# Patient Record
Sex: Male | Born: 1960 | Race: White | Hispanic: No | Marital: Single | Smoking: Current some day smoker
Health system: Southern US, Community
[De-identification: ages and names within clinical notes are randomized; demographics above are authoritative.]

## PROBLEM LIST (undated history)

## (undated) ENCOUNTER — Emergency Department (HOSPITAL_COMMUNITY): Admission: EM | Payer: Self-pay | Source: Home / Self Care

## (undated) DIAGNOSIS — K219 Gastro-esophageal reflux disease without esophagitis: Secondary | ICD-10-CM

## (undated) DIAGNOSIS — E119 Type 2 diabetes mellitus without complications: Secondary | ICD-10-CM

## (undated) DIAGNOSIS — I10 Essential (primary) hypertension: Secondary | ICD-10-CM

## (undated) DIAGNOSIS — I671 Cerebral aneurysm, nonruptured: Secondary | ICD-10-CM

## (undated) DIAGNOSIS — F1910 Other psychoactive substance abuse, uncomplicated: Secondary | ICD-10-CM

## (undated) DIAGNOSIS — T148XXA Other injury of unspecified body region, initial encounter: Secondary | ICD-10-CM

## (undated) DIAGNOSIS — I499 Cardiac arrhythmia, unspecified: Secondary | ICD-10-CM

## (undated) HISTORY — PX: NECK SURGERY: SHX720

## (undated) HISTORY — PX: IMPLANTATION / PLACEMENT OF STRIP ELECTRODES VIA BURR HOLES SUBDURAL: SUR688

## (undated) HISTORY — PX: HERNIA REPAIR: SHX51

---

## 2005-10-16 ENCOUNTER — Emergency Department: Payer: Self-pay | Admitting: Unknown Physician Specialty

## 2005-12-17 ENCOUNTER — Emergency Department: Payer: Self-pay | Admitting: Emergency Medicine

## 2008-09-03 ENCOUNTER — Emergency Department: Payer: Self-pay | Admitting: Emergency Medicine

## 2009-07-08 ENCOUNTER — Inpatient Hospital Stay: Payer: Self-pay | Admitting: Student

## 2009-07-21 ENCOUNTER — Ambulatory Visit: Payer: Self-pay | Admitting: Pain Medicine

## 2009-11-25 ENCOUNTER — Emergency Department: Payer: Self-pay | Admitting: Emergency Medicine

## 2009-11-30 ENCOUNTER — Ambulatory Visit: Payer: Self-pay | Admitting: Cardiology

## 2009-11-30 ENCOUNTER — Ambulatory Visit: Payer: Self-pay | Admitting: Unknown Physician Specialty

## 2009-12-09 ENCOUNTER — Ambulatory Visit: Payer: Self-pay | Admitting: Unknown Physician Specialty

## 2010-03-25 ENCOUNTER — Inpatient Hospital Stay: Payer: Self-pay | Admitting: Internal Medicine

## 2010-04-25 ENCOUNTER — Ambulatory Visit: Payer: Self-pay | Admitting: Unknown Physician Specialty

## 2010-07-03 ENCOUNTER — Emergency Department: Payer: Self-pay | Admitting: Emergency Medicine

## 2012-03-27 DIAGNOSIS — G8929 Other chronic pain: Secondary | ICD-10-CM | POA: Insufficient documentation

## 2012-03-27 DIAGNOSIS — M25512 Pain in left shoulder: Secondary | ICD-10-CM

## 2012-03-27 DIAGNOSIS — M25511 Pain in right shoulder: Secondary | ICD-10-CM

## 2012-09-02 ENCOUNTER — Ambulatory Visit: Payer: Self-pay

## 2012-09-20 DIAGNOSIS — L720 Epidermal cyst: Secondary | ICD-10-CM | POA: Insufficient documentation

## 2012-09-20 DIAGNOSIS — K458 Other specified abdominal hernia without obstruction or gangrene: Secondary | ICD-10-CM | POA: Insufficient documentation

## 2012-12-08 ENCOUNTER — Ambulatory Visit: Payer: Self-pay

## 2012-12-12 ENCOUNTER — Ambulatory Visit: Payer: Self-pay

## 2012-12-21 ENCOUNTER — Ambulatory Visit: Payer: Self-pay | Admitting: Family Medicine

## 2013-09-12 DIAGNOSIS — M25569 Pain in unspecified knee: Secondary | ICD-10-CM | POA: Insufficient documentation

## 2013-10-16 ENCOUNTER — Ambulatory Visit: Payer: Self-pay

## 2014-07-19 DIAGNOSIS — E119 Type 2 diabetes mellitus without complications: Secondary | ICD-10-CM | POA: Insufficient documentation

## 2014-07-19 DIAGNOSIS — M79644 Pain in right finger(s): Secondary | ICD-10-CM | POA: Insufficient documentation

## 2014-07-19 DIAGNOSIS — M5417 Radiculopathy, lumbosacral region: Secondary | ICD-10-CM | POA: Insufficient documentation

## 2015-03-25 ENCOUNTER — Emergency Department
Admission: EM | Admit: 2015-03-25 | Discharge: 2015-03-25 | Disposition: A | Discharge status: Home / Self Care | Payer: Medicaid Other | Attending: Emergency Medicine | Admitting: Emergency Medicine

## 2015-03-25 ENCOUNTER — Encounter: Payer: Self-pay | Admitting: Emergency Medicine

## 2015-03-25 DIAGNOSIS — Z720 Tobacco use: Secondary | ICD-10-CM | POA: Insufficient documentation

## 2015-03-25 DIAGNOSIS — Z79899 Other long term (current) drug therapy: Secondary | ICD-10-CM | POA: Insufficient documentation

## 2015-03-25 DIAGNOSIS — Z794 Long term (current) use of insulin: Secondary | ICD-10-CM | POA: Insufficient documentation

## 2015-03-25 DIAGNOSIS — E1165 Type 2 diabetes mellitus with hyperglycemia: Secondary | ICD-10-CM | POA: Insufficient documentation

## 2015-03-25 DIAGNOSIS — R739 Hyperglycemia, unspecified: Secondary | ICD-10-CM

## 2015-03-25 DIAGNOSIS — I10 Essential (primary) hypertension: Secondary | ICD-10-CM | POA: Insufficient documentation

## 2015-03-25 DIAGNOSIS — R5381 Other malaise: Secondary | ICD-10-CM | POA: Insufficient documentation

## 2015-03-25 HISTORY — DX: Type 2 diabetes mellitus without complications: E11.9

## 2015-03-25 HISTORY — DX: Essential (primary) hypertension: I10

## 2015-03-25 HISTORY — DX: Cerebral aneurysm, nonruptured: I67.1

## 2015-03-25 LAB — CBC
HCT: 47.4 % (ref 40.0–52.0)
HEMOGLOBIN: 15.7 g/dL (ref 13.0–18.0)
MCH: 31.2 pg (ref 26.0–34.0)
MCHC: 33 g/dL (ref 32.0–36.0)
MCV: 94.5 fL (ref 80.0–100.0)
Platelets: 284 10*3/uL (ref 150–440)
RBC: 5.02 MIL/uL (ref 4.40–5.90)
RDW: 13.5 % (ref 11.5–14.5)
WBC: 7.1 10*3/uL (ref 3.8–10.6)

## 2015-03-25 LAB — URINALYSIS COMPLETE WITH MICROSCOPIC (ARMC ONLY)
BILIRUBIN URINE: NEGATIVE
Bacteria, UA: NONE SEEN
Glucose, UA: >500 mg/dL — AB
Hgb urine dipstick: NEGATIVE
Leukocytes, UA: NEGATIVE
Nitrite: NEGATIVE
PROTEIN: NEGATIVE mg/dL
SQUAMOUS EPITHELIAL / LPF: NONE SEEN
Specific Gravity, Urine: 1.025 (ref 1.005–1.030)
pH: 5 (ref 5.0–8.0)

## 2015-03-25 LAB — COMPREHENSIVE METABOLIC PANEL
ALBUMIN: 4.3 g/dL (ref 3.5–5.0)
ALK PHOS: 117 U/L (ref 38–126)
ALT: 18 U/L (ref 17–63)
AST: 20 U/L (ref 15–41)
Anion gap: 13 (ref 5–15)
BILIRUBIN TOTAL: 0.7 mg/dL (ref 0.3–1.2)
BUN: 30 mg/dL — ABNORMAL HIGH (ref 6–20)
CO2: 22 mmol/L (ref 22–32)
Calcium: 9.6 mg/dL (ref 8.9–10.3)
Chloride: 96 mmol/L — ABNORMAL LOW (ref 101–111)
Creatinine, Ser: 1.34 mg/dL — ABNORMAL HIGH (ref 0.61–1.24)
GFR calc Af Amer: >60 mL/min (ref >60)
GFR calc non Af Amer: 58 mL/min — ABNORMAL LOW (ref >60)
Glucose, Bld: 696 mg/dL (ref 65–99)
Potassium: 5 mmol/L (ref 3.5–5.1)
Sodium: 131 mmol/L — ABNORMAL LOW (ref 135–145)
Total Protein: 7.2 g/dL (ref 6.5–8.1)

## 2015-03-25 LAB — GLUCOSE, CAPILLARY
GLUCOSE-CAPILLARY: 169 mg/dL — AB (ref 65–99)
GLUCOSE-CAPILLARY: 566 mg/dL — AB (ref 65–99)
Glucose-Capillary: >600 mg/dL (ref 65–99)
Glucose-Capillary: 167 mg/dL — ABNORMAL HIGH (ref 65–99)
Glucose-Capillary: 222 mg/dL — ABNORMAL HIGH (ref 65–99)

## 2015-03-25 MED ORDER — INSULIN ASPART 100 UNIT/ML ~~LOC~~ SOLN
15.0000 [IU] | Freq: Once | SUBCUTANEOUS | Status: AC
  Administered 2015-03-25: 15 [IU] via INTRAVENOUS

## 2015-03-25 MED ORDER — IBUPROFEN 600 MG PO TABS: 600.0000 mg | ORAL_TABLET | Freq: Three times a day (TID) | ORAL | Status: DC | PRN

## 2015-03-25 MED ORDER — INSULIN ASPART 100 UNIT/ML ~~LOC~~ SOLN
SUBCUTANEOUS | Status: AC
  Administered 2015-03-25: 15 [IU] via INTRAVENOUS
  Filled 2015-03-25: qty 15

## 2015-03-25 MED ORDER — SODIUM CHLORIDE 0.9 % IV BOLUS (SEPSIS)
1000.0000 mL | Freq: Once | INTRAVENOUS | Status: AC
  Administered 2015-03-25: 1000 mL via INTRAVENOUS

## 2015-03-25 NOTE — ED Notes (Signed)
Pt reports bs over 600 for several weeks.  States then he went to jail and didn't follow up.  bs still high

## 2015-03-25 NOTE — Discharge Instructions (Signed)
Please seek medical attention for any high fevers, chest pain, shortness of breath, change in behavior, persistent vomiting, bloody stool or any other new or concerning symptoms. ° °Hyperglycemia °Hyperglycemia occurs when the glucose (sugar) in your blood is too high. Hyperglycemia can happen for many reasons, but it most often happens to people who do not know they have diabetes or are not managing their diabetes properly.  °CAUSES  °Whether you have diabetes or not, there are other causes of hyperglycemia. Hyperglycemia can occur when you have diabetes, but it can also occur in other situations that you might not be as aware of, such as: °Diabetes °· If you have diabetes and are having problems controlling your blood glucose, hyperglycemia could occur because of some of the following reasons: °¨ Not following your meal plan. °¨ Not taking your diabetes medications or not taking it properly. °¨ Exercising less or doing less activity than you normally do. °¨ Being sick. °Pre-diabetes °· This cannot be ignored. Before people develop Type 2 diabetes, they almost always have "pre-diabetes." This is when your blood glucose levels are higher than normal, but not yet high enough to be diagnosed as diabetes. Research has shown that some long-term damage to the body, especially the heart and circulatory system, may already be occurring during pre-diabetes. If you take action to manage your blood glucose when you have pre-diabetes, you may delay or prevent Type 2 diabetes from developing. °Stress °· If you have diabetes, you may be "diet" controlled or on oral medications or insulin to control your diabetes. However, you may find that your blood glucose is higher than usual in the hospital whether you have diabetes or not. This is often referred to as "stress hyperglycemia." Stress can elevate your blood glucose. This happens because of hormones put out by the body during times of stress. If stress has been the cause of  your high blood glucose, it can be followed regularly by your caregiver. That way he/she can make sure your hyperglycemia does not continue to get worse or progress to diabetes. °Steroids °· Steroids are medications that act on the infection fighting system (immune system) to block inflammation or infection. One side effect can be a rise in blood glucose. Most people can produce enough extra insulin to allow for this rise, but for those who cannot, steroids make blood glucose levels go even higher. It is not unusual for steroid treatments to "uncover" diabetes that is developing. It is not always possible to determine if the hyperglycemia will go away after the steroids are stopped. A special blood test called an A1c is sometimes done to determine if your blood glucose was elevated before the steroids were started. °SYMPTOMS °· Thirsty. °· Frequent urination. °· Dry mouth. °· Blurred vision. °· Tired or fatigue. °· Weakness. °· Sleepy. °· Tingling in feet or leg. °DIAGNOSIS  °Diagnosis is made by monitoring blood glucose in one or all of the following ways: °· A1c test. This is a chemical found in your blood. °· Fingerstick blood glucose monitoring. °· Laboratory results. °TREATMENT  °First, knowing the cause of the hyperglycemia is important before the hyperglycemia can be treated. Treatment may include, but is not be limited to: °· Education. °· Change or adjustment in medications. °· Change or adjustment in meal plan. °· Treatment for an illness, infection, etc. °· More frequent blood glucose monitoring. °· Change in exercise plan. °· Decreasing or stopping steroids. °· Lifestyle changes. °HOME CARE INSTRUCTIONS  °· Test your blood glucose   as directed. °· Exercise regularly. Your caregiver will give you instructions about exercise. Pre-diabetes or diabetes which comes on with stress is helped by exercising. °· Eat wholesome, balanced meals. Eat often and at regular, fixed times. Your caregiver or nutritionist  will give you a meal plan to guide your sugar intake. °· Being at an ideal weight is important. If needed, losing as little as 10 to 15 pounds may help improve blood glucose levels. °SEEK MEDICAL CARE IF:  °· You have questions about medicine, activity, or diet. °· You continue to have symptoms (problems such as increased thirst, urination, or weight gain). °SEEK IMMEDIATE MEDICAL CARE IF:  °· You are vomiting or have diarrhea. °· Your breath smells fruity. °· You are breathing faster or slower. °· You are very sleepy or incoherent. °· You have numbness, tingling, or pain in your feet or hands. °· You have chest pain. °· Your symptoms get worse even though you have been following your caregiver's orders. °· If you have any other questions or concerns. °Document Released: 04/18/2001 Document Revised: 01/15/2012 Document Reviewed: 02/19/2012 °[employer]® Patient Information ©2015 [employer]. This information is not intended to replace advice given to you by your health care provider. Make sure you discuss any questions you have with your health care provider. ° °

## 2015-03-25 NOTE — ED Provider Notes (Signed)
[facility] Emergency Department Provider Note    ____________________________________________  Time seen: 1015  I have reviewed the triage vital signs and the nursing notes.   HISTORY  Chief Complaint Hyperglycemia   History limited by: Not Limited   HPI Spencer [name] is a 54 y.o. male who presents to the emergency department with concerns for hyperglycemia. Patient states his blood sugars have been over 600 for the past 2-3 weeks. Patient states he has a history of heart control blood sugar and has been hospitalized in the past for elevated blood sugar. He states he is currently taking 52 units of Humalog both morning and night for his on top of this patient states he started to have some blurry vision and started feeling unwell. He had subjective fevers.     Past Medical History  Diagnosis Date  . Diabetes mellitus without complication   . Hypertension   . Brain aneurysm     There are no active problems to display for this patient.   Past Surgical History  Procedure Laterality Date  . Neck surgery    . Hernia repair      Current Outpatient Rx  Name  Route  Sig  Dispense  Refill  . hydrochlorothiazide (MICROZIDE) 12.5 MG capsule   Oral   Take 1 capsule by mouth daily.         . insulin aspart (NOVOLOG) 100 UNIT/ML FlexPen   Subcutaneous   Inject into the skin.         Marland Kitchen. insulin glargine (LANTUS) 100 UNIT/ML injection   Subcutaneous   Inject 52 Units into the skin 2 (two) times daily. 50 units subcutaneously twice a day         . lisinopril (PRINIVIL,ZESTRIL) 20 MG tablet   Oral   Take 1 tablet by mouth daily.         . traMADol (ULTRAM) 50 MG tablet      Take 1 or 2 tabs by mouth q6 hrs prn         . ibuprofen (ADVIL,MOTRIN) 600 MG tablet   Oral   Take 1 tablet (600 mg total) by mouth every 8 (eight) hours as needed.   20 tablet   0     Allergies Review of patient's allergies indicates no known  allergies.  History reviewed. No pertinent family history.  Social History History  Substance Use Topics  . Smoking status: Current Every Day Smoker  . Smokeless tobacco: Not on file  . Alcohol Use: Yes    Review of Systems  Constitutional: Subjective fever Cardiovascular: Negative for chest pain. Respiratory: Negative for shortness of breath. Gastrointestinal: Negative for abdominal pain, vomiting and diarrhea. Genitourinary: Negative for dysuria. Musculoskeletal: Negative for back pain. Skin: Negative for rash. Neurological: Negative for headaches, focal weakness or numbness.   10-point ROS otherwise negative.  ____________________________________________   PHYSICAL EXAM:  VITAL SIGNS: ED Triage Vitals  Enc Vitals Group     BP 03/25/15 0957 137/81 mmHg     Pulse Rate 03/25/15 0957 77     Resp 03/25/15 0957 20     Temp 03/25/15 0957 98.3 F (36.8 C)     Temp Source 03/25/15 0957 Oral     SpO2 03/25/15 0957 98 %     Weight 03/25/15 0957 175 lb (79.379 kg)     Height 03/25/15 0957 6' (1.829 m)   Constitutional: Alert and oriented. Well appearing and in no distress. Eyes: Conjunctivae are normal. PERRL. Normal extraocular  movements. ENT   Head: Normocephalic and atraumatic.   Nose: No congestion/rhinnorhea.   Mouth/Throat: Mucous membranes are moist.   Neck: No stridor. Hematological/Lymphatic/Immunilogical: No cervical lymphadenopathy. Cardiovascular: Normal rate, regular rhythm.  No murmurs, rubs, or gallops. Respiratory: Normal respiratory effort without tachypnea nor retractions. Breath sounds are clear and equal bilaterally. No wheezes/rales/rhonchi. Gastrointestinal: Soft and nontender. No distention. There is no CVA tenderness. Genitourinary: Deferred Musculoskeletal: Normal range of motion in all extremities. No joint effusions.  No lower extremity tenderness nor edema. Neurologic:  Normal speech and language. No gross focal neurologic  deficits are appreciated. Speech is normal.  Skin:  Skin is warm, dry and intact. No rash noted. Psychiatric: Mood and affect are normal. Speech and behavior are normal. Patient exhibits appropriate insight and judgment.  ____________________________________________    LABS (pertinent positives/negatives)  Labs Reviewed  COMPREHENSIVE METABOLIC PANEL - Abnormal; Notable for the following:    Sodium 131 (*)    Chloride 96 (*)    Glucose, Bld 696 (*)    BUN 30 (*)    Creatinine, Ser 1.34 (*)    GFR calc non Af Amer 58 (*)    All other components within normal limits  URINALYSIS COMPLETEWITH MICROSCOPIC ([facility])  - Abnormal; Notable for the following:    Color, Urine STRAW (*)    APPearance CLEAR (*)    Glucose, UA >500 (*)    Ketones, ur 1+ (*)    All other components within normal limits  GLUCOSE, CAPILLARY - Abnormal; Notable for the following:    Glucose-Capillary >600 (*)    All other components within normal limits  GLUCOSE, CAPILLARY - Abnormal; Notable for the following:    Glucose-Capillary >600 (*)    All other components within normal limits  GLUCOSE, CAPILLARY - Abnormal; Notable for the following:    Glucose-Capillary 167 (*)    All other components within normal limits  GLUCOSE, CAPILLARY - Abnormal; Notable for the following:    Glucose-Capillary 169 (*)    All other components within normal limits  GLUCOSE, CAPILLARY - Abnormal; Notable for the following:    Glucose-Capillary 222 (*)    All other components within normal limits  CBC  CBG MONITORING, ED     ____________________________________________   EKG  None  ____________________________________________    RADIOLOGY  None  ____________________________________________   PROCEDURES  Procedure(s) performed: None  Critical Care performed: No  ____________________________________________   INITIAL IMPRESSION / ASSESSMENT AND PLAN / ED COURSE  Pertinent labs & imaging results that  were available during my care of the patient were reviewed by me and considered in my medical decision making (see chart for details).  Patient here with concerns for hyperglycemia. Will check blood work, make sure patient not in DKA. Will give fluids and if potassium allows IV insulin.  ----------------------------------------- 3:29 PM on 03/25/2015 -----------------------------------------  Should sugars have now been in a much more acceptable range. No anion gap. Patient does state he feels better now that his sugars are low. This point will plan on discharge patient home. Encourage primary care follow-up. discussedreturn precautions.  ____________________________________________   FINAL CLINICAL IMPRESSION(S) / ED DIAGNOSES  Final diagnoses:  Hyperglycemia     Phineas SemenGraydon Jillien Yakel, MD 03/25/15 1530

## 2015-03-25 NOTE — ED Notes (Signed)
Pt discharged home after verbalizing understanding of discharge instructions; nad noted. 

## 2015-03-25 NOTE — ED Notes (Signed)
Pt presents to ED with elevated blood sugar. Pt states he was seen in this ED earlier today for the same and was treated with fluids and released. Pt reports his sugar has never been under control since he was diagnosed but has been worse the past 3 weeks. Has not been able to make an appt with his pcp. Pt alert and talkative. States him MD told him is body "seems to be rejecting all his insulin". No increased work of breathing or acute distress noted at this time.

## 2015-03-26 ENCOUNTER — Emergency Department
Admission: EM | Admit: 2015-03-26 | Discharge: 2015-03-26 | Disposition: A | Discharge status: Home / Self Care | Payer: Medicaid Other | Source: Home / Self Care | Attending: Emergency Medicine | Admitting: Emergency Medicine

## 2015-03-26 DIAGNOSIS — E1165 Type 2 diabetes mellitus with hyperglycemia: Secondary | ICD-10-CM

## 2015-03-26 DIAGNOSIS — R739 Hyperglycemia, unspecified: Secondary | ICD-10-CM

## 2015-03-26 LAB — URINALYSIS COMPLETE WITH MICROSCOPIC (ARMC ONLY)
Bacteria, UA: NONE SEEN
Bilirubin Urine: NEGATIVE
Glucose, UA: >500 mg/dL — AB
Hgb urine dipstick: NEGATIVE
Leukocytes, UA: NEGATIVE
NITRITE: NEGATIVE
PROTEIN: NEGATIVE mg/dL
SQUAMOUS EPITHELIAL / LPF: NONE SEEN
Specific Gravity, Urine: 1.025 (ref 1.005–1.030)
WBC, UA: NONE SEEN WBC/hpf (ref 0–5)
pH: 6 (ref 5.0–8.0)

## 2015-03-26 LAB — COMPREHENSIVE METABOLIC PANEL
ALT: 16 U/L — ABNORMAL LOW (ref 17–63)
ANION GAP: 10 (ref 5–15)
AST: 20 U/L (ref 15–41)
Albumin: 3.4 g/dL — ABNORMAL LOW (ref 3.5–5.0)
Alkaline Phosphatase: 90 U/L (ref 38–126)
BUN: 27 mg/dL — AB (ref 6–20)
CO2: 21 mmol/L — ABNORMAL LOW (ref 22–32)
Calcium: 8.5 mg/dL — ABNORMAL LOW (ref 8.9–10.3)
Chloride: 103 mmol/L (ref 101–111)
Creatinine, Ser: 1.24 mg/dL (ref 0.61–1.24)
GFR calc Af Amer: >60 mL/min (ref >60)
GFR calc non Af Amer: >60 mL/min (ref >60)
Glucose, Bld: 502 mg/dL (ref 65–99)
POTASSIUM: 4.4 mmol/L (ref 3.5–5.1)
Sodium: 134 mmol/L — ABNORMAL LOW (ref 135–145)
Total Bilirubin: 0.4 mg/dL (ref 0.3–1.2)
Total Protein: 5.8 g/dL — ABNORMAL LOW (ref 6.5–8.1)

## 2015-03-26 LAB — GLUCOSE, CAPILLARY: GLUCOSE-CAPILLARY: 347 mg/dL — AB (ref 65–99)

## 2015-03-26 LAB — CBC WITH DIFFERENTIAL/PLATELET
Basophils Absolute: 0 10*3/uL (ref 0–0.1)
Basophils Relative: 1 %
Eosinophils Absolute: 0.1 10*3/uL (ref 0–0.7)
Eosinophils Relative: 2 %
HEMATOCRIT: 41.4 % (ref 40.0–52.0)
Hemoglobin: 14.2 g/dL (ref 13.0–18.0)
Lymphocytes Relative: 23 %
Lymphs Abs: 1.6 10*3/uL (ref 1.0–3.6)
MCH: 32.1 pg (ref 26.0–34.0)
MCHC: 34.3 g/dL (ref 32.0–36.0)
MCV: 93.4 fL (ref 80.0–100.0)
MONO ABS: 0.6 10*3/uL (ref 0.2–1.0)
Monocytes Relative: 8 %
NEUTROS PCT: 66 %
Neutro Abs: 4.8 10*3/uL (ref 1.4–6.5)
Platelets: 242 10*3/uL (ref 150–440)
RBC: 4.44 MIL/uL (ref 4.40–5.90)
RDW: 13.4 % (ref 11.5–14.5)
WBC: 7.2 10*3/uL (ref 3.8–10.6)

## 2015-03-26 MED ORDER — SODIUM CHLORIDE 0.9 % IV BOLUS (SEPSIS)
1000.0000 mL | Freq: Once | INTRAVENOUS | Status: AC
  Administered 2015-03-26: 1000 mL via INTRAVENOUS

## 2015-03-26 MED ORDER — INSULIN ASPART 100 UNIT/ML ~~LOC~~ SOLN
10.0000 [IU] | Freq: Once | SUBCUTANEOUS | Status: AC
  Administered 2015-03-26: 10 [IU] via SUBCUTANEOUS

## 2015-03-26 MED ORDER — SITAGLIPTIN PHOSPHATE 100 MG PO TABS: 100.0000 mg | ORAL_TABLET | Freq: Every day | ORAL | Status: DC

## 2015-03-26 MED ORDER — INSULIN ASPART 100 UNIT/ML ~~LOC~~ SOLN
SUBCUTANEOUS | Status: AC
  Filled 2015-03-26: qty 10

## 2015-03-26 NOTE — ED Provider Notes (Signed)
[facility] Emergency Department Provider Note  ____________________________________________  Time seen: Approximately 1:49 AM  I have reviewed the triage vital signs and the nursing notes.   HISTORY  Chief Complaint Hyperglycemia    HPI Spencer [name] is a 54 y.o. male who returns to the ED after his visit yesterday morning for persistent hyperglycemia. Patient has poorly controlled blood sugars at baseline, worse over the past 2-3 weeks. States yesterday he took over 100 units of insulin which did not control his blood sugars. Admits he is supposed to be taking metformin; however, he discontinued this several months ago due to the side effect of erectile dysfunction. Patient complains of generalized malaise, blurry vision, polydipsia and polyuria when his sugars are high. Patient denies fever, chills, chest pain, shortness of breath, abdominal pain, nausea, vomiting, diarrhea, headache, numbness, tingling, numbness.    Past Medical History  Diagnosis Date  . Diabetes mellitus without complication   . Hypertension   . Brain aneurysm     There are no active problems to display for this patient.   Past Surgical History  Procedure Laterality Date  . Neck surgery    . Hernia repair      Current Outpatient Rx  Name  Route  Sig  Dispense  Refill  . hydrochlorothiazide (MICROZIDE) 12.5 MG capsule   Oral   Take 1 capsule by mouth daily.         Marland Kitchen. ibuprofen (ADVIL,MOTRIN) 600 MG tablet   Oral   Take 1 tablet (600 mg total) by mouth every 8 (eight) hours as needed.   20 tablet   0   . insulin aspart (NOVOLOG) 100 UNIT/ML FlexPen   Subcutaneous   Inject into the skin.         Marland Kitchen. insulin glargine (LANTUS) 100 UNIT/ML injection   Subcutaneous   Inject 52 Units into the skin 2 (two) times daily. 50 units subcutaneously twice a day         . lisinopril (PRINIVIL,ZESTRIL) 20 MG tablet   Oral   Take 1 tablet by mouth daily.         . sitaGLIPtin  (JANUVIA) 100 MG tablet   Oral   Take 1 tablet (100 mg total) by mouth daily.   30 tablet   0   . traMADol (ULTRAM) 50 MG tablet      Take 1 or 2 tabs by mouth q6 hrs prn           Allergies Review of patient's allergies indicates no known allergies.  No family history on file.  Social History History  Substance Use Topics  . Smoking status: Current Every Day Smoker -- 0.50 packs/day    Types: Cigarettes  . Smokeless tobacco: Not on file  . Alcohol Use: Yes    Review of Systems Constitutional: No fever/chills Eyes: Positive for blurry vision. ENT: No sore throat. Cardiovascular: Denies chest pain. Respiratory: Denies shortness of breath. Gastrointestinal: No abdominal pain.  No nausea, no vomiting.  No diarrhea.  No constipation. Genitourinary: Negative for dysuria. Positive for polyuria. Musculoskeletal: Negative for back pain. Skin: Negative for rash. Neurological: Negative for headaches, focal weakness or numbness.  10-point ROS otherwise negative.  ____________________________________________   PHYSICAL EXAM:  VITAL SIGNS: ED Triage Vitals  Enc Vitals Group     BP 03/25/15 2222 134/77 mmHg     Pulse Rate 03/25/15 2222 84     Resp 03/25/15 2222 20     Temp 03/25/15 2222 97.5 F (  36.4 C)     Temp Source 03/25/15 2222 Oral     SpO2 03/25/15 2222 99 %     Weight 03/25/15 2222 180 lb (81.647 kg)     Height 03/25/15 2222 6' (1.829 m)     Head Cir --      Peak Flow --      Pain Score 03/25/15 2229 10     Pain Loc --      Pain Edu? --      Excl. in GC? --     Constitutional: Alert and oriented. Well appearing and in no acute distress. Eyes: Conjunctivae are normal. PERRL. EOMI. Head: Atraumatic. Nose: No congestion/rhinnorhea. Mouth/Throat: Mucous membranes are moist.  Oropharynx non-erythematous. Neck: No stridor.   Cardiovascular: Normal rate, regular rhythm. Grossly normal heart sounds.  Good peripheral circulation. Respiratory: Normal  respiratory effort.  No retractions. Lungs CTAB. Gastrointestinal: Soft and nontender. No distention. No abdominal bruits. No CVA tenderness. Musculoskeletal: No lower extremity tenderness nor edema.  No joint effusions. Neurologic:  Normal speech and language. No gross focal neurologic deficits are appreciated. Speech is normal. No gait instability. Skin:  Skin is warm, dry and intact. No rash noted. Psychiatric: Mood and affect are normal. Speech and behavior are normal.  ____________________________________________   LABS (all labs ordered are listed, but only abnormal results are displayed)  Labs Reviewed  GLUCOSE, CAPILLARY - Abnormal; Notable for the following:    Glucose-Capillary 566 (*)    All other components within normal limits  COMPREHENSIVE METABOLIC PANEL - Abnormal; Notable for the following:    Sodium 134 (*)    CO2 21 (*)    Glucose, Bld 502 (*)    BUN 27 (*)    Calcium 8.5 (*)    Total Protein 5.8 (*)    Albumin 3.4 (*)    ALT 16 (*)    All other components within normal limits  URINALYSIS COMPLETEWITH MICROSCOPIC ([facility])  - Abnormal; Notable for the following:    Color, Urine COLORLESS (*)    APPearance CLEAR (*)    Glucose, UA >500 (*)    Ketones, ur 1+ (*)    All other components within normal limits  GLUCOSE, CAPILLARY - Abnormal; Notable for the following:    Glucose-Capillary 347 (*)    All other components within normal limits  CBC WITH DIFFERENTIAL/PLATELET  CBG MONITORING, ED   ____________________________________________  EKG  None ____________________________________________  RADIOLOGY  None ____________________________________________   PROCEDURES  Procedure(s) performed: None  Critical Care performed: No  ____________________________________________   INITIAL IMPRESSION / ASSESSMENT AND PLAN / ED COURSE  Pertinent labs & imaging results that were available during my care of the patient were reviewed by me and considered  in my medical decision making (see chart for details).  54 year old male who returns to the ED for persistent hyperglycemia. He was treated yesterday morning with IV fluids and regular insulin. Patient did not take additional insulin after discharge from the ED. Will continue IV fluids, subcutaneous regular insulin. Will speak with hospitalist regarding recommendations for continued diabetes care.  ----------------------------------------- 4:06 AM on 03/26/2015 -----------------------------------------  Blood sugar improved. Patient resting in no acute distress. Discussed with Dr. Sheryle Hailiamond; will initiate Januvia 100 mg daily. No evidence of DKA. Will refer patient to endocrinology. Strict return precautions given. Patient verbalizes understanding and agree with plan of care. ____________________________________________   FINAL CLINICAL IMPRESSION(S) / ED DIAGNOSES  Final diagnoses:  Hyperglycemia  Type 2 diabetes mellitus with hyperglycemia  Irean Hong, MD 03/26/15 [phone]

## 2015-03-26 NOTE — ED Notes (Signed)
Pt reports being d/c'd from this ED "a few hours ago." Pt reports his BS read "high" shortly after he got home. Pt states " Ya'll should not have sent me home.". Pt denies taking any insulin to cover his hyperglycemia.

## 2015-03-26 NOTE — ED Notes (Signed)
Pt in bed with eyes closed, no distress noted, no needs identified at this time

## 2015-03-26 NOTE — Discharge Instructions (Signed)
1. Start Januvia 100 mg by mouth daily. 2. Continue insulin as directed by your doctor. 3. Return to the ER for worsening symptoms, persistent vomiting, difficulty breathing or other concerns.  High Blood Sugar High blood sugar (hyperglycemia) means that the level of sugar in your blood is higher than it should be. Signs of high blood sugar include:  Feeling thirsty.  Frequent peeing (urinating).  Feeling tired or sleepy.  Dry mouth.  Vision changes.  Feeling weak.  Feeling hungry but losing weight.  Numbness and tingling in your hands or feet.  Headache. When you ignore these signs, your blood sugar may keep going up. These problems may get worse, and other problems may begin. HOME CARE  Check your blood sugars as told by your doctor. Write down the numbers with the date and time.  Take the right amount of insulin or diabetes pills at the right time. Write down the dose with date and time.  Refill your insulin or diabetes pills before running out.  Watch what you eat. Follow your meal plan.  Drink liquids without sugar, such as water. Check with your doctor if you have kidney or heart disease.  Follow your doctor's orders for exercise. Exercise at the same time of day.  Keep your doctor's appointments. GET HELP RIGHT AWAY IF:   You have trouble thinking or are confused.  You have fast breathing with fruity smelling breath.  You pass out (faint).  You have 2 to 3 days of high blood sugars and you do not know why.  You have chest pain.  You are feeling sick to your stomach (nauseous) or throwing up (vomiting).  You have sudden vision changes. MAKE SURE YOU:   Understand these instructions.  Will watch your condition.  Will get help right away if you are not doing well or get worse. Document Released: 08/20/2009 Document Revised: 01/15/2012 Document Reviewed: 08/20/2009 [facility] Patient Information 2015 [place], [place]. This information is not intended to  replace advice given to you by your health care provider. Make sure you discuss any questions you have with your health care provider.  Diabetes Mellitus and Food It is important for you to manage your blood sugar (glucose) level. Your blood glucose level can be greatly affected by what you eat. Eating healthier foods in the appropriate amounts throughout the day at about the same time each day will help you control your blood glucose level. It can also help slow or prevent worsening of your diabetes mellitus. Healthy eating may even help you improve the level of your blood pressure and reach or maintain a healthy weight.  HOW CAN FOOD AFFECT ME? Carbohydrates Carbohydrates affect your blood glucose level more than any other type of food. Your dietitian will help you determine how many carbohydrates to eat at each meal and teach you how to count carbohydrates. Counting carbohydrates is important to keep your blood glucose at a healthy level, especially if you are using insulin or taking certain medicines for diabetes mellitus. Alcohol Alcohol can cause sudden decreases in blood glucose (hypoglycemia), especially if you use insulin or take certain medicines for diabetes mellitus. Hypoglycemia can be a life-threatening condition. Symptoms of hypoglycemia (sleepiness, dizziness, and disorientation) are similar to symptoms of having too much alcohol.  If your health care provider has given you approval to drink alcohol, do so in moderation and use the following guidelines:  Women should not have more than one drink per day, and men should not have more than two  drinks per day. One drink is equal to:  12 oz of beer.  5 oz of wine.  1 oz of hard liquor.  Do not drink on an empty stomach.  Keep yourself hydrated. Have water, diet soda, or unsweetened iced tea.  Regular soda, juice, and other mixers might contain a lot of carbohydrates and should be counted. WHAT FOODS ARE NOT RECOMMENDED? As you  make food choices, it is important to remember that all foods are not the same. Some foods have fewer nutrients per serving than other foods, even though they might have the same number of calories or carbohydrates. It is difficult to get your body what it needs when you eat foods with fewer nutrients. Examples of foods that you should avoid that are high in calories and carbohydrates but low in nutrients include:  Trans fats (most processed foods list trans fats on the Nutrition Facts label).  Regular soda.  Juice.  Candy.  Sweets, such as cake, pie, doughnuts, and cookies.  Fried foods. WHAT FOODS CAN I EAT? Have nutrient-rich foods, which will nourish your body and keep you healthy. The food you should eat also will depend on several factors, including:  The calories you need.  The medicines you take.  Your weight.  Your blood glucose level.  Your blood pressure level.  Your cholesterol level. You also should eat a variety of foods, including:  Protein, such as meat, poultry, fish, tofu, nuts, and seeds (lean animal proteins are best).  Fruits.  Vegetables.  Dairy products, such as milk, cheese, and yogurt (low fat is best).  Breads, grains, pasta, cereal, rice, and beans.  Fats such as olive oil, trans fat-free margarine, canola oil, avocado, and olives. DOES EVERYONE WITH DIABETES MELLITUS HAVE THE SAME MEAL PLAN? Because every person with diabetes mellitus is different, there is not one meal plan that works for everyone. It is very important that you meet with a dietitian who will help you create a meal plan that is just right for you. Document Released: 07/20/2005 Document Revised: 10/28/2013 Document Reviewed: 09/19/2013 [facility] Patient Information 2015 [place], [place]. This information is not intended to replace advice given to you by your health care provider. Make sure you discuss any questions you have with your health care provider.  Type 2 Diabetes  Mellitus Type 2 diabetes mellitus is a long-term (chronic) disease. In type 2 diabetes:  The pancreas does not make enough of a hormone called insulin.  The cells in the body do not respond as well to the insulin that is made.  Both of the above can happen. Normally, insulin moves sugars from food into tissue cells. This gives you energy. If you have type 2 diabetes, sugars cannot be moved into tissue cells. This causes high blood sugar (hyperglycemia).  HOME CARE  Have your hemoglobin A1c level checked twice a year. The level shows if your diabetes is under control or out of control.  Test your blood sugar level every day as told by your doctor.  Check your ketone levels by testing your pee (urine) when you are sick and as told.  Take your diabetes or insulin medicine as told by your doctor.  Never run out of insulin.  Adjust how much insulin you give yourself based on how many carbs (carbohydrates) you eat. Carbs are in many foods, such as fruits, vegetables, whole grains, and dairy products.  Have a healthy snack between every healthy meal. Have 3 meals and 3 snacks a day.  Lose weight if you are overweight.  Carry a medical alert card or wear your medical alert jewelry.  Carry a 15-gram carb snack with you at all times. Examples include:  Glucose pills, 3 or 4.  Glucose gel, 15-gram tube.  Raisins, 2 tablespoons (24 grams).  Jelly beans, 6.  Animal crackers, 8.  Regular (not diet) pop, 4 ounces (120 milliliters).  Gummy treats, 9.  Notice low blood sugar (hypoglycemia) symptoms, such as:  Shaking (tremors).  Trouble thinking clearly.  Sweating.  Faster heart rate.  Headache.  Dry mouth.  Hunger.  Crabbiness (irritability).  Being worried or tense (anxious).  Restless sleep.  A change in speech or coordination.  Confusion.  Treat low blood sugar right away. If you are alert and can swallow, follow the 15:15 rule:  Take 15-20 grams of a  rapid-acting glucose or carb. This includes glucose gel, glucose pills, or 4 ounces (120 milliliters) of fruit juice, regular pop, or low-fat milk.  Check your blood sugar level 15 minutes after taking the glucose.  Take 15-20 grams more of glucose if the repeat blood sugar level is still 70 mg/dL (milligrams/deciliter) or below.  Eat a meal or snack within 1 hour of the blood sugar levels going back to normal.  Notice early symptoms of high blood sugar, such as:  Being really thirsty or drinking a lot (polydipsia).  Peeing a lot (polyuria).  Do at least 150 minutes of physical activity a week or as told.  Split the 150 minutes of activity up during the week. Do not do 150 minutes of activity in one day.  Perform exercises, such as weight lifting, at least 2 times a week or as told.  Spend no more than 90 minutes at one time inactive.  Adjust your insulin or food intake as needed if you start a new exercise or sport.  Follow your sick-day plan when you are not able to eat or drink as usual.  Do not smoke, chew tobacco, or use electronic cigarettes.  Women who are not pregnant should drink no more than 1 drink a day. Men should drink no more than 2 drinks a day.  Only drink alcohol with food.  Ask your doctor if alcohol is safe for you.  Tell your doctor if you drink alcohol several times during the week.  See your doctor regularly.  Schedule an eye exam soon after you are told you have diabetes. Schedule exams once every year.  Check your skin and feet every day. Check for cuts, bruises, redness, nail problems, bleeding, blisters, or sores. A doctor should do a foot exam once a year.  Brush your teeth and gums twice a day. Floss once a day. Visit your dentist regularly.  Share your diabetes plan with your workplace or school.  Stay up-to-date with shots that fight against diseases (immunizations).  Learn how to deal with stress.  Get diabetes education and support as  needed.  Ask your doctor for special help if:  You need help to maintain or improve how you do things on your own.  You need help to maintain or improve the quality of your life.  You have foot or hand problems.  You have trouble cleaning yourself, dressing, eating, or doing physical activity. GET HELP IF:  You are unable to eat or drink for more than 6 hours.  You feel sick to your stomach (nauseous) or throw up (vomit) for more than 6 hours.  Your blood sugar level is over  240 mg/dL.  There is a change in mental status.  You get another serious illness.  You have watery poop (diarrhea) for more than 6 hours.  You have been sick or have had a fever for 2 or more days and are not getting better.  You have pain when you are active. GET HELP RIGHT AWAY IF:  You have trouble breathing.  Your ketone levels are higher than your doctor says they should be. MAKE SURE YOU:  Understand these instructions.  Will watch your condition.  Will get help right away if you are not doing well or get worse. Document Released: 08/01/2008 Document Revised: 03/09/2014 Document Reviewed: 05/24/2012 [facility] Patient Information 2015 [place], [place]. This information is not intended to replace advice given to you by your health care provider. Make sure you discuss any questions you have with your health care provider.

## 2015-03-31 ENCOUNTER — Other Ambulatory Visit: Payer: Self-pay

## 2015-03-31 ENCOUNTER — Emergency Department
Admission: EM | Admit: 2015-03-31 | Discharge: 2015-04-01 | Disposition: A | Discharge status: Home / Self Care | Payer: Medicaid Other | Attending: Emergency Medicine | Admitting: Emergency Medicine

## 2015-03-31 DIAGNOSIS — I10 Essential (primary) hypertension: Secondary | ICD-10-CM | POA: Diagnosis not present

## 2015-03-31 DIAGNOSIS — E1165 Type 2 diabetes mellitus with hyperglycemia: Secondary | ICD-10-CM

## 2015-03-31 DIAGNOSIS — Z79899 Other long term (current) drug therapy: Secondary | ICD-10-CM | POA: Diagnosis not present

## 2015-03-31 DIAGNOSIS — R739 Hyperglycemia, unspecified: Secondary | ICD-10-CM | POA: Diagnosis present

## 2015-03-31 DIAGNOSIS — Z794 Long term (current) use of insulin: Secondary | ICD-10-CM | POA: Diagnosis not present

## 2015-03-31 DIAGNOSIS — Z720 Tobacco use: Secondary | ICD-10-CM | POA: Insufficient documentation

## 2015-03-31 DIAGNOSIS — IMO0002 Reserved for concepts with insufficient information to code with codable children: Secondary | ICD-10-CM

## 2015-03-31 DIAGNOSIS — E119 Type 2 diabetes mellitus without complications: Secondary | ICD-10-CM | POA: Insufficient documentation

## 2015-03-31 LAB — CBC
HEMATOCRIT: 41.2 % (ref 40.0–52.0)
Hemoglobin: 13.9 g/dL (ref 13.0–18.0)
MCH: 32.1 pg (ref 26.0–34.0)
MCHC: 33.9 g/dL (ref 32.0–36.0)
MCV: 94.7 fL (ref 80.0–100.0)
Platelets: 187 10*3/uL (ref 150–440)
RBC: 4.35 MIL/uL — AB (ref 4.40–5.90)
RDW: 13 % (ref 11.5–14.5)
WBC: 6.7 10*3/uL (ref 3.8–10.6)

## 2015-03-31 LAB — URINALYSIS COMPLETE WITH MICROSCOPIC (ARMC ONLY)
BACTERIA UA: NONE SEEN
Bilirubin Urine: NEGATIVE
HGB URINE DIPSTICK: NEGATIVE
LEUKOCYTES UA: NEGATIVE
Nitrite: NEGATIVE
Protein, ur: NEGATIVE mg/dL
RBC / HPF: NONE SEEN RBC/hpf (ref 0–5)
SQUAMOUS EPITHELIAL / LPF: NONE SEEN
Specific Gravity, Urine: 1.027 (ref 1.005–1.030)
WBC, UA: NONE SEEN WBC/hpf (ref 0–5)
pH: 6 (ref 5.0–8.0)

## 2015-03-31 LAB — GLUCOSE, CAPILLARY

## 2015-03-31 MED ORDER — SODIUM CHLORIDE 0.9 % IV SOLN: 1000.0000 mL | INTRAVENOUS | Status: DC

## 2015-03-31 MED ORDER — SODIUM CHLORIDE 0.9 % IV SOLN
1000.0000 mL | Freq: Once | INTRAVENOUS | Status: AC
  Administered 2015-03-31: 1000 mL via INTRAVENOUS

## 2015-03-31 NOTE — ED Notes (Signed)
Pt type 1 diabetic, takes pill and insulin

## 2015-03-31 NOTE — ED Notes (Signed)
Blood sugar over 600, CBG taken tonight. Pt seen in ER last week for similar; told to followup with specialist. Pt alert and oriented X4, active, cooperative, pt in NAD. RR even and unlabored, color WNL.

## 2015-03-31 NOTE — ED Provider Notes (Signed)
[facility] Emergency Department Provider Note  ____________________________________________  Time seen: 11:00 PM  I have reviewed the triage vital signs and the nursing notes.   HISTORY  Chief Complaint Hyperglycemia and Shortness of Breath      HPI Spencer [name] is a 54 y.o. male presents with hyperglycemic reading greater than 600 on the glucometer. Of note patient was seen in the emergency department last week for same. Patient admits to being compliant with medications including insulin and Lantus.     Past Medical History  Diagnosis Date  . Diabetes mellitus without complication   . Hypertension   . Brain aneurysm     There are no active problems to display for this patient.   Past Surgical History  Procedure Laterality Date  . Neck surgery    . Hernia repair      Current Outpatient Rx  Name  Route  Sig  Dispense  Refill  . hydrochlorothiazide (MICROZIDE) 12.5 MG capsule   Oral   Take 1 capsule by mouth daily.         Marland Kitchen ibuprofen (ADVIL,MOTRIN) 600 MG tablet   Oral   Take 1 tablet (600 mg total) by mouth every 8 (eight) hours as needed.   20 tablet   0   . insulin aspart (NOVOLOG) 100 UNIT/ML FlexPen   Subcutaneous   Inject into the skin.         Marland Kitchen insulin glargine (LANTUS) 100 UNIT/ML injection   Subcutaneous   Inject 52 Units into the skin 2 (two) times daily. 50 units subcutaneously twice a day         . lisinopril (PRINIVIL,ZESTRIL) 20 MG tablet   Oral   Take 1 tablet by mouth daily.         . sitaGLIPtin (JANUVIA) 100 MG tablet   Oral   Take 1 tablet (100 mg total) by mouth daily.   30 tablet   0   . traMADol (ULTRAM) 50 MG tablet      Take 1 or 2 tabs by mouth q6 hrs prn           Allergies Review of patient's allergies indicates no known allergies.  No family history on file.  Social History History  Substance Use Topics  . Smoking status: Current Every Day Smoker -- 0.50 packs/day   Types: Cigarettes  . Smokeless tobacco: Not on file  . Alcohol Use: Yes    Review of Systems  Constitutional: Negative for fever. Eyes: Negative for visual changes. ENT: Negative for sore throat. Cardiovascular: Negative for chest pain. Respiratory: Negative for shortness of breath. Gastrointestinal: Negative for abdominal pain, vomiting and diarrhea. Genitourinary: Negative for dysuria. Musculoskeletal: Negative for back pain. Skin: Negative for rash. Neurological: Negative for headaches, focal weakness or numbness.   10-point ROS otherwise negative.  ____________________________________________   PHYSICAL EXAM:  VITAL SIGNS: ED Triage Vitals  Enc Vitals Group     BP 03/31/15 2219 137/95 mmHg     Pulse Rate 03/31/15 2219 87     Resp 03/31/15 2219 18     Temp 03/31/15 2219 98 F (36.7 C)     Temp Source 03/31/15 2219 Oral     SpO2 03/31/15 2219 100 %     Weight 03/31/15 2219 170 lb (77.111 kg)     Height 03/31/15 2219  (1.854 m)     Head Cir --      Peak Flow --      Pain Score 03/31/15  2219 10     Pain Loc --      Pain Edu? --      Excl. in GC? --      Constitutional: Alert and oriented. Well appearing and in no distress. Eyes: Conjunctivae are normal. PERRL. Normal extraocular movements. ENT   Head: Normocephalic and atraumatic.   Nose: No congestion/rhinnorhea.   Mouth/Throat: Mucous membranes are moist.   Neck: No stridor. Hematological/Lymphatic/Immunilogical: No cervical lymphadenopathy. Cardiovascular: Normal rate, regular rhythm. Normal and symmetric distal pulses are present in all extremities. No murmurs, rubs, or gallops. Respiratory: Normal respiratory effort without tachypnea nor retractions. Breath sounds are clear and equal bilaterally. No wheezes/rales/rhonchi. Gastrointestinal: Soft and nontender. No distention. There is no CVA tenderness. Genitourinary: deferred Musculoskeletal: Nontender with normal range of motion in all  extremities. No joint effusions.  No lower extremity tenderness nor edema. Neurologic:  Normal speech and language. No gross focal neurologic deficits are appreciated. Speech is normal.  Skin:  Skin is warm, dry and intact. No rash noted. Psychiatric: Mood and affect are normal. Speech and behavior are normal. Patient exhibits appropriate insight and judgment.  ____________________________________________    LABS (pertinent positives/negatives)  Labs Reviewed  URINALYSIS COMPLETEWITH MICROSCOPIC ([facility] ONLY) - Abnormal; Notable for the following:    Color, Urine COLORLESS (*)    APPearance CLEAR (*)    Glucose, UA >500 (*)    Ketones, ur 1+ (*)    All other components within normal limits  CBC - Abnormal; Notable for the following:    RBC 4.35 (*)    All other components within normal limits  COMPREHENSIVE METABOLIC PANEL - Abnormal; Notable for the following:    Sodium 132 (*)    Chloride 98 (*)    Glucose, Bld 694 (*)    Calcium 8.8 (*)    Total Protein 6.4 (*)    All other components within normal limits  GLUCOSE, CAPILLARY - Abnormal; Notable for the following:    Glucose-Capillary 582 (*)    All other components within normal limits  GLUCOSE, CAPILLARY - Abnormal; Notable for the following:    Glucose-Capillary >600 (*)    All other components within normal limits  GLUCOSE, CAPILLARY - Abnormal; Notable for the following:    Glucose-Capillary >600 (*)    All other components within normal limits  GLUCOSE, CAPILLARY - Abnormal; Notable for the following:    Glucose-Capillary 552 (*)    All other components within normal limits  CBG MONITORING, ED     ____________________________________________   INITIAL IMPRESSION / ASSESSMENT AND PLAN / ED COURSE  Pertinent labs & imaging results that were available during my care of the patient were reviewed by me and considered in my medical decision making (see chart for details).  Patient with hyperglycemia glucose greater  than 600 on glucometer. Patient receives 2 L normal saline IV bolus in addition to insulin 10 units IV. With improvement of glucose to 187. We'll discharge patient home with outpatient follow up with Dr. Greggory StallionGeorge  ____________________________________________   FINAL CLINICAL IMPRESSION(S) / ED DIAGNOSES  Final diagnoses:  Diabetes mellitus type II, uncontrolled      Darci Currentandolph N Brown, MD 04/01/15 0201

## 2015-04-01 LAB — GLUCOSE, CAPILLARY
Glucose-Capillary: 187 mg/dL — ABNORMAL HIGH (ref 65–99)
Glucose-Capillary: 552 mg/dL (ref 65–99)
Glucose-Capillary: 582 mg/dL (ref 65–99)
Glucose-Capillary: >600 mg/dL (ref 65–99)

## 2015-04-01 LAB — COMPREHENSIVE METABOLIC PANEL
ALT: 17 U/L (ref 17–63)
ANION GAP: 12 (ref 5–15)
AST: 20 U/L (ref 15–41)
Albumin: 4 g/dL (ref 3.5–5.0)
Alkaline Phosphatase: 86 U/L (ref 38–126)
BILIRUBIN TOTAL: 0.6 mg/dL (ref 0.3–1.2)
BUN: 14 mg/dL (ref 6–20)
CO2: 22 mmol/L (ref 22–32)
Calcium: 8.8 mg/dL — ABNORMAL LOW (ref 8.9–10.3)
Chloride: 98 mmol/L — ABNORMAL LOW (ref 101–111)
Creatinine, Ser: 1.08 mg/dL (ref 0.61–1.24)
GFR calc Af Amer: >60 mL/min (ref >60)
GLUCOSE: 694 mg/dL — AB (ref 65–99)
Potassium: 4.2 mmol/L (ref 3.5–5.1)
SODIUM: 132 mmol/L — AB (ref 135–145)
Total Protein: 6.4 g/dL — ABNORMAL LOW (ref 6.5–8.1)

## 2015-04-01 MED ORDER — INSULIN ASPART 100 UNIT/ML ~~LOC~~ SOLN
SUBCUTANEOUS | Status: AC
  Filled 2015-04-01: qty 10

## 2015-04-01 MED ORDER — INSULIN ASPART 100 UNIT/ML ~~LOC~~ SOLN
10.0000 [IU] | Freq: Once | SUBCUTANEOUS | Status: AC
Start: 2015-04-01 — End: 2015-04-01
  Administered 2015-04-01: 10 [IU] via INTRAVENOUS

## 2015-04-01 MED ORDER — CEPHALEXIN 500 MG PO CAPS: 500.0000 mg | ORAL_CAPSULE | Freq: Two times a day (BID) | ORAL | Status: DC

## 2015-04-01 NOTE — Discharge Instructions (Signed)
Hyperglycemia °Hyperglycemia occurs when the glucose (sugar) in your blood is too high. Hyperglycemia can happen for many reasons, but it most often happens to people who do not know they have diabetes or are not managing their diabetes properly.  °CAUSES  °Whether you have diabetes or not, there are other causes of hyperglycemia. Hyperglycemia can occur when you have diabetes, but it can also occur in other situations that you might not be as aware of, such as: °Diabetes °· If you have diabetes and are having problems controlling your blood glucose, hyperglycemia could occur because of some of the following reasons: °· Not following your meal plan. °· Not taking your diabetes medications or not taking it properly. °· Exercising less or doing less activity than you normally do. °· Being sick. °Pre-diabetes °· This cannot be ignored. Before people develop Type 2 diabetes, they almost always have "pre-diabetes." This is when your blood glucose levels are higher than normal, but not yet high enough to be diagnosed as diabetes. Research has shown that some long-term damage to the body, especially the heart and circulatory system, may already be occurring during pre-diabetes. If you take action to manage your blood glucose when you have pre-diabetes, you may delay or prevent Type 2 diabetes from developing. °Stress °· If you have diabetes, you may be "diet" controlled or on oral medications or insulin to control your diabetes. However, you may find that your blood glucose is higher than usual in the hospital whether you have diabetes or not. This is often referred to as "stress hyperglycemia." Stress can elevate your blood glucose. This happens because of hormones put out by the body during times of stress. If stress has been the cause of your high blood glucose, it can be followed regularly by your caregiver. That way he/she can make sure your hyperglycemia does not continue to get worse or progress to  diabetes. °Steroids °· Steroids are medications that act on the infection fighting system (immune system) to block inflammation or infection. One side effect can be a rise in blood glucose. Most people can produce enough extra insulin to allow for this rise, but for those who cannot, steroids make blood glucose levels go even higher. It is not unusual for steroid treatments to "uncover" diabetes that is developing. It is not always possible to determine if the hyperglycemia will go away after the steroids are stopped. A special blood test called an A1c is sometimes done to determine if your blood glucose was elevated before the steroids were started. °SYMPTOMS °· Thirsty. °· Frequent urination. °· Dry mouth. °· Blurred vision. °· Tired or fatigue. °· Weakness. °· Sleepy. °· Tingling in feet or leg. °DIAGNOSIS  °Diagnosis is made by monitoring blood glucose in one or all of the following ways: °· A1c test. This is a chemical found in your blood. °· Fingerstick blood glucose monitoring. °· Laboratory results. °TREATMENT  °First, knowing the cause of the hyperglycemia is important before the hyperglycemia can be treated. Treatment may include, but is not be limited to: °· Education. °· Change or adjustment in medications. °· Change or adjustment in meal plan. °· Treatment for an illness, infection, etc. °· More frequent blood glucose monitoring. °· Change in exercise plan. °· Decreasing or stopping steroids. °· Lifestyle changes. °HOME CARE INSTRUCTIONS  °· Test your blood glucose as directed. °· Exercise regularly. Your caregiver will give you instructions about exercise. Pre-diabetes or diabetes which comes on with stress is helped by exercising. °· Eat wholesome,   balanced meals. Eat often and at regular, fixed times. Your caregiver or nutritionist will give you a meal plan to guide your sugar intake. °· Being at an ideal weight is important. If needed, losing as little as 10 to 15 pounds may help improve blood  glucose levels. °SEEK MEDICAL CARE IF:  °· You have questions about medicine, activity, or diet. °· You continue to have symptoms (problems such as increased thirst, urination, or weight gain). °SEEK IMMEDIATE MEDICAL CARE IF:  °· You are vomiting or have diarrhea. °· Your breath smells fruity. °· You are breathing faster or slower. °· You are very sleepy or incoherent. °· You have numbness, tingling, or pain in your feet or hands. °· You have chest pain. °· Your symptoms get worse even though you have been following your caregiver's orders. °· If you have any other questions or concerns. °Document Released: 04/18/2001 Document Revised: 01/15/2012 Document Reviewed: 02/19/2012 °Exit[employer]® Patient Information ©2015 [employer]. This information is not intended to replace advice given to you by your health care provider. Make sure you discuss any questions you have with your health care provider. ° °Type 2 Diabetes Mellitus °Type 2 diabetes mellitus, often simply referred to as type 2 diabetes, is a long-lasting (chronic) disease. In type 2 diabetes, the pancreas does not make enough insulin (a hormone), the cells are less responsive to the insulin that is made (insulin resistance), or both. Normally, insulin moves sugars from food into the tissue cells. The tissue cells use the sugars for energy. The lack of insulin or the lack of normal response to insulin causes excess sugars to build up in the blood instead of going into the tissue cells. As a result, high blood sugar (hyperglycemia) develops. The effect of high sugar (glucose) levels can cause many complications.  °Type 2 diabetes was also previously called adult-onset diabetes, but it can occur at any age.    °RISK FACTORS  °A person is predisposed to developing type 2 diabetes if someone in the family has the disease and also has one or more of the following primary risk factors: °· Overweight. °· An inactive lifestyle. °· A history of consistently eating  high-calorie foods. °Maintaining a normal weight and regular physical activity can reduce the chance of developing type 2 diabetes. °SYMPTOMS  °A person with type 2 diabetes may not show symptoms initially. The symptoms of type 2 diabetes appear slowly. The symptoms include: °· Increased thirst (polydipsia). °· Increased urination (polyuria). °· Increased urination during the night (nocturia). °· Weight loss. This weight loss may be rapid. °· Frequent, recurring infections. °· Tiredness (fatigue). °· Weakness. °· Vision changes, such as blurred vision. °· Fruity smell to your breath. °· Abdominal pain. °· Nausea or vomiting. °· Cuts or bruises which are slow to heal. °· Tingling or numbness in the hands or feet. °DIAGNOSIS °Type 2 diabetes is frequently not diagnosed until complications of diabetes are present. Type 2 diabetes is diagnosed when symptoms or complications are present and when blood glucose levels are increased. Your blood glucose level may be checked by one or more of the following blood tests: °· A fasting blood glucose test. You will not be allowed to eat for at least 8 hours before a blood sample is taken. °· A random blood glucose test. Your blood glucose is checked at any time of the day regardless of when you ate. °· A hemoglobin A1c blood glucose test. A hemoglobin A1c test provides information about blood glucose control over the previous 3   months. °· An oral glucose tolerance test (OGTT). Your blood glucose is measured after you have not eaten (fasted) for 2 hours and then after you drink a glucose-containing beverage. °TREATMENT  °· You may need to take insulin or diabetes medicine daily to keep blood glucose levels in the desired range. °· If you use insulin, you may need to adjust the dosage depending on the carbohydrates that you eat with each meal or snack. °The treatment goal is to maintain the before meal blood sugar (preprandial glucose) level at 70-130 mg/dL. °HOME CARE INSTRUCTIONS   °· Have your hemoglobin A1c level checked twice a year. °· Perform daily blood glucose monitoring as directed by your health care provider. °· Monitor urine ketones when you are ill and as directed by your health care provider. °· Take your diabetes medicine or insulin as directed by your health care provider to maintain your blood glucose levels in the desired range. °¨ Never run out of diabetes medicine or insulin. It is needed every day. °¨ If you are using insulin, you may need to adjust the amount of insulin given based on your intake of carbohydrates. Carbohydrates can raise blood glucose levels but need to be included in your diet. Carbohydrates provide vitamins, minerals, and fiber which are an essential part of a healthy diet. Carbohydrates are found in fruits, vegetables, whole grains, dairy products, legumes, and foods containing added sugars. °· Eat healthy foods. You should make an appointment to see a registered dietitian to help you create an eating plan that is right for you. °· Lose weight if you are overweight. °· Carry a medical alert card or wear your medical alert jewelry. °· Carry a 15-gram carbohydrate snack with you at all times to treat low blood glucose (hypoglycemia). Some examples of 15-gram carbohydrate snacks include: °¨ Glucose tablets, 3 or 4. °¨ Glucose gel, 15-gram tube. °¨ Raisins, 2 tablespoons (24 grams). °¨ Jelly beans, 6. °¨ Animal crackers, 8. °¨ Regular pop, 4 ounces (120 mL). °¨ Gummy treats, 9. °· Recognize hypoglycemia. Hypoglycemia occurs with blood glucose levels of 70 mg/dL and below. The risk for hypoglycemia increases when fasting or skipping meals, during or after intense exercise, and during sleep. Hypoglycemia symptoms can include: °¨ Tremors or shakes. °¨ Decreased ability to concentrate. °¨ Sweating. °¨ Increased heart rate. °¨ Headache. °¨ Dry mouth. °¨ Hunger. °¨ Irritability. °¨ Anxiety. °¨ Restless sleep. °¨ Altered speech or  coordination. °¨ Confusion. °· Treat hypoglycemia promptly. If you are alert and able to safely swallow, follow the 15:15 rule:  °¨ Take 15-20 grams of rapid-acting glucose or carbohydrate. Rapid-acting options include glucose gel, glucose tablets, or 4 ounces (120 mL) of fruit juice, regular soda, or low-fat milk. °¨ Check your blood glucose level 15 minutes after taking the glucose. °¨ Take 15-20 grams more of glucose if the repeat blood glucose level is still 70 mg/dL or below. °¨ Eat a meal or snack within 1 hour once blood glucose levels return to normal. °· Be alert to feeling very thirsty and urinating more frequently than usual, which are early signs of hyperglycemia. An early awareness of hyperglycemia allows for prompt treatment. Treat hyperglycemia as directed by your health care provider. °· Engage in at least 150 minutes of moderate-intensity physical activity a week, spread over at least 3 days of the week or as directed by your health care provider.  In addition, you should engage in resistance exercise at least 2 times a week or as directed by your health   care provider. Try to spend no more than 90 minutes at one time inactive. °· Adjust your medicine and food intake as needed if you start a new exercise or sport. °· Follow your sick-day plan anytime you are unable to eat or drink as usual. °· Do not use any tobacco products including cigarettes, chewing tobacco, or electronic cigarettes. If you need help quitting, ask your health care provider. °· Limit alcohol intake to no more than 1 drink per day for nonpregnant women and 2 drinks per day for men. You should drink alcohol only when you are also eating food. Talk with your health care provider whether alcohol is safe for you. Tell your health care provider if you drink alcohol several times a week. °· Keep all follow-up visits as directed by your health care provider. This is important. °· Schedule an eye exam soon after the diagnosis of type 2  diabetes and then annually. °· Perform daily skin and foot care. Examine your skin and feet daily for cuts, bruises, redness, nail problems, bleeding, blisters, or sores. A foot exam by a health care provider should be done annually. °· Brush your teeth and gums at least twice a day and floss at least once a day. Follow up with your dentist regularly. °· Share your diabetes management plan with your workplace or school. °· Stay up-to-date with immunizations. It is recommended that people with diabetes who are over 65 years old get the pneumonia vaccine. In some cases, two separate shots may be given. Ask your health care provider if your pneumonia vaccination is up-to-date. °· Learn to manage stress. °· Obtain ongoing diabetes education and support as needed. °· Participate in or seek rehabilitation as needed to maintain or improve independence and quality of life. Request a physical or occupational therapy referral if you are having foot or hand numbness, or difficulties with grooming, dressing, eating, or physical activity. °SEEK MEDICAL CARE IF:  °· You are unable to eat food or drink fluids for more than 6 hours. °· You have nausea and vomiting for more than 6 hours. °· Your blood glucose level is over 240 mg/dL. °· There is a change in mental status. °· You develop an additional serious illness. °· You have diarrhea for more than 6 hours. °· You have been sick or have had a fever for a couple of days and are not getting better. °· You have pain during any physical activity.   °SEEK IMMEDIATE MEDICAL CARE IF: °· You have difficulty breathing. °· You have moderate to large ketone levels. °MAKE SURE YOU: °· Understand these instructions. °· Will watch your condition. °· Will get help right away if you are not doing well or get worse. °Document Released: 10/23/2005 Document Revised: 03/09/2014 Document Reviewed: 05/21/2012 °[employer]® Patient Information ©2015 [employer]. This information is not intended to  replace advice given to you by your health care provider. Make sure you discuss any questions you have with your health care provider. ° °

## 2015-04-03 ENCOUNTER — Encounter: Payer: Self-pay | Admitting: Emergency Medicine

## 2015-04-03 ENCOUNTER — Inpatient Hospital Stay (HOSPITAL_COMMUNITY)
Admission: AD | Admit: 2015-04-03 | Discharge: 2015-04-06 | DRG: 638 | Disposition: A | Discharge status: Home / Self Care | Payer: Medicaid Other | Source: Other Acute Inpatient Hospital | Attending: Internal Medicine | Admitting: Internal Medicine

## 2015-04-03 ENCOUNTER — Other Ambulatory Visit: Payer: Self-pay

## 2015-04-03 ENCOUNTER — Inpatient Hospital Stay
Admission: EM | Admit: 2015-04-03 | Discharge: 2015-04-03 | DRG: 639 | Disposition: A | Discharge status: Other Acute Inpatient Hospital | Payer: Medicaid Other | Attending: Internal Medicine | Admitting: Internal Medicine

## 2015-04-03 ENCOUNTER — Inpatient Hospital Stay: Payer: Medicaid Other

## 2015-04-03 DIAGNOSIS — E1310 Other specified diabetes mellitus with ketoacidosis without coma: Principal | ICD-10-CM | POA: Diagnosis present

## 2015-04-03 DIAGNOSIS — T383X6A Underdosing of insulin and oral hypoglycemic [antidiabetic] drugs, initial encounter: Secondary | ICD-10-CM | POA: Diagnosis present

## 2015-04-03 DIAGNOSIS — F1120 Opioid dependence, uncomplicated: Secondary | ICD-10-CM | POA: Diagnosis present

## 2015-04-03 DIAGNOSIS — F32A Depression, unspecified: Secondary | ICD-10-CM

## 2015-04-03 DIAGNOSIS — F17210 Nicotine dependence, cigarettes, uncomplicated: Secondary | ICD-10-CM | POA: Diagnosis present

## 2015-04-03 DIAGNOSIS — F329 Major depressive disorder, single episode, unspecified: Secondary | ICD-10-CM

## 2015-04-03 DIAGNOSIS — I10 Essential (primary) hypertension: Secondary | ICD-10-CM | POA: Diagnosis present

## 2015-04-03 DIAGNOSIS — E876 Hypokalemia: Secondary | ICD-10-CM | POA: Diagnosis not present

## 2015-04-03 DIAGNOSIS — E0810 Diabetes mellitus due to underlying condition with ketoacidosis without coma: Secondary | ICD-10-CM | POA: Diagnosis not present

## 2015-04-03 DIAGNOSIS — Z794 Long term (current) use of insulin: Secondary | ICD-10-CM

## 2015-04-03 DIAGNOSIS — Z91128 Patient's intentional underdosing of medication regimen for other reason: Secondary | ICD-10-CM | POA: Diagnosis present

## 2015-04-03 DIAGNOSIS — Z8249 Family history of ischemic heart disease and other diseases of the circulatory system: Secondary | ICD-10-CM

## 2015-04-03 DIAGNOSIS — Y929 Unspecified place or not applicable: Secondary | ICD-10-CM

## 2015-04-03 DIAGNOSIS — E1110 Type 2 diabetes mellitus with ketoacidosis without coma: Secondary | ICD-10-CM | POA: Diagnosis present

## 2015-04-03 DIAGNOSIS — E1165 Type 2 diabetes mellitus with hyperglycemia: Secondary | ICD-10-CM | POA: Diagnosis not present

## 2015-04-03 DIAGNOSIS — Z9119 Patient's noncompliance with other medical treatment and regimen: Secondary | ICD-10-CM | POA: Diagnosis present

## 2015-04-03 DIAGNOSIS — Z720 Tobacco use: Secondary | ICD-10-CM | POA: Diagnosis present

## 2015-04-03 LAB — COMPREHENSIVE METABOLIC PANEL
ALT: 17 U/L (ref 17–63)
AST: 19 U/L (ref 15–41)
Albumin: 3.9 g/dL (ref 3.5–5.0)
Alkaline Phosphatase: 94 U/L (ref 38–126)
Anion gap: UNDETERMINED (ref 5–15)
BUN: 20 mg/dL (ref 6–20)
CALCIUM: 8.8 mg/dL — AB (ref 8.9–10.3)
CHLORIDE: 94 mmol/L — AB (ref 101–111)
CO2: 19 mmol/L — ABNORMAL LOW (ref 22–32)
CREATININE: 1.49 mg/dL — AB (ref 0.61–1.24)
GFR, EST AFRICAN AMERICAN: 59 mL/min — AB (ref >60)
GFR, EST NON AFRICAN AMERICAN: 51 mL/min — AB (ref >60)
GLUCOSE: 891 mg/dL — AB (ref 65–99)
Potassium: 5.1 mmol/L (ref 3.5–5.1)
SODIUM: UNDETERMINED mmol/L (ref 135–145)
TOTAL PROTEIN: UNDETERMINED g/dL (ref 6.5–8.1)
Total Bilirubin: 0.5 mg/dL (ref 0.3–1.2)

## 2015-04-03 LAB — GLUCOSE, CAPILLARY
GLUCOSE-CAPILLARY: 353 mg/dL — AB (ref 65–99)
Glucose-Capillary: >600 mg/dL (ref 65–99)
Glucose-Capillary: 465 mg/dL — ABNORMAL HIGH (ref 65–99)

## 2015-04-03 LAB — URINALYSIS COMPLETE WITH MICROSCOPIC (ARMC ONLY)
Bacteria, UA: NONE SEEN
Bilirubin Urine: NEGATIVE
Glucose, UA: >500 mg/dL — AB
HGB URINE DIPSTICK: NEGATIVE
Leukocytes, UA: NEGATIVE
Nitrite: NEGATIVE
PH: 5 (ref 5.0–8.0)
Protein, ur: NEGATIVE mg/dL
Specific Gravity, Urine: 1.024 (ref 1.005–1.030)
Squamous Epithelial / LPF: NONE SEEN

## 2015-04-03 LAB — CBC
HCT: 43.5 % (ref 40.0–52.0)
Hemoglobin: 14.6 g/dL (ref 13.0–18.0)
MCH: 32.5 pg (ref 26.0–34.0)
MCHC: 33.5 g/dL (ref 32.0–36.0)
MCV: 97 fL (ref 80.0–100.0)
PLATELETS: 195 10*3/uL (ref 150–440)
RBC: 4.49 MIL/uL (ref 4.40–5.90)
RDW: 13.3 % (ref 11.5–14.5)
WBC: 8 10*3/uL (ref 3.8–10.6)

## 2015-04-03 LAB — TROPONIN I: Troponin I: <0.03 ng/mL (ref <0.031)

## 2015-04-03 LAB — LIPASE, BLOOD: Lipase: 40 U/L (ref 22–51)

## 2015-04-03 MED ORDER — SODIUM CHLORIDE 0.9 % IV BOLUS (SEPSIS)
2000.0000 mL | Freq: Once | INTRAVENOUS | Status: AC
  Administered 2015-04-03: 2000 mL via INTRAVENOUS

## 2015-04-03 MED ORDER — SODIUM CHLORIDE 0.9 % IV BOLUS (SEPSIS)
1000.0000 mL | Freq: Once | INTRAVENOUS | Status: AC
  Administered 2015-04-03: 1000 mL via INTRAVENOUS

## 2015-04-03 MED ORDER — SODIUM CHLORIDE 0.9 % IV SOLN
INTRAVENOUS | Status: DC
  Administered 2015-04-03: 4.1 [IU]/h via INTRAVENOUS
  Filled 2015-04-03: qty 2.5

## 2015-04-03 NOTE — ED Notes (Signed)
Spoke with Marchelle FolksAmanda, RN [facility]([place]) for hand-off report.

## 2015-04-03 NOTE — ED Provider Notes (Addendum)
[facility] Emergency Department Provider Note  ____________________________________________  Time seen: 5:30 PM  I have reviewed the triage vital signs and the nursing notes.   HISTORY  Chief Complaint Hyperglycemia    HPI Spencer [name] is a 54 y.o. male who complains of very high blood sugar level. He was called by his primary care doctor's office to be told him that his sugar level was over 800 admitting the ER. He complains apply to see and polyuria. He has had multiple visits to the ED as well as his primary care doctor over the last few weeks for his uncontrolled hyperglycemia with diabetes. He does admit to some noncompliance with his diabetes therapy as he discontinued metformin due to it causing erectile dysfunction. As of breath but notes that he feels profoundly fatigued and has blurry vision and sometimes feels off balance.  No numbness tingling or weakness now. No falls or head injury.   Past Medical History  Diagnosis Date  . Diabetes mellitus without complication   . Hypertension   . Brain aneurysm     There are no active problems to display for this patient.   Past Surgical History  Procedure Laterality Date  . Neck surgery    . Hernia repair      Current Outpatient Rx  Name  Route  Sig  Dispense  Refill  . cephALEXin (KEFLEX) 500 MG capsule   Oral   Take 1 capsule (500 mg total) by mouth 2 (two) times daily.   20 capsule   0   . hydrochlorothiazide (MICROZIDE) 12.5 MG capsule   Oral   Take 1 capsule by mouth daily.         Marland Kitchen ibuprofen (ADVIL,MOTRIN) 600 MG tablet   Oral   Take 1 tablet (600 mg total) by mouth every 8 (eight) hours as needed. Patient not taking: Reported on 04/01/2015   20 tablet   0   . insulin aspart (NOVOLOG) 100 UNIT/ML FlexPen   Subcutaneous   Inject 10 Units into the skin 3 (three) times daily with meals.          . insulin glargine (LANTUS) 100 UNIT/ML injection   Subcutaneous   Inject 50  Units into the skin 2 (two) times daily.          Marland Kitchen lisinopril (PRINIVIL,ZESTRIL) 20 MG tablet   Oral   Take 1 tablet by mouth daily.         . sitaGLIPtin (JANUVIA) 100 MG tablet   Oral   Take 1 tablet (100 mg total) by mouth daily.   30 tablet   0     Allergies Review of patient's allergies indicates no known allergies.  History reviewed. No pertinent family history.  Social History History  Substance Use Topics  . Smoking status: Current Every Day Smoker -- 0.50 packs/day    Types: Cigarettes  . Smokeless tobacco: Not on file  . Alcohol Use: Yes    Review of Systems  Constitutional: No fever or chills. Rapid weight loss over the last few months. Eyes: Positive blurry vision.  ENT: No sore throat. Cardiovascular: No chest pain. Respiratory: No dyspnea or cough. Gastrointestinal: Negative for abdominal pain, vomiting and diarrhea.  No BRBPR or melena. Genitourinary: Polyuria Musculoskeletal: Negative for back pain. No joint swelling or pain. Skin: Negative for rash. Neurological: Negative for headaches, focal weakness or numbness. Psychiatric:No anxiety or depression.   Endocrine:Poor sleep. Polydipsia.Marland Kitchen  10-point ROS otherwise negative.  ____________________________________________   PHYSICAL  EXAM:  VITAL SIGNS: ED Triage Vitals  Enc Vitals Group     BP 04/03/15 1655 166/87 mmHg     Pulse Rate 04/03/15 1655 75     Resp 04/03/15 1655 20     Temp 04/03/15 1655 97.5 F (36.4 C)     Temp Source 04/03/15 1655 Oral     SpO2 04/03/15 1655 97 %     Weight 04/03/15 1655 170 lb (77.111 kg)     Height 04/03/15 1655  (1.854 m)     Head Cir --      Peak Flow --      Pain Score 04/03/15 1656 8     Pain Loc --      Pain Edu? --      Excl. in GC? --      Constitutional: Alert and oriented. Well appearing and in no distress. Eyes: No scleral icterus. No conjunctival pallor. PERRL. EOMI ENT   Head: Normocephalic and atraumatic.   Nose: No  congestion/rhinnorhea. No septal hematoma   Mouth/Throat: Dry mucous membranes, no pharyngeal erythema. No peritonsillar mass. No uvula shift.   Neck: No stridor. No SubQ emphysema. No meningismus. Hematological/Lymphatic/Immunilogical: No cervical lymphadenopathy. Cardiovascular: RRR. Normal and symmetric distal pulses are present in all extremities. No murmurs, rubs, or gallops. Respiratory: Normal respiratory effort without tachypnea nor retractions. Breath sounds are clear and equal bilaterally. No wheezes/rales/rhonchi. Gastrointestinal: Soft and nontender. No distention. There is no CVA tenderness.  No rebound, rigidity, or guarding. Genitourinary: deferred Musculoskeletal: Nontender with normal range of motion in all extremities. No joint effusions.  No lower extremity tenderness.  No edema. Neurologic:   Normal speech and language.  CN 2-10 normal. Motor grossly intact. No pronator drift.  Normal gait. No gross focal neurologic deficits are appreciated.  Skin:  Skin is warm, dry and intact. No rash noted.  No petechiae, purpura, or bullae. Psychiatric: Mood and affect are normal. Speech and behavior are normal. Patient exhibits appropriate insight and judgment.  ____________________________________________    LABS (pertinent positives/negatives) (all labs ordered are listed, but only abnormal results are displayed) Labs Reviewed  GLUCOSE, CAPILLARY - Abnormal; Notable for the following:    Glucose-Capillary >600 (*)    All other components within normal limits  COMPREHENSIVE METABOLIC PANEL - Abnormal; Notable for the following:    Chloride 94 (*)    CO2 19 (*)    Glucose, Bld 891 (*)    Creatinine, Ser 1.49 (*)    Calcium 8.8 (*)    GFR calc non Af Amer 51 (*)    GFR calc Af Amer 59 (*)    All other components within normal limits  URINALYSIS COMPLETEWITH MICROSCOPIC ([facility] ONLY) - Abnormal; Notable for the following:    Color, Urine COLORLESS (*)    APPearance  CLEAR (*)    Glucose, UA >500 (*)    Ketones, ur 1+ (*)    All other components within normal limits  GLUCOSE, CAPILLARY - Abnormal; Notable for the following:    Glucose-Capillary >600 (*)    All other components within normal limits  CBC  LIPASE, BLOOD  CBG MONITORING, ED   ____________________________________________   EKG  Interpreted by me  Date: 04/03/2015  Rate: 60  Rhythm: normal sinus rhythm  QRS Axis: normal  Intervals: normal  ST/T Wave abnormalities: normal  Conduction Disutrbances: none  Narrative Interpretation: unremarkable      ____________________________________________    RADIOLOGY  Chest x-ray unremarkable  ____________________________________________   PROCEDURES CRITICAL  CARE Performed by: Sharman CheekSTAFFORD, Styles Fambro   Total critical care time: 35 minutes  Critical care time was exclusive of separately billable procedures and treating other patients.  Critical care was necessary to treat or prevent imminent or life-threatening deterioration.  Critical care was time spent personally by me on the following activities: development of treatment plan with patient and/or surrogate as well as nursing, discussions with consultants, evaluation of patient's response to treatment, examination of patient, obtaining history from patient or surrogate, ordering and performing treatments and interventions, ordering and review of laboratory studies, ordering and review of radiographic studies, pulse oximetry and re-evaluation of patient's condition.  ____________________________________________   INITIAL IMPRESSION / ASSESSMENT AND PLAN / ED COURSE  Pertinent labs & imaging results that were available during my care of the patient were reviewed by me and considered in my medical decision making (see chart for details).  Patient presents with continued hyperglycemia due to combination of likely medication noncompliance and very difficult to control diabetes. He  notes that he has been told that he should see an endocrinologist but has not yet followed up with one. We'll check his labs and urine and give him IV fluids. Due to his severe hyperglycemia, we'll need to make sure he doesn't have DKA.  ----------------------------------------- 7:09 PM on 04/03/2015 -----------------------------------------  Patient has remained hemodynamic stable. However, his lab results reveal urinary ketones and acidosis on the blood work. This is consistent with diagnosis of diabetic ketoacidosis. Especially with his markedly elevated blood sugar of almost 900, and admitted the hospital on insulin drip with continued IV fluids.  ____________________________________________   FINAL CLINICAL IMPRESSION(S) / ED DIAGNOSES  Final diagnoses:  Diabetic ketoacidosis without coma associated with type 2 diabetes mellitus      Sharman CheekPhillip Suzanne Kho, MD 04/03/15 1909  ----------------------------------------- 8:08 PM on 04/03/2015 -----------------------------------------  Lear[facility] from staff [facility] hospital [facility]. We'll pursue transfer for further management. I did discuss the case with Dr. [facility]ale[facility]av[facility] of [facility], who requested troponin chest x-ray and EKG for further risk stratification and characterization of the patient's current pathology. We'll check his test and follow-up to proceed with transfer.  ----------------------------------------- 9:14 PM on 04/03/2015 -----------------------------------------  Troponin chest x-ray EKG all unremarkable. I followed up with Dr. Onalee [facility]avid at [facility] who [facility]. [facility] to coordinate transport after bed assignment.  Sharman CheekPhillip Faryal Marxen, MD 04/03/15 2114

## 2015-04-03 NOTE — ED Notes (Signed)
Pt presents with high sugar>600 MD called him Thursday night told him to come get seen pt didn't slept all day Fri. Came in now with SOB, Shaking, Fatigue, blurred vision ears ringing.

## 2015-04-03 NOTE — ED Notes (Signed)
Spoke with EMS with hand-off report.

## 2015-04-04 ENCOUNTER — Encounter (HOSPITAL_COMMUNITY): Payer: Self-pay | Admitting: *Deleted

## 2015-04-04 LAB — BASIC METABOLIC PANEL
ANION GAP: 7 (ref 5–15)
ANION GAP: 9 (ref 5–15)
Anion gap: 9 (ref 5–15)
BUN: 13 mg/dL (ref 6–20)
BUN: 11 mg/dL (ref 6–20)
BUN: 11 mg/dL (ref 6–20)
CALCIUM: 8.4 mg/dL — AB (ref 8.9–10.3)
CHLORIDE: 105 mmol/L (ref 101–111)
CHLORIDE: 108 mmol/L (ref 101–111)
CO2: 22 mmol/L (ref 22–32)
CO2: 23 mmol/L (ref 22–32)
CO2: 24 mmol/L (ref 22–32)
Calcium: 8.3 mg/dL — ABNORMAL LOW (ref 8.9–10.3)
Calcium: 8.2 mg/dL — ABNORMAL LOW (ref 8.9–10.3)
Chloride: 106 mmol/L (ref 101–111)
Creatinine, Ser: 1 mg/dL (ref 0.61–1.24)
Creatinine, Ser: 0.81 mg/dL (ref 0.61–1.24)
Creatinine, Ser: 0.75 mg/dL (ref 0.61–1.24)
GFR calc Af Amer: >60 mL/min (ref >60)
GFR calc non Af Amer: >60 mL/min (ref >60)
Glucose, Bld: 229 mg/dL — ABNORMAL HIGH (ref 65–99)
Glucose, Bld: 98 mg/dL (ref 65–99)
Glucose, Bld: 102 mg/dL — ABNORMAL HIGH (ref 65–99)
POTASSIUM: 3 mmol/L — AB (ref 3.5–5.1)
POTASSIUM: 3.5 mmol/L (ref 3.5–5.1)
Potassium: 3.1 mmol/L — ABNORMAL LOW (ref 3.5–5.1)
SODIUM: 139 mmol/L (ref 135–145)
Sodium: 136 mmol/L (ref 135–145)
Sodium: 138 mmol/L (ref 135–145)

## 2015-04-04 LAB — MAGNESIUM: Magnesium: 2 mg/dL (ref 1.7–2.4)

## 2015-04-04 LAB — GLUCOSE, CAPILLARY
GLUCOSE-CAPILLARY: 56 mg/dL — AB (ref 65–99)
GLUCOSE-CAPILLARY: 73 mg/dL (ref 65–99)
GLUCOSE-CAPILLARY: 121 mg/dL — AB (ref 65–99)
Glucose-Capillary: 219 mg/dL — ABNORMAL HIGH (ref 65–99)
Glucose-Capillary: 384 mg/dL — ABNORMAL HIGH (ref 65–99)
Glucose-Capillary: 106 mg/dL — ABNORMAL HIGH (ref 65–99)
Glucose-Capillary: 69 mg/dL (ref 65–99)
Glucose-Capillary: 101 mg/dL — ABNORMAL HIGH (ref 65–99)
Glucose-Capillary: 102 mg/dL — ABNORMAL HIGH (ref 65–99)
Glucose-Capillary: 258 mg/dL — ABNORMAL HIGH (ref 65–99)
Glucose-Capillary: 158 mg/dL — ABNORMAL HIGH (ref 65–99)

## 2015-04-04 LAB — CBC
HEMATOCRIT: 39.3 % (ref 39.0–52.0)
HEMOGLOBIN: 13.8 g/dL (ref 13.0–17.0)
MCH: 31.9 pg (ref 26.0–34.0)
MCHC: 35.1 g/dL (ref 30.0–36.0)
MCV: 91 fL (ref 78.0–100.0)
Platelets: 188 10*3/uL (ref 150–400)
RBC: 4.32 MIL/uL (ref 4.22–5.81)
RDW: 12.9 % (ref 11.5–15.5)
WBC: 6.4 10*3/uL (ref 4.0–10.5)

## 2015-04-04 LAB — MRSA PCR SCREENING: MRSA BY PCR: NEGATIVE

## 2015-04-04 LAB — RAPID URINE DRUG SCREEN, HOSP PERFORMED
AMPHETAMINES: NOT DETECTED
Barbiturates: NOT DETECTED
Benzodiazepines: NOT DETECTED
Cocaine: POSITIVE — AB
OPIATES: NOT DETECTED
Tetrahydrocannabinol: NOT DETECTED

## 2015-04-04 MED ORDER — POTASSIUM CHLORIDE 10 MEQ/100ML IV SOLN
10.0000 meq | INTRAVENOUS | Status: AC
  Administered 2015-04-04 (×2): 10 meq via INTRAVENOUS
  Filled 2015-04-04 (×2): qty 100

## 2015-04-04 MED ORDER — SODIUM CHLORIDE 0.9 % IV SOLN
INTRAVENOUS | Status: DC
  Administered 2015-04-04 – 2015-04-05 (×2): via INTRAVENOUS

## 2015-04-04 MED ORDER — INSULIN GLARGINE 100 UNIT/ML ~~LOC~~ SOLN
50.0000 [IU] | Freq: Two times a day (BID) | SUBCUTANEOUS | Status: DC
  Administered 2015-04-04 – 2015-04-05 (×3): 50 [IU] via SUBCUTANEOUS
  Filled 2015-04-04 (×5): qty 0.5

## 2015-04-04 MED ORDER — ENOXAPARIN SODIUM 40 MG/0.4ML ~~LOC~~ SOLN
40.0000 mg | SUBCUTANEOUS | Status: DC
  Administered 2015-04-04 – 2015-04-05 (×2): 40 mg via SUBCUTANEOUS
  Filled 2015-04-04 (×3): qty 0.4

## 2015-04-04 MED ORDER — INSULIN REGULAR HUMAN 100 UNIT/ML IJ SOLN
INTRAMUSCULAR | Status: DC
  Filled 2015-04-04: qty 2.5

## 2015-04-04 MED ORDER — ASPIRIN EC 81 MG PO TBEC
81.0000 mg | DELAYED_RELEASE_TABLET | Freq: Every day | ORAL | Status: DC
  Administered 2015-04-04 – 2015-04-06 (×3): 81 mg via ORAL
  Filled 2015-04-04 (×3): qty 1

## 2015-04-04 MED ORDER — INSULIN ASPART 100 UNIT/ML ~~LOC~~ SOLN: 0.0000 [IU] | Freq: Every day | SUBCUTANEOUS | Status: DC

## 2015-04-04 MED ORDER — SODIUM CHLORIDE 0.9 % IV SOLN: INTRAVENOUS | Status: DC

## 2015-04-04 MED ORDER — POTASSIUM CHLORIDE CRYS ER 20 MEQ PO TBCR
40.0000 meq | EXTENDED_RELEASE_TABLET | Freq: Once | ORAL | Status: AC
  Administered 2015-04-04: 40 meq via ORAL
  Filled 2015-04-04: qty 2

## 2015-04-04 MED ORDER — INSULIN ASPART 100 UNIT/ML FLEXPEN: 20.0000 [IU] | PEN_INJECTOR | Freq: Three times a day (TID) | SUBCUTANEOUS | Status: DC

## 2015-04-04 MED ORDER — INSULIN ASPART 100 UNIT/ML ~~LOC~~ SOLN
20.0000 [IU] | Freq: Three times a day (TID) | SUBCUTANEOUS | Status: DC
  Administered 2015-04-04 – 2015-04-05 (×3): 20 [IU] via SUBCUTANEOUS

## 2015-04-04 MED ORDER — DEXTROSE-NACL 5-0.45 % IV SOLN
INTRAVENOUS | Status: DC
  Administered 2015-04-04: 02:00:00 via INTRAVENOUS

## 2015-04-04 MED ORDER — DEXTROSE 50 % IV SOLN
INTRAVENOUS | Status: AC
  Administered 2015-04-04: 30 mL
  Filled 2015-04-04: qty 50

## 2015-04-04 MED ORDER — INSULIN ASPART 100 UNIT/ML ~~LOC~~ SOLN
0.0000 [IU] | Freq: Three times a day (TID) | SUBCUTANEOUS | Status: DC
  Administered 2015-04-04: 4 [IU] via SUBCUTANEOUS
  Administered 2015-04-05: 15 [IU] via SUBCUTANEOUS
  Administered 2015-04-05: 3 [IU] via SUBCUTANEOUS

## 2015-04-04 MED ORDER — INSULIN ASPART 100 UNIT/ML ~~LOC~~ SOLN
0.0000 [IU] | Freq: Three times a day (TID) | SUBCUTANEOUS | Status: DC
  Administered 2015-04-04: 8 [IU] via SUBCUTANEOUS

## 2015-04-04 MED ORDER — LISINOPRIL 20 MG PO TABS
20.0000 mg | ORAL_TABLET | Freq: Every day | ORAL | Status: DC
  Administered 2015-04-04 – 2015-04-06 (×3): 20 mg via ORAL
  Filled 2015-04-04 (×3): qty 1

## 2015-04-04 MED ORDER — POTASSIUM CHLORIDE CRYS ER 20 MEQ PO TBCR: 40.0000 meq | EXTENDED_RELEASE_TABLET | Freq: Two times a day (BID) | ORAL | Status: DC

## 2015-04-04 NOTE — Progress Notes (Signed)
[place] TEAM 1 - Stepdown/ICU TEAM Progress Note  Spencer HakimRoger A [name] ZOX:[id number] DOB: 07/13/1961 DOA: 04/03/2015 PCP: No PCP Per Patient  Admit HPI / Brief Narrative: 54 yo male h/o DM and HTN tansferred from [place] due to lack of beds there for DKA. ED there reported pt has compliance issues, patient denies this and says that his sugar is very labile for several months now. Pt initial gluc was over 800 now below 400 on insulin gtt. Pt aggravated because he has been having this problem for 6 years.  HPI/Subjective: Pt seen for f/u visit.  Assessment/Plan:  DKA - uncontrolled DM2 Now off insulin gtt but CBG remains quite labile - adjust insulin tx and follow  HTN  Hypokalemia Due to DKA - replace and follow   Cocaine abuse  Code Status: FULL Family Communication: no family present at time of exam Disposition Plan: transfer to med bed - d/c home when CBG more stable   Consultants: none  Procedures: none  Antibiotics: none  DVT prophylaxis: lovenox  Objective: Blood pressure 131/86, pulse 53, temperature 97.9 F (36.6 C), temperature source Oral, resp. rate 19, height 6' (1.829 m), weight 77.1 kg (169 lb 15.6 oz), SpO2 99 %.  Intake/Output Summary (Last 24 hours) at 04/04/15 1434 Last data filed at 04/04/15 1332  Gross per 24 hour  Intake 933.33 ml  Output   1090 ml  Net -156.67 ml   Exam: Pt seen for f/u visit  Data Reviewed: Basic Metabolic Panel:  Recent Labs Lab 03/31/15 2252 04/03/15 1720 04/04/15 0025 04/04/15 0350 04/04/15 0810  NA 132* UNABLE TO PERFORM DUE TO SEVERE LIPEMIA 136 139 138  K 4.2 5.1 3.5 3.1* 3.0*  CL 98* 94* 105 108 106  CO2 22 19* 22 24 23   GLUCOSE 694* 891* 229* 98 102*  BUN 14 20 13 11 11   CREATININE 1.08 1.49* 1.00 0.81 0.75  CALCIUM 8.8* 8.8* 8.3* 8.4* 8.2*  MG  --   --  2.0  --   --     CBC:  Recent Labs Lab 03/31/15 2252 04/03/15 1720 04/04/15 0025  WBC 6.7 8.0 6.4  HGB 13.9 14.6 13.8  HCT 41.2 43.5 39.3   MCV 94.7 97.0 91.0  PLT 187 195 188    Liver Function Tests:  Recent Labs Lab 03/31/15 2252 04/03/15 1720  AST 20 19  ALT 17 17  ALKPHOS 86 94  BILITOT 0.6 0.5  PROT 6.4* UNABLE TO PERFORM DUE TO SEVERE LIPEMIA  ALBUMIN 4.0 3.9    Recent Labs Lab 04/03/15 1728  LIPASE 40   Cardiac Enzymes:  Recent Labs Lab 04/03/15 1720  TROPONINI <0.03    CBG:  Recent Labs Lab 04/04/15 0344 04/04/15 0428 04/04/15 0546 04/04/15 0732 04/04/15 1320  GLUCAP 69 73 101* 102* 258*    Recent Results (from the past 240 hour(s))  MRSA PCR Screening     Status: None   Collection Time: 04/03/15 11:34 PM  Result Value Ref Range Status   MRSA by PCR NEGATIVE NEGATIVE Final    Comment:        The GeneXpert MRSA Assay (FDA approved for NASAL specimens only), is one component of a comprehensive MRSA colonization surveillance program. It is not intended to diagnose MRSA infection nor to guide or monitor treatment for MRSA infections.      Studies:   Recent x-ray studies have been reviewed in detail by the Attending Physician  Scheduled Meds:  Scheduled Meds: . enoxaparin (LOVENOX) injection  40 mg Subcutaneous Q24H  . insulin aspart  0-15 Units Subcutaneous TID WC  . insulin aspart  0-5 Units Subcutaneous QHS  . insulin glargine  50 Units Subcutaneous BID    Time spent on care of this patient: no charge   Lonia Blood , MD   [facility] Office  [phone] Pager - Text Page per Amion as per below:  On-Call/Text Page:      [email or web address]      password [email or web address]  If 7PM-7AM, please contact night-coverage [email or web address] Password [email or web address] 04/04/2015, 2:34 PM   LOS: 1 day

## 2015-04-04 NOTE — Progress Notes (Signed)
Hypoglycemic Event  CBG: 56  Treatment: Dextrose 50 18 ML per glucostabilizer  Symptoms:  none  Follow-up CBG: Time: 0344 CBG Result:69  Possible Reasons for Event:   Comments/MD notified: NP Claiborne BillingsCallahan An additional 12 ml of Dextrose 50 was given per glucostabilizer with a follow up result of 73    Spencer [name], Spencer [name]  Remember to initiate Hypoglycemia Order Set & complete

## 2015-04-04 NOTE — Progress Notes (Signed)
Pt A/Ox4. VSS. No complaints. Report given to 5W and the patient will be transferred via wheelchair.  M.Foster SimpsonPiel, RN

## 2015-04-04 NOTE — H&P (Signed)
PCP:   No PCP Per Patient   Chief Complaint:  High sugar  HPI: 54 yo male h/o iddm, htn transferred from [place] due to lack of beds there for DKA.  ED there reported pt has compliance issues, patient denies this and says that his sugar is very labile for several months now.  Denies any sob or cp.  No fevers.  No n/v/d.  No illnessess.  No swelling in legs.  Pt initial gluc was over 800 now below 400 on insulin gtt.  Pt aggravated because he has been having this problem for 6 years.  Review of Systems:  Positive and negative as per HPI otherwise all other systems are negative  Past Medical History: Past Medical History  Diagnosis Date  . Diabetes mellitus without complication   . Hypertension   . Brain aneurysm    Past Surgical History  Procedure Laterality Date  . Neck surgery    . Hernia repair      Medications: Prior to Admission medications   Medication Sig Start Date End Date Taking? Authorizing Provider  cephALEXin (KEFLEX) 500 MG capsule Take 1 capsule (500 mg total) by mouth 2 (two) times daily. Patient not taking: Reported on 04/03/2015 04/01/15 04/11/15  Darci Current, MD  hydrochlorothiazide (MICROZIDE) 12.5 MG capsule Take 1 capsule by mouth daily. 04/23/14   Historical Provider, MD  ibuprofen (ADVIL,MOTRIN) 600 MG tablet Take 1 tablet (600 mg total) by mouth every 8 (eight) hours as needed. 03/25/15   Phineas Semen, MD  insulin aspart (NOVOLOG) 100 UNIT/ML FlexPen Inject 20 Units into the skin 3 (three) times daily with meals.  07/31/14   Historical Provider, MD  insulin glargine (LANTUS) 100 UNIT/ML injection Inject 50 Units into the skin 2 (two) times daily.  07/31/14   Historical Provider, MD  lisinopril (PRINIVIL,ZESTRIL) 20 MG tablet Take 1 tablet by mouth daily. 03/14/14   Historical Provider, MD  sitaGLIPtin (JANUVIA) 100 MG tablet Take 1 tablet (100 mg total) by mouth daily. 03/26/15   Irean Hong, MD    Allergies:  No Known Allergies  Social History:  reports that he has been smoking Cigarettes.  He has been smoking about 0.50 packs per day. He does not have any smokeless tobacco history on file. He reports that he drinks about 0.6 oz of alcohol per week. He reports that he does not use illicit drugs.  Family History: Family History  Problem Relation Age of Onset  . CAD Sister   . Hypertension Sister     Physical Exam: Filed Vitals:   04/03/15 2327  BP: 150/92  Temp: 97.3 F (36.3 C)  TempSrc: Oral  Resp: 11  Height: 6' (1.829 m)  Weight: 77.1 kg (169 lb 15.6 oz)  SpO2: 100%   General appearance: alert, cooperative and no distress Head: Normocephalic, without obvious abnormality, atraumatic Eyes: negative Nose: Nares normal. Septum midline. Mucosa normal. No drainage or sinus tenderness. Neck: no JVD and supple, symmetrical, trachea midline Lungs: clear to auscultation bilaterally Heart: regular rate and rhythm, S1, S2 normal, no murmur, click, rub or gallop Abdomen: soft, non-tender; bowel sounds normal; no masses,  no organomegaly Extremities: extremities normal, atraumatic, no cyanosis or edema Pulses: 2+ and symmetric Skin: Skin color, texture, turgor normal. No rashes or lesions Neurologic: Grossly normal    Labs on Admission:   Recent Labs  04/03/15 1720  NA UNABLE TO PERFORM DUE TO SEVERE LIPEMIA  K 5.1  CL 94*  CO2 19*  GLUCOSE 891*  BUN 20  CREATININE 1.49*  CALCIUM 8.8*    Recent Labs  04/03/15 1720  AST 19  ALT 17  ALKPHOS 94  BILITOT 0.5  PROT UNABLE TO PERFORM DUE TO SEVERE LIPEMIA  ALBUMIN 3.9    Recent Labs  04/03/15 1728  LIPASE 40    Recent Labs  04/03/15 1720  WBC 8.0  HGB 14.6  HCT 43.5  MCV 97.0  PLT 195    Recent Labs  04/03/15 1720  TROPONINI <0.03    Radiological Exams on Admission: Dg Chest 1 View  04/03/2015   CLINICAL DATA:  Left-sided chest pain for 3 weeks  EXAM: CHEST  1 VIEW  COMPARISON:  07/03/2010  FINDINGS: The heart size and mediastinal contours  are within normal limits. Both lungs are clear. The visualized skeletal structures are unremarkable. Postsurgical changes are noted in the cervical spine.  IMPRESSION: No active disease.   Electronically Signed   By: Alcide CleverMark  Lukens M.D.   On: 04/03/2015 20:30   ekg cxr reviewed Old records reviewed from outside facilty   Assessment/Plan  54 yo male with recurrent DKA  Principal Problem:   Diabetic ketoacidosis-  Insulin drip.  Hourly glucose checks.  Ck stat bmp now (na could not be measured at [facility]) and then q 4 hours.  Monitor gap and electrolytes closely.  Admit to stepdown unit.  Check hga1c.     Active Problems:   Hypertension-  stable   Depression- noted   Opiate dependence- stable  Admit to stepdown.  Obtain records from [place], pt reports he has been referred to an endocrinologist there.  Pt seen before midnight  DAVID,RACHAL A 04/04/2015, 12:09 AM

## 2015-04-04 NOTE — Progress Notes (Signed)
Patient arrived from [facility] alert and oriented X 4, VSS, no complaints at this time, resting comfortably in bed, will continue to monitor.

## 2015-04-05 DIAGNOSIS — I10 Essential (primary) hypertension: Secondary | ICD-10-CM

## 2015-04-05 DIAGNOSIS — E0810 Diabetes mellitus due to underlying condition with ketoacidosis without coma: Secondary | ICD-10-CM

## 2015-04-05 LAB — LIPID PANEL
CHOL/HDL RATIO: 4.1 ratio
Cholesterol: 141 mg/dL (ref 0–200)
HDL: 34 mg/dL — ABNORMAL LOW (ref >40)
LDL Cholesterol: 90 mg/dL (ref 0–99)
Triglycerides: 85 mg/dL (ref <150)
VLDL: 17 mg/dL (ref 0–40)

## 2015-04-05 LAB — COMPREHENSIVE METABOLIC PANEL
ALBUMIN: 3 g/dL — AB (ref 3.5–5.0)
ALT: 17 U/L (ref 17–63)
ANION GAP: 6 (ref 5–15)
AST: 19 U/L (ref 15–41)
Alkaline Phosphatase: 57 U/L (ref 38–126)
BUN: 12 mg/dL (ref 6–20)
CALCIUM: 8.7 mg/dL — AB (ref 8.9–10.3)
CO2: 24 mmol/L (ref 22–32)
Chloride: 108 mmol/L (ref 101–111)
Creatinine, Ser: 0.84 mg/dL (ref 0.61–1.24)
GFR calc Af Amer: >60 mL/min (ref >60)
GFR calc non Af Amer: >60 mL/min (ref >60)
GLUCOSE: 169 mg/dL — AB (ref 65–99)
POTASSIUM: 3.5 mmol/L (ref 3.5–5.1)
SODIUM: 138 mmol/L (ref 135–145)
TOTAL PROTEIN: 5.3 g/dL — AB (ref 6.5–8.1)
Total Bilirubin: 0.4 mg/dL (ref 0.3–1.2)

## 2015-04-05 LAB — CBC
HCT: 40.9 % (ref 39.0–52.0)
Hemoglobin: 14.2 g/dL (ref 13.0–17.0)
MCH: 31.3 pg (ref 26.0–34.0)
MCHC: 34.7 g/dL (ref 30.0–36.0)
MCV: 90.1 fL (ref 78.0–100.0)
Platelets: 205 10*3/uL (ref 150–400)
RBC: 4.54 MIL/uL (ref 4.22–5.81)
RDW: 12.9 % (ref 11.5–15.5)
WBC: 7.2 10*3/uL (ref 4.0–10.5)

## 2015-04-05 LAB — GLUCOSE, CAPILLARY
GLUCOSE-CAPILLARY: 80 mg/dL (ref 65–99)
GLUCOSE-CAPILLARY: 387 mg/dL — AB (ref 65–99)
Glucose-Capillary: 138 mg/dL — ABNORMAL HIGH (ref 65–99)
Glucose-Capillary: 68 mg/dL (ref 65–99)

## 2015-04-05 MED ORDER — INSULIN GLARGINE 100 UNIT/ML ~~LOC~~ SOLN
55.0000 [IU] | Freq: Two times a day (BID) | SUBCUTANEOUS | Status: DC
  Administered 2015-04-05: 55 [IU] via SUBCUTANEOUS
  Filled 2015-04-05 (×3): qty 0.55

## 2015-04-05 MED ORDER — INSULIN GLARGINE 100 UNIT/ML ~~LOC~~ SOLN: 60.0000 [IU] | Freq: Two times a day (BID) | SUBCUTANEOUS | Status: DC

## 2015-04-05 NOTE — Progress Notes (Signed)
Inpatient Diabetes Program Recommendations  AACE/ADA: New Consensus Statement on Inpatient Glycemic Control (2013)  Target Ranges:  Prepandial:   less than 140 mg/dL      Peak postprandial:   less than 180 mg/dL (1-2 hours)      Critically ill patients:  140 - 180 mg/dL   I do not know what patient does at home for meal coverage or correction nor what foods he eats. He states he does everything he is suppose to do. Thus far the meal coverage of 20 units tidwc appears effective to control. However, the resistant correction may cause some hypoglycemia as ac lunch today cbg was 80 mg/dL. Would rather use the meal coverage and basal insulin and use sensitive correction tidwc if needed. Pt must be extremely resistant or he is not eating a carbohydrate modified 3 meal/snacks a day nutritional plan. Will follow glucose pattern while here to better assess insulin dose needs  and talk with patient tomorrow.  Thank you Lenor CoffinAnn Aarian Griffie, RN, MSN, CDE  Diabetes Inpatient Program Office: [phone] Pager: [phone] 8:00 am to 5:00 pm

## 2015-04-05 NOTE — Progress Notes (Signed)
MD paged about CBG of 80. Asked if he wanted to give 20 units of scheduled novolog and was told to go ahead and give it. Patient refused the insulin at this time saying he felt shaky and did not want insulin right now. Pt requested to have his CBG rechecked in an hour after he eats to see what it is before receiving insulin. Will continue to monitor.

## 2015-04-05 NOTE — Progress Notes (Signed)
Offered patient [employer] resource. Patient stated that he goes to [facility] Greggory StallionGeorge [facility] [place]. Patient declined needing any medication assistance.

## 2015-04-05 NOTE — Progress Notes (Signed)
[facility] PROGRESS NOTE Interim History: 54 yo male h/o DM and HTN tansferred from [place] due to lack of beds there for DKA. ED there reported pt has compliance issues, patient denies this and says that his sugar is very labile for several months now. Pt initial gluc was over 800 now below 400 on insulin gtt. Pt aggravated because he has been having this problem for 6 years.   Assessment/Plan: Uncontrolled diabetes and DKA due to noncompliance: Now off insulin drip, blood glucose trending high increase. i will increase Lantus, add NovoLog 20 units with meals plus a scale insulin.  Essential hypertension: Resume lisinopril.  Cocaine abuse: Counseling  Code Status: full Family Communication: none  Disposition Plan: inpatient   Consultants:  none  Procedures:  none  Antibiotics:  None  HPI/Subjective: No compalins  Objective: Filed Vitals:   04/04/15 2000 04/04/15 2115 04/05/15 0551 04/05/15 0941  BP: 101/62 119/79 119/73 106/65  Pulse: 56 57 50   Temp:  97.7 F (36.5 C) 97.7 F (36.5 C)   TempSrc:  Oral Oral   Resp: 21 20 16    Height:  6' (1.829 m)    Weight:  80.3 kg (177 lb 0.5 oz)    SpO2: 99% 98% 100%     Intake/Output Summary (Last 24 hours) at 04/05/15 1124 Last data filed at 04/05/15 0740  Gross per 24 hour  Intake 532.67 ml  Output   4625 ml  Net -4092.33 ml   Filed Weights   04/03/15 2327 04/04/15 2115  Weight: 77.1 kg (169 lb 15.6 oz) 80.3 kg (177 lb 0.5 oz)    Exam:  General: Alert, awake, oriented x3, in no acute distress.  HEENT: No bruits, no goiter.  Heart: Regular rate and rhythm. Lungs: Good air movement, clear Abdomen: Soft, nontender, nondistended, positive bowel sounds.  Neuro: Grossly intact, nonfocal.   Data Reviewed: Basic Metabolic Panel:  Recent Labs Lab 04/03/15 1720 04/04/15 0025 04/04/15 0350 04/04/15 0810 04/05/15 0509  NA UNABLE TO PERFORM DUE TO SEVERE LIPEMIA 136 139 138 138  K 5.1 3.5  3.1* 3.0* 3.5  CL 94* 105 108 106 108  CO2 19* 22 24 23 24   GLUCOSE 891* 229* 98 102* 169*  BUN 20 13 11 11 12   CREATININE 1.49* 1.00 0.81 0.75 0.84  CALCIUM 8.8* 8.3* 8.4* 8.2* 8.7*  MG  --  2.0  --   --   --    Liver Function Tests:  Recent Labs Lab 03/31/15 2252 04/03/15 1720 04/05/15 0509  AST 20 19 19   ALT 17 17 17   ALKPHOS 86 94 57  BILITOT 0.6 0.5 0.4  PROT 6.4* UNABLE TO PERFORM DUE TO SEVERE LIPEMIA 5.3*  ALBUMIN 4.0 3.9 3.0*    Recent Labs Lab 04/03/15 1728  LIPASE 40   No results for input(s): AMMONIA in the last 168 hours. CBC:  Recent Labs Lab 03/31/15 2252 04/03/15 1720 04/04/15 0025 04/05/15 0509  WBC 6.7 8.0 6.4 7.2  HGB 13.9 14.6 13.8 14.2  HCT 41.2 43.5 39.3 40.9  MCV 94.7 97.0 91.0 90.1  PLT 187 195 188 205   Cardiac Enzymes:  Recent Labs Lab 04/03/15 1720  TROPONINI <0.03   BNP (last 3 results) No results for input(s): BNP in the last 8760 hours.  ProBNP (last 3 results) No results for input(s): PROBNP in the last 8760 hours.  CBG:  Recent Labs Lab 04/04/15 0732 04/04/15 1320 04/04/15 1722 04/04/15 2138 04/05/15 0733  GLUCAP 102* 258* 158*  121* 138*    Recent Results (from the past 240 hour(s))  MRSA PCR Screening     Status: None   Collection Time: 04/03/15 11:34 PM  Result Value Ref Range Status   MRSA by PCR NEGATIVE NEGATIVE Final    Comment:        The GeneXpert MRSA Assay (FDA approved for NASAL specimens only), is one component of a comprehensive MRSA colonization surveillance program. It is not intended to diagnose MRSA infection nor to guide or monitor treatment for MRSA infections.      Studies: Dg Chest 1 View  04/03/2015   CLINICAL DATA:  Left-sided chest pain for 3 weeks  EXAM: CHEST  1 VIEW  COMPARISON:  07/03/2010  FINDINGS: The heart size and mediastinal contours are within normal limits. Both lungs are clear. The visualized skeletal structures are unremarkable. Postsurgical changes are noted  in the cervical spine.  IMPRESSION: No active disease.   Electronically Signed   By: Alcide Clever M.D.   On: 04/03/2015 20:30    Scheduled Meds: . aspirin EC  81 mg Oral Daily  . enoxaparin (LOVENOX) injection  40 mg Subcutaneous Q24H  . insulin aspart  0-20 Units Subcutaneous TID WC  . insulin aspart  0-5 Units Subcutaneous QHS  . insulin aspart  20 Units Subcutaneous TID WC  . insulin glargine  55 Units Subcutaneous BID  . lisinopril  20 mg Oral Daily   Continuous Infusions: . sodium chloride 50 mL/hr at 04/04/15 1541    Time Spent: 15 min   Marinda Elk  [facility] Pager [phone]. If 7PM-7AM, please contact night-coverage at [email or web address], password [facility] 04/05/2015, 11:24 AM  LOS: 2 days

## 2015-04-05 NOTE — Progress Notes (Signed)
Utilization Review completed. Onalee Steinbach RN BSN CM 

## 2015-04-06 DIAGNOSIS — F1120 Opioid dependence, uncomplicated: Secondary | ICD-10-CM

## 2015-04-06 LAB — GLUCOSE, CAPILLARY
Glucose-Capillary: 96 mg/dL (ref 65–99)
Glucose-Capillary: >600 mg/dL (ref 65–99)

## 2015-04-06 LAB — HEMOGLOBIN A1C
Hgb A1c MFr Bld: 12.1 % — ABNORMAL HIGH (ref 4.8–5.6)
MEAN PLASMA GLUCOSE: 301 mg/dL

## 2015-04-06 MED ORDER — INSULIN ASPART 100 UNIT/ML FLEXPEN: 10.0000 [IU] | PEN_INJECTOR | Freq: Three times a day (TID) | SUBCUTANEOUS | Status: DC

## 2015-04-06 MED ORDER — INSULIN GLARGINE 100 UNIT/ML ~~LOC~~ SOLN
65.0000 [IU] | Freq: Two times a day (BID) | SUBCUTANEOUS | Status: DC
  Administered 2015-04-06: 65 [IU] via SUBCUTANEOUS
  Filled 2015-04-06 (×2): qty 0.65

## 2015-04-06 MED ORDER — INSULIN GLARGINE 100 UNIT/ML ~~LOC~~ SOLN: 65.0000 [IU] | Freq: Two times a day (BID) | SUBCUTANEOUS | Status: DC

## 2015-04-06 MED ORDER — INSULIN ASPART 100 UNIT/ML ~~LOC~~ SOLN: 10.0000 [IU] | Freq: Three times a day (TID) | SUBCUTANEOUS | Status: DC

## 2015-04-06 MED ORDER — ASPIRIN 81 MG PO TBEC: 81.0000 mg | DELAYED_RELEASE_TABLET | Freq: Every day | ORAL | Status: DC

## 2015-04-06 NOTE — Progress Notes (Signed)
Inpatient Diabetes Program Recommendations  AACE/ADA: New Consensus Statement on Inpatient Glycemic Control (2013)  Target Ranges:  Prepandial:   less than 140 mg/dL      Peak postprandial:   less than 180 mg/dL (1-2 hours)      Critically ill patients:  140 - 180 mg/dL     Results for Spencer [name], Ustin [name] (MRN [id number]) as of 04/06/2015 08:19  Ref. Range 04/05/2015 07:33 04/05/2015 11:40 04/05/2015 16:35 04/05/2015 21:17  Glucose-Capillary Latest Ref Range: 65-99 mg/dL 409138 (H) 80 811387 (H) 68    Results for Spencer [name], Spencer [name] (MRN [id number]) as of 04/06/2015 08:19  Ref. Range 04/06/2015 07:43  Glucose-Capillary Latest Ref Range: 65-99 mg/dL 96     Home DM Meds: Lantus 50 units bid       Novolog 20 units tidwc       Januvia 100 mg daily  Current DM Orders: Lantus 55 units bid            Novolog Resistant SSI tid ac + HS            Novolog 20 units tidwc    MD- Patient has been refusing Novolog Meal Coverage (20 units tidwc) at times.  Refused 20 units Novolog yesterday at lunch when CBG was 80 mg/dl, patient then ate lunch and then CBG rose to 387 mg/dl by dinner time.  Note patient has refused Novolog 20 units this AM as well.  Expect CBG at 12PM today to be elevated since patient is eating 100% of meals.   MD- Please consider the following insulin adjustments:  1. Decrease Lantus back to 50 units bid (home dose) since AM CBG today was 96 mg/dl  2. Decrease Novolog SSI to Moderate scale tid ac + HS (currently ordered as Resistant scale)  3. Consider reducing Novolog Meal Coverage to 17 units tidwc    Will follow Ambrose FinlandJeannine [name] Chisum Habenicht RN, MSN, CDE Diabetes Coordinator Inpatient Diabetes Program Team Pager: [phone] (8a-5p)

## 2015-04-06 NOTE — Progress Notes (Signed)
Patient discharged per orders. All d/c orders/instructions/follow up care/medications/prescriptions discussed. Patient stated he will check his blood sugar when he gets home. Patient refused wheelchair to main entrance. Patient will be escorted to main [facility] Dev[facility]. Per patient his sister will pick him up. Asher Muir- Camerin Jimenez,RN

## 2015-04-06 NOTE — Discharge Instructions (Signed)
Spencer SpireRoger Alyea was admitted to the Hospital on 04/03/2015 and Discharged on Discharge Date 04/06/2015 and should be excused from work/school   for 4  days starting 04/03/2015 , may return to work/school without any restrictions.  Call Lambert KetoAbraham Feliz MD, [facility] [phone] with questions.  Marinda ElkFELIZ ORTIZ, Kevan Prouty M.D on 04/06/2015,at 8:30 AM  [facility] Office  [phone]

## 2015-04-06 NOTE — Discharge Summary (Signed)
Physician Discharge Summary  Spencer [name] NUU:[id number] DOB: 11/24/1960 DOA: 04/03/2015  PCP: No PCP Per Patient  Admit date: 04/03/2015 Discharge date: 04/06/2015  Time spent: 35 minutes  Recommendations for Outpatient Follow-up:  1. Follow up with PCP in 1 week.  Discharge Diagnoses:  Principal Problem:   Diabetic ketoacidosis Active Problems:   DKA (diabetic ketoacidoses)   Hypertension   Depression   Opiate dependence   Discharge Condition: stable  Diet recommendation: ADA diet  Filed Weights   04/03/15 2327 04/04/15 2115  Weight: 77.1 kg (169 lb 15.6 oz) 80.3 kg (177 lb 0.5 oz)    History of present illness:  54 yo male h/o iddm, htn transferred from [place] due to lack of beds there for DKA. ED there reported pt has compliance issues, patient denies this and says that his sugar is very labile for several months now. Denies any sob or cp. No fevers. No n/v/d. No illnessess. No swelling in legs. Pt initial gluc was over 800 now below 400 on insulin gtt. Pt aggravated because he has been having this problem for 6 years.  Hospital Course:  Uncontrolled diabetes and DKA due to noncompliance: - started on IV insulin until his bicarbonate is greater than 20 and anion gap was closed. - Patient required high-dose insulin so his Lantus was increased to 65 which he would take twice a day and his NovoLog was decreased to 10 units twice a day. - We'll continue his Januvia.  Essential hypertension: Resume lisinopril.  Cocaine abuse: Counseling  Procedures:  Chest x-ray  Consultations:  None  Discharge Exam: Filed Vitals:   04/06/15 0633  BP:   Pulse: 54  Temp:   Resp:     General: Awake alert and oriented 3 Cardiovascular: Rate and rhythm Respiratory: Good air movement and clear to auscultation  Discharge Instructions   Discharge Instructions    Diet - low sodium heart healthy    Complete by:  As directed      Increase activity slowly    Complete  by:  As directed           Current Discharge Medication List    START taking these medications   Details  aspirin EC 81 MG EC tablet Take 1 tablet (81 mg total) by mouth daily. Qty: 30 tablet, Refills: 0      CONTINUE these medications which have CHANGED   Details  insulin aspart (NOVOLOG) 100 UNIT/ML FlexPen Inject 10 Units into the skin 3 (three) times daily with meals. Qty: 15 mL, Refills: 11    insulin glargine (LANTUS) 100 UNIT/ML injection Inject 0.65 mLs (65 Units total) into the skin 2 (two) times daily. Qty: 10 mL, Refills: 11      CONTINUE these medications which have NOT CHANGED   Details  lisinopril (PRINIVIL,ZESTRIL) 20 MG tablet Take 1 tablet by mouth daily.    hydrochlorothiazide (MICROZIDE) 12.5 MG capsule Take 1 capsule by mouth daily.    ibuprofen (ADVIL,MOTRIN) 600 MG tablet Take 1 tablet (600 mg total) by mouth every 8 (eight) hours as needed. Qty: 20 tablet, Refills: 0    sitaGLIPtin (JANUVIA) 100 MG tablet Take 1 tablet (100 mg total) by mouth daily. Qty: 30 tablet, Refills: 0      STOP taking these medications     cephALEXin (KEFLEX) 500 MG capsule        No Known Allergies    The results of significant diagnostics from this hospitalization (including imaging, microbiology, ancillary and laboratory) are  listed below for reference.    Significant Diagnostic Studies: Dg Chest 1 View  04/03/2015   CLINICAL DATA:  Left-sided chest pain for 3 weeks  EXAM: CHEST  1 VIEW  COMPARISON:  07/03/2010  FINDINGS: The heart size and mediastinal contours are within normal limits. Both lungs are clear. The visualized skeletal structures are unremarkable. Postsurgical changes are noted in the cervical spine.  IMPRESSION: No active disease.   Electronically Signed   By: Alcide CleverMark  Lukens M.D.   On: 04/03/2015 20:30    Microbiology: Recent Results (from the past 240 hour(s))  MRSA PCR Screening     Status: None   Collection Time: 04/03/15 11:34 PM  Result Value  Ref Range Status   MRSA by PCR NEGATIVE NEGATIVE Final    Comment:        The GeneXpert MRSA Assay (FDA approved for NASAL specimens only), is one component of a comprehensive MRSA colonization surveillance program. It is not intended to diagnose MRSA infection nor to guide or monitor treatment for MRSA infections.      Labs: Basic Metabolic Panel:  Recent Labs Lab 04/03/15 1720 04/04/15 0025 04/04/15 0350 04/04/15 0810 04/05/15 0509  NA UNABLE TO PERFORM DUE TO SEVERE LIPEMIA 136 139 138 138  K 5.1 3.5 3.1* 3.0* 3.5  CL 94* 105 108 106 108  CO2 19* 22 24 23 24   GLUCOSE 891* 229* 98 102* 169*  BUN 20 13 11 11 12   CREATININE 1.49* 1.00 0.81 0.75 0.84  CALCIUM 8.8* 8.3* 8.4* 8.2* 8.7*  MG  --  2.0  --   --   --    Liver Function Tests:  Recent Labs Lab 03/31/15 2252 04/03/15 1720 04/05/15 0509  AST 20 19 19   ALT 17 17 17   ALKPHOS 86 94 57  BILITOT 0.6 0.5 0.4  PROT 6.4* UNABLE TO PERFORM DUE TO SEVERE LIPEMIA 5.3*  ALBUMIN 4.0 3.9 3.0*    Recent Labs Lab 04/03/15 1728  LIPASE 40   No results for input(s): AMMONIA in the last 168 hours. CBC:  Recent Labs Lab 03/31/15 2252 04/03/15 1720 04/04/15 0025 04/05/15 0509  WBC 6.7 8.0 6.4 7.2  HGB 13.9 14.6 13.8 14.2  HCT 41.2 43.5 39.3 40.9  MCV 94.7 97.0 91.0 90.1  PLT 187 195 188 205   Cardiac Enzymes:  Recent Labs Lab 04/03/15 1720  TROPONINI <0.03   BNP: BNP (last 3 results) No results for input(s): BNP in the last 8760 hours.  ProBNP (last 3 results) No results for input(s): PROBNP in the last 8760 hours.  CBG:  Recent Labs Lab 04/05/15 0733 04/05/15 1140 04/05/15 1635 04/05/15 2117 04/06/15 0743  GLUCAP 138* 80 387* 68 96       Signed:  FELIZ [name], Spencer [name]  [facility] 04/06/2015, 8:28 AM

## 2015-05-03 ENCOUNTER — Emergency Department
Admission: EM | Admit: 2015-05-03 | Discharge: 2015-05-03 | Disposition: A | Discharge status: Home / Self Care | Payer: Medicaid Other | Attending: Emergency Medicine | Admitting: Emergency Medicine

## 2015-05-03 ENCOUNTER — Encounter: Payer: Self-pay | Admitting: Emergency Medicine

## 2015-05-03 DIAGNOSIS — I10 Essential (primary) hypertension: Secondary | ICD-10-CM | POA: Diagnosis not present

## 2015-05-03 DIAGNOSIS — Z7982 Long term (current) use of aspirin: Secondary | ICD-10-CM | POA: Diagnosis not present

## 2015-05-03 DIAGNOSIS — Z79899 Other long term (current) drug therapy: Secondary | ICD-10-CM | POA: Diagnosis not present

## 2015-05-03 DIAGNOSIS — Z794 Long term (current) use of insulin: Secondary | ICD-10-CM | POA: Diagnosis not present

## 2015-05-03 DIAGNOSIS — E1165 Type 2 diabetes mellitus with hyperglycemia: Secondary | ICD-10-CM | POA: Diagnosis present

## 2015-05-03 DIAGNOSIS — Z720 Tobacco use: Secondary | ICD-10-CM | POA: Insufficient documentation

## 2015-05-03 DIAGNOSIS — R0602 Shortness of breath: Secondary | ICD-10-CM | POA: Insufficient documentation

## 2015-05-03 DIAGNOSIS — H538 Other visual disturbances: Secondary | ICD-10-CM | POA: Diagnosis not present

## 2015-05-03 DIAGNOSIS — R739 Hyperglycemia, unspecified: Secondary | ICD-10-CM

## 2015-05-03 LAB — COMPREHENSIVE METABOLIC PANEL
ALBUMIN: 4.4 g/dL (ref 3.5–5.0)
ALK PHOS: 80 U/L (ref 38–126)
ALT: 16 U/L — ABNORMAL LOW (ref 17–63)
ANION GAP: 12 (ref 5–15)
AST: 19 U/L (ref 15–41)
BILIRUBIN TOTAL: 0.5 mg/dL (ref 0.3–1.2)
BUN: 14 mg/dL (ref 6–20)
CHLORIDE: 101 mmol/L (ref 101–111)
CO2: 20 mmol/L — ABNORMAL LOW (ref 22–32)
Calcium: 9.1 mg/dL (ref 8.9–10.3)
Creatinine, Ser: 0.95 mg/dL (ref 0.61–1.24)
GFR calc Af Amer: >60 mL/min (ref >60)
GFR calc non Af Amer: >60 mL/min (ref >60)
Glucose, Bld: 683 mg/dL (ref 65–99)
POTASSIUM: 4.2 mmol/L (ref 3.5–5.1)
SODIUM: 133 mmol/L — AB (ref 135–145)
Total Protein: 7.1 g/dL (ref 6.5–8.1)

## 2015-05-03 LAB — URINALYSIS COMPLETE WITH MICROSCOPIC (ARMC ONLY)
BACTERIA UA: NONE SEEN
Bilirubin Urine: NEGATIVE
Glucose, UA: >500 mg/dL — AB
Hgb urine dipstick: NEGATIVE
Ketones, ur: NEGATIVE mg/dL
Leukocytes, UA: NEGATIVE
Nitrite: NEGATIVE
Protein, ur: NEGATIVE mg/dL
RBC / HPF: NONE SEEN RBC/hpf (ref 0–5)
Specific Gravity, Urine: 1.022 (ref 1.005–1.030)
Squamous Epithelial / LPF: NONE SEEN
WBC UA: NONE SEEN WBC/hpf (ref 0–5)
pH: 6 (ref 5.0–8.0)

## 2015-05-03 LAB — CBC
HEMATOCRIT: 44.4 % (ref 40.0–52.0)
HEMOGLOBIN: 14.9 g/dL (ref 13.0–18.0)
MCH: 32.5 pg (ref 26.0–34.0)
MCHC: 33.6 g/dL (ref 32.0–36.0)
MCV: 96.7 fL (ref 80.0–100.0)
Platelets: 194 10*3/uL (ref 150–440)
RBC: 4.59 MIL/uL (ref 4.40–5.90)
RDW: 14 % (ref 11.5–14.5)
WBC: 8 10*3/uL (ref 3.8–10.6)

## 2015-05-03 LAB — GLUCOSE, CAPILLARY
GLUCOSE-CAPILLARY: 260 mg/dL — AB (ref 65–99)
Glucose-Capillary: 597 mg/dL (ref 65–99)
Glucose-Capillary: >600 mg/dL (ref 65–99)
Glucose-Capillary: >600 mg/dL (ref 65–99)

## 2015-05-03 LAB — CBG MONITORING, ED: Glucose, fingerstick: 260

## 2015-05-03 MED ORDER — INSULIN ASPART 100 UNIT/ML ~~LOC~~ SOLN
10.0000 [IU] | Freq: Once | SUBCUTANEOUS | Status: AC
  Administered 2015-05-03: 10 [IU] via INTRAVENOUS

## 2015-05-03 MED ORDER — SODIUM CHLORIDE 0.9 % IV BOLUS (SEPSIS)
1000.0000 mL | Freq: Once | INTRAVENOUS | Status: AC
  Administered 2015-05-03: 1000 mL via INTRAVENOUS

## 2015-05-03 MED ORDER — INSULIN ASPART 100 UNIT/ML ~~LOC~~ SOLN
SUBCUTANEOUS | Status: AC
  Administered 2015-05-03: 10 [IU] via INTRAVENOUS
  Filled 2015-05-03: qty 10

## 2015-05-03 NOTE — ED Notes (Signed)
Patient states that his blood sugar has been elevated above 300 and tonight it was 576 at home.

## 2015-05-03 NOTE — ED Notes (Signed)
MD at bedside. 

## 2015-05-03 NOTE — Discharge Instructions (Signed)
As we discussed, although your blood sugar was in the upper 600s today, he had none of the potentially dangerous complications, such as DKA, for which she recently sent to [facility].  Your blood sugar is now down in the 200s after IV fluids and some additional insulin.  We recommend that you continue taking your regular medications including the Januvia that you were prescribed during your hospitalization.  It is very important that you follow up with your primary care doctor about your medications and to try to facilitate a referral to endocrinology.  We put a note in the computer to try to help with referral to endocrinology, but these situations are typically best handled by a primary care provider.  Please be careful about your diet and refer to the documentation URD have at home about diabetes nutrition.  Return to the emergency department with new or worsening symptoms that concern you.   Hyperglycemia Hyperglycemia occurs when the glucose (sugar) in your blood is too high. Hyperglycemia can happen for many reasons, but it most often happens to people who do not know they have diabetes or are not managing their diabetes properly.  CAUSES  Whether you have diabetes or not, there are other causes of hyperglycemia. Hyperglycemia can occur when you have diabetes, but it can also occur in other situations that you might not be as aware of, such as: Diabetes  If you have diabetes and are having problems controlling your blood glucose, hyperglycemia could occur because of some of the following reasons:  Not following your meal plan.  Not taking your diabetes medications or not taking it properly.  Exercising less or doing less activity than you normally do.  Being sick. Pre-diabetes  This cannot be ignored. Before people develop Type 2 diabetes, they almost always have "pre-diabetes." This is when your blood glucose levels are higher than normal, but not yet high enough to be diagnosed as  diabetes. Research has shown that some long-term damage to the body, especially the heart and circulatory system, may already be occurring during pre-diabetes. If you take action to manage your blood glucose when you have pre-diabetes, you may delay or prevent Type 2 diabetes from developing. Stress  If you have diabetes, you may be "diet" controlled or on oral medications or insulin to control your diabetes. However, you may find that your blood glucose is higher than usual in the hospital whether you have diabetes or not. This is often referred to as "stress hyperglycemia." Stress can elevate your blood glucose. This happens because of hormones put out by the body during times of stress. If stress has been the cause of your high blood glucose, it can be followed regularly by your caregiver. That way he/she can make sure your hyperglycemia does not continue to get worse or progress to diabetes. Steroids  Steroids are medications that act on the infection fighting system (immune system) to block inflammation or infection. One side effect can be a rise in blood glucose. Most people can produce enough extra insulin to allow for this rise, but for those who cannot, steroids make blood glucose levels go even higher. It is not unusual for steroid treatments to "uncover" diabetes that is developing. It is not always possible to determine if the hyperglycemia will go away after the steroids are stopped. A special blood test called an A1c is sometimes done to determine if your blood glucose was elevated before the steroids were started. SYMPTOMS  Thirsty.  Frequent urination.  Dry  mouth.  Blurred vision.  Tired or fatigue.  Weakness.  Sleepy.  Tingling in feet or leg. DIAGNOSIS  Diagnosis is made by monitoring blood glucose in one or all of the following ways:  A1c test. This is a chemical found in your blood.  Fingerstick blood glucose monitoring.  Laboratory results. TREATMENT  First,  knowing the cause of the hyperglycemia is important before the hyperglycemia can be treated. Treatment may include, but is not be limited to:  Education.  Change or adjustment in medications.  Change or adjustment in meal plan.  Treatment for an illness, infection, etc.  More frequent blood glucose monitoring.  Change in exercise plan.  Decreasing or stopping steroids.  Lifestyle changes. HOME CARE INSTRUCTIONS   Test your blood glucose as directed.  Exercise regularly. Your caregiver will give you instructions about exercise. Pre-diabetes or diabetes which comes on with stress is helped by exercising.  Eat wholesome, balanced meals. Eat often and at regular, fixed times. Your caregiver or nutritionist will give you a meal plan to guide your sugar intake.  Being at an ideal weight is important. If needed, losing as little as 10 to 15 pounds may help improve blood glucose levels. SEEK MEDICAL CARE IF:   You have questions about medicine, activity, or diet.  You continue to have symptoms (problems such as increased thirst, urination, or weight gain). SEEK IMMEDIATE MEDICAL CARE IF:   You are vomiting or have diarrhea.  Your breath smells fruity.  You are breathing faster or slower.  You are very sleepy or incoherent.  You have numbness, tingling, or pain in your feet or hands.  You have chest pain.  Your symptoms get worse even though you have been following your caregiver's orders.  If you have any other questions or concerns. Document Released: 04/18/2001 Document Revised: 01/15/2012 Document Reviewed: 02/19/2012 [facility] Patient Information 2015 [place], [place]. This information is not intended to replace advice given to you by your health care provider. Make sure you discuss any questions you have with your health care provider.  Correction Insulin Your health care provider has decided you need to take insulin regularly. You have been given a correction scale  (also called a sliding scale) in case you need extra insulin when your blood sugar is too high (hyperglycemia). The following instructions will assist you in how to use that correction scale.  WHAT IS A CORRECTION SCALE?  When you check your blood sugar, sometimes it will be higher than your health care provider has told you it should be. You may need an extra dose of insulin to bring your blood sugar to the recommended level (also known as your goal, target, or normal level). The correction scale is prescribed by your health care provider based on your specific needs.  Your correction scale has two parts:   The first shows you a blood sugar range.   The second part tells you how much extra insulin to give yourself if your blood sugar falls within this range. You will not need an extra dose of insulin if your blood glucose is in the desired range. You should simply give yourself the normal amount of insulin that your health care provider has ordered for you.  WHY IS IT IMPORTANT TO KEEP YOUR BLOOD SUGAR LEVELS AT YOUR DESIRED LEVEL?  Keeping your blood sugar at the desired level helps to prevent long-term complications of diabetes, such as eye disease, kidney failure, nerve damage, and other serious complications. WHAT TYPE OF INSULIN  WILL YOU USE?  To help bring down blood sugar levels that are too high, your health care provider will prescribe a short-acting or a rapid-acting insulin. An example of a short-acting insulin would be regular insulin. Remember, you may also have a longer-acting insulin prescribed for you.  WHAT DO YOU NEED TO DO?   Check your blood sugar with your home blood glucose meter as recommended by your health care provider.   Using your correction scale, find the range that your blood sugar lies in.   Look for the units of insulin that match that blood sugar range. Give yourself the dose of correction insulin your health care provider has prescribed. Always make  sure you are using the right type of insulin.   Prior to the injection, make sure you have food available that you can eat in the next 15-30 minutes.   If your correction insulin is rapid acting, start eating your meal within 15 minutes after you have given yourself the insulin injection. If you wait longer than 15 minutes to eat, your blood sugar might get too low.   If your correction insulin is short acting(regular), start eating your meal within 30 minutes after you have given yourself the insulin injection. If you wait longer than 30 minutes to eat, your blood sugar might get too low. Symptoms of low blood sugar (hypoglycemia) may include feeling shaky or weak, sweating, feeling confused, difficulty seeing, agitation, crankiness, or numbness of the lips or tongue. Check your blood sugar immediately and treat your results as directed by your health care provider.   Keep a log of your blood sugar results with the time you took the test and the amount of insulin that you injected. This information will help your health care provider manage your medicines.   Note on your log anything that may affect your blood sugar level, such as:   Changes in normal exercise or activity.   Changes in your normal schedule, such as staying up late, going on vacation, changing your diet, or holidays.   New medicines. This includes prescription and over-the-counter medicines. Some medicines may cause high blood sugar.   Sickness, stress, or anxiety.   Changes in the time you took your medicine.   Changes in your meals, such as skipping a meal, having a late meal, or dining out.   Eating things that may affect blood glucose, such as snacks, meal portions that are larger than normal, drinks with sugar, or eating less than usual.   Ask your health care provider any questions you have.  Be aware of "stacking" your insulin doses. This happens when you correct a high blood sugar level by giving  yourself extra insulin too soon after a previous correction dose or mealtime dose. You may then have too much insulin still active in your body and may be at risk for hypoglycemia. WHY DO YOU NEED A CORRECTION SCALE IF YOU HAVE NEVER BEEN DIAGNOSED WITH DIABETES?   Keeping your blood glucose in the target range is important for your overall health.   You may have been prescribed medicines that cause your blood glucose to be higher than normal. WHEN SHOULD YOU SEEK MEDICAL CARE? Contact your health care provider if:   You have experienced hypoglycemia that you are unable to treat with your usual routine.   You have a high blood sugar level that is not coming down with the correction dose.  Your blood sugar is often too low or does not come  up even if you eat a fast-acting carbohydrate. Someone who lives with you should seek immediate medical care if you become unresponsive. Document Released: 03/16/2011 Document Revised: 06/25/2013 Document Reviewed: 04/04/2013 [facility] Patient Information 2015 [place], [place]. This information is not intended to replace advice given to you by your health care provider. Make sure you discuss any questions you have with your health care provider.  Blood Glucose Monitoring Monitoring your blood glucose (also know as blood sugar) helps you to manage your diabetes. It also helps you and your health care provider monitor your diabetes and determine how well your treatment plan is working. WHY SHOULD YOU MONITOR YOUR BLOOD GLUCOSE?  It can help you understand how food, exercise, and medicine affect your blood glucose.  It allows you to know what your blood glucose is at any given moment. You can quickly tell if you are having low blood glucose (hypoglycemia) or high blood glucose (hyperglycemia).  It can help you and your health care provider know how to adjust your medicines.  It can help you understand how to manage an illness or adjust medicine for  exercise. WHEN SHOULD YOU TEST? Your health care provider will help you decide how often you should check your blood glucose. This may depend on the type of diabetes you have, your diabetes control, or the types of medicines you are taking. Be sure to write down all of your blood glucose readings so that this information can be reviewed with your health care provider. See below for examples of testing times that your health care provider may suggest. Type 1 Diabetes  Test 4 times a day if you are in good control, using an insulin pump, or perform multiple daily injections.  If your diabetes is not well controlled or if you are sick, you may need to monitor more often.  It is a good idea to also monitor:  Before and after exercise.  Between meals and 2 hours after a meal.  Occasionally between 2:00 a.m. and 3:00 a.m. Type 2 Diabetes  It can vary with each person, but generally, if you are on insulin, test 4 times a day.  If you take medicines by mouth (orally), test 2 times a day.  If you are on a controlled diet, test once a day.  If your diabetes is not well controlled or if you are sick, you may need to monitor more often. HOW TO MONITOR YOUR BLOOD GLUCOSE Supplies Needed  Blood glucose meter.  Test strips for your meter. Each meter has its own strips. You must use the strips that go with your own meter.  A pricking needle (lancet).  A device that holds the lancet (lancing device).  A journal or log book to write down your results. Procedure  Wash your hands with soap and water. Alcohol is not preferred.  Prick the side of your finger (not the tip) with the lancet.  Gently milk the finger until a small drop of blood appears.  Follow the instructions that come with your meter for inserting the test strip, applying blood to the strip, and using your blood glucose meter. Other Areas to Get Blood for Testing Some meters allow you to use other areas of your body (other  than your finger) to test your blood. These areas are called alternative sites. The most common alternative sites are:  The forearm.  The thigh.  The back area of the lower leg.  The palm of the hand. The blood flow in these  areas is slower. Therefore, the blood glucose values you get may be delayed, and the numbers are different from what you would get from your fingers. Do not use alternative sites if you think you are having hypoglycemia. Your reading will not be accurate. Always use a finger if you are having hypoglycemia. Also, if you cannot feel your lows (hypoglycemia unawareness), always use your fingers for your blood glucose checks. ADDITIONAL TIPS FOR GLUCOSE MONITORING  Do not reuse lancets.  Always carry your supplies with you.  All blood glucose meters have a 24-hour "hotline" number to call if you have questions or need help.  Adjust (calibrate) your blood glucose meter with a control solution after finishing a few boxes of strips. BLOOD GLUCOSE RECORD KEEPING It is a good idea to keep a daily record or log of your blood glucose readings. Most glucose meters, if not all, keep your glucose records stored in the meter. Some meters come with the ability to download your records to your home computer. Keeping a record of your blood glucose readings is especially helpful if you are wanting to look for patterns. Make notes to go along with the blood glucose readings because you might forget what happened at that exact time. Keeping good records helps you and your health care provider to work together to achieve good diabetes management.  Document Released: 10/26/2003 Document Revised: 03/09/2014 Document Reviewed: 03/17/2013 [facility] Patient Information 2015 [place], [place]. This information is not intended to replace advice given to you by your health care provider. Make sure you discuss any questions you have with your health care provider.

## 2015-05-03 NOTE — ED Notes (Signed)
Pt uprite on stretcher in exam room with no distress noted; pt  Reports FSBS >300 at home accomp by generalized HA; denies any other c/o; denies any recent illness; pt A&Ox3, MAEW, PERRL

## 2015-05-03 NOTE — ED Provider Notes (Signed)
[facility] Emergency Department Provider Note  ____________________________________________  Time seen: Approximately 3:37 AM  I have reviewed the triage vital signs and the nursing notes.   HISTORY  Chief Complaint Hyperglycemia    HPI Spencer [name] is a 54 y.o. male with a history of refractory hyperglycemia/diabetes who presents with gradual onset and persistent generalized fatigue and weaknessfor several days in the setting of persistent hypoglycemia.  He had a recent admission and [facility] to [facility] for DKA during which his blood sugar was in the 900s and his anion gap was greater than 20.  I reviewed his discharge summary and according to the summary is supposed to be taking both Lantus, NovoLog, and Januvia.  He has a PCP, Dr. Greggory StallionGeorge, but has not yet been able to see an endocrinologist for assistance managing his diabetes.  He denies abdominal pain, nausea/vomiting, chest pain, shortness of breath, fever/chills.  He does admit to multiple dietary indiscretions but does not believe that explains why his blood sugar continues to be high.  He also states that he has not been taking his Januvia as recommended on his discharge summary because he believed that somebody told him not to.  Past Medical History  Diagnosis Date  . Diabetes mellitus without complication   . Hypertension   . Brain aneurysm     Patient Active Problem List   Diagnosis Date Noted  . Diabetic ketoacidosis 04/03/2015  . DKA (diabetic ketoacidoses) 04/03/2015  . Hypertension 04/03/2015  . Depression 04/03/2015  . Opiate dependence 04/03/2015  . Tobacco abuse 04/03/2015    Past Surgical History  Procedure Laterality Date  . Neck surgery    . Hernia repair      Current Outpatient Rx  Name  Route  Sig  Dispense  Refill  . aspirin EC 81 MG EC tablet   Oral   Take 1 tablet (81 mg total) by mouth daily.   30 tablet   0   . hydrochlorothiazide (MICROZIDE) 12.5 MG  capsule   Oral   Take 1 capsule by mouth daily.         Marland Kitchen. ibuprofen (ADVIL,MOTRIN) 600 MG tablet   Oral   Take 1 tablet (600 mg total) by mouth every 8 (eight) hours as needed. Patient taking differently: Take 600 mg by mouth every 8 (eight) hours as needed for mild pain.    20 tablet   0   . insulin aspart (NOVOLOG) 100 UNIT/ML FlexPen   Subcutaneous   Inject 10 Units into the skin 3 (three) times daily with meals. Patient taking differently: Inject 20 Units into the skin 3 (three) times daily with meals.    15 mL   11   . insulin glargine (LANTUS) 100 UNIT/ML injection   Subcutaneous   Inject 0.65 mLs (65 Units total) into the skin 2 (two) times daily. Patient taking differently: Inject 60 Units into the skin 2 (two) times daily.    10 mL   11   . lisinopril (PRINIVIL,ZESTRIL) 20 MG tablet   Oral   Take 1 tablet by mouth daily.         . sitaGLIPtin (JANUVIA) 100 MG tablet   Oral   Take 1 tablet (100 mg total) by mouth daily.   30 tablet   0     Allergies Review of patient's allergies indicates no known allergies.  Family History  Problem Relation Age of Onset  . CAD Sister   . Hypertension Sister  Social History History  Substance Use Topics  . Smoking status: Current Every Day Smoker -- 0.50 packs/day for 34 years    Types: Cigarettes  . Smokeless tobacco: Not on file  . Alcohol Use: 0.6 oz/week    1 Cans of beer per week    Review of Systems Constitutional: No fever/chills.  Generalized weakness and fatigue Eyes: Occasional blurred vision ENT: No sore throat. Cardiovascular: Denies chest pain. Respiratory: Occasional shortness of breath Gastrointestinal: No abdominal pain.  No nausea, no vomiting.  No diarrhea.  No constipation. Genitourinary: Negative for dysuria. Musculoskeletal: Negative for back pain. Skin: Negative for rash. Neurological: Negative for headaches, focal weakness or numbness.  10-point ROS otherwise  negative.  ____________________________________________   PHYSICAL EXAM:  VITAL SIGNS: ED Triage Vitals  Enc Vitals Group     BP 05/03/15 0042 119/83 mmHg     Pulse Rate 05/03/15 0042 91     Resp 05/03/15 0042 18     Temp 05/03/15 0042 97.7 F (36.5 C)     Temp Source 05/03/15 0042 Oral     SpO2 05/03/15 0042 96 %     Weight 05/03/15 0042 170 lb (77.111 kg)     Height 05/03/15 0042  (1.854 m)     Head Cir --      Peak Flow --      Pain Score 05/03/15 0204 10     Pain Loc --      Pain Edu? --      Excl. in GC? --     Constitutional: Alert and oriented. Well appearing and in no acute distress. Eyes: Conjunctivae are normal. PERRL. EOMI. Head: Atraumatic. Nose: No congestion/rhinnorhea. Mouth/Throat: Mucous membranes are moist.  Oropharynx non-erythematous. Neck: No stridor.   Cardiovascular: Normal rate, regular rhythm. Grossly normal heart sounds.  Good peripheral circulation. Respiratory: Normal respiratory effort.  No retractions. Lungs CTAB. Gastrointestinal: Soft and nontender. No distention. No abdominal bruits. No CVA tenderness. Musculoskeletal: No lower extremity tenderness nor edema.  No joint effusions. Neurologic:  Normal speech and language. No gross focal neurologic deficits are appreciated. Speech is normal. Skin:  Skin is warm, dry and intact. No rash noted. Psychiatric: Mood and affect are normal. Speech and behavior are normal.  ____________________________________________   LABS (all labs ordered are listed, but only abnormal results are displayed)  Labs Reviewed  GLUCOSE, CAPILLARY - Abnormal; Notable for the following:    Glucose-Capillary >600 (*)    All other components within normal limits  GLUCOSE, CAPILLARY - Abnormal; Notable for the following:    Glucose-Capillary 597 (*)    All other components within normal limits  COMPREHENSIVE METABOLIC PANEL - Abnormal; Notable for the following:    Sodium 133 (*)    CO2 20 (*)    Glucose,  Bld 683 (*)    ALT 16 (*)    All other components within normal limits  URINALYSIS COMPLETEWITH MICROSCOPIC ([facility] ONLY) - Abnormal; Notable for the following:    Color, Urine COLORLESS (*)    APPearance CLEAR (*)    Glucose, UA >500 (*)    All other components within normal limits  GLUCOSE, CAPILLARY - Abnormal; Notable for the following:    Glucose-Capillary >600 (*)    All other components within normal limits  GLUCOSE, CAPILLARY - Abnormal; Notable for the following:    Glucose-Capillary 260 (*)    All other components within normal limits  CBC  CBG MONITORING, ED   anion gap normal ____________________________________________  EKG  Not indicated ____________________________________________  RADIOLOGY Marylou Mccoy, personally viewed and evaluated these images as part of my medical decision making.   Dg Chest 1 View  04/03/2015   CLINICAL DATA:  Left-sided chest pain for 3 weeks  EXAM: CHEST  1 VIEW  COMPARISON:  07/03/2010  FINDINGS: The heart size and mediastinal contours are within normal limits. Both lungs are clear. The visualized skeletal structures are unremarkable. Postsurgical changes are noted in the cervical spine.  IMPRESSION: No active disease.   Electronically Signed   By: Alcide Clever M.D.   On: 04/03/2015 20:30    ____________________________________________   PROCEDURES  Procedure(s) performed: None  Critical Care performed: No ____________________________________________   INITIAL IMPRESSION / ASSESSMENT AND PLAN / ED COURSE  Pertinent labs & imaging results that were available during my care of the patient were reviewed by me and considered in my medical decision making (see chart for details).  I provided 2 L of normal saline and 10 units of regular insulin IV which lowered the patient's blood sugar down to the upper 200s.  He is asymptomatic at this time but is aggravated that he continues to have problems.  We had an extensive discussion  about diabetes management and the need for outpatient follow-up as well as possible referral to endocrinology.  I put in an order for referral to endocrinology as well as a consult to case management in case this helps with the process, but I stressed to him that his primary care doctor is the best one to refer him to endocrinology.  I gave him my usual and customary recommendations and return precautions.  ____________________________________________  FINAL CLINICAL IMPRESSION(S) / ED DIAGNOSES  Final diagnoses:  Hyperglycemia      NEW MEDICATIONS STARTED DURING THIS VISIT:  Discharge Medication List as of 05/03/2015  3:56 AM       Loleta Rose, MD 05/03/15 [id number]

## 2016-08-12 ENCOUNTER — Emergency Department
Admission: EM | Admit: 2016-08-12 | Discharge: 2016-08-12 | Disposition: A | Discharge status: Home / Self Care | Payer: Medicaid Other | Attending: Emergency Medicine | Admitting: Emergency Medicine

## 2016-08-12 ENCOUNTER — Emergency Department: Payer: Medicaid Other

## 2016-08-12 DIAGNOSIS — S63502A Unspecified sprain of left wrist, initial encounter: Secondary | ICD-10-CM | POA: Diagnosis not present

## 2016-08-12 DIAGNOSIS — W11XXXA Fall on and from ladder, initial encounter: Secondary | ICD-10-CM | POA: Insufficient documentation

## 2016-08-12 DIAGNOSIS — E119 Type 2 diabetes mellitus without complications: Secondary | ICD-10-CM | POA: Insufficient documentation

## 2016-08-12 DIAGNOSIS — Y929 Unspecified place or not applicable: Secondary | ICD-10-CM | POA: Insufficient documentation

## 2016-08-12 DIAGNOSIS — Z7982 Long term (current) use of aspirin: Secondary | ICD-10-CM | POA: Insufficient documentation

## 2016-08-12 DIAGNOSIS — Y999 Unspecified external cause status: Secondary | ICD-10-CM | POA: Insufficient documentation

## 2016-08-12 DIAGNOSIS — Z794 Long term (current) use of insulin: Secondary | ICD-10-CM | POA: Insufficient documentation

## 2016-08-12 DIAGNOSIS — I10 Essential (primary) hypertension: Secondary | ICD-10-CM | POA: Insufficient documentation

## 2016-08-12 DIAGNOSIS — S92045A Nondisplaced other fracture of tuberosity of left calcaneus, initial encounter for closed fracture: Secondary | ICD-10-CM | POA: Insufficient documentation

## 2016-08-12 DIAGNOSIS — F17210 Nicotine dependence, cigarettes, uncomplicated: Secondary | ICD-10-CM | POA: Insufficient documentation

## 2016-08-12 DIAGNOSIS — Y939 Activity, unspecified: Secondary | ICD-10-CM | POA: Insufficient documentation

## 2016-08-12 DIAGNOSIS — S6992XA Unspecified injury of left wrist, hand and finger(s), initial encounter: Secondary | ICD-10-CM | POA: Diagnosis present

## 2016-08-12 MED ORDER — MORPHINE SULFATE (PF) 4 MG/ML IV SOLN
4.0000 mg | Freq: Once | INTRAVENOUS | Status: AC
  Administered 2016-08-12: 4 mg via INTRAVENOUS
  Filled 2016-08-12: qty 1

## 2016-08-12 MED ORDER — OXYCODONE-ACETAMINOPHEN 5-325 MG PO TABS
ORAL_TABLET | ORAL | Status: AC
  Administered 2016-08-12: 1 via ORAL
  Filled 2016-08-12: qty 1

## 2016-08-12 MED ORDER — OXYCODONE-ACETAMINOPHEN 5-325 MG PO TABS: 1.0000 | ORAL_TABLET | Freq: Four times a day (QID) | ORAL | 0 refills | Status: DC | PRN

## 2016-08-12 MED ORDER — OXYCODONE-ACETAMINOPHEN 5-325 MG PO TABS
1.0000 | ORAL_TABLET | Freq: Once | ORAL | Status: AC
  Administered 2016-08-12: 1 via ORAL

## 2016-08-12 MED ORDER — ONDANSETRON HCL 4 MG/2ML IJ SOLN
4.0000 mg | Freq: Once | INTRAMUSCULAR | Status: AC
  Administered 2016-08-12: 4 mg via INTRAVENOUS
  Filled 2016-08-12: qty 2

## 2016-08-12 NOTE — ED Provider Notes (Addendum)
[facility] Emergency Department Provider Note  ____________________________________________  Time seen: Approximately 10:18 PM  I have reviewed the triage vital signs and the nursing notes.   HISTORY  Chief Complaint Fall    HPI Spencer [name] is a 55 y.o. male reports losing his balance while on a ladder and fell 6 feet down to the ground. He states that he landed upright on his feet, but hit the ground very hard with his left heel causing the ankle to roll outward. He then fell onto the left wrist. He complains of pain in the left wrist and left ankle. No other injuries, no head injury or neck pain. Consciousness. No numbness tingling or weakness.     Past Medical History:  Diagnosis Date  . Brain aneurysm   . Diabetes mellitus without complication (HCC)   . Hypertension      Patient Active Problem List   Diagnosis Date Noted  . Diabetic ketoacidosis (HCC) 04/03/2015  . DKA (diabetic ketoacidoses) (HCC) 04/03/2015  . Hypertension 04/03/2015  . Depression 04/03/2015  . Opiate dependence (HCC) 04/03/2015  . Tobacco abuse 04/03/2015     Past Surgical History:  Procedure Laterality Date  . HERNIA REPAIR    . NECK SURGERY       Prior to Admission medications   Medication Sig Start Date End Date Taking? Authorizing Provider  aspirin EC 81 MG EC tablet Take 1 tablet (81 mg total) by mouth daily. 04/06/15   Marinda Elk, MD  hydrochlorothiazide (MICROZIDE) 12.5 MG capsule Take 1 capsule by mouth daily. 04/23/14   Historical Provider, MD  ibuprofen (ADVIL,MOTRIN) 600 MG tablet Take 1 tablet (600 mg total) by mouth every 8 (eight) hours as needed. Patient taking differently: Take 600 mg by mouth every 8 (eight) hours as needed for mild pain.  03/25/15   Phineas Semen, MD  insulin aspart (NOVOLOG) 100 UNIT/ML FlexPen Inject 10 Units into the skin 3 (three) times daily with meals. Patient taking differently: Inject 20 Units into the skin 3  (three) times daily with meals.  04/06/15   Marinda Elk, MD  insulin glargine (LANTUS) 100 UNIT/ML injection Inject 0.65 mLs (65 Units total) into the skin 2 (two) times daily. Patient taking differently: Inject 60 Units into the skin 2 (two) times daily.  04/06/15   Marinda Elk, MD  lisinopril (PRINIVIL,ZESTRIL) 20 MG tablet Take 1 tablet by mouth daily. 03/14/14   Historical Provider, MD  oxyCODONE-acetaminophen (ROXICET) 5-325 MG tablet Take 1 tablet by mouth every 6 (six) hours as needed for severe pain. 08/12/16   Sharman Cheek, MD  sitaGLIPtin (JANUVIA) 100 MG tablet Take 1 tablet (100 mg total) by mouth daily. 03/26/15   Irean Hong, MD     Allergies Review of patient's allergies indicates no known allergies.   Family History  Problem Relation Age of Onset  . CAD Sister   . Hypertension Sister     Social History Social History  Substance Use Topics  . Smoking status: Current Every Day Smoker    Packs/day: 0.50    Years: 34.00    Types: Cigarettes  . Smokeless tobacco: Never Used  . Alcohol use 0.6 oz/week    1 Cans of beer per week    Review of Systems  Constitutional:   No fever or chills.  Cardiovascular:   No chest pain. Respiratory:   No dyspnea or cough. Gastrointestinal:   Negative for abdominal pain, vomiting and diarrhea.   Musculoskeletal:  Left ankle and left wrist pain. Neurological:   Negative for headaches 10-point ROS otherwise negative.  ____________________________________________   PHYSICAL EXAM:  VITAL SIGNS: ED Triage Vitals  Enc Vitals Group     BP 08/12/16 1846 (!) 191/119     Pulse Rate 08/12/16 1842 74     Resp 08/12/16 1842 16     Temp 08/12/16 1842 97.9 F (36.6 C)     Temp Source 08/12/16 1842 Oral     SpO2 08/12/16 1842 100 %     Weight 08/12/16 1843 178 lb (80.7 kg)     Height 08/12/16 1843 6\' 1"  (1.854 m)     Head Circumference --      Peak Flow --      Pain Score 08/12/16 1844 10     Pain Loc --       Pain Edu? --      Excl. in GC? --     Vital signs reviewed, nursing assessments reviewed.   Constitutional:   Alert and oriented. Well appearing and in no distress. Eyes:   No scleral icterus. No conjunctival pallor. PERRL. EOMI.  No nystagmus. ENT   Head:   Normocephalic and atraumatic.   Nose:   No congestion/rhinnorhea. No septal hematoma   Mouth/Throat:   MMM, no pharyngeal erythema. No peritonsillar mass.    Neck:   No stridor. No SubQ emphysema. No meningismus. Hematological/Lymphatic/Immunilogical:   No cervical lymphadenopathy. Cardiovascular:   RRR. Symmetric bilateral radial and DP pulses.  No murmurs.  Respiratory:   Normal respiratory effort without tachypnea nor retractions. Breath sounds are clear and equal bilaterally. No wheezes/rales/rhonchi. Gastrointestinal:   Soft and nontender. Non distended. There is no CVA tenderness.  No rebound, rigidity, or guarding. Genitourinary:   deferred Musculoskeletal:   Tenderness at the left medial malleolus. Swelling over the left lateral heel. Tenderness over the distal radius. No snuffbox tenderness. No pain with axial loading of the thumb.Marland Kitchen Neurologic:   Normal speech and language.  CN 2-10 normal. Motor grossly intact. No gross focal neurologic deficits are appreciated.  Skin:    Abrasions on left forearm ____________________________________________    LABS (pertinent positives/negatives) (all labs ordered are listed, but only abnormal results are displayed) Labs Reviewed - No data to display ____________________________________________   EKG    ____________________________________________    RADIOLOGY  X-ray left wrist unremarkable X-ray left ankle shows comminuted fracture of left calcaneus.  ____________________________________________   PROCEDURES Procedures SPLINT APPLICATION Date/Time: 10:23 PM Authorized by: Sharman Cheek Consent: Verbal consent obtained. Risks and benefits: risks,  benefits and alternatives were discussed Consent given by: patient Splint applied by: orthopedic technician Location details: Left ankle  Splint type: Posterior plus stirrup splint.  Supplies used: Ortho-Glass  Post-procedure: The splinted body part was neurovascularly unchanged following the procedure. Patient tolerance: Patient tolerated the procedure well with no immediate complications.   ____________________________________________   INITIAL IMPRESSION / ASSESSMENT AND PLAN / ED COURSE  Pertinent labs & imaging results that were available during my care of the patient were reviewed by me and considered in my medical decision making (see chart for details).  Patient presents with left ankle pain and swelling, found to have comminuted calcaneus fracture. Splinted. Crutches and crutch teaching. Provided pain medicine prescription. Follow up with orthopedics in 2 days. Patient is agreeable. Counseled on extreme caution with her cassette use not driving and being careful with crutches.  Tetanus up-to-date per patient, about a few months ago.   Clinical Course  ____________________________________________   FINAL CLINICAL IMPRESSION(S) / ED DIAGNOSES  Final diagnoses:  Left wrist sprain, initial encounter  Closed nondisplaced fracture of tuberosity of left calcaneus, unspecified fracture morphology, initial encounter       Portions of this note were generated with dragon dictation software. Dictation errors may occur despite best attempts at proofreading.    Sharman CheekPhillip Lunell Robart, MD 08/12/16 2224    Sharman CheekPhillip Jerricka Carvey, MD 08/12/16 2224

## 2016-08-12 NOTE — ED Triage Notes (Signed)
Pt presents via POV s/p fall from ladder apx 6-18ft in air. Left ankle swelling and edema noted. Also c/o left wrist pain.

## 2016-08-14 ENCOUNTER — Ambulatory Visit
Admission: RE | Admit: 2016-08-14 | Discharge: 2016-08-14 | Disposition: A | Discharge status: Home / Self Care | Payer: Medicaid Other | Source: Ambulatory Visit | Attending: Podiatry | Admitting: Podiatry

## 2016-08-14 ENCOUNTER — Other Ambulatory Visit: Payer: Self-pay | Admitting: Podiatry

## 2016-08-14 DIAGNOSIS — S92012S Displaced fracture of body of left calcaneus, sequela: Secondary | ICD-10-CM | POA: Diagnosis not present

## 2016-08-14 DIAGNOSIS — X58XXXS Exposure to other specified factors, sequela: Secondary | ICD-10-CM | POA: Diagnosis not present

## 2016-11-05 ENCOUNTER — Emergency Department
Admission: EM | Admit: 2016-11-05 | Discharge: 2016-11-06 | Payer: Medicaid Other | Attending: Emergency Medicine | Admitting: Emergency Medicine

## 2016-11-05 ENCOUNTER — Encounter: Payer: Self-pay | Admitting: Emergency Medicine

## 2016-11-05 ENCOUNTER — Emergency Department: Payer: Medicaid Other

## 2016-11-05 DIAGNOSIS — I10 Essential (primary) hypertension: Secondary | ICD-10-CM | POA: Insufficient documentation

## 2016-11-05 DIAGNOSIS — E119 Type 2 diabetes mellitus without complications: Secondary | ICD-10-CM | POA: Diagnosis not present

## 2016-11-05 DIAGNOSIS — E8729 Other acidosis: Secondary | ICD-10-CM

## 2016-11-05 DIAGNOSIS — Z794 Long term (current) use of insulin: Secondary | ICD-10-CM | POA: Insufficient documentation

## 2016-11-05 DIAGNOSIS — E1110 Type 2 diabetes mellitus with ketoacidosis without coma: Secondary | ICD-10-CM

## 2016-11-05 DIAGNOSIS — F17210 Nicotine dependence, cigarettes, uncomplicated: Secondary | ICD-10-CM | POA: Diagnosis not present

## 2016-11-05 DIAGNOSIS — E875 Hyperkalemia: Secondary | ICD-10-CM | POA: Diagnosis not present

## 2016-11-05 DIAGNOSIS — R1110 Vomiting, unspecified: Secondary | ICD-10-CM

## 2016-11-05 DIAGNOSIS — E872 Acidosis: Secondary | ICD-10-CM | POA: Insufficient documentation

## 2016-11-05 DIAGNOSIS — R112 Nausea with vomiting, unspecified: Secondary | ICD-10-CM | POA: Diagnosis present

## 2016-11-05 LAB — BASIC METABOLIC PANEL
BUN: 42 mg/dL — ABNORMAL HIGH (ref 6–20)
CALCIUM: 8.7 mg/dL — AB (ref 8.9–10.3)
CO2: <7 mmol/L — ABNORMAL LOW (ref 22–32)
CREATININE: 2.03 mg/dL — AB (ref 0.61–1.24)
Chloride: 101 mmol/L (ref 101–111)
GFR calc non Af Amer: 35 mL/min — ABNORMAL LOW (ref >60)
GFR, EST AFRICAN AMERICAN: 40 mL/min — AB (ref >60)
Glucose, Bld: 770 mg/dL (ref 65–99)
Potassium: 6.1 mmol/L — ABNORMAL HIGH (ref 3.5–5.1)
SODIUM: 131 mmol/L — AB (ref 135–145)

## 2016-11-05 LAB — CBC
HCT: 55.3 % — ABNORMAL HIGH (ref 40.0–52.0)
Hemoglobin: 17.3 g/dL (ref 13.0–18.0)
MCH: 32.1 pg (ref 26.0–34.0)
MCHC: 31.3 g/dL — ABNORMAL LOW (ref 32.0–36.0)
MCV: 102.4 fL — AB (ref 80.0–100.0)
PLATELETS: 223 10*3/uL (ref 150–440)
RBC: 5.4 MIL/uL (ref 4.40–5.90)
RDW: 14.4 % (ref 11.5–14.5)
WBC: 27.5 10*3/uL — AB (ref 3.8–10.6)

## 2016-11-05 LAB — BLOOD GAS, VENOUS
ACID-BASE DEFICIT: 27.1 mmol/L — AB (ref 0.0–2.0)
Acid-base deficit: 26.4 mmol/L — ABNORMAL HIGH (ref 0.0–2.0)
Bicarbonate: 4.4 mmol/L — ABNORMAL LOW (ref 20.0–28.0)
Bicarbonate: 4.6 mmol/L — ABNORMAL LOW (ref 20.0–28.0)
FIO2: 0.21
FIO2: 0.21
O2 SAT: 31.3 %
O2 SAT: 35.7 %
PATIENT TEMPERATURE: 37
PATIENT TEMPERATURE: 37
PCO2 VEN: 23 mmHg — AB (ref 44.0–60.0)
pCO2, Ven: 20 mmHg — ABNORMAL LOW (ref 44.0–60.0)
pH, Ven: 6.91 — CL (ref 7.250–7.430)
pH, Ven: 6.95 — CL (ref 7.250–7.430)
pO2, Ven: 36 mmHg (ref 32.0–45.0)
pO2, Ven: 37 mmHg (ref 32.0–45.0)

## 2016-11-05 LAB — COMPREHENSIVE METABOLIC PANEL
ALT: 19 U/L (ref 17–63)
AST: 20 U/L (ref 15–41)
Albumin: 4.9 g/dL (ref 3.5–5.0)
Alkaline Phosphatase: 145 U/L — ABNORMAL HIGH (ref 38–126)
BUN: 43 mg/dL — ABNORMAL HIGH (ref 6–20)
CHLORIDE: 96 mmol/L — AB (ref 101–111)
CO2: <7 mmol/L — ABNORMAL LOW (ref 22–32)
Calcium: 9.6 mg/dL (ref 8.9–10.3)
Creatinine, Ser: 2.35 mg/dL — ABNORMAL HIGH (ref 0.61–1.24)
GFR, EST AFRICAN AMERICAN: 34 mL/min — AB (ref >60)
GFR, EST NON AFRICAN AMERICAN: 29 mL/min — AB (ref >60)
Glucose, Bld: 870 mg/dL (ref 65–99)
POTASSIUM: 5.4 mmol/L — AB (ref 3.5–5.1)
Sodium: 129 mmol/L — ABNORMAL LOW (ref 135–145)
Total Bilirubin: 2.4 mg/dL — ABNORMAL HIGH (ref 0.3–1.2)
Total Protein: 8.2 g/dL — ABNORMAL HIGH (ref 6.5–8.1)

## 2016-11-05 LAB — TROPONIN I

## 2016-11-05 LAB — GLUCOSE, CAPILLARY

## 2016-11-05 LAB — CK: Total CK: 53 U/L (ref 49–397)

## 2016-11-05 LAB — LACTIC ACID, PLASMA
LACTIC ACID, VENOUS: 1.4 mmol/L (ref 0.5–1.9)
LACTIC ACID, VENOUS: 2.1 mmol/L — AB (ref 0.5–1.9)

## 2016-11-05 LAB — SALICYLATE LEVEL

## 2016-11-05 MED ORDER — STERILE WATER FOR INJECTION IV SOLN
Freq: Once | INTRAVENOUS | Status: AC
  Administered 2016-11-05: 23:00:00 via INTRAVENOUS
  Filled 2016-11-05: qty 850

## 2016-11-05 MED ORDER — SODIUM CHLORIDE 0.9 % IV SOLN
INTRAVENOUS | Status: DC
  Filled 2016-11-05: qty 2.5

## 2016-11-05 MED ORDER — MORPHINE SULFATE (PF) 4 MG/ML IV SOLN
4.0000 mg | Freq: Once | INTRAVENOUS | Status: AC
  Administered 2016-11-05: 4 mg via INTRAVENOUS
  Filled 2016-11-05: qty 1

## 2016-11-05 MED ORDER — SODIUM CHLORIDE 0.9 % IV SOLN
1.0000 g | Freq: Once | INTRAVENOUS | Status: DC
  Filled 2016-11-05: qty 10

## 2016-11-05 MED ORDER — SODIUM CHLORIDE 0.9 % IV BOLUS (SEPSIS)
2000.0000 mL | Freq: Once | INTRAVENOUS | Status: AC
  Administered 2016-11-05: 2000 mL via INTRAVENOUS

## 2016-11-05 MED ORDER — SODIUM CHLORIDE 0.9 % IV BOLUS (SEPSIS): 1000.0000 mL | Freq: Once | INTRAVENOUS | Status: DC

## 2016-11-05 MED ORDER — ONDANSETRON HCL 4 MG/2ML IJ SOLN
4.0000 mg | Freq: Once | INTRAMUSCULAR | Status: AC
  Administered 2016-11-05: 4 mg via INTRAVENOUS
  Filled 2016-11-05: qty 2

## 2016-11-05 NOTE — ED Notes (Signed)
Dr Zenda AlpersWebster notified of glucose of 770

## 2016-11-05 NOTE — ED Notes (Signed)
Dr Fanny BienQuale notified of high glucose, PH 6.95 and bicarb 4.4

## 2016-11-05 NOTE — ED Provider Notes (Signed)
[facility] Emergency Department Provider Note   ____________________________________________   First MD Initiated Contact with Patient 11/05/16 2155     (approximate)  I have reviewed the triage vital signs and the nursing notes.   HISTORY  Chief Complaint Abdominal Pain and Hyperglycemia    HPI Spencer [name] is a 55 y.o. male reports that for the last 4 days he started feeling nauseated, thirsty, frequently urinating and then developed upper abdominal pain as well as nausea and vomiting for the last 3-4 days with inability to keep anything down. He reports his blood sugars are "high" and this feels like it did about a year ago when he had "ketoacidosis"  No fevers or chills. No chest pain or shortness of breath. Patient reports he feels very fatigued, lightheaded with any attempt to stand or walk. No confusion no headache.  Patient reports he is compliant with his insulin therapy.   Past Medical History:  Diagnosis Date  . Brain aneurysm   . Diabetes mellitus without complication (HCC)   . Hypertension     Patient Active Problem List   Diagnosis Date Noted  . Diabetic ketoacidosis (HCC) 04/03/2015  . DKA (diabetic ketoacidoses) (HCC) 04/03/2015  . Hypertension 04/03/2015  . Depression 04/03/2015  . Opiate dependence (HCC) 04/03/2015  . Tobacco abuse 04/03/2015    Past Surgical History:  Procedure Laterality Date  . HERNIA REPAIR    . NECK SURGERY      Prior to Admission medications   Medication Sig Start Date End Date Taking? Authorizing Provider  aspirin EC 81 MG EC tablet Take 1 tablet (81 mg total) by mouth daily. 04/06/15   Marinda Elk, MD  hydrochlorothiazide (MICROZIDE) 12.5 MG capsule Take 1 capsule by mouth daily. 04/23/14   Historical Provider, MD  ibuprofen (ADVIL,MOTRIN) 600 MG tablet Take 1 tablet (600 mg total) by mouth every 8 (eight) hours as needed. Patient taking differently: Take 600 mg by mouth every 8 (eight)  hours as needed for mild pain.  03/25/15   Phineas Semen, MD  insulin aspart (NOVOLOG) 100 UNIT/ML FlexPen Inject 10 Units into the skin 3 (three) times daily with meals. Patient taking differently: Inject 20 Units into the skin 3 (three) times daily with meals.  04/06/15   Marinda Elk, MD  insulin glargine (LANTUS) 100 UNIT/ML injection Inject 0.65 mLs (65 Units total) into the skin 2 (two) times daily. Patient taking differently: Inject 60 Units into the skin 2 (two) times daily.  04/06/15   Marinda Elk, MD  lisinopril (PRINIVIL,ZESTRIL) 20 MG tablet Take 1 tablet by mouth daily. 03/14/14   Historical Provider, MD  oxyCODONE-acetaminophen (ROXICET) 5-325 MG tablet Take 1 tablet by mouth every 6 (six) hours as needed for severe pain. 08/12/16   Sharman Cheek, MD  sitaGLIPtin (JANUVIA) 100 MG tablet Take 1 tablet (100 mg total) by mouth daily. 03/26/15   Irean Hong, MD    Allergies Patient has no known allergies.  Family History  Problem Relation Age of Onset  . CAD Sister   . Hypertension Sister     Social History Social History  Substance Use Topics  . Smoking status: Current Every Day Smoker    Packs/day: 0.50    Years: 34.00    Types: Cigarettes  . Smokeless tobacco: Never Used  . Alcohol use 0.6 oz/week    1 Cans of beer per week    Review of Systems Constitutional: No fever/chills Eyes: No visual changes. ENT:  No sore throat.Very dry mouth Cardiovascular: Denies chest pain. Respiratory: Denies shortness of breath. Gastrointestinal:  No diarrhea.  No constipation. Genitourinary: Negative for dysuria. Dark urination, frequent urination Musculoskeletal: Negative for back pain. Skin: Negative for rash. Neurological: Negative for headaches, focal weakness or numbness.  10-point ROS otherwise negative.  ____________________________________________   PHYSICAL EXAM:  VITAL SIGNS: ED Triage Vitals  Enc Vitals Group     BP 11/05/16 2134 (!) 176/103      Pulse Rate 11/05/16 2134 100     Resp 11/05/16 2134 (!) 22     Temp 11/05/16 2137 97.5 F (36.4 C)     Temp Source 11/05/16 2134 Oral     SpO2 11/05/16 2134 100 %     Weight 11/05/16 2135 170 lb (77.1 kg)     Height 11/05/16 2135 6\' 1"  (1.854 m)     Head Circumference --      Peak Flow --      Pain Score 11/05/16 2137 8     Pain Loc --      Pain Edu? --      Excl. in GC? --     Constitutional: Alert and oriented. Somewhat cachectic, generally ill in appearance but in no distress. Increased respiratory rate noted. Patient appears fatigued, no confusion somnolence or lethargy. Eyes: Conjunctivae are normal. PERRL. EOMI. Head: Atraumatic. Nose: No congestion/rhinnorhea. Mouth/Throat: Mucous membranes are extremely dry.  Oropharynx non-erythematous. Neck: No stridor.   Cardiovascular: Tachycardic rate, regular rhythm. Grossly normal heart sounds.  Good peripheral circulation. Respiratory: Moderate increased work of breathing with frequent deep respirations, clear lung sounds. Patient able to speak in full sentences, kausmal like breathing noted. Gastrointestinal: Soft and nontender except across the epigastrium and the patient reports it feels "sore". He has no rebound guarding or distention. No focal tenderness. No right lower quadrant tenderness. Negative Murphy Musculoskeletal: No lower extremity tenderness nor edema.   Neurologic:  Normal speech and language. No gross focal neurologic deficits are appreciated. Skin:  Skin is warm, dry and intact. No rash noted. Psychiatric: Mood and affect are normal. Speech and behavior are normal.  ____________________________________________   LABS (all labs ordered are listed, but only abnormal results are displayed)  Labs Reviewed  LACTIC ACID, PLASMA - Abnormal; Notable for the following:       Result Value   Lactic Acid, Venous 2.1 (*)    All other components within normal limits  BLOOD GAS, VENOUS - Abnormal; Notable for the following:     pH, Ven 6.91 (*)    pCO2, Ven 23 (*)    Bicarbonate 4.6 (*)    Acid-base deficit 27.1 (*)    All other components within normal limits  CBC - Abnormal; Notable for the following:    WBC 27.5 (*)    HCT 55.3 (*)    MCV 102.4 (*)    MCHC 31.3 (*)    All other components within normal limits  COMPREHENSIVE METABOLIC PANEL - Abnormal; Notable for the following:    Sodium 129 (*)    Potassium 5.4 (*)    Chloride 96 (*)    CO2 <7 (*)    Glucose, Bld 870 (*)    BUN 43 (*)    Creatinine, Ser 2.35 (*)    Total Protein 8.2 (*)    Alkaline Phosphatase 145 (*)    Total Bilirubin 2.4 (*)    GFR calc non Af Amer 29 (*)    GFR calc Af Amer 34 (*)  All other components within normal limits  GLUCOSE, CAPILLARY - Abnormal; Notable for the following:    Glucose-Capillary >600 (*)    All other components within normal limits  BLOOD GAS, VENOUS - Abnormal; Notable for the following:    pH, Ven 6.95 (*)    pCO2, Ven 20 (*)    Bicarbonate 4.4 (*)    Acid-base deficit 26.4 (*)    All other components within normal limits  BASIC METABOLIC PANEL - Abnormal; Notable for the following:    Sodium 131 (*)    Potassium 6.1 (*)    CO2 <7 (*)    Glucose, Bld 770 (*)    BUN 42 (*)    Creatinine, Ser 2.03 (*)    Calcium 8.7 (*)    GFR calc non Af Amer 35 (*)    GFR calc Af Amer 40 (*)    All other components within normal limits  GLUCOSE, CAPILLARY - Abnormal; Notable for the following:    Glucose-Capillary >600 (*)    All other components within normal limits  LACTIC ACID, PLASMA  TROPONIN I  CK  SALICYLATE LEVEL  POTASSIUM  BASIC METABOLIC PANEL  CBG MONITORING, ED   ____________________________________________  EKG  Reviewed and interpreted by me and 2140 Heart rate 100 QRS 90 QTc 420 Sinus tachycardia, no acute abnormalities of ischemia noted no hyperacute appearance of the T waves in the anteroseptal distribution is noted. Likely metabolic or potentially electrolyte based, labs  are pending. Patient denying any chest pain. ____________________________________________  RADIOLOGY  Dg Abd 1 View  Result Date: 11/05/2016 CLINICAL DATA:  Abdominal pain with vomiting EXAM: ABDOMEN - 1 VIEW COMPARISON:  07/08/2009 FINDINGS: Visualized lung bases are clear. Metallic BB right upper quadrant. Moderate gastric dilatation. Nonobstructed bowel gas pattern with moderate stool in the colon. Probable calcified pelvic phleboliths. Stable sclerotic lesion right femoral neck. IMPRESSION: Nonobstructed bowel-gas pattern. Moderate air distention of the stomach. Electronically Signed   By: Jasmine PangKim  Fujinaga M.D.   On: 11/05/2016 22:41    ____________________________________________   PROCEDURES  Procedure(s) performed: None  Procedures  Critical Care performed: Yes, see critical care note(s)  CRITICAL CARE Performed by: Sharyn CreamerQUALE, Curvin Hunger   Total critical care time: 65 minutes  Critical care time was exclusive of separately billable procedures and treating other patients.  Critical care was necessary to treat or prevent imminent or life-threatening deterioration.  Critical care was time spent personally by me on the following activities: development of treatment plan with patient and/or surrogate as well as nursing, discussions with consultants, evaluation of patient's response to treatment, examination of patient, obtaining history from patient or surrogate, ordering and performing treatments and interventions, ordering and review of laboratory studies, ordering and review of radiographic studies, pulse oximetry and re-evaluation of patient's condition.  ____________________________________________   INITIAL IMPRESSION / ASSESSMENT AND PLAN / ED COURSE  Pertinent labs & imaging results that were available during my care of the patient were reviewed by me and considered in my medical decision making (see chart for details).  Patient presents with 4 days of nausea, vomiting, and  "high" blood sugars. Severe thirst, severe urination with frequent urination. Also reports abdominal pain worsening over the last couple of days associated with nausea and vomiting. He reports being compliant with his insulin therapy, and previous history he reports being very brittle diabetic.  No acute cardiac or pulmonary symptoms as EKG shows suspiciously peaked T waves in the anteroseptal distribution but no history of renal failure, or end  stage renal disease per the patient. We'll await potassium, initiate IV fluid with aggressive hydration for severe acidosis. Lactic acid ordered and pending  Clinical Course as of Nov 06 26  Wynelle Link Nov 05, 2016  2252 Labs reviewed. No significant acute kidney injury, evidence of acute diabetic ketoacidosis with severe acidosis, also note calcium elevated this should improve with hydration as well as bicarbonate infusion. Continue to monitor closely, patient accepted the ICU at [facility]. Anticipate starting insulin infusion after second liter of fluid completes.  [MQ]    Clinical Course User Index [MQ] Sharyn Creamer, MD   ----------------------------------------- 10:03 PM on 11/05/2016 -----------------------------------------  Patient labs return with extreme critical pH with acid-base deficit of 27. Patient presentation and pH is 6.9 highly suggestive of acute diabetic ketoacidosis with glucose greater than 600. Currently there are no expected openings for ICU beds at [facility], based upon this have requested a bed for the patient at [facility] ICU in transfer for severe DKA.  Patient denies any fever or acute infectious symptoms, suspect patient likely suffering from severe DKA and his nausea and vomiting likely responses as well as abdominal discomfort. He does not have significant pain to palpation on examination or focality, no PE warrants close monitoring and do not believe CT abdomen and pelvis is warranted at this time.  Discussed with the patient,  he understands reason for transfer and that there are no ICU beds available. He is agreeable with transfer to outside facility including [place], [place] or other surrounding hospital where ICU service is available.  ----------------------------------------- 10:24 PM on 11/05/2016 -----------------------------------------  An due to the anticipated need for medical ICU admission, discussed case with Dr. Venita Sheffield of the medical ICU at [facility] Patient accepted in transfer to [facility] intensive care unit. Dr. Levada Schilling recommends initiating the patient on bicarbonate infusion at about 174ml/hr due to severity of acidosis at this time.  ----------------------------------------- 12:23 AM on 11/06/2016 -----------------------------------------  Patient potassium noted to have elevated. I discussed with ICU intensivist at [facility], and patient's insulin infusion has now arrived and will be starting, additional fluids ordered, calcium gluconate ordered, also Kayexalate at the request of ICU physician, as well as 10 units of subcutaneous insulin.  Patient has a ready bed at [facility] available at this time, the patient is able to sit up, has urinated about 1000 cc of clear yellow urine. He is awake and alert, with improvement in his hemodynamics, discussed with nurse Marcelino Duster will be treating him aggressively for his elevated potassium at this time as well as for DKA and closing his.  ----------------------------------------- 12:27 AM on 11/06/2016 -----------------------------------------  Patient alert, understanding plan for transfer. Hemodynamics improving. Insulin infusion initiated, subcutaneous insulin given as well. Bicarbonate infusion currently running in additional fluids going. Patient being transferred to critical care team to [facility] for ready and waiting medical ICU bed  The patient appears stable for transfer. Condition  improved.  ----------------------------------------- 12:28 AM on 11/06/2016 -----------------------------------------  Ongoing care assigned to Dr. Zenda Alpers. Patient awaiting transfer, critical care team about 15 minutes away. Continue to monitor patient carefully, continuing treatment for DKA as well as hyperkalemia. ____________________________________________   FINAL CLINICAL IMPRESSION(S) / ED DIAGNOSES  Final diagnoses:  Vomiting  Diabetic ketoacidosis without coma associated with type 2 diabetes mellitus (HCC)  Increased anion gap metabolic acidosis  Acute hyperkalemia      NEW MEDICATIONS STARTED DURING THIS VISIT:  New Prescriptions   No medications on file     Note:  This document was prepared using Dragon voice recognition software and may include unintentional dictation errors.     Sharyn Creamer, MD 11/06/16 Jacinta Shoe

## 2016-11-05 NOTE — ED Triage Notes (Signed)
EMS and patient reports blood sugar at home read high, took insulin and rechecked at 412.  Patient reports abdominal pain with belching and some vomiting over the past 4-5 days.  Reports decreased po intake because of same.

## 2016-11-06 ENCOUNTER — Inpatient Hospital Stay (HOSPITAL_COMMUNITY)
Admission: AD | Admit: 2016-11-06 | Discharge: 2016-11-09 | DRG: 637 | Disposition: A | Discharge status: Home / Self Care | Payer: Medicaid Other | Source: Other Acute Inpatient Hospital | Attending: Internal Medicine | Admitting: Internal Medicine

## 2016-11-06 ENCOUNTER — Encounter (HOSPITAL_COMMUNITY): Payer: Self-pay | Admitting: *Deleted

## 2016-11-06 ENCOUNTER — Inpatient Hospital Stay (HOSPITAL_COMMUNITY): Payer: Medicaid Other

## 2016-11-06 DIAGNOSIS — F14929 Cocaine use, unspecified with intoxication, unspecified: Secondary | ICD-10-CM | POA: Diagnosis present

## 2016-11-06 DIAGNOSIS — F17210 Nicotine dependence, cigarettes, uncomplicated: Secondary | ICD-10-CM | POA: Diagnosis present

## 2016-11-06 DIAGNOSIS — G9341 Metabolic encephalopathy: Secondary | ICD-10-CM | POA: Diagnosis present

## 2016-11-06 DIAGNOSIS — E874 Mixed disorder of acid-base balance: Secondary | ICD-10-CM | POA: Diagnosis present

## 2016-11-06 DIAGNOSIS — E875 Hyperkalemia: Secondary | ICD-10-CM | POA: Diagnosis present

## 2016-11-06 DIAGNOSIS — E43 Unspecified severe protein-calorie malnutrition: Secondary | ICD-10-CM | POA: Diagnosis not present

## 2016-11-06 DIAGNOSIS — I10 Essential (primary) hypertension: Secondary | ICD-10-CM | POA: Diagnosis present

## 2016-11-06 DIAGNOSIS — D72829 Elevated white blood cell count, unspecified: Secondary | ICD-10-CM | POA: Diagnosis not present

## 2016-11-06 DIAGNOSIS — E872 Acidosis: Secondary | ICD-10-CM | POA: Diagnosis not present

## 2016-11-06 DIAGNOSIS — Z794 Long term (current) use of insulin: Secondary | ICD-10-CM | POA: Diagnosis not present

## 2016-11-06 DIAGNOSIS — Z9111 Patient's noncompliance with dietary regimen: Secondary | ICD-10-CM

## 2016-11-06 DIAGNOSIS — Z23 Encounter for immunization: Secondary | ICD-10-CM

## 2016-11-06 DIAGNOSIS — I671 Cerebral aneurysm, nonruptured: Secondary | ICD-10-CM | POA: Diagnosis not present

## 2016-11-06 DIAGNOSIS — Z6821 Body mass index (BMI) 21.0-21.9, adult: Secondary | ICD-10-CM

## 2016-11-06 DIAGNOSIS — E1310 Other specified diabetes mellitus with ketoacidosis without coma: Secondary | ICD-10-CM | POA: Diagnosis not present

## 2016-11-06 DIAGNOSIS — E876 Hypokalemia: Secondary | ICD-10-CM | POA: Diagnosis present

## 2016-11-06 DIAGNOSIS — E0810 Diabetes mellitus due to underlying condition with ketoacidosis without coma: Secondary | ICD-10-CM

## 2016-11-06 DIAGNOSIS — E1110 Type 2 diabetes mellitus with ketoacidosis without coma: Secondary | ICD-10-CM | POA: Diagnosis not present

## 2016-11-06 DIAGNOSIS — Z7982 Long term (current) use of aspirin: Secondary | ICD-10-CM | POA: Diagnosis not present

## 2016-11-06 DIAGNOSIS — R0600 Dyspnea, unspecified: Secondary | ICD-10-CM

## 2016-11-06 LAB — BASIC METABOLIC PANEL
ANION GAP: 9 (ref 5–15)
ANION GAP: 7 (ref 5–15)
ANION GAP: 7 (ref 5–15)
Anion gap: 18 — ABNORMAL HIGH (ref 5–15)
BUN: 35 mg/dL — AB (ref 6–20)
BUN: 40 mg/dL — ABNORMAL HIGH (ref 6–20)
BUN: 26 mg/dL — AB (ref 6–20)
BUN: 28 mg/dL — AB (ref 6–20)
BUN: 28 mg/dL — ABNORMAL HIGH (ref 6–20)
CALCIUM: 9 mg/dL (ref 8.9–10.3)
CALCIUM: 9 mg/dL (ref 8.9–10.3)
CALCIUM: 8.8 mg/dL — AB (ref 8.9–10.3)
CHLORIDE: 108 mmol/L (ref 101–111)
CO2: 11 mmol/L — AB (ref 22–32)
CO2: 16 mmol/L — AB (ref 22–32)
CO2: 18 mmol/L — AB (ref 22–32)
CO2: 18 mmol/L — AB (ref 22–32)
CREATININE: 2.12 mg/dL — AB (ref 0.61–1.24)
CREATININE: 1.39 mg/dL — AB (ref 0.61–1.24)
Calcium: 8.2 mg/dL — ABNORMAL LOW (ref 8.9–10.3)
Calcium: 9.5 mg/dL (ref 8.9–10.3)
Chloride: 104 mmol/L (ref 101–111)
Chloride: 113 mmol/L — ABNORMAL HIGH (ref 101–111)
Chloride: 116 mmol/L — ABNORMAL HIGH (ref 101–111)
Chloride: 116 mmol/L — ABNORMAL HIGH (ref 101–111)
Creatinine, Ser: 1.92 mg/dL — ABNORMAL HIGH (ref 0.61–1.24)
Creatinine, Ser: 1.62 mg/dL — ABNORMAL HIGH (ref 0.61–1.24)
Creatinine, Ser: 1.25 mg/dL — ABNORMAL HIGH (ref 0.61–1.24)
GFR calc Af Amer: 38 mL/min — ABNORMAL LOW (ref >60)
GFR calc Af Amer: 53 mL/min — ABNORMAL LOW (ref >60)
GFR calc Af Amer: >60 mL/min (ref >60)
GFR calc Af Amer: >60 mL/min (ref >60)
GFR calc non Af Amer: 37 mL/min — ABNORMAL LOW (ref >60)
GFR calc non Af Amer: 33 mL/min — ABNORMAL LOW (ref >60)
GFR, EST AFRICAN AMERICAN: 43 mL/min — AB (ref >60)
GFR, EST NON AFRICAN AMERICAN: 46 mL/min — AB (ref >60)
GFR, EST NON AFRICAN AMERICAN: 55 mL/min — AB (ref >60)
GLUCOSE: 120 mg/dL — AB (ref 65–99)
GLUCOSE: 212 mg/dL — AB (ref 65–99)
GLUCOSE: 728 mg/dL — AB (ref 65–99)
GLUCOSE: 99 mg/dL (ref 65–99)
Glucose, Bld: 393 mg/dL — ABNORMAL HIGH (ref 65–99)
POTASSIUM: 5.8 mmol/L — AB (ref 3.5–5.1)
Potassium: 4.4 mmol/L (ref 3.5–5.1)
Potassium: 3.6 mmol/L (ref 3.5–5.1)
Potassium: 3.1 mmol/L — ABNORMAL LOW (ref 3.5–5.1)
Potassium: 3.6 mmol/L (ref 3.5–5.1)
SODIUM: 137 mmol/L (ref 135–145)
Sodium: 131 mmol/L — ABNORMAL LOW (ref 135–145)
Sodium: 138 mmol/L (ref 135–145)
Sodium: 141 mmol/L (ref 135–145)
Sodium: 141 mmol/L (ref 135–145)

## 2016-11-06 LAB — GLUCOSE, CAPILLARY
GLUCOSE-CAPILLARY: 138 mg/dL — AB (ref 65–99)
GLUCOSE-CAPILLARY: 183 mg/dL — AB (ref 65–99)
GLUCOSE-CAPILLARY: 186 mg/dL — AB (ref 65–99)
GLUCOSE-CAPILLARY: 345 mg/dL — AB (ref 65–99)
GLUCOSE-CAPILLARY: 453 mg/dL — AB (ref 65–99)
GLUCOSE-CAPILLARY: 81 mg/dL (ref 65–99)
Glucose-Capillary: >600 mg/dL (ref 65–99)
Glucose-Capillary: 249 mg/dL — ABNORMAL HIGH (ref 65–99)
Glucose-Capillary: 245 mg/dL — ABNORMAL HIGH (ref 65–99)
Glucose-Capillary: 197 mg/dL — ABNORMAL HIGH (ref 65–99)
Glucose-Capillary: 218 mg/dL — ABNORMAL HIGH (ref 65–99)
Glucose-Capillary: 192 mg/dL — ABNORMAL HIGH (ref 65–99)
Glucose-Capillary: 89 mg/dL (ref 65–99)
Glucose-Capillary: 74 mg/dL (ref 65–99)
Glucose-Capillary: 83 mg/dL (ref 65–99)
Glucose-Capillary: 115 mg/dL — ABNORMAL HIGH (ref 65–99)

## 2016-11-06 LAB — URINALYSIS, ROUTINE W REFLEX MICROSCOPIC
BILIRUBIN URINE: NEGATIVE
Bacteria, UA: NONE SEEN
HGB URINE DIPSTICK: NEGATIVE
Ketones, ur: 20 mg/dL — AB
LEUKOCYTES UA: NEGATIVE
Nitrite: NEGATIVE
Protein, ur: 30 mg/dL — AB
SPECIFIC GRAVITY, URINE: 1.021 (ref 1.005–1.030)
SQUAMOUS EPITHELIAL / LPF: NONE SEEN
pH: 5 (ref 5.0–8.0)

## 2016-11-06 LAB — RAPID URINE DRUG SCREEN, HOSP PERFORMED
AMPHETAMINES: NOT DETECTED
Barbiturates: NOT DETECTED
Benzodiazepines: NOT DETECTED
Cocaine: POSITIVE — AB
Opiates: POSITIVE — AB
Tetrahydrocannabinol: NOT DETECTED

## 2016-11-06 LAB — AMYLASE: Amylase: 66 U/L (ref 28–100)

## 2016-11-06 LAB — CBC
HCT: 45.7 % (ref 39.0–52.0)
HCT: 40.5 % (ref 39.0–52.0)
HEMOGLOBIN: 14.3 g/dL (ref 13.0–17.0)
Hemoglobin: 15.9 g/dL (ref 13.0–17.0)
MCH: 32.1 pg (ref 26.0–34.0)
MCH: 32.1 pg (ref 26.0–34.0)
MCHC: 34.8 g/dL (ref 30.0–36.0)
MCHC: 35.3 g/dL (ref 30.0–36.0)
MCV: 92.3 fL (ref 78.0–100.0)
MCV: 90.8 fL (ref 78.0–100.0)
PLATELETS: 159 10*3/uL (ref 150–400)
Platelets: 152 10*3/uL (ref 150–400)
RBC: 4.95 MIL/uL (ref 4.22–5.81)
RBC: 4.46 MIL/uL (ref 4.22–5.81)
RDW: 12.7 % (ref 11.5–15.5)
RDW: 12.7 % (ref 11.5–15.5)
WBC: 19 10*3/uL — AB (ref 4.0–10.5)
WBC: 15.8 10*3/uL — ABNORMAL HIGH (ref 4.0–10.5)

## 2016-11-06 LAB — ETHANOL: Alcohol, Ethyl (B): <5 mg/dL (ref <5)

## 2016-11-06 LAB — LIPASE, BLOOD: Lipase: 57 U/L — ABNORMAL HIGH (ref 11–51)

## 2016-11-06 LAB — POTASSIUM: Potassium: 5.8 mmol/L — ABNORMAL HIGH (ref 3.5–5.1)

## 2016-11-06 LAB — MAGNESIUM
MAGNESIUM: 2 mg/dL (ref 1.7–2.4)
Magnesium: 2.5 mg/dL — ABNORMAL HIGH (ref 1.7–2.4)

## 2016-11-06 LAB — PHOSPHORUS: Phosphorus: 2 mg/dL — ABNORMAL LOW (ref 2.5–4.6)

## 2016-11-06 LAB — MRSA PCR SCREENING: MRSA BY PCR: NEGATIVE

## 2016-11-06 MED ORDER — INSULIN ASPART 100 UNIT/ML ~~LOC~~ SOLN
0.0000 [IU] | Freq: Three times a day (TID) | SUBCUTANEOUS | Status: DC
  Administered 2016-11-07: 3 [IU] via SUBCUTANEOUS
  Administered 2016-11-07 (×2): 8 [IU] via SUBCUTANEOUS
  Administered 2016-11-08: 5 [IU] via SUBCUTANEOUS
  Administered 2016-11-08: 3 [IU] via SUBCUTANEOUS
  Administered 2016-11-08 – 2016-11-09 (×2): 5 [IU] via SUBCUTANEOUS

## 2016-11-06 MED ORDER — INSULIN GLARGINE 100 UNIT/ML ~~LOC~~ SOLN
10.0000 [IU] | Freq: Every day | SUBCUTANEOUS | Status: DC
  Filled 2016-11-06: qty 0.1

## 2016-11-06 MED ORDER — SODIUM CHLORIDE 0.9 % IV SOLN: INTRAVENOUS | Status: AC

## 2016-11-06 MED ORDER — POTASSIUM CHLORIDE 20 MEQ PO PACK
40.0000 meq | PACK | Freq: Once | ORAL | Status: AC
  Administered 2016-11-06: 40 meq via ORAL
  Filled 2016-11-06: qty 2

## 2016-11-06 MED ORDER — SODIUM CHLORIDE 0.9 % IV SOLN
INTRAVENOUS | Status: DC
  Administered 2016-11-06: 04:00:00 via INTRAVENOUS

## 2016-11-06 MED ORDER — DEXTROSE-NACL 5-0.45 % IV SOLN
INTRAVENOUS | Status: DC
Start: 2016-11-06 — End: 2016-11-06

## 2016-11-06 MED ORDER — HEPARIN SODIUM (PORCINE) 5000 UNIT/ML IJ SOLN: 5000.0000 [IU] | Freq: Three times a day (TID) | INTRAMUSCULAR | Status: DC

## 2016-11-06 MED ORDER — POTASSIUM CHLORIDE 20 MEQ/15ML (10%) PO SOLN: 40.0000 meq | Freq: Once | ORAL | Status: DC

## 2016-11-06 MED ORDER — LACTATED RINGERS IV SOLN
INTRAVENOUS | Status: AC
  Administered 2016-11-06: 04:00:00 via INTRAVENOUS

## 2016-11-06 MED ORDER — SODIUM CHLORIDE 0.9 % IV SOLN
INTRAVENOUS | Status: DC
  Filled 2016-11-06: qty 2.5

## 2016-11-06 MED ORDER — SODIUM CHLORIDE 0.9 % IV SOLN: INTRAVENOUS | Status: DC

## 2016-11-06 MED ORDER — INSULIN ASPART 100 UNIT/ML ~~LOC~~ SOLN
0.0000 [IU] | Freq: Every day | SUBCUTANEOUS | Status: DC
  Administered 2016-11-08: 4 [IU] via SUBCUTANEOUS

## 2016-11-06 MED ORDER — ENOXAPARIN SODIUM 40 MG/0.4ML ~~LOC~~ SOLN
40.0000 mg | SUBCUTANEOUS | Status: DC
  Administered 2016-11-06 – 2016-11-09 (×4): 40 mg via SUBCUTANEOUS
  Filled 2016-11-06 (×4): qty 0.4

## 2016-11-06 MED ORDER — DEXTROSE-NACL 5-0.45 % IV SOLN
INTRAVENOUS | Status: DC
  Administered 2016-11-06: 05:00:00 via INTRAVENOUS

## 2016-11-06 MED ORDER — INSULIN GLARGINE 100 UNIT/ML ~~LOC~~ SOLN
10.0000 [IU] | Freq: Every day | SUBCUTANEOUS | Status: DC
  Administered 2016-11-06: 10 [IU] via SUBCUTANEOUS
  Filled 2016-11-06: qty 0.1

## 2016-11-06 MED ORDER — SODIUM CHLORIDE 0.45 % IV SOLN
INTRAVENOUS | Status: DC
  Administered 2016-11-06 – 2016-11-08 (×3): via INTRAVENOUS

## 2016-11-06 MED ORDER — SODIUM POLYSTYRENE SULFONATE 15 GM/60ML PO SUSP
15.0000 g | Freq: Once | ORAL | Status: AC
  Administered 2016-11-06: 15 g via ORAL
  Filled 2016-11-06: qty 60

## 2016-11-06 MED ORDER — KCL IN DEXTROSE-NACL 20-5-0.45 MEQ/L-%-% IV SOLN
INTRAVENOUS | Status: DC
  Administered 2016-11-06: 14:00:00 via INTRAVENOUS
  Filled 2016-11-06 (×2): qty 1000

## 2016-11-06 MED ORDER — SODIUM BICARBONATE 8.4 % IV SOLN
INTRAVENOUS | Status: DC
  Filled 2016-11-06: qty 150

## 2016-11-06 MED ORDER — INSULIN ASPART 100 UNIT/ML ~~LOC~~ SOLN
10.0000 [IU] | Freq: Once | SUBCUTANEOUS | Status: AC
  Administered 2016-11-06: 10 [IU] via SUBCUTANEOUS
  Filled 2016-11-06: qty 10

## 2016-11-06 NOTE — Progress Notes (Signed)
Called to phlebotomy to have labs drawn on patient at 3pm. Labs were due at 230pm as patient is on DKA protocol. I was assured someone would be on their way. Now at 4:30 pm still no labs have been drawn. Called back to phlebotomy for STAT IV lab draw for BMP. Labs to be drawn now. Will continue to monitor and follow up as needed.

## 2016-11-06 NOTE — Progress Notes (Addendum)
eLink Physician-Brief Progress Note Patient Name: Spencer HakimRoger A Cristiano DOB: 06/26/1961 MRN: [id number]   Date of Service  11/06/2016  HPI/Events of Note  Patient seen as a transfer from [place].  Pt known to have diabetes, presents with four-day history of nausea vomiting abdominal pain. Fingerstick was 700 mg percent. VBG with pH of 6.9.  Treated as DKA. On insulin drip. On bicarbonate drip as well. Patient has received 3 L IV fluids per ER.  Patient seen, comfortable, not in distress.  Blood pressure 1:30/90, pulse 94, respiratory rate 17, sats 97% on room air.   He denies fevers, chills, cough, dyspnea. only with mild abdominal discomfort.   eICU Interventions  PCCM to assess/admit pt. Will place orders.      Intervention Category Evaluation Type: New Patient Evaluation  [name] 11/06/2016, 2:41 AM
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
## 2016-11-06 NOTE — Progress Notes (Signed)
DKA protocol was followed and pt is off the insulin drip  Last CBG=150.  Orders  states to check cbg 4x daily, before meals and bedtime with appropriate coverage. Dr. Dellie CatholicSommers was made aware of the order for 4x cbg and asked if he wanted pt to be q4 cbg check,  he stated to just check it 4x daily, before meals and bedtime and d/c the q1 cbg checks, Q1 cbg check has been d/c and will continue to monitor pt----Coy Rochford, rn

## 2016-11-06 NOTE — Progress Notes (Signed)
Inpatient Diabetes Program Recommendations  AACE/ADA: New Consensus Statement on Inpatient Glycemic Control (2015)  Target Ranges:  Prepandial:   less than 140 mg/dL      Peak postprandial:   less than 180 mg/dL (1-2 hours)      Critically ill patients:  140 - 180 mg/dL   Lab Results  Component Value Date   GLUCAP 183 (H) 11/06/2016   HGBA1C 12.1 (H) 04/04/2015    Review of Glycemic Control  Outpatient Diabetes medications: Lantus 50 units bid                             Novolog 20 units tidwc                             Januvia 100 mg daily Current orders for Inpatient glycemic control: IV insulin drip  Inpatient Diabetes Program Recommendations:  Noted patient last seen by DM Coordinator 04/05/16. Will plan to speak to patient while in the hospital regarding hx home regimen. When patient meets criteria for D/C IV insulin, will need basal insulin 2 hrs prior to IV drip D/C with correction of CBG when IV insulin D/C. Will follow.  Thank you, Spencer FischerJudy E. Calyssa Zobrist, RN, MSN, CDE Inpatient Glycemic Control Team Team Pager [phone] (8am-5pm) 11/06/2016 11:19 AM

## 2016-11-06 NOTE — Progress Notes (Signed)
eLink Physician-Brief Progress Note Patient Name: Clydell HakimRoger A Swingler DOB: 05/27/1961 MRN: [id number]   Date of Service  11/06/2016  HPI/Events of Note  Gap closed Off drip  Add lantus, ssi  Hs coverage Diet k supp for last k  bmet in am   eICU Interventions       Intervention Category Major Interventions: Acid-Base disturbance - evaluation and management  Nelda BucksFEINSTEIN,Jaysun Wessels J. 11/06/2016, 6:55 PM

## 2016-11-06 NOTE — H&P (Signed)
PULMONARY / CRITICAL CARE MEDICINE   Name: Spencer HakimRoger A Signorelli MRN: [id number] DOB: 01/15/1961    ADMISSION DATE:  11/06/2016  CHIEF COMPLAINT:  DKA  HISTORY OF PRESENT ILLNESS:   56 yo Male with DM (insulin dependent), HTN presented to [facility] with 3-4 days of nausea, vomiting, anorexia and elevated blood sugar.  He states felt similar to previous episodes of DKA.  He reports taking his insulin as prescribed.  He is rather encephalopathic and his ability to provide a history is rather limited.  He denies sick contacts.   PAST MEDICAL HISTORY :   has a past medical history of Brain aneurysm; Diabetes mellitus without complication (HCC); and Hypertension.  has a past surgical history that includes Neck surgery and Hernia repair. Prior to Admission medications   Medication Sig Start Date End Date Taking? Authorizing Provider  aspirin EC 81 MG EC tablet Take 1 tablet (81 mg total) by mouth daily. 04/06/15   Marinda ElkAbraham Feliz Ortiz, MD  hydrochlorothiazide (MICROZIDE) 12.5 MG capsule Take 1 capsule by mouth daily. 04/23/14   Historical Provider, MD  ibuprofen (ADVIL,MOTRIN) 600 MG tablet Take 1 tablet (600 mg total) by mouth every 8 (eight) hours as needed. Patient taking differently: Take 600 mg by mouth every 8 (eight) hours as needed for mild pain.  03/25/15   Phineas SemenGraydon Goodman, MD  insulin aspart (NOVOLOG) 100 UNIT/ML FlexPen Inject 10 Units into the skin 3 (three) times daily with meals. Patient taking differently: Inject 20 Units into the skin 3 (three) times daily with meals.  04/06/15   Marinda ElkAbraham Feliz Ortiz, MD  insulin glargine (LANTUS) 100 UNIT/ML injection Inject 0.65 mLs (65 Units total) into the skin 2 (two) times daily. Patient taking differently: Inject 60 Units into the skin 2 (two) times daily.  04/06/15   Marinda ElkAbraham Feliz Ortiz, MD  lisinopril (PRINIVIL,ZESTRIL) 20 MG tablet Take 1 tablet by mouth daily. 03/14/14   Historical Provider, MD  oxyCODONE-acetaminophen (ROXICET) 5-325 MG tablet Take 1  tablet by mouth every 6 (six) hours as needed for severe pain. 08/12/16   Sharman CheekPhillip Stafford, MD  sitaGLIPtin (JANUVIA) 100 MG tablet Take 1 tablet (100 mg total) by mouth daily. 03/26/15   Irean HongJade J Sung, MD   No Known Allergies  FAMILY HISTORY:  indicated that the status of his sister is unknown.   SOCIAL HISTORY:  reports that he has been smoking Cigarettes.  He has a 17.00 pack-year smoking history. He has never used smokeless tobacco. He reports that he drinks about 0.6 oz of alcohol per week . He reports that he does not use drugs.  REVIEW OF SYSTEMS:   + for: n/v, anorexia, diaphoresis, chest pain, decreased UOP Neg for: F/c, diarrhea, abdominal pain, SOB, sick contacts, weight changes, heat/cold intolerance     VITAL SIGNS: Temp:  [97.5 F (36.4 C)] 97.5 F (36.4 C) (01/01 0045) Pulse Rate:  [94-103] 94 (01/01 0210) Resp:  [17-34] 18 (01/01 0210) BP: (106-190)/(51-112) 131/91 (01/01 0210) SpO2:  [99 %-100 %] 99 % (01/01 0210) Weight:  [69.9 kg (154 lb 1.6 oz)-77.1 kg (170 lb)] 69.9 kg (154 lb 1.6 oz) (01/01 0210) HEMODYNAMICS:   VENTILATOR SETTINGS:   INTAKE / OUTPUT: No intake or output data in the 24 hours ending 11/06/16 0243  PHYSICAL EXAMINATION: General:  Somnolent but responsive.  AAOx2 Neuro:  CN II-XII intact HEENT:  Dry MM, NCAT Cardiovascular:  RRR, no m/r/g Lungs:  CTA b/l no w/r/r Abdomen:  Soft, nontender, non distended. No rebound or gauding  Musculoskeletal:  Thin, but normal bulk and tone Skin:  No c/c/e  LABS:  CBC  Recent Labs Lab 11/05/16 2148  WBC 27.5*  HGB 17.3  HCT 55.3*  PLT 223   Coag's No results for input(s): APTT, INR in the last 168 hours. BMET  Recent Labs Lab 11/05/16 2148 11/05/16 2330 11/06/16 0031  NA 129* 131* 131*  K 5.4* 6.1* 5.8*  5.8*  CL 96* 101 104  CO2 <7* <7* <7*  BUN 43* 42* 40*  CREATININE 2.35* 2.03* 1.92*  GLUCOSE 870* 770* 728*   Electrolytes  Recent Labs Lab 11/05/16 2148 11/05/16 2330  11/06/16 0031  CALCIUM 9.6 8.7* 8.2*   Sepsis Markers  Recent Labs Lab 11/05/16 2148 11/06/16 0050  LATICACIDVEN 2.1* 1.4   ABG No results for input(s): PHART, PCO2ART, PO2ART in the last 168 hours. Liver Enzymes  Recent Labs Lab 11/05/16 2148  AST 20  ALT 19  ALKPHOS 145*  BILITOT 2.4*  ALBUMIN 4.9   Cardiac Enzymes  Recent Labs Lab 11/05/16 2148  TROPONINI <0.03   Glucose  Recent Labs Lab 11/05/16 2147 11/05/16 2319 11/06/16 0034 11/06/16 0219  GLUCAP >600* >600* >600* 453*    Imaging Dg Abd 1 View  Result Date: 11/05/2016 CLINICAL DATA:  Abdominal pain with vomiting EXAM: ABDOMEN - 1 VIEW COMPARISON:  07/08/2009 FINDINGS: Visualized lung bases are clear. Metallic BB right upper quadrant. Moderate gastric dilatation. Nonobstructed bowel gas pattern with moderate stool in the colon. Probable calcified pelvic phleboliths. Stable sclerotic lesion right femoral neck. IMPRESSION: Nonobstructed bowel-gas pattern. Moderate air distention of the stomach. Electronically Signed   By: Jasmine Pang M.D.   On: 11/05/2016 22:41     ASSESSMENT / PLAN: 56 yo make with DKA and acute encephalopathy likely metabolic given DKA.  PULMONARY  A: Not active P:     CARDIOVASCULAR A:  HTN P:  - holding ACE-I given DKA  RENAL A:   AGMA NAGMA HyperKalemia P:   - treatment of underlying DKA - NS x1 L (got 3L at [facility]) - LR gtt @125 /hr x2 hours - EKG - Got Kayexcellate at [facility] - Stop Bicarb gtt (started at [facility]) - No further potassium lowering therapy given DKA and known total body hypoK - BMP, Mag, Phos Q4hours - insulin gtt - lactate now cleared - check UA -Holding ACE-i  GASTROINTESTINAL A:   Mild transamonitis P:   - APAP negative - ASA level negative - check lipase - recheck LFTs in AM   HEMATOLOGIC A:   Leukocytosis P:  - pan culture - no abx for now - no obvious source of infection - CXR  INFECTIOUS A:   See above  P:    BCx2 11/06/2016 UC 11/06/2016 Sputum NA Abx: Holding for now  ENDOCRINE A:   DKA    P:   - DKA protocol  NEUROLOGIC A:   Acute metabolic encephalopathy - 2/2 DKA P:   RASS goal: n/a - DKA treatment as above.   DVT PPx: Lovenox Code status: FC/FT  Total critical care time: 30 min  Critical care time was exclusive of separately billable procedures and treating other patients.  Critical care was necessary to treat or prevent imminent or life-threatening deterioration.  Critical care was time spent personally by me on the following activities: development of treatment plan with patient and/or surrogate as well as nursing, discussions with consultants, evaluation of patient's response to treatment, examination of patient, obtaining history from patient or surrogate, ordering and performing  treatments and interventions, ordering and review of laboratory studies, ordering and review of radiographic studies, pulse oximetry and re-evaluation of patient's condition.   Galvin Proffer, DO., MS [facility]       Pulmonary and Critical Care Medicine [name] Pager: [phone]  11/06/2016, 2:43 AM

## 2016-11-07 DIAGNOSIS — E0810 Diabetes mellitus due to underlying condition with ketoacidosis without coma: Secondary | ICD-10-CM

## 2016-11-07 LAB — GLUCOSE, CAPILLARY
GLUCOSE-CAPILLARY: 193 mg/dL — AB (ref 65–99)
GLUCOSE-CAPILLARY: 150 mg/dL — AB (ref 65–99)
GLUCOSE-CAPILLARY: 163 mg/dL — AB (ref 65–99)
Glucose-Capillary: 100 mg/dL — ABNORMAL HIGH (ref 65–99)
Glucose-Capillary: 112 mg/dL — ABNORMAL HIGH (ref 65–99)
Glucose-Capillary: 113 mg/dL — ABNORMAL HIGH (ref 65–99)
Glucose-Capillary: 119 mg/dL — ABNORMAL HIGH (ref 65–99)
Glucose-Capillary: 180 mg/dL — ABNORMAL HIGH (ref 65–99)
Glucose-Capillary: 160 mg/dL — ABNORMAL HIGH (ref 65–99)
Glucose-Capillary: 167 mg/dL — ABNORMAL HIGH (ref 65–99)
Glucose-Capillary: 269 mg/dL — ABNORMAL HIGH (ref 65–99)
Glucose-Capillary: 191 mg/dL — ABNORMAL HIGH (ref 65–99)
Glucose-Capillary: 266 mg/dL — ABNORMAL HIGH (ref 65–99)

## 2016-11-07 LAB — BASIC METABOLIC PANEL
ANION GAP: 10 (ref 5–15)
ANION GAP: 13 (ref 5–15)
Anion gap: 7 (ref 5–15)
BUN: 18 mg/dL (ref 6–20)
BUN: 18 mg/dL (ref 6–20)
BUN: 14 mg/dL (ref 6–20)
CALCIUM: 8.6 mg/dL — AB (ref 8.9–10.3)
CALCIUM: 8.4 mg/dL — AB (ref 8.9–10.3)
CALCIUM: 8.8 mg/dL — AB (ref 8.9–10.3)
CO2: 15 mmol/L — ABNORMAL LOW (ref 22–32)
CO2: 20 mmol/L — AB (ref 22–32)
CO2: 16 mmol/L — AB (ref 22–32)
CREATININE: 0.83 mg/dL (ref 0.61–1.24)
CREATININE: 0.84 mg/dL (ref 0.61–1.24)
Chloride: 110 mmol/L (ref 101–111)
Chloride: 105 mmol/L (ref 101–111)
Chloride: 106 mmol/L (ref 101–111)
Creatinine, Ser: 0.88 mg/dL (ref 0.61–1.24)
GFR calc Af Amer: >60 mL/min (ref >60)
GFR calc Af Amer: >60 mL/min (ref >60)
GFR calc non Af Amer: >60 mL/min (ref >60)
GFR calc non Af Amer: >60 mL/min (ref >60)
GLUCOSE: 230 mg/dL — AB (ref 65–99)
GLUCOSE: 261 mg/dL — AB (ref 65–99)
Glucose, Bld: 146 mg/dL — ABNORMAL HIGH (ref 65–99)
Potassium: 3 mmol/L — ABNORMAL LOW (ref 3.5–5.1)
Potassium: 3.5 mmol/L (ref 3.5–5.1)
Potassium: 3.4 mmol/L — ABNORMAL LOW (ref 3.5–5.1)
Sodium: 135 mmol/L (ref 135–145)
Sodium: 132 mmol/L — ABNORMAL LOW (ref 135–145)
Sodium: 135 mmol/L (ref 135–145)

## 2016-11-07 LAB — HEPATIC FUNCTION PANEL
ALT: 12 U/L — ABNORMAL LOW (ref 17–63)
AST: 18 U/L (ref 15–41)
Albumin: 2.7 g/dL — ABNORMAL LOW (ref 3.5–5.0)
Alkaline Phosphatase: 60 U/L (ref 38–126)
BILIRUBIN DIRECT: 0.1 mg/dL (ref 0.1–0.5)
BILIRUBIN INDIRECT: 0.6 mg/dL (ref 0.3–0.9)
Total Bilirubin: 0.7 mg/dL (ref 0.3–1.2)
Total Protein: 5.1 g/dL — ABNORMAL LOW (ref 6.5–8.1)

## 2016-11-07 LAB — PHOSPHORUS: Phosphorus: <1 mg/dL — CL (ref 2.5–4.6)

## 2016-11-07 MED ORDER — INSULIN ASPART 100 UNIT/ML ~~LOC~~ SOLN: 10.0000 [IU] | Freq: Three times a day (TID) | SUBCUTANEOUS | Status: DC

## 2016-11-07 MED ORDER — K PHOS MONO-SOD PHOS DI & MONO 155-852-130 MG PO TABS
500.0000 mg | ORAL_TABLET | Freq: Two times a day (BID) | ORAL | Status: AC
  Administered 2016-11-07 (×2): 500 mg via ORAL
  Filled 2016-11-07 (×2): qty 2

## 2016-11-07 MED ORDER — POTASSIUM CHLORIDE CRYS ER 20 MEQ PO TBCR
40.0000 meq | EXTENDED_RELEASE_TABLET | Freq: Two times a day (BID) | ORAL | Status: AC
Start: 2016-11-07 — End: 2016-11-07
  Administered 2016-11-07 (×2): 40 meq via ORAL
  Filled 2016-11-07 (×3): qty 2

## 2016-11-07 MED ORDER — PNEUMOCOCCAL VAC POLYVALENT 25 MCG/0.5ML IJ INJ
0.5000 mL | INJECTION | INTRAMUSCULAR | Status: AC
  Administered 2016-11-08: 0.5 mL via INTRAMUSCULAR
  Filled 2016-11-07: qty 0.5

## 2016-11-07 MED ORDER — ENSURE ENLIVE PO LIQD
237.0000 mL | Freq: Two times a day (BID) | ORAL | Status: DC
  Administered 2016-11-07 – 2016-11-08 (×2): 237 mL via ORAL

## 2016-11-07 MED ORDER — ACETAMINOPHEN 325 MG PO TABS
650.0000 mg | ORAL_TABLET | Freq: Four times a day (QID) | ORAL | Status: DC | PRN
  Administered 2016-11-07: 650 mg via ORAL
  Filled 2016-11-07: qty 2

## 2016-11-07 MED ORDER — IBUPROFEN 600 MG PO TABS
600.0000 mg | ORAL_TABLET | Freq: Once | ORAL | Status: AC
  Administered 2016-11-07: 600 mg via ORAL
  Filled 2016-11-07 (×2): qty 1

## 2016-11-07 MED ORDER — LISINOPRIL 20 MG PO TABS
20.0000 mg | ORAL_TABLET | Freq: Every day | ORAL | Status: DC
  Administered 2016-11-07 – 2016-11-09 (×3): 20 mg via ORAL
  Filled 2016-11-07 (×4): qty 1

## 2016-11-07 MED ORDER — INSULIN GLARGINE 100 UNIT/ML ~~LOC~~ SOLN
20.0000 [IU] | Freq: Every day | SUBCUTANEOUS | Status: DC
  Administered 2016-11-07: 20 [IU] via SUBCUTANEOUS
  Filled 2016-11-07: qty 0.2

## 2016-11-07 MED ORDER — INSULIN ASPART 100 UNIT/ML ~~LOC~~ SOLN: 5.0000 [IU] | Freq: Three times a day (TID) | SUBCUTANEOUS | Status: DC

## 2016-11-07 MED ORDER — POTASSIUM PHOSPHATES 15 MMOLE/5ML IV SOLN
40.0000 meq | Freq: Once | INTRAVENOUS | Status: AC
  Administered 2016-11-07: 40 meq via INTRAVENOUS
  Filled 2016-11-07: qty 9.09

## 2016-11-07 MED ORDER — INSULIN ASPART 100 UNIT/ML ~~LOC~~ SOLN
5.0000 [IU] | Freq: Three times a day (TID) | SUBCUTANEOUS | Status: DC
  Administered 2016-11-07 – 2016-11-09 (×6): 5 [IU] via SUBCUTANEOUS

## 2016-11-07 MED ORDER — INFLUENZA VAC SPLIT QUAD 0.5 ML IM SUSY
0.5000 mL | PREFILLED_SYRINGE | INTRAMUSCULAR | Status: AC
Start: 2016-11-08 — End: 2016-11-08
  Administered 2016-11-08: 0.5 mL via INTRAMUSCULAR
  Filled 2016-11-07: qty 0.5

## 2016-11-07 MED ORDER — POTASSIUM CHLORIDE 20 MEQ PO PACK: 40.0000 meq | PACK | Freq: Two times a day (BID) | ORAL | Status: DC

## 2016-11-07 NOTE — Progress Notes (Signed)
eLink Physician-Brief Progress Note Patient Name: Spencer HakimRoger A Stamour DOB: 03/01/1961 MRN: [id number]   Date of Service  11/07/2016  HPI/Events of Note  pain  eICU Interventions  crt wnl Home ibuprofen      Intervention Category Minor Interventions: Routine modifications to care plan (e.g. PRN medications for pain, fever)  Nelda BucksFEINSTEIN,Efton Thomley J. 11/07/2016, 8:35 PM

## 2016-11-07 NOTE — Progress Notes (Signed)
Received report on patient -

## 2016-11-07 NOTE — Progress Notes (Signed)
Report called to receiving floor. Patient received lunch tray, will allow him to finish his meal then transfer to floor.

## 2016-11-07 NOTE — Progress Notes (Signed)
Pt arrived on unit. Pt in stable condition. Placed on telemetry, CCMD notified. Pt oriented to unit. Callbell within reach. Will continue to monitor.

## 2016-11-07 NOTE — Progress Notes (Signed)
PULMONARY / CRITICAL CARE MEDICINE   Name: Spencer [name] MRN: [id number] DOB: 07-Jul-1961    ADMISSION DATE:  11/06/2016  CHIEF COMPLAINT:  DKA  HISTORY OF PRESENT ILLNESS:   56 yo Male with DM (insulin dependent), HTN presented to [facility] with 3-4 days of nausea, vomiting, anorexia and elevated blood sugar.  He states felt similar to previous episodes of DKA.  He reports taking his insulin as prescribed.  Encephalopathic on presentation with limited ability to provide a history. Mental status now improved.  He denies sick contacts. UDS + cocaine.   Subjective / Interval Events:  Off Insulin drip  Tolerating PO  +diarrhea   VITAL SIGNS: Temp:  [97.5 F (36.4 C)-99.2 F (37.3 C)] 97.8 F (36.6 C) (01/02 0840) Pulse Rate:  [71-95] 80 (01/02 1100) Resp:  [14-23] 20 (01/02 1100) BP: (105-138)/(72-90) 118/76 (01/02 1100) SpO2:  [97 %-100 %] 99 % (01/02 1100) HEMODYNAMICS:   VENTILATOR SETTINGS:   INTAKE / OUTPUT:  Intake/Output Summary (Last 24 hours) at 11/07/16 1134 Last data filed at 11/07/16 1100  Gross per 24 hour  Intake          1801.23 ml  Output             1825 ml  Net           -23.77 ml    PHYSICAL EXAMINATION: General:  Alert & Oriented x 3 Neuro:  CN II-XII intact HEENT:  Dry MM, NCAT Cardiovascular:  RRR, no m/r/g Lungs:  CTA b/l no w/r/r Abdomen:  Soft, nontender, non distended. No rebound Musculoskeletal:  Thin, but normal bulk and tone Skin:  No c/c/e  LABS:  CBC  Recent Labs Lab 11/05/16 2148 11/06/16 0256 11/06/16 0813  WBC 27.5* 19.0* 15.8*  HGB 17.3 15.9 14.3  HCT 55.3* 45.7 40.5  PLT 223 159 152   Coag's No results for input(s): APTT, INR in the last 168 hours. BMET  Recent Labs Lab 11/06/16 1038 11/06/16 1639 11/07/16 0701  NA 141 141 135  K 3.1* 3.6 3.0*  CL 116* 116* 110  CO2 18* 18* 15*  BUN 28* 26* 18  CREATININE 1.39* 1.25* 0.88  GLUCOSE 120* 99 146*   Electrolytes  Recent Labs Lab 11/06/16 0256 11/06/16 0813  11/06/16 1038 11/06/16 1639 11/07/16 0701  CALCIUM 9.5 9.0 9.0 8.8* 8.6*  MG 2.5* 2.0  --   --   --   PHOS 2.0*  --   --   --  <1.0*   Sepsis Markers  Recent Labs Lab 11/05/16 2148 11/06/16 0050  LATICACIDVEN 2.1* 1.4   ABG No results for input(s): PHART, PCO2ART, PO2ART in the last 168 hours. Liver Enzymes  Recent Labs Lab 11/05/16 2148  AST 20  ALT 19  ALKPHOS 145*  BILITOT 2.4*  ALBUMIN 4.9   Cardiac Enzymes  Recent Labs Lab 11/05/16 2148  TROPONINI <0.03   Glucose  Recent Labs Lab 11/06/16 2132 11/06/16 2230 11/06/16 2323 11/07/16 0000 11/07/16 0412 11/07/16 0838  GLUCAP 193* 180* 150* 138* 160* 167*    Imaging No results found.   ASSESSMENT / PLAN: 56 yo make with DKA and acute encephalopathy likely metabolic given DKA.  PULMONARY A: Not active P:     CARDIOVASCULAR A:  HTN P:  - restart ACE-I, consider HCTZ on 1/3 - Normotensive   RENAL A:   AGMA NAGMA HyperKalemia, resolved yesterday P:   - Morning BMP pending - Off insulin gtt, AG closed x 3. Likely transfer  to floor today.  - Replace K+ as needed - BMP, Mag, Phos Daily  - Holding ACE-i  GASTROINTESTINAL A:   Mild transamonitis Diarrhea P:   - APAP negative - ASA level negative - Repeat LFTs pending   HEMATOLOGIC A:   Leukocytosis, improving P:  - Pan culture - No abx for now  INFECTIOUS A:   See above  P:   BCx2 11/06/2016 UC 11/06/2016 Sputum NA Abx: Holding for now; afebrile   ENDOCRINE A:   DKA P:   - Volume resuscitation and insulin per DKA protocol - 1/2 NS 75 cc/hr currently  - Gap closed x 3, off insulin drip - Lantus 10 QHS - SSI mod TID AC & QHS - CBG QID AC & QHS - Likely transfer to floor today  NEUROLOGIC A:   Acute metabolic encephalopathy - 2/2 DKA, improved am 1/1 P:   RASS goal: n/a - Mental status at baseline  - DKA treatment as above.  DVT PPx: Lovenox Code status: FC/FT  Independent CC time 31 minutes  Attending  Note:  I have examined patient, reviewed labs, studies and notes. I have discussed the case with dr Antony ContrasGuilloud, and I agree with the data and plans as amended above. Admitted with Ag acidosis and NAG acidosis in setting DKA. On eval he is now off insulin gtt. Awake and interacting. Clear lung exam. We will continue to titrate his SQ insulin regimen, replace electrolytes, [facility]  Independent critical care time is 32 minutes.   Levy Pupaobert Ravon Mcilhenny, MD, PhD 11/07/2016, 11:38 AM [facility] [phone] or if no answer [phone]

## 2016-11-08 DIAGNOSIS — E43 Unspecified severe protein-calorie malnutrition: Secondary | ICD-10-CM | POA: Insufficient documentation

## 2016-11-08 DIAGNOSIS — Z794 Long term (current) use of insulin: Secondary | ICD-10-CM

## 2016-11-08 DIAGNOSIS — E1310 Other specified diabetes mellitus with ketoacidosis without coma: Secondary | ICD-10-CM

## 2016-11-08 LAB — BASIC METABOLIC PANEL
ANION GAP: 8 (ref 5–15)
ANION GAP: 6 (ref 5–15)
BUN: 13 mg/dL (ref 6–20)
BUN: 10 mg/dL (ref 6–20)
CALCIUM: 8.8 mg/dL — AB (ref 8.9–10.3)
CHLORIDE: 109 mmol/L (ref 101–111)
CO2: 22 mmol/L (ref 22–32)
CO2: 23 mmol/L (ref 22–32)
Calcium: 8.5 mg/dL — ABNORMAL LOW (ref 8.9–10.3)
Chloride: 106 mmol/L (ref 101–111)
Creatinine, Ser: 0.76 mg/dL (ref 0.61–1.24)
Creatinine, Ser: 0.7 mg/dL (ref 0.61–1.24)
GFR calc non Af Amer: >60 mL/min (ref >60)
GFR calc non Af Amer: >60 mL/min (ref >60)
GLUCOSE: 263 mg/dL — AB (ref 65–99)
GLUCOSE: 222 mg/dL — AB (ref 65–99)
POTASSIUM: 2.9 mmol/L — AB (ref 3.5–5.1)
Potassium: 2.8 mmol/L — ABNORMAL LOW (ref 3.5–5.1)
Sodium: 136 mmol/L (ref 135–145)
Sodium: 138 mmol/L (ref 135–145)

## 2016-11-08 LAB — GLUCOSE, CAPILLARY
GLUCOSE-CAPILLARY: 312 mg/dL — AB (ref 65–99)
GLUCOSE-CAPILLARY: 245 mg/dL — AB (ref 65–99)
Glucose-Capillary: 208 mg/dL — ABNORMAL HIGH (ref 65–99)
Glucose-Capillary: 182 mg/dL — ABNORMAL HIGH (ref 65–99)
Glucose-Capillary: 305 mg/dL — ABNORMAL HIGH (ref 65–99)

## 2016-11-08 LAB — PHOSPHORUS: Phosphorus: 1.5 mg/dL — ABNORMAL LOW (ref 2.5–4.6)

## 2016-11-08 MED ORDER — PANTOPRAZOLE SODIUM 40 MG PO TBEC
40.0000 mg | DELAYED_RELEASE_TABLET | Freq: Every day | ORAL | Status: DC
  Administered 2016-11-08 – 2016-11-09 (×2): 40 mg via ORAL
  Filled 2016-11-08 (×2): qty 1

## 2016-11-08 MED ORDER — INSULIN GLARGINE 100 UNIT/ML ~~LOC~~ SOLN
35.0000 [IU] | Freq: Every day | SUBCUTANEOUS | Status: DC
  Administered 2016-11-08: 35 [IU] via SUBCUTANEOUS
  Filled 2016-11-08: qty 0.35

## 2016-11-08 MED ORDER — ADULT MULTIVITAMIN W/MINERALS CH
1.0000 | ORAL_TABLET | Freq: Every day | ORAL | Status: DC
  Administered 2016-11-08 – 2016-11-09 (×2): 1 via ORAL
  Filled 2016-11-08 (×2): qty 1

## 2016-11-08 MED ORDER — POTASSIUM CHLORIDE CRYS ER 20 MEQ PO TBCR
40.0000 meq | EXTENDED_RELEASE_TABLET | ORAL | Status: AC
  Administered 2016-11-08 (×3): 40 meq via ORAL
  Filled 2016-11-08 (×3): qty 2

## 2016-11-08 MED ORDER — DEXTROSE 5 % IV SOLN
30.0000 mmol | Freq: Once | INTRAVENOUS | Status: AC
  Administered 2016-11-08: 30 mmol via INTRAVENOUS
  Filled 2016-11-08: qty 10

## 2016-11-08 MED ORDER — GLUCERNA SHAKE PO LIQD
237.0000 mL | Freq: Three times a day (TID) | ORAL | Status: DC
  Administered 2016-11-08 – 2016-11-09 (×3): 237 mL via ORAL

## 2016-11-08 MED ORDER — POTASSIUM CHLORIDE IN NACL 20-0.9 MEQ/L-% IV SOLN
INTRAVENOUS | Status: DC
  Administered 2016-11-08: 1000 mL via INTRAVENOUS
  Administered 2016-11-09: 09:00:00 via INTRAVENOUS
  Filled 2016-11-08 (×2): qty 1000

## 2016-11-08 MED ORDER — ALUM & MAG HYDROXIDE-SIMETH 200-200-20 MG/5ML PO SUSP: 30.0000 mL | Freq: Four times a day (QID) | ORAL | Status: DC | PRN

## 2016-11-08 NOTE — Progress Notes (Addendum)
Initial Nutrition Assessment  DOCUMENTATION CODES:   Severe malnutrition in context of chronic illness  INTERVENTION:   Glucerna Shake po TID, each supplement provides 220 kcal and 10 grams of protein  Multivitamin daily   NUTRITION DIAGNOSIS:   Malnutrition related to chronic illness as evidenced by severe depletion of muscle mass, severe depletion of body fat, percent weight loss.  GOAL:   Patient will meet greater than or equal to 90% of their needs  MONITOR:   PO intake, Supplement acceptance  REASON FOR ASSESSMENT:   Malnutrition Screening Tool    ASSESSMENT:   56 yo Male with DM (insulin dependent), HTN presented to [facility] with 3-4 days of nausea, vomiting, anorexia and elevated blood sugar.  He states felt similar to previous episodes of DKA.  He reports taking his insulin as prescribed.  Encephalopathic on presentation with limited ability to provide a history. Mental status now improved.  He denies sick contacts. UDS + cocaine.  Admitted for DKA and encephalopathy    Met with pt in room today. Pt reports poor appetite for 1 week pta. Pt reports vomiting and diarrhea for 1 week pta and states that breakfast today was the first meal that he has kept down and he ate 100%. Pt reports abdominal paid and severe reflux at times where he spits up acid. Pt has lost 19lbs(11%) in three months. This is severe. Pt with elevated cbgs. Spoke with pt at length about following a diabetic diet. Pt needs surgery on his foot and is unable to have surgery until his AIC is lower per pt report. Pt with low phosphorus today. Pt scheduled to have recheck labs tomorrow.    Medications reviewed and include: lovenox, insulin, KCl  Labs reviewed: K 2.8(L), Ca 8.8(L) adj. 9.84 wnl, P 1.6(L), Alb 2.7(L), Mg 2.0 wnl Wbc- 15.8(H) CBGs- 146, 230, 261, 263, 222  Nutrition-Focused physical exam completed. Findings are severe fat depletion, severe muscle depletion, and no edema.   Diet Order:   Diet Carb Modified Fluid consistency: Thin; Room service appropriate? Yes  Skin:  Reviewed, no issues  Last BM:  1/2- diarrhea   Height:   Ht Readings from Last 1 Encounters:  11/07/16 6' 1"  (1.854 m)    Weight:   Wt Readings from Last 1 Encounters:  11/07/16 159 lb 11.2 oz (72.4 kg)    Ideal Body Weight:  83.6 kg  BMI:  Body mass index is 21.07 kg/m.  Estimated Nutritional Needs:   Kcal:  2200-2500kcal/day   Protein:  100-115g/day   Fluid:  >2.2L/day   EDUCATION NEEDS:   No education needs identified at this time  Koleen Distance, RD, LDN Pager #[phone]
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
## 2016-11-09 LAB — BASIC METABOLIC PANEL
ANION GAP: 6 (ref 5–15)
BUN: 12 mg/dL (ref 6–20)
CHLORIDE: 106 mmol/L (ref 101–111)
CO2: 24 mmol/L (ref 22–32)
Calcium: 9.2 mg/dL (ref 8.9–10.3)
Creatinine, Ser: 0.76 mg/dL (ref 0.61–1.24)
Glucose, Bld: 248 mg/dL — ABNORMAL HIGH (ref 65–99)
POTASSIUM: 4 mmol/L (ref 3.5–5.1)
SODIUM: 136 mmol/L (ref 135–145)

## 2016-11-09 LAB — CBC
HCT: 37.2 % — ABNORMAL LOW (ref 39.0–52.0)
HEMOGLOBIN: 12.9 g/dL — AB (ref 13.0–17.0)
MCH: 31.5 pg (ref 26.0–34.0)
MCHC: 34.7 g/dL (ref 30.0–36.0)
MCV: 90.7 fL (ref 78.0–100.0)
PLATELETS: 121 10*3/uL — AB (ref 150–400)
RBC: 4.1 MIL/uL — AB (ref 4.22–5.81)
RDW: 12.8 % (ref 11.5–15.5)
WBC: 5 10*3/uL (ref 4.0–10.5)

## 2016-11-09 LAB — PHOSPHORUS: PHOSPHORUS: 2.8 mg/dL (ref 2.5–4.6)

## 2016-11-09 LAB — MAGNESIUM: MAGNESIUM: 1.9 mg/dL (ref 1.7–2.4)

## 2016-11-09 LAB — GLUCOSE, CAPILLARY: Glucose-Capillary: 209 mg/dL — ABNORMAL HIGH (ref 65–99)

## 2016-11-09 MED ORDER — GLUCERNA SHAKE PO LIQD: 237.0000 mL | Freq: Three times a day (TID) | ORAL | 0 refills | Status: DC

## 2016-11-09 MED ORDER — INSULIN GLARGINE 100 UNIT/ML ~~LOC~~ SOLN: 35.0000 [IU] | Freq: Two times a day (BID) | SUBCUTANEOUS | 11 refills | Status: DC
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
## 2016-11-09 NOTE — Progress Notes (Signed)
NURSING PROGRESS NOTE  Spencer [name] [id number] Discharge Data: 11/09/2016 11:38 AM Attending Provider: Starleen Armsawood S Elgergawy, MD PCP:No PCP Per Patient     Spencer [name] to be D/C'd Home per MD order.  Discussed with the patient the After Visit Summary and all questions fully answered. All IV's discontinued with no bleeding noted. All belongings returned to patient for patient to take home.   Last Vital Signs:  Blood pressure 106/69, pulse 64, temperature 97.6 F (36.4 C), temperature source Oral, resp. rate 18, height 6\' 1"  (1.854 m), weight 72.4 kg (159 lb 11.2 oz), SpO2 99 %.  Discharge Medication List Allergies as of 11/09/2016   No Known Allergies     Medication List    STOP taking these medications   ibuprofen 800 MG tablet Commonly known as:  ADVIL,MOTRIN     TAKE these medications   feeding supplement (GLUCERNA SHAKE) Liqd Take 237 mLs by mouth 3 (three) times daily between meals.   insulin aspart 100 UNIT/ML FlexPen Commonly known as:  NOVOLOG Inject 10 Units into the skin 3 (three) times daily with meals. What changed:  how much to take   insulin glargine 100 UNIT/ML injection Commonly known as:  LANTUS Inject 0.35 mLs (35 Units total) into the skin 2 (two) times daily. What changed:  how much to take

## 2016-11-09 NOTE — Evaluation (Signed)
Physical Therapy Evaluation Patient Details Name: Spencer HakimRoger A Monsanto MRN: [id number] DOB: 09/29/1961 Today's Date: 11/09/2016   History of Present Illness  Pt is a 56 y/o male with PMH of DM and HTN, admitted for episode of DKA. Pt (+) for cocaine on admission.   Clinical Impression  Pt tolerated session well.  No evidence of significant balance deficit or weakness.  PT educated pt on maintaining active lifestyle and managing L calcaneus fx pain with use of crutches PRN (already has crutches at home).  Pt verbalized understanding.  No further PT needs at this time.     Follow Up Recommendations No PT follow up    Equipment Recommendations  None recommended by PT    Recommendations for Other Services       Precautions / Restrictions Precautions Precautions: None Required Braces or Orthoses: Other Brace/Splint Other Brace/Splint: has a cam walker boot for LLE for old L calcaneus fracture Restrictions Weight Bearing Restrictions: No      Mobility  Bed Mobility Overal bed mobility: Independent                Transfers Overall transfer level: Independent                  Ambulation/Gait Ambulation/Gait assistance: Modified independent (Device/Increase time) Ambulation Distance (Feet): 100 Feet Assistive device: None Gait Pattern/deviations: Antalgic Gait velocity: decreased 2/2 pain Gait velocity interpretation: Below normal speed for age/gender    Stairs            Wheelchair Mobility    Modified Rankin (Stroke Patients Only)       Balance Overall balance assessment: Modified Independent (stands at sink and shaves w/o UE support, dons socks indep)                                           Pertinent Vitals/Pain Pain Assessment: No/denies pain    Home Living Family/patient expects to be discharged to:: Private residence Living Arrangements: Other (Comment) Available Help at Discharge: Friend(s);Available  PRN/intermittently;Neighbor (room mate) Type of Home: House Home Access: Stairs to enter Entrance Stairs-Rails: Can reach both Entrance Stairs-Number of Steps: 3 Home Layout: One level Home Equipment: Crutches      Prior Function Level of Independence: Independent with assistive device(s)         Comments: occasional use of single or B axillary crutches for ankle fx that occurred >3 months ago     Hand Dominance   Dominant Hand: Right    Extremity/Trunk Assessment        Lower Extremity Assessment Lower Extremity Assessment: Overall WFL for tasks assessed    Cervical / Trunk Assessment Cervical / Trunk Assessment: Normal  Communication   Communication: No difficulties  Cognition Arousal/Alertness: Awake/alert Behavior During Therapy: WFL for tasks assessed/performed Overall Cognitive Status: Within Functional Limits for tasks assessed                      General Comments      Exercises     Assessment/Plan    PT Assessment Patent does not need any further PT services  PT Problem List            PT Treatment Interventions      PT Goals (Current goals can be found in the Care Plan section)  Acute Rehab PT Goals Patient Stated Goal: to go home PT  Goal Formulation: With patient Time For Goal Achievement: 11/09/16 Potential to Achieve Goals: Good    Frequency     Barriers to discharge        Co-evaluation               End of Session   Activity Tolerance: Patient tolerated treatment well Patient left: in bed;with call bell/phone within reach Nurse Communication: Mobility status         Time: 1015-1040 PT Time Calculation (min) (ACUTE ONLY): 25 min   Charges:   PT Evaluation $PT Eval Low Complexity: 1 Procedure PT Treatments $Therapeutic Activity: 8-22 mins   PT G Codes:        Elsie Baynes E Penven-Crew 11/09/2016, 11:52 AM

## 2016-11-09 NOTE — Discharge Instructions (Signed)
Follow with Primary MD in 7 days   Get CBC, CMP,  checked  by Primary MD next visit.    Activity: As tolerated with Full fall precautions use walker/cane & assistance as needed   Disposition Home    Diet: Heart Healthy , carb modified, with feeding assistance and aspiration precautions.   On your next visit with your primary care physician please Get Medicines reviewed and adjusted.   Please request your Prim.MD to go over all Hospital Tests and Procedure/Radiological results at the follow up, please get all Hospital records sent to your Prim MD by signing hospital release before you go home.   If you experience worsening of your admission symptoms, develop shortness of breath, life threatening emergency, suicidal or homicidal thoughts you must seek medical attention immediately by calling 911 or calling your MD immediately  if symptoms less severe.  You Must read complete instructions/literature along with all the possible adverse reactions/side effects for all the Medicines you take and that have been prescribed to you. Take any new Medicines after you have completely understood and accpet all the possible adverse reactions/side effects.   Do not drive, operating heavy machinery, perform activities at heights, swimming or participation in water activities or provide baby sitting services if your were admitted for syncope or siezures until you have seen by Primary MD or a Neurologist and advised to do so again.  Do not drive when taking Pain medications.    Do not take more than prescribed Pain, Sleep and Anxiety Medications  Special Instructions: If you have smoked or chewed Tobacco  in the last 2 yrs please stop smoking, stop any regular Alcohol  and or any Recreational drug use.  Wear Seat belts while driving.   Please note  You were cared for by a hospitalist during your hospital stay. If you have any questions about your discharge medications or the care you received while  you were in the hospital after you are discharged, you can call the unit and asked to speak with the hospitalist on call if the hospitalist that took care of you is not available. Once you are discharged, your primary care physician will handle any further medical issues. Please note that NO REFILLS for any discharge medications will be authorized once you are discharged, as it is imperative that you return to your primary care physician (or establish a relationship with a primary care physician if you do not have one) for your aftercare needs so that they can reassess your need for medications and monitor your lab values.  Correction coverage: Moderate (average weight, post-op)   CBG < 70: implement hypoglycemia protocol   CBG 70 - 120: 0 units   CBG 121 - 150: 2 units   CBG 151 - 200: 3 units   CBG 201 - 250: 5 units   CBG 251 - 300: 8 units   CBG 301 - 350: 11 units   CBG 351 - 400: 15 units   CBG > 400 call MD

## 2016-11-11 LAB — CULTURE, BLOOD (ROUTINE X 2)
CULTURE: NO GROWTH
Culture: NO GROWTH

## 2016-11-13 ENCOUNTER — Other Ambulatory Visit: Payer: Self-pay | Admitting: Internal Medicine

## 2016-11-13 DIAGNOSIS — R109 Unspecified abdominal pain: Secondary | ICD-10-CM

## 2016-11-16 ENCOUNTER — Ambulatory Visit
Admission: RE | Admit: 2016-11-16 | Discharge: 2016-11-16 | Disposition: A | Discharge status: Home / Self Care | Payer: Medicaid Other | Source: Ambulatory Visit | Attending: Internal Medicine | Admitting: Internal Medicine

## 2016-11-16 DIAGNOSIS — R109 Unspecified abdominal pain: Secondary | ICD-10-CM

## 2016-11-23 ENCOUNTER — Ambulatory Visit: Admission: RE | Admit: 2016-11-23 | Payer: Medicaid Other | Source: Ambulatory Visit

## 2016-11-27 ENCOUNTER — Ambulatory Visit: Payer: Medicaid Other

## 2016-11-27 ENCOUNTER — Ambulatory Visit
Admission: RE | Admit: 2016-11-27 | Discharge: 2016-11-27 | Disposition: A | Discharge status: Home / Self Care | Payer: Medicaid Other | Source: Ambulatory Visit | Attending: Internal Medicine | Admitting: Internal Medicine

## 2016-11-27 DIAGNOSIS — N281 Cyst of kidney, acquired: Secondary | ICD-10-CM | POA: Diagnosis not present

## 2016-11-27 DIAGNOSIS — R109 Unspecified abdominal pain: Secondary | ICD-10-CM | POA: Diagnosis not present

## 2016-11-27 DIAGNOSIS — R160 Hepatomegaly, not elsewhere classified: Secondary | ICD-10-CM | POA: Insufficient documentation

## 2016-12-13 DIAGNOSIS — S92012G Displaced fracture of body of left calcaneus, subsequent encounter for fracture with delayed healing: Secondary | ICD-10-CM | POA: Insufficient documentation

## 2016-12-21 ENCOUNTER — Ambulatory Visit: Payer: Medicaid Other | Admitting: Gastroenterology

## 2016-12-27 ENCOUNTER — Ambulatory Visit: Payer: Self-pay | Admitting: Gastroenterology

## 2017-01-15 ENCOUNTER — Encounter: Payer: Self-pay | Admitting: Emergency Medicine

## 2017-01-15 ENCOUNTER — Inpatient Hospital Stay
Admission: EM | Admit: 2017-01-15 | Discharge: 2017-01-18 | DRG: 637 | Disposition: A | Discharge status: Home / Self Care | Payer: Medicaid Other | Attending: Internal Medicine | Admitting: Internal Medicine

## 2017-01-15 ENCOUNTER — Emergency Department: Payer: Medicaid Other

## 2017-01-15 DIAGNOSIS — T383X6A Underdosing of insulin and oral hypoglycemic [antidiabetic] drugs, initial encounter: Secondary | ICD-10-CM | POA: Diagnosis present

## 2017-01-15 DIAGNOSIS — X58XXXA Exposure to other specified factors, initial encounter: Secondary | ICD-10-CM | POA: Diagnosis present

## 2017-01-15 DIAGNOSIS — E1140 Type 2 diabetes mellitus with diabetic neuropathy, unspecified: Secondary | ICD-10-CM | POA: Diagnosis present

## 2017-01-15 DIAGNOSIS — R1013 Epigastric pain: Secondary | ICD-10-CM | POA: Diagnosis present

## 2017-01-15 DIAGNOSIS — E43 Unspecified severe protein-calorie malnutrition: Secondary | ICD-10-CM | POA: Diagnosis present

## 2017-01-15 DIAGNOSIS — E1110 Type 2 diabetes mellitus with ketoacidosis without coma: Secondary | ICD-10-CM | POA: Diagnosis not present

## 2017-01-15 DIAGNOSIS — Z8249 Family history of ischemic heart disease and other diseases of the circulatory system: Secondary | ICD-10-CM | POA: Diagnosis not present

## 2017-01-15 DIAGNOSIS — F17210 Nicotine dependence, cigarettes, uncomplicated: Secondary | ICD-10-CM | POA: Diagnosis present

## 2017-01-15 DIAGNOSIS — I10 Essential (primary) hypertension: Secondary | ICD-10-CM | POA: Diagnosis present

## 2017-01-15 DIAGNOSIS — K429 Umbilical hernia without obstruction or gangrene: Secondary | ICD-10-CM | POA: Diagnosis present

## 2017-01-15 DIAGNOSIS — Z6823 Body mass index (BMI) 23.0-23.9, adult: Secondary | ICD-10-CM

## 2017-01-15 DIAGNOSIS — R112 Nausea with vomiting, unspecified: Secondary | ICD-10-CM | POA: Diagnosis present

## 2017-01-15 DIAGNOSIS — E875 Hyperkalemia: Secondary | ICD-10-CM | POA: Diagnosis present

## 2017-01-15 DIAGNOSIS — S92002A Unspecified fracture of left calcaneus, initial encounter for closed fracture: Secondary | ICD-10-CM | POA: Diagnosis present

## 2017-01-15 DIAGNOSIS — Z794 Long term (current) use of insulin: Secondary | ICD-10-CM

## 2017-01-15 DIAGNOSIS — N179 Acute kidney failure, unspecified: Secondary | ICD-10-CM

## 2017-01-15 DIAGNOSIS — Z8679 Personal history of other diseases of the circulatory system: Secondary | ICD-10-CM | POA: Diagnosis not present

## 2017-01-15 DIAGNOSIS — N170 Acute kidney failure with tubular necrosis: Secondary | ICD-10-CM | POA: Diagnosis present

## 2017-01-15 LAB — BASIC METABOLIC PANEL
ANION GAP: 17 — AB (ref 5–15)
ANION GAP: 10 (ref 5–15)
BUN: 29 mg/dL — ABNORMAL HIGH (ref 6–20)
BUN: 27 mg/dL — ABNORMAL HIGH (ref 6–20)
CHLORIDE: 105 mmol/L (ref 101–111)
CHLORIDE: 108 mmol/L (ref 101–111)
CO2: 11 mmol/L — ABNORMAL LOW (ref 22–32)
CO2: 16 mmol/L — ABNORMAL LOW (ref 22–32)
Calcium: 8.4 mg/dL — ABNORMAL LOW (ref 8.9–10.3)
Calcium: 8.5 mg/dL — ABNORMAL LOW (ref 8.9–10.3)
Creatinine, Ser: 1.66 mg/dL — ABNORMAL HIGH (ref 0.61–1.24)
Creatinine, Ser: 1.35 mg/dL — ABNORMAL HIGH (ref 0.61–1.24)
GFR calc non Af Amer: 44 mL/min — ABNORMAL LOW (ref >60)
GFR calc non Af Amer: 57 mL/min — ABNORMAL LOW (ref >60)
GFR, EST AFRICAN AMERICAN: 51 mL/min — AB (ref >60)
Glucose, Bld: 231 mg/dL — ABNORMAL HIGH (ref 65–99)
Glucose, Bld: 125 mg/dL — ABNORMAL HIGH (ref 65–99)
POTASSIUM: 4 mmol/L (ref 3.5–5.1)
POTASSIUM: 3.8 mmol/L (ref 3.5–5.1)
SODIUM: 133 mmol/L — AB (ref 135–145)
SODIUM: 134 mmol/L — AB (ref 135–145)

## 2017-01-15 LAB — COMPREHENSIVE METABOLIC PANEL
ALT: 21 U/L (ref 17–63)
AST: 21 U/L (ref 15–41)
Albumin: 5.1 g/dL — ABNORMAL HIGH (ref 3.5–5.0)
Alkaline Phosphatase: 99 U/L (ref 38–126)
BUN: 31 mg/dL — ABNORMAL HIGH (ref 6–20)
CALCIUM: 9.2 mg/dL (ref 8.9–10.3)
CHLORIDE: 99 mmol/L — AB (ref 101–111)
Creatinine, Ser: 2.02 mg/dL — ABNORMAL HIGH (ref 0.61–1.24)
GFR calc non Af Amer: 35 mL/min — ABNORMAL LOW (ref >60)
GFR, EST AFRICAN AMERICAN: 40 mL/min — AB (ref >60)
Glucose, Bld: 589 mg/dL (ref 65–99)
Potassium: 5.4 mmol/L — ABNORMAL HIGH (ref 3.5–5.1)
SODIUM: 134 mmol/L — AB (ref 135–145)
Total Bilirubin: 1.9 mg/dL — ABNORMAL HIGH (ref 0.3–1.2)
Total Protein: 8.6 g/dL — ABNORMAL HIGH (ref 6.5–8.1)

## 2017-01-15 LAB — CBC
HCT: 56.7 % — ABNORMAL HIGH (ref 40.0–52.0)
HEMOGLOBIN: 18.3 g/dL — AB (ref 13.0–18.0)
MCH: 32 pg (ref 26.0–34.0)
MCHC: 32.3 g/dL (ref 32.0–36.0)
MCV: 99.3 fL (ref 80.0–100.0)
Platelets: 318 10*3/uL (ref 150–440)
RBC: 5.71 MIL/uL (ref 4.40–5.90)
RDW: 14 % (ref 11.5–14.5)
WBC: 15 10*3/uL — ABNORMAL HIGH (ref 3.8–10.6)

## 2017-01-15 LAB — GLUCOSE, CAPILLARY
GLUCOSE-CAPILLARY: 527 mg/dL — AB (ref 65–99)
GLUCOSE-CAPILLARY: 411 mg/dL — AB (ref 65–99)
GLUCOSE-CAPILLARY: 216 mg/dL — AB (ref 65–99)
GLUCOSE-CAPILLARY: 247 mg/dL — AB (ref 65–99)
GLUCOSE-CAPILLARY: 105 mg/dL — AB (ref 65–99)
Glucose-Capillary: 113 mg/dL — ABNORMAL HIGH (ref 65–99)
Glucose-Capillary: 118 mg/dL — ABNORMAL HIGH (ref 65–99)
Glucose-Capillary: 138 mg/dL — ABNORMAL HIGH (ref 65–99)
Glucose-Capillary: 166 mg/dL — ABNORMAL HIGH (ref 65–99)
Glucose-Capillary: 168 mg/dL — ABNORMAL HIGH (ref 65–99)
Glucose-Capillary: 207 mg/dL — ABNORMAL HIGH (ref 65–99)
Glucose-Capillary: 242 mg/dL — ABNORMAL HIGH (ref 65–99)
Glucose-Capillary: 371 mg/dL — ABNORMAL HIGH (ref 65–99)
Glucose-Capillary: 510 mg/dL (ref 65–99)

## 2017-01-15 LAB — BETA-HYDROXYBUTYRIC ACID: Beta-Hydroxybutyric Acid: >8 mmol/L — ABNORMAL HIGH (ref 0.05–0.27)

## 2017-01-15 LAB — LIPASE, BLOOD: LIPASE: 17 U/L (ref 11–51)

## 2017-01-15 MED ORDER — SODIUM CHLORIDE 0.9 % IV BOLUS (SEPSIS)
1000.0000 mL | Freq: Once | INTRAVENOUS | Status: AC
  Administered 2017-01-15: 1000 mL via INTRAVENOUS

## 2017-01-15 MED ORDER — HEPARIN SODIUM (PORCINE) 5000 UNIT/ML IJ SOLN
5000.0000 [IU] | Freq: Three times a day (TID) | INTRAMUSCULAR | Status: DC
Start: 2017-01-15 — End: 2017-01-16
  Administered 2017-01-15 – 2017-01-16 (×3): 5000 [IU] via SUBCUTANEOUS
  Filled 2017-01-15 (×3): qty 1

## 2017-01-15 MED ORDER — ONDANSETRON HCL 4 MG/2ML IJ SOLN: 4.0000 mg | Freq: Four times a day (QID) | INTRAMUSCULAR | Status: DC | PRN

## 2017-01-15 MED ORDER — CHLORHEXIDINE GLUCONATE 0.12 % MT SOLN
15.0000 mL | Freq: Two times a day (BID) | OROMUCOSAL | Status: DC
  Administered 2017-01-16 – 2017-01-18 (×5): 15 mL via OROMUCOSAL
  Filled 2017-01-15 (×5): qty 15

## 2017-01-15 MED ORDER — GABAPENTIN 300 MG PO CAPS
600.0000 mg | ORAL_CAPSULE | Freq: Three times a day (TID) | ORAL | Status: DC
  Administered 2017-01-15 – 2017-01-18 (×9): 600 mg via ORAL
  Filled 2017-01-15 (×10): qty 2

## 2017-01-15 MED ORDER — OXYCODONE HCL 5 MG PO TABS: 5.0000 mg | ORAL_TABLET | ORAL | Status: DC | PRN

## 2017-01-15 MED ORDER — POTASSIUM CHLORIDE CRYS ER 20 MEQ PO TBCR
40.0000 meq | EXTENDED_RELEASE_TABLET | Freq: Once | ORAL | Status: AC
  Administered 2017-01-15: 40 meq via ORAL
  Filled 2017-01-15: qty 2

## 2017-01-15 MED ORDER — MORPHINE SULFATE (PF) 4 MG/ML IV SOLN: 2.0000 mg | INTRAVENOUS | Status: DC | PRN

## 2017-01-15 MED ORDER — STERILE WATER FOR INJECTION IJ SOLN
INTRAMUSCULAR | Status: AC
  Administered 2017-01-15: 20 mL
  Filled 2017-01-15: qty 10

## 2017-01-15 MED ORDER — ORAL CARE MOUTH RINSE
15.0000 mL | Freq: Two times a day (BID) | OROMUCOSAL | Status: DC
  Administered 2017-01-16 – 2017-01-17 (×2): 15 mL via OROMUCOSAL

## 2017-01-15 MED ORDER — ONDANSETRON HCL 4 MG PO TABS: 4.0000 mg | ORAL_TABLET | Freq: Four times a day (QID) | ORAL | Status: DC | PRN

## 2017-01-15 MED ORDER — DEXTROSE-NACL 5-0.45 % IV SOLN
INTRAVENOUS | Status: DC
  Administered 2017-01-15: 18:00:00 via INTRAVENOUS

## 2017-01-15 MED ORDER — ATORVASTATIN CALCIUM 10 MG PO TABS
10.0000 mg | ORAL_TABLET | Freq: Every day | ORAL | Status: DC
  Administered 2017-01-15 – 2017-01-16 (×2): 10 mg via ORAL
  Filled 2017-01-15 (×2): qty 1

## 2017-01-15 MED ORDER — PANTOPRAZOLE SODIUM 40 MG IV SOLR
40.0000 mg | Freq: Two times a day (BID) | INTRAVENOUS | Status: DC
  Administered 2017-01-15 – 2017-01-16 (×3): 40 mg via INTRAVENOUS
  Filled 2017-01-15 (×3): qty 40

## 2017-01-15 MED ORDER — LISINOPRIL 20 MG PO TABS
20.0000 mg | ORAL_TABLET | Freq: Every day | ORAL | Status: DC
  Administered 2017-01-15 – 2017-01-18 (×4): 20 mg via ORAL
  Filled 2017-01-15 (×5): qty 1

## 2017-01-15 MED ORDER — HYDRALAZINE HCL 20 MG/ML IJ SOLN: 10.0000 mg | Freq: Four times a day (QID) | INTRAMUSCULAR | Status: DC | PRN

## 2017-01-15 MED ORDER — SODIUM CHLORIDE 0.9 % IV SOLN
INTRAVENOUS | Status: DC
  Administered 2017-01-15: 15:00:00 via INTRAVENOUS

## 2017-01-15 MED ORDER — ONDANSETRON HCL 4 MG/2ML IJ SOLN
4.0000 mg | Freq: Once | INTRAMUSCULAR | Status: AC
  Administered 2017-01-15: 4 mg via INTRAVENOUS
  Filled 2017-01-15: qty 2

## 2017-01-15 MED ORDER — ACETAMINOPHEN 325 MG PO TABS: 650.0000 mg | ORAL_TABLET | Freq: Four times a day (QID) | ORAL | Status: DC | PRN

## 2017-01-15 MED ORDER — SODIUM CHLORIDE 0.9 % IV SOLN
INTRAVENOUS | Status: DC
  Administered 2017-01-15: 7.6 [IU]/h via INTRAVENOUS
  Administered 2017-01-15: 4.7 [IU]/h via INTRAVENOUS
  Administered 2017-01-16: 7.1 [IU]/h via INTRAVENOUS
  Administered 2017-01-16: 7 [IU]/h via INTRAVENOUS
  Filled 2017-01-15: qty 2.5

## 2017-01-15 MED ORDER — ACETAMINOPHEN 650 MG RE SUPP: 650.0000 mg | Freq: Four times a day (QID) | RECTAL | Status: DC | PRN

## 2017-01-15 NOTE — Progress Notes (Signed)
MEDICATION RELATED CONSULT NOTE  Pharmacy Consult for electrolyte management  Indication: Insulin Drip secondary to DKA    Pharmacy consulted for electrolyte management for 56 yo male admitted to ICU for DKA and requiring insulin drip.    Plan:  No replacement warranted at this time. BMPs are scheduled q6hr. Will continue to monitor and replace to maintain potassium > 4 while patient is requiring insulin drip therapy.    No Known Allergies  Patient Measurements: Height: 6\' 1"  (185.4 cm) Weight: 180 lb (81.6 kg) IBW/kg (Calculated) : 79.9   Vital Signs: Temp: 98.3 F (36.8 C) (03/12 1315) Temp Source: Oral (03/12 1315) BP: 105/62 (03/12 1800) Pulse Rate: 91 (03/12 1800) Intake/Output from previous day: No intake/output data recorded. Intake/Output from this shift: Total I/O In: 2058.7 [I.V.:58.7; IV Piggyback:2000] Out: -   Labs:  Recent Labs  01/15/17 0803 01/15/17 1505  WBC 15.0*  --   HGB 18.3*  --   HCT 56.7*  --   PLT 318  --   CREATININE 2.02* 1.66*  ALBUMIN 5.1*  --   PROT 8.6*  --   AST 21  --   ALT 21  --   ALKPHOS 99  --   BILITOT 1.9*  --    Estimated Creatinine Clearance: 55.5 mL/min (by C-G formula based on SCr of 1.66 mg/dL (H)).   Microbiology: No results found for this or any previous visit (from the past 720 hour(s)).  Medical History: Past Medical History:  Diagnosis Date  . Brain aneurysm   . Diabetes mellitus without complication (HCC)   . Hypertension     Pharmacy will continue to monitor and adjust per consult.    Simpson,Michael L 01/15/2017,6:36 PM

## 2017-01-15 NOTE — ED Notes (Signed)
AAOx3.  Skin warm and dry.  NAD 

## 2017-01-15 NOTE — ED Provider Notes (Signed)
[facility] Emergency Department Provider Note  ____________________________________________   None    (approximate)  I have reviewed the triage vital signs and the nursing notes.   HISTORY  Chief Complaint Abdominal Pain; Shortness of Breath; and Emesis    HPI Spencer [name] is a 56 y.o. male comes to the emergency department with several days of shortness of breath, diffuse abdominal discomfort, and vomiting. He is also reported polyuria and polydipsia. He is a type II diabetic who is insulin-dependent and he reports compliance with his insulin. He has been in DKA in the past. He has no past medical history of abdominal surgery. He's had normal bowel movement and flatus. He's had no fevers or chills. He's had no cough. He has moderate severity epigastric pain that is searing constant and nonradiating. Nothing makes it better or worse.   Past Medical History:  Diagnosis Date  . Brain aneurysm   . Diabetes mellitus without complication (HCC)   . Hypertension     Patient Active Problem List   Diagnosis Date Noted  . Protein-calorie malnutrition, severe 11/08/2016  . DKA, type 2 (HCC) 11/06/2016  . Diabetic ketoacidosis (HCC) 04/03/2015  . DKA (diabetic ketoacidoses) (HCC) 04/03/2015  . Hypertension 04/03/2015  . Depression 04/03/2015  . Opiate dependence (HCC) 04/03/2015  . Tobacco abuse 04/03/2015    Past Surgical History:  Procedure Laterality Date  . HERNIA REPAIR    . NECK SURGERY      Prior to Admission medications   Medication Sig Start Date End Date Taking? Authorizing Provider  feeding supplement, GLUCERNA SHAKE, (GLUCERNA SHAKE) LIQD Take 237 mLs by mouth 3 (three) times daily between meals. 11/09/16   Leana Roe Elgergawy, MD  insulin aspart (NOVOLOG) 100 UNIT/ML FlexPen Inject 10 Units into the skin 3 (three) times daily with meals. Patient taking differently: Inject 35 Units into the skin 3 (three) times daily with meals.  04/06/15    Marinda Elk, MD  insulin glargine (LANTUS) 100 UNIT/ML injection Inject 0.35 mLs (35 Units total) into the skin 2 (two) times daily. 11/09/16   Starleen Arms, MD    Allergies Patient has no known allergies.  Family History  Problem Relation Age of Onset  . CAD Sister   . Hypertension Sister     Social History Social History  Substance Use Topics  . Smoking status: Current Every Day Smoker    Packs/day: 0.50    Years: 34.00    Types: Cigarettes  . Smokeless tobacco: Never Used  . Alcohol use 0.6 oz/week    1 Cans of beer per week    Review of Systems Constitutional: No fever/chills Eyes: No visual changes. ENT: No sore throat. Cardiovascular: Denies chest pain. Respiratory: Positive shortness of breath. Gastrointestinal: Positive abdominal pain.  Positive nausea, positive vomiting.  No diarrhea.  No constipation. Genitourinary: Negative for dysuria. Musculoskeletal: Negative for back pain. Skin: Negative for rash. Neurological: Negative for headaches, focal weakness or numbness.  10-point ROS otherwise negative.  ____________________________________________   PHYSICAL EXAM:  VITAL SIGNS: ED Triage Vitals  Enc Vitals Group     BP 01/15/17 0750 (!) 157/97     Pulse Rate 01/15/17 0750 (!) 103     Resp --      Temp --      Temp src --      SpO2 01/15/17 0750 100 %     Weight 01/15/17 0750 180 lb (81.6 kg)     Height 01/15/17 0750 6'  1" (1.854 m)     Head Circumference --      Peak Flow --      Pain Score 01/15/17 0751 10     Pain Loc --      Pain Edu? --      Excl. in GC? --     Constitutional: Alert and oriented x 4 Elevated respiratory rate appears uncomfortable heavy ketones on breath Eyes: PERRL EOMI. Head: Atraumatic. Nose: No congestion/rhinnorhea. Mouth/Throat: No trismus Neck: No stridor.   Cardiovascular: Tachycardic rate, regular rhythm. Grossly normal heart sounds.  Good peripheral circulation. Respiratory: Elevated respiratory  rate with mild accessory muscle use clear to auscultation bilaterally and moving good air Gastrointestinal: Soft nondistended nontender no rebound no guarding no peritonitis small umbilical hernia with no skin changes easily reduced Musculoskeletal: No lower extremity edema   Neurologic:  Normal speech and language. No gross focal neurologic deficits are appreciated. Skin:  Skin is warm, dry and intact. No rash noted. Psychiatric: Mood and affect are normal. Speech and behavior are normal.  ____________________________________________   LABS (all labs ordered are listed, but only abnormal results are displayed)  Labs Reviewed  COMPREHENSIVE METABOLIC PANEL - Abnormal; Notable for the following:       Result Value   Sodium 134 (*)    Potassium 5.4 (*)    Chloride 99 (*)    CO2 <7 (*)    Glucose, Bld 589 (*)    BUN 31 (*)    Creatinine, Ser 2.02 (*)    Total Protein 8.6 (*)    Albumin 5.1 (*)    Total Bilirubin 1.9 (*)    GFR calc non Af Amer 35 (*)    GFR calc Af Amer 40 (*)    All other components within normal limits  CBC - Abnormal; Notable for the following:    WBC 15.0 (*)    Hemoglobin 18.3 (*)    HCT 56.7 (*)    All other components within normal limits  BLOOD GAS, VENOUS - Abnormal; Notable for the following:    pH, Ven 6.90 (*)    pCO2, Ven 29 (*)    Bicarbonate 5.7 (*)    Acid-base deficit 26.5 (*)    All other components within normal limits  LIPASE, BLOOD  URINALYSIS, COMPLETE (UACMP) WITH MICROSCOPIC  BETA-HYDROXYBUTYRIC ACID   ______PH of 6.90 with an unmeasurable bicarbonate and an anion gap of 35 in the setting of uncontrolled diabetes with hyperglycemia is most consistent with diabetic ketoacidosis ______________________________________  EKG  ED ECG REPORT I, Merrily BrittleNeil Jaycey Gens, the attending physician, personally viewed and interpreted this ECG.  Date: 01/15/2017 Rate: 103 Rhythm: Sinus tachycardia QRS Axis: normal Intervals: normal ST/T Wave  abnormalities: normal Conduction Disturbances: none Narrative Interpretation: unremarkable  ____________________________________________  RADIOLOGY  Chest x-ray with no acute disease   PROCEDURES  Procedure(s) performed: no  Procedures  Critical Care performed: yes  CRITICAL CARE Performed by: Merrily BrittleNeil Hao Dion   Total critical care time: 32 minutes  Critical care time was exclusive of separately billable procedures and treating other patients.  Critical care was necessary to treat or prevent imminent or life-threatening deterioration.  Critical care was time spent personally by me on the following activities: development of treatment plan with patient and/or surrogate as well as nursing, discussions with consultants, evaluation of patient's response to treatment, examination of patient, obtaining history from patient or surrogate, ordering and performing treatments and interventions, ordering and review of laboratory studies, ordering and review of radiographic studies,  pulse oximetry and re-evaluation of patient's condition.   ____________________________________________   INITIAL IMPRESSION / ASSESSMENT AND PLAN / ED COURSE  Pertinent labs & imaging results that were available during my care of the patient were reviewed by me and considered in my medical decision making (see chart for details).  On arrival the patient is to, tachycardic, and uncomfortable appearing with heavy ketones on his breath with hyperglycemia to 589. Epigastric pain nausea vomiting and hyperglycemia is most concerning for diabetic ketoacidosis. His anion gap is 35 and his bicarbonate is unmeasurable low. His pH is 6.9. Fortunately his arrival EKG shows no signs of hyperkalemia and his first K is 5.3 so we can go ahead and start him on an insulin drip. He requires inpatient admission and I discussed the case with the hospitalist who is graciously agreed to admit the patient to his service.       ____________________________________________   FINAL CLINICAL IMPRESSION(S) / ED DIAGNOSES  Final diagnoses:  Diabetic ketoacidosis without coma associated with type 2 diabetes mellitus (HCC)      NEW MEDICATIONS STARTED DURING THIS VISIT:  New Prescriptions   No medications on file     Note:  This document was prepared using Dragon voice recognition software and may include unintentional dictation errors.     Merrily Brittle, MD 01/15/17 [phone]

## 2017-01-15 NOTE — ED Triage Notes (Signed)
Pt with N/V abdominal pain, SOB, since Thursday. States unable to eat x 2 days. Emesis x 3 today.

## 2017-01-15 NOTE — Progress Notes (Signed)
MEDICATION RELATED CONSULT NOTE  Pharmacy Consult for electrolyte management  Indication: Insulin Drip secondary to DKA    Pharmacy consulted for electrolyte management for 56 yo male admitted to ICU for DKA and requiring insulin drip.    Plan:  Patient remains on insulin drip. Will order KCl 40 meq po once for K < 4 and f/u next BMET.   No Known Allergies  Patient Measurements: Height: 6\' 1"  (185.4 cm) Weight: 180 lb (81.6 kg) IBW/kg (Calculated) : 79.9   Vital Signs: Temp: 97.6 F (36.4 C) (03/12 2000) Temp Source: Oral (03/12 2000) BP: 102/66 (03/12 2200) Pulse Rate: 75 (03/12 2200) Intake/Output from previous day: No intake/output data recorded. Intake/Output from this shift: Total I/O In: 605.9 [I.V.:605.9] Out: -   Labs:  Recent Labs  01/15/17 0803 01/15/17 1505 01/15/17 1956  WBC 15.0*  --   --   HGB 18.3*  --   --   HCT 56.7*  --   --   PLT 318  --   --   CREATININE 2.02* 1.66* 1.35*  ALBUMIN 5.1*  --   --   PROT 8.6*  --   --   AST 21  --   --   ALT 21  --   --   ALKPHOS 99  --   --   BILITOT 1.9*  --   --    Estimated Creatinine Clearance: 68.2 mL/min (by C-G formula based on SCr of 1.35 mg/dL (H)).  Potassium  Date Value Ref Range Status  01/15/2017 3.8 3.5 - 5.1 mmol/L Final     Microbiology: No results found for this or any previous visit (from the past 720 hour(s)).  Medical History: Past Medical History:  Diagnosis Date  . Brain aneurysm   . Diabetes mellitus without complication (HCC)   . Hypertension     Pharmacy will continue to monitor and adjust per consult.    Luisa Harthristy, Klark Vanderhoef D 01/15/2017,10:36 PM

## 2017-01-15 NOTE — ED Notes (Signed)
Pt to sub-wait. In recliner, call bell in reach.

## 2017-01-15 NOTE — H&P (Signed)
[facility] [place] [facility]   PATIENT NAME: Spencer [name]    MR#:  [id number]  DATE OF BIRTH:  January 25, 1961  DATE OF ADMISSION:  01/15/2017  PRIMARY CARE PHYSICIAN: Dr. Desma Maxim  REQUESTING/REFERRING PHYSICIAN: Dr. Merrily Brittle  CHIEF COMPLAINT:   Chief Complaint  Patient presents with  . Abdominal Pain  . Shortness of Breath  . Emesis    HISTORY OF PRESENT ILLNESS:  Spencer [name]  is a 56 y.o. male with a known history of Type 2 diabetes mellitus insulin-dependent, hypertension, neuropathy presents to hospital secondary to worsening abdominal pain, polyuria and polydipsia. Patient states he has had been admitted to the hospital for ketoacidosis in the past. He states that he has been compliant with his insulin. For 3 days now his complaints started in the form of epigastric pain with nausea vomiting. Feeling too full after eating. He had intense heartburn and started throwing up. Symptoms have gotten worse in the last 2 days. Presents to the emergency room today with anion gap greater than 30, sugars in the 500s. Noted to be in renal failure and also with hyperkalemia. His blood pressure is elevated. He is being admitted for DKA. Denies any fevers or chills. No other complaints.  PAST MEDICAL HISTORY:   Past Medical History:  Diagnosis Date  . Brain aneurysm   . Diabetes mellitus without complication (HCC)   . Hypertension     PAST SURGICAL HISTORY:   Past Surgical History:  Procedure Laterality Date  . HERNIA REPAIR    . NECK SURGERY      SOCIAL HISTORY:   Social History  Substance Use Topics  . Smoking status: Current Every Day Smoker    Packs/day: 0.50    Years: 34.00    Types: Cigarettes  . Smokeless tobacco: Never Used  . Alcohol use 0.6 oz/week    1 Cans of beer per week    FAMILY HISTORY:   Family History  Problem Relation Age of Onset  . CAD Sister   . Hypertension Sister     DRUG ALLERGIES:  No Known  Allergies  REVIEW OF SYSTEMS:   Review of Systems  Constitutional: Positive for malaise/fatigue. Negative for chills, fever and weight loss.  HENT: Negative for ear discharge, ear pain, hearing loss and nosebleeds.   Eyes: Negative for blurred vision, double vision and photophobia.  Respiratory: Positive for shortness of breath. Negative for cough, hemoptysis and wheezing.   Cardiovascular: Negative for chest pain, palpitations, orthopnea and leg swelling.  Gastrointestinal: Positive for abdominal pain, heartburn, nausea and vomiting. Negative for constipation, diarrhea and melena.  Genitourinary: Positive for frequency. Negative for dysuria and urgency.  Musculoskeletal: Positive for myalgias. Negative for neck pain.  Skin: Negative for rash.  Neurological: Negative for dizziness, sensory change, speech change, focal weakness and headaches.  Endo/Heme/Allergies: Does not bruise/bleed easily.  Psychiatric/Behavioral: Negative for depression.    MEDICATIONS AT HOME:   Prior to Admission medications   Medication Sig Start Date End Date Taking? Authorizing Provider  atorvastatin (LIPITOR) 10 MG tablet Take 10 mg by mouth daily.   Yes Historical Provider, MD  gabapentin (NEURONTIN) 300 MG capsule Take 600 mg by mouth 3 (three) times daily. 01/03/17  Yes Historical Provider, MD  ibuprofen (ADVIL,MOTRIN) 800 MG tablet Take 800 mg by mouth 3 (three) times daily as needed. 12/07/16  Yes Historical Provider, MD  insulin aspart (NOVOLOG) 100 UNIT/ML FlexPen Inject 10 Units into the skin 3 (three) times daily with  meals. Patient taking differently: Inject 15 Units into the skin 3 (three) times daily with meals. Per sliding scale 04/06/15  Yes Marinda ElkAbraham Feliz Ortiz, MD  insulin glargine (LANTUS) 100 UNIT/ML injection Inject 0.35 mLs (35 Units total) into the skin 2 (two) times daily. Patient taking differently: Inject 45 Units into the skin daily.  11/09/16  Yes Leana Roeawood S Elgergawy, MD  lisinopril  (PRINIVIL,ZESTRIL) 20 MG tablet Take 20 mg by mouth daily. 12/12/16 12/06/17 Yes Historical Provider, MD  metFORMIN (GLUCOPHAGE-XR) 500 MG 24 hr tablet Take 1,000 mg by mouth every evening. 12/12/16 12/12/17 Yes Historical Provider, MD  pantoprazole (PROTONIX) 40 MG tablet Take 40 mg by mouth daily.   Yes Historical Provider, MD  feeding supplement, GLUCERNA SHAKE, (GLUCERNA SHAKE) LIQD Take 237 mLs by mouth 3 (three) times daily between meals. 11/09/16   Leana Roeawood S Elgergawy, MD      VITAL SIGNS:  Blood pressure (!) 165/95, pulse 95, resp. rate (!) 26, height 6\' 1"  (1.854 m), weight 81.6 kg (180 lb), SpO2 100 %.  PHYSICAL EXAMINATION:   Physical Exam  GENERAL:  56 y.o.-year-old patient lying in the bed with no acute distress. Appears tachypneic EYES: Pupils equal, round, reactive to light and accommodation. No scleral icterus. Extraocular muscles intact.  HEENT: Head atraumatic, normocephalic. Oropharynx and nasopharynx clear.  NECK:  Supple, no jugular venous distention. No thyroid enlargement, no tenderness.  LUNGS: Normal breath sounds bilaterally, no wheezing, rales,rhonchi or crepitation. No use of accessory muscles of respiration.  CARDIOVASCULAR: S1, S2 normal. No murmurs, rubs, or gallops.  ABDOMEN: Soft, tender in epigastric region, nondistended. Bowel sounds present. No organomegaly or mass.  EXTREMITIES: No pedal edema, cyanosis, or clubbing. Left foot in a post op shoe NEUROLOGIC: Cranial nerves II through XII are intact. Muscle strength 5/5 in all extremities. Sensation intact. Gait not checked.  PSYCHIATRIC: The patient is alert and oriented x 3.  SKIN: No obvious rash, lesion, or ulcer.   LABORATORY PANEL:   CBC  Recent Labs Lab 01/15/17 0803  WBC 15.0*  HGB 18.3*  HCT 56.7*  PLT 318   ------------------------------------------------------------------------------------------------------------------  Chemistries   Recent Labs Lab 01/15/17 0803  NA 134*  K 5.4*  CL  99*  CO2 <7*  GLUCOSE 589*  BUN 31*  CREATININE 2.02*  CALCIUM 9.2  AST 21  ALT 21  ALKPHOS 99  BILITOT 1.9*   ------------------------------------------------------------------------------------------------------------------  Cardiac Enzymes No results for input(s): TROPONINI in the last 168 hours. ------------------------------------------------------------------------------------------------------------------  RADIOLOGY:  Dg Chest 2 View  Result Date: 01/15/2017 CLINICAL DATA:  Shortness of breath for 4 days EXAM: CHEST  2 VIEW COMPARISON:  November 06, 2016 FINDINGS: There is no edema or consolidation. Heart size and pulmonary vascularity are normal. No adenopathy. There arm metallic fragments on the right. There is postoperative change in the lower cervical spine. IMPRESSION: No edema or consolidation. Electronically Signed   By: Bretta BangWilliam  Woodruff III M.D.   On: 01/15/2017 08:43    EKG:   Orders placed or performed during the hospital encounter of 01/15/17  . ED EKG  . ED EKG  . EKG 12-Lead  . EKG 12-Lead    IMPRESSION AND PLAN:   Spencer [name]  is a 56 y.o. male with a known history of Type 2 diabetes mellitus insulin-dependent, hypertension, neuropathy presents to hospital secondary to worsening abdominal pain, polyuria and polydipsia.  #1 DKA- admit to step down - IV fluids, insulin drip, repeat BMP q4h until gap closes - diabetes coordinator consult -  check a1c  #2 HTN- lisinopril, IV hydralazine prn  #3 ARF- ATN, IV fluids and monitor Hold nephrotoxins  #4 Diabetic neuropathy- on gabapentin  #5 Left heel fracture- follow up at [facility] and continue post op shoe  #6 Hyperkalemia- secondary to severe metabolic acidosis, monitor with fluids- change fluids to bicarb  #7 DVT Prophylaxis- SQ heparin   All the records are reviewed and case discussed with ED provider. Management plans discussed with the patient, family and they are in agreement.  CODE STATUS: Full  Code  TOTAL CRITICAL CARE TIME SPENT IN TAKING CARE OF THIS PATIENT: 55 minutes.    Enid Baas M.D on 01/15/2017 at 12:25 PM  Between 7am to 6pm - Pager - [phone]  After 6pm go to [email or web address] - password [email or web address]  [facility]  Office  [phone]  CC: Primary care physician; PROVIDER NOT IN SYSTEM

## 2017-01-16 ENCOUNTER — Ambulatory Visit: Payer: Medicaid Other | Admitting: Gastroenterology

## 2017-01-16 ENCOUNTER — Other Ambulatory Visit: Payer: Self-pay

## 2017-01-16 DIAGNOSIS — M542 Cervicalgia: Secondary | ICD-10-CM

## 2017-01-16 DIAGNOSIS — G8929 Other chronic pain: Secondary | ICD-10-CM | POA: Insufficient documentation

## 2017-01-16 DIAGNOSIS — E1140 Type 2 diabetes mellitus with diabetic neuropathy, unspecified: Secondary | ICD-10-CM | POA: Insufficient documentation

## 2017-01-16 DIAGNOSIS — E119 Type 2 diabetes mellitus without complications: Secondary | ICD-10-CM | POA: Insufficient documentation

## 2017-01-16 DIAGNOSIS — Z8679 Personal history of other diseases of the circulatory system: Secondary | ICD-10-CM | POA: Insufficient documentation

## 2017-01-16 DIAGNOSIS — F1411 Cocaine abuse, in remission: Secondary | ICD-10-CM | POA: Insufficient documentation

## 2017-01-16 DIAGNOSIS — M545 Low back pain: Secondary | ICD-10-CM

## 2017-01-16 LAB — BASIC METABOLIC PANEL
ANION GAP: 4 — AB (ref 5–15)
ANION GAP: 5 (ref 5–15)
Anion gap: 8 (ref 5–15)
Anion gap: 4 — ABNORMAL LOW (ref 5–15)
Anion gap: 4 — ABNORMAL LOW (ref 5–15)
BUN: 28 mg/dL — AB (ref 6–20)
BUN: 26 mg/dL — AB (ref 6–20)
BUN: 25 mg/dL — ABNORMAL HIGH (ref 6–20)
BUN: 23 mg/dL — ABNORMAL HIGH (ref 6–20)
BUN: 22 mg/dL — ABNORMAL HIGH (ref 6–20)
CALCIUM: 8.1 mg/dL — AB (ref 8.9–10.3)
CALCIUM: 8.2 mg/dL — AB (ref 8.9–10.3)
CALCIUM: 8.3 mg/dL — AB (ref 8.9–10.3)
CALCIUM: 8.4 mg/dL — AB (ref 8.9–10.3)
CALCIUM: 8.7 mg/dL — AB (ref 8.9–10.3)
CO2: 15 mmol/L — AB (ref 22–32)
CO2: 17 mmol/L — AB (ref 22–32)
CO2: 16 mmol/L — AB (ref 22–32)
CO2: 18 mmol/L — AB (ref 22–32)
CO2: 19 mmol/L — AB (ref 22–32)
CREATININE: 1.25 mg/dL — AB (ref 0.61–1.24)
CREATININE: 1.04 mg/dL (ref 0.61–1.24)
CREATININE: 0.91 mg/dL (ref 0.61–1.24)
Chloride: 108 mmol/L (ref 101–111)
Chloride: 114 mmol/L — ABNORMAL HIGH (ref 101–111)
Chloride: 110 mmol/L (ref 101–111)
Chloride: 111 mmol/L (ref 101–111)
Chloride: 110 mmol/L (ref 101–111)
Creatinine, Ser: 1.09 mg/dL (ref 0.61–1.24)
Creatinine, Ser: 0.84 mg/dL (ref 0.61–1.24)
GFR calc Af Amer: >60 mL/min (ref >60)
GFR calc Af Amer: >60 mL/min (ref >60)
GFR calc Af Amer: >60 mL/min (ref >60)
GFR calc non Af Amer: >60 mL/min (ref >60)
GFR calc non Af Amer: >60 mL/min (ref >60)
GFR calc non Af Amer: >60 mL/min (ref >60)
GFR calc non Af Amer: >60 mL/min (ref >60)
GFR calc non Af Amer: >60 mL/min (ref >60)
GLUCOSE: 291 mg/dL — AB (ref 65–99)
GLUCOSE: 154 mg/dL — AB (ref 65–99)
GLUCOSE: 239 mg/dL — AB (ref 65–99)
GLUCOSE: 177 mg/dL — AB (ref 65–99)
Glucose, Bld: 145 mg/dL — ABNORMAL HIGH (ref 65–99)
Potassium: 3.9 mmol/L (ref 3.5–5.1)
Potassium: 3.7 mmol/L (ref 3.5–5.1)
Potassium: 4.4 mmol/L (ref 3.5–5.1)
Potassium: 3.5 mmol/L (ref 3.5–5.1)
Potassium: 4.3 mmol/L (ref 3.5–5.1)
SODIUM: 134 mmol/L — AB (ref 135–145)
Sodium: 131 mmol/L — ABNORMAL LOW (ref 135–145)
Sodium: 135 mmol/L (ref 135–145)
Sodium: 130 mmol/L — ABNORMAL LOW (ref 135–145)
Sodium: 133 mmol/L — ABNORMAL LOW (ref 135–145)

## 2017-01-16 LAB — GLUCOSE, CAPILLARY
GLUCOSE-CAPILLARY: 140 mg/dL — AB (ref 65–99)
GLUCOSE-CAPILLARY: 114 mg/dL — AB (ref 65–99)
GLUCOSE-CAPILLARY: 196 mg/dL — AB (ref 65–99)
GLUCOSE-CAPILLARY: 222 mg/dL — AB (ref 65–99)
GLUCOSE-CAPILLARY: 179 mg/dL — AB (ref 65–99)
GLUCOSE-CAPILLARY: 75 mg/dL (ref 65–99)
GLUCOSE-CAPILLARY: 162 mg/dL — AB (ref 65–99)
GLUCOSE-CAPILLARY: 189 mg/dL — AB (ref 65–99)
Glucose-Capillary: 126 mg/dL — ABNORMAL HIGH (ref 65–99)
Glucose-Capillary: 135 mg/dL — ABNORMAL HIGH (ref 65–99)
Glucose-Capillary: 155 mg/dL — ABNORMAL HIGH (ref 65–99)
Glucose-Capillary: 172 mg/dL — ABNORMAL HIGH (ref 65–99)
Glucose-Capillary: 180 mg/dL — ABNORMAL HIGH (ref 65–99)
Glucose-Capillary: 183 mg/dL — ABNORMAL HIGH (ref 65–99)
Glucose-Capillary: 216 mg/dL — ABNORMAL HIGH (ref 65–99)
Glucose-Capillary: 220 mg/dL — ABNORMAL HIGH (ref 65–99)
Glucose-Capillary: 258 mg/dL — ABNORMAL HIGH (ref 65–99)
Glucose-Capillary: 280 mg/dL — ABNORMAL HIGH (ref 65–99)

## 2017-01-16 LAB — HIV ANTIBODY (ROUTINE TESTING W REFLEX): HIV Screen 4th Generation wRfx: NONREACTIVE

## 2017-01-16 LAB — HEMOGLOBIN A1C
Hgb A1c MFr Bld: 12.6 % — ABNORMAL HIGH (ref 4.8–5.6)
MEAN PLASMA GLUCOSE: 315 mg/dL

## 2017-01-16 LAB — URINALYSIS, COMPLETE (UACMP) WITH MICROSCOPIC
BACTERIA UA: NONE SEEN
Bilirubin Urine: NEGATIVE
Hgb urine dipstick: NEGATIVE
KETONES UR: 80 mg/dL — AB
Leukocytes, UA: NEGATIVE
Nitrite: NEGATIVE
PROTEIN: NEGATIVE mg/dL
Specific Gravity, Urine: 1.022 (ref 1.005–1.030)
pH: 6 (ref 5.0–8.0)

## 2017-01-16 LAB — CBC
HEMATOCRIT: 38.4 % — AB (ref 40.0–52.0)
HEMOGLOBIN: 13.4 g/dL (ref 13.0–18.0)
MCH: 32.4 pg (ref 26.0–34.0)
MCHC: 35 g/dL (ref 32.0–36.0)
MCV: 92.6 fL (ref 80.0–100.0)
Platelets: 194 10*3/uL (ref 150–440)
RBC: 4.15 MIL/uL — AB (ref 4.40–5.90)
RDW: 13.3 % (ref 11.5–14.5)
WBC: 8 10*3/uL (ref 3.8–10.6)

## 2017-01-16 LAB — MRSA PCR SCREENING: MRSA BY PCR: NEGATIVE

## 2017-01-16 LAB — BLOOD GAS, VENOUS
ACID-BASE DEFICIT: 26.5 mmol/L — AB (ref 0.0–2.0)
Bicarbonate: 5.7 mmol/L — ABNORMAL LOW (ref 20.0–28.0)
Patient temperature: 37
pCO2, Ven: 29 mmHg — ABNORMAL LOW (ref 44.0–60.0)
pH, Ven: 6.9 — CL (ref 7.250–7.430)

## 2017-01-16 MED ORDER — STERILE WATER FOR INJECTION IV SOLN
INTRAVENOUS | Status: AC
  Administered 2017-01-16: 10:00:00 via INTRAVENOUS
  Filled 2017-01-16 (×2): qty 850

## 2017-01-16 MED ORDER — LIVING WELL WITH DIABETES BOOK
Freq: Once | Status: AC
  Administered 2017-01-16: 15:00:00
  Filled 2017-01-16: qty 1

## 2017-01-16 MED ORDER — SODIUM CHLORIDE 0.9 % IV SOLN
INTRAVENOUS | Status: DC
  Administered 2017-01-16: 03:00:00 via INTRAVENOUS

## 2017-01-16 MED ORDER — DEXTROSE-NACL 5-0.45 % IV SOLN
INTRAVENOUS | Status: DC
  Administered 2017-01-16: 04:00:00 via INTRAVENOUS

## 2017-01-16 MED ORDER — PANTOPRAZOLE SODIUM 40 MG PO TBEC
40.0000 mg | DELAYED_RELEASE_TABLET | Freq: Two times a day (BID) | ORAL | Status: DC
  Administered 2017-01-16 – 2017-01-18 (×4): 40 mg via ORAL
  Filled 2017-01-16 (×4): qty 1

## 2017-01-16 MED ORDER — INSULIN ASPART 100 UNIT/ML ~~LOC~~ SOLN
0.0000 [IU] | Freq: Three times a day (TID) | SUBCUTANEOUS | Status: DC
Start: 2017-01-16 — End: 2017-01-18
  Administered 2017-01-16 – 2017-01-17 (×2): 5 [IU] via SUBCUTANEOUS
  Administered 2017-01-17: 1 [IU] via SUBCUTANEOUS
  Administered 2017-01-17: 2 [IU] via SUBCUTANEOUS
  Administered 2017-01-18: 1 [IU] via SUBCUTANEOUS
  Filled 2017-01-16: qty 1
  Filled 2017-01-16: qty 5
  Filled 2017-01-16 (×2): qty 2
  Filled 2017-01-16: qty 1

## 2017-01-16 MED ORDER — INSULIN GLARGINE 100 UNIT/ML ~~LOC~~ SOLN
45.0000 [IU] | Freq: Every day | SUBCUTANEOUS | Status: DC
  Administered 2017-01-16 – 2017-01-17 (×2): 45 [IU] via SUBCUTANEOUS
  Filled 2017-01-16 (×2): qty 0.45

## 2017-01-16 MED ORDER — STERILE WATER FOR INJECTION IJ SOLN
INTRAMUSCULAR | Status: AC
  Administered 2017-01-16: 10:00:00
  Filled 2017-01-16: qty 10

## 2017-01-16 MED ORDER — POTASSIUM CHLORIDE CRYS ER 20 MEQ PO TBCR
40.0000 meq | EXTENDED_RELEASE_TABLET | Freq: Once | ORAL | Status: AC
  Administered 2017-01-16: 40 meq via ORAL
  Filled 2017-01-16: qty 2

## 2017-01-16 MED ORDER — SODIUM CHLORIDE 0.9 % IV BOLUS (SEPSIS)
1000.0000 mL | Freq: Once | INTRAVENOUS | Status: AC
  Administered 2017-01-16: 1000 mL via INTRAVENOUS

## 2017-01-16 MED ORDER — INSULIN ASPART 100 UNIT/ML ~~LOC~~ SOLN: 0.0000 [IU] | Freq: Every day | SUBCUTANEOUS | Status: DC

## 2017-01-16 MED ORDER — ENOXAPARIN SODIUM 40 MG/0.4ML ~~LOC~~ SOLN
40.0000 mg | SUBCUTANEOUS | Status: DC
  Administered 2017-01-16 – 2017-01-17 (×2): 40 mg via SUBCUTANEOUS
  Filled 2017-01-16 (×2): qty 0.4

## 2017-01-16 NOTE — Progress Notes (Signed)
MEDICATION RELATED CONSULT NOTE  Pharmacy Consult for electrolyte management  Indication: Insulin Drip secondary to DKA    Pharmacy consulted for electrolyte management for 56 yo male admitted to ICU for DKA and requiring insulin drip.    Plan:  No further replacement required. Patient being transitioned off of insulin drip and to home dose of Lantus. Will complete consult at this time.   No Known Allergies  Patient Measurements: Height: 6\' 1"  (185.4 cm) Weight: 180 lb (81.6 kg) IBW/kg (Calculated) : 79.9   Vital Signs: Temp: 97.6 F (36.4 C) (03/12 2000) Temp Source: Oral (03/12 2000) BP: 102/66 (03/12 2200) Pulse Rate: 75 (03/12 2200) Intake/Output from previous day: No intake/output data recorded. Intake/Output from this shift: Total I/O In: 605.9 [I.V.:605.9] Out: -   Labs:  Recent Labs (last 2 labs)    Recent Labs  01/15/17 0803 01/15/17 1505 01/15/17 1956  WBC 15.0*  --   --   HGB 18.3*  --   --   HCT 56.7*  --   --   PLT 318  --   --   CREATININE 2.02* 1.66* 1.35*  ALBUMIN 5.1*  --   --   PROT 8.6*  --   --   AST 21  --   --   ALT 21  --   --   ALKPHOS 99  --   --   BILITOT 1.9*  --   --      Estimated Creatinine Clearance: 68.2 mL/min (by C-G formula based on SCr of 1.35 mg/dL (H)).  Last Labs       Potassium  Date Value Ref Range Status  01/15/2017 3.8 3.5 - 5.1 mmol/L Final       Microbiology: No results found for this or any previous visit (from the past 720 hour(s)).  Medical History:     Past Medical History:  Diagnosis Date  . Brain aneurysm   . Diabetes mellitus without complication (HCC)   . Hypertension     Pharmacy will continue to monitor and adjust per consult.    Thank you for this consult.  Thomasene Rippleavid Besanti, PharmD, BCPS Clinical Pharmacist 01/16/2017

## 2017-01-16 NOTE — Progress Notes (Signed)
Inpatient Diabetes Program Recommendations  AACE/ADA: New Consensus Statement on Inpatient Glycemic Control (2015)  Target Ranges:  Prepandial:   less than 140 mg/dL      Peak postprandial:   less than 180 mg/dL (1-2 hours)      Critically ill patients:  140 - 180 mg/dL   Review of Glycemic Control  Inpatient Diabetes Program Recommendations:    Waiting on BMP results. Consider Novolog Moderate Correction (0-15 units) TID + Novoog HS scale (0-5 units), Novolog 10 units TID meal coverage if patient consumes at least 50% of meals. Agree with Lantus 45 units Daily.  Looking at insulin gtt rates (at least 4 units an hour, bolusing at least 7 units for meals with glucose still trending upward with second check) may have to increase insulin.  Spoke with patient about DKA admission. Patient reports taking his medication and following a DM diet. Spoke with patient about his A1c levels and glucose control. We reviewed A1c and glucose goals for outpatient. Patient reports loosing a lot of weight (100 pounds in the last year?). Patient had questions about what acidosis was. I explained what DKA was and what hyperglycemia was doing to his circulation. Patient states his average fasting glucose is around 250's and average evening glucose ranges between 400 and meter reading high.   Patient said he felt terrible with a normal glucose this am in the 120's. Patient's glucose will most likely need to be kept above 180 for now to prevent him from feeling "low" while inpatient. Patient reports that no one can get his glucose under control. He has been seeing Dr. Aliene AltesAbisogun for a few months and has improved the A1c slightly.  Spoke with patient about following [facility] post [facility] within 1-2 weeks with this admission for insulin adjustments. Patient is going to call to make an appointment.  Thanks,  Christena DeemShannon Myrel Rappleye RN, MSN, [facility] Inpatient Diabetes Coordinator Team Pager  [phone] (8a-5p)

## 2017-01-16 NOTE — Progress Notes (Signed)
CONCERNING: IV to Oral Route Change Policy  RECOMMENDATION: This patient is receiving pantoprazole by the intravenous route.  Based on criteria approved by the Pharmacy and Therapeutics Committee, the intravenous medication(s) is/are being converted to the equivalent oral dose form(s).   DESCRIPTION: These criteria include:  The patient is eating (either orally or via tube) and/or has been taking other orally administered medications for a least 24 hours  The patient has no evidence of active gastrointestinal bleeding or impaired GI absorption (gastrectomy, short bowel, patient on TNA or NPO).  If you have questions about this conversion, please contact the Pharmacy Department  []   [phone]( [phone] )  [facility] [x]   [phone]( [phone] )  [facility] []   [phone]( [phone] )  [facility] []   [phone]( [phone] )  [facility] []   [phone]( [phone] )  [facility]   Lillar Bianca L, [facility] 01/16/2017 10:43 AM

## 2017-01-16 NOTE — Plan of Care (Signed)
Problem: Education: Goal: Ability to describe self-care measures that may prevent or decrease complications (Diabetes Survival Skills Education) will improve Outcome: Completed/Met Date Met: 01/16/17 Discussed meals and snacks and beverage options Discussed when patient was taking his medications at home Patient had been loosing weight and wondered what was going on (DKA) Spoke about Hyperglycemia and fear of hypoglycemia what to do for each Patient was following sick day guidelines Long term complications discussed  Problem: Health Behavior: Goal: Ability to identify and alter actions that are detrimental to health will improve Outcome: Completed/Met Date Met: 01/16/17 Patient reports working on smoking cessation. Patient has not smoked in 4 days. Has surgery coming up and the surgeon wanted nicotine out of his system or he will not perform surgery. Goal: Ability to manage health-related needs will improve Outcome: Completed/Met Date Met: 01/16/17 Encouraged follow up from this DKA admission with his Endocrinologist with the [facility] Dr. Graceann Congress   Problem: Metabolic: Goal: Ability to maintain appropriate glucose levels will improve Outcome: Completed/Met Date Met: 01/16/17 Glucose levels maintained on IV insulin. Risk for unstable glucose. Patient is very resistant requiring at least 4 units of insulin per hour while on the insulin gtt.  Problem: Nutritional: Goal: Maintenance of adequate nutrition will improve Outcome: Completed/Met Date Met: 01/16/17 Patient reports following a DM diet.  Problem: Physical Regulation: Goal: Diagnostic test results will improve Outcome: Completed/Met Date Met: 01/16/17 Patient informed of A1c report in Care Everywhere obtained 12/14/16

## 2017-01-16 NOTE — Progress Notes (Signed)
MEDICATION RELATED CONSULT NOTE  Pharmacy Consult for electrolyte management  Indication: Insulin Drip secondary to DKA    Pharmacy consulted for electrolyte management for 56 yo male admitted to ICU for DKA and requiring insulin drip.    Plan:  Patient remains on insulin drip. Will order KCl 40 meq po once for K < 4 and f/u next BMET.   3/13 K+ 3.7 will give another KCI 40 mEq PO x 1 and f/u next BMP @ 0900  No Known Allergies  Patient Measurements: Height: 6\' 1"  (185.4 cm) Weight: 180 lb (81.6 kg) IBW/kg (Calculated) : 79.9   Vital Signs: Temp: 97.6 F (36.4 C) (03/12 2000) Temp Source: Oral (03/12 2000) BP: 102/66 (03/12 2200) Pulse Rate: 75 (03/12 2200) Intake/Output from previous day: No intake/output data recorded. Intake/Output from this shift: Total I/O In: 605.9 [I.V.:605.9] Out: -   Labs:  Recent Labs (last 2 labs)    Recent Labs  01/15/17 0803 01/15/17 1505 01/15/17 1956  WBC 15.0*  --   --   HGB 18.3*  --   --   HCT 56.7*  --   --   PLT 318  --   --   CREATININE 2.02* 1.66* 1.35*  ALBUMIN 5.1*  --   --   PROT 8.6*  --   --   AST 21  --   --   ALT 21  --   --   ALKPHOS 99  --   --   BILITOT 1.9*  --   --      Estimated Creatinine Clearance: 68.2 mL/min (by C-G formula based on SCr of 1.35 mg/dL (H)).  Last Labs       Potassium  Date Value Ref Range Status  01/15/2017 3.8 3.5 - 5.1 mmol/L Final       Microbiology: No results found for this or any previous visit (from the past 720 hour(s)).  Medical History:     Past Medical History:  Diagnosis Date  . Brain aneurysm   . Diabetes mellitus without complication (HCC)   . Hypertension     Pharmacy will continue to monitor and adjust per consult.    Thank you for this consult.  Thomasene Rippleavid Kassey Laforest, PharmD, BCPS Clinical Pharmacist 01/16/2017

## 2017-01-16 NOTE — Progress Notes (Signed)
[facility] [facility] [place] [facility]   PATIENT NAME: Spencer [name]    MR#:  [id number]  DATE OF BIRTH:  03/28/1961  SUBJECTIVE:  CHIEF COMPLAINT:   Chief Complaint  Patient presents with  . Abdominal Pain  . Shortness of Breath  . Emesis   - admitted yesterday with DKA, remains on insulin drip, anion gap closed, bicarb still low - feels dizzy as sugars well controlled  REVIEW OF SYSTEMS:  Review of Systems  Constitutional: Negative for fever, malaise/fatigue and weight loss.  HENT: Negative for ear discharge, hearing loss and nosebleeds.   Eyes: Negative for blurred vision, double vision and photophobia.  Respiratory: Negative for cough, hemoptysis, shortness of breath and wheezing.   Cardiovascular: Negative for chest pain, palpitations, orthopnea and leg swelling.  Gastrointestinal: Positive for nausea. Negative for abdominal pain, diarrhea, melena and vomiting.  Genitourinary: Negative for dysuria and urgency.  Musculoskeletal: Positive for joint pain and myalgias. Negative for back pain and neck pain.  Skin: Negative for rash.  Neurological: Positive for dizziness. Negative for tremors, sensory change, speech change and focal weakness.  Endo/Heme/Allergies: Does not bruise/bleed easily.  Psychiatric/Behavioral: Negative for depression.    DRUG ALLERGIES:  No Known Allergies  VITALS:  Blood pressure (!) 106/58, pulse 69, temperature 97.9 F (36.6 C), temperature source Oral, resp. rate 19, height 6\' 1"  (1.854 m), weight 81.6 kg (180 lb), SpO2 91 %.  PHYSICAL EXAMINATION:  Physical Exam  GENERAL:  56 y.o.-year-old patient lying in the bed with no acute distress.  EYES: Pupils equal, round, reactive to light and accommodation. No scleral icterus. Extraocular muscles intact.  HEENT: Head atraumatic, normocephalic. Oropharynx and nasopharynx clear.  NECK:  Supple, no jugular venous distention. No thyroid enlargement, no tenderness.  LUNGS: Normal breath  sounds bilaterally, no wheezing, rales,rhonchi or crepitation. No use of accessory muscles of respiration.  CARDIOVASCULAR: S1, S2 normal. No murmurs, rubs, or gallops.  ABDOMEN: Soft, non tender, nondistended. Bowel sounds present. No organomegaly or mass.  EXTREMITIES: No pedal edema, cyanosis, or clubbing. Left foot in a post op shoe NEUROLOGIC: Cranial nerves II through XII are intact. Muscle strength 5/5 in all extremities. Sensation intact. Gait not checked.  PSYCHIATRIC: The patient is alert and oriented x 3.  SKIN: No obvious rash, lesion, or ulcer.    LABORATORY PANEL:   CBC  Recent Labs Lab 01/16/17 0443  WBC 8.0  HGB 13.4  HCT 38.4*  PLT 194   ------------------------------------------------------------------------------------------------------------------  Chemistries   Recent Labs Lab 01/15/17 0803  01/16/17 0919  NA 134*  < > 130*  K 5.4*  < > 4.4  CL 99*  < > 110  CO2 <7*  < > 16*  GLUCOSE 589*  < > 239*  BUN 31*  < > 25*  CREATININE 2.02*  < > 1.04  CALCIUM 9.2  < > 8.3*  AST 21  --   --   ALT 21  --   --   ALKPHOS 99  --   --   BILITOT 1.9*  --   --   < > = values in this interval not displayed. ------------------------------------------------------------------------------------------------------------------  Cardiac Enzymes No results for input(s): TROPONINI in the last 168 hours. ------------------------------------------------------------------------------------------------------------------  RADIOLOGY:  Dg Chest 2 View  Result Date: 01/15/2017 CLINICAL DATA:  Shortness of breath for 4 days EXAM: CHEST  2 VIEW COMPARISON:  November 06, 2016 FINDINGS: There is no edema or consolidation. Heart size and pulmonary vascularity are normal. No  adenopathy. There arm metallic fragments on the right. There is postoperative change in the lower cervical spine. IMPRESSION: No edema or consolidation. Electronically Signed   By: Bretta Bang III M.D.   On:  01/15/2017 08:43    EKG:   Orders placed or performed during the hospital encounter of 01/15/17  . ED EKG  . ED EKG  . EKG 12-Lead  . EKG 12-Lead    ASSESSMENT AND PLAN:   Spencer [name]  is a 56 y.o. male with a known history of Type 2 diabetes mellitus insulin-dependent, hypertension, neuropathy presents to hospital secondary to worsening abdominal pain, polyuria and polydipsia.  #1 DKA- remains on insulin drip. Anion gap closed. Restarted his home dose of Lantus 45 units along with sliding scale insulin. -turn the drip off in one hour after giving Lantus. Continue to follow fingersticks and add NovoLog if needed. -Diabetes coordinator consult requested. Continue to follow up with endocrinology as outpatient. -Check A1c  #2 HTN- lisinopril, IV hydralazine prn  #3 ARF- ATN, improved with IV fluids. Hold nephrotoxins  #4 Diabetic neuropathy- on gabapentin  #5 Left heel fracture- follow up at Spencer [name] and continue post op shoe  #6 metabolic acidosis on admission-secondary to diabetic ketoacidosis. Much improved today. Potassium normalized. -On bicarbonate drip for 1 more day  #7 DVT Prophylaxis- subcutaneous Lovenox    All the records are reviewed and case discussed with Care Management/Social Workerr. Management plans discussed with the patient, family and they are in agreement.  CODE STATUS: Full code  TOTAL TIME TAKING CARE OF THIS PATIENT: 38 minutes.   POSSIBLE D/C tomorrow, DEPENDING ON CLINICAL CONDITION.   Enid Baas M.D on 01/16/2017 at 9:50 AM  Between 7am to 6pm - Pager - [phone]  After 6pm go to [email or web address] - password [email or web address]  [facility]  Office  [phone]  CC: Primary care physician; PROVIDER NOT IN SYSTEM

## 2017-01-16 NOTE — Progress Notes (Signed)
Inpatient Diabetes Program Recommendations  AACE/ADA: New Consensus Statement on Inpatient Glycemic Control (2015)  Target Ranges:  Prepandial:   less than 140 mg/dL      Peak postprandial:   less than 180 mg/dL (1-2 hours)      Critically ill patients:  140 - 180 mg/dL   Results for Spencer [name], Spencer [name] (MRN [id number]) as of 01/16/2017 11:48  Ref. Range 01/16/2017 09:19  Sodium Latest Ref Range: 135 - 145 mmol/L 130 (L)  Potassium Latest Ref Range: 3.5 - 5.1 mmol/L 4.4  Chloride Latest Ref Range: 101 - 111 mmol/L 110  CO2 Latest Ref Range: 22 - 32 mmol/L 16 (L)  Glucose Latest Ref Range: 65 - 99 mg/dL 409239 (H)  BUN Latest Ref Range: 6 - 20 mg/dL 25 (H)  Creatinine Latest Ref Range: 0.61 - 1.24 mg/dL 8.111.04  Calcium Latest Ref Range: 8.9 - 10.3 mg/dL 8.3 (L)  Anion gap Latest Ref Range: 5 - 15  4 (L)   Review of Glycemic Control  Diabetes history: DM 2 (Sees Dr. Aliene AltesAbisogun, last visit 12/12/16) Outpatient Diabetes medications: Per last Endo note:  Lantus 45 units Daily, Novolog 18 units TID plus correction scale (15 units listed in home med rec and is listed as not taking), Metformin 1000 mg BID Current orders for Inpatient glycemic control: IV Insulin  Inpatient Diabetes Program Recommendations:    45 units of Lantus given. CO2 still low at 16. Insulin gtt rates at 4 units/hr. Dr. Nemiah CommanderKalisetti called. Keep patient on IV insulin until BMP results are in and CO2 trends come up.  Thanks,  Spencer DeemShannon Victorino Fatzinger [name], Spencer [name], Spencer [name] Inpatient Diabetes Coordinator Team Pager [phone] (8a-5p)

## 2017-01-16 NOTE — Progress Notes (Signed)
Dr. Nemiah CommanderKalisetti stated that when pt's 1800 BMP comes back, if CO2 is >18 to send pt to the floor.

## 2017-01-17 LAB — BASIC METABOLIC PANEL
ANION GAP: 6 (ref 5–15)
BUN: 18 mg/dL (ref 6–20)
CALCIUM: 8.6 mg/dL — AB (ref 8.9–10.3)
CO2: 20 mmol/L — ABNORMAL LOW (ref 22–32)
Chloride: 108 mmol/L (ref 101–111)
Creatinine, Ser: 0.93 mg/dL (ref 0.61–1.24)
GFR calc non Af Amer: >60 mL/min (ref >60)
Glucose, Bld: 236 mg/dL — ABNORMAL HIGH (ref 65–99)
POTASSIUM: 3.7 mmol/L (ref 3.5–5.1)
SODIUM: 134 mmol/L — AB (ref 135–145)

## 2017-01-17 LAB — GLUCOSE, CAPILLARY
GLUCOSE-CAPILLARY: 257 mg/dL — AB (ref 65–99)
GLUCOSE-CAPILLARY: 124 mg/dL — AB (ref 65–99)
Glucose-Capillary: 167 mg/dL — ABNORMAL HIGH (ref 65–99)
Glucose-Capillary: 175 mg/dL — ABNORMAL HIGH (ref 65–99)

## 2017-01-17 MED ORDER — INSULIN ASPART 100 UNIT/ML ~~LOC~~ SOLN
6.0000 [IU] | Freq: Three times a day (TID) | SUBCUTANEOUS | Status: DC
  Administered 2017-01-17 – 2017-01-18 (×3): 6 [IU] via SUBCUTANEOUS
  Filled 2017-01-17 (×3): qty 6

## 2017-01-17 MED ORDER — SODIUM CHLORIDE 0.9 % IV SOLN
INTRAVENOUS | Status: DC
  Administered 2017-01-17 – 2017-01-18 (×2): via INTRAVENOUS

## 2017-01-17 MED ORDER — INSULIN GLARGINE 100 UNIT/ML ~~LOC~~ SOLN
2.0000 [IU] | Freq: Once | SUBCUTANEOUS | Status: AC
  Administered 2017-01-17: 2 [IU] via SUBCUTANEOUS
  Filled 2017-01-17: qty 0.02

## 2017-01-17 MED ORDER — INSULIN GLARGINE 100 UNIT/ML ~~LOC~~ SOLN
47.0000 [IU] | Freq: Every day | SUBCUTANEOUS | Status: DC
  Administered 2017-01-18: 47 [IU] via SUBCUTANEOUS
  Filled 2017-01-17: qty 0.47

## 2017-01-17 NOTE — Progress Notes (Signed)
Initial Nutrition Assessment  DOCUMENTATION CODES:   Not applicable  INTERVENTION:  -Snacks between meals as needed; can offer double portion of protein foods and non-starchy vegetables -Reviewed basic of carb counting with pt; encouraged pt to adhere to diet and explained that wt maintenance will be difficult as long as his diabetes is poorly managed. Encouraged adherence with medications, diet, etc  NUTRITION DIAGNOSIS:   Unintentional weight loss related to chronic illness as evidenced by percent weight loss.  GOAL:   Patient will meet greater than or equal to 90% of their needs  MONITOR:   PO intake, Labs, Weight trends  REASON FOR ASSESSMENT:   Malnutrition Screening Tool    ASSESSMENT:    56 yo male admitted with abdominal pain, N/V with polyuria and polydipsia with DKA. Pt with hx of DM, HTN, neuropathy   Pt off insulin drip. Eating 100% of meals of carb modified diet at present with good appetite and requesting more food. Pt reports significant wt loss, reports 100 pounds in past year. Noted pt with poorly managed DM, HgbA1c 12.6  Nutrition-Focused physical exam completed. Findings are WDL for fat depletion, muscle depletion, and edema.   Labs: CBGs 75-257, HgbA1c 12.6 Meds: ss novolog, lantus, novolog with meals  Diet Order:  Diet Carb Modified Fluid consistency: Thin; Room service appropriate? Yes  Skin:  Reviewed, no issues  Last BM:  01/14/17  Height:   Ht Readings from Last 1 Encounters:  01/15/17 6\' 1"  (1.854 m)    Weight:   Wt Readings from Last 1 Encounters:  01/15/17 180 lb (81.6 kg)    BMI:  Body mass index is 23.75 kg/m.  Estimated Nutritional Needs:   Kcal:  2050-2400 kcals  Protein:  100-120 g  Fluid:  >/= 2 L  EDUCATION NEEDS:   Education needs addressed  Romelle Starcherate Spencer Reisig MS, RD, LDN [phone] Pager  [phone] Weekend/On-Call Pager

## 2017-01-17 NOTE — Progress Notes (Signed)
Inpatient Diabetes Program Recommendations  AACE/ADA: New Consensus Statement on Inpatient Glycemic Control (2015)  Target Ranges:  Prepandial:   less than 140 mg/dL      Peak postprandial:   less than 180 mg/dL (1-2 hours)      Critically ill patients:  140 - 180 mg/dL  Results for Spencer [name], [name] (MRN [id number]) as of 01/17/2017 07:51  Ref. Range 01/16/2017 08:20 01/16/2017 09:22 01/16/2017 10:29 01/16/2017 11:40 01/16/2017 12:33 01/16/2017 13:39 01/16/2017 14:44 01/16/2017 15:51 01/16/2017 16:49 01/16/2017 17:57 01/16/2017 21:13 01/17/2017 07:39  Glucose-Capillary Latest Ref Range: 65 - 99 mg/dL 409126 (H) 811196 (H) 914222 (H) 183 (H) 180 (H) 220 (H) 179 (H) 135 (H) 75 162 (H) 189 (H) 167 (H)    Review of Glycemic Control  Diabetes history: DM 2 (Sees Dr. Aliene AltesAbisogun, last visit 12/12/16) Outpatient Diabetes medications: Per last Endo note:  Lantus 45 units Daily, Novolog 18 units TID plus correction scale (15 units listed in home med rec and is listed as not taking), Metformin 1000 mg BID Current orders for Inpatient glycemic control: Lantus 45 units daily, Novolog 0-9 units TID with meals, Novolog 0-5 units QHS  Inpatient Diabetes Program Recommendations: Insulin - Basal: Please consider increasing Lantus to 47 units daily. Insulin - Meal Coverage: Please consider ordering Novolog 6 units TID with meals for meal coverage if patient eats at least 50% of meals. HgbA1C: A1C 12.6% on 01/15/17 indicating an average glucose of 315 mg/dl over the past 2-3 months.   Thanks, Orlando PennerMarie Nicki Furlan, RN, MSN, CDE Diabetes Coordinator Inpatient Diabetes Program [phone] (Team Pager from 8am to 5pm)

## 2017-01-17 NOTE — Progress Notes (Signed)
[facility] - [place] at [facility]   PATIENT NAME: Spencer [name]    MR#:  [id number]  DATE OF BIRTH:  1961-03-19  SUBJECTIVE:  CHIEF COMPLAINT:   Chief Complaint  Patient presents with  . Abdominal Pain  . Shortness of Breath  . Emesis   - admitted  with DKA, off insulin drip, anion gap closed, bicarb still lowAt 20 - feelsWeak and dizzy  REVIEW OF SYSTEMS:  Review of Systems  Constitutional: Negative for fever, malaise/fatigue and weight loss.  HENT: Negative for ear discharge, hearing loss and nosebleeds.   Eyes: Negative for blurred vision, double vision and photophobia.  Respiratory: Negative for cough, hemoptysis, shortness of breath and wheezing.   Cardiovascular: Negative for chest pain, palpitations, orthopnea and leg swelling.  Gastrointestinal: Negative for abdominal pain, diarrhea, melena, nausea and vomiting.  Genitourinary: Negative for dysuria and urgency.  Musculoskeletal: Positive for joint pain. Negative for back pain, myalgias and neck pain.  Skin: Negative for rash.  Neurological: Positive for dizziness. Negative for tremors, sensory change, speech change and focal weakness.  Endo/Heme/Allergies: Does not bruise/bleed easily.  Psychiatric/Behavioral: Negative for depression.    DRUG ALLERGIES:  No Known Allergies  VITALS:  Blood pressure 131/72, pulse 68, temperature 97.9 F (36.6 C), temperature source Oral, resp. rate 20, height 6\' 1"  (1.854 m), weight 81.6 kg (180 lb), SpO2 99 %.  PHYSICAL EXAMINATION:  Physical Exam  GENERAL:  56 y.o.-year-old patient lying in the bed with no acute distress.  EYES: Pupils equal, round, reactive to light and accommodation. No scleral icterus. Extraocular muscles intact.  HEENT: Head atraumatic, normocephalic. Oropharynx and nasopharynx clear.  NECK:  Supple, no jugular venous distention. No thyroid enlargement, no tenderness.  LUNGS: Normal breath sounds bilaterally, no wheezing, rales,rhonchi or  crepitation. No use of accessory muscles of respiration.  CARDIOVASCULAR: S1, S2 normal. No murmurs, rubs, or gallops.  ABDOMEN: Soft, non tender, nondistended. Bowel sounds present. No organomegaly or mass.  EXTREMITIES: No pedal edema, cyanosis, or clubbing. Left foot in a post op shoe NEUROLOGIC: Cranial nerves II through XII are intact. Muscle strength 5/5 in all extremities. Sensation intact. Gait not checked.  PSYCHIATRIC: The patient is alert and oriented x 3.  SKIN: No obvious rash, lesion, or ulcer.    LABORATORY PANEL:   CBC  Recent Labs Lab 01/16/17 0443  WBC 8.0  HGB 13.4  HCT 38.4*  PLT 194   ------------------------------------------------------------------------------------------------------------------  Chemistries   Recent Labs Lab 01/15/17 0803  01/17/17 0425  NA 134*  < > 134*  K 5.4*  < > 3.7  CL 99*  < > 108  CO2 <7*  < > 20*  GLUCOSE 589*  < > 236*  BUN 31*  < > 18  CREATININE 2.02*  < > 0.93  CALCIUM 9.2  < > 8.6*  AST 21  --   --   ALT 21  --   --   ALKPHOS 99  --   --   BILITOT 1.9*  --   --   < > = values in this interval not displayed. ------------------------------------------------------------------------------------------------------------------  Cardiac Enzymes No results for input(s): TROPONINI in the last 168 hours. ------------------------------------------------------------------------------------------------------------------  RADIOLOGY:  No results found.  EKG:   Orders placed or performed during the hospital encounter of 01/15/17  . ED EKG  . ED EKG  . EKG 12-Lead  . EKG 12-Lead    ASSESSMENT AND PLAN:   Spencer [name]  is a 56 y.o. male  with a known history of Type 2 diabetes mellitus insulin-dependent, hypertension, neuropathy presents to hospital secondary to worsening abdominal pain, polyuria and polydipsia.  #1 DKA- off iinsulin drip. Anion gap closed. Bicarbonate is still low  Increase Lantus to 47 units daily  and NovoLog 6 units 3 times a day with meals is added to the regimen continue sliding scale insulin follow-up with diabetic coordinator Continue IV fluids --Diabetes coordinator is following the patient. Continue to follow up with endocrinology as outpatient. - A1c-12.6  #2 HTN- lisinopril, IV hydralazine prn  #3 ARF- ATN, improved with IV fluids. Hold nephrotoxins  #4 Diabetic neuropathy- on gabapentin  #5 Left heel fracture- [facility] and continue post op shoe  #6 metabolic acidosis on admission-secondary to diabetic ketoacidosis. Improved but bicarbonate is still low. Potassium normalized. -Status post bicarbonate gtt. Continue IV fluids for 1 more day  #7 DVT Prophylaxis- subcutaneous Lovenox  Anticipate discharge in a.m.  All the records are reviewed and case discussed with Care Management/Social Workerr. Management plans discussed with the patient, family and they are in agreement.  CODE STATUS: Full code  TOTAL TIME TAKING CARE OF THIS PATIENT: 36 minutes.   POSSIBLE D/C tomorrow, DEPENDING ON CLINICAL CONDITION.   Ramonita LabGouru, Spencer [name] M.D on 01/17/2017 at 2:07 PM  Between 7am to 6pm - Pager - [phone]  After 6pm go to [email or web address] - password [email or web address]  [facility] [place] [facility]  Office  [phone]  CC: Primary care physician; PROVIDER NOT IN SYSTEM

## 2017-01-18 LAB — GLUCOSE, CAPILLARY
GLUCOSE-CAPILLARY: 138 mg/dL — AB (ref 65–99)
Glucose-Capillary: 89 mg/dL (ref 65–99)

## 2017-01-18 LAB — BASIC METABOLIC PANEL
ANION GAP: 5 (ref 5–15)
BUN: 15 mg/dL (ref 6–20)
CO2: 24 mmol/L (ref 22–32)
Calcium: 8.4 mg/dL — ABNORMAL LOW (ref 8.9–10.3)
Chloride: 107 mmol/L (ref 101–111)
Creatinine, Ser: 0.81 mg/dL (ref 0.61–1.24)
GFR calc Af Amer: >60 mL/min (ref >60)
GLUCOSE: 277 mg/dL — AB (ref 65–99)
POTASSIUM: 3.4 mmol/L — AB (ref 3.5–5.1)
SODIUM: 136 mmol/L (ref 135–145)

## 2017-01-18 MED ORDER — INSULIN ASPART 100 UNIT/ML ~~LOC~~ SOLN: 0.0000 [IU] | Freq: Three times a day (TID) | SUBCUTANEOUS | 11 refills | Status: DC

## 2017-01-18 MED ORDER — INSULIN ASPART 100 UNIT/ML ~~LOC~~ SOLN: 0.0000 [IU] | Freq: Every day | SUBCUTANEOUS | 11 refills | Status: DC

## 2017-01-18 MED ORDER — INSULIN ASPART 100 UNIT/ML ~~LOC~~ SOLN: 7.0000 [IU] | Freq: Three times a day (TID) | SUBCUTANEOUS | 11 refills | Status: DC

## 2017-01-18 MED ORDER — INSULIN GLARGINE 100 UNIT/ML ~~LOC~~ SOLN: 47.0000 [IU] | Freq: Every day | SUBCUTANEOUS | 0 refills | Status: DC

## 2017-01-18 NOTE — Progress Notes (Signed)
Met with the patient and his sister regarding A1C which is elevated since January 2018.  Patient admits to skipping mealtime insulin if CBG is below 238m/dl.  Explained the need for insulin at EVERY meal- strongly encouraged to write every CBG down and the Novolog dose taken beside it.  He is very afraid of having a low blood sugar.  I have suggested he start with a low dose such as Novolog 6 units tid consistently at each meal then increase every couple days 1 more unit at the three meals. He will tolerate this and be able to see the improvement.    Discussed the need for good blood sugar control (A1C less than 7%) in order to have a successful ankle surgery.  Follow up with orthopedics on February 25, 2017.  Encouraged to discuss Chantix with MD to aid in quitting smoking.  JGentry Fitz RN, BA, MHA, CDE Diabetes Coordinator Inpatient Diabetes Program  [phone](Team Pager) [phone]([place] 01/18/2017 12:02 PM

## 2017-01-18 NOTE — Discharge Instructions (Signed)
Follow-up with primary care physician on March 16 at 9 AM [facility] Follow-up with outpatient endocrinologist in 1-2 weeks Take insulin as prescribed Counseled patient to Stop smoking

## 2017-01-18 NOTE — Progress Notes (Signed)
Patient is discharged home with sister in stable condition, discharge instructions reviewed with patient including discharge medications.  IVs were removed.

## 2017-01-18 NOTE — Discharge Summary (Addendum)
[facility] - [place] at [facility]   PATIENT NAME: Spencer [name]    MR#:  [id number]  DATE OF BIRTH:  [birth year]  DATE OF ADMISSION:  01/15/2017 ADMITTING PHYSICIAN: Enid Baas, MD  DATE OF DISCHARGE:01/18/17  PRIMARY CARE PHYSICIAN: PROVIDER NOT IN SYSTEM    ADMISSION DIAGNOSIS:  Acute kidney injury (HCC) [N17.9] Umbilical hernia without obstruction and without gangrene [K42.9] Diabetic ketoacidosis without coma associated with type 2 diabetes mellitus (HCC) [E13.10]  DISCHARGE DIAGNOSIS:  Active Problems:   DKA (diabetic ketoacidoses) (HCC)  Noncompliance with medications Tobacco abuse disorder  SECONDARY DIAGNOSIS:   Past Medical History:  Diagnosis Date  . Brain aneurysm   . Diabetes mellitus without complication (HCC)   . Hypertension     HOSPITAL COURSE:  Spencer [name]  is a 56 y.o. male with a known history of Type 2 diabetes mellitus insulin-dependent, hypertension, neuropathy presents to hospital secondary to worsening abdominal pain, polyuria and polydipsia. Patient states he has had been admitted to the hospital for ketoacidosis in the past. He states that he has been compliant with his insulin. For 3 days now his complaints started in the form of epigastric pain with nausea vomiting. Feeling too full after eating. He had intense heartburn and started throwing up. Symptoms have gotten worse in the last 2 days. Presents to the emergency room today with anion gap greater than 30, sugars in the 500s. Noted to be in renal failure and also with hyperkalemia. His blood pressure is elevated. He is being admitted for DKA. Denies any fevers or chills. No other complaints.   #1 DKA- off iinsulin drip. Anion gap closed. Bicarbonate is still low  Increase Lantus to 47 units daily and NovoLog 5 units 3 times a day with meals is added to the regimen continue sliding scale insulin, outpatient follow-up with endocrinology s/p IV fluids --Diabetes  coordinator is following the patient. Continue to follow up with endocrinology as outpatient. - A1c-12.6 -Patient is noncompliant with his insulin. Reinforced the importance of being compliant with  medications  #2 HTN- lisinopril, IV hydralazine prn  #3 ARF- ATN, improved with IV fluids. Hold nephrotoxins  #4 Diabetic neuropathy- on gabapentin  #5 Left heel fracture- follow up at [facility] and continue post op shoe  #6 metabolic acidosis on admission-secondary to diabetic ketoacidosis. Improved with IV fluids at a slower rate-Status post bicarbonate gtt., IV fluids   #7 DVT Prophylaxis- subcutaneous Lovenox  #Tobacco abuse disorder counseled patient to quit smoking for 5 minutes. Patient is to use nicotine patch if vascular surgery at the [facility]  is agreeable with that. Follow-up appointment with primary care physician tomorrow and discuss with the PCP regarding starting him on Chantix   DISCHARGE CONDITIONS:   Stable  CONSULTS OBTAINED:     PROCEDURES  none  DRUG ALLERGIES:  No Known Allergies  DISCHARGE MEDICATIONS:   Current Discharge Medication List    START taking these medications   Details  !! insulin aspart (NOVOLOG) 100 UNIT/ML injection Inject 7 Units into the skin 3 (three) times daily with meals. Qty: 10 mL, Refills: 11    !! insulin aspart (NOVOLOG) 100 UNIT/ML injection Inject 0-5 Units into the skin at bedtime. Qty: 10 mL, Refills: 11    !! insulin aspart (NOVOLOG) 100 UNIT/ML injection Inject 0-9 Units into the skin 3 (three) times daily with meals. Qty: 10 mL, Refills: 11     !! - Potential duplicate medications found. Please discuss with provider.    CONTINUE  these medications which have CHANGED   Details  insulin glargine (LANTUS) 100 UNIT/ML injection Inject 0.47 mLs (47 Units total) into the skin daily. Qty: 100 mL, Refills: 0      CONTINUE these medications which have NOT CHANGED   Details  atorvastatin (LIPITOR) 10 MG tablet Take 10 mg  by mouth daily.    gabapentin (NEURONTIN) 300 MG capsule Take 600 mg by mouth 3 (three) times daily. Refills: 0    ibuprofen (ADVIL,MOTRIN) 800 MG tablet Take 800 mg by mouth 3 (three) times daily as needed.    lisinopril (PRINIVIL,ZESTRIL) 20 MG tablet Take 20 mg by mouth daily.    metFORMIN (GLUCOPHAGE-XR) 500 MG 24 hr tablet Take 1,000 mg by mouth every evening.    pantoprazole (PROTONIX) 40 MG tablet Take 40 mg by mouth daily.    B-D INS SYR ULTRAFINE 1CC/31G 31G X 5/16" 1 ML MISC Refills: 0    feeding supplement, GLUCERNA SHAKE, (GLUCERNA SHAKE) LIQD Take 237 mLs by mouth 3 (three) times daily between meals. Refills: 0      STOP taking these medications     insulin aspart (NOVOLOG) 100 UNIT/ML FlexPen          DISCHARGE INSTRUCTIONS:   Follow-up with primary care physician on March 16 at 9 AM [facility] Follow-up with outpatient endocrinologist in 1-2 weeks Take insulin as prescribed Counseled patient to Stop smoking   DIET:  diabetic DISCHARGE CONDITION:  stable  ACTIVITY:  As tolerated  OXYGEN:  Home Oxygen:  Oxygen Delivery:none  DISCHARGE LOCATION:  home  If you experience worsening of your admission symptoms, develop shortness of breath, life threatening emergency, suicidal or homicidal thoughts you must seek medical attention immediately by calling 911 or calling your MD immediately  if symptoms less severe.  You Must read complete instructions/literature along with all the possible adverse reactions/side effects for all the Medicines you take and that have been prescribed to you. Take any new Medicines after you have completely understood and accpet all the possible adverse reactions/side effects.   Please note  You were cared for by a hospitalist during your hospital stay. If you have any questions about your discharge medications or the care you received while you were in the hospital after you are  discharged, you can call the unit and asked to speak with the hospitalist on call if the hospitalist that took care of you is not available. Once you are discharged, your primary care physician will handle any further medical issues. Please note that NO REFILLS for any discharge medications will be authorized once you are discharged, as it is imperative that you return to your primary care physician (or establish a relationship with a primary care physician if you do not have one) for your aftercare needs so that they can reassess your need for medications and monitor your lab values.     Today  Chief Complaint  Patient presents with  . Abdominal Pain  . Shortness of Breath  . Emesis   Patient is doing much better today denies any weakness or shortness of breath. Wants to go home planning to quit smoking  ROS:  CONSTITUTIONAL: Denies fevers, chills. Denies any fatigue, weakness.  EYES: Denies blurry vision, double vision, eye pain. EARS, NOSE, THROAT: Denies tinnitus, ear pain, hearing loss. RESPIRATORY: Denies cough, wheeze, shortness of breath.  CARDIOVASCULAR: Denies chest pain, palpitations, edema.  GASTROINTESTINAL: Denies nausea, vomiting, diarrhea, abdominal pain. Denies bright red blood  per rectum. GENITOURINARY: Denies dysuria, hematuria. ENDOCRINE: Denies nocturia or thyroid problems. HEMATOLOGIC AND LYMPHATIC: Denies easy bruising or bleeding. SKIN: Denies rash or lesion. MUSCULOSKELETAL: Denies pain in neck, back, shoulder, knees, hips or arthritic symptoms.  NEUROLOGIC: Denies paralysis, paresthesias.  PSYCHIATRIC: Denies anxiety or depressive symptoms.   VITAL SIGNS:  Blood pressure 136/78, pulse 68, temperature 98 F (36.7 C), temperature source Oral, resp. rate 18, height 6\' 1"  (1.854 m), weight 81.6 kg (180 lb), SpO2 99 %.  I/O:    Intake/Output Summary (Last 24 hours) at 01/18/17 1306 Last data filed at 01/18/17 1018  Gross per 24 hour  Intake              1843 ml  Output                0 ml  Net             1843 ml    PHYSICAL EXAMINATION:  GENERAL:  56 y.o.-year-old patient lying in the bed with no acute distress.  EYES: Pupils equal, round, reactive to light and accommodation. No scleral icterus. Extraocular muscles intact.  HEENT: Head atraumatic, normocephalic. Oropharynx and nasopharynx clear.  NECK:  Supple, no jugular venous distention. No thyroid enlargement, no tenderness.  LUNGS: Normal breath sounds bilaterally, no wheezing, rales,rhonchi or crepitation. No use of accessory muscles of respiration.  CARDIOVASCULAR: S1, S2 normal. No murmurs, rubs, or gallops.  ABDOMEN: Soft, non-tender, non-distended. Bowel sounds present. No organomegaly or mass.  EXTREMITIES: No pedal edema, cyanosis, or clubbing.  NEUROLOGIC: Cranial nerves II through XII are intact. Muscle strength 5/5 in all extremities. Sensation intact. Gait not checked.  PSYCHIATRIC: The patient is alert and oriented x 3.  SKIN: No obvious rash, lesion, or ulcer.   DATA REVIEW:   CBC  Recent Labs Lab 01/16/17 0443  WBC 8.0  HGB 13.4  HCT 38.4*  PLT 194    Chemistries   Recent Labs Lab 01/15/17 0803  01/18/17 0454  NA 134*  < > 136  K 5.4*  < > 3.4*  CL 99*  < > 107  CO2 <7*  < > 24  GLUCOSE 589*  < > 277*  BUN 31*  < > 15  CREATININE 2.02*  < > 0.81  CALCIUM 9.2  < > 8.4*  AST 21  --   --   ALT 21  --   --   ALKPHOS 99  --   --   BILITOT 1.9*  --   --   < > = values in this interval not displayed.  Cardiac Enzymes No results for input(s): TROPONINI in the last 168 hours.  Microbiology Results  Results for orders placed or performed during the hospital encounter of 01/15/17  MRSA PCR Screening     Status: None   Collection Time: 01/15/17  1:17 PM  Result Value Ref Range Status   MRSA by PCR NEGATIVE NEGATIVE Final    Comment:        The GeneXpert MRSA Assay (FDA approved for NASAL specimens only), is one component of a comprehensive MRSA  colonization surveillance program. It is not intended to diagnose MRSA infection nor to guide or monitor treatment for MRSA infections.     RADIOLOGY:  Dg Chest 2 View  Result Date: 01/15/2017 CLINICAL DATA:  Shortness of breath for 4 days EXAM: CHEST  2 VIEW COMPARISON:  November 06, 2016 FINDINGS: There is no edema or consolidation. Heart size and pulmonary vascularity are normal.  No adenopathy. There arm metallic fragments on the right. There is postoperative change in the lower cervical spine. IMPRESSION: No edema or consolidation. Electronically Signed   By: Bretta Bang III M.D.   On: 01/15/2017 08:43    EKG:   Orders placed or performed during the hospital encounter of 01/15/17  . ED EKG  . ED EKG  . EKG 12-Lead  . EKG 12-Lead      Management plans discussed with the patient, family and they are in agreement.  CODE STATUS:     Code Status Orders        Start     Ordered   01/15/17 1417  Full code  Continuous     01/15/17 1416    Code Status History    Date Active Date Inactive Code Status Order ID Comments User Context   11/06/2016  3:19 AM 11/09/2016  3:08 PM Full Code [id number]  Cleone Slim, MD Inpatient   11/06/2016  2:29 AM 11/06/2016  3:19 AM Full Code [id number]  Louann Sjogren, MD Inpatient   04/04/2015 12:08 AM 04/06/2015  2:18 PM Full Code [id number]  Haydee Monica, MD Inpatient      TOTAL TIME TAKING CARE OF THIS PATIENT: 45  minutes.   Note: This dictation was prepared with Dragon dictation along with smaller phrase technology. Any transcriptional errors that result from this process are unintentional.   @MEC @  on 01/18/2017 at 1:06 PM  Between 7am to 6pm - Pager - [phone]  After 6pm go to [email or web address] - password [email or web address] [facility]  [place] [facility]  Office  [phone]  CC: Primary care physician; PROVIDER NOT IN SYSTEM

## 2017-01-18 NOTE — Progress Notes (Addendum)
Inpatient Diabetes Program Recommendations  AACE/ADA: New Consensus Statement on Inpatient Glycemic Control (2015)  Target Ranges:  Prepandial:   less than 140 mg/dL      Peak postprandial:   less than 180 mg/dL (1-2 hours)      Critically ill patients:  140 - 180 mg/dL   Lab Results  Component Value Date   GLUCAP 138 (H) 01/18/2017   HGBA1C 12.6 (H) 01/15/2017    Review of Glycemic Control  Results for Clydell HakimKYLE, Brynn A (MRN [id number]) as of 01/18/2017 07:55  Ref. Range 01/17/2017 11:38 01/17/2017 16:44 01/17/2017 20:49 01/18/2017 04:54 01/18/2017 07:26  Glucose-Capillary Latest Ref Range: 65 - 99 mg/dL 409257 (H) 811124 (H) 914175 (H)  138 (H)    Diabetes history:DM 2 (Sees Dr. Aliene AltesAbisogun, last visit 12/12/16)  Outpatient Diabetes medications: Per last Endo note: Lantus 45 units Daily, Novolog 18 units TID plus correction scale (15 units listed in home med rec and is listed as not taking), Metformin 1000 mg BID  Current orders for Inpatient glycemic control: Lantus 47 units daily, Novolog 0-9 units TID with meals, Novolog 0-5 units QHS, Novolog 6 units tid  Inpatient Diabetes Program Recommendations:  Insulin - Meal Coverage: Please consider ordering Novolog 7 units TID with meals for meal coverage if patient eats at least 50% of meals.  Susette RacerJulie Fortunato Nordin, RN, BA, MHA, CDE Diabetes Coordinator Inpatient Diabetes Program  [phone] (Team Pager) [phone] [facility] Office) 01/18/2017 7:57 AM

## 2017-02-06 ENCOUNTER — Ambulatory Visit: Payer: Medicaid Other | Admitting: Gastroenterology

## 2017-03-06 ENCOUNTER — Ambulatory Visit: Payer: Medicaid Other | Admitting: Gastroenterology

## 2017-03-06 ENCOUNTER — Other Ambulatory Visit: Payer: Self-pay

## 2017-03-06 ENCOUNTER — Encounter: Payer: Self-pay | Admitting: Gastroenterology

## 2017-03-06 NOTE — Progress Notes (Deleted)
Gastroenterology Consultation  Referring Provider:     Dr. Rennie Plowman Primary Care Physician:  PROVIDER NOT IN SYSTEM Primary Gastroenterologist:  Dr. Servando Snare     Reason for Consultation:     Enlarged liver        HPI:   Spencer [name] is a 56 y.o. y/o male referred for consultation & management of Enlarged liver by Dr. Rennie Plowman.  This patient comes in today for the finding of a having a enlarged liver. The patient had an ultrasound that showed the patient to have a enlarged liver with echogenicity consistent with diffuse hepatic fatty infiltrate. The patient was recently in the hospital for diabetic ketoacidosis.  Past Medical History:  Diagnosis Date  . Brain aneurysm   . Diabetes mellitus without complication (HCC)   . Hypertension     Past Surgical History:  Procedure Laterality Date  . HERNIA REPAIR    . NECK SURGERY      Prior to Admission medications   Medication Sig Start Date End Date Taking? Authorizing Provider  atorvastatin (LIPITOR) 10 MG tablet Take 10 mg by mouth daily.    Historical Provider, MD  B-D INS SYR ULTRAFINE 1CC/31G 31G X 5/16" 1 ML MISC  01/03/17   Historical Provider, MD  feeding supplement, GLUCERNA SHAKE, (GLUCERNA SHAKE) LIQD Take 237 mLs by mouth 3 (three) times daily between meals. 11/09/16   Leana Roe Elgergawy, MD  gabapentin (NEURONTIN) 300 MG capsule Take 600 mg by mouth 3 (three) times daily. 01/03/17   Historical Provider, MD  ibuprofen (ADVIL,MOTRIN) 800 MG tablet Take 800 mg by mouth 3 (three) times daily as needed. 12/07/16   Historical Provider, MD  insulin aspart (NOVOLOG) 100 UNIT/ML injection Inject 7 Units into the skin 3 (three) times daily with meals. 01/18/17   Ramonita Lab, MD  insulin aspart (NOVOLOG) 100 UNIT/ML injection Inject 0-5 Units into the skin at bedtime. 01/18/17   Ramonita Lab, MD  insulin aspart (NOVOLOG) 100 UNIT/ML injection Inject 0-9 Units into the skin 3 (three) times daily with meals. 01/18/17   Ramonita Lab, MD    insulin glargine (LANTUS) 100 UNIT/ML injection Inject 0.47 mLs (47 Units total) into the skin daily. 01/19/17   Ramonita Lab, MD  lisinopril (PRINIVIL,ZESTRIL) 20 MG tablet Take 20 mg by mouth daily. 12/12/16 12/06/17  Historical Provider, MD  metFORMIN (GLUCOPHAGE-XR) 500 MG 24 hr tablet Take 1,000 mg by mouth every evening. 12/12/16 12/12/17  Historical Provider, MD  pantoprazole (PROTONIX) 40 MG tablet Take 40 mg by mouth daily.    Historical Provider, MD    Family History  Problem Relation Age of Onset  . CAD Sister   . Hypertension Sister      Social History  Substance Use Topics  . Smoking status: Current Every Day Smoker    Packs/day: 0.50    Years: 34.00    Types: Cigarettes  . Smokeless tobacco: Never Used  . Alcohol use 0.6 oz/week    1 Cans of beer per week    Allergies as of 03/06/2017  . (No Known Allergies)    Review of Systems:    All systems reviewed and negative except where noted in HPI.   Physical Exam:  There were no vitals taken for this visit. No LMP for male patient. Psych:  Alert and cooperative. Normal mood and affect. General:   Alert,  Well-developed, well-nourished, pleasant and cooperative in NAD Head:  Normocephalic and atraumatic. Eyes:  Sclera clear, no icterus.   Conjunctiva pink.  Ears:  Normal auditory acuity. Nose:  No deformity, discharge, or lesions. Mouth:  No deformity or lesions,oropharynx pink & moist. Neck:  Supple; no masses or thyromegaly. Lungs:  Respirations even and unlabored.  Clear throughout to auscultation.   No wheezes, crackles, or rhonchi. No acute distress. Heart:  Regular rate and rhythm; no murmurs, clicks, rubs, or gallops. Abdomen:  Normal bowel sounds.  No bruits.  Soft, non-tender and non-distended without masses, Positive for hepatomegaly or hernias noted.  No guarding or rebound tenderness.  Negative Carnett sign.   Rectal:  Deferred.  Msk:  Symmetrical without gross deformities.  Good, equal movement & strength  bilaterally. Pulses:  Normal pulses noted. Extremities:  No clubbing or edema.  No cyanosis. Neurologic:  Alert and oriented x3;  grossly normal neurologically. Skin:  Intact without significant lesions or rashes.  No jaundice. Lymph Nodes:  No significant cervical adenopathy. Psych:  Alert and cooperative. Normal mood and affect.  Imaging Studies: No results found.  Assessment and Plan:   Spencer [name] is a 56 y.o. y/o male who has a history of diabetes and was found to have an enlarged liver on ultrasound. The patient's enlarged liver is likely due to fatty infiltrate.    Midge Minium, MD. Clementeen Graham   Note: This dictation was prepared with Dragon dictation along with smaller phrase technology. Any transcriptional errors that result from this process are unintentional.

## 2018-02-13 ENCOUNTER — Encounter: Payer: Self-pay | Admitting: Emergency Medicine

## 2018-02-13 ENCOUNTER — Emergency Department
Admission: EM | Admit: 2018-02-13 | Discharge: 2018-02-13 | Disposition: A | Discharge status: Home / Self Care | Payer: Medicaid Other | Attending: Emergency Medicine | Admitting: Emergency Medicine

## 2018-02-13 ENCOUNTER — Emergency Department: Payer: Medicaid Other

## 2018-02-13 DIAGNOSIS — Y9389 Activity, other specified: Secondary | ICD-10-CM | POA: Insufficient documentation

## 2018-02-13 DIAGNOSIS — Y998 Other external cause status: Secondary | ICD-10-CM | POA: Diagnosis not present

## 2018-02-13 DIAGNOSIS — F17210 Nicotine dependence, cigarettes, uncomplicated: Secondary | ICD-10-CM | POA: Insufficient documentation

## 2018-02-13 DIAGNOSIS — I10 Essential (primary) hypertension: Secondary | ICD-10-CM | POA: Insufficient documentation

## 2018-02-13 DIAGNOSIS — Z794 Long term (current) use of insulin: Secondary | ICD-10-CM | POA: Insufficient documentation

## 2018-02-13 DIAGNOSIS — S299XXA Unspecified injury of thorax, initial encounter: Secondary | ICD-10-CM | POA: Diagnosis present

## 2018-02-13 DIAGNOSIS — Y92019 Unspecified place in single-family (private) house as the place of occurrence of the external cause: Secondary | ICD-10-CM | POA: Diagnosis not present

## 2018-02-13 DIAGNOSIS — Z79899 Other long term (current) drug therapy: Secondary | ICD-10-CM | POA: Insufficient documentation

## 2018-02-13 DIAGNOSIS — S2242XA Multiple fractures of ribs, left side, initial encounter for closed fracture: Secondary | ICD-10-CM | POA: Insufficient documentation

## 2018-02-13 DIAGNOSIS — F1410 Cocaine abuse, uncomplicated: Secondary | ICD-10-CM | POA: Insufficient documentation

## 2018-02-13 DIAGNOSIS — E119 Type 2 diabetes mellitus without complications: Secondary | ICD-10-CM | POA: Diagnosis not present

## 2018-02-13 MED ORDER — KETOROLAC TROMETHAMINE 60 MG/2ML IM SOLN
60.0000 mg | Freq: Once | INTRAMUSCULAR | Status: AC
  Administered 2018-02-13: 60 mg via INTRAMUSCULAR
  Filled 2018-02-13: qty 2

## 2018-02-13 MED ORDER — KETOROLAC TROMETHAMINE 10 MG PO TABS: 10.0000 mg | ORAL_TABLET | Freq: Four times a day (QID) | ORAL | 0 refills | Status: DC | PRN

## 2018-02-13 MED ORDER — CYCLOBENZAPRINE HCL 10 MG PO TABS: 10.0000 mg | ORAL_TABLET | Freq: Three times a day (TID) | ORAL | 0 refills | Status: DC | PRN

## 2018-02-13 MED ORDER — HYDROMORPHONE HCL 1 MG/ML IJ SOLN
1.0000 mg | Freq: Once | INTRAMUSCULAR | Status: AC
  Administered 2018-02-13: 1 mg via INTRAMUSCULAR
  Filled 2018-02-13: qty 1

## 2018-02-13 MED ORDER — OXYCODONE-ACETAMINOPHEN 7.5-325 MG PO TABS: 1.0000 | ORAL_TABLET | Freq: Four times a day (QID) | ORAL | 0 refills | Status: AC | PRN
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
## 2018-02-13 NOTE — ED Notes (Signed)
Pt to POV in wheelchair. VSS. NAD. Discharge instructions, RX and follow up discussed. All questions answered.
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
## 2018-02-13 NOTE — Discharge Instructions (Signed)
Follow-up with PCP for continued pain control during the healing process.

## 2018-03-10 IMAGING — US US ABDOMEN COMPLETE
1 series · 13 of 25 positions shown · non-contrast
Comparison: None.

CLINICAL DATA: Epigastric abdominal pain.  Hematuria.

EXAM:
ABDOMEN ULTRASOUND COMPLETE

[Series 1: us abdomen complete · 0.19mm/px · 13 of 101 slices shown]
[im 1/101]
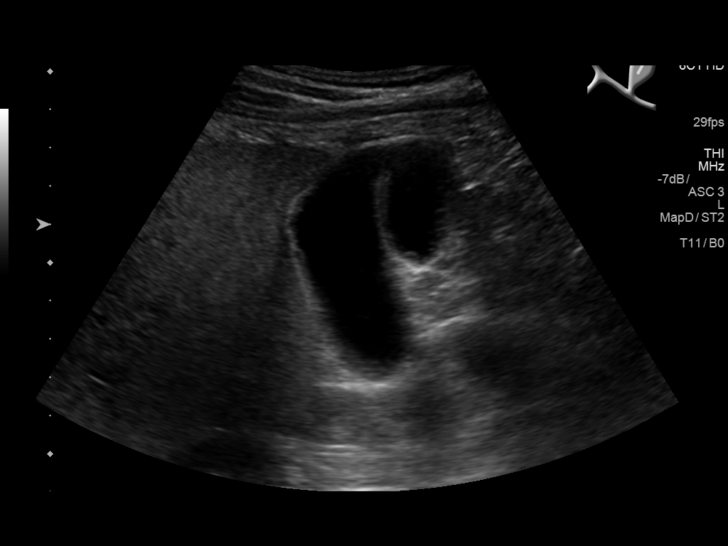
[im 9/101]
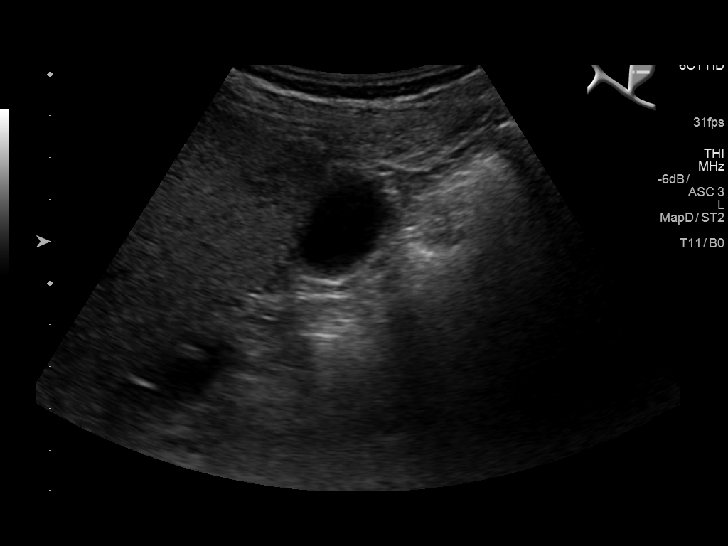
[im 17/101]
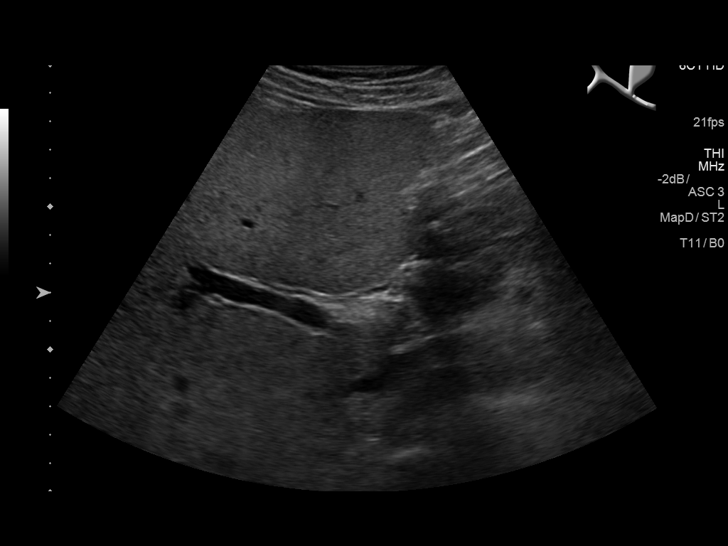
[im 26/101]
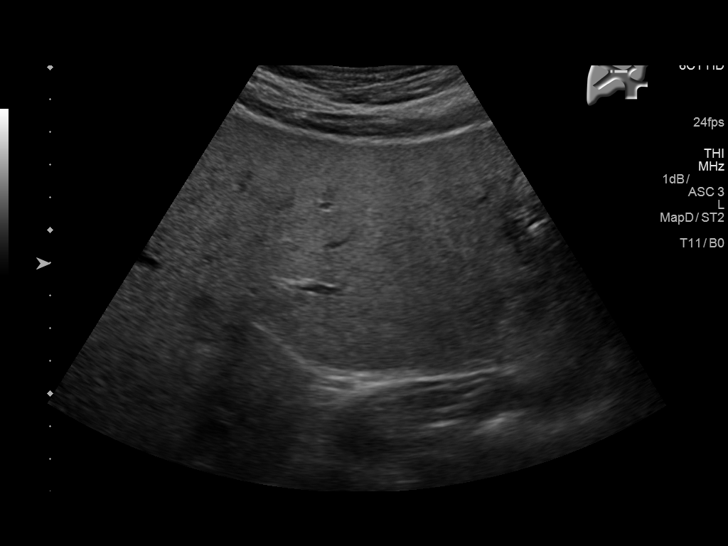
[im 34/101]
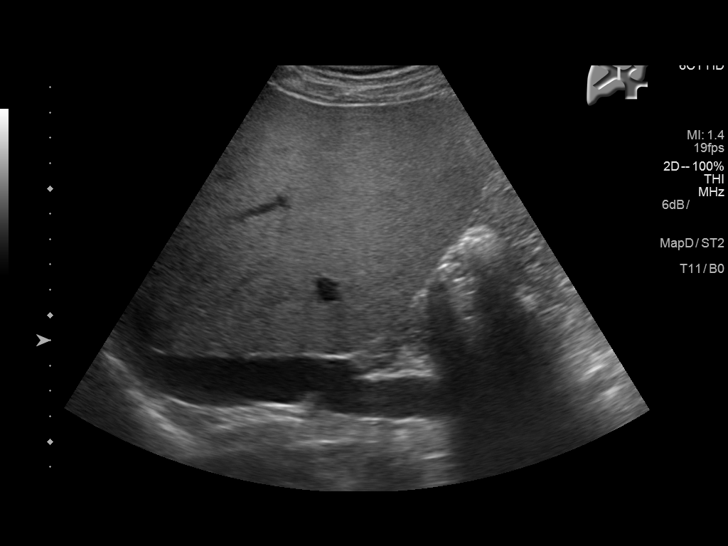
[im 42/101]
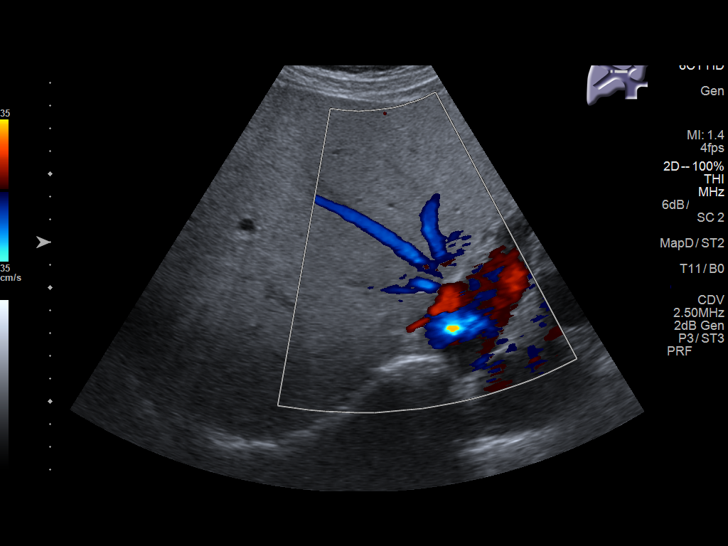
[im 51/101]
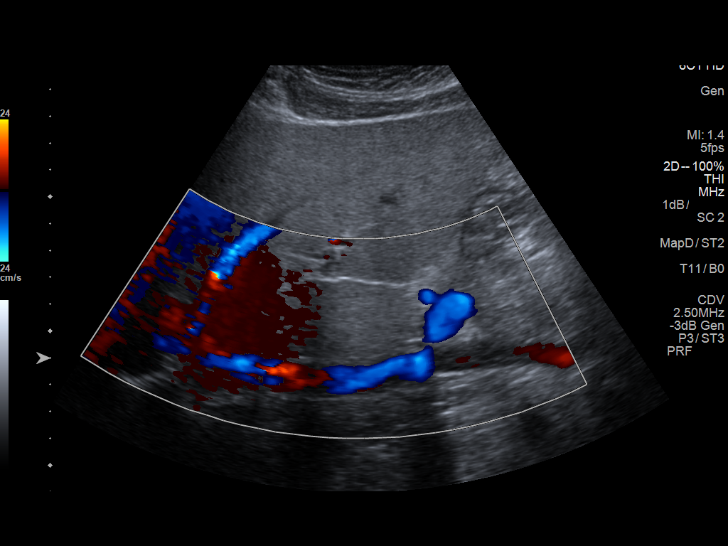
[im 59/101]
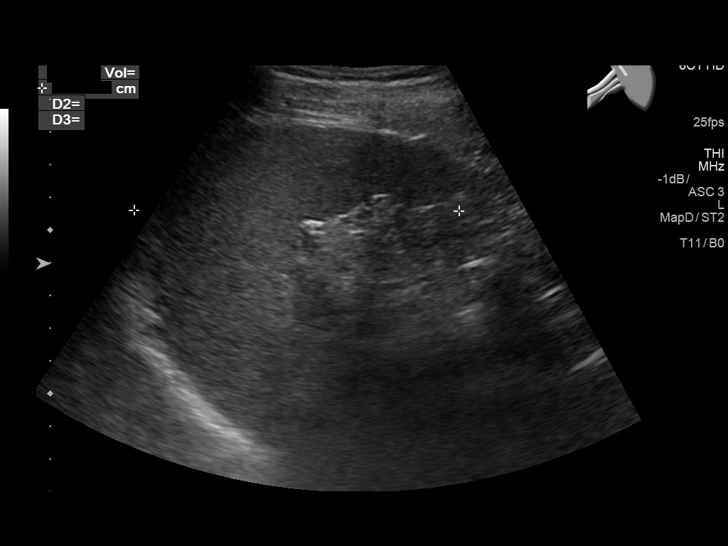
[im 67/101]
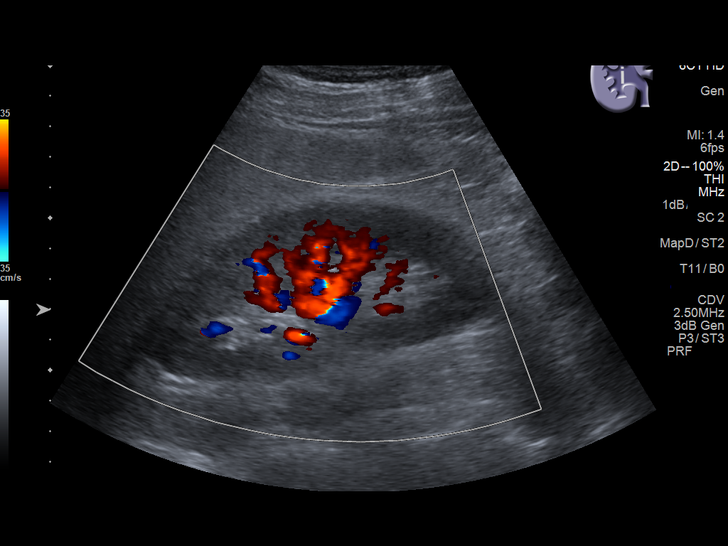
[im 76/101]
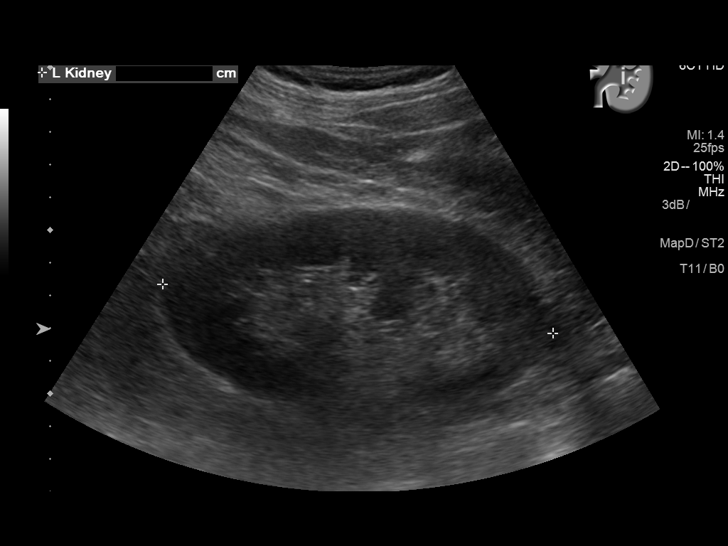
[im 84/101]
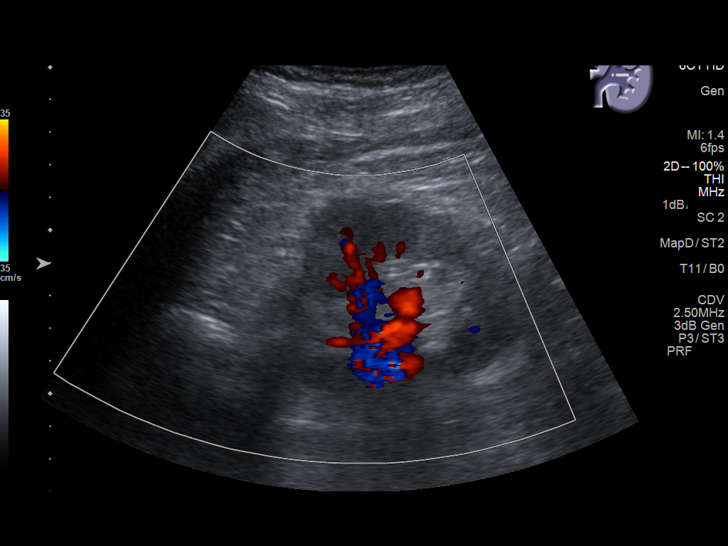
[im 92/101]
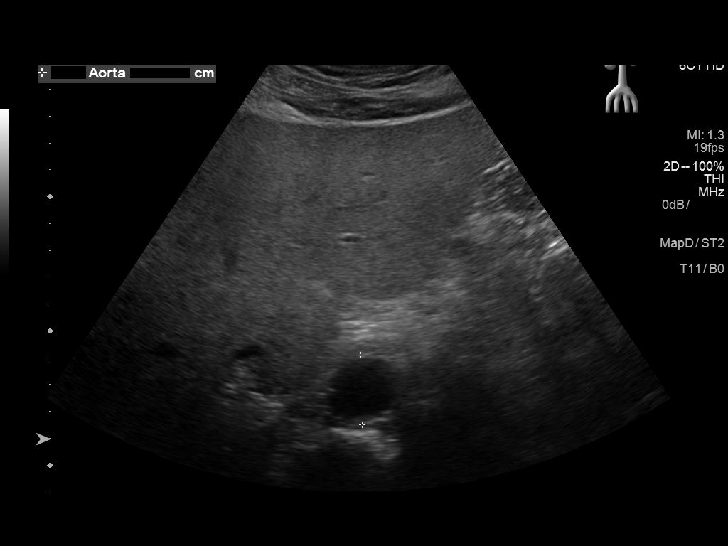
[im 101/101]
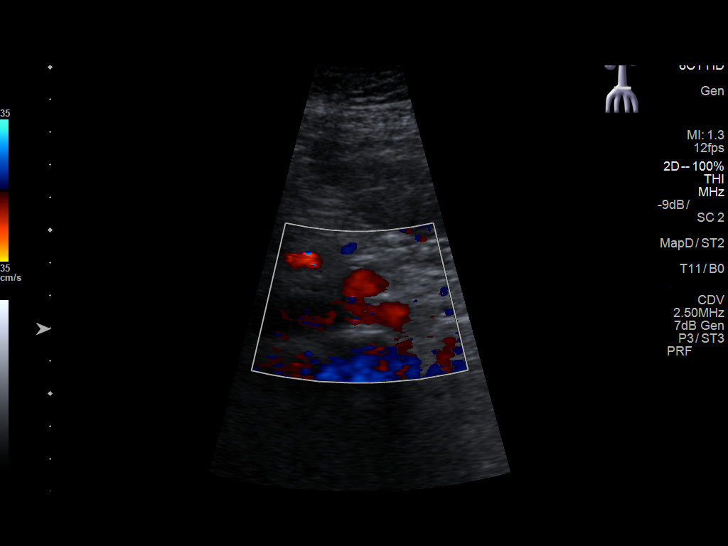

[13 of 25 positions shown; findings below may reference images not displayed]

FINDINGS: Gallbladder: No gallstones or wall thickening visualized. No
sonographic Murphy sign noted by sonographer.

Common bile duct: Diameter: Normal caliber of 3 mm.

Liver: The liver demonstrates coarse echotexture and increased
echogenicity, likely reflecting diffuse steatosis. The liver is
moderately enlarged with maximal length of greater than 20 cm. No
overt cirrhotic contour abnormalities or focal lesions are
identified. There is no evidence of intrahepatic biliary ductal
dilatation. The portal vein is open.

IVC: No abnormality visualized.

Pancreas: Visualized portion unremarkable.

Spleen: Size and appearance within normal limits.

Right Kidney: Length: 12.5 cm. Echogenicity within normal limits. No
mass or hydronephrosis visualized.

Left Kidney: Length: 12.0 cm. Echogenicity within normal limits. No
mass or hydronephrosis visualized. Simple cyst of the lower pole
measures 1.7 x 1.8 x 1.8 cm and has a benign appearance.

Abdominal aorta: No aneurysm visualized.

Other findings: No ascites visualized.
IMPRESSION: Hepatomegaly with increased echogenicity of the liver parenchyma
consistent with diffuse steatosis. No overt cirrhotic changes or
focal lesions.

## 2018-10-23 ENCOUNTER — Other Ambulatory Visit: Payer: Self-pay

## 2018-10-23 ENCOUNTER — Emergency Department: Payer: Medicaid Other

## 2018-10-23 ENCOUNTER — Inpatient Hospital Stay: Payer: Medicaid Other

## 2018-10-23 ENCOUNTER — Inpatient Hospital Stay
Admission: EM | Admit: 2018-10-23 | Discharge: 2018-10-24 | DRG: 639 | Disposition: A | Discharge status: Home / Self Care | Payer: Medicaid Other | Attending: Internal Medicine | Admitting: Internal Medicine

## 2018-10-23 DIAGNOSIS — Z8249 Family history of ischemic heart disease and other diseases of the circulatory system: Secondary | ICD-10-CM | POA: Diagnosis not present

## 2018-10-23 DIAGNOSIS — J209 Acute bronchitis, unspecified: Secondary | ICD-10-CM | POA: Diagnosis present

## 2018-10-23 DIAGNOSIS — E11649 Type 2 diabetes mellitus with hypoglycemia without coma: Principal | ICD-10-CM | POA: Diagnosis present

## 2018-10-23 DIAGNOSIS — R509 Fever, unspecified: Secondary | ICD-10-CM

## 2018-10-23 DIAGNOSIS — R55 Syncope and collapse: Secondary | ICD-10-CM | POA: Diagnosis not present

## 2018-10-23 DIAGNOSIS — I10 Essential (primary) hypertension: Secondary | ICD-10-CM | POA: Diagnosis present

## 2018-10-23 DIAGNOSIS — I671 Cerebral aneurysm, nonruptured: Secondary | ICD-10-CM | POA: Diagnosis present

## 2018-10-23 DIAGNOSIS — Z794 Long term (current) use of insulin: Secondary | ICD-10-CM | POA: Diagnosis not present

## 2018-10-23 DIAGNOSIS — R05 Cough: Secondary | ICD-10-CM

## 2018-10-23 DIAGNOSIS — E162 Hypoglycemia, unspecified: Secondary | ICD-10-CM | POA: Diagnosis present

## 2018-10-23 DIAGNOSIS — G92 Toxic encephalopathy: Secondary | ICD-10-CM | POA: Diagnosis present

## 2018-10-23 DIAGNOSIS — Z79899 Other long term (current) drug therapy: Secondary | ICD-10-CM

## 2018-10-23 DIAGNOSIS — F17210 Nicotine dependence, cigarettes, uncomplicated: Secondary | ICD-10-CM | POA: Diagnosis present

## 2018-10-23 DIAGNOSIS — F1410 Cocaine abuse, uncomplicated: Secondary | ICD-10-CM | POA: Diagnosis present

## 2018-10-23 DIAGNOSIS — Z8679 Personal history of other diseases of the circulatory system: Secondary | ICD-10-CM

## 2018-10-23 DIAGNOSIS — R059 Cough, unspecified: Secondary | ICD-10-CM

## 2018-10-23 DIAGNOSIS — Z791 Long term (current) use of non-steroidal anti-inflammatories (NSAID): Secondary | ICD-10-CM | POA: Diagnosis not present

## 2018-10-23 LAB — URINALYSIS, COMPLETE (UACMP) WITH MICROSCOPIC
Bacteria, UA: NONE SEEN
Bilirubin Urine: NEGATIVE
Glucose, UA: NEGATIVE mg/dL
Hgb urine dipstick: NEGATIVE
Ketones, ur: 20 mg/dL — AB
Leukocytes, UA: NEGATIVE
NITRITE: NEGATIVE
PH: 6 (ref 5.0–8.0)
Protein, ur: 100 mg/dL — AB
Specific Gravity, Urine: 1.024 (ref 1.005–1.030)

## 2018-10-23 LAB — INFLUENZA PANEL BY PCR (TYPE A & B)
INFLAPCR: NEGATIVE
Influenza B By PCR: NEGATIVE

## 2018-10-23 LAB — CBC WITH DIFFERENTIAL/PLATELET
Abs Immature Granulocytes: 0.1 10*3/uL — ABNORMAL HIGH (ref 0.00–0.07)
Basophils Absolute: 0 10*3/uL (ref 0.0–0.1)
Basophils Relative: 0 %
EOS ABS: 0 10*3/uL (ref 0.0–0.5)
EOS PCT: 0 %
HCT: 47.7 % (ref 39.0–52.0)
HEMOGLOBIN: 16.1 g/dL (ref 13.0–17.0)
IMMATURE GRANULOCYTES: 1 %
LYMPHS PCT: 9 %
Lymphs Abs: 1.2 10*3/uL (ref 0.7–4.0)
MCH: 32.5 pg (ref 26.0–34.0)
MCHC: 33.8 g/dL (ref 30.0–36.0)
MCV: 96.2 fL (ref 80.0–100.0)
Monocytes Absolute: 0.8 10*3/uL (ref 0.1–1.0)
Monocytes Relative: 6 %
NEUTROS PCT: 84 %
NRBC: 0 % (ref 0.0–0.2)
Neutro Abs: 10.8 10*3/uL — ABNORMAL HIGH (ref 1.7–7.7)
Platelets: 389 10*3/uL (ref 150–400)
RBC: 4.96 MIL/uL (ref 4.22–5.81)
RDW: 12.4 % (ref 11.5–15.5)
WBC: 12.9 10*3/uL — AB (ref 4.0–10.5)

## 2018-10-23 LAB — URINE DRUG SCREEN, QUALITATIVE (ARMC ONLY)
Amphetamines, Ur Screen: NOT DETECTED
Barbiturates, Ur Screen: NOT DETECTED
Benzodiazepine, Ur Scrn: NOT DETECTED
Cannabinoid 50 Ng, Ur ~~LOC~~: NOT DETECTED
Cocaine Metabolite,Ur ~~LOC~~: POSITIVE — AB
MDMA (Ecstasy)Ur Screen: NOT DETECTED
METHADONE SCREEN, URINE: NOT DETECTED
Opiate, Ur Screen: NOT DETECTED
Phencyclidine (PCP) Ur S: NOT DETECTED
Tricyclic, Ur Screen: NOT DETECTED

## 2018-10-23 LAB — COMPREHENSIVE METABOLIC PANEL
ALT: 15 U/L (ref 0–44)
ANION GAP: 11 (ref 5–15)
AST: 26 U/L (ref 15–41)
Albumin: 4.2 g/dL (ref 3.5–5.0)
Alkaline Phosphatase: 61 U/L (ref 38–126)
BUN: 34 mg/dL — ABNORMAL HIGH (ref 6–20)
CO2: 24 mmol/L (ref 22–32)
Calcium: 9.1 mg/dL (ref 8.9–10.3)
Chloride: 100 mmol/L (ref 98–111)
Creatinine, Ser: 0.9 mg/dL (ref 0.61–1.24)
GFR calc Af Amer: >60 mL/min (ref >60)
GFR calc non Af Amer: >60 mL/min (ref >60)
GLUCOSE: 79 mg/dL (ref 70–99)
POTASSIUM: 3.4 mmol/L — AB (ref 3.5–5.1)
SODIUM: 135 mmol/L (ref 135–145)
Total Bilirubin: 0.9 mg/dL (ref 0.3–1.2)
Total Protein: 8 g/dL (ref 6.5–8.1)

## 2018-10-23 LAB — GLUCOSE, CAPILLARY
GLUCOSE-CAPILLARY: 69 mg/dL — AB (ref 70–99)
Glucose-Capillary: 84 mg/dL (ref 70–99)
Glucose-Capillary: 137 mg/dL — ABNORMAL HIGH (ref 70–99)
Glucose-Capillary: 87 mg/dL (ref 70–99)
Glucose-Capillary: 50 mg/dL — ABNORMAL LOW (ref 70–99)
Glucose-Capillary: 188 mg/dL — ABNORMAL HIGH (ref 70–99)
Glucose-Capillary: 123 mg/dL — ABNORMAL HIGH (ref 70–99)

## 2018-10-23 LAB — PROCALCITONIN: Procalcitonin: 0.59 ng/mL

## 2018-10-23 LAB — CG4 I-STAT (LACTIC ACID): Lactic Acid, Venous: 1.33 mmol/L (ref 0.5–1.9)

## 2018-10-23 LAB — TROPONIN I: Troponin I: 0.04 ng/mL (ref <0.03)

## 2018-10-23 LAB — ETHANOL: Alcohol, Ethyl (B): <10 mg/dL (ref <10)

## 2018-10-23 LAB — CK: Total CK: 164 U/L (ref 49–397)

## 2018-10-23 MED ORDER — ACETAMINOPHEN 325 MG PO TABS: 650.0000 mg | ORAL_TABLET | Freq: Four times a day (QID) | ORAL | Status: DC | PRN

## 2018-10-23 MED ORDER — LEVOFLOXACIN IN D5W 750 MG/150ML IV SOLN
750.0000 mg | INTRAVENOUS | Status: DC
  Filled 2018-10-23: qty 150

## 2018-10-23 MED ORDER — DEXTROSE 50 % IV SOLN
25.0000 g | Freq: Once | INTRAVENOUS | Status: AC
  Administered 2018-10-23: 25 g via INTRAVENOUS
  Filled 2018-10-23: qty 50

## 2018-10-23 MED ORDER — ENOXAPARIN SODIUM 40 MG/0.4ML ~~LOC~~ SOLN
40.0000 mg | SUBCUTANEOUS | Status: DC
  Administered 2018-10-23: 40 mg via SUBCUTANEOUS
  Filled 2018-10-23: qty 0.4

## 2018-10-23 MED ORDER — KCL IN DEXTROSE-NACL 20-5-0.45 MEQ/L-%-% IV SOLN
Freq: Once | INTRAVENOUS | Status: AC
Start: 2018-10-23 — End: 2018-10-23
  Administered 2018-10-23: 12:00:00 via INTRAVENOUS
  Filled 2018-10-23: qty 1000

## 2018-10-23 MED ORDER — SODIUM CHLORIDE 0.9 % IV SOLN
INTRAVENOUS | Status: DC
  Administered 2018-10-23: 19:00:00 via INTRAVENOUS

## 2018-10-23 MED ORDER — INFLUENZA VAC SPLIT QUAD 0.5 ML IM SUSY: 0.5000 mL | PREFILLED_SYRINGE | INTRAMUSCULAR | Status: DC

## 2018-10-23 MED ORDER — DEXTROSE 50 % IV SOLN
INTRAVENOUS | Status: AC
  Administered 2018-10-23: 50 mL via INTRAVENOUS
  Filled 2018-10-23: qty 50

## 2018-10-23 MED ORDER — DEXTROSE-NACL 5-0.45 % IV SOLN
INTRAVENOUS | Status: DC
  Administered 2018-10-23 (×2): via INTRAVENOUS

## 2018-10-23 MED ORDER — ONDANSETRON HCL 4 MG/2ML IJ SOLN: 4.0000 mg | Freq: Four times a day (QID) | INTRAMUSCULAR | Status: DC | PRN

## 2018-10-23 MED ORDER — LORAZEPAM 2 MG/ML IJ SOLN
1.0000 mg | Freq: Once | INTRAMUSCULAR | Status: AC
  Administered 2018-10-23: 1 mg via INTRAVENOUS
  Filled 2018-10-23: qty 1

## 2018-10-23 MED ORDER — LEVOFLOXACIN IN D5W 750 MG/150ML IV SOLN
750.0000 mg | Freq: Once | INTRAVENOUS | Status: AC
  Administered 2018-10-23: 750 mg via INTRAVENOUS
  Filled 2018-10-23: qty 150

## 2018-10-23 MED ORDER — ACETAMINOPHEN 650 MG RE SUPP: 650.0000 mg | Freq: Four times a day (QID) | RECTAL | Status: DC | PRN

## 2018-10-23 MED ORDER — PNEUMOCOCCAL VAC POLYVALENT 25 MCG/0.5ML IJ INJ: 0.5000 mL | INJECTION | INTRAMUSCULAR | Status: DC

## 2018-10-23 MED ORDER — ONDANSETRON HCL 4 MG PO TABS: 4.0000 mg | ORAL_TABLET | Freq: Four times a day (QID) | ORAL | Status: DC | PRN

## 2018-10-23 MED ORDER — LORAZEPAM 2 MG/ML IJ SOLN
1.0000 mg | INTRAMUSCULAR | Status: DC | PRN
  Administered 2018-10-24: 1 mg via INTRAVENOUS
  Filled 2018-10-23: qty 1

## 2018-10-23 MED ORDER — DEXTROSE 50 % IV SOLN
1.0000 | Freq: Once | INTRAVENOUS | Status: AC
  Administered 2018-10-23: 50 mL via INTRAVENOUS

## 2018-10-23 NOTE — H&P (Signed)
[employer] - [place] at [facility]   PATIENT NAME: Spencer [name]    MR#:  [id number]  DATE OF BIRTH:  [birth year]  DATE OF ADMISSION:  10/23/2018  PRIMARY CARE PHYSICIAN: [name], MD   REQUESTING/REFERRING PHYSICIAN: [name], MD  CHIEF COMPLAINT:   Chief Complaint  Patient presents with  . Hypoglycemia    HISTORY OF PRESENT ILLNESS: Spencer [name]  is a 57 y.o. male with a known history of diabetes type 2, hypertension also substance abuse with cocaine who was found passed out in his car in the parking lot.  Police found him unresponsive.  Patient was hypoglycemic and he was given D50 with his mental status improving.  However now patient is confused.  He also was noted to have a fever of 102.  He has been having cough at home.  Chest x-ray was nonrevealing.  PAST MEDICAL HISTORY:   Past Medical History:  Diagnosis Date  . Brain aneurysm   . Diabetes mellitus without complication (HCC)   . Hypertension     PAST SURGICAL HISTORY:  Past Surgical History:  Procedure Laterality Date  . HERNIA REPAIR    . NECK SURGERY      SOCIAL HISTORY:  Social History   Tobacco Use  . Smoking status: Current Every Day Smoker    Packs/day: 0.50    Years: 34.00    Pack years: 17.00    Types: Cigarettes  . Smokeless tobacco: Never Used  Substance Use Topics  . Alcohol use: Yes    Alcohol/week: 1.0 standard drinks    Types: 1 Cans of beer per week    FAMILY HISTORY:  Family History  Problem Relation Age of Onset  . CAD Sister   . Hypertension Sister     DRUG ALLERGIES: No Known Allergies  REVIEW OF SYSTEMS:   CONSTITUTIONAL: Unable to provide due to his mental status  MEDICATIONS AT HOME:  Prior to Admission medications   Medication Sig Start Date End Date Taking? Authorizing Provider  atorvastatin (LIPITOR) 10 MG tablet Take 10 mg by mouth daily.   Yes [provider]  B-D INS SYR ULTRAFINE 1CC/31G 31G X 5/16" 1 ML MISC  01/03/17   Yes [provider]  cyclobenzaprine (FLEXERIL) 10 MG tablet Take 1 tablet (10 mg total) by mouth 3 (three) times daily as needed. 02/13/18  Yes Joni Reining, PA-C  feeding supplement, GLUCERNA SHAKE, (GLUCERNA SHAKE) LIQD Take 237 mLs by mouth 3 (three) times daily between meals. 11/09/16  Yes Elgergawy, Leana Roe, MD  gabapentin (NEURONTIN) 300 MG capsule Take 600 mg by mouth 3 (three) times daily. 01/03/17  Yes [provider]  ibuprofen (ADVIL,MOTRIN) 800 MG tablet Take 800 mg by mouth 3 (three) times daily as needed. 12/07/16  Yes [provider]  insulin aspart (NOVOLOG) 100 UNIT/ML injection Inject 0-5 Units into the skin at bedtime. Patient taking differently: Inject 0-20 Units into the skin at bedtime.  01/18/17  Yes Gouru, Aruna, MD  insulin glargine (LANTUS) 100 UNIT/ML injection Inject 0.47 mLs (47 Units total) into the skin daily. Patient taking differently: Inject 120 Units into the skin daily.  01/19/17  Yes Gouru, Deanna Artis, MD  ketorolac (TORADOL) 10 MG tablet Take 1 tablet (10 mg total) by mouth every 6 (six) hours as needed. 02/13/18  Yes Joni Reining, PA-C  metFORMIN (GLUCOPHAGE-XR) 500 MG 24 hr tablet Take 1,000 mg by mouth every evening. 12/12/16 10/23/18 Yes [provider]  pantoprazole (PROTONIX)  40 MG tablet Take 40 mg by mouth daily.   Yes [provider]  traZODone (DESYREL) 100 MG tablet take 1 tablet by mouth at bedtime 1 hour before BED 01/19/17  Yes [provider]      PHYSICAL EXAMINATION:   VITAL SIGNS: Blood pressure (!) 171/105, pulse 92, temperature (!) 102.2 F (39 C), temperature source Rectal, resp. rate 20, height 6\' 2"  (1.88 m), weight 72.6 kg, SpO2 97 %.  GENERAL:  57 y.o.-year-old patient lying in the bed with no acute distress.  EYES: Pupils equal, round, reactive to light and accommodation. No scleral icterus. Extraocular muscles intact.  HEENT: Head atraumatic, normocephalic. Oropharynx and nasopharynx  clear.  NECK:  Supple, no jugular venous distention. No thyroid enlargement, no tenderness.  LUNGS: Normal breath sounds bilaterally, no wheezing, rales,rhonchi or crepitation. No use of accessory muscles of respiration.  CARDIOVASCULAR: S1, S2 normal. No murmurs, rubs, or gallops.  ABDOMEN: Soft, nontender, nondistended. Bowel sounds present. No organomegaly or mass.  EXTREMITIES: No pedal edema, cyanosis, or clubbing.  NEUROLOGIC: Patient is currently confused and unable to follow commands PSYCHIATRIC: Confused SKIN: No obvious rash, lesion, or ulcer.   LABORATORY PANEL:   CBC Recent Labs  Lab 10/23/18 1134  WBC 12.9*  HGB 16.1  HCT 47.7  PLT 389  MCV 96.2  MCH 32.5  MCHC 33.8  RDW 12.4  LYMPHSABS 1.2  MONOABS 0.8  EOSABS 0.0  BASOSABS 0.0   ------------------------------------------------------------------------------------------------------------------  Chemistries  Recent Labs  Lab 10/23/18 1134  NA 135  K 3.4*  CL 100  CO2 24  GLUCOSE 79  BUN 34*  CREATININE 0.90  CALCIUM 9.1  AST 26  ALT 15  ALKPHOS 61  BILITOT 0.9   ------------------------------------------------------------------------------------------------------------------ estimated creatinine clearance is 90.8 mL/min (by C-G formula based on SCr of 0.9 mg/dL). ------------------------------------------------------------------------------------------------------------------ No results for input(s): TSH, T4TOTAL, T3FREE, THYROIDAB in the last 72 hours.  Invalid input(s): FREET3   Coagulation profile No results for input(s): INR, PROTIME in the last 168 hours. ------------------------------------------------------------------------------------------------------------------- No results for input(s): DDIMER in the last 72 hours. -------------------------------------------------------------------------------------------------------------------  Cardiac Enzymes Recent Labs  Lab 10/23/18 1134   TROPONINI 0.04*   ------------------------------------------------------------------------------------------------------------------ Invalid input(s): POCBNP  ---------------------------------------------------------------------------------------------------------------  Urinalysis    Component Value Date/Time   COLORURINE YELLOW (A) 10/23/2018 1402   APPEARANCEUR CLEAR (A) 10/23/2018 1402   LABSPEC 1.024 10/23/2018 1402   PHURINE 6.0 10/23/2018 1402   GLUCOSEU NEGATIVE 10/23/2018 1402   HGBUR NEGATIVE 10/23/2018 1402   BILIRUBINUR NEGATIVE 10/23/2018 1402   KETONESUR 20 (A) 10/23/2018 1402   PROTEINUR 100 (A) 10/23/2018 1402   NITRITE NEGATIVE 10/23/2018 1402   LEUKOCYTESUR NEGATIVE 10/23/2018 1402     RADIOLOGY: Dg Chest 1 View  Result Date: 10/23/2018 CLINICAL DATA:  Fever and cough.  Current smoker. EXAM: CHEST  1 VIEW COMPARISON:  02/13/2018 FINDINGS: Again noted are 2 small metallic densities in the right chest region. Lungs are clear without airspace disease or pulmonary edema. Negative for a pneumothorax. Surgical plate in the lower cervical spine. Heart size is normal. Bone structures are unremarkable. IMPRESSION: No active disease. Electronically Signed   By: Richarda Overlie M.D.   On: 10/23/2018 13:00    EKG: Orders placed or performed during the hospital encounter of 10/23/18  . EKG 12-Lead  . EKG 12-Lead    IMPRESSION AND PLAN: Patient is 57 year old with history of diabetes hypertension cocaine abuse presenting with decreasing responsiveness  1.  Acute encephalopathy suspect due to combination of cocaine  use and hypoglycemia I will obtain a CT scan of the head   2.  Hypoglycemia likely due to poor p.o. intake we will place the on D5 monitor blood sugars every 2 hours  3.  Accelerated hypertension we will place on IV hydralazine PRN  4.  Substance abuse counseling once awake     All the records are reviewed and case discussed with ED  provider. Management plans discussed with the patient, family and they are in agreement.  CODE STATUS: Code Status History    Date Active Date Inactive Code Status Order ID Comments User Context   01/15/2017 1417 01/18/2017 1716 Full Code [id number]  Enid BaasKalisetti, Radhika, MD Inpatient   11/06/2016 0319 11/09/2016 1508 Full Code [id number]  Cleone SlimLo Verde, Daniel, MD Inpatient   11/06/2016 0229 11/06/2016 0319 Full Code [id number]  de Casandra Doffingios, Jose Angelo A, MD Inpatient   04/04/2015 0008 04/06/2015 1418 Full Code [id number]  Haydee Monicaavid, Rachal A, MD Inpatient       TOTAL TIME TAKING CARE OF THIS PATIENT: 55 minutes.    Auburn BilberryShreyang Brin Ruggerio M.D on 10/23/2018 at 3:03 PM  Between 7am to 6pm - Pager - [phone]  After 6pm go to [email or web address] - password [email or web address]  [employer] Office  [phone]  CC: Primary care physician; Sherron Mondayejan-Sie, S Ahmed, MD

## 2018-10-23 NOTE — Consult Note (Signed)
Pharmacy Antibiotic Note  Spencer HakimRoger A [name] is a 57 y.o. male admitted on 10/23/2018 with pneumonia.  Pharmacy has been consulted for Levofloxacin dosing.  Plan: Levofloxacin 750 mg IV every 24 hours  Height: 6\' 2"  (188 cm) Weight: 160 lb (72.6 kg) IBW/kg (Calculated) : 82.2  Temp (24hrs), Avg:100.1 F (37.8 C), Min:97.9 F (36.6 C), Max:102.2 F (39 C)  Recent Labs  Lab 10/23/18 1134 10/23/18 1253  WBC 12.9*  --   CREATININE 0.90  --   LATICACIDVEN  --  1.33    Estimated Creatinine Clearance: 90.8 mL/min (by C-G formula based on SCr of 0.9 mg/dL).    No Known Allergies  Antimicrobials this admission: Levofloxacin 12/18 >>   Dose adjustments this admission:   Microbiology results: 12/18 BCx: pending  UCx:    Sputum:    MRSA PCR:   Thank you for allowing pharmacy to be a part of this patient's care.  Orinda Kennerhris A Whit Bruni, PharmD Clinical Pharmacist 10/23/2018 3:27 PM

## 2018-10-23 NOTE — ED Notes (Signed)
Pt has gotten out of bed and ripped IV out twice attempting to leave. Pt is confused and states I need to put my shoes on and go shingle the roof. Pt informed he is sick and needs to stay in bed to get fluids. Jane Consulting civil engineerCharge RN notifed at this time. French Anaracy, tech coming to be a Comptrollersitter in 15 min

## 2018-10-23 NOTE — ED Triage Notes (Addendum)
Pt to ED via EMS from store parking lot. Per EMS pt was found in car unresponsive by officer with window down pt had been there since 6pm last night, on EMS arrival pts blood sugar was reading low and pt was lethargic. Pt was given 1 tube oral glucose by ems. Pts CBG currently 84

## 2018-10-23 NOTE — ED Notes (Signed)
Pt out of bed and pulled out iv again at this time. French Anaracy tech notified and will sit with pt shortly.

## 2018-10-23 NOTE — ED Notes (Signed)
ISTAT lactic acid preformed. Result 1.33.

## 2018-10-23 NOTE — ED Notes (Signed)
Pts wallet placed in belongings bag at bedside

## 2018-10-23 NOTE — ED Provider Notes (Signed)
[facility] Emergency Department Provider Note       Time seen: ----------------------------------------- 11:22 AM on 10/23/2018 -----------------------------------------   I have reviewed the triage vital signs and the nursing notes.  HISTORY   Chief Complaint No chief complaint on file.   HPI Spencer [name] is a 57 y.o. male with a history of aneurysm, diabetes, hypertension who presents to the ED for brain aneurysm, diabetes, hypertension, cocaine abuse who was found passed out in his car in a parking lot.  Police arrived to find him unresponsive.  EMS discovered that he was hypoglycemic.  He was given D50 in route with return of his normal mental status.  Patient is denying any complaints at this time.  EMS reports he was sitting in the car with his car windows rolled down and it was below freezing outside.  Past Medical History:  Diagnosis Date  . Brain aneurysm   . Diabetes mellitus without complication (HCC)   . Hypertension     Patient Active Problem List   Diagnosis Date Noted  . Chronic low back pain 01/16/2017  . Chronic neck pain 01/16/2017  . Diabetic neuropathy (HCC) 01/16/2017  . DM2 (diabetes mellitus, type 2) (HCC) 01/16/2017  . History of cocaine abuse (HCC) 01/16/2017  . Personal history of subdural hematoma 01/16/2017  . Closed displaced fracture of body of left calcaneus with delayed healing 12/13/2016  . Protein-calorie malnutrition, severe 11/08/2016  . DKA, type 2 (HCC) 11/06/2016  . Diabetic ketoacidosis (HCC) 04/03/2015  . DKA (diabetic ketoacidoses) (HCC) 04/03/2015  . Hypertension 04/03/2015  . Depression 04/03/2015  . Opiate dependence (HCC) 04/03/2015  . Tobacco abuse 04/03/2015  . Lumbosacral neuritis 07/19/2014  . Pain of finger of right hand 07/19/2014  . Type II or unspecified type diabetes mellitus without mention of complication, not stated as uncontrolled 07/19/2014  . Knee pain 09/12/2013  . Hernia of flank  09/20/2012  . Epidermoid cyst of skin 09/20/2012  . Chronic pain of both shoulders 03/27/2012    Past Surgical History:  Procedure Laterality Date  . HERNIA REPAIR    . NECK SURGERY      Allergies Patient has no known allergies.  Social History Social History   Tobacco Use  . Smoking status: Current Every Day Smoker    Packs/day: 0.50    Years: 34.00    Pack years: 17.00    Types: Cigarettes  . Smokeless tobacco: Never Used  Substance Use Topics  . Alcohol use: Yes    Alcohol/week: 1.0 standard drinks    Types: 1 Cans of beer per week  . Drug use: No   Review of Systems Constitutional: Negative for fever. Cardiovascular: Negative for chest pain. Respiratory: Negative for shortness of breath. Gastrointestinal: Negative for abdominal pain, vomiting and diarrhea. Musculoskeletal: Negative for back pain. Skin: Negative for rash. Neurological: Negative for headaches, positive for weakness  All systems negative/normal/unremarkable except as stated in the HPI  ____________________________________________   PHYSICAL EXAM:  VITAL SIGNS: ED Triage Vitals  Enc Vitals Group     BP      Pulse      Resp      Temp      Temp src      SpO2      Weight      Height      Head Circumference      Peak Flow      Pain Score      Pain Loc  Pain Edu?      Excl. in GC?    Constitutional: Alert and oriented.  No distress, patient smells heavily of urine Eyes: Conjunctivae are normal. Normal extraocular movements. ENT   Head: Normocephalic and atraumatic.   Nose: No congestion/rhinnorhea.   Mouth/Throat: Mucous membranes are moist.   Neck: No stridor. Cardiovascular: Normal rate, regular rhythm. No murmurs, rubs, or gallops. Respiratory: Normal respiratory effort without tachypnea nor retractions. Breath sounds are clear and equal bilaterally. No wheezes/rales/rhonchi. Gastrointestinal: Soft and nontender. Normal bowel sounds Musculoskeletal: Nontender  with normal range of motion in extremities. No lower extremity tenderness nor edema. Neurologic:  Normal speech and language. No gross focal neurologic deficits are appreciated.  Skin:  Skin is warm, dry and intact. No rash noted. Psychiatric: Normal speech ____________________________________________  EKG: Interpreted by me.  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 Patient presented for hypoglycemia and altered mental status, we will assess with labs and imaging as indicated at this time. Clinical Course as of Oct 24 1447  Wed Oct 23, 2018  1235 he was found to be febrile which is likely the cause of his hypoglycemia and delirium   [JW]    Clinical Course User Index [JW] [name], MD   Procedures ____________________________________________   LABS (pertinent positives/negatives)  Labs Reviewed  CBC WITH DIFFERENTIAL/PLATELET - Abnormal; Notable for the following components:      Result Value   WBC 12.9 (*)    Neutro Abs 10.8 (*)    Abs Immature Granulocytes 0.10 (*)    All other components within normal limits  COMPREHENSIVE METABOLIC PANEL - Abnormal; Notable for the following components:   Potassium 3.4 (*)    BUN 34 (*)    All other components within normal limits  TROPONIN I - Abnormal; Notable for the following components:   Troponin I 0.04 (*)    All other components within normal limits  URINALYSIS, COMPLETE (UACMP) WITH MICROSCOPIC - Abnormal; Notable for the following components:   Color, Urine YELLOW (*)    APPearance CLEAR (*)    Ketones, ur 20 (*)    Protein, ur 100 (*)    All other components within normal limits   GLUCOSE, CAPILLARY - Abnormal; Notable for the following components:   Glucose-Capillary 69 (*)    All other components within normal limits  CULTURE, BLOOD (ROUTINE X 2)  CULTURE, BLOOD (ROUTINE X 2)  ETHANOL  CK  GLUCOSE, CAPILLARY  URINE DRUG SCREEN, QUALITATIVE ([facility] ONLY)  INFLUENZA PANEL BY PCR (TYPE A & B)  CBG MONITORING, ED  I-STAT CG4 LACTIC ACID, ED  CG4 I-STAT (LACTIC ACID)   CXR IMPRESSION: No active disease.  ____________________________________________  DIFFERENTIAL DIAGNOSIS   Medication side effect, noncompliance, occult infection, dehydration, electrolyte abnormality  FINAL ASSESSMENT AND PLAN  Hypoglycemia, fever, cough   Plan: The patient had presented for hypoglycemia. Patient's labs revealed mild leukocytosis and hypoglycemia but no other acute process. Patient's imaging was negative.  He does have a bad cough, I have ordered IV Levaquin for him.  He has received D50 and I placed him on D5 half-normal saline.  Overall he has been intermittently confused.  I will discuss with  the hospitalist for admission.   Ulice Dash, MD   Note: This note was generated in part or whole with voice recognition software. Voice recognition is usually quite accurate but there are transcription errors that can and very often do occur. I apologize for any typographical errors that were not detected and corrected.     [name], MD 10/23/18 [phone]

## 2018-10-24 LAB — GLUCOSE, CAPILLARY
GLUCOSE-CAPILLARY: 87 mg/dL (ref 70–99)
Glucose-Capillary: 193 mg/dL — ABNORMAL HIGH (ref 70–99)
Glucose-Capillary: 183 mg/dL — ABNORMAL HIGH (ref 70–99)
Glucose-Capillary: 155 mg/dL — ABNORMAL HIGH (ref 70–99)
Glucose-Capillary: 163 mg/dL — ABNORMAL HIGH (ref 70–99)

## 2018-10-24 LAB — HEMOGLOBIN A1C
Hgb A1c MFr Bld: 9.8 % — ABNORMAL HIGH (ref 4.8–5.6)
Mean Plasma Glucose: 234.56 mg/dL

## 2018-10-24 MED ORDER — INSULIN ASPART 100 UNIT/ML ~~LOC~~ SOLN
4.0000 [IU] | Freq: Three times a day (TID) | SUBCUTANEOUS | Status: DC
  Administered 2018-10-24: 4 [IU] via SUBCUTANEOUS
  Filled 2018-10-24: qty 1

## 2018-10-24 MED ORDER — INSULIN ASPART 100 UNIT/ML ~~LOC~~ SOLN: 0.0000 [IU] | Freq: Every day | SUBCUTANEOUS | Status: DC

## 2018-10-24 MED ORDER — INSULIN ASPART 100 UNIT/ML ~~LOC~~ SOLN
0.0000 [IU] | Freq: Three times a day (TID) | SUBCUTANEOUS | Status: DC
  Administered 2018-10-24: 20 [IU] via SUBCUTANEOUS
  Filled 2018-10-24: qty 1

## 2018-10-24 MED ORDER — INSULIN GLARGINE 100 UNIT/ML ~~LOC~~ SOLN
20.0000 [IU] | Freq: Two times a day (BID) | SUBCUTANEOUS | Status: DC
  Filled 2018-10-24 (×2): qty 0.2

## 2018-10-24 MED ORDER — LEVOFLOXACIN 500 MG PO TABS: 500.0000 mg | ORAL_TABLET | Freq: Every day | ORAL | 0 refills | Status: AC

## 2018-10-24 MED ORDER — DEXTROSE 10 % IV SOLN
INTRAVENOUS | Status: DC
  Administered 2018-10-24: via INTRAVENOUS

## 2018-10-24 NOTE — Progress Notes (Addendum)
Inpatient Diabetes Program Recommendations  AACE/ADA: New Consensus Statement on Inpatient Glycemic Control (2015)  Target Ranges:  Prepandial:   less than 140 mg/dL      Peak postprandial:   less than 180 mg/dL (1-2 hours)      Critically ill patients:  140 - 180 mg/dL   Lab Results  Component Value Date   GLUCAP 163 (H) 10/24/2018   HGBA1C 9.8 (H) 10/23/2018    Review of Glycemic Control Results for Spencer [name], Spencer [name] (MRN [id number]) as of 10/24/2018 11:57  Ref. Range 10/23/2018 16:59 10/23/2018 18:42 10/23/2018 20:05 10/23/2018 20:31 10/23/2018 22:08 10/23/2018 23:59 10/24/2018 02:00 10/24/2018 04:07 10/24/2018 06:14 10/24/2018 07:43  Glucose-Capillary Latest Ref Range: 70 - 99 mg/dL 784137 (H) 87 50 (L) 696188 (H) 123 (H) 87 193 (H) 183 (H) 155 (H) 163 (H)   Diabetes history: DM 2 Outpatient Diabetes medications:  Lantus 120 units daily (per medication reconciliation), Novolog 0-20 units tid Current orders for Inpatient glycemic control:  Lantus 20 units bid, Novolog 4 units tid with meals, Novolog resistant tid with meals and HS  Inpatient Diabetes Program Recommendations:    Note that initial blood sugars low.  It appears that patient has been taking large dose of Lantus prior to admit.  Will talk to patient today.    Thanks,  Beryl MeagerJenny Steffi Noviello, RN, BC-ADM Inpatient Diabetes Coordinator Pager [phone]    Addendum:  Patient d/c'd home prior to me speaking with him.  Note that Lantus dose reduced on AVS by MD.  No note.

## 2018-10-24 NOTE — Progress Notes (Signed)
Advanced care plan.  Purpose of the Encounter: CODE STATUS  Parties in Attendance: Patient  Patient's Decision Capacity: Good  Subjective/Patient's story: Presented to the emergency room for low blood sugar   Objective/Medical story Patient was found unresponsive in the parking lot outside his car.  Patient was hypoglycemic when he arrived to the emergency room.  He was given IV D50 and then he was put on IV D10 drip.  His blood sugar improved.   Goals of care determination:  Advance care directives goals of care and treatment plan discussed Patient wants everything done which includes CPR, intubation and ventilator if the need arises   CODE STATUS: Full code   Time spent discussing advanced care planning: 16 minutes
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
## 2018-10-24 NOTE — Progress Notes (Signed)
Pt discharged per MD order. IV removed. Discharge  Instructions reviewed with pt. Pt verbalized understanding of instructions. Pt wheeled to car by RN to ride home with his sister. Pt given disposable clothes to go home in.

## 2018-10-25 ENCOUNTER — Emergency Department: Payer: Medicaid Other

## 2018-10-25 ENCOUNTER — Other Ambulatory Visit: Payer: Self-pay

## 2018-10-25 ENCOUNTER — Encounter: Payer: Self-pay | Admitting: Emergency Medicine

## 2018-10-25 ENCOUNTER — Observation Stay
Admission: EM | Admit: 2018-10-25 | Discharge: 2018-10-27 | Disposition: A | Discharge status: Home / Self Care | Payer: Medicaid Other | Attending: Internal Medicine | Admitting: Internal Medicine

## 2018-10-25 DIAGNOSIS — Z23 Encounter for immunization: Secondary | ICD-10-CM | POA: Insufficient documentation

## 2018-10-25 DIAGNOSIS — E1165 Type 2 diabetes mellitus with hyperglycemia: Secondary | ICD-10-CM

## 2018-10-25 DIAGNOSIS — J189 Pneumonia, unspecified organism: Secondary | ICD-10-CM

## 2018-10-25 DIAGNOSIS — E162 Hypoglycemia, unspecified: Secondary | ICD-10-CM

## 2018-10-25 DIAGNOSIS — F17210 Nicotine dependence, cigarettes, uncomplicated: Secondary | ICD-10-CM | POA: Insufficient documentation

## 2018-10-25 DIAGNOSIS — Z794 Long term (current) use of insulin: Secondary | ICD-10-CM | POA: Diagnosis not present

## 2018-10-25 DIAGNOSIS — E785 Hyperlipidemia, unspecified: Secondary | ICD-10-CM | POA: Insufficient documentation

## 2018-10-25 DIAGNOSIS — Z79899 Other long term (current) drug therapy: Secondary | ICD-10-CM | POA: Insufficient documentation

## 2018-10-25 DIAGNOSIS — J209 Acute bronchitis, unspecified: Secondary | ICD-10-CM | POA: Insufficient documentation

## 2018-10-25 DIAGNOSIS — E11649 Type 2 diabetes mellitus with hypoglycemia without coma: Secondary | ICD-10-CM | POA: Diagnosis not present

## 2018-10-25 DIAGNOSIS — R4182 Altered mental status, unspecified: Secondary | ICD-10-CM | POA: Diagnosis present

## 2018-10-25 DIAGNOSIS — I10 Essential (primary) hypertension: Secondary | ICD-10-CM | POA: Insufficient documentation

## 2018-10-25 HISTORY — DX: Other psychoactive substance abuse, uncomplicated: F19.10

## 2018-10-25 LAB — CBC
HCT: 42.6 % (ref 39.0–52.0)
Hemoglobin: 14.2 g/dL (ref 13.0–17.0)
MCH: 32.3 pg (ref 26.0–34.0)
MCHC: 33.3 g/dL (ref 30.0–36.0)
MCV: 97 fL (ref 80.0–100.0)
Platelets: 338 10*3/uL (ref 150–400)
RBC: 4.39 MIL/uL (ref 4.22–5.81)
RDW: 12 % (ref 11.5–15.5)
WBC: 12.4 10*3/uL — ABNORMAL HIGH (ref 4.0–10.5)
nRBC: 0 % (ref 0.0–0.2)

## 2018-10-25 LAB — COMPREHENSIVE METABOLIC PANEL
ALBUMIN: 3.5 g/dL (ref 3.5–5.0)
ALT: 15 U/L (ref 0–44)
AST: 27 U/L (ref 15–41)
Alkaline Phosphatase: 55 U/L (ref 38–126)
Anion gap: 7 (ref 5–15)
BUN: 19 mg/dL (ref 6–20)
CHLORIDE: 105 mmol/L (ref 98–111)
CO2: 25 mmol/L (ref 22–32)
Calcium: 8.4 mg/dL — ABNORMAL LOW (ref 8.9–10.3)
Creatinine, Ser: 0.82 mg/dL (ref 0.61–1.24)
GFR calc Af Amer: >60 mL/min (ref >60)
GFR calc non Af Amer: >60 mL/min (ref >60)
Glucose, Bld: 176 mg/dL — ABNORMAL HIGH (ref 70–99)
Potassium: 3.2 mmol/L — ABNORMAL LOW (ref 3.5–5.1)
Sodium: 137 mmol/L (ref 135–145)
Total Bilirubin: 0.5 mg/dL (ref 0.3–1.2)
Total Protein: 6.4 g/dL — ABNORMAL LOW (ref 6.5–8.1)

## 2018-10-25 LAB — GLUCOSE, CAPILLARY
Glucose-Capillary: 416 mg/dL — ABNORMAL HIGH (ref 70–99)
Glucose-Capillary: 161 mg/dL — ABNORMAL HIGH (ref 70–99)
Glucose-Capillary: 115 mg/dL — ABNORMAL HIGH (ref 70–99)
Glucose-Capillary: 17 mg/dL — CL (ref 70–99)
Glucose-Capillary: 121 mg/dL — ABNORMAL HIGH (ref 70–99)
Glucose-Capillary: 261 mg/dL — ABNORMAL HIGH (ref 70–99)

## 2018-10-25 LAB — HEMOGLOBIN A1C
HEMOGLOBIN A1C: 9.9 % — AB (ref 4.8–5.6)
Mean Plasma Glucose: 237.43 mg/dL

## 2018-10-25 LAB — PROCALCITONIN: Procalcitonin: 0.17 ng/mL

## 2018-10-25 LAB — HIV ANTIBODY (ROUTINE TESTING W REFLEX): HIV Screen 4th Generation wRfx: NONREACTIVE

## 2018-10-25 MED ORDER — INSULIN ASPART 100 UNIT/ML ~~LOC~~ SOLN
0.0000 [IU] | Freq: Three times a day (TID) | SUBCUTANEOUS | Status: DC
  Administered 2018-10-26 (×2): 2 [IU] via SUBCUTANEOUS
  Administered 2018-10-26: 9 [IU] via SUBCUTANEOUS
  Administered 2018-10-27: 3 [IU] via SUBCUTANEOUS
  Filled 2018-10-25 (×3): qty 1

## 2018-10-25 MED ORDER — LEVOFLOXACIN IN D5W 750 MG/150ML IV SOLN
750.0000 mg | INTRAVENOUS | Status: DC
  Filled 2018-10-25: qty 150

## 2018-10-25 MED ORDER — ATORVASTATIN CALCIUM 10 MG PO TABS
10.0000 mg | ORAL_TABLET | Freq: Every day | ORAL | Status: DC
  Administered 2018-10-25 – 2018-10-26 (×2): 10 mg via ORAL
  Filled 2018-10-25 (×2): qty 1

## 2018-10-25 MED ORDER — SODIUM CHLORIDE 0.9 % IV SOLN
INTRAVENOUS | Status: DC
  Administered 2018-10-25: 13:00:00 via INTRAVENOUS

## 2018-10-25 MED ORDER — GLUCOSE 40 % PO GEL
ORAL | Status: AC
  Administered 2018-10-25: 37.5 g
  Filled 2018-10-25: qty 1

## 2018-10-25 MED ORDER — DEXTROSE 10 % IV SOLN
INTRAVENOUS | Status: DC
  Administered 2018-10-25: 18:00:00 via INTRAVENOUS

## 2018-10-25 MED ORDER — INFLUENZA VAC SPLIT HIGH-DOSE 0.5 ML IM SUSY
0.5000 mL | PREFILLED_SYRINGE | INTRAMUSCULAR | Status: AC
  Administered 2018-10-26: 0.5 mL via INTRAMUSCULAR
  Filled 2018-10-25: qty 0.5

## 2018-10-25 MED ORDER — GABAPENTIN 300 MG PO CAPS
600.0000 mg | ORAL_CAPSULE | Freq: Three times a day (TID) | ORAL | Status: DC
  Administered 2018-10-25 – 2018-10-27 (×6): 600 mg via ORAL
  Filled 2018-10-25 (×6): qty 2

## 2018-10-25 MED ORDER — LEVOFLOXACIN IN D5W 250 MG/50ML IV SOLN
250.0000 mg | INTRAVENOUS | Status: DC
  Filled 2018-10-25: qty 50

## 2018-10-25 MED ORDER — LEVOFLOXACIN IN D5W 750 MG/150ML IV SOLN
750.0000 mg | Freq: Once | INTRAVENOUS | Status: AC
  Administered 2018-10-25: 750 mg via INTRAVENOUS
  Filled 2018-10-25: qty 150

## 2018-10-25 MED ORDER — ENOXAPARIN SODIUM 40 MG/0.4ML ~~LOC~~ SOLN
40.0000 mg | SUBCUTANEOUS | Status: DC
  Administered 2018-10-25 – 2018-10-26 (×2): 40 mg via SUBCUTANEOUS
  Filled 2018-10-25 (×2): qty 0.4

## 2018-10-25 MED ORDER — ACETAMINOPHEN 325 MG PO TABS: 650.0000 mg | ORAL_TABLET | Freq: Four times a day (QID) | ORAL | Status: DC | PRN

## 2018-10-25 MED ORDER — TRAZODONE HCL 50 MG PO TABS
50.0000 mg | ORAL_TABLET | Freq: Every day | ORAL | Status: DC
  Administered 2018-10-25 – 2018-10-26 (×2): 50 mg via ORAL
  Filled 2018-10-25 (×2): qty 1

## 2018-10-25 MED ORDER — ACETAMINOPHEN 650 MG RE SUPP: 650.0000 mg | Freq: Four times a day (QID) | RECTAL | Status: DC | PRN

## 2018-10-25 MED ORDER — ONDANSETRON HCL 4 MG/2ML IJ SOLN: 4.0000 mg | Freq: Four times a day (QID) | INTRAMUSCULAR | Status: DC | PRN

## 2018-10-25 MED ORDER — ONDANSETRON HCL 4 MG PO TABS
4.0000 mg | ORAL_TABLET | Freq: Four times a day (QID) | ORAL | Status: DC | PRN
  Filled 2018-10-25: qty 1
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
## 2018-10-25 NOTE — ED Provider Notes (Signed)
[facility] Emergency Department Provider Note   ____________________________________________    I have reviewed the triage vital signs and the nursing notes.   HISTORY  Chief Complaint Hypoglycemia and Altered Mental Status     HPI Spencer [name] is a 57 y.o. male who presents with hypoglycemia, confusion.  Patient was apparently found altered, EMS found his glucose to be 25.  Patient does not remember what happened.  He does have a history of diabetes and reports he is compliant with his insulin.  Review of medical records demonstrates that he was recently admitted and discharged for hypoglycemia.  2 days ago he was admitted with a fever.   Past Medical History:  Diagnosis Date  . Brain aneurysm   . Diabetes mellitus without complication (HCC)   . Hypertension   . Substance abuse [facility])     Patient Active Problem List   Diagnosis Date Noted  . Hypoglycemia 10/23/2018  . Chronic low back pain 01/16/2017  . Chronic neck pain 01/16/2017  . Diabetic neuropathy (HCC) 01/16/2017  . DM2 (diabetes mellitus, type 2) (HCC) 01/16/2017  . History of cocaine abuse (HCC) 01/16/2017  . Personal history of subdural hematoma 01/16/2017  . Closed displaced fracture of body of left calcaneus with delayed healing 12/13/2016  . Protein-calorie malnutrition, severe 11/08/2016  . DKA, type 2 (HCC) 11/06/2016  . Diabetic ketoacidosis (HCC) 04/03/2015  . DKA (diabetic ketoacidoses) (HCC) 04/03/2015  . Hypertension 04/03/2015  . Depression 04/03/2015  . Opiate dependence (HCC) 04/03/2015  . Tobacco abuse 04/03/2015  . Lumbosacral neuritis 07/19/2014  . Pain of finger of right hand 07/19/2014  . Type II or unspecified type diabetes mellitus without mention of complication, not stated as uncontrolled 07/19/2014  . Knee pain 09/12/2013  . Hernia of flank 09/20/2012  . Epidermoid cyst of skin 09/20/2012  . Chronic pain of both shoulders 03/27/2012    Past  Surgical History:  Procedure Laterality Date  . HERNIA REPAIR    . NECK SURGERY      Prior to Admission medications   Medication Sig Start Date End Date Taking? Authorizing Provider  atorvastatin (LIPITOR) 10 MG tablet Take 10 mg by mouth daily.    [provider]  B-D INS SYR ULTRAFINE 1CC/31G 31G X 5/16" 1 ML MISC  01/03/17   [provider]  cyclobenzaprine (FLEXERIL) 10 MG tablet Take 1 tablet (10 mg total) by mouth 3 (three) times daily as needed. 02/13/18   Joni Reining, PA-C  feeding supplement, GLUCERNA SHAKE, (GLUCERNA SHAKE) LIQD Take 237 mLs by mouth 3 (three) times daily between meals. 11/09/16   Elgergawy, Leana Roe, MD  gabapentin (NEURONTIN) 300 MG capsule Take 600 mg by mouth 3 (three) times daily. 01/03/17   [provider]  insulin aspart (NOVOLOG) 100 UNIT/ML injection Inject 0-5 Units into the skin at bedtime. Patient taking differently: Inject 0-20 Units into the skin at bedtime.  01/18/17   Gouru, Deanna Artis, MD  insulin glargine (LANTUS) 100 UNIT/ML injection Inject 0.47 mLs (47 Units total) into the skin daily. Patient taking differently: Inject 120 Units into the skin daily.  01/19/17   Ramonita Lab, MD  levofloxacin (LEVAQUIN) 500 MG tablet Take 1 tablet (500 mg total) by mouth daily for 4 days. 10/24/18 10/28/18  Ihor Austin, MD  metFORMIN (GLUCOPHAGE-XR) 500 MG 24 hr tablet Take 1,000 mg by mouth every evening. 12/12/16 10/23/18  [provider]  pantoprazole (PROTONIX) 40 MG tablet Take 40 mg by mouth  daily.    [provider]  traZODone (DESYREL) 100 MG tablet take 1 tablet by mouth at bedtime 1 hour before BED 01/19/17   [provider]     Allergies Patient has no known allergies.  Family History  Problem Relation Age of Onset  . CAD Sister   . Hypertension Sister     Social History Social History   Tobacco Use  . Smoking status: Current Every Day Smoker    Packs/day: 0.50    Years: 34.00    Pack years:  17.00    Types: Cigarettes  . Smokeless tobacco: Never Used  Substance Use Topics  . Alcohol use: Yes    Alcohol/week: 1.0 standard drinks    Types: 1 Cans of beer per week  . Drug use: Yes    Types: Cocaine    Review of Systems  Constitutional: No fever/chills Eyes: No visual changes.  ENT: No sore throat. Cardiovascular: Denies chest pain. Respiratory: Denies shortness of breath.  Positive cough Gastrointestinal: No abdominal pain.  No nausea, no vomiting.   Genitourinary: Negative for dysuria. Musculoskeletal: Negative for back pain. Skin: Negative for rash. Neurological: Negative for headaches   ____________________________________________   PHYSICAL EXAM:  VITAL SIGNS: ED Triage Vitals  Enc Vitals Group     BP 10/25/18 1038 123/81     Pulse Rate 10/25/18 1038 67     Resp 10/25/18 1038 18     Temp 10/25/18 1038 97.7 F (36.5 C)     Temp Source 10/25/18 1038 Oral     SpO2 10/25/18 1038 97 %     Weight 10/25/18 1040 72.6 kg (160 lb)     Height 10/25/18 1040 1.88 m (6\' 2" )     Head Circumference --      Peak Flow --      Pain Score 10/25/18 1039 0     Pain Loc --      Pain Edu? --      Excl. in GC? --     Constitutional: Alert and oriented.  Irritable Eyes: Conjunctivae are normal.  Head: Atraumatic. Nose: No congestion/rhinnorhea. Mouth/Throat: Mucous membranes are moist.    Cardiovascular: Normal rate, regular rhythm. Grossly normal heart sounds.  Good peripheral circulation. Respiratory: Normal respiratory effort.  No retractions. Lungs CTAB.  Musculoskeletal: No lower extremity tenderness nor edema.  Warm and well perfused Neurologic:  Normal speech and language. No gross focal neurologic deficits are appreciated.  Skin:  Skin is warm, dry and intact. No rash noted. Psychiatric: Mood and affect are normal. Speech and behavior are normal.  ____________________________________________   LABS (all labs ordered are listed, but only abnormal results  are displayed)  Labs Reviewed  COMPREHENSIVE METABOLIC PANEL - Abnormal; Notable for the following components:      Result Value   Potassium 3.2 (*)    Glucose, Bld 176 (*)    Calcium 8.4 (*)    Total Protein 6.4 (*)    All other components within normal limits  CBC - Abnormal; Notable for the following components:   WBC 12.4 (*)    All other components within normal limits  GLUCOSE, CAPILLARY - Abnormal; Notable for the following components:   Glucose-Capillary 161 (*)    All other components within normal limits  CULTURE, BLOOD (ROUTINE X 2)  CULTURE, BLOOD (ROUTINE X 2)  PROCALCITONIN  CBG MONITORING, ED  CBG MONITORING, ED   ____________________________________________  EKG  None ____________________________________________  RADIOLOGY  Chest x-ray concerning for pneumonia ____________________________________________  PROCEDURES  Procedure(s) performed: No  Procedures   Critical Care performed: No ____________________________________________   INITIAL IMPRESSION / ASSESSMENT AND PLAN / ED COURSE  Pertinent labs & imaging results that were available during my care of the patient were reviewed by me and considered in my medical decision making (see chart for details).  Patient presents with altered mental status status post hypoglycemia, received glucose via EMS.  Glucose is 176 in the emergency department.  The patient does not remember what happened.  Recent admission, medical records reviewed.  There was some concern about pneumonia and he was febrile the other day, not today.  Chest x-ray here confirms pneumonia.  Will give additional dose of Levaquin, this is likely the cause of his hypoglycemia, given frequent drops in sugar will admit to the hospitalist    ____________________________________________   FINAL CLINICAL IMPRESSION(S) / ED DIAGNOSES  Final diagnoses:  Hypoglycemia  Community acquired pneumonia, unspecified laterality         Note:  This document was prepared using Dragon voice recognition software and may include unintentional dictation errors.    Jene EveryKinner, Anneka Studer, MD 10/25/18 [phone]

## 2018-10-25 NOTE — ED Notes (Signed)
Patient transported to X-ray 
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
## 2018-10-25 NOTE — Plan of Care (Signed)
  Problem: Education: Goal: Knowledge of General Education information will improve Description Including pain rating scale, medication(s)/side effects and non-pharmacologic comfort measures Outcome: Progressing   Problem: Clinical Measurements: Goal: Will remain free from infection Outcome: Progressing Goal: Respiratory complications will improve Outcome: Progressing Goal: Cardiovascular complication will be avoided Outcome: Progressing   Problem: Activity: Goal: Risk for activity intolerance will decrease Outcome: Progressing   Problem: Nutrition: Goal: Adequate nutrition will be maintained Outcome: Progressing   Problem: Coping: Goal: Level of anxiety will decrease Outcome: Progressing   Problem: Elimination: Goal: Will not experience complications related to bowel motility Outcome: Progressing   Problem: Pain Managment: Goal: General experience of comfort will improve Outcome: Progressing   Problem: Safety: Goal: Ability to remain free from injury will improve Outcome: Progressing   Problem: Skin Integrity: Goal: Risk for impaired skin integrity will decrease Outcome: Progressing   

## 2018-10-25 NOTE — ED Notes (Signed)
Lab notified to add on procalcitonin level to previous blood draw, spoke with Lamont.
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
## 2018-10-25 NOTE — Progress Notes (Addendum)
Patient arrived from ED at 1355 with a blood sugar of 115.  At 1647 blood sugar was 17. Notified Dr. Allena KatzPatel who ordered D5 at 13900mL/hr. At 1740 blood sugar was 121. Continue to monitor patient status.

## 2018-10-26 LAB — GLUCOSE, CAPILLARY
GLUCOSE-CAPILLARY: 194 mg/dL — AB (ref 70–99)
GLUCOSE-CAPILLARY: 150 mg/dL — AB (ref 70–99)
Glucose-Capillary: 283 mg/dL — ABNORMAL HIGH (ref 70–99)
Glucose-Capillary: 253 mg/dL — ABNORMAL HIGH (ref 70–99)
Glucose-Capillary: 402 mg/dL — ABNORMAL HIGH (ref 70–99)
Glucose-Capillary: 152 mg/dL — ABNORMAL HIGH (ref 70–99)
Glucose-Capillary: 34 mg/dL — CL (ref 70–99)

## 2018-10-26 MED ORDER — LEVOFLOXACIN 500 MG PO TABS: 750.0000 mg | ORAL_TABLET | Freq: Every day | ORAL | Status: DC

## 2018-10-26 MED ORDER — LEVOFLOXACIN 500 MG PO TABS
500.0000 mg | ORAL_TABLET | Freq: Every day | ORAL | Status: DC
  Administered 2018-10-26 – 2018-10-27 (×2): 500 mg via ORAL
  Filled 2018-10-26 (×2): qty 1

## 2018-10-26 MED ORDER — INSULIN ASPART 100 UNIT/ML ~~LOC~~ SOLN
5.0000 [IU] | Freq: Three times a day (TID) | SUBCUTANEOUS | Status: DC
  Administered 2018-10-26 (×2): 5 [IU] via SUBCUTANEOUS
  Filled 2018-10-26: qty 1

## 2018-10-26 MED ORDER — INSULIN GLARGINE 100 UNIT/ML ~~LOC~~ SOLN
30.0000 [IU] | Freq: Every day | SUBCUTANEOUS | Status: DC
  Administered 2018-10-26: 30 [IU] via SUBCUTANEOUS
  Filled 2018-10-26 (×2): qty 0.3
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
## 2018-10-26 NOTE — Plan of Care (Signed)
  Problem: Education: Goal: Knowledge of General Education information will improve Description Including pain rating scale, medication(s)/side effects and non-pharmacologic comfort measures Outcome: Progressing   Problem: Clinical Measurements: Goal: Will remain free from infection Outcome: Progressing Goal: Respiratory complications will improve Outcome: Progressing Goal: Cardiovascular complication will be avoided Outcome: Progressing   Problem: Activity: Goal: Risk for activity intolerance will decrease Outcome: Progressing   Problem: Nutrition: Goal: Adequate nutrition will be maintained Outcome: Progressing   Problem: Coping: Goal: Level of anxiety will decrease Outcome: Progressing   Problem: Elimination: Goal: Will not experience complications related to bowel motility Outcome: Progressing   Problem: Pain Managment: Goal: General experience of comfort will improve Outcome: Progressing   Problem: Safety: Goal: Ability to remain free from injury will improve Outcome: Progressing   Problem: Skin Integrity: Goal: Risk for impaired skin integrity will decrease Outcome: Progressing

## 2018-10-26 NOTE — Plan of Care (Signed)
  Problem: Education: Goal: Knowledge of General Education information will improve Description Including pain rating scale, medication(s)/side effects and non-pharmacologic comfort measures Outcome: Progressing   Problem: Clinical Measurements: Goal: Will remain free from infection Outcome: Progressing Goal: Respiratory complications will improve Outcome: Progressing Goal: Cardiovascular complication will be avoided Outcome: Progressing   Problem: Activity: Goal: Risk for activity intolerance will decrease Outcome: Progressing   Problem: Nutrition: Goal: Adequate nutrition will be maintained Outcome: Progressing   Problem: Coping: Goal: Level of anxiety will decrease Outcome: Progressing   Problem: Elimination: Goal: Will not experience complications related to bowel motility Outcome: Progressing   Problem: Pain Managment: Goal: General experience of comfort will improve Outcome: Progressing   Problem: Safety: Goal: Ability to remain free from injury will improve Outcome: Progressing   Problem: Skin Integrity: Goal: Risk for impaired skin integrity will decrease Outcome: Progressing   

## 2018-10-27 LAB — GLUCOSE, CAPILLARY
GLUCOSE-CAPILLARY: 188 mg/dL — AB (ref 70–99)
Glucose-Capillary: 61 mg/dL — ABNORMAL LOW (ref 70–99)
Glucose-Capillary: 240 mg/dL — ABNORMAL HIGH (ref 70–99)

## 2018-10-27 MED ORDER — INSULIN GLARGINE 100 UNIT/ML ~~LOC~~ SOLN: 16.0000 [IU] | Freq: Two times a day (BID) | SUBCUTANEOUS | 0 refills | Status: DC

## 2018-10-27 MED ORDER — INSULIN GLARGINE 100 UNIT/ML ~~LOC~~ SOLN
16.0000 [IU] | Freq: Two times a day (BID) | SUBCUTANEOUS | Status: DC
  Administered 2018-10-27: 16 [IU] via SUBCUTANEOUS
  Filled 2018-10-27 (×2): qty 0.16

## 2018-10-27 MED ORDER — INSULIN GLARGINE 100 UNIT/ML ~~LOC~~ SOLN: 15.0000 [IU] | Freq: Two times a day (BID) | SUBCUTANEOUS | Status: DC

## 2018-10-27 NOTE — Progress Notes (Signed)
Blood glucose checked and found to be 61, patient asymptomatic. Patient will be given orange juice and peanut butter crackers and will recheck blood glucose in an hour.

## 2018-10-27 NOTE — Progress Notes (Signed)
Patient's blood glucose rechecked and is at 150.

## 2018-10-27 NOTE — Progress Notes (Signed)
Dr Allena KatzPatel at bedside. Pt verbalizes understanding to change at home lantus to 16 units BID. Pt educated to call and set up a ride for discharge. Pt verbalizes understanding.

## 2018-10-27 NOTE — Progress Notes (Signed)
Notified by nurse tech patient's blood sugar was 34.  Patient given 2 cups of orange juice, 8 ounces, and peanut butter crackers.  Blood sugar will be repeated in one hour.

## 2018-10-27 NOTE — Progress Notes (Signed)
Blood glucose is 250.  Will have morning nurse report this to MD.
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
## 2018-10-27 NOTE — Progress Notes (Signed)
Nsg Discharge Note  Admit Date:  10/25/2018 Discharge date: 10/27/2018   Clydell Hakimoger A Pouncey to be D/C'd Home per MD order.  AVS completed.  Copy for chart, and copy for patient signed, and dated. Patient/caregiver able to verbalize understanding.  Discharge Medication: Allergies as of 10/27/2018   No Known Allergies     Medication List    STOP taking these medications   feeding supplement (GLUCERNA SHAKE) Liqd   metFORMIN 500 MG 24 hr tablet Commonly known as:  GLUCOPHAGE-XR     TAKE these medications   atorvastatin 10 MG tablet Commonly known as:  LIPITOR Take 10 mg by mouth daily.   B-D INS SYR ULTRAFINE 1CC/31G 31G X 5/16" 1 ML Misc Generic drug:  Insulin Syringe-Needle U-100   cyclobenzaprine 10 MG tablet Commonly known as:  FLEXERIL Take 1 tablet (10 mg total) by mouth 3 (three) times daily as needed.   gabapentin 300 MG capsule Commonly known as:  NEURONTIN Take 600 mg by mouth 3 (three) times daily.   insulin aspart 100 UNIT/ML injection Commonly known as:  novoLOG Inject 0-5 Units into the skin at bedtime. What changed:  how much to take   insulin glargine 100 UNIT/ML injection Commonly known as:  LANTUS Inject 0.16 mLs (16 Units total) into the skin 2 (two) times daily. What changed:    how much to take  when to take this   levofloxacin 500 MG tablet Commonly known as:  LEVAQUIN Take 1 tablet (500 mg total) by mouth daily for 4 days. Notes to patient:  Make sure to take all this medication.    pantoprazole 40 MG tablet Commonly known as:  PROTONIX Take 40 mg by mouth daily.   traZODone 100 MG tablet Commonly known as:  DESYREL take 1 tablet by mouth at bedtime 1 hour before BED       Discharge Assessment: Vitals:   10/26/18 1633 10/26/18 2309  BP: (!) 160/92 124/71  Pulse: 86 70  Resp: 18 18  Temp: 98.7 F (37.1 C) 98.4 F (36.9 C)  SpO2: 98% 98%   Skin clean, dry and intact without evidence of skin break down, no evidence of skin tears  noted. IV catheter discontinued intact. Site without signs and symptoms of complications - no redness or edema noted at insertion site, patient denies c/o pain - only slight tenderness at site.  Dressing with slight pressure applied.  D/c Instructions-Education: Discharge instructions given to patient/family with verbalized understanding. D/c education completed with patient/family including follow up instructions, medication list, d/c activities limitations if indicated, with other d/c instructions as indicated by MD - patient able to verbalize understanding, all questions fully answered. Patient instructed to return to ED, call 911, or call MD for any changes in condition.  Patient escorted via WC, and D/C home via private auto.  Adair LaundryElizabeth A Jahnasia Tatum, RN 10/27/2018 8:05 AM

## 2018-10-28 LAB — CULTURE, BLOOD (ROUTINE X 2)
Culture: NO GROWTH
Culture: NO GROWTH

## 2018-10-30 ENCOUNTER — Emergency Department
Admission: EM | Admit: 2018-10-30 | Discharge: 2018-10-30 | Disposition: A | Discharge status: Home / Self Care | Payer: Medicaid Other | Attending: Emergency Medicine | Admitting: Emergency Medicine

## 2018-10-30 ENCOUNTER — Other Ambulatory Visit: Payer: Self-pay

## 2018-10-30 DIAGNOSIS — Z794 Long term (current) use of insulin: Secondary | ICD-10-CM | POA: Insufficient documentation

## 2018-10-30 DIAGNOSIS — E11649 Type 2 diabetes mellitus with hypoglycemia without coma: Secondary | ICD-10-CM | POA: Insufficient documentation

## 2018-10-30 DIAGNOSIS — E162 Hypoglycemia, unspecified: Secondary | ICD-10-CM | POA: Diagnosis present

## 2018-10-30 DIAGNOSIS — E1140 Type 2 diabetes mellitus with diabetic neuropathy, unspecified: Secondary | ICD-10-CM | POA: Insufficient documentation

## 2018-10-30 DIAGNOSIS — F17210 Nicotine dependence, cigarettes, uncomplicated: Secondary | ICD-10-CM | POA: Diagnosis not present

## 2018-10-30 DIAGNOSIS — Z79899 Other long term (current) drug therapy: Secondary | ICD-10-CM | POA: Diagnosis not present

## 2018-10-30 DIAGNOSIS — R4781 Slurred speech: Secondary | ICD-10-CM | POA: Insufficient documentation

## 2018-10-30 DIAGNOSIS — I10 Essential (primary) hypertension: Secondary | ICD-10-CM | POA: Insufficient documentation

## 2018-10-30 DIAGNOSIS — F1410 Cocaine abuse, uncomplicated: Secondary | ICD-10-CM | POA: Insufficient documentation

## 2018-10-30 LAB — COMPREHENSIVE METABOLIC PANEL
ALK PHOS: 69 U/L (ref 38–126)
ALT: 18 U/L (ref 0–44)
ANION GAP: 9 (ref 5–15)
AST: 25 U/L (ref 15–41)
Albumin: 4.3 g/dL (ref 3.5–5.0)
BUN: 22 mg/dL — ABNORMAL HIGH (ref 6–20)
CALCIUM: 9.6 mg/dL (ref 8.9–10.3)
CO2: 27 mmol/L (ref 22–32)
Chloride: 105 mmol/L (ref 98–111)
Creatinine, Ser: 0.81 mg/dL (ref 0.61–1.24)
GFR calc Af Amer: >60 mL/min (ref >60)
GFR calc non Af Amer: >60 mL/min (ref >60)
Glucose, Bld: 42 mg/dL — CL (ref 70–99)
Potassium: 3.7 mmol/L (ref 3.5–5.1)
Sodium: 141 mmol/L (ref 135–145)
Total Bilirubin: 0.6 mg/dL (ref 0.3–1.2)
Total Protein: 7.9 g/dL (ref 6.5–8.1)

## 2018-10-30 LAB — TROPONIN I: Troponin I: <0.03 ng/mL (ref <0.03)

## 2018-10-30 LAB — CULTURE, BLOOD (ROUTINE X 2)
Culture: NO GROWTH
Culture: NO GROWTH
Special Requests: ADEQUATE

## 2018-10-30 LAB — CBC
HCT: 49.9 % (ref 39.0–52.0)
Hemoglobin: 16.2 g/dL (ref 13.0–17.0)
MCH: 32 pg (ref 26.0–34.0)
MCHC: 32.5 g/dL (ref 30.0–36.0)
MCV: 98.6 fL (ref 80.0–100.0)
PLATELETS: 453 10*3/uL — AB (ref 150–400)
RBC: 5.06 MIL/uL (ref 4.22–5.81)
RDW: 12.4 % (ref 11.5–15.5)
WBC: 16.2 10*3/uL — ABNORMAL HIGH (ref 4.0–10.5)
nRBC: 0 % (ref 0.0–0.2)

## 2018-10-30 LAB — GLUCOSE, CAPILLARY
Glucose-Capillary: 195 mg/dL — ABNORMAL HIGH (ref 70–99)
Glucose-Capillary: 168 mg/dL — ABNORMAL HIGH (ref 70–99)
Glucose-Capillary: 249 mg/dL — ABNORMAL HIGH (ref 70–99)
Glucose-Capillary: 249 mg/dL — ABNORMAL HIGH (ref 70–99)

## 2018-10-30 MED ORDER — DEXTROSE 50 % IV SOLN
INTRAVENOUS | Status: AC
  Filled 2018-10-30: qty 50

## 2018-10-30 MED ORDER — DEXTROSE 50 % IV SOLN
1.0000 | Freq: Once | INTRAVENOUS | Status: AC
  Administered 2018-10-30: 50 mL via INTRAVENOUS

## 2018-10-30 NOTE — ED Notes (Signed)
EKG performed.

## 2018-10-30 NOTE — ED Provider Notes (Signed)
[facility] Emergency Department Provider Note  ____________________________________________   First MD Initiated Contact with Patient 10/30/18 1422     (approximate)  I have reviewed the triage vital signs and the nursing notes.   HISTORY  Chief Complaint Altered Mental Status and Hypoglycemia   HPI Spencer [name] is a 57 y.o. male with a history of a brain aneurysm, diabetes and hypertension was presenting to the emergency department today with an episode of hypoglycemia.  Patient with multiple episodes of hypoglycemia lately.  However, most recently admitted for hypoglycemia thought to be secondary to pneumonia.  This morning, patient sister says that he took his insulin but then did not eat anything.  He had a small amount of coffee with a small man of sugar in it.  Then started slurring his words and making strange motions with his hands.  Patient was brought to the emergency department here where he was found to have a glucose of 29.  Given D50 and now is at the baseline mental status.  Denies any pain.  States that he has been taking his insulin as prescribed.   Past Medical History:  Diagnosis Date  . Brain aneurysm   . Diabetes mellitus without complication (HCC)   . Hypertension   . Substance abuse [facility])     Patient Active Problem List   Diagnosis Date Noted  . Hypoglycemia 10/23/2018  . Chronic low back pain 01/16/2017  . Chronic neck pain 01/16/2017  . Diabetic neuropathy (HCC) 01/16/2017  . DM2 (diabetes mellitus, type 2) (HCC) 01/16/2017  . History of cocaine abuse (HCC) 01/16/2017  . Personal history of subdural hematoma 01/16/2017  . Closed displaced fracture of body of left calcaneus with delayed healing 12/13/2016  . Protein-calorie malnutrition, severe 11/08/2016  . DKA, type 2 (HCC) 11/06/2016  . Diabetic ketoacidosis (HCC) 04/03/2015  . DKA (diabetic ketoacidoses) (HCC) 04/03/2015  . Hypertension 04/03/2015  . Depression  04/03/2015  . Opiate dependence (HCC) 04/03/2015  . Tobacco abuse 04/03/2015  . Lumbosacral neuritis 07/19/2014  . Pain of finger of right hand 07/19/2014  . Type II or unspecified type diabetes mellitus without mention of complication, not stated as uncontrolled 07/19/2014  . Knee pain 09/12/2013  . Hernia of flank 09/20/2012  . Epidermoid cyst of skin 09/20/2012  . Chronic pain of both shoulders 03/27/2012    Past Surgical History:  Procedure Laterality Date  . HERNIA REPAIR    . NECK SURGERY      Prior to Admission medications   Medication Sig Start Date End Date Taking? Authorizing Provider  atorvastatin (LIPITOR) 10 MG tablet Take 10 mg by mouth daily.    [provider]  B-D INS SYR ULTRAFINE 1CC/31G 31G X 5/16" 1 ML MISC  01/03/17   [provider]  cyclobenzaprine (FLEXERIL) 10 MG tablet Take 1 tablet (10 mg total) by mouth 3 (three) times daily as needed. 02/13/18   Joni Reining, PA-C  gabapentin (NEURONTIN) 300 MG capsule Take 600 mg by mouth 3 (three) times daily. 01/03/17   [provider]  insulin aspart (NOVOLOG) 100 UNIT/ML injection Inject 0-5 Units into the skin at bedtime. Patient taking differently: Inject 0-20 Units into the skin at bedtime.  01/18/17   Gouru, Deanna Artis, MD  insulin glargine (LANTUS) 100 UNIT/ML injection Inject 0.16 mLs (16 Units total) into the skin 2 (two) times daily. 10/27/18   Enedina Finner, MD  pantoprazole (PROTONIX) 40 MG tablet Take 40 mg by mouth daily.  [provider]  traZODone (DESYREL) 100 MG tablet take 1 tablet by mouth at bedtime 1 hour before BED 01/19/17   [provider]    Allergies Patient has no known allergies.  Family History  Problem Relation Age of Onset  . CAD Sister   . Hypertension Sister     Social History Social History   Tobacco Use  . Smoking status: Current Every Day Smoker    Packs/day: 0.50    Years: 34.00    Pack years: 17.00    Types: Cigarettes  .  Smokeless tobacco: Never Used  Substance Use Topics  . Alcohol use: Yes    Alcohol/week: 1.0 standard drinks    Types: 1 Cans of beer per week  . Drug use: Yes    Types: Cocaine    Review of Systems  Constitutional: No fever/chills Eyes: No visual changes. ENT: No sore throat. Cardiovascular: Denies chest pain. Respiratory: Denies shortness of breath. Gastrointestinal: No abdominal pain.  No nausea, no vomiting.  No diarrhea.  No constipation. Genitourinary: Negative for dysuria. Musculoskeletal: Negative for back pain. Skin: Negative for rash. Neurological: Negative for headaches, focal weakness or numbness.   ____________________________________________   PHYSICAL EXAM:  VITAL SIGNS: ED Triage Vitals  Enc Vitals Group     BP 10/30/18 1409 110/78     Pulse Rate 10/30/18 1409 80     Resp 10/30/18 1409 18     Temp 10/30/18 1409 98.3 F (36.8 C)     Temp Source 10/30/18 1409 Oral     SpO2 10/30/18 1409 98 %     Weight 10/30/18 1422 180 lb (81.6 kg)     Height 10/30/18 1422 6\' 2"  (1.88 m)     Head Circumference --      Peak Flow --      Pain Score 10/30/18 1422 0     Pain Loc --      Pain Edu? --      Excl. in GC? --     Constitutional: Alert and oriented. Well appearing and in no acute distress. Eyes: Conjunctivae are normal.  Head: Atraumatic. Nose: No congestion/rhinnorhea. Mouth/Throat: Mucous membranes are moist.  Neck: No stridor.   Cardiovascular: Normal rate, regular rhythm. Grossly normal heart sounds.   Respiratory: Normal respiratory effort.  No retractions. Lungs CTAB. Gastrointestinal: Soft and nontender. No distention Musculoskeletal: No lower extremity tenderness nor edema.  No joint effusions. Neurologic:  Normal speech and language. No gross focal neurologic deficits are appreciated. Skin:  Skin is warm, dry and intact. No rash noted. Psychiatric: Mood and affect are normal. Speech and behavior are  normal.  ____________________________________________   LABS (all labs ordered are listed, but only abnormal results are displayed)  Labs Reviewed  COMPREHENSIVE METABOLIC PANEL - Abnormal; Notable for the following components:      Result Value   Glucose, Bld 42 (*)    BUN 22 (*)    All other components within normal limits  CBC - Abnormal; Notable for the following components:   WBC 16.2 (*)    Platelets 453 (*)    All other components within normal limits  GLUCOSE, CAPILLARY - Abnormal; Notable for the following components:   Glucose-Capillary 195 (*)    All other components within normal limits  GLUCOSE, CAPILLARY - Abnormal; Notable for the following components:   Glucose-Capillary 29 (*)    All other components within normal limits  GLUCOSE, CAPILLARY - Abnormal; Notable for the following components:   Glucose-Capillary 168 (*)  All other components within normal limits  GLUCOSE, CAPILLARY - Abnormal; Notable for the following components:   Glucose-Capillary 249 (*)    All other components within normal limits  GLUCOSE, CAPILLARY - Abnormal; Notable for the following components:   Glucose-Capillary 249 (*)    All other components within normal limits  TROPONIN I  CBG MONITORING, ED  CBG MONITORING, ED  CBG MONITORING, ED  CBG MONITORING, ED  CBG MONITORING, ED   ____________________________________________  EKG  ED ECG REPORT I, Arelia Longestavid M Schaevitz, the attending physician, personally viewed and interpreted this ECG.   Date: 10/30/2018  EKG Time: 1421  Rate: 75  Rhythm: normal sinus rhythm  Axis: Normal  Intervals:none  ST&T Change: No ST segment elevation or depression.  Single T wave inversion in aVL. Single T wave inversion appears new.  However, no other changes. ____________________________________________  RADIOLOGY   ____________________________________________   PROCEDURES  Procedure(s) performed:   Procedures  Critical Care performed:    ____________________________________________   INITIAL IMPRESSION / ASSESSMENT AND PLAN / ED COURSE  Pertinent labs & imaging results that were available during my care of the patient were reviewed by me and considered in my medical decision making (see chart for details).  DDX: Hyporglycemia, electrolyte abnormality, kidney failure, medication change As part of my medical decision making, I reviewed the following data within the electronic MEDICAL RECORD NUMBER Notes from prior ED visits  ----------------------------------------- 6:31 PM on 10/30/2018 -----------------------------------------  Patient continues at his baseline mental status.  Has maintained his glucose after eating food.  I had consulted the diabetes education service but they are not here given that it is Christmas.  However, does appear the patient was hypoglycemic this morning secondary to not eating.  I counseled the patient as well as her sister extensively regarding making sure not to skip any meals.  They will be following up in the office.  Will be discharged at this time. ____________________________________________   FINAL CLINICAL IMPRESSION(S) / ED DIAGNOSES  Hypoglycemia.  NEW MEDICATIONS STARTED DURING THIS VISIT:  New Prescriptions   No medications on file     Note:  This document was prepared using Dragon voice recognition software and may include unintentional dictation errors.     Myrna BlazerSchaevitz, David Matthew, MD 10/30/18 [phone]

## 2018-10-30 NOTE — ED Notes (Signed)
Sister states pt took insulin around 9am, did NOT eat anything.   After amp of D50 pt is much more responsive, talking more. States no pain.

## 2018-10-30 NOTE — ED Notes (Signed)
This EDT just gave pt a meal tray per dr verbal order

## 2018-10-30 NOTE — ED Notes (Signed)
MD at bedside and aware of critical BS result from previous blood draw before D50 was given

## 2018-10-30 NOTE — ED Notes (Signed)
Only word this RN can get pt to say is "ok". Sometimes pt says it short and sometimes word is dragged out.

## 2018-10-30 NOTE — ED Notes (Signed)
Pt urinated 300cc

## 2018-10-30 NOTE — ED Notes (Signed)
Dietary called and informed of needing meal tray sent up asap

## 2018-10-30 NOTE — ED Notes (Signed)
CBG blood glucose 195. Lenise ArenaKate,RN and MD Schaevitz made aware.

## 2018-10-30 NOTE — ED Notes (Signed)
Verbal order from EDP Schaevitz to hold 2nd trop.

## 2018-10-30 NOTE — ED Notes (Signed)
Pt ate 2nd sandwich per family.

## 2018-10-30 NOTE — ED Notes (Signed)
Pt resting in bed. A&Ox4 but can be slow to answer Q's. Family at bedside.

## 2018-10-30 NOTE — ED Triage Notes (Signed)
Pt arrives to ED with sister. 10 or 10:30 went down for a nap. Woke up around 12:30 with AMS. Sister states that he has been on couch all day. Pt is humming upon arrival. Pt smile equal, no arm drift. Pt is not talking when asked. Follows command of holding arms out. Doesn't follow command to close eyes. Sister denies falls. Denies being sick other then being altered at present.

## 2018-10-30 NOTE — ED Notes (Signed)
Report given to Georgie RN 

## 2018-10-30 NOTE — ED Notes (Signed)
BG 168

## 2018-10-30 NOTE — ED Notes (Signed)
Pt given dinner tray.

## 2018-10-30 NOTE — ED Notes (Signed)
Pt ate entire meal tray and drinking juice at this time

## 2018-10-31 LAB — GLUCOSE, CAPILLARY: GLUCOSE-CAPILLARY: 29 mg/dL — AB (ref 70–99)

## 2019-01-06 ENCOUNTER — Other Ambulatory Visit: Payer: Self-pay

## 2019-01-06 ENCOUNTER — Encounter: Payer: Self-pay | Admitting: *Deleted

## 2019-01-06 ENCOUNTER — Inpatient Hospital Stay
Admission: EM | Admit: 2019-01-06 | Discharge: 2019-01-08 | DRG: 638 | Disposition: A | Discharge status: Home / Self Care | Payer: Medicaid Other | Source: Ambulatory Visit | Attending: Internal Medicine | Admitting: Internal Medicine

## 2019-01-06 DIAGNOSIS — Z91138 Patient's unintentional underdosing of medication regimen for other reason: Secondary | ICD-10-CM

## 2019-01-06 DIAGNOSIS — H6120 Impacted cerumen, unspecified ear: Secondary | ICD-10-CM | POA: Diagnosis present

## 2019-01-06 DIAGNOSIS — E1110 Type 2 diabetes mellitus with ketoacidosis without coma: Secondary | ICD-10-CM | POA: Diagnosis not present

## 2019-01-06 DIAGNOSIS — F1410 Cocaine abuse, uncomplicated: Secondary | ICD-10-CM | POA: Diagnosis present

## 2019-01-06 DIAGNOSIS — E860 Dehydration: Secondary | ICD-10-CM | POA: Diagnosis present

## 2019-01-06 DIAGNOSIS — F17210 Nicotine dependence, cigarettes, uncomplicated: Secondary | ICD-10-CM | POA: Diagnosis present

## 2019-01-06 DIAGNOSIS — K219 Gastro-esophageal reflux disease without esophagitis: Secondary | ICD-10-CM | POA: Diagnosis present

## 2019-01-06 DIAGNOSIS — I10 Essential (primary) hypertension: Secondary | ICD-10-CM | POA: Diagnosis present

## 2019-01-06 DIAGNOSIS — E1010 Type 1 diabetes mellitus with ketoacidosis without coma: Principal | ICD-10-CM | POA: Diagnosis present

## 2019-01-06 DIAGNOSIS — E785 Hyperlipidemia, unspecified: Secondary | ICD-10-CM | POA: Diagnosis present

## 2019-01-06 DIAGNOSIS — Z8249 Family history of ischemic heart disease and other diseases of the circulatory system: Secondary | ICD-10-CM | POA: Diagnosis not present

## 2019-01-06 DIAGNOSIS — T383X6A Underdosing of insulin and oral hypoglycemic [antidiabetic] drugs, initial encounter: Secondary | ICD-10-CM | POA: Diagnosis present

## 2019-01-06 DIAGNOSIS — E1040 Type 1 diabetes mellitus with diabetic neuropathy, unspecified: Secondary | ICD-10-CM | POA: Diagnosis present

## 2019-01-06 DIAGNOSIS — E871 Hypo-osmolality and hyponatremia: Secondary | ICD-10-CM | POA: Diagnosis present

## 2019-01-06 DIAGNOSIS — F1010 Alcohol abuse, uncomplicated: Secondary | ICD-10-CM | POA: Diagnosis present

## 2019-01-06 DIAGNOSIS — E875 Hyperkalemia: Secondary | ICD-10-CM | POA: Diagnosis present

## 2019-01-06 DIAGNOSIS — Z794 Long term (current) use of insulin: Secondary | ICD-10-CM

## 2019-01-06 LAB — BASIC METABOLIC PANEL
Anion gap: 17 — ABNORMAL HIGH (ref 5–15)
Anion gap: 11 (ref 5–15)
BUN: 23 mg/dL — ABNORMAL HIGH (ref 6–20)
BUN: 17 mg/dL (ref 6–20)
CHLORIDE: 88 mmol/L — AB (ref 98–111)
CO2: 20 mmol/L — ABNORMAL LOW (ref 22–32)
CO2: 23 mmol/L (ref 22–32)
Calcium: 9.7 mg/dL (ref 8.9–10.3)
Calcium: 9 mg/dL (ref 8.9–10.3)
Chloride: 105 mmol/L (ref 98–111)
Creatinine, Ser: 1.22 mg/dL (ref 0.61–1.24)
Creatinine, Ser: 0.84 mg/dL (ref 0.61–1.24)
GFR calc Af Amer: >60 mL/min (ref >60)
GFR calc Af Amer: >60 mL/min (ref >60)
GFR calc non Af Amer: >60 mL/min (ref >60)
GFR calc non Af Amer: >60 mL/min (ref >60)
Glucose, Bld: 854 mg/dL (ref 70–99)
Glucose, Bld: 155 mg/dL — ABNORMAL HIGH (ref 70–99)
Potassium: 5.2 mmol/L — ABNORMAL HIGH (ref 3.5–5.1)
Potassium: 3.6 mmol/L (ref 3.5–5.1)
Sodium: 125 mmol/L — ABNORMAL LOW (ref 135–145)
Sodium: 139 mmol/L (ref 135–145)

## 2019-01-06 LAB — URINE DRUG SCREEN, QUALITATIVE (ARMC ONLY)
Amphetamines, Ur Screen: NOT DETECTED
BARBITURATES, UR SCREEN: NOT DETECTED
Benzodiazepine, Ur Scrn: NOT DETECTED
Cannabinoid 50 Ng, Ur ~~LOC~~: NOT DETECTED
Cocaine Metabolite,Ur ~~LOC~~: POSITIVE — AB
MDMA (Ecstasy)Ur Screen: NOT DETECTED
Methadone Scn, Ur: NOT DETECTED
Opiate, Ur Screen: NOT DETECTED
Phencyclidine (PCP) Ur S: NOT DETECTED
TRICYCLIC, UR SCREEN: NOT DETECTED

## 2019-01-06 LAB — URINALYSIS, COMPLETE (UACMP) WITH MICROSCOPIC
Bacteria, UA: NONE SEEN
Bilirubin Urine: NEGATIVE
Glucose, UA: >=500 mg/dL — AB
Hgb urine dipstick: NEGATIVE
Ketones, ur: 20 mg/dL — AB
Leukocytes,Ua: NEGATIVE
Nitrite: NEGATIVE
PROTEIN: NEGATIVE mg/dL
Specific Gravity, Urine: 1.025 (ref 1.005–1.030)
Squamous Epithelial / HPF: NONE SEEN (ref 0–5)
WBC, UA: NONE SEEN WBC/hpf (ref 0–5)
pH: 5 (ref 5.0–8.0)

## 2019-01-06 LAB — BETA-HYDROXYBUTYRIC ACID: Beta-Hydroxybutyric Acid: 4.01 mmol/L — ABNORMAL HIGH (ref 0.05–0.27)

## 2019-01-06 LAB — CBC
HEMATOCRIT: 43 % (ref 39.0–52.0)
Hemoglobin: 14.6 g/dL (ref 13.0–17.0)
MCH: 31.4 pg (ref 26.0–34.0)
MCHC: 34 g/dL (ref 30.0–36.0)
MCV: 92.5 fL (ref 80.0–100.0)
Platelets: 439 10*3/uL — ABNORMAL HIGH (ref 150–400)
RBC: 4.65 MIL/uL (ref 4.22–5.81)
RDW: 11.9 % (ref 11.5–15.5)
WBC: 8.9 10*3/uL (ref 4.0–10.5)
nRBC: 0 % (ref 0.0–0.2)

## 2019-01-06 LAB — BLOOD GAS, VENOUS
Acid-Base Excess: 2.4 mmol/L — ABNORMAL HIGH (ref 0.0–2.0)
Bicarbonate: 27.2 mmol/L (ref 20.0–28.0)
O2 Saturation: 87.7 %
Patient temperature: 37
pCO2, Ven: 42 mmHg — ABNORMAL LOW (ref 44.0–60.0)
pH, Ven: 7.42 (ref 7.250–7.430)
pO2, Ven: 53 mmHg — ABNORMAL HIGH (ref 32.0–45.0)

## 2019-01-06 LAB — GLUCOSE, CAPILLARY
Glucose-Capillary: >600 mg/dL (ref 70–99)
Glucose-Capillary: >600 mg/dL (ref 70–99)
Glucose-Capillary: 458 mg/dL — ABNORMAL HIGH (ref 70–99)
Glucose-Capillary: 295 mg/dL — ABNORMAL HIGH (ref 70–99)
Glucose-Capillary: 169 mg/dL — ABNORMAL HIGH (ref 70–99)
Glucose-Capillary: 101 mg/dL — ABNORMAL HIGH (ref 70–99)
Glucose-Capillary: 132 mg/dL — ABNORMAL HIGH (ref 70–99)
Glucose-Capillary: 113 mg/dL — ABNORMAL HIGH (ref 70–99)

## 2019-01-06 LAB — ETHANOL: Alcohol, Ethyl (B): <10 mg/dL (ref <10)

## 2019-01-06 MED ORDER — INSULIN REGULAR BOLUS VIA INFUSION
0.0000 [IU] | Freq: Three times a day (TID) | INTRAVENOUS | Status: DC
  Filled 2019-01-06: qty 10

## 2019-01-06 MED ORDER — GABAPENTIN 300 MG PO CAPS
600.0000 mg | ORAL_CAPSULE | Freq: Three times a day (TID) | ORAL | Status: DC
  Administered 2019-01-06 – 2019-01-08 (×5): 600 mg via ORAL
  Filled 2019-01-06 (×5): qty 2

## 2019-01-06 MED ORDER — SODIUM CHLORIDE 0.9 % IV SOLN
INTRAVENOUS | Status: DC
  Administered 2019-01-06: 20:00:00 via INTRAVENOUS

## 2019-01-06 MED ORDER — ENOXAPARIN SODIUM 40 MG/0.4ML ~~LOC~~ SOLN
40.0000 mg | SUBCUTANEOUS | Status: DC
  Administered 2019-01-06 – 2019-01-07 (×2): 40 mg via SUBCUTANEOUS
  Filled 2019-01-06 (×2): qty 0.4

## 2019-01-06 MED ORDER — THIAMINE HCL 100 MG/ML IJ SOLN: 100.0000 mg | Freq: Every day | INTRAMUSCULAR | Status: DC

## 2019-01-06 MED ORDER — CARBAMIDE PEROXIDE 6.5 % OT SOLN
5.0000 [drp] | Freq: Two times a day (BID) | OTIC | Status: DC
  Administered 2019-01-06 – 2019-01-08 (×3): 5 [drp] via OTIC
  Filled 2019-01-06 (×2): qty 15

## 2019-01-06 MED ORDER — LORAZEPAM 2 MG/ML IJ SOLN: 1.0000 mg | Freq: Four times a day (QID) | INTRAMUSCULAR | Status: DC | PRN

## 2019-01-06 MED ORDER — ACETAMINOPHEN 650 MG RE SUPP: 650.0000 mg | Freq: Four times a day (QID) | RECTAL | Status: DC | PRN

## 2019-01-06 MED ORDER — DEXTROSE-NACL 5-0.45 % IV SOLN
INTRAVENOUS | Status: DC
  Administered 2019-01-06: 21:00:00 via INTRAVENOUS

## 2019-01-06 MED ORDER — VITAMIN B-1 100 MG PO TABS
100.0000 mg | ORAL_TABLET | Freq: Every day | ORAL | Status: DC
  Administered 2019-01-06 – 2019-01-08 (×3): 100 mg via ORAL
  Filled 2019-01-06 (×3): qty 1

## 2019-01-06 MED ORDER — SODIUM CHLORIDE 0.9 % IV BOLUS
1000.0000 mL | Freq: Once | INTRAVENOUS | Status: AC
  Administered 2019-01-06: 1000 mL via INTRAVENOUS

## 2019-01-06 MED ORDER — LORAZEPAM 1 MG PO TABS: 1.0000 mg | ORAL_TABLET | Freq: Four times a day (QID) | ORAL | Status: DC | PRN

## 2019-01-06 MED ORDER — INSULIN ASPART 100 UNIT/ML ~~LOC~~ SOLN
0.0000 [IU] | Freq: Three times a day (TID) | SUBCUTANEOUS | Status: DC
  Administered 2019-01-07: 8 [IU] via SUBCUTANEOUS
  Filled 2019-01-06: qty 1

## 2019-01-06 MED ORDER — ONDANSETRON HCL 4 MG PO TABS: 4.0000 mg | ORAL_TABLET | Freq: Four times a day (QID) | ORAL | Status: DC | PRN

## 2019-01-06 MED ORDER — AMLODIPINE BESYLATE 5 MG PO TABS
5.0000 mg | ORAL_TABLET | Freq: Every day | ORAL | Status: DC
  Administered 2019-01-06 – 2019-01-08 (×3): 5 mg via ORAL
  Filled 2019-01-06 (×3): qty 1

## 2019-01-06 MED ORDER — PANTOPRAZOLE SODIUM 40 MG PO TBEC
40.0000 mg | DELAYED_RELEASE_TABLET | Freq: Every day | ORAL | Status: DC
  Administered 2019-01-07 – 2019-01-08 (×2): 40 mg via ORAL
  Filled 2019-01-06 (×2): qty 1

## 2019-01-06 MED ORDER — FOLIC ACID 1 MG PO TABS
1.0000 mg | ORAL_TABLET | Freq: Every day | ORAL | Status: DC
  Administered 2019-01-06 – 2019-01-08 (×3): 1 mg via ORAL
  Filled 2019-01-06 (×3): qty 1

## 2019-01-06 MED ORDER — ONDANSETRON HCL 4 MG/2ML IJ SOLN: 4.0000 mg | Freq: Four times a day (QID) | INTRAMUSCULAR | Status: DC | PRN

## 2019-01-06 MED ORDER — CYCLOBENZAPRINE HCL 10 MG PO TABS
10.0000 mg | ORAL_TABLET | Freq: Three times a day (TID) | ORAL | Status: DC | PRN
  Filled 2019-01-06: qty 1

## 2019-01-06 MED ORDER — ADULT MULTIVITAMIN W/MINERALS CH
1.0000 | ORAL_TABLET | Freq: Every day | ORAL | Status: DC
  Administered 2019-01-06 – 2019-01-08 (×3): 1 via ORAL
  Filled 2019-01-06 (×4): qty 1

## 2019-01-06 MED ORDER — ATORVASTATIN CALCIUM 20 MG PO TABS
10.0000 mg | ORAL_TABLET | Freq: Every day | ORAL | Status: DC
  Administered 2019-01-06 – 2019-01-07 (×2): 10 mg via ORAL
  Filled 2019-01-06 (×2): qty 1

## 2019-01-06 MED ORDER — TRAZODONE HCL 50 MG PO TABS
100.0000 mg | ORAL_TABLET | Freq: Every day | ORAL | Status: DC
  Administered 2019-01-07: 100 mg via ORAL
  Filled 2019-01-06: qty 2

## 2019-01-06 MED ORDER — INSULIN GLARGINE 100 UNIT/ML ~~LOC~~ SOLN
16.0000 [IU] | Freq: Every day | SUBCUTANEOUS | Status: DC
  Administered 2019-01-07 – 2019-01-08 (×3): 16 [IU] via SUBCUTANEOUS
  Filled 2019-01-06 (×3): qty 0.16

## 2019-01-06 MED ORDER — ACETAMINOPHEN 325 MG PO TABS: 650.0000 mg | ORAL_TABLET | Freq: Four times a day (QID) | ORAL | Status: DC | PRN

## 2019-01-06 MED ORDER — SODIUM CHLORIDE 0.45 % IV SOLN: INTRAVENOUS | Status: DC

## 2019-01-06 MED ORDER — INSULIN REGULAR(HUMAN) IN NACL 100-0.9 UT/100ML-% IV SOLN
INTRAVENOUS | Status: DC
  Administered 2019-01-06: 4 [IU]/h via INTRAVENOUS
  Filled 2019-01-06: qty 100

## 2019-01-06 NOTE — H&P (Signed)
[facility] [place] [facility]   PATIENT NAME: Spencer [name]    MR#:  [id number]  DATE OF BIRTH:  [birth year]  DATE OF ADMISSION:  01/06/2019  PRIMARY CARE PHYSICIAN: [name], MD   REQUESTING/REFERRING PHYSICIAN: Dr Littie Deeds  CHIEF COMPLAINT:   Chief Complaint  Patient presents with  . Hyperglycemia    HISTORY OF PRESENT ILLNESS:  Vahan Shindler  is a 58 y.o. male with a known history of diabetes presents to the hospital not feeling well.  He states that he has had quite a bit of weight loss over the last year.  He has been feeling fatigued.  His vision is blurry.  His feet feel numb.  His ears are clogged up and he hears some ringing.  His back hurts.  In the ER he was found to have a sugar of 854.  His anion gap was elevated and hospitalist services were contacted for admission for DKA.  Patient states that he took his Lantus last night.  Did not take it today.  PAST MEDICAL HISTORY:   Past Medical History:  Diagnosis Date  . Brain aneurysm   . Diabetes mellitus without complication (HCC)   . Hypertension   . Substance abuse (HCC)     PAST SURGICAL HISTORY:   Past Surgical History:  Procedure Laterality Date  . HERNIA REPAIR    . NECK SURGERY      SOCIAL HISTORY:   Social History   Tobacco Use  . Smoking status: Current Every Day Smoker    Packs/day: 0.50    Years: 34.00    Pack years: 17.00    Types: Cigarettes  . Smokeless tobacco: Never Used  Substance Use Topics  . Alcohol use: Yes    Alcohol/week: 1.0 standard drinks    Types: 1 Cans of beer per week    FAMILY HISTORY:   Family History  Problem Relation Age of Onset  . CAD Sister   . Hypertension Sister   . Healthy Mother   . Healthy Father     DRUG ALLERGIES:  No Known Allergies  REVIEW OF SYSTEMS:  CONSTITUTIONAL: No fever, chills or sweats.  Positive for weight loss 80 to 90 pounds.  Positive for fatigue. EYES: Positive for blurred  vision. EARS, NOSE, AND THROAT: Positive for tinnitus and ear pain.  Positive for runny nose and sore throat. RESPIRATORY: No cough, shortness of breath, wheezing or hemoptysis.  CARDIOVASCULAR: No chest pain, orthopnea, edema.  GASTROINTESTINAL: No nausea, vomiting, diarrhea or abdominal pain. No blood in bowel movements.  Positive for constipation GENITOURINARY: No dysuria, hematuria.  ENDOCRINE: No polyuria, nocturia,  HEMATOLOGY: No anemia, easy bruising or bleeding SKIN: No rash or lesion. MUSCULOSKELETAL: Positive for back pain and shoulder pain NEUROLOGIC: Positive for numbness in his feet. PSYCHIATRY: No anxiety or depression.   MEDICATIONS AT HOME:   Prior to Admission medications   Medication Sig Start Date End Date Taking? Authorizing Provider  atorvastatin (LIPITOR) 10 MG tablet Take 10 mg by mouth daily.    [provider]  B-D INS SYR ULTRAFINE 1CC/31G 31G X 5/16" 1 ML MISC  01/03/17   [provider]  cyclobenzaprine (FLEXERIL) 10 MG tablet Take 1 tablet (10 mg total) by mouth 3 (three) times daily as needed. 02/13/18   Joni Reining, PA-C  FARXIGA 10 MG TABS tablet Take 10 mg by mouth every morning. 07/25/18   [provider]  gabapentin (NEURONTIN) 300 MG capsule Take 600 mg  by mouth 3 (three) times daily. 01/03/17   [provider]  insulin aspart (NOVOLOG) 100 UNIT/ML injection Inject 0-5 Units into the skin at bedtime. Patient taking differently: Inject 0-20 Units into the skin at bedtime.  01/18/17   Gouru, Deanna Artis, MD  insulin glargine (LANTUS) 100 UNIT/ML injection Inject 0.16 mLs (16 Units total) into the skin 2 (two) times daily. 10/27/18   Enedina Finner, MD  pantoprazole (PROTONIX) 40 MG tablet Take 40 mg by mouth daily.    [provider]  traZODone (DESYREL) 100 MG tablet take 1 tablet by mouth at bedtime 1 hour before BED 01/19/17   [provider]  TRESIBA FLEXTOUCH 200 UNIT/ML SOPN Inject 20 Units into the skin  daily. 07/26/18   [provider]      VITAL SIGNS:  Blood pressure (!) 153/103, pulse 93, temperature 98.6 F (37 C), temperature source Oral, resp. rate 18, height 6\' 1"  (1.854 m), weight 68.5 kg, SpO2 97 %.  PHYSICAL EXAMINATION:  GENERAL:  58 y.o.-year-old patient lying in the bed with no acute distress.  EYES: Pupils equal, round, reactive to light and accommodation. No scleral icterus. Extraocular muscles intact.  HEENT: Head atraumatic, normocephalic. Oropharynx and nasopharynx clear.  Unable to visualize tympanic membrane because it obscured by wax. NECK:  Supple, no jugular venous distention. No thyroid enlargement, no tenderness.  LUNGS: Normal breath sounds bilaterally, no wheezing, rales,rhonchi or crepitation. No use of accessory muscles of respiration.  CARDIOVASCULAR: S1, S2 normal. No murmurs, rubs, or gallops.  ABDOMEN: Soft, nontender, nondistended. Bowel sounds present. No organomegaly or mass.  EXTREMITIES: No pedal edema, cyanosis, or clubbing.  NEUROLOGIC: Cranial nerves II through XII are intact. Muscle strength 5/5 in all extremities. Sensation intact. Gait not checked.  PSYCHIATRIC: The patient is alert and oriented x 3.  SKIN: No rash, lesion, or ulcer.   LABORATORY PANEL:   CBC Recent Labs  Lab 01/06/19 1520  WBC 8.9  HGB 14.6  HCT 43.0  PLT 439*   ------------------------------------------------------------------------------------------------------------------  Chemistries  Recent Labs  Lab 01/06/19 1520  NA 125*  K 5.2*  CL 88*  CO2 20*  GLUCOSE 854*  BUN 23*  CREATININE 1.22  CALCIUM 9.7   ------------------------------------------------------------------------------------------------------------------    IMPRESSION AND PLAN:   1.  Diabetic ketoacidosis.  Start on insulin drip.  Check fingersticks every 1 hour.  IV fluid hydration.  N.p.o. until off insulin drip.  Not sure if he is taking [place] but this would now be  contraindicated.  Case discussed with critical care specialist. 2.  Accelerated hypertension.  Start Norvasc 5 mg daily.  Titrate meds as needed. 3.  Diabetic neuropathy on gabapentin 4.  Hyperlipidemia unspecified on atorvastatin 5.  Hyponatremia and hyperkalemia.  IV fluid hydration.  Recheck BMP in a few hours. 6.  Impacted cerumen start Debrox eardrops. 7.  Send off urine toxicology 8.  GERD on PPI   All the records are reviewed and case discussed with ED provider. Management plans discussed with the patient, and he is in agreement.  CODE STATUS: Full Code  TOTAL TIME TAKING CARE OF THIS PATIENT: 50 minutes.    Alford Highland M.D on 01/06/2019 at 5:33 PM  Between 7am to 6pm - Pager - [phone]  After 6pm call admission pager [phone]  [facility] Office  [phone]  CC: Primary care physician; [name], MD

## 2019-01-06 NOTE — ED Notes (Signed)
Date and time results received: 01/06/19 4:13 PM   Test: Glucose Critical Value: 854  Name of Provider Notified: williams   Orders Received? Or Actions Taken?: Hyperglycemia protocol with insulin drip will be ordered.  Will notify nurse first

## 2019-01-06 NOTE — Consult Note (Signed)
Name: Spencer [name] MRN: [id number] DOB: [birth year]    ADMISSION DATE:  01/06/2019 CONSULTATION DATE: 01/06/2019  REFERRING MD : Dr. Renae Gloss  CHIEF COMPLAINT: Hyperglycemia   BRIEF PATIENT DESCRIPTION:  58 yo male admitted with DKA secondary to medication noncompliance requiring insulin gtt   SIGNIFICANT EVENTS/STUDIES:  03/2-Pt admitted to stepdown unit   HISTORY OF PRESENT ILLNESS:   This is a 58 yo male with a PMH as listed below who presented to [facility] ER on 03/2 with c/o fatigue, blurred vision, weight loss, and bilateral feet numbness.  Due to symptoms pt went to his PCP, CBG read "high" and he was instructed to proceed to the ER.  Upon arrival to the ER lab results ruled pt in for DKA, therefore insulin gtt initiated. The pt states he has had trouble obtaining his medications, however he states he thinks his last dose of insulin was either 01/04/2019 or 01/05/2019.  The pt states he does drink alcohol daily, last alcoholic beverage 03/1.  Urine drug screen performed and pt positive for cocaine.  He was subsequently admitted to the stepdown unit for additional workup and treatment.  PAST MEDICAL HISTORY :   has a past medical history of Brain aneurysm, Diabetes mellitus without complication (HCC), Hypertension, and Substance abuse (HCC).  has a past surgical history that includes Neck surgery and Hernia repair. Prior to Admission medications   Medication Sig Start Date End Date Taking? Authorizing Provider  atorvastatin (LIPITOR) 10 MG tablet Take 10 mg by mouth daily.   Yes [provider]  FARXIGA 10 MG TABS tablet Take 10 mg by mouth every morning. 07/25/18  Yes [provider]  insulin aspart (NOVOLOG) 100 UNIT/ML injection Inject 0-5 Units into the skin at bedtime. Patient taking differently: Inject 0-20 Units into the skin at bedtime.  01/18/17  Yes Gouru, Aruna, MD  insulin glargine (LANTUS) 100 UNIT/ML injection Inject 0.16 mLs (16 Units total) into the skin 2  (two) times daily. 10/27/18  Yes Enedina Finner, MD  pantoprazole (PROTONIX) 40 MG tablet Take 40 mg by mouth daily.   Yes [provider]  traZODone (DESYREL) 100 MG tablet take 1 tablet by mouth at bedtime 1 hour before BED 01/19/17  Yes [provider]  TRESIBA FLEXTOUCH 200 UNIT/ML SOPN Inject 20 Units into the skin daily. 07/26/18  Yes [provider]  B-D INS SYR ULTRAFINE 1CC/31G 31G X 5/16" 1 ML MISC  01/03/17   [provider]  cyclobenzaprine (FLEXERIL) 10 MG tablet Take 1 tablet (10 mg total) by mouth 3 (three) times daily as needed. 02/13/18   Joni Reining, PA-C  gabapentin (NEURONTIN) 300 MG capsule Take 600 mg by mouth 3 (three) times daily. 01/03/17   [provider]   No Known Allergies  FAMILY HISTORY:  family history includes CAD in his sister; Healthy in his father and mother; Hypertension in his sister. SOCIAL HISTORY:  reports that he has been smoking cigarettes. He has a 17.00 pack-year smoking history. He has never used smokeless tobacco. He reports current alcohol use of about 1.0 standard drinks of alcohol per week. He reports current drug use. Drug: Cocaine.  REVIEW OF SYSTEMS: Positives in BOLD  Constitutional: fever, chills, weight loss, malaise/fatigue, weight loss, and diaphoresis.  HENT: Negative for hearing loss, ear pain, nosebleeds, congestion, sore throat, neck pain, tinnitus and ear discharge.   Eyes: blurred vision, double vision, photophobia, pain, discharge and redness.  Respiratory: Negative for cough, hemoptysis, sputum production, shortness  of breath, wheezing and stridor.   Cardiovascular: Negative for chest pain, palpitations, orthopnea, claudication, leg swelling and PND.  Gastrointestinal: Negative for heartburn, nausea, vomiting, abdominal pain, diarrhea, constipation, blood in stool and melena.  Genitourinary: Negative for dysuria, urgency, frequency, hematuria and flank pain.  Musculoskeletal: Negative for  myalgias, back pain, joint pain and falls.  Skin: Negative for itching and rash.  Neurological: Negative for dizziness, tingling, tremors, sensory change, speech change, focal weakness, seizures, loss of consciousness, weakness and headaches.  Endo/Heme/Allergies: Negative for environmental allergies and polydipsia. Does not bruise/bleed easily.  SUBJECTIVE:  No complaints at this time  VITAL SIGNS: Temp:  [98.6 F (37 C)] 98.6 F (37 C) (03/02 1502) Pulse Rate:  [71-93] 75 (03/02 2000) Resp:  [16-18] 16 (03/02 1924) BP: (129-159)/(84-103) 129/84 (03/02 2000) SpO2:  [94 %-98 %] 94 % (03/02 2000) Weight:  [68.5 kg] 68.5 kg (03/02 1503)  PHYSICAL EXAMINATION: General: well developed, well nourished male, NAD  Neuro: alert and oriented, follows commands  HEENT: supple, no JVD Cardiovascular: nsr, rrr, no R/G Lungs: clear throughout, even, non labored  Abdomen: +BS x4, soft, non tender, non distended  Musculoskeletal: normal bulk and tone, no edema  Skin: intact no rashes or lesions   Recent Labs  Lab 01/06/19 1520  NA 125*  K 5.2*  CL 88*  CO2 20*  BUN 23*  CREATININE 1.22  GLUCOSE 854*   Recent Labs  Lab 01/06/19 1520  HGB 14.6  HCT 43.0  WBC 8.9  PLT 439*   No results found.  ASSESSMENT / PLAN:  Diabetic Ketoacidosis  Continue insulin gtt until anion gap closed and CO2 >20 BMP q4hrs and CBG's q1hr while on insulin gtt  Hemoglobin A1c pending   Hypertension  Continuous telemetry monitoring  Continue amlodipine   Hyponatremia and pseudohyperkalemia in setting of DKA  Trend BMP  Replace electrolytes as indicated  Monitor UOP IV fluids per DKA protocol   Diabetic neuropathy  Continue gabapentin   Polysubstance and ETOH abuse  Polysubstance abuse cessation counseling provided  CIWA protocol  VTE px: subq lovenox   Sonda Rumble, AGNP  Pulmonary/Critical Care Pager [phone] (please enter 7 digits) PCCM Consult Pager [phone] (please enter  7 digits)

## 2019-01-06 NOTE — ED Triage Notes (Signed)
Pt was sent here by his PCP where he was found to have "high" blood sugar.  Pt states that he has diabetes and has been without meds as he could not find them due to move.  Pt took insulin and metformin last yesterday.  Pt is alert and oriented, he is reporting feeling thirsty.

## 2019-01-06 NOTE — ED Provider Notes (Signed)
[facility] Emergency Department Provider Note       Time seen: ----------------------------------------- 4:49 PM on 01/06/2019 -----------------------------------------   I have reviewed the triage vital signs and the nursing notes.  HISTORY   Chief Complaint Hyperglycemia    HPI Spencer [name] is a 58 y.o. male with a history of brain aneurysm, diabetes, hypertension, substance abuse, chronic pain who presents to the ED for high blood sugar.  Patient states he has diabetes and has been without his medications as he could not find them due to recently moving.  He took insulin and metformin last yesterday.  He arrives alert and oriented but feels very thirsty and weak.  Past Medical History:  Diagnosis Date  . Brain aneurysm   . Diabetes mellitus without complication (HCC)   . Hypertension   . Substance abuse [facility])     Patient Active Problem List   Diagnosis Date Noted  . Hypoglycemia 10/23/2018  . Chronic low back pain 01/16/2017  . Chronic neck pain 01/16/2017  . Diabetic neuropathy (HCC) 01/16/2017  . DM2 (diabetes mellitus, type 2) (HCC) 01/16/2017  . History of cocaine abuse (HCC) 01/16/2017  . Personal history of subdural hematoma 01/16/2017  . Closed displaced fracture of body of left calcaneus with delayed healing 12/13/2016  . Protein-calorie malnutrition, severe 11/08/2016  . DKA, type 2 (HCC) 11/06/2016  . Diabetic ketoacidosis (HCC) 04/03/2015  . DKA (diabetic ketoacidoses) (HCC) 04/03/2015  . Hypertension 04/03/2015  . Depression 04/03/2015  . Opiate dependence (HCC) 04/03/2015  . Tobacco abuse 04/03/2015  . Lumbosacral neuritis 07/19/2014  . Pain of finger of right hand 07/19/2014  . Type II or unspecified type diabetes mellitus without mention of complication, not stated as uncontrolled 07/19/2014  . Knee pain 09/12/2013  . Hernia of flank 09/20/2012  . Epidermoid cyst of skin 09/20/2012  . Chronic pain of both shoulders  03/27/2012    Past Surgical History:  Procedure Laterality Date  . HERNIA REPAIR    . NECK SURGERY      Allergies Patient has no known allergies.  Social History Social History   Tobacco Use  . Smoking status: Current Every Day Smoker    Packs/day: 0.50    Years: 34.00    Pack years: 17.00    Types: Cigarettes  . Smokeless tobacco: Never Used  Substance Use Topics  . Alcohol use: Yes    Alcohol/week: 1.0 standard drinks    Types: 1 Cans of beer per week  . Drug use: Yes    Types: Cocaine   Review of Systems Constitutional: Negative for fever. Cardiovascular: Negative for chest pain. Respiratory: Negative for shortness of breath. Gastrointestinal: Negative for abdominal pain, vomiting and diarrhea. Genitourinary: Positive for polyuria Musculoskeletal: Negative for back pain. Skin: Negative for rash. Neurological: Positive for weakness  All systems negative/normal/unremarkable except as stated in the HPI  ____________________________________________   PHYSICAL EXAM:  VITAL SIGNS: ED Triage Vitals  Enc Vitals Group     BP 01/06/19 1502 (!) 159/101     Pulse Rate 01/06/19 1502 93     Resp 01/06/19 1502 18     Temp 01/06/19 1502 98.6 F (37 C)     Temp Source 01/06/19 1502 Oral     SpO2 01/06/19 1502 97 %     Weight 01/06/19 1503 151 lb (68.5 kg)     Height 01/06/19 1503 6\' 1"  (1.854 m)     Head Circumference --      Peak Flow --  Pain Score 01/06/19 1503 8     Pain Loc --      Pain Edu? --      Excl. in GC? --    Constitutional: Alert and oriented. Well appearing and in no distress. Eyes: Conjunctivae are normal. Normal extraocular movements. ENT      Head: Normocephalic and atraumatic.      Nose: No congestion/rhinnorhea.      Mouth/Throat: Mucous membranes are dry      Neck: No stridor. Cardiovascular: Normal rate, regular rhythm. No murmurs, rubs, or gallops. Respiratory: Normal respiratory effort without tachypnea nor retractions. Breath  sounds are clear and equal bilaterally. No wheezes/rales/rhonchi. Gastrointestinal: Soft and nontender. Normal bowel sounds Musculoskeletal: Nontender with normal range of motion in extremities. No lower extremity tenderness nor edema. Neurologic:  Normal speech and language. No gross focal neurologic deficits are appreciated.  Skin:  Skin is warm, dry and intact. No rash noted. Psychiatric: Mood and affect are normal. Speech and behavior are normal.  ____________________________________________  ED COURSE:  As part of my medical decision making, I reviewed the following data within the electronic MEDICAL RECORD NUMBER History obtained from family if available, nursing notes, old chart and ekg, as well as notes from prior ED visits. Patient presented for hyperglycemia, we will assess with labs and imaging as indicated at this time.   Procedures ____________________________________________   CRITICAL CARE Performed by: Ulice Dash   Total critical care time: 30 minutes  Critical care time was exclusive of separately billable procedures and treating other patients.  Critical care was necessary to treat or prevent imminent or life-threatening deterioration.  Critical care was time spent personally by me on the following activities: development of treatment plan with patient and/or surrogate as well as nursing, discussions with consultants, evaluation of patient's response to treatment, examination of patient, obtaining history from patient or surrogate, ordering and performing treatments and interventions, ordering and review of laboratory studies, ordering and review of radiographic studies, pulse oximetry and re-evaluation of patient's condition.   LABS (pertinent positives/negatives)  Labs Reviewed  BASIC METABOLIC PANEL - Abnormal; Notable for the following components:      Result Value   Sodium 125 (*)    Potassium 5.2 (*)    Chloride 88 (*)    CO2 20 (*)    Glucose, Bld  854 (*)    BUN 23 (*)    Anion gap 17 (*)    All other components within normal limits  CBC - Abnormal; Notable for the following components:   Platelets 439 (*)    All other components within normal limits  URINALYSIS, COMPLETE (UACMP) WITH MICROSCOPIC - Abnormal; Notable for the following components:   Color, Urine COLORLESS (*)    APPearance CLEAR (*)    Glucose, UA >=500 (*)    Ketones, ur 20 (*)    All other components within normal limits  GLUCOSE, CAPILLARY - Abnormal; Notable for the following components:   Glucose-Capillary >600 (*)    All other components within normal limits  GLUCOSE, CAPILLARY - Abnormal; Notable for the following components:   Glucose-Capillary >600 (*)    All other components within normal limits  CBG MONITORING, ED  ____________________________________________   DIFFERENTIAL DIAGNOSIS   Dehydration, electrolyte abnormality, DKA, HH NK, occult infection  FINAL ASSESSMENT AND PLAN  Hyperglycemia, likely DKA   Plan: The patient had presented for hyperglycemia and weakness. Patient's labs do likely indicate mild DKA with a blood sugar of 854.  Anion  gap is slightly elevated at 17.  This appears to be noncompliance related.  We have started him on fluids and an insulin drip.  I will discuss with the hospitalist for admission. Ulice Dash, MD    Note: This note was generated in part or whole with voice recognition software. Voice recognition is usually quite accurate but there are transcription errors that can and very often do occur. I apologize for any typographical errors that were not detected and corrected.     [name], MD 01/06/19 [phone]

## 2019-01-06 NOTE — ED Notes (Signed)
Pt states that when his CBG read high at his PCP he was given insulin.  Pt is unsure of how much.  CBG still "high" here.  Labs sent.  Pt is alert and oriented, no distress at this time

## 2019-01-06 NOTE — ED Notes (Signed)
ED TO INPATIENT HANDOFF REPORT  ED Nurse Name and Phone #:  Gerarda Gunther RN  [phone]  S Name/Age/Gender Spencer [name] 58 y.o. male Room/Bed: ED03A/ED03A  Code Status   Code Status: Full Code  Home/SNF/Other Home Patient oriented to: self, place, time and situation Is this baseline? Yes   Triage Complete: Triage complete  Chief Complaint sent by dr/high blood sugar  Triage Note Pt was sent here by his PCP where he was found to have "high" blood sugar.  Pt states that he has diabetes and has been without meds as he could not find them due to move.  Pt took insulin and metformin last yesterday.  Pt is alert and oriented, he is reporting feeling thirsty.     Allergies No Known Allergies  Level of Care/Admitting Diagnosis ED Disposition    ED Disposition Condition Comment   Admit  Hospital Area: [facility] [[id number]]  Level of Care: Stepdown [14]  Diagnosis: DKA (diabetic ketoacidoses) [facility]) [[id number]]  Admitting Physician: Alford Highland [[id number]]  Attending Physician: Alford Highland [phone]  Estimated length of stay: past midnight tomorrow  Certification:: I certify this patient will need inpatient services for at least 2 midnights  PT Class (Do Not Modify): Inpatient [101]  PT Acc Code (Do Not Modify): Private [1]       B Medical/Surgery History Past Medical History:  Diagnosis Date  . Brain aneurysm   . Diabetes mellitus without complication (HCC)   . Hypertension   . Substance abuse [facility])    Past Surgical History:  Procedure Laterality Date  . HERNIA REPAIR    . NECK SURGERY       A IV Location/Drains/Wounds Patient Lines/Drains/Airways Status   Active Line/Drains/Airways    Name:   Placement date:   Placement time:   Site:   Days:   Peripheral IV 01/06/19 Right Forearm   01/06/19    1522    Forearm   less than 1          Intake/Output Last 24 hours  Intake/Output Summary (Last 24 hours) at 01/06/2019 1747 Last data filed at  01/06/2019 1737 Gross per 24 hour  Intake 2115.55 ml  Output -  Net 2115.55 ml    Labs/Imaging Results for orders placed or performed during the hospital encounter of 01/06/19 (from the past 48 hour(s))  Glucose, capillary     Status: Abnormal   Collection Time: 01/06/19  3:17 PM  Result Value Ref Range   Glucose-Capillary >600 (HH) 70 - 99 mg/dL  Basic metabolic panel     Status: Abnormal   Collection Time: 01/06/19  3:20 PM  Result Value Ref Range   Sodium 125 (L) 135 - 145 mmol/L   Potassium 5.2 (H) 3.5 - 5.1 mmol/L   Chloride 88 (L) 98 - 111 mmol/L   CO2 20 (L) 22 - 32 mmol/L   Glucose, Bld 854 (HH) 70 - 99 mg/dL    Comment: CRITICAL RESULT CALLED TO, READ BACK BY AND VERIFIED WITH KIM GAULT AT 1606 ON 01/06/2019 [facility].    BUN 23 (H) 6 - 20 mg/dL   Creatinine, Ser 0.03 0.61 - 1.24 mg/dL   Calcium 9.7 8.9 - 49.1 mg/dL   GFR calc non Af Amer >60 >60 mL/min   GFR calc Af Amer >60 >60 mL/min   Anion gap 17 (H) 5 - 15    Comment: Performed at [facility], [address] [place], [place] [address]  CBC  Status: Abnormal   Collection Time: 01/06/19  3:20 PM  Result Value Ref Range   WBC 8.9 4.0 - 10.5 K/uL   RBC 4.65 4.22 - 5.81 MIL/uL   Hemoglobin 14.6 13.0 - 17.0 g/dL   HCT 81.2 75.1 - 70.0 %   MCV 92.5 80.0 - 100.0 fL   MCH 31.4 26.0 - 34.0 pg   MCHC 34.0 30.0 - 36.0 g/dL   RDW 17.4 94.4 - 96.7 %   Platelets 439 (H) 150 - 400 K/uL   nRBC 0.0 0.0 - 0.2 %    Comment: Performed at [facility], [address] [place], [place] [address]  Urinalysis, Complete w Microscopic     Status: Abnormal   Collection Time: 01/06/19  3:20 PM  Result Value Ref Range   Color, Urine COLORLESS (A) YELLOW   APPearance CLEAR (A) CLEAR   Specific Gravity, Urine 1.025 1.005 - 1.030   pH 5.0 5.0 - 8.0   Glucose, UA >=500 (A) NEGATIVE mg/dL   Hgb urine dipstick NEGATIVE NEGATIVE   Bilirubin Urine NEGATIVE NEGATIVE   Ketones, ur 20 (A) NEGATIVE mg/dL   Protein, ur  NEGATIVE NEGATIVE mg/dL   Nitrite NEGATIVE NEGATIVE   Leukocytes,Ua NEGATIVE NEGATIVE   WBC, UA NONE SEEN 0 - 5 WBC/hpf   Bacteria, UA NONE SEEN NONE SEEN   Squamous Epithelial / LPF NONE SEEN 0 - 5    Comment: Performed at [facility], [address] [place], [place] [address]  Glucose, capillary     Status: Abnormal   Collection Time: 01/06/19  4:09 PM  Result Value Ref Range   Glucose-Capillary >600 (HH) 70 - 99 mg/dL  Glucose, capillary     Status: Abnormal   Collection Time: 01/06/19  5:33 PM  Result Value Ref Range   Glucose-Capillary 458 (H) 70 - 99 mg/dL   No results found.  Pending Labs Unresulted Labs (From admission, onward)    Start     Ordered   01/13/19 0500  Creatinine, serum  (enoxaparin (LOVENOX)    CrCl >/= 30 ml/min)  Weekly,   STAT    Comments:  while on enoxaparin therapy    01/06/19 1729   01/07/19 0500  Basic metabolic panel  Tomorrow morning,   STAT     01/06/19 1729   01/07/19 0500  CBC  Tomorrow morning,   STAT     01/06/19 1729   01/06/19 2100  Basic metabolic panel  Once,   STAT     01/06/19 1742   01/06/19 1729  Hemoglobin A1c  Add-on,   AD     01/06/19 1729   01/06/19 1708  Urine Drug Screen, Qualitative ([facility] only)  Add-on,   AD     01/06/19 1707   01/06/19 1701  Beta-hydroxybutyric acid  Add-on,   AD     01/06/19 1700   01/06/19 1701  Blood gas, venous  ONCE - STAT,   STAT     01/06/19 1700          Vitals/Pain Today's Vitals   01/06/19 1503 01/06/19 1512 01/06/19 1513 01/06/19 1736  BP:  (!) 153/103  (!) 151/102  Pulse:    71  Resp:    16  Temp:      TempSrc:      SpO2:    98%  Weight: 68.5 kg     Height: 6\' 1"  (1.854 m)     PainSc: 8   8  8  Isolation Precautions No active isolations  Medications Medications  dextrose 5 %-0.45 % sodium chloride infusion ( Intravenous Hold 01/06/19 1650)  insulin regular bolus via infusion 0-10 Units (0 Units Intravenous Not Given 01/06/19 1745)  insulin regular, human  (MYXREDLIN) 100 units/ 100 mL infusion (4 Units/hr Intravenous New Bag/Given 01/06/19 1744)  carbamide peroxide (DEBROX) 6.5 % OTIC (EAR) solution 5 drop (has no administration in time range)  0.9 %  sodium chloride infusion (has no administration in time range)  enoxaparin (LOVENOX) injection 40 mg (has no administration in time range)  acetaminophen (TYLENOL) tablet 650 mg (has no administration in time range)    Or  acetaminophen (TYLENOL) suppository 650 mg (has no administration in time range)  ondansetron (ZOFRAN) tablet 4 mg (has no administration in time range)    Or  ondansetron (ZOFRAN) injection 4 mg (has no administration in time range)  atorvastatin (LIPITOR) tablet 10 mg (has no administration in time range)  traZODone (DESYREL) tablet 100 mg (has no administration in time range)  pantoprazole (PROTONIX) EC tablet 40 mg (has no administration in time range)  cyclobenzaprine (FLEXERIL) tablet 10 mg (has no administration in time range)  gabapentin (NEURONTIN) capsule 600 mg (has no administration in time range)  amLODipine (NORVASC) tablet 5 mg (has no administration in time range)  sodium chloride 0.9 % bolus 1,000 mL (0 mLs Intravenous Stopped 01/06/19 1609)  sodium chloride 0.9 % bolus 1,000 mL (0 mLs Intravenous Stopped 01/06/19 1737)    Mobility walks Low fall risk   Focused Assessments Neuro Assessment Handoff:  Swallow screen pass? Not indicated         Neuro Assessment: Within Defined Limits Neuro Checks:      Last Documented NIHSS Modified Score:   Has TPA been given? No If patient is a Neuro Trauma and patient is going to OR before floor call report to 4N Charge nurse: [phone] or [phone]     R Recommendations: See Admitting Provider Note  Report given to:   Additional Notes:  Pt here for elevated BG over 800 PTA, Given 2 IV boluses of NS, BG prior to start of insulin drip 458. Drip initiated at 4 units/hr, IV to right FA. VSS. PT Alert and  oriented. HX of DM and frequent admissions for DKA. PT reporting polydipsia and polyphasia.

## 2019-01-06 NOTE — ED Notes (Signed)
Pt ambulates to the bathroom to void.  

## 2019-01-07 LAB — CBC
HCT: 42.5 % (ref 39.0–52.0)
Hemoglobin: 14.6 g/dL (ref 13.0–17.0)
MCH: 31.1 pg (ref 26.0–34.0)
MCHC: 34.4 g/dL (ref 30.0–36.0)
MCV: 90.4 fL (ref 80.0–100.0)
Platelets: 377 10*3/uL (ref 150–400)
RBC: 4.7 MIL/uL (ref 4.22–5.81)
RDW: 11.9 % (ref 11.5–15.5)
WBC: 9 10*3/uL (ref 4.0–10.5)
nRBC: 0 % (ref 0.0–0.2)

## 2019-01-07 LAB — GLUCOSE, CAPILLARY
GLUCOSE-CAPILLARY: 375 mg/dL — AB (ref 70–99)
Glucose-Capillary: 102 mg/dL — ABNORMAL HIGH (ref 70–99)
Glucose-Capillary: 111 mg/dL — ABNORMAL HIGH (ref 70–99)
Glucose-Capillary: 129 mg/dL — ABNORMAL HIGH (ref 70–99)
Glucose-Capillary: 144 mg/dL — ABNORMAL HIGH (ref 70–99)
Glucose-Capillary: 151 mg/dL — ABNORMAL HIGH (ref 70–99)
Glucose-Capillary: 152 mg/dL — ABNORMAL HIGH (ref 70–99)
Glucose-Capillary: 264 mg/dL — ABNORMAL HIGH (ref 70–99)
Glucose-Capillary: 375 mg/dL — ABNORMAL HIGH (ref 70–99)
Glucose-Capillary: 93 mg/dL (ref 70–99)

## 2019-01-07 LAB — MRSA PCR SCREENING: MRSA by PCR: NEGATIVE

## 2019-01-07 LAB — HEMOGLOBIN A1C
HEMOGLOBIN A1C: 11.3 % — AB (ref 4.8–5.6)
Mean Plasma Glucose: 277.61 mg/dL

## 2019-01-07 MED ORDER — INSULIN ASPART 100 UNIT/ML ~~LOC~~ SOLN: 0.0000 [IU] | Freq: Three times a day (TID) | SUBCUTANEOUS | Status: DC

## 2019-01-07 MED ORDER — INSULIN ASPART 100 UNIT/ML ~~LOC~~ SOLN: 0.0000 [IU] | Freq: Every day | SUBCUTANEOUS | Status: DC

## 2019-01-07 MED ORDER — INSULIN ASPART 100 UNIT/ML ~~LOC~~ SOLN
4.0000 [IU] | Freq: Three times a day (TID) | SUBCUTANEOUS | Status: DC
  Administered 2019-01-08: 4 [IU] via SUBCUTANEOUS
  Filled 2019-01-07: qty 1

## 2019-01-07 MED ORDER — INSULIN ASPART 100 UNIT/ML ~~LOC~~ SOLN
0.0000 [IU] | Freq: Three times a day (TID) | SUBCUTANEOUS | Status: DC
  Administered 2019-01-07: 15 [IU] via SUBCUTANEOUS
  Administered 2019-01-08: 08:00:00 2 [IU] via SUBCUTANEOUS
  Filled 2019-01-07 (×2): qty 1

## 2019-01-07 MED ORDER — OXYCODONE HCL 5 MG PO TABS
5.0000 mg | ORAL_TABLET | Freq: Four times a day (QID) | ORAL | Status: DC | PRN
  Administered 2019-01-07: 5 mg via ORAL
  Filled 2019-01-07: qty 1

## 2019-01-07 NOTE — Progress Notes (Signed)
Care of patient taken over from Tiffany, RN 

## 2019-01-07 NOTE — Progress Notes (Addendum)
Inpatient Diabetes Program Recommendations  AACE/ADA: New Consensus Statement on Inpatient Glycemic Control   Target Ranges:  Prepandial:   less than 140 mg/dL      Peak postprandial:   less than 180 mg/dL (1-2 hours)      Critically ill patients:  140 - 180 mg/dL   Results for Spencer [name], Spencer [name] (MRN [id number]) as of 01/07/2019 10:04  Ref. Range 01/07/2019 00:31 01/07/2019 01:34 01/07/2019 02:35 01/07/2019 03:28 01/07/2019 04:27 01/07/2019 07:43  Glucose-Capillary Latest Ref Range: 70 - 99 mg/dL 111 (H) 129 (H) 152 (H)  Lantus 16 units @ 2:38 151 (H) 144 (H) 102 (H)  Lantus 16 units @ 9:16  Results for Spencer [name], Spencer [name] (MRN [id number]) as of 01/07/2019 10:04  Ref. Range 01/06/2019 15:20  Glucose Latest Ref Range: 70 - 99 mg/dL [facility])   Results for Spencer [name], Spencer [name] (MRN [id number]) as of 01/07/2019 10:04  Ref. Range 01/06/2019 17:30  Hemoglobin A1C Latest Ref Range: 4.8 - 5.6 % 11.3 (H)   Review of Glycemic Control  Diabetes history: DM2 Outpatient Diabetes medications: Novolog 0-20 units 2-3 times per day for correction based on glucose, Tresiba 20 units daily, states he is taking Metformin and he is NOT TAKING [place] Current orders for Inpatient glycemic control: Lantus 16 units daily, Novolog 0-15 units TID with meals  Inpatient Diabetes Program Recommendations:  Correction (SSI): Please consider ordering Novolog 0-5 units QHS for bedtime correction. HgbA1C: A1C 11.3% on 01/06/19 indicating an average glucose of 278 mg/dl over the past 2-3 months.  NOTE: Noted patient admitted with DKA and was ordered IV insulin drip. Patient has been transitioned to SQ insulin (received Lantus 16 units at 2:38 am on 01/07/19). Noted patient received Lantus 16 units again this morning already at 9:16 am.   Addendum 01/07/19_0 :30-Spoke with patient about diabetes and home regimen for diabetes control. Patient reports that he is followed by PCP for diabetes management. However, he just seen PCP yesterday and prior to that it had been  about 8 months since he was recently staying with his brother in [place].  Patient reports that he is taking  [place] 20 units daily, Novolog 0-20 units 2-3 times per day for correction based on glucose, and Metformin. Patient is not sure about dose of Metformin he is taking. Inquired about Wilder Glade and patient states that he is NOT TAKING [place]. Inquired about Lantus noted on home med list and patient states that Lantus was changed out for Tyler Aas so he is no longer taking Lantus.  Patient reports that he is taking DM medications as prescribed and states that he does not have much insulin left and will need a refill for [place] and Novolog.  Patient states that he checks his glucose 2-3 times per day and that it is up and down (30 mg/dl to HI on glucometer over past 1-2 weeks).  Patient notes that he is completely out of test strips at this time and will need the get a prescription for them and a new glucometer at time of discharge.  Inquired about prior A1C and patient reports that he does not recall his last A1C value. Discussed A1C results (11.3% on 01/06/19) and explained that his current A1C indicates an average glucose of 278 mg/dl over the past 2-3 months. Patient states that he thinks current A1C is an improvement. Patient notes that he needs surgery on his foot but the surgeon wants patient to get A1C down to 7% before they will do the surgery.  Discussed  glucose and A1C goals. Discussed importance of checking CBGs and maintaining good CBG control to prevent long-term and short-term complications. Explained how hyperglycemia leads to damage within blood vessels which lead to the common complications seen with uncontrolled diabetes. Stressed to the patient the importance of improving glycemic control to prevent further complications from uncontrolled diabetes. Also discussed how hyperglycemia can lead to poor outcomes following surgery. Discussed impact of nutrition, exercise, stress, sickness, and  medications on diabetes control.  Patient states that he use to see an Endocrinologist in the past but they moved and he needs a new Endocrinologist but he needs a referral to see one. Will ask MD to make referral to Endocrinologist at [facility].  Encouraged patient to check his glucose 4 times per day (before meals and at bedtime) and to keep a log book of glucose readings and insulin taken which he will need to take to doctor appointments. Explained how the doctor he follows up with can use the log book to continue to make insulin adjustments if needed. Patient verbalized understanding of information discussed and he states that he has no further questions at this time related to diabetes.  At time of discharge, please provide Rx for: glucose monitoring kit (#[id number]), Tresiba pens, Novolog pens, pen needles [phone]). Please consider making referral to Endocrinologist at [facility].  Thanks, Barnie Alderman, RN, MSN, CDE Diabetes Coordinator Inpatient Diabetes Program [phone] (Team Pager from 8am to 5pm)

## 2019-01-07 NOTE — Progress Notes (Signed)
Received order for additional pain medication- when in to offer to patient- patient was asleep.

## 2019-01-07 NOTE — Progress Notes (Addendum)
[employer] [facility] - [place] at [facility]   PATIENT NAME: Spencer [name]    MR#:  [id number]  DATE OF BIRTH:  [birth year]  SUBJECTIVE:   Patient states he feels fine this morning.  He does not know how his blood sugars got so high.  He states he is taking his Lantus and NovoLog as prescribed.  He does note that he has no idea where his insulin currently is.  He thinks they may be somewhere in his car.  He denies any polyuria or polydipsia.  REVIEW OF SYSTEMS:  Review of Systems  Constitutional: Negative for chills and fever.  HENT: Negative for congestion and sore throat.   Eyes: Negative for blurred vision and double vision.  Respiratory: Negative for cough and shortness of breath.   Cardiovascular: Negative for chest pain and palpitations.  Gastrointestinal: Negative for nausea and vomiting.  Genitourinary: Negative for dysuria and urgency.  Musculoskeletal: Positive for back pain. Negative for neck pain.  Neurological: Negative for dizziness and headaches.  Psychiatric/Behavioral: Negative for depression. The patient is not nervous/anxious.     DRUG ALLERGIES:  No Known Allergies VITALS:  Blood pressure 130/85, pulse 72, temperature 98.3 F (36.8 C), temperature source Oral, resp. rate 18, height 6' (1.829 m), weight 66.1 kg, SpO2 99 %. PHYSICAL EXAMINATION:  Physical Exam  GENERAL:  58 y.o.-year-old patient lying in the bed with no acute distress.  Thin appearing. EYES: Pupils equal, round, reactive to light and accommodation. No scleral icterus. Extraocular muscles intact.  HEENT: Head atraumatic, normocephalic. Oropharynx and nasopharynx clear.  Unable to visualize tympanic membrane because it obscured by wax. NECK:  Supple, no jugular venous distention. No thyroid enlargement, no tenderness.  LUNGS: Normal breath sounds bilaterally, no wheezing, rales,rhonchi or crepitation. No use of accessory muscles of respiration.  CARDIOVASCULAR: RRR, S1, S2 normal. No  murmurs, rubs, or gallops.  ABDOMEN: Soft, nontender, nondistended. Bowel sounds present. No organomegaly or mass.  EXTREMITIES: No pedal edema, cyanosis, or clubbing.  NEUROLOGIC: Cranial nerves II through XII are intact. Muscle strength 5/5 in all extremities. Sensation intact. Gait not checked.  PSYCHIATRIC: The patient is alert and oriented x 3.  SKIN: No rash, lesion, or ulcer.  LABORATORY PANEL:  Male CBC Recent Labs  Lab 01/07/19 0409  WBC 9.0  HGB 14.6  HCT 42.5  PLT 377   ------------------------------------------------------------------------------------------------------------------ Chemistries  Recent Labs  Lab 01/06/19 2120  NA 139  K 3.6  CL 105  CO2 23  GLUCOSE 155*  BUN 17  CREATININE 0.84  CALCIUM 9.0   RADIOLOGY:  No results found. ASSESSMENT AND PLAN:   1.    Uncontrolled type 2 diabetes- DKA resolved.  Blood sugars improving. A1c 11.3%. Off insulin drip.  Continue Lantus 16 units daily and moderate SSI.   2.  Hypertension-BPs improved.    Continue Norvasc 5 mg daily.   3.  Diabetic neuropathy-continue gabapentin  4.  Hyperlipidemia-continue atorvastatin  5. Impacted cerumen-continue debrox eardrops.  6.  GERD on PPI  7.  Alcohol/cocaine abuse- UDS positive for cocaine.  Will discuss cocaine cessation with patient. CIWA protocol.  All the records are reviewed and case discussed with Care Management/Social Worker. Management plans discussed with the patient, family and they are in agreement.  CODE STATUS: Full Code  TOTAL TIME TAKING CARE OF THIS PATIENT: 35 minutes.   More than 50% of the time was spent in counseling/coordination of care: YES  POSSIBLE D/C tomorrow, DEPENDING ON CLINICAL CONDITION.   Spencer [name]  D Loletta Harper M.D on 01/07/2019 at 1:45 PM  Between 7am to 6pm - Pager - [phone]  After 6pm go to [email or web address] - Social research officer, government  [facility] [place] [facility]  Office  [phone]  CC: Primary care physician;  [name], MD  Note: This dictation was prepared with Dragon dictation along with smaller phrase technology. Any transcriptional errors that result from this process are unintentional.

## 2019-01-07 NOTE — Progress Notes (Signed)
Contacted hospitalist r/t elevated BG of 375. See new orders.

## 2019-01-07 NOTE — Progress Notes (Signed)
Patient is being transferred to room 107. Report was given to Tiffany, RN and she verbalized understanding of all information.

## 2019-01-08 LAB — BASIC METABOLIC PANEL
ANION GAP: 8 (ref 5–15)
BUN: 28 mg/dL — AB (ref 6–20)
CO2: 25 mmol/L (ref 22–32)
Calcium: 8.8 mg/dL — ABNORMAL LOW (ref 8.9–10.3)
Chloride: 105 mmol/L (ref 98–111)
Creatinine, Ser: 0.86 mg/dL (ref 0.61–1.24)
GFR calc Af Amer: >60 mL/min (ref >60)
GFR calc non Af Amer: >60 mL/min (ref >60)
Glucose, Bld: 88 mg/dL (ref 70–99)
Potassium: 3.2 mmol/L — ABNORMAL LOW (ref 3.5–5.1)
Sodium: 138 mmol/L (ref 135–145)

## 2019-01-08 LAB — GLUCOSE, CAPILLARY
Glucose-Capillary: 79 mg/dL (ref 70–99)
Glucose-Capillary: 135 mg/dL — ABNORMAL HIGH (ref 70–99)

## 2019-01-08 MED ORDER — POTASSIUM CHLORIDE CRYS ER 20 MEQ PO TBCR
40.0000 meq | EXTENDED_RELEASE_TABLET | Freq: Once | ORAL | Status: AC
  Administered 2019-01-08: 08:00:00 40 meq via ORAL
  Filled 2019-01-08: qty 2

## 2019-01-08 MED ORDER — TRESIBA FLEXTOUCH 200 UNIT/ML ~~LOC~~ SOPN: 20.0000 [IU] | PEN_INJECTOR | Freq: Every day | SUBCUTANEOUS | 0 refills | Status: AC

## 2019-01-08 MED ORDER — GABAPENTIN 300 MG PO CAPS: 600.0000 mg | ORAL_CAPSULE | Freq: Three times a day (TID) | ORAL | 0 refills | Status: DC

## 2019-01-08 MED ORDER — INSULIN ASPART 100 UNIT/ML FLEXPEN: 5.0000 [IU] | PEN_INJECTOR | Freq: Three times a day (TID) | SUBCUTANEOUS | 0 refills | Status: DC

## 2019-01-08 MED ORDER — BLOOD GLUCOSE METER KIT: PACK | 0 refills | Status: DC

## 2019-01-08 MED ORDER — INSULIN PEN NEEDLE 31G X 5 MM MISC: 0 refills | Status: DC

## 2019-01-08 MED ORDER — ATORVASTATIN CALCIUM 10 MG PO TABS: 10.0000 mg | ORAL_TABLET | Freq: Every day | ORAL | 0 refills | Status: DC

## 2019-01-08 MED ORDER — INSULIN ASPART 100 UNIT/ML ~~LOC~~ SOLN: 0.0000 [IU] | Freq: Every day | SUBCUTANEOUS | 0 refills | Status: DC

## 2019-01-08 NOTE — Discharge Instructions (Signed)
It was so nice to meet you during this hospitalization!  You came into the hospital with very high blood sugars. We gave you insulin to get your sugars down.  I have prescribed the following medications: 1. Tresiba 20 units daily 2. Novolog three times daily with meals (you should give this based on your sliding scale) 3. Gabapentin 2 tablets three times a day 4. Lipitor 10mg  daily  Take care, Dr. Nancy Marus

## 2019-01-08 NOTE — Discharge Summary (Signed)
[place] [facility] [place] NAME: Spencer [name]    MR#:  [id number]  DATE OF BIRTH:  [birth year]  DATE OF ADMISSION:  01/06/2019   ADMITTING PHYSICIAN: Loletha Grayer, MD  DATE OF DISCHARGE: 01/08/2019 11:22 AM  PRIMARY CARE PHYSICIAN: Jodi Marble, MD   ADMISSION DIAGNOSIS:  Dehydration [E86.0] Diabetic ketoacidosis without coma associated with type 1 diabetes mellitus ([place]) [E10.10] DISCHARGE DIAGNOSIS:  Active Problems:   DKA (diabetic ketoacidoses) ([place])  SECONDARY DIAGNOSIS:   Past Medical History:  Diagnosis Date  . Brain aneurysm   . Diabetes mellitus without complication ([place])   . Hypertension   . Substance abuse [facility])    HOSPITAL COURSE:   [name] is a 58 year old male with a known history of type 2 diabetes who presented to the ED with "not feeling well "and fatigue.  In the ED, his blood sugar was 854 when he had an elevated anion gap.  He was admitted for DKA.  1.   Uncontrolled type 2 diabetes -DKA resolved on insulin drip -Patient was transitioned to subcutaneous insulin without any issues. -A1c 11.3 this admission -Discharged home on Tresiba 20 units daily and NovoLog 5 units 3 times daily with meals -New blood sugar testing supplies were also sent in for patient -Patient needs close PCP follow-up.  Consider outpatient referral to endocrinology.  2. Hypertension-noted on admission. -Started on Norvasc 5 mg daily, but then blood pressures became a little bit soft. -Patient was not discharged on any blood pressure medicines -Needs close blood pressure monitoring as an outpatient  3. Diabetic neuropathy- continued gabapentin  4. Hyperlipidemia- continued atorvastatin  5. Impacted cerumen- continued debrox eardrops.  6.  Alcohol/cocaine abuse- UDS positive for cocaine -Needs continued substance cessation counseling as an outpatient  DISCHARGE CONDITIONS:  Uncontrolled type 1  diabetes Hypertension Diabetic neuropathy Hyperlipidemia Impacted cerumen Alcohol abuse Cocaine abuse CONSULTS OBTAINED:  Treatment Team:  Wendelin Bradt, Pete Pelt, MD DRUG ALLERGIES:  No Known Allergies DISCHARGE MEDICATIONS:   Allergies as of 01/08/2019   No Known Allergies     Medication List    STOP taking these medications   B-D INS SYR ULTRAFINE 1CC/31G 31G X 5/16" 1 ML Misc Generic drug:  Insulin Syringe-Needle U-100   FARXIGA 10 MG Tabs tablet Generic drug:  dapagliflozin propanediol   insulin aspart 100 UNIT/ML injection Commonly known as:  novoLOG Replaced by:  insulin aspart 100 UNIT/ML FlexPen   insulin glargine 100 UNIT/ML injection Commonly known as:  LANTUS   pantoprazole 40 MG tablet Commonly known as:  PROTONIX   traZODone 100 MG tablet Commonly known as:  DESYREL     TAKE these medications   atorvastatin 10 MG tablet Commonly known as:  LIPITOR Take 1 tablet (10 mg total) by mouth daily.   blood glucose meter kit and supplies Check blood sugar 4 times daily   cyclobenzaprine 10 MG tablet Commonly known as:  FLEXERIL Take 1 tablet (10 mg total) by mouth 3 (three) times daily as needed.   gabapentin 300 MG capsule Commonly known as:  NEURONTIN Take 2 capsules (600 mg total) by mouth 3 (three) times daily.   insulin aspart 100 UNIT/ML FlexPen Commonly known as:  NOVOLOG Inject 5 Units into the skin 3 (three) times daily with meals for 30 days. Replaces:  insulin aspart 100 UNIT/ML injection   Insulin Pen Needle 31G X 5 MM Misc Check blood sugars 4 times daily   TRESIBA FLEXTOUCH 200 UNIT/ML Sopn Generic  drug:  Insulin Degludec Inject 20 Units into the skin daily for 30 days.        DISCHARGE INSTRUCTIONS:  1.  Follow-up with PCP in 5 days 2.  Take Tresiba 20 units daily and NovoLog 5 units 3 times daily with meals DIET:  Cardiac diet and Diabetic diet DISCHARGE CONDITION:  Stable ACTIVITY:  Activity as tolerated OXYGEN:  Home  Oxygen: No.  Oxygen Delivery: room air DISCHARGE LOCATION:  home   If you experience worsening of your admission symptoms, develop shortness of breath, life threatening emergency, suicidal or homicidal thoughts you must seek medical attention immediately by calling 911 or calling your MD immediately  if symptoms less severe.  You Must read complete instructions/literature along with all the possible adverse reactions/side effects for all the Medicines you take and that have been prescribed to you. Take any new Medicines after you have completely understood and accpet all the possible adverse reactions/side effects.   Please note  You were cared for by a hospitalist during your hospital stay. If you have any questions about your discharge medications or the care you received while you were in the hospital after you are discharged, you can call the unit and asked to speak with the hospitalist on call if the hospitalist that took care of you is not available. Once you are discharged, your primary care physician will handle any further medical issues. Please note that NO REFILLS for any discharge medications will be authorized once you are discharged, as it is imperative that you return to your primary care physician (or establish a relationship with a primary care physician if you do not have one) for your aftercare needs so that they can reassess your need for medications and monitor your lab values.    On the day of Discharge:  VITAL SIGNS:  Blood pressure 130/81, pulse 65, temperature 97.7 F (36.5 C), temperature source Oral, resp. rate 16, height 6' (1.829 m), weight 66.1 kg, SpO2 99 %. PHYSICAL EXAMINATION:  GENERAL:  58 y.o.-year-old patient lying in the bed with no acute distress.  EYES: Pupils equal, round, reactive to light and accommodation. No scleral icterus. Extraocular muscles intact.  HEENT: Head atraumatic, normocephalic. Oropharynx and nasopharynx clear.  NECK:  Supple, no  jugular venous distention. No thyroid enlargement, no tenderness.  LUNGS: Normal breath sounds bilaterally, no wheezing, rales,rhonchi or crepitation. No use of accessory muscles of respiration.  CARDIOVASCULAR: RRR, S1, S2 normal. No murmurs, rubs, or gallops.  ABDOMEN: Soft, non-tender, non-distended. Bowel sounds present. No organomegaly or mass.  EXTREMITIES: No pedal edema, cyanosis, or clubbing.  NEUROLOGIC: Cranial nerves II through XII are intact. Muscle strength 5/5 in all extremities. Sensation intact. Gait not checked.  PSYCHIATRIC: The patient is alert and oriented x 3.  SKIN: No obvious rash, lesion, or ulcer.  DATA REVIEW:   CBC Recent Labs  Lab 01/07/19 0409  WBC 9.0  HGB 14.6  HCT 42.5  PLT 377    Chemistries  Recent Labs  Lab 01/08/19 0509  NA 138  K 3.2*  CL 105  CO2 25  GLUCOSE 88  BUN 28*  CREATININE 0.86  CALCIUM 8.8*     Microbiology Results  Results for orders placed or performed during the hospital encounter of 01/06/19  MRSA PCR Screening     Status: None   Collection Time: 01/06/19  8:46 PM  Result Value Ref Range Status   MRSA by PCR NEGATIVE NEGATIVE Final    Comment:  The GeneXpert MRSA Assay (FDA approved for NASAL specimens only), is one component of a comprehensive MRSA colonization surveillance program. It is not intended to diagnose MRSA infection nor to guide or monitor treatment for MRSA infections. Performed at [facility], [address] [place], [place] [address]     RADIOLOGY:  No results found.   Management plans discussed with the patient, family and they are in agreement.  CODE STATUS: Full Code   TOTAL TIME TAKING CARE OF THIS PATIENT: 40 minutes.    Berna Spare Hyder Deman M.D on 01/08/2019 at 11:49 AM  Between 7am to 6pm - Pager - [phone]  After 6pm go to [email or web address] - Proofreader  [facility] [place] [facility]  Office  [phone]  CC: Primary care physician;  Jodi Marble, MD   Note: This dictation was prepared with Dragon dictation along with smaller phrase technology. Any transcriptional errors that result from this process are unintentional.

## 2019-01-17 ENCOUNTER — Other Ambulatory Visit: Payer: Self-pay

## 2019-01-17 ENCOUNTER — Inpatient Hospital Stay
Admission: EM | Admit: 2019-01-17 | Discharge: 2019-01-18 | DRG: 638 | Disposition: A | Discharge status: Home / Self Care | Payer: Medicaid Other | Attending: Internal Medicine | Admitting: Internal Medicine

## 2019-01-17 ENCOUNTER — Emergency Department: Payer: Medicaid Other

## 2019-01-17 ENCOUNTER — Encounter: Payer: Self-pay | Admitting: Emergency Medicine

## 2019-01-17 DIAGNOSIS — M545 Low back pain: Secondary | ICD-10-CM | POA: Diagnosis present

## 2019-01-17 DIAGNOSIS — H9190 Unspecified hearing loss, unspecified ear: Secondary | ICD-10-CM | POA: Diagnosis present

## 2019-01-17 DIAGNOSIS — E860 Dehydration: Secondary | ICD-10-CM | POA: Diagnosis present

## 2019-01-17 DIAGNOSIS — I10 Essential (primary) hypertension: Secondary | ICD-10-CM | POA: Diagnosis present

## 2019-01-17 DIAGNOSIS — G8929 Other chronic pain: Secondary | ICD-10-CM | POA: Diagnosis present

## 2019-01-17 DIAGNOSIS — N179 Acute kidney failure, unspecified: Secondary | ICD-10-CM | POA: Diagnosis present

## 2019-01-17 DIAGNOSIS — M25511 Pain in right shoulder: Secondary | ICD-10-CM | POA: Diagnosis present

## 2019-01-17 DIAGNOSIS — Z8249 Family history of ischemic heart disease and other diseases of the circulatory system: Secondary | ICD-10-CM

## 2019-01-17 DIAGNOSIS — I160 Hypertensive urgency: Secondary | ICD-10-CM | POA: Diagnosis present

## 2019-01-17 DIAGNOSIS — Z9114 Patient's other noncompliance with medication regimen: Secondary | ICD-10-CM

## 2019-01-17 DIAGNOSIS — E1110 Type 2 diabetes mellitus with ketoacidosis without coma: Secondary | ICD-10-CM | POA: Diagnosis present

## 2019-01-17 DIAGNOSIS — R509 Fever, unspecified: Secondary | ICD-10-CM | POA: Diagnosis present

## 2019-01-17 DIAGNOSIS — E785 Hyperlipidemia, unspecified: Secondary | ICD-10-CM | POA: Diagnosis present

## 2019-01-17 DIAGNOSIS — Z794 Long term (current) use of insulin: Secondary | ICD-10-CM

## 2019-01-17 DIAGNOSIS — R739 Hyperglycemia, unspecified: Secondary | ICD-10-CM | POA: Diagnosis not present

## 2019-01-17 DIAGNOSIS — E1010 Type 1 diabetes mellitus with ketoacidosis without coma: Secondary | ICD-10-CM | POA: Diagnosis present

## 2019-01-17 DIAGNOSIS — F1410 Cocaine abuse, uncomplicated: Secondary | ICD-10-CM | POA: Diagnosis present

## 2019-01-17 DIAGNOSIS — F1010 Alcohol abuse, uncomplicated: Secondary | ICD-10-CM | POA: Diagnosis present

## 2019-01-17 DIAGNOSIS — M542 Cervicalgia: Secondary | ICD-10-CM | POA: Diagnosis present

## 2019-01-17 DIAGNOSIS — M25512 Pain in left shoulder: Secondary | ICD-10-CM | POA: Diagnosis present

## 2019-01-17 DIAGNOSIS — E0810 Diabetes mellitus due to underlying condition with ketoacidosis without coma: Secondary | ICD-10-CM

## 2019-01-17 DIAGNOSIS — Z716 Tobacco abuse counseling: Secondary | ICD-10-CM | POA: Diagnosis not present

## 2019-01-17 DIAGNOSIS — Z79899 Other long term (current) drug therapy: Secondary | ICD-10-CM

## 2019-01-17 DIAGNOSIS — F17210 Nicotine dependence, cigarettes, uncomplicated: Secondary | ICD-10-CM | POA: Diagnosis present

## 2019-01-17 LAB — GLUCOSE, CAPILLARY
GLUCOSE-CAPILLARY: 231 mg/dL — AB (ref 70–99)
GLUCOSE-CAPILLARY: 86 mg/dL (ref 70–99)
Glucose-Capillary: 109 mg/dL — ABNORMAL HIGH (ref 70–99)
Glucose-Capillary: 121 mg/dL — ABNORMAL HIGH (ref 70–99)
Glucose-Capillary: 160 mg/dL — ABNORMAL HIGH (ref 70–99)
Glucose-Capillary: 160 mg/dL — ABNORMAL HIGH (ref 70–99)
Glucose-Capillary: 162 mg/dL — ABNORMAL HIGH (ref 70–99)
Glucose-Capillary: 177 mg/dL — ABNORMAL HIGH (ref 70–99)
Glucose-Capillary: 204 mg/dL — ABNORMAL HIGH (ref 70–99)
Glucose-Capillary: 250 mg/dL — ABNORMAL HIGH (ref 70–99)
Glucose-Capillary: 284 mg/dL — ABNORMAL HIGH (ref 70–99)
Glucose-Capillary: 303 mg/dL — ABNORMAL HIGH (ref 70–99)
Glucose-Capillary: 312 mg/dL — ABNORMAL HIGH (ref 70–99)
Glucose-Capillary: 598 mg/dL (ref 70–99)

## 2019-01-17 LAB — INFLUENZA PANEL BY PCR (TYPE A & B)
INFLAPCR: NEGATIVE
Influenza B By PCR: NEGATIVE

## 2019-01-17 LAB — CBC WITH DIFFERENTIAL/PLATELET
Abs Immature Granulocytes: 0.06 10*3/uL (ref 0.00–0.07)
Abs Immature Granulocytes: 0.07 10*3/uL (ref 0.00–0.07)
BASOS PCT: 0 %
Basophils Absolute: 0 10*3/uL (ref 0.0–0.1)
Basophils Absolute: 0 10*3/uL (ref 0.0–0.1)
Basophils Relative: 0 %
EOS PCT: 0 %
EOS PCT: 0 %
Eosinophils Absolute: 0 10*3/uL (ref 0.0–0.5)
Eosinophils Absolute: 0 10*3/uL (ref 0.0–0.5)
HCT: 46.9 % (ref 39.0–52.0)
HCT: 42 % (ref 39.0–52.0)
Hemoglobin: 15.5 g/dL (ref 13.0–17.0)
Hemoglobin: 13.8 g/dL (ref 13.0–17.0)
Immature Granulocytes: 0 %
Immature Granulocytes: 1 %
LYMPHS PCT: 7 %
Lymphocytes Relative: 9 %
Lymphs Abs: 1 10*3/uL (ref 0.7–4.0)
Lymphs Abs: 1.3 10*3/uL (ref 0.7–4.0)
MCH: 31.3 pg (ref 26.0–34.0)
MCH: 31.1 pg (ref 26.0–34.0)
MCHC: 33 g/dL (ref 30.0–36.0)
MCHC: 32.9 g/dL (ref 30.0–36.0)
MCV: 94.7 fL (ref 80.0–100.0)
MCV: 94.6 fL (ref 80.0–100.0)
Monocytes Absolute: 0.6 10*3/uL (ref 0.1–1.0)
Monocytes Absolute: 0.8 10*3/uL (ref 0.1–1.0)
Monocytes Relative: 4 %
Monocytes Relative: 5 %
Neutro Abs: 13.9 10*3/uL — ABNORMAL HIGH (ref 1.7–7.7)
Neutro Abs: 12.5 10*3/uL — ABNORMAL HIGH (ref 1.7–7.7)
Neutrophils Relative %: 89 %
Neutrophils Relative %: 85 %
Platelets: 401 10*3/uL — ABNORMAL HIGH (ref 150–400)
Platelets: 322 10*3/uL (ref 150–400)
RBC: 4.95 MIL/uL (ref 4.22–5.81)
RBC: 4.44 MIL/uL (ref 4.22–5.81)
RDW: 12.4 % (ref 11.5–15.5)
RDW: 12.5 % (ref 11.5–15.5)
WBC: 15.6 10*3/uL — ABNORMAL HIGH (ref 4.0–10.5)
WBC: 14.6 10*3/uL — ABNORMAL HIGH (ref 4.0–10.5)
nRBC: 0 % (ref 0.0–0.2)
nRBC: 0 % (ref 0.0–0.2)

## 2019-01-17 LAB — URINALYSIS, COMPLETE (UACMP) WITH MICROSCOPIC
BACTERIA UA: NONE SEEN
Bilirubin Urine: NEGATIVE
Glucose, UA: >=500 mg/dL — AB
Ketones, ur: 80 mg/dL — AB
Leukocytes,Ua: NEGATIVE
Nitrite: NEGATIVE
Protein, ur: NEGATIVE mg/dL
Specific Gravity, Urine: 1.018 (ref 1.005–1.030)
Squamous Epithelial / HPF: NONE SEEN (ref 0–5)
WBC UA: NONE SEEN WBC/hpf (ref 0–5)
pH: 5 (ref 5.0–8.0)

## 2019-01-17 LAB — COMPREHENSIVE METABOLIC PANEL
ALK PHOS: 102 U/L (ref 38–126)
ALT: 18 U/L (ref 0–44)
AST: 15 U/L (ref 15–41)
Albumin: 4.9 g/dL (ref 3.5–5.0)
Anion gap: 32 — ABNORMAL HIGH (ref 5–15)
BUN: 38 mg/dL — ABNORMAL HIGH (ref 6–20)
CO2: 8 mmol/L — AB (ref 22–32)
Calcium: 9.4 mg/dL (ref 8.9–10.3)
Chloride: 93 mmol/L — ABNORMAL LOW (ref 98–111)
Creatinine, Ser: 1.97 mg/dL — ABNORMAL HIGH (ref 0.61–1.24)
GFR calc Af Amer: 42 mL/min — ABNORMAL LOW (ref >60)
GFR calc non Af Amer: 36 mL/min — ABNORMAL LOW (ref >60)
GLUCOSE: 590 mg/dL — AB (ref 70–99)
Potassium: 4.8 mmol/L (ref 3.5–5.1)
SODIUM: 133 mmol/L — AB (ref 135–145)
Total Bilirubin: 1.6 mg/dL — ABNORMAL HIGH (ref 0.3–1.2)
Total Protein: 8.2 g/dL — ABNORMAL HIGH (ref 6.5–8.1)

## 2019-01-17 LAB — URINE DRUG SCREEN, QUALITATIVE (ARMC ONLY)
Amphetamines, Ur Screen: NOT DETECTED
Barbiturates, Ur Screen: NOT DETECTED
Benzodiazepine, Ur Scrn: NOT DETECTED
Cannabinoid 50 Ng, Ur ~~LOC~~: NOT DETECTED
Cocaine Metabolite,Ur ~~LOC~~: POSITIVE — AB
MDMA (Ecstasy)Ur Screen: NOT DETECTED
Methadone Scn, Ur: NOT DETECTED
Opiate, Ur Screen: NOT DETECTED
Phencyclidine (PCP) Ur S: NOT DETECTED
TRICYCLIC, UR SCREEN: NOT DETECTED

## 2019-01-17 LAB — BASIC METABOLIC PANEL
Anion gap: 18 — ABNORMAL HIGH (ref 5–15)
Anion gap: 8 (ref 5–15)
Anion gap: 10 (ref 5–15)
BUN: 28 mg/dL — ABNORMAL HIGH (ref 6–20)
BUN: 24 mg/dL — ABNORMAL HIGH (ref 6–20)
BUN: 25 mg/dL — ABNORMAL HIGH (ref 6–20)
CO2: 10 mmol/L — ABNORMAL LOW (ref 22–32)
CO2: 16 mmol/L — ABNORMAL LOW (ref 22–32)
CO2: 17 mmol/L — ABNORMAL LOW (ref 22–32)
CREATININE: 1.2 mg/dL (ref 0.61–1.24)
CREATININE: 0.92 mg/dL (ref 0.61–1.24)
Calcium: 8.5 mg/dL — ABNORMAL LOW (ref 8.9–10.3)
Calcium: 8.5 mg/dL — ABNORMAL LOW (ref 8.9–10.3)
Calcium: 8.4 mg/dL — ABNORMAL LOW (ref 8.9–10.3)
Chloride: 111 mmol/L (ref 98–111)
Chloride: 115 mmol/L — ABNORMAL HIGH (ref 98–111)
Chloride: 111 mmol/L (ref 98–111)
Creatinine, Ser: 0.88 mg/dL (ref 0.61–1.24)
GFR calc Af Amer: >60 mL/min (ref >60)
GFR calc Af Amer: >60 mL/min (ref >60)
GFR calc Af Amer: >60 mL/min (ref >60)
GFR calc non Af Amer: >60 mL/min (ref >60)
GFR calc non Af Amer: >60 mL/min (ref >60)
Glucose, Bld: 249 mg/dL — ABNORMAL HIGH (ref 70–99)
Glucose, Bld: 147 mg/dL — ABNORMAL HIGH (ref 70–99)
Glucose, Bld: 216 mg/dL — ABNORMAL HIGH (ref 70–99)
Potassium: 3.8 mmol/L (ref 3.5–5.1)
Potassium: 3.7 mmol/L (ref 3.5–5.1)
Potassium: 4.1 mmol/L (ref 3.5–5.1)
SODIUM: 139 mmol/L (ref 135–145)
SODIUM: 138 mmol/L (ref 135–145)
Sodium: 139 mmol/L (ref 135–145)

## 2019-01-17 LAB — BLOOD GAS, VENOUS
Acid-base deficit: 21 mmol/L — ABNORMAL HIGH (ref 0.0–2.0)
Bicarbonate: 7.3 mmol/L — ABNORMAL LOW (ref 20.0–28.0)
O2 Saturation: 42.8 %
Patient temperature: 37
pCO2, Ven: 24 mmHg — ABNORMAL LOW (ref 44.0–60.0)
pH, Ven: 7.09 — CL (ref 7.250–7.430)
pO2, Ven: 35 mmHg (ref 32.0–45.0)

## 2019-01-17 LAB — TROPONIN I: Troponin I: <0.03 ng/mL (ref <0.03)

## 2019-01-17 MED ORDER — METOCLOPRAMIDE HCL 5 MG/ML IJ SOLN: 5.0000 mg | Freq: Four times a day (QID) | INTRAMUSCULAR | Status: DC | PRN

## 2019-01-17 MED ORDER — SODIUM CHLORIDE 0.9 % IV BOLUS
1000.0000 mL | Freq: Once | INTRAVENOUS | Status: AC
  Administered 2019-01-17: 1000 mL via INTRAVENOUS

## 2019-01-17 MED ORDER — INSULIN ASPART 100 UNIT/ML ~~LOC~~ SOLN
0.0000 [IU] | Freq: Every day | SUBCUTANEOUS | Status: DC
  Administered 2019-01-17: 23:00:00 3 [IU] via SUBCUTANEOUS
  Filled 2019-01-17: qty 1

## 2019-01-17 MED ORDER — INSULIN ASPART 100 UNIT/ML ~~LOC~~ SOLN
4.0000 [IU] | Freq: Three times a day (TID) | SUBCUTANEOUS | Status: DC
  Administered 2019-01-18 (×2): 4 [IU] via SUBCUTANEOUS
  Filled 2019-01-17: qty 1

## 2019-01-17 MED ORDER — INSULIN ASPART 100 UNIT/ML ~~LOC~~ SOLN
0.0000 [IU] | Freq: Three times a day (TID) | SUBCUTANEOUS | Status: DC
  Administered 2019-01-18: 12:00:00 15 [IU] via SUBCUTANEOUS
  Administered 2019-01-18: 11 [IU] via SUBCUTANEOUS
  Filled 2019-01-17 (×2): qty 1

## 2019-01-17 MED ORDER — NICOTINE 21 MG/24HR TD PT24
21.0000 mg | MEDICATED_PATCH | Freq: Every day | TRANSDERMAL | Status: DC
  Administered 2019-01-17 – 2019-01-18 (×2): 21 mg via TRANSDERMAL
  Filled 2019-01-17 (×2): qty 1

## 2019-01-17 MED ORDER — DEXTROSE 50 % IV SOLN
25.0000 mL | INTRAVENOUS | Status: DC | PRN
  Filled 2019-01-17: qty 50

## 2019-01-17 MED ORDER — ONDANSETRON HCL 4 MG/2ML IJ SOLN
4.0000 mg | Freq: Four times a day (QID) | INTRAMUSCULAR | Status: DC
  Administered 2019-01-17 (×2): 4 mg via INTRAVENOUS
  Filled 2019-01-17 (×2): qty 2

## 2019-01-17 MED ORDER — POTASSIUM CHLORIDE 10 MEQ/100ML IV SOLN
10.0000 meq | INTRAVENOUS | Status: DC
  Administered 2019-01-17: 10 meq via INTRAVENOUS
  Filled 2019-01-17 (×2): qty 100

## 2019-01-17 MED ORDER — INSULIN GLARGINE 100 UNIT/ML ~~LOC~~ SOLN
12.0000 [IU] | Freq: Every day | SUBCUTANEOUS | Status: DC
  Administered 2019-01-17 – 2019-01-18 (×2): 12 [IU] via SUBCUTANEOUS
  Filled 2019-01-17 (×5): qty 0.12

## 2019-01-17 MED ORDER — DEXTROSE-NACL 5-0.45 % IV SOLN: INTRAVENOUS | Status: DC

## 2019-01-17 MED ORDER — SODIUM CHLORIDE 0.9 % IV SOLN: INTRAVENOUS | Status: DC

## 2019-01-17 MED ORDER — AMLODIPINE BESYLATE 5 MG PO TABS
5.0000 mg | ORAL_TABLET | Freq: Every day | ORAL | Status: DC
  Administered 2019-01-17 – 2019-01-18 (×2): 5 mg via ORAL
  Filled 2019-01-17 (×2): qty 1

## 2019-01-17 MED ORDER — ENOXAPARIN SODIUM 40 MG/0.4ML ~~LOC~~ SOLN: 40.0000 mg | SUBCUTANEOUS | Status: DC

## 2019-01-17 MED ORDER — INSULIN ASPART 100 UNIT/ML ~~LOC~~ SOLN
10.0000 [IU] | Freq: Once | SUBCUTANEOUS | Status: AC
  Administered 2019-01-17: 10 [IU] via INTRAVENOUS
  Filled 2019-01-17: qty 1

## 2019-01-17 MED ORDER — INSULIN REGULAR BOLUS VIA INFUSION
0.0000 [IU] | Freq: Three times a day (TID) | INTRAVENOUS | Status: DC
  Filled 2019-01-17: qty 10

## 2019-01-17 MED ORDER — INSULIN REGULAR(HUMAN) IN NACL 100-0.9 UT/100ML-% IV SOLN
INTRAVENOUS | Status: DC
  Administered 2019-01-17: 2.4 [IU]/h via INTRAVENOUS
  Filled 2019-01-17 (×2): qty 100

## 2019-01-17 MED ORDER — CYCLOBENZAPRINE HCL 10 MG PO TABS
10.0000 mg | ORAL_TABLET | Freq: Three times a day (TID) | ORAL | Status: DC | PRN
  Filled 2019-01-17: qty 1

## 2019-01-17 MED ORDER — GABAPENTIN 300 MG PO CAPS
300.0000 mg | ORAL_CAPSULE | Freq: Three times a day (TID) | ORAL | Status: DC
  Administered 2019-01-17 – 2019-01-18 (×3): 300 mg via ORAL
  Filled 2019-01-17 (×4): qty 1

## 2019-01-17 MED ORDER — ATORVASTATIN CALCIUM 20 MG PO TABS
10.0000 mg | ORAL_TABLET | Freq: Every day | ORAL | Status: DC
  Administered 2019-01-17: 18:00:00 10 mg via ORAL
  Filled 2019-01-17: qty 1

## 2019-01-17 MED ORDER — INSULIN REGULAR(HUMAN) IN NACL 100-0.9 UT/100ML-% IV SOLN: INTRAVENOUS | Status: DC

## 2019-01-17 MED ORDER — DEXTROSE-NACL 5-0.45 % IV SOLN
INTRAVENOUS | Status: DC
  Administered 2019-01-17: 08:00:00 via INTRAVENOUS

## 2019-01-17 MED ORDER — LABETALOL HCL 5 MG/ML IV SOLN: 20.0000 mg | INTRAVENOUS | Status: DC | PRN

## 2019-01-17 MED ORDER — LORAZEPAM 2 MG/ML IJ SOLN: 1.0000 mg | INTRAMUSCULAR | Status: DC | PRN

## 2019-01-17 MED ORDER — SODIUM CHLORIDE 0.9 % IV SOLN
INTRAVENOUS | Status: DC
  Administered 2019-01-17: 05:00:00 via INTRAVENOUS

## 2019-01-17 NOTE — ED Notes (Addendum)
ED TO INPATIENT HANDOFF REPORT  ED Nurse Name and Phone #:  Terance Hart, RN  [phone]  S Name/Age/Gender Spencer [name] 58 y.o. male Room/Bed: ED10A/ED10A  Code Status   Code Status: Full Code  Home/SNF/Other Home Patient oriented to: self, place, time and situation Is this baseline? Yes   Triage Complete: Triage complete  Chief Complaint Difficulty Hearing/Breathing  Triage Note Pt to triage via w/c with no distress noted; pt reports fluctuating glucose x 3 days accomp by muffled hearing and ringing in ears, increased urination and thirst and weakness; seen recently for same and was hospitalized with DKA   Allergies No Known Allergies  Level of Care/Admitting Diagnosis ED Disposition    ED Disposition Condition Comment   Admit  Hospital Area: [facility] [[id number]]  Level of Care: Stepdown [14]  Diagnosis: DKA (diabetic ketoacidoses) [facility]) [[id number]]  Admitting Physician: Hannah Beat [[id number]]  Attending Physician: Hannah Beat [[id number]]  Estimated length of stay: past midnight tomorrow  Certification:: I certify this patient will need inpatient services for at least 2 midnights  PT Class (Do Not Modify): Inpatient [101]  PT Acc Code (Do Not Modify): Private [1]       B Medical/Surgery History Past Medical History:  Diagnosis Date  . Brain aneurysm   . Diabetes mellitus without complication (HCC)   . Hypertension   . Substance abuse [facility])    Past Surgical History:  Procedure Laterality Date  . HERNIA REPAIR    . NECK SURGERY       A IV Location/Drains/Wounds Patient Lines/Drains/Airways Status   Active Line/Drains/Airways    Name:   Placement date:   Placement time:   Site:   Days:   Peripheral IV 01/17/19 Left Forearm   01/17/19    0049    Forearm   less than 1          Intake/Output Last 24 hours  Intake/Output Summary (Last 24 hours) at 01/17/2019 0510 Last data filed at 01/17/2019 0343 Gross per 24 hour  Intake 3000  ml  Output -  Net 3000 ml    Labs/Imaging Results for orders placed or performed during the hospital encounter of 01/17/19 (from the past 48 hour(s))  Glucose, capillary     Status: Abnormal   Collection Time: 01/17/19 12:29 AM  Result Value Ref Range   Glucose-Capillary 598 (HH) 70 - 99 mg/dL   Comment 1 Notify RN   CBC with Differential     Status: Abnormal   Collection Time: 01/17/19 12:49 AM  Result Value Ref Range   WBC 15.6 (H) 4.0 - 10.5 K/uL   RBC 4.95 4.22 - 5.81 MIL/uL   Hemoglobin 15.5 13.0 - 17.0 g/dL   HCT 08.6 57.8 - 46.9 %   MCV 94.7 80.0 - 100.0 fL   MCH 31.3 26.0 - 34.0 pg   MCHC 33.0 30.0 - 36.0 g/dL   RDW 62.9 52.8 - 41.3 %   Platelets 401 (H) 150 - 400 K/uL   nRBC 0.0 0.0 - 0.2 %   Neutrophils Relative % 89 %   Neutro Abs 13.9 (H) 1.7 - 7.7 K/uL   Lymphocytes Relative 7 %   Lymphs Abs 1.0 0.7 - 4.0 K/uL   Monocytes Relative 4 %   Monocytes Absolute 0.6 0.1 - 1.0 K/uL   Eosinophils Relative 0 %   Eosinophils Absolute 0.0 0.0 - 0.5 K/uL   Basophils Relative 0 %   Basophils Absolute 0.0 0.0 - 0.1  K/uL   Immature Granulocytes 0 %   Abs Immature Granulocytes 0.06 0.00 - 0.07 K/uL    Comment: Performed at [facility], [address] [place], [place] [address]  Comprehensive metabolic panel     Status: Abnormal   Collection Time: 01/17/19 12:49 AM  Result Value Ref Range   Sodium 133 (L) 135 - 145 mmol/L    Comment: LYTES REPEATED TO CONFIRM SMA   Potassium 4.8 3.5 - 5.1 mmol/L   Chloride 93 (L) 98 - 111 mmol/L   CO2 8 (L) 22 - 32 mmol/L   Glucose, Bld 590 (HH) 70 - 99 mg/dL    Comment: CRITICAL RESULT CALLED TO, READ BACK BY AND VERIFIED WITH JOHN HOFFMASTER AT 0150 01/17/2019 SMA    BUN 38 (H) 6 - 20 mg/dL   Creatinine, Ser 3.42 (H) 0.61 - 1.24 mg/dL   Calcium 9.4 8.9 - 87.6 mg/dL   Total Protein 8.2 (H) 6.5 - 8.1 g/dL   Albumin 4.9 3.5 - 5.0 g/dL   AST 15 15 - 41 U/L   ALT 18 0 - 44 U/L   Alkaline Phosphatase 102 38 - 126 U/L    Total Bilirubin 1.6 (H) 0.3 - 1.2 mg/dL   GFR calc non Af Amer 36 (L) >60 mL/min   GFR calc Af Amer 42 (L) >60 mL/min   Anion gap 32 (H) 5 - 15    Comment: Performed at [facility], [address] [place], [place] [address]  Troponin I - ONCE - STAT     Status: None   Collection Time: 01/17/19 12:49 AM  Result Value Ref Range   Troponin I <0.03 <0.03 ng/mL    Comment: Performed at [facility], [address] [place], [place] [address]  Blood gas, venous     Status: Abnormal   Collection Time: 01/17/19 12:49 AM  Result Value Ref Range   pH, Ven 7.09 (LL) 7.250 - 7.430    Comment: CRITICAL RESULT CALLED TO, READ BACK BY AND VERIFIED WITH: DR Manson Passey [id number] 0110 DT    pCO2, Ven 24 (L) 44.0 - 60.0 mmHg   pO2, Ven 35.0 32.0 - 45.0 mmHg   Bicarbonate 7.3 (L) 20.0 - 28.0 mmol/L   Acid-base deficit 21.0 (H) 0.0 - 2.0 mmol/L   O2 Saturation 42.8 %   Patient temperature 37.0    Collection site LINE    Sample type VENOUS     Comment: Performed at [facility], [address] [place], [place] [address]  Urinalysis, Complete w Microscopic     Status: Abnormal   Collection Time: 01/17/19  2:02 AM  Result Value Ref Range   Color, Urine STRAW (A) YELLOW   APPearance CLEAR (A) CLEAR   Specific Gravity, Urine 1.018 1.005 - 1.030   pH 5.0 5.0 - 8.0   Glucose, UA >=500 (A) NEGATIVE mg/dL   Hgb urine dipstick SMALL (A) NEGATIVE   Bilirubin Urine NEGATIVE NEGATIVE   Ketones, ur 80 (A) NEGATIVE mg/dL   Protein, ur NEGATIVE NEGATIVE mg/dL   Nitrite NEGATIVE NEGATIVE   Leukocytes,Ua NEGATIVE NEGATIVE   RBC / HPF 0-5 0 - 5 RBC/hpf   WBC, UA NONE SEEN 0 - 5 WBC/hpf   Bacteria, UA NONE SEEN NONE SEEN   Squamous Epithelial / LPF NONE SEEN 0 - 5   Hyaline Casts, UA PRESENT     Comment: Performed at [facility], [address] [place], [place] [address]  Urine Drug Screen,  Qualitative ([facility] only)     Status: Abnormal   Collection Time: 01/17/19  2:02 AM   Result Value Ref Range   Tricyclic, Ur Screen NONE DETECTED NONE DETECTED   Amphetamines, Ur Screen NONE DETECTED NONE DETECTED   MDMA (Ecstasy)Ur Screen NONE DETECTED NONE DETECTED   Cocaine Metabolite,Ur [place] POSITIVE (A) NONE DETECTED   Opiate, Ur Screen NONE DETECTED NONE DETECTED   Phencyclidine (PCP) Ur S NONE DETECTED NONE DETECTED   Cannabinoid 50 Ng, Ur [place] NONE DETECTED NONE DETECTED   Barbiturates, Ur Screen NONE DETECTED NONE DETECTED   Benzodiazepine, Ur Scrn NONE DETECTED NONE DETECTED   Methadone Scn, Ur NONE DETECTED NONE DETECTED    Comment: (NOTE) Tricyclics + metabolites, urine    Cutoff 1000 ng/mL Amphetamines + metabolites, urine  Cutoff 1000 ng/mL MDMA (Ecstasy), urine              Cutoff 500 ng/mL Cocaine Metabolite, urine          Cutoff 300 ng/mL Opiate + metabolites, urine        Cutoff 300 ng/mL Phencyclidine (PCP), urine         Cutoff 25 ng/mL Cannabinoid, urine                 Cutoff 50 ng/mL Barbiturates + metabolites, urine  Cutoff 200 ng/mL Benzodiazepine, urine              Cutoff 200 ng/mL Methadone, urine                   Cutoff 300 ng/mL The urine drug screen provides only a preliminary, unconfirmed analytical test result and should not be used for non-medical purposes. Clinical consideration and professional judgment should be applied to any positive drug screen result due to possible interfering substances. A more specific alternate chemical method must be used in order to obtain a confirmed analytical result. Gas chromatography / mass spectrometry (GC/MS) is the preferred confirmat ory method. Performed at [facility], [address] [place], [place] [address]   Glucose, capillary     Status: Abnormal   Collection Time: 01/17/19  3:53 AM  Result Value Ref Range   Glucose-Capillary 312 (H) 70 - 99 mg/dL  Glucose, capillary     Status: Abnormal   Collection Time: 01/17/19  5:02 AM  Result Value Ref Range   Glucose-Capillary  303 (H) 70 - 99 mg/dL   Dg Chest 2 View  Result Date: 01/17/2019 CLINICAL DATA:  Fluctuating glucose for 3 days. Difficulty hearing and reading any years. Increased urination, increased thirst, and weakness. History of hypertension and smoking. EXAM: CHEST - 2 VIEW COMPARISON:  10/25/2018 FINDINGS: Normal heart size and pulmonary vascularity. No focal airspace disease or consolidation in the lungs. No blunting of costophrenic angles. No pneumothorax. Mediastinal contours appear intact. Medical foreign bodies demonstrated over the posterior right chest. Postoperative changes in the cervical spine. IMPRESSION: No active cardiopulmonary disease. Electronically Signed   By: Burman Nieves M.D.   On: 01/17/2019 01:53    Pending Labs Unresulted Labs (From admission, onward)    Start     Ordered   01/17/19 0500  CBC with Differential  Once,   STAT     01/17/19 0412   01/17/19 0413  Basic metabolic panel  STAT Now then every 4 hours ,   STAT     01/17/19 0412          Vitals/Pain Today's Vitals   01/17/19 0300 01/17/19 0430  01/17/19 0500 01/17/19 0506  BP: (!) 161/82  (!) 154/83   Pulse: 91     Resp: (!) 22 19 (!) 22   Temp:  97.8 F (36.6 C)    TempSrc:  Oral    SpO2: 100%     Weight:      Height:     (1.854 m)  PainSc:        Isolation Precautions No active isolations  Medications Medications  dextrose 5 %-0.45 % sodium chloride infusion ( Intravenous Not Given 01/17/19 0354)  insulin regular bolus via infusion 0-10 Units (has no administration in time range)  dextrose 50 % solution 25 mL (has no administration in time range)  0.9 %  sodium chloride infusion ( Intravenous Not Given 01/17/19 0432)  atorvastatin (LIPITOR) tablet 10 mg (has no administration in time range)  cyclobenzaprine (FLEXERIL) tablet 10 mg (has no administration in time range)  gabapentin (NEURONTIN) capsule 300 mg (has no administration in time range)  0.9 %  sodium chloride infusion ( Intravenous  New Bag/Given 01/17/19 0500)  dextrose 5 %-0.45 % sodium chloride infusion ( Intravenous Not Given 01/17/19 0431)  insulin regular, human (MYXREDLIN) 100 units/ 100 mL infusion (2.4 Units/hr Intravenous New Bag/Given 01/17/19 0509)  enoxaparin (LOVENOX) injection 40 mg (has no administration in time range)  potassium chloride 10 mEq in 100 mL IVPB (has no administration in time range)  ondansetron (ZOFRAN) injection 4 mg (has no administration in time range)  metoCLOPramide (REGLAN) injection 5 mg (has no administration in time range)  amLODipine (NORVASC) tablet 5 mg (has no administration in time range)  labetalol (NORMODYNE,TRANDATE) injection 20 mg (has no administration in time range)  LORazepam (ATIVAN) injection 1 mg (has no administration in time range)  sodium chloride 0.9 % bolus 1,000 mL (0 mLs Intravenous Stopped 01/17/19 0342)  sodium chloride 0.9 % bolus 1,000 mL (0 mLs Intravenous Stopped 01/17/19 0343)  sodium chloride 0.9 % bolus 1,000 mL (0 mLs Intravenous Stopped 01/17/19 0343)  insulin aspart (novoLOG) injection 10 Units (10 Units Intravenous Given 01/17/19 0241)    Mobility walks Low fall risk   Focused Assessments Neuro Assessment Handoff:  Swallow screen pass? Yes          Neuro Assessment:   Neuro Checks:      Last Documented NIHSS Modified Score:   Has TPA been given? No If patient is a Neuro Trauma and patient is going to OR before floor call report to 4N Charge nurse: [phone] or [phone]     R Recommendations: See Admitting Provider Note  Report given to:   Additional Notes: Last BG 303, pt on drip at 2.4 u/hr.

## 2019-01-17 NOTE — Progress Notes (Signed)
Initial Nutrition Assessment  DOCUMENTATION CODES:   Severe malnutrition in context of chronic illness  INTERVENTION:   Once diet advanced, recommend:  Nepro Shake po TID, each supplement provides 425 kcal and 19 grams protein  MVI, thiamine and folic acid in setting of etoh abuse  Pt likely at high refeeding risk; recommend monitor K, Mg and P labs daily until stable once diet advanced.   NUTRITION DIAGNOSIS:   Severe Malnutrition related to chronic illness(uncontrolled DM, substance abuse ) as evidenced by 15 percent weight loss in <3 months, severe fat depletions, severe muscle depletions.  GOAL:   Patient will meet greater than or equal to 90% of their needs  MONITOR:   Diet advancement, Labs, Weight trends, Skin, I & O's  REASON FOR ASSESSMENT:   Other (Comment)(low BMI)    ASSESSMENT:   58 y.o. male with a known history of diabetes mellitus, hypertension, ongoing EtOH, cocaine and tobacco abuse and brain aneurysm who presented to the emergency room with acute onset of intractable nausea and vomiting and elevated blood glucose levels.   Met with pt in room today. RD familiar with this pt from previous admits. Pt reports good appetite and oral intake at baseline but reports that despite his good appetite he is continuing to loose weight. Per chart, pt with 24lb(15%) weight loss in less than 3 months; this is significant. Pt is non-compliant with diet at home and continues to drink etoh. Pt reports that he enjoys supplements but does not drink these at home r/t costs. Pt is willing to drink supplements while in hospital; recommend Nepro in setting of DKA as this is carb steady. Pt likely at high refeeding risk; recommend monitor K, Mg and P labs daily until stable once diet advanced.   Medications reviewed and include: lovenox, insulin, zofran, NaCl w/ 5% dextrose _0 /hr  Labs reviewed: K 3.8 wnl, BUN 28(H) Wbc- 14.6(H) cbgs- 250, 231, 204, 177, 160 x 24 hrs AIC-  11.3- 3/2   NUTRITION - FOCUSED PHYSICAL EXAM:    Most Recent Value  Orbital Region  Moderate depletion  Upper Arm Region  Severe depletion  Thoracic and Lumbar Region  Severe depletion  Buccal Region  Moderate depletion  Temple Region  Moderate depletion  Clavicle Bone Region  Severe depletion  Clavicle and Acromion Bone Region  Severe depletion  Scapular Bone Region  Severe depletion  Dorsal Hand  Severe depletion  Patellar Region  Severe depletion  Anterior Thigh Region  Severe depletion  Posterior Calf Region  Severe depletion  Edema (RD Assessment)  None  Hair  Reviewed  Eyes  Reviewed  Mouth  Reviewed  Skin  Reviewed  Nails  Reviewed     Diet Order:   Diet Order            Diet NPO time specified  Diet effective now             EDUCATION NEEDS:   Education needs have been addressed  Skin:  Skin Assessment: Reviewed RN Assessment  Last BM:  3/2- Constipation   Height:   Ht Readings from Last 1 Encounters:  01/17/19 _1  (1.854 m)    Weight:   Wt Readings from Last 1 Encounters:  01/17/19 61.8 kg    Ideal Body Weight:  83.6 kg  BMI:  Body mass index is 17.98 kg/m.  Estimated Nutritional Needs:   Kcal:  1900-2200kcal/day   Protein:  93-105g/day   Fluid:  1.9L/day   Koleen Distance MS, RD,  LDN Pager #- [phone] Office#- [phone] After Hours Pager: [phone]

## 2019-01-17 NOTE — Progress Notes (Signed)
Patient admitted on DKA protocol.  This is recurrent (he had DKA earlier this month).  RN notes that he had a low-grade fever and mild respiratory symptoms.  Flu PCR ordered and is negative.   Follow DKA protocol and transfer to MedSurg floor once off insulin infusion  Billy Fischer, MD PCCM service Mobile [phone] Pager [phone] 01/17/2019 11:30 AM

## 2019-01-17 NOTE — Progress Notes (Signed)
Inpatient Diabetes Program Recommendations  AACE/ADA: New Consensus Statement on Inpatient Glycemic Control (2015)  Target Ranges:  Prepandial:   less than 140 mg/dL      Peak postprandial:   less than 180 mg/dL (1-2 hours)      Critically ill patients:  140 - 180 mg/dL   Results for Spencer [name], Spencer [name] (MRN [id number]) as of 01/17/2019 07:46  Ref. Range 01/17/2019 00:49  Sodium Latest Ref Range: 135 - 145 mmol/L 133 (L)  Potassium Latest Ref Range: 3.5 - 5.1 mmol/L 4.8  Chloride Latest Ref Range: 98 - 111 mmol/L 93 (L)  CO2 Latest Ref Range: 22 - 32 mmol/L 8 (L)  Glucose Latest Ref Range: 70 - 99 mg/dL 627 (HH)  BUN Latest Ref Range: 6 - 20 mg/dL 38 (H)  Creatinine Latest Ref Range: 0.61 - 1.24 mg/dL 0.35 (H)  Calcium Latest Ref Range: 8.9 - 10.3 mg/dL 9.4  Anion gap Latest Ref Range: 5 - 15  32 (H)   Results for Spencer [name], Spencer [name] (MRN [id number]) as of 01/17/2019 07:46  Ref. Range 10/25/2018 14:18 01/06/2019 17:30  Hemoglobin A1C Latest Ref Range: 4.8 - 5.6 % 9.9 (H)  (237 mg/dl) 93.7 (H)  (169 mg/dl)   Results for Spencer [name], Spencer [name] (MRN [id number]) as of 01/17/2019 07:46  Ref. Range 01/17/2019 02:02  Cocaine Metabolite,Ur [place] Latest Ref Range: NONE DETECTED  POSITIVE (A)    Admit with: DKA  History: DM, ETOH, Cocaine Abuse, Brain Aneurysm  Home DM Meds: Tresiba 20 units Daily        Novolog 5 units TID with meals  Current Orders: IV Insulin Drip      Just discharged home from [facility] on 03/04 after admission for DKA (admitted on 03/02).  Diabetes Coordinator spoke at length with patient on 01/07/2019 (see note for conversation details).  Of note, pt's TOX screen positive for Cocaine again this admission.  Current BMET in process.  IV Insulin drip initiated at 5am today.     --Will follow patient during hospitalization--  Ambrose Finland RN, MSN, CDE Diabetes Coordinator Inpatient Glycemic Control Team Team Pager: [phone] (8a-5p)

## 2019-01-17 NOTE — ED Triage Notes (Addendum)
Pt to triage via w/c with no distress noted; pt reports fluctuating glucose x 3 days accomp by muffled hearing and ringing in ears, increased urination and thirst and weakness; seen recently for same and was hospitalized with DKA

## 2019-01-17 NOTE — Plan of Care (Signed)
Patient progressing toward normalization of blood glucose. Patient is maintaining stable vital signs, on room air, voiding in urinal.

## 2019-01-17 NOTE — H&P (Addendum)
[place] [facility] [place] NAME: Spencer [name]    MR#:  [id number]  DATE OF BIRTH:  [birth year]  DATE OF ADMISSION:  01/17/2019  PRIMARY CARE PHYSICIAN: Jodi Marble, MD   REQUESTING/REFERRING PHYSICIAN: Merlyn Lot, MD  CHIEF COMPLAINT:   Chief Complaint  Patient presents with  . Hyperglycemia  Intractable nausea and vomiting  HISTORY OF PRESENT ILLNESS:  Spencer [name]  is a 58 y.o. male with a known history of diabetes mellitus, hypertension, ongoing EtOH, cocaine and tobacco abuse and brain aneurysm who presented to the emergency room with acute onset of intractable nausea and vomiting and elevated blood glucose levels.  He has not taken his insulin yesterday the last time was 2 days ago.  He admitted to polyuria and polydipsia.  He denied any bilious vomitus or hematemesis.  No diarrhea or melena or bright red bleeding per rectum.  He denied any abdominal pain.  No chest pain or dyspnea or palpitations.  No cough or wheezing.  Upon presentation to the emergency room, his blood pressure was 161/82 and respiratory rate of 22 with otherwise no vital signs.  Labs were remarkable for an ABG with pH 7.09 with bicarbonate of 7.3 and sodium 133 BUN of 38 and creatinine of 1.9 (up from 28 and 0.86 on 01/08/2019)  with a blood glucose of 590 and anion gap of 32.  Urinalysis had more than 500 glucose and 80 ketones with negative protein.  Urine drug screen was positive for cocaine.  Portable chest x-ray showed no acute cardiopulmonary disease.  EKG showed normal sinus rhythm with a rate of 97 with suspected left atrial enlargement and Q waves in V1 and V2.  The patient was given 2 and half liter bolus of IV normal saline as well as 10 units of IV insulin followed by IV insulin drip.  He will be admitted to stepdown unit for further management of his DKA  PAST MEDICAL HISTORY:   Past Medical History:  Diagnosis Date  . Brain aneurysm   . Diabetes  mellitus without complication ([place])   . Hypertension   . Substance abuse ([place])   Including cocaine, tobacco and alcohol PAST SURGICAL HISTORY:   Past Surgical History:  Procedure Laterality Date  . HERNIA REPAIR    . NECK SURGERY      SOCIAL HISTORY:   Social History   Tobacco Use  . Smoking status: Current Every Day Smoker    Packs/day: 0.50    Years: 34.00    Pack years: 17.00    Types: Cigarettes  . Smokeless tobacco: Never Used  Substance Use Topics  . Alcohol use: Yes    Alcohol/week: 1.0 standard drinks    Types: 1 Cans of beer per week    FAMILY HISTORY:   Family History  Problem Relation Age of Onset  . CAD Sister   . Hypertension Sister   . Healthy Mother   . Healthy Father     DRUG ALLERGIES:  No Known Allergies  REVIEW OF SYSTEMS:   ROS: As per history of present illness. All pertinent systems were reviewed above. Constitutional, HEENT, cardiovascular, respiratory, GI, GU, musculoskeletal, neuro, psychiatric, endocrine, integumentary and hematologic systems were reviewed and are otherwise negative/unremarkable except for positive findings mentioned above in the HPI.   MEDICATIONS AT HOME:   Prior to Admission medications   Medication Sig Start Date End Date Taking? Authorizing Provider  atorvastatin (LIPITOR) 10 MG tablet Take 1 tablet (10  mg total) by mouth daily. 01/08/19  Yes Mayo, Pete Pelt, MD  blood glucose meter kit and supplies Check blood sugar 4 times daily 01/08/19  Yes Mayo, Pete Pelt, MD  gabapentin (NEURONTIN) 300 MG capsule Take 2 capsules (600 mg total) by mouth 3 (three) times daily. 01/08/19  Yes Mayo, Pete Pelt, MD  insulin aspart (NOVOLOG) 100 UNIT/ML FlexPen Inject 5 Units into the skin 3 (three) times daily with meals for 30 days. 01/08/19 02/07/19 Yes Mayo, Pete Pelt, MD  Insulin Pen Needle 31G X 5 MM MISC Check blood sugars 4 times daily 01/08/19  Yes Mayo, Pete Pelt, MD  TRESIBA FLEXTOUCH 200 UNIT/ML SOPN Inject 20 Units into the skin  daily for 30 days. 01/08/19 02/07/19 Yes Mayo, Pete Pelt, MD  cyclobenzaprine (FLEXERIL) 10 MG tablet Take 1 tablet (10 mg total) by mouth 3 (three) times daily as needed. 02/13/18   Sable Feil, PA-C      VITAL SIGNS:  Blood pressure (!) 161/82, pulse 91, resp. rate (!) 22, height '6\' 1"'$  (1.854 m), weight 74.8 kg, SpO2 100 %.  PHYSICAL EXAMINATION:  Physical Exam  GENERAL:  58 y.o.-year-old patient lying in the bed, slightly somnolent but arousable with no acute distress.  EYES: Pupils equal, round, reactive to light and accommodation. No scleral icterus. Extraocular muscles intact.  HEENT: Head atraumatic, normocephalic. Oropharynx with slightly dry mucous membrane and tongue.  Nose clear NECK:  Supple, no jugular venous distention. No thyroid enlargement, no tenderness.  LUNGS: Normal breath sounds bilaterally, no wheezing, rales,rhonchi or crepitation. No use of accessory muscles of respiration.  CARDIOVASCULAR: Regular rate and rhythm, normal S1, S2 normal. No murmurs, rubs, or gallops.  ABDOMEN: Soft, nontender, nondistended. Bowel sounds present. No organomegaly or mass.  EXTREMITIES: No pedal edema, cyanosis, or clubbing.  NEUROLOGIC: Cranial nerves II through XII are intact. Muscle strength 5/5 in all extremities. Sensation intact. Gait not checked.  PSYCHIATRIC: The patient is alert and oriented x 3.  SKIN: No obvious rash, lesion, or ulcer.   LABORATORY PANEL:   CBC Recent Labs  Lab 01/17/19 0049  WBC 15.6*  HGB 15.5  HCT 46.9  PLT 401*   ------------------------------------------------------------------------------------------------------------------  Chemistries  Recent Labs  Lab 01/17/19 0049  NA 133*  K 4.8  CL 93*  CO2 8*  GLUCOSE 590*  BUN 38*  CREATININE 1.97*  CALCIUM 9.4  AST 15  ALT 18  ALKPHOS 102  BILITOT 1.6*   ------------------------------------------------------------------------------------------------------------------  Cardiac Enzymes  Recent Labs  Lab 01/17/19 0049  TROPONINI <0.03   ------------------------------------------------------------------------------------------------------------------  RADIOLOGY:  Dg Chest 2 View  Result Date: 01/17/2019 CLINICAL DATA:  Fluctuating glucose for 3 days. Difficulty hearing and reading any years. Increased urination, increased thirst, and weakness. History of hypertension and smoking. EXAM: CHEST - 2 VIEW COMPARISON:  10/25/2018 FINDINGS: Normal heart size and pulmonary vascularity. No focal airspace disease or consolidation in the lungs. No blunting of costophrenic angles. No pneumothorax. Mediastinal contours appear intact. Medical foreign bodies demonstrated over the posterior right chest. Postoperative changes in the cervical spine. IMPRESSION: No active cardiopulmonary disease. Electronically Signed   By: Lucienne Capers M.D.   On: 01/17/2019 01:53      IMPRESSION AND PLAN:   #1 DKA.  The patient will be admitted to a stepdown unit.  He will be placed on aggressive hydration with IV normal saline per DKA protocol as well as IV insulin drip.  We will follow serial BMPs.  One blood glucose is less than  250 he will be switched to D5 half-normal saline.  This is likely secondary to his intractable nausea and vomiting and subsequent dehydration and could be partly related to noncompliance.  2..  Intractable nausea and vomiting with subsequent dehydration and prerenal acute kidney injury.  The patient will be placed on hydration with IV normal saline as mentioned above.  BMP will be followed.  3.  Hypertensive urgency.  He will be placed on PRN IV labetalol and start him on p.o. Norvasc this is been apparently started during his last admission and he is likely not been compliant with it.  4.  Polysubstance abuse including tobacco, cocaine and alcohol.  He was counseled for cessation of all.  He received further counseling here.  5.  DVT prophylaxis.  Subcutaneous Lovenox  6.   GI prophylaxis.  We will place him on IV Protonix..                                                                                                                                                                                             All the records are reviewed and case discussed with ED provider.  I also discussed and signed out this case with the [facility] intensivist Dr. Jimmy Footman.   The plan of care was discussed in details with the patient. I answered all questions. The patient agreed to proceed with the above mentioned plan. Further management will depend upon hospital course.   CODE STATUS: Full code.  TOTAL TIME TAKING CARE OF THIS PATIENT: 55 minutes.    Christel Mormon M.D on 01/17/2019 at 4:22 AM  Between 7am to 6pm - Pager - [phone]  After 6pm go to [email or web address] - Proofreader  [facility]  Office  [phone]  CC: Primary care physician; Jodi Marble, MD   Note: This dictation was prepared with Dragon dictation along with smaller phrase technology. Any transcriptional errors that result from this process are unintentional.

## 2019-01-17 NOTE — Progress Notes (Signed)
Pt down from [facility] this late pm with dka. Was on insulin drip  In ccu. Pt  Is freq flyer.  Here not to long ago with same.  No distress.  R/a. Wants to sleep allthe time. Swallow pills whole well with water.

## 2019-01-17 NOTE — ED Provider Notes (Signed)
[facility] Emergency Department Provider Note    First MD Initiated Contact with Patient 01/17/19 [phone]     (approximate)  I have reviewed the triage vital signs and the nursing notes.   HISTORY  Chief Complaint Hyperglycemia    HPI Spencer [name] is a 58 y.o. male listed past medical history presents the ER for several days of nausea generalized malaise and elevated blood sugars.  Does have a history of DKA.  States has not been able to keep anything down and has been vomiting.  Is having chest pain and some shortness of breath.  No measured fevers.    Past Medical History:  Diagnosis Date   Brain aneurysm    Diabetes mellitus without complication ([place])    Hypertension    Substance abuse ([place])    Family History  Problem Relation Age of Onset   CAD Sister    Hypertension Sister    Healthy Mother    Healthy Father    Past Surgical History:  Procedure Laterality Date   HERNIA REPAIR     NECK SURGERY     Patient Active Problem List   Diagnosis Date Noted   Hypoglycemia 10/23/2018   Chronic low back pain 01/16/2017   Chronic neck pain 01/16/2017   Diabetic neuropathy ([place]) 01/16/2017   DM2 (diabetes mellitus, type 2) ([place]) 01/16/2017   History of cocaine abuse ([place]) 01/16/2017   Personal history of subdural hematoma 01/16/2017   Closed displaced fracture of body of left calcaneus with delayed healing 12/13/2016   Protein-calorie malnutrition, severe 11/08/2016   DKA, type 2 ([place]) 11/06/2016   Diabetic ketoacidosis ([place]) 04/03/2015   DKA (diabetic ketoacidoses) ([place]) 04/03/2015   Hypertension 04/03/2015   Depression 04/03/2015   Opiate dependence ([place]) 04/03/2015   Tobacco abuse 04/03/2015   Lumbosacral neuritis 07/19/2014   Pain of finger of right hand 07/19/2014   Type II or unspecified type diabetes mellitus without mention of complication, not stated as uncontrolled 07/19/2014   Knee pain 09/12/2013    Hernia of flank 09/20/2012   Epidermoid cyst of skin 09/20/2012   Chronic pain of both shoulders 03/27/2012      Prior to Admission medications   Medication Sig Start Date End Date Taking? Authorizing Provider  atorvastatin (LIPITOR) 10 MG tablet Take 1 tablet (10 mg total) by mouth daily. 01/08/19  Yes Mayo, Pete Pelt, MD  blood glucose meter kit and supplies Check blood sugar 4 times daily 01/08/19  Yes Mayo, Pete Pelt, MD  gabapentin (NEURONTIN) 300 MG capsule Take 2 capsules (600 mg total) by mouth 3 (three) times daily. 01/08/19  Yes Mayo, Pete Pelt, MD  insulin aspart (NOVOLOG) 100 UNIT/ML FlexPen Inject 5 Units into the skin 3 (three) times daily with meals for 30 days. 01/08/19 02/07/19 Yes Mayo, Pete Pelt, MD  Insulin Pen Needle 31G X 5 MM MISC Check blood sugars 4 times daily 01/08/19  Yes Mayo, Pete Pelt, MD  TRESIBA FLEXTOUCH 200 UNIT/ML SOPN Inject 20 Units into the skin daily for 30 days. 01/08/19 02/07/19 Yes Mayo, Pete Pelt, MD  cyclobenzaprine (FLEXERIL) 10 MG tablet Take 1 tablet (10 mg total) by mouth 3 (three) times daily as needed. 02/13/18   Sable Feil, PA-C    Allergies Patient has no known allergies.    Social History Social History   Tobacco Use   Smoking status: Current Every Day Smoker    Packs/day: 0.50    Years: 34.00    Pack  years: 17.00    Types: Cigarettes   Smokeless tobacco: Never Used  Substance Use Topics   Alcohol use: Yes    Alcohol/week: 1.0 standard drinks    Types: 1 Cans of beer per week   Drug use: Yes    Types: Cocaine    Review of Systems Patient denies headaches, rhinorrhea, blurry vision, numbness, shortness of breath, chest pain, edema, cough, abdominal pain, nausea, vomiting, diarrhea, dysuria, fevers, rashes or hallucinations unless otherwise stated above in HPI. ____________________________________________   PHYSICAL EXAM:  VITAL SIGNS: Vitals:   01/17/19 0300  BP: (!) 161/82  Pulse: 91  Resp: (!) 22  SpO2: 100%     Constitutional: Alert and oriented. Ill appearing Eyes: Conjunctivae are normal.  Head: Atraumatic. Nose: No congestion/rhinnorhea. Mouth/Throat: Mucous membranes are dry Neck: No stridor. Painless ROM.  Cardiovascular: Normal rate, regular rhythm. Grossly normal heart sounds.  Good peripheral circulation. Respiratory: Normal respiratory effort.  No retractions. Lungs CTAB. Gastrointestinal: Soft and nontender. No distention. No abdominal bruits. No CVA tenderness. Genitourinary:  Musculoskeletal: No lower extremity tenderness nor edema.  No joint effusions. Neurologic:  Normal speech and language. No gross focal neurologic deficits are appreciated. No facial droop Skin:  Skin is warm, dry and intact. No rash noted. Psychiatric: Mood and affect are normal. Speech and behavior are normal.  ____________________________________________   LABS (all labs ordered are listed, but only abnormal results are displayed)  Results for orders placed or performed during the hospital encounter of 01/17/19 (from the past 24 hour(s))  Glucose, capillary     Status: Abnormal   Collection Time: 01/17/19 12:29 AM  Result Value Ref Range   Glucose-Capillary 598 (HH) 70 - 99 mg/dL   Comment 1 Notify RN   CBC with Differential     Status: Abnormal   Collection Time: 01/17/19 12:49 AM  Result Value Ref Range   WBC 15.6 (H) 4.0 - 10.5 K/uL   RBC 4.95 4.22 - 5.81 MIL/uL   Hemoglobin 15.5 13.0 - 17.0 g/dL   HCT 46.9 39.0 - 52.0 %   MCV 94.7 80.0 - 100.0 fL   MCH 31.3 26.0 - 34.0 pg   MCHC 33.0 30.0 - 36.0 g/dL   RDW 12.4 11.5 - 15.5 %   Platelets 401 (H) 150 - 400 K/uL   nRBC 0.0 0.0 - 0.2 %   Neutrophils Relative % 89 %   Neutro Abs 13.9 (H) 1.7 - 7.7 K/uL   Lymphocytes Relative 7 %   Lymphs Abs 1.0 0.7 - 4.0 K/uL   Monocytes Relative 4 %   Monocytes Absolute 0.6 0.1 - 1.0 K/uL   Eosinophils Relative 0 %   Eosinophils Absolute 0.0 0.0 - 0.5 K/uL   Basophils Relative 0 %   Basophils  Absolute 0.0 0.0 - 0.1 K/uL   Immature Granulocytes 0 %   Abs Immature Granulocytes 0.06 0.00 - 0.07 K/uL  Comprehensive metabolic panel     Status: Abnormal   Collection Time: 01/17/19 12:49 AM  Result Value Ref Range   Sodium 133 (L) 135 - 145 mmol/L   Potassium 4.8 3.5 - 5.1 mmol/L   Chloride 93 (L) 98 - 111 mmol/L   CO2 8 (L) 22 - 32 mmol/L   Glucose, Bld 590 (HH) 70 - 99 mg/dL   BUN 38 (H) 6 - 20 mg/dL   Creatinine, Ser 1.97 (H) 0.61 - 1.24 mg/dL   Calcium 9.4 8.9 - 10.3 mg/dL   Total Protein 8.2 (H) 6.5 - 8.1  g/dL   Albumin 4.9 3.5 - 5.0 g/dL   AST 15 15 - 41 U/L   ALT 18 0 - 44 U/L   Alkaline Phosphatase 102 38 - 126 U/L   Total Bilirubin 1.6 (H) 0.3 - 1.2 mg/dL   GFR calc non Af Amer 36 (L) >60 mL/min   GFR calc Af Amer 42 (L) >60 mL/min   Anion gap 32 (H) 5 - 15  Troponin I - ONCE - STAT     Status: None   Collection Time: 01/17/19 12:49 AM  Result Value Ref Range   Troponin I <0.03 <0.03 ng/mL  Blood gas, venous     Status: Abnormal   Collection Time: 01/17/19 12:49 AM  Result Value Ref Range   pH, Ven 7.09 (LL) 7.250 - 7.430   pCO2, Ven 24 (L) 44.0 - 60.0 mmHg   pO2, Ven 35.0 32.0 - 45.0 mmHg   Bicarbonate 7.3 (L) 20.0 - 28.0 mmol/L   Acid-base deficit 21.0 (H) 0.0 - 2.0 mmol/L   O2 Saturation 42.8 %   Patient temperature 37.0    Collection site LINE    Sample type VENOUS   Urinalysis, Complete w Microscopic     Status: Abnormal   Collection Time: 01/17/19  2:02 AM  Result Value Ref Range   Color, Urine STRAW (A) YELLOW   APPearance CLEAR (A) CLEAR   Specific Gravity, Urine 1.018 1.005 - 1.030   pH 5.0 5.0 - 8.0   Glucose, UA >=500 (A) NEGATIVE mg/dL   Hgb urine dipstick SMALL (A) NEGATIVE   Bilirubin Urine NEGATIVE NEGATIVE   Ketones, ur 80 (A) NEGATIVE mg/dL   Protein, ur NEGATIVE NEGATIVE mg/dL   Nitrite NEGATIVE NEGATIVE   Leukocytes,Ua NEGATIVE NEGATIVE   RBC / HPF 0-5 0 - 5 RBC/hpf   WBC, UA NONE SEEN 0 - 5 WBC/hpf   Bacteria, UA NONE SEEN NONE  SEEN   Squamous Epithelial / LPF NONE SEEN 0 - 5   Hyaline Casts, UA PRESENT   Urine Drug Screen, Qualitative ([facility] only)     Status: Abnormal   Collection Time: 01/17/19  2:02 AM  Result Value Ref Range   Tricyclic, Ur Screen NONE DETECTED NONE DETECTED   Amphetamines, Ur Screen NONE DETECTED NONE DETECTED   MDMA (Ecstasy)Ur Screen NONE DETECTED NONE DETECTED   Cocaine Metabolite,Ur [place] POSITIVE (A) NONE DETECTED   Opiate, Ur Screen NONE DETECTED NONE DETECTED   Phencyclidine (PCP) Ur S NONE DETECTED NONE DETECTED   Cannabinoid 50 Ng, Ur  NONE DETECTED NONE DETECTED   Barbiturates, Ur Screen NONE DETECTED NONE DETECTED   Benzodiazepine, Ur Scrn NONE DETECTED NONE DETECTED   Methadone Scn, Ur NONE DETECTED NONE DETECTED  Glucose, capillary     Status: Abnormal   Collection Time: 01/17/19  3:53 AM  Result Value Ref Range   Glucose-Capillary 312 (H) 70 - 99 mg/dL   ____________________________________________  EKG My review and personal interpretation at Time: 00:27   Indication: dka  Rate: 95  Rhythm: sinus Axis: normal Other: normal intervals, no stemi ____________________________________________  RADIOLOGY  I personally reviewed all radiographic images ordered to evaluate for the above acute complaints and reviewed radiology reports and findings.  These findings were personally discussed with the patient.  Please see medical record for radiology report.  ____________________________________________   PROCEDURES  Procedure(s) performed:  .Critical Care Performed by: Merlyn Lot, MD Authorized by: Merlyn Lot, MD   Critical care provider statement:    Critical care time (  minutes):  35   Critical care time was exclusive of:  Separately billable procedures and treating other patients   Critical care was necessary to treat or prevent imminent or life-threatening deterioration of the following conditions:  Endocrine crisis and metabolic crisis   Critical care  was time spent personally by me on the following activities:  Development of treatment plan with patient or surrogate, discussions with consultants, evaluation of patient's response to treatment, examination of patient, obtaining history from patient or surrogate, ordering and performing treatments and interventions, ordering and review of laboratory studies, ordering and review of radiographic studies, pulse oximetry, re-evaluation of patient's condition and review of old charts      Critical Care performed: yes ____________________________________________   INITIAL IMPRESSION / ASSESSMENT AND PLAN / ED COURSE  Pertinent labs & imaging results that were available during my care of the patient were reviewed by me and considered in my medical decision making (see chart for details).   DDX: DKA, HHS, sepsis, dehydration, electrolyte abnormality  Spencer [name] is a 58 y.o. who presents to the ED with symptoms as described above.  Patient clinically appears ill will give IV fluids.  Very dehydrated appearing.  I have a high suspicion for DKA.     Does show evidence of DKA with high anion gap acute metabolic acidosis.  Started IV bolus of insulin as well as insulin drip.  Patient will require admission to the hospital for further medical management.  As part of my medical decision making, I reviewed the following data within the [place] notes reviewed and incorporated, Labs reviewed, notes from prior ED visits.   ____________________________________________   FINAL CLINICAL IMPRESSION(S) / ED DIAGNOSES  Final diagnoses:  Diabetic ketoacidosis without coma associated with type 1 diabetes mellitus (HCC)      NEW MEDICATIONS STARTED DURING THIS VISIT:  New Prescriptions   No medications on file     Note:  This document was prepared using Dragon voice recognition software and may include unintentional dictation errors.    Merlyn Lot, MD 01/17/19  [phone]

## 2019-01-17 NOTE — Progress Notes (Signed)
[employer] [facility] [facility] [place] [facility]   PATIENT NAME: Spencer [name]    MR#:  [id number]  DATE OF BIRTH:  August 30, 1961  SUBJECTIVE:  CHIEF COMPLAINT:   Chief Complaint  Patient presents with  . Hyperglycemia  Seen and evaluated today On IV insulin drip in the ICU Had low-grade fever Flu test sent  REVIEW OF SYSTEMS:    ROS  CONSTITUTIONAL: Had fever. Has fatigue, weakness. No weight gain, no weight loss.  EYES: No blurry or double vision.  ENT: No tinnitus. No postnasal drip. No redness of the oropharynx.  RESPIRATORY: No cough, no wheeze, no hemoptysis. No dyspnea.  CARDIOVASCULAR: No chest pain. No orthopnea. No palpitations. No syncope.  GASTROINTESTINAL: No nausea, no vomiting or diarrhea. No abdominal pain. No melena or hematochezia.  GENITOURINARY: No dysuria or hematuria.  ENDOCRINE: No polyuria or nocturia. No heat or cold intolerance.  HEMATOLOGY: No anemia. No bruising. No bleeding.  INTEGUMENTARY: No rashes. No lesions.  MUSCULOSKELETAL: No arthritis. No swelling. No gout.  NEUROLOGIC: No numbness, tingling, or ataxia. No seizure-type activity.  PSYCHIATRIC: No anxiety. No insomnia. No ADD.   DRUG ALLERGIES:  No Known Allergies  VITALS:  Blood pressure 136/81, pulse 71, temperature 99.5 F (37.5 C), temperature source Oral, resp. rate 17, height 6\' 1"  (1.854 m), weight 61.8 kg, SpO2 97 %.  PHYSICAL EXAMINATION:   Physical Exam  GENERAL:  58 y.o.-year-old patient lying in the bed with no acute distress.  EYES: Pupils equal, round, reactive to light and accommodation. No scleral icterus. Extraocular muscles intact.  HEENT: Head atraumatic, normocephalic. Oropharynx and nasopharynx clear.  NECK:  Supple, no jugular venous distention. No thyroid enlargement, no tenderness.  LUNGS: Normal breath sounds bilaterally, no wheezing, rales, rhonchi. No use of accessory muscles of respiration.  CARDIOVASCULAR: S1, S2 normal. No murmurs, rubs, or gallops.   ABDOMEN: Soft, nontender, nondistended. Bowel sounds present. No organomegaly or mass.  EXTREMITIES: No cyanosis, clubbing or edema b/l.    NEUROLOGIC: Cranial nerves II through XII are intact. No focal Motor or sensory deficits b/l.   PSYCHIATRIC: The patient is alert and oriented x 2.  SKIN: No obvious rash, lesion, or ulcer.   LABORATORY PANEL:   CBC Recent Labs  Lab 01/17/19 0725  WBC 14.6*  HGB 13.8  HCT 42.0  PLT 322   ------------------------------------------------------------------------------------------------------------------ Chemistries  Recent Labs  Lab 01/17/19 0049 01/17/19 0725  NA 133* 139  K 4.8 3.8  CL 93* 111  CO2 8* 10*  GLUCOSE 590* 249*  BUN 38* 28*  CREATININE 1.97* 1.20  CALCIUM 9.4 8.5*  AST 15  --   ALT 18  --   ALKPHOS 102  --   BILITOT 1.6*  --    ------------------------------------------------------------------------------------------------------------------  Cardiac Enzymes Recent Labs  Lab 01/17/19 0049  TROPONINI <0.03   ------------------------------------------------------------------------------------------------------------------  RADIOLOGY:  Dg Chest 2 View  Result Date: 01/17/2019 CLINICAL DATA:  Fluctuating glucose for 3 days. Difficulty hearing and reading any years. Increased urination, increased thirst, and weakness. History of hypertension and smoking. EXAM: CHEST - 2 VIEW COMPARISON:  10/25/2018 FINDINGS: Normal heart size and pulmonary vascularity. No focal airspace disease or consolidation in the lungs. No blunting of costophrenic angles. No pneumothorax. Mediastinal contours appear intact. Medical foreign bodies demonstrated over the posterior right chest. Postoperative changes in the cervical spine. IMPRESSION: No active cardiopulmonary disease. Electronically Signed   By: Burman Nieves M.D.   On: 01/17/2019 01:53     ASSESSMENT AND PLAN:  58 year old male patient with history of diabetes mellitus type 2,  hypertension, Etha abuse, cocaine and tobacco use, brain aneurysm currently in ICU for DKA on insulin drip.  -Diabetic ketoacidosis Patient blood sugars better controlled on insulin drip Wean insulin drip Once blood sugar stable transfer to medical floor  -Nausea and vomiting Antiemetic medications intravenously  -Dehydration IV fluids  -Tobacco abuse Tobacco cessation counseled to the patient for 6 minutes Nicotine patch offered Substance abuse counseling given  -Hypertension better controlled Continue Norvasc and PRN labetalol  -Transfer to medical floor once patient is stable  All the records are reviewed and case discussed with Care Management/Social Worker. Management plans discussed with the patient, family and they are in agreement.  CODE STATUS: Full code  DVT Prophylaxis: SCDs  TOTAL TIME TAKING CARE OF THIS PATIENT: 35 minutes.   POSSIBLE D/C IN 2 to 3 DAYS, DEPENDING ON CLINICAL CONDITION.  Ihor Austin M.D on 01/17/2019 at 11:17 AM  Between 7am to 6pm - Pager - [phone]  After 6pm go to [email or web address] - password [email or web address] [facility]  [facility] [place] [facility]  Office  [phone]  CC: Primary care physician; [name], MD  Note: This dictation was prepared with Dragon dictation along with smaller phrase technology. Any transcriptional errors that result from this process are unintentional.

## 2019-01-18 LAB — BASIC METABOLIC PANEL
Anion gap: 11 (ref 5–15)
BUN: 21 mg/dL — ABNORMAL HIGH (ref 6–20)
CO2: 16 mmol/L — AB (ref 22–32)
Calcium: 9 mg/dL (ref 8.9–10.3)
Chloride: 108 mmol/L (ref 98–111)
Creatinine, Ser: 0.97 mg/dL (ref 0.61–1.24)
GFR calc Af Amer: >60 mL/min (ref >60)
GFR calc non Af Amer: >60 mL/min (ref >60)
Glucose, Bld: 334 mg/dL — ABNORMAL HIGH (ref 70–99)
Potassium: 3.9 mmol/L (ref 3.5–5.1)
Sodium: 135 mmol/L (ref 135–145)

## 2019-01-18 LAB — GLUCOSE, CAPILLARY
Glucose-Capillary: 296 mg/dL — ABNORMAL HIGH (ref 70–99)
Glucose-Capillary: 319 mg/dL — ABNORMAL HIGH (ref 70–99)

## 2019-01-18 MED ORDER — AMLODIPINE BESYLATE 10 MG PO TABS: 10.0000 mg | ORAL_TABLET | Freq: Every day | ORAL | 0 refills | Status: DC

## 2019-01-18 MED ORDER — ENOXAPARIN SODIUM 40 MG/0.4ML ~~LOC~~ SOLN: 40.0000 mg | SUBCUTANEOUS | Status: DC

## 2019-01-18 MED ORDER — NICOTINE 21 MG/24HR TD PT24: 21.0000 mg | MEDICATED_PATCH | Freq: Every day | TRANSDERMAL | 0 refills | Status: DC

## 2019-01-18 NOTE — Discharge Summary (Signed)
[facility] [place] NAME: Spencer [name]    MR#:  [id number]  DATE OF BIRTH:  1961/09/08  DATE OF ADMISSION:  01/17/2019 ADMITTING PHYSICIAN: Arta Silence, MD  DATE OF DISCHARGE:   01/18/2019  PRIMARY CARE PHYSICIAN: Jodi Marble, MD    ADMISSION DIAGNOSIS:  Diabetic ketoacidosis without coma associated with type 1 diabetes mellitus ([place]) [E10.10]  DISCHARGE DIAGNOSIS:  Active Problems:   DKA (diabetic ketoacidoses) ([place])   SECONDARY DIAGNOSIS:   Past Medical History:  Diagnosis Date  . Brain aneurysm   . Diabetes mellitus without complication ([place])   . Hypertension   . Substance abuse [facility])     HOSPITAL COURSE:   58 year old male with history of diabetes and cocaine abuse who presented to the ER with intractable nausea and vomiting and found to have DKA.  1.  DKA is etiology of intractable nausea and vomiting: Nausea and vomiting have improved.  DKA has resolved.  Patient will continue with outpatient regimen.  He will need close follow-up with his primary care physician.  2.  Hypertensive urgency: Patient's blood pressure is improved Patient's urine toxicology was positive for cocaine and patient is not compliant with medications.  This was discussed with the patient.  3.Tobacco dependence: Patient is encouraged to quit smoking and willing to attempt to quit was assessed. Patient does not want to quit smoking at this time.Counseling was provided for 4 minutes.   4.  Hearing loss: Patient is complaining of hearing loss for past several months.  I have referred him to ENT and he will need to see an audiologist.  5.  Hyperlipidemia: Continue statin DISCHARGE CONDITIONS AND DIET:  Stable Diabetic diet  CONSULTS OBTAINED:    DRUG ALLERGIES:  No Known Allergies  DISCHARGE MEDICATIONS:   Allergies as of 01/18/2019   No Known Allergies     Medication List    TAKE these medications   amLODipine 10 MG  tablet Commonly known as:  NORVASC Take 1 tablet (10 mg total) by mouth daily. Start taking on:  January 19, 2019   atorvastatin 10 MG tablet Commonly known as:  LIPITOR Take 1 tablet (10 mg total) by mouth daily.   blood glucose meter kit and supplies Check blood sugar 4 times daily   cyclobenzaprine 10 MG tablet Commonly known as:  FLEXERIL Take 1 tablet (10 mg total) by mouth 3 (three) times daily as needed.   gabapentin 300 MG capsule Commonly known as:  NEURONTIN Take 2 capsules (600 mg total) by mouth 3 (three) times daily.   insulin aspart 100 UNIT/ML FlexPen Commonly known as:  NOVOLOG Inject 5 Units into the skin 3 (three) times daily with meals for 30 days.   Insulin Pen Needle 31G X 5 MM Misc Check blood sugars 4 times daily   nicotine 21 mg/24hr patch Commonly known as:  NICODERM CQ - dosed in mg/24 hours Place 1 patch (21 mg total) onto the skin daily.   Tyler Aas FlexTouch 200 UNIT/ML Sopn Generic drug:  Insulin Degludec Inject 20 Units into the skin daily for 30 days.         Today   CHIEF COMPLAINT:   Patient here with DKA Having hearing loss several months    VITAL SIGNS:  Blood pressure (!) 144/90, pulse 69, temperature 97.7 F (36.5 C), temperature source Oral, resp. rate 14, height 6' 1"  (1.854 m), weight 61.8 kg, SpO2 100 %.   REVIEW OF SYSTEMS:  Review of  Systems  Constitutional: Negative.  Negative for chills, fever and malaise/fatigue.  HENT: Negative.  Negative for ear discharge, ear pain, hearing loss, nosebleeds and sore throat.   Eyes: Negative.  Negative for blurred vision and pain.  Respiratory: Negative.  Negative for cough, hemoptysis, shortness of breath and wheezing.   Cardiovascular: Negative.  Negative for chest pain, palpitations and leg swelling.  Gastrointestinal: Negative.  Negative for abdominal pain, blood in stool, diarrhea, nausea and vomiting.  Genitourinary: Negative.  Negative for dysuria.  Musculoskeletal:  Negative.  Negative for back pain.  Skin: Negative.   Neurological: Negative for dizziness, tremors, speech change, focal weakness, seizures and headaches.  Endo/Heme/Allergies: Negative.  Does not bruise/bleed easily.  Psychiatric/Behavioral: Negative.  Negative for depression, hallucinations and suicidal ideas.     PHYSICAL EXAMINATION:  GENERAL:  58 y.o.-year-old patient lying in the bed with no acute distress.  NECK:  Supple, no jugular venous distention. No thyroid enlargement, no tenderness.  LUNGS: Normal breath sounds bilaterally, no wheezing, rales,rhonchi  No use of accessory muscles of respiration.  CARDIOVASCULAR: S1, S2 normal. No murmurs, rubs, or gallops.  ABDOMEN: Soft, non-tender, non-distended. Bowel sounds present. No organomegaly or mass.  EXTREMITIES: No pedal edema, cyanosis, or clubbing.  PSYCHIATRIC: The patient is alert and oriented x 3.  SKIN: No obvious rash, lesion, or ulcer.   DATA REVIEW:   CBC Recent Labs  Lab 01/17/19 0725  WBC 14.6*  HGB 13.8  HCT 42.0  PLT 322    Chemistries  Recent Labs  Lab 01/17/19 0049  01/18/19 0804  NA 133*   < > 135  K 4.8   < > 3.9  CL 93*   < > 108  CO2 8*   < > 16*  GLUCOSE 590*   < > 334*  BUN 38*   < > 21*  CREATININE 1.97*   < > 0.97  CALCIUM 9.4   < > 9.0  AST 15  --   --   ALT 18  --   --   ALKPHOS 102  --   --   BILITOT 1.6*  --   --    < > = values in this interval not displayed.    Cardiac Enzymes Recent Labs  Lab 01/17/19 0049  TROPONINI <0.03    Microbiology Results  @MICRORSLT48 @  RADIOLOGY:  Dg Chest 2 View  Result Date: 01/17/2019 CLINICAL DATA:  Fluctuating glucose for 3 days. Difficulty hearing and reading any years. Increased urination, increased thirst, and weakness. History of hypertension and smoking. EXAM: CHEST - 2 VIEW COMPARISON:  10/25/2018 FINDINGS: Normal heart size and pulmonary vascularity. No focal airspace disease or consolidation in the lungs. No blunting of  costophrenic angles. No pneumothorax. Mediastinal contours appear intact. Medical foreign bodies demonstrated over the posterior right chest. Postoperative changes in the cervical spine. IMPRESSION: No active cardiopulmonary disease. Electronically Signed   By: Lucienne Capers M.D.   On: 01/17/2019 01:53      Allergies as of 01/18/2019   No Known Allergies     Medication List    TAKE these medications   amLODipine 10 MG tablet Commonly known as:  NORVASC Take 1 tablet (10 mg total) by mouth daily. Start taking on:  January 19, 2019   atorvastatin 10 MG tablet Commonly known as:  LIPITOR Take 1 tablet (10 mg total) by mouth daily.   blood glucose meter kit and supplies Check blood sugar 4 times daily   cyclobenzaprine 10 MG tablet Commonly  known as:  FLEXERIL Take 1 tablet (10 mg total) by mouth 3 (three) times daily as needed.   gabapentin 300 MG capsule Commonly known as:  NEURONTIN Take 2 capsules (600 mg total) by mouth 3 (three) times daily.   insulin aspart 100 UNIT/ML FlexPen Commonly known as:  NOVOLOG Inject 5 Units into the skin 3 (three) times daily with meals for 30 days.   Insulin Pen Needle 31G X 5 MM Misc Check blood sugars 4 times daily   nicotine 21 mg/24hr patch Commonly known as:  NICODERM CQ - dosed in mg/24 hours Place 1 patch (21 mg total) onto the skin daily.   Tyler Aas FlexTouch 200 UNIT/ML Sopn Generic drug:  Insulin Degludec Inject 20 Units into the skin daily for 30 days.           Management plans discussed with the patient and he is in agreement. Stable for discharge   Patient should follow up with pcp  CODE STATUS:     Code Status Orders  (From admission, onward)         Start     Ordered   01/17/19 0413  Full code  Continuous     01/17/19 0412        Code Status History    Date Active Date Inactive Code Status Order ID Comments User Context   01/06/2019 1729 01/08/2019 1427 Full Code [id number]  Loletha Grayer, MD ED    10/25/2018 1356 10/27/2018 1345 Full Code [id number]  Dustin Flock, MD Inpatient   10/23/2018 1828 10/24/2018 1759 Full Code [id number]  Dustin Flock, MD Inpatient   01/15/2017 1417 01/18/2017 1716 Full Code [id number]  Gladstone Lighter, MD Inpatient   11/06/2016 0319 11/09/2016 1508 Full Code [id number]  Vickii Chafe Corine Shelter, MD Inpatient   11/06/2016 0229 11/06/2016 0319 Full Code [id number]  de Flo Shanks, MD Inpatient   04/04/2015 0008 04/06/2015 1418 Full Code [id number]  Phillips Grout, MD Inpatient      TOTAL TIME TAKING CARE OF THIS PATIENT: 38 minutes.    Note: This dictation was prepared with Dragon dictation along with smaller phrase technology. Any transcriptional errors that result from this process are unintentional.  Marris Frontera M.D on 01/18/2019 at 12:13 PM  Between 7am to 6pm - Pager - [phone] After 6pm go to [email or web address] - password [email or web address]  [facility]  Office  [phone]  CC: Primary care physician; Jodi Marble, MD

## 2019-01-18 NOTE — Progress Notes (Signed)
Pt d/c to home via personal vehicle. IVs removed intact. VSS. Education completed. Belongings sent with pt.

## 2019-01-18 NOTE — Plan of Care (Signed)
  Problem: Education: Goal: Ability to describe self-care measures that may prevent or decrease complications (Diabetes Survival Skills Education) will improve Outcome: Progressing Goal: Individualized Educational Video(s) Outcome: Progressing   Problem: Cardiac: Goal: Ability to maintain an adequate cardiac output will improve Outcome: Progressing   Problem: Health Behavior/Discharge Planning: Goal: Ability to identify and utilize available resources and services will improve Outcome: Progressing Goal: Ability to manage health-related needs will improve Outcome: Progressing   Problem: Fluid Volume: Goal: Ability to achieve a balanced intake and output will improve Outcome: Progressing   Problem: Respiratory: Goal: Will regain and/or maintain adequate ventilation Outcome: Progressing   Problem: Urinary Elimination: Goal: Ability to achieve and maintain adequate renal perfusion and functioning will improve Outcome: Progressing   Problem: Education: Goal: Knowledge of General Education information will improve Description Including pain rating scale, medication(s)/side effects and non-pharmacologic comfort measures Outcome: Progressing   Problem: Health Behavior/Discharge Planning: Goal: Ability to manage health-related needs will improve Outcome: Progressing   Problem: Clinical Measurements: Goal: Ability to maintain clinical measurements within normal limits will improve Outcome: Progressing Goal: Will remain free from infection Outcome: Progressing Goal: Diagnostic test results will improve Outcome: Progressing Goal: Respiratory complications will improve Outcome: Progressing Goal: Cardiovascular complication will be avoided Outcome: Progressing   Problem: Activity: Goal: Risk for activity intolerance will decrease Outcome: Progressing   Problem: Nutrition: Goal: Adequate nutrition will be maintained Outcome: Progressing   Problem: Coping: Goal: Level of  anxiety will decrease Outcome: Progressing   Problem: Elimination: Goal: Will not experience complications related to bowel motility Outcome: Progressing Goal: Will not experience complications related to urinary retention Outcome: Progressing   Problem: Pain Managment: Goal: General experience of comfort will improve Outcome: Progressing   Problem: Safety: Goal: Ability to remain free from injury will improve Outcome: Progressing

## 2019-02-07 ENCOUNTER — Inpatient Hospital Stay
Admission: EM | Admit: 2019-02-07 | Discharge: 2019-02-13 | DRG: 637 | Disposition: A | Discharge status: Home / Self Care | Payer: Medicaid Other | Attending: Internal Medicine | Admitting: Internal Medicine

## 2019-02-07 ENCOUNTER — Other Ambulatory Visit: Payer: Self-pay

## 2019-02-07 DIAGNOSIS — I10 Essential (primary) hypertension: Secondary | ICD-10-CM | POA: Diagnosis present

## 2019-02-07 DIAGNOSIS — D72829 Elevated white blood cell count, unspecified: Secondary | ICD-10-CM | POA: Diagnosis present

## 2019-02-07 DIAGNOSIS — N179 Acute kidney failure, unspecified: Secondary | ICD-10-CM | POA: Diagnosis present

## 2019-02-07 DIAGNOSIS — G9341 Metabolic encephalopathy: Secondary | ICD-10-CM | POA: Diagnosis present

## 2019-02-07 DIAGNOSIS — Z79899 Other long term (current) drug therapy: Secondary | ICD-10-CM

## 2019-02-07 DIAGNOSIS — Z681 Body mass index (BMI) 19 or less, adult: Secondary | ICD-10-CM | POA: Diagnosis not present

## 2019-02-07 DIAGNOSIS — E876 Hypokalemia: Secondary | ICD-10-CM | POA: Diagnosis not present

## 2019-02-07 DIAGNOSIS — R05 Cough: Secondary | ICD-10-CM

## 2019-02-07 DIAGNOSIS — E870 Hyperosmolality and hypernatremia: Secondary | ICD-10-CM | POA: Diagnosis present

## 2019-02-07 DIAGNOSIS — E1010 Type 1 diabetes mellitus with ketoacidosis without coma: Principal | ICD-10-CM | POA: Diagnosis present

## 2019-02-07 DIAGNOSIS — R739 Hyperglycemia, unspecified: Secondary | ICD-10-CM | POA: Diagnosis not present

## 2019-02-07 DIAGNOSIS — E875 Hyperkalemia: Secondary | ICD-10-CM

## 2019-02-07 DIAGNOSIS — Z794 Long term (current) use of insulin: Secondary | ICD-10-CM

## 2019-02-07 DIAGNOSIS — E860 Dehydration: Secondary | ICD-10-CM | POA: Diagnosis present

## 2019-02-07 DIAGNOSIS — E10649 Type 1 diabetes mellitus with hypoglycemia without coma: Secondary | ICD-10-CM | POA: Diagnosis not present

## 2019-02-07 DIAGNOSIS — E1110 Type 2 diabetes mellitus with ketoacidosis without coma: Secondary | ICD-10-CM | POA: Diagnosis present

## 2019-02-07 DIAGNOSIS — F17210 Nicotine dependence, cigarettes, uncomplicated: Secondary | ICD-10-CM | POA: Diagnosis present

## 2019-02-07 DIAGNOSIS — R410 Disorientation, unspecified: Secondary | ICD-10-CM

## 2019-02-07 DIAGNOSIS — E43 Unspecified severe protein-calorie malnutrition: Secondary | ICD-10-CM | POA: Diagnosis present

## 2019-02-07 DIAGNOSIS — R059 Cough, unspecified: Secondary | ICD-10-CM

## 2019-02-07 LAB — CBC WITH DIFFERENTIAL/PLATELET
Abs Immature Granulocytes: 0.11 10*3/uL — ABNORMAL HIGH (ref 0.00–0.07)
Basophils Absolute: 0 10*3/uL (ref 0.0–0.1)
Basophils Relative: 0 %
Eosinophils Absolute: 0 10*3/uL (ref 0.0–0.5)
Eosinophils Relative: 0 %
HCT: 50.1 % (ref 39.0–52.0)
Hemoglobin: 14.7 g/dL (ref 13.0–17.0)
Immature Granulocytes: 1 %
Lymphocytes Relative: 4 %
Lymphs Abs: 0.7 10*3/uL (ref 0.7–4.0)
MCH: 31.3 pg (ref 26.0–34.0)
MCHC: 29.3 g/dL — ABNORMAL LOW (ref 30.0–36.0)
MCV: 106.6 fL — ABNORMAL HIGH (ref 80.0–100.0)
Monocytes Absolute: 0.7 10*3/uL (ref 0.1–1.0)
Monocytes Relative: 4 %
Neutro Abs: 15.9 10*3/uL — ABNORMAL HIGH (ref 1.7–7.7)
Neutrophils Relative %: 91 %
Platelets: 307 10*3/uL (ref 150–400)
RBC: 4.7 MIL/uL (ref 4.22–5.81)
RDW: 13.8 % (ref 11.5–15.5)
WBC: 17.5 10*3/uL — ABNORMAL HIGH (ref 4.0–10.5)
nRBC: 0 % (ref 0.0–0.2)

## 2019-02-07 LAB — ETHANOL: Alcohol, Ethyl (B): <10 mg/dL (ref <10)

## 2019-02-07 LAB — SALICYLATE LEVEL: Salicylate Lvl: <7 mg/dL (ref 2.8–30.0)

## 2019-02-07 LAB — BLOOD GAS, VENOUS
Acid-base deficit: 21.5 mmol/L — ABNORMAL HIGH (ref 0.0–2.0)
Bicarbonate: 7.7 mmol/L — ABNORMAL LOW (ref 20.0–28.0)
O2 Saturation: 88.5 %
Patient temperature: 37
pCO2, Ven: 28 mmHg — ABNORMAL LOW (ref 44.0–60.0)
pH, Ven: 7.05 — CL (ref 7.250–7.430)
pO2, Ven: 80 mmHg — ABNORMAL HIGH (ref 32.0–45.0)

## 2019-02-07 LAB — COMPREHENSIVE METABOLIC PANEL
ALT: 22 U/L (ref 0–44)
AST: 16 U/L (ref 15–41)
Albumin: 4.5 g/dL (ref 3.5–5.0)
Alkaline Phosphatase: 115 U/L (ref 38–126)
BUN: 69 mg/dL — ABNORMAL HIGH (ref 6–20)
CO2: <7 mmol/L — ABNORMAL LOW (ref 22–32)
Calcium: 9 mg/dL (ref 8.9–10.3)
Chloride: 79 mmol/L — ABNORMAL LOW (ref 98–111)
Creatinine, Ser: 3.84 mg/dL — ABNORMAL HIGH (ref 0.61–1.24)
GFR calc Af Amer: 19 mL/min — ABNORMAL LOW (ref >60)
GFR calc non Af Amer: 16 mL/min — ABNORMAL LOW (ref >60)
Glucose, Bld: 1533 mg/dL (ref 70–99)
Potassium: 6.6 mmol/L (ref 3.5–5.1)
Sodium: 128 mmol/L — ABNORMAL LOW (ref 135–145)
Total Bilirubin: 2.2 mg/dL — ABNORMAL HIGH (ref 0.3–1.2)
Total Protein: 7.3 g/dL (ref 6.5–8.1)

## 2019-02-07 LAB — GLUCOSE, CAPILLARY: Glucose-Capillary: >600 mg/dL (ref 70–99)

## 2019-02-07 LAB — ACETAMINOPHEN LEVEL: Acetaminophen (Tylenol), Serum: <10 ug/mL — ABNORMAL LOW (ref 10–30)

## 2019-02-07 LAB — LIPASE, BLOOD: Lipase: 48 U/L (ref 11–51)

## 2019-02-07 MED ORDER — INSULIN REGULAR(HUMAN) IN NACL 100-0.9 UT/100ML-% IV SOLN
INTRAVENOUS | Status: DC
  Administered 2019-02-07: 5.4 [IU]/h via INTRAVENOUS
  Filled 2019-02-07: qty 100

## 2019-02-07 MED ORDER — SODIUM CHLORIDE 0.9 % IV BOLUS
1000.0000 mL | Freq: Once | INTRAVENOUS | Status: AC
  Administered 2019-02-07: 1000 mL via INTRAVENOUS

## 2019-02-07 MED ORDER — SODIUM BICARBONATE 8.4 % IV SOLN
50.0000 meq | Freq: Once | INTRAVENOUS | Status: AC
  Administered 2019-02-08: 02:00:00 50 meq via INTRAVENOUS
  Filled 2019-02-07: qty 50

## 2019-02-07 MED ORDER — SODIUM CHLORIDE 0.9 % IV BOLUS: 1000.0000 mL | Freq: Once | INTRAVENOUS | Status: AC

## 2019-02-07 MED ORDER — POTASSIUM CHLORIDE 10 MEQ/100ML IV SOLN
10.0000 meq | INTRAVENOUS | Status: DC
  Filled 2019-02-07 (×2): qty 100

## 2019-02-07 MED ORDER — SODIUM CHLORIDE 0.9 % IV SOLN: INTRAVENOUS | Status: DC

## 2019-02-07 MED ORDER — CALCIUM GLUCONATE 10 % IV SOLN
2.0000 g | Freq: Once | INTRAVENOUS | Status: DC
  Filled 2019-02-07: qty 20

## 2019-02-07 NOTE — ED Triage Notes (Signed)
Pt from home with ams and hyperglycemia. Pt is alert to self, place, date, but unsure of situation. Per ems family called for hyperglycemia. Pt with "high" blood sugar on our CBG. Possible ETOH ingestion today.

## 2019-02-07 NOTE — ED Notes (Signed)
Critical blood gas results pH 7.05, bicarb 7.7 called from RT. Dr. Scotty Court notified, no new verbal orders received.

## 2019-02-07 NOTE — ED Provider Notes (Addendum)
[facility] Emergency Department Provider Note  ____________________________________________  Time seen: Approximately 10:54 PM  I have reviewed the triage vital signs and the nursing notes.   HISTORY  Chief Complaint Altered Mental Status and Hyperglycemia    Level 5 Caveat: Portions of the History and Physical including HPI and review of systems are unable to be completely obtained due to patient being a poor historian   HPI KIING DEAKIN is a 58 y.o. male with a history of diabetes hypertension and substance abuse who comes to the ED today due to altered mental status/confusion and hyperglycemia.  At home glucometer was reading high.  Family was also concerned that the patient may have been drinking today.  When asked about symptoms, patient says yes to everything, but denies that any of the symptoms are acute.  He is unable to describe in what way he is acutely ill today.     Past Medical History:  Diagnosis Date  . Brain aneurysm   . Diabetes mellitus without complication ([place])   . Hypertension   . Substance abuse [facility])      Patient Active Problem List   Diagnosis Date Noted  . Hypoglycemia 10/23/2018  . Chronic low back pain 01/16/2017  . Chronic neck pain 01/16/2017  . Diabetic neuropathy ([place]) 01/16/2017  . DM2 (diabetes mellitus, type 2) ([place]) 01/16/2017  . History of cocaine abuse ([place]) 01/16/2017  . Personal history of subdural hematoma 01/16/2017  . Closed displaced fracture of body of left calcaneus with delayed healing 12/13/2016  . Protein-calorie malnutrition, severe 11/08/2016  . DKA, type 2 ([place]) 11/06/2016  . Diabetic ketoacidosis ([place]) 04/03/2015  . DKA (diabetic ketoacidoses) ([place]) 04/03/2015  . Hypertension 04/03/2015  . Depression 04/03/2015  . Opiate dependence ([place]) 04/03/2015  . Tobacco abuse 04/03/2015  . Lumbosacral neuritis 07/19/2014  . Pain of finger of right hand 07/19/2014  . Type II or unspecified type  diabetes mellitus without mention of complication, not stated as uncontrolled 07/19/2014  . Knee pain 09/12/2013  . Hernia of flank 09/20/2012  . Epidermoid cyst of skin 09/20/2012  . Chronic pain of both shoulders 03/27/2012     Past Surgical History:  Procedure Laterality Date  . HERNIA REPAIR    . NECK SURGERY       Prior to Admission medications   Medication Sig Start Date End Date Taking? Authorizing Provider  amLODipine (NORVASC) 10 MG tablet Take 1 tablet (10 mg total) by mouth daily. 01/19/19   Bettey Costa, MD  atorvastatin (LIPITOR) 10 MG tablet Take 1 tablet (10 mg total) by mouth daily. 01/08/19   Mayo, Pete Pelt, MD  blood glucose meter kit and supplies Check blood sugar 4 times daily 01/08/19   Mayo, Pete Pelt, MD  cyclobenzaprine (FLEXERIL) 10 MG tablet Take 1 tablet (10 mg total) by mouth 3 (three) times daily as needed. 02/13/18   Sable Feil, PA-C  gabapentin (NEURONTIN) 300 MG capsule Take 2 capsules (600 mg total) by mouth 3 (three) times daily. 01/08/19   Mayo, Pete Pelt, MD  insulin aspart (NOVOLOG) 100 UNIT/ML FlexPen Inject 5 Units into the skin 3 (three) times daily with meals for 30 days. 01/08/19 02/07/19  Mayo, Pete Pelt, MD  Insulin Pen Needle 31G X 5 MM MISC Check blood sugars 4 times daily 01/08/19   Mayo, Pete Pelt, MD  nicotine (NICODERM CQ - DOSED IN MG/24 HOURS) 21 mg/24hr patch Place 1 patch (21 mg total) onto the skin daily. 01/18/19  Bettey Costa, MD  TRESIBA FLEXTOUCH 200 UNIT/ML SOPN Inject 20 Units into the skin daily for 30 days. 01/08/19 02/07/19  Mayo, Pete Pelt, MD     Allergies Patient has no known allergies.   Family History  Problem Relation Age of Onset  . CAD Sister   . Hypertension Sister   . Healthy Mother   . Healthy Father     Social History Social History   Tobacco Use  . Smoking status: Current Every Day Smoker    Packs/day: 0.50    Years: 34.00    Pack years: 17.00    Types: Cigarettes  . Smokeless tobacco: Never Used   Substance Use Topics  . Alcohol use: Yes    Alcohol/week: 1.0 standard drinks    Types: 1 Cans of beer per week  . Drug use: Yes    Types: Cocaine    Review of Systems Level 5 Caveat: Portions of the History and Physical including HPI and review of systems are unable to be completely obtained due to patient being a poor historian   Constitutional:   No known fever.  ENT:   No rhinorrhea. Cardiovascular:   No chest pain or syncope. Respiratory:   No dyspnea or cough. Gastrointestinal:   Chronic abdominal pain without vomiting and diarrhea.  Musculoskeletal:   Negative for focal pain or swelling ____________________________________________   PHYSICAL EXAM:  VITAL SIGNS: ED Triage Vitals  Enc Vitals Group     BP 02/07/19 2205 (!) 149/93     Pulse Rate 02/07/19 2205 98     Resp 02/07/19 2205 20     Temp 02/07/19 2209 (!) 96.2 F (35.7 C)     Temp Source 02/07/19 2205 Axillary     SpO2 02/07/19 2205 96 %     Weight 02/07/19 2208 132 lb 4.4 oz (60 kg)     Height 02/07/19 2208 6' (1.829 m)     Head Circumference --      Peak Flow --      Pain Score --      Pain Loc --      Pain Edu? --      Excl. in [place]? --     Vital signs reviewed, nursing assessments reviewed.   Constitutional:   Alert and oriented to person and place.  Ill-appearing. Eyes:   Conjunctivae are normal. EOMI. PERRL. ENT      Head:   Normocephalic and atraumatic.      Nose:   No congestion/rhinnorhea.       Mouth/Throat:   Dry mucous membranes, no pharyngeal erythema. No peritonsillar mass.       Neck:   No meningismus. Full ROM. Hematological/Lymphatic/Immunilogical:   No cervical lymphadenopathy. Cardiovascular:   RRR. Symmetric bilateral radial and DP pulses.  No murmurs. Cap refill less than 2 seconds. Respiratory:   Normal respiratory effort without tachypnea/retractions. Breath sounds are clear and equal bilaterally. No wheezes/rales/rhonchi. Gastrointestinal:   Soft and nontender. Non distended.  There is no CVA tenderness.  No rebound, rigidity, or guarding.  Musculoskeletal:   Normal range of motion in all extremities. No joint effusions.  No lower extremity tenderness.  No edema. Neurologic:   Normal speech and language.  Motor grossly intact. No acute focal neurologic deficits are appreciated.  Skin:    Skin is warm, dry and intact. No rash noted.  No petechiae, purpura, or bullae.  ____________________________________________    LABS (pertinent positives/negatives) (all labs ordered are listed, but only abnormal results are displayed)  Labs Reviewed  CBC WITH DIFFERENTIAL/PLATELET - Abnormal; Notable for the following components:      Result Value   WBC 17.5 (*)    MCV 106.6 (*)    MCHC 29.3 (*)    Neutro Abs 15.9 (*)    Abs Immature Granulocytes 0.11 (*)    All other components within normal limits  BLOOD GAS, VENOUS - Abnormal; Notable for the following components:   pH, Ven 7.05 (*)    pCO2, Ven 28 (*)    pO2, Ven 80.0 (*)    Bicarbonate 7.7 (*)    Acid-base deficit 21.5 (*)    All other components within normal limits  GLUCOSE, CAPILLARY - Abnormal; Notable for the following components:   Glucose-Capillary >600 (*)    All other components within normal limits  ETHANOL  COMPREHENSIVE METABOLIC PANEL  LIPASE, BLOOD  ACETAMINOPHEN LEVEL  SALICYLATE LEVEL  URINALYSIS, COMPLETE (UACMP) WITH MICROSCOPIC  URINE DRUG SCREEN, QUALITATIVE ([facility] ONLY)   ____________________________________________   EKG    ____________________________________________    RADIOLOGY  No results found.  ____________________________________________   PROCEDURES .Critical Care Performed by: Carrie Mew, MD Authorized by: Carrie Mew, MD   Critical care provider statement:    Critical care time (minutes):  35   Critical care time was exclusive of:  Separately billable procedures and treating other patients   Critical care was necessary to treat or prevent  imminent or life-threatening deterioration of the following conditions:  Dehydration and metabolic crisis   Critical care was time spent personally by me on the following activities:  Development of treatment plan with patient or surrogate, discussions with consultants, evaluation of patient's response to treatment, examination of patient, obtaining history from patient or surrogate, ordering and performing treatments and interventions, ordering and review of laboratory studies, ordering and review of radiographic studies, pulse oximetry, re-evaluation of patient's condition and review of old charts    ____________________________________________    CLINICAL IMPRESSION / [place] / ED COURSE  Pertinent labs & imaging results that were available during my care of the patient were reviewed by me and considered in my medical decision making (see chart for details).   NEGAN GRUDZIEN was evaluated in Emergency Department on 02/07/2019 for the symptoms described in the history of present illness. He was evaluated in the context of the global COVID-19 pandemic, which necessitated consideration that the patient might be at risk for infection with the SARS-CoV-2 virus that causes COVID-19. Institutional protocols and algorithms that pertain to the evaluation of patients at risk for COVID-19 are in a state of rapid change based on information released by regulatory bodies including the CDC and federal and state organizations. These policies and algorithms were followed during the patient's care in the ED.   Patient presents with pronounced hyperglycemia.  VBG shows a pH of 7.05.  White blood cell count elevated to 19 which I think is demargination.  He has no acute symptoms to suggest acute infectious process or intra-abdominal pathology.  Ordered 2 L of saline for hydration, insulin drip.  Plan to admit.   ----------------------------------------- 11:24 PM on  02/07/2019 -----------------------------------------  Chemistry resulted, glucose of 1500.  Creatinine greater than 3.  Potassium of 6.2.  No further potassium infusion.  We will give calcium gluconate and bicarb.  Insulin drip will also help lower potassium as well hydration.     ____________________________________________   FINAL CLINICAL IMPRESSION(S) / ED DIAGNOSES    Final diagnoses:  Confusion  Diabetic ketoacidosis  without coma associated with type 2 diabetes mellitus [facility])     ED Discharge Orders    None      Portions of this note were generated with dragon dictation software. Dictation errors may occur despite best attempts at proofreading.   Carrie Mew, MD 02/07/19 2258    Carrie Mew, MD 02/07/19 [id number]

## 2019-02-07 NOTE — ED Notes (Signed)
Provider made aware of lab result K+ = 6.2

## 2019-02-08 ENCOUNTER — Inpatient Hospital Stay: Payer: Medicaid Other

## 2019-02-08 LAB — CBC WITH DIFFERENTIAL/PLATELET
Abs Immature Granulocytes: 0.04 10*3/uL (ref 0.00–0.07)
Basophils Absolute: 0 10*3/uL (ref 0.0–0.1)
Basophils Relative: 0 %
Eosinophils Absolute: 0 10*3/uL (ref 0.0–0.5)
Eosinophils Relative: 0 %
HCT: 42.9 % (ref 39.0–52.0)
Hemoglobin: 14.9 g/dL (ref 13.0–17.0)
Immature Granulocytes: 0 %
Lymphocytes Relative: 9 %
Lymphs Abs: 1.1 10*3/uL (ref 0.7–4.0)
MCH: 31 pg (ref 26.0–34.0)
MCHC: 34.7 g/dL (ref 30.0–36.0)
MCV: 89.4 fL (ref 80.0–100.0)
Monocytes Absolute: 0.8 10*3/uL (ref 0.1–1.0)
Monocytes Relative: 6 %
Neutro Abs: 11.2 10*3/uL — ABNORMAL HIGH (ref 1.7–7.7)
Neutrophils Relative %: 85 %
Platelets: 243 10*3/uL (ref 150–400)
RBC: 4.8 MIL/uL (ref 4.22–5.81)
RDW: 13.2 % (ref 11.5–15.5)
WBC: 13.2 10*3/uL — ABNORMAL HIGH (ref 4.0–10.5)
nRBC: 0 % (ref 0.0–0.2)

## 2019-02-08 LAB — BASIC METABOLIC PANEL
Anion gap: 25 — ABNORMAL HIGH (ref 5–15)
Anion gap: 17 — ABNORMAL HIGH (ref 5–15)
Anion gap: 8 (ref 5–15)
Anion gap: 9 (ref 5–15)
BUN: 63 mg/dL — ABNORMAL HIGH (ref 6–20)
BUN: 58 mg/dL — ABNORMAL HIGH (ref 6–20)
BUN: 53 mg/dL — ABNORMAL HIGH (ref 6–20)
BUN: 56 mg/dL — ABNORMAL HIGH (ref 6–20)
CO2: 17 mmol/L — ABNORMAL LOW (ref 22–32)
CO2: 21 mmol/L — ABNORMAL LOW (ref 22–32)
CO2: 29 mmol/L (ref 22–32)
CO2: 26 mmol/L (ref 22–32)
Calcium: 9 mg/dL (ref 8.9–10.3)
Calcium: 9.2 mg/dL (ref 8.9–10.3)
Calcium: 9.5 mg/dL (ref 8.9–10.3)
Calcium: 8.9 mg/dL (ref 8.9–10.3)
Chloride: 100 mmol/L (ref 98–111)
Chloride: 107 mmol/L (ref 98–111)
Chloride: 115 mmol/L — ABNORMAL HIGH (ref 98–111)
Chloride: 115 mmol/L — ABNORMAL HIGH (ref 98–111)
Creatinine, Ser: 3.5 mg/dL — ABNORMAL HIGH (ref 0.61–1.24)
Creatinine, Ser: 3 mg/dL — ABNORMAL HIGH (ref 0.61–1.24)
Creatinine, Ser: 1.97 mg/dL — ABNORMAL HIGH (ref 0.61–1.24)
Creatinine, Ser: 1.78 mg/dL — ABNORMAL HIGH (ref 0.61–1.24)
GFR calc Af Amer: 21 mL/min — ABNORMAL LOW (ref >60)
GFR calc Af Amer: 25 mL/min — ABNORMAL LOW (ref >60)
GFR calc Af Amer: 42 mL/min — ABNORMAL LOW (ref >60)
GFR calc Af Amer: 47 mL/min — ABNORMAL LOW (ref >60)
GFR calc non Af Amer: 18 mL/min — ABNORMAL LOW (ref >60)
GFR calc non Af Amer: 22 mL/min — ABNORMAL LOW (ref >60)
GFR calc non Af Amer: 36 mL/min — ABNORMAL LOW (ref >60)
GFR calc non Af Amer: 41 mL/min — ABNORMAL LOW (ref >60)
Glucose, Bld: 991 mg/dL (ref 70–99)
Glucose, Bld: 704 mg/dL (ref 70–99)
Glucose, Bld: 148 mg/dL — ABNORMAL HIGH (ref 70–99)
Glucose, Bld: 133 mg/dL — ABNORMAL HIGH (ref 70–99)
Potassium: 4 mmol/L (ref 3.5–5.1)
Potassium: 3.3 mmol/L — ABNORMAL LOW (ref 3.5–5.1)
Potassium: 3.3 mmol/L — ABNORMAL LOW (ref 3.5–5.1)
Potassium: 3.9 mmol/L (ref 3.5–5.1)
Sodium: 142 mmol/L (ref 135–145)
Sodium: 145 mmol/L (ref 135–145)
Sodium: 152 mmol/L — ABNORMAL HIGH (ref 135–145)
Sodium: 150 mmol/L — ABNORMAL HIGH (ref 135–145)

## 2019-02-08 LAB — URINALYSIS, COMPLETE (UACMP) WITH MICROSCOPIC
Bacteria, UA: NONE SEEN
Bilirubin Urine: NEGATIVE
Glucose, UA: >=500 mg/dL — AB
Ketones, ur: 80 mg/dL — AB
Leukocytes,Ua: NEGATIVE
Nitrite: NEGATIVE
Protein, ur: NEGATIVE mg/dL
Specific Gravity, Urine: 1.023 (ref 1.005–1.030)
pH: 5 (ref 5.0–8.0)

## 2019-02-08 LAB — GLUCOSE, CAPILLARY
Glucose-Capillary: 115 mg/dL — ABNORMAL HIGH (ref 70–99)
Glucose-Capillary: 116 mg/dL — ABNORMAL HIGH (ref 70–99)
Glucose-Capillary: 117 mg/dL — ABNORMAL HIGH (ref 70–99)
Glucose-Capillary: 118 mg/dL — ABNORMAL HIGH (ref 70–99)
Glucose-Capillary: 126 mg/dL — ABNORMAL HIGH (ref 70–99)
Glucose-Capillary: 151 mg/dL — ABNORMAL HIGH (ref 70–99)
Glucose-Capillary: 152 mg/dL — ABNORMAL HIGH (ref 70–99)
Glucose-Capillary: 169 mg/dL — ABNORMAL HIGH (ref 70–99)
Glucose-Capillary: 190 mg/dL — ABNORMAL HIGH (ref 70–99)
Glucose-Capillary: 201 mg/dL — ABNORMAL HIGH (ref 70–99)
Glucose-Capillary: 266 mg/dL — ABNORMAL HIGH (ref 70–99)
Glucose-Capillary: 320 mg/dL — ABNORMAL HIGH (ref 70–99)
Glucose-Capillary: 325 mg/dL — ABNORMAL HIGH (ref 70–99)
Glucose-Capillary: 327 mg/dL — ABNORMAL HIGH (ref 70–99)
Glucose-Capillary: 477 mg/dL — ABNORMAL HIGH (ref 70–99)
Glucose-Capillary: 486 mg/dL — ABNORMAL HIGH (ref 70–99)
Glucose-Capillary: 534 mg/dL (ref 70–99)
Glucose-Capillary: 97 mg/dL (ref 70–99)
Glucose-Capillary: >600 mg/dL (ref 70–99)
Glucose-Capillary: >600 mg/dL (ref 70–99)
Glucose-Capillary: >600 mg/dL (ref 70–99)
Glucose-Capillary: >600 mg/dL (ref 70–99)

## 2019-02-08 LAB — URINE DRUG SCREEN, QUALITATIVE (ARMC ONLY)
Amphetamines, Ur Screen: NOT DETECTED
Barbiturates, Ur Screen: NOT DETECTED
Benzodiazepine, Ur Scrn: NOT DETECTED
Cannabinoid 50 Ng, Ur ~~LOC~~: NOT DETECTED
Cocaine Metabolite,Ur ~~LOC~~: POSITIVE — AB
MDMA (Ecstasy)Ur Screen: NOT DETECTED
Methadone Scn, Ur: NOT DETECTED
Opiate, Ur Screen: NOT DETECTED
Phencyclidine (PCP) Ur S: NOT DETECTED
Tricyclic, Ur Screen: NOT DETECTED

## 2019-02-08 LAB — MRSA PCR SCREENING: MRSA by PCR: NEGATIVE

## 2019-02-08 LAB — POTASSIUM
Potassium: 4 mmol/L (ref 3.5–5.1)
Potassium: 3.9 mmol/L (ref 3.5–5.1)

## 2019-02-08 LAB — MAGNESIUM: Magnesium: 3.2 mg/dL — ABNORMAL HIGH (ref 1.7–2.4)

## 2019-02-08 MED ORDER — POTASSIUM CHLORIDE 10 MEQ/100ML IV SOLN
10.0000 meq | INTRAVENOUS | Status: AC
  Administered 2019-02-08 (×4): 10 meq via INTRAVENOUS
  Filled 2019-02-08 (×4): qty 100

## 2019-02-08 MED ORDER — INSULIN DETEMIR 100 UNIT/ML ~~LOC~~ SOLN
16.0000 [IU] | SUBCUTANEOUS | Status: DC
  Administered 2019-02-08 – 2019-02-09 (×2): 16 [IU] via SUBCUTANEOUS
  Filled 2019-02-08 (×3): qty 0.16

## 2019-02-08 MED ORDER — CALCIUM GLUCONATE-NACL 1-0.675 GM/50ML-% IV SOLN
1.0000 g | Freq: Once | INTRAVENOUS | Status: DC
  Filled 2019-02-08: qty 50

## 2019-02-08 MED ORDER — HEPARIN SODIUM (PORCINE) 5000 UNIT/ML IJ SOLN
5000.0000 [IU] | Freq: Three times a day (TID) | INTRAMUSCULAR | Status: DC
  Administered 2019-02-08 – 2019-02-13 (×16): 5000 [IU] via SUBCUTANEOUS
  Filled 2019-02-08 (×16): qty 1

## 2019-02-08 MED ORDER — INSULIN REGULAR(HUMAN) IN NACL 100-0.9 UT/100ML-% IV SOLN
INTRAVENOUS | Status: DC
  Administered 2019-02-08: 20.9 [IU]/h via INTRAVENOUS
  Filled 2019-02-08: qty 100

## 2019-02-08 MED ORDER — POTASSIUM CHLORIDE 10 MEQ/100ML IV SOLN
10.0000 meq | INTRAVENOUS | Status: AC
  Administered 2019-02-08 (×2): 10 meq via INTRAVENOUS
  Filled 2019-02-08 (×2): qty 100

## 2019-02-08 MED ORDER — INSULIN ASPART 100 UNIT/ML ~~LOC~~ SOLN
0.0000 [IU] | Freq: Every day | SUBCUTANEOUS | Status: DC
  Administered 2019-02-08: 3 [IU] via SUBCUTANEOUS
  Filled 2019-02-08: qty 1

## 2019-02-08 MED ORDER — DEXTROSE-NACL 5-0.45 % IV SOLN
INTRAVENOUS | Status: DC
  Administered 2019-02-08: 09:00:00 via INTRAVENOUS

## 2019-02-08 MED ORDER — SODIUM CHLORIDE 0.9 % IV SOLN
INTRAVENOUS | Status: DC
  Administered 2019-02-08: 02:00:00 via INTRAVENOUS

## 2019-02-08 MED ORDER — SODIUM CHLORIDE 0.9 % IV SOLN
INTRAVENOUS | Status: DC
  Administered 2019-02-08: 03:00:00 via INTRAVENOUS

## 2019-02-08 MED ORDER — INSULIN ASPART 100 UNIT/ML ~~LOC~~ SOLN
0.0000 [IU] | Freq: Three times a day (TID) | SUBCUTANEOUS | Status: DC
  Administered 2019-02-08: 2 [IU] via SUBCUTANEOUS
  Administered 2019-02-09: 15 [IU] via SUBCUTANEOUS
  Administered 2019-02-09: 2 [IU] via SUBCUTANEOUS
  Filled 2019-02-08 (×3): qty 1

## 2019-02-08 MED ORDER — INSULIN ASPART 100 UNIT/ML ~~LOC~~ SOLN
3.0000 [IU] | Freq: Three times a day (TID) | SUBCUTANEOUS | Status: DC
  Administered 2019-02-08: 3 [IU] via SUBCUTANEOUS
  Administered 2019-02-09: 5 [IU] via SUBCUTANEOUS
  Administered 2019-02-09: 3 [IU] via SUBCUTANEOUS
  Filled 2019-02-08 (×2): qty 1

## 2019-02-08 NOTE — H&P (Signed)
Spencer [name] is an 58 y.o. male.   Chief Complaint: Hyperglycemia HPI: The patient with past medical history of diabetes type 1, hypertension and polysubstance abuse presents to the emergency department via EMS after his family called due to hyperglycemia.  Glucometer read "high at home.  In the emergency department the patient was found to have a blood sugar greater than 1500 as well as an enlarged anion gap assistant with diabetic ketoacidosis.  The patient endorses nonbloody emesis but cannot provide much history at this time.  He was started on insulin drip prior to the emergency department staff calling the hospitalist service for admission.  Past Medical History:  Diagnosis Date  . Brain aneurysm   . Diabetes mellitus without complication ([place])   . Hypertension   . Substance abuse [facility])     Past Surgical History:  Procedure Laterality Date  . HERNIA REPAIR    . NECK SURGERY      Family History  Problem Relation Age of Onset  . CAD Sister   . Hypertension Sister   . Healthy Mother   . Healthy Father    Social History:  reports that he has been smoking cigarettes. He has a 17.00 pack-year smoking history. He has never used smokeless tobacco. He reports current alcohol use of about 1.0 standard drinks of alcohol per week. He reports current drug use. Drug: Cocaine.  Allergies: No Known Allergies  Prior to Admission medications   Medication Sig Start Date End Date Taking? Authorizing Provider  amLODipine (NORVASC) 10 MG tablet Take 1 tablet (10 mg total) by mouth daily. 01/19/19   Bettey Costa, MD  atorvastatin (LIPITOR) 10 MG tablet Take 1 tablet (10 mg total) by mouth daily. 01/08/19   Mayo, Pete Pelt, MD  blood glucose meter kit and supplies Check blood sugar 4 times daily 01/08/19   Mayo, Pete Pelt, MD  cyclobenzaprine (FLEXERIL) 10 MG tablet Take 1 tablet (10 mg total) by mouth 3 (three) times daily as needed. 02/13/18   Sable Feil, PA-C  gabapentin (NEURONTIN) 300 MG capsule  Take 2 capsules (600 mg total) by mouth 3 (three) times daily. 01/08/19   Mayo, Pete Pelt, MD  insulin aspart (NOVOLOG) 100 UNIT/ML FlexPen Inject 5 Units into the skin 3 (three) times daily with meals for 30 days. 01/08/19 02/07/19  Mayo, Pete Pelt, MD  Insulin Pen Needle 31G X 5 MM MISC Check blood sugars 4 times daily 01/08/19   Mayo, Pete Pelt, MD  nicotine (NICODERM CQ - DOSED IN MG/24 HOURS) 21 mg/24hr patch Place 1 patch (21 mg total) onto the skin daily. 01/18/19   Bettey Costa, MD     Results for orders placed or performed during the hospital encounter of 02/07/19 (from the past 48 hour(s))  Blood gas, venous     Status: Abnormal   Collection Time: 02/07/19 10:09 PM  Result Value Ref Range   pH, Ven 7.05 (LL) 7.250 - 7.430    Comment: CRITICAL RESULT CALLED TO, READ BACK BY AND VERIFIED WITH: APRIL,BUMGARD RN AT 2230 ON [id number] BY SMATHEW RRT    pCO2, Ven 28 (L) 44.0 - 60.0 mmHg   pO2, Ven 80.0 (H) 32.0 - 45.0 mmHg   Bicarbonate 7.7 (L) 20.0 - 28.0 mmol/L   Acid-base deficit 21.5 (H) 0.0 - 2.0 mmol/L   O2 Saturation 88.5 %   Patient temperature 37.0    Collection site VENOUS    Sample type VENOUS     Comment: Performed at [facility], [address] [place], [place] [address]  Comprehensive metabolic panel     Status: Abnormal   Collection Time: 02/07/19 10:17 PM  Result Value Ref Range   Sodium 128 (L) 135 - 145 mmol/L   Potassium 6.6 (HH) 3.5 - 5.1 mmol/L    Comment: CRITICAL RESULT CALLED TO, READ BACK BY AND VERIFIED WITH JOHN HOFFMASTER _0  ON 02/07/2019 BY FMW    Chloride 79 (L) 98 - 111 mmol/L   CO2 <7 (L) 22 - 32 mmol/L   Glucose, Bld 1,533 (HH) 70 - 99 mg/dL    Comment: RESULT CONFIRMED BY MANUAL DILUTION CRITICAL RESULT CALLED TO, READ BACK BY AND VERIFIED WITH JOHN HOFFMASTER _1  ON 02/07/2019 BY FMW    BUN 69 (H) 6 - 20 mg/dL   Creatinine, Ser 3.84 (H) 0.61 - 1.24 mg/dL   Calcium 9.0 8.9 - 10.3 mg/dL   Total Protein 7.3 6.5 - 8.1 g/dL   Albumin  4.5 3.5 - 5.0 g/dL   AST 16 15 - 41 U/L   ALT 22 0 - 44 U/L   Alkaline Phosphatase 115 38 - 126 U/L   Total Bilirubin 2.2 (H) 0.3 - 1.2 mg/dL   GFR calc non Af Amer 16 (L) >60 mL/min   GFR calc Af Amer 19 (L) >60 mL/min   Anion gap NOT CALCULATED 5 - 15    Comment: Performed at [facility], [address] [place], [place] [address]  Ethanol     Status: None   Collection Time: 02/07/19 10:17 PM  Result Value Ref Range   Alcohol, Ethyl (B) <10 <10 mg/dL    Comment: (NOTE) Lowest detectable limit for serum alcohol is 10 mg/dL. For medical purposes only. Performed at [facility], [address] [place], [place] [address]   Lipase, blood     Status: None   Collection Time: 02/07/19 10:17 PM  Result Value Ref Range   Lipase 48 11 - 51 U/L    Comment: Performed at [facility], [address] [place], [place] [address]  CBC with Differential     Status: Abnormal   Collection Time: 02/07/19 10:17 PM  Result Value Ref Range   WBC 17.5 (H) 4.0 - 10.5 K/uL   RBC 4.70 4.22 - 5.81 MIL/uL   Hemoglobin 14.7 13.0 - 17.0 g/dL   HCT 50.1 39.0 - 52.0 %   MCV 106.6 (H) 80.0 - 100.0 fL   MCH 31.3 26.0 - 34.0 pg   MCHC 29.3 (L) 30.0 - 36.0 g/dL   RDW 13.8 11.5 - 15.5 %   Platelets 307 150 - 400 K/uL   nRBC 0.0 0.0 - 0.2 %   Neutrophils Relative % 91 %   Neutro Abs 15.9 (H) 1.7 - 7.7 K/uL   Lymphocytes Relative 4 %   Lymphs Abs 0.7 0.7 - 4.0 K/uL   Monocytes Relative 4 %   Monocytes Absolute 0.7 0.1 - 1.0 K/uL   Eosinophils Relative 0 %   Eosinophils Absolute 0.0 0.0 - 0.5 K/uL   Basophils Relative 0 %   Basophils Absolute 0.0 0.0 - 0.1 K/uL   Immature Granulocytes 1 %   Abs Immature Granulocytes 0.11 (H) 0.00 - 0.07 K/uL    Comment: Performed at [facility], [address] [place], [place] [address]  Acetaminophen level     Status: Abnormal   Collection Time: 02/07/19 10:17 PM  Result Value Ref Range   Acetaminophen (Tylenol), Serum <10  (L)  10 - 30 ug/mL    Comment: (NOTE) Therapeutic concentrations vary significantly. A range of 10-30 ug/mL  may be an effective concentration for many patients. However, some  are best treated at concentrations outside of this range. Acetaminophen concentrations >150 ug/mL at 4 hours after ingestion  and >50 ug/mL at 12 hours after ingestion are often associated with  toxic reactions. Performed at [facility], [address] [place], [place] [address]   Salicylate level     Status: None   Collection Time: 02/07/19 10:17 PM  Result Value Ref Range   Salicylate Lvl <0.9 2.8 - 30.0 mg/dL    Comment: Performed at [facility], [address] [place], [place] [address]  Glucose, capillary     Status: Abnormal   Collection Time: 02/07/19 10:44 PM  Result Value Ref Range   Glucose-Capillary >600 (HH) 70 - 99 mg/dL   No results found.  Review of Systems  Constitutional: Negative for chills and fever.  HENT: Negative for sore throat and tinnitus.   Eyes: Negative for blurred vision and redness.  Respiratory: Negative for cough and shortness of breath.   Cardiovascular: Negative for chest pain, palpitations, orthopnea and PND.  Gastrointestinal: Positive for vomiting. Negative for abdominal pain, diarrhea and nausea.  Genitourinary: Negative for dysuria, frequency and urgency.  Musculoskeletal: Negative for joint pain and myalgias.  Skin: Negative for rash.       No lesions  Neurological: Negative for speech change, focal weakness and weakness.  Endo/Heme/Allergies: Does not bruise/bleed easily.       No temperature intolerance  Psychiatric/Behavioral: Negative for depression and suicidal ideas.    Blood pressure (!) 149/93, pulse 98, temperature (!) 96.2 F (35.7 C), temperature source Axillary, resp. rate 20, height 6' (1.829 m), weight 60 kg, SpO2 96 %. Physical Exam  Constitutional: He appears well-developed and well-nourished. No distress.  HENT:  Head:  Normocephalic and atraumatic.  Mouth/Throat: Oropharynx is clear and moist.  Eyes: Pupils are equal, round, and reactive to light. Conjunctivae and EOM are normal. No scleral icterus.  Neck: Normal range of motion. Neck supple. No JVD present. No tracheal deviation present. No thyromegaly present.  Cardiovascular: Normal rate, regular rhythm and normal heart sounds. Exam reveals no gallop and no friction rub.  No murmur heard. Respiratory: Effort normal and breath sounds normal. No respiratory distress.  GI: Soft. Bowel sounds are normal. He exhibits no distension. There is no abdominal tenderness.  Genitourinary:    Genitourinary Comments: Deferred   Musculoskeletal: Normal range of motion.        General: No edema.  Lymphadenopathy:    He has no cervical adenopathy.  Neurological: No cranial nerve deficit.  Conscious but not very alert  Skin: Skin is warm and dry. No rash noted. No erythema.  Psychiatric: He has a normal mood and affect. His behavior is normal. Judgment and thought content normal.     Assessment/Plan This is a 58 year old male admitted for DKA. 1.  DKA: Continue insulin drip until anion gap closes which time we will start long-acting insulin and allow the patient to eat.  Hydrate with normal saline until blood sugar is below 200 at which time start dextrose solution.  DKA may have been induced by alcohol (presumably yesterday as ethanol level is less than 10).  Monitor for signs or symptoms of withdrawal 2.  Acute kidney injury: Secondary to dehydration; intravenous fluid as above.  Nephrotoxic agents. 3.  Hypertension: Less than optimal; continue amlodipine.  Labetalol as needed. 4.  Tobacco abuse: Apply NicoDerm patch 5.  Hyperkalemia: Recheck potassium.  EKG normal. 6.  Underweight: BMI is 17.9; the patient is malnourished.  Etiology likely combination of poorly controlled type 1 diabetes as well as socioeconomic.  Case management consult. 7.  DVT prophylaxis:  Heparin 8.  GI prophylaxis: None The patient is a full code.  I personally spent 45 minutes in critical care time with this patient.  Harrie Foreman, MD 02/08/2019, 12:07 AM

## 2019-02-08 NOTE — Progress Notes (Signed)
PHARMACY CONSULT NOTE   Pharmacy Consult for Electrolyte Monitoring and Replacement   Recent Labs: Potassium (mmol/L)  Date Value  02/08/2019 4.0   Magnesium (mg/dL)  Date Value  63/33/5456 3.2 (H)   Calcium (mg/dL)  Date Value  25/63/8937 8.9   Albumin (g/dL)  Date Value  34/28/7681 4.5   Phosphorus (mg/dL)  Date Value  15/72/6203 2.8   Sodium (mmol/L)  Date Value  02/08/2019 150 (H)     Assessment: 58 yo male admitted with acute encephalopathy and DKA requiring insulin drip.   Goal of Therapy:  K = 4.0 Mag = 2.0  Plan:  Potassium WNL. Will order another potassium level in 4 hours (~2200). Pt still on insulin gtt.   Recheck electrolytes with am labs.   Ronnald Ramp, PharmD, BCPS Clinical Pharmacist 02/08/2019 6:54 PM

## 2019-02-08 NOTE — Progress Notes (Addendum)
Inpatient Diabetes Program Recommendations  AACE/ADA: New Consensus Statement on Inpatient Glycemic Control (2015)  Target Ranges:  Prepandial:   less than 140 mg/dL      Peak postprandial:   less than 180 mg/dL (1-2 hours)      Critically ill patients:  140 - 180 mg/dL   Lab Results  Component Value Date   GLUCAP 115 (H) 02/08/2019   HGBA1C 11.3 (H) 01/06/2019    Review of Glycemic Control Results for Spencer [name], Spencer [name] (MRN [id number]) as of 02/08/2019 12:37  Ref. Range 02/08/2019 09:18 02/08/2019 10:21 02/08/2019 11:38  Glucose-Capillary Latest Ref Range: 70 - 99 mg/dL 206 (H) 015 (H) 615 (H)   Diabetes history: type 1 DM Outpatient Diabetes medications: Tresiba 20 units QD, Novolog 5 units TID Current orders for Inpatient glycemic control: IV insulin  Inpatient Diabetes Program Recommendations:    When ready to transition, consider adding Levemir 16 units QD 2 hours prior to discontinuation of IV insulin.  Additionally, consider Novolog 0-9 units TID, Novolog 0-5 units QHS and Novolog 3 units TID (assuming that patient is consuming >50% of meal). Noted elevated CBG of 486 mg/dL, even after conversation with RN assuming this is an outlier. Requested glucose serum.  Noted A1C, will plan to reach out to patient.  @1300 : Reached out to patient to discuss outpatient DM management. Patient ran out of medication and has not been taking >1 month. Verified insulin dosages prior.  Reviewed patient's current A1c of 11.3%. Explained what a A1c is and what it measures. Also reviewed goal A1c with patient, importance of good glucose control @ home, and blood sugar goals. Discussed difficulty with patient obtaining medications for DM. Currently, patient has no income. Will place CM consult to help aid with this. Also discussed Relion products with patient. He was unaware of this, however, cost maybe an issue. Will continue to follow. Patient has no further questions at this time. Discussed plan of care with  CCM MD. Verified current glucose trends, previous 2 BMETs, IV fluids showing in [facility], consult per RN and to transition to SubQ with current recommendations above and with carb modified diet. Orders received.   Thanks, Spencer Rave, MSN, RNC-OB Diabetes Coordinator [phone] (8a-5p)

## 2019-02-08 NOTE — Progress Notes (Addendum)
PHARMACY CONSULT NOTE   Pharmacy Consult for Electrolyte Monitoring and Replacement   Recent Labs: Potassium (mmol/L)  Date Value  02/08/2019 3.9   Magnesium (mg/dL)  Date Value  02/58/5277 3.2 (H)   Calcium (mg/dL)  Date Value  82/42/3536 8.9   Albumin (g/dL)  Date Value  14/43/1540 4.5   Phosphorus (mg/dL)  Date Value  08/67/6195 2.8   Sodium (mmol/L)  Date Value  02/08/2019 150 (H)     Assessment: 58 yo male admitted with acute encephalopathy and DKA requiring insulin drip.   Goal of Therapy:  K = 4.0 Mag = 2.0  Plan:  04/05 AM: No replacement needed at this time. Patient no longer on insulin infusion for DKA.   Will recheck electrolytes w/AM labs if no replacement warranted pharmacy will plan to sign off.     Gardner Candle, PharmD, BCPS Clinical Pharmacist 02/08/2019 10:19 PM

## 2019-02-08 NOTE — Progress Notes (Signed)
[facility] - [place] [facility]   PATIENT NAME: Spencer [name]    MR#:  [id number]  DATE OF BIRTH:  October 14, 1961  SUBJECTIVE:  CHIEF COMPLAINT:   Chief Complaint  Patient presents with  . Altered Mental Status  . Hyperglycemia   The patient is lethargic. He did not answered questions. REVIEW OF SYSTEMS:  Review of Systems  Unable to perform ROS: Critical illness    DRUG ALLERGIES:  No Known Allergies VITALS:  Blood pressure (!) 146/90, pulse 84, temperature 97.9 F (36.6 C), temperature source Axillary, resp. rate 17, height 6' (1.829 m), weight 60.5 kg, SpO2 93 %. PHYSICAL EXAMINATION:  Physical Exam Constitutional:      Comments: Lethargic and drowsy.  HENT:     Head: Normocephalic.  Eyes:     General: No scleral icterus.    Conjunctiva/sclera: Conjunctivae normal.     Pupils: Pupils are equal, round, and reactive to light.  Neck:     Musculoskeletal: Normal range of motion and neck supple.     Vascular: No JVD.     Trachea: No tracheal deviation.  Cardiovascular:     Rate and Rhythm: Normal rate and regular rhythm.     Heart sounds: Normal heart sounds. No murmur. No gallop.   Pulmonary:     Effort: Pulmonary effort is normal. No respiratory distress.     Breath sounds: Normal breath sounds. No wheezing or rales.  Abdominal:     General: Bowel sounds are normal. There is no distension.     Palpations: Abdomen is soft.     Tenderness: There is no abdominal tenderness. There is no rebound.  Musculoskeletal:        General: No tenderness.     Right lower leg: No edema.     Left lower leg: No edema.  Skin:    Findings: No erythema or rash.  Neurological:     Comments: Unable to exam.    LABORATORY PANEL:  Male CBC Recent Labs  Lab 02/07/19 2217  WBC 17.5*  HGB 14.7  HCT 50.1  PLT 307   ------------------------------------------------------------------------------------------------------------------ Chemistries  Recent Labs  Lab  02/07/19 2217  02/08/19 0949  NA 128*   < > 152*  K 6.6*   < > 3.3*  CL 79*   < > 115*  CO2 <7*   < > 29  GLUCOSE 1,533*   < > 148*  BUN 69*   < > 53*  CREATININE 3.84*   < > 1.97*  CALCIUM 9.0   < > 9.5  MG  --   --  3.2*  AST 16  --   --   ALT 22  --   --   ALKPHOS 115  --   --   BILITOT 2.2*  --   --    < > = values in this interval not displayed.   RADIOLOGY:  Dg Chest Port 1 View  Result Date: 02/08/2019 CLINICAL DATA:  Cough. EXAM: PORTABLE CHEST 1 VIEW COMPARISON:  January 17, 2019 FINDINGS: No pneumothorax. The cardiomediastinal silhouette is normal. No pulmonary nodules or masses. No focal infiltrates. No overt edema. IMPRESSION: No active disease. Electronically Signed   By: Gerome Sam III M.D   On: 02/08/2019 00:26   ASSESSMENT AND PLAN:   This is a 58 year old male admitted for DKA. 1.  DKA:  Try to wean down insulin drip and will start long-acting insulin and allow the patient to eat. Continue IVF. Follow up  BMP.  Acute metabolic encephalopathy, due to above.  2.  Acute kidney injury: Secondary to dehydration; intravenous fluid as above.  Nephrotoxic agents. Follow up BMP.  3.  Hypertension: Less than optimal; continue amlodipine.  Labetalol as needed. 4.  Tobacco abuse: NicoDerm patch 5.  Hyperkalemia: improved. Hypokalemia. Supplement. Hypernatremia. May change to D51/2NS iv. Follow up Na. 6.  Moderate malnutrition. Follow dietitian's recommendation.  All the records are reviewed and case discussed with Care Management/Social Worker. Management plans discussed with the patient, family and they are in agreement.  CODE STATUS: Full Code  TOTAL TIME TAKING CARE OF THIS PATIENT: 32 minutes.   More than 50% of the time was spent in counseling/coordination of care: YES  POSSIBLE D/C IN 2 DAYS, DEPENDING ON CLINICAL CONDITION.   Shaune Pollack M.D on 02/08/2019 at 12:20 PM  Between 7am to 6pm - Pager - [phone]  After 6pm go to [email or web address] - [facility]

## 2019-02-08 NOTE — Progress Notes (Signed)
PHARMACY CONSULT NOTE   Pharmacy Consult for Electrolyte Monitoring and Replacement   Recent Labs: Potassium (mmol/L)  Date Value  02/08/2019 3.3 (L)   Magnesium (mg/dL)  Date Value  50/38/8828 1.9   Calcium (mg/dL)  Date Value  00/34/9179 9.2   Albumin (g/dL)  Date Value  15/03/6978 4.5   Phosphorus (mg/dL)  Date Value  48/11/6551 2.8   Sodium (mmol/L)  Date Value  02/08/2019 145     Assessment: 58 yo male admitted with acute encephalopathy and DKA requiring insulin drip.  K = 3.3  Goal of Therapy:  K = 4.0 Mag = 2.0  Plan:  Patient received KCL IV this morning. Ordered another IV.  Recheck K level this evening at 18:00.   Recheck electrolytes with am labs.   Stormy Card [facility] Clinical Pharmacist 02/08/2019 9:16 AM

## 2019-02-08 NOTE — Consult Note (Signed)
Name: Spencer [name] MRN: [id number] DOB: 07/12/1961    ADMISSION DATE:  02/07/2019 CONSULTATION DATE: 02/08/2019  REFERRING MD : Dr. Marcille Blanco  CHIEF COMPLAINT: AMS and Hyperglycemia   BRIEF PATIENT DESCRIPTION:  58 yo male admitted with acute encephalopathy and DKA requiring insulin gtt   SIGNIFICANT EVENTS/STUDIES:  04/4-Pt admitted to the stepdown unit   HISTORY OF PRESENT ILLNESS:   This is a 58 yo male with a PMH of Brain Aneurysm, Substance Abuse, HTN, and Uncontrolled Type II Diabetes Mellitus.  He presented to [facility] ER on 04/3 per EMS with altered mental status concerning for possible ETOH ingestion per family and hyperglycemia.  In the ER lab results revealed alcohol level <76 and salicylate level <7.3.  Lab results ruled pt in for DKA, therefore insulin gtt initiated. CXR negative.  He was subsequently admitted to the stepdown unit by hospitalist team for additional workup and treatment.    PAST MEDICAL HISTORY :   has a past medical history of Brain aneurysm, Diabetes mellitus without complication ([place]), Hypertension, and Substance abuse ([place]).  has a past surgical history that includes Neck surgery and Hernia repair. Prior to Admission medications   Medication Sig Start Date End Date Taking? Authorizing Provider  amLODipine (NORVASC) 10 MG tablet Take 1 tablet (10 mg total) by mouth daily. 01/19/19   Bettey Costa, MD  atorvastatin (LIPITOR) 10 MG tablet Take 1 tablet (10 mg total) by mouth daily. 01/08/19   Mayo, Pete Pelt, MD  blood glucose meter kit and supplies Check blood sugar 4 times daily 01/08/19   Mayo, Pete Pelt, MD  cyclobenzaprine (FLEXERIL) 10 MG tablet Take 1 tablet (10 mg total) by mouth 3 (three) times daily as needed. 02/13/18   Sable Feil, PA-C  gabapentin (NEURONTIN) 300 MG capsule Take 2 capsules (600 mg total) by mouth 3 (three) times daily. 01/08/19   Mayo, Pete Pelt, MD  insulin aspart (NOVOLOG) 100 UNIT/ML FlexPen Inject 5 Units into the skin 3 (three) times  daily with meals for 30 days. 01/08/19 02/07/19  Mayo, Pete Pelt, MD  Insulin Pen Needle 31G X 5 MM MISC Check blood sugars 4 times daily 01/08/19   Mayo, Pete Pelt, MD  nicotine (NICODERM CQ - DOSED IN MG/24 HOURS) 21 mg/24hr patch Place 1 patch (21 mg total) onto the skin daily. 01/18/19   Bettey Costa, MD   No Known Allergies  FAMILY HISTORY:  family history includes CAD in his sister; Healthy in his father and mother; Hypertension in his sister. SOCIAL HISTORY:  reports that he has been smoking cigarettes. He has a 17.00 pack-year smoking history. He has never used smokeless tobacco. He reports current alcohol use of about 1.0 standard drinks of alcohol per week. He reports current drug use. Drug: Cocaine.  REVIEW OF SYSTEMS: Unable to assess pt confused   SUBJECTIVE:  Pt currently confused   VITAL SIGNS: Temp:  [96.2 F (35.7 C)] 96.2 F (35.7 C) (04/03 2209) Pulse Rate:  [98] 98 (04/03 2205) Resp:  [20] 20 (04/03 2205) BP: (149)/(93) 149/93 (04/03 2205) SpO2:  [96 %] 96 % (04/03 2205) Weight:  [60 kg] 60 kg (04/03 2208)  PHYSICAL EXAMINATION: General: acutely ill appearing male, NAD Neuro: confused, responds to verbal stimulation, PERRL HEENT: supple, no JVD Cardiovascular: nsr. Rrr, no R/G Lungs: clear throughout, even, non labored  Abdomen: +BS x4, soft, non tender, non distended  Musculoskeletal: normal bulk and tone, no edema  Skin: intact no rashes or lesions  Recent Labs  Lab 02/07/19 2217  NA 128*  K 6.6*  CL 79*  CO2 <7*  BUN 69*  CREATININE 3.84*  GLUCOSE 1,533*   Recent Labs  Lab 02/07/19 2217  HGB 14.7  HCT 50.1  WBC 17.5*  PLT 307   No results found.  ASSESSMENT / PLAN:  Diabetic ketoacidosis  Continue insulin gtt until anion gap closed and serum CO2 >20 BMP q4hrs and CBG q1hr while on insulin gtt  Acute renal failure secondary to dehydration  Hyperkalemia  Pseudohyponatremia in setting of DKA Trend BMP  Replace electrolytes as indicated   Monitor UOP  Avoid nephrotoxic medications  IV fluids per DKA protocol   Leukocytosis no obvious signs of infection  Trend WBC and monitor fever curve  Will check PCT if elevated will start empiric abx UA pending   Acute encephalopathy likely secondary to metabolic derangement in setting of DKA Hx: Polysubstance abuse   Avoid sedating medications  Urine drug screen pending    Marda Stalker, Faith Pager [phone] (please enter 7 digits) PCCM Consult Pager [phone] (please enter 7 digits)

## 2019-02-08 NOTE — ED Notes (Signed)
ED TO INPATIENT HANDOFF REPORT  ED Nurse Name and Phone #: Raphael Gibney Name/Age/Gender Spencer [name] 58 y.o. male Room/Bed: ED26A/ED26A  Code Status   Code Status: Prior  Home/SNF/Other Home Patient oriented to: self Is this baseline? No   Triage Complete: Triage complete  Chief Complaint aMS  Triage Note Pt from home with ams and hyperglycemia. Pt is alert to self, place, date, but unsure of situation. Per ems family called for hyperglycemia. Pt with "high" blood sugar on our CBG. Possible ETOH ingestion today.    Allergies No Known Allergies  Level of Care/Admitting Diagnosis ED Disposition    ED Disposition Condition Comment   Admit  Hospital Area: [facility] [[id number]]  Level of Care: Stepdown [14]  Diagnosis: DKA (diabetic ketoacidoses) [facility]) [[id number]]  Admitting Physician: Arnaldo Natal [[id number]]  Attending Physician: Arnaldo Natal [[id number]]  Estimated length of stay: past midnight tomorrow  Certification:: I certify this patient will need inpatient services for at least 2 midnights  PT Class (Do Not Modify): Inpatient [101]  PT Acc Code (Do Not Modify): Private [1]       B Medical/Surgery History Past Medical History:  Diagnosis Date  . Brain aneurysm   . Diabetes mellitus without complication (HCC)   . Hypertension   . Substance abuse [facility])    Past Surgical History:  Procedure Laterality Date  . HERNIA REPAIR    . NECK SURGERY       A IV Location/Drains/Wounds Patient Lines/Drains/Airways Status   Active Line/Drains/Airways    Name:   Placement date:   Placement time:   Site:   Days:   Peripheral IV 02/07/19 Left Antecubital   02/07/19    2216    Antecubital   1   Peripheral IV 02/07/19 Right Forearm   02/07/19    -    Forearm   1          Intake/Output Last 24 hours No intake or output data in the 24 hours ending 02/08/19 0049  Labs/Imaging Results for orders placed or performed during the hospital encounter  of 02/07/19 (from the past 48 hour(s))  Blood gas, venous     Status: Abnormal   Collection Time: 02/07/19 10:09 PM  Result Value Ref Range   pH, Ven 7.05 (LL) 7.250 - 7.430    Comment: CRITICAL RESULT CALLED TO, READ BACK BY AND VERIFIED WITH: APRIL,BUMGARD RN AT 2230 ON [id number] BY SMATHEW RRT    pCO2, Ven 28 (L) 44.0 - 60.0 mmHg   pO2, Ven 80.0 (H) 32.0 - 45.0 mmHg   Bicarbonate 7.7 (L) 20.0 - 28.0 mmol/L   Acid-base deficit 21.5 (H) 0.0 - 2.0 mmol/L   O2 Saturation 88.5 %   Patient temperature 37.0    Collection site VENOUS    Sample type VENOUS     Comment: Performed at [facility], [address] [place], [place] [address]  Comprehensive metabolic panel     Status: Abnormal   Collection Time: 02/07/19 10:17 PM  Result Value Ref Range   Sodium 128 (L) 135 - 145 mmol/L   Potassium 6.6 (HH) 3.5 - 5.1 mmol/L    Comment: CRITICAL RESULT CALLED TO, READ BACK BY AND VERIFIED WITH Maximillian Habibi @2310  ON 02/07/2019 BY FMW    Chloride 79 (L) 98 - 111 mmol/L   CO2 <7 (L) 22 - 32 mmol/L   Glucose, Bld 1,533 (HH) 70 - 99 mg/dL    Comment: RESULT CONFIRMED BY  MANUAL DILUTION CRITICAL RESULT CALLED TO, READ BACK BY AND VERIFIED WITH Quaneshia Wareing @2310  ON 02/07/2019 BY FMW    BUN 69 (H) 6 - 20 mg/dL   Creatinine, Ser 6.38 (H) 0.61 - 1.24 mg/dL   Calcium 9.0 8.9 - 45.3 mg/dL   Total Protein 7.3 6.5 - 8.1 g/dL   Albumin 4.5 3.5 - 5.0 g/dL   AST 16 15 - 41 U/L   ALT 22 0 - 44 U/L   Alkaline Phosphatase 115 38 - 126 U/L   Total Bilirubin 2.2 (H) 0.3 - 1.2 mg/dL   GFR calc non Af Amer 16 (L) >60 mL/min   GFR calc Af Amer 19 (L) >60 mL/min   Anion gap NOT CALCULATED 5 - 15    Comment: Performed at [facility], [address] [place], [place] [address]  Ethanol     Status: None   Collection Time: 02/07/19 10:17 PM  Result Value Ref Range   Alcohol, Ethyl (B) <10 <10 mg/dL    Comment: (NOTE) Lowest detectable limit for serum alcohol is 10 mg/dL. For  medical purposes only. Performed at [facility], [address] [place], [place] [address]   Lipase, blood     Status: None   Collection Time: 02/07/19 10:17 PM  Result Value Ref Range   Lipase 48 11 - 51 U/L    Comment: Performed at [facility], [address] [place], [place] [address]  CBC with Differential     Status: Abnormal   Collection Time: 02/07/19 10:17 PM  Result Value Ref Range   WBC 17.5 (H) 4.0 - 10.5 K/uL   RBC 4.70 4.22 - 5.81 MIL/uL   Hemoglobin 14.7 13.0 - 17.0 g/dL   HCT 03.7 04.8 - 88.9 %   MCV 106.6 (H) 80.0 - 100.0 fL   MCH 31.3 26.0 - 34.0 pg   MCHC 29.3 (L) 30.0 - 36.0 g/dL   RDW 16.9 45.0 - 38.8 %   Platelets 307 150 - 400 K/uL   nRBC 0.0 0.0 - 0.2 %   Neutrophils Relative % 91 %   Neutro Abs 15.9 (H) 1.7 - 7.7 K/uL   Lymphocytes Relative 4 %   Lymphs Abs 0.7 0.7 - 4.0 K/uL   Monocytes Relative 4 %   Monocytes Absolute 0.7 0.1 - 1.0 K/uL   Eosinophils Relative 0 %   Eosinophils Absolute 0.0 0.0 - 0.5 K/uL   Basophils Relative 0 %   Basophils Absolute 0.0 0.0 - 0.1 K/uL   Immature Granulocytes 1 %   Abs Immature Granulocytes 0.11 (H) 0.00 - 0.07 K/uL    Comment: Performed at [facility], [address] [place], [place] [address]  Acetaminophen level     Status: Abnormal   Collection Time: 02/07/19 10:17 PM  Result Value Ref Range   Acetaminophen (Tylenol), Serum <10 (L) 10 - 30 ug/mL    Comment: (NOTE) Therapeutic concentrations vary significantly. A range of 10-30 ug/mL  may be an effective concentration for many patients. However, some  are best treated at concentrations outside of this range. Acetaminophen concentrations >150 ug/mL at 4 hours after ingestion  and >50 ug/mL at 12 hours after ingestion are often associated with  toxic reactions. Performed at [facility], [address] [place], [place] [address]   Salicylate level     Status: None   Collection Time: 02/07/19 10:17 PM  Result  Value Ref Range   Salicylate Lvl <7.0 2.8 - 30.0  mg/dL    Comment: Performed at [facility], [address] [place], [place] [address]  Glucose, capillary     Status: Abnormal   Collection Time: 02/07/19 10:44 PM  Result Value Ref Range   Glucose-Capillary >600 (HH) 70 - 99 mg/dL  Glucose, capillary     Status: Abnormal   Collection Time: 02/08/19 12:08 AM  Result Value Ref Range   Glucose-Capillary >600 (HH) 70 - 99 mg/dL   Dg Chest Port 1 View  Result Date: 02/08/2019 CLINICAL DATA:  Cough. EXAM: PORTABLE CHEST 1 VIEW COMPARISON:  January 17, 2019 FINDINGS: No pneumothorax. The cardiomediastinal silhouette is normal. No pulmonary nodules or masses. No focal infiltrates. No overt edema. IMPRESSION: No active disease. Electronically Signed   By: Gerome Sam III M.D   On: 02/08/2019 00:26    Pending Labs Unresulted Labs (From admission, onward)    Start     Ordered   02/08/19 0000  Potassium  ONCE - STAT,   STAT    Comments:  Repeat lab drawCall MD if K>4.9    02/08/19 0000   02/07/19 2209  Urinalysis, Complete w Microscopic  ONCE - STAT,   STAT     02/07/19 2208   02/07/19 2209  Urine Drug Screen, Qualitative  Once,   STAT     02/07/19 2208   Signed and Held  Basic metabolic panel  STAT Now then every 4 hours ,   STAT     Signed and Held          Vitals/Pain Today's Vitals   02/07/19 2205 02/07/19 2208 02/07/19 2209  BP: (!) 149/93    Pulse: 98    Resp: 20    Temp:   (!) 96.2 F (35.7 C)  TempSrc: Axillary  Axillary  SpO2: 96%    Weight:  60 kg   Height:  6' (1.829 m)     Isolation Precautions No active isolations  Medications Medications  insulin regular, human (MYXREDLIN) 100 units/ 100 mL infusion (5.4 Units/hr Intravenous New Bag/Given 02/07/19 2252)  sodium chloride 0.9 % bolus 1,000 mL (has no administration in time range)    And  0.9 %  sodium chloride infusion (has no administration in time range)  calcium gluconate inj 10% (1 g) URGENT USE  ONLY! (has no administration in time range)  sodium bicarbonate injection 50 mEq (has no administration in time range)  sodium chloride 0.9 % bolus 1,000 mL (1,000 mLs Intravenous New Bag/Given 02/07/19 2220)    Mobility walks High fall risk   Focused Assessments Cardiac Assessment Handoff:  Cardiac Rhythm: Normal sinus rhythm Lab Results  Component Value Date   CKTOTAL 164 10/23/2018   TROPONINI <0.03 01/17/2019   No results found for: DDIMER Does the Patient currently have chest pain? No     R Recommendations: See Admitting Provider Note  Report given to:   Additional Notes: none

## 2019-02-08 NOTE — Progress Notes (Signed)
Report called to Trish RN on 2C. Going to room 218. Informed sister [name] that patient was being moved and she was call that floor in the morning.

## 2019-02-08 NOTE — ED Notes (Signed)
CCU called for pt arriving

## 2019-02-08 NOTE — Progress Notes (Signed)
CRITICAL CARE NOTE       SUBJECTIVE FINDINGS & SIGNIFICANT EVENTS    Resting in bed comfortably, reports mild pain, asking to eat food   PAST MEDICAL HISTORY   Past Medical History:  Diagnosis Date  . Brain aneurysm   . Diabetes mellitus without complication (HCC)   . Hypertension   . Substance abuse (HCC)      SURGICAL HISTORY   Past Surgical History:  Procedure Laterality Date  . HERNIA REPAIR    . NECK SURGERY       FAMILY HISTORY   Family History  Problem Relation Age of Onset  . CAD Sister   . Hypertension Sister   . Healthy Mother   . Healthy Father      SOCIAL HISTORY   Social History   Tobacco Use  . Smoking status: Current Every Day Smoker    Packs/day: 0.50    Years: 34.00    Pack years: 17.00    Types: Cigarettes  . Smokeless tobacco: Never Used  Substance Use Topics  . Alcohol use: Yes    Alcohol/week: 1.0 standard drinks    Types: 1 Cans of beer per week  . Drug use: Yes    Types: Cocaine     MEDICATIONS   Current Medication:  Current Facility-Administered Medications:  .  0.9 %  sodium chloride infusion, , Intravenous, Continuous, Arnaldo Natal, MD, Stopped at 02/08/19 0920 .  dextrose 5 %-0.45 % sodium chloride infusion, , Intravenous, Continuous, Arnaldo Natal, MD, Last Rate: 75 mL/hr at 02/08/19 0918 .  heparin injection 5,000 Units, 5,000 Units, Subcutaneous, Q8H, Arnaldo Natal, MD, 5,000 Units at 02/08/19 0504 .  insulin aspart (novoLOG) injection 0-5 Units, 0-5 Units, Subcutaneous, QHS, Jenika Chiem, MD .  insulin aspart (novoLOG) injection 0-9 Units, 0-9 Units, Subcutaneous, TID WC, Hoang Pettingill, MD .  insulin aspart (novoLOG) injection 3 Units, 3 Units, Subcutaneous, TID WC, Nyomi Howser, MD .  insulin detemir (LEVEMIR)  injection 16 Units, 16 Units, Subcutaneous, Q24H, Nabeel Gladson, MD .  insulin regular, human (MYXREDLIN) 100 units/ 100 mL infusion, , Intravenous, Continuous, Arnaldo Natal, MD, Last Rate: 2.1 mL/hr at 02/08/19 1138, 2.1 Units/hr at 02/08/19 1138    ALLERGIES   Patient has no known allergies.    REVIEW OF SYSTEMS   10 system review of systems is negative except as per subjective findings  PHYSICAL EXAMINATION   Vitals:   02/08/19 1200 02/08/19 1400  BP: (!) 146/90 (!) 144/90  Pulse: 84 75  Resp: 17 18  Temp:  98.1 F (36.7 C)  SpO2: 93%     GENERAL: No distress due to abdominal pain and hunger HEAD: Normocephalic, atraumatic.  EYES: Pupils equal, round, reactive to light.  No scleral icterus.  MOUTH: Moist mucosal membrane. NECK: Supple. No thyromegaly. No nodules. No JVD.  PULMONARY: Auscultation bilaterally CARDIOVASCULAR: S1 and S2. Regular rate and rhythm. No murmurs, rubs, or gallops.  GASTROINTESTINAL: Soft, nontender, non-distended. No masses. Positive bowel sounds. No hepatosplenomegaly.  MUSCULOSKELETAL: No swelling, clubbing, or edema.  NEUROLOGIC: Mild distress due to acute illness SKIN:intact,warm,dry   LABS AND IMAGING     LAB RESULTS: Recent Labs  Lab 02/08/19 0224 02/08/19 0417 02/08/19 0949  NA 142 145 152*  K 4.0 3.3* 3.3*  CL 100 107 115*  CO2 17* 21* 29  BUN 63* 58* 53*  CREATININE 3.50* 3.00* 1.97*  GLUCOSE 991* 704* 148*   Recent Labs  Lab 02/07/19 2217  HGB  14.7  HCT 50.1  WBC 17.5*  PLT 307     IMAGING RESULTS: Dg Chest Port 1 View  Result Date: 02/08/2019 CLINICAL DATA:  Cough. EXAM: PORTABLE CHEST 1 VIEW COMPARISON:  January 17, 2019 FINDINGS: No pneumothorax. The cardiomediastinal silhouette is normal. No pulmonary nodules or masses. No focal infiltrates. No overt edema. IMPRESSION: No active disease. Electronically Signed   By: Gerome Sam III M.D   On: 02/08/2019 00:26      ASSESSMENT AND PLAN    Diabetic ketoacidosis -resolved Status post DKA protocol gap closed x2 on subcu weight-based regimen now   Acute kidney injury stage II    -Creatinine improved status post DKA reversal Trend BMP  Replace electrolytes as indicated    Leukocytosis no obvious signs of infection  Trend WBC and monitor fever curve  Will check PCT if elevated will start empiric abx UA pending   Acute encephalopathy likely secondary to metabolic derangement in setting of DKA Hx: Polysubstance abuse   Avoid sedating medications  Urine drug screen pending   GI/Nutrition GI PROPHYLAXIS as indicated DIET-->TF's as tolerated Constipation protocol as indicated  ENDO - ICU hypoglycemic\Hyperglycemia protocol -check FSBS per protocol   ELECTROLYTES -follow labs as needed -replace as needed -pharmacy consultation   DVT/GI PRX ordered -SCDs  TRANSFUSIONS AS NEEDED MONITOR FSBS ASSESS the need for LABS as needed   Critical care provider statement:    Critical care time (minutes):  0   Critical care time was exclusive of:  Separately billable procedures and treating other patients   Critical care was necessary to treat or prevent imminent or life-threatening deterioration of the following conditions:   Diabetic ketoacidosis improved, altered mental status improved   Critical care was time spent personally by me on the following activities:  Development of treatment plan with patient or surrogate, discussions with consultants, evaluation of patient's response to treatment, examination of patient, obtaining history from patient or surrogate, ordering and performing treatments and interventions, ordering and review of laboratory studies and re-evaluation of patient's condition.  I assumed direction of critical care for this patient from another provider in my specialty: no    This document was prepared using Dragon voice recognition software and may include unintentional dictation errors.    Vida Rigger, M.D.  Division of Pulmonary & Critical Care Medicine  [facility]

## 2019-02-08 NOTE — Progress Notes (Addendum)
Patient has remained alert and oriented.  He continues to be NSR.  His lungs sounds are clear and diminished.  He is transitioning off the IV insulin.  Long acting insulin given at 1623.  Patient's CBG has varied between locations.  The sides of his fingers are the closest to his blood glucose levels drawn by lab.  The tips of his fingers are higher.  Serum glucose obtained from lab for confirmation.  Christean Grief, RN

## 2019-02-09 ENCOUNTER — Other Ambulatory Visit: Payer: Self-pay

## 2019-02-09 LAB — BASIC METABOLIC PANEL
Anion gap: 8 (ref 5–15)
BUN: 54 mg/dL — ABNORMAL HIGH (ref 6–20)
CO2: 30 mmol/L (ref 22–32)
Calcium: 10 mg/dL (ref 8.9–10.3)
Chloride: 116 mmol/L — ABNORMAL HIGH (ref 98–111)
Creatinine, Ser: 1.72 mg/dL — ABNORMAL HIGH (ref 0.61–1.24)
GFR calc Af Amer: 49 mL/min — ABNORMAL LOW (ref >60)
GFR calc non Af Amer: 43 mL/min — ABNORMAL LOW (ref >60)
Glucose, Bld: 68 mg/dL — ABNORMAL LOW (ref 70–99)
Potassium: 4.8 mmol/L (ref 3.5–5.1)
Sodium: 154 mmol/L — ABNORMAL HIGH (ref 135–145)

## 2019-02-09 LAB — CBC WITH DIFFERENTIAL/PLATELET
Abs Immature Granulocytes: 0.06 10*3/uL (ref 0.00–0.07)
Basophils Absolute: 0 10*3/uL (ref 0.0–0.1)
Basophils Relative: 0 %
Eosinophils Absolute: 0.2 10*3/uL (ref 0.0–0.5)
Eosinophils Relative: 1 %
HCT: 44.1 % (ref 39.0–52.0)
Hemoglobin: 15.1 g/dL (ref 13.0–17.0)
Immature Granulocytes: 0 %
Lymphocytes Relative: 13 %
Lymphs Abs: 2 10*3/uL (ref 0.7–4.0)
MCH: 31.3 pg (ref 26.0–34.0)
MCHC: 34.2 g/dL (ref 30.0–36.0)
MCV: 91.3 fL (ref 80.0–100.0)
Monocytes Absolute: 0.7 10*3/uL (ref 0.1–1.0)
Monocytes Relative: 5 %
Neutro Abs: 11.8 10*3/uL — ABNORMAL HIGH (ref 1.7–7.7)
Neutrophils Relative %: 81 %
Platelets: 236 10*3/uL (ref 150–400)
RBC: 4.83 MIL/uL (ref 4.22–5.81)
RDW: 13.6 % (ref 11.5–15.5)
WBC: 14.7 10*3/uL — ABNORMAL HIGH (ref 4.0–10.5)
nRBC: 0 % (ref 0.0–0.2)

## 2019-02-09 LAB — GLUCOSE, CAPILLARY
Glucose-Capillary: 518 mg/dL (ref 70–99)
Glucose-Capillary: 308 mg/dL — ABNORMAL HIGH (ref 70–99)
Glucose-Capillary: 270 mg/dL — ABNORMAL HIGH (ref 70–99)

## 2019-02-09 LAB — MAGNESIUM: Magnesium: 2.9 mg/dL — ABNORMAL HIGH (ref 1.7–2.4)

## 2019-02-09 MED ORDER — ATORVASTATIN CALCIUM 10 MG PO TABS: 10.0000 mg | ORAL_TABLET | Freq: Every day | ORAL | Status: DC

## 2019-02-09 MED ORDER — INSULIN ASPART 100 UNIT/ML ~~LOC~~ SOLN
0.0000 [IU] | Freq: Three times a day (TID) | SUBCUTANEOUS | Status: DC
  Administered 2019-02-09 – 2019-02-10 (×2): 8 [IU] via SUBCUTANEOUS
  Administered 2019-02-10 – 2019-02-11 (×3): 11 [IU] via SUBCUTANEOUS
  Administered 2019-02-12: 8 [IU] via SUBCUTANEOUS
  Administered 2019-02-12: 5 [IU] via SUBCUTANEOUS
  Filled 2019-02-09 (×7): qty 1

## 2019-02-09 MED ORDER — ATORVASTATIN CALCIUM 10 MG PO TABS
10.0000 mg | ORAL_TABLET | Freq: Every day | ORAL | Status: DC
  Administered 2019-02-09 – 2019-02-12 (×4): 10 mg via ORAL
  Filled 2019-02-09 (×4): qty 1

## 2019-02-09 MED ORDER — AMLODIPINE BESYLATE 10 MG PO TABS: 10.0000 mg | ORAL_TABLET | Freq: Every day | ORAL | Status: DC

## 2019-02-09 MED ORDER — AMLODIPINE BESYLATE 10 MG PO TABS
10.0000 mg | ORAL_TABLET | Freq: Every day | ORAL | Status: DC
  Administered 2019-02-09 – 2019-02-13 (×5): 10 mg via ORAL
  Filled 2019-02-09 (×5): qty 1

## 2019-02-09 MED ORDER — CYCLOBENZAPRINE HCL 10 MG PO TABS: 10.0000 mg | ORAL_TABLET | Freq: Three times a day (TID) | ORAL | Status: DC | PRN

## 2019-02-09 MED ORDER — NICOTINE 14 MG/24HR TD PT24
14.0000 mg | MEDICATED_PATCH | Freq: Every day | TRANSDERMAL | Status: DC
  Filled 2019-02-09 (×4): qty 1

## 2019-02-09 MED ORDER — INSULIN DETEMIR 100 UNIT/ML ~~LOC~~ SOLN
20.0000 [IU] | SUBCUTANEOUS | Status: DC
  Administered 2019-02-10: 10:00:00 20 [IU] via SUBCUTANEOUS
  Filled 2019-02-09 (×2): qty 0.2

## 2019-02-09 MED ORDER — SODIUM CHLORIDE 0.45 % IV SOLN
INTRAVENOUS | Status: DC
  Administered 2019-02-09 (×2): 100 mL/h via INTRAVENOUS
  Administered 2019-02-10 (×2): via INTRAVENOUS

## 2019-02-09 MED ORDER — GABAPENTIN 300 MG PO CAPS
300.0000 mg | ORAL_CAPSULE | Freq: Three times a day (TID) | ORAL | Status: DC
  Administered 2019-02-09 – 2019-02-13 (×11): 300 mg via ORAL
  Filled 2019-02-09 (×11): qty 1

## 2019-02-09 MED ORDER — INSULIN ASPART 100 UNIT/ML ~~LOC~~ SOLN
5.0000 [IU] | Freq: Three times a day (TID) | SUBCUTANEOUS | Status: DC
  Administered 2019-02-09 – 2019-02-11 (×5): 5 [IU] via SUBCUTANEOUS
  Filled 2019-02-09 (×4): qty 1

## 2019-02-09 MED ORDER — INSULIN ASPART 100 UNIT/ML ~~LOC~~ SOLN
0.0000 [IU] | Freq: Every day | SUBCUTANEOUS | Status: DC
  Administered 2019-02-09: 3 [IU] via SUBCUTANEOUS
  Administered 2019-02-10: 2 [IU] via SUBCUTANEOUS
  Administered 2019-02-12: 5 [IU] via SUBCUTANEOUS
  Filled 2019-02-09 (×3): qty 1

## 2019-02-09 NOTE — Progress Notes (Signed)
[employer] - [place] at [facility]   PATIENT NAME: Spencer [name]    MR#:  [id number]  DATE OF BIRTH:  09-12-1961  SUBJECTIVE:  CHIEF COMPLAINT:   Chief Complaint  Patient presents with  . Altered Mental Status  . Hyperglycemia   The patient feels better, he has no complaints.  Blood glucose increased to 518. REVIEW OF SYSTEMS:  Review of Systems  Constitutional: Negative for chills, fever and malaise/fatigue.  HENT: Negative for sore throat.   Eyes: Negative for blurred vision and double vision.  Respiratory: Negative for cough, hemoptysis, shortness of breath, wheezing and stridor.   Cardiovascular: Negative for chest pain, palpitations, orthopnea and leg swelling.  Gastrointestinal: Negative for abdominal pain, blood in stool, diarrhea, melena, nausea and vomiting.  Genitourinary: Negative for dysuria, flank pain and hematuria.  Musculoskeletal: Negative for back pain and joint pain.  Skin: Negative for rash.  Neurological: Negative for dizziness, sensory change, focal weakness, seizures, loss of consciousness, weakness and headaches.  Endo/Heme/Allergies: Negative for polydipsia.  Psychiatric/Behavioral: Negative for depression. The patient is not nervous/anxious.    DRUG ALLERGIES:  No Known Allergies VITALS:  Blood pressure (!) 157/92, pulse (!) 59, temperature (!) 97.5 F (36.4 C), temperature source Oral, resp. rate 17, height 6' (1.829 m), weight 60.5 kg, SpO2 100 %. PHYSICAL EXAMINATION:  Physical Exam Constitutional:      Appearance: Normal appearance.     Comments: Severe malnutrition  HENT:     Head: Normocephalic.     Mouth/Throat:     Mouth: Mucous membranes are moist.  Eyes:     General: No scleral icterus.    Conjunctiva/sclera: Conjunctivae normal.     Pupils: Pupils are equal, round, and reactive to light.  Neck:     Musculoskeletal: Normal range of motion and neck supple.     Vascular: No JVD.     Trachea: No tracheal deviation.   Cardiovascular:     Rate and Rhythm: Normal rate and regular rhythm.     Heart sounds: Normal heart sounds. No murmur. No gallop.   Pulmonary:     Effort: Pulmonary effort is normal. No respiratory distress.     Breath sounds: Normal breath sounds. No stridor. No wheezing, rhonchi or rales.  Abdominal:     General: Bowel sounds are normal. There is no distension.     Palpations: Abdomen is soft.     Tenderness: There is no abdominal tenderness. There is no rebound.  Musculoskeletal: Normal range of motion.        General: No tenderness.     Right lower leg: No edema.     Left lower leg: No edema.  Skin:    Findings: No erythema or rash.  Neurological:     Mental Status: He is oriented to person, place, and time.     Cranial Nerves: No cranial nerve deficit.  Psychiatric:        Mood and Affect: Mood normal.    LABORATORY PANEL:  Male CBC Recent Labs  Lab 02/09/19 0345  WBC 14.7*  HGB 15.1  HCT 44.1  PLT 236   ------------------------------------------------------------------------------------------------------------------ Chemistries  Recent Labs  Lab 02/07/19 2217  02/09/19 0345  NA 128*   < > 154*  K 6.6*   < > 4.8  CL 79*   < > 116*  CO2 <7*   < > 30  GLUCOSE 1,533*   < > 68*  BUN 69*   < > 54*  CREATININE 3.84*   < >  1.72*  CALCIUM 9.0   < > 10.0  MG  --    < > 2.9*  AST 16  --   --   ALT 22  --   --   ALKPHOS 115  --   --   BILITOT 2.2*  --   --    < > = values in this interval not displayed.   RADIOLOGY:  No results found. ASSESSMENT AND PLAN:   This is a 58 year old male admitted for DKA. 1.  DKA:  Improved with insulin drip and IV fluid support. Diabetes 1 is hyperglycemia.  Increase Levemir to 20 units daily, increase to moderate sliding scale and continue NovoLog 5 units AC.  Acute metabolic encephalopathy due to above, improved.  2.  Acute kidney injury: Secondary to dehydration; improving with intravenous fluid.  Nephrotoxic agents.  Follow up BMP.   3.  Hypertension: Less than optimal; continue amlodipine.  Labetalol as needed. 4.  Tobacco abuse: Smoking cessation was counseled for 3 to 4 minutes, NicoDerm patch. 5.  Hyperkalemia: improved. Hypokalemia.  Improved with potassium supplement. Hypernatremia.  Change to 1/2NS iv. Follow up Na. 6.    Severe malnutrition. Follow dietitian's recommendation.  All the records are reviewed and case discussed with Care Management/Social Worker. Management plans discussed with the patient, family and they are in agreement.  CODE STATUS: Full Code  TOTAL TIME TAKING CARE OF THIS PATIENT: 35 minutes.   More than 50% of the time was spent in counseling/coordination of care: YES  POSSIBLE D/C IN 2 DAYS, DEPENDING ON CLINICAL CONDITION.   Shaune Pollack M.D on 02/09/2019 at 1:36 PM  Between 7am to 6pm - Pager - [phone]  After 6pm go to [email or web address] - Therapist, nutritional Hospitalists

## 2019-02-10 LAB — CBC
HCT: 38.8 % — ABNORMAL LOW (ref 39.0–52.0)
Hemoglobin: 13 g/dL (ref 13.0–17.0)
MCH: 31.1 pg (ref 26.0–34.0)
MCHC: 33.5 g/dL (ref 30.0–36.0)
MCV: 92.8 fL (ref 80.0–100.0)
Platelets: 169 10*3/uL (ref 150–400)
RBC: 4.18 MIL/uL — ABNORMAL LOW (ref 4.22–5.81)
RDW: 13.3 % (ref 11.5–15.5)
WBC: 7.4 10*3/uL (ref 4.0–10.5)
nRBC: 0 % (ref 0.0–0.2)

## 2019-02-10 LAB — GLUCOSE, CAPILLARY
Glucose-Capillary: >600 mg/dL (ref 70–99)
Glucose-Capillary: 99 mg/dL (ref 70–99)
Glucose-Capillary: 210 mg/dL — ABNORMAL HIGH (ref 70–99)
Glucose-Capillary: 415 mg/dL — ABNORMAL HIGH (ref 70–99)
Glucose-Capillary: 277 mg/dL — ABNORMAL HIGH (ref 70–99)
Glucose-Capillary: 253 mg/dL — ABNORMAL HIGH (ref 70–99)
Glucose-Capillary: 193 mg/dL — ABNORMAL HIGH (ref 70–99)
Glucose-Capillary: 227 mg/dL — ABNORMAL HIGH (ref 70–99)
Glucose-Capillary: 290 mg/dL — ABNORMAL HIGH (ref 70–99)
Glucose-Capillary: 335 mg/dL — ABNORMAL HIGH (ref 70–99)
Glucose-Capillary: 233 mg/dL — ABNORMAL HIGH (ref 70–99)

## 2019-02-10 LAB — BASIC METABOLIC PANEL
Anion gap: 7 (ref 5–15)
BUN: 32 mg/dL — ABNORMAL HIGH (ref 6–20)
CO2: 27 mmol/L (ref 22–32)
Calcium: 8.7 mg/dL — ABNORMAL LOW (ref 8.9–10.3)
Chloride: 111 mmol/L (ref 98–111)
Creatinine, Ser: 1.07 mg/dL (ref 0.61–1.24)
GFR calc Af Amer: >60 mL/min (ref >60)
GFR calc non Af Amer: >60 mL/min (ref >60)
Glucose, Bld: 67 mg/dL — ABNORMAL LOW (ref 70–99)
Potassium: 3.2 mmol/L — ABNORMAL LOW (ref 3.5–5.1)
Sodium: 145 mmol/L (ref 135–145)

## 2019-02-10 LAB — MAGNESIUM: Magnesium: 2.6 mg/dL — ABNORMAL HIGH (ref 1.7–2.4)

## 2019-02-10 MED ORDER — ENSURE MAX PROTEIN PO LIQD
11.0000 [oz_av] | Freq: Two times a day (BID) | ORAL | Status: DC
  Administered 2019-02-10 – 2019-02-13 (×7): 11 [oz_av] via ORAL
  Filled 2019-02-10: qty 330

## 2019-02-10 MED ORDER — POTASSIUM CHLORIDE CRYS ER 20 MEQ PO TBCR
40.0000 meq | EXTENDED_RELEASE_TABLET | Freq: Once | ORAL | Status: AC
  Administered 2019-02-10: 06:00:00 40 meq via ORAL
  Filled 2019-02-10: qty 2

## 2019-02-10 MED ORDER — ADULT MULTIVITAMIN W/MINERALS CH
1.0000 | ORAL_TABLET | Freq: Every day | ORAL | Status: DC
  Administered 2019-02-10 – 2019-02-13 (×4): 1 via ORAL
  Filled 2019-02-10 (×4): qty 1

## 2019-02-10 NOTE — TOC Initial Note (Signed)
Transition of Care [facility]) - Initial/Assessment Note    Patient Details  Name: Spencer [name] MRN: [id number] Date of Birth: 31-Jul-1961  Transition of Care [facility]) CM/SW Contact:    Chapman Fitch, RN Phone Number: 02/10/2019, 11:54 AM  Clinical Narrative:                 Patient admitted for DKA.  Patient does not provide information on where he was residing prior to admission.  Patient states he will be staying at a boarding house when he discharges.  "patient states, [employer] already paid them for the month".   Patient confirms he has a glucometer and testing supplies.  Patient states he does not have any issues with transportation, but may have an issues in August "when the registration is due on the care"  RNCM reached out to [facility] and confirmed that patient has full Medicaid benefits that never expire.  Patient is assigned to [facility] for his PCP  RNCM follow up with patient related to "issues with medicaid" and obtaining medications. Patient states "there was no issue with medicaid,  I just didn't have the $20 to get my medicine".  Patient confirms that since then he has obtained all of his medication and has them for discharge.   Diabetes coordinator has recommended for Lantus to be proscribed at discharge.    Expected Discharge Plan: Home/Self Care Barriers to Discharge: Continued Medical Work up   Patient Goals and CMS Choice        Expected Discharge Plan and Services Expected Discharge Plan: Home/Self Care   Discharge Planning Services: CM Consult   Living arrangements for the past 2 months: Boarding House                          Prior Living Arrangements/Services Living arrangements for the past 2 months: [employer] Lives with:: Self Patient language and need for interpreter reviewed:: No Do you feel safe going back to the place where you live?: Yes      Need for Family Participation in Patient Care: No (Comment) Care giver support system in  place?: No (comment)      Activities of Daily Living Home Assistive Devices/Equipment: None ADL Screening (condition at time of admission) Patient's cognitive ability adequate to safely complete daily activities?: Yes Is the patient deaf or have difficulty hearing?: Yes Does the patient have difficulty seeing, even when wearing glasses/contacts?: No Does the patient have difficulty concentrating, remembering, or making decisions?: No Patient able to express need for assistance with ADLs?: Yes Does the patient have difficulty dressing or bathing?: No Independently performs ADLs?: Yes (appropriate for developmental age) Does the patient have difficulty walking or climbing stairs?: No Weakness of Legs: None Weakness of Arms/Hands: None  Permission Sought/Granted                  Emotional Assessment Appearance:: Appears stated age     Orientation: : Oriented to Self, Oriented to Place, Oriented to  Time, Oriented to Situation Alcohol / Substance Use: Illicit Drugs    Admission diagnosis:  Cough [R05] Hyperkalemia [E87.5] Confusion [R41.0] Acute renal failure, unspecified acute renal failure type (HCC) [N17.9] Diabetic ketoacidosis without coma associated with type 2 diabetes mellitus (HCC) [E11.10] Patient Active Problem List   Diagnosis Date Noted  . Hypoglycemia 10/23/2018  . Chronic low back pain 01/16/2017  . Chronic neck pain 01/16/2017  . Diabetic neuropathy (HCC) 01/16/2017  . DM2 (diabetes mellitus,  type 2) (HCC) 01/16/2017  . History of cocaine abuse (HCC) 01/16/2017  . Personal history of subdural hematoma 01/16/2017  . Closed displaced fracture of body of left calcaneus with delayed healing 12/13/2016  . Protein-calorie malnutrition, severe 11/08/2016  . DKA, type 2 (HCC) 11/06/2016  . Diabetic ketoacidosis (HCC) 04/03/2015  . DKA (diabetic ketoacidoses) (HCC) 04/03/2015  . Hypertension 04/03/2015  . Depression 04/03/2015  . Opiate dependence (HCC)  04/03/2015  . Tobacco abuse 04/03/2015  . Lumbosacral neuritis 07/19/2014  . Pain of finger of right hand 07/19/2014  . Type II or unspecified type diabetes mellitus without mention of complication, not stated as uncontrolled 07/19/2014  . Knee pain 09/12/2013  . Hernia of flank 09/20/2012  . Epidermoid cyst of skin 09/20/2012  . Chronic pain of both shoulders 03/27/2012   PCP:  [name], MD Pharmacy:   [facility] [phone] [place], [place] - [address] AT [facility] [address] [place] [address] Phone: [phone] Fax: [phone]     Social Determinants of Health (SDOH) Interventions    Readmission Risk Interventions Readmission Risk Prevention Plan 02/10/2019  Transportation Screening Complete  HRI or Home Care Consult Not Complete  SW Recovery Care/Counseling Consult Not Complete  SW Consult Not Complete Comments not indicated  Palliative Care Screening Not Applicable  Skilled Nursing Facility Not Applicable  Some recent data might be hidden

## 2019-02-10 NOTE — Progress Notes (Addendum)
Inpatient Diabetes Program Recommendations  AACE/ADA: New Consensus Statement on Inpatient Glycemic Control  Target Ranges:  Prepandial:   less than 140 mg/dL      Peak postprandial:   less than 180 mg/dL (1-2 hours)      Critically ill patients:  140 - 180 mg/dL   Results for Spencer [name], Spencer [name] (MRN [id number]) as of 02/10/2019 07:41  Ref. Range 02/08/2019 18:25 02/08/2019 22:27 02/09/2019 12:05 02/09/2019 15:55 02/09/2019 21:15 02/10/2019 07:14  Glucose-Capillary Latest Ref Range: 70 - 99 mg/dL 740 (H) 814 (H) 481 (HH) 308 (H) 270 (H) 290 (H)   Results for Spencer [name], Spencer [name] (MRN [id number]) as of 02/10/2019 07:41  Ref. Range 10/25/2018 14:18 01/06/2019 17:30  Hemoglobin A1C Latest Ref Range: 4.8 - 5.6 % 9.9 (H) 11.3 (H)   Review of Glycemic Control  Diabetes history: DM2 Outpatient Diabetes medications: Tresiba 20 units daily, Novolog 5 units TID with meals Current orders for Inpatient glycemic control: Levemir 20 units Q24H, Novolog 0-15 units TID with meals, Novolog 0-5 units QHS, Novolog 5 units TID with meals  Inpatient Diabetes Program Recommendations:   Insulin - Basal: Noted Lantus increased from 16 to 20 units daily today.  Insulin - Meal Coverage: Please consider increasing meal coverage to Novolog 7 units TID with meals if patient eats at least 50% of meals.  HgbA1C: A1C 11/3% on 01/06/19. Diabetes Coordinator spoke with patient at lenght during last hospital admission on 01/07/19 and Diabetes Coordinator spoke with patient over the phone on 02/08/19 and patient reported being out of insulin for 1 month and noted an issue with Medicaid.  CM consulted to determine if Mediciad active. Patient was given prescriptions for Evaristo Bury and Novolog at time of discharge on 01/08/19. In reviewing list for [place] Medicaid preferred insulins, noted Evaristo Bury is NOT preferred basal insulin. Therefore, please provide Rx for Lantus at time of discharge.  NOTE: Per chart, patient has [employer], however, patient reported to  Diabetes Coordinator on 02/08/19 that there was an issue with Medicaid.  If patient does indeed have Medicaid, copays for prescriptions should be $4. Noted patient was taking Tresiba insulin per home medication list and Evaristo Bury is not a preferred insulin for Medicaid. Therefore, patient will need to be prescribed a different basal insulin that is preferred under Medicaid if it is still active. Therefore, please provide Rx for Lantus at time of discharge. Patient would still need to pay his copay for prescription.  Of note, patient is cocaine positive this admission and per chart, cocaine positive during past 3 hospital admissions over the past 1 month (on 01/06/19, 01/17/19, and 02/08/19).    Thanks, Orlando Penner, RN, MSN, CDE Diabetes Coordinator Inpatient Diabetes Program [phone] (Team Pager from 8am to 5pm)

## 2019-02-10 NOTE — Progress Notes (Signed)
Initial Nutrition Assessment  RD working remotely.  DOCUMENTATION CODES:   Underweight  INTERVENTION:  Will update HealthTouch to reflect patient can have double protein portions.  Provide Ensure Max Protein po BID, each supplement provides 150 kcal and 30 grams of protein.  Provide MVI daily.  Discussed homemade ONS patient can make to drink at home since he cannot afford Ensure/Boost/Glucerna.  Encouraged adequate intake of protein at meals.  NUTRITION DIAGNOSIS:   Inadequate oral intake related to acute illness(DKA, N/V) as evidenced by per patient/family report.  GOAL:   Patient will meet greater than or equal to 90% of their needs  MONITOR:   PO intake, Supplement acceptance, Labs, Weight trends, I & O's  REASON FOR ASSESSMENT:   Consult Assessment of nutrition requirement/status  ASSESSMENT:   58 year old male with DM type 1, HTN, substance abuse, hx hernia s/p repair, hx brain aneurysm admitted with DKA, acute metabolic encephalopathy, AKI.   Spoke with patient over the phone to obtain nutrition/weight history. Patient reports he has had a decreased appetite for about a week now related to his DKA and N/V. His appetite is much better now and has almost returned to normal. He was able to finish 100% of his dinner last night and he feels he is not being sent enough food as he is still hungry after his trays. Discussed we can provide double protein portions. He reports at baseline he typically has a good appetite but endorses he has been losing weight still. He is amenable to drinking ONS but cannot afford supplements at home.  UBW was 241 lbs (109.5 kg). Did not see a weight that high for the past 3 years. Per chart patient was 72.6 kg on 10/25/2018. He is currently 60.5 kg (133.38 lbs). He has lost 12.1 kg (16.7% body weight) over 3.5 months, which is significant for time frame. Patient was unaware how little he weighed. He did know he had been losing weight since he  first started experiencing DKA.  Of note patient was just recently seen by an RD on 01/17/2019. At that time he had severe fat and muscle depletions on NFPE and met criteria for severe chronic malnutrition. RD suspects patient still meets criteria for severe chronic malnutrition, but unable to determine without NFPE.  Medications reviewed and include: gabapentin, Novolog 0-15 units TID, Novolog 0-5 units QHS, Novolog 5 units TID with meals, Levemir 20 units daily, nicotine patch, 1/2NS @ 100 mL/hr.  Labs reviewed: CBG 266-518, Potassium 3.2, BUN 32.  NUTRITION - FOCUSED PHYSICAL EXAM:  Unable to complete at this time.  Diet Order:   Diet Order            Diet Carb Modified Fluid consistency: Thin; Room service appropriate? Yes  Diet effective now             EDUCATION NEEDS:   Education needs have been addressed  Skin:  Skin Assessment: Reviewed RN Assessment(ecchymosis)  Last BM:  02/09/2019 per chart  Height:   Ht Readings from Last 1 Encounters:  02/08/19 6' (1.829 m)   Weight:   Wt Readings from Last 1 Encounters:  02/08/19 60.5 kg   Ideal Body Weight:  80.9 kg  BMI:  Body mass index is 18.09 kg/m.  Estimated Nutritional Needs:   Kcal:  1900-2200  Protein:  95-105 grams  Fluid:  1.9 L/day  Willey Blade, MS, RD, LDN Office: [phone] Pager: [phone] After Hours/Weekend Pager: [phone]

## 2019-02-10 NOTE — Progress Notes (Signed)
[facility] - [place] at [facility]   PATIENT NAME: Spencer [name]    MR#:  [id number]  DATE OF BIRTH:  [birth year]  SUBJECTIVE:  CHIEF COMPLAINT:   Chief Complaint  Patient presents with  . Altered Mental Status  . Hyperglycemia   The patient feels generalized weakness.  Blood glucose up to 375. REVIEW OF SYSTEMS:  Review of Systems  Constitutional: Positive for malaise/fatigue. Negative for chills and fever.  HENT: Negative for sore throat.   Eyes: Negative for blurred vision and double vision.  Respiratory: Negative for cough, hemoptysis, shortness of breath, wheezing and stridor.   Cardiovascular: Negative for chest pain, palpitations, orthopnea and leg swelling.  Gastrointestinal: Negative for abdominal pain, blood in stool, diarrhea, melena, nausea and vomiting.  Genitourinary: Negative for dysuria, flank pain and hematuria.  Musculoskeletal: Negative for back pain and joint pain.  Skin: Negative for rash.  Neurological: Negative for dizziness, sensory change, focal weakness, seizures, loss of consciousness, weakness and headaches.  Endo/Heme/Allergies: Negative for polydipsia.  Psychiatric/Behavioral: Negative for depression. The patient is not nervous/anxious.    DRUG ALLERGIES:  No Known Allergies VITALS:  Blood pressure 113/70, pulse 68, temperature 98.3 F (36.8 C), temperature source Oral, resp. rate 14, height 6' (1.829 m), weight 60.5 kg, SpO2 99 %. PHYSICAL EXAMINATION:  Physical Exam Constitutional:      Appearance: Normal appearance.     Comments: Underweight.  HENT:     Head: Normocephalic.     Mouth/Throat:     Mouth: Mucous membranes are moist.  Eyes:     General: No scleral icterus.    Conjunctiva/sclera: Conjunctivae normal.     Pupils: Pupils are equal, round, and reactive to light.  Neck:     Musculoskeletal: Normal range of motion and neck supple.     Vascular: No JVD.     Trachea: No tracheal deviation.  Cardiovascular:   Rate and Rhythm: Normal rate and regular rhythm.     Heart sounds: Normal heart sounds. No murmur. No gallop.   Pulmonary:     Effort: Pulmonary effort is normal. No respiratory distress.     Breath sounds: Normal breath sounds. No stridor. No wheezing, rhonchi or rales.  Abdominal:     General: Bowel sounds are normal. There is no distension.     Palpations: Abdomen is soft.     Tenderness: There is no abdominal tenderness. There is no rebound.  Musculoskeletal: Normal range of motion.        General: No tenderness.     Right lower leg: No edema.     Left lower leg: No edema.  Skin:    Findings: No erythema or rash.  Neurological:     Mental Status: He is oriented to person, place, and time.     Cranial Nerves: No cranial nerve deficit.  Psychiatric:        Mood and Affect: Mood normal.    LABORATORY PANEL:  Male CBC Recent Labs  Lab 02/10/19 0351  WBC 7.4  HGB 13.0  HCT 38.8*  PLT 169   ------------------------------------------------------------------------------------------------------------------ Chemistries  Recent Labs  Lab 02/07/19 2217  02/10/19 0351  NA 128*   < > 145  K 6.6*   < > 3.2*  CL 79*   < > 111  CO2 <7*   < > 27  GLUCOSE 1,533*   < > 67*  BUN 69*   < > 32*  CREATININE 3.84*   < > 1.07  CALCIUM 9.0   < >  8.7*  MG  --    < > 2.6*  AST 16  --   --   ALT 22  --   --   ALKPHOS 115  --   --   BILITOT 2.2*  --   --    < > = values in this interval not displayed.   RADIOLOGY:  No results found. ASSESSMENT AND PLAN:   This is a 58 year old male admitted for DKA. 1.  DKA:  Improved with insulin drip and IV fluid support. Diabetes 1 is hyperglycemia.  Increased Levemir to 20 units daily, increased to moderate sliding scale and continue NovoLog 5 units AC.  Acute metabolic encephalopathy due to above, improved.  2.  Acute kidney injury: Secondary to dehydration; improved with intravenous fluid.  Nephrotoxic agents.  3.  Hypertension: Less  than optimal; continue amlodipine.  Labetalol as needed. 4.  Tobacco abuse: Smoking cessation was counseled for 3 to 4 minutes, NicoDerm patch. 5.  Hyperkalemia: improved. Hypokalemia.  potassium supplement. Hypernatremia.  Improving with 1/2NS iv. Follow up Na. 6.    Underweight. Follow dietitian's recommendation.  All the records are reviewed and case discussed with Care Management/Social Worker. Management plans discussed with the patient, family and they are in agreement.  CODE STATUS: Full Code  TOTAL TIME TAKING CARE OF THIS PATIENT: 28 minutes.   More than 50% of the time was spent in counseling/coordination of care: YES  POSSIBLE D/C IN 2 DAYS, DEPENDING ON CLINICAL CONDITION.   Shaune Pollack M.D on 02/10/2019 at 3:44 PM  Between 7am to 6pm - Pager - [phone]  After 6pm go to [email or web address] - Therapist, nutritional Hospitalists

## 2019-02-10 NOTE — Progress Notes (Signed)
PHARMACY CONSULT NOTE   Pharmacy Consult for Electrolyte Monitoring and Replacement   Recent Labs: Potassium (mmol/L)  Date Value  02/10/2019 3.2 (L)   Magnesium (mg/dL)  Date Value  30/03/1101 2.6 (H)   Calcium (mg/dL)  Date Value  09/22/3566 8.7 (L)   Albumin (g/dL)  Date Value  01/41/0301 4.5   Phosphorus (mg/dL)  Date Value  31/43/8887 2.8   Sodium (mmol/L)  Date Value  02/10/2019 145     Assessment: 58 yo male admitted with acute encephalopathy and DKA requiring insulin drip in CCU. Patient transitioned to levemir 4/4. Pharmacy has been consulted for electrolyte monitoring and replacement.   Goal of Therapy:  K = 4.0 Mag = 2.0  Plan:  04/06 AM: K 3.2. Will replace with KCL x 1 dose.   No additional replacement needed at this time. Pharmacy will continue to monitor and replace as needed.    Gardner Candle, PharmD, BCPS Clinical Pharmacist 02/10/2019 5:52 AM

## 2019-02-11 LAB — BASIC METABOLIC PANEL
Anion gap: 8 (ref 5–15)
BUN: 32 mg/dL — ABNORMAL HIGH (ref 6–20)
CO2: 24 mmol/L (ref 22–32)
Calcium: 8.8 mg/dL — ABNORMAL LOW (ref 8.9–10.3)
Chloride: 107 mmol/L (ref 98–111)
Creatinine, Ser: 0.89 mg/dL (ref 0.61–1.24)
GFR calc Af Amer: >60 mL/min (ref >60)
GFR calc non Af Amer: >60 mL/min (ref >60)
Glucose, Bld: 383 mg/dL — ABNORMAL HIGH (ref 70–99)
Potassium: 4.5 mmol/L (ref 3.5–5.1)
Sodium: 139 mmol/L (ref 135–145)

## 2019-02-11 LAB — GLUCOSE, CAPILLARY
Glucose-Capillary: 533 mg/dL (ref 70–99)
Glucose-Capillary: 330 mg/dL — ABNORMAL HIGH (ref 70–99)
Glucose-Capillary: 403 mg/dL — ABNORMAL HIGH (ref 70–99)
Glucose-Capillary: 337 mg/dL — ABNORMAL HIGH (ref 70–99)
Glucose-Capillary: 49 mg/dL — ABNORMAL LOW (ref 70–99)
Glucose-Capillary: 124 mg/dL — ABNORMAL HIGH (ref 70–99)
Glucose-Capillary: 65 mg/dL — ABNORMAL LOW (ref 70–99)

## 2019-02-11 MED ORDER — INSULIN DETEMIR 100 UNIT/ML ~~LOC~~ SOLN
25.0000 [IU] | SUBCUTANEOUS | Status: DC
  Filled 2019-02-11: qty 0.25

## 2019-02-11 MED ORDER — INSULIN REGULAR HUMAN 100 UNIT/ML IJ SOLN
5.0000 [IU] | Freq: Once | INTRAMUSCULAR | Status: AC
  Administered 2019-02-11: 5 [IU] via INTRAVENOUS
  Filled 2019-02-11: qty 10

## 2019-02-11 MED ORDER — INSULIN ASPART 100 UNIT/ML ~~LOC~~ SOLN
8.0000 [IU] | Freq: Three times a day (TID) | SUBCUTANEOUS | Status: DC
  Administered 2019-02-12 – 2019-02-13 (×4): 8 [IU] via SUBCUTANEOUS
  Filled 2019-02-11 (×4): qty 1

## 2019-02-11 MED ORDER — INSULIN ASPART 100 UNIT/ML ~~LOC~~ SOLN
18.0000 [IU] | Freq: Once | SUBCUTANEOUS | Status: AC
  Administered 2019-02-11: 18 [IU] via SUBCUTANEOUS
  Filled 2019-02-11: qty 1

## 2019-02-11 MED ORDER — INSULIN DETEMIR 100 UNIT/ML ~~LOC~~ SOLN
24.0000 [IU] | SUBCUTANEOUS | Status: DC
  Administered 2019-02-11: 24 [IU] via SUBCUTANEOUS
  Filled 2019-02-11 (×2): qty 0.24

## 2019-02-11 MED ORDER — INSULIN DETEMIR 100 UNIT/ML ~~LOC~~ SOLN
22.0000 [IU] | SUBCUTANEOUS | Status: DC
  Filled 2019-02-11: qty 0.22

## 2019-02-11 MED ORDER — INSULIN ASPART 100 UNIT/ML ~~LOC~~ SOLN
10.0000 [IU] | Freq: Three times a day (TID) | SUBCUTANEOUS | Status: DC
  Administered 2019-02-11: 10 [IU] via SUBCUTANEOUS
  Filled 2019-02-11: qty 1

## 2019-02-11 MED ORDER — DOCUSATE SODIUM 100 MG PO CAPS
200.0000 mg | ORAL_CAPSULE | Freq: Two times a day (BID) | ORAL | Status: DC | PRN
  Administered 2019-02-11: 200 mg via ORAL
  Filled 2019-02-11: qty 2

## 2019-02-11 NOTE — Progress Notes (Signed)
MD notified: Dr. Royston Bake just going to hold the correctional insulin of 10 units and the meal coverage insulin for now. I will recheck his blood sugar in fifteen minutes. Another orange juice was provided and his food tray has arrived.

## 2019-02-11 NOTE — Progress Notes (Signed)
Hypoglycemic Event  CBG: 49  Treatment: 4 oz juice/soda  Symptoms: None  Follow-up CBG: Time:1648 CBG Result:65  Possible Reasons for Event: Medication  Comments/MD notified:Dr. Yehuda Savannah

## 2019-02-11 NOTE — Progress Notes (Signed)
PHARMACY CONSULT NOTE   Pharmacy Consult for Electrolyte Monitoring and Replacement   Recent Labs: Potassium (mmol/L)  Date Value  02/11/2019 4.5   Magnesium (mg/dL)  Date Value  16/08/9603 2.6 (H)   Calcium (mg/dL)  Date Value  54/07/8118 8.8 (L)   Albumin (g/dL)  Date Value  14/78/2956 4.5   Phosphorus (mg/dL)  Date Value  21/30/8657 2.8   Sodium (mmol/L)  Date Value  02/11/2019 139     Assessment: 58 yo male admitted with acute encephalopathy and DKA requiring insulin drip in CCU. Patient transitioned to levemir 4/4. Pharmacy has been consulted for electrolyte monitoring and replacement.   Goal of Therapy:  K = 4.0 Mag = 2.0  Plan:  04/07 AM: K 4.5  No replacement needed at this time. Pharmacy will continue to monitor and replace as needed.    Angelique Blonder, PharmD, BCPS Clinical Pharmacist 02/11/2019 7:49 AM

## 2019-02-11 NOTE — Progress Notes (Signed)
PT blood glucose 403 this am. MD Imogene Burn paged and received verbal order for 18 units novolog one time dose plus meal coverage 5 units.

## 2019-02-11 NOTE — Progress Notes (Signed)
Inpatient Diabetes Program Recommendations  AACE/ADA: New Consensus Statement on Inpatient Glycemic Control  Target Ranges:  Prepandial:   less than 140 mg/dL      Peak postprandial:   less than 180 mg/dL (1-2 hours)      Critically ill patients:  140 - 180 mg/dL   Results for ROHUN, HENSCH (MRN [id number]) as of 02/11/2019 07:50  Ref. Range 02/10/2019 11:38 02/10/2019 21:16 02/11/2019 07:22  Glucose-Capillary Latest Ref Range: 70 - 99 mg/dL 938 (H) 101 (H) 751 (H)   Review of Glycemic Control  Diabetes history: DM2 Outpatient Diabetes medications: Tresiba 20 units daily, Novolog 5 units TID with meals Current orders for Inpatient glycemic control: Levemir 20 units Q24H, Novolog 0-15 units TID with meals, Novolog 0-5 units QHS, Novolog 5 units TID with meals  Inpatient Diabetes Program Recommendations:   Insulin - Basal: Fasting glucose 403 mg/dl this morning. Please discontinue Levemir and use Lantus for basal insulin as Levemir may not be lasting for a full 24 hours. Please order Lantus 24 units Q24H starting this morning.  Insulin - Meal Coverage: Please consider increasing meal coverage to Novolog 10 units TID with meals if patient eats at least 50% of meals.  Thanks, Orlando Penner, RN, MSN, CDE Diabetes Coordinator Inpatient Diabetes Program [phone] (Team Pager from 8am to 5pm)

## 2019-02-11 NOTE — Progress Notes (Signed)
Hypoglycemic Event  CBG: 124   Treatment: 4 oz juice/soda  Symptoms: None  Follow-up CBG: Time:1719 CBG Result:124   Possible Reasons for Event: Medication regimen: insulin  Comments/MD notified:Dr. Althea Charon

## 2019-02-11 NOTE — Discharge Instructions (Signed)
Smoking cessation  

## 2019-02-11 NOTE — Progress Notes (Signed)
[facility] - [place] at [facility]   PATIENT NAME: Spencer SpireRoger Fontanilla    MR#:  [id number]  DATE OF BIRTH:  12/26/1960  SUBJECTIVE:  CHIEF COMPLAINT:   Chief Complaint  Patient presents with  . Altered Mental Status  . Hyperglycemia   The patient feels better.  Blood glucose up to 403 this am, but down to 49 just now. He is given orange juice, BS up to 65. REVIEW OF SYSTEMS:  Review of Systems  Constitutional: Positive for malaise/fatigue. Negative for chills and fever.  HENT: Negative for sore throat.   Eyes: Negative for blurred vision and double vision.  Respiratory: Negative for cough, hemoptysis, shortness of breath, wheezing and stridor.   Cardiovascular: Negative for chest pain, palpitations, orthopnea and leg swelling.  Gastrointestinal: Negative for abdominal pain, blood in stool, diarrhea, melena, nausea and vomiting.  Genitourinary: Negative for dysuria, flank pain and hematuria.  Musculoskeletal: Negative for back pain and joint pain.  Skin: Negative for rash.  Neurological: Negative for dizziness, sensory change, focal weakness, seizures, loss of consciousness, weakness and headaches.  Endo/Heme/Allergies: Negative for polydipsia.  Psychiatric/Behavioral: Negative for depression. The patient is not nervous/anxious.    DRUG ALLERGIES:  No Known Allergies VITALS:  Blood pressure 127/82, pulse 63, temperature 97.8 F (36.6 C), temperature source Oral, resp. rate 18, height 6' (1.829 m), weight 60.5 kg, SpO2 100 %. PHYSICAL EXAMINATION:  Physical Exam Constitutional:      Appearance: Normal appearance.     Comments: Underweight.  HENT:     Head: Normocephalic.     Mouth/Throat:     Mouth: Mucous membranes are moist.  Eyes:     General: No scleral icterus.    Conjunctiva/sclera: Conjunctivae normal.     Pupils: Pupils are equal, round, and reactive to light.  Neck:     Musculoskeletal: Normal range of motion and neck supple.     Vascular: No JVD.      Trachea: No tracheal deviation.  Cardiovascular:     Rate and Rhythm: Normal rate and regular rhythm.     Heart sounds: Normal heart sounds. No murmur. No gallop.   Pulmonary:     Effort: Pulmonary effort is normal. No respiratory distress.     Breath sounds: Normal breath sounds. No stridor. No wheezing, rhonchi or rales.  Abdominal:     General: Bowel sounds are normal. There is no distension.     Palpations: Abdomen is soft.     Tenderness: There is no abdominal tenderness. There is no rebound.  Musculoskeletal: Normal range of motion.        General: No tenderness.     Right lower leg: No edema.     Left lower leg: No edema.  Skin:    Findings: No erythema or rash.  Neurological:     Mental Status: He is oriented to person, place, and time.     Cranial Nerves: No cranial nerve deficit.  Psychiatric:        Mood and Affect: Mood normal.    LABORATORY PANEL:  Male CBC Recent Labs  Lab 02/10/19 0351  WBC 7.4  HGB 13.0  HCT 38.8*  PLT 169   ------------------------------------------------------------------------------------------------------------------ Chemistries  Recent Labs  Lab 02/07/19 2217  02/10/19 0351 02/11/19 0328  NA 128*   < > 145 139  K 6.6*   < > 3.2* 4.5  CL 79*   < > 111 107  CO2 <7*   < > 27 24  GLUCOSE 1,533*   < >  67* 383*  BUN 69*   < > 32* 32*  CREATININE 3.84*   < > 1.07 0.89  CALCIUM 9.0   < > 8.7* 8.8*  MG  --    < > 2.6*  --   AST 16  --   --   --   ALT 22  --   --   --   ALKPHOS 115  --   --   --   BILITOT 2.2*  --   --   --    < > = values in this interval not displayed.   RADIOLOGY:  No results found. ASSESSMENT AND PLAN:   This is a 58 year old male admitted for DKA. 1.  DKA:  Improved with insulin drip and IV fluid support. Diabetes 1 is hyperglycemia and hypoglycemia.  Due to high BS, Levemir is increased to 24 units daily and NovoLog is increased to 10 units AC. But BS down to 49 just now. Decrease Levemir to 22 and  Novolog to 8 units AC.  Acute metabolic encephalopathy due to above, improved.  2.  Acute kidney injury: Secondary to dehydration; improved with intravenous fluid.  Nephrotoxic agents.  3.  Hypertension: continue amlodipine.  Labetalol as needed. 4.  Tobacco abuse: Smoking cessation was counseled for 3 to 4 minutes, NicoDerm patch. 5.  Hyperkalemia: improved. Hypokalemia.  Improved with potassium supplement. Hypernatremia.  Improved with 1/2NS iv. Follow up Na. 6.    Underweight. Follow dietitian's recommendation.  All the records are reviewed and case discussed with Care Management/Social Worker. Management plans discussed with the patient, family and they are in agreement.  CODE STATUS: Full Code  TOTAL TIME TAKING CARE OF THIS PATIENT: 29 minutes.   More than 50% of the time was spent in counseling/coordination of care: YES  POSSIBLE D/C IN 2 DAYS, DEPENDING ON CLINICAL CONDITION.   Shaune Pollack M.D on 02/11/2019 at 5:08 PM  Between 7am to 6pm - Pager - [phone]  After 6pm go to [email or web address] - Therapist, nutritional Hospitalists

## 2019-02-12 LAB — BASIC METABOLIC PANEL
Anion gap: 9 (ref 5–15)
BUN: 35 mg/dL — ABNORMAL HIGH (ref 6–20)
CO2: 23 mmol/L (ref 22–32)
Calcium: 9.1 mg/dL (ref 8.9–10.3)
Chloride: 105 mmol/L (ref 98–111)
Creatinine, Ser: 1.03 mg/dL (ref 0.61–1.24)
GFR calc Af Amer: >60 mL/min (ref >60)
GFR calc non Af Amer: >60 mL/min (ref >60)
Glucose, Bld: 253 mg/dL — ABNORMAL HIGH (ref 70–99)
Potassium: 4.1 mmol/L (ref 3.5–5.1)
Sodium: 137 mmol/L (ref 135–145)

## 2019-02-12 LAB — GLUCOSE, CAPILLARY
Glucose-Capillary: 178 mg/dL — ABNORMAL HIGH (ref 70–99)
Glucose-Capillary: 215 mg/dL — ABNORMAL HIGH (ref 70–99)
Glucose-Capillary: 226 mg/dL — ABNORMAL HIGH (ref 70–99)
Glucose-Capillary: 262 mg/dL — ABNORMAL HIGH (ref 70–99)
Glucose-Capillary: 388 mg/dL — ABNORMAL HIGH (ref 70–99)
Glucose-Capillary: 440 mg/dL — ABNORMAL HIGH (ref 70–99)
Glucose-Capillary: 52 mg/dL — ABNORMAL LOW (ref 70–99)

## 2019-02-12 MED ORDER — INSULIN ASPART 100 UNIT/ML ~~LOC~~ SOLN
0.0000 [IU] | Freq: Three times a day (TID) | SUBCUTANEOUS | Status: DC
  Administered 2019-02-12: 3 [IU] via SUBCUTANEOUS
  Administered 2019-02-13: 7 [IU] via SUBCUTANEOUS
  Filled 2019-02-12 (×2): qty 1

## 2019-02-12 MED ORDER — SODIUM CHLORIDE 0.9 % IV SOLN
INTRAVENOUS | Status: DC
  Administered 2019-02-12 – 2019-02-13 (×3): via INTRAVENOUS

## 2019-02-12 MED ORDER — INSULIN ASPART 100 UNIT/ML ~~LOC~~ SOLN: 0.0000 [IU] | Freq: Every day | SUBCUTANEOUS | Status: DC

## 2019-02-12 MED ORDER — INSULIN GLARGINE 100 UNIT/ML ~~LOC~~ SOLN
24.0000 [IU] | Freq: Every day | SUBCUTANEOUS | Status: DC
  Administered 2019-02-12 – 2019-02-13 (×2): 24 [IU] via SUBCUTANEOUS
  Filled 2019-02-12 (×2): qty 0.24

## 2019-02-12 NOTE — Progress Notes (Signed)
Inpatient Diabetes Program Recommendations  AACE/ADA: New Consensus Statement on Inpatient Glycemic Control   Target Ranges:  Prepandial:   less than 140 mg/dL      Peak postprandial:   less than 180 mg/dL (1-2 hours)      Critically ill patients:  140 - 180 mg/dL   Results for Spencer [name], Spencer [name] (MRN [id number]) as of 02/12/2019 07:03  Ref. Range 02/12/2019 02:57  Glucose Latest Ref Range: 70 - 99 mg/dL 681 (H)   Results for Spencer [name], Spencer [name] (MRN [id number]) as of 02/12/2019 07:03  Ref. Range 02/11/2019 07:22 02/11/2019 10:00 02/11/2019 11:30 02/11/2019 16:25 02/11/2019 16:48 02/11/2019 17:19 02/12/2019 00:18  Glucose-Capillary Latest Ref Range: 70 - 99 mg/dL 597 (H)  Novolog 23 units @8 :24    Levemir 24 units 337 (H)  Novolog 21 units@12 :30 49 (L) 65 (L) 124 (H) 388 (H)   Novolog 5 units@23 :34    Review of Glycemic Control Diabetes history:DM2 Outpatient Diabetes medications:Tresiba 20 units daily, Novolog 5 units TID with meals Current orders for Inpatient glycemic control:Levemir 22 units Q24H, Novolog 0-15 units TID with meals, Novolog 0-5 units QHS, Novolog 8 units TID with meals  Inpatient Diabetes Program Recommendations:   Insulin - Basal: Please discontinue Levemir as it likely is not lasting for 24 hours for patient. Please consider ordering Lantus 24 units daily (to start this morning).  Insulin-Correction: Please consider decreasing Novolog correction scale to 0-9 units TID with meals.  NOTE: Noted hyperglycemia followed by hypoglycemia yesterday. Anticipate hypoglycemia due to Novolog insulin. Recommend changing basal insulin from Levemir to Lantus and decreasing Novolog correction to sensitive scale.  Thanks, Orlando Penner, RN, MSN, CDE Diabetes Coordinator Inpatient Diabetes Program [phone] (Team Pager from 8am to 5pm)

## 2019-02-12 NOTE — Progress Notes (Signed)
CBG 440 at 2155.  Value did not transfer into computer. Showed charge nurse to verify. Dr Anne Hahn put in new order. Will continue to monitor

## 2019-02-12 NOTE — Progress Notes (Signed)
[facility] - [place] at [facility]   PATIENT NAME: Spencer [name]    MR#:  [id number]  DATE OF BIRTH:  13-Nov-1960  SUBJECTIVE:  CHIEF COMPLAINT:   Chief Complaint  Patient presents with  . Altered Mental Status  . Hyperglycemia   The patient feels better.  Blood glucose is better controlled. REVIEW OF SYSTEMS:  Review of Systems  Constitutional: Negative for chills, fever and malaise/fatigue.  HENT: Negative for sore throat.   Eyes: Negative for blurred vision and double vision.  Respiratory: Negative for cough, hemoptysis, shortness of breath, wheezing and stridor.   Cardiovascular: Negative for chest pain, palpitations, orthopnea and leg swelling.  Gastrointestinal: Negative for abdominal pain, blood in stool, diarrhea, melena, nausea and vomiting.  Genitourinary: Negative for dysuria, flank pain and hematuria.  Musculoskeletal: Negative for back pain and joint pain.  Skin: Negative for rash.  Neurological: Negative for dizziness, sensory change, focal weakness, seizures, loss of consciousness, weakness and headaches.  Endo/Heme/Allergies: Negative for polydipsia.  Psychiatric/Behavioral: Negative for depression. The patient is not nervous/anxious.    DRUG ALLERGIES:  No Known Allergies VITALS:  Blood pressure 119/78, pulse 71, temperature 97.9 F (36.6 C), temperature source Oral, resp. rate 18, height 6' (1.829 m), weight 60.5 kg, SpO2 100 %. PHYSICAL EXAMINATION:  Physical Exam Constitutional:      Appearance: Normal appearance.     Comments: Underweight.  HENT:     Head: Normocephalic.     Mouth/Throat:     Mouth: Mucous membranes are moist.  Eyes:     General: No scleral icterus.    Conjunctiva/sclera: Conjunctivae normal.     Pupils: Pupils are equal, round, and reactive to light.  Neck:     Musculoskeletal: Normal range of motion and neck supple.     Vascular: No JVD.     Trachea: No tracheal deviation.  Cardiovascular:     Rate and  Rhythm: Normal rate and regular rhythm.     Heart sounds: Normal heart sounds. No murmur. No gallop.   Pulmonary:     Effort: Pulmonary effort is normal. No respiratory distress.     Breath sounds: Normal breath sounds. No stridor. No wheezing, rhonchi or rales.  Abdominal:     General: Bowel sounds are normal. There is no distension.     Palpations: Abdomen is soft.     Tenderness: There is no abdominal tenderness. There is no rebound.  Musculoskeletal: Normal range of motion.        General: No tenderness.     Right lower leg: No edema.     Left lower leg: No edema.  Skin:    Findings: No erythema or rash.  Neurological:     Mental Status: He is oriented to person, place, and time.     Cranial Nerves: No cranial nerve deficit.  Psychiatric:        Mood and Affect: Mood normal.    LABORATORY PANEL:  Male CBC Recent Labs  Lab 02/10/19 0351  WBC 7.4  HGB 13.0  HCT 38.8*  PLT 169   ------------------------------------------------------------------------------------------------------------------ Chemistries  Recent Labs  Lab 02/07/19 2217  02/10/19 0351  02/12/19 0257  NA 128*   < > 145   < > 137  K 6.6*   < > 3.2*   < > 4.1  CL 79*   < > 111   < > 105  CO2 <7*   < > 27   < > 23  GLUCOSE 1,533*   < >  67*   < > 253*  BUN 69*   < > 32*   < > 35*  CREATININE 3.84*   < > 1.07   < > 1.03  CALCIUM 9.0   < > 8.7*   < > 9.1  MG  --    < > 2.6*  --   --   AST 16  --   --   --   --   ALT 22  --   --   --   --   ALKPHOS 115  --   --   --   --   BILITOT 2.2*  --   --   --   --    < > = values in this interval not displayed.   RADIOLOGY:  No results found. ASSESSMENT AND PLAN:   This is a 58 year old male admitted for DKA. 1.  DKA:  Improved with insulin drip and IV fluid support. Diabetes 1 is hyperglycemia and hypoglycemia.  Due to high BS, Levemir is increased to 24 units daily and NovoLog is increased to 10 units AC. But BS down to 49 just now. Decrease Levemir to  22 and Novolog to 8 units AC.  Acute metabolic encephalopathy due to above, improved.  2.  Acute kidney injury: Secondary to dehydration; continue ntravenous fluid.  Follow-up BMP. Nephrotoxic agents.  3.  Hypertension: continue amlodipine.  Labetalol as needed. 4.  Tobacco abuse: Smoking cessation was counseled for 3 to 4 minutes, NicoDerm patch. 5.  Hyperkalemia: improved. Hypokalemia.  Improved with potassium supplement. Hypernatremia.  Improved with 1/2NS iv. Follow up Na. 6.    Underweight. Follow dietitian's recommendation.  All the records are reviewed and case discussed with Care Management/Social Worker. Management plans discussed with the patient, family and they are in agreement.  CODE STATUS: Full Code  TOTAL TIME TAKING CARE OF THIS PATIENT: 26 minutes.   More than 50% of the time was spent in counseling/coordination of care: YES  POSSIBLE D/C IN 1-2 DAYS, DEPENDING ON CLINICAL CONDITION.   Shaune PollackQing Zerek Litsey M.D on 02/12/2019 at 5:11 PM  Between 7am to 6pm - Pager - [phone]  After 6pm go to [email or web address] - Therapist, nutritionalpassword [facility]AS [facility]  [facility] [place] [facility]

## 2019-02-12 NOTE — Progress Notes (Signed)
PHARMACY CONSULT NOTE   Pharmacy Consult for Electrolyte Monitoring and Replacement   Recent Labs: Potassium (mmol/L)  Date Value  02/12/2019 4.1   Magnesium (mg/dL)  Date Value  75/08/2584 2.6 (H)   Calcium (mg/dL)  Date Value  27/78/2423 9.1   Albumin (g/dL)  Date Value  53/61/4431 4.5   Phosphorus (mg/dL)  Date Value  54/00/8676 2.8   Sodium (mmol/L)  Date Value  02/12/2019 137     Assessment: 58 yo male admitted with acute encephalopathy and DKA requiring insulin drip in CCU. Patient transitioned to levemir 4/4. Pharmacy has been consulted for electrolyte monitoring and replacement.   Goal of Therapy:  K = 4.0 Mag = 2.0  Plan:  04/07 AM: K 4.1  No replacement needed at this time. Pharmacy will continue to monitor and replace as needed.    Angelique Blonder, PharmD, BCPS Clinical Pharmacist 02/12/2019 7:28 AM

## 2019-02-13 LAB — BASIC METABOLIC PANEL
Anion gap: 8 (ref 5–15)
BUN: 25 mg/dL — ABNORMAL HIGH (ref 6–20)
CO2: 23 mmol/L (ref 22–32)
Calcium: 8.9 mg/dL (ref 8.9–10.3)
Chloride: 108 mmol/L (ref 98–111)
Creatinine, Ser: 0.9 mg/dL (ref 0.61–1.24)
GFR calc Af Amer: >60 mL/min (ref >60)
GFR calc non Af Amer: >60 mL/min (ref >60)
Glucose, Bld: 325 mg/dL — ABNORMAL HIGH (ref 70–99)
Potassium: 4.3 mmol/L (ref 3.5–5.1)
Sodium: 139 mmol/L (ref 135–145)

## 2019-02-13 LAB — GLUCOSE, CAPILLARY: Glucose-Capillary: 315 mg/dL — ABNORMAL HIGH (ref 70–99)

## 2019-02-13 LAB — MAGNESIUM: Magnesium: 2.1 mg/dL (ref 1.7–2.4)

## 2019-02-13 MED ORDER — INSULIN GLARGINE 100 UNIT/ML ~~LOC~~ SOLN: 24.0000 [IU] | Freq: Every day | SUBCUTANEOUS | 2 refills | Status: DC

## 2019-02-13 MED ORDER — NICOTINE 14 MG/24HR TD PT24: 14.0000 mg | MEDICATED_PATCH | Freq: Every day | TRANSDERMAL | 0 refills | Status: DC

## 2019-02-13 MED ORDER — INSULIN GLARGINE 100 UNITS/ML SOLOSTAR PEN: 24.0000 [IU] | PEN_INJECTOR | Freq: Every day | SUBCUTANEOUS | Status: DC

## 2019-02-13 MED ORDER — INSULIN GLARGINE 100 UNITS/ML SOLOSTAR PEN: 24.0000 [IU] | PEN_INJECTOR | Freq: Every day | SUBCUTANEOUS | 2 refills | Status: DC

## 2019-02-13 NOTE — TOC Transition Note (Signed)
Transition of Care [facility]) - CM/SW Discharge Note   Patient Details  Name: JKOBE CONNIFF MRN: [id number] Date of Birth: 07-23-1961  Transition of Care [facility]) CM/SW Contact:  Chapman Fitch, RN Phone Number: 02/13/2019, 9:42 AM   Clinical Narrative:     Per assessment with patient there shouldn't be any needs at discharge.  Benefit check for Lantus pending.   Final next level of care: Home/Self Care Barriers to Discharge: Barriers Resolved   Patient Goals and CMS Choice        Discharge Placement                       Discharge Plan and Services   Discharge Planning Services: CM Consult                      Social Determinants of Health (SDOH) Interventions     Readmission Risk Interventions Readmission Risk Prevention Plan 02/13/2019 02/10/2019  Transportation Screening Complete Complete  Medication Review [facility]) Complete -  PCP or Specialist appointment within 3-5 days of discharge (No Data) -  HRI or Home Care Consult - Not Complete  SW Recovery Care/Counseling Consult - Not Complete  SW Consult Not Complete Comments - not indicated  Palliative Care Screening - Not Applicable  Skilled Nursing Facility - Not Applicable  Some recent data might be hidden

## 2019-02-13 NOTE — Discharge Summary (Signed)
[place] at [place] NAME: Spencer [name]    MR#:  [id number]  DATE OF BIRTH:  06-26-1961  DATE OF ADMISSION:  02/07/2019   ADMITTING PHYSICIAN: Harrie Foreman, MD  DATE OF DISCHARGE: 02/13/2019 12:41 PM  PRIMARY CARE PHYSICIAN: Jodi Marble, MD   ADMISSION DIAGNOSIS:  Cough [R05] Hyperkalemia [E87.5] Confusion [R41.0] Acute renal failure, unspecified acute renal failure type ([place]) [N17.9] Diabetic ketoacidosis without coma associated with type 2 diabetes mellitus ([place]) [E11.10] DISCHARGE DIAGNOSIS:  Active Problems:   DKA (diabetic ketoacidoses) ([place])  SECONDARY DIAGNOSIS:   Past Medical History:  Diagnosis Date  . Brain aneurysm   . Diabetes mellitus without complication ([place])   . Hypertension   . Substance abuse [facility])    HOSPITAL COURSE:  This is a 58 year old male admitted for DKA. 1. DKA:  Improved with insulin drip and IV fluid support. Diabetes 1 is hyperglycemia and hypoglycemia.  Change to Lantus 24 units with sliding scale and NovoLog AC.  Blood glucose is better controlled.  Follow-up PCP with adjustment of the dose.  Acute metabolic encephalopathy due to above, improved.  2. Acute kidney injury: Secondary to dehydration;  improved with ntravenous fluid.  3. Hypertension: continue amlodipine. Labetalol as needed. 4. Tobacco abuse: Smoking cessation was counseled for 3 to 4 minutes, NicoDerm patch. 5. Hyperkalemia: improved. Hypokalemia.  Improved with potassium supplement. Hypernatremia.  Improved with 1/2NS iv. Follow up Na. 6.   Underweight. Follow dietitian's recommendation. DISCHARGE CONDITIONS:  Stable, discharged to home today. CONSULTS OBTAINED:   DRUG ALLERGIES:  No Known Allergies DISCHARGE MEDICATIONS:   Allergies as of 02/13/2019   No Known Allergies     Medication List    STOP taking these medications   Tresiba FlexTouch 200 UNIT/ML Sopn Generic drug:  Insulin Degludec      TAKE these medications   amLODipine 10 MG tablet Commonly known as:  NORVASC Take 1 tablet (10 mg total) by mouth daily.   atorvastatin 10 MG tablet Commonly known as:  LIPITOR Take 1 tablet (10 mg total) by mouth daily.   blood glucose meter kit and supplies Check blood sugar 4 times daily   cyclobenzaprine 10 MG tablet Commonly known as:  FLEXERIL Take 1 tablet (10 mg total) by mouth 3 (three) times daily as needed.   gabapentin 300 MG capsule Commonly known as:  NEURONTIN Take 2 capsules (600 mg total) by mouth 3 (three) times daily.   insulin aspart 100 UNIT/ML FlexPen Commonly known as:  NOVOLOG Inject 5 Units into the skin 3 (three) times daily with meals for 30 days.   insulin glargine 100 unit/mL Sopn Commonly known as:  LANTUS Inject 0.24 mLs (24 Units total) into the skin daily.   Insulin Pen Needle 31G X 5 MM Misc Check blood sugars 4 times daily   nicotine 14 mg/24hr patch Commonly known as:  NICODERM CQ - dosed in mg/24 hours Place 1 patch (14 mg total) onto the skin daily.        DISCHARGE INSTRUCTIONS:  See AVS.  If you experience worsening of your admission symptoms, develop shortness of breath, life threatening emergency, suicidal or homicidal thoughts you must seek medical attention immediately by calling 911 or calling your MD immediately  if symptoms less severe.  You Must read complete instructions/literature along with all the possible adverse reactions/side effects for all the Medicines you take and that have been prescribed to you. Take any new Medicines after you  have completely understood and accpet all the possible adverse reactions/side effects.   Please note  You were cared for by a hospitalist during your hospital stay. If you have any questions about your discharge medications or the care you received while you were in the hospital after you are discharged, you can call the unit and asked to speak with the hospitalist on call if the  hospitalist that took care of you is not available. Once you are discharged, your primary care physician will handle any further medical issues. Please note that NO REFILLS for any discharge medications will be authorized once you are discharged, as it is imperative that you return to your primary care physician (or establish a relationship with a primary care physician if you do not have one) for your aftercare needs so that they can reassess your need for medications and monitor your lab values.    On the day of Discharge:  VITAL SIGNS:  Blood pressure 119/80, pulse 67, temperature 97.7 F (36.5 C), temperature source Oral, resp. rate 18, height 6' (1.829 m), weight 60.5 kg, SpO2 97 %. PHYSICAL EXAMINATION:  GENERAL:  58 y.o.-year-old patient lying in the bed with no acute distress.  EYES: Pupils equal, round, reactive to light and accommodation. No scleral icterus. Extraocular muscles intact.  HEENT: Head atraumatic, normocephalic. Oropharynx and nasopharynx clear.  NECK:  Supple, no jugular venous distention. No thyroid enlargement, no tenderness.  LUNGS: Normal breath sounds bilaterally, no wheezing, rales,rhonchi or crepitation. No use of accessory muscles of respiration.  CARDIOVASCULAR: S1, S2 normal. No murmurs, rubs, or gallops.  ABDOMEN: Soft, non-tender, non-distended. Bowel sounds present. No organomegaly or mass.  EXTREMITIES: No pedal edema, cyanosis, or clubbing.  NEUROLOGIC: Cranial nerves II through XII are intact. Muscle strength 5/5 in all extremities. Sensation intact. Gait not checked.  PSYCHIATRIC: The patient is alert and oriented x 3.  SKIN: No obvious rash, lesion, or ulcer.  DATA REVIEW:   CBC Recent Labs  Lab 02/10/19 0351  WBC 7.4  HGB 13.0  HCT 38.8*  PLT 169    Chemistries  Recent Labs  Lab 02/07/19 2217  02/13/19 0416  NA 128*   < > 139  K 6.6*   < > 4.3  CL 79*   < > 108  CO2 <7*   < > 23  GLUCOSE 1,533*   < > 325*  BUN 69*   < > 25*   CREATININE 3.84*   < > 0.90  CALCIUM 9.0   < > 8.9  MG  --    < > 2.1  AST 16  --   --   ALT 22  --   --   ALKPHOS 115  --   --   BILITOT 2.2*  --   --    < > = values in this interval not displayed.     Microbiology Results  Results for orders placed or performed during the hospital encounter of 02/07/19  MRSA PCR Screening     Status: None   Collection Time: 02/08/19  1:42 AM  Result Value Ref Range Status   MRSA by PCR NEGATIVE NEGATIVE Final    Comment:        The GeneXpert MRSA Assay (FDA approved for NASAL specimens only), is one component of a comprehensive MRSA colonization surveillance program. It is not intended to diagnose MRSA infection nor to guide or monitor treatment for MRSA infections. Performed at [facility], [place], [place]  [address]     RADIOLOGY:  No results found.   Management plans discussed with the patient, family and they are in agreement.  CODE STATUS: Full Code   TOTAL TIME TAKING CARE OF THIS PATIENT: 32 minutes.    Demetrios Loll M.D on 02/13/2019 at 1:33 PM  Between 7am to 6pm - Pager - [phone]  After 6pm go to [email or web address] - Proofreader  [facility] [place] [facility]  Office  [phone]  CC: Primary care physician; Jodi Marble, MD   Note: This dictation was prepared with Dragon dictation along with smaller phrase technology. Any transcriptional errors that result from this process are unintentional.

## 2019-02-13 NOTE — Progress Notes (Signed)
PHARMACY CONSULT NOTE   Pharmacy Consult for Electrolyte Monitoring and Replacement   Recent Labs: Potassium (mmol/L)  Date Value  02/13/2019 4.3   Magnesium (mg/dL)  Date Value  95/63/8756 2.1   Calcium (mg/dL)  Date Value  43/32/9518 8.9   Albumin (g/dL)  Date Value  84/16/6063 4.5   Phosphorus (mg/dL)  Date Value  01/60/1093 2.8   Sodium (mmol/L)  Date Value  02/13/2019 139     Assessment: 58 yo male admitted with acute encephalopathy and DKA requiring insulin drip in CCU. Patient transitioned to levemir 4/4. Pharmacy has been consulted for electrolyte monitoring and replacement.   Goal of Therapy:  K = 4.0 Mag = 2.0  Plan:  04/07 AM: K 4.3  Mag 2.1  No replacement needed at this time. Since electrolytes have been stable will plan to check again in 2 days on 4/11  Pharmacy will continue to monitor and replace as needed.    Angelique Blonder, PharmD, BCPS Clinical Pharmacist 02/13/2019 8:53 AM

## 2019-02-18 ENCOUNTER — Other Ambulatory Visit: Payer: Self-pay

## 2019-02-18 ENCOUNTER — Emergency Department: Payer: Medicaid Other

## 2019-02-18 ENCOUNTER — Inpatient Hospital Stay
Admission: EM | Admit: 2019-02-18 | Discharge: 2019-02-19 | DRG: 638 | Disposition: A | Discharge status: Home / Self Care | Payer: Medicaid Other | Attending: Internal Medicine | Admitting: Internal Medicine

## 2019-02-18 DIAGNOSIS — Z9119 Patient's noncompliance with other medical treatment and regimen: Secondary | ICD-10-CM | POA: Diagnosis not present

## 2019-02-18 DIAGNOSIS — F1410 Cocaine abuse, uncomplicated: Secondary | ICD-10-CM | POA: Diagnosis present

## 2019-02-18 DIAGNOSIS — E875 Hyperkalemia: Secondary | ICD-10-CM | POA: Diagnosis present

## 2019-02-18 DIAGNOSIS — R945 Abnormal results of liver function studies: Secondary | ICD-10-CM | POA: Diagnosis present

## 2019-02-18 DIAGNOSIS — I10 Essential (primary) hypertension: Secondary | ICD-10-CM | POA: Diagnosis present

## 2019-02-18 DIAGNOSIS — Z79899 Other long term (current) drug therapy: Secondary | ICD-10-CM | POA: Diagnosis not present

## 2019-02-18 DIAGNOSIS — Z8249 Family history of ischemic heart disease and other diseases of the circulatory system: Secondary | ICD-10-CM

## 2019-02-18 DIAGNOSIS — D72829 Elevated white blood cell count, unspecified: Secondary | ICD-10-CM | POA: Diagnosis present

## 2019-02-18 DIAGNOSIS — F17210 Nicotine dependence, cigarettes, uncomplicated: Secondary | ICD-10-CM | POA: Diagnosis present

## 2019-02-18 DIAGNOSIS — Z794 Long term (current) use of insulin: Secondary | ICD-10-CM | POA: Diagnosis not present

## 2019-02-18 DIAGNOSIS — E785 Hyperlipidemia, unspecified: Secondary | ICD-10-CM | POA: Diagnosis present

## 2019-02-18 DIAGNOSIS — E1110 Type 2 diabetes mellitus with ketoacidosis without coma: Secondary | ICD-10-CM | POA: Diagnosis not present

## 2019-02-18 DIAGNOSIS — N179 Acute kidney failure, unspecified: Secondary | ICD-10-CM | POA: Diagnosis present

## 2019-02-18 LAB — LACTIC ACID, PLASMA: Lactic Acid, Venous: 2.7 mmol/L (ref 0.5–1.9)

## 2019-02-18 LAB — COMPREHENSIVE METABOLIC PANEL
ALT: 47 U/L — ABNORMAL HIGH (ref 0–44)
AST: 26 U/L (ref 15–41)
Albumin: 4.3 g/dL (ref 3.5–5.0)
Alkaline Phosphatase: 132 U/L — ABNORMAL HIGH (ref 38–126)
Anion gap: >30 — ABNORMAL HIGH (ref 5–15)
BUN: 53 mg/dL — ABNORMAL HIGH (ref 6–20)
CO2: 8 mmol/L — ABNORMAL LOW (ref 22–32)
Calcium: 9.3 mg/dL (ref 8.9–10.3)
Chloride: 90 mmol/L — ABNORMAL LOW (ref 98–111)
Creatinine, Ser: 2.33 mg/dL — ABNORMAL HIGH (ref 0.61–1.24)
GFR calc Af Amer: 34 mL/min — ABNORMAL LOW (ref >60)
GFR calc non Af Amer: 29 mL/min — ABNORMAL LOW (ref >60)
Glucose, Bld: 1048 mg/dL (ref 70–99)
Potassium: 5.4 mmol/L — ABNORMAL HIGH (ref 3.5–5.1)
Sodium: 136 mmol/L (ref 135–145)
Total Bilirubin: 2 mg/dL — ABNORMAL HIGH (ref 0.3–1.2)
Total Protein: 7.2 g/dL (ref 6.5–8.1)

## 2019-02-18 LAB — CBC WITH DIFFERENTIAL/PLATELET
Abs Immature Granulocytes: 0.14 10*3/uL — ABNORMAL HIGH (ref 0.00–0.07)
Basophils Absolute: 0.1 10*3/uL (ref 0.0–0.1)
Basophils Relative: 0 %
Eosinophils Absolute: 0 10*3/uL (ref 0.0–0.5)
Eosinophils Relative: 0 %
HCT: 46.8 % (ref 39.0–52.0)
Hemoglobin: 14.5 g/dL (ref 13.0–17.0)
Immature Granulocytes: 1 %
Lymphocytes Relative: 7 %
Lymphs Abs: 1.1 10*3/uL (ref 0.7–4.0)
MCH: 30.9 pg (ref 26.0–34.0)
MCHC: 31 g/dL (ref 30.0–36.0)
MCV: 99.6 fL (ref 80.0–100.0)
Monocytes Absolute: 0.7 10*3/uL (ref 0.1–1.0)
Monocytes Relative: 4 %
Neutro Abs: 13.8 10*3/uL — ABNORMAL HIGH (ref 1.7–7.7)
Neutrophils Relative %: 88 %
Platelets: 618 10*3/uL — ABNORMAL HIGH (ref 150–400)
RBC: 4.7 MIL/uL (ref 4.22–5.81)
RDW: 13.2 % (ref 11.5–15.5)
WBC: 15.7 10*3/uL — ABNORMAL HIGH (ref 4.0–10.5)
nRBC: 0 % (ref 0.0–0.2)

## 2019-02-18 LAB — URINALYSIS, COMPLETE (UACMP) WITH MICROSCOPIC
Bacteria, UA: NONE SEEN
Bilirubin Urine: NEGATIVE
Glucose, UA: >=500 mg/dL — AB
Ketones, ur: 80 mg/dL — AB
Leukocytes,Ua: NEGATIVE
Nitrite: NEGATIVE
Protein, ur: NEGATIVE mg/dL
Specific Gravity, Urine: 1.021 (ref 1.005–1.030)
Squamous Epithelial / HPF: NONE SEEN (ref 0–5)
pH: 5 (ref 5.0–8.0)

## 2019-02-18 LAB — BLOOD GAS, VENOUS
Acid-base deficit: 20.3 mmol/L — ABNORMAL HIGH (ref 0.0–2.0)
Bicarbonate: 8.2 mmol/L — ABNORMAL LOW (ref 20.0–28.0)
O2 Saturation: 42.8 %
Patient temperature: 37
pCO2, Ven: 27 mmHg — ABNORMAL LOW (ref 44.0–60.0)
pH, Ven: 7.09 — CL (ref 7.250–7.430)
pO2, Ven: 35 mmHg (ref 32.0–45.0)

## 2019-02-18 LAB — GLUCOSE, CAPILLARY
Glucose-Capillary: >600 mg/dL (ref 70–99)
Glucose-Capillary: >600 mg/dL (ref 70–99)

## 2019-02-18 MED ORDER — CALCIUM GLUCONATE-NACL 1-0.675 GM/50ML-% IV SOLN
1.0000 g | Freq: Once | INTRAVENOUS | Status: AC
  Administered 2019-02-18: 1000 mg via INTRAVENOUS
  Filled 2019-02-18: qty 50

## 2019-02-18 MED ORDER — SODIUM CHLORIDE 0.9 % IV BOLUS
1000.0000 mL | Freq: Once | INTRAVENOUS | Status: AC
  Administered 2019-02-18: 22:00:00 1000 mL via INTRAVENOUS

## 2019-02-18 MED ORDER — INSULIN REGULAR(HUMAN) IN NACL 100-0.9 UT/100ML-% IV SOLN
INTRAVENOUS | Status: DC
  Administered 2019-02-18: 5.4 [IU]/h via INTRAVENOUS
  Filled 2019-02-18: qty 100

## 2019-02-18 MED ORDER — SODIUM CHLORIDE 0.9 % IV SOLN: 1.0000 g | Freq: Once | INTRAVENOUS | Status: DC

## 2019-02-18 NOTE — ED Triage Notes (Signed)
Pt arrives to ED from home via [facility] with c/c of hyperglycemia. EMS called by pts family due to suspected non-compliance with prescribed insulin.EMS reports transport vitals of 138/76, p74, r 18, 99% O2 on room air, CBG >600. 18G placed in L AC. Upon arrival, pt A&Ox4. NAD. Dr Marisa Severin at bedside.

## 2019-02-18 NOTE — ED Notes (Signed)
Date and time results received: 02/18/19 10:27 PM   Test: lactic acid Critical Value: 2.7  Name of Provider Notified: Dr Marisa Severin

## 2019-02-18 NOTE — ED Notes (Signed)
Date and time results received: 02/18/19 2224 (use smartphrase ".now" to insert current time)  Test: lactic Critical Value: 2.7  Name of Provider Notified: stafford  Orders Received? Or Actions Taken?: Orders Received - See Orders for details

## 2019-02-18 NOTE — ED Provider Notes (Signed)
[facility] Emergency Department Provider Note ____________________________________________   First MD Initiated Contact with Patient 02/18/19 2148     (approximate)  I have reviewed the triage vital signs and the nursing notes.   HISTORY  Chief Complaint Hyperglycemia  Level 5 caveat: History of present illness limited due to altered mental status  HPI Spencer [name] is a 58 y.o. male with PMH as noted below who presents with elevated blood sugar.  The patient states that he feels weak but denies any other acute complaints.  He cannot give me much meaningful history.  Past Medical History:  Diagnosis Date  . Brain aneurysm   . Diabetes mellitus without complication ([place])   . Hypertension   . Substance abuse [facility])     Patient Active Problem List   Diagnosis Date Noted  . Hypoglycemia 10/23/2018  . Chronic low back pain 01/16/2017  . Chronic neck pain 01/16/2017  . Diabetic neuropathy ([place]) 01/16/2017  . DM2 (diabetes mellitus, type 2) ([place]) 01/16/2017  . History of cocaine abuse ([place]) 01/16/2017  . Personal history of subdural hematoma 01/16/2017  . Closed displaced fracture of body of left calcaneus with delayed healing 12/13/2016  . Protein-calorie malnutrition, severe 11/08/2016  . DKA, type 2 ([place]) 11/06/2016  . Diabetic ketoacidosis ([place]) 04/03/2015  . DKA (diabetic ketoacidoses) ([place]) 04/03/2015  . Hypertension 04/03/2015  . Depression 04/03/2015  . Opiate dependence ([place]) 04/03/2015  . Tobacco abuse 04/03/2015  . Lumbosacral neuritis 07/19/2014  . Pain of finger of right hand 07/19/2014  . Type II or unspecified type diabetes mellitus without mention of complication, not stated as uncontrolled 07/19/2014  . Knee pain 09/12/2013  . Hernia of flank 09/20/2012  . Epidermoid cyst of skin 09/20/2012  . Chronic pain of both shoulders 03/27/2012    Past Surgical History:  Procedure Laterality Date  . HERNIA REPAIR    . NECK SURGERY      Prior to Admission medications   Medication Sig Start Date End Date Taking? Authorizing Provider  amLODipine (NORVASC) 10 MG tablet Take 1 tablet (10 mg total) by mouth daily. 01/19/19  Yes Mody, Ulice Bold, MD  atorvastatin (LIPITOR) 10 MG tablet Take 1 tablet (10 mg total) by mouth daily. 01/08/19  Yes Mayo, Pete Pelt, MD  blood glucose meter kit and supplies Check blood sugar 4 times daily 01/08/19  Yes Mayo, Pete Pelt, MD  cyclobenzaprine (FLEXERIL) 10 MG tablet Take 1 tablet (10 mg total) by mouth 3 (three) times daily as needed. 02/13/18  Yes Sable Feil, PA-C  gabapentin (NEURONTIN) 300 MG capsule Take 2 capsules (600 mg total) by mouth 3 (three) times daily. 01/08/19  Yes Mayo, Pete Pelt, MD  insulin aspart (NOVOLOG) 100 UNIT/ML FlexPen Inject 5 Units into the skin 3 (three) times daily with meals for 30 days. 01/08/19 02/18/19 Yes Mayo, Pete Pelt, MD  insulin glargine (LANTUS) 100 unit/mL SOPN Inject 0.24 mLs (24 Units total) into the skin daily. 02/13/19  Yes Demetrios Loll, MD  Insulin Pen Needle 31G X 5 MM MISC Check blood sugars 4 times daily 01/08/19  Yes Mayo, Pete Pelt, MD  nicotine (NICODERM CQ - DOSED IN MG/24 HOURS) 14 mg/24hr patch Place 1 patch (14 mg total) onto the skin daily. 02/13/19  Yes Demetrios Loll, MD    Allergies Patient has no known allergies.  Family History  Problem Relation Age of Onset  . CAD Sister   . Hypertension Sister   . Healthy Mother   .  Healthy Father     Social History Social History   Tobacco Use  . Smoking status: Current Every Day Smoker    Packs/day: 0.50    Years: 34.00    Pack years: 17.00    Types: Cigarettes  . Smokeless tobacco: Never Used  Substance Use Topics  . Alcohol use: Yes    Alcohol/week: 1.0 standard drinks    Types: 1 Cans of beer per week  . Drug use: Yes    Types: Cocaine    Review of Systems Level 5 caveat: Unable to obtain review of systems due to altered mental status/poor historian    ____________________________________________   PHYSICAL EXAM:  VITAL SIGNS: ED Triage Vitals  Enc Vitals Group     BP 02/18/19 2124 116/72     Pulse Rate 02/18/19 2124 95     Resp 02/18/19 2124 20     Temp 02/18/19 2124 97.8 F (36.6 C)     Temp Source 02/18/19 2124 Oral     SpO2 02/18/19 2124 100 %     Weight 02/18/19 2118 160 lb (72.6 kg)     Height 02/18/19 2118 6' (1.829 m)     Head Circumference --      Peak Flow --      Pain Score 02/18/19 2118 0     Pain Loc --      Pain Edu? --      Excl. in [place]? --     Constitutional: Alert but tired appearing.  No acute distress. Eyes: Conjunctivae are normal.  EOMI. Head: Atraumatic. Nose: No congestion/rhinnorhea. Mouth/Throat: Mucous membranes are dry.   Neck: Normal range of motion.  Cardiovascular: Normal rate, regular rhythm. Good peripheral circulation. Respiratory: Normal respiratory effort.  No retractions.  Gastrointestinal: Soft and nontender. No distention.  Genitourinary: No flank tenderness. Musculoskeletal: Extremities warm and well perfused.  Neurologic:  Normal speech and language. No gross focal neurologic deficits are appreciated.  Skin:  Skin is warm and dry. No rash noted. Psychiatric: Calm and cooperative.  ____________________________________________   LABS (all labs ordered are listed, but only abnormal results are displayed)  Labs Reviewed  GLUCOSE, CAPILLARY - Abnormal; Notable for the following components:      Result Value   Glucose-Capillary >600 (*)    All other components within normal limits  CBC WITH DIFFERENTIAL/PLATELET - Abnormal; Notable for the following components:   WBC 15.7 (*)    Platelets 618 (*)    Neutro Abs 13.8 (*)    Abs Immature Granulocytes 0.14 (*)    All other components within normal limits  LACTIC ACID, PLASMA - Abnormal; Notable for the following components:   Lactic Acid, Venous 2.7 (*)    All other components within normal limits  BLOOD GAS, VENOUS -  Abnormal; Notable for the following components:   pH, Ven 7.09 (*)    pCO2, Ven 27 (*)    Bicarbonate 8.2 (*)    Acid-base deficit 20.3 (*)    All other components within normal limits  URINALYSIS, COMPLETE (UACMP) WITH MICROSCOPIC - Abnormal; Notable for the following components:   Color, Urine STRAW (*)    APPearance CLEAR (*)    Glucose, UA >=500 (*)    Hgb urine dipstick SMALL (*)    Ketones, ur 80 (*)    All other components within normal limits  LACTIC ACID, PLASMA  COMPREHENSIVE METABOLIC PANEL   ____________________________________________  EKG  ED ECG REPORT I, Arta Silence, the attending physician, personally viewed and interpreted  this ECG.  Date: 02/18/2019 EKG Time: 2134  Rate: 94 Rhythm: normal sinus rhythm QRS Axis: normal Intervals: normal ST/T Wave abnormalities: Somewhat peaked T waves when compared to prior EKG Narrative Interpretation: no evidence of acute ischemia  ____________________________________________  RADIOLOGY  CXR: Pending  ____________________________________________   PROCEDURES  Procedure(s) performed: No  Procedures  Critical Care performed: Yes  CRITICAL CARE Performed by: Arta Silence   Total critical care time: 30 minutes  Critical care time was exclusive of separately billable procedures and treating other patients.  Critical care was necessary to treat or prevent imminent or life-threatening deterioration.  Critical care was time spent personally by me on the following activities: development of treatment plan with patient and/or surrogate as well as nursing, discussions with consultants, evaluation of patient's response to treatment, examination of patient, obtaining history from patient or surrogate, ordering and performing treatments and interventions, ordering and review of laboratory studies, ordering and review of radiographic studies, pulse oximetry and re-evaluation of patient's condition.  ____________________________________________   INITIAL IMPRESSION / ASSESSMENT AND PLAN / ED COURSE  Pertinent labs & imaging results that were available during my care of the patient were reviewed by me and considered in my medical decision making (see chart for details).  58 year old male with a history of insulin dependent diabetes and other PMH as noted above presents with hyperglycemia.  The patient appears weak and tired and is unable to give much meaningful history although he does answer my questions appropriately.  However he answers "I don't know" to a lot of questions.  I reviewed the past medical records in [place].  The patient was just admitted earlier this month with DKA and discharged on 02/13/2019.  He states that he is compliant with his medications.  On exam he is weak appearing.  His vital signs are normal.  Neuro exam is nonfocal.  He has dry mucous membranes.  The abdomen is soft and nontender.  Initial FSBG is over 600.    Presentation is consistent with DKA.  We will give IV fluids, start the patient on a glucose stabilizer, and obtain labs to evaluate for DKA.  EKG demonstrates peaked T waves when compared to EKG from a few weeks ago which concerns me for hyperkalemia.  Ultimately this will improve with an insulin infusion but I will order a dose of calcium gluconate to stabilize the cardiac membrane in the meantime.  ----------------------------------------- 11:18 PM on 02/18/2019 -----------------------------------------  Lab work-up is consistent with DKA, with low pH and ketones on the UA.  Vital signs remained stable.  The patient is receiving normal saline and an insulin infusion.  He will require admission.  I signed him out to the hospitalist Dr. Jannifer Franklin. ____________________________________________   FINAL CLINICAL IMPRESSION(S) / ED DIAGNOSES  Final diagnoses:  Diabetic ketoacidosis without coma associated with type 2 diabetes mellitus ([place])      NEW  MEDICATIONS STARTED DURING THIS VISIT:  New Prescriptions   No medications on file     Note:  This document was prepared using Dragon voice recognition software and may include unintentional dictation errors.    Arta Silence, MD 02/18/19 [phone]

## 2019-02-18 NOTE — ED Notes (Signed)
Received call from lab stating green top was hemolyzed. Will redraw now.

## 2019-02-18 NOTE — H&P (Signed)
[place] [facility] [place] NAME: Spencer [name]    MR#:  [id number]  DATE OF BIRTH:  1961/10/17  DATE OF ADMISSION:  02/18/2019  PRIMARY CARE PHYSICIAN: Jodi Marble, MD   REQUESTING/REFERRING PHYSICIAN: Cherylann Banas, MD  CHIEF COMPLAINT:   Chief Complaint  Patient presents with  . Hyperglycemia    HISTORY OF PRESENT ILLNESS:  Spencer [name]  is a 58 y.o. male who presents with chief complaint as above.  Patient presents the ED with significant nausea and vomiting, as well as severely elevated glucose.  He was recently admitted to our hospital for DKA.  He states that since he arrived home he has had persistent nausea with vomiting on a daily basis.  He denies any other symptoms, including fever/cough/diarrhea/abdominal pain/urinary issues.  He denies any sick contacts.  He is clearly in DKA again here in the ED, with an anion gap of 30, glucose greater than 1000, pH of 7.09.  Patient states he has been taking his insulin.  Hospitalist were called for admission  PAST MEDICAL HISTORY:   Past Medical History:  Diagnosis Date  . Brain aneurysm   . Diabetes mellitus without complication ([place])   . Hypertension   . Substance abuse ([place])      PAST SURGICAL HISTORY:   Past Surgical History:  Procedure Laterality Date  . HERNIA REPAIR    . NECK SURGERY       SOCIAL HISTORY:   Social History   Tobacco Use  . Smoking status: Current Every Day Smoker    Packs/day: 0.50    Years: 34.00    Pack years: 17.00    Types: Cigarettes  . Smokeless tobacco: Never Used  Substance Use Topics  . Alcohol use: Yes    Alcohol/week: 1.0 standard drinks    Types: 1 Cans of beer per week     FAMILY HISTORY:   Family History  Problem Relation Age of Onset  . CAD Sister   . Hypertension Sister   . Healthy Mother   . Healthy Father      DRUG ALLERGIES:  No Known Allergies  MEDICATIONS AT HOME:   Prior to Admission medications    Medication Sig Start Date End Date Taking? Authorizing Provider  amLODipine (NORVASC) 10 MG tablet Take 1 tablet (10 mg total) by mouth daily. 01/19/19  Yes Mody, Ulice Bold, MD  atorvastatin (LIPITOR) 10 MG tablet Take 1 tablet (10 mg total) by mouth daily. 01/08/19  Yes Mayo, Pete Pelt, MD  blood glucose meter kit and supplies Check blood sugar 4 times daily 01/08/19  Yes Mayo, Pete Pelt, MD  cyclobenzaprine (FLEXERIL) 10 MG tablet Take 1 tablet (10 mg total) by mouth 3 (three) times daily as needed. 02/13/18  Yes Sable Feil, PA-C  gabapentin (NEURONTIN) 300 MG capsule Take 2 capsules (600 mg total) by mouth 3 (three) times daily. 01/08/19  Yes Mayo, Pete Pelt, MD  insulin aspart (NOVOLOG) 100 UNIT/ML FlexPen Inject 5 Units into the skin 3 (three) times daily with meals for 30 days. 01/08/19 02/18/19 Yes Mayo, Pete Pelt, MD  insulin glargine (LANTUS) 100 unit/mL SOPN Inject 0.24 mLs (24 Units total) into the skin daily. 02/13/19  Yes Demetrios Loll, MD  Insulin Pen Needle 31G X 5 MM MISC Check blood sugars 4 times daily 01/08/19  Yes Mayo, Pete Pelt, MD  nicotine (NICODERM CQ - DOSED IN MG/24 HOURS) 14 mg/24hr patch Place 1 patch (14 mg total) onto the skin  daily. 02/13/19  Yes Demetrios Loll, MD    REVIEW OF SYSTEMS:  Review of Systems  Constitutional: Positive for malaise/fatigue. Negative for chills, fever and weight loss.  HENT: Negative for ear pain, hearing loss and tinnitus.   Eyes: Negative for blurred vision, double vision, pain and redness.  Respiratory: Negative for cough, hemoptysis and shortness of breath.   Cardiovascular: Negative for chest pain, palpitations, orthopnea and leg swelling.  Gastrointestinal: Positive for nausea and vomiting. Negative for abdominal pain, constipation and diarrhea.  Genitourinary: Negative for dysuria, frequency and hematuria.  Musculoskeletal: Negative for back pain, joint pain and neck pain.  Skin:       No acne, rash, or lesions  Neurological: Negative for  dizziness, tremors, focal weakness and weakness.  Endo/Heme/Allergies: Negative for polydipsia. Does not bruise/bleed easily.  Psychiatric/Behavioral: Negative for depression. The patient is not nervous/anxious and does not have insomnia.      VITAL SIGNS:   Vitals:   02/18/19 2118 02/18/19 2124 02/18/19 2243  BP:  116/72 (!) 147/77  Pulse:  95 92  Resp:  20 14  Temp:  97.8 F (36.6 C)   TempSrc:  Oral   SpO2:  100% 100%  Weight: 72.6 kg    Height: 6' (1.829 m)     Wt Readings from Last 3 Encounters:  02/18/19 72.6 kg  02/08/19 60.5 kg  01/17/19 61.8 kg    PHYSICAL EXAMINATION:  Physical Exam  Vitals reviewed. Constitutional: He is oriented to person, place, and time. He appears well-developed and well-nourished. No distress.  HENT:  Head: Normocephalic and atraumatic.  Dry mucous membranes  Eyes: Pupils are equal, round, and reactive to light. Conjunctivae and EOM are normal. No scleral icterus.  Neck: Normal range of motion. Neck supple. No JVD present. No thyromegaly present.  Cardiovascular: Regular rhythm and intact distal pulses. Exam reveals no gallop and no friction rub.  No murmur heard. Borderline tachycardic  Respiratory: Effort normal and breath sounds normal. No respiratory distress. He has no wheezes. He has no rales.  GI: Soft. Bowel sounds are normal. He exhibits no distension. There is no abdominal tenderness.  Musculoskeletal: Normal range of motion.        General: No edema.     Comments: No arthritis, no gout  Lymphadenopathy:    He has no cervical adenopathy.  Neurological: He is alert and oriented to person, place, and time. No cranial nerve deficit.  No dysarthria, no aphasia  Skin: Skin is warm and dry. No rash noted. No erythema.  Psychiatric: He has a normal mood and affect. His behavior is normal. Judgment and thought content normal.    LABORATORY PANEL:   CBC Recent Labs  Lab 02/18/19 2118  WBC 15.7*  HGB 14.5  HCT 46.8  PLT 618*    ------------------------------------------------------------------------------------------------------------------  Chemistries  Recent Labs  Lab 02/13/19 0416 02/18/19 2242  NA 139 PENDING  K 4.3 5.4*  CL 108 90*  CO2 23 8*  GLUCOSE 325* 1,048*  BUN 25* 53*  CREATININE 0.90 2.33*  CALCIUM 8.9 9.3  MG 2.1  --   AST  --  26  ALT  --  47*  ALKPHOS  --  132*  BILITOT  --  2.0*   ------------------------------------------------------------------------------------------------------------------  Cardiac Enzymes No results for input(s): TROPONINI in the last 168 hours. ------------------------------------------------------------------------------------------------------------------  RADIOLOGY:  No results found.  EKG:   Orders placed or performed during the hospital encounter of 01/17/19  . EKG 12-Lead  . EKG 12-Lead  .  EKG 12-Lead  . EKG 12-Lead  . EKG    IMPRESSION AND PLAN:  Principal Problem:   Diabetic ketoacidosis ([place]) -admit to stepdown per DKA admission order set with insulin drip, IV fluids, and intensivist consult Active Problems:   AKI -aggressive IV fluids, avoid nephrotoxins and monitor   Hypertension -continue home meds   Chart review performed and case discussed with ED provider. Labs, imaging and/or ECG reviewed by provider and discussed with patient/family. Management plans discussed with the patient and/or family.  DVT PROPHYLAXIS: SubQ lovenox   GI PROPHYLAXIS:  None  ADMISSION STATUS: Inpatient     CODE STATUS: Full Code Status History    Date Active Date Inactive Code Status Order ID Comments User Context   02/08/2019 0130 02/13/2019 1546 Full Code [id number]  Harrie Foreman, MD ED   01/17/2019 0412 01/18/2019 1903 Full Code [id number]  Mansy, Arvella Merles, MD ED   01/06/2019 1729 01/08/2019 1427 Full Code [id number]  Loletha Grayer, MD ED   10/25/2018 1356 10/27/2018 1345 Full Code [id number]  Dustin Flock, MD Inpatient   10/23/2018 1828  10/24/2018 1759 Full Code [id number]  Dustin Flock, MD Inpatient   01/15/2017 1417 01/18/2017 1716 Full Code [id number]  Gladstone Lighter, MD Inpatient   11/06/2016 0319 11/09/2016 1508 Full Code [id number]  Vickii Chafe Corine Shelter, MD Inpatient   11/06/2016 0229 11/06/2016 0319 Full Code [id number]  de Flo Shanks, MD Inpatient   04/04/2015 0008 04/06/2015 1418 Full Code [id number]  Phillips Grout, MD Inpatient      TOTAL CRITICAL CARE TIME TAKING CARE OF THIS PATIENT: 50 minutes.   Ethlyn Daniels 02/18/2019, 11:43 PM  [facility]  Office  [phone]  CC: Primary care physician; Jodi Marble, MD  Note:  This document was prepared using Dragon voice recognition software and may include unintentional dictation errors.

## 2019-02-19 ENCOUNTER — Other Ambulatory Visit: Payer: Self-pay | Admitting: Pulmonary Disease

## 2019-02-19 DIAGNOSIS — E1110 Type 2 diabetes mellitus with ketoacidosis without coma: Secondary | ICD-10-CM

## 2019-02-19 DIAGNOSIS — N179 Acute kidney failure, unspecified: Secondary | ICD-10-CM | POA: Diagnosis present

## 2019-02-19 LAB — GLUCOSE, CAPILLARY
Glucose-Capillary: 122 mg/dL — ABNORMAL HIGH (ref 70–99)
Glucose-Capillary: 145 mg/dL — ABNORMAL HIGH (ref 70–99)
Glucose-Capillary: 172 mg/dL — ABNORMAL HIGH (ref 70–99)
Glucose-Capillary: 180 mg/dL — ABNORMAL HIGH (ref 70–99)
Glucose-Capillary: 263 mg/dL — ABNORMAL HIGH (ref 70–99)
Glucose-Capillary: 282 mg/dL — ABNORMAL HIGH (ref 70–99)
Glucose-Capillary: 321 mg/dL — ABNORMAL HIGH (ref 70–99)
Glucose-Capillary: 372 mg/dL — ABNORMAL HIGH (ref 70–99)
Glucose-Capillary: 469 mg/dL — ABNORMAL HIGH (ref 70–99)
Glucose-Capillary: >600 mg/dL (ref 70–99)
Glucose-Capillary: >600 mg/dL (ref 70–99)

## 2019-02-19 LAB — CBC
HCT: 41.3 % (ref 39.0–52.0)
Hemoglobin: 13.8 g/dL (ref 13.0–17.0)
MCH: 30.7 pg (ref 26.0–34.0)
MCHC: 33.4 g/dL (ref 30.0–36.0)
MCV: 91.8 fL (ref 80.0–100.0)
Platelets: 572 10*3/uL — ABNORMAL HIGH (ref 150–400)
RBC: 4.5 MIL/uL (ref 4.22–5.81)
RDW: 12.9 % (ref 11.5–15.5)
WBC: 17.7 10*3/uL — ABNORMAL HIGH (ref 4.0–10.5)
nRBC: 0 % (ref 0.0–0.2)

## 2019-02-19 LAB — URINE DRUG SCREEN, QUALITATIVE (ARMC ONLY)
Amphetamines, Ur Screen: NOT DETECTED
Barbiturates, Ur Screen: NOT DETECTED
Benzodiazepine, Ur Scrn: NOT DETECTED
Cannabinoid 50 Ng, Ur ~~LOC~~: NOT DETECTED
Cocaine Metabolite,Ur ~~LOC~~: POSITIVE — AB
MDMA (Ecstasy)Ur Screen: NOT DETECTED
Methadone Scn, Ur: NOT DETECTED
Opiate, Ur Screen: NOT DETECTED
Phencyclidine (PCP) Ur S: NOT DETECTED
Tricyclic, Ur Screen: NOT DETECTED

## 2019-02-19 LAB — BASIC METABOLIC PANEL
Anion gap: 27 — ABNORMAL HIGH (ref 5–15)
Anion gap: 12 (ref 5–15)
Anion gap: 9 (ref 5–15)
Anion gap: 8 (ref 5–15)
BUN: 52 mg/dL — ABNORMAL HIGH (ref 6–20)
BUN: 44 mg/dL — ABNORMAL HIGH (ref 6–20)
BUN: 44 mg/dL — ABNORMAL HIGH (ref 6–20)
BUN: 42 mg/dL — ABNORMAL HIGH (ref 6–20)
CO2: 13 mmol/L — ABNORMAL LOW (ref 22–32)
CO2: 23 mmol/L (ref 22–32)
CO2: 24 mmol/L (ref 22–32)
CO2: 26 mmol/L (ref 22–32)
Calcium: 10.1 mg/dL (ref 8.9–10.3)
Calcium: 9.8 mg/dL (ref 8.9–10.3)
Calcium: 9.2 mg/dL (ref 8.9–10.3)
Calcium: 9.4 mg/dL (ref 8.9–10.3)
Chloride: 101 mmol/L (ref 98–111)
Chloride: 111 mmol/L (ref 98–111)
Chloride: 111 mmol/L (ref 98–111)
Chloride: 111 mmol/L (ref 98–111)
Creatinine, Ser: 2.23 mg/dL — ABNORMAL HIGH (ref 0.61–1.24)
Creatinine, Ser: 1.45 mg/dL — ABNORMAL HIGH (ref 0.61–1.24)
Creatinine, Ser: 1.37 mg/dL — ABNORMAL HIGH (ref 0.61–1.24)
Creatinine, Ser: 1.33 mg/dL — ABNORMAL HIGH (ref 0.61–1.24)
GFR calc Af Amer: 36 mL/min — ABNORMAL LOW (ref >60)
GFR calc Af Amer: >60 mL/min (ref >60)
GFR calc Af Amer: >60 mL/min (ref >60)
GFR calc Af Amer: >60 mL/min (ref >60)
GFR calc non Af Amer: 31 mL/min — ABNORMAL LOW (ref >60)
GFR calc non Af Amer: 52 mL/min — ABNORMAL LOW (ref >60)
GFR calc non Af Amer: 56 mL/min — ABNORMAL LOW (ref >60)
GFR calc non Af Amer: 58 mL/min — ABNORMAL LOW (ref >60)
Glucose, Bld: 593 mg/dL (ref 70–99)
Glucose, Bld: 196 mg/dL — ABNORMAL HIGH (ref 70–99)
Glucose, Bld: 281 mg/dL — ABNORMAL HIGH (ref 70–99)
Glucose, Bld: 143 mg/dL — ABNORMAL HIGH (ref 70–99)
Potassium: 4.2 mmol/L (ref 3.5–5.1)
Potassium: 3.8 mmol/L (ref 3.5–5.1)
Potassium: 4 mmol/L (ref 3.5–5.1)
Potassium: 4 mmol/L (ref 3.5–5.1)
Sodium: 141 mmol/L (ref 135–145)
Sodium: 146 mmol/L — ABNORMAL HIGH (ref 135–145)
Sodium: 144 mmol/L (ref 135–145)
Sodium: 145 mmol/L (ref 135–145)

## 2019-02-19 LAB — PROCALCITONIN: Procalcitonin: 0.25 ng/mL

## 2019-02-19 LAB — LACTIC ACID, PLASMA: Lactic Acid, Venous: 2.1 mmol/L (ref 0.5–1.9)

## 2019-02-19 MED ORDER — SODIUM CHLORIDE 0.9 % IV SOLN
INTRAVENOUS | Status: DC
  Administered 2019-02-19: 02:00:00 via INTRAVENOUS

## 2019-02-19 MED ORDER — INSULIN REGULAR(HUMAN) IN NACL 100-0.9 UT/100ML-% IV SOLN
INTRAVENOUS | Status: DC
  Administered 2019-02-19: 12.5 [IU]/h via INTRAVENOUS
  Filled 2019-02-19: qty 100

## 2019-02-19 MED ORDER — INSULIN ASPART 100 UNIT/ML FLEXPEN: 6.0000 [IU] | PEN_INJECTOR | Freq: Three times a day (TID) | SUBCUTANEOUS | 0 refills | Status: DC

## 2019-02-19 MED ORDER — DEXTROSE-NACL 5-0.45 % IV SOLN
INTRAVENOUS | Status: DC
  Administered 2019-02-19: 06:00:00 via INTRAVENOUS

## 2019-02-19 MED ORDER — ENOXAPARIN SODIUM 40 MG/0.4ML ~~LOC~~ SOLN: 40.0000 mg | SUBCUTANEOUS | Status: DC

## 2019-02-19 MED ORDER — INSULIN GLARGINE 100 UNIT/ML ~~LOC~~ SOLN
24.0000 [IU] | Freq: Every day | SUBCUTANEOUS | Status: DC
  Administered 2019-02-19: 08:00:00 24 [IU] via SUBCUTANEOUS
  Filled 2019-02-19 (×2): qty 0.24

## 2019-02-19 MED ORDER — INSULIN ASPART 100 UNIT/ML ~~LOC~~ SOLN
0.0000 [IU] | Freq: Three times a day (TID) | SUBCUTANEOUS | Status: DC
  Administered 2019-02-19: 3 [IU] via SUBCUTANEOUS
  Administered 2019-02-19: 11 [IU] via SUBCUTANEOUS
  Filled 2019-02-19 (×2): qty 1

## 2019-02-19 MED ORDER — INSULIN ASPART 100 UNIT/ML ~~LOC~~ SOLN: 0.0000 [IU] | Freq: Every day | SUBCUTANEOUS | Status: DC

## 2019-02-19 MED ORDER — SODIUM CHLORIDE 0.9 % IV SOLN
INTRAVENOUS | Status: AC
  Administered 2019-02-19: 02:00:00 via INTRAVENOUS

## 2019-02-19 MED ORDER — INSULIN ASPART 100 UNIT/ML ~~LOC~~ SOLN: 6.0000 [IU] | Freq: Three times a day (TID) | SUBCUTANEOUS | Status: DC

## 2019-02-19 NOTE — Progress Notes (Signed)
eLink Physician-Brief Progress Note Patient Name: KEYVION ESCHETE DOB: Sep 12, 1961 MRN: [id number]   Date of Service  02/19/2019  HPI/Events of Note  58 year old male with past medical history notable for insulin-dependent type 2 diabetes and substance abuse admitted to stepdown unit for DKA requiring insulin drip.  Patient also with AKI and hyperkalemia with noted peaked T waves on EKG.  Urine drug screen is positive for Cocaine. PCCM asked to assume care. VSS.  eICU Interventions  No new orders.      Intervention Category Evaluation Type: New Patient Evaluation  Lenell Antu 02/19/2019, 1:42 AM

## 2019-02-19 NOTE — ED Notes (Signed)
Rate change verified at bedside by Tula Nakayama
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
## 2019-02-19 NOTE — TOC Initial Note (Signed)
Transition of Care [facility]) - Initial/Assessment Note    Patient Details  Name: Spencer [name] MRN: [id number] Date of Birth: 23-Jan-1961  Transition of Care [facility]) CM/SW Contact:    Chapman Fitch, RN Phone Number: 02/19/2019, 11:40 AM  Clinical Narrative:                 Patient admitted for DKA.  Discharge orders are in for today.  Patient is familiar to this RNCM.  Recently discharged from [facility].   Patient states "I was moving in to my boarding house when I got sick this time".  Patient intends to stay with his dad for a few days and then proceed to move into the boarding house.  Patient again positive for cocaine Patient again states that he has all of his medication and testing supplies needed to manage his sugars.  Patient states "I was taking my medicine like I was supposed to I just started not feeling good"   Patient has active Medicaid.  RNCM confirmed previous admission that patient has full coverage that never expires.   Patient states that his sister will be picking him up from discharge, and taking him to follow up appointments.  Follow up appointment with PCP scheduled for Friday.   Expected Discharge Plan: Home/Self Care Barriers to Discharge: Continued Medical Work up   Patient Goals and CMS Choice Patient states their goals for this hospitalization and ongoing recovery are:: " i still want to try to get moved in to my boarding house"      Expected Discharge Plan and Services Expected Discharge Plan: Home/Self Care   Discharge Planning Services: CM Consult     Expected Discharge Date: 02/19/19                        Prior Living Arrangements/Services   Lives with:: Parents Patient language and need for interpreter reviewed:: No Do you feel safe going back to the place where you live?: Yes      Need for Family Participation in Patient Care: No (Comment)        Activities of Daily Living Home Assistive Devices/Equipment: Cane (specify quad or  straight) ADL Screening (condition at time of admission) Patient's cognitive ability adequate to safely complete daily activities?: Yes Is the patient deaf or have difficulty hearing?: No Does the patient have difficulty seeing, even when wearing glasses/contacts?: No Does the patient have difficulty concentrating, remembering, or making decisions?: No Patient able to express need for assistance with ADLs?: Yes Does the patient have difficulty dressing or bathing?: No Independently performs ADLs?: Yes (appropriate for developmental age) Does the patient have difficulty walking or climbing stairs?: No Weakness of Legs: None Weakness of Arms/Hands: None  Permission Sought/Granted                  Emotional Assessment Appearance:: Appears stated age     Orientation: : Oriented to Self, Oriented to Place, Oriented to  Time, Oriented to Situation Alcohol / Substance Use: Illicit Drugs    Admission diagnosis:  Diabetic ketoacidosis without coma associated with type 2 diabetes mellitus (HCC) [E11.10] Patient Active Problem List   Diagnosis Date Noted  . AKI (acute kidney injury) (HCC) 02/19/2019  . Hypoglycemia 10/23/2018  . Chronic low back pain 01/16/2017  . Chronic neck pain 01/16/2017  . Diabetic neuropathy (HCC) 01/16/2017  . DM2 (diabetes mellitus, type 2) (HCC) 01/16/2017  . History of cocaine abuse (HCC) 01/16/2017  . Personal history  of subdural hematoma 01/16/2017  . Closed displaced fracture of body of left calcaneus with delayed healing 12/13/2016  . Protein-calorie malnutrition, severe 11/08/2016  . DKA, type 2 (HCC) 11/06/2016  . Diabetic ketoacidosis (HCC) 04/03/2015  . DKA (diabetic ketoacidoses) (HCC) 04/03/2015  . Hypertension 04/03/2015  . Depression 04/03/2015  . Opiate dependence (HCC) 04/03/2015  . Tobacco abuse 04/03/2015  . Lumbosacral neuritis 07/19/2014  . Pain of finger of right hand 07/19/2014  . Type II or unspecified type diabetes mellitus  without mention of complication, not stated as uncontrolled 07/19/2014  . Knee pain 09/12/2013  . Hernia of flank 09/20/2012  . Epidermoid cyst of skin 09/20/2012  . Chronic pain of both shoulders 03/27/2012   PCP:  Sherron Mondayejan-Sie, S Ahmed, MD Pharmacy:   [facility] [phone]#[id number] [address]- [place], [place] - [id number] [facility] [address] [place] [id number] Phone: [phone] Fax: [phone]     Social Determinants of Health (SDOH) Interventions    Readmission Risk Interventions Readmission Risk Prevention Plan 02/19/2019 02/13/2019 02/10/2019  Transportation Screening Complete Complete Complete  PCP or Specialist Appt within 3-5 Days Complete - -  HRI or Home Care Consult Not Complete - -  HRI or Home Care Consult comments Patient does not meet homebound status - -  Palliative Care Screening Not Applicable - -  Medication Review (RN Care Manager) Complete Complete -  PCP or Specialist appointment within 3-5 days of discharge - (No Data) -  HRI or Home Care Consult - - Not Complete  SW Recovery Care/Counseling Consult - - Not Complete  SW Consult Not Complete Comments - - not indicated  Palliative Care Screening - - Not Applicable  Skilled Nursing Facility - - Not Applicable  Some recent data might be hidden

## 2019-02-19 NOTE — Progress Notes (Signed)
Hospitalist MD gave discharge order for the patient to go home. Sister of the patient called RN to express concern about the patient getting discharge. Hospitalist MD notified. Hospitalist MD instructed RN to still discharge patient home. There were no new orders.
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
## 2019-02-19 NOTE — Progress Notes (Addendum)
Inpatient Diabetes Program Recommendations  AACE/ADA: New Consensus Statement on Inpatient Glycemic Control (2015)  Target Ranges:  Prepandial:   less than 140 mg/dL      Peak postprandial:   less than 180 mg/dL (1-2 hours)      Critically ill patients:  140 - 180 mg/dL   Results for DEAVEN, SANTER (MRN [id number]) as of 02/19/2019 09:04  Ref. Range 02/18/2019 21:26 02/18/2019 23:17 02/19/2019 00:16 02/19/2019 01:27 02/19/2019 02:23 02/19/2019 03:26 02/19/2019 04:27 02/19/2019 05:27 02/19/2019 06:39 02/19/2019 06:42 02/19/2019 07:32 02/19/2019 08:53  Glucose-Capillary Latest Ref Range: 70 - 99 mg/dL >440 (HH)  IV Insulin Drip >600 (HH)   IV Insulin Drip >600 (HH)   IV Insulin Drip >600 (HH)   IV Insulin Drip 469 (H)   IV Insulin Drip 372 (H)   IV Insulin Drip 282 (H)   IV Insulin Drip 172 (H)   IV Insulin Drip 41 (LL)   Incorrect CBG reading as CBG taken 3 minutes after was 145 145 (H)   IV Insulin Drip 122 (H)   IV Insulin Drip   24 units LANTUS given at 8am 263 (H)   IV Insulin Drip   Results for HERRY, DASH (MRN [id number]) as of 02/19/2019 09:04  Ref. Range 02/18/2019 21:45  Cocaine Metabolite,Ur [place] Latest Ref Range: NONE DETECTED  POSITIVE (A)   Admit with: DKA (Glucose 1048 mg/dl, CO2 8, Anion Gap >44 on admission)  History: DM, Polysubstance Abuse  Home DM Meds: Lantus 24 units Daily       Novolog 5 units TID with meals  Current Orders: Lantus 24 units Daily      Novolog Moderate Correction Scale/ SSI (0-15 units) TID AC + HS    Note patient transitioning off the IV Insulin Drip this AM.  Lantus 24 units (home dose) given at 8:03am.  During last admission, patient was requiring Novolog for Meal Coverage as well.   MD- Please consider the following:   1. Reduce Novolog SSI to Sensitive scale (0-9 units) TID AC + HS   2. Start Novolog 6 units TID with meals as well  (Please add the following Hold Parameters: Hold if pt eats <50% of meal, Hold if pt  NPO)     Patient just discharged from hospital 04/09 after admission on 04/04 for DKA.  Well known to the Inpatient DM Team.  This is pt's 6th admission since December!!  Patient was counseled at length by the DM Coordinator on 01/07/2019 and again on 02/08/2019.  Pt told DM Coordinator he was having an issue with getting meds through Medicaid.  Medicaid is currently active and pt can get his meds for $4.  Note that patient was supposed to be taking [place] insulin prior to admit on 04/04, however, the DM Coordinator discovered that [place] insulin was not on the [facility] Formulary.  Therefore, Dr. Imogene Burn switched pt to Lantus insulin for home as Lantus is $4 on the [facility] formulary.     --Will follow patient during hospitalization--  Ambrose Finland RN, MSN, CDE Diabetes Coordinator Inpatient Glycemic Control Team Team Pager: [phone] (8a-5p)
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
## 2019-02-20 LAB — GLUCOSE, CAPILLARY: Glucose-Capillary: 41 mg/dL — CL (ref 70–99)

## 2019-05-28 ENCOUNTER — Emergency Department: Payer: Medicaid Other

## 2019-05-28 ENCOUNTER — Other Ambulatory Visit: Payer: Self-pay

## 2019-05-28 ENCOUNTER — Inpatient Hospital Stay
Admission: EM | Admit: 2019-05-28 | Discharge: 2019-06-04 | DRG: 871 | Disposition: A | Discharge status: Home / Self Care | Payer: Medicaid Other | Attending: Internal Medicine | Admitting: Internal Medicine

## 2019-05-28 DIAGNOSIS — D649 Anemia, unspecified: Secondary | ICD-10-CM | POA: Diagnosis not present

## 2019-05-28 DIAGNOSIS — E875 Hyperkalemia: Secondary | ICD-10-CM | POA: Diagnosis present

## 2019-05-28 DIAGNOSIS — G9341 Metabolic encephalopathy: Secondary | ICD-10-CM | POA: Diagnosis present

## 2019-05-28 DIAGNOSIS — R680 Hypothermia, not associated with low environmental temperature: Secondary | ICD-10-CM | POA: Diagnosis present

## 2019-05-28 DIAGNOSIS — K921 Melena: Secondary | ICD-10-CM | POA: Diagnosis not present

## 2019-05-28 DIAGNOSIS — E1022 Type 1 diabetes mellitus with diabetic chronic kidney disease: Secondary | ICD-10-CM | POA: Diagnosis present

## 2019-05-28 DIAGNOSIS — F1410 Cocaine abuse, uncomplicated: Secondary | ICD-10-CM | POA: Diagnosis present

## 2019-05-28 DIAGNOSIS — E1011 Type 1 diabetes mellitus with ketoacidosis with coma: Secondary | ICD-10-CM | POA: Diagnosis present

## 2019-05-28 DIAGNOSIS — I129 Hypertensive chronic kidney disease with stage 1 through stage 4 chronic kidney disease, or unspecified chronic kidney disease: Secondary | ICD-10-CM | POA: Diagnosis present

## 2019-05-28 DIAGNOSIS — R402222 Coma scale, best verbal response, incomprehensible words, at arrival to emergency department: Secondary | ICD-10-CM | POA: Diagnosis present

## 2019-05-28 DIAGNOSIS — Z794 Long term (current) use of insulin: Secondary | ICD-10-CM

## 2019-05-28 DIAGNOSIS — E876 Hypokalemia: Secondary | ICD-10-CM | POA: Diagnosis not present

## 2019-05-28 DIAGNOSIS — D696 Thrombocytopenia, unspecified: Secondary | ICD-10-CM | POA: Diagnosis not present

## 2019-05-28 DIAGNOSIS — R4182 Altered mental status, unspecified: Secondary | ICD-10-CM | POA: Diagnosis not present

## 2019-05-28 DIAGNOSIS — E872 Acidosis: Secondary | ICD-10-CM

## 2019-05-28 DIAGNOSIS — R6520 Severe sepsis without septic shock: Secondary | ICD-10-CM | POA: Diagnosis present

## 2019-05-28 DIAGNOSIS — E8729 Other acidosis: Secondary | ICD-10-CM

## 2019-05-28 DIAGNOSIS — D62 Acute posthemorrhagic anemia: Secondary | ICD-10-CM | POA: Diagnosis present

## 2019-05-28 DIAGNOSIS — B377 Candidal sepsis: Secondary | ICD-10-CM | POA: Diagnosis present

## 2019-05-28 DIAGNOSIS — Z981 Arthrodesis status: Secondary | ICD-10-CM

## 2019-05-28 DIAGNOSIS — Z20828 Contact with and (suspected) exposure to other viral communicable diseases: Secondary | ICD-10-CM | POA: Diagnosis present

## 2019-05-28 DIAGNOSIS — Z8249 Family history of ischemic heart disease and other diseases of the circulatory system: Secondary | ICD-10-CM

## 2019-05-28 DIAGNOSIS — F17200 Nicotine dependence, unspecified, uncomplicated: Secondary | ICD-10-CM | POA: Diagnosis present

## 2019-05-28 DIAGNOSIS — K210 Gastro-esophageal reflux disease with esophagitis: Secondary | ICD-10-CM | POA: Diagnosis present

## 2019-05-28 DIAGNOSIS — Z8639 Personal history of other endocrine, nutritional and metabolic disease: Secondary | ICD-10-CM | POA: Diagnosis not present

## 2019-05-28 DIAGNOSIS — R402112 Coma scale, eyes open, never, at arrival to emergency department: Secondary | ICD-10-CM | POA: Diagnosis present

## 2019-05-28 DIAGNOSIS — E870 Hyperosmolality and hypernatremia: Secondary | ICD-10-CM | POA: Diagnosis present

## 2019-05-28 DIAGNOSIS — K449 Diaphragmatic hernia without obstruction or gangrene: Secondary | ICD-10-CM | POA: Diagnosis present

## 2019-05-28 DIAGNOSIS — R402342 Coma scale, best motor response, flexion withdrawal, at arrival to emergency department: Secondary | ICD-10-CM | POA: Diagnosis present

## 2019-05-28 DIAGNOSIS — E860 Dehydration: Secondary | ICD-10-CM | POA: Diagnosis present

## 2019-05-28 DIAGNOSIS — K228 Other specified diseases of esophagus: Secondary | ICD-10-CM | POA: Diagnosis present

## 2019-05-28 DIAGNOSIS — E1110 Type 2 diabetes mellitus with ketoacidosis without coma: Secondary | ICD-10-CM | POA: Diagnosis not present

## 2019-05-28 DIAGNOSIS — N189 Chronic kidney disease, unspecified: Secondary | ICD-10-CM | POA: Diagnosis present

## 2019-05-28 DIAGNOSIS — K209 Esophagitis, unspecified: Secondary | ICD-10-CM | POA: Diagnosis not present

## 2019-05-28 DIAGNOSIS — N179 Acute kidney failure, unspecified: Secondary | ICD-10-CM

## 2019-05-28 DIAGNOSIS — G9340 Encephalopathy, unspecified: Secondary | ICD-10-CM

## 2019-05-28 DIAGNOSIS — A419 Sepsis, unspecified organism: Secondary | ICD-10-CM

## 2019-05-28 DIAGNOSIS — Z9114 Patient's other noncompliance with medication regimen: Secondary | ICD-10-CM | POA: Diagnosis not present

## 2019-05-28 DIAGNOSIS — F1910 Other psychoactive substance abuse, uncomplicated: Secondary | ICD-10-CM | POA: Diagnosis not present

## 2019-05-28 HISTORY — DX: Other injury of unspecified body region, initial encounter: T14.8XXA

## 2019-05-28 HISTORY — DX: Cardiac arrhythmia, unspecified: I49.9

## 2019-05-28 HISTORY — DX: Gastro-esophageal reflux disease without esophagitis: K21.9

## 2019-05-28 LAB — CBC WITH DIFFERENTIAL/PLATELET
Abs Immature Granulocytes: 0.57 10*3/uL — ABNORMAL HIGH (ref 0.00–0.07)
Basophils Absolute: 0.1 10*3/uL (ref 0.0–0.1)
Basophils Relative: 0 %
Eosinophils Absolute: 0 10*3/uL (ref 0.0–0.5)
Eosinophils Relative: 0 %
HCT: 45.1 % (ref 39.0–52.0)
Hemoglobin: 13.5 g/dL (ref 13.0–17.0)
Immature Granulocytes: 2 %
Lymphocytes Relative: 8 %
Lymphs Abs: 2 10*3/uL (ref 0.7–4.0)
MCH: 32.5 pg (ref 26.0–34.0)
MCHC: 29.9 g/dL — ABNORMAL LOW (ref 30.0–36.0)
MCV: 108.4 fL — ABNORMAL HIGH (ref 80.0–100.0)
Monocytes Absolute: 1.3 10*3/uL — ABNORMAL HIGH (ref 0.1–1.0)
Monocytes Relative: 5 %
Neutro Abs: 20.7 10*3/uL — ABNORMAL HIGH (ref 1.7–7.7)
Neutrophils Relative %: 85 %
Platelets: 208 10*3/uL (ref 150–400)
RBC: 4.16 MIL/uL — ABNORMAL LOW (ref 4.22–5.81)
RDW: 13 % (ref 11.5–15.5)
WBC: 24.6 10*3/uL — ABNORMAL HIGH (ref 4.0–10.5)
nRBC: 0 % (ref 0.0–0.2)

## 2019-05-28 LAB — LACTIC ACID, PLASMA
Lactic Acid, Venous: 3.7 mmol/L (ref 0.5–1.9)
Lactic Acid, Venous: 2.1 mmol/L (ref 0.5–1.9)

## 2019-05-28 LAB — URINE DRUG SCREEN, QUALITATIVE (ARMC ONLY)
Amphetamines, Ur Screen: NOT DETECTED
Barbiturates, Ur Screen: NOT DETECTED
Benzodiazepine, Ur Scrn: NOT DETECTED
Cannabinoid 50 Ng, Ur ~~LOC~~: NOT DETECTED
Cocaine Metabolite,Ur ~~LOC~~: POSITIVE — AB
MDMA (Ecstasy)Ur Screen: NOT DETECTED
Methadone Scn, Ur: NOT DETECTED
Opiate, Ur Screen: NOT DETECTED
Phencyclidine (PCP) Ur S: NOT DETECTED
Tricyclic, Ur Screen: NOT DETECTED

## 2019-05-28 LAB — URINALYSIS, COMPLETE (UACMP) WITH MICROSCOPIC
Bacteria, UA: NONE SEEN
Bilirubin Urine: NEGATIVE
Glucose, UA: >=500 mg/dL — AB
Ketones, ur: 80 mg/dL — AB
Leukocytes,Ua: NEGATIVE
Nitrite: NEGATIVE
Protein, ur: 30 mg/dL — AB
Specific Gravity, Urine: 1.018 (ref 1.005–1.030)
Squamous Epithelial / LPF: NONE SEEN (ref 0–5)
pH: 5 (ref 5.0–8.0)

## 2019-05-28 LAB — GLUCOSE, CAPILLARY
Glucose-Capillary: >600 mg/dL (ref 70–99)
Glucose-Capillary: >600 mg/dL (ref 70–99)
Glucose-Capillary: >600 mg/dL (ref 70–99)
Glucose-Capillary: >600 mg/dL (ref 70–99)
Glucose-Capillary: >600 mg/dL (ref 70–99)

## 2019-05-28 LAB — OSMOLALITY: Osmolality: 402 mOsm/kg (ref 275–295)

## 2019-05-28 LAB — MRSA PCR SCREENING: MRSA by PCR: NEGATIVE

## 2019-05-28 LAB — PROCALCITONIN: Procalcitonin: 8.84 ng/mL

## 2019-05-28 LAB — ETHANOL: Alcohol, Ethyl (B): <10 mg/dL (ref <10)

## 2019-05-28 LAB — SARS CORONAVIRUS 2 BY RT PCR (HOSPITAL ORDER, PERFORMED IN ~~LOC~~ HOSPITAL LAB): SARS Coronavirus 2: NEGATIVE

## 2019-05-28 LAB — SALICYLATE LEVEL: Salicylate Lvl: <7 mg/dL (ref 2.8–30.0)

## 2019-05-28 MED ORDER — METRONIDAZOLE IN NACL 5-0.79 MG/ML-% IV SOLN
500.0000 mg | Freq: Once | INTRAVENOUS | Status: AC
  Administered 2019-05-28: 500 mg via INTRAVENOUS
  Filled 2019-05-28: qty 100

## 2019-05-28 MED ORDER — SODIUM CHLORIDE 0.9 % IV SOLN
INTRAVENOUS | Status: DC
  Administered 2019-05-28: 18:00:00 via INTRAVENOUS

## 2019-05-28 MED ORDER — SODIUM CHLORIDE 0.9 % IV BOLUS
1000.0000 mL | Freq: Once | INTRAVENOUS | Status: AC
  Administered 2019-05-28: 1000 mL via INTRAVENOUS

## 2019-05-28 MED ORDER — DEXTROSE 50 % IV SOLN
25.0000 mL | INTRAVENOUS | Status: DC | PRN
  Administered 2019-05-29: 25 mL via INTRAVENOUS
  Filled 2019-05-28: qty 50

## 2019-05-28 MED ORDER — ENOXAPARIN SODIUM 40 MG/0.4ML ~~LOC~~ SOLN: 40.0000 mg | SUBCUTANEOUS | Status: DC

## 2019-05-28 MED ORDER — DEXTROSE-NACL 5-0.45 % IV SOLN: INTRAVENOUS | Status: DC

## 2019-05-28 MED ORDER — VANCOMYCIN VARIABLE DOSE PER UNSTABLE RENAL FUNCTION (PHARMACIST DOSING): Status: DC

## 2019-05-28 MED ORDER — VANCOMYCIN HCL IN DEXTROSE 1-5 GM/200ML-% IV SOLN: 1000.0000 mg | Freq: Once | INTRAVENOUS | Status: DC

## 2019-05-28 MED ORDER — INSULIN REGULAR(HUMAN) IN NACL 100-0.9 UT/100ML-% IV SOLN
INTRAVENOUS | Status: DC
  Administered 2019-05-28: 5.4 [IU]/h via INTRAVENOUS
  Administered 2019-05-29: 17.1 [IU]/h via INTRAVENOUS
  Administered 2019-05-29: 13.2 [IU]/h via INTRAVENOUS
  Filled 2019-05-28 (×4): qty 100

## 2019-05-28 MED ORDER — LORAZEPAM 2 MG/ML IJ SOLN
1.0000 mg | Freq: Four times a day (QID) | INTRAMUSCULAR | Status: AC | PRN
  Administered 2019-05-29 – 2019-05-30 (×4): 1 mg via INTRAVENOUS
  Filled 2019-05-28 (×4): qty 1

## 2019-05-28 MED ORDER — ONDANSETRON HCL 4 MG PO TABS: 4.0000 mg | ORAL_TABLET | Freq: Four times a day (QID) | ORAL | Status: DC | PRN

## 2019-05-28 MED ORDER — SODIUM CHLORIDE 0.9 % IV SOLN
2.0000 g | Freq: Once | INTRAVENOUS | Status: AC
  Administered 2019-05-28: 18:00:00 2 g via INTRAVENOUS
  Filled 2019-05-28: qty 2

## 2019-05-28 MED ORDER — SODIUM CHLORIDE 0.9 % IV SOLN: INTRAVENOUS | Status: DC

## 2019-05-28 MED ORDER — CHLORHEXIDINE GLUCONATE CLOTH 2 % EX PADS
6.0000 | MEDICATED_PAD | Freq: Every day | CUTANEOUS | Status: DC
  Administered 2019-05-28 – 2019-06-04 (×5): 6 via TOPICAL

## 2019-05-28 MED ORDER — STERILE WATER FOR INJECTION IV SOLN
INTRAVENOUS | Status: DC
  Filled 2019-05-28 (×4): qty 850

## 2019-05-28 MED ORDER — INSULIN REGULAR BOLUS VIA INFUSION
0.0000 [IU] | Freq: Three times a day (TID) | INTRAVENOUS | Status: DC
  Filled 2019-05-28: qty 10

## 2019-05-28 MED ORDER — VANCOMYCIN HCL 10 G IV SOLR
2000.0000 mg | Freq: Once | INTRAVENOUS | Status: AC
  Administered 2019-05-28: 2000 mg via INTRAVENOUS
  Filled 2019-05-28: qty 2000

## 2019-05-28 MED ORDER — INSULIN ASPART 100 UNIT/ML ~~LOC~~ SOLN
10.0000 [IU] | Freq: Once | SUBCUTANEOUS | Status: AC
  Administered 2019-05-28: 10 [IU] via INTRAVENOUS
  Filled 2019-05-28: qty 1

## 2019-05-28 MED ORDER — ONDANSETRON HCL 4 MG/2ML IJ SOLN
4.0000 mg | Freq: Four times a day (QID) | INTRAMUSCULAR | Status: DC | PRN
  Administered 2019-05-30: 4 mg via INTRAVENOUS
  Filled 2019-05-28: qty 2

## 2019-05-28 MED ORDER — LORAZEPAM 1 MG PO TABS: 1.0000 mg | ORAL_TABLET | Freq: Four times a day (QID) | ORAL | Status: AC | PRN

## 2019-05-28 MED ORDER — HEPARIN SODIUM (PORCINE) 5000 UNIT/ML IJ SOLN
5000.0000 [IU] | Freq: Two times a day (BID) | INTRAMUSCULAR | Status: DC
  Administered 2019-05-29 (×2): 5000 [IU] via SUBCUTANEOUS
  Filled 2019-05-28 (×2): qty 1

## 2019-05-28 MED ORDER — THIAMINE HCL 100 MG/ML IJ SOLN
100.0000 mg | Freq: Every day | INTRAMUSCULAR | Status: DC
  Administered 2019-05-29 – 2019-05-31 (×3): 100 mg via INTRAVENOUS
  Filled 2019-05-28 (×3): qty 2

## 2019-05-28 MED ORDER — VITAMIN B-1 100 MG PO TABS
100.0000 mg | ORAL_TABLET | Freq: Every day | ORAL | Status: DC
  Administered 2019-06-01 – 2019-06-04 (×4): 100 mg via ORAL
  Filled 2019-05-28 (×4): qty 1

## 2019-05-28 MED ORDER — FOLIC ACID 1 MG PO TABS
1.0000 mg | ORAL_TABLET | Freq: Every day | ORAL | Status: DC
  Administered 2019-06-01 – 2019-06-04 (×4): 1 mg via ORAL
  Filled 2019-05-28 (×4): qty 1

## 2019-05-28 MED ORDER — SODIUM CHLORIDE 0.9 % IV SOLN
1.0000 g | INTRAVENOUS | Status: DC
  Filled 2019-05-28: qty 1

## 2019-05-28 MED ORDER — ACETAMINOPHEN 325 MG PO TABS: 650.0000 mg | ORAL_TABLET | Freq: Four times a day (QID) | ORAL | Status: DC | PRN

## 2019-05-28 MED ORDER — ACETAMINOPHEN 650 MG RE SUPP
650.0000 mg | Freq: Four times a day (QID) | RECTAL | Status: DC | PRN
  Administered 2019-05-29: 650 mg via RECTAL
  Filled 2019-05-28: qty 1

## 2019-05-28 MED ORDER — ADULT MULTIVITAMIN W/MINERALS CH
1.0000 | ORAL_TABLET | Freq: Every day | ORAL | Status: DC
  Administered 2019-06-01 – 2019-06-04 (×4): 1 via ORAL
  Filled 2019-05-28 (×4): qty 1

## 2019-05-28 MED ORDER — STERILE WATER FOR INJECTION IV SOLN: INTRAVENOUS | Status: DC

## 2019-05-28 NOTE — ED Triage Notes (Signed)
Pt in via [facility] from unknown residence.  Per EMS, pt became "sick last night," worsening throughout the day.  Pt unresponsive upon arrival but able to maintain airway at this time.  Multiple PIV's in place, approximately 1,089mL's NaCl given via EMS PTA.  EDP at bedside.
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
## 2019-05-28 NOTE — ED Notes (Signed)
Pt transported to CT; remains on monitor, this RN at bedside.
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
## 2019-05-28 NOTE — Consult Note (Signed)
PHARMACY -  BRIEF ANTIBIOTIC NOTE   Pharmacy has received consult(s) for Dr. Quentin Cornwall  from an ED provider.  The patient's profile has been reviewed for ht/wt/allergies/indication/available labs.    One time order(s) placed for Cefepime 2g x1 dose and Vancomycin 2000 mg x1 dose for unknown source.   Further antibiotics/pharmacy consults should be ordered by admitting physician if indicated.    Thank you for allowing pharmacy to be a part of this patient's care.   Kristeen Miss, PharmD Clinical Pharmacist

## 2019-05-28 NOTE — Progress Notes (Signed)
Spoke with Dr. Anthonette Legato from nephrology, will start bicarb drip with 150 M EQ in sterile water, continue until bicarb is 20.  Trend BMP every every 4 hours.

## 2019-05-28 NOTE — Consult Note (Addendum)
Pharmacy Antibiotic Note  Spencer [name] is a 58 y.o. male admitted on 05/28/2019 with sepsis.  Pharmacy has been consulted for Vancomycin and Cefepime dosing. I called Lillia Mountain, RN to obtain estimated age but was unable to obtain that information since the patient in unresponsive.  Given renal function is poor, will dose vancomycin based on levels for now. As for cefepime, will assume CrCl < 36mL/min.   Patient received vancomycin, metronidazole, and cefepime in the ED.   Plan: Cefepime 1 g Q24H - will start 7/23 @ 1700. May consider adjusting dose pending Scr with AM labs tomorrow.  Will obtain 24-hr Vancomycin random level and follow Scr with AM labs.   Height: 6\' 3"  (190.5 cm) Weight: 180 lb (81.6 kg) IBW/kg (Calculated) : 84.5  Temp (24hrs), Avg:89.1 F (31.7 C), Min:88.6 F (31.4 C), Max:89.9 F (32.2 C)  Recent Labs  Lab 05/28/19 1638  WBC 24.6*  CREATININE 4.05*  LATICACIDVEN 3.7*    Estimated Creatinine Clearance: 5.9 mL/min (A) (by C-G formula based on SCr of 4.05 mg/dL (H)).    No Known Allergies  Antimicrobials this admission: 07/22 Cefepime >>  07/22 Vancomycin >>   7/22 Metronidazole x1 dose  Dose adjustments this admission: N/A  Microbiology results: 7/22 BCx: pending  7/22 UCx: pending   Thank you for allowing pharmacy to be a part of this patient's care.  Rowland Lathe 05/28/2019 7:11 PM

## 2019-05-28 NOTE — ED Provider Notes (Signed)
[facility] Emergency Department Provider Note    First MD Initiated Contact with Patient 05/28/19 1631     (approximate)  I have reviewed the triage vital signs and the nursing notes.   HISTORY  Chief Complaint Altered Mental Status  Level V Caveat: AMS   HPI Spencer [name] is a [age] y.o. male with no known past medical history arriving as a Spencer [name] presents for evaluation of confusion elevated blood sugar.  Reportedly was from a family member but unable to contact any family members at this time.  EMS arrived and patient was protecting his airway but very drowsy and almost intoxicated appearing.  Found to have blood sugars reading "high".  On IV fluids.  Was initially hypotensive.  No further history or information available.    No past medical history on file. No family history on file.  Patient Active Problem List   Diagnosis Date Noted  . DKA (diabetic ketoacidoses) (HCC) 05/28/2019      Prior to Admission medications   Not on File    Allergies Patient has no known allergies.    Social History Social History   Tobacco Use  . Smoking status: Not on file  Substance Use Topics  . Alcohol use: Not on file  . Drug use: Not on file    Review of Systems Patient denies headaches, rhinorrhea, blurry vision, numbness, shortness of breath, chest pain, edema, cough, abdominal pain, nausea, vomiting, diarrhea, dysuria, fevers, rashes or hallucinations unless otherwise stated above in HPI. ____________________________________________   PHYSICAL EXAM:  VITAL SIGNS: Vitals:   05/28/19 1800 05/28/19 1830  BP: 101/65 105/67  Pulse: 72 76  Resp: (!) 23 (!) 23  Temp: (!) 89.2 F (31.8 C) (!) 89.9 F (32.2 C)  SpO2: 100% 100%    Constitutional: drowsy, encephalopathic, kussmaul respirations Eyes: Conjunctivae are normal.  [place]la Head: Atraumatic. Nose: No congestion/rhinnorhea. Mouth/Throat: Mucous membranes are dry   Neck: No stridor.  Painless ROM.  Cardiovascular: norma, regular rhythm. Grossly normal heart sounds.  Good peripheral circulation. Respiratory: kussmaul respirations.  No retractions. Lungs CTAB. Gastrointestinal: Soft and nontender. No distention. No abdominal bruits. No CVA tenderness. Genitourinary: normal external genitalia Musculoskeletal: No lower extremity tenderness nor edema.  No joint effusions. Neurologic: encephalopathic and not following commands, respons to painful stimuli Skin:  Skin is warm, dry and intact. No rash noted. Psychiatric unable to assess ____________________________________________   LABS (all labs ordered are listed, but only abnormal results are displayed)  Results for orders placed or performed during the hospital encounter of 05/28/19 (from the past 24 hour(s))  Glucose, capillary     Status: Abnormal   Collection Time: 05/28/19  4:34 PM  Result Value Ref Range   Glucose-Capillary >600 (HH) 70 - 99 mg/dL  Lactic acid, plasma     Status: Abnormal   Collection Time: 05/28/19  4:38 PM  Result Value Ref Range   Lactic Acid, Venous 3.7 (HH) 0.5 - 1.9 mmol/L  Comprehensive metabolic panel     Status: Abnormal   Collection Time: 05/28/19  4:38 PM  Result Value Ref Range   Sodium 132 (L) 135 - 145 mmol/L   Potassium 5.4 (H) 3.5 - 5.1 mmol/L   Chloride 92 (L) 98 - 111 mmol/L   CO2 <7 (L) 22 - 32 mmol/L   Glucose, Bld 1,180 (HH) 70 - 99 mg/dL   BUN 65 (H) 8 - 23 mg/dL   Creatinine, Ser 0.634.05 (H) 0.61 - 1.24 mg/dL  Calcium 7.9 (L) 8.9 - 10.3 mg/dL   Total Protein 5.7 (L) 6.5 - 8.1 g/dL   Albumin 3.6 3.5 - 5.0 g/dL   AST 32 15 - 41 U/L   ALT 21 0 - 44 U/L   Alkaline Phosphatase 143 (H) 38 - 126 U/L   Total Bilirubin 1.9 (H) 0.3 - 1.2 mg/dL   GFR calc non Af Amer 10 (L) >60 mL/min   GFR calc Af Amer 11 (L) >60 mL/min   Anion gap NOT CALCULATED 5 - 15  CBC WITH DIFFERENTIAL     Status: Abnormal   Collection Time: 05/28/19  4:38 PM  Result Value Ref Range   WBC 24.6 (H)  4.0 - 10.5 K/uL   RBC 4.16 (L) 4.22 - 5.81 MIL/uL   Hemoglobin 13.5 13.0 - 17.0 g/dL   HCT 09.845.1 11.939.0 - 14.752.0 %   MCV 108.4 (H) 80.0 - 100.0 fL   MCH 32.5 26.0 - 34.0 pg   MCHC 29.9 (L) 30.0 - 36.0 g/dL   RDW 82.913.0 56.211.5 - 13.015.5 %   Platelets 208 150 - 400 K/uL   nRBC 0.0 0.0 - 0.2 %   Neutrophils Relative % 85 %   Neutro Abs 20.7 (H) 1.7 - 7.7 K/uL   Lymphocytes Relative 8 %   Lymphs Abs 2.0 0.7 - 4.0 K/uL   Monocytes Relative 5 %   Monocytes Absolute 1.3 (H) 0.1 - 1.0 K/uL   Eosinophils Relative 0 %   Eosinophils Absolute 0.0 0.0 - 0.5 K/uL   Basophils Relative 0 %   Basophils Absolute 0.1 0.0 - 0.1 K/uL   Immature Granulocytes 2 %   Abs Immature Granulocytes 0.57 (H) 0.00 - 0.07 K/uL  Procalcitonin     Status: None   Collection Time: 05/28/19  4:38 PM  Result Value Ref Range   Procalcitonin 8.84 ng/mL  Blood gas, venous ([facility], [facility], [facility])     Status: Abnormal (Preliminary result)   Collection Time: 05/28/19  4:38 PM  Result Value Ref Range   pH, Ven <6.900 (LL) 7.250 - 7.430   pCO2, Ven 24 (L) 44.0 - 60.0 mmHg   pO2, Ven 42.0 32.0 - 45.0 mmHg   Bicarbonate PENDING 20.0 - 28.0 mmol/L   O2 Saturation PENDING %   Patient temperature 37.0    Collection site VENOUS    Sample type VENOUS   Urinalysis, Complete w Microscopic     Status: Abnormal   Collection Time: 05/28/19  4:38 PM  Result Value Ref Range   Color, Urine YELLOW (A) YELLOW   APPearance CLOUDY (A) CLEAR   Specific Gravity, Urine 1.018 1.005 - 1.030   pH 5.0 5.0 - 8.0   Glucose, UA >=500 (A) NEGATIVE mg/dL   Hgb urine dipstick MODERATE (A) NEGATIVE   Bilirubin Urine NEGATIVE NEGATIVE   Ketones, ur 80 (A) NEGATIVE mg/dL   Protein, ur 30 (A) NEGATIVE mg/dL   Nitrite NEGATIVE NEGATIVE   Leukocytes,Ua NEGATIVE NEGATIVE   RBC / HPF 0-5 0 - 5 RBC/hpf   WBC, UA 0-5 0 - 5 WBC/hpf   Bacteria, UA NONE SEEN NONE SEEN   Squamous Epithelial / LPF NONE SEEN 0 - 5  Glucose, capillary     Status: Abnormal   Collection Time:  05/28/19  5:59 PM  Result Value Ref Range   Glucose-Capillary >600 (HH) 70 - 99 mg/dL   ____________________________________________  EKG My review and personal interpretation at Time: 16:29   Indication: ams  Rate: 80  Rhythm: sinus Axis: normal Other: normal intervals, no baseline wander limiting interpretation ____________________________________________  RADIOLOGY  I personally reviewed all radiographic images ordered to evaluate for the above acute complaints and reviewed radiology reports and findings.  These findings were personally discussed with the patient.  Please see medical record for radiology report.  ____________________________________________   PROCEDURES  Procedure(s) performed:  .Critical Care Performed by: Willy Eddyobinson, Eswin Worrell, MD Authorized by: Willy Eddyobinson, Nazirah Tri, MD   Critical care provider statement:    Critical care time (minutes):  40   Critical care was necessary to treat or prevent imminent or life-threatening deterioration of the following conditions:  Metabolic crisis and sepsis   Critical care was time spent personally by me on the following activities:  Discussions with consultants, evaluation of patient's response to treatment, examination of patient, ordering and performing treatments and interventions, ordering and review of laboratory studies, ordering and review of radiographic studies, pulse oximetry, re-evaluation of patient's condition, obtaining history from patient or surrogate and review of old charts      Critical Care performed: yes ____________________________________________   INITIAL IMPRESSION / ASSESSMENT AND PLAN / ED COURSE  Pertinent labs & imaging results that were available during my care of the patient were reviewed by me and considered in my medical decision making (see chart for details).   DDX: Dehydration, sepsis, pna, uti, hypoglycemia, cva, drug effect, withdrawal, encephalitis   Spencer [name] is a [age] y.o. who  presents to the ED with presentation as described above.  Patient hypothermic tachycardic critically ill-appearing encephalopathic for protecting his airway.  Given his hyperglycemia certainly concerning for metabolic hypoglycemic illness.  Will start aggressive IV fluid resuscitation will order imaging and CT.  The patient will be placed on continuous pulse oximetry and telemetry for monitoring.  Laboratory evaluation will be sent to evaluate for the above complaints.     Clinical Course as of May 27 1849  Wed May 28, 2019  1731 Patient with profound metabolic acidosis with severe hyperglycemia likely DKA  versus hhnc with starvation ketosis..  Patient is protecting his airway but encephalopathic concerning for ketoacidosis with coma.  Patient given IV insulin as well as IV antibiotics.  We will continue with IV fluid resuscitation.  Patient critically ill will require hospitalization in ICU.   [PR]  1847 Patient following commands.  Mumbling.  Still protecting his airway.  Given improvement I do not feel that intubation clinically indicated at this time due to concern for worsening of his acidosis   [PR]    Clinical Course User Index [PR] Willy Eddyobinson, Jameila Keeny, MD    The patient was evaluated in Emergency Department today for the symptoms described in the history of present illness. He/she was evaluated in the context of the global COVID-19 pandemic, which necessitated consideration that the patient might be at risk for infection with the SARS-CoV-2 virus that causes COVID-19. Institutional protocols and algorithms that pertain to the evaluation of patients at risk for COVID-19 are in a state of rapid change based on information released by regulatory bodies including the CDC and federal and state organizations. These policies and algorithms were followed during the patient's care in the ED.  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    Merlyn Lot, MD 05/28/19 1850
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
## 2019-05-28 NOTE — ED Notes (Signed)
Bair Hugger applied at this time.   Neuro status remains the same as previously documented at this time.  EDP at bedside for patient reassessment.

## 2019-05-28 NOTE — Progress Notes (Signed)
PHARMACIST - PHYSICIAN COMMUNICATION  CONCERNING:  Enoxaparin (Lovenox) for DVT Prophylaxis    RECOMMENDATION: Patient was prescribed enoxaprin 40mg  q24 hours for VTE prophylaxis.   Filed Weights   05/28/19 1646  Weight: 180 lb (81.6 kg)    Body mass index is 22.5 kg/m.  Estimated Creatinine Clearance: 5.9 mL/min (A) (by C-G formula based on SCr of 4.05 mg/dL (H)).   Based on [place] patient is candidate for [facility] Q12H  DESCRIPTION: Pharmacy has switched enoxaparin dose per [facility] policy to be changed to [facility].  Patient is now receiving SQH 12H.    Rowland Lathe, PharmD Clinical Pharmacist  05/28/2019 8:12 PM

## 2019-05-28 NOTE — ED Notes (Signed)
Radiology to bedside. 

## 2019-05-28 NOTE — ED Notes (Signed)
EDP to bedside for reassessment.  Neuro status remains that same at this time.

## 2019-05-29 LAB — BASIC METABOLIC PANEL
Anion gap: 17 — ABNORMAL HIGH (ref 5–15)
Anion gap: 12 (ref 5–15)
Anion gap: 9 (ref 5–15)
BUN: 74 mg/dL — ABNORMAL HIGH (ref 6–20)
BUN: 74 mg/dL — ABNORMAL HIGH (ref 6–20)
BUN: 81 mg/dL — ABNORMAL HIGH (ref 6–20)
BUN: 88 mg/dL — ABNORMAL HIGH (ref 6–20)
BUN: 88 mg/dL — ABNORMAL HIGH (ref 6–20)
CO2: <7 mmol/L — ABNORMAL LOW (ref 22–32)
CO2: <7 mmol/L — ABNORMAL LOW (ref 22–32)
CO2: 11 mmol/L — ABNORMAL LOW (ref 22–32)
CO2: 14 mmol/L — ABNORMAL LOW (ref 22–32)
CO2: 16 mmol/L — ABNORMAL LOW (ref 22–32)
Calcium: 7.3 mg/dL — ABNORMAL LOW (ref 8.9–10.3)
Calcium: 7.1 mg/dL — ABNORMAL LOW (ref 8.9–10.3)
Calcium: 7.5 mg/dL — ABNORMAL LOW (ref 8.9–10.3)
Calcium: 7.7 mg/dL — ABNORMAL LOW (ref 8.9–10.3)
Calcium: 7.8 mg/dL — ABNORMAL LOW (ref 8.9–10.3)
Chloride: 107 mmol/L (ref 98–111)
Chloride: 116 mmol/L — ABNORMAL HIGH (ref 98–111)
Chloride: 121 mmol/L — ABNORMAL HIGH (ref 98–111)
Chloride: 123 mmol/L — ABNORMAL HIGH (ref 98–111)
Chloride: 125 mmol/L — ABNORMAL HIGH (ref 98–111)
Creatinine, Ser: 3.99 mg/dL — ABNORMAL HIGH (ref 0.61–1.24)
Creatinine, Ser: 3.79 mg/dL — ABNORMAL HIGH (ref 0.61–1.24)
Creatinine, Ser: 3.68 mg/dL — ABNORMAL HIGH (ref 0.61–1.24)
Creatinine, Ser: 3.6 mg/dL — ABNORMAL HIGH (ref 0.61–1.24)
Creatinine, Ser: 3.3 mg/dL — ABNORMAL HIGH (ref 0.61–1.24)
GFR calc Af Amer: 12 mL/min — ABNORMAL LOW (ref >60)
GFR calc Af Amer: 19 mL/min — ABNORMAL LOW (ref >60)
GFR calc Af Amer: 20 mL/min — ABNORMAL LOW (ref >60)
GFR calc Af Amer: 20 mL/min — ABNORMAL LOW (ref >60)
GFR calc Af Amer: 22 mL/min — ABNORMAL LOW (ref >60)
GFR calc non Af Amer: 10 mL/min — ABNORMAL LOW (ref >60)
GFR calc non Af Amer: 16 mL/min — ABNORMAL LOW (ref >60)
GFR calc non Af Amer: 17 mL/min — ABNORMAL LOW (ref >60)
GFR calc non Af Amer: 17 mL/min — ABNORMAL LOW (ref >60)
GFR calc non Af Amer: 19 mL/min — ABNORMAL LOW (ref >60)
Glucose, Bld: 933 mg/dL (ref 70–99)
Glucose, Bld: 618 mg/dL (ref 70–99)
Glucose, Bld: 472 mg/dL — ABNORMAL HIGH (ref 70–99)
Glucose, Bld: 349 mg/dL — ABNORMAL HIGH (ref 70–99)
Glucose, Bld: 253 mg/dL — ABNORMAL HIGH (ref 70–99)
Potassium: 4.1 mmol/L (ref 3.5–5.1)
Potassium: 3.2 mmol/L — ABNORMAL LOW (ref 3.5–5.1)
Potassium: 3.4 mmol/L — ABNORMAL LOW (ref 3.5–5.1)
Potassium: 3.3 mmol/L — ABNORMAL LOW (ref 3.5–5.1)
Potassium: 3.2 mmol/L — ABNORMAL LOW (ref 3.5–5.1)
Sodium: 138 mmol/L (ref 135–145)
Sodium: 143 mmol/L (ref 135–145)
Sodium: 149 mmol/L — ABNORMAL HIGH (ref 135–145)
Sodium: 149 mmol/L — ABNORMAL HIGH (ref 135–145)
Sodium: 150 mmol/L — ABNORMAL HIGH (ref 135–145)

## 2019-05-29 LAB — BLOOD CULTURE ID PANEL (REFLEXED)
Acinetobacter baumannii: NOT DETECTED
Candida albicans: DETECTED — AB
Candida glabrata: NOT DETECTED
Candida krusei: NOT DETECTED
Candida parapsilosis: NOT DETECTED
Candida tropicalis: NOT DETECTED
Enterobacter cloacae complex: NOT DETECTED
Enterobacteriaceae species: NOT DETECTED
Enterococcus species: NOT DETECTED
Escherichia coli: NOT DETECTED
Haemophilus influenzae: NOT DETECTED
Klebsiella oxytoca: NOT DETECTED
Klebsiella pneumoniae: NOT DETECTED
Listeria monocytogenes: NOT DETECTED
Neisseria meningitidis: NOT DETECTED
Proteus species: NOT DETECTED
Pseudomonas aeruginosa: NOT DETECTED
Serratia marcescens: NOT DETECTED
Staphylococcus aureus (BCID): NOT DETECTED
Staphylococcus species: NOT DETECTED
Streptococcus agalactiae: NOT DETECTED
Streptococcus pneumoniae: NOT DETECTED
Streptococcus pyogenes: NOT DETECTED
Streptococcus species: NOT DETECTED

## 2019-05-29 LAB — CBC WITH DIFFERENTIAL/PLATELET
Abs Immature Granulocytes: 0.08 10*3/uL — ABNORMAL HIGH (ref 0.00–0.07)
Basophils Absolute: 0 10*3/uL (ref 0.0–0.1)
Basophils Relative: 0 %
Eosinophils Absolute: 0 10*3/uL (ref 0.0–0.5)
Eosinophils Relative: 0 %
HCT: 31.2 % — ABNORMAL LOW (ref 39.0–52.0)
Hemoglobin: 11.4 g/dL — ABNORMAL LOW (ref 13.0–17.0)
Immature Granulocytes: 1 %
Lymphocytes Relative: 3 %
Lymphs Abs: 0.3 10*3/uL — ABNORMAL LOW (ref 0.7–4.0)
MCH: 32.1 pg (ref 26.0–34.0)
MCHC: 36.5 g/dL — ABNORMAL HIGH (ref 30.0–36.0)
MCV: 87.9 fL (ref 80.0–100.0)
Monocytes Absolute: 0.5 10*3/uL (ref 0.1–1.0)
Monocytes Relative: 6 %
Neutro Abs: 7.3 10*3/uL (ref 1.7–7.7)
Neutrophils Relative %: 90 %
Platelets: 74 10*3/uL — ABNORMAL LOW (ref 150–400)
RBC: 3.55 MIL/uL — ABNORMAL LOW (ref 4.22–5.81)
RDW: 12.3 % (ref 11.5–15.5)
WBC: 8.1 10*3/uL (ref 4.0–10.5)
nRBC: 0 % (ref 0.0–0.2)

## 2019-05-29 LAB — GLUCOSE, CAPILLARY
Glucose-Capillary: 112 mg/dL — ABNORMAL HIGH (ref 70–99)
Glucose-Capillary: 126 mg/dL — ABNORMAL HIGH (ref 70–99)
Glucose-Capillary: 135 mg/dL — ABNORMAL HIGH (ref 70–99)
Glucose-Capillary: 169 mg/dL — ABNORMAL HIGH (ref 70–99)
Glucose-Capillary: 186 mg/dL — ABNORMAL HIGH (ref 70–99)
Glucose-Capillary: 232 mg/dL — ABNORMAL HIGH (ref 70–99)
Glucose-Capillary: 250 mg/dL — ABNORMAL HIGH (ref 70–99)
Glucose-Capillary: 279 mg/dL — ABNORMAL HIGH (ref 70–99)
Glucose-Capillary: 299 mg/dL — ABNORMAL HIGH (ref 70–99)
Glucose-Capillary: 324 mg/dL — ABNORMAL HIGH (ref 70–99)
Glucose-Capillary: 365 mg/dL — ABNORMAL HIGH (ref 70–99)
Glucose-Capillary: 389 mg/dL — ABNORMAL HIGH (ref 70–99)
Glucose-Capillary: 417 mg/dL — ABNORMAL HIGH (ref 70–99)
Glucose-Capillary: 438 mg/dL — ABNORMAL HIGH (ref 70–99)
Glucose-Capillary: 451 mg/dL — ABNORMAL HIGH (ref 70–99)
Glucose-Capillary: 55 mg/dL — ABNORMAL LOW (ref 70–99)
Glucose-Capillary: 568 mg/dL (ref 70–99)
Glucose-Capillary: 589 mg/dL (ref 70–99)
Glucose-Capillary: 61 mg/dL — ABNORMAL LOW (ref 70–99)
Glucose-Capillary: 96 mg/dL (ref 70–99)
Glucose-Capillary: >600 mg/dL (ref 70–99)
Glucose-Capillary: >600 mg/dL (ref 70–99)
Glucose-Capillary: >600 mg/dL (ref 70–99)

## 2019-05-29 LAB — COMPREHENSIVE METABOLIC PANEL
ALT: 21 U/L (ref 0–44)
AST: 32 U/L (ref 15–41)
Albumin: 3.6 g/dL (ref 3.5–5.0)
Alkaline Phosphatase: 143 U/L — ABNORMAL HIGH (ref 38–126)
BUN: 65 mg/dL — ABNORMAL HIGH (ref 6–20)
CO2: <7 mmol/L — ABNORMAL LOW (ref 22–32)
Calcium: 7.9 mg/dL — ABNORMAL LOW (ref 8.9–10.3)
Chloride: 92 mmol/L — ABNORMAL LOW (ref 98–111)
Creatinine, Ser: 4.05 mg/dL — ABNORMAL HIGH (ref 0.61–1.24)
GFR calc Af Amer: 11 mL/min — ABNORMAL LOW (ref >60)
GFR calc non Af Amer: 10 mL/min — ABNORMAL LOW (ref >60)
Glucose, Bld: 1180 mg/dL (ref 70–99)
Potassium: 5.4 mmol/L — ABNORMAL HIGH (ref 3.5–5.1)
Sodium: 132 mmol/L — ABNORMAL LOW (ref 135–145)
Total Bilirubin: 1.9 mg/dL — ABNORMAL HIGH (ref 0.3–1.2)
Total Protein: 5.7 g/dL — ABNORMAL LOW (ref 6.5–8.1)

## 2019-05-29 LAB — CBC
HCT: 29.9 % — ABNORMAL LOW (ref 39.0–52.0)
Hemoglobin: 10.6 g/dL — ABNORMAL LOW (ref 13.0–17.0)
MCH: 32.2 pg (ref 26.0–34.0)
MCHC: 35.5 g/dL (ref 30.0–36.0)
MCV: 90.9 fL (ref 80.0–100.0)
Platelets: 80 10*3/uL — ABNORMAL LOW (ref 150–400)
RBC: 3.29 MIL/uL — ABNORMAL LOW (ref 4.22–5.81)
RDW: 12.4 % (ref 11.5–15.5)
WBC: 7.5 10*3/uL (ref 4.0–10.5)
nRBC: 0 % (ref 0.0–0.2)

## 2019-05-29 LAB — BLOOD GAS, ARTERIAL
Acid-base deficit: 20.4 mmol/L — ABNORMAL HIGH (ref 0.0–2.0)
Bicarbonate: 4.8 mmol/L — ABNORMAL LOW (ref 20.0–28.0)
FIO2: 0.21
O2 Saturation: 95.2 %
Patient temperature: 37
pH, Arterial: 7.21 — ABNORMAL LOW (ref 7.350–7.450)
pO2, Arterial: 93 mmHg (ref 83.0–108.0)

## 2019-05-29 LAB — BLOOD GAS, VENOUS
Patient temperature: 37
pCO2, Ven: 24 mmHg — ABNORMAL LOW (ref 44.0–60.0)
pH, Ven: <6.9 — CL (ref 7.250–7.430)
pO2, Ven: 42 mmHg (ref 32.0–45.0)

## 2019-05-29 LAB — URINE CULTURE: Culture: NO GROWTH

## 2019-05-29 LAB — LACTIC ACID, PLASMA: Lactic Acid, Venous: 2.1 mmol/L (ref 0.5–1.9)

## 2019-05-29 LAB — PHOSPHORUS: Phosphorus: <1 mg/dL — CL (ref 2.5–4.6)

## 2019-05-29 LAB — MAGNESIUM: Magnesium: 2.1 mg/dL (ref 1.7–2.4)

## 2019-05-29 MED ORDER — SODIUM CHLORIDE 0.9 % IV SOLN
0.0000 ug/min | INTRAVENOUS | Status: DC
  Filled 2019-05-29: qty 1

## 2019-05-29 MED ORDER — POTASSIUM CHLORIDE CRYS ER 20 MEQ PO TBCR: 40.0000 meq | EXTENDED_RELEASE_TABLET | ORAL | Status: DC

## 2019-05-29 MED ORDER — SODIUM BICARBONATE 8.4 % IV SOLN
INTRAVENOUS | Status: AC
  Administered 2019-05-29: 150 meq via INTRAVENOUS
  Filled 2019-05-29: qty 50

## 2019-05-29 MED ORDER — POTASSIUM CHLORIDE 10 MEQ/100ML IV SOLN
10.0000 meq | INTRAVENOUS | Status: AC
  Administered 2019-05-29 (×6): 10 meq via INTRAVENOUS
  Filled 2019-05-29 (×6): qty 100

## 2019-05-29 MED ORDER — SODIUM BICARBONATE 8.4 % IV SOLN
INTRAVENOUS | Status: AC
  Filled 2019-05-29: qty 50

## 2019-05-29 MED ORDER — POTASSIUM PHOSPHATES 15 MMOLE/5ML IV SOLN
30.0000 mmol | Freq: Once | INTRAVENOUS | Status: AC
  Administered 2019-05-29: 30 mmol via INTRAVENOUS
  Filled 2019-05-29: qty 10

## 2019-05-29 MED ORDER — DEXTROSE 50 % IV SOLN: 25.0000 mL | Freq: Once | INTRAVENOUS | Status: DC

## 2019-05-29 MED ORDER — INSULIN GLARGINE 100 UNIT/ML ~~LOC~~ SOLN
24.0000 [IU] | Freq: Once | SUBCUTANEOUS | Status: AC
  Administered 2019-05-29: 24 [IU] via SUBCUTANEOUS
  Filled 2019-05-29: qty 0.24

## 2019-05-29 MED ORDER — SODIUM CHLORIDE 0.9 % IV SOLN
1.0000 g | Freq: Two times a day (BID) | INTRAVENOUS | Status: DC
  Administered 2019-05-29 – 2019-05-30 (×3): 1 g via INTRAVENOUS
  Filled 2019-05-29 (×4): qty 1

## 2019-05-29 MED ORDER — SODIUM CHLORIDE 0.9 % IV SOLN
INTRAVENOUS | Status: DC
  Administered 2019-05-29: 01:00:00 via INTRAVENOUS

## 2019-05-29 MED ORDER — FLUCONAZOLE IN SODIUM CHLORIDE 200-0.9 MG/100ML-% IV SOLN
200.0000 mg | INTRAVENOUS | Status: DC
  Filled 2019-05-29: qty 100

## 2019-05-29 MED ORDER — FLUCONAZOLE IN SODIUM CHLORIDE 400-0.9 MG/200ML-% IV SOLN
800.0000 mg | Freq: Once | INTRAVENOUS | Status: DC
  Filled 2019-05-29: qty 400

## 2019-05-29 MED ORDER — INSULIN GLARGINE 100 UNIT/ML ~~LOC~~ SOLN
24.0000 [IU] | Freq: Every day | SUBCUTANEOUS | Status: DC
  Administered 2019-05-30 – 2019-06-03 (×5): 24 [IU] via SUBCUTANEOUS
  Filled 2019-05-29 (×7): qty 0.24

## 2019-05-29 MED ORDER — POTASSIUM CHLORIDE 2 MEQ/ML IV SOLN
INTRAVENOUS | Status: DC
  Administered 2019-05-29: 13:00:00 via INTRAVENOUS
  Filled 2019-05-29 (×2): qty 1000

## 2019-05-29 MED ORDER — SODIUM BICARBONATE 8.4 % IV SOLN
150.0000 meq | Freq: Once | INTRAVENOUS | Status: AC
  Administered 2019-05-29 (×2): 150 meq via INTRAVENOUS

## 2019-05-29 MED ORDER — FLUCONAZOLE IN SODIUM CHLORIDE 400-0.9 MG/200ML-% IV SOLN
800.0000 mg | Freq: Once | INTRAVENOUS | Status: AC
  Administered 2019-05-29: 800 mg via INTRAVENOUS
  Filled 2019-05-29 (×3): qty 400

## 2019-05-29 MED ORDER — PANTOPRAZOLE SODIUM 40 MG IV SOLR
40.0000 mg | Freq: Two times a day (BID) | INTRAVENOUS | Status: DC
  Administered 2019-05-29 – 2019-06-01 (×8): 40 mg via INTRAVENOUS
  Filled 2019-05-29 (×9): qty 40

## 2019-05-29 MED ORDER — INSULIN ASPART 100 UNIT/ML ~~LOC~~ SOLN
0.0000 [IU] | SUBCUTANEOUS | Status: DC
  Administered 2019-05-29 – 2019-05-30 (×2): 1 [IU] via SUBCUTANEOUS
  Administered 2019-05-30: 2 [IU] via SUBCUTANEOUS
  Administered 2019-05-30: 3 [IU] via SUBCUTANEOUS
  Administered 2019-05-30: 2 [IU] via SUBCUTANEOUS
  Administered 2019-05-30: 1 [IU] via SUBCUTANEOUS
  Administered 2019-05-30: 3 [IU] via SUBCUTANEOUS
  Administered 2019-05-31: 1 [IU] via SUBCUTANEOUS
  Administered 2019-05-31: 2 [IU] via SUBCUTANEOUS
  Administered 2019-05-31: 3 [IU] via SUBCUTANEOUS
  Administered 2019-05-31: 7 [IU] via SUBCUTANEOUS
  Administered 2019-06-01: 08:00:00 2 [IU] via SUBCUTANEOUS
  Administered 2019-06-01: 3 [IU] via SUBCUTANEOUS
  Filled 2019-05-29 (×12): qty 1

## 2019-05-29 MED ORDER — SODIUM CHLORIDE 0.9 % IV BOLUS
500.0000 mL | Freq: Once | INTRAVENOUS | Status: AC
  Administered 2019-05-29: 500 mL via INTRAVENOUS

## 2019-05-29 MED ORDER — POTASSIUM CHLORIDE 10 MEQ/100ML IV SOLN
10.0000 meq | INTRAVENOUS | Status: AC
  Administered 2019-05-29 (×4): 10 meq via INTRAVENOUS
  Filled 2019-05-29 (×4): qty 100

## 2019-05-29 NOTE — Progress Notes (Signed)
Pt admitted to ICU-15 for DKA. Patient is on room air, only responsive to voice, not alert or oriented. Pt has NS and IV Insulin gtt running. Report received from [place], [place]. Pt arrived via EMS as [facility], Sister [name] came to hospital and verified patient as Spencer [name]. Fluid bolus given for slight hypotension. Will continue to monitor.

## 2019-05-29 NOTE — Progress Notes (Signed)
[place] Progress Note Patient Name: Spencer [name] DOB: 27-Sep-1961 MRN: [id number]   Date of Service  05/29/2019  HPI/Events of Note  Pharmacy called with positive blood cultures for fungus in 2 out of 4 bottles.  eICU Interventions  Diflucan ordered per protocol        [name] 05/29/2019, 9:17 PM

## 2019-05-29 NOTE — Progress Notes (Signed)
Initial Nutrition Assessment  DOCUMENTATION CODES:   Underweight  INTERVENTION:   RD will add supplements once diet advanced  Pt at high refeed risk; recommend monitor K, Mg and P labs daily   NUTRITION DIAGNOSIS:   Inadequate oral intake related to acute illness as evidenced by NPO status.  GOAL:   Patient will meet greater than or equal to 90% of their needs  MONITOR:   Diet advancement, Labs, Weight trends, Skin, I & O's  REASON FOR ASSESSMENT:   Other (Comment)(low BMI)    ASSESSMENT:   58 yo male with a history of cocaine abuse admitted with acute encephalopathy secondary to Diabetic Ketoacidosis  Unable to speak with pt secondary to lethargy and AMS. Suspect pt with poor appetite and oral intake at baseline r/t substance abuse. Pt with nausea and vomiting today. There is no weight history in chart to determine if any significant weight changes. RD unable to do NFPE today as pt on contact precautions but patient appears severely malnourished through visualization from the doorway. RD will attempt to obtain nutrition related history and exam at a later date once appropriate. RD will order supplements once diet advanced; pt is currently NPO. Pt is at high refeed risk; recommend monitor K, Mg and P labs daily as pt on IV dextrose.   Medications reviewed and include: folic acid, heparin, insulin, MVI, Na Bicarbonate, thiamine, cefepime, 5% dextrose with KCl @100ml /hr, insulin, K Phos   Labs reviewed: Na 149(H), K 3.3(L), BUN 88(H), creat 3.60(H), P <1.0(L), Mg 2.1 wnl Hgb 10.6(L), Hct 29.9(L) cbgs- 417, 324, 299, 279, 250 x 24 hrs  Unable to complete Nutrition-Focused physical exam at this time.   Diet Order:   Diet Order            Diet NPO time specified  Diet effective now             EDUCATION NEEDS:   Not appropriate for education at this time  Skin:  Skin Assessment: Reviewed RN Assessment  Last BM:  7/23- TYPE 6  Height:   Ht Readings from Last 1  Encounters:  05/28/19 6\' 3"  (1.905 m)    Weight:   Wt Readings from Last 1 Encounters:  05/28/19 64.9 kg    Ideal Body Weight:  89 kg  BMI:  Body mass index is 17.88 kg/m.  Estimated Nutritional Needs:   Kcal:  2000-2300kcal/day  Protein:  100-115g/day  Fluid:  >2L/day  Koleen Distance MS, RD, LDN Pager #- [phone] Office#- [phone] After Hours Pager: [phone]

## 2019-05-29 NOTE — Progress Notes (Signed)
PHARMACY - PHYSICIAN COMMUNICATION CRITICAL VALUE ALERT - BLOOD CULTURE IDENTIFICATION (BCID)  Results for orders placed or performed during the hospital encounter of 05/28/19  Blood Culture ID Panel (Reflexed) (Collected: 05/28/2019  4:38 PM)  Result Value Ref Range   Enterococcus species NOT DETECTED NOT DETECTED   Listeria monocytogenes NOT DETECTED NOT DETECTED   Staphylococcus species NOT DETECTED NOT DETECTED   Staphylococcus aureus (BCID) NOT DETECTED NOT DETECTED   Streptococcus species NOT DETECTED NOT DETECTED   Streptococcus agalactiae NOT DETECTED NOT DETECTED   Streptococcus pneumoniae NOT DETECTED NOT DETECTED   Streptococcus pyogenes NOT DETECTED NOT DETECTED   Acinetobacter baumannii NOT DETECTED NOT DETECTED   Enterobacteriaceae species NOT DETECTED NOT DETECTED   Enterobacter cloacae complex NOT DETECTED NOT DETECTED   Escherichia coli NOT DETECTED NOT DETECTED   Klebsiella oxytoca NOT DETECTED NOT DETECTED   Klebsiella pneumoniae NOT DETECTED NOT DETECTED   Proteus species NOT DETECTED NOT DETECTED   Serratia marcescens NOT DETECTED NOT DETECTED   Haemophilus influenzae NOT DETECTED NOT DETECTED   Neisseria meningitidis NOT DETECTED NOT DETECTED   Pseudomonas aeruginosa NOT DETECTED NOT DETECTED   Candida albicans DETECTED (A) NOT DETECTED   Candida glabrata NOT DETECTED NOT DETECTED   Candida krusei NOT DETECTED NOT DETECTED   Candida parapsilosis NOT DETECTED NOT DETECTED   Candida tropicalis NOT DETECTED NOT DETECTED    Name of physician (or Provider) Contacted:  Ogan (E-link)   Changes to prescribed antibiotics required: Yes,  Will start Fluconazole 800 mg IV X 1 followed by fluconazole 200 mg IV Q24H.   Yordy Matton D 05/29/2019  9:13 PM

## 2019-05-29 NOTE — Progress Notes (Signed)
Inpatient Diabetes Program Recommendations  AACE/ADA: New Consensus Statement on Inpatient Glycemic Control (2015)  Target Ranges:  Prepandial:   less than 140 mg/dL      Peak postprandial:   less than 180 mg/dL (1-2 hours)      Critically ill patients:  140 - 180 mg/dL   Lab Results  Component Value Date   GLUCAP 365 (H) 05/29/2019   Patient admitted with blood glucose> 1000 mg/dL Diabetes history: None noted Outpatient Diabetes medications: None noted Current orders for Inpatient glycemic control:  IV insulin/DKA order set  Inpatient Diabetes Program Recommendations:    Continue IV insulin, fluids, DKA order set until AG<12 and/orCO2>20. No medications or DM history listed.  Will need to evaluate further once patient is alert and oriented.  Agree with current orders. Will follow.  Thanks,  Adah Perl, RN, BC-ADM Inpatient Diabetes Coordinator Pager [phone] (8a-5p)

## 2019-05-29 NOTE — Progress Notes (Signed)
Dr. Leslye Peer notified pt throwing up dark colored vomit at this time. MD is at bedside assessing patient. MD says he will let Dr. Alva Garnet know.
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
## 2019-05-29 NOTE — Consult Note (Signed)
PHARMACY CONSULT NOTE - FOLLOW UP  Pharmacy Consult for Electrolyte Monitoring and Replacement   Recent Labs: Potassium (mmol/L)  Date Value  05/29/2019 3.4 (L)   Magnesium (mg/dL)  Date Value  05/29/2019 2.1   Calcium (mg/dL)  Date Value  05/29/2019 7.5 (L)   Albumin (g/dL)  Date Value  05/28/2019 3.6   Phosphorus (mg/dL)  Date Value  05/29/2019 <1.0 (LL)   Sodium (mmol/L)  Date Value  05/29/2019 149 (H)   Assessment: Pharmacy consulted for electrolyte monitoring and replacement in 58 yo male admitted with DKA.  Goal of Therapy:  Electrolytes WNL K: ~4 Mg: ~2  Plan:  7/23 AM: KCL 80mEq IV x 4 doses and KPhos 30 mmol IV x 1 dose already ordered.   BMPs ordered every 4 hours.    Pharmacy will continue to follow and replace electrolytes as needed.   Pernell Dupre, PharmD, BCPS Clinical Pharmacist 05/29/2019 6:37 AM

## 2019-05-29 NOTE — Consult Note (Signed)
[place] for Electrolyte Monitoring and Replacement   Recent Labs: Potassium (mmol/L)  Date Value  05/29/2019 3.2 (L)   Magnesium (mg/dL)  Date Value  05/29/2019 2.1   Calcium (mg/dL)  Date Value  05/29/2019 7.8 (L)   Albumin (g/dL)  Date Value  05/28/2019 3.6   Phosphorus (mg/dL)  Date Value  05/29/2019 <1.0 (LL)   Sodium (mmol/L)  Date Value  05/29/2019 150 (H)   Assessment: Pharmacy consulted for electrolyte monitoring and replacement in 58 yo male admitted with DKA.  Goal of Therapy:  Electrolytes WNL K: ~4 Mg: ~2  Plan:  7/23 1500 Pt to transition to SQ insulin now that AG closed. Patient with two charts since he came in as Spencer [name]. Per review of other chart, pt here for 7th time since December for DKA. He was discharged on Lantus 24 units daily on last admission in April. Unclear if he has been compliant. Spoke with diabetes coordinator. Will give Lantus 24 units and turn insulin drip off 1-2 hours after administration of Lantus. RN to discontinue order for insulin drip once she turns it off.  K 3.2 Will supplement with potassium 10 mEq IV x 6 runs as patient is unable to take oral medications.   Electrolytes ordered with morning labs.  Pharmacy will continue to follow and replace electrolytes as needed.   Tawnya Crook, PharmD Clinical Pharmacist 05/29/2019 3:28 PM

## 2019-05-29 NOTE — Progress Notes (Signed)
Pharmacy Antibiotic Note  Spencer [name] is a 58 y.o. male admitted on 05/28/2019 with candidemia.  Pharmacy has been consulted for fluconazole dosing.  Plan: Fluconazole 800 mg IV X 1 ordered for 7/23 followed by fluconazole 200 mg IV Q24H to start on 7/24.   Height: 6\' 3"  (190.5 cm) Weight: 143 lb 1.3 oz (64.9 kg) IBW/kg (Calculated) : 84.5  Temp (24hrs), Avg:97.8 F (36.6 C), Min:93.9 F (34.4 C), Max:99.1 F (37.3 C)  Recent Labs  Lab 05/28/19 1638 05/28/19 2124 05/29/19 0140 05/29/19 0526 05/29/19 0926 05/29/19 1343 05/29/19 1344  WBC 24.6*  --   --  7.5  --   --  8.1  CREATININE 4.05* 3.99* 3.79* 3.68* 3.60* 3.30*  --   LATICACIDVEN 3.7* 2.1* 2.1*  --   --   --   --     Estimated Creatinine Clearance: 21.9 mL/min (A) (by C-G formula based on SCr of 3.3 mg/dL (H)).    No Known Allergies  Antimicrobials this admission:   >>    >>   Dose adjustments this admission:   Microbiology results:   BCx:  Candida albicans in 2 of 4 bottles (7/23)  UCx:    Sputum:    MRSA PCR:   Thank you for allowing pharmacy to be a part of this patient's care.  Izaiha Lo D 05/29/2019 9:19 PM

## 2019-05-29 NOTE — Plan of Care (Signed)
  Problem: Cardiac: Goal: Ability to maintain an adequate cardiac output will improve Outcome: Progressing   Problem: Fluid Volume: Goal: Ability to achieve a balanced intake and output will improve Outcome: Progressing   Problem: Metabolic: Goal: Ability to maintain appropriate glucose levels will improve Outcome: Progressing Note: Pts blood sugars have decreased from 900s to 400s with an insulin gtt   Problem: Respiratory: Goal: Will regain and/or maintain adequate ventilation Outcome: Progressing   Problem: Urinary Elimination: Goal: Ability to achieve and maintain adequate renal perfusion and functioning will improve Outcome: Progressing   Problem: Education: Goal: Ability to describe self-care measures that may prevent or decrease complications (Diabetes Survival Skills Education) will improve Outcome: Not Met (add Reason)   Problem: Health Behavior/Discharge Planning: Goal: Ability to identify and utilize available resources and services will improve Outcome: Not Met (add Reason) Goal: Ability to manage health-related needs will improve Outcome: Not Met (add Reason)   Problem: Nutritional: Goal: Maintenance of adequate nutrition will improve Outcome: Not Met (add Reason) Note: Pt remains NPO

## 2019-05-30 ENCOUNTER — Inpatient Hospital Stay: Payer: Medicaid Other

## 2019-05-30 DIAGNOSIS — D696 Thrombocytopenia, unspecified: Secondary | ICD-10-CM

## 2019-05-30 DIAGNOSIS — Z8639 Personal history of other endocrine, nutritional and metabolic disease: Secondary | ICD-10-CM

## 2019-05-30 DIAGNOSIS — N179 Acute kidney failure, unspecified: Secondary | ICD-10-CM

## 2019-05-30 DIAGNOSIS — B377 Candidal sepsis: Principal | ICD-10-CM

## 2019-05-30 DIAGNOSIS — Z9114 Patient's other noncompliance with medication regimen: Secondary | ICD-10-CM

## 2019-05-30 DIAGNOSIS — F1910 Other psychoactive substance abuse, uncomplicated: Secondary | ICD-10-CM

## 2019-05-30 DIAGNOSIS — E1110 Type 2 diabetes mellitus with ketoacidosis without coma: Secondary | ICD-10-CM

## 2019-05-30 LAB — BASIC METABOLIC PANEL
Anion gap: 6 (ref 5–15)
BUN: 82 mg/dL — ABNORMAL HIGH (ref 6–20)
CO2: 17 mmol/L — ABNORMAL LOW (ref 22–32)
Calcium: 8.5 mg/dL — ABNORMAL LOW (ref 8.9–10.3)
Chloride: 128 mmol/L — ABNORMAL HIGH (ref 98–111)
Creatinine, Ser: 3.12 mg/dL — ABNORMAL HIGH (ref 0.61–1.24)
GFR calc Af Amer: 24 mL/min — ABNORMAL LOW (ref >60)
GFR calc non Af Amer: 21 mL/min — ABNORMAL LOW (ref >60)
Glucose, Bld: 151 mg/dL — ABNORMAL HIGH (ref 70–99)
Potassium: 3.8 mmol/L (ref 3.5–5.1)
Sodium: 151 mmol/L — ABNORMAL HIGH (ref 135–145)

## 2019-05-30 LAB — CBC
HCT: 32.5 % — ABNORMAL LOW (ref 39.0–52.0)
Hemoglobin: 11.7 g/dL — ABNORMAL LOW (ref 13.0–17.0)
MCH: 31.6 pg (ref 26.0–34.0)
MCHC: 36 g/dL (ref 30.0–36.0)
MCV: 87.8 fL (ref 80.0–100.0)
Platelets: 71 10*3/uL — ABNORMAL LOW (ref 150–400)
RBC: 3.7 MIL/uL — ABNORMAL LOW (ref 4.22–5.81)
RDW: 13 % (ref 11.5–15.5)
WBC: 10 10*3/uL (ref 4.0–10.5)
nRBC: 0 % (ref 0.0–0.2)

## 2019-05-30 LAB — HEMOGLOBIN A1C
Hgb A1c MFr Bld: 13.5 % — ABNORMAL HIGH (ref 4.8–5.6)
Hgb A1c MFr Bld: 13.8 % — ABNORMAL HIGH (ref 4.8–5.6)
Mean Plasma Glucose: 341 mg/dL
Mean Plasma Glucose: 349.36 mg/dL

## 2019-05-30 LAB — GLUCOSE, CAPILLARY
Glucose-Capillary: 128 mg/dL — ABNORMAL HIGH (ref 70–99)
Glucose-Capillary: 129 mg/dL — ABNORMAL HIGH (ref 70–99)
Glucose-Capillary: 142 mg/dL — ABNORMAL HIGH (ref 70–99)
Glucose-Capillary: 158 mg/dL — ABNORMAL HIGH (ref 70–99)
Glucose-Capillary: 177 mg/dL — ABNORMAL HIGH (ref 70–99)
Glucose-Capillary: 201 mg/dL — ABNORMAL HIGH (ref 70–99)
Glucose-Capillary: 228 mg/dL — ABNORMAL HIGH (ref 70–99)

## 2019-05-30 LAB — PROTIME-INR
INR: 1 (ref 0.8–1.2)
Prothrombin Time: 13.3 seconds (ref 11.4–15.2)

## 2019-05-30 LAB — MAGNESIUM: Magnesium: 1.8 mg/dL (ref 1.7–2.4)

## 2019-05-30 LAB — PHOSPHORUS: Phosphorus: 1.6 mg/dL — ABNORMAL LOW (ref 2.5–4.6)

## 2019-05-30 LAB — PATHOLOGIST SMEAR REVIEW

## 2019-05-30 MED ORDER — SODIUM CHLORIDE 0.9 % IV SOLN: 100.0000 mg | INTRAVENOUS | Status: DC

## 2019-05-30 MED ORDER — FLUCONAZOLE IN SODIUM CHLORIDE 200-0.9 MG/100ML-% IV SOLN
200.0000 mg | INTRAVENOUS | Status: DC
  Administered 2019-05-30 – 2019-06-03 (×5): 200 mg via INTRAVENOUS
  Filled 2019-05-30 (×7): qty 100

## 2019-05-30 MED ORDER — SODIUM CHLORIDE 0.9 % IV SOLN
200.0000 mg | Freq: Once | INTRAVENOUS | Status: DC
  Filled 2019-05-30: qty 200

## 2019-05-30 MED ORDER — SODIUM BICARBONATE 8.4 % IV SOLN
INTRAVENOUS | Status: DC
  Administered 2019-05-30 (×2): via INTRAVENOUS
  Filled 2019-05-30 (×4): qty 50

## 2019-05-30 MED ORDER — MAGNESIUM SULFATE IN D5W 1-5 GM/100ML-% IV SOLN
1.0000 g | Freq: Once | INTRAVENOUS | Status: AC
  Administered 2019-05-30: 1 g via INTRAVENOUS
  Filled 2019-05-30: qty 100

## 2019-05-30 MED ORDER — POTASSIUM PHOSPHATES 15 MMOLE/5ML IV SOLN
10.0000 mmol | Freq: Once | INTRAVENOUS | Status: AC
  Administered 2019-05-30: 10 mmol via INTRAVENOUS
  Filled 2019-05-30: qty 3.33
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
## 2019-05-30 NOTE — Progress Notes (Signed)
CRITICAL CARE PROGRESS NOTE       SUBJECTIVE FINDINGS & SIGNIFICANT EVENTS   Patient remains critically ill, DKA is improved Requiring sedation due to drug withdrawal sydnrome Cadndida bacteremia being treated with IV fluconazole , ID evaluation today.    PAST MEDICAL HISTORY   No past medical history on file.   SURGICAL HISTORY   Unable to obtain surgical history   FAMILY HISTORY   No family history on file.   SOCIAL HISTORY   Social History   Tobacco Use  . Smoking status: Not on file  Substance Use Topics  . Alcohol use: Not on file  . Drug use: Not on file     MEDICATIONS   Current Medication:  Current Facility-Administered Medications:  .  acetaminophen (TYLENOL) tablet 650 mg, 650 mg, Oral, Q6H PRN **OR** acetaminophen (TYLENOL) suppository 650 mg, 650 mg, Rectal, Q6H PRN, Epifanio Lesches, MD, 650 mg at 05/29/19 2014 .  Chlorhexidine Gluconate Cloth 2 % PADS 6 each, 6 each, Topical, Daily, Awilda Bill, NP, 6 each at 05/30/19 1611 .  dextrose 50 % solution 25 mL, 25 mL, Intravenous, PRN, Merlyn Lot, MD, 25 mL at 05/29/19 2354 .  dextrose 50 % solution 25 mL, 25 mL, Intravenous, Once, Blakeney, Dreama Saa, NP .  fluconazole (DIFLUCAN) IVPB 200 mg, 200 mg, Intravenous, Q24H, Avionna Bower, MD .  folic acid (FOLVITE) tablet 1 mg, 1 mg, Oral, Daily, Blakeney, Dana G, NP .  insulin aspart (novoLOG) injection 0-9 Units, 0-9 Units, Subcutaneous, Q4H, Flora Lipps, MD, 2 Units at 05/30/19 1630 .  insulin glargine (LANTUS) injection 24 Units, 24 Units, Subcutaneous, QHS, Kasa, Kurian, MD .  LORazepam (ATIVAN) tablet 1 mg, 1 mg, Oral, Q6H PRN **OR** LORazepam (ATIVAN) injection 1 mg, 1 mg, Intravenous, Q6H PRN, Awilda Bill, NP, 1 mg at 05/30/19 1400 .  multivitamin with  minerals tablet 1 tablet, 1 tablet, Oral, Daily, Blakeney, Dreama Saa, NP .  ondansetron (ZOFRAN) tablet 4 mg, 4 mg, Oral, Q6H PRN **OR** ondansetron (ZOFRAN) injection 4 mg, 4 mg, Intravenous, Q6H PRN, Epifanio Lesches, MD, 4 mg at 05/30/19 1700 .  pantoprazole (PROTONIX) injection 40 mg, 40 mg, Intravenous, Q12H, Wieting, Richard, MD, 40 mg at 05/30/19 1026 .  sodium bicarbonate 50 mEq in dextrose 5 % 1,000 mL infusion, , Intravenous, Continuous, Lateef, Munsoor, MD, Last Rate: 75 mL/hr at 05/30/19 0930 .  thiamine (VITAMIN B-1) tablet 100 mg, 100 mg, Oral, Daily **OR** thiamine (B-1) injection 100 mg, 100 mg, Intravenous, Daily, Blakeney, Dreama Saa, NP, 100 mg at 05/30/19 1026    ALLERGIES   Patient has no known allergies.    REVIEW OF SYSTEMS     10 point ROS unable to obtain due to sedation  PHYSICAL EXAMINATION   Vitals:   05/30/19 1500 05/30/19 1600  BP: 124/79 130/90  Pulse: 86 81  Resp: 19 15  Temp: (!) 97.5 F (36.4 C)   SpO2:  99%    GENERAL: Sedated HEAD: Normocephalic, atraumatic.  EYES: Pupils equal, round, reactive to light.  No scleral icterus.  MOUTH: Moist mucosal membrane. NECK: Supple. No thyromegaly. No nodules. No JVD.  PULMONARY: Clear to auscultation bilaterally CARDIOVASCULAR: S1 and S2. Regular rate and rhythm. No murmurs, rubs, or gallops.  GASTROINTESTINAL: Soft, nontender, non-distended. No masses. Positive bowel sounds. No hepatosplenomegaly.  MUSCULOSKELETAL: No swelling, clubbing, or edema.  NEUROLOGIC: Mild distress due to acute illness SKIN:intact,warm,dry   LABS AND IMAGING       LAB RESULTS:  Recent Labs  Lab 05/29/19 0926 05/29/19 1343 05/30/19 0446  NA 149* 150* 151*  K 3.3* 3.2* 3.8  CL 123* 125* 128*  CO2 14* 16* 17*  BUN 88* 88* 82*  CREATININE 3.60* 3.30* 3.12*  GLUCOSE 349* 253* 151*   Recent Labs  Lab 05/29/19 0526 05/29/19 1344 05/30/19 0446  HGB 10.6* 11.4* 11.7*  HCT 29.9* 31.2* 32.5*  WBC 7.5 8.1 10.0   PLT 80* 74* 71*     IMAGING RESULTS: [place] Renal  Result Date: 05/30/2019 CLINICAL DATA:  Acute renal failure. EXAM: RENAL / URINARY TRACT ULTRASOUND COMPLETE COMPARISON:  None. FINDINGS: Right Kidney: Renal measurements: 12.5 x 4.8 x 5.9 cm = volume: 186 mL . Echogenicity within normal limits. No mass or hydronephrosis visualized. Left Kidney: Renal measurements: 11.7 x 6.2 x 5.6 cm = volume: 211 mL. Normal parenchymal echogenicity. Cyst arises from the lower pole measuring 1.6 cm. No other masses, no stones and no hydronephrosis. Bladder: Collapsed by a Foley catheter. IMPRESSION: 1. No acute findings. No hydronephrosis. Normal renal parenchymal echogenicity. 2. 16 mm left renal cyst.  No other abnormalities. Electronically Signed   By: Amie Portlandavid  Ormond M.D.   On: 05/30/2019 11:37      ASSESSMENT AND PLAN    -Multidisciplinary rounds held today  Diabetic ketoacidosis -Improved status post phase 1 and 2 -Currently sedated not on oral diet   Candida bacteremia -Continue with IV Diflucan -ID on case appreciate input ICU monitoring  Renal Failure-most likely due to ATN -follow chem 7 -follow UO -continue Foley Catheter-assess need daily   NEUROLOGY - sedated due to drug withdrawal - minimal sedation to achieve a RASS goal: -1 Wake up assessment pendings   ID -continue IV abx as prescibed -follow up cultures  GI/Nutrition GI PROPHYLAXIS as indicated DIET-->TF's as tolerated Constipation protocol as indicated  ENDO - ICU hypoglycemic\Hyperglycemia protocol -check FSBS per protocol   ELECTROLYTES -follow labs as needed -replace as needed -pharmacy consultation   DVT/GI PRX ordered -SCDs  TRANSFUSIONS AS NEEDED MONITOR FSBS ASSESS the need for LABS as needed   Critical care provider statement:    Critical care time (minutes):  33   Critical care time was exclusive of:  Separately billable procedures and treating other patients   Critical care was necessary to  treat or prevent imminent or life-threatening deterioration of the following conditions:   Diabetic ketoacidosis, unresponsive state due to drug overdose, AKI stage II, Candida bacteremia, multiple comorbid conditions   Critical care was time spent personally by me on the following activities:  Development of treatment plan with patient or surrogate, discussions with consultants, evaluation of patient's response to treatment, examination of patient, obtaining history from patient or surrogate, ordering and performing treatments and interventions, ordering and review of laboratory studies and re-evaluation of patient's condition.  I assumed direction of critical care for this patient from another provider in my specialty: no    This document was prepared using Dragon voice recognition software and may include unintentional dictation errors.    Vida RiggerFuad Kamil Mchaffie, M.D.  Division of Pulmonary & Critical Care Medicine  [facility]

## 2019-05-30 NOTE — Progress Notes (Signed)
Inpatient Diabetes Program Recommendations  AACE/ADA: New Consensus Statement on Inpatient Glycemic Control   Target Ranges:  Prepandial:   less than 140 mg/dL      Peak postprandial:   less than 180 mg/dL (1-2 hours)      Critically ill patients:  140 - 180 mg/dL  Results for Spencer [name], Spencer [name] (MRN [id number]) as of 05/30/2019 08:04  Ref. Range 05/29/2019 17:18 05/29/2019 18:29 05/29/2019 21:01 05/29/2019 23:43 05/29/2019 23:45 05/30/2019 00:41 05/30/2019 04:05  Glucose-Capillary Latest Ref Range: 70 - 99 mg/dL 112 (H)  Lantus 24 units @ 17:14 96 126 (H)  Novolog 1 unit 61 (L) 55 (L) 128 (H) 129 (H)  Novolog 1 unit   Results for Spencer [name], Spencer [name] (MRN [id number]) as of 05/30/2019 08:04  Ref. Range 05/28/2019 16:38  Glucose Latest Ref Range: 70 - 99 mg/dL 1,180 (HH)  Hemoglobin A1C Latest Ref Range: 4.8 - 5.6 % 13.5 (H)   Review of Glycemic Control  Diabetes history: DM2 Outpatient Diabetes medications: Per merge chart (MRN [id number]) last hospital discharge on 02/19/19 was prescribed Lantus 24 units daily, Novolog 6 units TID with meals Current orders for Inpatient glycemic control: Lantus 24 units QHS, Novolog 0-9 units Q4H  Inpatient Diabetes Program Recommendations:  Insulin-Basal: Please consider decreasing Lantus slightly to 22 units QHS.  Insulin-Correction: Anticipate noted hypoglycemia at 23:43 on 05/29/19 was due to Novolog correction. If patient experiences any other issues with hypoglycemia, may need to decrease Novolog correction to a custom scale (such as Novolog 0-5 units Q4H).   HbgA1C:  A1C 13.5% on 05/28/19 indicating an average glucose of 341 mg/dl over the past 2-3 months.   NOTE: Noted consult for Inpatient Diabetes Coordinator. Noted merged chart (MRN [id number]). Patient is known to inpatient diabetes team and was last seen by our team on 02/08/19 during prior hospitalization. Patient was last in the hospital 02/18/19 to 02/19/19 and was discharged on Lantus 24 units daily and Novolog 6  units TID with meals for DM control. Per assessment in current chart, patient is still confused. Noted patient has Medicaid and patient should be able to get insulins for $4 copay. Will continue to follow along.  Thanks, Barnie Alderman, RN, MSN, CDE Diabetes Coordinator Inpatient Diabetes Program [phone] (Team Pager from 8am to 5pm)

## 2019-05-30 NOTE — Consult Note (Signed)
[place] for Electrolyte Monitoring and Replacement   Recent Labs: Potassium (mmol/L)  Date Value  05/30/2019 3.8   Magnesium (mg/dL)  Date Value  05/30/2019 1.8   Calcium (mg/dL)  Date Value  05/30/2019 8.5 (L)   Albumin (g/dL)  Date Value  05/28/2019 3.6   Phosphorus (mg/dL)  Date Value  05/30/2019 1.6 (L)   Sodium (mmol/L)  Date Value  05/30/2019 151 (H)   Assessment: Pharmacy consulted for electrolyte monitoring and replacement in 58 yo male admitted with DKA.  Goal of Therapy:  Electrolytes WNL K: ~4 Mg: ~2 Phos: ~2.5  Plan:  Will replace phosphorus with potassium phosphate 10 mmol IV x 1. Will also give magnesium 1 g IV x 1.  Electrolytes ordered with morning labs.  Pharmacy will continue to follow and replace electrolytes as needed.   Tawnya Crook, PharmD Clinical Pharmacist 05/30/2019 7:43 AM
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
## 2019-05-31 ENCOUNTER — Inpatient Hospital Stay
Admit: 2019-05-31 | Discharge: 2019-05-31 | Disposition: A | Discharge status: Home / Self Care | Payer: Medicaid Other | Attending: Infectious Diseases | Admitting: Infectious Diseases

## 2019-05-31 LAB — CBC
HCT: 28.1 % — ABNORMAL LOW (ref 39.0–52.0)
Hemoglobin: 10 g/dL — ABNORMAL LOW (ref 13.0–17.0)
MCH: 31.5 pg (ref 26.0–34.0)
MCHC: 35.6 g/dL (ref 30.0–36.0)
MCV: 88.6 fL (ref 80.0–100.0)
Platelets: 62 10*3/uL — ABNORMAL LOW (ref 150–400)
RBC: 3.17 MIL/uL — ABNORMAL LOW (ref 4.22–5.81)
RDW: 13.6 % (ref 11.5–15.5)
WBC: 11.5 10*3/uL — ABNORMAL HIGH (ref 4.0–10.5)
nRBC: 0 % (ref 0.0–0.2)

## 2019-05-31 LAB — PHOSPHORUS: Phosphorus: 1.8 mg/dL — ABNORMAL LOW (ref 2.5–4.6)

## 2019-05-31 LAB — GLUCOSE, CAPILLARY
Glucose-Capillary: 130 mg/dL — ABNORMAL HIGH (ref 70–99)
Glucose-Capillary: 137 mg/dL — ABNORMAL HIGH (ref 70–99)
Glucose-Capillary: 167 mg/dL — ABNORMAL HIGH (ref 70–99)
Glucose-Capillary: 184 mg/dL — ABNORMAL HIGH (ref 70–99)
Glucose-Capillary: 202 mg/dL — ABNORMAL HIGH (ref 70–99)
Glucose-Capillary: 342 mg/dL — ABNORMAL HIGH (ref 70–99)
Glucose-Capillary: 81 mg/dL (ref 70–99)

## 2019-05-31 LAB — BASIC METABOLIC PANEL
Anion gap: 7 (ref 5–15)
BUN: 63 mg/dL — ABNORMAL HIGH (ref 6–20)
CO2: 19 mmol/L — ABNORMAL LOW (ref 22–32)
Calcium: 8.9 mg/dL (ref 8.9–10.3)
Chloride: 126 mmol/L — ABNORMAL HIGH (ref 98–111)
Creatinine, Ser: 2.89 mg/dL — ABNORMAL HIGH (ref 0.61–1.24)
GFR calc Af Amer: 26 mL/min — ABNORMAL LOW (ref >60)
GFR calc non Af Amer: 23 mL/min — ABNORMAL LOW (ref >60)
Glucose, Bld: 190 mg/dL — ABNORMAL HIGH (ref 70–99)
Potassium: 3.4 mmol/L — ABNORMAL LOW (ref 3.5–5.1)
Sodium: 152 mmol/L — ABNORMAL HIGH (ref 135–145)

## 2019-05-31 LAB — C4 COMPLEMENT: Complement C4, Body Fluid: 11 mg/dL — ABNORMAL LOW (ref 14–44)

## 2019-05-31 LAB — ANA W/REFLEX IF POSITIVE: Anti Nuclear Antibody (ANA): NEGATIVE

## 2019-05-31 LAB — MAGNESIUM: Magnesium: 2.2 mg/dL (ref 1.7–2.4)

## 2019-05-31 LAB — C3 COMPLEMENT: C3 Complement: 65 mg/dL — ABNORMAL LOW (ref 82–167)

## 2019-05-31 MED ORDER — POTASSIUM PHOSPHATES 15 MMOLE/5ML IV SOLN
30.0000 mmol | Freq: Once | INTRAVENOUS | Status: AC
  Administered 2019-05-31: 30 mmol via INTRAVENOUS
  Filled 2019-05-31: qty 10

## 2019-05-31 MED ORDER — DEXTROSE 5 % IV SOLN
INTRAVENOUS | Status: DC
  Administered 2019-05-31: 12:00:00 via INTRAVENOUS

## 2019-05-31 NOTE — Progress Notes (Signed)
CRITICAL CARE PROGRESS NOTE       SUBJECTIVE FINDINGS & SIGNIFICANT EVENTS   Patient clinically improved. DKA is improved Drug/alcohol withdrawal syndrome improved, sedation stopped  Cadndida bacteremia being treated with IV fluconazole , ID evaluation today.  Optimizing for downgrade to medical floor   PAST MEDICAL HISTORY   No past medical history on file.   SURGICAL HISTORY   Unable to obtain surgical history   FAMILY HISTORY   No family history on file.   SOCIAL HISTORY   Social History   Tobacco Use  . Smoking status: Not on file  Substance Use Topics  . Alcohol use: Not on file  . Drug use: Not on file     MEDICATIONS   Current Medication:  Current Facility-Administered Medications:  .  acetaminophen (TYLENOL) tablet 650 mg, 650 mg, Oral, Q6H PRN **OR** acetaminophen (TYLENOL) suppository 650 mg, 650 mg, Rectal, Q6H PRN, Epifanio Lesches, MD, 650 mg at 05/29/19 2014 .  Chlorhexidine Gluconate Cloth 2 % PADS 6 each, 6 each, Topical, Daily, Awilda Bill, NP, 6 each at 05/30/19 1611 .  dextrose 50 % solution 25 mL, 25 mL, Intravenous, PRN, Merlyn Lot, MD, 25 mL at 05/29/19 2354 .  dextrose 50 % solution 25 mL, 25 mL, Intravenous, Once, Awilda Bill, NP .  fluconazole (DIFLUCAN) IVPB 200 mg, 200 mg, Intravenous, Q24H, Hanah Moultry, MD, Last Rate: 100 mL/hr at 05/30/19 2216, 200 mg at 05/30/19 2216 .  folic acid (FOLVITE) tablet 1 mg, 1 mg, Oral, Daily, Blakeney, Dana G, NP .  insulin aspart (novoLOG) injection 0-9 Units, 0-9 Units, Subcutaneous, Q4H, Flora Lipps, MD, 1 Units at 05/31/19 0358 .  insulin glargine (LANTUS) injection 24 Units, 24 Units, Subcutaneous, QHS, Flora Lipps, MD, 24 Units at 05/30/19 2121 .  LORazepam (ATIVAN) tablet 1 mg, 1 mg, Oral, Q6H PRN  **OR** LORazepam (ATIVAN) injection 1 mg, 1 mg, Intravenous, Q6H PRN, Awilda Bill, NP, 1 mg at 05/30/19 1400 .  multivitamin with minerals tablet 1 tablet, 1 tablet, Oral, Daily, Blakeney, Dreama Saa, NP .  ondansetron (ZOFRAN) tablet 4 mg, 4 mg, Oral, Q6H PRN **OR** ondansetron (ZOFRAN) injection 4 mg, 4 mg, Intravenous, Q6H PRN, Epifanio Lesches, MD, 4 mg at 05/30/19 1700 .  pantoprazole (PROTONIX) injection 40 mg, 40 mg, Intravenous, Q12H, Loletha Grayer, MD, 40 mg at 05/30/19 2121 .  potassium PHOSPHATE 30 mmol in dextrose 5 % 500 mL infusion, 30 mmol, Intravenous, Once, Tukov-Yual, Magdalene S, NP, Last Rate: 85 mL/hr at 05/31/19 0323, 30 mmol at 05/31/19 0323 .  sodium bicarbonate 50 mEq in dextrose 5 % 1,000 mL infusion, , Intravenous, Continuous, Lateef, Munsoor, MD, Last Rate: 75 mL/hr at 05/31/19 0200 .  thiamine (VITAMIN B-1) tablet 100 mg, 100 mg, Oral, Daily **OR** thiamine (B-1) injection 100 mg, 100 mg, Intravenous, Daily, Blakeney, Dreama Saa, NP, 100 mg at 05/30/19 1026    ALLERGIES   Patient has no known allergies.    REVIEW OF SYSTEMS     10 point ROS unable to obtain due to sedation  PHYSICAL EXAMINATION   Vitals:   05/31/19 0500 05/31/19 0600  BP:  116/80  Pulse: 71 77  Resp: 15 15  Temp:    SpO2: 97% 97%    GENERAL: Sedated HEAD: Normocephalic, atraumatic.  EYES: Pupils equal, round, reactive to light.  No scleral icterus.  MOUTH: Moist mucosal membrane. NECK: Supple. No thyromegaly. No nodules. No JVD.  PULMONARY: Clear to auscultation bilaterally CARDIOVASCULAR: S1 and  S2. Regular rate and rhythm. No murmurs, rubs, or gallops.  GASTROINTESTINAL: Soft, nontender, non-distended. No masses. Positive bowel sounds. No hepatosplenomegaly.  MUSCULOSKELETAL: No swelling, clubbing, or edema.  NEUROLOGIC: Mild distress due to acute illness SKIN:intact,warm,dry   LABS AND IMAGING       LAB RESULTS: Recent Labs  Lab 05/29/19 1343 05/30/19 0446  05/31/19 0037  NA 150* 151* 152*  K 3.2* 3.8 3.4*  CL 125* 128* 126*  CO2 16* 17* 19*  BUN 88* 82* 63*  CREATININE 3.30* 3.12* 2.89*  GLUCOSE 253* 151* 190*   Recent Labs  Lab 05/29/19 1344 05/30/19 0446 05/31/19 0554  HGB 11.4* 11.7* 10.0*  HCT 31.2* 32.5* 28.1*  WBC 8.1 10.0 11.5*  PLT 74* 71* 62*     IMAGING RESULTS: [place] Renal  Result Date: 05/30/2019 CLINICAL DATA:  Acute renal failure. EXAM: RENAL / URINARY TRACT ULTRASOUND COMPLETE COMPARISON:  None. FINDINGS: Right Kidney: Renal measurements: 12.5 x 4.8 x 5.9 cm = volume: 186 mL . Echogenicity within normal limits. No mass or hydronephrosis visualized. Left Kidney: Renal measurements: 11.7 x 6.2 x 5.6 cm = volume: 211 mL. Normal parenchymal echogenicity. Cyst arises from the lower pole measuring 1.6 cm. No other masses, no stones and no hydronephrosis. Bladder: Collapsed by a Foley catheter. IMPRESSION: 1. No acute findings. No hydronephrosis. Normal renal parenchymal echogenicity. 2. 16 mm left renal cyst.  No other abnormalities. Electronically Signed   By: Amie Portlandavid  Ormond M.D.   On: 05/30/2019 11:37      ASSESSMENT AND PLAN    -Multidisciplinary rounds held today  Diabetic ketoacidosis -Improved status post phase 1 and 2 -Currently sedated not on oral diet   Candida bacteremia -Continue with IV Diflucan -ID on case appreciate input ICU monitoring  Renal Failure-most likely due to ATN -follow chem 7 -follow UO -continue Foley Catheter-assess need daily   NEUROLOGY - mentation improved no longer in acute drug/alcohol  withdrawal   ID -continue IV abx as prescibed -follow up cultures  GI/Nutrition GI PROPHYLAXIS as indicated DIET-->TF's as tolerated Constipation protocol as indicated  ENDO - ICU hypoglycemic\Hyperglycemia protocol -check FSBS per protocol   ELECTROLYTES -follow labs as needed -replace as needed -pharmacy consultation   DVT/GI PRX ordered -SCDs  TRANSFUSIONS AS NEEDED  MONITOR FSBS ASSESS the need for LABS as needed   Critical care provider statement:    Critical care time (minutes):  32   Critical care time was exclusive of:  Separately billable procedures and treating other patients   Critical care was necessary to treat or prevent imminent or life-threatening deterioration of the following conditions:   Diabetic ketoacidosis, unresponsive state due to drug overdose, AKI stage II, Candida bacteremia, multiple comorbid conditions   Critical care was time spent personally by me on the following activities:  Development of treatment plan with patient or surrogate, discussions with consultants, evaluation of patient's response to treatment, examination of patient, obtaining history from patient or surrogate, ordering and performing treatments and interventions, ordering and review of laboratory studies and re-evaluation of patient's condition.  I assumed direction of critical care for this patient from another provider in my specialty: no    This document was prepared using Dragon voice recognition software and may include unintentional dictation errors.    Vida RiggerFuad Donnajean Chesnut, M.D.  Division of Pulmonary & Critical Care Medicine  [facility]

## 2019-05-31 NOTE — Progress Notes (Addendum)
Patient needs to drink 2L of water per day to help lower his sodium level (Dr. Holley Raring)  Patient likes unsweet tea w/splenda and ice water.  He was drinking apple juice but when his sugar went up RN switched him to water.  MD ordered to stop his D5 because his blood sugar went up.  Patient is still confused somewhat.  He was happy to be getting full liquids.  He likes cream of wheat with splenda and butter and grits with butter.  RN held the salt for evening meal because his sodium was elevated.  Phillis Knack, RN

## 2019-05-31 NOTE — Progress Notes (Signed)
[facility]  ROUNDING NOTE   Subjective:  Good urine output noted at 2.2 L. Serum bicarbonate up to 19. Creatinine currently 2.89.  Objective:  Vital signs in last 24 hours:  Temp:  [97.5 F (36.4 C)-98.6 F (37 C)] 98.5 F (36.9 C) (07/25 0800) Pulse Rate:  [70-119] 78 (07/25 0800) Resp:  [15-31] 17 (07/25 0800) BP: (94-147)/(63-90) 112/84 (07/25 0800) SpO2:  [96 %-100 %] 99 % (07/25 0800) Weight:  [67.2 kg] 67.2 kg (07/25 0400)  Weight change: 0.7 kg Filed Weights   05/28/19 2110 05/30/19 0500 05/31/19 0400  Weight: 64.9 kg 66.5 kg 67.2 kg    Intake/Output: I/O last 3 completed shifts: In: 4184.6 [I.V.:1681.8; Other:1050; IV Piggyback:1452.8] Out: 2200 [Urine:2200]   Intake/Output this shift:  No intake/output data recorded.  Physical Exam: General: No acute distress  Head: Normocephalic, atraumatic.  Dry oral mucosal membranes  Eyes: Anicteric  Neck: Supple, trachea midline  Lungs:  Clear to auscultation, normal effort  Heart: S1S2 no rubs  Abdomen:  Soft, nontender, bowel sounds present  Extremities: No peripheral edema.  Neurologic: Arousable, confused  Skin: No lesions       Basic Metabolic Panel: Recent Labs  Lab 05/29/19 0526 05/29/19 0926 05/29/19 1343 05/30/19 0446 05/31/19 0037  NA 149* 149* 150* 151* 152*  K 3.4* 3.3* 3.2* 3.8 3.4*  CL 121* 123* 125* 128* 126*  CO2 11* 14* 16* 17* 19*  GLUCOSE 472* 349* 253* 151* 190*  BUN 81* 88* 88* 82* 63*  CREATININE 3.68* 3.60* 3.30* 3.12* 2.89*  CALCIUM 7.5* 7.7* 7.8* 8.5* 8.9  MG 2.1  --   --  1.8 2.2  PHOS <1.0*  --   --  1.6* 1.8*    Liver Function Tests: Recent Labs  Lab 05/28/19 1638  AST 32  ALT 21  ALKPHOS 143*  BILITOT 1.9*  PROT 5.7*  ALBUMIN 3.6   No results for input(s): LIPASE, AMYLASE in the last 168 hours. No results for input(s): AMMONIA in the last 168 hours.  CBC: Recent Labs  Lab 05/28/19 1638 05/29/19 0526 05/29/19 1344 05/30/19 0446  05/31/19 0554  WBC 24.6* 7.5 8.1 10.0 11.5*  NEUTROABS 20.7*  --  7.3  --   --   HGB 13.5 10.6* 11.4* 11.7* 10.0*  HCT 45.1 29.9* 31.2* 32.5* 28.1*  MCV 108.4* 90.9 87.9 87.8 88.6  PLT 208 80* 74* 71* 62*    Cardiac Enzymes: No results for input(s): CKTOTAL, CKMB, CKMBINDEX, TROPONINI in the last 168 hours.  BNP: Invalid input(s): POCBNP  CBG: Recent Labs  Lab 05/30/19 2042 05/30/19 2344 05/31/19 0026 05/31/19 0328 05/31/19 0728  GLUCAP 228* 201* 167* 137* 130*    Microbiology: Results for orders placed or performed during the hospital encounter of 05/28/19  Blood Culture (routine x 2)     Status: None (Preliminary result)   Collection Time: 05/28/19  4:38 PM   Specimen: BLOOD  Result Value Ref Range Status   Specimen Description BLOOD BLOOD LEFT ARM  Final   Special Requests   Final    BOTTLES DRAWN AEROBIC AND ANAEROBIC Blood Culture adequate volume   Culture  Setup Time   Final    YEAST AEROBIC BOTTLE ONLY CRITICAL RESULT CALLED TO, READ BACK BY AND VERIFIED WITH: JASON ROBBINS ON 05/29/2019 AT 2057 QSD Performed at [facility], [address], [place], [place] [address]    Culture YEAST  Final   Report Status PENDING  Incomplete  Blood Culture (routine x  2)     Status: Abnormal (Preliminary result)   Collection Time: 05/28/19  4:38 PM   Specimen: BLOOD  Result Value Ref Range Status   Specimen Description   Final    BLOOD BLOOD RIGHT ARM Performed at [facility], [address], [place], [place] [address]    Special Requests   Final    BOTTLES DRAWN AEROBIC AND ANAEROBIC Blood Culture adequate volume Performed at [facility], [address] [place], [place] [address]    Culture  Setup Time   Final    YEAST IN BOTH AEROBIC AND ANAEROBIC BOTTLES CRITICAL RESULT CALLED TO, READ BACK BY AND VERIFIED WITH: JASON ROBBINS ON 05/29/2019 AT 2057 QSD Performed at [facility], [address], [place], [place] [address]     Culture CANDIDA ALBICANS (A)  Final   Report Status PENDING  Incomplete  Urine culture     Status: None   Collection Time: 05/28/19  4:38 PM   Specimen: In/Out Cath Urine  Result Value Ref Range Status   Specimen Description   Final    IN/OUT CATH URINE Performed at [facility], [address] [place], [place] [address]    Special Requests   Final    NONE Performed at [facility], [address], [place], [place] [address]    Culture   Final    NO GROWTH Performed at [facility], [address], [place], [place] [address]    Report Status 05/29/2019 FINAL  Final  SARS Coronavirus 2 (CEPHEID- Performed in [facility] hospital lab), Hosp Order     Status: None   Collection Time: 05/28/19  4:38 PM   Specimen: Nasopharyngeal Swab  Result Value Ref Range Status   SARS Coronavirus 2 NEGATIVE NEGATIVE Final    Comment: (NOTE) If result is NEGATIVE SARS-CoV-2 target nucleic acids are NOT DETECTED. The SARS-CoV-2 RNA is generally detectable in upper and lower  respiratory specimens during the acute phase of infection. The lowest  concentration of SARS-CoV-2 viral copies this assay can detect is 250  copies / mL. A negative result does not preclude SARS-CoV-2 infection  and should not be used as the sole basis for treatment or other  patient management decisions.  A negative result may occur with  improper specimen collection / handling, submission of specimen other  than nasopharyngeal swab, presence of viral mutation(s) within the  areas targeted by this assay, and inadequate number of viral copies  (<250 copies / mL). A negative result must be combined with clinical  observations, patient history, and epidemiological information. If result is POSITIVE SARS-CoV-2 target nucleic acids are DETECTED. The SARS-CoV-2 RNA is generally detectable in upper and lower  respiratory specimens dur ing the acute phase of infection.  Positive  results are indicative  of active infection with SARS-CoV-2.  Clinical  correlation with patient history and other diagnostic information is  necessary to determine patient infection status.  Positive results do  not rule out bacterial infection or co-infection with other viruses. If result is PRESUMPTIVE POSTIVE SARS-CoV-2 nucleic acids MAY BE PRESENT.   A presumptive positive result was obtained on the submitted specimen  and confirmed on repeat testing.  While 2019 novel coronavirus  (SARS-CoV-2) nucleic acids may be present in the submitted sample  additional confirmatory testing may be necessary for epidemiological  and / or clinical management purposes  to differentiate between  SARS-CoV-2 and other Sarbecovirus currently known to infect humans.  If clinically indicated additional testing with an alternate test  methodology [phone]([id number]) is advised. The SARS-CoV-2 RNA is generally  detectable in upper and lower respiratory sp ecimens during the acute  phase of infection. The expected result is Negative. Fact Sheet for Patients:  BoilerBrush.com.cyhttps://www.fda.gov/media/136312/download Fact Sheet for Healthcare Providers: https://pope.com/https://www.fda.gov/media/136313/download This test is not yet approved or cleared by the [place] FDA and has been authorized for detection and/or diagnosis of SARS-CoV-2 by FDA under an Emergency Use Authorization (EUA).  This EUA will remain in effect (meaning this test can be used) for the duration of the COVID-19 declaration under Section 564(b)(1) of the Act, 21 U.S.C. section 360bbb-3(b)(1), unless the authorization is terminated or revoked sooner. Performed at [facility], [address] [place], [place] [address]   Blood Culture ID Panel (Reflexed)     Status: Abnormal   Collection Time: 05/28/19  4:38 PM  Result Value Ref Range Status   Enterococcus species NOT DETECTED NOT DETECTED Final   Listeria monocytogenes NOT DETECTED NOT DETECTED Final   Staphylococcus species NOT  DETECTED NOT DETECTED Final   Staphylococcus aureus (BCID) NOT DETECTED NOT DETECTED Final   Streptococcus species NOT DETECTED NOT DETECTED Final   Streptococcus agalactiae NOT DETECTED NOT DETECTED Final   Streptococcus pneumoniae NOT DETECTED NOT DETECTED Final   Streptococcus pyogenes NOT DETECTED NOT DETECTED Final   Acinetobacter baumannii NOT DETECTED NOT DETECTED Final   Enterobacteriaceae species NOT DETECTED NOT DETECTED Final   Enterobacter cloacae complex NOT DETECTED NOT DETECTED Final   Escherichia coli NOT DETECTED NOT DETECTED Final   Klebsiella oxytoca NOT DETECTED NOT DETECTED Final   Klebsiella pneumoniae NOT DETECTED NOT DETECTED Final   Proteus species NOT DETECTED NOT DETECTED Final   Serratia marcescens NOT DETECTED NOT DETECTED Final   Haemophilus influenzae NOT DETECTED NOT DETECTED Final   Neisseria meningitidis NOT DETECTED NOT DETECTED Final   Pseudomonas aeruginosa NOT DETECTED NOT DETECTED Final   Candida albicans DETECTED (A) NOT DETECTED Final    Comment: CRITICAL RESULT CALLED TO, READ BACK BY AND VERIFIED WITH: JASON ROBBINS ON 05/29/2019 AT 2057 QSD    Candida glabrata NOT DETECTED NOT DETECTED Final   Candida krusei NOT DETECTED NOT DETECTED Final   Candida parapsilosis NOT DETECTED NOT DETECTED Final   Candida tropicalis NOT DETECTED NOT DETECTED Final    Comment: Performed at [facility], [address] [place], [place] [address]  MRSA PCR Screening     Status: None   Collection Time: 05/28/19  9:11 PM   Specimen: Nasopharyngeal  Result Value Ref Range Status   MRSA by PCR NEGATIVE NEGATIVE Final    Comment:        The GeneXpert MRSA Assay (FDA approved for NASAL specimens only), is one component of a comprehensive MRSA colonization surveillance program. It is not intended to diagnose MRSA infection nor to guide or monitor treatment for MRSA infections. Performed at [facility], [address] [place], [place]  [id number]   CULTURE, BLOOD (ROUTINE X 2) w Reflex to ID Panel     Status: None (Preliminary result)   Collection Time: 05/31/19 12:24 AM   Specimen: BLOOD  Result Value Ref Range Status   Specimen Description BLOOD BLOOD RIGHT FOREARM  Final   Special Requests   Final    BOTTLES DRAWN AEROBIC AND ANAEROBIC Blood Culture results may not be optimal due to an excessive volume of blood received in culture bottles   Culture   Final  NO GROWTH < 12 HOURS Performed at [facility], [address] [place], [place] [address]    Report Status PENDING  Incomplete  CULTURE, BLOOD (ROUTINE X 2) w Reflex to ID Panel     Status: None (Preliminary result)   Collection Time: 05/31/19 12:35 AM   Specimen: BLOOD  Result Value Ref Range Status   Specimen Description BLOOD BLOOD RIGHT HAND  Final   Special Requests   Final    BOTTLES DRAWN AEROBIC AND ANAEROBIC Blood Culture adequate volume   Culture   Final    NO GROWTH < 12 HOURS Performed at [facility], [address] [place], [place] [address]    Report Status PENDING  Incomplete    Coagulation Studies: Recent Labs    05/30/19 0446  LABPROT 13.3  INR 1.0    Urinalysis: Recent Labs    05/28/19 1638  COLORURINE YELLOW*  LABSPEC 1.018  PHURINE 5.0  GLUCOSEU >=500*  HGBUR MODERATE*  BILIRUBINUR NEGATIVE  KETONESUR 80*  PROTEINUR 30*  NITRITE NEGATIVE  LEUKOCYTESUR NEGATIVE      Imaging: [place] Renal  Result Date: 05/30/2019 CLINICAL DATA:  Acute renal failure. EXAM: RENAL / URINARY TRACT ULTRASOUND COMPLETE COMPARISON:  None. FINDINGS: Right Kidney: Renal measurements: 12.5 x 4.8 x 5.9 cm = volume: 186 mL . Echogenicity within normal limits. No mass or hydronephrosis visualized. Left Kidney: Renal measurements: 11.7 x 6.2 x 5.6 cm = volume: 211 mL. Normal parenchymal echogenicity. Cyst arises from the lower pole measuring 1.6 cm. No other masses, no stones and no hydronephrosis. Bladder: Collapsed by a Foley  catheter. IMPRESSION: 1. No acute findings. No hydronephrosis. Normal renal parenchymal echogenicity. 2. 16 mm left renal cyst.  No other abnormalities. Electronically Signed   By: Amie Portlandavid  Ormond M.D.   On: 05/30/2019 11:37     Medications:   . fluconazole (DIFLUCAN) IV 200 mg (05/30/19 2216)  .  sodium bicarbonate  infusion 1000 mL 75 mL/hr at 05/31/19 0200   . Chlorhexidine Gluconate Cloth  6 each Topical Daily  . dextrose  25 mL Intravenous Once  . folic acid  1 mg Oral Daily  . insulin aspart  0-9 Units Subcutaneous Q4H  . insulin glargine  24 Units Subcutaneous QHS  . multivitamin with minerals  1 tablet Oral Daily  . pantoprazole (PROTONIX) IV  40 mg Intravenous Q12H  . thiamine  100 mg Oral Daily   Or  . thiamine  100 mg Intravenous Daily   acetaminophen **OR** acetaminophen, dextrose, LORazepam **OR** LORazepam, ondansetron **OR** ondansetron (ZOFRAN) IV  Assessment/ Plan:  58 y.o. male with a PMHx of diabetes mellitus type 2, who was [facility] to [facility]28[facility]2019 for evaluation of altered mental status.  1.  Acute renal failure secondary to severe volume depletion from DKA.  The patient's presenting glucose was 1180.   -Bicarbonate drip being continued at the moment.  Renal parameters improved as creatinine down to 2.89.  Continue to monitor renal parameters daily.  2.  Hypokalemia.    Potassium slightly low at 3.4.  Continue to monitor closely.  3.  Hypophosphatemia.    Serum phosphorus 1.8.  Administered the patient potassium phosphate again today.  4.  Diabetic ketoacidosis.    Anion gap is closed.  Patient now off of insulin drip.  5.  Hypernatremia.  Despite hypotonic bicarbonate infusion his serum sodiums still went up slightly to 152.  Discontinue bicarbonate drip and transition the patient to D5W.   LOS: 3 Aamya Orellana  Aedon Deason 7/25/202011:44 AM

## 2019-05-31 NOTE — Consult Note (Signed)
[place] for Electrolyte Monitoring and Replacement   Recent Labs: Potassium (mmol/L)  Date Value  05/31/2019 3.4 (L)   Magnesium (mg/dL)  Date Value  05/31/2019 2.2   Calcium (mg/dL)  Date Value  05/31/2019 8.9   Albumin (g/dL)  Date Value  05/28/2019 3.6   Phosphorus (mg/dL)  Date Value  05/31/2019 1.8 (L)   Sodium (mmol/L)  Date Value  05/31/2019 152 (H)   Assessment: Pharmacy consulted for electrolyte monitoring and replacement in 58 yo male admitted with DKA.  Goal of Therapy:  Electrolytes WNL K: ~4 Mg: ~2 Phos: ~2.5  Plan:  KPhos 30 mmol IV x 1 already ordered by provider.  No additional replacement needed at this time.   Electrolytes ordered with morning labs.  Pharmacy will continue to follow and replace electrolytes as needed.   Pernell Dupre, PharmD, BCPS Clinical Pharmacist 05/31/2019 2:34 AM

## 2019-05-31 NOTE — Progress Notes (Signed)
Patient ID: Spencer [name], male   DOB: [birth year], 58 y.o.   MRN: [id number]  [employer] PROGRESS NOTE  Spencer [name] WUJ:[id number] DOB: [birth year] DOA: 05/28/2019 PCP: [name], MD  HPI/Subjective: Patient states that he is hungry but still confused Objective: Vitals:   05/31/19 0800 05/31/19 1400  BP: 112/84 112/78  Pulse: 78 88  Resp: 17 20  Temp: 98.5 F (36.9 C) 98.5 F (36.9 C)  SpO2: 99% 100%    Intake/Output Summary (Last 24 hours) at 05/31/2019 1541 Last data filed at 05/31/2019 1520 Gross per 24 hour  Intake 2826.05 ml  Output 3100 ml  Net -273.95 ml   Filed Weights   05/28/19 2110 05/30/19 0500 05/31/19 0400  Weight: 64.9 kg 66.5 kg 67.2 kg    ROS: Review of Systems  Unable to perform ROS: Critical illness   Exam: Physical Exam  HENT:  Nose: No mucosal edema.  Brownish material around mouth.  Unable to look into mouth.  Eyes: Pupils are equal, round, and reactive to light. Lids are normal.  Neck: Carotid bruit is not present. No thyromegaly present.  Cardiovascular: Regular rhythm, S1 normal, S2 normal and normal heart sounds.  Pulses:      Dorsalis pedis pulses are 1+ on the right side and 1+ on the left side.  Respiratory: He has no decreased breath sounds. He has no wheezes. He has no rhonchi. He has no rales.  GI: Soft. Bowel sounds are normal. There is no abdominal tenderness.  Musculoskeletal:     Right ankle: He exhibits no swelling.     Left ankle: He exhibits no swelling.  Skin: Skin is warm. No rash noted. Nails show no clubbing.      Data Reviewed: Basic Metabolic Panel: Recent Labs  Lab 05/29/19 0526 05/29/19 0926 05/29/19 1343 05/30/19 0446 05/31/19 0037  NA 149* 149* 150* 151* 152*  K 3.4* 3.3* 3.2* 3.8 3.4*  CL 121* 123* 125* 128* 126*  CO2 11* 14* 16* 17* 19*  GLUCOSE 472* 349* 253* 151* 190*  BUN 81* 88* 88* 82* 63*  CREATININE 3.68* 3.60* 3.30* 3.12* 2.89*  CALCIUM 7.5* 7.7* 7.8* 8.5* 8.9  MG 2.1  --   --  1.8  2.2  PHOS <1.0*  --   --  1.6* 1.8*   Liver Function Tests: Recent Labs  Lab 05/28/19 1638  AST 32  ALT 21  ALKPHOS 143*  BILITOT 1.9*  PROT 5.7*  ALBUMIN 3.6   CBC: Recent Labs  Lab 05/28/19 1638 05/29/19 0526 05/29/19 1344 05/30/19 0446 05/31/19 0554  WBC 24.6* 7.5 8.1 10.0 11.5*  NEUTROABS 20.7*  --  7.3  --   --   HGB 13.5 10.6* 11.4* 11.7* 10.0*  HCT 45.1 29.9* 31.2* 32.5* 28.1*  MCV 108.4* 90.9 87.9 87.8 88.6  PLT 208 80* 74* 71* 62*    CBG: Recent Labs  Lab 05/31/19 0026 05/31/19 0328 05/31/19 0728 05/31/19 1236 05/31/19 1532  GLUCAP 167* 137* 130* 202* 342*    Recent Results (from the past 240 hour(s))  Blood Culture (routine x 2)     Status: None (Preliminary result)   Collection Time: 05/28/19  4:38 PM   Specimen: BLOOD  Result Value Ref Range Status   Specimen Description BLOOD BLOOD LEFT ARM  Final   Special Requests   Final    BOTTLES DRAWN AEROBIC AND ANAEROBIC Blood Culture adequate volume   Culture  Setup Time   Final    YEAST AEROBIC BOTTLE ONLY  CRITICAL RESULT CALLED TO, READ BACK BY AND VERIFIED WITH: JASON ROBBINS ON 05/29/2019 AT 2057 QSD Performed at [facility], [address] [place], [place] [address]    Culture YEAST  Final   Report Status PENDING  Incomplete  Blood Culture (routine x 2)     Status: Abnormal (Preliminary result)   Collection Time: 05/28/19  4:38 PM   Specimen: BLOOD  Result Value Ref Range Status   Specimen Description   Final    BLOOD BLOOD RIGHT ARM Performed at [facility], [address] [place], [place] [address]    Special Requests   Final    BOTTLES DRAWN AEROBIC AND ANAEROBIC Blood Culture adequate volume Performed at [facility], [address] [place], [place] [address]    Culture  Setup Time   Final    YEAST IN BOTH AEROBIC AND ANAEROBIC BOTTLES CRITICAL RESULT CALLED TO, READ BACK BY AND VERIFIED WITH: JASON ROBBINS ON 05/29/2019 AT 2057 QSD Performed at  [facility], [place] [address] [place], [place] [address]    Culture CANDIDA ALBICANS (A)  Final   Report Status PENDING  Incomplete  Urine culture     Status: None   Collection Time: 05/28/19  4:38 PM   Specimen: In/Out Cath Urine  Result Value Ref Range Status   Specimen Description   Final    IN/OUT CATH URINE Performed at [facility], [address], [place], [place] [address]    Special Requests   Final    NONE Performed at [facility], [address], [place], [place] [address]    Culture   Final    NO GROWTH Performed at [facility], [place] [address], [place], [place] [address]    Report Status 05/29/2019 FINAL  Final  SARS Coronavirus 2 (CEPHEID- Performed in [place] hospital lab), Hosp Order     Status: None   Collection Time: 05/28/19  4:38 PM   Specimen: Nasopharyngeal Swab  Result Value Ref Range Status   SARS Coronavirus 2 NEGATIVE NEGATIVE Final    Comment: (NOTE) If result is NEGATIVE SARS-CoV-2 target nucleic acids are NOT DETECTED. The SARS-CoV-2 RNA is generally detectable in upper and lower  respiratory specimens during the acute phase of infection. The lowest  concentration of SARS-CoV-2 viral copies this assay can detect is 250  copies / mL. A negative result does not preclude SARS-CoV-2 infection  and should not be used as the sole basis for treatment or other  patient management decisions.  A negative result may occur with  improper specimen collection / handling, submission of specimen other  than nasopharyngeal swab, presence of viral mutation(s) within the  areas targeted by this assay, and inadequate number of viral copies  (<250 copies / mL). A negative result must be combined with clinical  observations, patient history, and epidemiological information. If result is POSITIVE SARS-CoV-2 target nucleic acids are DETECTED. The SARS-CoV-2 RNA is generally detectable in upper and lower  respiratory specimens  dur ing the acute phase of infection.  Positive  results are indicative of active infection with SARS-CoV-2.  Clinical  correlation with patient history and other diagnostic information is  necessary to determine patient infection status.  Positive results do  not rule out bacterial infection or co-infection with other viruses. If result is PRESUMPTIVE POSTIVE SARS-CoV-2 nucleic acids MAY BE PRESENT.   A presumptive positive result was obtained on the submitted specimen  and confirmed on repeat  testing.  While 2019 novel coronavirus  (SARS-CoV-2) nucleic acids may be present in the submitted sample  additional confirmatory testing may be necessary for epidemiological  and / or clinical management purposes  to differentiate between  SARS-CoV-2 and other Sarbecovirus currently known to infect humans.  If clinically indicated additional testing with an alternate test  methodology [phone] is advised. The SARS-CoV-2 RNA is generally  detectable in upper and lower respiratory sp ecimens during the acute  phase of infection. The expected result is Negative. Fact Sheet for Patients:  BoilerBrush.com.cyhttps://www.fda.gov/media/136312/download Fact Sheet for Healthcare Providers: https://pope.com/https://www.fda.gov/media/136313/download This test is not yet approved or cleared by the [place] FDA and has been authorized for detection and/or diagnosis of SARS-CoV-2 by FDA under an Emergency Use Authorization (EUA).  This EUA will remain in effect (meaning this test can be used) for the duration of the COVID-19 declaration under Section 564(b)(1) of the Act, 21 U.S.C. section 360bbb-3(b)(1), unless the authorization is terminated or revoked sooner. Performed at [facility], [address] [place], [place] [address]   Blood Culture ID Panel (Reflexed)     Status: Abnormal   Collection Time: 05/28/19  4:38 PM  Result Value Ref Range Status   Enterococcus species NOT DETECTED NOT DETECTED Final   Listeria  monocytogenes NOT DETECTED NOT DETECTED Final   Staphylococcus species NOT DETECTED NOT DETECTED Final   Staphylococcus aureus (BCID) NOT DETECTED NOT DETECTED Final   Streptococcus species NOT DETECTED NOT DETECTED Final   Streptococcus agalactiae NOT DETECTED NOT DETECTED Final   Streptococcus pneumoniae NOT DETECTED NOT DETECTED Final   Streptococcus pyogenes NOT DETECTED NOT DETECTED Final   Acinetobacter baumannii NOT DETECTED NOT DETECTED Final   Enterobacteriaceae species NOT DETECTED NOT DETECTED Final   Enterobacter cloacae complex NOT DETECTED NOT DETECTED Final   Escherichia coli NOT DETECTED NOT DETECTED Final   Klebsiella oxytoca NOT DETECTED NOT DETECTED Final   Klebsiella pneumoniae NOT DETECTED NOT DETECTED Final   Proteus species NOT DETECTED NOT DETECTED Final   Serratia marcescens NOT DETECTED NOT DETECTED Final   Haemophilus influenzae NOT DETECTED NOT DETECTED Final   Neisseria meningitidis NOT DETECTED NOT DETECTED Final   Pseudomonas aeruginosa NOT DETECTED NOT DETECTED Final   Candida albicans DETECTED (A) NOT DETECTED Final    Comment: CRITICAL RESULT CALLED TO, READ BACK BY AND VERIFIED WITH: JASON ROBBINS ON 05/29/2019 AT 2057 QSD    Candida glabrata NOT DETECTED NOT DETECTED Final   Candida krusei NOT DETECTED NOT DETECTED Final   Candida parapsilosis NOT DETECTED NOT DETECTED Final   Candida tropicalis NOT DETECTED NOT DETECTED Final    Comment: Performed at [facility], [address] [place], [place] [address]  MRSA PCR Screening     Status: None   Collection Time: 05/28/19  9:11 PM   Specimen: Nasopharyngeal  Result Value Ref Range Status   MRSA by PCR NEGATIVE NEGATIVE Final    Comment:        The GeneXpert MRSA Assay (FDA approved for NASAL specimens only), is one component of a comprehensive MRSA colonization surveillance program. It is not intended to diagnose MRSA infection nor to guide or monitor treatment for MRSA  infections. Performed at [facility], [address] [place], [place] [address]   CULTURE, BLOOD (ROUTINE X 2) w Reflex to ID Panel     Status: None (Preliminary result)   Collection Time: 05/31/19 12:24 AM   Specimen: BLOOD  Result Value Ref Range Status  Specimen Description BLOOD BLOOD RIGHT FOREARM  Final   Special Requests   Final    BOTTLES DRAWN AEROBIC AND ANAEROBIC Blood Culture results may not be optimal due to an excessive volume of blood received in culture bottles   Culture   Final    NO GROWTH < 12 HOURS Performed at [facility], [address] [place], [place] [address]    Report Status PENDING  Incomplete  CULTURE, BLOOD (ROUTINE X 2) w Reflex to ID Panel     Status: None (Preliminary result)   Collection Time: 05/31/19 12:35 AM   Specimen: BLOOD  Result Value Ref Range Status   Specimen Description BLOOD BLOOD RIGHT HAND  Final   Special Requests   Final    BOTTLES DRAWN AEROBIC AND ANAEROBIC Blood Culture adequate volume   Culture   Final    NO GROWTH < 12 HOURS Performed at [facility], [address] [place], [place] [address]    Report Status PENDING  Incomplete     Studies: [place] Renal  Result Date: 05/30/2019 CLINICAL DATA:  Acute renal failure. EXAM: RENAL / URINARY TRACT ULTRASOUND COMPLETE COMPARISON:  None. FINDINGS: Right Kidney: Renal measurements: 12.5 x 4.8 x 5.9 cm = volume: 186 mL . Echogenicity within normal limits. No mass or hydronephrosis visualized. Left Kidney: Renal measurements: 11.7 x 6.2 x 5.6 cm = volume: 211 mL. Normal parenchymal echogenicity. Cyst arises from the lower pole measuring 1.6 cm. No other masses, no stones and no hydronephrosis. Bladder: Collapsed by a Foley catheter. IMPRESSION: 1. No acute findings. No hydronephrosis. Normal renal parenchymal echogenicity. 2. 16 mm left renal cyst.  No other abnormalities. Electronically Signed   By: Amie Portlandavid  Ormond M.D.   On: 05/30/2019 11:37    Scheduled  Meds: . Chlorhexidine Gluconate Cloth  6 each Topical Daily  . dextrose  25 mL Intravenous Once  . folic acid  1 mg Oral Daily  . insulin aspart  0-9 Units Subcutaneous Q4H  . insulin glargine  24 Units Subcutaneous QHS  . multivitamin with minerals  1 tablet Oral Daily  . pantoprazole (PROTONIX) IV  40 mg Intravenous Q12H  . thiamine  100 mg Oral Daily   Or  . thiamine  100 mg Intravenous Daily   Continuous Infusions: . dextrose 75 mL/hr at 05/31/19 1158  . fluconazole (DIFLUCAN) IV 200 mg (05/30/19 2216)  . potassium PHOSPHATE IVPB (in mmol) 30 mmol (05/31/19 1315)    Assessment/Plan:  1. Sepsis with Candida.  Continue Diflucan.  Echocardiogram pending appreciate ID input 2. acute metabolic encephalopathy.  Likely secondary to sepsis with Candida and diabetic ketoacidosis.  Patient is also received medications for agitation.  Now improved 3. Diabetic ketoacidosis.  Patient switched off insulin drip onto Lantus insulin and sliding scale.  Hemoglobin A1c elevated at 13.8. 4. Vomited dark material.  Hemoglobin remained stable.  Resume diet clear liquids 5. Drop in platelets.  Likely secondary to sepsis.  Pathology reviewed the peripheral smear and morphology is normal.  Continue monitor 6. Acute kidney injury likely from volume depletion and diabetic ketoacidosis.  Patient on bicarb drip.  Renal function stable appreciate nephrology input 7. Hypokalemia and hypophosphatemia.  Pharmacist electrolyte protocol 8. Hypernatremia.  Continue IV fluid we will start patient on a full liquid diet that should help  Code Status:     Code Status Orders  (From admission, onward)         Start     Ordered   05/28/19  1838  Full code  Continuous     05/28/19 1845        Code Status History    This patient has a current code status but no historical code status.   Advance Care Planning Activity     Family Communication: As per critical care team Disposition Plan: To be  determined  Consultants:  Critical care specialist  Nephrology  Antibiotics:  Diflucan  Time spent: 27 minutes, case discussed with critical care team  [facility]ijon Cosens  [employer]

## 2019-06-01 LAB — BASIC METABOLIC PANEL
Anion gap: 7 (ref 5–15)
BUN: 39 mg/dL — ABNORMAL HIGH (ref 6–20)
CO2: 22 mmol/L (ref 22–32)
Calcium: 8.1 mg/dL — ABNORMAL LOW (ref 8.9–10.3)
Chloride: 116 mmol/L — ABNORMAL HIGH (ref 98–111)
Creatinine, Ser: 2.18 mg/dL — ABNORMAL HIGH (ref 0.61–1.24)
GFR calc Af Amer: 37 mL/min — ABNORMAL LOW (ref >60)
GFR calc non Af Amer: 32 mL/min — ABNORMAL LOW (ref >60)
Glucose, Bld: 231 mg/dL — ABNORMAL HIGH (ref 70–99)
Potassium: 3.4 mmol/L — ABNORMAL LOW (ref 3.5–5.1)
Sodium: 145 mmol/L (ref 135–145)

## 2019-06-01 LAB — HEMOGLOBIN AND HEMATOCRIT, BLOOD
HCT: 30.5 % — ABNORMAL LOW (ref 39.0–52.0)
Hemoglobin: 10.4 g/dL — ABNORMAL LOW (ref 13.0–17.0)

## 2019-06-01 LAB — CULTURE, BLOOD (ROUTINE X 2): Special Requests: ADEQUATE

## 2019-06-01 LAB — MAGNESIUM: Magnesium: 1.8 mg/dL (ref 1.7–2.4)

## 2019-06-01 LAB — ECHOCARDIOGRAM COMPLETE
Height: 75 in
Weight: 2370.39 oz

## 2019-06-01 LAB — GLUCOSE, CAPILLARY
Glucose-Capillary: 57 mg/dL — ABNORMAL LOW (ref 70–99)
Glucose-Capillary: 168 mg/dL — ABNORMAL HIGH (ref 70–99)
Glucose-Capillary: 188 mg/dL — ABNORMAL HIGH (ref 70–99)
Glucose-Capillary: 236 mg/dL — ABNORMAL HIGH (ref 70–99)
Glucose-Capillary: 474 mg/dL — ABNORMAL HIGH (ref 70–99)
Glucose-Capillary: 387 mg/dL — ABNORMAL HIGH (ref 70–99)
Glucose-Capillary: 290 mg/dL — ABNORMAL HIGH (ref 70–99)

## 2019-06-01 LAB — PHOSPHORUS: Phosphorus: 3.4 mg/dL (ref 2.5–4.6)

## 2019-06-01 LAB — OCCULT BLOOD X 1 CARD TO LAB, STOOL: Fecal Occult Bld: POSITIVE — AB

## 2019-06-01 MED ORDER — INSULIN ASPART 100 UNIT/ML ~~LOC~~ SOLN
10.0000 [IU] | Freq: Once | SUBCUTANEOUS | Status: AC
  Administered 2019-06-01: 10 [IU] via SUBCUTANEOUS

## 2019-06-01 MED ORDER — INSULIN ASPART 100 UNIT/ML ~~LOC~~ SOLN
0.0000 [IU] | Freq: Three times a day (TID) | SUBCUTANEOUS | Status: DC
  Administered 2019-06-02: 07:00:00 1 [IU] via SUBCUTANEOUS
  Administered 2019-06-02: 3 [IU] via SUBCUTANEOUS
  Administered 2019-06-02 – 2019-06-03 (×2): 2 [IU] via SUBCUTANEOUS
  Administered 2019-06-03 – 2019-06-04 (×2): 3 [IU] via SUBCUTANEOUS
  Filled 2019-06-01 (×6): qty 1

## 2019-06-01 MED ORDER — POTASSIUM CHLORIDE 20 MEQ PO PACK
60.0000 meq | PACK | Freq: Once | ORAL | Status: AC
  Administered 2019-06-01: 60 meq via ORAL
  Filled 2019-06-01: qty 3

## 2019-06-01 MED ORDER — INSULIN ASPART 100 UNIT/ML ~~LOC~~ SOLN
0.0000 [IU] | Freq: Every day | SUBCUTANEOUS | Status: DC
  Administered 2019-06-01 – 2019-06-02 (×2): 4 [IU] via SUBCUTANEOUS
  Filled 2019-06-01 (×2): qty 1

## 2019-06-01 MED ORDER — INSULIN ASPART 100 UNIT/ML ~~LOC~~ SOLN
3.0000 [IU] | Freq: Three times a day (TID) | SUBCUTANEOUS | Status: DC
  Administered 2019-06-01 – 2019-06-02 (×3): 3 [IU] via SUBCUTANEOUS
  Filled 2019-06-01: qty 1

## 2019-06-01 MED ORDER — INSULIN ASPART 100 UNIT/ML ~~LOC~~ SOLN
SUBCUTANEOUS | Status: AC
  Filled 2019-06-01: qty 1

## 2019-06-01 NOTE — Progress Notes (Signed)
CRITICAL CARE PROGRESS NOTE       SUBJECTIVE FINDINGS & SIGNIFICANT EVENTS   Patient clinically improved. DKA is improved Drug/alcohol withdrawal syndrome improved, sedation stopped  Cadndida bacteremia being treated with IV fluconazole , ID evaluation today.  Optimizing for downgrade to medical floor   PAST MEDICAL HISTORY   No past medical history on file.   SURGICAL HISTORY   Unable to obtain surgical history   FAMILY HISTORY   No family history on file.   SOCIAL HISTORY   Social History   Tobacco Use  . Smoking status: Not on file  Substance Use Topics  . Alcohol use: Not on file  . Drug use: Not on file     MEDICATIONS   Current Medication:  Current Facility-Administered Medications:  .  acetaminophen (TYLENOL) tablet 650 mg, 650 mg, Oral, Q6H PRN **OR** acetaminophen (TYLENOL) suppository 650 mg, 650 mg, Rectal, Q6H PRN, Konidena, Snehalatha, MD, 650 mg at 05/29/19 2014 .  Chlorhexidine Gluconate Cloth 2 % PADS 6 each, 6 Katha Hammingeach, Topical, Daily, Eugenie NorrieBlakeney, Dana G, NP, 6 each at 05/30/19 1611 .  dextrose 50 % solution 25 mL, 25 mL, Intravenous, PRN, Willy Eddyobinson, Patrick, MD, 25 mL at 05/29/19 2354 .  dextrose 50 % solution 25 mL, 25 mL, Intravenous, Once, Eugenie NorrieBlakeney, Dana G, NP .  fluconazole (DIFLUCAN) IVPB 200 mg, 200 mg, Intravenous, Q24H, Shavonte Zhao, MD, Last Rate: 100 mL/hr at 05/31/19 2219, 200 mg at 05/31/19 2219 .  folic acid (FOLVITE) tablet 1 mg, 1 mg, Oral, Daily, Blakeney, Dana G, NP .  insulin aspart (novoLOG) injection 0-9 Units, 0-9 Units, Subcutaneous, Q4H, Erin FullingKasa, Kurian, MD, 2 Units at 05/31/19 2022 .  insulin glargine (LANTUS) injection 24 Units, 24 Units, Subcutaneous, QHS, Erin FullingKasa, Kurian, MD, 24 Units at 05/31/19 2128 .  multivitamin with minerals tablet 1 tablet, 1  tablet, Oral, Daily, Blakeney, Dana G, NP .  ondansetron (ZOFRAN) tablet 4 mg, 4 mg, Oral, Q6H PRN **OR** ondansetron (ZOFRAN) injection 4 mg, 4 mg, Intravenous, Q6H PRN, Katha HammingKonidena, Snehalatha, MD, 4 mg at 05/30/19 1700 .  pantoprazole (PROTONIX) injection 40 mg, 40 mg, Intravenous, Q12H, Alford HighlandWieting, Richard, MD, 40 mg at 05/31/19 2128 .  potassium chloride (KLOR-CON) packet 60 mEq, 60 mEq, Oral, Once, Tukov-Yual, Magdalene S, NP .  thiamine (VITAMIN B-1) tablet 100 mg, 100 mg, Oral, Daily **OR** thiamine (B-1) injection 100 mg, 100 mg, Intravenous, Daily, Blakeney, Neldon Newportana G, NP, 100 mg at 05/31/19 1014    ALLERGIES   Patient has no known allergies.    REVIEW OF SYSTEMS     10 point ROS unable to obtain due to sedation  PHYSICAL EXAMINATION   Vitals:   06/01/19 0505 06/01/19 0600  BP: 91/60 104/70  Pulse: 81 78  Resp: 20 (!) 21  Temp:    SpO2: 97% 98%    GENERAL: Sedated HEAD: Normocephalic, atraumatic.  EYES: Pupils equal, round, reactive to light.  No scleral icterus.  MOUTH: Moist mucosal membrane. NECK: Supple. No thyromegaly. No nodules. No JVD.  PULMONARY: Clear to auscultation bilaterally CARDIOVASCULAR: S1 and S2. Regular rate and rhythm. No murmurs, rubs, or gallops.  GASTROINTESTINAL: Soft, nontender, non-distended. No masses. Positive bowel sounds. No hepatosplenomegaly.  MUSCULOSKELETAL: No swelling, clubbing, or edema.  NEUROLOGIC: Mild distress due to acute illness SKIN:intact,warm,dry   LABS AND IMAGING       LAB RESULTS: Recent Labs  Lab 05/30/19 0446 05/31/19 0037 06/01/19 0514  NA 151* 152* 145  K 3.8 3.4* 3.4*  CL 128*  126* 116*  CO2 17* 19* 22  BUN 82* 63* 39*  CREATININE 3.12* 2.89* 2.18*  GLUCOSE 151* 190* 231*   Recent Labs  Lab 05/29/19 1344 05/30/19 0446 05/31/19 0554  HGB 11.4* 11.7* 10.0*  HCT 31.2* 32.5* 28.1*  WBC 8.1 10.0 11.5*  PLT 74* 71* 62*     IMAGING RESULTS: No results found.    ASSESSMENT AND PLAN     -Multidisciplinary rounds held today  Diabetic ketoacidosis -Improved status post phase 1 and 2 -Currently sedated not on oral diet   Candida bacteremia -Continue with IV Diflucan -ID on case appreciate input ICU monitoring  Renal Failure-most likely due to ATN -follow chem 7 -follow UO -continue Foley Catheter-assess need daily   NEUROLOGY - mentation improved no longer in acute drug/alcohol  withdrawal   ID -continue IV abx as prescibed -follow up cultures  GI/Nutrition GI PROPHYLAXIS as indicated DIET-->TF's as tolerated Constipation protocol as indicated  ENDO - ICU hypoglycemic\Hyperglycemia protocol -check FSBS per protocol   ELECTROLYTES -follow labs as needed -replace as needed -pharmacy consultation   DVT/GI PRX ordered -SCDs  TRANSFUSIONS AS NEEDED MONITOR FSBS ASSESS the need for LABS as needed   Critical care provider statement:    Critical care time (minutes):  32   Critical care time was exclusive of:  Separately billable procedures and treating other patients   Critical care was necessary to treat or prevent imminent or life-threatening deterioration of the following conditions:   Diabetic ketoacidosis, unresponsive state due to drug overdose, AKI stage II, Candida bacteremia, multiple comorbid conditions   Critical care was time spent personally by me on the following activities:  Development of treatment plan with patient or surrogate, discussions with consultants, evaluation of patient's response to treatment, examination of patient, obtaining history from patient or surrogate, ordering and performing treatments and interventions, ordering and review of laboratory studies and re-evaluation of patient's condition.  I assumed direction of critical care for this patient from another provider in my specialty: no    This document was prepared using Dragon voice recognition software and may include unintentional dictation errors.    Ottie Glazier, M.D.  Division of [facility]

## 2019-06-01 NOTE — Consult Note (Addendum)
[facility] GI Inpatient Consult Note   Kathline Magic, M.D.  Reason for Consult: Melena  Attending Requesting Consult: Dustin Flock, M.D.    History of Present Illness: Spencer [name] is a 58 y.o. male admitted for DKA.  Patient also has a been noted to have Candida septicemia/sepsis.  Also with acute kidney injury secondary to dehydration which is improving.  Patient was also encephalopathic for the last few days until today when he became awake, alert and oriented.  GI service was called due to observation of approximately 3 melenic stools this afternoon.  Patient denied any abdominal pain, history of peptic ulcer disease, diarrhea, nausea or vomiting currently.  He denies any previous history of peptic ulcer disease or previous upper GI bleeds.  He underwent colonoscopy "a couple years back" at [facility] which he recalls as being "normal".  He may have also undergone an upper endoscopy but is unsure if EGD was actually performed.  Past Medical History:  History reviewed. No pertinent past medical history.  Problem List: Patient Active Problem List   Diagnosis Date Noted  . DKA (diabetic ketoacidoses) ([place]) 05/28/2019    Past Surgical History: Reviewed  Allergies: No Known Allergies  Home Medications: No medications prior to admission.   Home medication reconciliation was completed with the patient.   Scheduled Inpatient Medications:   . Chlorhexidine Gluconate Cloth  6 each Topical Daily  . dextrose  25 mL Intravenous Once  . folic acid  1 mg Oral Daily  . insulin aspart  0-9 Units Subcutaneous Q4H  . insulin glargine  24 Units Subcutaneous QHS  . multivitamin with minerals  1 tablet Oral Daily  . pantoprazole (PROTONIX) IV  40 mg Intravenous Q12H  . thiamine  100 mg Oral Daily   Or  . thiamine  100 mg Intravenous Daily    Continuous Inpatient Infusions:   . fluconazole (DIFLUCAN) IV 200 mg (05/31/19 2219)    PRN Inpatient Medications:   acetaminophen **OR** acetaminophen, dextrose, ondansetron **OR** ondansetron (ZOFRAN) IV  Family History: family history is not on file.   GI Family History: Negative  Social History:    The patient denies ETOH, tobacco, or drug use.    Review of Systems: Review of Systems - Negative except History of present illness  Physical Examination: BP (!) 80/45   Pulse 82   Temp 97.6 F (36.4 C) (Oral)   Resp (!) 23   Ht 6\' 3"  (1.905 m)   Wt 65.3 kg   SpO2 98%   BMI 17.99 kg/m  Physical Exam Constitutional:      Appearance: Normal appearance.  HENT:     Head: Normocephalic.     Mouth/Throat:     Mouth: Mucous membranes are moist.  Eyes:     Conjunctiva/sclera: Conjunctivae normal.     Pupils: Pupils are equal, round, and reactive to light.  Neck:     Musculoskeletal: Normal range of motion.  Cardiovascular:     Rate and Rhythm: Normal rate.     Pulses: Normal pulses.     Heart sounds: No murmur. No gallop.   Pulmonary:     Effort: Pulmonary effort is normal.  Abdominal:     General: Abdomen is flat. There is no distension.     Tenderness: There is no abdominal tenderness. There is no rebound.  Skin:    Capillary Refill: Capillary refill takes less than 2 seconds.  Neurological:     General: No focal deficit present.  Mental Status: He is alert.  Psychiatric:        Mood and Affect: Mood normal.     Data: Lab Results  Component Value Date   WBC 11.5 (H) 05/31/2019   HGB 10.4 (L) 06/01/2019   HCT 30.5 (L) 06/01/2019   MCV 88.6 05/31/2019   PLT 62 (L) 05/31/2019   Recent Labs  Lab 05/30/19 0446 05/31/19 0554 06/01/19 1027  HGB 11.7* 10.0* 10.4*   Lab Results  Component Value Date   NA 145 06/01/2019   K 3.4 (L) 06/01/2019   CL 116 (H) 06/01/2019   CO2 22 06/01/2019   BUN 39 (H) 06/01/2019   CREATININE 2.18 (H) 06/01/2019   Lab Results  Component Value Date   ALT 21 05/28/2019   AST 32 05/28/2019   ALKPHOS 143 (H) 05/28/2019   BILITOT 1.9  (H) 05/28/2019   Recent Labs  Lab 05/30/19 0446  INR 1.0   CBC Latest Ref Rng & Units 06/01/2019 05/31/2019 05/30/2019  WBC 4.0 - 10.5 K/uL - 11.5(H) 10.0  Hemoglobin 13.0 - 17.0 g/dL 10.4(L) 10.0(L) 11.7(L)  Hematocrit 39.0 - 52.0 % 30.5(L) 28.1(L) 32.5(L)  Platelets 150 - 400 K/uL - 62(L) 71(L)    STUDIES: No results found. @IMAGES @  Assessment: 1. DKA - resolving.  2. Metabolic derangement - secondary to #1 above.  Still being managed. Hypophosphatemia, Hypernatremia.  3. Candida sepsis - Still some hypotension.  4. Possible melena,  hemodynamically insignificant  - UGI source likely such as PUD, gastritis, etc. But has not recurred today. H/H stable though Hgb  down by a little over 1 gram.  COVID-19 status: Tested negative   Recommendations:  1. Continue management of DKA, sepsis. Continue PPI.   2. Will follow along. When feasible or necessary, would entertain EGD.The patient understands the nature of the planned procedure, indications, risks, alternatives and potential complications including but not limited to bleeding, infection, perforation, damage to internal organs and possible oversedation/side effects from anesthesia. The patient agrees and gives consent to proceed if necessary.    3. Will see daily and monitor clinical status.  Thank you for the consult. Please call with questions or concerns.  Rosina Lowensteinoledo, Marija Calamari, "Mellody DanceKeith" MD [facility] [address] [place], [place] [address] [phone]  06/01/2019 4:37 PM

## 2019-06-01 NOTE — Progress Notes (Signed)
[facility]  ROUNDING NOTE   Subjective:  Renal function continues to improve. Urine output was 3.8 L over the preceding 24 hours. Creatinine down to 2.2. In good spirits today.  Objective:  Vital signs in last 24 hours:  Temp:  [97.2 F (36.2 C)-98.1 F (36.7 C)] 97.2 F (36.2 C) (07/26 0800) Pulse Rate:  [70-88] 81 (07/26 1100) Resp:  [17-22] 21 (07/26 1100) BP: (90-108)/(57-74) 90/57 (07/26 1100) SpO2:  [97 %-100 %] 99 % (07/26 1100) Weight:  [65.3 kg] 65.3 kg (07/26 0500)  Weight change: -1.9 kg Filed Weights   05/30/19 0500 05/31/19 0400 06/01/19 0500  Weight: 66.5 kg 67.2 kg 65.3 kg    Intake/Output: I/O last 3 completed shifts: In: 3902.1 [P.O.:1680; I.V.:1789.7; IV Piggyback:432.3] Out: 4975 [Urine:4975]   Intake/Output this shift:  Total I/O In: 240 [P.O.:240] Out: 425 [Urine:425]  Physical Exam: General: No acute distress  Head: Normocephalic, atraumatic.  Moist oral mucosal membranes  Eyes: Anicteric  Neck: Supple, trachea midline  Lungs:  Clear to auscultation, normal effort  Heart: S1S2 no rubs  Abdomen:  Soft, nontender, bowel sounds present  Extremities: No peripheral edema.  Neurologic: Awake, alert, conversant  Skin: No lesions       Basic Metabolic Panel: Recent Labs  Lab 05/29/19 0526 05/29/19 0926 05/29/19 1343 05/30/19 0446 05/31/19 0037 06/01/19 0514  NA 149* 149* 150* 151* 152* 145  K 3.4* 3.3* 3.2* 3.8 3.4* 3.4*  CL 121* 123* 125* 128* 126* 116*  CO2 11* 14* 16* 17* 19* 22  GLUCOSE 472* 349* 253* 151* 190* 231*  BUN 81* 88* 88* 82* 63* 39*  CREATININE 3.68* 3.60* 3.30* 3.12* 2.89* 2.18*  CALCIUM 7.5* 7.7* 7.8* 8.5* 8.9 8.1*  MG 2.1  --   --  1.8 2.2 1.8  PHOS <1.0*  --   --  1.6* 1.8* 3.4    Liver Function Tests: Recent Labs  Lab 05/28/19 1638  AST 32  ALT 21  ALKPHOS 143*  BILITOT 1.9*  PROT 5.7*  ALBUMIN 3.6   No results for input(s): LIPASE, AMYLASE in the last 168 hours. No results for  input(s): AMMONIA in the last 168 hours.  CBC: Recent Labs  Lab 05/28/19 1638 05/29/19 0526 05/29/19 1344 05/30/19 0446 05/31/19 0554 06/01/19 1027  WBC 24.6* 7.5 8.1 10.0 11.5*  --   NEUTROABS 20.7*  --  7.3  --   --   --   HGB 13.5 10.6* 11.4* 11.7* 10.0* 10.4*  HCT 45.1 29.9* 31.2* 32.5* 28.1* 30.5*  MCV 108.4* 90.9 87.9 87.8 88.6  --   PLT 208 80* 74* 71* 62*  --     Cardiac Enzymes: No results for input(s): CKTOTAL, CKMB, CKMBINDEX, TROPONINI in the last 168 hours.  BNP: Invalid input(s): POCBNP  CBG: Recent Labs  Lab 05/31/19 2345 06/01/19 0340 06/01/19 0446 06/01/19 0739 06/01/19 1210  GLUCAP 81 57* 168* 188* 236*    Microbiology: Results for orders placed or performed during the hospital encounter of 05/28/19  Blood Culture (routine x 2)     Status: None (Preliminary result)   Collection Time: 05/28/19  4:38 PM   Specimen: BLOOD  Result Value Ref Range Status   Specimen Description BLOOD BLOOD LEFT ARM  Final   Special Requests   Final    BOTTLES DRAWN AEROBIC AND ANAEROBIC Blood Culture adequate volume   Culture  Setup Time   Final    YEAST AEROBIC BOTTLE ONLY CRITICAL RESULT CALLED TO, READ BACK BY  AND VERIFIED WITH: JASON ROBBINS ON 05/29/2019 AT 2057 QSD Performed at [facility], [address] [place], [place] [address]    Culture YEAST  Final   Report Status PENDING  Incomplete  Blood Culture (routine x 2)     Status: Abnormal (Preliminary result)   Collection Time: 05/28/19  4:38 PM   Specimen: BLOOD  Result Value Ref Range Status   Specimen Description   Final    BLOOD BLOOD RIGHT ARM Performed at [facility], [address] [place], [place] [address]    Special Requests   Final    BOTTLES DRAWN AEROBIC AND ANAEROBIC Blood Culture adequate volume Performed at [facility], [address] [place], [place] [address]    Culture  Setup Time   Final    YEAST IN BOTH AEROBIC AND ANAEROBIC BOTTLES CRITICAL  RESULT CALLED TO, READ BACK BY AND VERIFIED WITH: JASON ROBBINS ON 05/29/2019 AT 2057 QSD Performed at [facility], [address] [place], [place] [address]    Culture CANDIDA ALBICANS (A)  Final   Report Status PENDING  Incomplete  Urine culture     Status: None   Collection Time: 05/28/19  4:38 PM   Specimen: In/Out Cath Urine  Result Value Ref Range Status   Specimen Description   Final    IN/OUT CATH URINE Performed at [facility], [address] [place], [place] [address]    Special Requests   Final    NONE Performed at [facility], [address] [place], [place] [address]    Culture   Final    NO GROWTH Performed at [facility], [address] [place], [place] [address]    Report Status 05/29/2019 FINAL  Final  SARS Coronavirus 2 (CEPHEID- Performed in [facility] lab), Hosp Order     Status: None   Collection Time: 05/28/19  4:38 PM   Specimen: Nasopharyngeal Swab  Result Value Ref Range Status   SARS Coronavirus 2 NEGATIVE NEGATIVE Final    Comment: (NOTE) If result is NEGATIVE SARS-CoV-2 target nucleic acids are NOT DETECTED. The SARS-CoV-2 RNA is generally detectable in upper and lower  respiratory specimens during the acute phase of infection. The lowest  concentration of SARS-CoV-2 viral copies this assay can detect is 250  copies / mL. A negative result does not preclude SARS-CoV-2 infection  and should not be used as the sole basis for treatment or other  patient management decisions.  A negative result may occur with  improper specimen collection / handling, submission of specimen other  than nasopharyngeal swab, presence of viral mutation(s) within the  areas targeted by this assay, and inadequate number of viral copies  (<250 copies / mL). A negative result must be combined with clinical  observations, patient history, and epidemiological information. If result is POSITIVE SARS-CoV-2 target nucleic acids are  DETECTED. The SARS-CoV-2 RNA is generally detectable in upper and lower  respiratory specimens dur ing the acute phase of infection.  Positive  results are indicative of active infection with SARS-CoV-2.  Clinical  correlation with patient history and other diagnostic information is  necessary to determine patient infection status.  Positive results do  not rule out bacterial infection or co-infection with other viruses. If result is PRESUMPTIVE POSTIVE SARS-CoV-2 nucleic acids MAY BE PRESENT.   A presumptive positive result was obtained on the submitted specimen  and confirmed on repeat testing.  While 2019 novel coronavirus  (  SARS-CoV-2) nucleic acids may be present in the submitted sample  additional confirmatory testing may be necessary for epidemiological  and / or clinical management purposes  to differentiate between  SARS-CoV-2 and other Sarbecovirus currently known to infect humans.  If clinically indicated additional testing with an alternate test  methodology [phone]([id number]) is advised. The SARS-CoV-2 RNA is generally  detectable in upper and lower respiratory sp ecimens during the acute  phase of infection. The expected result is Negative. Fact Sheet for Patients:  BoilerBrush.com.cyhttps://www.fda.gov/media/136312/download Fact Sheet for Healthcare Providers: https://pope.com/https://www.fda.gov/media/136313/download This test is not yet approved or cleared by the [place] FDA and has been authorized for detection and/or diagnosis of SARS-CoV-2 by FDA under an Emergency Use Authorization (EUA).  This EUA will remain in effect (meaning this test can be used) for the duration of the COVID-19 declaration under Section 564(b)(1) of the Act, 21 U.S.C. section 360bbb-3(b)(1), unless the authorization is terminated or revoked sooner. Performed at [facility], [address] [place], [place] [address]   Blood Culture ID Panel (Reflexed)     Status: Abnormal   Collection Time: 05/28/19  4:38 PM   Result Value Ref Range Status   Enterococcus species NOT DETECTED NOT DETECTED Final   Listeria monocytogenes NOT DETECTED NOT DETECTED Final   Staphylococcus species NOT DETECTED NOT DETECTED Final   Staphylococcus aureus (BCID) NOT DETECTED NOT DETECTED Final   Streptococcus species NOT DETECTED NOT DETECTED Final   Streptococcus agalactiae NOT DETECTED NOT DETECTED Final   Streptococcus pneumoniae NOT DETECTED NOT DETECTED Final   Streptococcus pyogenes NOT DETECTED NOT DETECTED Final   Acinetobacter baumannii NOT DETECTED NOT DETECTED Final   Enterobacteriaceae species NOT DETECTED NOT DETECTED Final   Enterobacter cloacae complex NOT DETECTED NOT DETECTED Final   Escherichia coli NOT DETECTED NOT DETECTED Final   Klebsiella oxytoca NOT DETECTED NOT DETECTED Final   Klebsiella pneumoniae NOT DETECTED NOT DETECTED Final   Proteus species NOT DETECTED NOT DETECTED Final   Serratia marcescens NOT DETECTED NOT DETECTED Final   Haemophilus influenzae NOT DETECTED NOT DETECTED Final   Neisseria meningitidis NOT DETECTED NOT DETECTED Final   Pseudomonas aeruginosa NOT DETECTED NOT DETECTED Final   Candida albicans DETECTED (A) NOT DETECTED Final    Comment: CRITICAL RESULT CALLED TO, READ BACK BY AND VERIFIED WITH: JASON ROBBINS ON 05/29/2019 AT 2057 QSD    Candida glabrata NOT DETECTED NOT DETECTED Final   Candida krusei NOT DETECTED NOT DETECTED Final   Candida parapsilosis NOT DETECTED NOT DETECTED Final   Candida tropicalis NOT DETECTED NOT DETECTED Final    Comment: Performed at [facility], [address] [place], [place] [address]  MRSA PCR Screening     Status: None   Collection Time: 05/28/19  9:11 PM   Specimen: Nasopharyngeal  Result Value Ref Range Status   MRSA by PCR NEGATIVE NEGATIVE Final    Comment:        The GeneXpert MRSA Assay (FDA approved for NASAL specimens only), is one component of a comprehensive MRSA colonization surveillance program. It is  not intended to diagnose MRSA infection nor to guide or monitor treatment for MRSA infections. Performed at [facility], [address] [place], [place] [address]   CULTURE, BLOOD (ROUTINE X 2) w Reflex to ID Panel     Status: None (Preliminary result)   Collection Time: 05/31/19 12:24 AM   Specimen: BLOOD  Result Value Ref Range Status   Specimen Description BLOOD BLOOD RIGHT FOREARM  Final   Special Requests   Final    BOTTLES DRAWN AEROBIC AND ANAEROBIC Blood Culture results may not be optimal due to an excessive volume of blood received in culture bottles   Culture   Final    NO GROWTH 1 DAY Performed at [facility], [address] [place], [place] [address]    Report Status PENDING  Incomplete  CULTURE, BLOOD (ROUTINE X 2) w Reflex to ID Panel     Status: None (Preliminary result)   Collection Time: 05/31/19 12:35 AM   Specimen: BLOOD  Result Value Ref Range Status   Specimen Description BLOOD BLOOD RIGHT HAND  Final   Special Requests   Final    BOTTLES DRAWN AEROBIC AND ANAEROBIC Blood Culture adequate volume   Culture   Final    NO GROWTH 1 DAY Performed at [facility], [address] [place], [place] [address]    Report Status PENDING  Incomplete    Coagulation Studies: Recent Labs    05/30/19 0446  LABPROT 13.3  INR 1.0    Urinalysis: No results for input(s): COLORURINE, LABSPEC, PHURINE, GLUCOSEU, HGBUR, BILIRUBINUR, KETONESUR, PROTEINUR, UROBILINOGEN, NITRITE, LEUKOCYTESUR in the last 72 hours.  Invalid input(s): APPERANCEUR    Imaging: No results found.   Medications:   . fluconazole (DIFLUCAN) IV 200 mg (05/31/19 2219)   . Chlorhexidine Gluconate Cloth  6 each Topical Daily  . dextrose  25 mL Intravenous Once  . folic acid  1 mg Oral Daily  . insulin aspart  0-9 Units Subcutaneous Q4H  . insulin glargine  24 Units Subcutaneous QHS  . multivitamin with minerals  1 tablet Oral Daily  . pantoprazole (PROTONIX)  IV  40 mg Intravenous Q12H  . thiamine  100 mg Oral Daily   Or  . thiamine  100 mg Intravenous Daily   acetaminophen **OR** acetaminophen, dextrose, ondansetron **OR** ondansetron (ZOFRAN) IV  Assessment/ Plan:  58 y.o. male with a PMHx of diabetes mellitus type 2, who was admitted to [facility] on 05/28/2019 for evaluation of altered mental status.  1.  Acute renal failure secondary to severe volume depletion from DKA.  The patient's presenting glucose was 1180.   -Patient doing much better.  Creatinine down to 2.2.  Urine output 3.8 L over the preceding 24 hours.  No indication for dialysis.  Continue to monitor renal parameters while here.  2.  Hypokalemia.    Potassium close to target at 3.4.  Continue to periodically monitor.  3.  Hypophosphatemia.    Patient now eating and phosphorus has normalized to 3.4.  4.  Diabetic ketoacidosis.    Patient was initially on insulin drip.  Anion gap closed.  Continue insulin as prescribed.  5.  Hypernatremia.  Serum sodium now normalized at 145.   LOS: 4 Kian Ottaviano 7/26/20202:09 PM

## 2019-06-01 NOTE — Progress Notes (Addendum)
Inpatient Diabetes Program Recommendations  AACE/ADA: New Consensus Statement on Inpatient Glycemic Control (2015)  Target Ranges:  Prepandial:   less than 140 mg/dL      Peak postprandial:   less than 180 mg/dL (1-2 hours)      Critically ill patients:  140 - 180 mg/dL   Lab Results  Component Value Date   GLUCAP 188 (H) 06/01/2019   HGBA1C 13.8 (H) 05/30/2019    Review of Glycemic Control Results for Spencer [name], Spencer [name] (MRN [id number]) as of 06/01/2019 08:22  Ref. Range 05/31/2019 23:45 06/01/2019 03:40 06/01/2019 04:46 06/01/2019 07:39  Glucose-Capillary Latest Ref Range: 70 - 99 mg/dL 81 57 (L) 168 (H) 188 (H)   Diabetes history: DM2 Outpatient Diabetes medications: Per merge chart (MRN [id number]) last hospital discharge on 02/19/19 was prescribed Lantus 24 units daily, Novolog 6 units TID with meals Current orders for Inpatient glycemic control: Lantus 24 units QHS, Novolog 0-9 units Q4H  Inpatient Diabetes Program Recommendations:  Noted hypoglycemic event of 57 mg/dL. Diet order placed this AM. Consider switching correction to Novolog 0-9 units TID. Addendum: Received page regarding 474 mg/dL. Consider above and adding Novolog 3 units TID (assuming patient is consuming >50% of meal). Additionally, consider repeating BMET.  @1657 - Spoke with CCMD. Order changes made.  Thanks, Bronson Curb, MSN, RNC-OB Diabetes Coordinator [phone] (8a-5p)

## 2019-06-01 NOTE — Progress Notes (Signed)
Patient orientation noted with improvements. Patient knows who hi is and where he is but confused as to why he is here. Patient's sister called and they talked on the phone for several minutes. Patient is increasing water intake. Patient was bathe.

## 2019-06-01 NOTE — Progress Notes (Signed)
Patient ID: Spencer [name], male   DOB: 07/04/1961, 58 y.o.   MRN: [id number]  [facility] PROGRESS NOTE  Spencer [name] BJY:[id number] DOB: 10/25/1961 DOA: 05/28/2019 PCP: Sherron Mondayejan-Sie, S Ahmed, MD  HPI/Subjective: Patient doing better more awake and alert Noted to have dark tarry stools    Objective: Vitals:   06/01/19 1100 06/01/19 1400  BP: (!) 90/57 (!) 83/48  Pulse: 81   Resp: (!) 21   Temp:    SpO2: 99%     Intake/Output Summary (Last 24 hours) at 06/01/2019 1422 Last data filed at 06/01/2019 1259 Gross per 24 hour  Intake 2617.47 ml  Output 4250 ml  Net -1632.53 ml   Filed Weights   05/30/19 0500 05/31/19 0400 06/01/19 0500  Weight: 66.5 kg 67.2 kg 65.3 kg    ROS: Review of Systems  Unable to perform ROS: Critical illness   Exam: Physical Exam  HENT:  Nose: No mucosal edema.  Eyes: Pupils are equal, round, and reactive to light. Lids are normal.  Neck: Carotid bruit is not present. No thyromegaly present.  Cardiovascular: Regular rhythm, S1 normal, S2 normal and normal heart sounds.  Pulses:      Dorsalis pedis pulses are 1+ on the right side and 1+ on the left side.  Respiratory: He has no decreased breath sounds. He has no wheezes. He has no rhonchi. He has no rales.  GI: Soft. Bowel sounds are normal. There is no abdominal tenderness.  Musculoskeletal:     Right ankle: He exhibits no swelling.     Left ankle: He exhibits no swelling.  Skin: Skin is warm. No rash noted. Nails show no clubbing.      Data Reviewed: Basic Metabolic Panel: Recent Labs  Lab 05/29/19 0526 05/29/19 0926 05/29/19 1343 05/30/19 0446 05/31/19 0037 06/01/19 0514  NA 149* 149* 150* 151* 152* 145  K 3.4* 3.3* 3.2* 3.8 3.4* 3.4*  CL 121* 123* 125* 128* 126* 116*  CO2 11* 14* 16* 17* 19* 22  GLUCOSE 472* 349* 253* 151* 190* 231*  BUN 81* 88* 88* 82* 63* 39*  CREATININE 3.68* 3.60* 3.30* 3.12* 2.89* 2.18*  CALCIUM 7.5* 7.7* 7.8* 8.5* 8.9 8.1*  MG 2.1  --   --  1.8 2.2 1.8   PHOS <1.0*  --   --  1.6* 1.8* 3.4   Liver Function Tests: Recent Labs  Lab 05/28/19 1638  AST 32  ALT 21  ALKPHOS 143*  BILITOT 1.9*  PROT 5.7*  ALBUMIN 3.6   CBC: Recent Labs  Lab 05/28/19 1638 05/29/19 0526 05/29/19 1344 05/30/19 0446 05/31/19 0554 06/01/19 1027  WBC 24.6* 7.5 8.1 10.0 11.5*  --   NEUTROABS 20.7*  --  7.3  --   --   --   HGB 13.5 10.6* 11.4* 11.7* 10.0* 10.4*  HCT 45.1 29.9* 31.2* 32.5* 28.1* 30.5*  MCV 108.4* 90.9 87.9 87.8 88.6  --   PLT 208 80* 74* 71* 62*  --     CBG: Recent Labs  Lab 05/31/19 2345 06/01/19 0340 06/01/19 0446 06/01/19 0739 06/01/19 1210  GLUCAP 81 57* 168* 188* 236*    Recent Results (from the past 240 hour(s))  Blood Culture (routine x 2)     Status: Abnormal   Collection Time: 05/28/19  4:38 PM   Specimen: BLOOD  Result Value Ref Range Status   Specimen Description   Final    BLOOD BLOOD LEFT ARM Performed at [facility], [address] [place], [place] [address]  Special Requests   Final    BOTTLES DRAWN AEROBIC AND ANAEROBIC Blood Culture adequate volume Performed at [facility], [address] [place], [place] [address]    Culture  Setup Time   Final    YEAST AEROBIC BOTTLE ONLY CRITICAL RESULT CALLED TO, READ BACK BY AND VERIFIED WITH: JASON ROBBINS ON 05/29/2019 AT 2057 QSD Performed at [facility], [address] [place], [place] [address]    Culture CANDIDA ALBICANS (A)  Final   Report Status 06/01/2019 FINAL  Final  Blood Culture (routine x 2)     Status: Abnormal   Collection Time: 05/28/19  4:38 PM   Specimen: BLOOD  Result Value Ref Range Status   Specimen Description   Final    BLOOD BLOOD RIGHT ARM Performed at [facility], [address] [place], [place] [address]    Special Requests   Final    BOTTLES DRAWN AEROBIC AND ANAEROBIC Blood Culture adequate volume Performed at [facility], [address] [place], [place] [address]     Culture  Setup Time   Final    YEAST IN BOTH AEROBIC AND ANAEROBIC BOTTLES CRITICAL RESULT CALLED TO, READ BACK BY AND VERIFIED WITH: JASON ROBBINS ON 05/29/2019 AT 2057 QSD Performed at [facility], [address] [place], [place] [address]    Culture CANDIDA ALBICANS (A)  Final   Report Status 06/01/2019 FINAL  Final  Urine culture     Status: None   Collection Time: 05/28/19  4:38 PM   Specimen: In/Out Cath Urine  Result Value Ref Range Status   Specimen Description   Final    IN/OUT CATH URINE Performed at [facility], [address] [place], [place] [address]    Special Requests   Final    NONE Performed at [facility], [address] [place], [place] [address]    Culture   Final    NO GROWTH Performed at [facility], [address] [place], [place] [address]    Report Status 05/29/2019 FINAL  Final  SARS Coronavirus 2 (CEPHEID- Performed in [facility] hospital lab), Hosp Order     Status: None   Collection Time: 05/28/19  4:38 PM   Specimen: Nasopharyngeal Swab  Result Value Ref Range Status   SARS Coronavirus 2 NEGATIVE NEGATIVE Final    Comment: (NOTE) If result is NEGATIVE SARS-CoV-2 target nucleic acids are NOT DETECTED. The SARS-CoV-2 RNA is generally detectable in upper and lower  respiratory specimens during the acute phase of infection. The lowest  concentration of SARS-CoV-2 viral copies this assay can detect is 250  copies / mL. A negative result does not preclude SARS-CoV-2 infection  and should not be used as the sole basis for treatment or other  patient management decisions.  A negative result may occur with  improper specimen collection / handling, submission of specimen other  than nasopharyngeal swab, presence of viral mutation(s) within the  areas targeted by this assay, and inadequate number of viral copies  (<250 copies / mL). A negative result must be combined with clinical  observations, patient history,  and epidemiological information. If result is POSITIVE SARS-CoV-2 target nucleic acids are DETECTED. The SARS-CoV-2 RNA is generally detectable in upper and lower  respiratory specimens dur ing the acute phase of infection.  Positive  results are indicative of active infection with SARS-CoV-2.  Clinical  correlation with patient history and other diagnostic information is  necessary  to determine patient infection status.  Positive results do  not rule out bacterial infection or co-infection with other viruses. If result is PRESUMPTIVE POSTIVE SARS-CoV-2 nucleic acids MAY BE PRESENT.   A presumptive positive result was obtained on the submitted specimen  and confirmed on repeat testing.  While 2019 novel coronavirus  (SARS-CoV-2) nucleic acids may be present in the submitted sample  additional confirmatory testing may be necessary for epidemiological  and / or clinical management purposes  to differentiate between  SARS-CoV-2 and other Sarbecovirus currently known to infect humans.  If clinically indicated additional testing with an alternate test  methodology [phone] is advised. The SARS-CoV-2 RNA is generally  detectable in upper and lower respiratory sp ecimens during the acute  phase of infection. The expected result is Negative. Fact Sheet for Patients:  BoilerBrush.com.cyhttps://www.fda.gov/media/136312/download Fact Sheet for Healthcare Providers: https://pope.com/https://www.fda.gov/media/136313/download This test is not yet approved or cleared by the [place] FDA and has been authorized for detection and/or diagnosis of SARS-CoV-2 by FDA under an Emergency Use Authorization (EUA).  This EUA will remain in effect (meaning this test can be used) for the duration of the COVID-19 declaration under Section 564(b)(1) of the Act, 21 U.S.C. section 360bbb-3(b)(1), unless the authorization is terminated or revoked sooner. Performed at [facility], [address] [place], [place] [address]   Blood  Culture ID Panel (Reflexed)     Status: Abnormal   Collection Time: 05/28/19  4:38 PM  Result Value Ref Range Status   Enterococcus species NOT DETECTED NOT DETECTED Final   Listeria monocytogenes NOT DETECTED NOT DETECTED Final   Staphylococcus species NOT DETECTED NOT DETECTED Final   Staphylococcus aureus (BCID) NOT DETECTED NOT DETECTED Final   Streptococcus species NOT DETECTED NOT DETECTED Final   Streptococcus agalactiae NOT DETECTED NOT DETECTED Final   Streptococcus pneumoniae NOT DETECTED NOT DETECTED Final   Streptococcus pyogenes NOT DETECTED NOT DETECTED Final   Acinetobacter baumannii NOT DETECTED NOT DETECTED Final   Enterobacteriaceae species NOT DETECTED NOT DETECTED Final   Enterobacter cloacae complex NOT DETECTED NOT DETECTED Final   Escherichia coli NOT DETECTED NOT DETECTED Final   Klebsiella oxytoca NOT DETECTED NOT DETECTED Final   Klebsiella pneumoniae NOT DETECTED NOT DETECTED Final   Proteus species NOT DETECTED NOT DETECTED Final   Serratia marcescens NOT DETECTED NOT DETECTED Final   Haemophilus influenzae NOT DETECTED NOT DETECTED Final   Neisseria meningitidis NOT DETECTED NOT DETECTED Final   Pseudomonas aeruginosa NOT DETECTED NOT DETECTED Final   Candida albicans DETECTED (A) NOT DETECTED Final    Comment: CRITICAL RESULT CALLED TO, READ BACK BY AND VERIFIED WITH: JASON ROBBINS ON 05/29/2019 AT 2057 QSD    Candida glabrata NOT DETECTED NOT DETECTED Final   Candida krusei NOT DETECTED NOT DETECTED Final   Candida parapsilosis NOT DETECTED NOT DETECTED Final   Candida tropicalis NOT DETECTED NOT DETECTED Final    Comment: Performed at [facility], [address] [place], [place] [address]  MRSA PCR Screening     Status: None   Collection Time: 05/28/19  9:11 PM   Specimen: Nasopharyngeal  Result Value Ref Range Status   MRSA by PCR NEGATIVE NEGATIVE Final    Comment:        The GeneXpert MRSA Assay (FDA approved for NASAL  specimens only), is one component of a comprehensive MRSA colonization surveillance program. It is not intended to diagnose MRSA infection nor to guide or monitor treatment for MRSA infections. Performed at  [facility], [address] [place], [place] [address]   CULTURE, BLOOD (ROUTINE X 2) w Reflex to ID Panel     Status: None (Preliminary result)   Collection Time: 05/31/19 12:24 AM   Specimen: BLOOD  Result Value Ref Range Status   Specimen Description BLOOD BLOOD RIGHT FOREARM  Final   Special Requests   Final    BOTTLES DRAWN AEROBIC AND ANAEROBIC Blood Culture results may not be optimal due to an excessive volume of blood received in culture bottles   Culture   Final    NO GROWTH 1 DAY Performed at [facility], [address], [place], [place] [address]    Report Status PENDING  Incomplete  CULTURE, BLOOD (ROUTINE X 2) w Reflex to ID Panel     Status: None (Preliminary result)   Collection Time: 05/31/19 12:35 AM   Specimen: BLOOD  Result Value Ref Range Status   Specimen Description BLOOD BLOOD RIGHT HAND  Final   Special Requests   Final    BOTTLES DRAWN AEROBIC AND ANAEROBIC Blood Culture adequate volume   Culture   Final    NO GROWTH 1 DAY Performed at [facility], [address], [place], [place] [address]    Report Status PENDING  Incomplete     Studies: No results found.  Scheduled Meds: . Chlorhexidine Gluconate Cloth  6 each Topical Daily  . dextrose  25 mL Intravenous Once  . folic acid  1 mg Oral Daily  . insulin aspart  0-9 Units Subcutaneous Q4H  . insulin glargine  24 Units Subcutaneous QHS  . multivitamin with minerals  1 tablet Oral Daily  . pantoprazole (PROTONIX) IV  40 mg Intravenous Q12H  . thiamine  100 mg Oral Daily   Or  . thiamine  100 mg Intravenous Daily   Continuous Infusions: . fluconazole (DIFLUCAN) IV 200 mg (05/31/19 2219)    Assessment/Plan:  1. Sepsis with Candida.  Continue Diflucan.   Echocardiogram negative appreciate ID input 2. acute metabolic encephalopathy.  Resolved  3. diabetic ketoacidosis.  Patient switched off insulin drip onto Lantus insulin and sliding scale.  Hemoglobin A1c elevated at 13.8. 4. Dark tarry stools asked GI to see Protonix 5. Drop in platelets.  Likely secondary to sepsis.  Pathology reviewed the peripheral smear and morphology is normal.  Continue monitor 6. Acute kidney injury likely from volume depletion and diabetic ketoacidosis.  Patient on bicarb drip.  Renal function stable appreciate nephrology input 7. Hypokalemia and hypophosphatemia.  Pharmacist electrolyte protocol 8. Hypernatremia.  Continue IV fluid we will start patient on a full liquid diet that should help  Code Status:     Code Status Orders  (From admission, onward)         Start     Ordered   05/28/19 1838  Full code  Continuous     05/28/19 1845        Code Status History    This patient has a current code status but no historical code status.   Advance Care Planning Activity     Family Communication: As per critical care team Disposition Plan: To be determined  Consultants:  Critical care specialist  Nephrology  Antibiotics:  Diflucan  Time spent: 27 minutes, case discussed with critical care team  [place]

## 2019-06-01 NOTE — Progress Notes (Signed)
Pt has remained alert and oriented with no c/o pain. Pt has remained on RA, SpO2 > 95%, lung sounds clear to auscultation. Pt has remained in NSR on cardiac monitor. BP has remained soft, HR WNL.  Foley removed today- pt has voided to urinal.  Pt experienced 4 loose, dark, medium sized BM this am->       H & H has been ordered q 8. GI consult. Pt cbg have slowly trended up. Diabetic coordinator was contacted for recommended changes to insulin regimen.

## 2019-06-01 NOTE — Progress Notes (Signed)
Pharmacy Antibiotic Note  Spencer [name] is a 58 y.o. male admitted on 05/28/2019 with candidemia.  Pharmacy has been consulted for fluconazole dosing.  Patient received Fluconazole 800mg  IV x 1 dose on 7/23   Plan: Continue  fluconazole 200 mg IV Q24H   Height: 6\' 3"  (190.5 cm) Weight: 143 lb 15.4 oz (65.3 kg) IBW/kg (Calculated) : 84.5  Temp (24hrs), Avg:98.3 F (36.8 C), Min:98.1 F (36.7 C), Max:98.5 F (36.9 C)  Recent Labs  Lab 05/28/19 1638 05/28/19 2124 05/29/19 0140 05/29/19 0526 05/29/19 0926 05/29/19 1343 05/29/19 1344 05/30/19 0446 05/31/19 0037 05/31/19 0554 06/01/19 0514  WBC 24.6*  --   --  7.5  --   --  8.1 10.0  --  11.5*  --   CREATININE 4.05* 3.99* 3.79* 3.68* 3.60* 3.30*  --  3.12* 2.89*  --  2.18*  LATICACIDVEN 3.7* 2.1* 2.1*  --   --   --   --   --   --   --   --     Estimated Creatinine Clearance: 33.3 mL/min (A) (by C-G formula based on SCr of 2.18 mg/dL (H)).    No Known Allergies  Antimicrobials this admission: 7/22 cefepime  >> 7/24 7/22 Vancomycin x 1 7/23 Fluconazole>>   Microbiology results:   BCx:  Candida albicans in 2 of 4 bottles (7/23)  UCx:  NG Final   MRSA PCR: Negative   Thank you for allowing pharmacy to be a part of this patient's care.  Pernell Dupre, PharmD, BCPS Clinical Pharmacist 06/01/2019 6:28 AM

## 2019-06-01 NOTE — Consult Note (Signed)
[place] for Electrolyte Monitoring and Replacement   Recent Labs: Potassium (mmol/L)  Date Value  06/01/2019 3.4 (L)   Magnesium (mg/dL)  Date Value  06/01/2019 1.8   Calcium (mg/dL)  Date Value  06/01/2019 8.1 (L)   Albumin (g/dL)  Date Value  05/28/2019 3.6   Phosphorus (mg/dL)  Date Value  06/01/2019 3.4   Sodium (mmol/L)  Date Value  06/01/2019 145   Assessment: Pharmacy consulted for electrolyte monitoring and replacement in 58 yo male admitted with DKA.  Goal of Therapy:  Electrolytes WNL K: ~4 Mg: ~2 Phos: ~2.5  Plan:  KCL 90mEq x 1 dose already ordered by provider.  No additional replacement needed at this time.   Electrolytes ordered with morning labs.  Pharmacy will continue to follow and replace electrolytes as needed.   Pernell Dupre, PharmD, BCPS Clinical Pharmacist 06/01/2019 6:23 AM

## 2019-06-01 NOTE — Progress Notes (Signed)
Hypoglycemic Event  CBG: 57  Treatment: 4 oz juice/soda  Symptoms: None  Follow-up CBG: Time: 0405 CBG  Result:167  Possible Reasons for Event: Inadequate meal intake  Comments/MD notified: Maggie NP updated    Gerhard Perches

## 2019-06-02 DIAGNOSIS — K921 Melena: Secondary | ICD-10-CM

## 2019-06-02 LAB — CBC
HCT: 24.6 % — ABNORMAL LOW (ref 39.0–52.0)
Hemoglobin: 8.3 g/dL — ABNORMAL LOW (ref 13.0–17.0)
MCH: 31.6 pg (ref 26.0–34.0)
MCHC: 33.7 g/dL (ref 30.0–36.0)
MCV: 93.5 fL (ref 80.0–100.0)
Platelets: 77 10*3/uL — ABNORMAL LOW (ref 150–400)
RBC: 2.63 MIL/uL — ABNORMAL LOW (ref 4.22–5.81)
RDW: 14.1 % (ref 11.5–15.5)
WBC: 7.8 10*3/uL (ref 4.0–10.5)
nRBC: 0 % (ref 0.0–0.2)

## 2019-06-02 LAB — GLOMERULAR BASEMENT MEMBRANE ANTIBODIES: GBM Ab: 2 units (ref 0–20)

## 2019-06-02 LAB — GLUCOSE, CAPILLARY
Glucose-Capillary: 140 mg/dL — ABNORMAL HIGH (ref 70–99)
Glucose-Capillary: 200 mg/dL — ABNORMAL HIGH (ref 70–99)
Glucose-Capillary: 220 mg/dL — ABNORMAL HIGH (ref 70–99)
Glucose-Capillary: 243 mg/dL — ABNORMAL HIGH (ref 70–99)
Glucose-Capillary: 272 mg/dL — ABNORMAL HIGH (ref 70–99)
Glucose-Capillary: 346 mg/dL — ABNORMAL HIGH (ref 70–99)

## 2019-06-02 LAB — PROTEIN ELECTROPHORESIS, SERUM
A/G Ratio: 1.9 — ABNORMAL HIGH (ref 0.7–1.7)
Albumin ELP: 3.1 g/dL (ref 2.9–4.4)
Alpha-1-Globulin: 0.3 g/dL (ref 0.0–0.4)
Alpha-2-Globulin: 0.6 g/dL (ref 0.4–1.0)
Beta Globulin: 0.5 g/dL — ABNORMAL LOW (ref 0.7–1.3)
Gamma Globulin: 0.3 g/dL — ABNORMAL LOW (ref 0.4–1.8)
Globulin, Total: 1.6 g/dL — ABNORMAL LOW (ref 2.2–3.9)
Total Protein ELP: 4.7 g/dL — ABNORMAL LOW (ref 6.0–8.5)

## 2019-06-02 LAB — MAGNESIUM: Magnesium: 1.8 mg/dL (ref 1.7–2.4)

## 2019-06-02 LAB — HEMOGLOBIN AND HEMATOCRIT, BLOOD
HCT: 25.7 % — ABNORMAL LOW (ref 39.0–52.0)
HCT: 24.9 % — ABNORMAL LOW (ref 39.0–52.0)
Hemoglobin: 8.7 g/dL — ABNORMAL LOW (ref 13.0–17.0)
Hemoglobin: 8.6 g/dL — ABNORMAL LOW (ref 13.0–17.0)

## 2019-06-02 LAB — BASIC METABOLIC PANEL
Anion gap: 4 — ABNORMAL LOW (ref 5–15)
BUN: 35 mg/dL — ABNORMAL HIGH (ref 6–20)
CO2: 22 mmol/L (ref 22–32)
Calcium: 8.1 mg/dL — ABNORMAL LOW (ref 8.9–10.3)
Chloride: 119 mmol/L — ABNORMAL HIGH (ref 98–111)
Creatinine, Ser: 2.14 mg/dL — ABNORMAL HIGH (ref 0.61–1.24)
GFR calc Af Amer: 38 mL/min — ABNORMAL LOW (ref >60)
GFR calc non Af Amer: 32 mL/min — ABNORMAL LOW (ref >60)
Glucose, Bld: 201 mg/dL — ABNORMAL HIGH (ref 70–99)
Potassium: 3.6 mmol/L (ref 3.5–5.1)
Sodium: 145 mmol/L (ref 135–145)

## 2019-06-02 LAB — PHOSPHORUS: Phosphorus: 3.3 mg/dL (ref 2.5–4.6)

## 2019-06-02 MED ORDER — MAGNESIUM SULFATE 2 GM/50ML IV SOLN
2.0000 g | Freq: Once | INTRAVENOUS | Status: AC
  Administered 2019-06-02: 2 g via INTRAVENOUS
  Filled 2019-06-02: qty 50

## 2019-06-02 MED ORDER — POTASSIUM CHLORIDE CRYS ER 20 MEQ PO TBCR
40.0000 meq | EXTENDED_RELEASE_TABLET | Freq: Once | ORAL | Status: AC
  Administered 2019-06-02: 40 meq via ORAL
  Filled 2019-06-02: qty 2

## 2019-06-02 MED ORDER — SODIUM CHLORIDE 0.9 % IV SOLN
INTRAVENOUS | Status: DC
  Administered 2019-06-02: 20 mL/h via INTRAVENOUS
  Administered 2019-06-03: 13:00:00 via INTRAVENOUS

## 2019-06-02 MED ORDER — LACTATED RINGERS IV BOLUS
1000.0000 mL | Freq: Once | INTRAVENOUS | Status: AC
  Administered 2019-06-02: 1000 mL via INTRAVENOUS

## 2019-06-02 MED ORDER — SODIUM CHLORIDE 0.9 % IV SOLN: INTRAVENOUS | Status: DC

## 2019-06-02 MED ORDER — PANTOPRAZOLE SODIUM 40 MG PO TBEC
40.0000 mg | DELAYED_RELEASE_TABLET | Freq: Every day | ORAL | Status: DC
  Administered 2019-06-02 – 2019-06-04 (×3): 40 mg via ORAL
  Filled 2019-06-02 (×2): qty 1

## 2019-06-02 MED ORDER — INSULIN ASPART 100 UNIT/ML ~~LOC~~ SOLN
6.0000 [IU] | Freq: Three times a day (TID) | SUBCUTANEOUS | Status: DC
  Administered 2019-06-02 – 2019-06-03 (×2): 6 [IU] via SUBCUTANEOUS
  Filled 2019-06-02 (×2): qty 1

## 2019-06-02 NOTE — Progress Notes (Signed)
More alert and comfortable with unit. Denied using alcohol and cocaine and smoking. States he takes care of himself and takes his insulin regularly. Signed permits for TEE and EGD.

## 2019-06-02 NOTE — Progress Notes (Signed)
ID Awake and alert , talkative Melena stools   Patient Vitals for the past 24 hrs:  BP Temp Temp src Pulse Resp SpO2 Weight  06/02/19 0800 (!) 122/57 98.2 F (36.8 C) - 71 (!) 22 99 % -  06/02/19 0400 - - - - - - 68.3 kg  06/02/19 0100 - 97.7 F (36.5 C) - - - - -  06/01/19 2000 - 97.7 F (36.5 C) Oral - - - -  06/01/19 1700 104/68 - - 85 (!) 21 96 % -  06/01/19 1600 - - - 85 19 98 % -  06/01/19 1500 (!) 80/45 - - 82 (!) 23 98 % -   O/E No distress Awake, alert, oriented  Chest b/l air entry    CBC Latest Ref Rng & Units 06/02/2019 06/02/2019 06/01/2019  WBC 4.0 - 10.5 K/uL - 7.8 -  Hemoglobin 13.0 - 17.0 g/dL 8.7(L) 8.3(L) 10.4(L)  Hematocrit 39.0 - 52.0 % 25.7(L) 24.6(L) 30.5(L)  Platelets 150 - 400 K/uL - 77(L) -    CMP Latest Ref Rng & Units 06/02/2019 06/01/2019 05/31/2019  Glucose 70 - 99 mg/dL 201(H) 231(H) 190(H)  BUN 6 - 20 mg/dL 35(H) 39(H) 63(H)  Creatinine 0.61 - 1.24 mg/dL 2.14(H) 2.18(H) 2.89(H)  Sodium 135 - 145 mmol/L 145 145 152(H)  Potassium 3.5 - 5.1 mmol/L 3.6 3.4(L) 3.4(L)  Chloride 98 - 111 mmol/L 119(H) 116(H) 126(H)  CO2 22 - 32 mmol/L 22 22 19(L)  Calcium 8.9 - 10.3 mg/dL 8.1(L) 8.1(L) 8.9  Total Protein 6.5 - 8.1 g/dL - - -  Total Bilirubin 0.3 - 1.2 mg/dL - - -  Alkaline Phos 38 - 126 U/L - - -  AST 15 - 41 U/L - - -  ALT 0 - 44 U/L - - -     58 year old male presenting in DKA.  Diabetic ketoacidosis.  Metabolicacidosis is significantly improved.  He is on insulin and getting IV fluids as well.  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  Not sure of the etiology.  Could be from the gastrointestinal tract or wonder whether he main lines.  on Fluconazole    2D echo no vegetation Will need TEE  He will need ophthalmology examination repeat blood cultures has been sent  AKI:  This likely is a combination of prerenal and ATN.improving  Leukocytosis has resolved.  Thrombocytopenia: Heparin has been stopped.   Check for HIT  Discussed with care team

## 2019-06-02 NOTE — Progress Notes (Signed)
Pharmacy Electrolyte Monitoring Consult:  Pharmacy consulted to assist in monitoring and replacing electrolytes in this 59 y.o. male admitted on 05/28/2019. Patient with candida albicans fungemia and is to undergo TEE on 7/28. Patient with history of crack cocaine abuse.   Labs:  Sodium (mmol/[name])  Date Value  06/02/2019 145   Potassium (mmol/[name])  Date Value  06/02/2019 3.6   Magnesium (mg/dL)  Date Value  06/02/2019 1.8   Phosphorus (mg/dL)  Date Value  06/02/2019 3.3   Calcium (mg/dL)  Date Value  06/02/2019 8.1 ([name])   Albumin (g/dL)  Date Value  05/28/2019 3.6    Assessment/Plan: Will order potassium 4mEq PO x 1 and magnesium 2g IV x 2.   While in ICU, will continue to replace for goal potassium ~ 4 and goal magnesium ~ 2.   Will obtain electrolytes with am labs. Patient will require IV replacement on 7/28 secondary to scheduled TEE.   Pharmacy will continue to monitor and adjust per consult.   Spencer [name],Spencer [name] 06/02/2019 4:12 PM

## 2019-06-02 NOTE — Progress Notes (Signed)
59 Spoke with Dr. Alice Reichert about patients GI status. Now NPO past midnight for EGD tomorrow.

## 2019-06-02 NOTE — Progress Notes (Signed)
CRITICAL CARE PROGRESS NOTE       SUBJECTIVE FINDINGS & SIGNIFICANT EVENTS   Patient clinically improved. DKA is resolved  Drug/alcohol withdrawal syndrome resolved   Cadndida bacteremia being treated with IV fluconazole , ID evaluation today.   Optimizing for downgrade to medical floor  GI eval - loose stools with +FOBT- recommendation for possible EGD/C-scope  Will transfer to floor.   No more melena today.   PAST MEDICAL HISTORY   History reviewed. No pertinent past medical history.   SURGICAL HISTORY   Unable to obtain surgical history   FAMILY HISTORY   No family history on file.   SOCIAL HISTORY   Social History   Tobacco Use  . Smoking status: Not on file  Substance Use Topics  . Alcohol use: Not on file  . Drug use: Not on file     MEDICATIONS   Current Medication:  Current Facility-Administered Medications:  .  acetaminophen (TYLENOL) tablet 650 mg, 650 mg, Oral, Q6H PRN **OR** acetaminophen (TYLENOL) suppository 650 mg, 650 mg, Rectal, Q6H PRN, Katha HammingKonidena, Snehalatha, MD, 650 mg at 05/29/19 2014 .  Chlorhexidine Gluconate Cloth 2 % PADS 6 each, 6 each, Topical, Daily, Eugenie NorrieBlakeney, Dana G, NP, 6 each at 06/01/19 0900 .  dextrose 50 % solution 25 mL, 25 mL, Intravenous, PRN, Willy Eddyobinson, Patrick, MD, 25 mL at 05/29/19 2354 .  dextrose 50 % solution 25 mL, 25 mL, Intravenous, Once, Eugenie NorrieBlakeney, Dana G, NP .  fluconazole (DIFLUCAN) IVPB 200 mg, 200 mg, Intravenous, Q24H, Myliah Medel, MD, Last Rate: 100 mL/hr at 06/01/19 2059, 200 mg at 06/01/19 2059 .  folic acid (FOLVITE) tablet 1 mg, 1 mg, Oral, Daily, Blakeney, Neldon Newportana G, NP, 1 mg at 06/01/19 0820 .  insulin aspart (novoLOG) injection 0-5 Units, 0-5 Units, Subcutaneous, QHS, Vida RiggerAleskerov, Keela Rubert, MD, 4 Units at 06/01/19 2009 .  insulin  aspart (novoLOG) injection 0-9 Units, 0-9 Units, Subcutaneous, TID WC, Vida RiggerAleskerov, Chandani Rogowski, MD, 1 Units at 06/02/19 0728 .  insulin aspart (novoLOG) injection 3 Units, 3 Units, Subcutaneous, TID WC, Vida RiggerAleskerov, Deyja Sochacki, MD, 3 Units at 06/02/19 0728 .  insulin glargine (LANTUS) injection 24 Units, 24 Units, Subcutaneous, QHS, Erin FullingKasa, Kurian, MD, 24 Units at 06/01/19 2010 .  multivitamin with minerals tablet 1 tablet, 1 tablet, Oral, Daily, Eugenie NorrieBlakeney, Dana G, NP, 1 tablet at 06/01/19 0820 .  ondansetron (ZOFRAN) tablet 4 mg, 4 mg, Oral, Q6H PRN **OR** ondansetron (ZOFRAN) injection 4 mg, 4 mg, Intravenous, Q6H PRN, Katha HammingKonidena, Snehalatha, MD, 4 mg at 05/30/19 1700 .  pantoprazole (PROTONIX) injection 40 mg, 40 mg, Intravenous, Q12H, Alford HighlandWieting, Richard, MD, 40 mg at 06/01/19 2009 .  thiamine (VITAMIN B-1) tablet 100 mg, 100 mg, Oral, Daily, 100 mg at 06/01/19 [id number] **OR** thiamine (B-1) injection 100 mg, 100 mg, Intravenous, Daily, Eugenie NorrieBlakeney, Dana G, NP, 100 mg at 05/31/19 1014    ALLERGIES   Patient has no known allergies.    REVIEW OF SYSTEMS     10 point ROS unable to obtain due to sedation  PHYSICAL EXAMINATION   Vitals:   06/01/19 2000 06/02/19 0100  BP:    Pulse:    Resp:    Temp: 97.7 F (36.5 C) 97.7 F (36.5 C)  SpO2:      GENERAL: Sedated HEAD: Normocephalic, atraumatic.  EYES: Pupils equal, round, reactive to light.  No scleral icterus.  MOUTH: Moist mucosal membrane. NECK: Supple. No thyromegaly. No nodules. No JVD.  PULMONARY: Clear to auscultation bilaterally CARDIOVASCULAR: S1 and S2. Regular rate  and rhythm. No murmurs, rubs, or gallops.  GASTROINTESTINAL: Soft, nontender, non-distended. No masses. Positive bowel sounds. No hepatosplenomegaly.  MUSCULOSKELETAL: No swelling, clubbing, or edema.  NEUROLOGIC: Mild distress due to acute illness SKIN:intact,warm,dry   LABS AND IMAGING       LAB RESULTS: Recent Labs  Lab 05/31/19 0037 06/01/19 0514 06/02/19 0228  NA  152* 145 145  K 3.4* 3.4* 3.6  CL 126* 116* 119*  CO2 19* 22 22  BUN 63* 39* 35*  CREATININE 2.89* 2.18* 2.14*  GLUCOSE 190* 231* 201*   Recent Labs  Lab 05/30/19 0446 05/31/19 0554 06/01/19 1027 06/02/19 0228  HGB 11.7* 10.0* 10.4* 8.3*  HCT 32.5* 28.1* 30.5* 24.6*  WBC 10.0 11.5*  --  7.8  PLT 71* 62*  --  77*     IMAGING RESULTS: No results found.    ASSESSMENT AND PLAN    -Multidisciplinary rounds held today  Diabetic ketoacidosis -Improved status post phase 1 and 2 -Currently sedated not on oral diet   Candida bacteremia -Continue with IV Diflucan -ID on case appreciate input ICU monitoring   Lower GI bleed     - unclear etiology     - h/h Stable     - melena resolved     - s/p GI evaluation appreciate recommendations    Acute on chronic renal failure    - likely due dehydration from GI losses     -starting IVF - LR 120cc/hr  -follow chem 7 -follow UO    NEUROLOGY - mentation improved no longer in acute drug/alcohol  withdrawal   ID -continue IV abx as prescibed -follow up cultures  GI/Nutrition GI PROPHYLAXIS as indicated DIET-->TF's as tolerated Constipation protocol as indicated  ENDO - ICU hypoglycemic\Hyperglycemia protocol -check FSBS per protocol   ELECTROLYTES -follow labs as needed -replace as needed -pharmacy consultation   DVT/GI PRX ordered -SCDs  TRANSFUSIONS AS NEEDED MONITOR FSBS ASSESS the need for LABS as needed   Critical care provider statement:    Critical care time (minutes):  33   Critical care time was exclusive of:  Separately billable procedures and treating other patients   Critical care was necessary to treat or prevent imminent or life-threatening deterioration of the following conditions:   Diabetic ketoacidosis, unresponsive state due to drug overdose, AKI stage II, Candida bacteremia, multiple comorbid conditions   Critical care was time spent personally by me on the following activities:   Development of treatment plan with patient or surrogate, discussions with consultants, evaluation of patient's response to treatment, examination of patient, obtaining history from patient or surrogate, ordering and performing treatments and interventions, ordering and review of laboratory studies and re-evaluation of patient's condition.  I assumed direction of critical care for this patient from another provider in my specialty: no    This document was prepared using Dragon voice recognition software and may include unintentional dictation errors.    Ottie Glazier, M.D.  Division of [facility] [facility]

## 2019-06-02 NOTE — Progress Notes (Signed)
Patient ID: Spencer [name], male   DOB: Jul 23, 1961, 58 y.o.   MRN: [id number]  [employer] PROGRESS NOTE  Spencer [name] CVE:[id number] DOB: [birth year] DOA: 05/28/2019 PCP: Jodi Marble, MD  HPI/Subjective: Patient denies any complaints    Objective: Vitals:   06/02/19 0100 06/02/19 0800  BP:  (!) 122/57  Pulse:  71  Resp:  (!) 22  Temp: 97.7 F (36.5 C) 98.2 F (36.8 C)  SpO2:  99%    Intake/Output Summary (Last 24 hours) at 06/02/2019 1500 Last data filed at 06/02/2019 0258 Gross per 24 hour  Intake 240 ml  Output 2725 ml  Net -2485 ml   Filed Weights   05/31/19 0400 06/01/19 0500 06/02/19 0400  Weight: 67.2 kg 65.3 kg 68.3 kg    ROS: Review of Systems  Constitutional: Negative for chills, fever, malaise/fatigue and weight loss.  HENT: Negative for ear discharge, ear pain, hearing loss, nosebleeds and tinnitus.   Eyes: Negative for blurred vision, double vision, photophobia, pain and discharge.  Respiratory: Negative for cough, hemoptysis, sputum production and shortness of breath.   Cardiovascular: Negative for chest pain, palpitations, orthopnea and leg swelling.  Gastrointestinal: Positive for melena. Negative for abdominal pain, blood in stool, constipation, diarrhea, heartburn, nausea and vomiting.  Genitourinary: Negative for dysuria, frequency, hematuria and urgency.  Musculoskeletal: Negative for back pain, falls, joint pain, myalgias and neck pain.  Skin: Negative for itching.  Neurological: Negative for dizziness, tingling, tremors, sensory change and headaches.  Endo/Heme/Allergies: Negative for environmental allergies and polydipsia. Does not bruise/bleed easily.  Psychiatric/Behavioral: Negative for depression and suicidal ideas.   Exam: Physical Exam  HENT:  Nose: No mucosal edema.  Eyes: Pupils are equal, round, and reactive to light. Lids are normal.  Neck: Carotid bruit is not present. No thyromegaly present.  Cardiovascular: Regular rhythm, S1  normal, S2 normal and normal heart sounds.  Pulses:      Dorsalis pedis pulses are 1+ on the right side and 1+ on the left side.  Respiratory: He has no decreased breath sounds. He has no wheezes. He has no rhonchi. He has no rales.  GI: Soft. Bowel sounds are normal. There is no abdominal tenderness.  Musculoskeletal:     Right ankle: He exhibits no swelling.     Left ankle: He exhibits no swelling.  Skin: Skin is warm. No rash noted. Nails show no clubbing.      Data Reviewed: Basic Metabolic Panel: Recent Labs  Lab 05/29/19 0526  05/29/19 1343 05/30/19 0446 05/31/19 0037 06/01/19 0514 06/02/19 0228  NA 149*   < > 150* 151* 152* 145 145  K 3.4*   < > 3.2* 3.8 3.4* 3.4* 3.6  CL 121*   < > 125* 128* 126* 116* 119*  CO2 11*   < > 16* 17* 19* 22 22  GLUCOSE 472*   < > 253* 151* 190* 231* 201*  BUN 81*   < > 88* 82* 63* 39* 35*  CREATININE 3.68*   < > 3.30* 3.12* 2.89* 2.18* 2.14*  CALCIUM 7.5*   < > 7.8* 8.5* 8.9 8.1* 8.1*  MG 2.1  --   --  1.8 2.2 1.8 1.8  PHOS <1.0*  --   --  1.6* 1.8* 3.4 3.3   < > = values in this interval not displayed.   Liver Function Tests: Recent Labs  Lab 05/28/19 1638  AST 32  ALT 21  ALKPHOS 143*  BILITOT 1.9*  PROT 5.7*  ALBUMIN 3.6  CBC: Recent Labs  Lab 05/28/19 1638 05/29/19 0526 05/29/19 1344 05/30/19 0446 05/31/19 0554 06/01/19 1027 06/02/19 0228 06/02/19 1028  WBC 24.6* 7.5 8.1 10.0 11.5*  --  7.8  --   NEUTROABS 20.7*  --  7.3  --   --   --   --   --   HGB 13.5 10.6* 11.4* 11.7* 10.0* 10.4* 8.3* 8.7*  HCT 45.1 29.9* 31.2* 32.5* 28.1* 30.5* 24.6* 25.7*  MCV 108.4* 90.9 87.9 87.8 88.6  --  93.5  --   PLT 208 80* 74* 71* 62*  --  77*  --     CBG: Recent Labs  Lab 06/01/19 1944 06/01/19 2327 06/02/19 0421 06/02/19 0713 06/02/19 1118  GLUCAP 387* 290* 200* 140* 243*    Recent Results (from the past 240 hour(s))  Blood Culture (routine x 2)     Status: Abnormal (Preliminary result)   Collection Time: 05/28/19   4:38 PM   Specimen: BLOOD  Result Value Ref Range Status   Specimen Description   Final    BLOOD BLOOD LEFT ARM Performed at [facility], [address] [place], [place] [address]    Special Requests   Final    BOTTLES DRAWN AEROBIC AND ANAEROBIC Blood Culture adequate volume Performed at [facility], [address] [place], [place] [address]    Culture  Setup Time   Final    YEAST AEROBIC BOTTLE ONLY CRITICAL RESULT CALLED TO, READ BACK BY AND VERIFIED WITH: JASON ROBBINS ON 05/29/2019 AT 2057 QSD Performed at [facility], [address] [place], [place] [address]    Culture CANDIDA ALBICANS (A)  Final   Report Status PENDING  Incomplete  Blood Culture (routine x 2)     Status: Abnormal   Collection Time: 05/28/19  4:38 PM   Specimen: BLOOD  Result Value Ref Range Status   Specimen Description   Final    BLOOD BLOOD RIGHT ARM Performed at [facility], [address] [place], [place] [address]    Special Requests   Final    BOTTLES DRAWN AEROBIC AND ANAEROBIC Blood Culture adequate volume Performed at [facility], [address] [place], [place] [address]    Culture  Setup Time   Final    YEAST IN BOTH AEROBIC AND ANAEROBIC BOTTLES CRITICAL RESULT CALLED TO, READ BACK BY AND VERIFIED WITH: JASON ROBBINS ON 05/29/2019 AT 2057 QSD Performed at [facility], [address] [place], [place] [address]    Culture CANDIDA ALBICANS (A)  Final   Report Status 06/01/2019 FINAL  Final  Urine culture     Status: None   Collection Time: 05/28/19  4:38 PM   Specimen: In/Out Cath Urine  Result Value Ref Range Status   Specimen Description   Final    IN/OUT CATH URINE Performed at [facility], [address] [place], [place] [address]    Special Requests   Final    NONE Performed at [facility], [address] [place], [place] [address]    Culture   Final    NO GROWTH Performed at [facility], [address] [place], [place] [address]    Report Status 05/29/2019 FINAL  Final  SARS Coronavirus 2 (CEPHEID- Performed in [facility]), Hosp Order     Status: None   Collection Time: 05/28/19  4:38 PM   Specimen: Nasopharyngeal Swab  Result Value Ref Range Status  SARS Coronavirus 2 NEGATIVE NEGATIVE Final    Comment: (NOTE) If result is NEGATIVE SARS-CoV-2 target nucleic acids are NOT DETECTED. The SARS-CoV-2 RNA is generally detectable in upper and lower  respiratory specimens during the acute phase of infection. The lowest  concentration of SARS-CoV-2 viral copies this assay can detect is 250  copies / mL. A negative result does not preclude SARS-CoV-2 infection  and should not be used as the sole basis for treatment or other  patient management decisions.  A negative result may occur with  improper specimen collection / handling, submission of specimen other  than nasopharyngeal swab, presence of viral mutation(s) within the  areas targeted by this assay, and inadequate number of viral copies  (<250 copies / mL). A negative result must be combined with clinical  observations, patient history, and epidemiological information. If result is POSITIVE SARS-CoV-2 target nucleic acids are DETECTED. The SARS-CoV-2 RNA is generally detectable in upper and lower  respiratory specimens dur ing the acute phase of infection.  Positive  results are indicative of active infection with SARS-CoV-2.  Clinical  correlation with patient history and other diagnostic information is  necessary to determine patient infection status.  Positive results do  not rule out bacterial infection or co-infection with other viruses. If result is PRESUMPTIVE POSTIVE SARS-CoV-2 nucleic acids MAY BE PRESENT.   A presumptive positive result was obtained on the submitted specimen  and confirmed on repeat testing.  While 2019 novel coronavirus  (SARS-CoV-2) nucleic acids may be present in  the submitted sample  additional confirmatory testing may be necessary for epidemiological  and / or clinical management purposes  to differentiate between  SARS-CoV-2 and other Sarbecovirus currently known to infect humans.  If clinically indicated additional testing with an alternate test  methodology [phone]([id number]) is advised. The SARS-CoV-2 RNA is generally  detectable in upper and lower respiratory sp ecimens during the acute  phase of infection. The expected result is Negative. Fact Sheet for Patients:  BoilerBrush.com.cyhttps://www.fda.gov/media/136312/download Fact Sheet for Healthcare Providers: https://pope.com/https://www.fda.gov/media/136313/download This test is not yet approved or cleared by the [place] FDA and has been authorized for detection and/or diagnosis of SARS-CoV-2 by FDA under an Emergency Use Authorization (EUA).  This EUA will remain in effect (meaning this test can be used) for the duration of the COVID-19 declaration under Section 564(b)(1) of the Act, 21 U.S.C. section 360bbb-3(b)(1), unless the authorization is terminated or revoked sooner. Performed at [facility], [address] [place], [place] [address]   Blood Culture ID Panel (Reflexed)     Status: Abnormal   Collection Time: 05/28/19  4:38 PM  Result Value Ref Range Status   Enterococcus species NOT DETECTED NOT DETECTED Final   Listeria monocytogenes NOT DETECTED NOT DETECTED Final   Staphylococcus species NOT DETECTED NOT DETECTED Final   Staphylococcus aureus (BCID) NOT DETECTED NOT DETECTED Final   Streptococcus species NOT DETECTED NOT DETECTED Final   Streptococcus agalactiae NOT DETECTED NOT DETECTED Final   Streptococcus pneumoniae NOT DETECTED NOT DETECTED Final   Streptococcus pyogenes NOT DETECTED NOT DETECTED Final   Acinetobacter baumannii NOT DETECTED NOT DETECTED Final   Enterobacteriaceae species NOT DETECTED NOT DETECTED Final   Enterobacter cloacae complex NOT DETECTED NOT DETECTED Final    Escherichia coli NOT DETECTED NOT DETECTED Final   Klebsiella oxytoca NOT DETECTED NOT DETECTED Final   Klebsiella pneumoniae NOT DETECTED NOT DETECTED Final   Proteus species NOT DETECTED NOT DETECTED Final   Serratia marcescens NOT DETECTED  NOT DETECTED Final   Haemophilus influenzae NOT DETECTED NOT DETECTED Final   Neisseria meningitidis NOT DETECTED NOT DETECTED Final   Pseudomonas aeruginosa NOT DETECTED NOT DETECTED Final   Candida albicans DETECTED (A) NOT DETECTED Final    Comment: CRITICAL RESULT CALLED TO, READ BACK BY AND VERIFIED WITH: JASON ROBBINS ON 05/29/2019 AT 2057 QSD    Candida glabrata NOT DETECTED NOT DETECTED Final   Candida krusei NOT DETECTED NOT DETECTED Final   Candida parapsilosis NOT DETECTED NOT DETECTED Final   Candida tropicalis NOT DETECTED NOT DETECTED Final    Comment: Performed at [facility], [address] [place], [place] [address]  MRSA PCR Screening     Status: None   Collection Time: 05/28/19  9:11 PM   Specimen: Nasopharyngeal  Result Value Ref Range Status   MRSA by PCR NEGATIVE NEGATIVE Final    Comment:        The GeneXpert MRSA Assay (FDA approved for NASAL specimens only), is one component of a comprehensive MRSA colonization surveillance program. It is not intended to diagnose MRSA infection nor to guide or monitor treatment for MRSA infections. Performed at [facility], [address] [place], [place] [address]   CULTURE, BLOOD (ROUTINE X 2) w Reflex to ID Panel     Status: None (Preliminary result)   Collection Time: 05/31/19 12:24 AM   Specimen: BLOOD  Result Value Ref Range Status   Specimen Description BLOOD BLOOD RIGHT FOREARM  Final   Special Requests   Final    BOTTLES DRAWN AEROBIC AND ANAEROBIC Blood Culture results may not be optimal due to an excessive volume of blood received in culture bottles   Culture   Final    NO GROWTH 2 DAYS Performed at [facility], [address],  [place], [place] [address]    Report Status PENDING  Incomplete  CULTURE, BLOOD (ROUTINE X 2) w Reflex to ID Panel     Status: None (Preliminary result)   Collection Time: 05/31/19 12:35 AM   Specimen: BLOOD  Result Value Ref Range Status   Specimen Description BLOOD BLOOD RIGHT HAND  Final   Special Requests   Final    BOTTLES DRAWN AEROBIC AND ANAEROBIC Blood Culture adequate volume   Culture   Final    NO GROWTH 2 DAYS Performed at [facility], [address] [place], [place] [address]    Report Status PENDING  Incomplete     Studies: No results found.  Scheduled Meds: . Chlorhexidine Gluconate Cloth  6 each Topical Daily  . folic acid  1 mg Oral Daily  . insulin aspart  0-5 Units Subcutaneous QHS  . insulin aspart  0-9 Units Subcutaneous TID WC  . insulin aspart  3 Units Subcutaneous TID WC  . insulin glargine  24 Units Subcutaneous QHS  . multivitamin with minerals  1 tablet Oral Daily  . pantoprazole  40 mg Oral Daily  . thiamine  100 mg Oral Daily   Continuous Infusions: . fluconazole (DIFLUCAN) IV 200 mg (06/01/19 2059)  . lactated ringers      Assessment/Plan:  1. Sepsis with Candida.  Continue Diflucan.  Echocardiogram negative appreciate ID input, per ID recommend TEE cardiology has been consulted 2. acute metabolic encephalopathy.  Resolved  3. diabetic ketoacidosis.  Patient switched off insulin drip onto Lantus insulin and sliding scale.  Hemoglobin A1c elevated at 13.8. 4. Dark tarry stools persistent seen by GI may need endoscopy 5. Drop in  platelets.  Platelets improving 6. Acute kidney injury likely from volume depletion and diabetic ketoacidosis.  Patient on bicarb drip.  Renal function stable appreciate nephrology input 7. Hypokalemia and hypophosphatemia.  Pharmacist electrolyte protocol 8.  Hypernatremia.  Sodium improved Code Status:     Code Status Orders  (From admission, onward)         Start     Ordered   05/28/19 1838  Full code   Continuous     05/28/19 1845        Code Status History    This patient has a current code status but no historical code status.   Advance Care Planning Activity     Family Communication: As per critical care team Disposition Plan: To be determined  Consultants:  Critical care specialist  Nephrology  Antibiotics:  Diflucan  Time spent: 27 minutes, case discussed with critical care team  [facility] Ashyia Schraeder  [employer]

## 2019-06-02 NOTE — Progress Notes (Signed)
[facility]  ROUNDING NOTE   Subjective:   Reports dark stools yesterday. No GI complaints today.   Feels weak and tired.   Objective:  Vital signs in last 24 hours:  Temp:  [97.6 F (36.4 C)-98.2 F (36.8 C)] 98.2 F (36.8 C) (07/27 0800) Pulse Rate:  [81-88] 85 (07/26 1700) Resp:  [19-23] 21 (07/26 1700) BP: (80-122)/(45-68) 122/57 (07/27 0800) SpO2:  [96 %-100 %] 96 % (07/26 1700) Weight:  [68.3 kg] 68.3 kg (07/27 0400)  Weight change: 3 kg Filed Weights   05/31/19 0400 06/01/19 0500 06/02/19 0400  Weight: 67.2 kg 65.3 kg 68.3 kg    Intake/Output: I/O last 3 completed shifts: In: 1900 [P.O.:1800; IV Piggyback:100] Out: 5150 [Urine:5150]   Intake/Output this shift:  Total I/O In: 120 [P.O.:120] Out: 200 [Urine:200]  Physical Exam: General: NAD, laying in bed  Head: Normocephalic, atraumatic. Moist oral mucosal membranes, poor dentition  Eyes: Anicteric, PERRL  Neck: Supple, trachea midline  Lungs:  Clear to auscultation  Heart: Regular rate and rhythm  Abdomen:  Soft, nontender  Extremities:  no peripheral edema.  Neurologic: Nonfocal, moving all four extremities  Skin: No lesions  Access: none    Basic Metabolic Panel: Recent Labs  Lab 05/29/19 0526  05/29/19 1343 05/30/19 0446 05/31/19 0037 06/01/19 0514 06/02/19 0228  NA 149*   < > 150* 151* 152* 145 145  K 3.4*   < > 3.2* 3.8 3.4* 3.4* 3.6  CL 121*   < > 125* 128* 126* 116* 119*  CO2 11*   < > 16* 17* 19* 22 22  GLUCOSE 472*   < > 253* 151* 190* 231* 201*  BUN 81*   < > 88* 82* 63* 39* 35*  CREATININE 3.68*   < > 3.30* 3.12* 2.89* 2.18* 2.14*  CALCIUM 7.5*   < > 7.8* 8.5* 8.9 8.1* 8.1*  MG 2.1  --   --  1.8 2.2 1.8 1.8  PHOS <1.0*  --   --  1.6* 1.8* 3.4 3.3   < > = values in this interval not displayed.    Liver Function Tests: Recent Labs  Lab 05/28/19 1638  AST 32  ALT 21  ALKPHOS 143*  BILITOT 1.9*  PROT 5.7*  ALBUMIN 3.6   No results for input(s): LIPASE,  AMYLASE in the last 168 hours. No results for input(s): AMMONIA in the last 168 hours.  CBC: Recent Labs  Lab 05/28/19 1638 05/29/19 0526 05/29/19 1344 05/30/19 0446 05/31/19 0554 06/01/19 1027 06/02/19 0228  WBC 24.6* 7.5 8.1 10.0 11.5*  --  7.8  NEUTROABS 20.7*  --  7.3  --   --   --   --   HGB 13.5 10.6* 11.4* 11.7* 10.0* 10.4* 8.3*  HCT 45.1 29.9* 31.2* 32.5* 28.1* 30.5* 24.6*  MCV 108.4* 90.9 87.9 87.8 88.6  --  93.5  PLT 208 80* 74* 71* 62*  --  77*    Cardiac Enzymes: No results for input(s): CKTOTAL, CKMB, CKMBINDEX, TROPONINI in the last 168 hours.  BNP: Invalid input(s): POCBNP  CBG: Recent Labs  Lab 06/01/19 1614 06/01/19 1944 06/01/19 2327 06/02/19 0421 06/02/19 0713  GLUCAP 474* 387* 290* 200* 140*    Microbiology: Results for orders placed or performed during the hospital encounter of 05/28/19  Blood Culture (routine x 2)     Status: Abnormal (Preliminary result)   Collection Time: 05/28/19  4:38 PM   Specimen: BLOOD  Result Value Ref Range Status  Specimen Description   Final    BLOOD BLOOD LEFT ARM Performed at [facility], [address] [place], [place] [address]    Special Requests   Final    BOTTLES DRAWN AEROBIC AND ANAEROBIC Blood Culture adequate volume Performed at [facility], [address] [place], [place] [address]    Culture  Setup Time   Final    YEAST AEROBIC BOTTLE ONLY CRITICAL RESULT CALLED TO, READ BACK BY AND VERIFIED WITH: JASON ROBBINS ON 05/29/2019 AT 2057 QSD Performed at [facility], [address] [place], [place] [address]    Culture CANDIDA ALBICANS (A)  Final   Report Status PENDING  Incomplete  Blood Culture (routine x 2)     Status: Abnormal   Collection Time: 05/28/19  4:38 PM   Specimen: BLOOD  Result Value Ref Range Status   Specimen Description   Final    BLOOD BLOOD RIGHT ARM Performed at [facility], [address] [place], [place] [address]     Special Requests   Final    BOTTLES DRAWN AEROBIC AND ANAEROBIC Blood Culture adequate volume Performed at [facility], [address] [place], [place] [address]    Culture  Setup Time   Final    YEAST IN BOTH AEROBIC AND ANAEROBIC BOTTLES CRITICAL RESULT CALLED TO, READ BACK BY AND VERIFIED WITH: JASON ROBBINS ON 05/29/2019 AT 2057 QSD Performed at [facility], [address] [place], [place] [address]    Culture CANDIDA ALBICANS (A)  Final   Report Status 06/01/2019 FINAL  Final  Urine culture     Status: None   Collection Time: 05/28/19  4:38 PM   Specimen: In/Out Cath Urine  Result Value Ref Range Status   Specimen Description   Final    IN/OUT CATH URINE Performed at [facility], [address] [place], [place] [address]    Special Requests   Final    NONE Performed at [facility], [address] [place], [place] [address]    Culture   Final    NO GROWTH Performed at [facility], [address] [place], [place] [address]    Report Status 05/29/2019 FINAL  Final  SARS Coronavirus 2 (CEPHEID- Performed in [facility] lab), Hosp Order     Status: None   Collection Time: 05/28/19  4:38 PM   Specimen: Nasopharyngeal Swab  Result Value Ref Range Status   SARS Coronavirus 2 NEGATIVE NEGATIVE Final    Comment: (NOTE) If result is NEGATIVE SARS-CoV-2 target nucleic acids are NOT DETECTED. The SARS-CoV-2 RNA is generally detectable in upper and lower  respiratory specimens during the acute phase of infection. The lowest  concentration of SARS-CoV-2 viral copies this assay can detect is 250  copies / mL. A negative result does not preclude SARS-CoV-2 infection  and should not be used as the sole basis for treatment or other  patient management decisions.  A negative result may occur with  improper specimen collection / handling, submission of specimen other  than nasopharyngeal swab, presence of viral mutation(s) within  the  areas targeted by this assay, and inadequate number of viral copies  (<250 copies / mL). A negative result must be combined with clinical  observations, patient history, and epidemiological information. If result is POSITIVE SARS-CoV-2 target nucleic acids are DETECTED. The SARS-CoV-2 RNA is generally detectable in upper and lower  respiratory specimens dur ing the acute phase of  infection.  Positive  results are indicative of active infection with SARS-CoV-2.  Clinical  correlation with patient history and other diagnostic information is  necessary to determine patient infection status.  Positive results do  not rule out bacterial infection or co-infection with other viruses. If result is PRESUMPTIVE POSTIVE SARS-CoV-2 nucleic acids MAY BE PRESENT.   A presumptive positive result was obtained on the submitted specimen  and confirmed on repeat testing.  While 2019 novel coronavirus  (SARS-CoV-2) nucleic acids may be present in the submitted sample  additional confirmatory testing may be necessary for epidemiological  and / or clinical management purposes  to differentiate between  SARS-CoV-2 and other Sarbecovirus currently known to infect humans.  If clinically indicated additional testing with an alternate test  methodology [phone]) is advised. The SARS-CoV-2 RNA is generally  detectable in upper and lower respiratory sp ecimens during the acute  phase of infection. The expected result is Negative. Fact Sheet for Patients:  StrictlyIdeas.no Fact Sheet for Healthcare Providers: BankingDealers.co.za This test is not yet approved or cleared by the [place] FDA and has been authorized for detection and/or diagnosis of SARS-CoV-2 by FDA under an Emergency Use Authorization (EUA).  This EUA will remain in effect (meaning this test can be used) for the duration of the COVID-19 declaration under Section 564(b)(1) of the Act, 21  U.S.C. section 360bbb-3(b)(1), unless the authorization is terminated or revoked sooner. Performed at [facility], [address] [place], [place] [address]   Blood Culture ID Panel (Reflexed)     Status: Abnormal   Collection Time: 05/28/19  4:38 PM  Result Value Ref Range Status   Enterococcus species NOT DETECTED NOT DETECTED Final   Listeria monocytogenes NOT DETECTED NOT DETECTED Final   Staphylococcus species NOT DETECTED NOT DETECTED Final   Staphylococcus aureus (BCID) NOT DETECTED NOT DETECTED Final   Streptococcus species NOT DETECTED NOT DETECTED Final   Streptococcus agalactiae NOT DETECTED NOT DETECTED Final   Streptococcus pneumoniae NOT DETECTED NOT DETECTED Final   Streptococcus pyogenes NOT DETECTED NOT DETECTED Final   Acinetobacter baumannii NOT DETECTED NOT DETECTED Final   Enterobacteriaceae species NOT DETECTED NOT DETECTED Final   Enterobacter cloacae complex NOT DETECTED NOT DETECTED Final   Escherichia coli NOT DETECTED NOT DETECTED Final   Klebsiella oxytoca NOT DETECTED NOT DETECTED Final   Klebsiella pneumoniae NOT DETECTED NOT DETECTED Final   Proteus species NOT DETECTED NOT DETECTED Final   Serratia marcescens NOT DETECTED NOT DETECTED Final   Haemophilus influenzae NOT DETECTED NOT DETECTED Final   Neisseria meningitidis NOT DETECTED NOT DETECTED Final   Pseudomonas aeruginosa NOT DETECTED NOT DETECTED Final   Candida albicans DETECTED (A) NOT DETECTED Final    Comment: CRITICAL RESULT CALLED TO, READ BACK BY AND VERIFIED WITH: JASON ROBBINS ON 05/29/2019 AT 2057 QSD    Candida glabrata NOT DETECTED NOT DETECTED Final   Candida krusei NOT DETECTED NOT DETECTED Final   Candida parapsilosis NOT DETECTED NOT DETECTED Final   Candida tropicalis NOT DETECTED NOT DETECTED Final    Comment: Performed at [facility], [address] [place], [place] [address]  MRSA PCR Screening     Status: None   Collection Time: 05/28/19  9:11 PM    Specimen: Nasopharyngeal  Result Value Ref Range Status   MRSA by PCR NEGATIVE NEGATIVE Final    Comment:        The GeneXpert MRSA Assay (FDA approved for NASAL specimens only), is one component  of a comprehensive MRSA colonization surveillance program. It is not intended to diagnose MRSA infection nor to guide or monitor treatment for MRSA infections. Performed at [facility], [address] [place], [place] [address]   CULTURE, BLOOD (ROUTINE X 2) w Reflex to ID Panel     Status: None (Preliminary result)   Collection Time: 05/31/19 12:24 AM   Specimen: BLOOD  Result Value Ref Range Status   Specimen Description BLOOD BLOOD RIGHT FOREARM  Final   Special Requests   Final    BOTTLES DRAWN AEROBIC AND ANAEROBIC Blood Culture results may not be optimal due to an excessive volume of blood received in culture bottles   Culture   Final    NO GROWTH 2 DAYS Performed at [facility], [address] [place], [place] [address]    Report Status PENDING  Incomplete  CULTURE, BLOOD (ROUTINE X 2) w Reflex to ID Panel     Status: None (Preliminary result)   Collection Time: 05/31/19 12:35 AM   Specimen: BLOOD  Result Value Ref Range Status   Specimen Description BLOOD BLOOD RIGHT HAND  Final   Special Requests   Final    BOTTLES DRAWN AEROBIC AND ANAEROBIC Blood Culture adequate volume   Culture   Final    NO GROWTH 2 DAYS Performed at [facility], [address] [place], [place] [address]    Report Status PENDING  Incomplete    Coagulation Studies: No results for input(s): LABPROT, INR in the last 72 hours.  Urinalysis: No results for input(s): COLORURINE, LABSPEC, PHURINE, GLUCOSEU, HGBUR, BILIRUBINUR, KETONESUR, PROTEINUR, UROBILINOGEN, NITRITE, LEUKOCYTESUR in the last 72 hours.  Invalid input(s): APPERANCEUR    Imaging: No results found.   Medications:   . fluconazole (DIFLUCAN) IV 200 mg (06/01/19 2059)   . Chlorhexidine  Gluconate Cloth  6 each Topical Daily  . dextrose  25 mL Intravenous Once  . folic acid  1 mg Oral Daily  . insulin aspart  0-5 Units Subcutaneous QHS  . insulin aspart  0-9 Units Subcutaneous TID WC  . insulin aspart  3 Units Subcutaneous TID WC  . insulin glargine  24 Units Subcutaneous QHS  . multivitamin with minerals  1 tablet Oral Daily  . pantoprazole  40 mg Oral Daily  . thiamine  100 mg Oral Daily   acetaminophen **OR** acetaminophen, dextrose, ondansetron **OR** ondansetron (ZOFRAN) IV  Assessment/ Plan:  Mr. Spencer [name] is a 58 y.o. white male withdiabetes mellitus type 2, who [facility]2020for evaluation of sepsis with fungemia,altered mental status. Found to have DKA, GI bleed requiring PRBC transfusion and acute renal failure.   1. Acute renal failure secondary to severe volume depletion and diabetic ketoacidosis.  Unknown baseline creatinine. Get records from PCP.  - Off IV fluids. Nonoliguric urine output. No indication for dialysis.   2. Metabolic acidosis: secondary to diabetic ketoacidosis.   3. 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 Hemoglobin A1c of 13.8% on 7/24.   4. Anemia with renal failure: concern for GI bleed. Hemoglobin dropped to 8.3 from 10.4  5. Sepsis: fungemia: candida in blood cultures on 7/22. Hemodynamically stable. Off vasopressors.  - fluconazole.    LOS: 5 Spencer [name] 7/27/20209:59 AM
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
## 2019-06-02 NOTE — Progress Notes (Signed)
Pharmacy Antibiotic Note  Spencer [name] is a 58 y.o. male admitted on 05/28/2019 with candidemia.  Pharmacy has been consulted for fluconazole dosing.  Patient received Fluconazole 800mg  IV x 1 dose on 7/23   Plan: Continue  fluconazole 200 mg IV Q24H   Height: 6\' 3"  (190.5 cm) Weight: 150 lb 9.2 oz (68.3 kg) IBW/kg (Calculated) : 84.5  Temp (24hrs), Avg:97.9 F (36.6 C), Min:97.7 F (36.5 C), Max:98.2 F (36.8 C)  Recent Labs  Lab 05/28/19 1638 05/28/19 2124 05/29/19 0140 05/29/19 0526  05/29/19 1343 05/29/19 1344 05/30/19 0446 05/31/19 0037 05/31/19 0554 06/01/19 0514 06/02/19 0228  WBC 24.6*  --   --  7.5  --   --  8.1 10.0  --  11.5*  --  7.8  CREATININE 4.05* 3.99* 3.79* 3.68*   < > 3.30*  --  3.12* 2.89*  --  2.18* 2.14*  LATICACIDVEN 3.7* 2.1* 2.1*  --   --   --   --   --   --   --   --   --    < > = values in this interval not displayed.    Estimated Creatinine Clearance: 35.5 mL/min (A) (by C-G formula based on SCr of 2.14 mg/dL (H)).    No Known Allergies  Antimicrobials this admission: 7/22 cefepime  >> 7/24 7/22 Vancomycin x 1 7/23 Fluconazole>>   Microbiology results:   BCx:  Candida albicans in 2 of 4 bottles (7/23)  UCx:  No growth   MRSA PCR: Negative   Thank you for allowing pharmacy to be a part of this patient's care.  Aleaya Latona L, RPh 06/02/2019 4:31 PM

## 2019-06-02 NOTE — H&P (View-Only) (Signed)
[facility]  Subjective: Patient seen for follow-up of GI bleed.  Patient has not had any further melenic stools since yesterday.  Patient denies any abdominal pain, nausea, hematemesis.  He is tolerating a diet.  Orders are being made to transfer the patient to a regular medical bed.  Objective: Vital signs in last 24 hours: Temp:  [97.7 F (36.5 C)-98.2 F (36.8 C)] 98.2 F (36.8 C) (07/27 0800) Pulse Rate:  [71] 71 (07/27 0800) Resp:  [22] 22 (07/27 0800) BP: (122)/(57) 122/57 (07/27 0800) SpO2:  [99 %] 99 % (07/27 0800) Weight:  [68.3 kg] 68.3 kg (07/27 0400) Blood pressure (!) 122/57, pulse 71, temperature 98.2 F (36.8 C), resp. rate (!) 22, height 6\' 3"  (1.905 m), weight 68.3 kg, SpO2 99 %.    Intake/Output from previous day: 07/26 0701 - 07/27 0700 In: 600 [P.O.:600] Out: 3450 [Urine:3450]  Intake/Output this shift: Total I/O In: 120 [P.O.:120] Out: 200 [Urine:200]   General appearance: Alert, no acute distress.  Oriented x3. Resp: Clear to auscultation. Cardio: Regular rate, no gallop noted. GI: Soft, benign, no masses, nontender.  Bowel sounds positive Extremities: No edema.   Lab Results: Results for orders placed or performed during the hospital encounter of 05/28/19 (from the past 24 hour(s))  Glucose, capillary     Status: Abnormal   Collection Time: 06/01/19  7:44 PM  Result Value Ref Range   Glucose-Capillary 387 (H) 70 - 99 mg/dL  Glucose, capillary     Status: Abnormal   Collection Time: 06/01/19 11:27 PM  Result Value Ref Range   Glucose-Capillary 290 (H) 70 - 99 mg/dL  Basic metabolic panel     Status: Abnormal   Collection Time: 06/02/19  2:28 AM  Result Value Ref Range   Sodium 145 135 - 145 mmol/L   Potassium 3.6 3.5 - 5.1 mmol/L   Chloride 119 (H) 98 - 111 mmol/L   CO2 22 22 - 32 mmol/L   Glucose, Bld 201 (H) 70 - 99 mg/dL   BUN 35 (H) 6 - 20 mg/dL   Creatinine, Ser 1.612.14 (H) 0.61 - 1.24 mg/dL    Calcium 8.1 (L) 8.9 - 10.3 mg/dL   GFR calc non Af Amer 32 (L) >60 mL/min   GFR calc Af Amer 38 (L) >60 mL/min   Anion gap 4 (L) 5 - 15  Phosphorus     Status: None   Collection Time: 06/02/19  2:28 AM  Result Value Ref Range   Phosphorus 3.3 2.5 - 4.6 mg/dL  Magnesium     Status: None   Collection Time: 06/02/19  2:28 AM  Result Value Ref Range   Magnesium 1.8 1.7 - 2.4 mg/dL  CBC     Status: Abnormal   Collection Time: 06/02/19  2:28 AM  Result Value Ref Range   WBC 7.8 4.0 - 10.5 K/uL   RBC 2.63 (L) 4.22 - 5.81 MIL/uL   Hemoglobin 8.3 (L) 13.0 - 17.0 g/dL   HCT 09.624.6 (L) 04.539.0 - 40.952.0 %   MCV 93.5 80.0 - 100.0 fL   MCH 31.6 26.0 - 34.0 pg   MCHC 33.7 30.0 - 36.0 g/dL   RDW 81.114.1 91.411.5 - 78.215.5 %   Platelets 77 (L) 150 - 400 K/uL   nRBC 0.0 0.0 - 0.2 %  Glucose, capillary     Status: Abnormal   Collection Time: 06/02/19  4:21 AM  Result Value Ref Range   Glucose-Capillary 200 (H) 70 -  99 mg/dL  Glucose, capillary     Status: Abnormal   Collection Time: 06/02/19  7:13 AM  Result Value Ref Range   Glucose-Capillary 140 (H) 70 - 99 mg/dL  Hemoglobin and hematocrit, blood     Status: Abnormal   Collection Time: 06/02/19 10:28 AM  Result Value Ref Range   Hemoglobin 8.7 (L) 13.0 - 17.0 g/dL   HCT 25.7 (L) 39.0 - 52.0 %  Glucose, capillary     Status: Abnormal   Collection Time: 06/02/19 11:18 AM  Result Value Ref Range   Glucose-Capillary 243 (H) 70 - 99 mg/dL  Glucose, capillary     Status: Abnormal   Collection Time: 06/02/19  4:32 PM  Result Value Ref Range   Glucose-Capillary 220 (H) 70 - 99 mg/dL     Recent Labs    05/31/19 0554 06/01/19 1027 06/02/19 0228 06/02/19 1028  WBC 11.5*  --  7.8  --   HGB 10.0* 10.4* 8.3* 8.7*  HCT 28.1* 30.5* 24.6* 25.7*  PLT 62*  --  77*  --    BMET Recent Labs    05/31/19 0037 06/01/19 0514 06/02/19 0228  NA 152* 145 145  K 3.4* 3.4* 3.6  CL 126* 116* 119*  CO2 19* 22 22  GLUCOSE 190* 231* 201*  BUN 63* 39* 35*   CREATININE 2.89* 2.18* 2.14*  CALCIUM 8.9 8.1* 8.1*   LFT No results for input(s): PROT, ALBUMIN, AST, ALT, ALKPHOS, BILITOT, BILIDIR, IBILI in the last 72 hours. PT/INR No results for input(s): LABPROT, INR in the last 72 hours. Hepatitis Panel No results for input(s): HEPBSAG, HCVAB, HEPAIGM, HEPBIGM in the last 72 hours. C-Diff No results for input(s): CDIFFTOX in the last 72 hours. No results for input(s): CDIFFPCR in the last 72 hours.   Studies/Results: No results found.  Scheduled Inpatient Medications:   . Chlorhexidine Gluconate Cloth  6 each Topical Daily  . folic acid  1 mg Oral Daily  . insulin aspart  0-5 Units Subcutaneous QHS  . insulin aspart  0-9 Units Subcutaneous TID WC  . insulin aspart  6 Units Subcutaneous TID WC  . insulin glargine  24 Units Subcutaneous QHS  . multivitamin with minerals  1 tablet Oral Daily  . pantoprazole  40 mg Oral Daily  . thiamine  100 mg Oral Daily    Continuous Inpatient Infusions:   . sodium chloride    . sodium chloride    . fluconazole (DIFLUCAN) IV 200 mg (06/01/19 2059)  . magnesium sulfate bolus IVPB 2 g (06/02/19 1745)    PRN Inpatient Medications:  acetaminophen **OR** acetaminophen, dextrose, ondansetron **OR** ondansetron (ZOFRAN) IV   Assessment:  1.  Melena-consider upper GI source such as peptic ulcer disease, gastritis.  Symptoms resolved at present.  2.  Anemia secondary to gastrointestinal blood loss.  Markedly decreased hemoglobin from baseline..  Hemoglobin stable, however at 8.7.  3.  DKA-resolving.  4.  Acute kidney injury with underlying chronic renal insufficiency- stable, per nephrology.  Good urine output.  5.  Sepsis- fungal.  Cardiology consulted and planned transesophageal echocardiogram tomorrow morning.  Plan:  1.  Proceed with TEE as scheduled. 2.  Continue serial  H&H. 3.  Continue acid suppression. 4.  We will tentatively plan EGD for Wednesday 7/29. The patient understands the  nature of the planned procedure, indications, risks, alternatives and potential complications including but not limited to bleeding, infection, perforation, damage to internal organs and possible oversedation/side effects from anesthesia. The patient   agrees and gives consent to proceed.  Please refer to procedure notes for findings, recommendations and patient disposition/instructions.  5.  Following along.  Chrystle Murillo K. Alice Reichert, M.D. 06/02/2019, 5:54 PM

## 2019-06-02 NOTE — Progress Notes (Signed)
[facility]  Subjective: Patient seen for follow-up of GI bleed.  Patient has not had any further melenic stools since yesterday.  Patient denies any abdominal pain, nausea, hematemesis.  He is tolerating a diet.  Orders are being made to transfer the patient to a regular medical bed.  Objective: Vital signs in last 24 hours: Temp:  [97.7 F (36.5 C)-98.2 F (36.8 C)] 98.2 F (36.8 C) (07/27 0800) Pulse Rate:  [71] 71 (07/27 0800) Resp:  [22] 22 (07/27 0800) BP: (122)/(57) 122/57 (07/27 0800) SpO2:  [99 %] 99 % (07/27 0800) Weight:  [68.3 kg] 68.3 kg (07/27 0400) Blood pressure (!) 122/57, pulse 71, temperature 98.2 F (36.8 C), resp. rate (!) 22, height 6\' 3"  (1.905 m), weight 68.3 kg, SpO2 99 %.    Intake/Output from previous day: 07/26 0701 - 07/27 0700 In: 600 [P.O.:600] Out: 3450 [Urine:3450]  Intake/Output this shift: Total I/O In: 120 [P.O.:120] Out: 200 [Urine:200]   General appearance: Alert, no acute distress.  Oriented x3. Resp: Clear to auscultation. Cardio: Regular rate, no gallop noted. GI: Soft, benign, no masses, nontender.  Bowel sounds positive Extremities: No edema.   Lab Results: Results for orders placed or performed during the hospital encounter of 05/28/19 (from the past 24 hour(s))  Glucose, capillary     Status: Abnormal   Collection Time: 06/01/19  7:44 PM  Result Value Ref Range   Glucose-Capillary 387 (H) 70 - 99 mg/dL  Glucose, capillary     Status: Abnormal   Collection Time: 06/01/19 11:27 PM  Result Value Ref Range   Glucose-Capillary 290 (H) 70 - 99 mg/dL  Basic metabolic panel     Status: Abnormal   Collection Time: 06/02/19  2:28 AM  Result Value Ref Range   Sodium 145 135 - 145 mmol/L   Potassium 3.6 3.5 - 5.1 mmol/L   Chloride 119 (H) 98 - 111 mmol/L   CO2 22 22 - 32 mmol/L   Glucose, Bld 201 (H) 70 - 99 mg/dL   BUN 35 (H) 6 - 20 mg/dL   Creatinine, Ser 1.612.14 (H) 0.61 - 1.24 mg/dL    Calcium 8.1 (L) 8.9 - 10.3 mg/dL   GFR calc non Af Amer 32 (L) >60 mL/min   GFR calc Af Amer 38 (L) >60 mL/min   Anion gap 4 (L) 5 - 15  Phosphorus     Status: None   Collection Time: 06/02/19  2:28 AM  Result Value Ref Range   Phosphorus 3.3 2.5 - 4.6 mg/dL  Magnesium     Status: None   Collection Time: 06/02/19  2:28 AM  Result Value Ref Range   Magnesium 1.8 1.7 - 2.4 mg/dL  CBC     Status: Abnormal   Collection Time: 06/02/19  2:28 AM  Result Value Ref Range   WBC 7.8 4.0 - 10.5 K/uL   RBC 2.63 (L) 4.22 - 5.81 MIL/uL   Hemoglobin 8.3 (L) 13.0 - 17.0 g/dL   HCT 09.624.6 (L) 04.539.0 - 40.952.0 %   MCV 93.5 80.0 - 100.0 fL   MCH 31.6 26.0 - 34.0 pg   MCHC 33.7 30.0 - 36.0 g/dL   RDW 81.114.1 91.411.5 - 78.215.5 %   Platelets 77 (L) 150 - 400 K/uL   nRBC 0.0 0.0 - 0.2 %  Glucose, capillary     Status: Abnormal   Collection Time: 06/02/19  4:21 AM  Result Value Ref Range   Glucose-Capillary 200 (H) 70 -  99 mg/dL  Glucose, capillary     Status: Abnormal   Collection Time: 06/02/19  7:13 AM  Result Value Ref Range   Glucose-Capillary 140 (H) 70 - 99 mg/dL  Hemoglobin and hematocrit, blood     Status: Abnormal   Collection Time: 06/02/19 10:28 AM  Result Value Ref Range   Hemoglobin 8.7 (L) 13.0 - 17.0 g/dL   HCT 29.525.7 (L) 62.139.0 - 30.852.0 %  Glucose, capillary     Status: Abnormal   Collection Time: 06/02/19 11:18 AM  Result Value Ref Range   Glucose-Capillary 243 (H) 70 - 99 mg/dL  Glucose, capillary     Status: Abnormal   Collection Time: 06/02/19  4:32 PM  Result Value Ref Range   Glucose-Capillary 220 (H) 70 - 99 mg/dL     Recent Labs    65/78/4607/25/20 0554 06/01/19 1027 06/02/19 0228 06/02/19 1028  WBC 11.5*  --  7.8  --   HGB 10.0* 10.4* 8.3* 8.7*  HCT 28.1* 30.5* 24.6* 25.7*  PLT 62*  --  77*  --    BMET Recent Labs    05/31/19 0037 06/01/19 0514 06/02/19 0228  NA 152* 145 145  K 3.4* 3.4* 3.6  CL 126* 116* 119*  CO2 19* 22 22  GLUCOSE 190* 231* 201*  BUN 63* 39* 35*   CREATININE 2.89* 2.18* 2.14*  CALCIUM 8.9 8.1* 8.1*   LFT No results for input(s): PROT, ALBUMIN, AST, ALT, ALKPHOS, BILITOT, BILIDIR, IBILI in the last 72 hours. PT/INR No results for input(s): LABPROT, INR in the last 72 hours. Hepatitis Panel No results for input(s): HEPBSAG, HCVAB, HEPAIGM, HEPBIGM in the last 72 hours. C-Diff No results for input(s): CDIFFTOX in the last 72 hours. No results for input(s): CDIFFPCR in the last 72 hours.   Studies/Results: No results found.  Scheduled Inpatient Medications:   . Chlorhexidine Gluconate Cloth  6 each Topical Daily  . folic acid  1 mg Oral Daily  . insulin aspart  0-5 Units Subcutaneous QHS  . insulin aspart  0-9 Units Subcutaneous TID WC  . insulin aspart  6 Units Subcutaneous TID WC  . insulin glargine  24 Units Subcutaneous QHS  . multivitamin with minerals  1 tablet Oral Daily  . pantoprazole  40 mg Oral Daily  . thiamine  100 mg Oral Daily    Continuous Inpatient Infusions:   . sodium chloride    . sodium chloride    . fluconazole (DIFLUCAN) IV 200 mg (06/01/19 2059)  . magnesium sulfate bolus IVPB 2 g (06/02/19 1745)    PRN Inpatient Medications:  acetaminophen **OR** acetaminophen, dextrose, ondansetron **OR** ondansetron (ZOFRAN) IV   Assessment:  1.  Melena-consider upper GI source such as peptic ulcer disease, gastritis.  Symptoms resolved at present.  2.  Anemia secondary to gastrointestinal blood loss.  Markedly decreased hemoglobin from baseline..  Hemoglobin stable, however at 8.7.  3.  DKA-resolving.  4.  Acute kidney injury with underlying chronic renal insufficiency- stable, per nephrology.  Good urine output.  5.  Sepsis- fungal.  Cardiology consulted and planned transesophageal echocardiogram tomorrow morning.  Plan:  1.  Proceed with TEE as scheduled. 2.  Continue serial  H&H. 3.  Continue acid suppression. 4.  We will tentatively plan EGD for Wednesday 7/29. The patient understands the  nature of the planned procedure, indications, risks, alternatives and potential complications including but not limited to bleeding, infection, perforation, damage to internal organs and possible oversedation/side effects from anesthesia. The patient  agrees and gives consent to proceed.  Please refer to procedure notes for findings, recommendations and patient disposition/instructions.  5.  Following along.  Kalil Woessner K. Alice Reichert, M.D. 06/02/2019, 5:54 PM

## 2019-06-02 NOTE — Progress Notes (Addendum)
Inpatient Diabetes Program Recommendations  AACE/ADA: New Consensus Statement on Inpatient Glycemic Control   Target Ranges:  Prepandial:   less than 140 mg/dL      Peak postprandial:   less than 180 mg/dL (1-2 hours)      Critically ill patients:  140 - 180 mg/dL  Results for Spencer [name], Spencer [name] (MRN [id number]) as of 06/02/2019 08:04  Ref. Range 06/01/2019 07:39 06/01/2019 12:10 06/01/2019 16:14 06/01/2019 19:44 06/01/2019 23:27 06/02/2019 04:21 06/02/2019 07:13  Glucose-Capillary Latest Ref Range: 70 - 99 mg/dL 409188 (H) 811236 (H) 914474 (H) 387 (H) 290 (H) 200 (H) 140 (H)    Review of Glycemic Control  Diabetes history: DM2 Outpatient Diabetes medications: Lantus 24-40 units BID, Novolog 6-25 units TID with meals Current orders for Inpatient glycemic control: Lantus 24 units QHS, Novolog 0-9 TID with meals, Novolog 0-5 units QHS, Novolog 3 units TID with meals for meal coverage  Inpatient Diabetes Program Recommendations:  Insulin-Meal Coverage: Please consider increasing meal coverage to Novolog 6 units TID with meals if patient eats at least 50% of meals.  HbgA1C:  A1C 13.5% on 05/28/19 indicating an average glucose of 341 mg/dl over the past 2-3 months. Please make a referral to Dr. Gershon Crane'Connell (Endocrinologist) for outpatient follow up and to establish care.  NOTE: Noted consult for Inpatient Diabetes Coordinator. Noted merged chart (MRN [id number]). Patient is known to inpatient diabetes team and was last seen by our team on 02/08/19 during prior hospitalization. Patient was last in the hospital 02/18/19 to 02/19/19 and was discharged on Lantus 24 units daily and Novolog 6 units TID with meals for DM control.  Noted patient has Medicaid and patient should be able to get insulins for $4 copay.  Addendum 06/02/19@13 :00-Spoke with patient about diabetes and home regimen for diabetes control. Patient reports that he use to see an Endocrinologist but she left the area. Per chart review, noted patient use to see Dr.  Aliene AltesAbisogun (last seen in 2018).  Patient reports that he has everything at home that he needs for DM management. Patient reports that he takes Lantus 24-40 units BID and Novolog 6-25 units TID with meals for DM control. Patient states he is using insulin pens and he gives injections in the abdomen, rotating site. Examined abdomen and no hardened areas (scar tissue) noted. Patient reports he is storing insulin correctly to keep it cool.  Patient reports checking glucose 2-3 times per day and notes that his glucose is "all over the place; up and down, but mostly high."   Discussed A1C results (13.5% on 05/28/19) and explained that current A1C indicates an average glucose of 341 mg/dl over the past 2-3 months. Discussed glucose and A1C goals. Discussed importance of checking CBGs and maintaining good CBG control to prevent long-term and short-term complications. Explained how hyperglycemia leads to damage within blood vessels which lead to the common complications seen with uncontrolled diabetes. Stressed to the patient the importance of improving glycemic control to prevent further complications from uncontrolled diabetes. Discussed impact of nutrition, exercise, stress, sickness, and medications on diabetes control.  Patient notes that he eats whatever he wants and covers it with the insulin. Patient reports drinking mostly water. Encouraged patient to modify carbohydrates and follow carb modified diet.  Patient states he is willing to see another local Endocrinologist. Will ask attending MD to make a referral to Dr. Gershon Crane'Connell (as he took over Dr. Earley AbideAbisogun's patients when she left the area). Encouraged patient to get reestablished with Endocrinologist so they could help  get DM controlled.  Encouraged patient to check glucose 3-4 times per day, keep a record of exact insulin taken each time, and follow up with a provider.  Patient verbalized understanding of information discussed and reports no further questions at this  time related to diabetes.  Thanks, Spencer Alderman, Spencer [name], Spencer [name], CDE Diabetes Coordinator Inpatient Diabetes Program [phone] (Team Pager from 8am to 5pm)

## 2019-06-03 ENCOUNTER — Encounter
Admission: EM | Disposition: A | Discharge status: Home / Self Care | Payer: Self-pay | Source: Home / Self Care | Attending: Internal Medicine

## 2019-06-03 ENCOUNTER — Inpatient Hospital Stay: Payer: Medicaid Other | Admitting: Certified Registered"

## 2019-06-03 ENCOUNTER — Other Ambulatory Visit: Payer: Self-pay

## 2019-06-03 ENCOUNTER — Inpatient Hospital Stay
Admit: 2019-06-03 | Discharge: 2019-06-03 | Disposition: A | Discharge status: Home / Self Care | Payer: Medicaid Other | Attending: Cardiology | Admitting: Cardiology

## 2019-06-03 HISTORY — PX: TEE WITHOUT CARDIOVERSION: SHX5443

## 2019-06-03 LAB — URINE DRUG SCREEN, QUALITATIVE (ARMC ONLY)
Amphetamines, Ur Screen: NOT DETECTED
Barbiturates, Ur Screen: NOT DETECTED
Benzodiazepine, Ur Scrn: NOT DETECTED
Cannabinoid 50 Ng, Ur ~~LOC~~: NOT DETECTED
Cocaine Metabolite,Ur ~~LOC~~: NOT DETECTED
MDMA (Ecstasy)Ur Screen: NOT DETECTED
Methadone Scn, Ur: NOT DETECTED
Opiate, Ur Screen: NOT DETECTED
Phencyclidine (PCP) Ur S: NOT DETECTED
Tricyclic, Ur Screen: NOT DETECTED

## 2019-06-03 LAB — GLUCOSE, CAPILLARY
Glucose-Capillary: 241 mg/dL — ABNORMAL HIGH (ref 70–99)
Glucose-Capillary: 179 mg/dL — ABNORMAL HIGH (ref 70–99)
Glucose-Capillary: 99 mg/dL (ref 70–99)
Glucose-Capillary: 223 mg/dL — ABNORMAL HIGH (ref 70–99)
Glucose-Capillary: 407 mg/dL — ABNORMAL HIGH (ref 70–99)
Glucose-Capillary: 478 mg/dL — ABNORMAL HIGH (ref 70–99)

## 2019-06-03 LAB — MPO/PR-3 (ANCA) ANTIBODIES
ANCA Proteinase 3: <3.5 U/mL (ref 0.0–3.5)
Myeloperoxidase Abs: <9 U/mL (ref 0.0–9.0)

## 2019-06-03 LAB — BASIC METABOLIC PANEL
Anion gap: 7 (ref 5–15)
BUN: 25 mg/dL — ABNORMAL HIGH (ref 6–20)
CO2: 21 mmol/L — ABNORMAL LOW (ref 22–32)
Calcium: 8.1 mg/dL — ABNORMAL LOW (ref 8.9–10.3)
Chloride: 112 mmol/L — ABNORMAL HIGH (ref 98–111)
Creatinine, Ser: 1.81 mg/dL — ABNORMAL HIGH (ref 0.61–1.24)
GFR calc Af Amer: 46 mL/min — ABNORMAL LOW (ref >60)
GFR calc non Af Amer: 40 mL/min — ABNORMAL LOW (ref >60)
Glucose, Bld: 292 mg/dL — ABNORMAL HIGH (ref 70–99)
Potassium: 4.1 mmol/L (ref 3.5–5.1)
Sodium: 140 mmol/L (ref 135–145)

## 2019-06-03 LAB — HEMOGLOBIN AND HEMATOCRIT, BLOOD
HCT: 24.9 % — ABNORMAL LOW (ref 39.0–52.0)
HCT: 24.5 % — ABNORMAL LOW (ref 39.0–52.0)
HCT: 24.3 % — ABNORMAL LOW (ref 39.0–52.0)
Hemoglobin: 8.1 g/dL — ABNORMAL LOW (ref 13.0–17.0)
Hemoglobin: 8.1 g/dL — ABNORMAL LOW (ref 13.0–17.0)
Hemoglobin: 8.1 g/dL — ABNORMAL LOW (ref 13.0–17.0)

## 2019-06-03 LAB — MAGNESIUM: Magnesium: 2.3 mg/dL (ref 1.7–2.4)

## 2019-06-03 SURGERY — ECHOCARDIOGRAM, TRANSESOPHAGEAL
Anesthesia: General

## 2019-06-03 MED ORDER — MIDAZOLAM HCL 2 MG/2ML IJ SOLN
INTRAMUSCULAR | Status: AC
  Filled 2019-06-03: qty 2

## 2019-06-03 MED ORDER — PROPOFOL 10 MG/ML IV BOLUS
INTRAVENOUS | Status: AC
  Filled 2019-06-03: qty 20

## 2019-06-03 MED ORDER — BUTAMBEN-TETRACAINE-BENZOCAINE 2-2-14 % EX AERO
INHALATION_SPRAY | CUTANEOUS | Status: AC
  Filled 2019-06-03: qty 5

## 2019-06-03 MED ORDER — LIDOCAINE VISCOUS HCL 2 % MT SOLN
OROMUCOSAL | Status: AC
  Filled 2019-06-03: qty 15

## 2019-06-03 MED ORDER — NEPRO/CARBSTEADY PO LIQD: 237.0000 mL | Freq: Two times a day (BID) | ORAL | Status: DC

## 2019-06-03 MED ORDER — SODIUM CHLORIDE FLUSH 0.9 % IV SOLN
INTRAVENOUS | Status: AC
  Filled 2019-06-03: qty 10

## 2019-06-03 MED ORDER — PROPOFOL 10 MG/ML IV BOLUS
INTRAVENOUS | Status: DC | PRN
  Administered 2019-06-03 (×3): 50 mg via INTRAVENOUS

## 2019-06-03 MED ORDER — VITAMIN C 500 MG PO TABS
250.0000 mg | ORAL_TABLET | Freq: Two times a day (BID) | ORAL | Status: DC
  Administered 2019-06-03 – 2019-06-04 (×2): 250 mg via ORAL
  Filled 2019-06-03 (×2): qty 1

## 2019-06-03 MED ORDER — INSULIN ASPART 100 UNIT/ML ~~LOC~~ SOLN
12.0000 [IU] | Freq: Once | SUBCUTANEOUS | Status: AC
  Administered 2019-06-03: 12 [IU] via SUBCUTANEOUS
  Filled 2019-06-03: qty 1

## 2019-06-03 MED ORDER — MIDAZOLAM HCL 2 MG/2ML IJ SOLN
INTRAMUSCULAR | Status: DC | PRN
  Administered 2019-06-03: 2 mg via INTRAVENOUS

## 2019-06-03 NOTE — Progress Notes (Signed)
GI note  Chart reviewed. TEE negative. Plan for EGD tomorrow. Per RN no significant rebleeding.   VSS  Further recommendations after EGD.    T. Keith Shaquisha Wynn, MD [facility]  [facility]

## 2019-06-03 NOTE — Anesthesia Postprocedure Evaluation (Signed)
Anesthesia Post Note  Patient: Erwin Nishiyama  Procedure(s) Performed: TRANSESOPHAGEAL ECHOCARDIOGRAM (TEE) (N/A )  Patient location during evaluation: Other (specials recovery) Anesthesia Type: General Level of consciousness: awake and alert and oriented Pain management: pain level controlled Vital Signs Assessment: post-procedure vital signs reviewed and stable Respiratory status: spontaneous breathing, nonlabored ventilation and respiratory function stable Cardiovascular status: blood pressure returned to baseline and stable Postop Assessment: no signs of nausea or vomiting Anesthetic complications: no     Last Vitals:  Vitals:   06/03/19 1345 06/03/19 1355  BP: 109/70 114/73  Pulse: 67 69  Resp: (!) 23 (!) 22  Temp:    SpO2: 99% 99%    Last Pain:  Vitals:   06/03/19 1355  TempSrc:   PainSc: 0-No pain                 Elienai Gailey

## 2019-06-03 NOTE — Anesthesia Preprocedure Evaluation (Signed)
Anesthesia Evaluation  Patient identified by MRN, date of birth, ID band Patient awake    Reviewed: Allergy & Precautions, NPO status , Patient's Chart, lab work & pertinent test results  History of Anesthesia Complications Negative for: history of anesthetic complications  Airway Mallampati: II  TM Distance: >3 FB Neck ROM: Full    Dental  (+) Poor Dentition, Missing   Pulmonary neg pulmonary ROS, neg sleep apnea, neg COPD,    breath sounds clear to auscultation- rhonchi (-) wheezing      Cardiovascular (-) hypertension(-) CAD, (-) Past MI, (-) Cardiac Stents and (-) CABG  Rhythm:Regular Rate:Normal - Systolic murmurs and - Diastolic murmurs    Neuro/Psych neg Seizures negative neurological ROS  negative psych ROS   GI/Hepatic negative GI ROS, Neg liver ROS,   Endo/Other  diabetes, Insulin Dependent  Renal/GU negative Renal ROS     Musculoskeletal negative musculoskeletal ROS (+)   Abdominal (+) - obese,   Peds  Hematology negative hematology ROS (+)   Anesthesia Other Findings    Reproductive/Obstetrics                             Anesthesia Physical Anesthesia Plan  ASA: II  Anesthesia Plan: General   Post-op Pain Management:    Induction: Intravenous  PONV Risk Score and Plan: 1 and Propofol infusion  Airway Management Planned: Natural Airway  Additional Equipment:   Intra-op Plan:   Post-operative Plan:   Informed Consent: I have reviewed the patients History and Physical, chart, labs and discussed the procedure including the risks, benefits and alternatives for the proposed anesthesia with the patient or authorized representative who has indicated his/her understanding and acceptance.     Dental advisory given  Plan Discussed with: CRNA and Anesthesiologist  Anesthesia Plan Comments:         Anesthesia Quick Evaluation

## 2019-06-03 NOTE — Progress Notes (Signed)
Patient ID: Spencer [name] Mcdonnell, male   DOB: 11/13/1960, 58 y.o.   MRN: [id number]  [facility] PROGRESS NOTE  Spencer [name] Eslinger JXB:[id number] DOB: 02/19/1961 DOA: 05/28/2019 PCP: Sherron Mondayejan-Sie, S Ahmed, MD  HPI/Subjective: Patient had his TEE which was negative.  He is very hungry    Objective: Vitals:   06/03/19 1345 06/03/19 1355  BP: 109/70 114/73  Pulse: 67 69  Resp: (!) 23 (!) 22  Temp:    SpO2: 99% 99%    Intake/Output Summary (Last 24 hours) at 06/03/2019 1508 Last data filed at 06/03/2019 1318 Gross per 24 hour  Intake 1993.59 ml  Output 2725 ml  Net -731.41 ml   Filed Weights   06/03/19 0500 06/03/19 0702 06/03/19 1238  Weight: 70.3 kg 69 kg 69 kg    ROS: Review of Systems  Constitutional: Negative for chills, fever, malaise/fatigue and weight loss.  HENT: Negative for ear discharge, ear pain, hearing loss, nosebleeds and tinnitus.   Eyes: Negative for blurred vision, double vision, photophobia, pain and discharge.  Respiratory: Negative for cough, hemoptysis, sputum production and shortness of breath.   Cardiovascular: Negative for chest pain, palpitations, orthopnea and leg swelling.  Gastrointestinal: Negative for abdominal pain, blood in stool, constipation, diarrhea, heartburn, melena, nausea and vomiting.  Genitourinary: Negative for dysuria, frequency, hematuria and urgency.  Musculoskeletal: Negative for back pain, falls, joint pain, myalgias and neck pain.  Skin: Negative for itching.  Neurological: Negative for dizziness, tingling, tremors, sensory change and headaches.  Endo/Heme/Allergies: Negative for environmental allergies and polydipsia. Does not bruise/bleed easily.  Psychiatric/Behavioral: Negative for depression and suicidal ideas.   Exam: Physical Exam  HENT:  Nose: No mucosal edema.  Eyes: Pupils are equal, round, and reactive to light. Lids are normal.  Neck: Carotid bruit is not present. No thyromegaly present.  Cardiovascular: Regular rhythm, S1  normal, S2 normal and normal heart sounds.  Pulses:      Dorsalis pedis pulses are 1+ on the right side and 1+ on the left side.  Respiratory: He has no decreased breath sounds. He has no wheezes. He has no rhonchi. He has no rales.  GI: Soft. Bowel sounds are normal. There is no abdominal tenderness.  Musculoskeletal:     Right ankle: He exhibits no swelling.     Left ankle: He exhibits no swelling.  Skin: Skin is warm. No rash noted. Nails show no clubbing.      Data Reviewed: Basic Metabolic Panel: Recent Labs  Lab 05/29/19 0526  05/30/19 0446 05/31/19 0037 06/01/19 0514 06/02/19 0228 06/03/19 0219  NA 149*   < > 151* 152* 145 145 140  K 3.4*   < > 3.8 3.4* 3.4* 3.6 4.1  CL 121*   < > 128* 126* 116* 119* 112*  CO2 11*   < > 17* 19* 22 22 21*  GLUCOSE 472*   < > 151* 190* 231* 201* 292*  BUN 81*   < > 82* 63* 39* 35* 25*  CREATININE 3.68*   < > 3.12* 2.89* 2.18* 2.14* 1.81*  CALCIUM 7.5*   < > 8.5* 8.9 8.1* 8.1* 8.1*  MG 2.1  --  1.8 2.2 1.8 1.8 2.3  PHOS <1.0*  --  1.6* 1.8* 3.4 3.3  --    < > = values in this interval not displayed.   Liver Function Tests: Recent Labs  Lab 05/28/19 1638  AST 32  ALT 21  ALKPHOS 143*  BILITOT 1.9*  PROT 5.7*  ALBUMIN 3.6   CBC:  Recent Labs  Lab 05/28/19 1638 05/29/19 0526 05/29/19 1344 05/30/19 0446 05/31/19 0554  06/02/19 0228 06/02/19 1028 06/02/19 1930 06/03/19 0219 06/03/19 1033  WBC 24.6* 7.5 8.1 10.0 11.5*  --  7.8  --   --   --   --   NEUTROABS 20.7*  --  7.3  --   --   --   --   --   --   --   --   HGB 13.5 10.6* 11.4* 11.7* 10.0*   < > 8.3* 8.7* 8.6* 8.1* 8.1*  HCT 45.1 29.9* 31.2* 32.5* 28.1*   < > 24.6* 25.7* 24.9* 24.9* 24.5*  MCV 108.4* 90.9 87.9 87.8 88.6  --  93.5  --   --   --   --   PLT 208 80* 74* 71* 62*  --  77*  --   --   --   --    < > = values in this interval not displayed.    CBG: Recent Labs  Lab 06/02/19 2022 06/02/19 2339 06/03/19 0413 06/03/19 0752 06/03/19 1144  GLUCAP 346*  272* 241* 179* 99    Recent Results (from the past 240 hour(s))  Blood Culture (routine x 2)     Status: Abnormal (Preliminary result)   Collection Time: 05/28/19  4:38 PM   Specimen: BLOOD  Result Value Ref Range Status   Specimen Description   Final    BLOOD BLOOD LEFT ARM Performed at [facility], [address], [place], [place] [address]    Special Requests   Final    BOTTLES DRAWN AEROBIC AND ANAEROBIC Blood Culture adequate volume Performed at [facility], [address] [place], [place] [address]    Culture  Setup Time   Final    YEAST AEROBIC BOTTLE ONLY CRITICAL RESULT CALLED TO, READ BACK BY AND VERIFIED WITH: JASON ROBBINS ON 05/29/2019 AT 2057 QSD Performed at [facility], [address] [place], [place] [address]    Culture (A)  Final    CANDIDA ALBICANS Sent to [facility] for further susceptibility testing. Performed at [facility], [address] [place], [place] [address]    Report Status PENDING  Incomplete  Blood Culture (routine x 2)     Status: Abnormal   Collection Time: 05/28/19  4:38 PM   Specimen: BLOOD  Result Value Ref Range Status   Specimen Description   Final    BLOOD BLOOD RIGHT ARM Performed at [facility], [address], [place], [place] [address]    Special Requests   Final    BOTTLES DRAWN AEROBIC AND ANAEROBIC Blood Culture adequate volume Performed at [facility], [address] [place], [place] [address]    Culture  Setup Time   Final    YEAST IN BOTH AEROBIC AND ANAEROBIC BOTTLES CRITICAL RESULT CALLED TO, READ BACK BY AND VERIFIED WITH: JASON ROBBINS ON 05/29/2019 AT 2057 QSD Performed at [facility], [address], [place], [place] [address]    Culture CANDIDA ALBICANS (A)  Final   Report Status 06/01/2019 FINAL  Final  Urine culture     Status: None   Collection Time: 05/28/19  4:38 PM   Specimen: In/Out Cath Urine  Result Value Ref Range Status    Specimen Description   Final    IN/OUT CATH URINE Performed at [facility], [address] [place], [place] [address]    Special Requests   Final  NONE Performed at [facility], [address] [place], [place] [address]    Culture   Final    NO GROWTH Performed at [facility], [address] [place], [place] [address]    Report Status 05/29/2019 FINAL  Final  SARS Coronavirus 2 (CEPHEID- Performed in [facility] hospital lab), Hosp Order     Status: None   Collection Time: 05/28/19  4:38 PM   Specimen: Nasopharyngeal Swab  Result Value Ref Range Status   SARS Coronavirus 2 NEGATIVE NEGATIVE Final    Comment: (NOTE) If result is NEGATIVE SARS-CoV-2 target nucleic acids are NOT DETECTED. The SARS-CoV-2 RNA is generally detectable in upper and lower  respiratory specimens during the acute phase of infection. The lowest  concentration of SARS-CoV-2 viral copies this assay can detect is 250  copies / mL. A negative result does not preclude SARS-CoV-2 infection  and should not be used as the sole basis for treatment or other  patient management decisions.  A negative result may occur with  improper specimen collection / handling, submission of specimen other  than nasopharyngeal swab, presence of viral mutation(s) within the  areas targeted by this assay, and inadequate number of viral copies  (<250 copies / mL). A negative result must be combined with clinical  observations, patient history, and epidemiological information. If result is POSITIVE SARS-CoV-2 target nucleic acids are DETECTED. The SARS-CoV-2 RNA is generally detectable in upper and lower  respiratory specimens dur ing the acute phase of infection.  Positive  results are indicative of active infection with SARS-CoV-2.  Clinical  correlation with patient history and other diagnostic information is  necessary to determine patient infection status.  Positive results do  not rule out  bacterial infection or co-infection with other viruses. If result is PRESUMPTIVE POSTIVE SARS-CoV-2 nucleic acids MAY BE PRESENT.   A presumptive positive result was obtained on the submitted specimen  and confirmed on repeat testing.  While 2019 novel coronavirus  (SARS-CoV-2) nucleic acids may be present in the submitted sample  additional confirmatory testing may be necessary for epidemiological  and / or clinical management purposes  to differentiate between  SARS-CoV-2 and other Sarbecovirus currently known to infect humans.  If clinically indicated additional testing with an alternate test  methodology [phone]([id number]) is advised. The SARS-CoV-2 RNA is generally  detectable in upper and lower respiratory sp ecimens during the acute  phase of infection. The expected result is Negative. Fact Sheet for Patients:  BoilerBrush.com.cyhttps://www.fda.gov/media/136312/download Fact Sheet for Healthcare Providers: https://pope.com/https://www.fda.gov/media/136313/download This test is not yet approved or cleared by the [place] FDA and has been authorized for detection and/or diagnosis of SARS-CoV-2 by FDA under an Emergency Use Authorization (EUA).  This EUA will remain in effect (meaning this test can be used) for the duration of the COVID-19 declaration under Section 564(b)(1) of the Act, 21 U.S.C. section 360bbb-3(b)(1), unless the authorization is terminated or revoked sooner. Performed at [facility], [address] [place], [place] [address]   Blood Culture ID Panel (Reflexed)     Status: Abnormal   Collection Time: 05/28/19  4:38 PM  Result Value Ref Range Status   Enterococcus species NOT DETECTED NOT DETECTED Final   Listeria monocytogenes NOT DETECTED NOT DETECTED Final   Staphylococcus species NOT DETECTED NOT DETECTED Final   Staphylococcus aureus (BCID) NOT DETECTED NOT DETECTED Final   Streptococcus species NOT DETECTED NOT DETECTED Final   Streptococcus agalactiae NOT DETECTED NOT DETECTED  Final  Streptococcus pneumoniae NOT DETECTED NOT DETECTED Final   Streptococcus pyogenes NOT DETECTED NOT DETECTED Final   Acinetobacter baumannii NOT DETECTED NOT DETECTED Final   Enterobacteriaceae species NOT DETECTED NOT DETECTED Final   Enterobacter cloacae complex NOT DETECTED NOT DETECTED Final   Escherichia coli NOT DETECTED NOT DETECTED Final   Klebsiella oxytoca NOT DETECTED NOT DETECTED Final   Klebsiella pneumoniae NOT DETECTED NOT DETECTED Final   Proteus species NOT DETECTED NOT DETECTED Final   Serratia marcescens NOT DETECTED NOT DETECTED Final   Haemophilus influenzae NOT DETECTED NOT DETECTED Final   Neisseria meningitidis NOT DETECTED NOT DETECTED Final   Pseudomonas aeruginosa NOT DETECTED NOT DETECTED Final   Candida albicans DETECTED (A) NOT DETECTED Final    Comment: CRITICAL RESULT CALLED TO, READ BACK BY AND VERIFIED WITH: JASON ROBBINS ON 05/29/2019 AT 2057 QSD    Candida glabrata NOT DETECTED NOT DETECTED Final   Candida krusei NOT DETECTED NOT DETECTED Final   Candida parapsilosis NOT DETECTED NOT DETECTED Final   Candida tropicalis NOT DETECTED NOT DETECTED Final    Comment: Performed at [facility], [address] [place], [place] [address]  MRSA PCR Screening     Status: None   Collection Time: 05/28/19  9:11 PM   Specimen: Nasopharyngeal  Result Value Ref Range Status   MRSA by PCR NEGATIVE NEGATIVE Final    Comment:        The GeneXpert MRSA Assay (FDA approved for NASAL specimens only), is one component of a comprehensive MRSA colonization surveillance program. It is not intended to diagnose MRSA infection nor to guide or monitor treatment for MRSA infections. Performed at [facility], [address] [place], [place] [address]   CULTURE, BLOOD (ROUTINE X 2) w Reflex to ID Panel     Status: None (Preliminary result)   Collection Time: 05/31/19 12:24 AM   Specimen: BLOOD  Result Value Ref Range Status   Specimen  Description BLOOD BLOOD RIGHT FOREARM  Final   Special Requests   Final    BOTTLES DRAWN AEROBIC AND ANAEROBIC Blood Culture results may not be optimal due to an excessive volume of blood received in culture bottles   Culture   Final    NO GROWTH 3 DAYS Performed at [facility], [address] [place], [place] [address]    Report Status PENDING  Incomplete  CULTURE, BLOOD (ROUTINE X 2) w Reflex to ID Panel     Status: None (Preliminary result)   Collection Time: 05/31/19 12:35 AM   Specimen: BLOOD  Result Value Ref Range Status   Specimen Description BLOOD BLOOD RIGHT HAND  Final   Special Requests   Final    BOTTLES DRAWN AEROBIC AND ANAEROBIC Blood Culture adequate volume   Culture   Final    NO GROWTH 3 DAYS Performed at [facility], [address] [place], [place] [address]    Report Status PENDING  Incomplete     Studies: No results found.  Scheduled Meds: . Chlorhexidine Gluconate Cloth  6 each Topical Daily  . feeding supplement (NEPRO CARB STEADY)  237 mL Oral BID BM  . folic acid  1 mg Oral Daily  . insulin aspart  0-5 Units Subcutaneous QHS  . insulin aspart  0-9 Units Subcutaneous TID WC  . insulin aspart  6 Units Subcutaneous TID WC  . insulin glargine  24 Units Subcutaneous QHS  . multivitamin with minerals  1 tablet Oral Daily  . pantoprazole  40 mg Oral Daily  . thiamine  100 mg Oral Daily  . vitamin C  250 mg Oral BID   Continuous Infusions: . sodium chloride 20 mL/hr at 06/03/19 1248  . sodium chloride    . fluconazole (DIFLUCAN) IV Stopped (06/02/19 2000)    Assessment/Plan:  1. Sepsis with Candida.  Continue Diflucan.  Echocardiogram negative appreciate ID input, TEE is done and is negative  2. acute metabolic encephalopathy.  Resolved  3. diabetic ketoacidosis.  Patient switched off insulin drip onto Lantus insulin and sliding scale.  Hemoglobin A1c elevated at 13.8. 4. Dark tarry stools continue PPIs plan for endoscopy  tomorrow 5. Drop in platelets.  Platelets improving 6. Acute kidney injury likely from volume depletion and diabetic ketoacidosis.   7. Hypokalemia and hypophosphatemia.  Pharmacist electrolyte protocol 8.  Hypernatremia.  Sodium is now normal     code Status:         Code Status Orders  (From admission, onward)         Start     Ordered   05/28/19 1838  Full code  Continuous     05/28/19 1845        Code Status History    This patient has a current code status but no historical code status.   Advance Care Planning Activity      Disposition Plan: To be determined  Consultants:  Critical care specialist  Nephrology  Antibiotics:  Diflucan  Time spent: 27 minutes, case discussed with critical care team  [place]

## 2019-06-03 NOTE — CV Procedure (Signed)
   TRANSESOPHAGEAL ECHOCARDIOGRAM   NAME:  Spencer [name]   MRN: [id number] DOB:  Dec 20, 1960   ADMIT DATE: 05/28/2019  INDICATIONS:   PROCEDURE:   Informed consent was obtained prior to the procedure. The risks, benefits and alternatives for the procedure were discussed and the patient comprehended these risks.  Risks include, but are not limited to, cough, sore throat, vomiting, nausea, somnolence, esophageal and stomach trauma or perforation, bleeding, low blood pressure, aspiration, pneumonia, infection, trauma to the teeth and death.    After a procedural time-out, the patient was given propofol per dept of anesthesia. The oropharynx was anesthetized 3 cc of topical 1% viscous lidocaine.  The transesophageal probe was inserted in the esophagus and stomach without difficulty and multiple views were obtained. I was present for the entire procedure.    COMPLICATIONS:    There were no immediate complications.  FINDINGS:  LEFT VENTRICLE: EF = 55%. No regional wall motion abnormalities.  RIGHT VENTRICLE: Normal size and function.   LEFT ATRIUM: Normal size.   LEFT ATRIAL APPENDAGE: No thrombus.   RIGHT ATRIUM: Normal.   AORTIC VALVE:  Trileaflet. Trivial ai. No as. No vegetations  MITRAL VALVE:    Normal. Mild mr. No vegetations  TRICUSPID VALVE: Normal. Trivial tr. No vegetations  PULMONIC VALVE: Grossly normal.  INTERATRIAL SEPTUM: No PFO or ASD. Agitated saline contrast was used.   PERICARDIUM: No effusion  DESCENDING AORTA: Normal   CONCLUSION: Normal tee.  No vegetations

## 2019-06-03 NOTE — Anesthesia Post-op Follow-up Note (Signed)
Anesthesia QCDR form completed.        

## 2019-06-03 NOTE — Progress Notes (Signed)
*  PRELIMINARY RESULTS* Echocardiogram Echocardiogram Transesophageal has been performed.  Sherrie Sport 06/03/2019, 1:24 PM

## 2019-06-03 NOTE — Progress Notes (Signed)
Nutrition Follow Up Note   DOCUMENTATION CODES:   Underweight  INTERVENTION:   Nepro Shake po BID, each supplement provides 425 kcal and 19 grams protein  MVI daily   Vitamin C 250mg  po BID  Pt at high refeed risk; recommend monitor K, Mg and P labs daily   NUTRITION DIAGNOSIS:   Inadequate oral intake related to acute illness as evidenced by NPO status.  GOAL:   Patient will meet greater than or equal to 90% of their needs  -Progressing   MONITOR:   PO intake, Supplement acceptance, Labs, Weight trends, Skin, I & O's  ASSESSMENT:   58 yo male with a history of cocaine abuse admitted with acute encephalopathy secondary to Diabetic Ketoacidosis  Pt with improved appetite and oral intake; pt eating 100% of meals. RD will add supplements to help pt meet his estimated needs. Pt NPO today for TEE. Recommend continue MVI. RD will add vitamin C in setting of GIB. Per chart, pt up ~9lbs since admit; RD will continue to monitor. Refeed labs stable.   Medications reviewed and include: folic acid, insulin, MVI, protonix, thiamine, NaCl @20ml /hr, diflucan   Labs reviewed: BUN 25(H), creat 1.81(H), P 3.3 wnl, Mg 2.3 wnl Hgb 8.1(L), Hct 24.5(L) cbgs- 241, 179, 99 x 24 hrs  Diet Order:   Diet Order            Diet NPO time specified Except for: Sips with Meds  Diet effective midnight        Diet NPO time specified  Diet effective midnight             EDUCATION NEEDS:   Not appropriate for education at this time  Skin:  Skin Assessment: Reviewed RN Assessment  Last BM:  7/26  Height:   Ht Readings from Last 1 Encounters:  06/03/19 6\' 3"  (1.905 m)    Weight:   Wt Readings from Last 1 Encounters:  06/03/19 69 kg    Ideal Body Weight:  89 kg  BMI:  Body mass index is 19.01 kg/m.  Estimated Nutritional Needs:   Kcal:  2000-2300kcal/day  Protein:  100-115g/day  Fluid:  >2L/day  Koleen Distance MS, RD, LDN Pager #- [phone] Office#-  [phone] After Hours Pager: [phone]

## 2019-06-03 NOTE — Progress Notes (Signed)
[facility]  ROUNDING NOTE   Subjective:   TEE later today.   UOP 2675mL.   Creatinine 1.81 (2.14)  Objective:  Vital signs in last 24 hours:  Temp:  [98.3 F (36.8 C)-98.5 F (36.9 C)] 98.5 F (36.9 C) (07/28 0824) Pulse Rate:  [64-71] 64 (07/28 0824) Resp:  [4-23] 16 (07/28 0824) BP: (113-122)/(77-88) 122/81 (07/28 0824) SpO2:  [96 %-100 %] 100 % (07/28 0824) Weight:  [69 kg-70.3 kg] 69 kg (07/28 0702)  Weight change: 2 kg Filed Weights   06/02/19 0400 06/03/19 0500 06/03/19 0702  Weight: 68.3 kg 70.3 kg 69 kg    Intake/Output: I/O last 3 completed shifts: In: 3013.6 [P.O.:1940; I.V.:763.1; IV Piggyback:310.5] Out: 4400 [Urine:4400]   Intake/Output this shift:  Total I/O In: -  Out: 250 [Urine:250]  Physical Exam: General: NAD, laying in bed  Head: Normocephalic, atraumatic. Moist oral mucosal membranes, poor dentition  Eyes: Anicteric, PERRL  Neck: Supple, trachea midline  Lungs:  Clear to auscultation  Heart: Regular rate and rhythm  Abdomen:  Soft, nontender  Extremities:  no peripheral edema.  Neurologic: Nonfocal, moving all four extremities  Skin: No lesions        Basic Metabolic Panel: Recent Labs  Lab 05/29/19 0526  05/30/19 0446 05/31/19 0037 06/01/19 0514 06/02/19 0228 06/03/19 0219  NA 149*   < > 151* 152* 145 145 140  K 3.4*   < > 3.8 3.4* 3.4* 3.6 4.1  CL 121*   < > 128* 126* 116* 119* 112*  CO2 11*   < > 17* 19* 22 22 21*  GLUCOSE 472*   < > 151* 190* 231* 201* 292*  BUN 81*   < > 82* 63* 39* 35* 25*  CREATININE 3.68*   < > 3.12* 2.89* 2.18* 2.14* 1.81*  CALCIUM 7.5*   < > 8.5* 8.9 8.1* 8.1* 8.1*  MG 2.1  --  1.8 2.2 1.8 1.8 2.3  PHOS <1.0*  --  1.6* 1.8* 3.4 3.3  --    < > = values in this interval not displayed.    Liver Function Tests: Recent Labs  Lab 05/28/19 1638  AST 32  ALT 21  ALKPHOS 143*  BILITOT 1.9*  PROT 5.7*  ALBUMIN 3.6   No results for input(s): LIPASE, AMYLASE in the last 168  hours. No results for input(s): AMMONIA in the last 168 hours.  CBC: Recent Labs  Lab 05/28/19 1638 05/29/19 0526 05/29/19 1344 05/30/19 0446 05/31/19 0554 06/01/19 1027 06/02/19 0228 06/02/19 1028 06/02/19 1930 06/03/19 0219  WBC 24.6* 7.5 8.1 10.0 11.5*  --  7.8  --   --   --   NEUTROABS 20.7*  --  7.3  --   --   --   --   --   --   --   HGB 13.5 10.6* 11.4* 11.7* 10.0* 10.4* 8.3* 8.7* 8.6* 8.1*  HCT 45.1 29.9* 31.2* 32.5* 28.1* 30.5* 24.6* 25.7* 24.9* 24.9*  MCV 108.4* 90.9 87.9 87.8 88.6  --  93.5  --   --   --   PLT 208 80* 74* 71* 62*  --  77*  --   --   --     Cardiac Enzymes: No results for input(s): CKTOTAL, CKMB, CKMBINDEX, TROPONINI in the last 168 hours.  BNP: Invalid input(s): POCBNP  CBG: Recent Labs  Lab 06/02/19 1632 06/02/19 2022 06/02/19 2339 06/03/19 0413 06/03/19 0752  GLUCAP 220* 346* 272* 241* 179*    Microbiology:  Results for orders placed or performed during the hospital encounter of 05/28/19  Blood Culture (routine x 2)     Status: Abnormal (Preliminary result)   Collection Time: 05/28/19  4:38 PM   Specimen: BLOOD  Result Value Ref Range Status   Specimen Description   Final    BLOOD BLOOD LEFT ARM Performed at [facility], [address] [place], [place] [address]    Special Requests   Final    BOTTLES DRAWN AEROBIC AND ANAEROBIC Blood Culture adequate volume Performed at [facility], [address] [place], [place] [address]    Culture  Setup Time   Final    YEAST AEROBIC BOTTLE ONLY CRITICAL RESULT CALLED TO, READ BACK BY AND VERIFIED WITH: JASON ROBBINS ON 05/29/2019 AT 2057 QSD Performed at [facility], [address] [place], [place] [address]    Culture CANDIDA ALBICANS (A)  Final   Report Status PENDING  Incomplete  Blood Culture (routine x 2)     Status: Abnormal   Collection Time: 05/28/19  4:38 PM   Specimen: BLOOD  Result Value Ref Range Status   Specimen Description   Final     BLOOD BLOOD RIGHT ARM Performed at [facility], [address] [place], [place] [address]    Special Requests   Final    BOTTLES DRAWN AEROBIC AND ANAEROBIC Blood Culture adequate volume Performed at [facility], [address] [place], [place] [address]    Culture  Setup Time   Final    YEAST IN BOTH AEROBIC AND ANAEROBIC BOTTLES CRITICAL RESULT CALLED TO, READ BACK BY AND VERIFIED WITH: JASON ROBBINS ON 05/29/2019 AT 2057 QSD Performed at [facility], [address] [place], [place] [address]    Culture CANDIDA ALBICANS (A)  Final   Report Status 06/01/2019 FINAL  Final  Urine culture     Status: None   Collection Time: 05/28/19  4:38 PM   Specimen: In/Out Cath Urine  Result Value Ref Range Status   Specimen Description   Final    IN/OUT CATH URINE Performed at [facility], [address] [place], [place] [address]    Special Requests   Final    NONE Performed at [facility], [address] [place], [place] [address]    Culture   Final    NO GROWTH Performed at [facility], [address] [place], [place] [address]    Report Status 05/29/2019 FINAL  Final  SARS Coronavirus 2 (CEPHEID- Performed in [facility] lab), Hosp Order     Status: None   Collection Time: 05/28/19  4:38 PM   Specimen: Nasopharyngeal Swab  Result Value Ref Range Status   SARS Coronavirus 2 NEGATIVE NEGATIVE Final    Comment: (NOTE) If result is NEGATIVE SARS-CoV-2 target nucleic acids are NOT DETECTED. The SARS-CoV-2 RNA is generally detectable in upper and lower  respiratory specimens during the acute phase of infection. The lowest  concentration of SARS-CoV-2 viral copies this assay can detect is 250  copies / mL. A negative result does not preclude SARS-CoV-2 infection  and should not be used as the sole basis for treatment or other  patient management decisions.  A negative result may occur with  improper specimen  collection / handling, submission of specimen other  than nasopharyngeal swab, presence of viral mutation(s) within the  areas targeted by this assay, and inadequate number of viral copies  (<250 copies /  mL). A negative result must be combined with clinical  observations, patient history, and epidemiological information. If result is POSITIVE SARS-CoV-2 target nucleic acids are DETECTED. The SARS-CoV-2 RNA is generally detectable in upper and lower  respiratory specimens dur ing the acute phase of infection.  Positive  results are indicative of active infection with SARS-CoV-2.  Clinical  correlation with patient history and other diagnostic information is  necessary to determine patient infection status.  Positive results do  not rule out bacterial infection or co-infection with other viruses. If result is PRESUMPTIVE POSTIVE SARS-CoV-2 nucleic acids MAY BE PRESENT.   A presumptive positive result was obtained on the submitted specimen  and confirmed on repeat testing.  While 2019 novel coronavirus  (SARS-CoV-2) nucleic acids may be present in the submitted sample  additional confirmatory testing may be necessary for epidemiological  and / or clinical management purposes  to differentiate between  SARS-CoV-2 and other Sarbecovirus currently known to infect humans.  If clinically indicated additional testing with an alternate test  methodology [phone]) is advised. The SARS-CoV-2 RNA is generally  detectable in upper and lower respiratory sp ecimens during the acute  phase of infection. The expected result is Negative. Fact Sheet for Patients:  StrictlyIdeas.no Fact Sheet for Healthcare Providers: BankingDealers.co.za This test is not yet approved or cleared by the [place] FDA and has been authorized for detection and/or diagnosis of SARS-CoV-2 by FDA under an Emergency Use Authorization (EUA).  This EUA will remain in effect  (meaning this test can be used) for the duration of the COVID-19 declaration under Section 564(b)(1) of the Act, 21 U.S.C. section 360bbb-3(b)(1), unless the authorization is terminated or revoked sooner. Performed at [facility], [address] [place], [place] [address]   Blood Culture ID Panel (Reflexed)     Status: Abnormal   Collection Time: 05/28/19  4:38 PM  Result Value Ref Range Status   Enterococcus species NOT DETECTED NOT DETECTED Final   Listeria monocytogenes NOT DETECTED NOT DETECTED Final   Staphylococcus species NOT DETECTED NOT DETECTED Final   Staphylococcus aureus (BCID) NOT DETECTED NOT DETECTED Final   Streptococcus species NOT DETECTED NOT DETECTED Final   Streptococcus agalactiae NOT DETECTED NOT DETECTED Final   Streptococcus pneumoniae NOT DETECTED NOT DETECTED Final   Streptococcus pyogenes NOT DETECTED NOT DETECTED Final   Acinetobacter baumannii NOT DETECTED NOT DETECTED Final   Enterobacteriaceae species NOT DETECTED NOT DETECTED Final   Enterobacter cloacae complex NOT DETECTED NOT DETECTED Final   Escherichia coli NOT DETECTED NOT DETECTED Final   Klebsiella oxytoca NOT DETECTED NOT DETECTED Final   Klebsiella pneumoniae NOT DETECTED NOT DETECTED Final   Proteus species NOT DETECTED NOT DETECTED Final   Serratia marcescens NOT DETECTED NOT DETECTED Final   Haemophilus influenzae NOT DETECTED NOT DETECTED Final   Neisseria meningitidis NOT DETECTED NOT DETECTED Final   Pseudomonas aeruginosa NOT DETECTED NOT DETECTED Final   Candida albicans DETECTED (A) NOT DETECTED Final    Comment: CRITICAL RESULT CALLED TO, READ BACK BY AND VERIFIED WITH: JASON ROBBINS ON 05/29/2019 AT 2057 QSD    Candida glabrata NOT DETECTED NOT DETECTED Final   Candida krusei NOT DETECTED NOT DETECTED Final   Candida parapsilosis NOT DETECTED NOT DETECTED Final   Candida tropicalis NOT DETECTED NOT DETECTED Final    Comment: Performed at [facility], [address], [place], [place] [address]  MRSA PCR Screening     Status: None   Collection Time: 05/28/19  9:11 PM   Specimen: Nasopharyngeal  Result Value Ref Range Status   MRSA by PCR NEGATIVE NEGATIVE Final    Comment:        The GeneXpert MRSA Assay (FDA approved for NASAL specimens only), is one component of a comprehensive MRSA colonization surveillance program. It is not intended to diagnose MRSA infection nor to guide or monitor treatment for MRSA infections. Performed at [facility], [address] [place], [place] [address]   CULTURE, BLOOD (ROUTINE X 2) w Reflex to ID Panel     Status: None (Preliminary result)   Collection Time: 05/31/19 12:24 AM   Specimen: BLOOD  Result Value Ref Range Status   Specimen Description BLOOD BLOOD RIGHT FOREARM  Final   Special Requests   Final    BOTTLES DRAWN AEROBIC AND ANAEROBIC Blood Culture results may not be optimal due to an excessive volume of blood received in culture bottles   Culture   Final    NO GROWTH 3 DAYS Performed at [facility], [address], [place], [place] [address]    Report Status PENDING  Incomplete  CULTURE, BLOOD (ROUTINE X 2) w Reflex to ID Panel     Status: None (Preliminary result)   Collection Time: 05/31/19 12:35 AM   Specimen: BLOOD  Result Value Ref Range Status   Specimen Description BLOOD BLOOD RIGHT HAND  Final   Special Requests   Final    BOTTLES DRAWN AEROBIC AND ANAEROBIC Blood Culture adequate volume   Culture   Final    NO GROWTH 3 DAYS Performed at [facility], [address] [place], [place] [address]    Report Status PENDING  Incomplete    Coagulation Studies: No results for input(s): LABPROT, INR in the last 72 hours.  Urinalysis: No results for input(s): COLORURINE, LABSPEC, PHURINE, GLUCOSEU, HGBUR, BILIRUBINUR, KETONESUR, PROTEINUR, UROBILINOGEN, NITRITE, LEUKOCYTESUR in the last 72 hours.  Invalid input(s): APPERANCEUR    Imaging: No  results found.   Medications:   . sodium chloride 120 mL/hr at 06/03/19 0300  . sodium chloride    . fluconazole (DIFLUCAN) IV Stopped (06/02/19 2000)   . Chlorhexidine Gluconate Cloth  6 each Topical Daily  . folic acid  1 mg Oral Daily  . insulin aspart  0-5 Units Subcutaneous QHS  . insulin aspart  0-9 Units Subcutaneous TID WC  . insulin aspart  6 Units Subcutaneous TID WC  . insulin glargine  24 Units Subcutaneous QHS  . multivitamin with minerals  1 tablet Oral Daily  . pantoprazole  40 mg Oral Daily  . thiamine  100 mg Oral Daily   acetaminophen **OR** acetaminophen, dextrose, ondansetron **OR** ondansetron (ZOFRAN) IV  Assessment/ Plan:  Mr. Nimesh Riolo is a 58 y.o. white male withdiabetes mellitus type 2, who was admitted to [facility] on7/22/2020for evaluation of sepsis with fungemia,altered mental status. Found to have DKA, GI bleed requiring PRBC transfusion and acute renal failure.   1. Acute renal failure secondary to severe volume depletion and diabetic ketoacidosis.  Unknown baseline creatinine. Get records from PCP.  - Off IV fluids. Nonoliguric urine output. No indication for dialysis.   2. Metabolic acidosis: secondary to diabetic ketoacidosis.   3. 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 Hemoglobin A1c of 13.8% on 7/24.   4. Anemia with renal failure: concern for GI bleed. Hemoglobin dropped to 8.1 - Appreciate GI input. Endoscopy for later today.  5. Sepsis: fungemia: candida in  blood cultures on 7/22. Hemodynamically stable. Off vasopressors.  - fluconazole.    LOS: 6 Samaad Hashem 7/28/202010:01 AM

## 2019-06-03 NOTE — Transfer of Care (Signed)
Immediate Anesthesia Transfer of Care Note  Patient: Spencer [name]  Procedure(s) Performed: TRANSESOPHAGEAL ECHOCARDIOGRAM (TEE) (N/A )  Patient Location: Short Stay  Anesthesia Type:General  Level of Consciousness: awake  Airway & Oxygen Therapy: Patient Spontanous Breathing and Patient connected to nasal cannula oxygen  Post-op Assessment: Report given to RN and Post -op Vital signs reviewed and stable  Post vital signs: Reviewed  Last Vitals:  Vitals Value Taken Time  BP 100/59 06/03/19 1323  Temp    Pulse 65 06/03/19 1323  Resp 20 06/03/19 1323  SpO2 97 % 06/03/19 1323    Last Pain:  Vitals:   06/03/19 1238  TempSrc: Oral  PainSc: 0-No pain         Complications: No apparent anesthesia complications

## 2019-06-03 NOTE — H&P (View-Only) (Signed)
GI note  Chart reviewed. TEE negative. Plan for EGD tomorrow. Per RN no significant rebleeding.   VSS  Further recommendations after EGD.    Robet Leu, MD [place]

## 2019-06-03 NOTE — Progress Notes (Signed)
Pharmacy Electrolyte Monitoring Consult:  Pharmacy consulted to assist in monitoring and replacing electrolytes in this 58 y.o. male admitted on 05/28/2019. Patient with candida albicans fungemia and is to undergo TEE on 7/28. Patient with history of crack cocaine abuse.   Labs:  Sodium (mmol/L)  Date Value  06/03/2019 140   Potassium (mmol/L)  Date Value  06/03/2019 4.1   Magnesium (mg/dL)  Date Value  06/03/2019 2.3   Phosphorus (mg/dL)  Date Value  06/02/2019 3.3   Calcium (mg/dL)  Date Value  06/03/2019 8.1 (L)   Albumin (g/dL)  Date Value  05/28/2019 3.6    Assessment/Plan: No replacement needed at this time.   Goal potassium ~ 4 and goal magnesium ~ 2.   Will obtain electrolytes with am labs. Patient will require IV replacement on 7/28 secondary to scheduled TEE.   Pharmacy will continue to monitor and adjust per consult.   Oswald Hillock 06/03/2019 8:40 AM

## 2019-06-04 ENCOUNTER — Inpatient Hospital Stay: Payer: Medicaid Other | Admitting: Anesthesiology

## 2019-06-04 ENCOUNTER — Encounter
Admission: EM | Disposition: A | Discharge status: Home / Self Care | Payer: Self-pay | Source: Home / Self Care | Attending: Internal Medicine

## 2019-06-04 ENCOUNTER — Other Ambulatory Visit: Payer: Self-pay

## 2019-06-04 ENCOUNTER — Encounter: Payer: Self-pay | Admitting: *Deleted

## 2019-06-04 DIAGNOSIS — K209 Esophagitis, unspecified: Secondary | ICD-10-CM

## 2019-06-04 DIAGNOSIS — D649 Anemia, unspecified: Secondary | ICD-10-CM

## 2019-06-04 HISTORY — PX: ESOPHAGOGASTRODUODENOSCOPY (EGD) WITH PROPOFOL: SHX5813

## 2019-06-04 LAB — BASIC METABOLIC PANEL
Anion gap: 6 (ref 5–15)
BUN: 27 mg/dL — ABNORMAL HIGH (ref 6–20)
CO2: 21 mmol/L — ABNORMAL LOW (ref 22–32)
Calcium: 7.8 mg/dL — ABNORMAL LOW (ref 8.9–10.3)
Chloride: 110 mmol/L (ref 98–111)
Creatinine, Ser: 1.93 mg/dL — ABNORMAL HIGH (ref 0.61–1.24)
GFR calc Af Amer: 43 mL/min — ABNORMAL LOW (ref >60)
GFR calc non Af Amer: 37 mL/min — ABNORMAL LOW (ref >60)
Glucose, Bld: 383 mg/dL — ABNORMAL HIGH (ref 70–99)
Potassium: 4.1 mmol/L (ref 3.5–5.1)
Sodium: 137 mmol/L (ref 135–145)

## 2019-06-04 LAB — GLUCOSE, CAPILLARY
Glucose-Capillary: 83 mg/dL (ref 70–99)
Glucose-Capillary: 263 mg/dL — ABNORMAL HIGH (ref 70–99)
Glucose-Capillary: 237 mg/dL — ABNORMAL HIGH (ref 70–99)
Glucose-Capillary: 150 mg/dL — ABNORMAL HIGH (ref 70–99)
Glucose-Capillary: 95 mg/dL (ref 70–99)
Glucose-Capillary: 100 mg/dL — ABNORMAL HIGH (ref 70–99)

## 2019-06-04 LAB — CBC
HCT: 23.8 % — ABNORMAL LOW (ref 39.0–52.0)
Hemoglobin: 7.9 g/dL — ABNORMAL LOW (ref 13.0–17.0)
MCH: 31.6 pg (ref 26.0–34.0)
MCHC: 33.2 g/dL (ref 30.0–36.0)
MCV: 95.2 fL (ref 80.0–100.0)
Platelets: 224 10*3/uL (ref 150–400)
RBC: 2.5 MIL/uL — ABNORMAL LOW (ref 4.22–5.81)
RDW: 13.5 % (ref 11.5–15.5)
WBC: 6.1 10*3/uL (ref 4.0–10.5)
nRBC: 0 % (ref 0.0–0.2)

## 2019-06-04 LAB — HEMOGLOBIN AND HEMATOCRIT, BLOOD
HCT: 23.9 % — ABNORMAL LOW (ref 39.0–52.0)
Hemoglobin: 8 g/dL — ABNORMAL LOW (ref 13.0–17.0)

## 2019-06-04 SURGERY — ESOPHAGOGASTRODUODENOSCOPY (EGD) WITH PROPOFOL
Anesthesia: General

## 2019-06-04 MED ORDER — PROPOFOL 10 MG/ML IV BOLUS
INTRAVENOUS | Status: DC | PRN
  Administered 2019-06-04: 50 mg via INTRAVENOUS
  Administered 2019-06-04: 20 mg via INTRAVENOUS

## 2019-06-04 MED ORDER — FLUCONAZOLE 200 MG PO TABS: 200.0000 mg | ORAL_TABLET | Freq: Every day | ORAL | 0 refills | Status: AC

## 2019-06-04 MED ORDER — THIAMINE HCL 100 MG PO TABS: 100.0000 mg | ORAL_TABLET | Freq: Every day | ORAL | 0 refills | Status: AC

## 2019-06-04 MED ORDER — INSULIN GLARGINE 100 UNIT/ML ~~LOC~~ SOLN
26.0000 [IU] | Freq: Every day | SUBCUTANEOUS | Status: DC
  Filled 2019-06-04: qty 0.26

## 2019-06-04 MED ORDER — PANTOPRAZOLE SODIUM 40 MG PO TBEC: 40.0000 mg | DELAYED_RELEASE_TABLET | Freq: Every day | ORAL | 0 refills | Status: DC

## 2019-06-04 MED ORDER — FLUCONAZOLE 100 MG PO TABS
200.0000 mg | ORAL_TABLET | Freq: Every day | ORAL | Status: DC
  Administered 2019-06-04: 200 mg via ORAL
  Filled 2019-06-04: qty 2

## 2019-06-04 MED ORDER — FOLIC ACID 1 MG PO TABS: 1.0000 mg | ORAL_TABLET | Freq: Every day | ORAL | 0 refills | Status: AC

## 2019-06-04 NOTE — Progress Notes (Signed)
Went over discharge instructions with the patient including medications and follow-up appointment. Provided patient education through hand out and ask for teach back. Discontinue PIV and Serenity, RN removed telemetry monitor. Wheeled patient down for his ride.

## 2019-06-04 NOTE — Progress Notes (Signed)
ID  Doing well no complaints waiting to go home Had TEE yesterday with no vegetation. Had endoscopy today.  BP 135/77 (BP Location: Right Arm)   Pulse 70   Temp 99.2 F (37.3 C) (Oral)   Resp 18   Ht 6\' 3"  (1.905 m)   Wt 69.2 kg   SpO2 97%   BMI 19.06 kg/m    CBC Latest Ref Rng & Units 06/04/2019 06/03/2019 06/03/2019  WBC 4.0 - 10.5 K/uL 6.1 - -  Hemoglobin 13.0 - 17.0 g/dL 7.9(L) 8.1(L) 8.1(L)  Hematocrit 39.0 - 52.0 % 23.8(L) 24.3(L) 24.5(L)  Platelets 150 - 400 K/uL 224 - -    CMP Latest Ref Rng & Units 06/04/2019 06/03/2019 06/02/2019  Glucose 70 - 99 mg/dL 383(H) 292(H) 201(H)  BUN 6 - 20 mg/dL 27(H) 25(H) 35(H)  Creatinine 0.61 - 1.24 mg/dL 1.93(H) 1.81(H) 2.14(H)  Sodium 135 - 145 mmol/L 137 140 145  Potassium 3.5 - 5.1 mmol/L 4.1 4.1 3.6  Chloride 98 - 111 mmol/L 110 112(H) 119(H)  CO2 22 - 32 mmol/L 21(L) 21(L) 22  Calcium 8.9 - 10.3 mg/dL 7.8(L) 8.1(L) 8.1(L)  Total Protein 6.5 - 8.1 g/dL - - -  Total Bilirubin 0.3 - 1.2 mg/dL - - -  Alkaline Phos 38 - 126 U/L - - -  AST 15 - 41 U/L - - -  ALT 0 - 44 U/L - - -   7/22 BC- candida albicans 05/31/19 BC- NG   Impression/Rcommendation  DKA- resolved   Candidemia on IV fluconazole- change to PO once he finishes endoscopy and start eating TEE neg, repeat blood culture from 7/25 NG- last date for fluconazole is 06/13/19 Currently dose adjusted for crcl of 40%. If the crcl increases to >50 then he can get the full dose of 400mg , otherwise it is the current dose of 200mg    AKI on CKD- improved Anemia Melena- EGD done showed some esophagitis  Discussed the management with the patient and told him to take fluconazole at home. He can follow-up with me as outpatient next week. Discussed the management with Dr. Estanislado Pandy.

## 2019-06-04 NOTE — Anesthesia Postprocedure Evaluation (Signed)
Anesthesia Post Note  Patient: Spencer [name]  Procedure(s) Performed: ESOPHAGOGASTRODUODENOSCOPY (EGD) WITH PROPOFOL (N/A )  Patient location during evaluation: Endoscopy Anesthesia Type: General Level of consciousness: awake and alert Pain management: pain level controlled Vital Signs Assessment: post-procedure vital signs reviewed and stable Respiratory status: spontaneous breathing, nonlabored ventilation, respiratory function stable and patient connected to nasal cannula oxygen Cardiovascular status: blood pressure returned to baseline and stable Postop Assessment: no apparent nausea or vomiting Anesthetic complications: no     Last Vitals:  Vitals:   06/04/19 1600 06/04/19 1652  BP: 135/80 129/75  Pulse: 62 64  Resp: 19   Temp:  36.8 C  SpO2: 100% 98%    Last Pain:  Vitals:   06/04/19 1652  TempSrc: Oral  PainSc:                  [name]

## 2019-06-04 NOTE — Anesthesia Procedure Notes (Signed)
Date/Time: 06/04/2019 3:18 PM Performed by: Johnna Acosta, CRNA Pre-anesthesia Checklist: Patient identified, Emergency Drugs available, Suction available, Patient being monitored and Timeout performed Patient Re-evaluated:Patient Re-evaluated prior to induction Oxygen Delivery Method: Nasal cannula Preoxygenation: Pre-oxygenation with 100% oxygen Induction Type: IV induction

## 2019-06-04 NOTE — Anesthesia Post-op Follow-up Note (Signed)
Anesthesia QCDR form completed.        

## 2019-06-04 NOTE — Progress Notes (Signed)
Inpatient Diabetes Program Recommendations  AACE/ADA: New Consensus Statement on Inpatient Glycemic Control   Target Ranges:  Prepandial:   less than 140 mg/dL      Peak postprandial:   less than 180 mg/dL (1-2 hours)      Critically ill patients:  140 - 180 mg/dL   Results for Spencer [name], Spencer [name] (MRN [id number]) as of 06/04/2019 07:52  Ref. Range 06/03/2019 07:52 06/03/2019 11:44 06/03/2019 16:57 06/03/2019 20:46 06/03/2019 22:48 06/04/2019 07:42  Glucose-Capillary Latest Ref Range: 70 - 99 mg/dL 179 (H)  Novolog 2 units 99 223 (H)  Novolog 9 units 407 (H) 478 (H)  Novolog 12 units  Lantus 24 units 237 (H)   Review of Glycemic Control  Diabetes history:DM2 Outpatient Diabetes medications:Lantus 24-40 units BID, Novolog 6-25 units TID with meals Current orders for Inpatient glycemic control:Lantus 24 units QHS, Novolog 0-9 TID with meals, Novolog 0-5 units QHS, Novolog 6 units TID with meals for meal coverage  Inpatient Diabetes Program Recommendations:  Insulin-Basal: Please consider increasing Lantus to 26 units QHS.  Insulin-Meal Coverage: When diet resumed, please consider increasing meal coverage to Novolog 8 units TID with meals if patient eats at least 50% of meals.  HbgA1C: A1C13.5% on 05/28/19 indicating an average glucose of 341 mg/dlover the past 2-3 months. Please make a referral to Dr. Honor Junes (Endocrinologist) for outpatient follow up and to establish care.  Thanks, Barnie Alderman, RN, MSN, CDE Diabetes Coordinator Inpatient Diabetes Program [phone] (Team Pager from 8am to 5pm)

## 2019-06-04 NOTE — Transfer of Care (Signed)
Immediate Anesthesia Transfer of Care Note  Patient: Spencer [name]  Procedure(s) Performed: ESOPHAGOGASTRODUODENOSCOPY (EGD) WITH PROPOFOL (N/A )  Patient Location: PACU  Anesthesia Type:General  Level of Consciousness: awake, alert  and oriented  Airway & Oxygen Therapy: Patient Spontanous Breathing and Patient connected to nasal cannula oxygen  Post-op Assessment: Report given to RN and Post -op Vital signs reviewed and stable  Post vital signs: Reviewed and stable  Last Vitals:  Vitals Value Taken Time  BP 118/78 06/04/19 1532  Temp 36.1 C 06/04/19 1530  Pulse 71 06/04/19 1532  Resp 28 06/04/19 1532  SpO2 99 % 06/04/19 1532  Vitals shown include unvalidated device data.  Last Pain:  Vitals:   06/04/19 1530  TempSrc: Tympanic  PainSc:          Complications: No apparent anesthesia complications

## 2019-06-04 NOTE — Anesthesia Preprocedure Evaluation (Addendum)
Anesthesia Evaluation  Patient identified by MRN, date of birth, ID band Patient awake    Reviewed: Allergy & Precautions, NPO status , Patient's Chart, lab work & pertinent test results, reviewed documented beta blocker date and time   Airway Mallampati: II  TM Distance: >3 FB     Dental  (+) Chipped, Dental Advisory Given, Poor Dentition   Pulmonary Current Smoker,           Cardiovascular + dysrhythmias      Neuro/Psych    GI/Hepatic   Endo/Other  diabetes  Renal/GU      Musculoskeletal   Abdominal   Peds  Hematology   Anesthesia Other Findings DKA. PVCs.  Reproductive/Obstetrics                            Anesthesia Physical Anesthesia Plan  ASA: III  Anesthesia Plan: General   Post-op Pain Management:    Induction: Intravenous  PONV Risk Score and Plan:   Airway Management Planned:   Additional Equipment:   Intra-op Plan:   Post-operative Plan:   Informed Consent: I have reviewed the patients History and Physical, chart, labs and discussed the procedure including the risks, benefits and alternatives for the proposed anesthesia with the patient or authorized representative who has indicated his/her understanding and acceptance.       Plan Discussed with: CRNA  Anesthesia Plan Comments:         Anesthesia Quick Evaluation

## 2019-06-04 NOTE — Progress Notes (Signed)
Patient ID: Spencer [name], male   DOB: 08/13/1961, 58 y.o.   MRN: [id number]  [facility] PROGRESS NOTE  [facility] WUJ:[id number]:[id number] DOB: 05/19/1961 DOA: 05/28/2019 PCP: Sherron Mondayejan-Sie, S Ahmed, MD  HPI/Subjective: Patient had his TEE which was negative.   Due for EGD by gastroenterology today No fever No shortness of breath  Objective: Vitals:   06/04/19 0438 06/04/19 0741  BP: 134/74 135/77  Pulse: 71 70  Resp: 20 18  Temp: 98.2 F (36.8 C) 99.2 F (37.3 C)  SpO2: 98% 97%    Intake/Output Summary (Last 24 hours) at 06/04/2019 1350 Last data filed at 06/04/2019 1051 Gross per 24 hour  Intake 240 ml  Output 2225 ml  Net -1985 ml   Filed Weights   06/03/19 0702 06/03/19 1238 06/04/19 0438  Weight: 69 kg 69 kg 69.2 kg    ROS: Review of Systems  Constitutional: Negative for chills, fever, malaise/fatigue and weight loss.  HENT: Negative for ear discharge, ear pain, hearing loss, nosebleeds and tinnitus.   Eyes: Negative for blurred vision, double vision, photophobia, pain and discharge.  Respiratory: Negative for cough, hemoptysis, sputum production and shortness of breath.   Cardiovascular: Negative for chest pain, palpitations, orthopnea and leg swelling.  Gastrointestinal: Negative for abdominal pain, blood in stool, constipation, diarrhea, heartburn, melena, nausea and vomiting.  Genitourinary: Negative for dysuria, frequency, hematuria and urgency.  Musculoskeletal: Negative for back pain, falls, joint pain, myalgias and neck pain.  Skin: Negative for itching.  Neurological: Negative for dizziness, tingling, tremors, sensory change and headaches.  Endo/Heme/Allergies: Negative for environmental allergies and polydipsia. Does not bruise/bleed easily.  Psychiatric/Behavioral: Negative for depression and suicidal ideas.   Exam: Physical Exam  HENT:  Nose: No mucosal edema.  Eyes: Pupils are equal, round, and reactive to light. Lids are normal.  Neck: Carotid bruit is not  present. No thyromegaly present.  Cardiovascular: Regular rhythm, S1 normal, S2 normal and normal heart sounds.  Pulses:      Dorsalis pedis pulses are 1+ on the right side and 1+ on the left side.  Respiratory: He has no decreased breath sounds. He has no wheezes. He has no rhonchi. He has no rales.  GI: Soft. Bowel sounds are normal. There is no abdominal tenderness.  Musculoskeletal:     Right ankle: He exhibits no swelling.     Left ankle: He exhibits no swelling.  Skin: Skin is warm. No rash noted. Nails show no clubbing.      Data Reviewed: Basic Metabolic Panel: Recent Labs  Lab 05/29/19 0526  05/30/19 0446 05/31/19 0037 06/01/19 [id number] 06/02/19 0228 06/03/19 0219 06/04/19 0238  NA 149*   < > 151* 152* 145 145 140 137  K 3.4*   < > 3.8 3.4* 3.4* 3.6 4.1 4.1  CL 121*   < > 128* 126* 116* 119* 112* 110  CO2 11*   < > 17* 19* 22 22 21* 21*  GLUCOSE 472*   < > 151* 190* 231* 201* 292* 383*  BUN 81*   < > 82* 63* 39* 35* 25* 27*  CREATININE 3.68*   < > 3.12* 2.89* 2.18* 2.14* 1.81* 1.93*  CALCIUM 7.5*   < > 8.5* 8.9 8.1* 8.1* 8.1* 7.8*  MG 2.1  --  1.8 2.2 1.8 1.8 2.3  --   PHOS <1.0*  --  1.6* 1.8* 3.4 3.3  --   --    < > = values in this interval not displayed.   Liver Function Tests:  Recent Labs  Lab 05/28/19 1638  AST 32  ALT 21  ALKPHOS 143*  BILITOT 1.9*  PROT 5.7*  ALBUMIN 3.6   CBC: Recent Labs  Lab 05/28/19 1638  05/29/19 1344 05/30/19 0446 05/31/19 0554  06/02/19 0228  06/03/19 0219 06/03/19 1033 06/03/19 1818 06/04/19 0238 06/04/19 1050  WBC 24.6*   < > 8.1 10.0 11.5*  --  7.8  --   --   --   --  6.1  --   NEUTROABS 20.7*  --  7.3  --   --   --   --   --   --   --   --   --   --   HGB 13.5   < > 11.4* 11.7* 10.0*   < > 8.3*   < > 8.1* 8.1* 8.1* 7.9* 8.0*  HCT 45.1   < > 31.2* 32.5* 28.1*   < > 24.6*   < > 24.9* 24.5* 24.3* 23.8* 23.9*  MCV 108.4*   < > 87.9 87.8 88.6  --  93.5  --   --   --   --  95.2  --   PLT 208   < > 74* 71* 62*  --   77*  --   --   --   --  224  --    < > = values in this interval not displayed.    CBG: Recent Labs  Lab 06/03/19 2046 06/03/19 2248 06/04/19 0631 06/04/19 0742 06/04/19 1149  GLUCAP 407* 478* 263* 237* 150*    Recent Results (from the past 240 hour(s))  Blood Culture (routine x 2)     Status: Abnormal (Preliminary result)   Collection Time: 05/28/19  4:38 PM   Specimen: BLOOD  Result Value Ref Range Status   Specimen Description   Final    BLOOD BLOOD LEFT ARM Performed at [facility], [address], [place], [place] [address]    Special Requests   Final    BOTTLES DRAWN AEROBIC AND ANAEROBIC Blood Culture adequate volume Performed at [facility], [address] [place], [place] [address]    Culture  Setup Time   Final    YEAST AEROBIC BOTTLE ONLY CRITICAL RESULT CALLED TO, READ BACK BY AND VERIFIED WITH: JASON ROBBINS ON 05/29/2019 AT 2057 QSD Performed at [facility], [address] [place], [place] [address]    Culture (A)  Final    CANDIDA ALBICANS Sent to [facility] for further susceptibility testing. Performed at [facility], [address] [place], [place] [address]    Report Status PENDING  Incomplete  Blood Culture (routine x 2)     Status: Abnormal   Collection Time: 05/28/19  4:38 PM   Specimen: BLOOD  Result Value Ref Range Status   Specimen Description   Final    BLOOD BLOOD RIGHT ARM Performed at [facility], [address] [place], [place] [address]    Special Requests   Final    BOTTLES DRAWN AEROBIC AND ANAEROBIC Blood Culture adequate volume Performed at [facility], [address] [place], [place] [address]    Culture  Setup Time   Final    YEAST IN BOTH AEROBIC AND ANAEROBIC BOTTLES CRITICAL RESULT CALLED TO, READ BACK BY AND VERIFIED WITH: JASON ROBBINS ON 05/29/2019 AT 2057 QSD Performed at [facility], [address] [place], [place] [address]    Culture CANDIDA  ALBICANS (A)  Final  Report Status 06/01/2019 FINAL  Final  Urine culture     Status: None   Collection Time: 05/28/19  4:38 PM   Specimen: In/Out Cath Urine  Result Value Ref Range Status   Specimen Description   Final    IN/OUT CATH URINE Performed at [facility], [address] [place], [place] [address]    Special Requests   Final    NONE Performed at [facility], [address] [place], [place] [address]    Culture   Final    NO GROWTH Performed at [facility], [address] [place], [place] [address]    Report Status 05/29/2019 FINAL  Final  SARS Coronavirus 2 (CEPHEID- Performed in [facility] lab), Hosp Order     Status: None   Collection Time: 05/28/19  4:38 PM   Specimen: Nasopharyngeal Swab  Result Value Ref Range Status   SARS Coronavirus 2 NEGATIVE NEGATIVE Final    Comment: (NOTE) If result is NEGATIVE SARS-CoV-2 target nucleic acids are NOT DETECTED. The SARS-CoV-2 RNA is generally detectable in upper and lower  respiratory specimens during the acute phase of infection. The lowest  concentration of SARS-CoV-2 viral copies this assay can detect is 250  copies / mL. A negative result does not preclude SARS-CoV-2 infection  and should not be used as the sole basis for treatment or other  patient management decisions.  A negative result may occur with  improper specimen collection / handling, submission of specimen other  than nasopharyngeal swab, presence of viral mutation(s) within the  areas targeted by this assay, and inadequate number of viral copies  (<250 copies / mL). A negative result must be combined with clinical  observations, patient history, and epidemiological information. If result is POSITIVE SARS-CoV-2 target nucleic acids are DETECTED. The SARS-CoV-2 RNA is generally detectable in upper and lower  respiratory specimens dur ing the acute phase of infection.  Positive  results are indicative of active  infection with SARS-CoV-2.  Clinical  correlation with patient history and other diagnostic information is  necessary to determine patient infection status.  Positive results do  not rule out bacterial infection or co-infection with other viruses. If result is PRESUMPTIVE POSTIVE SARS-CoV-2 nucleic acids MAY BE PRESENT.   A presumptive positive result was obtained on the submitted specimen  and confirmed on repeat testing.  While 2019 novel coronavirus  (SARS-CoV-2) nucleic acids may be present in the submitted sample  additional confirmatory testing may be necessary for epidemiological  and / or clinical management purposes  to differentiate between  SARS-CoV-2 and other Sarbecovirus currently known to infect humans.  If clinically indicated additional testing with an alternate test  methodology [phone] is advised. The SARS-CoV-2 RNA is generally  detectable in upper and lower respiratory sp ecimens during the acute  phase of infection. The expected result is Negative. Fact Sheet for Patients:  BoilerBrush.com.cyhttps://www.fda.gov/media/136312/download Fact Sheet for Healthcare Providers: https://pope.com/https://www.fda.gov/media/136313/download This test is not yet approved or cleared by the [place] FDA and has been authorized for detection and/or diagnosis of SARS-CoV-2 by FDA under an Emergency Use Authorization (EUA).  This EUA will remain in effect (meaning this test can be used) for the duration of the COVID-19 declaration under Section 564(b)(1) of the Act, 21 U.S.C. section 360bbb-3(b)(1), unless the authorization is terminated or revoked sooner. Performed at [facility], [address] [place], [place] [address]   Blood Culture ID Panel (Reflexed)     Status: Abnormal  Collection Time: 05/28/19  4:38 PM  Result Value Ref Range Status   Enterococcus species NOT DETECTED NOT DETECTED Final   Listeria monocytogenes NOT DETECTED NOT DETECTED Final   Staphylococcus species NOT DETECTED  NOT DETECTED Final   Staphylococcus aureus (BCID) NOT DETECTED NOT DETECTED Final   Streptococcus species NOT DETECTED NOT DETECTED Final   Streptococcus agalactiae NOT DETECTED NOT DETECTED Final   Streptococcus pneumoniae NOT DETECTED NOT DETECTED Final   Streptococcus pyogenes NOT DETECTED NOT DETECTED Final   Acinetobacter baumannii NOT DETECTED NOT DETECTED Final   Enterobacteriaceae species NOT DETECTED NOT DETECTED Final   Enterobacter cloacae complex NOT DETECTED NOT DETECTED Final   Escherichia coli NOT DETECTED NOT DETECTED Final   Klebsiella oxytoca NOT DETECTED NOT DETECTED Final   Klebsiella pneumoniae NOT DETECTED NOT DETECTED Final   Proteus species NOT DETECTED NOT DETECTED Final   Serratia marcescens NOT DETECTED NOT DETECTED Final   Haemophilus influenzae NOT DETECTED NOT DETECTED Final   Neisseria meningitidis NOT DETECTED NOT DETECTED Final   Pseudomonas aeruginosa NOT DETECTED NOT DETECTED Final   Candida albicans DETECTED (A) NOT DETECTED Final    Comment: CRITICAL RESULT CALLED TO, READ BACK BY AND VERIFIED WITH: JASON ROBBINS ON 05/29/2019 AT 2057 QSD    Candida glabrata NOT DETECTED NOT DETECTED Final   Candida krusei NOT DETECTED NOT DETECTED Final   Candida parapsilosis NOT DETECTED NOT DETECTED Final   Candida tropicalis NOT DETECTED NOT DETECTED Final    Comment: Performed at [facility], [address] [place], [place] [address]  MRSA PCR Screening     Status: None   Collection Time: 05/28/19  9:11 PM   Specimen: Nasopharyngeal  Result Value Ref Range Status   MRSA by PCR NEGATIVE NEGATIVE Final    Comment:        The GeneXpert MRSA Assay (FDA approved for NASAL specimens only), is one component of a comprehensive MRSA colonization surveillance program. It is not intended to diagnose MRSA infection nor to guide or monitor treatment for MRSA infections. Performed at [facility], [address] [place], [place] [address]    CULTURE, BLOOD (ROUTINE X 2) w Reflex to ID Panel     Status: None (Preliminary result)   Collection Time: 05/31/19 12:24 AM   Specimen: BLOOD  Result Value Ref Range Status   Specimen Description BLOOD BLOOD RIGHT FOREARM  Final   Special Requests   Final    BOTTLES DRAWN AEROBIC AND ANAEROBIC Blood Culture results may not be optimal due to an excessive volume of blood received in culture bottles   Culture   Final    NO GROWTH 4 DAYS Performed at [facility], [address], [place], [place] [address]    Report Status PENDING  Incomplete  CULTURE, BLOOD (ROUTINE X 2) w Reflex to ID Panel     Status: None (Preliminary result)   Collection Time: 05/31/19 12:35 AM   Specimen: BLOOD  Result Value Ref Range Status   Specimen Description BLOOD BLOOD RIGHT HAND  Final   Special Requests   Final    BOTTLES DRAWN AEROBIC AND ANAEROBIC Blood Culture adequate volume   Culture   Final    NO GROWTH 4 DAYS Performed at [facility], [address] [place], [place] [address]    Report Status PENDING  Incomplete     Studies: No results found.  Scheduled Meds: . Chlorhexidine Gluconate Cloth  6 each Topical Daily  . feeding  supplement (NEPRO CARB STEADY)  237 mL Oral BID BM  . folic acid  1 mg Oral Daily  . insulin aspart  0-5 Units Subcutaneous QHS  . insulin aspart  0-9 Units Subcutaneous TID WC  . insulin aspart  6 Units Subcutaneous TID WC  . insulin glargine  26 Units Subcutaneous QHS  . multivitamin with minerals  1 tablet Oral Daily  . pantoprazole  40 mg Oral Daily  . thiamine  100 mg Oral Daily  . vitamin C  250 mg Oral BID   Continuous Infusions: . sodium chloride 20 mL/hr at 06/03/19 1248  . sodium chloride    . fluconazole (DIFLUCAN) IV 200 mg (06/03/19 1803)    Assessment/Plan:  1. Sepsis with Candida.  Continue Diflucan.  We will switch to oral form. echocardiogram negative appreciate ID input, TEE is done and is negative  2. acute metabolic  encephalopathy.  Resolved  3. diabetic ketoacidosis.  Patient switched off insulin drip onto Lantus insulin and sliding scale.  Hemoglobin A1c elevated at 13.8. 4. Dark tarry stools continue PPIs plan for endoscopy today by gastroenterology 5. Drop in platelets.  Platelets improving 6. Acute kidney injury likely from volume depletion and diabetic ketoacidosis.   7. Hypokalemia and hypophosphatemia.  Pharmacist electrolyte protocol 8.  Hypernatremia.  Sodium is now normal     code Status: Full code      Code Status Orders  (From admission, onward)         Start     Ordered   05/28/19 1838  Full code  Continuous     05/28/19 1845        Code Status History    This patient has a current code status but no historical code status.   Advance Care Planning Activity      Disposition Plan: To be determined  Consultants:  Critical care specialist  Nephrology  Antibiotics:  Diflucan  Time spent: 34 minutes     [employer]

## 2019-06-04 NOTE — Interval H&P Note (Signed)
History and Physical Interval Note:  06/04/2019 3:10 PM  Spencer [name]  has presented today for surgery, with the diagnosis of Melena, anemia secondary to Gastrointestinal blood loss.  The various methods of treatment have been discussed with the patient and family. After consideration of risks, benefits and other options for treatment, the patient has consented to  Procedure(s): ESOPHAGOGASTRODUODENOSCOPY (EGD) WITH PROPOFOL (N/A) as a surgical intervention.  The patient's history has been reviewed, patient examined, no change in status, stable for surgery.  I have reviewed the patient's chart and labs.  Questions were answered to the patient's satisfaction.     [name], [place]

## 2019-06-04 NOTE — Progress Notes (Signed)
[facility]  ROUNDING NOTE   Subjective:   TEE negative.   Creatinine 1.93 (1.81)  Endoscopy for later today.   Objective:  Vital signs in last 24 hours:  Temp:  [98 F (36.7 C)-99.2 F (37.3 C)] 99.2 F (37.3 C) (07/29 0741) Pulse Rate:  [64-85] 70 (07/29 0741) Resp:  [14-35] 18 (07/29 0741) BP: (100-135)/(59-79) 135/77 (07/29 0741) SpO2:  [88 %-100 %] 97 % (07/29 0741) Weight:  [69 kg-69.2 kg] 69.2 kg (07/29 0438)  Weight change: -1.262 kg Filed Weights   06/03/19 0702 06/03/19 1238 06/04/19 0438  Weight: 69 kg 69 kg 69.2 kg    Intake/Output: I/O last 3 completed shifts: In: 1183.1 [P.O.:240; I.V.:943.1] Out: 3325 [NGEXB:2841]   Intake/Output this shift:  Total I/O In: -  Out: 1025 [Urine:1025]  Physical Exam: General: NAD, laying in bed  Head: Normocephalic, atraumatic. Moist oral mucosal membranes, poor dentition  Eyes: Anicteric, PERRL  Neck: Supple, trachea midline  Lungs:  Clear to auscultation  Heart: Regular rate and rhythm  Abdomen:  Soft, nontender  Extremities:  no peripheral edema.  Neurologic: Nonfocal, moving all four extremities  Skin: No lesions        Basic Metabolic Panel: Recent Labs  Lab 05/29/19 0526  05/30/19 0446 05/31/19 0037 06/01/19 0514 06/02/19 0228 06/03/19 0219 06/04/19 0238  NA 149*   < > 151* 152* 145 145 140 137  K 3.4*   < > 3.8 3.4* 3.4* 3.6 4.1 4.1  CL 121*   < > 128* 126* 116* 119* 112* 110  CO2 11*   < > 17* 19* 22 22 21* 21*  GLUCOSE 472*   < > 151* 190* 231* 201* 292* 383*  BUN 81*   < > 82* 63* 39* 35* 25* 27*  CREATININE 3.68*   < > 3.12* 2.89* 2.18* 2.14* 1.81* 1.93*  CALCIUM 7.5*   < > 8.5* 8.9 8.1* 8.1* 8.1* 7.8*  MG 2.1  --  1.8 2.2 1.8 1.8 2.3  --   PHOS <1.0*  --  1.6* 1.8* 3.4 3.3  --   --    < > = values in this interval not displayed.    Liver Function Tests: Recent Labs  Lab 05/28/19 1638  AST 32  ALT 21  ALKPHOS 143*  BILITOT 1.9*  PROT 5.7*  ALBUMIN 3.6   No  results for input(s): LIPASE, AMYLASE in the last 168 hours. No results for input(s): AMMONIA in the last 168 hours.  CBC: Recent Labs  Lab 05/28/19 1638  05/29/19 1344 05/30/19 0446 05/31/19 0554  06/02/19 0228  06/03/19 0219 06/03/19 1033 06/03/19 1818 06/04/19 0238 06/04/19 1050  WBC 24.6*   < > 8.1 10.0 11.5*  --  7.8  --   --   --   --  6.1  --   NEUTROABS 20.7*  --  7.3  --   --   --   --   --   --   --   --   --   --   HGB 13.5   < > 11.4* 11.7* 10.0*   < > 8.3*   < > 8.1* 8.1* 8.1* 7.9* 8.0*  HCT 45.1   < > 31.2* 32.5* 28.1*   < > 24.6*   < > 24.9* 24.5* 24.3* 23.8* 23.9*  MCV 108.4*   < > 87.9 87.8 88.6  --  93.5  --   --   --   --  95.2  --  PLT 208   < > 74* 71* 62*  --  77*  --   --   --   --  224  --    < > = values in this interval not displayed.    Cardiac Enzymes: No results for input(s): CKTOTAL, CKMB, CKMBINDEX, TROPONINI in the last 168 hours.  BNP: Invalid input(s): POCBNP  CBG: Recent Labs  Lab 06/03/19 1657 06/03/19 2046 06/03/19 2248 06/04/19 0742 06/04/19 1149  GLUCAP 223* 407* 478* 237* 150*    Microbiology: Results for orders placed or performed during the hospital encounter of 05/28/19  Blood Culture (routine x 2)     Status: Abnormal (Preliminary result)   Collection Time: 05/28/19  4:38 PM   Specimen: BLOOD  Result Value Ref Range Status   Specimen Description   Final    BLOOD BLOOD LEFT ARM Performed at [facility], [address] [place], [place] [address]    Special Requests   Final    BOTTLES DRAWN AEROBIC AND ANAEROBIC Blood Culture adequate volume Performed at [facility], [address] [place], [place] [address]    Culture  Setup Time   Final    YEAST AEROBIC BOTTLE ONLY CRITICAL RESULT CALLED TO, READ BACK BY AND VERIFIED WITH: JASON ROBBINS ON 05/29/2019 AT 2057 QSD Performed at [facility], [address] [place], [place] [address]    Culture (A)  Final    CANDIDA ALBICANS Sent  to [facility] for further susceptibility testing. Performed at [facility], [address] [place], [place] [address]    Report Status PENDING  Incomplete  Blood Culture (routine x 2)     Status: Abnormal   Collection Time: 05/28/19  4:38 PM   Specimen: BLOOD  Result Value Ref Range Status   Specimen Description   Final    BLOOD BLOOD RIGHT ARM Performed at [facility], [address] [place], [place] [address]    Special Requests   Final    BOTTLES DRAWN AEROBIC AND ANAEROBIC Blood Culture adequate volume Performed at [facility], [address] [place], [place] [address]    Culture  Setup Time   Final    YEAST IN BOTH AEROBIC AND ANAEROBIC BOTTLES CRITICAL RESULT CALLED TO, READ BACK BY AND VERIFIED WITH: JASON ROBBINS ON 05/29/2019 AT 2057 QSD Performed at [facility], [address] [place], [place] [address]    Culture CANDIDA ALBICANS (A)  Final   Report Status 06/01/2019 FINAL  Final  Urine culture     Status: None   Collection Time: 05/28/19  4:38 PM   Specimen: In/Out Cath Urine  Result Value Ref Range Status   Specimen Description   Final    IN/OUT CATH URINE Performed at [facility], [address] [place], [place] [address]    Special Requests   Final    NONE Performed at [facility], [address] [place], [place] [address]    Culture   Final    NO GROWTH Performed at [facility], [address] [place], [place] [address]    Report Status 05/29/2019 FINAL  Final  SARS Coronavirus 2 (CEPHEID- Performed in [facility] lab), Hosp Order     Status: None   Collection Time: 05/28/19  4:38 PM   Specimen: Nasopharyngeal Swab  Result Value Ref Range Status   SARS Coronavirus 2 NEGATIVE NEGATIVE Final    Comment: (NOTE) If result is  NEGATIVE SARS-CoV-2 target nucleic acids are NOT DETECTED. The SARS-CoV-2 RNA is generally detectable in upper and lower  respiratory specimens during the  acute phase of infection. The lowest  concentration of SARS-CoV-2 viral copies this assay can detect is 250  copies / mL. A negative result does not preclude SARS-CoV-2 infection  and should not be used as the sole basis for treatment or other  patient management decisions.  A negative result may occur with  improper specimen collection / handling, submission of specimen other  than nasopharyngeal swab, presence of viral mutation(s) within the  areas targeted by this assay, and inadequate number of viral copies  (<250 copies / mL). A negative result must be combined with clinical  observations, patient history, and epidemiological information. If result is POSITIVE SARS-CoV-2 target nucleic acids are DETECTED. The SARS-CoV-2 RNA is generally detectable in upper and lower  respiratory specimens dur ing the acute phase of infection.  Positive  results are indicative of active infection with SARS-CoV-2.  Clinical  correlation with patient history and other diagnostic information is  necessary to determine patient infection status.  Positive results do  not rule out bacterial infection or co-infection with other viruses. If result is PRESUMPTIVE POSTIVE SARS-CoV-2 nucleic acids MAY BE PRESENT.   A presumptive positive result was obtained on the submitted specimen  and confirmed on repeat testing.  While 2019 novel coronavirus  (SARS-CoV-2) nucleic acids may be present in the submitted sample  additional confirmatory testing may be necessary for epidemiological  and / or clinical management purposes  to differentiate between  SARS-CoV-2 and other Sarbecovirus currently known to infect humans.  If clinically indicated additional testing with an alternate test  methodology [phone]([id number]) is advised. The SARS-CoV-2 RNA is generally  detectable in upper and lower respiratory sp ecimens during the acute  phase of infection. The expected result is Negative. Fact Sheet for Patients:   BoilerBrush.com.cyhttps://www.fda.gov/media/136312/download Fact Sheet for Healthcare Providers: https://pope.com/https://www.fda.gov/media/136313/download This test is not yet approved or cleared by the [place] FDA and has been authorized for detection and/or diagnosis of SARS-CoV-2 by FDA under an Emergency Use Authorization (EUA).  This EUA will remain in effect (meaning this test can be used) for the duration of the COVID-19 declaration under Section 564(b)(1) of the Act, 21 U.S.C. section 360bbb-3(b)(1), unless the authorization is terminated or revoked sooner. Performed at [facility], [address] [place], [place] [address]   Blood Culture ID Panel (Reflexed)     Status: Abnormal   Collection Time: 05/28/19  4:38 PM  Result Value Ref Range Status   Enterococcus species NOT DETECTED NOT DETECTED Final   Listeria monocytogenes NOT DETECTED NOT DETECTED Final   Staphylococcus species NOT DETECTED NOT DETECTED Final   Staphylococcus aureus (BCID) NOT DETECTED NOT DETECTED Final   Streptococcus species NOT DETECTED NOT DETECTED Final   Streptococcus agalactiae NOT DETECTED NOT DETECTED Final   Streptococcus pneumoniae NOT DETECTED NOT DETECTED Final   Streptococcus pyogenes NOT DETECTED NOT DETECTED Final   Acinetobacter baumannii NOT DETECTED NOT DETECTED Final   Enterobacteriaceae species NOT DETECTED NOT DETECTED Final   Enterobacter cloacae complex NOT DETECTED NOT DETECTED Final   Escherichia coli NOT DETECTED NOT DETECTED Final   Klebsiella oxytoca NOT DETECTED NOT DETECTED Final   Klebsiella pneumoniae NOT DETECTED NOT DETECTED Final   Proteus species NOT DETECTED NOT DETECTED Final   Serratia marcescens NOT DETECTED NOT DETECTED Final   Haemophilus influenzae NOT DETECTED NOT DETECTED Final  Neisseria meningitidis NOT DETECTED NOT DETECTED Final   Pseudomonas aeruginosa NOT DETECTED NOT DETECTED Final   Candida albicans DETECTED (A) NOT DETECTED Final    Comment: CRITICAL RESULT  CALLED TO, READ BACK BY AND VERIFIED WITH: JASON ROBBINS ON 05/29/2019 AT 2057 QSD    Candida glabrata NOT DETECTED NOT DETECTED Final   Candida krusei NOT DETECTED NOT DETECTED Final   Candida parapsilosis NOT DETECTED NOT DETECTED Final   Candida tropicalis NOT DETECTED NOT DETECTED Final    Comment: Performed at [facility], [address] [place], [place] [address]  MRSA PCR Screening     Status: None   Collection Time: 05/28/19  9:11 PM   Specimen: Nasopharyngeal  Result Value Ref Range Status   MRSA by PCR NEGATIVE NEGATIVE Final    Comment:        The GeneXpert MRSA Assay (FDA approved for NASAL specimens only), is one component of a comprehensive MRSA colonization surveillance program. It is not intended to diagnose MRSA infection nor to guide or monitor treatment for MRSA infections. Performed at [facility], [address] [place], [place] [address]   CULTURE, BLOOD (ROUTINE X 2) w Reflex to ID Panel     Status: None (Preliminary result)   Collection Time: 05/31/19 12:24 AM   Specimen: BLOOD  Result Value Ref Range Status   Specimen Description BLOOD BLOOD RIGHT FOREARM  Final   Special Requests   Final    BOTTLES DRAWN AEROBIC AND ANAEROBIC Blood Culture results may not be optimal due to an excessive volume of blood received in culture bottles   Culture   Final    NO GROWTH 4 DAYS Performed at [facility], [address], [place], [place] [address]    Report Status PENDING  Incomplete  CULTURE, BLOOD (ROUTINE X 2) w Reflex to ID Panel     Status: None (Preliminary result)   Collection Time: 05/31/19 12:35 AM   Specimen: BLOOD  Result Value Ref Range Status   Specimen Description BLOOD BLOOD RIGHT HAND  Final   Special Requests   Final    BOTTLES DRAWN AEROBIC AND ANAEROBIC Blood Culture adequate volume   Culture   Final    NO GROWTH 4 DAYS Performed at [facility], [address] [place], [place] [address]     Report Status PENDING  Incomplete    Coagulation Studies: No results for input(s): LABPROT, INR in the last 72 hours.  Urinalysis: No results for input(s): COLORURINE, LABSPEC, PHURINE, GLUCOSEU, HGBUR, BILIRUBINUR, KETONESUR, PROTEINUR, UROBILINOGEN, NITRITE, LEUKOCYTESUR in the last 72 hours.  Invalid input(s): APPERANCEUR    Imaging: No results found.   Medications:   . sodium chloride 20 mL/hr at 06/03/19 1248  . sodium chloride    . fluconazole (DIFLUCAN) IV 200 mg (06/03/19 1803)   . Chlorhexidine Gluconate Cloth  6 each Topical Daily  . feeding supplement (NEPRO CARB STEADY)  237 mL Oral BID BM  . folic acid  1 mg Oral Daily  . insulin aspart  0-5 Units Subcutaneous QHS  . insulin aspart  0-9 Units Subcutaneous TID WC  . insulin aspart  6 Units Subcutaneous TID WC  . insulin glargine  24 Units Subcutaneous QHS  . multivitamin with minerals  1 tablet Oral Daily  . pantoprazole  40 mg Oral Daily  . thiamine  100 mg Oral Daily  . vitamin C  250 mg Oral BID   acetaminophen **OR** acetaminophen, dextrose, ondansetron **OR** ondansetron (  ZOFRAN) IV  Assessment/ Plan:  Mr. Spencer [name] is a 58 y.o. white male withdiabetes mellitus type 2, who [facility] evaluation of sepsis with fungemia,altered mental status. Found to have DKA, GI bleed requiring PRBC transfusion and acute renal failure.   1. Acute renal failure secondary to severe volume depletion and diabetic ketoacidosis.  Unknown baseline creatinine. Pending records from PCP.  - Off IV fluids. Nonoliguric urine output. No indication for dialysis. Encourage PO intake.   2. Metabolic acidosis: secondary to diabetic ketoacidosis. Not on buffering agent.   3. 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 Hemoglobin A1c of 13.8% on 7/24.   4. Anemia with renal failure: concern for GI bleed. Hemoglobin dropped to 8 - Appreciate GI input.  Endoscopy for later today.  5. Sepsis: fungemia: candida in blood cultures on 7/22. Hemodynamically stable. Off vasopressors.  - fluconazole IV - Appreciate ID input.     LOS: 7 Ave[address] [place] 7/29/202012:04 PM

## 2019-06-04 NOTE — Progress Notes (Signed)
Pharmacy Electrolyte Monitoring Consult:  Pharmacy consulted to assist in monitoring and replacing electrolytes in this 58 y.o. male admitted on 05/28/2019. Patient with candida albicans fungemia and is to undergo TEE on 7/28. Patient with history of crack cocaine abuse.   Labs:  Sodium (mmol/L)  Date Value  06/04/2019 137   Potassium (mmol/L)  Date Value  06/04/2019 4.1   Magnesium (mg/dL)  Date Value  06/03/2019 2.3   Phosphorus (mg/dL)  Date Value  06/02/2019 3.3   Calcium (mg/dL)  Date Value  06/04/2019 7.8 (L)   Albumin (g/dL)  Date Value  05/28/2019 3.6    Assessment/Plan: No replacement needed at this time.   Goal potassium ~ 4 and goal magnesium ~ 2.   Will obtain electrolytes with am labs.   Pharmacy will continue to monitor and adjust per consult.   Rocky Morel 06/04/2019 8:33 AM

## 2019-06-04 NOTE — Op Note (Signed)
[facility] Gastroenterology Patient Name: Spencer [name] Procedure Date: 06/04/2019 3:07 PM MRN: [id number] Account #: [id number] Date of Birth: [birth year] Admit Type: Outpatient Age: 58 Room: [facility] ENDO ROOM 3 Gender: Male Note Status: Finalized Procedure:            Upper GI endoscopy Indications:          Acute post hemorrhagic anemia, Melena Providers:            Benay Pike. Malaia Buchta MD, MD Medicines:            Propofol per Anesthesia Complications:        No immediate complications. Procedure:            Pre-Anesthesia Assessment:                       - The risks and benefits of the procedure and the                        sedation options and risks were discussed with the                        patient. All questions were answered and informed                        consent was obtained.                       - Patient identification and proposed procedure were                        verified prior to the procedure. The procedure was                        verified in the procedure room.                       - ASA Grade Assessment: III - A patient with severe                        systemic disease.                       - After reviewing the risks and benefits, the patient                        was deemed in satisfactory condition to undergo the                        procedure.                       After obtaining informed consent, the endoscope was                        passed under direct vision. Throughout the procedure,                        the patient's blood pressure, pulse, and oxygen                        saturations were monitored continuously. The Endoscope  was introduced through the mouth, and advanced to the                        third part of duodenum. The upper GI endoscopy was                        accomplished without difficulty. The patient tolerated                        the procedure well. Findings:      LA  Grade A (one or more mucosal breaks less than 5 mm, not extending       between tops of 2 mucosal folds) esophagitis with no bleeding was found       at the gastroesophageal junction.      A 2 cm hiatal hernia was present.      The examined duodenum was normal.      The exam was otherwise without abnormality. Impression:           - LA Grade A esophagitis.                       - 2 cm hiatal hernia.                       - Normal examined duodenum.                       - The examination was otherwise normal.                       - No specimens collected. Recommendation:       - Return patient to hospital ward for ongoing care.                       - Perform a colonoscopy outpatient. Procedure Code(s):    --- Professional ---                       [phone], Esophagogastroduodenoscopy, flexible, transoral;                        diagnostic, including collection of specimen(s) by                        brushing or washing, when performed (separate procedure) Diagnosis Code(s):    --- Professional ---                       K92.1, Melena (includes Hematochezia)                       K44.9, Diaphragmatic hernia without obstruction or                        gangrene                       K20.9, Esophagitis, unspecified                       D62, Acute posthemorrhagic anemia CPT copyright 2019 American Medical Association. All rights reserved. The codes documented in this report are preliminary and upon coder review may  be revised to  meet current compliance requirements. [name] MD, MD 06/04/2019 3:27:56 PM This report has been signed electronically. Number of Addenda: 0 Note Initiated On: 06/04/2019 3:07 PM Estimated Blood Loss: Estimated blood loss: none.      [facility]

## 2019-06-04 NOTE — Interval H&P Note (Signed)
History and Physical Interval Note:  06/04/2019 2:31 PM  Spencer [name]  has presented today for surgery, with the diagnosis of Melena, anemia secondary to Gastrointestinal blood loss.  The various methods of treatment have been discussed with the patient and family. After consideration of risks, benefits and other options for treatment, the patient has consented to  Procedure(s): ESOPHAGOGASTRODUODENOSCOPY (EGD) WITH PROPOFOL (N/A) as a surgical intervention.  The patient's history has been reviewed, patient examined, no change in status, stable for surgery.  I have reviewed the patient's chart and labs.  Questions were answered to the patient's satisfaction.     [place], [place]

## 2019-06-05 ENCOUNTER — Encounter: Payer: Self-pay | Admitting: Internal Medicine

## 2019-06-05 LAB — CULTURE, BLOOD (ROUTINE X 2)
Culture: NO GROWTH
Culture: NO GROWTH
Special Requests: ADEQUATE

## 2019-06-05 NOTE — Discharge Summary (Signed)
[facility] NAME: Spencer [name]    MR#:  [id number]  DATE OF BIRTH:  Sep 20, 1961  DATE OF ADMISSION:  05/28/2019 ADMITTING PHYSICIAN: Epifanio Lesches, MD  DATE OF DISCHARGE: 06/04/2019  6:39 PM  PRIMARY CARE PHYSICIAN: Jodi Marble, MD   ADMISSION DIAGNOSIS:  High anion gap metabolic acidosis [A25.0] Acute encephalopathy [G93.40] Type 1 diabetes mellitus with ketoacidotic coma (HCC) [E10.11] Sepsis with acute renal failure, due to unspecified organism, unspecified acute renal failure type, unspecified whether septic shock present ([place]) [A41.9, R65.20, N17.9]  DISCHARGE DIAGNOSIS:  Active Problems:   DKA (diabetic ketoacidoses) (HCC) High anion gap metabolic acidosis Sepsis Hypothermia Acute kidney injury Sepsis with Candida infection Acute metabolic encephalopathy Anemia Hypokalemia Hypernatremia Hypophosphatemia SECONDARY DIAGNOSIS:   Past Medical History:  Diagnosis Date  . Brain aneurysm   . Broken bones    clavical, ankle, arms toes wriast   . Diabetes mellitus without complication ([place])   . Dysrhythmia   . GERD (gastroesophageal reflux disease)   . Hypertension   . Substance abuse ([place])      ADMITTING HISTORY Red Christians  is a 58 y.o. male with no medical history brought in for altered mental status.  Patient presented for confusion, elevated blood sugar and also found to have core body temperature of 88 Fahrenheit.  Unable to reach any family member.  No other information available, patient is completely altered.  HOSPITAL COURSE:  Patient was admitted to ICU for diabetic ketoacidosis.  Patient COVID-19 test was negative.  Received IV fluids for sepsis and broad-spectrum antibiotics vancomycin and cefepime.  Insulin drip was started for diabetic ketoacidosis along with bicarbonate.  Intensivist consult was done.  Blood cultures grew Candida albicans and patient received IV fluconazole during the  hospitalization.  Infectious disease consultation was done.  Transesophageal echocardiogram was negative for any growth.  Urine culture did not reveal any growth.  Patient's diabetic ketoacidosis resolved.  Metabolic acidosis also resolved.  He was switched to oral fluconazole.  Patient was evaluated by gastroenterology for dark tarry stools.  He had endoscopy done by gastroenterology.  Endoscopy revealed esophagitis and hiatal hernia.  Outpatient colonoscopy has been recommended.  Hemoglobin post endoscopy has been stable.  Patient will continue oral proton pump inhibitor and oral fluconazole and follow-up with infectious disease in the clinic.  CONSULTS OBTAINED:  Treatment Team:  Efrain Sella, MD  DRUG ALLERGIES:  No Known Allergies  DISCHARGE MEDICATIONS:   Allergies as of 06/04/2019   No Known Allergies     Medication List    TAKE these medications   fluconazole 200 MG tablet Commonly known as: DIFLUCAN Take 1 tablet (200 mg total) by mouth daily for 9 days.   folic acid 1 MG tablet Commonly known as: FOLVITE Take 1 tablet (1 mg total) by mouth daily.   insulin aspart 100 UNIT/ML injection Commonly known as: novoLOG Inject 1 Units into the skin 3 (three) times daily before meals. Sliding scale   insulin glargine 100 UNIT/ML injection Commonly known as: LANTUS Inject 40 Units into the skin daily.   pantoprazole 40 MG tablet Commonly known as: PROTONIX Take 1 tablet (40 mg total) by mouth daily.   thiamine 100 MG tablet Take 1 tablet (100 mg total) by mouth daily for 15 days.       Today  Patient seen and evaluated on the day of discharge Tolerating diet well No fever Tolerated endoscopy well Hemodynamically stable VITAL SIGNS:  Blood pressure 129/75, pulse 64, temperature 98.3 F (36.8 C), temperature source Oral, resp. rate 19, height 6\' 1"  (1.854 m), weight 70.8 kg, SpO2 98 %.  I/O:    Intake/Output Summary (Last 24 hours) at 06/05/2019 1342 Last  data filed at 06/04/2019 1524 Gross per 24 hour  Intake 50 ml  Output -  Net 50 ml    PHYSICAL EXAMINATION:  Physical Exam  GENERAL:  58 y.o.-year-old patient lying in the bed with no acute distress.  LUNGS: Normal breath sounds bilaterally, no wheezing, rales,rhonchi or crepitation. No use of accessory muscles of respiration.  CARDIOVASCULAR: S1, S2 normal. No murmurs, rubs, or gallops.  ABDOMEN: Soft, non-tender, non-distended. Bowel sounds present. No organomegaly or mass.  NEUROLOGIC: Moves all 4 extremities. PSYCHIATRIC: The patient is alert and oriented x 3.  SKIN: No obvious rash, lesion, or ulcer.   DATA REVIEW:   CBC Recent Labs  Lab 06/04/19 0238 06/04/19 1050  WBC 6.1  --   HGB 7.9* 8.0*  HCT 23.8* 23.9*  PLT 224  --     Chemistries  Recent Labs  Lab 06/03/19 0219 06/04/19 0238  NA 140 137  K 4.1 4.1  CL 112* 110  CO2 21* 21*  GLUCOSE 292* 383*  BUN 25* 27*  CREATININE 1.81* 1.93*  CALCIUM 8.1* 7.8*  MG 2.3  --     Cardiac Enzymes No results for input(s): TROPONINI in the last 168 hours.  Microbiology Results  Results for orders placed or performed during the hospital encounter of 05/28/19  Blood Culture (routine x 2)     Status: Abnormal (Preliminary result)   Collection Time: 05/28/19  4:38 PM   Specimen: BLOOD  Result Value Ref Range Status   Specimen Description   Final    BLOOD BLOOD LEFT ARM Performed at [facility], [address] [place], [place] [address]    Special Requests   Final    BOTTLES DRAWN AEROBIC AND ANAEROBIC Blood Culture adequate volume Performed at [facility], [address] [place], [place] [address]    Culture  Setup Time   Final    YEAST AEROBIC BOTTLE ONLY CRITICAL RESULT CALLED TO, READ BACK BY AND VERIFIED WITH: JASON ROBBINS ON 05/29/2019 AT 2057 QSD Performed at [facility], [address] [place], [place] [address]    Culture (A)  Final    CANDIDA ALBICANS Sent to  [facility] for further susceptibility testing. Performed at [facility], [address] [place], [place] [address]    Report Status PENDING  Incomplete  Blood Culture (routine x 2)     Status: Abnormal   Collection Time: 05/28/19  4:38 PM   Specimen: BLOOD  Result Value Ref Range Status   Specimen Description   Final    BLOOD BLOOD RIGHT ARM Performed at [facility], [address] [place], [place] [address]    Special Requests   Final    BOTTLES DRAWN AEROBIC AND ANAEROBIC Blood Culture adequate volume Performed at [facility], [address] [place], [place] [address]    Culture  Setup Time   Final    YEAST IN BOTH AEROBIC AND ANAEROBIC BOTTLES CRITICAL RESULT CALLED TO, READ BACK BY AND VERIFIED WITH: JASON ROBBINS ON 05/29/2019 AT 2057 QSD Performed at [facility], [address] [place], [place] [address]    Culture CANDIDA ALBICANS (A)  Final   Report Status 06/01/2019 FINAL  Final  Urine culture  Status: None   Collection Time: 05/28/19  4:38 PM   Specimen: In/Out Cath Urine  Result Value Ref Range Status   Specimen Description   Final    IN/OUT CATH URINE Performed at [facility], [address] [place], [place] [address]    Special Requests   Final    NONE Performed at [facility], [address] [place], [place] [address]    Culture   Final    NO GROWTH Performed at [facility], [address] [place], [place] [address]    Report Status 05/29/2019 FINAL  Final  SARS Coronavirus 2 (CEPHEID- Performed in [facility]), Hosp Order     Status: None   Collection Time: 05/28/19  4:38 PM   Specimen: Nasopharyngeal Swab  Result Value Ref Range Status   SARS Coronavirus 2 NEGATIVE NEGATIVE Final    Comment: (NOTE) If result is NEGATIVE SARS-CoV-2 target nucleic acids are NOT DETECTED. The SARS-CoV-2 RNA is generally detectable in upper and lower  respiratory specimens during the acute  phase of infection. The lowest  concentration of SARS-CoV-2 viral copies this assay can detect is 250  copies / mL. A negative result does not preclude SARS-CoV-2 infection  and should not be used as the sole basis for treatment or other  patient management decisions.  A negative result may occur with  improper specimen collection / handling, submission of specimen other  than nasopharyngeal swab, presence of viral mutation(s) within the  areas targeted by this assay, and inadequate number of viral copies  (<250 copies / mL). A negative result must be combined with clinical  observations, patient history, and epidemiological information. If result is POSITIVE SARS-CoV-2 target nucleic acids are DETECTED. The SARS-CoV-2 RNA is generally detectable in upper and lower  respiratory specimens dur ing the acute phase of infection.  Positive  results are indicative of active infection with SARS-CoV-2.  Clinical  correlation with patient history and other diagnostic information is  necessary to determine patient infection status.  Positive results do  not rule out bacterial infection or co-infection with other viruses. If result is PRESUMPTIVE POSTIVE SARS-CoV-2 nucleic acids MAY BE PRESENT.   A presumptive positive result was obtained on the submitted specimen  and confirmed on repeat testing.  While 2019 novel coronavirus  (SARS-CoV-2) nucleic acids may be present in the submitted sample  additional confirmatory testing may be necessary for epidemiological  and / or clinical management purposes  to differentiate between  SARS-CoV-2 and other Sarbecovirus currently known to infect humans.  If clinically indicated additional testing with an alternate test  methodology [phone]([id number]) is advised. The SARS-CoV-2 RNA is generally  detectable in upper and lower respiratory sp ecimens during the acute  phase of infection. The expected result is Negative. Fact Sheet for Patients:   BoilerBrush.com.cyhttps://www.fda.gov/media/136312/download Fact Sheet for Healthcare Providers: https://pope.com/https://www.fda.gov/media/136313/download This test is not yet approved or cleared by the [place] FDA and has been authorized for detection and/or diagnosis of SARS-CoV-2 by FDA under an Emergency Use Authorization (EUA).  This EUA will remain in effect (meaning this test can be used) for the duration of the COVID-19 declaration under Section 564(b)(1) of the Act, 21 U.S.C. section 360bbb-3(b)(1), unless the authorization is terminated or revoked sooner. Performed at [facility], [address] [place], [place] [address]   Blood Culture ID Panel (Reflexed)     Status: Abnormal   Collection Time: 05/28/19  4:38 PM  Result Value Ref  Range Status   Enterococcus species NOT DETECTED NOT DETECTED Final   Listeria monocytogenes NOT DETECTED NOT DETECTED Final   Staphylococcus species NOT DETECTED NOT DETECTED Final   Staphylococcus aureus (BCID) NOT DETECTED NOT DETECTED Final   Streptococcus species NOT DETECTED NOT DETECTED Final   Streptococcus agalactiae NOT DETECTED NOT DETECTED Final   Streptococcus pneumoniae NOT DETECTED NOT DETECTED Final   Streptococcus pyogenes NOT DETECTED NOT DETECTED Final   Acinetobacter baumannii NOT DETECTED NOT DETECTED Final   Enterobacteriaceae species NOT DETECTED NOT DETECTED Final   Enterobacter cloacae complex NOT DETECTED NOT DETECTED Final   Escherichia coli NOT DETECTED NOT DETECTED Final   Klebsiella oxytoca NOT DETECTED NOT DETECTED Final   Klebsiella pneumoniae NOT DETECTED NOT DETECTED Final   Proteus species NOT DETECTED NOT DETECTED Final   Serratia marcescens NOT DETECTED NOT DETECTED Final   Haemophilus influenzae NOT DETECTED NOT DETECTED Final   Neisseria meningitidis NOT DETECTED NOT DETECTED Final   Pseudomonas aeruginosa NOT DETECTED NOT DETECTED Final   Candida albicans DETECTED (A) NOT DETECTED Final    Comment: CRITICAL RESULT  CALLED TO, READ BACK BY AND VERIFIED WITH: JASON ROBBINS ON 05/29/2019 AT 2057 QSD    Candida glabrata NOT DETECTED NOT DETECTED Final   Candida krusei NOT DETECTED NOT DETECTED Final   Candida parapsilosis NOT DETECTED NOT DETECTED Final   Candida tropicalis NOT DETECTED NOT DETECTED Final    Comment: Performed at [facility], [address] [place], [place] [address]  MRSA PCR Screening     Status: None   Collection Time: 05/28/19  9:11 PM   Specimen: Nasopharyngeal  Result Value Ref Range Status   MRSA by PCR NEGATIVE NEGATIVE Final    Comment:        The GeneXpert MRSA Assay (FDA approved for NASAL specimens only), is one component of a comprehensive MRSA colonization surveillance program. It is not intended to diagnose MRSA infection nor to guide or monitor treatment for MRSA infections. Performed at [facility], [address] [place], [place] [address]   CULTURE, BLOOD (ROUTINE X 2) w Reflex to ID Panel     Status: None   Collection Time: 05/31/19 12:24 AM   Specimen: BLOOD  Result Value Ref Range Status   Specimen Description BLOOD BLOOD RIGHT FOREARM  Final   Special Requests   Final    BOTTLES DRAWN AEROBIC AND ANAEROBIC Blood Culture results may not be optimal due to an excessive volume of blood received in culture bottles   Culture   Final    NO GROWTH 5 DAYS Performed at [facility], [address] [place], [place] [address]    Report Status 06/05/2019 FINAL  Final  CULTURE, BLOOD (ROUTINE X 2) w Reflex to ID Panel     Status: None   Collection Time: 05/31/19 12:35 AM   Specimen: BLOOD  Result Value Ref Range Status   Specimen Description BLOOD BLOOD RIGHT HAND  Final   Special Requests   Final    BOTTLES DRAWN AEROBIC AND ANAEROBIC Blood Culture adequate volume   Culture   Final    NO GROWTH 5 DAYS Performed at [facility], [address] [place], [place] [address]    Report Status 06/05/2019 FINAL  Final     RADIOLOGY:  No results found.  Follow up with PCP in 1 week.  Management plans discussed with the patient, family and they are in agreement.  CODE STATUS: Full code Code  Status History    Date Active Date Inactive Code Status Order ID Comments User Context   05/28/2019 1845 06/04/2019 2148 Full Code [id number]  Katha Hamming, MD ED   02/19/2019 0122 02/19/2019 1901 Full Code [id number]  Oralia Manis, MD Inpatient   02/08/2019 0130 02/13/2019 1546 Full Code [id number]  Arnaldo Natal, MD ED   01/17/2019 0412 01/18/2019 1903 Full Code [id number]  Mansy, Vernetta Honey, MD ED   01/06/2019 1729 01/08/2019 1427 Full Code [id number]  Alford Highland, MD ED   10/25/2018 1356 10/27/2018 1345 Full Code [id number]  Auburn Bilberry, MD Inpatient   10/23/2018 1828 10/24/2018 1759 Full Code [id number]  Auburn Bilberry, MD Inpatient   01/15/2017 1417 01/18/2017 1716 Full Code [id number]  Enid Baas, MD Inpatient   11/06/2016 0319 11/09/2016 1508 Full Code [id number]  Marton Redwood Mordecai Rasmussen, MD Inpatient   11/06/2016 0229 11/06/2016 0319 Full Code [id number]  de Casandra Doffing, MD Inpatient   04/04/2015 0008 04/06/2015 1418 Full Code [id number]  Haydee Monica, MD Inpatient   Advance Care Planning Activity      TOTAL TIME TAKING CARE OF THIS PATIENT ON DAY OF DISCHARGE: more than 35 minutes.   Ihor Austin M.D on 06/05/2019 at 1:42 PM  Between 7am to 6pm - Pager - [phone]  After 6pm go to [email or web address] - password [email or web address] [facility]  [facility] [place] [facility]  Office  [phone]  CC: Primary care physician; [name], MD  Note: This dictation was prepared with Dragon dictation along with smaller phrase technology. Any transcriptional errors that result from this process are unintentional.

## 2019-06-05 NOTE — Progress Notes (Signed)
Advanced care plan. Purpose of the Encounter: CODE STATUS Parties in Attendance: Patient Patient's Decision Capacity: Good Subjective/Patient's story: Red Christians  is a 58 y.o. male with no medical history brought in for altered mental status.  Patient presented for confusion, elevated blood sugar and also found to have core body temperature of 88 Fahrenheit.  Unable to reach any family member.  No other information available, patient is completely altered. Objective/Medical story Patient needs IV insulin drip for diabetic ketoacidosis.  Needs IV fluids for sepsis. Needs broad-spectrum antibiotics and antifungal agents for sepsis management. Goals of care determination:  Advance care directives and goals of cared discussed Patient wants everything done which includes cpr, intubation and ventilator if need arises. CODE STATUS: Full code Time spent discussing advanced care planning: 16 minutes

## 2019-06-08 LAB — MISC LABCORP TEST (SEND OUT)
LabCorp test name: 183119
Labcorp test code: 183119

## 2019-06-09 ENCOUNTER — Inpatient Hospital Stay
Admission: EM | Admit: 2019-06-09 | Discharge: 2019-06-12 | DRG: 871 | Disposition: A | Discharge status: Home / Self Care | Payer: Medicaid Other | Attending: Internal Medicine | Admitting: Internal Medicine

## 2019-06-09 ENCOUNTER — Emergency Department: Payer: Medicaid Other

## 2019-06-09 ENCOUNTER — Encounter: Payer: Self-pay | Admitting: Emergency Medicine

## 2019-06-09 ENCOUNTER — Other Ambulatory Visit: Payer: Self-pay

## 2019-06-09 DIAGNOSIS — F1410 Cocaine abuse, uncomplicated: Secondary | ICD-10-CM | POA: Diagnosis present

## 2019-06-09 DIAGNOSIS — Z20828 Contact with and (suspected) exposure to other viral communicable diseases: Secondary | ICD-10-CM | POA: Diagnosis present

## 2019-06-09 DIAGNOSIS — E871 Hypo-osmolality and hyponatremia: Secondary | ICD-10-CM | POA: Diagnosis present

## 2019-06-09 DIAGNOSIS — E861 Hypovolemia: Secondary | ICD-10-CM | POA: Diagnosis present

## 2019-06-09 DIAGNOSIS — R7989 Other specified abnormal findings of blood chemistry: Secondary | ICD-10-CM | POA: Diagnosis not present

## 2019-06-09 DIAGNOSIS — A419 Sepsis, unspecified organism: Principal | ICD-10-CM | POA: Diagnosis present

## 2019-06-09 DIAGNOSIS — J9600 Acute respiratory failure, unspecified whether with hypoxia or hypercapnia: Secondary | ICD-10-CM | POA: Diagnosis present

## 2019-06-09 DIAGNOSIS — K210 Gastro-esophageal reflux disease with esophagitis: Secondary | ICD-10-CM | POA: Diagnosis present

## 2019-06-09 DIAGNOSIS — Z794 Long term (current) use of insulin: Secondary | ICD-10-CM | POA: Diagnosis not present

## 2019-06-09 DIAGNOSIS — D638 Anemia in other chronic diseases classified elsewhere: Secondary | ICD-10-CM | POA: Diagnosis present

## 2019-06-09 DIAGNOSIS — E875 Hyperkalemia: Secondary | ICD-10-CM | POA: Diagnosis present

## 2019-06-09 DIAGNOSIS — K449 Diaphragmatic hernia without obstruction or gangrene: Secondary | ICD-10-CM | POA: Diagnosis present

## 2019-06-09 DIAGNOSIS — G92 Toxic encephalopathy: Secondary | ICD-10-CM | POA: Diagnosis present

## 2019-06-09 DIAGNOSIS — R680 Hypothermia, not associated with low environmental temperature: Secondary | ICD-10-CM | POA: Diagnosis present

## 2019-06-09 DIAGNOSIS — R6521 Severe sepsis with septic shock: Secondary | ICD-10-CM | POA: Diagnosis present

## 2019-06-09 DIAGNOSIS — Z7151 Drug abuse counseling and surveillance of drug abuser: Secondary | ICD-10-CM | POA: Diagnosis not present

## 2019-06-09 DIAGNOSIS — E1011 Type 1 diabetes mellitus with ketoacidosis with coma: Secondary | ICD-10-CM | POA: Diagnosis present

## 2019-06-09 DIAGNOSIS — Z8249 Family history of ischemic heart disease and other diseases of the circulatory system: Secondary | ICD-10-CM

## 2019-06-09 DIAGNOSIS — E876 Hypokalemia: Secondary | ICD-10-CM | POA: Diagnosis present

## 2019-06-09 DIAGNOSIS — E872 Acidosis: Secondary | ICD-10-CM | POA: Diagnosis not present

## 2019-06-09 DIAGNOSIS — T68XXXA Hypothermia, initial encounter: Secondary | ICD-10-CM

## 2019-06-09 DIAGNOSIS — F17200 Nicotine dependence, unspecified, uncomplicated: Secondary | ICD-10-CM | POA: Diagnosis present

## 2019-06-09 DIAGNOSIS — N179 Acute kidney failure, unspecified: Secondary | ICD-10-CM | POA: Diagnosis present

## 2019-06-09 DIAGNOSIS — Z79899 Other long term (current) drug therapy: Secondary | ICD-10-CM | POA: Diagnosis not present

## 2019-06-09 DIAGNOSIS — Z9114 Patient's other noncompliance with medication regimen: Secondary | ICD-10-CM

## 2019-06-09 DIAGNOSIS — T405X1A Poisoning by cocaine, accidental (unintentional), initial encounter: Secondary | ICD-10-CM | POA: Diagnosis present

## 2019-06-09 DIAGNOSIS — E1040 Type 1 diabetes mellitus with diabetic neuropathy, unspecified: Secondary | ICD-10-CM | POA: Diagnosis present

## 2019-06-09 DIAGNOSIS — Y92009 Unspecified place in unspecified non-institutional (private) residence as the place of occurrence of the external cause: Secondary | ICD-10-CM

## 2019-06-09 DIAGNOSIS — I10 Essential (primary) hypertension: Secondary | ICD-10-CM | POA: Diagnosis present

## 2019-06-09 LAB — BASIC METABOLIC PANEL
BUN: 53 mg/dL — ABNORMAL HIGH (ref 6–20)
CO2: <7 mmol/L — ABNORMAL LOW (ref 22–32)
Calcium: 7.3 mg/dL — ABNORMAL LOW (ref 8.9–10.3)
Chloride: 98 mmol/L (ref 98–111)
Creatinine, Ser: 3.74 mg/dL — ABNORMAL HIGH (ref 0.61–1.24)
GFR calc Af Amer: 19 mL/min — ABNORMAL LOW (ref >60)
GFR calc non Af Amer: 17 mL/min — ABNORMAL LOW (ref >60)
Glucose, Bld: 1124 mg/dL (ref 70–99)
Potassium: 6.1 mmol/L — ABNORMAL HIGH (ref 3.5–5.1)
Sodium: 134 mmol/L — ABNORMAL LOW (ref 135–145)

## 2019-06-09 LAB — URINE DRUG SCREEN, QUALITATIVE (ARMC ONLY)
Amphetamines, Ur Screen: NOT DETECTED
Barbiturates, Ur Screen: NOT DETECTED
Benzodiazepine, Ur Scrn: NOT DETECTED
Cannabinoid 50 Ng, Ur ~~LOC~~: NOT DETECTED
Cocaine Metabolite,Ur ~~LOC~~: POSITIVE — AB
MDMA (Ecstasy)Ur Screen: NOT DETECTED
Methadone Scn, Ur: NOT DETECTED
Opiate, Ur Screen: NOT DETECTED
Phencyclidine (PCP) Ur S: NOT DETECTED
Tricyclic, Ur Screen: NOT DETECTED

## 2019-06-09 LAB — BLOOD GAS, ARTERIAL
Acid-base deficit: 18.4 mmol/L — ABNORMAL HIGH (ref 0.0–2.0)
Bicarbonate: 6.4 mmol/L — ABNORMAL LOW (ref 20.0–28.0)
FIO2: 0.21
O2 Saturation: 96.6 %
Patient temperature: 37
pCO2 arterial: <19 mmHg — CL (ref 32.0–48.0)
pH, Arterial: 7.24 — ABNORMAL LOW (ref 7.350–7.450)
pO2, Arterial: 101 mmHg (ref 83.0–108.0)

## 2019-06-09 LAB — URINALYSIS, ROUTINE W REFLEX MICROSCOPIC
Bilirubin Urine: NEGATIVE
Glucose, UA: >=500 mg/dL — AB
Ketones, ur: 80 mg/dL — AB
Leukocytes,Ua: NEGATIVE
Nitrite: NEGATIVE
Protein, ur: 30 mg/dL — AB
Specific Gravity, Urine: 1.017 (ref 1.005–1.030)
Squamous Epithelial / HPF: NONE SEEN (ref 0–5)
pH: 5 (ref 5.0–8.0)

## 2019-06-09 LAB — COMPREHENSIVE METABOLIC PANEL
ALT: 14 U/L (ref 0–44)
AST: 17 U/L (ref 15–41)
Albumin: 2.6 g/dL — ABNORMAL LOW (ref 3.5–5.0)
Alkaline Phosphatase: 96 U/L (ref 38–126)
BUN: 50 mg/dL — ABNORMAL HIGH (ref 6–20)
CO2: <7 mmol/L — ABNORMAL LOW (ref 22–32)
Calcium: 8.1 mg/dL — ABNORMAL LOW (ref 8.9–10.3)
Chloride: 94 mmol/L — ABNORMAL LOW (ref 98–111)
Creatinine, Ser: 3.82 mg/dL — ABNORMAL HIGH (ref 0.61–1.24)
GFR calc Af Amer: 19 mL/min — ABNORMAL LOW (ref >60)
GFR calc non Af Amer: 16 mL/min — ABNORMAL LOW (ref >60)
Glucose, Bld: 1228 mg/dL (ref 70–99)
Potassium: 6 mmol/L — ABNORMAL HIGH (ref 3.5–5.1)
Sodium: 132 mmol/L — ABNORMAL LOW (ref 135–145)
Total Bilirubin: 1.5 mg/dL — ABNORMAL HIGH (ref 0.3–1.2)
Total Protein: 5.8 g/dL — ABNORMAL LOW (ref 6.5–8.1)

## 2019-06-09 LAB — CBC WITH DIFFERENTIAL/PLATELET
Abs Immature Granulocytes: 2.86 10*3/uL — ABNORMAL HIGH (ref 0.00–0.07)
Basophils Absolute: 0.1 10*3/uL (ref 0.0–0.1)
Basophils Relative: 1 %
Eosinophils Absolute: 0 10*3/uL (ref 0.0–0.5)
Eosinophils Relative: 0 %
HCT: 32.8 % — ABNORMAL LOW (ref 39.0–52.0)
Hemoglobin: 8.8 g/dL — ABNORMAL LOW (ref 13.0–17.0)
Immature Granulocytes: 16 %
Lymphocytes Relative: 14 %
Lymphs Abs: 2.4 10*3/uL (ref 0.7–4.0)
MCH: 31.8 pg (ref 26.0–34.0)
MCHC: 26.8 g/dL — ABNORMAL LOW (ref 30.0–36.0)
MCV: 118.4 fL — ABNORMAL HIGH (ref 80.0–100.0)
Monocytes Absolute: 0.5 10*3/uL (ref 0.1–1.0)
Monocytes Relative: 3 %
Neutro Abs: 11.7 10*3/uL — ABNORMAL HIGH (ref 1.7–7.7)
Neutrophils Relative %: 66 %
Platelets: 790 10*3/uL — ABNORMAL HIGH (ref 150–400)
RBC: 2.77 MIL/uL — ABNORMAL LOW (ref 4.22–5.81)
RDW: 13.1 % (ref 11.5–15.5)
WBC: 17.6 10*3/uL — ABNORMAL HIGH (ref 4.0–10.5)
nRBC: 0 % (ref 0.0–0.2)

## 2019-06-09 LAB — GLUCOSE, CAPILLARY
Glucose-Capillary: >600 mg/dL (ref 70–99)
Glucose-Capillary: >600 mg/dL (ref 70–99)
Glucose-Capillary: >600 mg/dL (ref 70–99)
Glucose-Capillary: >600 mg/dL (ref 70–99)
Glucose-Capillary: >600 mg/dL (ref 70–99)

## 2019-06-09 LAB — BLOOD GAS, VENOUS
Acid-base deficit: 24.4 mmol/L — ABNORMAL HIGH (ref 0.0–2.0)
Bicarbonate: 4.2 mmol/L — ABNORMAL LOW (ref 20.0–28.0)
O2 Saturation: 73.9 %
Patient temperature: 37
pCO2, Ven: <19 mmHg — CL (ref 44.0–60.0)
pH, Ven: 7.05 — CL (ref 7.250–7.430)
pO2, Ven: 58 mmHg — ABNORMAL HIGH (ref 32.0–45.0)

## 2019-06-09 LAB — ACETAMINOPHEN LEVEL: Acetaminophen (Tylenol), Serum: <10 ug/mL — ABNORMAL LOW (ref 10–30)

## 2019-06-09 LAB — CK: Total CK: 188 U/L (ref 49–397)

## 2019-06-09 LAB — SARS CORONAVIRUS 2 BY RT PCR (HOSPITAL ORDER, PERFORMED IN ~~LOC~~ HOSPITAL LAB): SARS Coronavirus 2: NEGATIVE

## 2019-06-09 LAB — PROTIME-INR
INR: 1.5 — ABNORMAL HIGH (ref 0.8–1.2)
Prothrombin Time: 17.7 seconds — ABNORMAL HIGH (ref 11.4–15.2)

## 2019-06-09 LAB — BETA-HYDROXYBUTYRIC ACID: Beta-Hydroxybutyric Acid: >8 mmol/L — ABNORMAL HIGH (ref 0.05–0.27)

## 2019-06-09 LAB — LACTIC ACID, PLASMA
Lactic Acid, Venous: 4.8 mmol/L (ref 0.5–1.9)
Lactic Acid, Venous: 3.1 mmol/L (ref 0.5–1.9)

## 2019-06-09 LAB — SALICYLATE LEVEL: Salicylate Lvl: <7 mg/dL (ref 2.8–30.0)

## 2019-06-09 LAB — ETHANOL: Alcohol, Ethyl (B): <10 mg/dL (ref <10)

## 2019-06-09 LAB — APTT: aPTT: 42 seconds — ABNORMAL HIGH (ref 24–36)

## 2019-06-09 MED ORDER — SODIUM CHLORIDE 0.9 % IV BOLUS
1000.0000 mL | Freq: Once | INTRAVENOUS | Status: AC
  Administered 2019-06-09: 1000 mL via INTRAVENOUS

## 2019-06-09 MED ORDER — METRONIDAZOLE IN NACL 5-0.79 MG/ML-% IV SOLN
500.0000 mg | Freq: Once | INTRAVENOUS | Status: AC
  Administered 2019-06-09: 500 mg via INTRAVENOUS
  Filled 2019-06-09: qty 100

## 2019-06-09 MED ORDER — SODIUM CHLORIDE 0.9 % IV SOLN: INTRAVENOUS | Status: DC

## 2019-06-09 MED ORDER — FLUCONAZOLE IN SODIUM CHLORIDE 200-0.9 MG/100ML-% IV SOLN
200.0000 mg | INTRAVENOUS | Status: DC
  Administered 2019-06-10 – 2019-06-12 (×3): 200 mg via INTRAVENOUS
  Filled 2019-06-09 (×3): qty 100

## 2019-06-09 MED ORDER — SODIUM CHLORIDE 0.9 % IV BOLUS: 1000.0000 mL | Freq: Once | INTRAVENOUS | Status: DC

## 2019-06-09 MED ORDER — INSULIN REGULAR(HUMAN) IN NACL 100-0.9 UT/100ML-% IV SOLN
INTRAVENOUS | Status: DC
  Administered 2019-06-10: 2.4 [IU]/h via INTRAVENOUS
  Administered 2019-06-10: 10.8 [IU]/h via INTRAVENOUS
  Filled 2019-06-09 (×2): qty 100

## 2019-06-09 MED ORDER — SODIUM CHLORIDE 0.9 % IV SOLN
2.0000 g | Freq: Once | INTRAVENOUS | Status: AC
  Administered 2019-06-09: 2 g via INTRAVENOUS
  Filled 2019-06-09: qty 2

## 2019-06-09 MED ORDER — ONDANSETRON HCL 4 MG PO TABS: 4.0000 mg | ORAL_TABLET | Freq: Four times a day (QID) | ORAL | Status: DC | PRN

## 2019-06-09 MED ORDER — VANCOMYCIN HCL IN DEXTROSE 1-5 GM/200ML-% IV SOLN: 1000.0000 mg | Freq: Once | INTRAVENOUS | Status: DC

## 2019-06-09 MED ORDER — VANCOMYCIN HCL IN DEXTROSE 1-5 GM/200ML-% IV SOLN
1000.0000 mg | Freq: Once | INTRAVENOUS | Status: AC
  Administered 2019-06-09: 16:00:00 1000 mg via INTRAVENOUS
  Filled 2019-06-09: qty 200

## 2019-06-09 MED ORDER — SODIUM CHLORIDE 0.9 % IV SOLN
2.0000 g | INTRAVENOUS | Status: DC
  Administered 2019-06-10: 16:00:00 2 g via INTRAVENOUS
  Filled 2019-06-09: qty 2

## 2019-06-09 MED ORDER — INSULIN REGULAR(HUMAN) IN NACL 100-0.9 UT/100ML-% IV SOLN
INTRAVENOUS | Status: DC
  Administered 2019-06-09: 5.4 [IU]/h via INTRAVENOUS
  Filled 2019-06-09: qty 100

## 2019-06-09 MED ORDER — ONDANSETRON HCL 4 MG/2ML IJ SOLN: 4.0000 mg | Freq: Four times a day (QID) | INTRAMUSCULAR | Status: DC | PRN

## 2019-06-09 MED ORDER — STERILE WATER FOR INJECTION IV SOLN
INTRAVENOUS | Status: DC
  Administered 2019-06-09 – 2019-06-10 (×2): via INTRAVENOUS
  Filled 2019-06-09 (×4): qty 850

## 2019-06-09 MED ORDER — SODIUM BICARBONATE 8.4 % IV SOLN
50.0000 meq | Freq: Once | INTRAVENOUS | Status: AC
  Administered 2019-06-09: 16:00:00 50 meq via INTRAVENOUS

## 2019-06-09 MED ORDER — ACETAMINOPHEN 650 MG RE SUPP: 650.0000 mg | Freq: Four times a day (QID) | RECTAL | Status: DC | PRN

## 2019-06-09 MED ORDER — CHLORHEXIDINE GLUCONATE CLOTH 2 % EX PADS
6.0000 | MEDICATED_PAD | Freq: Every day | CUTANEOUS | Status: DC
  Administered 2019-06-10: 6 via TOPICAL
  Filled 2019-06-09: qty 6

## 2019-06-09 MED ORDER — SODIUM CHLORIDE 0.9 % IV SOLN: 2.0000 g | Freq: Once | INTRAVENOUS | Status: DC

## 2019-06-09 MED ORDER — VANCOMYCIN VARIABLE DOSE PER UNSTABLE RENAL FUNCTION (PHARMACIST DOSING): Status: DC

## 2019-06-09 MED ORDER — SODIUM BICARBONATE 8.4 % IV SOLN
INTRAVENOUS | Status: AC
  Administered 2019-06-09: 16:00:00 50 meq via INTRAVENOUS
  Filled 2019-06-09: qty 50

## 2019-06-09 MED ORDER — VANCOMYCIN HCL 500 MG IV SOLR
500.0000 mg | Freq: Once | INTRAVENOUS | Status: AC
  Administered 2019-06-09: 500 mg via INTRAVENOUS
  Filled 2019-06-09: qty 500

## 2019-06-09 MED ORDER — ENOXAPARIN SODIUM 40 MG/0.4ML ~~LOC~~ SOLN: 40.0000 mg | SUBCUTANEOUS | Status: DC

## 2019-06-09 MED ORDER — SODIUM CHLORIDE 0.9 % IV SOLN
INTRAVENOUS | Status: DC
  Administered 2019-06-10: 125 mL/h via INTRAVENOUS

## 2019-06-09 MED ORDER — NALOXONE HCL 2 MG/2ML IJ SOSY
0.4000 mg | PREFILLED_SYRINGE | Freq: Once | INTRAMUSCULAR | Status: AC
  Administered 2019-06-09: 0.4 mg via INTRAVENOUS

## 2019-06-09 MED ORDER — NALOXONE HCL 2 MG/2ML IJ SOSY
1.0000 mg | PREFILLED_SYRINGE | Freq: Once | INTRAMUSCULAR | Status: AC
  Administered 2019-06-09: 1 mg via INTRAVENOUS

## 2019-06-09 MED ORDER — DEXTROSE-NACL 5-0.45 % IV SOLN
INTRAVENOUS | Status: DC
  Administered 2019-06-10: 09:00:00 via INTRAVENOUS

## 2019-06-09 MED ORDER — NOREPINEPHRINE 4 MG/250ML-% IV SOLN
0.0000 ug/min | INTRAVENOUS | Status: DC
  Administered 2019-06-09: 2 ug/min via INTRAVENOUS

## 2019-06-09 MED ORDER — ACETAMINOPHEN 325 MG PO TABS: 650.0000 mg | ORAL_TABLET | Freq: Four times a day (QID) | ORAL | Status: DC | PRN

## 2019-06-09 MED ORDER — SODIUM CHLORIDE 0.9% FLUSH
3.0000 mL | Freq: Two times a day (BID) | INTRAVENOUS | Status: DC
  Administered 2019-06-10 – 2019-06-12 (×6): 3 mL via INTRAVENOUS

## 2019-06-09 MED ORDER — METRONIDAZOLE IN NACL 5-0.79 MG/ML-% IV SOLN: 500.0000 mg | Freq: Three times a day (TID) | INTRAVENOUS | Status: DC

## 2019-06-09 NOTE — ED Notes (Signed)
Bear hugger placed

## 2019-06-09 NOTE — ED Notes (Signed)
Pts insulin drip multiplier changed due to high reading x3.

## 2019-06-09 NOTE — ED Notes (Signed)
Rate of bolus changed to 228ml per [facility] NP.

## 2019-06-09 NOTE — ED Notes (Signed)
Attempting to wean pt down off of levophed. Pt decreased to 73mcg. NP [facility] aware.

## 2019-06-09 NOTE — ED Provider Notes (Signed)
[facility] Emergency Department Provider Note  ____________________________________________   First MD Initiated Contact with Patient 06/09/19 1514     (approximate)  I have reviewed the triage vital signs and the nursing notes.   HISTORY  Chief Complaint unresponsive    HPI Spencer [name] is a 58 y.o. male with history of type 1 diabetes, substance abuse who presents unresponsive.      Patient was recently discharged on 7/29 after admission for acute encephalopathy, type 1 diabetes with sepsis.  Patient was found by friend down in the house.  Unclear how long patient was on the ground for.  Noted to have bilateral dilated pupils.  Patient was hypothermic with a sugar in the 500s  Unable to get full HPI due to patient's altered mental status level 5 caveat     Past Medical History:  Diagnosis Date   Brain aneurysm    Broken bones    clavical, ankle, arms toes wriast    Diabetes mellitus without complication (HCC)    Dysrhythmia    GERD (gastroesophageal reflux disease)    Hypertension    Substance abuse ([place])     Patient Active Problem List   Diagnosis Date Noted   DKA (diabetic ketoacidoses) ([place]) 05/28/2019   AKI (acute kidney injury) ([place]) 02/19/2019   Hypoglycemia 10/23/2018   Chronic low back pain 01/16/2017   Chronic neck pain 01/16/2017   Diabetic neuropathy ([place]) 01/16/2017   DM2 (diabetes mellitus, type 2) ([place]) 01/16/2017   History of cocaine abuse ([place]) 01/16/2017   Personal history of subdural hematoma 01/16/2017   Closed displaced fracture of body of left calcaneus with delayed healing 12/13/2016   Protein-calorie malnutrition, severe 11/08/2016   DKA, type 2 ([place]) 11/06/2016   Diabetic ketoacidosis ([place]) 04/03/2015   DKA (diabetic ketoacidoses) ([place]) 04/03/2015   Hypertension 04/03/2015   Depression 04/03/2015   Opiate dependence ([place]) 04/03/2015   Tobacco abuse 04/03/2015   Lumbosacral  neuritis 07/19/2014   Pain of finger of right hand 07/19/2014   Type II or unspecified type diabetes mellitus without mention of complication, not stated as uncontrolled 07/19/2014   Knee pain 09/12/2013   Hernia of flank 09/20/2012   Epidermoid cyst of skin 09/20/2012   Chronic pain of both shoulders 03/27/2012    Past Surgical History:  Procedure Laterality Date   ESOPHAGOGASTRODUODENOSCOPY (EGD) WITH PROPOFOL N/A 06/04/2019   Procedure: ESOPHAGOGASTRODUODENOSCOPY (EGD) WITH PROPOFOL;  Surgeon: Toledo, Benay Pike, MD;  Location: [facility] ENDOSCOPY;  Service: Gastroenterology;  Laterality: N/A;   HERNIA REPAIR     IMPLANTATION / PLACEMENT OF STRIP ELECTRODES VIA BURR HOLES SUBDURAL     anyrusum   NECK SURGERY     TEE WITHOUT CARDIOVERSION N/A 06/03/2019   Procedure: TRANSESOPHAGEAL ECHOCARDIOGRAM (TEE);  Surgeon: Teodoro Spray, MD;  Location: [facility] ORS;  Service: Cardiovascular;  Laterality: N/A;    Prior to Admission medications   Medication Sig Start Date End Date Taking? Authorizing Provider  amLODipine (NORVASC) 10 MG tablet Take 1 tablet (10 mg total) by mouth daily. 01/19/19   Bettey Costa, MD  atorvastatin (LIPITOR) 10 MG tablet Take 1 tablet (10 mg total) by mouth daily. 01/08/19   Mayo, Pete Pelt, MD  blood glucose meter kit and supplies Check blood sugar 4 times daily 01/08/19   Mayo, Pete Pelt, MD  cyclobenzaprine (FLEXERIL) 10 MG tablet Take 1 tablet (10 mg total) by mouth 3 (three) times daily as needed. 02/13/18   Sable Feil, PA-C  fluconazole (  DIFLUCAN) 200 MG tablet Take 1 tablet (200 mg total) by mouth daily for 9 days. 06/05/19 06/14/19  Saundra Shelling, MD  folic acid (FOLVITE) 1 MG tablet Take 1 tablet (1 mg total) by mouth daily. 06/05/19 07/05/19  Saundra Shelling, MD  gabapentin (NEURONTIN) 300 MG capsule Take 2 capsules (600 mg total) by mouth 3 (three) times daily. 01/08/19   Mayo, Pete Pelt, MD  insulin aspart (NOVOLOG) 100 UNIT/ML FlexPen Inject 5 Units into the  skin 3 (three) times daily with meals for 30 days. 01/08/19 02/18/19  Mayo, Pete Pelt, MD  insulin aspart (NOVOLOG) 100 UNIT/ML FlexPen Inject 6 Units into the skin 3 (three) times daily with meals for 30 days. 02/19/19 03/21/19  Bettey Costa, MD  insulin aspart (NOVOLOG) 100 UNIT/ML injection Inject 1 Units into the skin 3 (three) times daily before meals. Sliding scale    [provider]  insulin glargine (LANTUS) 100 UNIT/ML injection Inject 40 Units into the skin daily.    [provider]  insulin glargine (LANTUS) 100 unit/mL SOPN Inject 0.24 mLs (24 Units total) into the skin daily. 02/13/19   Demetrios Loll, MD  Insulin Pen Needle 31G X 5 MM MISC Check blood sugars 4 times daily 01/08/19   Mayo, Pete Pelt, MD  nicotine (NICODERM CQ - DOSED IN MG/24 HOURS) 14 mg/24hr patch Place 1 patch (14 mg total) onto the skin daily. 02/13/19   Demetrios Loll, MD  pantoprazole (PROTONIX) 40 MG tablet Take 1 tablet (40 mg total) by mouth daily. 06/05/19 07/05/19  Saundra Shelling, MD  thiamine 100 MG tablet Take 1 tablet (100 mg total) by mouth daily for 15 days. 06/05/19 06/20/19  Saundra Shelling, MD    Allergies Patient has no known allergies.  Family History  Problem Relation Age of Onset   CAD Sister    Hypertension Sister    Healthy Mother    Healthy Father     Social History Social History   Tobacco Use   Smoking status: Current Some Day Smoker   Smokeless tobacco: Never Used  Substance Use Topics   Alcohol use: Yes    Alcohol/week: 1.0 standard drinks    Types: 1 Cans of beer per week   Drug use: Not Currently    Types: Cocaine      Review of Systems Unable to get review of system due to patient's altered mental status ____________________________________________   PHYSICAL EXAM:  VITAL SIGNS: ED Triage Vitals  Enc Vitals Group     BP 06/09/19 1458 (!) 62/53     Pulse Rate 06/09/19 1451 92     Resp 06/09/19 1451 14     Temp 06/09/19 1458 (!) 88.5 F (31.4 C)      Temp Source 06/09/19 1458 Rectal     SpO2 06/09/19 1458 100 %     Weight 06/09/19 1456 140 lb (63.5 kg)     Height 06/09/19 1456 _0  (1.676 m)     Head Circumference --      Peak Flow --      Pain Score --      Pain Loc --      Pain Edu? --      Excl. in [place]? --     Constitutional: Altered, occasionally spontaneously moving his arms and legs Eyes: Pupils 5 unreactive bilaterally Head: Atraumatic. Nose: No congestion/rhinnorhea. Mouth/Throat: Mucous membranes are moist.  Possible dried blood on the mouth Neck: No stridor. Trachea Midline. FROM Cardiovascular: Tachycardic grossly normal heart sounds.  Good peripheral circulation. Respiratory: Normal respiratory effort with bagging.  No retractions. Lungs CTAB. Gastrointestinal: Soft and nontender. No distention. No abdominal bruits.  Musculoskeletal: No lower extremity tenderness nor edema.  No joint effusions. Neurologic: Occasionally moving both sides arms and legs spontaneously, withdraws to pain, GCS of 8 Skin:  Skin is warm, dry and intact. No rash noted. Psychiatric: Unable to fully assess due to altered mental status GU: Deferred   ____________________________________________   LABS (all labs ordered are listed, but only abnormal results are displayed)  Labs Reviewed  GLUCOSE, CAPILLARY - Abnormal; Notable for the following components:      Result Value   Glucose-Capillary >600 (*)    All other components within normal limits  BLOOD GAS, VENOUS - Abnormal; Notable for the following components:   pH, Ven <6.900 (*)    pCO2, Ven <19.0 (*)    pO2, Ven 62.0 (*)    All other components within normal limits  CBC WITH DIFFERENTIAL/PLATELET - Abnormal; Notable for the following components:   WBC 17.6 (*)    RBC 2.77 (*)    Hemoglobin 8.8 (*)    HCT 32.8 (*)    MCV 118.4 (*)    MCHC 26.8 (*)    Platelets 790 (*)    Neutro Abs 11.7 (*)    Abs Immature Granulocytes 2.86 (*)    All other components within normal limits    COMPREHENSIVE METABOLIC PANEL - Abnormal; Notable for the following components:   Sodium 132 (*)    Potassium 6.0 (*)    Chloride 94 (*)    CO2 <7 (*)    Glucose, Bld 1,228 (*)    BUN 50 (*)    Creatinine, Ser 3.82 (*)    Calcium 8.1 (*)    Total Protein 5.8 (*)    Albumin 2.6 (*)    Total Bilirubin 1.5 (*)    GFR calc non Af Amer 16 (*)    GFR calc Af Amer 19 (*)    All other components within normal limits  PROTIME-INR - Abnormal; Notable for the following components:   Prothrombin Time 17.7 (*)    INR 1.5 (*)    All other components within normal limits  APTT - Abnormal; Notable for the following components:   aPTT 42 (*)    All other components within normal limits  URINE DRUG SCREEN, QUALITATIVE ([facility] ONLY) - Abnormal; Notable for the following components:   Cocaine Metabolite,Ur [place] POSITIVE (*)    All other components within normal limits  URINALYSIS, ROUTINE W REFLEX MICROSCOPIC - Abnormal; Notable for the following components:   Color, Urine YELLOW (*)    APPearance HAZY (*)    Glucose, UA >=500 (*)    Hgb urine dipstick SMALL (*)    Ketones, ur 80 (*)    Protein, ur 30 (*)    Bacteria, UA RARE (*)    All other components within normal limits  ACETAMINOPHEN LEVEL - Abnormal; Notable for the following components:   Acetaminophen (Tylenol), Serum <10 (*)    All other components within normal limits  BETA-HYDROXYBUTYRIC ACID - Abnormal; Notable for the following components:   Beta-Hydroxybutyric Acid >8.00 (*)    All other components within normal limits  LACTIC ACID, PLASMA - Abnormal; Notable for the following components:   Lactic Acid, Venous 4.8 (*)    All other components within normal limits  LACTIC ACID, PLASMA - Abnormal; Notable for the following components:   Lactic Acid, Venous 3.1 (*)  All other components within normal limits  GLUCOSE, CAPILLARY - Abnormal; Notable for the following components:   Glucose-Capillary >600 (*)    All other components  within normal limits  SARS CORONAVIRUS 2 (HOSPITAL ORDER, [facility])  CULTURE, BLOOD (ROUTINE X 2)  CULTURE, BLOOD (ROUTINE X 2)  URINE CULTURE  ETHANOL  SALICYLATE LEVEL  CK  PATHOLOGIST SMEAR REVIEW  BASIC METABOLIC PANEL   ____________________________________________   ED ECG REPORT I, Vanessa [place], the attending physician, personally viewed and interpreted this ECG.  EKG sinus tachycardia rate of 98, no ST elevation, does have some flipped T waves intermittent conduction delays, QTC of 495 ____________________________________________  RADIOLOGY I, Vanessa [place], personally viewed and evaluated these images (plain radiographs) as part of my medical decision making, as well as reviewing the written report by the radiologist.  ED MD interpretation: Chest x-ray without evidence of pneumonia  Official radiology report(s): Ct Head Wo Contrast  Result Date: 06/09/2019 CLINICAL DATA:  Altered mental status, unknown down time EXAM: CT HEAD WITHOUT CONTRAST CT CERVICAL SPINE WITHOUT CONTRAST TECHNIQUE: Multidetector CT imaging of the head and cervical spine was performed following the standard protocol without intravenous contrast. Multiplanar CT image reconstructions of the cervical spine were also generated. COMPARISON:  CT 10/24/2019, 07/04/2019 FINDINGS: CT HEAD FINDINGS Brain: No acute territorial infarction, hemorrhage, or intracranial mass. Chronic lacunar infarct left basal ganglia. Mild atrophy. Mild small vessel ischemic changes of the white matter. Vascular: No hyperdense vessels.  Carotid vascular calcification Skull: Left frontal and posterior parietal burr holes.  No fracture Sinuses/Orbits: Fluid level right maxillary sinus. Patchy mucosal thickening in the ethmoid sinuses Other: None CT CERVICAL SPINE FINDINGS Alignment: Straightening of the cervical spine. Facet alignment within normal limits. Skull base and vertebrae: No acute fracture. No primary  bone lesion or focal pathologic process. Soft tissues and spinal canal: No prevertebral fluid or swelling. No visible canal hematoma. Disc levels: Post fusion changes C5 through C7. Mild degenerative change C4-C5 and C7-T1. Prominent degenerative change at the C1-C2 articulation. Upper chest: Negative. Other: None IMPRESSION: 1. No CT evidence for acute intracranial abnormality. Atrophy and mild small vessel ischemic changes of the white matter. 2. Straightening of the cervical spine with fusion hardware C5 through C7. No acute osseous abnormality. Electronically Signed   By: Donavan Foil M.D.   On: 06/09/2019 15:25   Ct Cervical Spine Wo Contrast  Result Date: 06/09/2019 CLINICAL DATA:  Altered mental status, unknown down time EXAM: CT HEAD WITHOUT CONTRAST CT CERVICAL SPINE WITHOUT CONTRAST TECHNIQUE: Multidetector CT imaging of the head and cervical spine was performed following the standard protocol without intravenous contrast. Multiplanar CT image reconstructions of the cervical spine were also generated. COMPARISON:  CT 10/24/2019, 07/04/2019 FINDINGS: CT HEAD FINDINGS Brain: No acute territorial infarction, hemorrhage, or intracranial mass. Chronic lacunar infarct left basal ganglia. Mild atrophy. Mild small vessel ischemic changes of the white matter. Vascular: No hyperdense vessels.  Carotid vascular calcification Skull: Left frontal and posterior parietal burr holes.  No fracture Sinuses/Orbits: Fluid level right maxillary sinus. Patchy mucosal thickening in the ethmoid sinuses Other: None CT CERVICAL SPINE FINDINGS Alignment: Straightening of the cervical spine. Facet alignment within normal limits. Skull base and vertebrae: No acute fracture. No primary bone lesion or focal pathologic process. Soft tissues and spinal canal: No prevertebral fluid or swelling. No visible canal hematoma. Disc levels: Post fusion changes C5 through C7. Mild degenerative change C4-C5 and C7-T1. Prominent degenerative  change  at the C1-C2 articulation. Upper chest: Negative. Other: None IMPRESSION: 1. No CT evidence for acute intracranial abnormality. Atrophy and mild small vessel ischemic changes of the white matter. 2. Straightening of the cervical spine with fusion hardware C5 through C7. No acute osseous abnormality. Electronically Signed   By: Donavan Foil M.D.   On: 06/09/2019 15:25   Dg Chest Portable 1 View  Result Date: 06/09/2019 CLINICAL DATA:  Mental status. EXAM: PORTABLE CHEST 1 VIEW COMPARISON:  02/18/2019. FINDINGS: Mediastinum hilar structures normal. Lungs are clear of acute infiltrates. Mild cardiomegaly. No pulmonary venous congestion. Metallic fragments again noted right chest. Prominent gastric distention. Cervical spine fusion. IMPRESSION: 1. Prominent gastric distention. 2. Borderline cardiomegaly. No pulmonary venous congestion. No focal pulmonary infiltrates. Electronically Signed   By: Marcello Moores  Register   On: 06/09/2019 15:12    ____________________________________________   PROCEDURES  Procedure(s) performed (including Critical Care):  .Critical Care Performed by: Vanessa [place], MD Authorized by: Vanessa [place], MD   Critical care provider statement:    Critical care time (minutes):  60   Critical care was necessary to treat or prevent imminent or life-threatening deterioration of the following conditions:  Circulatory failure and sepsis   Critical care was time spent personally by me on the following activities:  Discussions with consultants, evaluation of patient's response to treatment, examination of patient, ordering and performing treatments and interventions, ordering and review of laboratory studies, ordering and review of radiographic studies, pulse oximetry, re-evaluation of patient's condition, obtaining history from patient or surrogate and review of old charts     ____________________________________________   INITIAL IMPRESSION / ASSESSMENT AND PLAN / ED  COURSE  Garald Braver was evaluated in Emergency Department on 06/09/2019 for the symptoms described in the history of present illness. He was evaluated in the context of the global COVID-19 pandemic, which necessitated consideration that the patient might be at risk for infection with the SARS-CoV-2 virus that causes COVID-19. Institutional protocols and algorithms that pertain to the evaluation of patients at risk for COVID-19 are in a state of rapid change based on information released by regulatory bodies including the CDC and federal and state organizations. These policies and algorithms were followed during the patient's care in the ED.     Patient presents with altered mental status with elevated sugar and dilated pupils.  Patient also noted to be very hypothermic.  Will get CT head evaluate for bleed, labs to evaluate for DKA and labs to evaluate for sepsis or bacteremia.  Will cover with antibiotics.  Consider drug use that is also complicating picture.  unclear patient's downtime.   3:19 PM patient given 0.4 Narcan which seemed to somewhat help his respirations.  Patient is now breathing on his own without assistance from BVM.  Patient was given another 1 mg but his mental status stayed about the same.  Patient had a chest x-ray that was negative for pneumothorax.  Patient was immediately taken to the CT scanner given concern for head bleed.  CT head was negative.  Patient sugar noted to be over 600 and therefore I am concerned that this is secondary to DKA given he had a recent similar admission for this.  We will also get blood cultures and cover patient for sepsis.  Patient is currently on his 3 L of fluid.  We will start a Levophed drip for continued hypotension.  Temp  Foley was placed and patient was started on [employer] given patient is hypothermic  3:36 PM pt given 1 amp bicarb given presumed acidosis and started on levophed drip for hypotension.  4:00 PM patient's bicarb is less  than 6.9.  Will start patient on bicarb drip.  Awaiting potassium level to start insulin.  Patient is started on his fourth liter of fluid.  Patient is not requiring any oxygen and has not made good urine output therefore suspect that he is severely dehydrated.  Levophed on 5   5:54 PM finally resulted at 6.0.  Patient started on IV insulin.  Discussed with the hospital team [facility] NP.  Patient admitted for altered mental status secondary to sepsis, DKA   ____________________________________________   FINAL CLINICAL IMPRESSION(S) / ED DIAGNOSES   Final diagnoses:  Diabetic ketoacidosis with coma associated with type 1 diabetes mellitus ([place])  Sepsis, due to unspecified organism, unspecified whether acute organ dysfunction present [facility])      MEDICATIONS GIVEN DURING THIS VISIT:  Medications  norepinephrine (LEVOPHED) 44m in 257mpremix infusion (3 mcg/min Intravenous Rate/Dose Change 06/09/19 1851)  sodium bicarbonate 150 mEq in sterile water 1,000 mL infusion ( Intravenous New Bag/Given 06/09/19 1654)  insulin regular, human (MYXREDLIN) 100 units/ 100 mL infusion (5.4 Units/hr Intravenous New Bag/Given 06/09/19 1813)  naloxone ([facility] 0.4 mg (0.4 mg Intravenous Given 06/09/19 1446)  naloxone (NARCAN) injection 1 mg (1 mg Intravenous Given 06/09/19 1452)  sodium chloride 0.9 % bolus 1,000 mL (0 mLs Intravenous Stopped 06/09/19 1552)  sodium chloride 0.9 % bolus 1,000 mL (0 mLs Intravenous Stopped 06/09/19 1628)  sodium chloride 0.9 % bolus 1,000 mL (0 mLs Intravenous Stopped 06/09/19 1653)  ceFEPIme (MAXIPIME) 2 g in sodium chloride 0.9 % 100 mL IVPB (0 g Intravenous Stopped 06/09/19 1559)  metroNIDAZOLE (FLAGYL) IVPB 500 mg (0 mg Intravenous Stopped 06/09/19 1655)  vancomycin (VANCOCIN) IVPB 1000 mg/200 mL premix (0 mg Intravenous Stopped 06/09/19 1718)  sodium bicarbonate injection 50 mEq (50 mEq Intravenous Given 06/09/19 1533)  sodium chloride 0.9 % bolus 1,000 mL (1,000 mLs Intravenous  New Bag/Given 06/09/19 1730)     ED Discharge Orders    None       Note:  This document was prepared using Dragon voice recognition software and may include unintentional dictation errors.   FuVanessa DurhamMD 06/09/19 1901

## 2019-06-09 NOTE — ED Triage Notes (Signed)
Pt arrives via ems. Very little information obtained by ems. Pt arrives being bagged and is unresponsive at this time. Unsure of events leading up to collapse/being unresponsive. CBG in the 500s obtained from EMS

## 2019-06-09 NOTE — Progress Notes (Signed)
CODE SEPSIS - PHARMACY COMMUNICATION  **Broad Spectrum Antibiotics should be administered within 1 hour of Sepsis diagnosis**  Time Code Sepsis Called/Page Received: 8/3 1528  Antibiotics Ordered: Cefepime/Vanc  Time of 1st antibiotic administration: 1545  Additional action taken by pharmacy:    If necessary, Name of Provider/Nurse Contacted:      Noralee Space ,PharmD Clinical Pharmacist  06/09/2019  3:58 PM

## 2019-06-09 NOTE — Progress Notes (Signed)
PHARMACY -  BRIEF ANTIBIOTIC NOTE   Pharmacy has received consult(s) for Cefepime and Vancomycin from an ED provider.  The patient's profile has been reviewed for ht/wt/allergies/indication/available labs.    One time order(s) placed for Cefepime and Vancomycin  Further antibiotics/pharmacy consults should be ordered by admitting physician if indicated.                       Thank you, Spencer [name] A 06/09/2019  3:27 PM

## 2019-06-09 NOTE — ED Notes (Signed)
Heart rate has been increasing. Repeat EKG obtained and Dr Burlene Arnt signed as Sinus Tach. Dr Nickolas Madrid also aware. Admitting MD paged to see pt. New order received for NS bolus.

## 2019-06-09 NOTE — Progress Notes (Signed)
eLink Physician-Brief Progress Note Patient Name: Spencer [name] DOB: September 04, 1961 MRN: [id number]   Date of Service  06/09/2019  HPI/Events of Note  A 58 year old male with a history of polysubstance abuse and type 1 diabetes presented to the ER with cocaine overdose and diabetic ketoacidosis.   eICU Interventions  Received 4 L of IV fluids.  Started on DKA protocol and bicarbonate drip.  Trend lactic acid, potassium, bicarbonate and glucose.  On empiric antibiotics.  Notified nurse practitioner Spencer [name]     Intervention Category Major Interventions: Delirium, psychosis, severe agitation - evaluation and management;Change in mental status - evaluation and management Intermediate Interventions: Communication with other healthcare providers and/or family Evaluation Type: New Patient Evaluation  Spencer [name] 06/09/2019, 10:13 PM

## 2019-06-09 NOTE — H&P (Signed)
[facility] [place] NAME: Spencer [name]    MR#:  [id number]  DATE OF BIRTH:  [birth year]  DATE OF ADMISSION:  06/09/2019  PRIMARY CARE PHYSICIAN: Jodi Marble, MD   REQUESTING/REFERRING PHYSICIAN: Marjean Donna, MD  CHIEF COMPLAINT:   Chief Complaint  Patient presents with   unresponsive    HISTORY OF PRESENT ILLNESS:  Spencer [name]  is a 58 y.o. male with a known history of brain aneurysm, diabetes mellitus, dysrhythmia, GERD, hypertension, and substance abuse.  He was recently admitted to the ICU for diabetic ketoacidosis with discharge on 06/04/2019.  During hospitalization, he received broad-spectrum antibiotics with vancomycin and cefepime as well as insulin drip for diabetic ketoacidosis along with bicarbonate.  Blood cultures grew Candida albicans and patient received IV fluconazole.  Transesophageal echocardiogram was negative for growth.  Endoscopy was completed by gastroenterology demonstrating esophagitis and hiatal hernia.  Patient was found unresponsive in his home by family earlier in the day on 06/09/2019 brought to the emergency room via EMS services with bag mask ventilation upon arrival to the emergency room.  Patient received Narcan with return of spontaneous respirations.  He was hypothermic on arrival with temperature 88.5 Fahrenheit.  Glucose on arrival is 1124.  Patient is awake however speech continues to be unintelligible and patient is not following commands.  CT brain was negative.  Urine drug screen was positive for cocaine metabolites.  Rapid COVID-19 testing is negative.  Lactic acid was 4.8 on arrival.   Initial arterial blood gas demonstrated pH of 6.9 with undetectable bicarbonate.  Patient was started on bicarbonate infusion with 3 Amps of bicarbonate.  He received 4 L normal saline upon arrival in the emergency room.  He has been started on antibiotic broad-spectrum therapy for sepsis of unknown source as well as insulin  infusion.  Patient required vasopressor therapy with Levophed initially however this is being weaned as patient tolerates.  He has been tachycardic since arrival with heart rate 1 20-1 60.  Patient has been admitted to the hospital by hospitalist group with transfer of care to the ICU intensivist.  PAST MEDICAL HISTORY:   Past Medical History:  Diagnosis Date   Brain aneurysm    Broken bones    clavical, ankle, arms toes wriast    Diabetes mellitus without complication ([place])    Dysrhythmia    GERD (gastroesophageal reflux disease)    Hypertension    Substance abuse ([place])     PAST SURGICAL HISTORY:   Past Surgical History:  Procedure Laterality Date   ESOPHAGOGASTRODUODENOSCOPY (EGD) WITH PROPOFOL N/A 06/04/2019   Procedure: ESOPHAGOGASTRODUODENOSCOPY (EGD) WITH PROPOFOL;  Surgeon: Toledo, Benay Pike, MD;  Location: [facility] ENDOSCOPY;  Service: Gastroenterology;  Laterality: N/A;   HERNIA REPAIR     IMPLANTATION / PLACEMENT OF STRIP ELECTRODES VIA BURR HOLES SUBDURAL     anyrusum   NECK SURGERY     TEE WITHOUT CARDIOVERSION N/A 06/03/2019   Procedure: TRANSESOPHAGEAL ECHOCARDIOGRAM (TEE);  Surgeon: Teodoro Spray, MD;  Location: [facility] ORS;  Service: Cardiovascular;  Laterality: N/A;    SOCIAL HISTORY:   Social History   Tobacco Use   Smoking status: Current Some Day Smoker   Smokeless tobacco: Never Used  Substance Use Topics   Alcohol use: Yes    Alcohol/week: 1.0 standard drinks    Types: 1 Cans of beer per week    FAMILY HISTORY:   Family History  Problem Relation Age of Onset  CAD Sister    Hypertension Sister    Healthy Mother    Healthy Father     DRUG ALLERGIES:  No Known Allergies  REVIEW OF SYSTEMS:   ROS Unable to be obtained due to patient condition   MEDICATIONS AT HOME:   Prior to Admission medications   Medication Sig Start Date End Date Taking? Authorizing Provider  insulin glargine (LANTUS) 100 unit/mL SOPN Inject 0.24  mLs (24 Units total) into the skin daily. 02/13/19  Yes Demetrios Loll, MD  amLODipine (NORVASC) 10 MG tablet Take 1 tablet (10 mg total) by mouth daily. 01/19/19   Bettey Costa, MD  atorvastatin (LIPITOR) 10 MG tablet Take 1 tablet (10 mg total) by mouth daily. 01/08/19   Mayo, Pete Pelt, MD  blood glucose meter kit and supplies Check blood sugar 4 times daily 01/08/19   Mayo, Pete Pelt, MD  cyclobenzaprine (FLEXERIL) 10 MG tablet Take 1 tablet (10 mg total) by mouth 3 (three) times daily as needed. 02/13/18   Sable Feil, PA-C  fluconazole (DIFLUCAN) 200 MG tablet Take 1 tablet (200 mg total) by mouth daily for 9 days. 06/05/19 06/14/19  Saundra Shelling, MD  folic acid (FOLVITE) 1 MG tablet Take 1 tablet (1 mg total) by mouth daily. 06/05/19 07/05/19  Saundra Shelling, MD  gabapentin (NEURONTIN) 300 MG capsule Take 2 capsules (600 mg total) by mouth 3 (three) times daily. 01/08/19   Mayo, Pete Pelt, MD  insulin aspart (NOVOLOG) 100 UNIT/ML FlexPen Inject 5 Units into the skin 3 (three) times daily with meals for 30 days. 01/08/19 02/18/19  Mayo, Pete Pelt, MD  insulin aspart (NOVOLOG) 100 UNIT/ML FlexPen Inject 6 Units into the skin 3 (three) times daily with meals for 30 days. 02/19/19 03/21/19  Bettey Costa, MD  insulin aspart (NOVOLOG) 100 UNIT/ML injection Inject 1 Units into the skin 3 (three) times daily before meals. Sliding scale    [provider]  insulin glargine (LANTUS) 100 UNIT/ML injection Inject 40 Units into the skin daily.    [provider]  Insulin Pen Needle 31G X 5 MM MISC Check blood sugars 4 times daily 01/08/19   Mayo, Pete Pelt, MD  nicotine (NICODERM CQ - DOSED IN MG/24 HOURS) 14 mg/24hr patch Place 1 patch (14 mg total) onto the skin daily. 02/13/19   Demetrios Loll, MD  pantoprazole (PROTONIX) 40 MG tablet Take 1 tablet (40 mg total) by mouth daily. 06/05/19 07/05/19  Saundra Shelling, MD  thiamine 100 MG tablet Take 1 tablet (100 mg total) by mouth daily for 15 days. 06/05/19 06/20/19   Saundra Shelling, MD      VITAL SIGNS:  Blood pressure 132/65, pulse (!) 107, temperature (!) 89.9 F (32.2 C), resp. rate 20, height 5' 6"  (1.676 m), weight 63.5 kg, SpO2 100 %.  PHYSICAL EXAMINATION:  Physical Exam  GENERAL:  58 y.o.-year-old ill-appearing patient lying in the bed  EYES: Pupils equal, round, reactive to light.  No scleral icterus. Extraocular muscles intact.  HEENT: Head atraumatic, normocephalic. Oropharynx and nasopharynx clear. Appearance of dried blood on his lips NECK:  Supple, no jugular venous distention. No thyroid enlargement, no tenderness.  LUNGS: Normal breath sounds bilaterally, no wheezing, rales,rhonchi or crepitation. No use of accessory muscles of respiration.  CARDIOVASCULAR: tachycardia, S1, S2 normal. No murmurs, rubs, or gallops.  ABDOMEN: Soft, nondistended, nontender. Bowel sounds present. No organomegaly or mass.  EXTREMITIES: No pedal edema, cyanosis, or clubbing.  NEUROLOGIC: awake, moving all extremities, GCS 6 PSYCHIATRIC:  Altered, unintelligible speech.  SKIN: No obvious rash, lesion, or ulcer.   LABORATORY PANEL:   CBC Recent Labs  Lab 06/09/19 1604  WBC 17.6*  HGB 8.8*  HCT 32.8*  PLT 790*   ------------------------------------------------------------------------------------------------------------------  Chemistries  Recent Labs  Lab 06/03/19 0219  06/09/19 1604  NA 140   < > 132*  K 4.1   < > 6.0*  CL 112*   < > 94*  CO2 21*   < > <7*  GLUCOSE 292*   < > 1,228*  BUN 25*   < > 50*  CREATININE 1.81*   < > 3.82*  CALCIUM 8.1*   < > 8.1*  MG 2.3  --   --   AST  --   --  17  ALT  --   --  14  ALKPHOS  --   --  96  BILITOT  --   --  1.5*   < > = values in this interval not displayed.   ------------------------------------------------------------------------------------------------------------------  Cardiac Enzymes No results for input(s): TROPONINI in the last 168  hours. ------------------------------------------------------------------------------------------------------------------  RADIOLOGY:  Ct Head Wo Contrast  Result Date: 06/09/2019 CLINICAL DATA:  Altered mental status, unknown down time EXAM: CT HEAD WITHOUT CONTRAST CT CERVICAL SPINE WITHOUT CONTRAST TECHNIQUE: Multidetector CT imaging of the head and cervical spine was performed following the standard protocol without intravenous contrast. Multiplanar CT image reconstructions of the cervical spine were also generated. COMPARISON:  CT 10/24/2019, 07/04/2019 FINDINGS: CT HEAD FINDINGS Brain: No acute territorial infarction, hemorrhage, or intracranial mass. Chronic lacunar infarct left basal ganglia. Mild atrophy. Mild small vessel ischemic changes of the white matter. Vascular: No hyperdense vessels.  Carotid vascular calcification Skull: Left frontal and posterior parietal burr holes.  No fracture Sinuses/Orbits: Fluid level right maxillary sinus. Patchy mucosal thickening in the ethmoid sinuses Other: None CT CERVICAL SPINE FINDINGS Alignment: Straightening of the cervical spine. Facet alignment within normal limits. Skull base and vertebrae: No acute fracture. No primary bone lesion or focal pathologic process. Soft tissues and spinal canal: No prevertebral fluid or swelling. No visible canal hematoma. Disc levels: Post fusion changes C5 through C7. Mild degenerative change C4-C5 and C7-T1. Prominent degenerative change at the C1-C2 articulation. Upper chest: Negative. Other: None IMPRESSION: 1. No CT evidence for acute intracranial abnormality. Atrophy and mild small vessel ischemic changes of the white matter. 2. Straightening of the cervical spine with fusion hardware C5 through C7. No acute osseous abnormality. Electronically Signed   By: Donavan Foil M.D.   On: 06/09/2019 15:25   Ct Cervical Spine Wo Contrast  Result Date: 06/09/2019 CLINICAL DATA:  Altered mental status, unknown down time EXAM: CT  HEAD WITHOUT CONTRAST CT CERVICAL SPINE WITHOUT CONTRAST TECHNIQUE: Multidetector CT imaging of the head and cervical spine was performed following the standard protocol without intravenous contrast. Multiplanar CT image reconstructions of the cervical spine were also generated. COMPARISON:  CT 10/24/2019, 07/04/2019 FINDINGS: CT HEAD FINDINGS Brain: No acute territorial infarction, hemorrhage, or intracranial mass. Chronic lacunar infarct left basal ganglia. Mild atrophy. Mild small vessel ischemic changes of the white matter. Vascular: No hyperdense vessels.  Carotid vascular calcification Skull: Left frontal and posterior parietal burr holes.  No fracture Sinuses/Orbits: Fluid level right maxillary sinus. Patchy mucosal thickening in the ethmoid sinuses Other: None CT CERVICAL SPINE FINDINGS Alignment: Straightening of the cervical spine. Facet alignment within normal limits. Skull base and vertebrae: No acute fracture. No primary bone lesion or focal pathologic process. Soft tissues  and spinal canal: No prevertebral fluid or swelling. No visible canal hematoma. Disc levels: Post fusion changes C5 through C7. Mild degenerative change C4-C5 and C7-T1. Prominent degenerative change at the C1-C2 articulation. Upper chest: Negative. Other: None IMPRESSION: 1. No CT evidence for acute intracranial abnormality. Atrophy and mild small vessel ischemic changes of the white matter. 2. Straightening of the cervical spine with fusion hardware C5 through C7. No acute osseous abnormality. Electronically Signed   By: Donavan Foil M.D.   On: 06/09/2019 15:25   Dg Chest Portable 1 View  Result Date: 06/09/2019 CLINICAL DATA:  Mental status. EXAM: PORTABLE CHEST 1 VIEW COMPARISON:  02/18/2019. FINDINGS: Mediastinum hilar structures normal. Lungs are clear of acute infiltrates. Mild cardiomegaly. No pulmonary venous congestion. Metallic fragments again noted right chest. Prominent gastric distention. Cervical spine fusion.  IMPRESSION: 1. Prominent gastric distention. 2. Borderline cardiomegaly. No pulmonary venous congestion. No focal pulmonary infiltrates. Electronically Signed   By: Marcello Moores  Register   On: 06/09/2019 15:12      IMPRESSION AND PLAN:   1.  Severe sepsis - Patient received 4 L normal saline bolus currently with normal saline infusing to peripheral IV at 200 cc/h and bicarbonate infusion with 3 A of bicarbonate infusing at 100 cc/h -Telemetry monitoring - Blood and urine cultures are pending -Will discontinue bicarbonate infusion when pH is greater than 7.1. -Broad-spectrum IV antibiotic therapy with vancomycin, cefepime, and Flagyl. - Vasopressor therapy is being weaned as patient tolerates -Repeat CBC and BMP in the a.m. -Patient is hypothermic and therefore has bear hugger in place  2.  DKA - Patient received fluid bolus as above with bicarbonate infusion - Insulin infusion per DKA protocol with serial labs per protocol  3.  Altered mental status - Likely secondary to DKA and/or sepsis  4. history of substance abuse with urine drug screen positive for cocaine metabolites - We will consult social service for possible rehabilitation assistance  5. hyperkalemia - Severe metabolic acidosis with renal failure -Patient is on bicarbonate drip -We will repeat BMP and continue to monitor potassium levels closely per DKA protocol  DVT and PPI prophylaxis initiated  Patient admitted to the ICU by hospitalist group with transfer of care to the ICU intensivist.  All the records are reviewed and case discussed with ED provider. The plan of care was discussed in details with the patient (and family). I answered all questions. The patient agreed to proceed with the above mentioned plan. Further management will depend upon hospital course.   CODE STATUS: Full code  TOTAL TIME TAKING CARE OF THIS PATIENT: 45 minutes.    Laurie on 06/09/2019 at 7:26 PM  Pager -  [phone]  After 6pm go to [email or web address] - Proofreader  [facility]  Office  [phone]  CC: Primary care physician; Jodi Marble, MD   Note: This dictation was prepared with Dragon dictation along with smaller phrase technology. Any transcriptional errors that result from this process are unintentional.

## 2019-06-09 NOTE — Progress Notes (Addendum)
Pharmacy Antibiotic Note  Spencer [name] is a 58 y.o. male admitted on 06/09/2019 with sepsis.  Pharmacy has been consulted for Vancomycin, Cefepime dosing.  Plan: Cefepime 2 gm IV X 1 given in ED on 8/3 @ 1545. Cefepime 2 gm IV Q24H ordered to continue on 8/4 @ 1600.   Vancomycin 1 gm IV X 1 given in ED on 8/3 @ 1619. Additional Vancomycin 500 mg IV X 1 ordered to be given to make total loading dose of 1500 mg. This pt presents today with SrCr of ~ 3.8 , which is twice his baseline SrCr of 1.9.  Since his renal function is unstable will dose by levels.   BMP ordered for 8/4 @ 0500.  Random vanc level ordered for 8/4 @ 1600.   Height: 5\' 6"  (167.6 cm) Weight: 140 lb (63.5 kg) IBW/kg (Calculated) : 63.8  Temp (24hrs), Avg:89.9 F (32.2 C), Min:87 F (30.6 C), Max:93.9 F (34.4 C)  Recent Labs  Lab 06/03/19 0219 06/04/19 0238 06/09/19 1519 06/09/19 1604 06/09/19 1818  WBC  --  6.1  --  17.6*  --   CREATININE 1.81* 1.93*  --  3.82* 3.74*  LATICACIDVEN  --   --  4.8*  --  3.1*    Estimated Creatinine Clearance: 18.9 mL/min (A) (by C-G formula based on SCr of 3.74 mg/dL (H)).    No Known Allergies  Antimicrobials this admission:   >>    >>   Dose adjustments this admission:   Microbiology results:  BCx:   UCx:    Sputum:    MRSA PCR:   Thank you for allowing pharmacy to be a part of this patient's care.  Zeniya Lapidus D 06/09/2019 9:51 PM

## 2019-06-09 NOTE — ED Notes (Signed)
Chaplain in with pt.  

## 2019-06-09 NOTE — Consult Note (Addendum)
° °Name: Spencer [name] °MRN: [id number] °DOB: 12/02/1960   ° °ADMISSION DATE:  06/09/2019 °CONSULTATION DATE: 06/09/2019 ° °REFERRING MD : Angela Seals, NP ° °CHIEF COMPLAINT: Unresponsiveness ° °BRIEF PATIENT DESCRIPTION: °58 yo male admitted with suspected sepsis with hypothermia, acute encephalopathy, acute respiratory failure secondary to metabolic acidosis, and DKA requiring insulin gtt  ° °SIGNIFICANT EVENTS/STUDIES:  °08/3-Pt admitted to ICU on insulin gtt and with suspected sepsis  °08/3-CT Head/Cervical Spine revealed no CT evidence for acute intracranial abnormality. Atrophy and mild small vessel ischemic changes of the white matter. °Straightening of the cervical spine with fusion hardware C5 through C7. No acute osseous abnormality. °  °HISTORY OF PRESENT ILLNESS:   °This is a 58 yo male with a PMH of Substance Abuse, HTN, GERD, Type I Diabetes Mellitus, DKA (requiring frequent hospitalizations due to medication noncompliance) and Brain Aneurysm.  He presented to [facility] ER on 08/3 via EMS unresponsive with CBG's in the 500's.  Per ER notes pt was found by a friend on the ground at his home (uncertain how long the pt was down). Upon arrival to the ER pt required bag mask ventilation by EMS. Pt received a total of 1.4 mg of narcan per ER physician orders, post administration pt did not require bag mask ventilation and was placed on nasal canula O2 sats improved to 90's-100%.Pt also noted to have bilateral dilated pupils and hypothermia (88.5 F via rectal temp and hypotensive bp 62/53). Lab results revealed Na+ 132, K+ 6.0, chloride 94, CO2 <7, glucose 1,228, BUN 50, creatinine 3.82, wbc 18.6, hgb 8.8, lactic acid 4.8, acetaminophen level <10, salicylate <7.0, urine drug screen +cocaine, COVID-19 negative, and UA revealed >=500 glucose/rare bacteria.  CXR concerning for cardiomegaly and gastric distension. Sepsis protocol initiated pt received cefepime, flagyl, vancomycin, and 4L NS bolus.  Lab results ruled pt in  for DKA, therefore pt received 1 amp sodium bicarb and sodium bicarb/insulin gtt initiated.  Pt remained hypotensive requiring levophed gtt.  He was subsequently admitted to ICU per hospitalist team for additional workup and treatment. ° °Pt recently discharged from [facility] on 07/30 following treatment of DKA, GI Bleed (EGD revealed LA Grade A esophagitis and 2 cm hiatal hernia, examination otherwise normal), sepsis-TEE negative, blood cultures positive for candida infection (pt instructed to continue 200 mg po fluconazole daily for 9 days per ID/follow-up with ID in the outpatient setting).  ° °PAST MEDICAL HISTORY :  ° has a past medical history of Brain aneurysm, Broken bones, Diabetes mellitus without complication (HCC), Dysrhythmia, GERD (gastroesophageal reflux disease), Hypertension, and Substance abuse (HCC). ° has a past surgical history that includes Neck surgery; Hernia repair; TEE without cardioversion (N/A, 06/03/2019); Implantation / placement of strip electrodes via burr holes subdural; and Esophagogastroduodenoscopy (egd) with propofol (N/A, 06/04/2019). °Prior to Admission medications   °Medication Sig Start Date End Date Taking? Authorizing Provider  °insulin glargine (LANTUS) 100 unit/mL SOPN Inject 0.24 mLs (24 Units total) into the skin daily. 02/13/19  Yes Chen, Qing, MD  °amLODipine (NORVASC) 10 MG tablet Take 1 tablet (10 mg total) by mouth daily. 01/19/19   Mody, Sital, MD  °atorvastatin (LIPITOR) 10 MG tablet Take 1 tablet (10 mg total) by mouth daily. 01/08/19   Mayo, Katy Dodd, MD  °blood glucose meter kit and supplies Check blood sugar 4 times daily 01/08/19   Mayo, Katy Dodd, MD  °cyclobenzaprine (FLEXERIL) 10 MG tablet Take 1 tablet (10 mg total) by mouth 3 (three) times daily as needed. 02/13/18     Sable Feil, PA-C  fluconazole (DIFLUCAN) 200 MG tablet Take 1 tablet (200 mg total) by mouth daily for 9 days. 06/05/19 06/14/19  Saundra Shelling, MD  folic acid (FOLVITE) 1 MG tablet Take 1 tablet (1  mg total) by mouth daily. 06/05/19 07/05/19  Saundra Shelling, MD  gabapentin (NEURONTIN) 300 MG capsule Take 2 capsules (600 mg total) by mouth 3 (three) times daily. 01/08/19   Mayo, Pete Pelt, MD  insulin aspart (NOVOLOG) 100 UNIT/ML FlexPen Inject 5 Units into the skin 3 (three) times daily with meals for 30 days. 01/08/19 02/18/19  Mayo, Pete Pelt, MD  insulin aspart (NOVOLOG) 100 UNIT/ML FlexPen Inject 6 Units into the skin 3 (three) times daily with meals for 30 days. 02/19/19 03/21/19  Bettey Costa, MD  insulin aspart (NOVOLOG) 100 UNIT/ML injection Inject 1 Units into the skin 3 (three) times daily before meals. Sliding scale    [provider]  insulin glargine (LANTUS) 100 UNIT/ML injection Inject 40 Units into the skin daily.    [provider]  Insulin Pen Needle 31G X 5 MM MISC Check blood sugars 4 times daily 01/08/19   Mayo, Pete Pelt, MD  nicotine (NICODERM CQ - DOSED IN MG/24 HOURS) 14 mg/24hr patch Place 1 patch (14 mg total) onto the skin daily. 02/13/19   Demetrios Loll, MD  pantoprazole (PROTONIX) 40 MG tablet Take 1 tablet (40 mg total) by mouth daily. 06/05/19 07/05/19  Saundra Shelling, MD  thiamine 100 MG tablet Take 1 tablet (100 mg total) by mouth daily for 15 days. 06/05/19 06/20/19  Saundra Shelling, MD   No Known Allergies  FAMILY HISTORY:  family history includes CAD in his sister; Healthy in his father and mother; Hypertension in his sister. SOCIAL HISTORY:  reports that he has been smoking. He has never used smokeless tobacco. He reports current alcohol use of about 1.0 standard drinks of alcohol per week. He reports previous drug use. Drug: Cocaine.  REVIEW OF SYSTEMS:   Unable to assess pt confused   SUBJECTIVE:  Unable to assess pt confused   VITAL SIGNS: Temp:  [87 F (30.6 C)-92.7 F (33.7 C)] 92.7 F (33.7 C) (08/03 2048) Pulse Rate:  [77-138] 135 (08/03 2048) Resp:  [12-31] 20 (08/03 2048) BP: (62-140)/(41-97) 123/80 (08/03 2048) SpO2:  [88 %-100 %] 100  % (08/03 2048) Weight:  [63.5 kg] 63.5 kg (08/03 1456)  PHYSICAL EXAMINATION: General: acutely ill appearing male, NAD  Neuro: confused, follows commands, PERRL  HEENT: supple, no JVD, dried blood around pts mouth   Cardiovascular: nsr, rrr, no R/G  Lungs: faint rhonchi throughout, even, non labored  Abdomen: +BS x4, soft, non tender, non distended  Musculoskeletal: normal tone, trace bilateral lower extremity edema Skin: intact no rashes or lesions present   Recent Labs  Lab 06/03/19 0219 06/04/19 0238 06/09/19 1604  NA 140 137 132*  K 4.1 4.1 6.0*  CL 112* 110 94*  CO2 21* 21* <7*  BUN 25* 27* 50*  CREATININE 1.81* 1.93* 3.82*  GLUCOSE 292* 383* 1,228*   Recent Labs  Lab 06/04/19 0238 06/04/19 1050 06/09/19 1604  HGB 7.9* 8.0* 8.8*  HCT 23.8* 23.9* 32.8*  WBC 6.1  --  17.6*  PLT 224  --  790*   Ct Head Wo Contrast  Result Date: 06/09/2019 CLINICAL DATA:  Altered mental status, unknown down time EXAM: CT HEAD WITHOUT CONTRAST CT CERVICAL SPINE WITHOUT CONTRAST TECHNIQUE: Multidetector CT imaging of the head and cervical spine was performed  following the standard protocol without intravenous contrast. Multiplanar CT image reconstructions of the cervical spine were also generated. COMPARISON:  CT 10/24/2019, 07/04/2019 FINDINGS: CT HEAD FINDINGS Brain: No acute territorial infarction, hemorrhage, or intracranial mass. Chronic lacunar infarct left basal ganglia. Mild atrophy. Mild small vessel ischemic changes of the white matter. Vascular: No hyperdense vessels.  Carotid vascular calcification Skull: Left frontal and posterior parietal burr holes.  No fracture Sinuses/Orbits: Fluid level right maxillary sinus. Patchy mucosal thickening in the ethmoid sinuses Other: None CT CERVICAL SPINE FINDINGS Alignment: Straightening of the cervical spine. Facet alignment within normal limits. Skull base and vertebrae: No acute fracture. No primary bone lesion or focal pathologic process. Soft  tissues and spinal canal: No prevertebral fluid or swelling. No visible canal hematoma. Disc levels: Post fusion changes C5 through C7. Mild degenerative change C4-C5 and C7-T1. Prominent degenerative change at the C1-C2 articulation. Upper chest: Negative. Other: None IMPRESSION: 1. No CT evidence for acute intracranial abnormality. Atrophy and mild small vessel ischemic changes of the white matter. 2. Straightening of the cervical spine with fusion hardware C5 through C7. No acute osseous abnormality. Electronically Signed   By: Donavan Foil M.D.   On: 06/09/2019 15:25   Ct Cervical Spine Wo Contrast  Result Date: 06/09/2019 CLINICAL DATA:  Altered mental status, unknown down time EXAM: CT HEAD WITHOUT CONTRAST CT CERVICAL SPINE WITHOUT CONTRAST TECHNIQUE: Multidetector CT imaging of the head and cervical spine was performed following the standard protocol without intravenous contrast. Multiplanar CT image reconstructions of the cervical spine were also generated. COMPARISON:  CT 10/24/2019, 07/04/2019 FINDINGS: CT HEAD FINDINGS Brain: No acute territorial infarction, hemorrhage, or intracranial mass. Chronic lacunar infarct left basal ganglia. Mild atrophy. Mild small vessel ischemic changes of the white matter. Vascular: No hyperdense vessels.  Carotid vascular calcification Skull: Left frontal and posterior parietal burr holes.  No fracture Sinuses/Orbits: Fluid level right maxillary sinus. Patchy mucosal thickening in the ethmoid sinuses Other: None CT CERVICAL SPINE FINDINGS Alignment: Straightening of the cervical spine. Facet alignment within normal limits. Skull base and vertebrae: No acute fracture. No primary bone lesion or focal pathologic process. Soft tissues and spinal canal: No prevertebral fluid or swelling. No visible canal hematoma. Disc levels: Post fusion changes C5 through C7. Mild degenerative change C4-C5 and C7-T1. Prominent degenerative change at the C1-C2 articulation. Upper chest:  Negative. Other: None IMPRESSION: 1. No CT evidence for acute intracranial abnormality. Atrophy and mild small vessel ischemic changes of the white matter. 2. Straightening of the cervical spine with fusion hardware C5 through C7. No acute osseous abnormality. Electronically Signed   By: Donavan Foil M.D.   On: 06/09/2019 15:25   Dg Chest Portable 1 View  Result Date: 06/09/2019 CLINICAL DATA:  Mental status. EXAM: PORTABLE CHEST 1 VIEW COMPARISON:  02/18/2019. FINDINGS: Mediastinum hilar structures normal. Lungs are clear of acute infiltrates. Mild cardiomegaly. No pulmonary venous congestion. Metallic fragments again noted right chest. Prominent gastric distention. Cervical spine fusion. IMPRESSION: 1. Prominent gastric distention. 2. Borderline cardiomegaly. No pulmonary venous congestion. No focal pulmonary infiltrates. Electronically Signed   By: Marcello Moores  Register   On: 06/09/2019 15:12    ASSESSMENT / PLAN:  Acute respiratory failure secondary to metabolic acidosis in setting DKA  Supplemental O2 for dyspnea and/or hypoxia  Prn bronchodilator therapy   Sinus tachycardia suspected secondary to DKA and sepsis  Hypotension-improving  Hx: HTN and Dysrhythmia Continuous telemetry monitoring  Trend troponin's  If pt becomes hypotensive will start neo-synephrine gtt  Hold outpatient amlodipine  Restart outpatient atorvastatin once pt able to tolerate po's   Acute renal failure secondary to hypovolemia in setting of DKA  Lactic acidosis  Hyponatremia  Hyperkalemia  Trend BMP and lactic acid  Replace electrolytes as indicated  Monitor UOP  Avoid nephrotoxic medications  Continue sodium bicarb gtt _0  ml/hr for now   Leukocytosis and severe hypothermia concerning for possible sepsis although etiology unclear  (Previous admission 05/28/2019 blood cultures positive for candida infection at discharge 06/05/2019 pt instructed to continue 200 mg po fluconazole daily for 9 days per ID, however  due to acute encephalopathy uncertain of medication compliance) Trend WBC and monitor fever curve  Follow cultures  Continue vancomycin, cefepime, and diflucan for now  Prn bear hugger for hypothermia   Anemia without obvious acute blood loss VTE px: subq heparin for now  Trend CBC  Monitor for s/sx of bleeding and transfuse for hgb <7   Diabetic ketoacidosis secondary to medication noncompliance  CBG's q1hr and BMP q4hrs while on insulin gtt  Continue iv fluids per DKA protocol  Diabetes coordinator consulted appreciate input   Acute encephalopathy secondary to DKA, cocaine use, and possible sepsis  Continue insulin gtt  Will need polysubstance abuse cessation counseling once mentation improves CIWA protocol initiated  Marda Stalker, [place] Pager [phone] (please enter 7 digits) PCCM Consult Pager [phone] (please enter 7 digits)

## 2019-06-09 NOTE — ED Notes (Signed)
ED TO INPATIENT HANDOFF REPORT  ED Nurse Name and Phone #:  Toma Copier 41  S Name/Age/Gender Spencer [name] 58 y.o. male Room/Bed: ED19A/ED19A  Code Status   Code Status: Full Code  Home/SNF/Other Home Patient oriented to: self Is this baseline? No   Triage Complete: Triage complete  Chief Complaint unresponsive  Triage Note Pt arrives via ems. Very little information obtained by ems. Pt arrives being bagged and is unresponsive at this time. Unsure of events leading up to collapse/being unresponsive. CBG in the 500s obtained from EMS   Allergies No Known Allergies  Level of Care/Admitting Diagnosis ED Disposition    ED Disposition Condition Comment   Admit  Hospital Area: [facility] [[id number]]  Level of Care: ICU [6]  Covid Evaluation: Confirmed COVID Negative  Diagnosis: Sepsis [facility]) [[id number]]  Admitting Physician: Pearletha Alfred [[id number]]  Attending Physician: Pearletha Alfred [[id number]]  Estimated length of stay: 3 - 4 days  Certification:: I certify this patient will need inpatient services for at least 2 midnights  PT Class (Do Not Modify): Inpatient [101]  PT Acc Code (Do Not Modify): Private [1]       B Medical/Surgery History Past Medical History:  Diagnosis Date  . Brain aneurysm   . Broken bones    clavical, ankle, arms toes wriast   . Diabetes mellitus without complication (HCC)   . Dysrhythmia   . GERD (gastroesophageal reflux disease)   . Hypertension   . Substance abuse [facility])    Past Surgical History:  Procedure Laterality Date  . ESOPHAGOGASTRODUODENOSCOPY (EGD) WITH PROPOFOL N/A 06/04/2019   Procedure: ESOPHAGOGASTRODUODENOSCOPY (EGD) WITH PROPOFOL;  Surgeon: Toledo, Boykin Nearing, MD;  Location: [facility] ENDOSCOPY;  Service: Gastroenterology;  Laterality: N/A;  . HERNIA REPAIR    . IMPLANTATION / PLACEMENT OF STRIP ELECTRODES VIA BURR HOLES SUBDURAL     anyrusum  . NECK SURGERY    . TEE WITHOUT CARDIOVERSION N/A 06/03/2019    Procedure: TRANSESOPHAGEAL ECHOCARDIOGRAM (TEE);  Surgeon: Dalia Heading, MD;  Location: [facility] ORS;  Service: Cardiovascular;  Laterality: N/A;     A IV Location/Drains/Wounds Patient Lines/Drains/Airways Status   Active Line/Drains/Airways    Name:   Placement date:   Placement time:   Site:   Days:   Peripheral IV 06/09/19 Right Arm   06/09/19    1450    Arm   less than 1   Peripheral IV 06/09/19 Left Wrist   06/09/19    1458    Wrist   less than 1   Peripheral IV 06/09/19 Left Antecubital   06/09/19    1530    Antecubital   less than 1   Peripheral IV 06/09/19 Right Antecubital   06/09/19    1616    Antecubital   less than 1   Urethral Catheter Vet, RN Temperature probe   06/09/19    1550    Temperature probe   less than 1          Intake/Output Last 24 hours  Intake/Output Summary (Last 24 hours) at 06/09/2019 2137 Last data filed at 06/09/2019 2104 Gross per 24 hour  Intake -  Output 350 ml  Net -350 ml    Labs/Imaging Results for orders placed or performed during the hospital encounter of 06/09/19 (from the past 48 hour(s))  Glucose, capillary     Status: Abnormal   Collection Time: 06/09/19  2:51 PM  Result Value Ref Range   Glucose-Capillary >600 (HH) 70 -  99 mg/dL   Comment 1 Notify RN    Comment 2 Document in Chart   Blood gas, venous     Status: Abnormal (Preliminary result)   Collection Time: 06/09/19  2:53 PM  Result Value Ref Range   pH, Ven <6.900 (LL) 7.250 - 7.430    Comment: CRITICAL RESULT CALLED TO, READ BACK BY AND VERIFIED WITH: CRITICAL VALUE 06/09/19/1555 DR. FUNKE/FD    pCO2, Ven <19.0 (LL) 44.0 - 60.0 mmHg    Comment: CRITICAL RESULT CALLED TO, READ BACK BY AND VERIFIED WITH: CRITICAL VALUE 06/09/19,1555 DR. FUNKE/FD    pO2, Ven 62.0 (H) 32.0 - 45.0 mmHg   Bicarbonate PENDING 20.0 - 28.0 mmol/L   O2 Saturation PENDING %   Patient temperature 37.0    Collection site VEIN    Sample type VEIN     Comment: Performed at [facility], [address] [place], [place] [address]  Urine Drug Screen, Qualitative ([place] only)     Status: Abnormal   Collection Time: 06/09/19  2:53 PM  Result Value Ref Range   Tricyclic, Ur Screen NONE DETECTED NONE DETECTED   Amphetamines, Ur Screen NONE DETECTED NONE DETECTED   MDMA (Ecstasy)Ur Screen NONE DETECTED NONE DETECTED   Cocaine Metabolite,Ur [place] POSITIVE (A) NONE DETECTED   Opiate, Ur Screen NONE DETECTED NONE DETECTED   Phencyclidine (PCP) Ur S NONE DETECTED NONE DETECTED   Cannabinoid 50 Ng, Ur [place] NONE DETECTED NONE DETECTED   Barbiturates, Ur Screen NONE DETECTED NONE DETECTED   Benzodiazepine, Ur Scrn NONE DETECTED NONE DETECTED   Methadone Scn, Ur NONE DETECTED NONE DETECTED    Comment: (NOTE) Tricyclics + metabolites, urine    Cutoff 1000 ng/mL Amphetamines + metabolites, urine  Cutoff 1000 ng/mL MDMA (Ecstasy), urine              Cutoff 500 ng/mL Cocaine Metabolite, urine          Cutoff 300 ng/mL Opiate + metabolites, urine        Cutoff 300 ng/mL Phencyclidine (PCP), urine         Cutoff 25 ng/mL Cannabinoid, urine                 Cutoff 50 ng/mL Barbiturates + metabolites, urine  Cutoff 200 ng/mL Benzodiazepine, urine              Cutoff 200 ng/mL Methadone, urine                   Cutoff 300 ng/mL The urine drug screen provides only a preliminary, unconfirmed analytical test result and should not be used for non-medical purposes. Clinical consideration and professional judgment should be applied to any positive drug screen result due to possible interfering substances. A more specific alternate chemical method must be used in order to obtain a confirmed analytical result. Gas chromatography / mass spectrometry (GC/MS) is the preferred confirmat ory method. Performed at [facility], [address] [place], [place] [address]   Urinalysis, Routine w reflex microscopic     Status: Abnormal   Collection Time: 06/09/19  2:53 PM  Result Value Ref Range    Color, Urine YELLOW (A) YELLOW   APPearance HAZY (A) CLEAR   Specific Gravity, Urine 1.017 1.005 - 1.030   pH 5.0 5.0 - 8.0   Glucose, UA >=500 (A) NEGATIVE mg/dL   Hgb urine dipstick SMALL (A) NEGATIVE   Bilirubin Urine NEGATIVE NEGATIVE   Ketones, ur 80 (A) NEGATIVE mg/dL  Protein, ur 30 (A) NEGATIVE mg/dL   Nitrite NEGATIVE NEGATIVE   Leukocytes,Ua NEGATIVE NEGATIVE   RBC / HPF 0-5 0 - 5 RBC/hpf   WBC, UA 0-5 0 - 5 WBC/hpf   Bacteria, UA RARE (A) NONE SEEN   Squamous Epithelial / LPF NONE SEEN 0 - 5    Comment: Performed at [facility], [address] [place], [place] [address]  CK     Status: None   Collection Time: 06/09/19  3:18 PM  Result Value Ref Range   Total CK 188 49 - 397 U/L    Comment: Performed at [facility], [address] [place], [place] [address]  Lactic acid, plasma     Status: Abnormal   Collection Time: 06/09/19  3:19 PM  Result Value Ref Range   Lactic Acid, Venous 4.8 (HH) 0.5 - 1.9 mmol/L    Comment: CRITICAL RESULT CALLED TO, READ BACK BY AND VERIFIED WITH KAILEY WALKER AT 1620 ON 06/09/2019 [facility]. Performed at [facility], [address] [place], [place] [address]   SARS Coronavirus 2 [facility] order, Performed in [facility] hospital lab) Nasopharyngeal Nasopharyngeal Swab     Status: None   Collection Time: 06/09/19  3:19 PM   Specimen: Nasopharyngeal Swab  Result Value Ref Range   SARS Coronavirus 2 NEGATIVE NEGATIVE    Comment: (NOTE) If result is NEGATIVE SARS-CoV-2 target nucleic acids are NOT DETECTED. The SARS-CoV-2 RNA is generally detectable in upper and lower  respiratory specimens during the acute phase of infection. The lowest  concentration of SARS-CoV-2 viral copies this assay can detect is 250  copies / mL. A negative result does not preclude SARS-CoV-2 infection  and should not be used as the sole basis for treatment or other  patient management decisions.  A negative result may occur with   improper specimen collection / handling, submission of specimen other  than nasopharyngeal swab, presence of viral mutation(s) within the  areas targeted by this assay, and inadequate number of viral copies  (<250 copies / mL). A negative result must be combined with clinical  observations, patient history, and epidemiological information. If result is POSITIVE SARS-CoV-2 target nucleic acids are DETECTED. The SARS-CoV-2 RNA is generally detectable in upper and lower  respiratory specimens dur ing the acute phase of infection.  Positive  results are indicative of active infection with SARS-CoV-2.  Clinical  correlation with patient history and other diagnostic information is  necessary to determine patient infection status.  Positive results do  not rule out bacterial infection or co-infection with other viruses. If result is PRESUMPTIVE POSTIVE SARS-CoV-2 nucleic acids MAY BE PRESENT.   A presumptive positive result was obtained on the submitted specimen  and confirmed on repeat testing.  While 2019 novel coronavirus  (SARS-CoV-2) nucleic acids may be present in the submitted sample  additional confirmatory testing may be necessary for epidemiological  and / or clinical management purposes  to differentiate between  SARS-CoV-2 and other Sarbecovirus currently known to infect humans.  If clinically indicated additional testing with an alternate test  methodology [phone] is advised. The SARS-CoV-2 RNA is generally  detectable in upper and lower respiratory sp ecimens during the acute  phase of infection. The expected result is Negative. Fact Sheet for Patients:  BoilerBrush.com.cy Fact Sheet for Healthcare Providers: https://pope.com/ This test is not yet approved or cleared by the [place] FDA and has been authorized for detection and/or diagnosis of SARS-CoV-2 by FDA under an Emergency  Use Authorization (EUA).  This EUA will  remain in effect (meaning this test can be used) for the duration of the COVID-19 declaration under Section 564(b)(1) of the Act, 21 U.S.C. section 360bbb-3(b)(1), unless the authorization is terminated or revoked sooner. Performed at [facility], [address] [place], [place] [address]   CBC with Differential     Status: Abnormal   Collection Time: 06/09/19  4:04 PM  Result Value Ref Range   WBC 17.6 (H) 4.0 - 10.5 K/uL   RBC 2.77 (L) 4.22 - 5.81 MIL/uL   Hemoglobin 8.8 (L) 13.0 - 17.0 g/dL   HCT 46.9 (L) 62.9 - 52.8 %   MCV 118.4 (H) 80.0 - 100.0 fL   MCH 31.8 26.0 - 34.0 pg   MCHC 26.8 (L) 30.0 - 36.0 g/dL   RDW 41.3 24.4 - 01.0 %   Platelets 790 (H) 150 - 400 K/uL   nRBC 0.0 0.0 - 0.2 %   Neutrophils Relative % 66 %   Neutro Abs 11.7 (H) 1.7 - 7.7 K/uL   Lymphocytes Relative 14 %   Lymphs Abs 2.4 0.7 - 4.0 K/uL   Monocytes Relative 3 %   Monocytes Absolute 0.5 0.1 - 1.0 K/uL   Eosinophils Relative 0 %   Eosinophils Absolute 0.0 0.0 - 0.5 K/uL   Basophils Relative 1 %   Basophils Absolute 0.1 0.0 - 0.1 K/uL   Immature Granulocytes 16 %   Abs Immature Granulocytes 2.86 (H) 0.00 - 0.07 K/uL   Polychromasia PRESENT    Ovalocytes PRESENT     Comment: Performed at [facility], [address] [place], [place] [address]  Comprehensive metabolic panel     Status: Abnormal   Collection Time: 06/09/19  4:04 PM  Result Value Ref Range   Sodium 132 (L) 135 - 145 mmol/L   Potassium 6.0 (H) 3.5 - 5.1 mmol/L   Chloride 94 (L) 98 - 111 mmol/L   CO2 <7 (L) 22 - 32 mmol/L   Glucose, Bld 1,228 (HH) 70 - 99 mg/dL    Comment: CRITICAL RESULT CALLED TO, READ BACK BY AND VERIFIED WITH KAILEY WALKER AT 1717 ON 06/09/2019 [facility].    BUN 50 (H) 6 - 20 mg/dL   Creatinine, Ser 6.64 (H) 0.61 - 1.24 mg/dL   Calcium 8.1 (L) 8.9 - 10.3 mg/dL   Total Protein 5.8 (L) 6.5 - 8.1 g/dL   Albumin 2.6 (L) 3.5 - 5.0 g/dL   AST 17 15 - 41 U/L   ALT 14 0 - 44 U/L   Alkaline  Phosphatase 96 38 - 126 U/L   Total Bilirubin 1.5 (H) 0.3 - 1.2 mg/dL   GFR calc non Af Amer 16 (L) >60 mL/min   GFR calc Af Amer 19 (L) >60 mL/min   Anion gap NOT CALCULATED 5 - 15    Comment: Performed at [facility], [address] [place], [place] [address]  Protime-INR     Status: Abnormal   Collection Time: 06/09/19  4:04 PM  Result Value Ref Range   Prothrombin Time 17.7 (H) 11.4 - 15.2 seconds   INR 1.5 (H) 0.8 - 1.2    Comment: (NOTE) INR goal varies based on device and disease states. Performed at [facility], [address] [place], [place] [address]   APTT     Status: Abnormal   Collection Time: 06/09/19  4:04 PM  Result Value Ref Range   aPTT 42 (H) 24 - 36  seconds    Comment:        IF BASELINE aPTT IS ELEVATED, SUGGEST PATIENT RISK ASSESSMENT BE USED TO DETERMINE APPROPRIATE ANTICOAGULANT THERAPY. Performed at [facility], [address] [place], [place] [address]   Ethanol     Status: None   Collection Time: 06/09/19  4:04 PM  Result Value Ref Range   Alcohol, Ethyl (B) <10 <10 mg/dL    Comment: (NOTE) Lowest detectable limit for serum alcohol is 10 mg/dL. For medical purposes only. Performed at [facility], [address] [place], [place] [address]   Acetaminophen level     Status: Abnormal   Collection Time: 06/09/19  4:04 PM  Result Value Ref Range   Acetaminophen (Tylenol), Serum <10 (L) 10 - 30 ug/mL    Comment: (NOTE) Therapeutic concentrations vary significantly. A range of 10-30 ug/mL  may be an effective concentration for many patients. However, some  are best treated at concentrations outside of this range. Acetaminophen concentrations >150 ug/mL at 4 hours after ingestion  and >50 ug/mL at 12 hours after ingestion are often associated with  toxic reactions. Performed at [facility], [address] [place], [place] [address]   Salicylate level     Status: None   Collection Time:  06/09/19  4:04 PM  Result Value Ref Range   Salicylate Lvl <7.0 2.8 - 30.0 mg/dL    Comment: Performed at [facility], [address] [place], [place] [address]  Beta-hydroxybutyric acid     Status: Abnormal   Collection Time: 06/09/19  4:04 PM  Result Value Ref Range   Beta-Hydroxybutyric Acid >8.00 (H) 0.05 - 0.27 mmol/L    Comment: Performed at [facility], [address] [place], [place] [address]  Glucose, capillary     Status: Abnormal   Collection Time: 06/09/19  6:10 PM  Result Value Ref Range   Glucose-Capillary >600 (HH) 70 - 99 mg/dL  Lactic acid, plasma     Status: Abnormal   Collection Time: 06/09/19  6:18 PM  Result Value Ref Range   Lactic Acid, Venous 3.1 (HH) 0.5 - 1.9 mmol/L    Comment: CRITICAL RESULT CALLED TO, READ BACK BY AND VERIFIED WITH Ailene ArdsKAILEY WALKER RN AT 1839 ON 06/09/2019 [facility] Performed at [facility], [address] [place], [place] [address]   Basic metabolic panel     Status: Abnormal   Collection Time: 06/09/19  6:18 PM  Result Value Ref Range   Sodium 134 (L) 135 - 145 mmol/L   Potassium 6.1 (H) 3.5 - 5.1 mmol/L   Chloride 98 98 - 111 mmol/L   CO2 <7 (L) 22 - 32 mmol/L   Glucose, Bld 1,124 (HH) 70 - 99 mg/dL    Comment: CRITICAL RESULT CALLED TO, READ BACK BY AND VERIFIED WITH Zacherie Honeyman MEDEZ RN AT 2116 06/09/2019 SN[facility]    BUN 53 (H) 6 - 20 mg/dL   Creatinine, Ser 5.783.74 (H) 0.61 - 1.24 mg/dL   Calcium 7.3 (L) 8.9 - 10.3 mg/dL   GFR calc non Af Amer 17 (L) >60 mL/min   GFR calc Af Amer 19 (L) >60 mL/min   Anion gap NOT CALCULATED 5 - 15    Comment: Performed at [facility], [address] [place], [place] [address]  Glucose, capillary     Status: Abnormal   Collection Time: 06/09/19  7:22 PM  Result Value Ref Range   Glucose-Capillary >600 (HH) 70 - 99 mg/dL  Blood  gas, venous     Status: Abnormal   Collection Time: 06/09/19  8:59 PM  Result Value Ref Range   pH, Ven 7.05 (LL) 7.250 - 7.430     Comment: CRITICAL RESULT CALLED TO, READ BACK BY AND VERIFIED WITH: Hinda KehrSHERRY ,Dorn Hartshorne RN AT 2127 ON 0803/2020 BUY SMATHEW RRT    pCO2, Ven <19.0 (LL) 44.0 - 60.0 mmHg   pO2, Ven 58.0 (H) 32.0 - 45.0 mmHg   Bicarbonate 4.2 (L) 20.0 - 28.0 mmol/L   Acid-base deficit 24.4 (H) 0.0 - 2.0 mmol/L   O2 Saturation 73.9 %   Patient temperature 37.0    Collection site VENOUS    Sample type VENOUS     Comment: Performed at [facility], [address] [place], [place] [address]  Glucose, capillary     Status: Abnormal   Collection Time: 06/09/19  9:00 PM  Result Value Ref Range   Glucose-Capillary >600 (HH) 70 - 99 mg/dL   Ct Head Wo Contrast  Result Date: 06/09/2019 CLINICAL DATA:  Altered mental status, unknown down time EXAM: CT HEAD WITHOUT CONTRAST CT CERVICAL SPINE WITHOUT CONTRAST TECHNIQUE: Multidetector CT imaging of the head and cervical spine was performed following the standard protocol without intravenous contrast. Multiplanar CT image reconstructions of the cervical spine were also generated. COMPARISON:  CT 10/24/2019, 07/04/2019 FINDINGS: CT HEAD FINDINGS Brain: No acute territorial infarction, hemorrhage, or intracranial mass. Chronic lacunar infarct left basal ganglia. Mild atrophy. Mild small vessel ischemic changes of the white matter. Vascular: No hyperdense vessels.  Carotid vascular calcification Skull: Left frontal and posterior parietal burr holes.  No fracture Sinuses/Orbits: Fluid level right maxillary sinus. Patchy mucosal thickening in the ethmoid sinuses Other: None CT CERVICAL SPINE FINDINGS Alignment: Straightening of the cervical spine. Facet alignment within normal limits. Skull base and vertebrae: No acute fracture. No primary bone lesion or focal pathologic process. Soft tissues and spinal canal: No prevertebral fluid or swelling. No visible canal hematoma. Disc levels: Post fusion changes C5 through C7. Mild degenerative change C4-C5 and C7-T1. Prominent  degenerative change at the C1-C2 articulation. Upper chest: Negative. Other: None IMPRESSION: 1. No CT evidence for acute intracranial abnormality. Atrophy and mild small vessel ischemic changes of the white matter. 2. Straightening of the cervical spine with fusion hardware C5 through C7. No acute osseous abnormality. Electronically Signed   By: Jasmine PangKim  Fujinaga M.D.   On: 06/09/2019 15:25   Ct Cervical Spine Wo Contrast  Result Date: 06/09/2019 CLINICAL DATA:  Altered mental status, unknown down time EXAM: CT HEAD WITHOUT CONTRAST CT CERVICAL SPINE WITHOUT CONTRAST TECHNIQUE: Multidetector CT imaging of the head and cervical spine was performed following the standard protocol without intravenous contrast. Multiplanar CT image reconstructions of the cervical spine were also generated. COMPARISON:  CT 10/24/2019, 07/04/2019 FINDINGS: CT HEAD FINDINGS Brain: No acute territorial infarction, hemorrhage, or intracranial mass. Chronic lacunar infarct left basal ganglia. Mild atrophy. Mild small vessel ischemic changes of the white matter. Vascular: No hyperdense vessels.  Carotid vascular calcification Skull: Left frontal and posterior parietal burr holes.  No fracture Sinuses/Orbits: Fluid level right maxillary sinus. Patchy mucosal thickening in the ethmoid sinuses Other: None CT CERVICAL SPINE FINDINGS Alignment: Straightening of the cervical spine. Facet alignment within normal limits. Skull base and vertebrae: No acute fracture. No primary bone lesion or focal pathologic process. Soft tissues and spinal canal: No prevertebral fluid or swelling. No visible canal hematoma. Disc levels: Post fusion changes C5 through C7. Mild degenerative change C4-C5  and C7-T1. Prominent degenerative change at the C1-C2 articulation. Upper chest: Negative. Other: None IMPRESSION: 1. No CT evidence for acute intracranial abnormality. Atrophy and mild small vessel ischemic changes of the white matter. 2. Straightening of the cervical  spine with fusion hardware C5 through C7. No acute osseous abnormality. Electronically Signed   By: Jasmine PangKim  Fujinaga M.D.   On: 06/09/2019 15:25   Dg Chest Portable 1 View  Result Date: 06/09/2019 CLINICAL DATA:  Mental status. EXAM: PORTABLE CHEST 1 VIEW COMPARISON:  02/18/2019. FINDINGS: Mediastinum hilar structures normal. Lungs are clear of acute infiltrates. Mild cardiomegaly. No pulmonary venous congestion. Metallic fragments again noted right chest. Prominent gastric distention. Cervical spine fusion. IMPRESSION: 1. Prominent gastric distention. 2. Borderline cardiomegaly. No pulmonary venous congestion. No focal pulmonary infiltrates. Electronically Signed   By: Maisie Fushomas  Register   On: 06/09/2019 15:12    Pending Labs Unresulted Labs (From admission, onward)    Start     Ordered   06/16/19 0500  Creatinine, serum  (enoxaparin (LOVENOX)    CrCl >/= 30 ml/min)  Weekly,   STAT    Comments: while on enoxaparin therapy    06/09/19 2125   06/10/19 0500  Protime-INR  Tomorrow morning,   STAT     06/09/19 2125   06/10/19 0500  Cortisol-am, blood  Tomorrow morning,   STAT     06/09/19 2125   06/10/19 0500  Procalcitonin  Tomorrow morning,   STAT     06/09/19 2125   06/10/19 0500  Basic metabolic panel  Tomorrow morning,   STAT     06/09/19 2125   06/10/19 0500  CBC  Tomorrow morning,   STAT     06/09/19 2125   06/09/19 2126  CBC  (enoxaparin (LOVENOX)    CrCl >/= 30 ml/min)  Once,   STAT    Comments: Baseline for enoxaparin therapy IF NOT ALREADY DRAWN.  Notify MD if PLT < 100 K.    06/09/19 2125   06/09/19 2126  Urine culture  Once,   STAT    Question:  Patient immune status  Answer:  Normal   06/09/19 2125   06/09/19 2126  Basic metabolic panel  STAT Now then every 4 hours ,   STAT     06/09/19 2125   06/09/19 2126  Culture, blood (routine x 2)  BLOOD CULTURE X 2,   STAT    Question:  Patient immune status  Answer:  Normal   06/09/19 2125   06/09/19 2126  Urine culture  Once,   STAT     Question:  Patient immune status  Answer:  Normal   06/09/19 2125   06/09/19 1604  Pathologist smear review  Once,   STAT     06/09/19 1604   06/09/19 1522  Urine culture  ONCE - STAT,   STAT     06/09/19 1522   06/09/19 1519  Blood culture (routine x 2)  BLOOD CULTURE X 2,   STAT     06/09/19 1518          Vitals/Pain Today's Vitals   06/09/19 2100 06/09/19 2110 06/09/19 2120 06/09/19 2126  BP: 113/85 116/87 129/82 121/78  Pulse: (!) 141 (!) 143 (!) 144 (!) 146  Resp: 16 18 (!) 25 (!) 21  Temp: (!) 93 F (33.9 C) (!) 93.3 F (34.1 C) (!) 93.6 F (34.2 C) (!) 93.7 F (34.3 C)  TempSrc:      SpO2: 100% 100% 100% 100%  Weight:      Height:        Isolation Precautions No active isolations  Medications Medications  norepinephrine (LEVOPHED) 4mg  in premix infusion (3 mcg/min Intravenous Rate/Dose Change 06/09/19 1851)  sodium bicarbonate 150 mEq in sterile water 1,000 mL infusion ( Intravenous New Bag/Given 06/09/19 1654)  insulin regular, human (MYXREDLIN) 100 units/ 100 mL infusion (16.2 Units/hr Intravenous Rate/Dose Change 06/09/19 2103)  enoxaparin (LOVENOX) injection 40 mg (has no administration in time range)  sodium chloride flush (NS) 0.9 % injection 3 mL (has no administration in time range)  0.9 %  sodium chloride infusion (has no administration in time range)  ondansetron (ZOFRAN) tablet 4 mg (has no administration in time range)    Or  ondansetron (ZOFRAN) injection 4 mg (has no administration in time range)  acetaminophen (TYLENOL) tablet 650 mg (has no administration in time range)    Or  acetaminophen (TYLENOL) suppository 650 mg (has no administration in time range)  0.9 %  sodium chloride infusion (has no administration in time range)  dextrose 5 %-0.45 % sodium chloride infusion (has no administration in time range)  insulin regular, human (MYXREDLIN) 100 units/ 100 mL infusion (has no administration in time range)  sodium chloride 0.9 % bolus 1,000  mL (has no administration in time range)  vancomycin (VANCOCIN) 500 mg in sodium chloride 0.9 % 100 mL IVPB (has no administration in time range)  vancomycin variable dose per unstable renal function (pharmacist dosing) (has no administration in time range)  ceFEPIme (MAXIPIME) 2 g in sodium chloride 0.9 % 100 mL IVPB (has no administration in time range)  naloxone [facility]) injection 0.4 mg (0.4 mg Intravenous Given 06/09/19 1446)  naloxone (NARCAN) injection 1 mg (1 mg Intravenous Given 06/09/19 1452)  sodium chloride 0.9 % bolus 1,000 mL (0 mLs Intravenous Stopped 06/09/19 1552)  sodium chloride 0.9 % bolus 1,000 mL (0 mLs Intravenous Stopped 06/09/19 1628)  sodium chloride 0.9 % bolus 1,000 mL (0 mLs Intravenous Stopped 06/09/19 1653)  ceFEPIme (MAXIPIME) 2 g in sodium chloride 0.9 % 100 mL IVPB (0 g Intravenous Stopped 06/09/19 1559)  metroNIDAZOLE (FLAGYL) IVPB 500 mg (0 mg Intravenous Stopped 06/09/19 1655)  vancomycin (VANCOCIN) IVPB 1000 mg/200 mL premix (0 mg Intravenous Stopped 06/09/19 1718)  sodium bicarbonate injection 50 mEq (50 mEq Intravenous Given 06/09/19 1533)  sodium chloride 0.9 % bolus 1,000 mL (0 mLs Intravenous Stopped 06/09/19 1917)  sodium chloride 0.9 % bolus 1,000 mL (1,000 mLs Intravenous New Bag/Given 06/09/19 2124)    Mobility Unknown of PTA High fall risk   Focused Assessments n/a   R Recommendations: See Admitting Provider Note  Report given to:   Additional Notes: n/a

## 2019-06-09 NOTE — ED Notes (Signed)
Paged NP Seals concerning pt heart rate now as hight as 165. NP Seals to speak with ICU MD.

## 2019-06-09 NOTE — ED Notes (Signed)
Blood drawn and sent to lab. Mittens applied to pt to prevent pt from pulling out his IV or foley due to confusion. Pt is talking incoherently and is unable to understand where he is or what is going on.

## 2019-06-09 NOTE — ED Notes (Signed)
ED TO INPATIENT HANDOFF REPORT  ED Nurse Name and Phone #: Clinton Sawyer 63  S Name/Age/Gender Spencer [name] 58 y.o. male Room/Bed: ED19A/ED19A  Code Status   Code Status: Prior  Home/SNF/Other Home Pt not oriented  Triage Complete: Triage complete  Chief Complaint unresponsive  Triage Note Pt arrives via ems. Very little information obtained by ems. Pt arrives being bagged and is unresponsive at this time. Unsure of events leading up to collapse/being unresponsive. CBG in the 500s obtained from EMS   Allergies No Known Allergies  Level of Care/Admitting Diagnosis ED Disposition    None      B Medical/Surgery History Past Medical History:  Diagnosis Date  . Brain aneurysm   . Broken bones    clavical, ankle, arms toes wriast   . Diabetes mellitus without complication (HCC)   . Dysrhythmia   . GERD (gastroesophageal reflux disease)   . Hypertension   . Substance abuse [facility])    Past Surgical History:  Procedure Laterality Date  . ESOPHAGOGASTRODUODENOSCOPY (EGD) WITH PROPOFOL N/A 06/04/2019   Procedure: ESOPHAGOGASTRODUODENOSCOPY (EGD) WITH PROPOFOL;  Surgeon: Toledo, Boykin Nearing, MD;  Location: [facility] ENDOSCOPY;  Service: Gastroenterology;  Laterality: N/A;  . HERNIA REPAIR    . IMPLANTATION / PLACEMENT OF STRIP ELECTRODES VIA BURR HOLES SUBDURAL     anyrusum  . NECK SURGERY    . TEE WITHOUT CARDIOVERSION N/A 06/03/2019   Procedure: TRANSESOPHAGEAL ECHOCARDIOGRAM (TEE);  Surgeon: Dalia Heading, MD;  Location: [facility] ORS;  Service: Cardiovascular;  Laterality: N/A;     A IV Location/Drains/Wounds Patient Lines/Drains/Airways Status   Active Line/Drains/Airways    Name:   Placement date:   Placement time:   Site:   Days:   Peripheral IV 06/09/19 Right Arm   06/09/19    1450    Arm   less than 1   Peripheral IV 06/09/19 Left Wrist   06/09/19    1458    Wrist   less than 1   Peripheral IV 06/09/19 Left Antecubital   06/09/19    1530    Antecubital   less than 1    Peripheral IV 06/09/19 Right Antecubital   06/09/19    1616    Antecubital   less than 1   Urethral Catheter Vet, RN Temperature probe   06/09/19    1550    Temperature probe   less than 1          Intake/Output Last 24 hours No intake or output data in the 24 hours ending 06/09/19 1713  Labs/Imaging Results for orders placed or performed during the hospital encounter of 06/09/19 (from the past 48 hour(s))  Glucose, capillary     Status: Abnormal   Collection Time: 06/09/19  2:51 PM  Result Value Ref Range   Glucose-Capillary >600 (HH) 70 - 99 mg/dL   Comment 1 Notify RN    Comment 2 Document in Chart   Blood gas, venous     Status: Abnormal (Preliminary result)   Collection Time: 06/09/19  2:53 PM  Result Value Ref Range   pH, Ven <6.900 (LL) 7.250 - 7.430    Comment: CRITICAL RESULT CALLED TO, READ BACK BY AND VERIFIED WITH: CRITICAL VALUE 06/09/19/1555 DR. FUNKE/FD    pCO2, Ven <19.0 (LL) 44.0 - 60.0 mmHg    Comment: CRITICAL RESULT CALLED TO, READ BACK BY AND VERIFIED WITH: CRITICAL VALUE 06/09/19,1555 DR. FUNKE/FD    pO2, Ven 62.0 (H) 32.0 - 45.0 mmHg   Bicarbonate PENDING  20.0 - 28.0 mmol/L   O2 Saturation PENDING %   Patient temperature 37.0    Collection site VEIN    Sample type VEIN     Comment: Performed at [facility], [address] [place], [place] [address]  Urinalysis, Routine w reflex microscopic     Status: Abnormal   Collection Time: 06/09/19  2:53 PM  Result Value Ref Range   Color, Urine YELLOW (A) YELLOW   APPearance HAZY (A) CLEAR   Specific Gravity, Urine 1.017 1.005 - 1.030   pH 5.0 5.0 - 8.0   Glucose, UA >=500 (A) NEGATIVE mg/dL   Hgb urine dipstick SMALL (A) NEGATIVE   Bilirubin Urine NEGATIVE NEGATIVE   Ketones, ur 80 (A) NEGATIVE mg/dL   Protein, ur 30 (A) NEGATIVE mg/dL   Nitrite NEGATIVE NEGATIVE   Leukocytes,Ua NEGATIVE NEGATIVE   RBC / HPF 0-5 0 - 5 RBC/hpf   WBC, UA 0-5 0 - 5 WBC/hpf   Bacteria, UA RARE (A) NONE SEEN    Squamous Epithelial / LPF NONE SEEN 0 - 5    Comment: Performed at [facility], [address] [place], [place] [address]  CK     Status: None   Collection Time: 06/09/19  3:18 PM  Result Value Ref Range   Total CK 188 49 - 397 U/L    Comment: Performed at [facility], [address] [place], [place] [address]  Lactic acid, plasma     Status: Abnormal   Collection Time: 06/09/19  3:19 PM  Result Value Ref Range   Lactic Acid, Venous 4.8 (HH) 0.5 - 1.9 mmol/L    Comment: CRITICAL RESULT CALLED TO, READ BACK BY AND VERIFIED WITH Spencer [name] AT 1620 ON 06/09/2019 [facility]. Performed at [facility], [address] [place], [place] [address]   SARS Coronavirus 2 [facility](Hospital order, Performed in [facility]) Nasopharyngeal Nasopharyngeal Swab     Status: None   Collection Time: 06/09/19  3:19 PM   Specimen: Nasopharyngeal Swab  Result Value Ref Range   SARS Coronavirus 2 NEGATIVE NEGATIVE    Comment: (NOTE) If result is NEGATIVE SARS-CoV-2 target nucleic acids are NOT DETECTED. The SARS-CoV-2 RNA is generally detectable in upper and lower  respiratory specimens during the acute phase of infection. The lowest  concentration of SARS-CoV-2 viral copies this assay can detect is 250  copies / mL. A negative result does not preclude SARS-CoV-2 infection  and should not be used as the sole basis for treatment or other  patient management decisions.  A negative result may occur with  improper specimen collection / handling, submission of specimen other  than nasopharyngeal swab, presence of viral mutation(s) within the  areas targeted by this assay, and inadequate number of viral copies  (<250 copies / mL). A negative result must be combined with clinical  observations, patient history, and epidemiological information. If result is POSITIVE SARS-CoV-2 target nucleic acids are DETECTED. The SARS-CoV-2 RNA is generally detectable in upper and lower   respiratory specimens dur ing the acute phase of infection.  Positive  results are indicative of active infection with SARS-CoV-2.  Clinical  correlation with patient history and other diagnostic information is  necessary to determine patient infection status.  Positive results do  not rule out bacterial infection or co-infection with other viruses. If result is PRESUMPTIVE POSTIVE SARS-CoV-2 nucleic acids MAY BE PRESENT.   A presumptive positive result was obtained on the submitted specimen  and  confirmed on repeat testing.  While 2019 novel coronavirus  (SARS-CoV-2) nucleic acids may be present in the submitted sample  additional confirmatory testing may be necessary for epidemiological  and / or clinical management purposes  to differentiate between  SARS-CoV-2 and other Sarbecovirus currently known to infect humans.  If clinically indicated additional testing with an alternate test  methodology [phone] is advised. The SARS-CoV-2 RNA is generally  detectable in upper and lower respiratory sp ecimens during the acute  phase of infection. The expected result is Negative. Fact Sheet for Patients:  BoilerBrush.com.cyhttps://www.fda.gov/media/136312/download Fact Sheet for Healthcare Providers: https://pope.com/https://www.fda.gov/media/136313/download This test is not yet approved or cleared by the [place] FDA and has been authorized for detection and/or diagnosis of SARS-CoV-2 by FDA under an Emergency Use Authorization (EUA).  This EUA will remain in effect (meaning this test can be used) for the duration of the COVID-19 declaration under Section 564(b)(1) of the Act, 21 U.S.C. section 360bbb-3(b)(1), unless the authorization is terminated or revoked sooner. Performed at [facility], [address] [place], [place] [address]   CBC with Differential     Status: Abnormal (Preliminary result)   Collection Time: 06/09/19  4:04 PM  Result Value Ref Range   WBC 17.6 (H) 4.0 - 10.5 K/uL   RBC 2.77  (L) 4.22 - 5.81 MIL/uL   Hemoglobin 8.8 (L) 13.0 - 17.0 g/dL   HCT 95.632.8 (L) 21.339.0 - 08.652.0 %   MCV 118.4 (H) 80.0 - 100.0 fL   MCH 31.8 26.0 - 34.0 pg   MCHC 26.8 (L) 30.0 - 36.0 g/dL   RDW 57.813.1 46.911.5 - 62.915.5 %   Platelets 790 (H) 150 - 400 K/uL   nRBC 0.0 0.0 - 0.2 %    Comment: Performed at [facility], [address] [place], [place] [address]   Neutrophils Relative % PENDING %   Neutro Abs PENDING 1.7 - 7.7 K/uL   Band Neutrophils PENDING %   Lymphocytes Relative PENDING %   Lymphs Abs PENDING 0.7 - 4.0 K/uL   Monocytes Relative PENDING %   Monocytes Absolute PENDING 0.1 - 1.0 K/uL   Eosinophils Relative PENDING %   Eosinophils Absolute PENDING 0.0 - 0.5 K/uL   Basophils Relative PENDING %   Basophils Absolute PENDING 0.0 - 0.1 K/uL   WBC Morphology PENDING    RBC Morphology PENDING    Smear Review PENDING    Other PENDING %   nRBC PENDING 0 /100 WBC   Metamyelocytes Relative PENDING %   Myelocytes PENDING %   Promyelocytes Relative PENDING %   Blasts PENDING %  Protime-INR     Status: Abnormal   Collection Time: 06/09/19  4:04 PM  Result Value Ref Range   Prothrombin Time 17.7 (H) 11.4 - 15.2 seconds   INR 1.5 (H) 0.8 - 1.2    Comment: (NOTE) INR goal varies based on device and disease states. Performed at [facility], [address] [place], [place] [address]   APTT     Status: Abnormal   Collection Time: 06/09/19  4:04 PM  Result Value Ref Range   aPTT 42 (H) 24 - 36 seconds    Comment:        IF BASELINE aPTT IS ELEVATED, SUGGEST PATIENT RISK ASSESSMENT BE USED TO DETERMINE APPROPRIATE ANTICOAGULANT THERAPY. Performed at [facility], [address] [place], [place] [address]   Ethanol     Status: None   Collection Time: 06/09/19  4:04 PM  Result Value Ref Range   Alcohol, Ethyl (B) <10 <10 mg/dL    Comment: (NOTE) Lowest detectable limit for serum alcohol is 10 mg/dL. For medical purposes only. Performed at [facility], [address] [place], [place] [address]   Acetaminophen level     Status: Abnormal   Collection Time: 06/09/19  4:04 PM  Result Value Ref Range   Acetaminophen (Tylenol), Serum <10 (L) 10 - 30 ug/mL    Comment: (NOTE) Therapeutic concentrations vary significantly. A range of 10-30 ug/mL  may be an effective concentration for many patients. However, some  are best treated at concentrations outside of this range. Acetaminophen concentrations >150 ug/mL at 4 hours after ingestion  and >50 ug/mL at 12 hours after ingestion are often associated with  toxic reactions. Performed at [facility], [address] [place], [place] [address]   Salicylate level     Status: None   Collection Time: 06/09/19  4:04 PM  Result Value Ref Range   Salicylate Lvl <6.3 2.8 - 30.0 mg/dL    Comment: Performed at [facility], [place], [place] [address]   Ct Head Wo Contrast  Result Date: 06/09/2019 CLINICAL DATA:  Altered mental status, unknown down time EXAM: CT HEAD WITHOUT CONTRAST CT CERVICAL SPINE WITHOUT CONTRAST TECHNIQUE: Multidetector CT imaging of the head and cervical spine was performed following the standard protocol without intravenous contrast. Multiplanar CT image reconstructions of the cervical spine were also generated. COMPARISON:  CT 10/24/2019, 07/04/2019 FINDINGS: CT HEAD FINDINGS Brain: No acute territorial infarction, hemorrhage, or intracranial mass. Chronic lacunar infarct left basal ganglia. Mild atrophy. Mild small vessel ischemic changes of the white matter. Vascular: No hyperdense vessels.  Carotid vascular calcification Skull: Left frontal and posterior parietal burr holes.  No fracture Sinuses/Orbits: Fluid level right maxillary sinus. Patchy mucosal thickening in the ethmoid sinuses Other: None CT CERVICAL SPINE FINDINGS Alignment: Straightening of the cervical spine. Facet alignment within normal limits. Skull base and vertebrae:  No acute fracture. No primary bone lesion or focal pathologic process. Soft tissues and spinal canal: No prevertebral fluid or swelling. No visible canal hematoma. Disc levels: Post fusion changes C5 through C7. Mild degenerative change C4-C5 and C7-T1. Prominent degenerative change at the C1-C2 articulation. Upper chest: Negative. Other: None IMPRESSION: 1. No CT evidence for acute intracranial abnormality. Atrophy and mild small vessel ischemic changes of the white matter. 2. Straightening of the cervical spine with fusion hardware C5 through C7. No acute osseous abnormality. Electronically Signed   By: Donavan Foil M.D.   On: 06/09/2019 15:25   Ct Cervical Spine Wo Contrast  Result Date: 06/09/2019 CLINICAL DATA:  Altered mental status, unknown down time EXAM: CT HEAD WITHOUT CONTRAST CT CERVICAL SPINE WITHOUT CONTRAST TECHNIQUE: Multidetector CT imaging of the head and cervical spine was performed following the standard protocol without intravenous contrast. Multiplanar CT image reconstructions of the cervical spine were also generated. COMPARISON:  CT 10/24/2019, 07/04/2019 FINDINGS: CT HEAD FINDINGS Brain: No acute territorial infarction, hemorrhage, or intracranial mass. Chronic lacunar infarct left basal ganglia. Mild atrophy. Mild small vessel ischemic changes of the white matter. Vascular: No hyperdense vessels.  Carotid vascular calcification Skull: Left frontal and posterior parietal burr holes.  No fracture Sinuses/Orbits: Fluid level right maxillary sinus. Patchy mucosal thickening in the ethmoid sinuses Other: None CT CERVICAL SPINE FINDINGS Alignment: Straightening of the cervical spine. Facet alignment within normal limits. Skull base and vertebrae: No acute fracture. No primary bone  lesion or focal pathologic process. Soft tissues and spinal canal: No prevertebral fluid or swelling. No visible canal hematoma. Disc levels: Post fusion changes C5 through C7. Mild degenerative change C4-C5 and  C7-T1. Prominent degenerative change at the C1-C2 articulation. Upper chest: Negative. Other: None IMPRESSION: 1. No CT evidence for acute intracranial abnormality. Atrophy and mild small vessel ischemic changes of the white matter. 2. Straightening of the cervical spine with fusion hardware C5 through C7. No acute osseous abnormality. Electronically Signed   By: Jasmine Pang M.D.   On: 06/09/2019 15:25   Dg Chest Portable 1 View  Result Date: 06/09/2019 CLINICAL DATA:  Mental status. EXAM: PORTABLE CHEST 1 VIEW COMPARISON:  02/18/2019. FINDINGS: Mediastinum hilar structures normal. Lungs are clear of acute infiltrates. Mild cardiomegaly. No pulmonary venous congestion. Metallic fragments again noted right chest. Prominent gastric distention. Cervical spine fusion. IMPRESSION: 1. Prominent gastric distention. 2. Borderline cardiomegaly. No pulmonary venous congestion. No focal pulmonary infiltrates. Electronically Signed   By: Maisie Fus  Register   On: 06/09/2019 15:12    Pending Labs Unresulted Labs (From admission, onward)    Start     Ordered   06/09/19 1522  Urine culture  ONCE - STAT,   STAT     06/09/19 1522   06/09/19 1519  Blood culture (routine x 2)  BLOOD CULTURE X 2,   STAT     06/09/19 1518   06/09/19 1519  Lactic acid, plasma  Now then every 2 hours,   STAT     06/09/19 1518   06/09/19 1454  Beta-hydroxybutyric acid  Once,   STAT     06/09/19 1454   06/09/19 1453  Comprehensive metabolic panel  ONCE - STAT,   STAT     06/09/19 1454   06/09/19 1453  Urine Drug Screen, Qualitative ([facility] only)  Once,   STAT     06/09/19 1454          Vitals/Pain Today's Vitals   06/09/19 1654 06/09/19 1658 06/09/19 1704 06/09/19 1710  BP: (!) 85/45 (!) 89/55 108/61 (!) 88/57  Pulse: 99 97 (!) 104   Resp: 20 13 17 14   Temp: (!) 87.5 F (30.8 C) (!) 87.6 F (30.9 C) (!) 87.7 F (30.9 C) (!) 87.7 F (30.9 C)  TempSrc:      SpO2: 100% 100% 100%   Weight:      Height:        Isolation  Precautions No active isolations  Medications Medications  norepinephrine (LEVOPHED) 4mg  in premix infusion (6 mcg/min Intravenous Rate/Dose Change 06/09/19 1711)  vancomycin (VANCOCIN) IVPB 1000 mg/200 mL premix (1,000 mg Intravenous New Bag/Given 06/09/19 1619)  sodium bicarbonate 150 mEq in sterile water 1,000 mL infusion ( Intravenous New Bag/Given 06/09/19 1654)  naloxone (NARCAN) injection 0.4 mg (0.4 mg Intravenous Given 06/09/19 1446)  naloxone (NARCAN) injection 1 mg (1 mg Intravenous Given 06/09/19 1452)  sodium chloride 0.9 % bolus 1,000 mL (0 mLs Intravenous Stopped 06/09/19 1552)  sodium chloride 0.9 % bolus 1,000 mL (0 mLs Intravenous Stopped 06/09/19 1628)  sodium chloride 0.9 % bolus 1,000 mL (0 mLs Intravenous Stopped 06/09/19 1653)  ceFEPIme (MAXIPIME) 2 g in sodium chloride 0.9 % 100 mL IVPB (0 g Intravenous Stopped 06/09/19 1559)  metroNIDAZOLE (FLAGYL) IVPB 500 mg (0 mg Intravenous Stopped 06/09/19 1655)  sodium bicarbonate injection 50 mEq (50 mEq Intravenous Given 06/09/19 1533)    Mobility Walks (baseline) Moderate fall risk   Focused Assessments Neuro Assessment Handoff:  Swallow screen pass? No  Cardiac Rhythm: Sinus tachycardia       Neuro Assessment:   Neuro Checks:      Last Documented NIHSS Modified Score:   Has TPA been given? No If patient is a Neuro Trauma and patient is going to OR before floor call report to 4N Charge nurse: [phone] or [phone]     R Recommendations: See Admitting Provider Note  Report given to:   Additional Notes: dka sister [name] [phone] states she didn't think her brother "was right" last night and noted "something seemed off."

## 2019-06-10 DIAGNOSIS — E872 Acidosis: Secondary | ICD-10-CM

## 2019-06-10 DIAGNOSIS — R7989 Other specified abnormal findings of blood chemistry: Secondary | ICD-10-CM

## 2019-06-10 DIAGNOSIS — F1410 Cocaine abuse, uncomplicated: Secondary | ICD-10-CM

## 2019-06-10 LAB — BASIC METABOLIC PANEL
Anion gap: 12 (ref 5–15)
Anion gap: 16 — ABNORMAL HIGH (ref 5–15)
Anion gap: 24 — ABNORMAL HIGH (ref 5–15)
BUN: 44 mg/dL — ABNORMAL HIGH (ref 6–20)
BUN: 47 mg/dL — ABNORMAL HIGH (ref 6–20)
BUN: 53 mg/dL — ABNORMAL HIGH (ref 6–20)
CO2: 19 mmol/L — ABNORMAL LOW (ref 22–32)
CO2: 23 mmol/L (ref 22–32)
CO2: 9 mmol/L — ABNORMAL LOW (ref 22–32)
Calcium: 7.1 mg/dL — ABNORMAL LOW (ref 8.9–10.3)
Calcium: 7.3 mg/dL — ABNORMAL LOW (ref 8.9–10.3)
Calcium: 7.4 mg/dL — ABNORMAL LOW (ref 8.9–10.3)
Chloride: 106 mmol/L (ref 98–111)
Chloride: 111 mmol/L (ref 98–111)
Chloride: 111 mmol/L (ref 98–111)
Creatinine, Ser: 2.8 mg/dL — ABNORMAL HIGH (ref 0.61–1.24)
Creatinine, Ser: 2.99 mg/dL — ABNORMAL HIGH (ref 0.61–1.24)
Creatinine, Ser: 3.52 mg/dL — ABNORMAL HIGH (ref 0.61–1.24)
GFR calc Af Amer: 21 mL/min — ABNORMAL LOW (ref >60)
GFR calc Af Amer: 25 mL/min — ABNORMAL LOW (ref >60)
GFR calc Af Amer: 27 mL/min — ABNORMAL LOW (ref >60)
GFR calc non Af Amer: 18 mL/min — ABNORMAL LOW (ref >60)
GFR calc non Af Amer: 22 mL/min — ABNORMAL LOW (ref >60)
GFR calc non Af Amer: 23 mL/min — ABNORMAL LOW (ref >60)
Glucose, Bld: 294 mg/dL — ABNORMAL HIGH (ref 70–99)
Glucose, Bld: 435 mg/dL — ABNORMAL HIGH (ref 70–99)
Glucose, Bld: 749 mg/dL (ref 70–99)
Potassium: 3.1 mmol/L — ABNORMAL LOW (ref 3.5–5.1)
Potassium: 3.3 mmol/L — ABNORMAL LOW (ref 3.5–5.1)
Potassium: 4 mmol/L (ref 3.5–5.1)
Sodium: 139 mmol/L (ref 135–145)
Sodium: 146 mmol/L — ABNORMAL HIGH (ref 135–145)
Sodium: 146 mmol/L — ABNORMAL HIGH (ref 135–145)

## 2019-06-10 LAB — CBC
HCT: 22.9 % — ABNORMAL LOW (ref 39.0–52.0)
Hemoglobin: 7.7 g/dL — ABNORMAL LOW (ref 13.0–17.0)
MCH: 31.6 pg (ref 26.0–34.0)
MCHC: 33.6 g/dL (ref 30.0–36.0)
MCV: 93.9 fL (ref 80.0–100.0)
Platelets: 422 10*3/uL — ABNORMAL HIGH (ref 150–400)
RBC: 2.44 MIL/uL — ABNORMAL LOW (ref 4.22–5.81)
RDW: 12.2 % (ref 11.5–15.5)
WBC: 16.6 10*3/uL — ABNORMAL HIGH (ref 4.0–10.5)
nRBC: 0 % (ref 0.0–0.2)

## 2019-06-10 LAB — PROTIME-INR
INR: 1.1 (ref 0.8–1.2)
Prothrombin Time: 14.4 s (ref 11.4–15.2)

## 2019-06-10 LAB — CORTISOL-AM, BLOOD: Cortisol - AM: 32.7 ug/dL — ABNORMAL HIGH (ref 6.7–22.6)

## 2019-06-10 LAB — PATHOLOGIST SMEAR REVIEW

## 2019-06-10 LAB — GLUCOSE, CAPILLARY
Glucose-Capillary: 121 mg/dL — ABNORMAL HIGH (ref 70–99)
Glucose-Capillary: 123 mg/dL — ABNORMAL HIGH (ref 70–99)
Glucose-Capillary: 125 mg/dL — ABNORMAL HIGH (ref 70–99)
Glucose-Capillary: 129 mg/dL — ABNORMAL HIGH (ref 70–99)
Glucose-Capillary: 141 mg/dL — ABNORMAL HIGH (ref 70–99)
Glucose-Capillary: 142 mg/dL — ABNORMAL HIGH (ref 70–99)
Glucose-Capillary: 159 mg/dL — ABNORMAL HIGH (ref 70–99)
Glucose-Capillary: 164 mg/dL — ABNORMAL HIGH (ref 70–99)
Glucose-Capillary: 240 mg/dL — ABNORMAL HIGH (ref 70–99)
Glucose-Capillary: 272 mg/dL — ABNORMAL HIGH (ref 70–99)
Glucose-Capillary: 328 mg/dL — ABNORMAL HIGH (ref 70–99)
Glucose-Capillary: 351 mg/dL — ABNORMAL HIGH (ref 70–99)
Glucose-Capillary: 385 mg/dL — ABNORMAL HIGH (ref 70–99)
Glucose-Capillary: 468 mg/dL — ABNORMAL HIGH (ref 70–99)
Glucose-Capillary: 538 mg/dL (ref 70–99)
Glucose-Capillary: 577 mg/dL (ref 70–99)
Glucose-Capillary: >600 mg/dL (ref 70–99)

## 2019-06-10 LAB — TROPONIN I (HIGH SENSITIVITY)
Troponin I (High Sensitivity): 202 ng/L (ref <18)
Troponin I (High Sensitivity): 268 ng/L (ref <18)
Troponin I (High Sensitivity): 288 ng/L (ref <18)
Troponin I (High Sensitivity): 42 ng/L — ABNORMAL HIGH (ref <18)

## 2019-06-10 LAB — PROCALCITONIN: Procalcitonin: 4.92 ng/mL

## 2019-06-10 LAB — MRSA PCR SCREENING: MRSA by PCR: NEGATIVE

## 2019-06-10 LAB — LACTIC ACID, PLASMA
Lactic Acid, Venous: 0.8 mmol/L (ref 0.5–1.9)
Lactic Acid, Venous: 1 mmol/L (ref 0.5–1.9)

## 2019-06-10 MED ORDER — PHENYLEPHRINE HCL-NACL 10-0.9 MG/250ML-% IV SOLN
0.0000 ug/min | INTRAVENOUS | Status: DC
  Filled 2019-06-10: qty 250

## 2019-06-10 MED ORDER — VITAMIN B-1 100 MG PO TABS
100.0000 mg | ORAL_TABLET | Freq: Every day | ORAL | Status: DC
  Administered 2019-06-11 – 2019-06-12 (×2): 100 mg via ORAL
  Filled 2019-06-10 (×2): qty 1

## 2019-06-10 MED ORDER — LORAZEPAM 1 MG PO TABS: 1.0000 mg | ORAL_TABLET | Freq: Four times a day (QID) | ORAL | Status: DC | PRN

## 2019-06-10 MED ORDER — ADULT MULTIVITAMIN W/MINERALS CH
1.0000 | ORAL_TABLET | Freq: Every day | ORAL | Status: DC
  Administered 2019-06-11 – 2019-06-12 (×2): 1 via ORAL
  Filled 2019-06-10 (×2): qty 1

## 2019-06-10 MED ORDER — SODIUM CHLORIDE 0.9 % IV SOLN
0.0000 ug/min | INTRAVENOUS | Status: DC
  Filled 2019-06-10: qty 1

## 2019-06-10 MED ORDER — FOLIC ACID 1 MG PO TABS
1.0000 mg | ORAL_TABLET | Freq: Every day | ORAL | Status: DC
  Administered 2019-06-11 – 2019-06-12 (×2): 1 mg via ORAL
  Filled 2019-06-10 (×2): qty 1

## 2019-06-10 MED ORDER — POTASSIUM CHLORIDE 10 MEQ/100ML IV SOLN
10.0000 meq | INTRAVENOUS | Status: AC
  Administered 2019-06-10 (×4): 10 meq via INTRAVENOUS
  Filled 2019-06-10 (×4): qty 100

## 2019-06-10 MED ORDER — LORAZEPAM 2 MG/ML IJ SOLN: 1.0000 mg | Freq: Four times a day (QID) | INTRAMUSCULAR | Status: DC | PRN

## 2019-06-10 MED ORDER — HEPARIN SODIUM (PORCINE) 5000 UNIT/ML IJ SOLN
5000.0000 [IU] | Freq: Three times a day (TID) | INTRAMUSCULAR | Status: DC
  Administered 2019-06-10 – 2019-06-12 (×7): 5000 [IU] via SUBCUTANEOUS
  Filled 2019-06-10 (×7): qty 1

## 2019-06-10 MED ORDER — THIAMINE HCL 100 MG/ML IJ SOLN
100.0000 mg | Freq: Every day | INTRAMUSCULAR | Status: DC
  Administered 2019-06-10: 100 mg via INTRAVENOUS
  Filled 2019-06-10: qty 2

## 2019-06-10 MED ORDER — LACTATED RINGERS IV SOLN
INTRAVENOUS | Status: DC
  Administered 2019-06-10: 08:00:00 via INTRAVENOUS

## 2019-06-10 MED ORDER — POTASSIUM CHLORIDE 10 MEQ/100ML IV SOLN
10.0000 meq | INTRAVENOUS | Status: AC
  Administered 2019-06-10 (×2): 10 meq via INTRAVENOUS
  Filled 2019-06-10 (×2): qty 100

## 2019-06-10 MED ORDER — INSULIN GLARGINE 100 UNIT/ML ~~LOC~~ SOLN
25.0000 [IU] | Freq: Every day | SUBCUTANEOUS | Status: DC
  Administered 2019-06-10 – 2019-06-12 (×3): 25 [IU] via SUBCUTANEOUS
  Filled 2019-06-10 (×5): qty 0.25

## 2019-06-10 MED ORDER — INSULIN ASPART 100 UNIT/ML ~~LOC~~ SOLN
0.0000 [IU] | SUBCUTANEOUS | Status: DC
  Administered 2019-06-10 – 2019-06-11 (×2): 1 [IU] via SUBCUTANEOUS
  Filled 2019-06-10 (×2): qty 1

## 2019-06-10 NOTE — Progress Notes (Signed)
Dr. Mortimer Fries notified of elevated Troponin of 268.

## 2019-06-10 NOTE — Progress Notes (Signed)
Name: Spencer [name] MRN: [name] DOB: 07/24/1961     CONSULTATION DATE: 06/09/2019  HISTORY OF PRESENT ILLNESS:   58 y.o male with a history of T1DM, substance abuse, HTN, GERD, and brain aneurysm with recently D/c, 7/29, following treatment for acute encephalopathy, DKA, and sepsis. 8/3 patient was found down and unresponsive by friend for unknown length of time. EMS arrived patient required bag masked ventilation, narcan administered, ROS archived, AMS noted. Upon arrival to ED found to be in DKA, BG- 1,124 and patient was hypothermic- 31.4 C, Insulin infusion initiated along with BiCarb drip due to metabolic acidosis and a Bair huggerwas placed. Toxicology positive for cocaine. Patient requiring vasopressors in ED, sepsis protocol initiated receiving vancomycin, cefepime and continued on fluconazole as his blood cultures on last admission showed candida albicans. CTH, CT spine, CXR, and EKG performed in ED.   EVENTS OVERNIGHT: Patient no longer requiring vasopressors. Noted to be agitated requiring saftey mits. Remains somnolent but respond to verbal stimuli. He is able to maintain adequate oxygen saturation on RA.   SIGNIFICANT EVENTS: 06/09/19: Admitted to ICU for DKA and suspected sepsis 06/09/19: CTH and spine- no acute changes 06/09/19: CXR- cardiomegaly and gastric distension  06/09/19: Vasopressors D/C 06/10/19: Trending troponin's   06/10/19: Sodium Bicarb D/C, transition off IV insulin   PAST MEDICAL HISTORY :   has a past medical history of Brain aneurysm, Broken bones, Diabetes mellitus without complication (HCC), Dysrhythmia, GERD (gastroesophageal reflux disease), Hypertension, and Substance abuse (HCC).  has a past surgical history that includes Neck surgery; Hernia repair; TEE without cardioversion (N/A, 06/03/2019); Implantation / placement of strip electrodes via burr holes subdural; and Esophagogastroduodenoscopy (egd) with propofol (N/A, 06/04/2019).No Known Allergies   REVIEW OF  SYSTEMS:   Unable to obtain due to lethargy and AMS.   VITAL SIGNS: Temp:  [87 F (30.6 C)-98.7 F (37.1 C)] 97.5 F (36.4 C) (08/04 0800) Pulse Rate:  [44-156] 76 (08/04 1100) Resp:  [12-31] 15 (08/04 1100) BP: (62-140)/(41-97) 132/79 (08/04 1100) SpO2:  [88 %-100 %] 100 % (08/04 1100) Weight:  [63.5 kg-66.6 kg] 66.6 kg (08/04 0000)   I/O last 3 completed shifts: In: 2618.5 [I.V.:1434.1; IV Piggyback:1184.4] Out: 1450 [Urine:1450] Total I/O In: 381.4 [I.V.:353.4; IV Piggyback:27.9] Out: 300 [Urine:300]   SpO2: 100 %   Physical Examination:  GENERAL:critically ill appearing HEAD: Normocephalic, atraumatic.  EYES: Pupils equal, round, reactive to light.  No scleral icterus.  MOUTH: Moist mucosal membrane. NECK: Supple. No JVD, no lymphadenopathy  PULMONARY: +rhonchi CARDIOVASCULAR: S1 and S2. Regular rate and rhythm. No murmurs, rubs, or gallops.  GASTROINTESTINAL: Normoactive bowel sounds, guarding noted, LUQ and RUQ tenderness with palpation.  MUSCULOSKELETAL: No swelling or edema.  NEUROLOGIC: somnolent, does not follow commands, GCS 9  SKIN:intact,warm,dry  I personally reviewed lab work that was obtained in last 24 hrs. CBC    Component Value Date/Time   WBC 16.6 (H) 06/10/2019 0456   RBC 2.44 (L) 06/10/2019 0456   HGB 7.7 (L) 06/10/2019 0456   HCT 22.9 (L) 06/10/2019 0456   PLT 422 (H) 06/10/2019 0456   MCV 93.9 06/10/2019 0456   MCH 31.6 06/10/2019 0456   MCHC 33.6 06/10/2019 0456   RDW 12.2 06/10/2019 0456   LYMPHSABS 2.4 06/09/2019 1604   MONOABS 0.5 06/09/2019 1604   EOSABS 0.0 06/09/2019 1604   BASOSABS 0.1 06/09/2019 1604   BMP Latest Ref Rng & Units 06/10/2019 06/10/2019 06/10/2019  Glucose 70 - 99 mg/dL 409(W294(H) 119(J435(H) 478(GN749(HH)  BUN 6 -  20 mg/dL 09(W44(H) 11(B47(H) 14(N53(H)  Creatinine 0.61 - 1.24 mg/dL 8.29(F2.80(H) 6.21(H2.99(H) 0.86(V3.52(H)  Sodium 135 - 145 mmol/L 146(H) 146(H) 139  Potassium 3.5 - 5.1 mmol/L 3.1(L) 3.3(L) 4.0  Chloride 98 - 111 mmol/L 111 111 106  CO2 22  - 32 mmol/L 23 19(L) 9(L)  Calcium 8.9 - 10.3 mg/dL 7.3(L) 7.4(L) 7.1(L)   Troponin I (high sensitivity): 288, 268, 202, 42  Procalcitonin: 4.92  MEDICATIONS: I have reviewed all medications and confirmed regimen as documented  CULTURE RESULTS   Recent Results (from the past 240 hour(s))  Blood culture (routine x 2)     Status: None (Preliminary result)   Collection Time: 06/09/19  3:19 PM   Specimen: BLOOD  Result Value Ref Range Status   Specimen Description BLOOD LEFT ANTECUBITAL  Final   Special Requests   Final    BOTTLES DRAWN AEROBIC AND ANAEROBIC Blood Culture adequate volume   Culture   Final    NO GROWTH < 24 HOURS Performed at [facility], [address] [place], [place] [address]    Report Status PENDING  Incomplete  SARS Coronavirus 2 [facility](Hospital order, Performed in [facility] lab) Nasopharyngeal Nasopharyngeal Swab     Status: None   Collection Time: 06/09/19  3:19 PM   Specimen: Nasopharyngeal Swab  Result Value Ref Range Status   SARS Coronavirus 2 NEGATIVE NEGATIVE Final    Comment: (NOTE) If result is NEGATIVE SARS-CoV-2 target nucleic acids are NOT DETECTED. The SARS-CoV-2 RNA is generally detectable in upper and lower  respiratory specimens during the acute phase of infection. The lowest  concentration of SARS-CoV-2 viral copies this assay can detect is 250  copies / mL. A negative result does not preclude SARS-CoV-2 infection  and should not be used as the sole basis for treatment or other  patient management decisions.  A negative result may occur with  improper specimen collection / handling, submission of specimen other  than nasopharyngeal swab, presence of viral mutation(s) within the  areas targeted by this assay, and inadequate number of viral copies  (<250 copies / mL). A negative result must be combined with clinical  observations, patient history, and epidemiological information. If result is POSITIVE SARS-CoV-2 target  nucleic acids are DETECTED. The SARS-CoV-2 RNA is generally detectable in upper and lower  respiratory specimens dur ing the acute phase of infection.  Positive  results are indicative of active infection with SARS-CoV-2.  Clinical  correlation with patient history and other diagnostic information is  necessary to determine patient infection status.  Positive results do  not rule out bacterial infection or co-infection with other viruses. If result is PRESUMPTIVE POSTIVE SARS-CoV-2 nucleic acids MAY BE PRESENT.   A presumptive positive result was obtained on the submitted specimen  and confirmed on repeat testing.  While 2019 novel coronavirus  (SARS-CoV-2) nucleic acids may be present in the submitted sample  additional confirmatory testing may be necessary for epidemiological  and / or clinical management purposes  to differentiate between  SARS-CoV-2 and other Sarbecovirus currently known to infect humans.  If clinically indicated additional testing with an alternate test  methodology [phone] is advised. The SARS-CoV-2 RNA is generally  detectable in upper and lower respiratory sp ecimens during the acute  phase of infection. The expected result is Negative. Fact Sheet for Patients:  BoilerBrush.com.cyhttps://www.fda.gov/media/136312/download Fact Sheet for Healthcare Providers: https://pope.com/https://www.fda.gov/media/136313/download This test is not yet approved or cleared by the [place] FDA and has been authorized for detection  and/or diagnosis of SARS-CoV-2 by FDA under an Emergency Use Authorization (EUA).  This EUA will remain in effect (meaning this test can be used) for the duration of the COVID-19 declaration under Section 564(b)(1) of the Act, 21 U.S.C. section 360bbb-3(b)(1), unless the authorization is terminated or revoked sooner. Performed at [facility], [address] [place], [place] [address]   Blood culture (routine x 2)     Status: None (Preliminary result)   Collection  Time: 06/09/19  3:24 PM   Specimen: BLOOD  Result Value Ref Range Status   Specimen Description BLOOD BLOOD LEFT WRIST  Final   Special Requests   Final    BOTTLES DRAWN AEROBIC AND ANAEROBIC Blood Culture adequate volume   Culture   Final    NO GROWTH < 24 HOURS Performed at [facility], [address] [place], [place] [address]    Report Status PENDING  Incomplete  MRSA PCR Screening     Status: None   Collection Time: 06/09/19 11:59 PM   Specimen: Nasal Mucosa; Nasopharyngeal  Result Value Ref Range Status   MRSA by PCR NEGATIVE NEGATIVE Final    Comment:        The GeneXpert MRSA Assay (FDA approved for NASAL specimens only), is one component of a comprehensive MRSA colonization surveillance program. It is not intended to diagnose MRSA infection nor to guide or monitor treatment for MRSA infections. Performed at [facility], [address] [place], [place] [address]       IMAGING    Ct Head Wo Contrast  Result Date: 06/09/2019 CLINICAL DATA:  Altered mental status, unknown down time EXAM: CT HEAD WITHOUT CONTRAST CT CERVICAL SPINE WITHOUT CONTRAST TECHNIQUE: Multidetector CT imaging of the head and cervical spine was performed following the standard protocol without intravenous contrast. Multiplanar CT image reconstructions of the cervical spine were also generated. COMPARISON:  CT 10/24/2019, 07/04/2019 FINDINGS: CT HEAD FINDINGS Brain: No acute territorial infarction, hemorrhage, or intracranial mass. Chronic lacunar infarct left basal ganglia. Mild atrophy. Mild small vessel ischemic changes of the white matter. Vascular: No hyperdense vessels.  Carotid vascular calcification Skull: Left frontal and posterior parietal burr holes.  No fracture Sinuses/Orbits: Fluid level right maxillary sinus. Patchy mucosal thickening in the ethmoid sinuses Other: None CT CERVICAL SPINE FINDINGS Alignment: Straightening of the cervical spine. Facet alignment within  normal limits. Skull base and vertebrae: No acute fracture. No primary bone lesion or focal pathologic process. Soft tissues and spinal canal: No prevertebral fluid or swelling. No visible canal hematoma. Disc levels: Post fusion changes C5 through C7. Mild degenerative change C4-C5 and C7-T1. Prominent degenerative change at the C1-C2 articulation. Upper chest: Negative. Other: None IMPRESSION: 1. No CT evidence for acute intracranial abnormality. Atrophy and mild small vessel ischemic changes of the white matter. 2. Straightening of the cervical spine with fusion hardware C5 through C7. No acute osseous abnormality. Electronically Signed   By: Jasmine PangKim  Fujinaga M.D.   On: 06/09/2019 15:25   Ct Cervical Spine Wo Contrast  Result Date: 06/09/2019 CLINICAL DATA:  Altered mental status, unknown down time EXAM: CT HEAD WITHOUT CONTRAST CT CERVICAL SPINE WITHOUT CONTRAST TECHNIQUE: Multidetector CT imaging of the head and cervical spine was performed following the standard protocol without intravenous contrast. Multiplanar CT image reconstructions of the cervical spine were also generated. COMPARISON:  CT 10/24/2019, 07/04/2019 FINDINGS: CT HEAD FINDINGS Brain: No acute territorial infarction, hemorrhage, or intracranial mass. Chronic lacunar infarct left basal ganglia. Mild atrophy. Mild  small vessel ischemic changes of the white matter. Vascular: No hyperdense vessels.  Carotid vascular calcification Skull: Left frontal and posterior parietal burr holes.  No fracture Sinuses/Orbits: Fluid level right maxillary sinus. Patchy mucosal thickening in the ethmoid sinuses Other: None CT CERVICAL SPINE FINDINGS Alignment: Straightening of the cervical spine. Facet alignment within normal limits. Skull base and vertebrae: No acute fracture. No primary bone lesion or focal pathologic process. Soft tissues and spinal canal: No prevertebral fluid or swelling. No visible canal hematoma. Disc levels: Post fusion changes C5 through  C7. Mild degenerative change C4-C5 and C7-T1. Prominent degenerative change at the C1-C2 articulation. Upper chest: Negative. Other: None IMPRESSION: 1. No CT evidence for acute intracranial abnormality. Atrophy and mild small vessel ischemic changes of the white matter. 2. Straightening of the cervical spine with fusion hardware C5 through C7. No acute osseous abnormality. Electronically Signed   By: Donavan Foil M.D.   On: 06/09/2019 15:25   Dg Chest Portable 1 View  Result Date: 06/09/2019 CLINICAL DATA:  Mental status. EXAM: PORTABLE CHEST 1 VIEW COMPARISON:  02/18/2019. FINDINGS: Mediastinum hilar structures normal. Lungs are clear of acute infiltrates. Mild cardiomegaly. No pulmonary venous congestion. Metallic fragments again noted right chest. Prominent gastric distention. Cervical spine fusion. IMPRESSION: 1. Prominent gastric distention. 2. Borderline cardiomegaly. No pulmonary venous congestion. No focal pulmonary infiltrates. Electronically Signed   By: Marcello Moores  Register   On: 06/09/2019 15:12    Indwelling Urinary Catheter continued, requirement due to   Reason to continue Indwelling Urinary Catheter strict Intake/Output monitoring for hemodynamic instability    ASSESSMENT AND PLAN SYNOPSIS  58 y.o male with improving DKA and septic shock secondary to substance use and noncompliance.   DKA - Improving - Diabetic coordinator consulted - BiCarb D/C - IV Insulin infusion to be D/C today - Transition to Lantus   ACUTE KIDNEY INJURY/Renal Failure -follow chem 7 -follow UO -continue Foley Catheter-assess need -Avoid nephrotoxic agents  NEUROLOGY- Acute encephalopathy secondary to DKA, cocaine use, and possible sepsis - CIWA protocol    Possible Sepsis with leukocytosis and severe hypothermia - Improving - Hypothermia- resolved - use vasopressors PRN to keep MAP>65 - follow ABG and LA - follow up cultures  Anemia with no clear source of blood loss - Follow H/H - Monitor  for possible s/sx of bleeding - Transfuse for hbg <7  CARDIAC ICU monitoring  ID -continue IV abx as prescibed -follow up cultures  GI GI PROPHYLAXIS as indicated  NUTRITIONAL STATUS DIET-->NPO Constipation protocol as indicated  ENDO - will use ICU hypoglycemic\Hyperglycemia protocol if needed  ELECTROLYTES -follow labs as needed -replace as needed -pharmacy consultation and following  DVT/GI PRX ordered TRANSFUSIONS AS NEEDED MONITOR FSBS ASSESS the need for LABS    Critical Care Time devoted to patient care services described in this note is 35  minutes.     Corrin Parker, M.D.  [facility]  Medical Director [place] Director [facility] Cardio-Pulmonary Department

## 2019-06-10 NOTE — Progress Notes (Addendum)
Inpatient Diabetes Program Recommendations  AACE/ADA: New Consensus Statement on Inpatient Glycemic Control   Target Ranges:  Prepandial:   less than 140 mg/dL      Peak postprandial:   less than 180 mg/dL (1-2 hours)      Critically ill patients:  140 - 180 mg/dL   Results for Spencer [name], Spencer [name] (MRN [id number]) as of 06/10/2019 08:17  Ref. Range 06/09/2019 23:58 06/10/2019 01:07 06/10/2019 02:08 06/10/2019 03:24 06/10/2019 04:31 06/10/2019 05:46 06/10/2019 06:46 06/10/2019 07:32  Glucose-Capillary Latest Ref Range: 70 - 99 mg/dL >600 (HH) 538 (HH) 577 (HH) 468 (H) 385 (H) 351 (H) 328 (H) 272 (H)  Results for Spencer [name], Spencer [name] (MRN [id number]) as of 06/10/2019 08:17  Ref. Range 06/09/2019 16:04  Beta-Hydroxybutyric Acid Latest Ref Range: 0.05 - 0.27 mmol/L >8.00 (H)  Glucose Latest Ref Range: 70 - 99 mg/dL 1,228 [facility])  Results for Spencer [name], Spencer [name] (MRN [id number]) as of 06/10/2019 08:17  Ref. Range 01/06/2019 17:30 05/28/2019 16:38 05/30/2019 04:46  Hemoglobin A1C Latest Ref Range: 4.8 - 5.6 % 11.3 (H) 13.5 (H) 13.8 (H)   Review of Glycemic Control  Outpatient Diabetes medications:Lantus 24-40 units BID, Novolog 6-25 units TID with meals Current orders for Inpatient glycemic control: IV insulin per DKA  Inpatient Diabetes Program Recommendations:   At time of transition from IV to SQ insulin: Once acidosis is cleared and MD is ready to transition from IV to SQ insulin, please consider ordering Lantus 25 units Q24H, CBGs Q4H, Novolog 0-9 units Q4H, and when diet ordered Novolog 5 units TID with meals for meal coverage if patient eats at least 50% of meals.  NOTE: Noted patient admitted again with DKA; was just inpatient 05/28/19 to 06/04/19 with noted DKA. Patient has had multiple admissions and has been seen by Inpatient Diabetes team many times. Diabetes Coordinator spoke with patient on 06/02/19 during last hospitalization. Patient reports that he has everything needed at home for DM management. Noted patient cocaine positive  which may be effecting patient's ability and willingness to manage DM. Transition recommendations made above. Will follow along while inpatient.  Thanks, Barnie Alderman, RN, MSN, CDE Diabetes Coordinator Inpatient Diabetes Program [phone] (Team Pager from 8am to 5pm)

## 2019-06-10 NOTE — Progress Notes (Addendum)
1        [facility] [place] [facility]   PATIENT NAME: Spencer [name]    MR#:  [id number]  DATE OF BIRTH:  12/12/1960  SUBJECTIVE:  CHIEF COMPLAINT:   Chief Complaint  Patient presents with  . unresponsive  Awake and wanting to eat,  some abdominal pain REVIEW OF SYSTEMS:  Review of Systems  Constitutional: Negative for diaphoresis, fever, malaise/fatigue and weight loss.  HENT: Negative for ear discharge, ear pain, hearing loss, nosebleeds, sore throat and tinnitus.   Eyes: Negative for blurred vision and pain.  Respiratory: Negative for cough, hemoptysis, shortness of breath and wheezing.   Cardiovascular: Negative for chest pain, palpitations, orthopnea and leg swelling.  Gastrointestinal: Positive for abdominal pain. Negative for blood in stool, constipation, diarrhea, heartburn, nausea and vomiting.  Genitourinary: Negative for dysuria, frequency and urgency.  Musculoskeletal: Negative for back pain and myalgias.  Skin: Negative for itching and rash.  Neurological: Negative for dizziness, tingling, tremors, focal weakness, seizures, weakness and headaches.  Psychiatric/Behavioral: Negative for depression. The patient is not nervous/anxious.     DRUG ALLERGIES:  No Known Allergies VITALS:  Blood pressure 135/80, pulse 65, temperature 97.6 F (36.4 C), temperature source Axillary, resp. rate 13, height 5\' 6"  (1.676 m), weight 66.6 kg, SpO2 99 %. PHYSICAL EXAMINATION:  Physical Exam Constitutional:      Appearance: He is cachectic.  HENT:     Head: Normocephalic and atraumatic.  Eyes:     Conjunctiva/sclera: Conjunctivae normal.     Pupils: Pupils are equal, round, and reactive to light.  Neck:     Musculoskeletal: Normal range of motion and neck supple.     Thyroid: No thyromegaly.     Trachea: No tracheal deviation.  Cardiovascular:     Rate and Rhythm: Normal rate and regular rhythm.     Heart sounds: Normal heart sounds.  Pulmonary:   Effort: Pulmonary effort is normal. No respiratory distress.     Breath sounds: Normal breath sounds. No wheezing.  Chest:     Chest wall: No tenderness.  Abdominal:     General: Bowel sounds are normal. There is no distension.     Palpations: Abdomen is soft.     Tenderness: There is no abdominal tenderness.  Musculoskeletal: Normal range of motion.  Skin:    General: Skin is warm and dry.     Findings: No rash.  Neurological:     Mental Status: He is alert and oriented to person, place, and time.     Cranial Nerves: No cranial nerve deficit.    LABORATORY PANEL:  Male CBC Recent Labs  Lab 06/10/19 0456  WBC 16.6*  HGB 7.7*  HCT 22.9*  PLT 422*   ------------------------------------------------------------------------------------------------------------------ Chemistries  Recent Labs  Lab 06/09/19 1604  06/10/19 0732  NA 132*   < > 146*  K 6.0*   < > 3.1*  CL 94*   < > 111  CO2 <7*   < > 23  GLUCOSE 1,228*   < > 294*  BUN 50*   < > 44*  CREATININE 3.82*   < > 2.80*  CALCIUM 8.1*   < > 7.3*  AST 17  --   --   ALT 14  --   --   ALKPHOS 96  --   --   BILITOT 1.5*  --   --    < > = values in this interval not displayed.   RADIOLOGY:  No results  found. ASSESSMENT AND PLAN:   1.  Severe sepsis -  On sepsis protocol -Telemetry monitoring - Blood and urine cultures are pending - IV cefepime - Vasopressor therapy is being weaned as patient tolerates -Repeat CBC and BMP in the a.m.  2.  DKA - Patient received fluid bolus as above with bicarbonate infusion - Insulin infusion per DKA protocol with serial labs per protocol  3.  Altered mental status - Likely secondary to DKA and/or sepsis  4. history of substance abuse with urine drug screen positive for cocaine metabolites - We will consult social service for possible rehabilitation assistance  5. hyperkalemia -  Resolved now  6.  Anemia: Likely of chronic disease, monitor H&H   All the records are  reviewed and case discussed with Care Management/Social Worker. Management plans discussed with the patient, nursing and they are in agreement.  CODE STATUS: Full Code  TOTAL TIME TAKING CARE OF THIS PATIENT: 15 minutes.   More than 50% of the time was spent in counseling/coordination of care: YES  POSSIBLE D/C IN 2-3 DAYS, DEPENDING ON CLINICAL CONDITION.   Max Sane M.D on 06/10/2019 at 5:16 PM  Between 7am to 6pm - Pager - [phone]  After 6pm go to [email or web address] - Proofreader  [facility] [facility]  Office  [phone]  CC: Primary care physician; Jodi Marble, MD  Note: This dictation was prepared with Dragon dictation along with smaller phrase technology. Any transcriptional errors that result from this process are unintentional.

## 2019-06-10 NOTE — Progress Notes (Signed)
PHARMACY CONSULT NOTE - FOLLOW UP  Pharmacy Consult for Electrolyte Monitoring and Replacement   Recent Labs: Potassium (mmol/L)  Date Value  06/10/2019 3.1 (L)   Magnesium (mg/dL)  Date Value  06/03/2019 2.3   Calcium (mg/dL)  Date Value  06/10/2019 7.3 (L)   Albumin (g/dL)  Date Value  06/09/2019 2.6 (L)   Phosphorus (mg/dL)  Date Value  06/02/2019 3.3   Sodium (mmol/L)  Date Value  06/10/2019 146 (H)     Assessment: 58 year old male admitted with DKA on insulin drip. Now has transitioned to SQ insulin. Pharmacy consulted to replace electrolytes.  Goal of Therapy:  Electrolytes WNL  Plan:  Patient received potassium 10 mEq IV x 2. Order for 10 mEq IV x 4 for a total of 60 mEq.  Will order electrolytes with am labs.  Pharmacy to continue following and replace electrolytes as indicated.  Tawnya Crook ,PharmD Clinical Pharmacist 06/10/2019 2:38 PM

## 2019-06-11 LAB — GLUCOSE, CAPILLARY
Glucose-Capillary: 113 mg/dL — ABNORMAL HIGH (ref 70–99)
Glucose-Capillary: 115 mg/dL — ABNORMAL HIGH (ref 70–99)
Glucose-Capillary: 134 mg/dL — ABNORMAL HIGH (ref 70–99)
Glucose-Capillary: 347 mg/dL — ABNORMAL HIGH (ref 70–99)
Glucose-Capillary: 350 mg/dL — ABNORMAL HIGH (ref 70–99)
Glucose-Capillary: 390 mg/dL — ABNORMAL HIGH (ref 70–99)

## 2019-06-11 LAB — BASIC METABOLIC PANEL
Anion gap: 7 (ref 5–15)
BUN: 27 mg/dL — ABNORMAL HIGH (ref 6–20)
CO2: 27 mmol/L (ref 22–32)
Calcium: 7.8 mg/dL — ABNORMAL LOW (ref 8.9–10.3)
Chloride: 116 mmol/L — ABNORMAL HIGH (ref 98–111)
Creatinine, Ser: 1.56 mg/dL — ABNORMAL HIGH (ref 0.61–1.24)
GFR calc Af Amer: 55 mL/min — ABNORMAL LOW (ref >60)
GFR calc non Af Amer: 48 mL/min — ABNORMAL LOW (ref >60)
Glucose, Bld: 127 mg/dL — ABNORMAL HIGH (ref 70–99)
Potassium: 2.9 mmol/L — ABNORMAL LOW (ref 3.5–5.1)
Sodium: 150 mmol/L — ABNORMAL HIGH (ref 135–145)

## 2019-06-11 LAB — CBC
HCT: 24.2 % — ABNORMAL LOW (ref 39.0–52.0)
Hemoglobin: 8.4 g/dL — ABNORMAL LOW (ref 13.0–17.0)
MCH: 31.9 pg (ref 26.0–34.0)
MCHC: 34.7 g/dL (ref 30.0–36.0)
MCV: 92 fL (ref 80.0–100.0)
Platelets: 379 10*3/uL (ref 150–400)
RBC: 2.63 MIL/uL — ABNORMAL LOW (ref 4.22–5.81)
RDW: 12.8 % (ref 11.5–15.5)
WBC: 11.4 10*3/uL — ABNORMAL HIGH (ref 4.0–10.5)
nRBC: 0 % (ref 0.0–0.2)

## 2019-06-11 LAB — URINE CULTURE
Culture: NO GROWTH
Culture: NO GROWTH
Special Requests: NORMAL

## 2019-06-11 LAB — MAGNESIUM: Magnesium: 1.9 mg/dL (ref 1.7–2.4)

## 2019-06-11 LAB — PHOSPHORUS: Phosphorus: 1.8 mg/dL — ABNORMAL LOW (ref 2.5–4.6)

## 2019-06-11 LAB — POTASSIUM: Potassium: 3.2 mmol/L — ABNORMAL LOW (ref 3.5–5.1)

## 2019-06-11 MED ORDER — INSULIN ASPART 100 UNIT/ML ~~LOC~~ SOLN
6.0000 [IU] | Freq: Three times a day (TID) | SUBCUTANEOUS | Status: DC
  Administered 2019-06-11 – 2019-06-12 (×3): 6 [IU] via SUBCUTANEOUS
  Filled 2019-06-11 (×2): qty 1

## 2019-06-11 MED ORDER — POTASSIUM CHLORIDE CRYS ER 20 MEQ PO TBCR
40.0000 meq | EXTENDED_RELEASE_TABLET | Freq: Once | ORAL | Status: AC
  Administered 2019-06-11: 40 meq via ORAL
  Filled 2019-06-11: qty 2

## 2019-06-11 MED ORDER — POTASSIUM CHLORIDE 10 MEQ/100ML IV SOLN
10.0000 meq | INTRAVENOUS | Status: AC
  Administered 2019-06-11 (×4): 10 meq via INTRAVENOUS
  Filled 2019-06-11 (×4): qty 100

## 2019-06-11 MED ORDER — INSULIN ASPART 100 UNIT/ML ~~LOC~~ SOLN
0.0000 [IU] | Freq: Three times a day (TID) | SUBCUTANEOUS | Status: DC
  Administered 2019-06-11: 12:00:00 7 [IU] via SUBCUTANEOUS
  Administered 2019-06-11: 9 [IU] via SUBCUTANEOUS
  Filled 2019-06-11 (×2): qty 1

## 2019-06-11 MED ORDER — SODIUM CHLORIDE 0.9 % IV SOLN
2.0000 g | Freq: Two times a day (BID) | INTRAVENOUS | Status: DC
  Administered 2019-06-11 (×2): 2 g via INTRAVENOUS
  Filled 2019-06-11 (×4): qty 2

## 2019-06-11 MED ORDER — INSULIN ASPART 100 UNIT/ML ~~LOC~~ SOLN
0.0000 [IU] | Freq: Three times a day (TID) | SUBCUTANEOUS | Status: DC
  Administered 2019-06-11: 7 [IU] via SUBCUTANEOUS
  Administered 2019-06-12 (×2): 3 [IU] via SUBCUTANEOUS
  Filled 2019-06-11 (×2): qty 1

## 2019-06-11 MED ORDER — POTASSIUM PHOSPHATES 15 MMOLE/5ML IV SOLN
20.0000 mmol | Freq: Once | INTRAVENOUS | Status: AC
  Administered 2019-06-11: 09:00:00 20 mmol via INTRAVENOUS
  Filled 2019-06-11: qty 6.67

## 2019-06-11 NOTE — Progress Notes (Signed)
PHARMACY CONSULT NOTE - FOLLOW UP  Pharmacy Consult for Electrolyte Monitoring and Replacement   Recent Labs: Potassium (mmol/L)  Date Value  06/11/2019 2.9 (L)   Magnesium (mg/dL)  Date Value  06/11/2019 1.9   Calcium (mg/dL)  Date Value  06/11/2019 7.8 (L)   Albumin (g/dL)  Date Value  06/09/2019 2.6 (L)   Phosphorus (mg/dL)  Date Value  06/11/2019 1.8 (L)   Sodium (mmol/L)  Date Value  06/11/2019 150 (H)     Assessment: 58 year old male admitted with DKA on insulin drip. Now has transitioned to SQ insulin. Pharmacy consulted to replace electrolytes.  Scr improving 3.74>2.80>1.56  K 2.9, Mg 1.9, Phos 1.8  Goal of Therapy:  Electrolytes WNL  Plan:  Will give Potassium Phosphate IV 20 mmol and  Will give KCl IV 70meq x 4  No Magnesium replenishment warranted at this time  Will order electrolytes with am labs (will recheck K @ 1800 per protocol).  Pharmacy to continue following and replace electrolytes as indicated.   Lu Duffel, PharmD, BCPS Clinical Pharmacist 06/11/2019 7:15 AM

## 2019-06-11 NOTE — Evaluation (Signed)
Physical Therapy Evaluation Patient Details Name: Spencer [name] MRN: [id number] DOB: 1960/11/29 Today's Date: 06/11/2019   History of Present Illness  Patient is a 58 year old male admitted after being found unresponsive. Patient has h/o cocaine use, DM, non-compliance.  Clinical Impression  Patient requires multiple re-attempt for him to participate with therapy evaluation. States "go away and leave me alone." Reluctantly agrees to demonstrate mobility. Patient is independent with bed mobility, transfers with supervision, he is able to ambulate 200 feet in hall without ad. No LOB, no difficulty reported. Patient appears to be at baseline level of functioning and does not require PT follow up at this time.       Follow Up Recommendations No PT follow up    Equipment Recommendations  None recommended by PT    Recommendations for Other Services       Precautions / Restrictions Precautions Precautions: Fall Restrictions Weight Bearing Restrictions: No      Mobility  Bed Mobility Overal bed mobility: Independent                Transfers Overall transfer level: Independent                  Ambulation/Gait Ambulation/Gait assistance: Supervision Gait Distance (Feet): 200 Feet Assistive device: None Gait Pattern/deviations: WFL(Within Functional Limits)        Stairs            Wheelchair Mobility    Modified Rankin (Stroke Patients Only)       Balance Overall balance assessment: No apparent balance deficits (not formally assessed)                                           Pertinent Vitals/Pain Pain Assessment: No/denies pain    Home Living Family/patient expects to be discharged to:: Private residence Living Arrangements: Non-relatives/Friends   Type of Home: House       Home Layout: One level Home Equipment: None      Prior Function Level of Independence: Independent               Hand Dominance         Extremity/Trunk Assessment   Upper Extremity Assessment Upper Extremity Assessment: Overall WFL for tasks assessed    Lower Extremity Assessment Lower Extremity Assessment: Overall WFL for tasks assessed       Communication   Communication: No difficulties  Cognition Arousal/Alertness: Awake/alert Behavior During Therapy: WFL for tasks assessed/performed Overall Cognitive Status: Within Functional Limits for tasks assessed                                 General Comments: decreased cooperation      General Comments      Exercises     Assessment/Plan    PT Assessment Patent does not need any further PT services  PT Problem List         PT Treatment Interventions      PT Goals (Current goals can be found in the Care Plan section)  Acute Rehab PT Goals Patient Stated Goal: none stated PT Goal Formulation: With patient    Frequency     Barriers to discharge        Co-evaluation               AM-PAC  PT "6 Clicks" Mobility  Outcome Measure Help needed turning from your back to your side while in a flat bed without using bedrails?: None Help needed moving from lying on your back to sitting on the side of a flat bed without using bedrails?: None Help needed moving to and from a bed to a chair (including a wheelchair)?: None Help needed standing up from a chair using your arms (e.g., wheelchair or bedside chair)?: None Help needed to walk in hospital room?: None Help needed climbing 3-5 steps with a railing? : None 6 Click Score: 24    End of Session Equipment Utilized During Treatment: Gait belt Activity Tolerance: Patient tolerated treatment well Patient left: in bed;with bed alarm set;with call bell/phone within reach Nurse Communication: Mobility status PT Visit Diagnosis: Difficulty in walking, not elsewhere classified (R26.2)    Time: [id number] PT Time Calculation (min) (ACUTE ONLY): 15 min   Charges:   PT Evaluation $PT Eval  Low Complexity: 1 Low PT Treatments $Gait Training: 8-22 mins        Issaac Shipper, PT, GCS 06/11/19,4:20 PM

## 2019-06-11 NOTE — TOC Initial Note (Signed)
Transition of Care [facility]) - Initial/Assessment Note    Patient Details  Name: Spencer [name] MRN: [id number] Date of Birth: [birth year]  [facility] CM/SW Contact:    Shelbie Hutching, RN Phone Number: 06/11/2019, 9:33 AM  Clinical Narrative:                 Patient admitted with DKA and sepsis.  Patient was brought in to the emergency room unresponsive by EMS.  Frequent admissions, recently discharged 7/29.  Patient positive for cocaine at admission.  RNCM attempted to speak with patient but he requests that RNCM come back at a later time.  RNCM will follow up with patient this afternoon.   Expected Discharge Plan: Home/Self Care Barriers to Discharge: Continued Medical Work up   Patient Goals and CMS Choice        Expected Discharge Plan and Services Expected Discharge Plan: Home/Self Care   Discharge Planning Services: CM Consult                                          Prior Living Arrangements/Services     Patient language and need for interpreter reviewed:: No              Criminal Activity/Legal Involvement Pertinent to Current Situation/Hospitalization: No - Comment as needed  Activities of Daily Living      Permission Sought/Granted                  Emotional Assessment Appearance:: Appears stated age Attitude/Demeanor/Rapport: Engaged Affect (typically observed): Accepting Orientation: : Oriented to Self, Oriented to Place, Oriented to  Time, Oriented to Situation Alcohol / Substance Use: Illicit Drugs Psych Involvement: No (comment)  Admission diagnosis:  Hypothermia, initial encounter [T68.XXXA] Diabetic ketoacidosis with coma associated with type 1 diabetes mellitus ([place]) [E10.11] Sepsis, due to unspecified organism, unspecified whether acute organ dysfunction present [facility]) [A41.9] Patient Active Problem List   Diagnosis Date Noted  . Sepsis ([place]) 06/09/2019  . DKA (diabetic ketoacidoses) ([place]) 05/28/2019  . AKI (acute  kidney injury) ([place]) 02/19/2019  . Hypoglycemia 10/23/2018  . Chronic low back pain 01/16/2017  . Chronic neck pain 01/16/2017  . Diabetic neuropathy ([place]) 01/16/2017  . DM2 (diabetes mellitus, type 2) ([place]) 01/16/2017  . History of cocaine abuse ([place]) 01/16/2017  . Personal history of subdural hematoma 01/16/2017  . Closed displaced fracture of body of left calcaneus with delayed healing 12/13/2016  . Protein-calorie malnutrition, severe 11/08/2016  . DKA, type 2 ([place]) 11/06/2016  . Diabetic ketoacidosis ([place]) 04/03/2015  . DKA (diabetic ketoacidoses) ([place]) 04/03/2015  . Hypertension 04/03/2015  . Depression 04/03/2015  . Opiate dependence ([place]) 04/03/2015  . Tobacco abuse 04/03/2015  . Lumbosacral neuritis 07/19/2014  . Pain of finger of right hand 07/19/2014  . Type II or unspecified type diabetes mellitus without mention of complication, not stated as uncontrolled 07/19/2014  . Knee pain 09/12/2013  . Hernia of flank 09/20/2012  . Epidermoid cyst of skin 09/20/2012  . Chronic pain of both shoulders 03/27/2012   PCP:  Jodi Marble, MD Pharmacy:   [facility] [place], [place] - [address] AT [facility] [address] [place] [address] Phone: [phone] Fax: [phone]     Social Determinants of Health (SDOH) Interventions    Readmission Risk Interventions Readmission Risk Prevention Plan 02/19/2019 02/13/2019 02/10/2019  Transportation  Screening Complete Complete Complete  PCP or Specialist Appt within 3-5 Days Complete - -  HRI or Home Care Consult Not Complete - -  HRI or Home Care Consult comments Patient does not meet homebound status - -  Palliative Care Screening Not Applicable - -  Medication Review (RN Care Manager) Complete Complete -  PCP or Specialist appointment within 3-5 days of discharge - (No Data) -  HRI or Home Care Consult - - Not Complete  SW Recovery Care/Counseling Consult - - Not Complete  SW Consult Not Complete  Comments - - not indicated  Palliative Care Screening - - Not Applicable  Skilled Nursing Facility - - Not Applicable  Some recent data might be hidden

## 2019-06-11 NOTE — Progress Notes (Signed)
1        [employer] [facility] [place] [facility]   PATIENT NAME: Spencer [name]    MR#:  [id number]  DATE OF BIRTH:  04/14/1961  SUBJECTIVE:  CHIEF COMPLAINT:   Chief Complaint  Patient presents with  . unresponsive  Feels better but still very weak, could not tell me why he lost consciousness at home REVIEW OF SYSTEMS:  Review of Systems  Constitutional: Negative for diaphoresis, fever, malaise/fatigue and weight loss.  HENT: Negative for ear discharge, ear pain, hearing loss, nosebleeds, sore throat and tinnitus.   Eyes: Negative for blurred vision and pain.  Respiratory: Negative for cough, hemoptysis, shortness of breath and wheezing.   Cardiovascular: Negative for chest pain, palpitations, orthopnea and leg swelling.  Gastrointestinal: Positive for abdominal pain. Negative for blood in stool, constipation, diarrhea, heartburn, nausea and vomiting.  Genitourinary: Negative for dysuria, frequency and urgency.  Musculoskeletal: Negative for back pain and myalgias.  Skin: Negative for itching and rash.  Neurological: Negative for dizziness, tingling, tremors, focal weakness, seizures, weakness and headaches.  Psychiatric/Behavioral: Negative for depression. The patient is not nervous/anxious.     DRUG ALLERGIES:  No Known Allergies VITALS:  Blood pressure (!) 147/77, pulse 64, temperature 97.7 F (36.5 C), temperature source Oral, resp. rate 15, height 5\' 6"  (1.676 m), weight 66.6 kg, SpO2 99 %. PHYSICAL EXAMINATION:  Physical Exam Constitutional:      Appearance: He is cachectic.  HENT:     Head: Normocephalic and atraumatic.  Eyes:     Conjunctiva/sclera: Conjunctivae normal.     Pupils: Pupils are equal, round, and reactive to light.  Neck:     Musculoskeletal: Normal range of motion and neck supple.     Thyroid: No thyromegaly.     Trachea: No tracheal deviation.  Cardiovascular:     Rate and Rhythm: Normal rate and regular rhythm.     Heart sounds:  Normal heart sounds.  Pulmonary:     Effort: Pulmonary effort is normal. No respiratory distress.     Breath sounds: Normal breath sounds. No wheezing.  Chest:     Chest wall: No tenderness.  Abdominal:     General: Bowel sounds are normal. There is no distension.     Palpations: Abdomen is soft.     Tenderness: There is no abdominal tenderness.  Musculoskeletal: Normal range of motion.  Skin:    General: Skin is warm and dry.     Findings: No rash.  Neurological:     Mental Status: He is alert and oriented to person, place, and time.     Cranial Nerves: No cranial nerve deficit.    LABORATORY PANEL:  Male CBC Recent Labs  Lab 06/11/19 0411  WBC 11.4*  HGB 8.4*  HCT 24.2*  PLT 379   ------------------------------------------------------------------------------------------------------------------ Chemistries  Recent Labs  Lab 06/09/19 1604  06/11/19 0411  NA 132*   < > 150*  K 6.0*   < > 2.9*  CL 94*   < > 116*  CO2 <7*   < > 27  GLUCOSE 1,228*   < > 127*  BUN 50*   < > 27*  CREATININE 3.82*   < > 1.56*  CALCIUM 8.1*   < > 7.8*  MG  --   --  1.9  AST 17  --   --   ALT 14  --   --   ALKPHOS 96  --   --   BILITOT 1.5*  --   --    < > =  values in this interval not displayed.   RADIOLOGY:  No results found. ASSESSMENT AND PLAN:   1.  Severe sepsis -  Present on admission.  No obvious source of infection found we will stop antibiotics -Could be viral in nature  2.  DKA -  Resolved on sliding scale insulin now, along with insulin Lantus 25 units subcu daily  3.  Altered mental status - Likely secondary to DKA and/or sepsis -Seems close to his baseline but not quite completely normal  4. history of substance abuse with urine drug screen positive for cocaine metabolites - consult social service for possible rehabilitation assistance.  Awaiting physical therapy evaluation  5.   Hypokalemia -Replete and recheck  6.  Anemia: Likely of chronic disease,  monitor H&H  7.  Acute renal failure: Likely prerenal, improving with hydration   All the records are reviewed and case discussed with Care Management/Social Worker. Management plans discussed with the patient, nursing and they are in agreement.  CODE STATUS: Full Code  TOTAL TIME TAKING CARE OF THIS PATIENT: 15 minutes.   More than 50% of the time was spent in counseling/coordination of care: YES  POSSIBLE D/C IN 1-2 DAYS, DEPENDING ON CLINICAL CONDITION.   Max Sane M.D on 06/11/2019 at 12:57 PM  Between 7am to 6pm - Pager - [phone]  After 6pm go to [email or web address] - Proofreader  [employer]  Hospitalists  Office  [phone]  CC: Primary care physician; Jodi Marble, MD  Note: This dictation was prepared with Dragon dictation along with smaller phrase technology. Any transcriptional errors that result from this process are unintentional.

## 2019-06-11 NOTE — Plan of Care (Signed)

## 2019-06-11 NOTE — Progress Notes (Signed)
Pharmacy Antibiotic Note  Spencer [name] is a 58 y.o. male admitted on 06/09/2019 with sepsis.  Pharmacy has been consulted for Cefepime dosing (Vancomycin d/c'ed)  Plan: Pt CrCl improving and Cefepime dose needs to be adjusted to   Cefepime 2g IV q12h  Height: 5\' 6"  (167.6 cm) Weight: 146 lb 13.2 oz (66.6 kg) IBW/kg (Calculated) : 63.8  Temp (24hrs), Avg:97.6 F (36.4 C), Min:97.5 F (36.4 C), Max:97.7 F (36.5 C)  Recent Labs  Lab 06/09/19 1519  06/09/19 1604 06/09/19 1818 06/10/19 0012 06/10/19 0456 06/10/19 0732 06/10/19 1935 06/10/19 2225 06/11/19 0411  WBC  --   --  17.6*  --   --  16.6*  --   --   --  11.4*  CREATININE  --    < > 3.82* 3.74* 3.52* 2.99* 2.80*  --   --  1.56*  LATICACIDVEN 4.8*  --   --  3.1*  --   --   --  0.8 1.0  --    < > = values in this interval not displayed.    Estimated Creatinine Clearance: 45.4 mL/min (A) (by C-G formula based on SCr of 1.56 mg/dL (H)).    No Known Allergies  Antimicrobials this admission: Cefepime 8/3 >>  Fluconazole 8/4 >> Vancomycin/Metronidazole 8/3>8/3   Dose adjustments this admission: Cefpime 2g q24 adj to Cefepime 2g q12h based on improved CrCl  Microbiology results: BCx: pending UCx: pending  COVID NEG  MRSA PCR: NEG  Thank you for allowing pharmacy to be a part of this patient's care.  Lu Duffel, PharmD, BCPS Clinical Pharmacist 06/11/2019 7:38 AM

## 2019-06-11 NOTE — Progress Notes (Signed)
Inpatient Diabetes Program Recommendations  AACE/ADA: New Consensus Statement on Inpatient Glycemic Control  Target Ranges:  Prepandial:   less than 140 mg/dL      Peak postprandial:   less than 180 mg/dL (1-2 hours)      Critically ill patients:  140 - 180 mg/dL  Results for RAJENDRA, SPILLER (MRN [id number]) as of 06/11/2019 13:58  Ref. Range 06/11/2019 00:00 06/11/2019 03:49 06/11/2019 07:38 06/11/2019 11:40  Glucose-Capillary Latest Ref Range: 70 - 99 mg/dL 113 (H) 115 (H) 134 (H) 347 (H)   Results for AARISH, ROCKERS (MRN [id number]) as of 06/11/2019 13:58  Ref. Range 06/10/2019 09:46 06/10/2019 10:47 06/10/2019 11:33 06/10/2019 12:25 06/10/2019 13:28 06/10/2019 14:43 06/10/2019 15:31 06/10/2019 20:14  Glucose-Capillary Latest Ref Range: 70 - 99 mg/dL 164 (H) 159 (H) 129 (H) 123 (H) 121 (H) 125 (H) 141 (H) 142 (H)   Review of Glycemic Control  Diabetes history: DM2 Outpatient Diabetes medications: Lantus 24-40 units BID, Novolog 6-25 units TID with meals Current orders for Inpatient glycemic control: Lantus 25 units daily, Novololg 0-9 units TID with meals  Inpatient Diabetes Program Recommendations:   Insulin-Meal Coverage: Please consider ordering Novolog 6 units TID with meals for meal coverage if patient eats at least 50% of meals.  Insulin-Correction: Please consider ordering Novolog 0-5 units QHS for bedtime correction.  Thanks, Barnie Alderman, RN, MSN, CDE Diabetes Coordinator Inpatient Diabetes Program [phone] (Team Pager from 8am to 5pm)

## 2019-06-11 NOTE — Progress Notes (Signed)
PHARMACY CONSULT NOTE - FOLLOW UP  Pharmacy Consult for Electrolyte Monitoring and Replacement   Recent Labs: Potassium (mmol/L)  Date Value  06/11/2019 3.2 (L)   Magnesium (mg/dL)  Date Value  06/11/2019 1.9   Calcium (mg/dL)  Date Value  06/11/2019 7.8 (L)   Albumin (g/dL)  Date Value  06/09/2019 2.6 (L)   Phosphorus (mg/dL)  Date Value  06/11/2019 1.8 (L)   Sodium (mmol/L)  Date Value  06/11/2019 150 (H)     Assessment: 58 year old male admitted with DKA on insulin drip. Now has transitioned to SQ insulin. Pharmacy consulted to replace electrolytes.  Scr improving 3.74>2.80>1.56  K 2.9, Mg 1.9, Phos 1.8  Goal of Therapy:  Electrolytes WNL  Plan:  Will give KCl 40 mEq x 1 PO.   No Magnesium replenishment warranted at this time  Will order electrolytes with am labs   Pharmacy to continue following and replace electrolytes as indicated.   Oswald Hillock, PharmD, BCPS Clinical Pharmacist 06/11/2019 7:59 PM

## 2019-06-11 NOTE — Progress Notes (Signed)
PT Cancellation Note  Patient Details Name: Spencer [name] MRN: [id number] DOB: 07/28/1961   Cancelled Treatment:    Reason Eval/Treat Not Completed: Patient declined, no reason specified. Patient said it was too early for him to be up. He stated that he was not awake yet.    Alanson Puls, [place] DPT 06/11/2019, 9:31 AM

## 2019-06-12 LAB — BASIC METABOLIC PANEL
Anion gap: 7 (ref 5–15)
BUN: 22 mg/dL — ABNORMAL HIGH (ref 6–20)
CO2: 26 mmol/L (ref 22–32)
Calcium: 7.7 mg/dL — ABNORMAL LOW (ref 8.9–10.3)
Chloride: 109 mmol/L (ref 98–111)
Creatinine, Ser: 1.12 mg/dL (ref 0.61–1.24)
GFR calc Af Amer: >60 mL/min (ref >60)
GFR calc non Af Amer: >60 mL/min (ref >60)
Glucose, Bld: 201 mg/dL — ABNORMAL HIGH (ref 70–99)
Potassium: 3.3 mmol/L — ABNORMAL LOW (ref 3.5–5.1)
Sodium: 142 mmol/L (ref 135–145)

## 2019-06-12 LAB — PHOSPHORUS: Phosphorus: 1.8 mg/dL — ABNORMAL LOW (ref 2.5–4.6)

## 2019-06-12 LAB — BLOOD GAS, VENOUS
Patient temperature: 37
pCO2, Ven: <19 mmHg — CL (ref 44.0–60.0)
pH, Ven: <6.9 — CL (ref 7.250–7.430)
pO2, Ven: 62 mmHg — ABNORMAL HIGH (ref 32.0–45.0)

## 2019-06-12 LAB — CBC
HCT: 23 % — ABNORMAL LOW (ref 39.0–52.0)
Hemoglobin: 7.8 g/dL — ABNORMAL LOW (ref 13.0–17.0)
MCH: 31.2 pg (ref 26.0–34.0)
MCHC: 33.9 g/dL (ref 30.0–36.0)
MCV: 92 fL (ref 80.0–100.0)
Platelets: 284 10*3/uL (ref 150–400)
RBC: 2.5 MIL/uL — ABNORMAL LOW (ref 4.22–5.81)
RDW: 12.8 % (ref 11.5–15.5)
WBC: 6.2 10*3/uL (ref 4.0–10.5)
nRBC: 0 % (ref 0.0–0.2)

## 2019-06-12 LAB — GLUCOSE, CAPILLARY
Glucose-Capillary: 225 mg/dL — ABNORMAL HIGH (ref 70–99)
Glucose-Capillary: 243 mg/dL — ABNORMAL HIGH (ref 70–99)

## 2019-06-12 LAB — MAGNESIUM: Magnesium: 1.7 mg/dL (ref 1.7–2.4)

## 2019-06-12 MED ORDER — POTASSIUM PHOSPHATE MONOBASIC 500 MG PO TABS
1000.0000 mg | ORAL_TABLET | ORAL | Status: DC
  Administered 2019-06-12 (×2): 1000 mg via ORAL
  Filled 2019-06-12 (×4): qty 2

## 2019-06-12 MED ORDER — MAGNESIUM SULFATE 2 GM/50ML IV SOLN
2.0000 g | Freq: Once | INTRAVENOUS | Status: AC
  Administered 2019-06-12: 09:00:00 2 g via INTRAVENOUS
  Filled 2019-06-12: qty 50

## 2019-06-12 NOTE — Progress Notes (Signed)
PHARMACY CONSULT NOTE - FOLLOW UP  Pharmacy Consult for Electrolyte Monitoring and Replacement   Recent Labs: Potassium (mmol/L)  Date Value  06/12/2019 3.3 (L)   Magnesium (mg/dL)  Date Value  06/12/2019 1.7   Calcium (mg/dL)  Date Value  06/12/2019 7.7 (L)   Albumin (g/dL)  Date Value  06/09/2019 2.6 (L)   Phosphorus (mg/dL)  Date Value  06/12/2019 1.8 (L)   Sodium (mmol/L)  Date Value  06/12/2019 142     Assessment: 58 year old male admitted with DKA on insulin drip. Now has transitioned to SQ insulin. Pharmacy consulted to replace electrolytes.  Scr improving 3.74>2.80>1.56>1.12  K 3.3, Mg 1.7, Phos 1.8  Goal of Therapy:  Electrolytes WNL  Plan:  Will give 2g Magnesium Sulfate IV x1  Will dose 2 tabs K Phos Neutral tabs q4 hours x 4   Will order electrolytes with am labs   Pharmacy to continue following and replace electrolytes as indicated.   Lu Duffel, PharmD, BCPS Clinical Pharmacist 06/12/2019 7:13 AM

## 2019-06-12 NOTE — TOC Transition Note (Signed)
Transition of Care [facility]) - CM/SW Discharge Note   Patient Details  Name: Spencer [name] MRN: [id number] Date of Birth: 1961-11-05  [facility] CM/SW Contact:  Shelbie Hutching, RN Phone Number: 06/12/2019, 8:29 AM   Clinical Narrative:    Patient will discharge home today.  Patient reports that he has been checking his blood sugars and taking his insulin like he is supposed to.  RNCM asked patient if he is interested in seeking treatment for substance abuse- pt shrugged shoulders and said "you can give me the information."  Resource booklet for [facility] given to patient with information on [facility], [facility] [employer], [place].  Patient reports that he has no other needs.    Final next level of care: Home/Self Care Barriers to Discharge: No Barriers Identified   Patient Goals and CMS Choice        Discharge Placement                       Discharge Plan and Services   Discharge Planning Services: CM Consult                                 Social Determinants of Health (SDOH) Interventions     Readmission Risk Interventions Readmission Risk Prevention Plan 02/19/2019 02/13/2019 02/10/2019  Transportation Screening Complete Complete Complete  PCP or Specialist Appt within 3-5 Days Complete - -  HRI or [place] Not Complete - -  HRI or Home Care Consult comments Patient does not meet homebound status - -  Palliative Care Screening Not Applicable - -  Medication Review (RN Care Manager) Complete Complete -  PCP or Specialist appointment within 3-5 days of discharge - (No Data) -  HRI or [place] - - Not Complete  SW Recovery Care/Counseling Consult - - Not Complete  SW Consult Not Complete Comments - - not indicated  Palliative Care Screening - - Not [place] - - Not Applicable  Some recent data might be hidden

## 2019-06-12 NOTE — Discharge Instructions (Signed)
Diabetes Mellitus and Exercise Exercising regularly is important for your overall health, especially when you have diabetes (diabetes mellitus). Exercising is not only about losing weight. It has many other health benefits, such as increasing muscle strength and bone density and reducing body fat and stress. This leads to improved fitness, flexibility, and endurance, all of which result in better overall health. Exercise has additional benefits for people with diabetes, including:  Reducing appetite.  Helping to lower and control blood glucose.  Lowering blood pressure.  Helping to control amounts of fatty substances (lipids) in the blood, such as cholesterol and triglycerides.  Helping the body to respond better to insulin (improving insulin sensitivity).  Reducing how much insulin the body needs.  Decreasing the risk for heart disease by: ? Lowering cholesterol and triglyceride levels. ? Increasing the levels of good cholesterol. ? Lowering blood glucose levels. What is my activity plan? Your health care provider or certified diabetes educator can help you make a plan for the type and frequency of exercise (activity plan) that works for you. Make sure that you:  Do at least 150 minutes of moderate-intensity or vigorous-intensity exercise each week. This could be brisk walking, biking, or water aerobics. ? Do stretching and strength exercises, such as yoga or weightlifting, at least 2 times a week. ? Spread out your activity over at least 3 days of the week.  Get some form of physical activity every day. ? Do not go more than 2 days in a row without some kind of physical activity. ? Avoid being inactive for more than 30 minutes at a time. Take frequent breaks to walk or stretch.  Choose a type of exercise or activity that you enjoy, and set realistic goals.  Start slowly, and gradually increase the intensity of your exercise over time. What do I need to know about managing my  diabetes?   Check your blood glucose before and after exercising. ? If your blood glucose is 240 mg/dL (13.3 mmol/L) or higher before you exercise, check your urine for ketones. If you have ketones in your urine, do not exercise until your blood glucose returns to normal. ? If your blood glucose is 100 mg/dL (5.6 mmol/L) or lower, eat a snack containing 15-20 grams of carbohydrate. Check your blood glucose 15 minutes after the snack to make sure that your level is above 100 mg/dL (5.6 mmol/L) before you start your exercise.  Know the symptoms of low blood glucose (hypoglycemia) and how to treat it. Your risk for hypoglycemia increases during and after exercise. Common symptoms of hypoglycemia can include: ? Hunger. ? Anxiety. ? Sweating and feeling clammy. ? Confusion. ? Dizziness or feeling light-headed. ? Increased heart rate or palpitations. ? Blurry vision. ? Tingling or numbness around the mouth, lips, or tongue. ? Tremors or shakes. ? Irritability.  Keep a rapid-acting carbohydrate snack available before, during, and after exercise to help prevent or treat hypoglycemia.  Avoid injecting insulin into areas of the body that are going to be exercised. For example, avoid injecting insulin into: ? The arms, when playing tennis. ? The legs, when jogging.  Keep records of your exercise habits. Doing this can help you and your health care provider adjust your diabetes management plan as needed. Write down: ? Food that you eat before and after you exercise. ? Blood glucose levels before and after you exercise. ? The type and amount of exercise you have done. ? When your insulin is expected to peak, if you use   insulin. Avoid exercising at times when your insulin is peaking.  When you start a new exercise or activity, work with your health care provider to make sure the activity is safe for you, and to adjust your insulin, medicines, or food intake as needed.  Drink plenty of water while  you exercise to prevent dehydration or heat stroke. Drink enough fluid to keep your urine clear or pale yellow. Summary  Exercising regularly is important for your overall health, especially when you have diabetes (diabetes mellitus).  Exercising has many health benefits, such as increasing muscle strength and bone density and reducing body fat and stress.  Your health care provider or certified diabetes educator can help you make a plan for the type and frequency of exercise (activity plan) that works for you.  When you start a new exercise or activity, work with your health care provider to make sure the activity is safe for you, and to adjust your insulin, medicines, or food intake as needed. This information is not intended to replace advice given to you by your health care provider. Make sure you discuss any questions you have with your health care provider. Document Released: 01/13/2004 Document Revised: 05/17/2017 Document Reviewed: 04/03/2016 Elsevier Patient Education  2020 Elsevier Inc.  

## 2019-06-13 NOTE — Discharge Summary (Signed)
[facility] [place] NAME: Spencer [name]    MR#:  [id number]  DATE OF BIRTH:  [birth year]  DATE OF ADMISSION:  06/09/2019   ADMITTING PHYSICIAN: Christel Mormon, MD  DATE OF DISCHARGE: 06/12/2019  5:02 PM  PRIMARY CARE PHYSICIAN: Jodi Marble, MD   ADMISSION DIAGNOSIS:  Hypothermia, initial encounter [T68.XXXA] Diabetic ketoacidosis with coma associated with type 1 diabetes mellitus ([place]) [E10.11] Sepsis, due to unspecified organism, unspecified whether acute organ dysfunction present ([place]) [A41.9] DISCHARGE DIAGNOSIS:  Active Problems:   Sepsis ([place])  SECONDARY DIAGNOSIS:   Past Medical History:  Diagnosis Date  . Brain aneurysm   . Broken bones    clavical, ankle, arms toes wriast   . Diabetes mellitus without complication ([place])   . Dysrhythmia   . GERD (gastroesophageal reflux disease)   . Hypertension   . Substance abuse ([place])    HOSPITAL COURSE:   1.Severe sepsis - Present on admission.  No obvious source of infection found we will stop antibiotics -Could be viral in nature  2. DKA - Resolved with treatment  3. Altered mental status -Likely secondary to DKA and/or sepsis - back to baseline   4.history of substance abuse with urine drug screen positive for cocaine metabolites -counseled  5.  Hypokalemia -Repleted  6.  Anemia: Likely of chronic disease  7.  Acute renal failure: prerenal, resolved with hydration DISCHARGE CONDITIONS:  stable CONSULTS OBTAINED:   DRUG ALLERGIES:  No Known Allergies DISCHARGE MEDICATIONS:   Allergies as of 06/12/2019   No Known Allergies     Medication List    STOP taking these medications   amLODipine 10 MG tablet Commonly known as: NORVASC   atorvastatin 10 MG tablet Commonly known as: LIPITOR   cyclobenzaprine 10 MG tablet Commonly known as: FLEXERIL   gabapentin 300 MG capsule Commonly known as: NEURONTIN   nicotine 14 mg/24hr patch Commonly  known as: NICODERM CQ - dosed in mg/24 hours     TAKE these medications   blood glucose meter kit and supplies Check blood sugar 4 times daily   fluconazole 200 MG tablet Commonly known as: DIFLUCAN Take 1 tablet (200 mg total) by mouth daily for 9 days.   folic acid 1 MG tablet Commonly known as: FOLVITE Take 1 tablet (1 mg total) by mouth daily.   insulin aspart 100 UNIT/ML injection Commonly known as: novoLOG Inject into the skin 3 (three) times daily before meals. Sliding scale   insulin glargine 100 UNIT/ML injection Commonly known as: LANTUS Inject 40 Units into the skin daily. What changed: Another medication with the same name was removed. Continue taking this medication, and follow the directions you see here.   Insulin Pen Needle 31G X 5 MM Misc Check blood sugars 4 times daily   pantoprazole 40 MG tablet Commonly known as: PROTONIX Take 1 tablet (40 mg total) by mouth daily.   thiamine 100 MG tablet Take 1 tablet (100 mg total) by mouth daily for 15 days.        DISCHARGE INSTRUCTIONS:   DIET:  Regular diet DISCHARGE CONDITION:  Stable ACTIVITY:  Activity as tolerated OXYGEN:  Home Oxygen: No.  Oxygen Delivery: room air DISCHARGE LOCATION:  home   If you experience worsening of your admission symptoms, develop shortness of breath, life threatening emergency, suicidal or homicidal thoughts you must seek medical attention immediately by calling 911 or calling your MD immediately  if symptoms less severe.  You Must read complete instructions/literature along with all the possible adverse reactions/side effects for all the Medicines you take and that have been prescribed to you. Take any new Medicines after you have completely understood and accpet all the possible adverse reactions/side effects.   Please note  You were cared for by a hospitalist during your hospital stay. If you have any questions about your discharge medications or the care you  received while you were in the hospital after you are discharged, you can call the unit and asked to speak with the hospitalist on call if the hospitalist that took care of you is not available. Once you are discharged, your primary care physician will handle any further medical issues. Please note that NO REFILLS for any discharge medications will be authorized once you are discharged, as it is imperative that you return to your primary care physician (or establish a relationship with a primary care physician if you do not have one) for your aftercare needs so that they can reassess your need for medications and monitor your lab values.    On the day of Discharge:  VITAL SIGNS:  Blood pressure 131/83, pulse 64, temperature 97.8 F (36.6 C), temperature source Oral, resp. rate 16, height '5\' 6"'$  (1.676 m), weight 66.6 kg, SpO2 100 %. PHYSICAL EXAMINATION:  GENERAL:  58 y.o.-year-old patient lying in the bed with no acute distress.  EYES: Pupils equal, round, reactive to light and accommodation. No scleral icterus. Extraocular muscles intact.  HEENT: Head atraumatic, normocephalic. Oropharynx and nasopharynx clear.  NECK:  Supple, no jugular venous distention. No thyroid enlargement, no tenderness.  LUNGS: Normal breath sounds bilaterally, no wheezing, rales,rhonchi or crepitation. No use of accessory muscles of respiration.  CARDIOVASCULAR: S1, S2 normal. No murmurs, rubs, or gallops.  ABDOMEN: Soft, non-tender, non-distended. Bowel sounds present. No organomegaly or mass.  EXTREMITIES: No pedal edema, cyanosis, or clubbing.  NEUROLOGIC: Cranial nerves II through XII are intact. Muscle strength 5/5 in all extremities. Sensation intact. Gait not checked.  PSYCHIATRIC: The patient is alert and oriented x 3.  SKIN: No obvious rash, lesion, or ulcer.  DATA REVIEW:   CBC Recent Labs  Lab 06/12/19 0452  WBC 6.2  HGB 7.8*  HCT 23.0*  PLT 284    Chemistries  Recent Labs  Lab 06/09/19 1604   06/12/19 0452  NA 132*   < > 142  K 6.0*   < > 3.3*  CL 94*   < > 109  CO2 <7*   < > 26  GLUCOSE 1,228*   < > 201*  BUN 50*   < > 22*  CREATININE 3.82*   < > 1.12  CALCIUM 8.1*   < > 7.7*  MG  --    < > 1.7  AST 17  --   --   ALT 14  --   --   ALKPHOS 96  --   --   BILITOT 1.5*  --   --    < > = values in this interval not displayed.     Follow-up Information    Jodi Marble, MD. Go on 06/18/2019.   Specialty: Internal Medicine Why: @ 11:30am Contact information: [place] [address] [phone]           Management plans discussed with the patient, family and they are in agreement.  CODE STATUS: Prior   TOTAL TIME TAKING CARE OF THIS PATIENT: 45 minutes.    Max Sane M.D on  06/13/2019 at 6:12 PM  Between 7am to 6pm - Pager - [phone]  After 6pm go to [email or web address] - Proofreader  [facility]  Office  [phone]  CC: Primary care physician; Jodi Marble, MD   Note: This dictation was prepared with Dragon dictation along with smaller phrase technology. Any transcriptional errors that result from this process are unintentional.

## 2019-06-14 LAB — CULTURE, BLOOD (ROUTINE X 2)
Culture: NO GROWTH
Culture: NO GROWTH
Special Requests: ADEQUATE
Special Requests: ADEQUATE
Special Requests: ADEQUATE

## 2019-06-19 ENCOUNTER — Other Ambulatory Visit: Payer: Self-pay | Admitting: Internal Medicine

## 2019-07-19 ENCOUNTER — Other Ambulatory Visit: Payer: Self-pay

## 2019-07-19 ENCOUNTER — Emergency Department
Admission: EM | Admit: 2019-07-19 | Discharge: 2019-07-20 | Disposition: A | Discharge status: Home / Self Care | Payer: Medicaid Other | Attending: Emergency Medicine | Admitting: Emergency Medicine

## 2019-07-19 DIAGNOSIS — E11649 Type 2 diabetes mellitus with hypoglycemia without coma: Secondary | ICD-10-CM | POA: Insufficient documentation

## 2019-07-19 DIAGNOSIS — I10 Essential (primary) hypertension: Secondary | ICD-10-CM | POA: Diagnosis not present

## 2019-07-19 DIAGNOSIS — Z79899 Other long term (current) drug therapy: Secondary | ICD-10-CM | POA: Diagnosis not present

## 2019-07-19 DIAGNOSIS — F17200 Nicotine dependence, unspecified, uncomplicated: Secondary | ICD-10-CM | POA: Insufficient documentation

## 2019-07-19 DIAGNOSIS — Z794 Long term (current) use of insulin: Secondary | ICD-10-CM | POA: Insufficient documentation

## 2019-07-19 DIAGNOSIS — E162 Hypoglycemia, unspecified: Secondary | ICD-10-CM

## 2019-07-19 LAB — COMPREHENSIVE METABOLIC PANEL
ALT: 19 U/L (ref 0–44)
AST: 24 U/L (ref 15–41)
Albumin: 4 g/dL (ref 3.5–5.0)
Alkaline Phosphatase: 63 U/L (ref 38–126)
Anion gap: 10 (ref 5–15)
BUN: 26 mg/dL — ABNORMAL HIGH (ref 6–20)
CO2: 25 mmol/L (ref 22–32)
Calcium: 9.6 mg/dL (ref 8.9–10.3)
Chloride: 107 mmol/L (ref 98–111)
Creatinine, Ser: 0.97 mg/dL (ref 0.61–1.24)
GFR calc Af Amer: >60 mL/min (ref >60)
GFR calc non Af Amer: >60 mL/min (ref >60)
Glucose, Bld: 145 mg/dL — ABNORMAL HIGH (ref 70–99)
Potassium: 3.5 mmol/L (ref 3.5–5.1)
Sodium: 142 mmol/L (ref 135–145)
Total Bilirubin: 0.6 mg/dL (ref 0.3–1.2)
Total Protein: 7 g/dL (ref 6.5–8.1)

## 2019-07-19 LAB — CBC WITH DIFFERENTIAL/PLATELET
Abs Immature Granulocytes: 0.02 10*3/uL (ref 0.00–0.07)
Basophils Absolute: 0 10*3/uL (ref 0.0–0.1)
Basophils Relative: 0 %
Eosinophils Absolute: 0 10*3/uL (ref 0.0–0.5)
Eosinophils Relative: 1 %
HCT: 37 % — ABNORMAL LOW (ref 39.0–52.0)
Hemoglobin: 12.4 g/dL — ABNORMAL LOW (ref 13.0–17.0)
Immature Granulocytes: 0 %
Lymphocytes Relative: 18 %
Lymphs Abs: 1.3 10*3/uL (ref 0.7–4.0)
MCH: 32.3 pg (ref 26.0–34.0)
MCHC: 33.5 g/dL (ref 30.0–36.0)
MCV: 96.4 fL (ref 80.0–100.0)
Monocytes Absolute: 0.5 10*3/uL (ref 0.1–1.0)
Monocytes Relative: 6 %
Neutro Abs: 5.4 10*3/uL (ref 1.7–7.7)
Neutrophils Relative %: 75 %
Platelets: 410 10*3/uL — ABNORMAL HIGH (ref 150–400)
RBC: 3.84 MIL/uL — ABNORMAL LOW (ref 4.22–5.81)
RDW: 13.5 % (ref 11.5–15.5)
WBC: 7.3 10*3/uL (ref 4.0–10.5)
nRBC: 0 % (ref 0.0–0.2)

## 2019-07-19 LAB — GLUCOSE, CAPILLARY: Glucose-Capillary: 163 mg/dL — ABNORMAL HIGH (ref 70–99)

## 2019-07-19 NOTE — ED Notes (Signed)
Pt given sandwich tray and beverage.  

## 2019-07-19 NOTE — ED Triage Notes (Signed)
Pt comes EMS for hypoglycemia. Third party called saying pt was confused on the phone and thought his sugar was low. Pt was initially unresponsive then began thrashing with EMS. CBG was 60. Pt had 254ml of D10 IV and CBG was then 136. Pt ambulatory to bed. Pt AOx4 at this time. Denies taking insulin purposefully with intent to OD.

## 2019-07-19 NOTE — ED Provider Notes (Signed)
[facility] Emergency Department Provider Note  ____________________________________________   First MD Initiated Contact with Patient 07/19/19 2300     (approximate)  I have reviewed the triage vital signs and the nursing notes.   HISTORY  Chief Complaint Hypoglycemia    HPI Spencer [name] is a 58 y.o. male with medical history as listed below which includes a number of chronic medical, past traumatic/surgical, and substance abuse issues.  He presents by EMS for hypoglycemia.  Apparently his significant other called because he was confused at home and they were concerned his blood sugar was low.  Initially he was unresponsive for EMS and had a fingerstick blood sugar of 60.  After receiving D10 250 mL by IV by EMS prior to arrival and having a blood sugar measured at 136, he was alert, oriented, and asymptomatic.  The patient says he is not sure what happened.  He claims that he ate dinner and take his usual insulin at bedtime, which consists of Lantus 40 units and NovoLog 20 units.  He denies any intention to hurt himself by taking extra insulin and says that he would not have messed up the doses because he has been doing it for so long.  He denies alcohol and drug use.  He denies any illness earlier in the day.  He denies fever/chills, sore throat, chest pain, shortness of breath, cough, nausea, vomiting, and abdominal pain.  The onset was acute and the symptoms were severe and nothing in particular makes them better or worse.         Past Medical History:  Diagnosis Date  . Brain aneurysm   . Broken bones    clavical, ankle, arms toes wriast   . Diabetes mellitus without complication ([place])   . Dysrhythmia   . GERD (gastroesophageal reflux disease)   . Hypertension   . Substance abuse [facility])     Patient Active Problem List   Diagnosis Date Noted  . Sepsis ([place]) 06/09/2019  . DKA (diabetic ketoacidoses) ([place]) 05/28/2019  . AKI (acute kidney injury)  ([place]) 02/19/2019  . Hypoglycemia 10/23/2018  . Chronic low back pain 01/16/2017  . Chronic neck pain 01/16/2017  . Diabetic neuropathy ([place]) 01/16/2017  . DM2 (diabetes mellitus, type 2) ([place]) 01/16/2017  . History of cocaine abuse ([place]) 01/16/2017  . Personal history of subdural hematoma 01/16/2017  . Closed displaced fracture of body of left calcaneus with delayed healing 12/13/2016  . Protein-calorie malnutrition, severe 11/08/2016  . DKA, type 2 ([place]) 11/06/2016  . Diabetic ketoacidosis ([place]) 04/03/2015  . DKA (diabetic ketoacidoses) ([place]) 04/03/2015  . Hypertension 04/03/2015  . Depression 04/03/2015  . Opiate dependence ([place]) 04/03/2015  . Tobacco abuse 04/03/2015  . Lumbosacral neuritis 07/19/2014  . Pain of finger of right hand 07/19/2014  . Type II or unspecified type diabetes mellitus without mention of complication, not stated as uncontrolled 07/19/2014  . Knee pain 09/12/2013  . Hernia of flank 09/20/2012  . Epidermoid cyst of skin 09/20/2012  . Chronic pain of both shoulders 03/27/2012    Past Surgical History:  Procedure Laterality Date  . ESOPHAGOGASTRODUODENOSCOPY (EGD) WITH PROPOFOL N/A 06/04/2019   Procedure: ESOPHAGOGASTRODUODENOSCOPY (EGD) WITH PROPOFOL;  Surgeon: Toledo, Benay Pike, MD;  Location: [facility] ENDOSCOPY;  Service: Gastroenterology;  Laterality: N/A;  . HERNIA REPAIR    . IMPLANTATION / PLACEMENT OF STRIP ELECTRODES VIA BURR HOLES SUBDURAL     anyrusum  . NECK SURGERY    . TEE WITHOUT CARDIOVERSION N/A 06/03/2019  Procedure: TRANSESOPHAGEAL ECHOCARDIOGRAM (TEE);  Surgeon: Teodoro Spray, MD;  Location: [facility] ORS;  Service: Cardiovascular;  Laterality: N/A;    Prior to Admission medications   Medication Sig Start Date End Date Taking? Authorizing Provider  blood glucose meter kit and supplies Check blood sugar 4 times daily 01/08/19   Mayo, Pete Pelt, MD  insulin aspart (NOVOLOG) 100 UNIT/ML injection Inject into the skin 3 (three) times daily before  meals. Sliding scale     [provider]  insulin glargine (LANTUS) 100 UNIT/ML injection Inject 40 Units into the skin daily.    [provider]  Insulin Pen Needle 31G X 5 MM MISC Check blood sugars 4 times daily 01/08/19   Mayo, Pete Pelt, MD  pantoprazole (PROTONIX) 40 MG tablet Take 1 tablet (40 mg total) by mouth daily. 06/05/19 07/05/19  Saundra Shelling, MD    Allergies Patient has no known allergies.  Family History  Problem Relation Age of Onset  . CAD Sister   . Hypertension Sister   . Healthy Mother   . Healthy Father     Social History Social History   Tobacco Use  . Smoking status: Current Some Day Smoker  . Smokeless tobacco: Never Used  Substance Use Topics  . Alcohol use: Yes    Alcohol/week: 1.0 standard drinks    Types: 1 Cans of beer per week  . Drug use: Not Currently    Types: Cocaine    Review of Systems Constitutional: No fever/chills Eyes: No visual changes. ENT: No sore throat. Cardiovascular: Denies chest pain. Respiratory: Denies shortness of breath. Gastrointestinal: No abdominal pain.  No nausea, no vomiting.  No diarrhea.  No constipation. Genitourinary: Negative for dysuria. Musculoskeletal: Negative for neck pain.  Negative for back pain. Integumentary: Negative for rash. Neurological: Negative for headaches, focal weakness or numbness.   ____________________________________________   PHYSICAL EXAM:  VITAL SIGNS: ED Triage Vitals [07/19/19 2205]  Enc Vitals Group     BP (!) 146/98     Pulse Rate 77     Resp 14     Temp 97.9 F (36.6 C)     Temp Source Oral     SpO2 100 %     Weight 70.3 kg (155 lb)     Height 1.829 m (6')     Head Circumference      Peak Flow      Pain Score 0     Pain Loc      Pain Edu?      Excl. in [place]?     Constitutional: Alert and oriented.  No acute distress, asymptomatic. Eyes: Conjunctivae are normal.  Head: Atraumatic. Nose: No congestion/rhinnorhea. Mouth/Throat: Mucous  membranes are moist. Neck: No stridor.  No meningeal signs.   Cardiovascular: Normal rate, regular rhythm. Good peripheral circulation. Grossly normal heart sounds. Respiratory: Normal respiratory effort.  No retractions. Gastrointestinal: Soft and nontender. No distention.  Musculoskeletal: No lower extremity tenderness nor edema. No gross deformities of extremities. Neurologic:  Normal speech and language. No gross focal neurologic deficits are appreciated.  Skin:  Skin is warm, dry and intact. Psychiatric: Mood and affect are generally normal although the patient is irritable, but he does not show any signs of being altered and has no emergent warning signs of psychiatric illness nor of acute intoxication.  ____________________________________________   LABS (all labs ordered are listed, but only abnormal results are displayed)  Labs Reviewed  GLUCOSE, CAPILLARY - Abnormal; Notable for the following components:  Result Value   Glucose-Capillary 163 (*)    All other components within normal limits  CBC WITH DIFFERENTIAL/PLATELET - Abnormal; Notable for the following components:   RBC 3.84 (*)    Hemoglobin 12.4 (*)    HCT 37.0 (*)    Platelets 410 (*)    All other components within normal limits  COMPREHENSIVE METABOLIC PANEL - Abnormal; Notable for the following components:   Glucose, Bld 145 (*)    BUN 26 (*)    All other components within normal limits  GLUCOSE, CAPILLARY - Abnormal; Notable for the following components:   Glucose-Capillary 412 (*)    All other components within normal limits  CBG MONITORING, ED   ____________________________________________  EKG  No indication for EKG ____________________________________________  RADIOLOGY I, Hinda Kehr, personally viewed and evaluated these images (plain radiographs) as part of my medical decision making, as well as reviewing the written report by the radiologist.  ED MD interpretation: No indication for  imaging  Official radiology report(s): No results found.  ____________________________________________   PROCEDURES   Procedure(s) performed (including Critical Care):  Procedures   ____________________________________________   INITIAL IMPRESSION / MDM / [place] / ED COURSE  As part of my medical decision making, I reviewed the following data within the [place] notes reviewed and incorporated, Labs reviewed , Old chart reviewed, Notes from prior ED visits and [place] Controlled Substance Database   Differential diagnosis includes, but is not limited to, accidental insulin overdose or poor p.o. intake in the setting of using insulin, intentional insulin overdose, metabolic or electrolyte abnormality, acute infection.  The patient's vital signs are stable and he is afebrile, normotensive, not tachycardic.  He is awake, alert, oriented, and does not seem to want to be here based on his surly and irritable manner.  I do not see any signs or symptoms of a medical emergency at this time and if he decides he wants to leave that is certainly within his right as he has a capacity to make that decision.  However I recommended he stay to eat a full sandwich tray while we check basic labs and recheck his blood sugar to make sure it is not dropping precipitously once again.  He says that he understands.  In the meantime his lab results of come back and his comprehensive metabolic panel is within normal limits including a glucose of 145 and his CBC is within normal limits.  Urinalysis is pending but is not critical to the medical work-up.      Clinical Course as of Jul 19 38  Sun Jul 20, 2019  0037 Repeat blood sugar is 412.  Although this is less than ideal, it is also reassuring in the sense that I am not concerned he is continuing to drop or that he is going to become hypoglycemic overnight.  The patient is asymptomatic and says he is ready to go home.  I am  giving my usual and customary return precautions.   [CF]    Clinical Course User Index [CF] Hinda Kehr, MD     ____________________________________________  FINAL CLINICAL IMPRESSION(S) / ED DIAGNOSES  Final diagnoses:  Hypoglycemia     MEDICATIONS GIVEN DURING THIS VISIT:  Medications - No data to display   ED Discharge Orders    None      *Please note:  Spencer [name] was evaluated in Emergency Department on 07/20/2019 for the symptoms described in the history of present illness. He  was evaluated in the context of the global COVID-19 pandemic, which necessitated consideration that the patient might be at risk for infection with the SARS-CoV-2 virus that causes COVID-19. Institutional protocols and algorithms that pertain to the evaluation of patients at risk for COVID-19 are in a state of rapid change based on information released by regulatory bodies including the CDC and federal and state organizations. These policies and algorithms were followed during the patient's care in the ED.  Some ED evaluations and interventions may be delayed as a result of limited staffing during the pandemic.*  Note:  This document was prepared using Dragon voice recognition software and may include unintentional dictation errors.   Hinda Kehr, MD 07/20/19 [phone]

## 2019-07-19 NOTE — ED Notes (Signed)
Pt given crackers and diet coke. Pt ambulatory to restroom. Pt denies any further needs at this time.

## 2019-07-19 NOTE — ED Notes (Signed)
Answered pt's call bell. Pt states he forgot why he rang it "it wasn't important".

## 2019-07-20 ENCOUNTER — Emergency Department
Admission: EM | Admit: 2019-07-20 | Discharge: 2019-07-20 | Disposition: A | Discharge status: Home / Self Care | Payer: Medicaid Other | Source: Home / Self Care | Attending: Emergency Medicine | Admitting: Emergency Medicine

## 2019-07-20 ENCOUNTER — Other Ambulatory Visit: Payer: Self-pay

## 2019-07-20 ENCOUNTER — Emergency Department: Payer: Medicaid Other

## 2019-07-20 DIAGNOSIS — F10920 Alcohol use, unspecified with intoxication, uncomplicated: Secondary | ICD-10-CM | POA: Insufficient documentation

## 2019-07-20 DIAGNOSIS — I10 Essential (primary) hypertension: Secondary | ICD-10-CM | POA: Insufficient documentation

## 2019-07-20 DIAGNOSIS — Z79899 Other long term (current) drug therapy: Secondary | ICD-10-CM | POA: Insufficient documentation

## 2019-07-20 DIAGNOSIS — R4182 Altered mental status, unspecified: Secondary | ICD-10-CM | POA: Insufficient documentation

## 2019-07-20 DIAGNOSIS — R739 Hyperglycemia, unspecified: Secondary | ICD-10-CM

## 2019-07-20 DIAGNOSIS — E1165 Type 2 diabetes mellitus with hyperglycemia: Secondary | ICD-10-CM | POA: Insufficient documentation

## 2019-07-20 DIAGNOSIS — F1010 Alcohol abuse, uncomplicated: Secondary | ICD-10-CM

## 2019-07-20 DIAGNOSIS — F17200 Nicotine dependence, unspecified, uncomplicated: Secondary | ICD-10-CM | POA: Insufficient documentation

## 2019-07-20 DIAGNOSIS — E1140 Type 2 diabetes mellitus with diabetic neuropathy, unspecified: Secondary | ICD-10-CM | POA: Insufficient documentation

## 2019-07-20 DIAGNOSIS — Z794 Long term (current) use of insulin: Secondary | ICD-10-CM | POA: Insufficient documentation

## 2019-07-20 LAB — COMPREHENSIVE METABOLIC PANEL
ALT: 19 U/L (ref 0–44)
AST: 27 U/L (ref 15–41)
Albumin: 4 g/dL (ref 3.5–5.0)
Alkaline Phosphatase: 79 U/L (ref 38–126)
Anion gap: 16 — ABNORMAL HIGH (ref 5–15)
BUN: 16 mg/dL (ref 6–20)
CO2: 20 mmol/L — ABNORMAL LOW (ref 22–32)
Calcium: 9.1 mg/dL (ref 8.9–10.3)
Chloride: 102 mmol/L (ref 98–111)
Creatinine, Ser: 0.88 mg/dL (ref 0.61–1.24)
GFR calc Af Amer: >60 mL/min (ref >60)
GFR calc non Af Amer: >60 mL/min (ref >60)
Glucose, Bld: 505 mg/dL (ref 70–99)
Potassium: 4.4 mmol/L (ref 3.5–5.1)
Sodium: 138 mmol/L (ref 135–145)
Total Bilirubin: 0.7 mg/dL (ref 0.3–1.2)
Total Protein: 7 g/dL (ref 6.5–8.1)

## 2019-07-20 LAB — URINALYSIS, COMPLETE (UACMP) WITH MICROSCOPIC
Bacteria, UA: NONE SEEN
Bilirubin Urine: NEGATIVE
Glucose, UA: >=500 mg/dL — AB
Hgb urine dipstick: NEGATIVE
Ketones, ur: NEGATIVE mg/dL
Leukocytes,Ua: NEGATIVE
Nitrite: NEGATIVE
Protein, ur: NEGATIVE mg/dL
Specific Gravity, Urine: 1.015 (ref 1.005–1.030)
Squamous Epithelial / HPF: NONE SEEN (ref 0–5)
pH: 6 (ref 5.0–8.0)

## 2019-07-20 LAB — CBC
HCT: 42.1 % (ref 39.0–52.0)
Hemoglobin: 14.1 g/dL (ref 13.0–17.0)
MCH: 32 pg (ref 26.0–34.0)
MCHC: 33.5 g/dL (ref 30.0–36.0)
MCV: 95.5 fL (ref 80.0–100.0)
Platelets: 408 10*3/uL — ABNORMAL HIGH (ref 150–400)
RBC: 4.41 MIL/uL (ref 4.22–5.81)
RDW: 13.2 % (ref 11.5–15.5)
WBC: 6.1 10*3/uL (ref 4.0–10.5)
nRBC: 0 % (ref 0.0–0.2)

## 2019-07-20 LAB — BLOOD GAS, VENOUS
Acid-base deficit: 5.2 mmol/L — ABNORMAL HIGH (ref 0.0–2.0)
Bicarbonate: 20.6 mmol/L (ref 20.0–28.0)
O2 Saturation: 80.6 %
Patient temperature: 37
pCO2, Ven: 40 mmHg — ABNORMAL LOW (ref 44.0–60.0)
pH, Ven: 7.32 (ref 7.250–7.430)
pO2, Ven: 49 mmHg — ABNORMAL HIGH (ref 32.0–45.0)

## 2019-07-20 LAB — URINE DRUG SCREEN, QUALITATIVE (ARMC ONLY)
Amphetamines, Ur Screen: NOT DETECTED
Barbiturates, Ur Screen: NOT DETECTED
Benzodiazepine, Ur Scrn: NOT DETECTED
Cannabinoid 50 Ng, Ur ~~LOC~~: NOT DETECTED
Cocaine Metabolite,Ur ~~LOC~~: NOT DETECTED
MDMA (Ecstasy)Ur Screen: NOT DETECTED
Methadone Scn, Ur: NOT DETECTED
Opiate, Ur Screen: NOT DETECTED
Phencyclidine (PCP) Ur S: NOT DETECTED
Tricyclic, Ur Screen: NOT DETECTED

## 2019-07-20 LAB — GLUCOSE, CAPILLARY
Glucose-Capillary: 325 mg/dL — ABNORMAL HIGH (ref 70–99)
Glucose-Capillary: 364 mg/dL — ABNORMAL HIGH (ref 70–99)
Glucose-Capillary: 412 mg/dL — ABNORMAL HIGH (ref 70–99)

## 2019-07-20 LAB — ETHANOL: Alcohol, Ethyl (B): 263 mg/dL — ABNORMAL HIGH (ref <10)

## 2019-07-20 MED ORDER — SODIUM CHLORIDE 0.9 % IV BOLUS
1000.0000 mL | Freq: Once | INTRAVENOUS | Status: AC
  Administered 2019-07-20: 12:00:00 1000 mL via INTRAVENOUS

## 2019-07-20 MED ORDER — INSULIN ASPART 100 UNIT/ML ~~LOC~~ SOLN
10.0000 [IU] | Freq: Once | SUBCUTANEOUS | Status: AC
  Administered 2019-07-20: 13:00:00 10 [IU] via INTRAVENOUS
  Filled 2019-07-20: qty 1

## 2019-07-20 NOTE — Discharge Instructions (Signed)
As we discussed, your blood sugar dropped too low today.  We recommend that you eat small snacks and keep a very careful watch on your blood sugar.  Do not use any more insulin tonight and be sure to check your blood glucose in the morning before taking your morning insulin.  Follow-up with your regular doctor at the next available opportunity.  Return to the emergency department with new or worsening symptoms that concern you.

## 2019-07-20 NOTE — ED Notes (Signed)
Iv's taken out. Pt calling for ride.

## 2019-07-20 NOTE — ED Notes (Signed)
Pt signed physical discharge form. 

## 2019-07-20 NOTE — ED Notes (Signed)
Pt is discharged but still very intoxicated. Dr is aware. Pt is sleeping currently

## 2019-07-20 NOTE — ED Provider Notes (Signed)
[facility] Emergency Department Provider Note  Time seen: 11:45 AM  I have reviewed the triage vital signs and the nursing notes.   HISTORY  Chief Complaint Alcohol Problem and Hyperglycemia    HPI Spencer [name] is a 58 y.o. male with a past medical history of diabetes, gastric reflux, hypertension, substance abuse, presents to the emergency department being found on the ground.  Per EMS report there was report of alcohol being involved, patient denies drinking alcohol today but states he did drink last night.  Patient was seen in the emergency department yesterday for hypoglycemia, was discharged after blood glucose correction.  EMS notes today the patient's blood glucose is reading greater than 500.  Currently patient is awake, he is somewhat somnolent but awakens easily to voice will answer questions with questionable accuracy however.  Denies any alcohol use today.  Denies any drug use.  Denies any fever cough congestion or shortness of breath.  Patient was found on the ground but denies falling.   Past Medical History:  Diagnosis Date  . Brain aneurysm   . Broken bones    clavical, ankle, arms toes wriast   . Diabetes mellitus without complication ([place])   . Dysrhythmia   . GERD (gastroesophageal reflux disease)   . Hypertension   . Substance abuse [facility])     Patient Active Problem List   Diagnosis Date Noted  . Sepsis ([place]) 06/09/2019  . DKA (diabetic ketoacidoses) ([place]) 05/28/2019  . AKI (acute kidney injury) ([place]) 02/19/2019  . Hypoglycemia 10/23/2018  . Chronic low back pain 01/16/2017  . Chronic neck pain 01/16/2017  . Diabetic neuropathy ([place]) 01/16/2017  . DM2 (diabetes mellitus, type 2) ([place]) 01/16/2017  . History of cocaine abuse ([place]) 01/16/2017  . Personal history of subdural hematoma 01/16/2017  . Closed displaced fracture of body of left calcaneus with delayed healing 12/13/2016  . Protein-calorie malnutrition, severe 11/08/2016  . DKA,  type 2 ([place]) 11/06/2016  . Diabetic ketoacidosis ([place]) 04/03/2015  . DKA (diabetic ketoacidoses) ([place]) 04/03/2015  . Hypertension 04/03/2015  . Depression 04/03/2015  . Opiate dependence ([place]) 04/03/2015  . Tobacco abuse 04/03/2015  . Lumbosacral neuritis 07/19/2014  . Pain of finger of right hand 07/19/2014  . Type II or unspecified type diabetes mellitus without mention of complication, not stated as uncontrolled 07/19/2014  . Knee pain 09/12/2013  . Hernia of flank 09/20/2012  . Epidermoid cyst of skin 09/20/2012  . Chronic pain of both shoulders 03/27/2012    Past Surgical History:  Procedure Laterality Date  . ESOPHAGOGASTRODUODENOSCOPY (EGD) WITH PROPOFOL N/A 06/04/2019   Procedure: ESOPHAGOGASTRODUODENOSCOPY (EGD) WITH PROPOFOL;  Surgeon: Toledo, Benay Pike, MD;  Location: [facility] ENDOSCOPY;  Service: Gastroenterology;  Laterality: N/A;  . HERNIA REPAIR    . IMPLANTATION / PLACEMENT OF STRIP ELECTRODES VIA BURR HOLES SUBDURAL     anyrusum  . NECK SURGERY    . TEE WITHOUT CARDIOVERSION N/A 06/03/2019   Procedure: TRANSESOPHAGEAL ECHOCARDIOGRAM (TEE);  Surgeon: Teodoro Spray, MD;  Location: [facility] ORS;  Service: Cardiovascular;  Laterality: N/A;    Prior to Admission medications   Medication Sig Start Date End Date Taking? Authorizing Provider  blood glucose meter kit and supplies Check blood sugar 4 times daily 01/08/19   Mayo, Pete Pelt, MD  insulin aspart (NOVOLOG) 100 UNIT/ML injection Inject into the skin 3 (three) times daily before meals. Sliding scale     [provider]  insulin glargine (LANTUS) 100 UNIT/ML injection Inject 40 Units  into the skin daily.    [provider]  Insulin Pen Needle 31G X 5 MM MISC Check blood sugars 4 times daily 01/08/19   Mayo, Pete Pelt, MD  pantoprazole (PROTONIX) 40 MG tablet Take 1 tablet (40 mg total) by mouth daily. 06/05/19 07/05/19  Saundra Shelling, MD    No Known Allergies  Family History  Problem Relation Age of Onset   . CAD Sister   . Hypertension Sister   . Healthy Mother   . Healthy Father     Social History Social History   Tobacco Use  . Smoking status: Current Some Day Smoker  . Smokeless tobacco: Never Used  Substance Use Topics  . Alcohol use: Yes    Alcohol/week: 1.0 standard drinks    Types: 1 Cans of beer per week  . Drug use: Not Currently    Types: Cocaine    Review of Systems Constitutional: Negative for fever Cardiovascular: Negative for chest pain. Respiratory: Negative for shortness of breath.  Negative for cough. Gastrointestinal: Negative for abdominal pain Musculoskeletal: Negative for musculoskeletal complaints Neurological: Negative for headache All other ROS negative  ____________________________________________   PHYSICAL EXAM:  VITAL SIGNS: ED Triage Vitals [07/20/19 1131]  Enc Vitals Group     BP      Pulse      Resp      Temp      Temp src      SpO2      Weight 160 lb (72.6 kg)     Height      Head Circumference      Peak Flow      Pain Score 0     Pain Loc      Pain Edu?      Excl. in [place]?    Constitutional: Somnolent, but does awake to voice answers questions, questionable accuracy. Eyes: Normal, to 3 mm reactive. ENT      Head: Normocephalic and atraumatic.      Mouth/Throat: Somewhat dry appearing mucous membranes. Cardiovascular: Normal rate, regular rhythm.  Respiratory: Normal respiratory effort without tachypnea nor retractions. Breath sounds are clear  Gastrointestinal: Soft and nontender. No distention.   Musculoskeletal: Nontender with normal range of motion in all extremities Neurologic:  Normal speech and language. No gross focal neurologic deficits  Skin:  Skin is warm, dry and intact.  Psychiatric: Mood and affect are normal.  ____________________________________________    RADIOLOGY  CT head negative.  ____________________________________________   INITIAL IMPRESSION / ASSESSMENT AND PLAN / ED COURSE  Pertinent  labs & imaging results that were available during my care of the patient were reviewed by me and considered in my medical decision making (see chart for details).   Patient presents to the emergency department being found on the ground by a bystander who called EMS.  Patient has a history of substance abuse including alcohol abuse.  Admits to drinking last night but denies drinking today.  Patient also found to have a blood glucose greater than 500 per EMS.  We will check labs, IV hydrate, obtain a CT scan of the head as a precaution as the patient was found on the ground and denies falling and does not know how he got onto the ground.  Patient's alcohol level is 263.  Patient's blood glucose is decreasing, currently 364 on last check.  Patient's VBG is reassuring no signs of DKA.  Patient is much more awake and alert.  Attempting to find a ride home.  Overall  the patient appears well and I suspect alcohol intoxication to be the cause of the patient symptoms and possible cause of his hyperglycemia.  Spencer [name] was evaluated in Emergency Department on 07/20/2019 for the symptoms described in the history of present illness. He was evaluated in the context of the global COVID-19 pandemic, which necessitated consideration that the patient might be at risk for infection with the SARS-CoV-2 virus that causes COVID-19. Institutional protocols and algorithms that pertain to the evaluation of patients at risk for COVID-19 are in a state of rapid change based on information released by regulatory bodies including the CDC and federal and state organizations. These policies and algorithms were followed during the patient's care in the ED.  ____________________________________________   FINAL CLINICAL IMPRESSION(S) / ED DIAGNOSES  Hyperglycemia Alcohol intoxication    Harvest Dark, MD 07/20/19 1416

## 2019-07-20 NOTE — ED Notes (Signed)
Pt ate is requested is bed be laid down to nap. Explained he will need to stay until more sober then will be discharged.

## 2019-07-20 NOTE — ED Triage Notes (Signed)
Pt was found on the ground by bystanders etoh, bs 534. Urinated on himself. Was seen yesterday for hypoglycemia

## 2019-07-27 ENCOUNTER — Inpatient Hospital Stay: Payer: Medicaid Other

## 2019-07-27 ENCOUNTER — Other Ambulatory Visit: Payer: Self-pay

## 2019-07-27 ENCOUNTER — Inpatient Hospital Stay
Admission: EM | Admit: 2019-07-27 | Discharge: 2019-07-29 | DRG: 637 | Disposition: A | Discharge status: Home / Self Care | Payer: Medicaid Other | Attending: Internal Medicine | Admitting: Internal Medicine

## 2019-07-27 ENCOUNTER — Encounter: Payer: Self-pay | Admitting: Emergency Medicine

## 2019-07-27 ENCOUNTER — Emergency Department: Payer: Medicaid Other

## 2019-07-27 DIAGNOSIS — N179 Acute kidney failure, unspecified: Secondary | ICD-10-CM | POA: Diagnosis present

## 2019-07-27 DIAGNOSIS — G9341 Metabolic encephalopathy: Secondary | ICD-10-CM | POA: Diagnosis present

## 2019-07-27 DIAGNOSIS — E875 Hyperkalemia: Secondary | ICD-10-CM | POA: Diagnosis present

## 2019-07-27 DIAGNOSIS — Z8679 Personal history of other diseases of the circulatory system: Secondary | ICD-10-CM

## 2019-07-27 DIAGNOSIS — R109 Unspecified abdominal pain: Secondary | ICD-10-CM

## 2019-07-27 DIAGNOSIS — K2900 Acute gastritis without bleeding: Secondary | ICD-10-CM | POA: Diagnosis present

## 2019-07-27 DIAGNOSIS — I129 Hypertensive chronic kidney disease with stage 1 through stage 4 chronic kidney disease, or unspecified chronic kidney disease: Secondary | ICD-10-CM | POA: Diagnosis present

## 2019-07-27 DIAGNOSIS — D649 Anemia, unspecified: Secondary | ICD-10-CM | POA: Diagnosis present

## 2019-07-27 DIAGNOSIS — D72829 Elevated white blood cell count, unspecified: Secondary | ICD-10-CM | POA: Diagnosis present

## 2019-07-27 DIAGNOSIS — E11649 Type 2 diabetes mellitus with hypoglycemia without coma: Secondary | ICD-10-CM | POA: Diagnosis present

## 2019-07-27 DIAGNOSIS — K219 Gastro-esophageal reflux disease without esophagitis: Secondary | ICD-10-CM | POA: Diagnosis present

## 2019-07-27 DIAGNOSIS — Z794 Long term (current) use of insulin: Secondary | ICD-10-CM

## 2019-07-27 DIAGNOSIS — G8929 Other chronic pain: Secondary | ICD-10-CM | POA: Diagnosis present

## 2019-07-27 DIAGNOSIS — Z20828 Contact with and (suspected) exposure to other viral communicable diseases: Secondary | ICD-10-CM | POA: Diagnosis present

## 2019-07-27 DIAGNOSIS — F17200 Nicotine dependence, unspecified, uncomplicated: Secondary | ICD-10-CM | POA: Diagnosis present

## 2019-07-27 DIAGNOSIS — F1410 Cocaine abuse, uncomplicated: Secondary | ICD-10-CM | POA: Diagnosis present

## 2019-07-27 DIAGNOSIS — E1122 Type 2 diabetes mellitus with diabetic chronic kidney disease: Secondary | ICD-10-CM | POA: Diagnosis present

## 2019-07-27 DIAGNOSIS — N181 Chronic kidney disease, stage 1: Secondary | ICD-10-CM | POA: Diagnosis present

## 2019-07-27 DIAGNOSIS — E0810 Diabetes mellitus due to underlying condition with ketoacidosis without coma: Secondary | ICD-10-CM | POA: Diagnosis not present

## 2019-07-27 DIAGNOSIS — E1110 Type 2 diabetes mellitus with ketoacidosis without coma: Secondary | ICD-10-CM | POA: Diagnosis not present

## 2019-07-27 DIAGNOSIS — Z79899 Other long term (current) drug therapy: Secondary | ICD-10-CM

## 2019-07-27 DIAGNOSIS — M545 Low back pain: Secondary | ICD-10-CM | POA: Diagnosis present

## 2019-07-27 DIAGNOSIS — Z23 Encounter for immunization: Secondary | ICD-10-CM

## 2019-07-27 LAB — BASIC METABOLIC PANEL
BUN: 52 mg/dL — ABNORMAL HIGH (ref 6–20)
CO2: <7 mmol/L — ABNORMAL LOW (ref 22–32)
Calcium: 9.2 mg/dL (ref 8.9–10.3)
Chloride: 98 mmol/L (ref 98–111)
Creatinine, Ser: 3.3 mg/dL — ABNORMAL HIGH (ref 0.61–1.24)
GFR calc Af Amer: 22 mL/min — ABNORMAL LOW (ref >60)
GFR calc non Af Amer: 19 mL/min — ABNORMAL LOW (ref >60)
Glucose, Bld: 855 mg/dL (ref 70–99)
Potassium: 7.1 mmol/L (ref 3.5–5.1)
Sodium: 134 mmol/L — ABNORMAL LOW (ref 135–145)

## 2019-07-27 LAB — CBC WITH DIFFERENTIAL/PLATELET
Abs Immature Granulocytes: 0.5 10*3/uL — ABNORMAL HIGH (ref 0.00–0.07)
Basophils Absolute: 0.1 10*3/uL (ref 0.0–0.1)
Basophils Relative: 0 %
Eosinophils Absolute: 0 10*3/uL (ref 0.0–0.5)
Eosinophils Relative: 0 %
HCT: 46.3 % (ref 39.0–52.0)
Hemoglobin: 14 g/dL (ref 13.0–17.0)
Immature Granulocytes: 2 %
Lymphocytes Relative: 4 %
Lymphs Abs: 0.9 10*3/uL (ref 0.7–4.0)
MCH: 32.4 pg (ref 26.0–34.0)
MCHC: 30.2 g/dL (ref 30.0–36.0)
MCV: 107.2 fL — ABNORMAL HIGH (ref 80.0–100.0)
Monocytes Absolute: 1 10*3/uL (ref 0.1–1.0)
Monocytes Relative: 5 %
Neutro Abs: 19.9 10*3/uL — ABNORMAL HIGH (ref 1.7–7.7)
Neutrophils Relative %: 89 %
Platelets: 322 10*3/uL (ref 150–400)
RBC: 4.32 MIL/uL (ref 4.22–5.81)
RDW: 13.4 % (ref 11.5–15.5)
WBC: 22.4 10*3/uL — ABNORMAL HIGH (ref 4.0–10.5)
nRBC: 0 % (ref 0.0–0.2)

## 2019-07-27 LAB — GLUCOSE, CAPILLARY
Glucose-Capillary: >600 mg/dL (ref 70–99)
Glucose-Capillary: >600 mg/dL (ref 70–99)
Glucose-Capillary: >600 mg/dL (ref 70–99)
Glucose-Capillary: 471 mg/dL — ABNORMAL HIGH (ref 70–99)
Glucose-Capillary: 364 mg/dL — ABNORMAL HIGH (ref 70–99)
Glucose-Capillary: 320 mg/dL — ABNORMAL HIGH (ref 70–99)

## 2019-07-27 LAB — URINE DRUG SCREEN, QUALITATIVE (ARMC ONLY)
Amphetamines, Ur Screen: NOT DETECTED
Barbiturates, Ur Screen: NOT DETECTED
Benzodiazepine, Ur Scrn: NOT DETECTED
Cannabinoid 50 Ng, Ur ~~LOC~~: NOT DETECTED
Cocaine Metabolite,Ur ~~LOC~~: POSITIVE — AB
MDMA (Ecstasy)Ur Screen: NOT DETECTED
Methadone Scn, Ur: NOT DETECTED
Opiate, Ur Screen: NOT DETECTED
Phencyclidine (PCP) Ur S: NOT DETECTED
Tricyclic, Ur Screen: NOT DETECTED

## 2019-07-27 LAB — MRSA PCR SCREENING: MRSA by PCR: NEGATIVE

## 2019-07-27 LAB — TROPONIN I (HIGH SENSITIVITY): Troponin I (High Sensitivity): 12 ng/L (ref <18)

## 2019-07-27 LAB — BLOOD GAS, ARTERIAL
Acid-base deficit: 11.4 mmol/L — ABNORMAL HIGH (ref 0.0–2.0)
Bicarbonate: 12.4 mmol/L — ABNORMAL LOW (ref 20.0–28.0)
FIO2: 0.21
O2 Saturation: 96 %
Patient temperature: 37
pCO2 arterial: 23 mmHg — ABNORMAL LOW (ref 32.0–48.0)
pH, Arterial: 7.34 — ABNORMAL LOW (ref 7.350–7.450)
pO2, Arterial: 87 mmHg (ref 83.0–108.0)

## 2019-07-27 LAB — BETA-HYDROXYBUTYRIC ACID: Beta-Hydroxybutyric Acid: >8 mmol/L — ABNORMAL HIGH (ref 0.05–0.27)

## 2019-07-27 LAB — BRAIN NATRIURETIC PEPTIDE: B Natriuretic Peptide: 122 pg/mL — ABNORMAL HIGH (ref 0.0–100.0)

## 2019-07-27 LAB — SARS CORONAVIRUS 2 BY RT PCR (HOSPITAL ORDER, PERFORMED IN ~~LOC~~ HOSPITAL LAB): SARS Coronavirus 2: NEGATIVE

## 2019-07-27 LAB — ETHANOL: Alcohol, Ethyl (B): <10 mg/dL (ref <10)

## 2019-07-27 MED ORDER — SODIUM BICARBONATE 8.4 % IV SOLN
50.0000 meq | Freq: Once | INTRAVENOUS | Status: AC
  Administered 2019-07-27: 19:00:00 50 meq via INTRAVENOUS
  Filled 2019-07-27: qty 50

## 2019-07-27 MED ORDER — INSULIN REGULAR(HUMAN) IN NACL 100-0.9 UT/100ML-% IV SOLN
INTRAVENOUS | Status: DC
  Administered 2019-07-27: 5.4 [IU]/h via INTRAVENOUS
  Administered 2019-07-28: 1.3 [IU]/h via INTRAVENOUS
  Filled 2019-07-27 (×2): qty 100

## 2019-07-27 MED ORDER — INSULIN REGULAR BOLUS VIA INFUSION
10.0000 [IU] | Freq: Once | INTRAVENOUS | Status: AC
  Administered 2019-07-27: 10 [IU] via INTRAVENOUS
  Filled 2019-07-27: qty 10

## 2019-07-27 MED ORDER — LACTATED RINGERS IV SOLN
INTRAVENOUS | Status: DC
  Administered 2019-07-27: 23:00:00 via INTRAVENOUS

## 2019-07-27 MED ORDER — FOLIC ACID 1 MG PO TABS
1.0000 mg | ORAL_TABLET | Freq: Every day | ORAL | Status: DC
  Administered 2019-07-27 – 2019-07-29 (×3): 1 mg via ORAL
  Filled 2019-07-27 (×3): qty 1

## 2019-07-27 MED ORDER — SODIUM CHLORIDE 0.9 % IV SOLN
Freq: Once | INTRAVENOUS | Status: AC
  Administered 2019-07-27: 18:00:00 via INTRAVENOUS

## 2019-07-27 MED ORDER — INSULIN ASPART 100 UNIT/ML ~~LOC~~ SOLN: 10.0000 [IU] | Freq: Once | SUBCUTANEOUS | Status: DC

## 2019-07-27 MED ORDER — CHLORHEXIDINE GLUCONATE CLOTH 2 % EX PADS
6.0000 | MEDICATED_PAD | Freq: Every day | CUTANEOUS | Status: DC
  Administered 2019-07-27: 6 via TOPICAL

## 2019-07-27 MED ORDER — SODIUM CHLORIDE 0.9 % IV SOLN: INTRAVENOUS | Status: DC

## 2019-07-27 MED ORDER — LORAZEPAM 2 MG/ML IJ SOLN
1.0000 mg | INTRAMUSCULAR | Status: DC | PRN
  Administered 2019-07-28: 2 mg via INTRAVENOUS
  Filled 2019-07-27: qty 1

## 2019-07-27 MED ORDER — LORAZEPAM 1 MG PO TABS
1.0000 mg | ORAL_TABLET | ORAL | Status: DC | PRN
  Administered 2019-07-29: 09:00:00 1 mg via ORAL
  Filled 2019-07-27: qty 1

## 2019-07-27 MED ORDER — SODIUM CHLORIDE 0.9 % IV SOLN
Freq: Once | INTRAVENOUS | Status: AC
  Administered 2019-07-27: 19:00:00 via INTRAVENOUS

## 2019-07-27 MED ORDER — SODIUM CHLORIDE 0.9 % IV SOLN: 1.0000 g | Freq: Once | INTRAVENOUS | Status: DC

## 2019-07-27 MED ORDER — INSULIN ASPART 100 UNIT/ML ~~LOC~~ SOLN
10.0000 [IU] | Freq: Once | SUBCUTANEOUS | Status: AC
  Administered 2019-07-27: 10 [IU] via INTRAVENOUS
  Filled 2019-07-27: qty 1

## 2019-07-27 MED ORDER — ONDANSETRON HCL 4 MG/2ML IJ SOLN
4.0000 mg | Freq: Once | INTRAMUSCULAR | Status: AC
  Administered 2019-07-27: 4 mg via INTRAVENOUS
  Filled 2019-07-27: qty 2

## 2019-07-27 MED ORDER — DEXTROSE-NACL 5-0.45 % IV SOLN
INTRAVENOUS | Status: DC
  Administered 2019-07-28: 02:00:00 via INTRAVENOUS

## 2019-07-27 MED ORDER — HEPARIN SODIUM (PORCINE) 5000 UNIT/ML IJ SOLN
5000.0000 [IU] | Freq: Three times a day (TID) | INTRAMUSCULAR | Status: DC
  Administered 2019-07-27 – 2019-07-29 (×5): 5000 [IU] via SUBCUTANEOUS
  Filled 2019-07-27 (×5): qty 1

## 2019-07-27 MED ORDER — ADULT MULTIVITAMIN W/MINERALS CH
1.0000 | ORAL_TABLET | Freq: Every day | ORAL | Status: DC
  Administered 2019-07-27 – 2019-07-29 (×3): 1 via ORAL
  Filled 2019-07-27 (×3): qty 1

## 2019-07-27 MED ORDER — CALCIUM GLUCONATE-NACL 1-0.675 GM/50ML-% IV SOLN
1.0000 g | Freq: Once | INTRAVENOUS | Status: AC
  Administered 2019-07-27: 1000 mg via INTRAVENOUS
  Filled 2019-07-27: qty 50

## 2019-07-27 MED ORDER — STERILE WATER FOR INJECTION IV SOLN
INTRAVENOUS | Status: DC
  Administered 2019-07-27 – 2019-07-28 (×2): via INTRAVENOUS
  Filled 2019-07-27 (×6): qty 850

## 2019-07-27 MED ORDER — THIAMINE HCL 100 MG/ML IJ SOLN: 100.0000 mg | Freq: Every day | INTRAMUSCULAR | Status: DC

## 2019-07-27 MED ORDER — SODIUM CHLORIDE 0.9 % IV SOLN
1.0000 g | Freq: Once | INTRAVENOUS | Status: DC
  Filled 2019-07-27: qty 10

## 2019-07-27 MED ORDER — VITAMIN B-1 100 MG PO TABS
100.0000 mg | ORAL_TABLET | Freq: Every day | ORAL | Status: DC
  Administered 2019-07-27 – 2019-07-29 (×3): 100 mg via ORAL
  Filled 2019-07-27 (×3): qty 1

## 2019-07-27 NOTE — ED Triage Notes (Signed)
Pt presnets to ED via [facility] with c/o hyperglycemia and SOB. Per EMS pt was recently admitted for DKA, per EMS pt reports taking his insulin today, EMS also reports pt slipped on wet ground yesterday and fell on his back. Pt hesitant to answer questions upon arrival, however is able to answer questions, pt repeatedly states "I just don't feel good".

## 2019-07-27 NOTE — ED Notes (Signed)
ED TO INPATIENT HANDOFF REPORT  ED Nurse Name and Phone #: Ondine Gemme [phone]  S Name/Age/Gender Spencer [name] 58 y.o. male Room/Bed: ED04A/ED04A  Code Status   Code Status: Full Code  Home/SNF/Other Home Patient oriented to: self, place and situation Is this baseline? No   Triage Complete: Triage complete  Chief Complaint DKA  Triage Note Pt presnets to ED via [facility] with c/o hyperglycemia and SOB. Per EMS pt was recently admitted for DKA, per EMS pt reports taking his insulin today, EMS also reports pt slipped on wet ground yesterday and fell on his back. Pt hesitant to answer questions upon arrival, however is able to answer questions, pt repeatedly states "I just don't feel good".    Allergies No Known Allergies  Level of Care/Admitting Diagnosis ED Disposition    ED Disposition Condition Comment   Admit  [facility]ospital [facility] [[id number]]  Level of Care: ICU [6]  Covid Evaluation: Asymptomatic Screening Protocol (No Symptoms)  Diagnosis: DKA (diabetic ketoacidoses) [facility](HCC) [161096]) [[id number]]  Admitting Physician: Jama FlavorsJIE, JUDE [[id number]]  Attending Physician: Jama FlavorsJIE, JUDE [[id number]]  Estimated length of stay: past midnight tomorrow  Certification:: I certify this patient will need inpatient services for at least 2 midnights  PT Class (Do Not Modify): Inpatient [101]  PT Acc Code (Do Not Modify): Private [1]       B Medical/Surgery History Past Medical History:  Diagnosis Date  . Brain aneurysm   . Broken bones    clavical, ankle, arms toes wriast   . Diabetes mellitus without complication (HCC)   . Dysrhythmia   . GERD (gastroesophageal reflux disease)   . Hypertension   . Substance abuse [facility](HCC)    Past Surgical History:  Procedure Laterality Date  . ESOPHAGOGASTRODUODENOSCOPY (EGD) WITH PROPOFOL N/A 06/04/2019   Procedure: ESOPHAGOGASTRODUODENOSCOPY (EGD) WITH PROPOFOL;  Surgeon: Toledo, Boykin Nearingeodoro K, MD;  Location: [facility] ENDOSCOPY;  Service: Gastroenterology;   Laterality: N/A;  . HERNIA REPAIR    . IMPLANTATION / PLACEMENT OF STRIP ELECTRODES VIA BURR HOLES SUBDURAL     anyrusum  . NECK SURGERY    . TEE WITHOUT CARDIOVERSION N/A 06/03/2019   Procedure: TRANSESOPHAGEAL ECHOCARDIOGRAM (TEE);  Surgeon: Dalia HeadingFath, Kenneth A, MD;  Location: [facility] ORS;  Service: Cardiovascular;  Laterality: N/A;     A IV Location/Drains/Wounds Patient Lines/Drains/Airways Status   Active Line/Drains/Airways    Name:   Placement date:   Placement time:   Site:   Days:   Peripheral IV 07/27/19 Right;Posterior Hand   07/27/19    1732    Hand   less than 1   Peripheral IV 07/27/19 Right Forearm   07/27/19    1757    Forearm   less than 1   Peripheral IV 07/27/19 Left Forearm   07/27/19    1855    Forearm   less than 1   Peripheral IV 07/27/19 Left Forearm   07/27/19    1856    Forearm   less than 1          Intake/Output Last 24 hours  Intake/Output Summary (Last 24 hours) at 07/27/2019 1947 Last data filed at 07/27/2019 1732 Gross per 24 hour  Intake 250 ml  Output -  Net 250 ml    Labs/Imaging Results for orders placed or performed during the hospital encounter of 07/27/19 (from the past 48 hour(s))  CBC with Differential     Status: Abnormal   Collection Time: 07/27/19  5:41 PM  Result Value Ref Range  WBC 22.4 (H) 4.0 - 10.5 K/uL   RBC 4.32 4.22 - 5.81 MIL/uL   Hemoglobin 14.0 13.0 - 17.0 g/dL   HCT 46.3 39.0 - 52.0 %   MCV 107.2 (H) 80.0 - 100.0 fL   MCH 32.4 26.0 - 34.0 pg   MCHC 30.2 30.0 - 36.0 g/dL   RDW 13.4 11.5 - 15.5 %   Platelets 322 150 - 400 K/uL   nRBC 0.0 0.0 - 0.2 %   Neutrophils Relative % 89 %   Neutro Abs 19.9 (H) 1.7 - 7.7 K/uL   Lymphocytes Relative 4 %   Lymphs Abs 0.9 0.7 - 4.0 K/uL   Monocytes Relative 5 %   Monocytes Absolute 1.0 0.1 - 1.0 K/uL   Eosinophils Relative 0 %   Eosinophils Absolute 0.0 0.0 - 0.5 K/uL   Basophils Relative 0 %   Basophils Absolute 0.1 0.0 - 0.1 K/uL   Immature Granulocytes 2 %   Abs Immature  Granulocytes 0.50 (H) 0.00 - 0.07 K/uL    Comment: Performed at [facility], [address], [place], [place] [address]  Basic metabolic panel     Status: Abnormal   Collection Time: 07/27/19  5:41 PM  Result Value Ref Range   Sodium 134 (L) 135 - 145 mmol/L   Potassium 7.1 (HH) 3.5 - 5.1 mmol/L    Comment: CRITICAL RESULT CALLED TO, READ BACK BY AND VERIFIED WITH KATIE FERGUSON @1834  07/27/19 MJU    Chloride 98 98 - 111 mmol/L   CO2 <7 (L) 22 - 32 mmol/L   Glucose, Bld 855 (HH) 70 - 99 mg/dL    Comment: CRITICAL RESULT CALLED TO, READ BACK BY AND VERIFIED WITH KATIE FERGUSSON @1834  07/27/19 MJU    BUN 52 (H) 6 - 20 mg/dL   Creatinine, Ser 3.30 (H) 0.61 - 1.24 mg/dL   Calcium 9.2 8.9 - 10.3 mg/dL   GFR calc non Af Amer 19 (L) >60 mL/min   GFR calc Af Amer 22 (L) >60 mL/min   Anion gap NOT CALCULATED 5 - 15    Comment: Performed at [facility], [address] [place], [place] [address]  Brain natriuretic peptide     Status: Abnormal   Collection Time: 07/27/19  5:41 PM  Result Value Ref Range   B Natriuretic Peptide 122.0 (H) 0.0 - 100.0 pg/mL    Comment: Performed at [facility], [address] [place], [place] [address]  Beta-hydroxybutyric acid     Status: Abnormal   Collection Time: 07/27/19  5:41 PM  Result Value Ref Range   Beta-Hydroxybutyric Acid >8.00 (H) 0.05 - 0.27 mmol/L    Comment: RESULT CONFIRMED BY MANUAL DILUTION MJU Performed at [facility], [address] [place],  [address]   Blood gas, venous     Status: Abnormal (Preliminary result)   Collection Time: 07/27/19  5:41 PM  Result Value Ref Range   pH, Ven PENDING 7.250 - 7.430   pCO2, Ven 19 (LL) 44.0 - 60.0 mmHg    Comment: CRITICAL RESULT CALLED TO, READ BACK BY AND VERIFIED WITH: CALLED TO KATIE FERGUSON RN Jul 27 2019 AT 1800    pO2, Ven PENDING 32.0 - 45.0 mmHg   Bicarbonate 3.9 (L) 20.0 - 28.0 mmol/L   Acid-base deficit 27.5 (H) 0.0 - 2.0 mmol/L    O2 Saturation 36.9 %   Patient temperature 37.0    Collection site VENOUS    Sample type VENOUS  Comment: Performed at [facility], [address] [place], [place] [address]  Troponin I (High Sensitivity)     Status: None   Collection Time: 07/27/19  5:41 PM  Result Value Ref Range   Troponin I (High Sensitivity) 12 <18 ng/L    Comment: (NOTE) Elevated high sensitivity troponin I (hsTnI) values and significant  changes across serial measurements may suggest ACS but many other  chronic and acute conditions are known to elevate hsTnI results.  Refer to the Links section for chest pain algorithms and additional  guidance. Performed at [facility], [address] [place], [place] [address]   Ethanol     Status: None   Collection Time: 07/27/19  5:41 PM  Result Value Ref Range   Alcohol, Ethyl (B) <10 <10 mg/dL    Comment: (NOTE) Lowest detectable limit for serum alcohol is 10 mg/dL. For medical purposes only. Performed at [facility], [address] [place], [place] [address]   Glucose, capillary     Status: Abnormal   Collection Time: 07/27/19  5:47 PM  Result Value Ref Range   Glucose-Capillary >600 (HH) 70 - 99 mg/dL  Glucose, capillary     Status: Abnormal   Collection Time: 07/27/19  6:21 PM  Result Value Ref Range   Glucose-Capillary >600 (HH) 70 - 99 mg/dL  SARS Coronavirus 2 [facility](Hospital order, Performed in [facility] hospital lab) Nasopharyngeal Nasopharyngeal Swab     Status: None   Collection Time: 07/27/19  6:28 PM   Specimen: Nasopharyngeal Swab  Result Value Ref Range   SARS Coronavirus 2 NEGATIVE NEGATIVE    Comment: (NOTE) If result is NEGATIVE SARS-CoV-2 target nucleic acids are NOT DETECTED. The SARS-CoV-2 RNA is generally detectable in upper and lower  respiratory specimens during the acute phase of infection. The lowest  concentration of SARS-CoV-2 viral copies this assay can detect is 250  copies / mL. A negative  result does not preclude SARS-CoV-2 infection  and should not be used as the sole basis for treatment or other  patient management decisions.  A negative result may occur with  improper specimen collection / handling, submission of specimen other  than nasopharyngeal swab, presence of viral mutation(s) within the  areas targeted by this assay, and inadequate number of viral copies  (<250 copies / mL). A negative result must be combined with clinical  observations, patient history, and epidemiological information. If result is POSITIVE SARS-CoV-2 target nucleic acids are DETECTED. The SARS-CoV-2 RNA is generally detectable in upper and lower  respiratory specimens dur ing the acute phase of infection.  Positive  results are indicative of active infection with SARS-CoV-2.  Clinical  correlation with patient history and other diagnostic information is  necessary to determine patient infection status.  Positive results do  not rule out bacterial infection or co-infection with other viruses. If result is PRESUMPTIVE POSTIVE SARS-CoV-2 nucleic acids MAY BE PRESENT.   A presumptive positive result was obtained on the submitted specimen  and confirmed on repeat testing.  While 2019 novel coronavirus  (SARS-CoV-2) nucleic acids may be present in the submitted sample  additional confirmatory testing may be necessary for epidemiological  and / or clinical management purposes  to differentiate between  SARS-CoV-2 and other Sarbecovirus currently known to infect humans.  If clinically indicated additional testing with an alternate test  methodology [phone] is advised. The SARS-CoV-2 RNA is generally  detectable in upper and lower respiratory sp ecimens during the acute  phase  of infection. The expected result is Negative. Fact Sheet for Patients:  BoilerBrush.com.cy Fact Sheet for Healthcare Providers: https://pope.com/ This test is not yet  approved or cleared by the [place] FDA and has been authorized for detection and/or diagnosis of SARS-CoV-2 by FDA under an Emergency Use Authorization (EUA).  This EUA will remain in effect (meaning this test can be used) for the duration of the COVID-19 declaration under Section 564(b)(1) of the Act, 21 U.S.C. section 360bbb-3(b)(1), unless the authorization is terminated or revoked sooner. Performed at [facility], [address] [place] , [place] [address]   Glucose, capillary     Status: Abnormal   Collection Time: 07/27/19  7:37 PM  Result Value Ref Range   Glucose-Capillary >600 (HH) 70 - 99 mg/dL   Dg Chest Port 1 View  Result Date: 07/27/2019 CLINICAL DATA:  Dyspnea EXAM: PORTABLE CHEST 1 VIEW COMPARISON:  06/09/2019 FINDINGS: The heart size and mediastinal contours are stable. Both lungs are clear. The visualized skeletal structures are unremarkable. Two metallic BBs again seen projecting over the lower right hemithorax. IMPRESSION: No active disease. Electronically Signed   By: Duanne Guess M.D.   On: 07/27/2019 18:07    Pending Labs Unresulted Labs (From admission, onward)    Start     Ordered   07/28/19 0700  Blood gas, arterial  Once,   STAT     07/27/19 1927   07/28/19 0500  Basic metabolic panel  Tomorrow morning,   STAT     07/27/19 1927   07/28/19 0500  CBC  Tomorrow morning,   STAT     07/27/19 1927   07/28/19 0500  Magnesium  Tomorrow morning,   STAT     07/27/19 1927   07/28/19 0500  Phosphorus  Tomorrow morning,   STAT     07/27/19 1927   07/27/19 1940  Urine Drug Screen, Qualitative ([facility] only)  Once,   STAT     07/27/19 1939   07/27/19 1921  Magnesium  Once,   STAT     07/27/19 1922   07/27/19 1921  Phosphorus  Once,   STAT     07/27/19 1922   07/27/19 1919  Basic metabolic panel  STAT Now then every 4 hours ,   STAT     07/27/19 1922   07/27/19 1734  Urine Drug Screen, Qualitative ([facility] only)  Once,   STAT     07/27/19 1733           Vitals/Pain Today's Vitals   07/27/19 1732 07/27/19 1735 07/27/19 1736 07/27/19 1908  BP:  (!) 158/92  127/85  Pulse:  (!) 112  (!) 107  Resp:  (!) 26  (!) 26  TempSrc:  Oral    SpO2: 100% 100%  100%  Weight:   72.6 kg   Height:   6\' 1"  (1.854 m)   PainSc:   10-Worst pain ever     Isolation Precautions No active isolations  Medications Medications  insulin regular, human (MYXREDLIN) 100 units/ 100 mL infusion (10.8 Units/hr Intravenous Rate/Dose Change 07/27/19 1940)  sodium bicarbonate 150 mEq in sterile water 1,000 mL infusion ( Intravenous New Bag/Given 07/27/19 1945)  calcium gluconate 1 g/ 50 mL sodium chloride IVPB (1,000 mg Intravenous New Bag/Given 07/27/19 1906)  dextrose 5 %-0.45 % sodium chloride infusion (has no administration in time range)  heparin injection 5,000 Units (has no administration in time range)  0.9 %  sodium chloride infusion ( Intravenous New Bag/Given 07/27/19  1802)  insulin aspart (novoLOG) injection 10 Units (10 Units Intravenous Given 07/27/19 1756)  ondansetron (ZOFRAN) injection 4 mg (4 mg Intravenous Given 07/27/19 1818)  sodium bicarbonate injection 50 mEq (50 mEq Intravenous Given 07/27/19 1907)  0.9 %  sodium chloride infusion ( Intravenous New Bag/Given 07/27/19 1858)  insulin regular bolus via infusion 10 Units (10 Units Intravenous Bolus from Bag 07/27/19 1904)    Mobility walks High fall risk   Focused Assessments Pulmonary Assessment Handoff:  Lung sounds:   O2 Device: Room Air        R Recommendations: See Admitting Provider Note  Report given to:   Additional Notes: sister [name] aware of POC

## 2019-07-27 NOTE — ED Notes (Signed)
secondl call att

## 2019-07-27 NOTE — ED Notes (Signed)
Date and time results received: 07/27/19 6:09 PM  (use smartphrase ".now" to insert current time)  Test: pH  Critical Value: 6.91  Name of Provider Notified: Dr. Jimmye Norman  Orders Received? Or Actions Taken?: See orders

## 2019-07-27 NOTE — ED Notes (Signed)
Date and time results received: 07/27/19 18:34   Test: Potassium Critical Value: 7.1  Test: Glucose Critical Value: 855  Name of Provider Notified: Dr Jimmye Norman  See orders.

## 2019-07-27 NOTE — Consult Note (Signed)
PULMONARY / CRITICAL CARE MEDICINE  Name: Spencer [name] MRN: [id number] DOB: [birth year]    LOS: 0  Referring Provider:  Dr. Stark Jock Reason for Referral:  DKA and severe metabolic acidosis  HPI: This is a 58 year old male with a history of type 2 diabetes, multiple hospitalizations for DKA, and hypoglycemia who presented to the ED with complaints of hyperglycemia, nausea, vomiting, altered mental status and dyspnea.  His ED work-up revealed a blood glucose level of 855 mg/dL, potassium of 7.1 and a creatinine of 3.3.  His VBG showed a pH of 6.9.  He is being admitted to the ICU for further management. Past Medical History:  Diagnosis Date  . Brain aneurysm   . Broken bones    clavical, ankle, arms toes wriast   . Diabetes mellitus without complication ([place])   . Dysrhythmia   . GERD (gastroesophageal reflux disease)   . Hypertension   . Substance abuse [facility])    Past Surgical History:  Procedure Laterality Date  . ESOPHAGOGASTRODUODENOSCOPY (EGD) WITH PROPOFOL N/A 06/04/2019   Procedure: ESOPHAGOGASTRODUODENOSCOPY (EGD) WITH PROPOFOL;  Surgeon: Toledo, Benay Pike, MD;  Location: [facility] ENDOSCOPY;  Service: Gastroenterology;  Laterality: N/A;  . HERNIA REPAIR    . IMPLANTATION / PLACEMENT OF STRIP ELECTRODES VIA BURR HOLES SUBDURAL     anyrusum  . NECK SURGERY    . TEE WITHOUT CARDIOVERSION N/A 06/03/2019   Procedure: TRANSESOPHAGEAL ECHOCARDIOGRAM (TEE);  Surgeon: Teodoro Spray, MD;  Location: [facility] ORS;  Service: Cardiovascular;  Laterality: N/A;   No current facility-administered medications on file prior to encounter.    Current Outpatient Medications on File Prior to Encounter  Medication Sig  . blood glucose meter kit and supplies Check blood sugar 4 times daily  . insulin glargine (LANTUS) 100 UNIT/ML injection Inject 40 Units into the skin daily.  . Insulin Pen Needle 31G X 5 MM MISC Check blood sugars 4 times daily  . metFORMIN (GLUCOPHAGE) 1000 MG tablet Take 1,000 mg by mouth 2  (two) times daily.  Marland Kitchen NOVOLOG FLEXPEN 100 UNIT/ML FlexPen Inject 0-30 Units into the skin 3 (three) times daily.  . pantoprazole (PROTONIX) 40 MG tablet Take 1 tablet (40 mg total) by mouth daily.    Allergies No Known Allergies  Family History Family History  Problem Relation Age of Onset  . CAD Sister   . Hypertension Sister   . Healthy Mother   . Healthy Father    Social History  reports that he has been smoking. He has never used smokeless tobacco. He reports current alcohol use of about 1.0 standard drinks of alcohol per week. He reports previous drug use. Drug: Cocaine.  Review Of Systems: Unable to obtain as patient is somnolent  VITAL SIGNS: BP (!) 155/83 (BP Location: Right Arm)   Pulse 92   Temp (!) 97.5 F (36.4 C) (Oral)   Resp (!) 31   Ht 6' (1.829 m)   Wt 61.7 kg   SpO2 100%   BMI 18.45 kg/m   HEMODYNAMICS:    VENTILATOR SETTINGS:    INTAKE / OUTPUT: I/O last 3 completed shifts: In: 250 [I.V.:250] Out: -   PHYSICAL EXAMINATION: General: Acutely ill looking HEENT: PERRLA, trachea midline, no JVD Neuro: Somnolent, opens eyes to voice and noxious stimulus, moves all extremities Cardiovascular: Apical pulse regular, S1-S2, no murmur regurg or gallop, +2 pulses bilaterally Lungs: Clear to auscultation bilaterally with Kussmaul's respirations Abdomen: Nondistended, normal bowel sounds in all 4 quadrants, palpation reveals no organomegaly Musculoskeletal:  No joint deformity Skin: Warm and dry  LABS:  BMET Recent Labs  Lab 07/27/19 1741  NA 134*  K 7.1*  CL 98  CO2 <7*  BUN 52*  CREATININE 3.30*  GLUCOSE 855*    Electrolytes Recent Labs  Lab 07/27/19 1741  CALCIUM 9.2    CBC Recent Labs  Lab 07/27/19 1741  WBC 22.4*  HGB 14.0  HCT 46.3  PLT 322    Coag's No results for input(s): APTT, INR in the last 168 hours.  Sepsis Markers No results for input(s): LATICACIDVEN, PROCALCITON, O2SATVEN in the last 168 hours.  ABG No  results for input(s): PHART, PCO2ART, PO2ART in the last 168 hours.  Liver Enzymes No results for input(s): AST, ALT, ALKPHOS, BILITOT, ALBUMIN in the last 168 hours.  Cardiac Enzymes No results for input(s): TROPONINI, PROBNP in the last 168 hours.  Glucose Recent Labs  Lab 07/27/19 1747 07/27/19 1821 07/27/19 1937 07/27/19 2036  GLUCAP >600* >600* >600* 471*    Imaging Dg Abd 1 View  Result Date: 07/27/2019 CLINICAL DATA:  Abdomen pain, low back pain after fall EXAM: ABDOMEN - 1 VIEW COMPARISON:  11/05/2016 FINDINGS: Nonobstructed bowel gas pattern with large amount of stool in the colon. Possible acute right tenth nondisplaced rib fracture. Phleboliths in the pelvis. IMPRESSION: 1. Nonobstructed gas pattern with large amount of stool in the colon 2. Possible nondisplaced right tenth rib fracture Electronically Signed   By: Donavan Foil M.D.   On: 07/27/2019 20:29   Dg Chest Port 1 View  Result Date: 07/27/2019 CLINICAL DATA:  Dyspnea EXAM: PORTABLE CHEST 1 VIEW COMPARISON:  06/09/2019 FINDINGS: The heart size and mediastinal contours are stable. Both lungs are clear. The visualized skeletal structures are unremarkable. Two metallic BBs again seen projecting over the lower right hemithorax. IMPRESSION: No active disease. Electronically Signed   By: Davina Poke M.D.   On: 07/27/2019 18:07   SIGNIFICANT EVENTS: 07/27/2019: Admitted  LINES/TUBES: Peripheral IV  DISCUSSION: 58 year old male admitted with DKA, acute on chronic renal failure and severe lactic acidosis with metabolic encephalopathy  ASSESSMENT DKA Acute on chronic renal failure Severe lactic acidosis Metabolic encephalopathy Severe DKA with no EKG changes  PLAN DKA protocol Bicarb infusion Trend renal indices Monitor and correct electrolytes ABG x1 now and then as needed  Best Practice: Code Status: Full code Diet: N.p.o. while on insulin drip and carb modified diet once off insulin drip GI  prophylaxis: Not indicated VTE prophylaxis: SCDs and subcu heparin  FAMILY  - Updates: Patient's family updated by admitting service.  Will update with any new changes in treatment plan  Roseanne Juenger S. Genoveva Ill ANP-BC Pulmonary and [place] Pager [phone] or [phone]  NB: This document was prepared using Dragon voice recognition software and may include unintentional dictation errors.    07/27/2019, 9:11 PM

## 2019-07-27 NOTE — ED Notes (Signed)
X-ray at bedside att 

## 2019-07-27 NOTE — ED Notes (Signed)
POC updated with pt's sister, [name] [phone], with pt's approval on speaker phone

## 2019-07-27 NOTE — ED Provider Notes (Signed)
[facility] Emergency Department Provider Note       Time seen: ----------------------------------------- 5:33 PM on 07/27/2019 -----------------------------------------   I have reviewed the triage vital signs and the nursing notes.  HISTORY   Chief Complaint Hyperglycemia and Shortness of Breath    HPI Spencer [name] is a 58 y.o. male with a history of aneurysm, diabetes, dysrhythmia, GERD, hypertension, substance abuse, sepsis who presents to the ED for shortness of breath.  Patient reportedly had elevated blood sugar, recently admitted for DKA.  Patient states he does not feel well, denies any specific complaints, presents tachypneic.  Past Medical History:  Diagnosis Date  . Brain aneurysm   . Broken bones    clavical, ankle, arms toes wriast   . Diabetes mellitus without complication ([place])   . Dysrhythmia   . GERD (gastroesophageal reflux disease)   . Hypertension   . Substance abuse [facility])     Patient Active Problem List   Diagnosis Date Noted  . Sepsis ([place]) 06/09/2019  . DKA (diabetic ketoacidoses) ([place]) 05/28/2019  . AKI (acute kidney injury) ([place]) 02/19/2019  . Hypoglycemia 10/23/2018  . Chronic low back pain 01/16/2017  . Chronic neck pain 01/16/2017  . Diabetic neuropathy ([place]) 01/16/2017  . DM2 (diabetes mellitus, type 2) ([place]) 01/16/2017  . History of cocaine abuse ([place]) 01/16/2017  . Personal history of subdural hematoma 01/16/2017  . Closed displaced fracture of body of left calcaneus with delayed healing 12/13/2016  . Protein-calorie malnutrition, severe 11/08/2016  . DKA, type 2 ([place]) 11/06/2016  . Diabetic ketoacidosis ([place]) 04/03/2015  . DKA (diabetic ketoacidoses) ([place]) 04/03/2015  . Hypertension 04/03/2015  . Depression 04/03/2015  . Opiate dependence ([place]) 04/03/2015  . Tobacco abuse 04/03/2015  . Lumbosacral neuritis 07/19/2014  . Pain of finger of right hand 07/19/2014  . Type II or unspecified type diabetes  mellitus without mention of complication, not stated as uncontrolled 07/19/2014  . Knee pain 09/12/2013  . Hernia of flank 09/20/2012  . Epidermoid cyst of skin 09/20/2012  . Chronic pain of both shoulders 03/27/2012    Past Surgical History:  Procedure Laterality Date  . ESOPHAGOGASTRODUODENOSCOPY (EGD) WITH PROPOFOL N/A 06/04/2019   Procedure: ESOPHAGOGASTRODUODENOSCOPY (EGD) WITH PROPOFOL;  Surgeon: Toledo, Benay Pike, MD;  Location: [facility] ENDOSCOPY;  Service: Gastroenterology;  Laterality: N/A;  . HERNIA REPAIR    . IMPLANTATION / PLACEMENT OF STRIP ELECTRODES VIA BURR HOLES SUBDURAL     anyrusum  . NECK SURGERY    . TEE WITHOUT CARDIOVERSION N/A 06/03/2019   Procedure: TRANSESOPHAGEAL ECHOCARDIOGRAM (TEE);  Surgeon: Teodoro Spray, MD;  Location: [facility] ORS;  Service: Cardiovascular;  Laterality: N/A;    Allergies Patient has no known allergies.  Social History Social History   Tobacco Use  . Smoking status: Current Some Day Smoker  . Smokeless tobacco: Never Used  Substance Use Topics  . Alcohol use: Yes    Alcohol/week: 1.0 standard drinks    Types: 1 Cans of beer per week  . Drug use: Not Currently    Types: Cocaine   Review of Systems Constitutional: Negative for fever. Cardiovascular: Negative for chest pain. Respiratory: Positive for shortness of breath Gastrointestinal: Negative for abdominal pain, vomiting and diarrhea. Musculoskeletal: Negative for back pain. Skin: Negative for rash. Neurological: Negative for headaches, focal weakness or numbness.  All systems negative/normal/unremarkable except as stated in the HPI  ____________________________________________   PHYSICAL EXAM:  VITAL SIGNS: ED Triage Vitals [07/27/19 1732]  Enc Vitals Group  BP      Pulse      Resp      Temp      Temp src      SpO2 100 %     Weight      Height      Head Circumference      Peak Flow      Pain Score      Pain Loc      Pain Edu?      Excl. in GC?     Constitutional: Drowsy but arouses easily, moderate distress.  Disheveled appearance Eyes: Conjunctivae are normal. Normal extraocular movements. ENT      Head: Normocephalic and atraumatic.      Nose: No congestion/rhinnorhea.      Mouth/Throat: Mucous membranes are moist.      Neck: No stridor. Cardiovascular: Rapid rate, regular rhythm. No murmurs, rubs, or gallops. Respiratory: Tachypnea with clear breath sounds Gastrointestinal: Soft and nontender. Normal bowel sounds Musculoskeletal: Nontender with normal range of motion in extremities. No lower extremity tenderness nor edema. Neurologic:  Generalized weakness, nothing focal Skin:  Skin is warm, dry and intact. No rash noted. Psychiatric: Flat affect ____________________________________________  EKG: Interpreted by me.  Sinus tachycardia with rate of 113 bpm, extensive artifact and peak T waves are noted, normal axis, normal QT  ____________________________________________  ED COURSE:  As part of my medical decision making, I reviewed the following data within the electronic MEDICAL RECORD NUMBER History obtained from family if available, nursing notes, old chart and ekg, as well as notes from prior ED visits. Patient presented for tachypnea with hyperglycemia, we will assess with labs and imaging as indicated at this time.   Procedures  Spencer [name] was evaluated in Emergency Department on 07/27/2019 for the symptoms described in the history of present illness. He was evaluated in the context of the global COVID-19 pandemic, which necessitated consideration that the patient might be at risk for infection with the SARS-CoV-2 virus that causes COVID-19. Institutional protocols and algorithms that pertain to the evaluation of patients at risk for COVID-19 are in a state of rapid change based on information released by regulatory bodies including the CDC and federal and state organizations. These policies and algorithms were followed  during the patient's care in the ED.  ____________________________________________   LABS (pertinent positives/negatives)  Labs Reviewed  CBC WITH DIFFERENTIAL/PLATELET - Abnormal; Notable for the following components:      Result Value   WBC 22.4 (*)    MCV 107.2 (*)    Neutro Abs 19.9 (*)    Abs Immature Granulocytes 0.50 (*)    All other components within normal limits  BASIC METABOLIC PANEL - Abnormal; Notable for the following components:   Sodium 134 (*)    Potassium 7.1 (*)    CO2 <7 (*)    Glucose, Bld 855 (*)    BUN 52 (*)    Creatinine, Ser 3.30 (*)    GFR calc non Af Amer 19 (*)    GFR calc Af Amer 22 (*)    All other components within normal limits  BRAIN NATRIURETIC PEPTIDE - Abnormal; Notable for the following components:   B Natriuretic Peptide 122.0 (*)    All other components within normal limits  BLOOD GAS, VENOUS - Abnormal; Notable for the following components:   pCO2, Ven 19 (*)    Bicarbonate 3.9 (*)    Acid-base deficit 27.5 (*)    All other components within normal  limits  GLUCOSE, CAPILLARY - Abnormal; Notable for the following components:   Glucose-Capillary >600 (*)    All other components within normal limits  GLUCOSE, CAPILLARY - Abnormal; Notable for the following components:   Glucose-Capillary >600 (*)    All other components within normal limits  SARS CORONAVIRUS 2 (HOSPITAL ORDER, PERFORMED IN [place])  ETHANOL  BETA-HYDROXYBUTYRIC ACID  URINE DRUG SCREEN, QUALITATIVE ([facility] ONLY)  TROPONIN I (HIGH SENSITIVITY)   CRITICAL CARE Performed by: Ulice DashJohnathan E    Total critical care time: 30 minutes  Critical care time was exclusive of separately billable procedures and treating other patients.  Critical care was necessary to treat or prevent imminent or life-threatening deterioration.  Critical care was time spent personally by me on the following activities: development of treatment plan with patient and/or  surrogate as well as nursing, discussions with consultants, evaluation of patient's response to treatment, examination of patient, obtaining history from patient or surrogate, ordering and performing treatments and interventions, ordering and review of laboratory studies, ordering and review of radiographic studies, pulse oximetry and re-evaluation of patient's condition. t RADIOLOGY Images were viewed by me  Chest x-ray IMPRESSION:  No active disease. _________________________________________   DIFFERENTIAL DIAGNOSIS   DKA, anxiety, substance abuse, alcohol intoxication, withdrawal, dehydration, electrolyte abnormality  FINAL ASSESSMENT AND PLAN  Dyspnea, DKA, acute renal failure, hyperkalemia   Plan: The patient had presented for dyspnea. Patient's labs did indicate severe DKA. Patient's imaging did not reveal any acute process.  He was started on an insulin drip, given fluids, calcium, sodium bicarb.  Patient has severe acidemia, will certainly need intensive care unit care.  He is currently awake and alert remarkably.  I will discuss with nephrology and the hospitalist for admission.   Ulice DashJohnathan E , MD    Note: This note was generated in part or whole with voice recognition software. Voice recognition is usually quite accurate but there are transcription errors that can and very often do occur. I apologize for any typographical errors that were not detected and corrected.     [name],  E, MD 07/27/19 [id number]

## 2019-07-27 NOTE — ED Notes (Signed)
Noel RN, aware of bed assigned  

## 2019-07-27 NOTE — H&P (Signed)
[facility] NAME: Spencer [name]    MR#:  [id number]  DATE OF BIRTH:  02-11-1961  DATE OF ADMISSION:  07/27/2019  PRIMARY CARE PHYSICIAN: Jodi Marble, MD   REQUESTING/REFERRING PHYSICIAN: Ludwig Clarks  CHIEF COMPLAINT:   Chief Complaint  Patient presents with  . Hyperglycemia  . Shortness of Breath    HISTORY OF PRESENT ILLNESS:  Spencer [name]  is a 58 y.o. male with a known history of diabetes mellitus, hypertension, gastroesophageal reflux disease and chronic back pain who presented to the emergency room with complaints of shortness of breath and elevated blood sugars.  Patient reported to be having nausea and vomiting even while in the emergency room.  Patient was evaluated in the emergency room and found to be in DKA with blood sugar of 858 with anion gap of 29.  Patient also with hyperkalemia with potassium of 7.1.  ABG done with pH reported to be 6.9.  Patient with evidence of acute kidney injury with creatinine of 3.30.  Emergency room provider discussed case with nephrologist on-call Dr. Candiss Norse who recommended giving a dose of IV bicarb push and then continue with bicarb drip which has been initiated already.  I discussed recommendation with intensivist on-call Dr. Patsey Berthold who agrees with continuing bicarb drip for now and to monitor patient closely.  Medical service called to admit patient for further evaluation and management.  PAST MEDICAL HISTORY:   Past Medical History:  Diagnosis Date  . Brain aneurysm   . Broken bones    clavical, ankle, arms toes wriast   . Diabetes mellitus without complication ([place])   . Dysrhythmia   . GERD (gastroesophageal reflux disease)   . Hypertension   . Substance abuse ([place])     PAST SURGICAL HISTORY:   Past Surgical History:  Procedure Laterality Date  . ESOPHAGOGASTRODUODENOSCOPY (EGD) WITH PROPOFOL N/A 06/04/2019   Procedure: ESOPHAGOGASTRODUODENOSCOPY (EGD) WITH PROPOFOL;   Surgeon: Toledo, Benay Pike, MD;  Location: [facility] ENDOSCOPY;  Service: Gastroenterology;  Laterality: N/A;  . HERNIA REPAIR    . IMPLANTATION / PLACEMENT OF STRIP ELECTRODES VIA BURR HOLES SUBDURAL     anyrusum  . NECK SURGERY    . TEE WITHOUT CARDIOVERSION N/A 06/03/2019   Procedure: TRANSESOPHAGEAL ECHOCARDIOGRAM (TEE);  Surgeon: Teodoro Spray, MD;  Location: [facility] ORS;  Service: Cardiovascular;  Laterality: N/A;    SOCIAL HISTORY:   Social History   Tobacco Use  . Smoking status: Current Some Day Smoker  . Smokeless tobacco: Never Used  Substance Use Topics  . Alcohol use: Yes    Alcohol/week: 1.0 standard drinks    Types: 1 Cans of beer per week    FAMILY HISTORY:   Family History  Problem Relation Age of Onset  . CAD Sister   . Hypertension Sister   . Healthy Mother   . Healthy Father     DRUG ALLERGIES:  No Known Allergies  REVIEW OF SYSTEMS:   Review of Systems  Constitutional: Negative for chills and fever.  HENT: Negative for hearing loss and tinnitus.   Eyes: Negative for blurred vision and double vision.  Respiratory: Positive for sputum production. Negative for cough.   Cardiovascular: Negative for chest pain and palpitations.  Gastrointestinal: Positive for abdominal pain, nausea and vomiting.  Genitourinary: Negative for dysuria and urgency.  Musculoskeletal: Negative for myalgias and neck pain.  Skin: Negative for itching and rash.  Neurological: Negative for dizziness and headaches.  Psychiatric/Behavioral:  Negative for depression and hallucinations.    MEDICATIONS AT HOME:   Prior to Admission medications   Medication Sig Start Date End Date Taking? Authorizing Provider  blood glucose meter kit and supplies Check blood sugar 4 times daily 01/08/19  Yes Mayo, Pete Pelt, MD  insulin glargine (LANTUS) 100 UNIT/ML injection Inject 40 Units into the skin daily.   Yes [provider]  Insulin Pen Needle 31G X 5 MM MISC Check blood sugars 4  times daily 01/08/19  Yes Mayo, Pete Pelt, MD  metFORMIN (GLUCOPHAGE) 1000 MG tablet Take 1,000 mg by mouth 2 (two) times daily. 06/16/19  Yes [provider]  NOVOLOG FLEXPEN 100 UNIT/ML FlexPen Inject 0-30 Units into the skin 3 (three) times daily. 06/16/19  Yes [provider]  pantoprazole (PROTONIX) 40 MG tablet Take 1 tablet (40 mg total) by mouth daily. 06/05/19 07/27/19 Yes Pyreddy, Reatha Harps, MD      VITAL SIGNS:  Blood pressure 127/85, pulse (!) 107, resp. rate (!) 26, height 6' 1"  (1.854 m), weight 72.6 kg, SpO2 100 %.  PHYSICAL EXAMINATION:  Physical Exam  GENERAL:  58 y.o.-year-old patient lying in the bed with no acute distress.  EYES: Pupils equal, round, reactive to light and accommodation. No scleral icterus. Extraocular muscles intact.  HEENT: Head atraumatic, normocephalic.  Dry oral mucosa.    NECK:  Supple, no jugular venous distention. No thyroid enlargement, no tenderness.  LUNGS: Normal breath sounds bilaterally, no wheezing, rales,rhonchi or crepitation. No use of accessory muscles of respiration.  CARDIOVASCULAR: S1, S2 normal. No murmurs, rubs, or gallops.  ABDOMEN: Soft, nontender, nondistended. Bowel sounds present. No organomegaly or mass.  EXTREMITIES: No pedal edema, cyanosis, or clubbing.  NEUROLOGIC: Cranial nerves II through XII are intact. Muscle strength 5/5 in all extremities. Sensation intact. Gait not checked.  PSYCHIATRIC: The patient is alert and oriented x 3.  SKIN: No obvious rash, lesion, or ulcer.   LABORATORY PANEL:   CBC Recent Labs  Lab 07/27/19 1741  WBC 22.4*  HGB 14.0  HCT 46.3  PLT 322   ------------------------------------------------------------------------------------------------------------------  Chemistries  Recent Labs  Lab 07/27/19 1741  NA 134*  K 7.1*  CL 98  CO2 <7*  GLUCOSE 855*  BUN 52*  CREATININE 3.30*  CALCIUM 9.2    ------------------------------------------------------------------------------------------------------------------  Cardiac Enzymes No results for input(s): TROPONINI in the last 168 hours. ------------------------------------------------------------------------------------------------------------------  RADIOLOGY:  Dg Chest Port 1 View  Result Date: 07/27/2019 CLINICAL DATA:  Dyspnea EXAM: PORTABLE CHEST 1 VIEW COMPARISON:  06/09/2019 FINDINGS: The heart size and mediastinal contours are stable. Both lungs are clear. The visualized skeletal structures are unremarkable. Two metallic BBs again seen projecting over the lower right hemithorax. IMPRESSION: No active disease. Electronically Signed   By: Davina Poke M.D.   On: 07/27/2019 18:07      IMPRESSION AND PLAN:  Patient is a 58 year old with history of diabetes mellitus, hypertension on chronic back pain being admitted for management of acute kidney injury and DKA.  1.  Diabetic ketoacidosis Patient presented with nausea and vomiting and some shortness of breath. Chest x-ray negative.  Requested for KUB. Patient already started on insulin drip.  Has received at least close to 2 L of normal saline in the emergency room. Due to severe metabolic acidosis with pH of 6.9 and acute kidney injury, emergency room provider discussed case with nephrologist on-call Dr. Candiss Norse who recommended initiating bicarb drip.  I discussed with intensivist on-call Dr. Patsey Berthold also agrees with continuation of  bicarb drip for now and to monitor patient closely. Monitor serial BMP every 4 hours and plan to wean off insulin drip once anion gap is corrected. Hold home dose of metformin and Lantus insulin for now.  Patient claims to be compliant with meds  2.  Acute gastritis Likely due to DKA. Patient presented with nausea and vomiting.  No significant abdominal pains. Requested for KUB.  3.  Leukocytosis with white count of 22,000 Most likely reactive.   So far no evidence of infectious process. If patient spikes fever to initiate antibiotics and request for blood cultures.  4.  Acute kidney injury with metabolic acidosis with pH of 6.9 Case was discussed with nephrologist by ED physician. Bicarb drip already initiated.  Monitor renal function closely.  5.  Hyperkalemia with potassium of 7.1 Expect shift of potassium intracellularly with ongoing insulin drip.  Patient also given 1 amp of bicarb already.  Started on bicarb drip. Repeating BMP now to recheck potassium level.  6.  Chronic low back pain Pain control as needed  DVT prophylaxis; heparin  All the records are reviewed and case discussed with ED provider. Management plans discussed with the patient, family and and he is in agreement.  CODE STATUS: Full code  TOTAL TIME TAKING CARE OF THIS PATIENT: 63 minutes.    Danalee Flath M.D on 07/27/2019 at 7:27 PM  Between 7am to 6pm - Pager - [phone]  After 6pm go to [email or web address] - Proofreader  [facility]  Office  [phone]  CC: Primary care physician; Jodi Marble, MD   Note: This dictation was prepared with Dragon dictation along with smaller phrase technology. Any transcriptional errors that result from this process are unintentional.

## 2019-07-27 NOTE — ED Notes (Addendum)
Pt updated for room assignment   First call: Per Erasmo Downer, ICU, "the charge nurse hasn't approved that bed and they're not ready yet - just give them a minute",   This RN notified no problem and will re-attempt in a few minutes

## 2019-07-27 NOTE — ED Notes (Signed)
Admitting MD at bedside at this time.

## 2019-07-28 DIAGNOSIS — E875 Hyperkalemia: Secondary | ICD-10-CM

## 2019-07-28 DIAGNOSIS — N179 Acute kidney failure, unspecified: Secondary | ICD-10-CM

## 2019-07-28 LAB — CBC
HCT: 31.3 % — ABNORMAL LOW (ref 39.0–52.0)
Hemoglobin: 11.1 g/dL — ABNORMAL LOW (ref 13.0–17.0)
MCH: 32.3 pg (ref 26.0–34.0)
MCHC: 35.5 g/dL (ref 30.0–36.0)
MCV: 91 fL (ref 80.0–100.0)
Platelets: 242 10*3/uL (ref 150–400)
RBC: 3.44 MIL/uL — ABNORMAL LOW (ref 4.22–5.81)
RDW: 13.2 % (ref 11.5–15.5)
WBC: 12.6 10*3/uL — ABNORMAL HIGH (ref 4.0–10.5)
nRBC: 0 % (ref 0.0–0.2)

## 2019-07-28 LAB — BASIC METABOLIC PANEL
Anion gap: 16 — ABNORMAL HIGH (ref 5–15)
Anion gap: 9 (ref 5–15)
BUN: 48 mg/dL — ABNORMAL HIGH (ref 6–20)
BUN: 41 mg/dL — ABNORMAL HIGH (ref 6–20)
CO2: 16 mmol/L — ABNORMAL LOW (ref 22–32)
CO2: 25 mmol/L (ref 22–32)
Calcium: 8.8 mg/dL — ABNORMAL LOW (ref 8.9–10.3)
Calcium: 8.7 mg/dL — ABNORMAL LOW (ref 8.9–10.3)
Chloride: 111 mmol/L (ref 98–111)
Chloride: 109 mmol/L (ref 98–111)
Creatinine, Ser: 2.52 mg/dL — ABNORMAL HIGH (ref 0.61–1.24)
Creatinine, Ser: 1.55 mg/dL — ABNORMAL HIGH (ref 0.61–1.24)
GFR calc Af Amer: 31 mL/min — ABNORMAL LOW (ref >60)
GFR calc Af Amer: 56 mL/min — ABNORMAL LOW (ref >60)
GFR calc non Af Amer: 27 mL/min — ABNORMAL LOW (ref >60)
GFR calc non Af Amer: 48 mL/min — ABNORMAL LOW (ref >60)
Glucose, Bld: 321 mg/dL — ABNORMAL HIGH (ref 70–99)
Glucose, Bld: 109 mg/dL — ABNORMAL HIGH (ref 70–99)
Potassium: 3.9 mmol/L (ref 3.5–5.1)
Potassium: 3.5 mmol/L (ref 3.5–5.1)
Sodium: 143 mmol/L (ref 135–145)
Sodium: 143 mmol/L (ref 135–145)

## 2019-07-28 LAB — GLUCOSE, CAPILLARY
Glucose-Capillary: 109 mg/dL — ABNORMAL HIGH (ref 70–99)
Glucose-Capillary: 114 mg/dL — ABNORMAL HIGH (ref 70–99)
Glucose-Capillary: 125 mg/dL — ABNORMAL HIGH (ref 70–99)
Glucose-Capillary: 126 mg/dL — ABNORMAL HIGH (ref 70–99)
Glucose-Capillary: 134 mg/dL — ABNORMAL HIGH (ref 70–99)
Glucose-Capillary: 146 mg/dL — ABNORMAL HIGH (ref 70–99)
Glucose-Capillary: 209 mg/dL — ABNORMAL HIGH (ref 70–99)
Glucose-Capillary: 214 mg/dL — ABNORMAL HIGH (ref 70–99)
Glucose-Capillary: 262 mg/dL — ABNORMAL HIGH (ref 70–99)
Glucose-Capillary: 277 mg/dL — ABNORMAL HIGH (ref 70–99)
Glucose-Capillary: 332 mg/dL — ABNORMAL HIGH (ref 70–99)
Glucose-Capillary: 383 mg/dL — ABNORMAL HIGH (ref 70–99)
Glucose-Capillary: 463 mg/dL — ABNORMAL HIGH (ref 70–99)

## 2019-07-28 LAB — MAGNESIUM
Magnesium: 2.4 mg/dL (ref 1.7–2.4)
Magnesium: 2.1 mg/dL (ref 1.7–2.4)

## 2019-07-28 LAB — PHOSPHORUS
Phosphorus: 3.4 mg/dL (ref 2.5–4.6)
Phosphorus: 2.4 mg/dL — ABNORMAL LOW (ref 2.5–4.6)

## 2019-07-28 MED ORDER — SODIUM CHLORIDE 0.9% FLUSH
3.0000 mL | Freq: Two times a day (BID) | INTRAVENOUS | Status: DC
  Administered 2019-07-28 – 2019-07-29 (×3): 3 mL via INTRAVENOUS

## 2019-07-28 MED ORDER — INSULIN REGULAR HUMAN 100 UNIT/ML IJ SOLN
10.0000 [IU] | Freq: Once | INTRAMUSCULAR | Status: AC
  Administered 2019-07-28: 10 [IU] via INTRAVENOUS
  Filled 2019-07-28: qty 10

## 2019-07-28 MED ORDER — INSULIN GLARGINE 100 UNIT/ML ~~LOC~~ SOLN
15.0000 [IU] | Freq: Every day | SUBCUTANEOUS | Status: DC
  Filled 2019-07-28: qty 0.15

## 2019-07-28 MED ORDER — OXYCODONE HCL 5 MG PO TABS
5.0000 mg | ORAL_TABLET | Freq: Four times a day (QID) | ORAL | Status: DC | PRN
  Administered 2019-07-28 – 2019-07-29 (×3): 5 mg via ORAL
  Filled 2019-07-28 (×4): qty 1

## 2019-07-28 MED ORDER — INSULIN ASPART 100 UNIT/ML ~~LOC~~ SOLN
5.0000 [IU] | Freq: Three times a day (TID) | SUBCUTANEOUS | Status: DC
  Administered 2019-07-28 – 2019-07-29 (×4): 5 [IU] via SUBCUTANEOUS
  Filled 2019-07-28 (×4): qty 1

## 2019-07-28 MED ORDER — INSULIN GLARGINE 100 UNIT/ML ~~LOC~~ SOLN
15.0000 [IU] | Freq: Every day | SUBCUTANEOUS | Status: DC
  Administered 2019-07-28: 15 [IU] via SUBCUTANEOUS
  Filled 2019-07-28 (×2): qty 0.15

## 2019-07-28 MED ORDER — ENSURE MAX PROTEIN PO LIQD
11.0000 [oz_av] | Freq: Two times a day (BID) | ORAL | Status: DC
  Administered 2019-07-29 (×2): 11 [oz_av] via ORAL
  Filled 2019-07-28: qty 330

## 2019-07-28 MED ORDER — INSULIN ASPART 100 UNIT/ML ~~LOC~~ SOLN
0.0000 [IU] | SUBCUTANEOUS | Status: DC
  Administered 2019-07-28: 2 [IU] via SUBCUTANEOUS
  Filled 2019-07-28: qty 1

## 2019-07-28 MED ORDER — INSULIN REGULAR HUMAN 100 UNIT/ML IJ SOLN
15.0000 [IU] | Freq: Once | INTRAMUSCULAR | Status: AC
  Administered 2019-07-29: 15 [IU] via INTRAVENOUS
  Filled 2019-07-28: qty 10

## 2019-07-28 MED ORDER — ACETAMINOPHEN 325 MG PO TABS: 650.0000 mg | ORAL_TABLET | Freq: Four times a day (QID) | ORAL | Status: DC | PRN

## 2019-07-28 MED ORDER — INFLUENZA VAC SPLIT QUAD 0.5 ML IM SUSY
0.5000 mL | PREFILLED_SYRINGE | INTRAMUSCULAR | Status: AC
  Administered 2019-07-29: 0.5 mL via INTRAMUSCULAR
  Filled 2019-07-28: qty 0.5

## 2019-07-28 MED ORDER — INSULIN ASPART 100 UNIT/ML ~~LOC~~ SOLN: 0.0000 [IU] | Freq: Every day | SUBCUTANEOUS | Status: DC

## 2019-07-28 MED ORDER — INSULIN ASPART 100 UNIT/ML ~~LOC~~ SOLN
0.0000 [IU] | Freq: Three times a day (TID) | SUBCUTANEOUS | Status: DC
  Administered 2019-07-29: 5 [IU] via SUBCUTANEOUS
  Administered 2019-07-29: 2 [IU] via SUBCUTANEOUS
  Filled 2019-07-28 (×2): qty 1

## 2019-07-28 MED ORDER — INSULIN ASPART 100 UNIT/ML ~~LOC~~ SOLN
0.0000 [IU] | Freq: Three times a day (TID) | SUBCUTANEOUS | Status: DC
  Administered 2019-07-28: 7 [IU] via SUBCUTANEOUS
  Administered 2019-07-28: 5 [IU] via SUBCUTANEOUS
  Filled 2019-07-28 (×2): qty 1

## 2019-07-28 NOTE — Progress Notes (Signed)
[facility] - [place] [facility]   PATIENT NAME: Spencer [name]    MR#:  [id number]  DATE OF BIRTH:  03/23/1961  SUBJECTIVE:   Came in with nausea and vomiting and found to be in DKA. Now off insulin gtt On CIWA--received ativan in am--sedated REVIEW OF SYSTEMS:   Review of Systems  Unable to perform ROS: Mental status change   Tolerating Diet: Tolerating PT:   DRUG ALLERGIES:  No Known Allergies  VITALS:  Blood pressure 122/65, pulse 77, temperature 98.5 F (36.9 C), temperature source Oral, resp. rate 20, height 6' (1.829 m), weight 61.7 kg, SpO2 98 %.  PHYSICAL EXAMINATION:   Physical Exam  GENERAL:  58 y.o.-year-old patient lying in the bed with no acute distress.  EYES: Pupils equal, round, reactive to light and accommodation. No scleral icterus. Extraocular muscles intact.  HEENT: Head atraumatic, normocephalic. Oropharynx and nasopharynx clear.  NECK:  Supple, no jugular venous distention. No thyroid enlargement, no tenderness.  LUNGS: Normal breath sounds bilaterally, no wheezing, rales, rhonchi. No use of accessory muscles of respiration.  CARDIOVASCULAR: S1, S2 normal. No murmurs, rubs, or gallops.  ABDOMEN: Soft, nontender, nondistended. Bowel sounds present. No organomegaly or mass.  EXTREMITIES: No cyanosis, clubbing or edema b/l.    NEUROLOGIC: sedated but moves extremites well PSYCHIATRIC:  Sedated form ativan in am SKIN: No obvious rash, lesion, or ulcer.   LABORATORY PANEL:  CBC Recent Labs  Lab 07/28/19 0437  WBC 12.6*  HGB 11.1*  HCT 31.3*  PLT 242    Chemistries  Recent Labs  Lab 07/28/19 0623  NA 143  K 3.5  CL 109  CO2 25  GLUCOSE 109*  BUN 41*  CREATININE 1.55*  CALCIUM 8.7*  MG 2.1   Cardiac Enzymes No results for input(s): TROPONINI in the last 168 hours. RADIOLOGY:  Dg Abd 1 View  Result Date: 07/27/2019 CLINICAL DATA:  Abdomen pain, low back pain after fall EXAM: ABDOMEN - 1 VIEW COMPARISON:   11/05/2016 FINDINGS: Nonobstructed bowel gas pattern with large amount of stool in the colon. Possible acute right tenth nondisplaced rib fracture. Phleboliths in the pelvis. IMPRESSION: 1. Nonobstructed gas pattern with large amount of stool in the colon 2. Possible nondisplaced right tenth rib fracture Electronically Signed   By: Jasmine PangKim  Fujinaga M.D.   On: 07/27/2019 20:29   Dg Chest Port 1 View  Result Date: 07/27/2019 CLINICAL DATA:  Dyspnea EXAM: PORTABLE CHEST 1 VIEW COMPARISON:  06/09/2019 FINDINGS: The heart size and mediastinal contours are stable. Both lungs are clear. The visualized skeletal structures are unremarkable. Two metallic BBs again seen projecting over the lower right hemithorax. IMPRESSION: No active disease. Electronically Signed   By: Duanne GuessNicholas  Plundo M.D.   On: 07/27/2019 18:07   ASSESSMENT AND PLAN:   58 year old with history of diabetes mellitus, hypertension on chronic back pain being admitted for management of acute kidney injury and DKA.  1.  Diabetic ketoacidosis with type 2 DM -Patient presented with nausea and vomiting and some shortness of breath. -Chest x-ray negative.   -now off IV insulin gtt -on lantus and novolog -appreciate DM coordinator input   2.  Acute gastritis Likely due to DKA. Patient presented with nausea and vomiting.  No significant abdominal pains.  3.  Leukocytosis with white count of 22,000 Most likely reactive.  So far no evidence of infectious process. If patient spikes fever to initiate antibiotics and request for blood cultures.  4.  Acute kidney injury with  metabolic acidosis with pH of 6.9 Case was discussed with nephrologist by ED physician. recived IV Bicarb drip  -creat 2.88--1.55 -seen by Dr Holley Raring  5.  Hyperkalemia with potassium of 7.1 Expect shift of potassium intracellularly with ongoing insulin drip.  Patient also given 1 amp of bicarb already. was on bicarb drip. - K down to 3.5  6.  Chronic low back  pain Pain control as needed  7.DVT prophylaxis; heparin   CODE STATUS: full  DVT Prophylaxis: heparin  TOTAL TIME TAKING CARE OF THIS PATIENT: *30* minutes.  >50% time spent on counselling and coordination of care  POSSIBLE D/C IN *1-2* DAYS, DEPENDING ON CLINICAL CONDITION.  Note: This dictation was prepared with Dragon dictation along with smaller phrase technology. Any transcriptional errors that result from this process are unintentional.  Fritzi Mandes M.D on 07/28/2019 at 3:37 PM  Between 7am to 6pm - Pager - [phone]  After 6pm go to [email or web address] - password [email or web address] [facility]  Office  [phone]  CC: Primary care physician; Jodi Marble, MDPatient ID: Spencer [name], male   DOB: 11-20-1960, 58 y.o.   MRN: [id number]

## 2019-07-28 NOTE — Progress Notes (Signed)
Inpatient Diabetes Program Recommendations  AACE/ADA: New Consensus Statement on Inpatient Glycemic Control (2015)  Target Ranges:  Prepandial:   less than 140 mg/dL      Peak postprandial:   less than 180 mg/dL (1-2 hours)      Critically ill patients:  140 - 180 mg/dL   Lab Results  Component Value Date   GLUCAP 209 (H) 07/28/2019   HGBA1C 13.8 (H) 05/30/2019    Review of Glycemic Control  Diabetes history: DM2 Outpatient Diabetes medications: Lantus 40 units daily + Novolog 0-30 units tid meal coverage + Metformin 1 gm bid Current orders for Inpatient glycemic control: Lantus 15 units qd + Novolog moderate correction q 4 hrs.  Inpatient Diabetes Program Recommendations:   Noted patient is now eating. -Add Novolog 5 units tid meal coverage if eats 50% -Change Novolog correction to sensitive tid + hs 0-5 units -Adjust basal Lantus as needed  Noted patient changed Lantus from lantus 40 bid to daily on last discharge. Will plan to speak with patient.  Thank you, Nani Gasser. Chrisanna Mishra, RN, MSN, CDE  Diabetes Coordinator Inpatient Glycemic Control Team Team Pager [phone] (8am-5pm) 07/28/2019 10:47 AM

## 2019-07-28 NOTE — Progress Notes (Signed)
0700 am Abg  canceled  by ,Arvil Chaco NP .

## 2019-07-28 NOTE — Progress Notes (Signed)
[facility]  ROUNDING NOTE   Subjective:  Patient seen at bedside. Well-known to [place] from prior admission as well as recent office visit. He has recent acute renal failure secondary to DKA. He was admitted now with hypoglycemia and shortness of breath. Upon admission creatinine was 2.52. Creatinine now down to 1.55.  Objective:  Vital signs in last 24 hours:  Temp:  [97.5 F (36.4 C)-98.9 F (37.2 C)] 98.9 F (37.2 C) (09/21 0800) Pulse Rate:  [78-112] 87 (09/21 1000) Resp:  [15-33] 28 (09/21 1000) BP: (111-173)/(66-107) 111/66 (09/21 1000) SpO2:  [94 %-100 %] 97 % (09/21 1000) Weight:  [61.7 kg-72.6 kg] 61.7 kg (09/20 2038)  Weight change:  Filed Weights   07/27/19 1736 07/27/19 2038  Weight: 72.6 kg 61.7 kg    Intake/Output: I/O last 3 completed shifts: In: 1942.5 [I.V.:1893.6; IV Piggyback:48.9] Out: 850 [Urine:850]   Intake/Output this shift:  Total I/O In: 3017.9 [P.O.:480; I.V.:2537.9] Out: -   Physical Exam: General: No acute distress  Head: Normocephalic, atraumatic. Moist oral mucosal membranes  Eyes: Anicteric  Neck: Supple, trachea midline  Lungs:  Clear to auscultation, normal effort  Heart: S1S2 no rubs  Abdomen:  Soft, nontender, bowel sounds present  Extremities: No peripheral edema.  Neurologic: Awake, alert, following commands  Skin: No lesions       Basic Metabolic Panel: Recent Labs  Lab 07/27/19 1741 07/27/19 2326 07/28/19 0623  NA 134* 143 143  K 7.1* 3.9 3.5  CL 98 111 109  CO2 <7* 16* 25  GLUCOSE 855* 321* 109*  BUN 52* 48* 41*  CREATININE 3.30* 2.52* 1.55*  CALCIUM 9.2 8.8* 8.7*  MG  --  2.4 2.1  PHOS  --  3.4 2.4*    Liver Function Tests: No results for input(s): AST, ALT, ALKPHOS, BILITOT, PROT, ALBUMIN in the last 168 hours. No results for input(s): LIPASE, AMYLASE in the last 168 hours. No results for input(s): AMMONIA in the last 168 hours.  CBC: Recent Labs  Lab 07/27/19 1741 07/28/19 0437  WBC  22.4* 12.6*  NEUTROABS 19.9*  --   HGB 14.0 11.1*  HCT 46.3 31.3*  MCV 107.2* 91.0  PLT 322 242    Cardiac Enzymes: No results for input(s): CKTOTAL, CKMB, CKMBINDEX, TROPONINI in the last 168 hours.  BNP: Invalid input(s): POCBNP  CBG: Recent Labs  Lab 07/28/19 0402 07/28/19 0459 07/28/19 0604 07/28/19 0723 07/28/19 0827  GLUCAP 114* 125* 109* 126* 209*    Microbiology: Results for orders placed or performed during the hospital encounter of 07/27/19  SARS Coronavirus 2 [facility] order, Performed in [facility] hospital lab) Nasopharyngeal Nasopharyngeal Swab     Status: None   Collection Time: 07/27/19  6:28 PM   Specimen: Nasopharyngeal Swab  Result Value Ref Range Status   SARS Coronavirus 2 NEGATIVE NEGATIVE Final    Comment: (NOTE) If result is NEGATIVE SARS-CoV-2 target nucleic acids are NOT DETECTED. The SARS-CoV-2 RNA is generally detectable in upper and lower  respiratory specimens during the acute phase of infection. The lowest  concentration of SARS-CoV-2 viral copies this assay can detect is 250  copies / mL. A negative result does not preclude SARS-CoV-2 infection  and should not be used as the sole basis for treatment or other  patient management decisions.  A negative result may occur with  improper specimen collection / handling, submission of specimen other  than nasopharyngeal swab, presence of viral mutation(s) within the  areas targeted by this assay, and  inadequate number of viral copies  (<250 copies / mL). A negative result must be combined with clinical  observations, patient history, and epidemiological information. If result is POSITIVE SARS-CoV-2 target nucleic acids are DETECTED. The SARS-CoV-2 RNA is generally detectable in upper and lower  respiratory specimens dur ing the acute phase of infection.  Positive  results are indicative of active infection with SARS-CoV-2.  Clinical  correlation with patient history and other diagnostic  information is  necessary to determine patient infection status.  Positive results do  not rule out bacterial infection or co-infection with other viruses. If result is PRESUMPTIVE POSTIVE SARS-CoV-2 nucleic acids MAY BE PRESENT.   A presumptive positive result was obtained on the submitted specimen  and confirmed on repeat testing.  While 2019 novel coronavirus  (SARS-CoV-2) nucleic acids may be present in the submitted sample  additional confirmatory testing may be necessary for epidemiological  and / or clinical management purposes  to differentiate between  SARS-CoV-2 and other Sarbecovirus currently known to infect humans.  If clinically indicated additional testing with an alternate test  methodology [phone]) is advised. The SARS-CoV-2 RNA is generally  detectable in upper and lower respiratory sp ecimens during the acute  phase of infection. The expected result is Negative. Fact Sheet for Patients:  StrictlyIdeas.no Fact Sheet for Healthcare Providers: BankingDealers.co.za This test is not yet approved or cleared by the [place] FDA and has been authorized for detection and/or diagnosis of SARS-CoV-2 by FDA under an Emergency Use Authorization (EUA).  This EUA will remain in effect (meaning this test can be used) for the duration of the COVID-19 declaration under Section 564(b)(1) of the Act, 21 U.S.C. section 360bbb-3(b)(1), unless the authorization is terminated or revoked sooner. Performed at [facility], [address] [place], [place] [address]   MRSA PCR Screening     Status: None   Collection Time: 07/27/19  8:43 PM   Specimen: Nasal Mucosa; Nasopharyngeal  Result Value Ref Range Status   MRSA by PCR NEGATIVE NEGATIVE Final    Comment:        The GeneXpert MRSA Assay (FDA approved for NASAL specimens only), is one component of a comprehensive MRSA colonization surveillance program. It is  not intended to diagnose MRSA infection nor to guide or monitor treatment for MRSA infections. Performed at [address] [facility], [address] [place], [place] [address]     Coagulation Studies: No results for input(s): LABPROT, INR in the last 72 hours.  Urinalysis: No results for input(s): COLORURINE, LABSPEC, PHURINE, GLUCOSEU, HGBUR, BILIRUBINUR, KETONESUR, PROTEINUR, UROBILINOGEN, NITRITE, LEUKOCYTESUR in the last 72 hours.  Invalid input(s): APPERANCEUR    Imaging: Dg Abd 1 View  Result Date: 07/27/2019 CLINICAL DATA:  Abdomen pain, low back pain after fall EXAM: ABDOMEN - 1 VIEW COMPARISON:  11/05/2016 FINDINGS: Nonobstructed bowel gas pattern with large amount of stool in the colon. Possible acute right tenth nondisplaced rib fracture. Phleboliths in the pelvis. IMPRESSION: 1. Nonobstructed gas pattern with large amount of stool in the colon 2. Possible nondisplaced right tenth rib fracture Electronically Signed   By: Donavan Foil M.D.   On: 07/27/2019 20:29   Dg Chest Port 1 View  Result Date: 07/27/2019 CLINICAL DATA:  Dyspnea EXAM: PORTABLE CHEST 1 VIEW COMPARISON:  06/09/2019 FINDINGS: The heart size and mediastinal contours are stable. Both lungs are clear. The visualized skeletal structures are unremarkable. Two metallic BBs again seen projecting over the lower right hemithorax. IMPRESSION: No active disease. Electronically Signed  By: Duanne Guess M.D.   On: 07/27/2019 18:07     Medications:   . dextrose 5 % and 0.45% NaCl 125 mL/hr at 07/28/19 0820  . insulin Stopped (07/28/19 9371)   . Chlorhexidine Gluconate Cloth  6 each Topical Daily  . folic acid  1 mg Oral Daily  . heparin  5,000 Units Subcutaneous Q8H  . [START ON 07/29/2019] influenza vac split quadrivalent PF  0.5 mL Intramuscular Tomorrow-1000  . insulin aspart  0-15 Units Subcutaneous Q4H  . insulin glargine  15 Units Subcutaneous QHS  . multivitamin with minerals  1 tablet Oral Daily  .  thiamine  100 mg Oral Daily   Or  . thiamine  100 mg Intravenous Daily   LORazepam **OR** LORazepam  Assessment/ Plan:  58 y.o. male withdiabetes mellitus type 2, recent acute renal failure, admitted now with acute renal failure, hyperglycemia, metabolic acidosis.   1.  Acute renal failure.  BUN was 48 upon admission with a creatinine of 2.52.  Creatinine now down to 1.5.  Continue IV fluid hydration for now.  No indication for dialysis.  2.  Metabolic acidosis.  Serum bicarbonate was 16 upon admission.  Now up to 25.  No further need for IV bicarbonate administration.  3.  Anemia unspecified.  Hemoglobin currently 11.1.  No indication for Procrit.  Continue to monitor CBC.   LOS: 1 Nohelia Valenza 9/21/202011:02 AM

## 2019-07-28 NOTE — Progress Notes (Signed)
CRITICAL CARE PROGRESS NOTE    Name: NUR KRALIK MRN: [id number] DOB: 01-24-1961     LOS: 1   SUBJECTIVE FINDINGS & SIGNIFICANT EVENTS   Patient description:   This is a 58 year old male with a history of type 2 diabetes, multiple hospitalizations for DKA, and hypoglycemia who presented to the ED with complaints of hyperglycemia, nausea, vomiting, altered mental status and dyspnea.  His ED work-up revealed a blood glucose level of 855 mg/dL, potassium of 7.1 and a creatinine of 3.3.  His VBG showed a pH of 6.9.  He is being admitted to the ICU for further management   Lines / Drains: PIV  Cultures / Sepsis markers: ux bx  Antibiotics: empiric   Protocols / Consultants: DKA   Tests / Events: CBGs  Overnight: DKA protocol Phase 1-3   PAST MEDICAL HISTORY   Past Medical History:  Diagnosis Date  . Brain aneurysm   . Broken bones    clavical, ankle, arms toes wriast   . Diabetes mellitus without complication (HCC)   . Dysrhythmia   . GERD (gastroesophageal reflux disease)   . Hypertension   . Substance abuse (HCC)      SURGICAL HISTORY   Past Surgical History:  Procedure Laterality Date  . ESOPHAGOGASTRODUODENOSCOPY (EGD) WITH PROPOFOL N/A 06/04/2019   Procedure: ESOPHAGOGASTRODUODENOSCOPY (EGD) WITH PROPOFOL;  Surgeon: Toledo, Boykin Nearing, MD;  Location: [facility] ENDOSCOPY;  Service: Gastroenterology;  Laterality: N/A;  . HERNIA REPAIR    . IMPLANTATION / PLACEMENT OF STRIP ELECTRODES VIA BURR HOLES SUBDURAL     anyrusum  . NECK SURGERY    . TEE WITHOUT CARDIOVERSION N/A 06/03/2019   Procedure: TRANSESOPHAGEAL ECHOCARDIOGRAM (TEE);  Surgeon: Dalia Heading, MD;  Location: [facility] ORS;  Service: Cardiovascular;  Laterality: N/A;     FAMILY HISTORY   Family History  Problem Relation Age of  Onset  . CAD Sister   . Hypertension Sister   . Healthy Mother   . Healthy Father      SOCIAL HISTORY   Social History   Tobacco Use  . Smoking status: Current Some Day Smoker  . Smokeless tobacco: Never Used  Substance Use Topics  . Alcohol use: Yes    Alcohol/week: 1.0 standard drinks    Types: 1 Cans of beer per week  . Drug use: Not Currently    Types: Cocaine     MEDICATIONS   Current Medication:  Current Facility-Administered Medications:  .  Chlorhexidine Gluconate Cloth 2 % PADS 6 each, 6 each, Topical, Daily, Tukov-Yual, Magdalene S, NP, 6 each at 07/27/19 2042 .  dextrose 5 %-0.45 % sodium chloride infusion, , Intravenous, Continuous, Ojie, Jude, MD, Last Rate: 125 mL/hr at 07/28/19 0208 .  folic acid (FOLVITE) tablet 1 mg, 1 mg, Oral, Daily, Tukov-Yual, Magdalene S, NP, 1 mg at 07/27/19 2312 .  heparin injection 5,000 Units, 5,000 Units, Subcutaneous, Q8H, Ojie, Jude, MD, 5,000 Units at 07/28/19 0507 .  [START ON 07/29/2019] influenza vac split quadrivalent PF (FLUARIX) injection 0.5 mL, 0.5 mL, Intramuscular, Tomorrow-1000, Patel, Sona, MD .  insulin aspart (novoLOG) injection 0-15 Units, 0-15 Units, Subcutaneous, Q4H, Tukov-Yual, Magdalene S, NP .  insulin glargine (LANTUS) injection 15 Units, 15 Units, Subcutaneous, QHS, Tukov-Yual, Magdalene S, NP .  insulin regular, human (MYXREDLIN) 100 units/ 100 mL infusion, , Intravenous, Continuous, [name], MD, Stopped at 07/28/19 [phone] .  lactated ringers infusion, , Intravenous, Continuous, Tukov-Yual, Magdalene S, NP, Stopped at 07/28/19 [phone] .  LORazepam (  ATIVAN) tablet 1-4 mg, 1-4 mg, Oral, Q1H PRN **OR** LORazepam (ATIVAN) injection 1-4 mg, 1-4 mg, Intravenous, Q1H PRN, Tukov-Yual, Magdalene S, NP .  multivitamin with minerals tablet 1 tablet, 1 tablet, Oral, Daily, Tukov-Yual, Magdalene S, NP, 1 tablet at 07/27/19 2312 .  sodium bicarbonate 150 mEq in sterile water 1,000 mL infusion, , Intravenous,  Continuous, Tukov-Yual, Magdalene S, NP, Last Rate: 150 mL/hr at 07/28/19 0405 .  thiamine (VITAMIN B-1) tablet 100 mg, 100 mg, Oral, Daily, 100 mg at 07/27/19 2312 **OR** thiamine (B-1) injection 100 mg, 100 mg, Intravenous, Daily, Tukov-Yual, Magdalene S, NP    ALLERGIES   Patient has no known allergies.    REVIEW OF SYSTEMS     10 Point ROS neg except as per hpi  PHYSICAL EXAMINATION   Vital Signs: Temp:  [97.5 F (36.4 C)-98.1 F (36.7 C)] 98.1 F (36.7 C) (09/21 0400) Pulse Rate:  [78-112] 82 (09/21 0600) Resp:  [15-33] 25 (09/21 0600) BP: (117-173)/(73-107) 138/78 (09/21 0600) SpO2:  [94 %-100 %] 98 % (09/21 0600) Weight:  [61.7 kg-72.6 kg] 61.7 kg (09/20 2038)  GENERAL:nad HEAD: Normocephalic, atraumatic.  EYES: Pupils equal, round, reactive to light.  No scleral icterus.  MOUTH: Moist mucosal membrane. NECK: Supple. No thyromegaly. No nodules. No JVD.  PULMONARY: ctab CARDIOVASCULAR: S1 and S2. Regular rate and rhythm. No murmurs, rubs, or gallops.  GASTROINTESTINAL: Soft, nontender, non-distended. No masses. Positive bowel sounds. No hepatosplenomegaly.  MUSCULOSKELETAL: No swelling, clubbing, or edema.  NEUROLOGIC: Mild distress due to acute illness SKIN:intact,warm,dry   PERTINENT DATA     Infusions: . dextrose 5 % and 0.45% NaCl 125 mL/hr at 07/28/19 0208  . insulin Stopped (07/28/19 3664)  . lactated ringers Stopped (07/28/19 4034)  .  sodium bicarbonate (isotonic) infusion in sterile water 150 mL/hr at 07/28/19 0405   Scheduled Medications: . Chlorhexidine Gluconate Cloth  6 each Topical Daily  . folic acid  1 mg Oral Daily  . heparin  5,000 Units Subcutaneous Q8H  . [START ON 07/29/2019] influenza vac split quadrivalent PF  0.5 mL Intramuscular Tomorrow-1000  . insulin aspart  0-15 Units Subcutaneous Q4H  . insulin glargine  15 Units Subcutaneous QHS  . multivitamin with minerals  1 tablet Oral Daily  . thiamine  100 mg Oral Daily   Or  .  thiamine  100 mg Intravenous Daily   PRN Medications: LORazepam **OR** LORazepam Hemodynamic parameters:   Intake/Output: 09/20 0701 - 09/21 0700 In: 1942.5 [I.V.:1893.6; IV Piggyback:48.9] Out: 850 [Urine:850]  Ventilator  Settings:     Other Labs:     LAB RESULTS:  Basic Metabolic Panel: Recent Labs  Lab 07/27/19 1741 07/27/19 2326 07/28/19 0623  NA 134* 143 143  K 7.1* 3.9 3.5  CL 98 111 109  CO2 <7* 16* 25  GLUCOSE 855* 321* 109*  BUN 52* 48* 41*  CREATININE 3.30* 2.52* 1.55*  CALCIUM 9.2 8.8* 8.7*  MG  --  2.4 2.1  PHOS  --  3.4 2.4*   Liver Function Tests: No results for input(s): AST, ALT, ALKPHOS, BILITOT, PROT, ALBUMIN in the last 168 hours. No results for input(s): LIPASE, AMYLASE in the last 168 hours. No results for input(s): AMMONIA in the last 168 hours. CBC: Recent Labs  Lab 07/27/19 1741 07/28/19 0437  WBC 22.4* 12.6*  NEUTROABS 19.9*  --   HGB 14.0 11.1*  HCT 46.3 31.3*  MCV 107.2* 91.0  PLT 322 242   Cardiac Enzymes: No results for input(s): CKTOTAL,  CKMB, CKMBINDEX, TROPONINI in the last 168 hours. BNP: Invalid input(s): POCBNP CBG: Recent Labs  Lab 07/28/19 0204 07/28/19 0305 07/28/19 0402 07/28/19 0459 07/28/19 0604  GLUCAP 146* 134* 114* 125* 109*     IMAGING RESULTS:  Imaging: Dg Abd 1 View  Result Date: 07/27/2019 CLINICAL DATA:  Abdomen pain, low back pain after fall EXAM: ABDOMEN - 1 VIEW COMPARISON:  11/05/2016 FINDINGS: Nonobstructed bowel gas pattern with large amount of stool in the colon. Possible acute right tenth nondisplaced rib fracture. Phleboliths in the pelvis. IMPRESSION: 1. Nonobstructed gas pattern with large amount of stool in the colon 2. Possible nondisplaced right tenth rib fracture Electronically Signed   By: Jasmine PangKim  Fujinaga M.D.   On: 07/27/2019 20:29   Dg Chest Port 1 View  Result Date: 07/27/2019 CLINICAL DATA:  Dyspnea EXAM: PORTABLE CHEST 1 VIEW COMPARISON:  06/09/2019 FINDINGS: The heart  size and mediastinal contours are stable. Both lungs are clear. The visualized skeletal structures are unremarkable. Two metallic BBs again seen projecting over the lower right hemithorax. IMPRESSION: No active disease. Electronically Signed   By: Duanne GuessNicholas  Plundo M.D.   On: 07/27/2019 18:07    ASSESSMENT AND PLAN    -Multidisciplinary rounds held today   DKA  - continue DKA protocol phase 1-3 -d/c HCO3- -start weight based long and short acting insulin regimen -oxygen as needed ICU monitoring   GI/Nutrition GI PROPHYLAXIS as indicated DIET-->TF's as tolerated Constipation protocol as indicated  ENDO - ICU hypoglycemic\Hyperglycemia protocol -check FSBS per protocol   ELECTROLYTES -follow labs as needed -replace as needed -pharmacy consultation   DVT/GI PRX ordered -SCDs  TRANSFUSIONS AS NEEDED MONITOR FSBS ASSESS the need for LABS as needed   Critical care provider statement:    Critical care time (minutes):  32   Critical care time was exclusive of:  Separately billable procedures and treating other patients   Critical care was necessary to treat or prevent imminent or life-threatening deterioration of the following conditions:  DKA, hx of alcohol abuse, multiple comorbid conditions.    Critical care was time spent personally by me on the following activities:  Development of treatment plan with patient or surrogate, discussions with consultants, evaluation of patient's response to treatment, examination of patient, obtaining history from patient or surrogate, ordering and performing treatments and interventions, ordering and review of laboratory studies and re-evaluation of patient's condition.  I assumed direction of critical care for this patient from another provider in my specialty: no    This document was prepared using Dragon voice recognition software and may include unintentional dictation errors.    Vida RiggerFuad Chinita Schimpf, M.D.  Division of Pulmonary & Critical  Care Medicine  [facility]

## 2019-07-28 NOTE — Progress Notes (Signed)
Initial Nutrition Assessment  DOCUMENTATION CODES:   Severe malnutrition in context of social or environmental circumstances, Underweight  INTERVENTION:  Will update HealthTouch so patient can receive double protein portions as requested.   Provide Ensure Max Protein po BID, each supplement provides 150 kcal and 30 grams of protein.  Continue daily MVI, thiamine 527 mg daily, folic acid 1 mg daily.  NUTRITION DIAGNOSIS:   Severe Malnutrition related to social / environmental circumstances(hx substance abuse, inadequate oral intake) as evidenced by severe fat depletion, severe muscle depletion.  GOAL:   Patient will meet greater than or equal to 90% of their needs  MONITOR:   PO intake, Supplement acceptance, Labs, Weight trends, Skin, I & O's  REASON FOR ASSESSMENT:   Other (Comment)(Low BMI)    ASSESSMENT:   58 year old male with PMHx of HTN, substance abuse, DM, GERD, brain aneurysm admitted with DKA, AKI, acute gastritis.   Patient sleeping at time of RD assessment and RD unable to wake patient up to provide history. Unsure if he may have just been pretending to be asleep as directly prior to RD assessment patient was getting his vitals taken and RD could hear him talking through door. He is known to our service from previous admissions. He typically eats well and requests double protein portions at meals.    Weights in chart fluctuate between 60-70 kg so difficult to trend. Patient is currently 61.7 kg (136.02 lbs).  Medications reviewed and include: folic acid 1 mg daily, Novolog 0-9 units TID, Novolog 0-5 units QHS, Novolog 5 units TID with meals, Lantus 15 units QHS, MVI daily, thiamine 100 mg daily.  Labs reviewed: CBG 109-332, Potassium 3.5, BUN 41, Creatinine 1.55, Phosphorus 2.4, Magnesium 2.1.  NUTRITION - FOCUSED PHYSICAL EXAM:    Most Recent Value  Orbital Region  Severe depletion  Upper Arm Region  Severe depletion  Thoracic and Lumbar Region  Moderate  depletion  Buccal Region  Severe depletion  Temple Region  Severe depletion  Clavicle Bone Region  Severe depletion  Clavicle and Acromion Bone Region  Severe depletion  Scapular Bone Region  Severe depletion  Dorsal Hand  Moderate depletion  Patellar Region  Severe depletion  Anterior Thigh Region  Severe depletion  Posterior Calf Region  Severe depletion  Edema (RD Assessment)  None  Hair  Reviewed  Eyes  Reviewed  Mouth  Reviewed  Skin  Reviewed  Nails  Reviewed     Diet Order:   Diet Order            Diet Carb Modified Fluid consistency: Thin; Room service appropriate? Yes  Diet effective now             EDUCATION NEEDS:   Not appropriate for education at this time  Skin:  Skin Assessment: Reviewed RN Assessment  Last BM:  07/28/2019 per chart  Height:   Ht Readings from Last 1 Encounters:  07/27/19 6' (1.829 m)   Weight:   Wt Readings from Last 1 Encounters:  07/27/19 61.7 kg   Ideal Body Weight:  80.9 kg  BMI:  Body mass index is 18.45 kg/m.  Estimated Nutritional Needs:   Kcal:  1900-2100  Protein:  95-105 grams  Fluid:  1.9-2.1 L/day  Willey Blade, MS, RD, LDN Office: [phone] Pager: [phone] After Hours/Weekend Pager: [phone]

## 2019-07-29 ENCOUNTER — Ambulatory Visit: Payer: Self-pay | Admitting: Adult Health

## 2019-07-29 LAB — GLUCOSE, CAPILLARY
Glucose-Capillary: 211 mg/dL — ABNORMAL HIGH (ref 70–99)
Glucose-Capillary: 143 mg/dL — ABNORMAL HIGH (ref 70–99)
Glucose-Capillary: 254 mg/dL — ABNORMAL HIGH (ref 70–99)
Glucose-Capillary: 189 mg/dL — ABNORMAL HIGH (ref 70–99)

## 2019-07-29 LAB — BASIC METABOLIC PANEL
Anion gap: 11 (ref 5–15)
BUN: 22 mg/dL — ABNORMAL HIGH (ref 6–20)
CO2: 25 mmol/L (ref 22–32)
Calcium: 8.6 mg/dL — ABNORMAL LOW (ref 8.9–10.3)
Chloride: 98 mmol/L (ref 98–111)
Creatinine, Ser: 0.99 mg/dL (ref 0.61–1.24)
GFR calc Af Amer: >60 mL/min (ref >60)
GFR calc non Af Amer: >60 mL/min (ref >60)
Glucose, Bld: 224 mg/dL — ABNORMAL HIGH (ref 70–99)
Potassium: 3.5 mmol/L (ref 3.5–5.1)
Sodium: 134 mmol/L — ABNORMAL LOW (ref 135–145)

## 2019-07-29 MED ORDER — INSULIN GLARGINE 100 UNIT/ML ~~LOC~~ SOLN
30.0000 [IU] | Freq: Every day | SUBCUTANEOUS | Status: DC
  Filled 2019-07-29: qty 0.3

## 2019-07-29 MED ORDER — ADULT MULTIVITAMIN W/MINERALS CH: 1.0000 | ORAL_TABLET | Freq: Every day | ORAL | 0 refills | Status: DC

## 2019-07-29 MED ORDER — OXYCODONE HCL 5 MG PO TABS
5.0000 mg | ORAL_TABLET | Freq: Once | ORAL | Status: AC
  Administered 2019-07-29: 5 mg via ORAL

## 2019-07-29 NOTE — Discharge Instructions (Signed)
Keep log of your sugars as before

## 2019-07-29 NOTE — Clinical Social Work Note (Signed)
CSW consulted for ETOH issues. CSW attempted to see patient on 9/21 and again on 9/22. Patient would not speak with CSW and states he has no needs. CSW will sign off.   [place] [phone]

## 2019-07-29 NOTE — Progress Notes (Signed)
[facility]  ROUNDING NOTE   Subjective:  Patient seen at bedside.  Moved to floor care.   No new renal function testing today.  Objective:  Vital signs in last 24 hours:  Temp:  [97.7 F (36.5 C)-98.9 F (37.2 C)] 97.7 F (36.5 C) (09/22 0419) Pulse Rate:  [69-88] 69 (09/22 0419) Resp:  [16-24] 16 (09/22 0419) BP: (114-146)/(65-96) 141/96 (09/22 0419) SpO2:  [95 %-100 %] 97 % (09/22 0419)  Weight change:  Filed Weights   07/27/19 1736 07/27/19 2038  Weight: 72.6 kg 61.7 kg    Intake/Output: I/O last 3 completed shifts: In: 5190.4 [P.O.:960; I.V.:4181.5; IV Piggyback:48.9] Out: 2050 [Urine:2050]   Intake/Output this shift:  Total I/O In: 480 [P.O.:480] Out: 200 [Urine:200]  Physical Exam: General: No acute distress  Head: Normocephalic, atraumatic. Moist oral mucosal membranes  Eyes: Anicteric  Neck: Supple, trachea midline  Lungs:  Clear to auscultation, normal effort  Heart: S1S2 no rubs  Abdomen:  Soft, nontender, bowel sounds present  Extremities: No peripheral edema.  Neurologic: Awake, alert, following commands  Skin: No lesions       Basic Metabolic Panel: Recent Labs  Lab 07/27/19 1741 07/27/19 2326 07/28/19 0623  NA 134* 143 143  K 7.1* 3.9 3.5  CL 98 111 109  CO2 <7* 16* 25  GLUCOSE 855* 321* 109*  BUN 52* 48* 41*  CREATININE 3.30* 2.52* 1.55*  CALCIUM 9.2 8.8* 8.7*  MG  --  2.4 2.1  PHOS  --  3.4 2.4*    Liver Function Tests: No results for input(s): AST, ALT, ALKPHOS, BILITOT, PROT, ALBUMIN in the last 168 hours. No results for input(s): LIPASE, AMYLASE in the last 168 hours. No results for input(s): AMMONIA in the last 168 hours.  CBC: Recent Labs  Lab 07/27/19 1741 07/28/19 0437  WBC 22.4* 12.6*  NEUTROABS 19.9*  --   HGB 14.0 11.1*  HCT 46.3 31.3*  MCV 107.2* 91.0  PLT 322 242    Cardiac Enzymes: No results for input(s): CKTOTAL, CKMB, CKMBINDEX, TROPONINI in the last 168 hours.  BNP: Invalid  input(s): POCBNP  CBG: Recent Labs  Lab 07/28/19 2132 07/28/19 2324 07/29/19 0117 07/29/19 0547 07/29/19 0746  GLUCAP 463* 383* 211* 143* 254*    Microbiology: Results for orders placed or performed during the hospital encounter of 07/27/19  SARS Coronavirus 2 [facility] order, Performed in [facility] hospital lab) Nasopharyngeal Nasopharyngeal Swab     Status: None   Collection Time: 07/27/19  6:28 PM   Specimen: Nasopharyngeal Swab  Result Value Ref Range Status   SARS Coronavirus 2 NEGATIVE NEGATIVE Final    Comment: (NOTE) If result is NEGATIVE SARS-CoV-2 target nucleic acids are NOT DETECTED. The SARS-CoV-2 RNA is generally detectable in upper and lower  respiratory specimens during the acute phase of infection. The lowest  concentration of SARS-CoV-2 viral copies this assay can detect is 250  copies / mL. A negative result does not preclude SARS-CoV-2 infection  and should not be used as the sole basis for treatment or other  patient management decisions.  A negative result may occur with  improper specimen collection / handling, submission of specimen other  than nasopharyngeal swab, presence of viral mutation(s) within the  areas targeted by this assay, and inadequate number of viral copies  (<250 copies / mL). A negative result must be combined with clinical  observations, patient history, and epidemiological information. If result is POSITIVE SARS-CoV-2 target nucleic acids are DETECTED. The SARS-CoV-2  RNA is generally detectable in upper and lower  respiratory specimens dur ing the acute phase of infection.  Positive  results are indicative of active infection with SARS-CoV-2.  Clinical  correlation with patient history and other diagnostic information is  necessary to determine patient infection status.  Positive results do  not rule out bacterial infection or co-infection with other viruses. If result is PRESUMPTIVE POSTIVE SARS-CoV-2 nucleic acids MAY BE  PRESENT.   A presumptive positive result was obtained on the submitted specimen  and confirmed on repeat testing.  While 2019 novel coronavirus  (SARS-CoV-2) nucleic acids may be present in the submitted sample  additional confirmatory testing may be necessary for epidemiological  and / or clinical management purposes  to differentiate between  SARS-CoV-2 and other Sarbecovirus currently known to infect humans.  If clinically indicated additional testing with an alternate test  methodology [phone]) is advised. The SARS-CoV-2 RNA is generally  detectable in upper and lower respiratory sp ecimens during the acute  phase of infection. The expected result is Negative. Fact Sheet for Patients:  BoilerBrush.com.cy Fact Sheet for Healthcare Providers: https://pope.com/ This test is not yet approved or cleared by the [place] FDA and has been authorized for detection and/or diagnosis of SARS-CoV-2 by FDA under an Emergency Use Authorization (EUA).  This EUA will remain in effect (meaning this test can be used) for the duration of the COVID-19 declaration under Section 564(b)(1) of the Act, 21 U.S.C. section 360bbb-3(b)(1), unless the authorization is terminated or revoked sooner. Performed at [facility], [address] [place], [place] [address]   MRSA PCR Screening     Status: None   Collection Time: 07/27/19  8:43 PM   Specimen: Nasal Mucosa; Nasopharyngeal  Result Value Ref Range Status   MRSA by PCR NEGATIVE NEGATIVE Final    Comment:        The GeneXpert MRSA Assay (FDA approved for NASAL specimens only), is one component of a comprehensive MRSA colonization surveillance program. It is not intended to diagnose MRSA infection nor to guide or monitor treatment for MRSA infections. Performed at [facility], [address] [place], [place] [address]     Coagulation Studies: No results for input(s):  LABPROT, INR in the last 72 hours.  Urinalysis: No results for input(s): COLORURINE, LABSPEC, PHURINE, GLUCOSEU, HGBUR, BILIRUBINUR, KETONESUR, PROTEINUR, UROBILINOGEN, NITRITE, LEUKOCYTESUR in the last 72 hours.  Invalid input(s): APPERANCEUR    Imaging: Dg Abd 1 View  Result Date: 07/27/2019 CLINICAL DATA:  Abdomen pain, low back pain after fall EXAM: ABDOMEN - 1 VIEW COMPARISON:  11/05/2016 FINDINGS: Nonobstructed bowel gas pattern with large amount of stool in the colon. Possible acute right tenth nondisplaced rib fracture. Phleboliths in the pelvis. IMPRESSION: 1. Nonobstructed gas pattern with large amount of stool in the colon 2. Possible nondisplaced right tenth rib fracture Electronically Signed   By: Jasmine Pang M.D.   On: 07/27/2019 20:29   Dg Chest Port 1 View  Result Date: 07/27/2019 CLINICAL DATA:  Dyspnea EXAM: PORTABLE CHEST 1 VIEW COMPARISON:  06/09/2019 FINDINGS: The heart size and mediastinal contours are stable. Both lungs are clear. The visualized skeletal structures are unremarkable. Two metallic BBs again seen projecting over the lower right hemithorax. IMPRESSION: No active disease. Electronically Signed   By: Duanne Guess M.D.   On: 07/27/2019 18:07     Medications:    . folic acid  1 mg Oral Daily  . heparin  5,000 Units Subcutaneous Q8H  .  influenza vac split quadrivalent PF  0.5 mL Intramuscular Tomorrow-1000  . insulin aspart  0-9 Units Subcutaneous TID AC & HS  . insulin aspart  5 Units Subcutaneous TID WC  . insulin glargine  15 Units Subcutaneous QHS  . multivitamin with minerals  1 tablet Oral Daily  . Ensure Max Protein  11 oz Oral BID BM  . sodium chloride flush  3 mL Intravenous Q12H  . thiamine  100 mg Oral Daily   Or  . thiamine  100 mg Intravenous Daily   acetaminophen, LORazepam **OR** LORazepam, oxyCODONE  Assessment/ Plan:  58 y.o. male withdiabetes mellitus type 2, recent acute renal failure, admitted now with acute renal  failure, hyperglycemia, metabolic acidosis.   1.  Acute renal failure. No new renal function testing available today.  Will plan to repeat renal parameters today.    2.  Metabolic acidosis.  Improved, serum bicarb up to 25, repeat this today.   3.  Anemia unspecified.  Continue to periodically monitor hgb.    LOS: 2 Sabiha Sura 9/22/202011:51 AM

## 2019-07-29 NOTE — Progress Notes (Signed)
Inpatient Diabetes Program Recommendations  AACE/ADA: New Consensus Statement on Inpatient Glycemic Control (2015)  Target Ranges:  Prepandial:   less than 140 mg/dL      Peak postprandial:   less than 180 mg/dL (1-2 hours)      Critically ill patients:  140 - 180 mg/dL   Lab Results  Component Value Date   GLUCAP 254 (H) 07/29/2019   HGBA1C 13.8 (H) 05/30/2019    Review of Glycemic Control  Diabetes history: DM2 Outpatient Diabetes medications: Lantus 40 units daily + Novolog 0-30 units tid meal coverage + Metformin 1 gm bid Current orders for Inpatient glycemic control: Lantus 15 units qd + Novolog 5 units tid meal coverage if eats 50% + Novolog moderate correction q 4 hrs.  Inpatient Diabetes Program Recommendations:   -Increase Lantus to 30 units Secure chat to Dr. Fritzi Mandes.  Thank you, Nani Gasser. Nelline Lio, RN, MSN, CDE  Diabetes Coordinator Inpatient Glycemic Control Team Team Pager [phone] (8am-5pm) 07/29/2019 12:06 PM

## 2019-07-29 NOTE — Discharge Summary (Signed)
[facility] [place] NAME: Spencer [name]    MR#:  [id number]  DATE OF BIRTH:  06-May-1961  DATE OF ADMISSION:  07/27/2019 ADMITTING PHYSICIAN: Otila Back, MD  DATE OF DISCHARGE: 07/29/2019  PRIMARY CARE PHYSICIAN: Jodi Marble, MD    ADMISSION DIAGNOSIS:  Hyperkalemia [E87.5] Abdominal pain [R10.9] Acute renal failure, unspecified acute renal failure type ([place]) [N17.9] Diabetic ketoacidosis without coma associated with diabetes mellitus due to underlying condition ([place]) [E08.10]  DISCHARGE DIAGNOSIS:  DKA with Type 2 diabetes AG acidosis--resolved Acute renal failure Cocaine abuse SECONDARY DIAGNOSIS:   Past Medical History:  Diagnosis Date  . Brain aneurysm   . Broken bones    clavical, ankle, arms toes wriast   . Diabetes mellitus without complication ([place])   . Dysrhythmia   . GERD (gastroesophageal reflux disease)   . Hypertension   . Substance abuse [facility])     HOSPITAL COURSE:  58 year old with history of diabetes mellitus,hypertension on chronic back pain being admitted for management of acute kidney injury and DKA.  1.Diabetic ketoacidosis with type 2 DM -Patient presented with nausea and vomiting and some shortness of breath. -Chest x-ray negative.  -now off IV insulin gtt--tolerating po diet -on lantus and novolog -appreciate DM coordinator input--Lantus 40 units , SSI  -holding metformin (creat 1.5)  2.Acute gastritis--resolved Likely due to DKA. Patient presented with nausea and vomiting. No significant abdominal pains.  3.Leukocytosis with white count of 22,000--12.6K Most likely reactive. So far no evidence of infectious process.  4.Acute kidney injury with metabolic acidosis with pH of 6.9 Case was discussed with nephrologist by ED physician. recived IV Bicarb drip  -creat 2.88--1.55 -seen by Dr Holley Raring  5.Hyperkalemia with potassium of 7.1 Expect shift of potassium  intracellularly with ongoing insulin drip. Patient also given 1 amp of bicarb already. was on bicarb drip. - K down to 3.5  6.Chronic low back pain Pain control as needed  7.DVT prophylaxis;heparin  8. Polysubstance abuse UDS +cocaine Advised abstinence  D/c home with out pt f/u  CONSULTS OBTAINED:    DRUG ALLERGIES:  No Known Allergies  DISCHARGE MEDICATIONS:   Allergies as of 07/29/2019   No Known Allergies     Medication List    STOP taking these medications   metFORMIN 1000 MG tablet Commonly known as: GLUCOPHAGE     TAKE these medications   blood glucose meter kit and supplies Check blood sugar 4 times daily   insulin glargine 100 UNIT/ML injection Commonly known as: LANTUS Inject 40 Units into the skin daily.   Insulin Pen Needle 31G X 5 MM Misc Check blood sugars 4 times daily   multivitamin with minerals Tabs tablet Take 1 tablet by mouth daily. Start taking on: July 30, 2019   NovoLOG FlexPen 100 UNIT/ML FlexPen Generic drug: insulin aspart Inject 0-30 Units into the skin 3 (three) times daily.   pantoprazole 40 MG tablet Commonly known as: PROTONIX Take 1 tablet (40 mg total) by mouth daily.       If you experience worsening of your admission symptoms, develop shortness of breath, life threatening emergency, suicidal or homicidal thoughts you must seek medical attention immediately by calling 911 or calling your MD immediately  if symptoms less severe.  You Must read complete instructions/literature along with all the possible adverse reactions/side effects for all the Medicines you take and that have been prescribed to you. Take any new Medicines after you have completely understood and accept  all the possible adverse reactions/side effects.   Please note  You were cared for by a hospitalist during your hospital stay. If you have any questions about your discharge medications or the care you received while you were in the hospital  after you are discharged, you can call the unit and asked to speak with the hospitalist on call if the hospitalist that took care of you is not available. Once you are discharged, your primary care physician will handle any further medical issues. Please note that NO REFILLS for any discharge medications will be authorized once you are discharged, as it is imperative that you return to your primary care physician (or establish a relationship with a primary care physician if you do not have one) for your aftercare needs so that they can reassess your need for medications and monitor your lab values. Today   SUBJECTIVE   Back pain  VITAL SIGNS:  Blood pressure 120/85, pulse 80, temperature 98.3 F (36.8 C), temperature source Oral, resp. rate 18, height 6' (1.829 m), weight 61.7 kg, SpO2 94 %.  I/O:    Intake/Output Summary (Last 24 hours) at 07/29/2019 1313 Last data filed at 07/29/2019 1005 Gross per 24 hour  Intake 960 ml  Output 950 ml  Net 10 ml    PHYSICAL EXAMINATION:  GENERAL:  58 y.o.-year-old patient lying in the bed with no acute distress. disheveled EYES: Pupils equal, round, reactive to light and accommodation. No scleral icterus. Extraocular muscles intact.  HEENT: Head atraumatic, normocephalic. Oropharynx and nasopharynx clear.  NECK:  Supple, no jugular venous distention. No thyroid enlargement, no tenderness.  LUNGS: Normal breath sounds bilaterally, no wheezing, rales,rhonchi or crepitation. No use of accessory muscles of respiration.  CARDIOVASCULAR: S1, S2 normal. No murmurs, rubs, or gallops.  ABDOMEN: Soft, non-tender, non-distended. Bowel sounds present. No organomegaly or mass.  EXTREMITIES: No pedal edema, cyanosis, or clubbing.  NEUROLOGIC: Cranial nerves II through XII are intact. Muscle strength 5/5 in all extremities. Sensation intact. Gait not checked.  PSYCHIATRIC: The patient is alert and oriented x 3.  SKIN: No obvious rash, lesion, or ulcer.   DATA  REVIEW:   CBC  Recent Labs  Lab 07/28/19 0437  WBC 12.6*  HGB 11.1*  HCT 31.3*  PLT 242    Chemistries  Recent Labs  Lab 07/28/19 0623  NA 143  K 3.5  CL 109  CO2 25  GLUCOSE 109*  BUN 41*  CREATININE 1.55*  CALCIUM 8.7*  MG 2.1    Microbiology Results   Recent Results (from the past 240 hour(s))  SARS Coronavirus 2 [facility] order, Performed in [facility] hospital lab) Nasopharyngeal Nasopharyngeal Swab     Status: None   Collection Time: 07/27/19  6:28 PM   Specimen: Nasopharyngeal Swab  Result Value Ref Range Status   SARS Coronavirus 2 NEGATIVE NEGATIVE Final    Comment: (NOTE) If result is NEGATIVE SARS-CoV-2 target nucleic acids are NOT DETECTED. The SARS-CoV-2 RNA is generally detectable in upper and lower  respiratory specimens during the acute phase of infection. The lowest  concentration of SARS-CoV-2 viral copies this assay can detect is 250  copies / mL. A negative result does not preclude SARS-CoV-2 infection  and should not be used as the sole basis for treatment or other  patient management decisions.  A negative result may occur with  improper specimen collection / handling, submission of specimen other  than nasopharyngeal swab, presence of viral mutation(s) within the  areas targeted by  this assay, and inadequate number of viral copies  (<250 copies / mL). A negative result must be combined with clinical  observations, patient history, and epidemiological information. If result is POSITIVE SARS-CoV-2 target nucleic acids are DETECTED. The SARS-CoV-2 RNA is generally detectable in upper and lower  respiratory specimens dur ing the acute phase of infection.  Positive  results are indicative of active infection with SARS-CoV-2.  Clinical  correlation with patient history and other diagnostic information is  necessary to determine patient infection status.  Positive results do  not rule out bacterial infection or co-infection with other  viruses. If result is PRESUMPTIVE POSTIVE SARS-CoV-2 nucleic acids MAY BE PRESENT.   A presumptive positive result was obtained on the submitted specimen  and confirmed on repeat testing.  While 2019 novel coronavirus  (SARS-CoV-2) nucleic acids may be present in the submitted sample  additional confirmatory testing may be necessary for epidemiological  and / or clinical management purposes  to differentiate between  SARS-CoV-2 and other Sarbecovirus currently known to infect humans.  If clinically indicated additional testing with an alternate test  methodology [phone]) is advised. The SARS-CoV-2 RNA is generally  detectable in upper and lower respiratory sp ecimens during the acute  phase of infection. The expected result is Negative. Fact Sheet for Patients:  StrictlyIdeas.no Fact Sheet for Healthcare Providers: BankingDealers.co.za This test is not yet approved or cleared by the [place] FDA and has been authorized for detection and/or diagnosis of SARS-CoV-2 by FDA under an Emergency Use Authorization (EUA).  This EUA will remain in effect (meaning this test can be used) for the duration of the COVID-19 declaration under Section 564(b)(1) of the Act, 21 U.S.C. section 360bbb-3(b)(1), unless the authorization is terminated or revoked sooner. Performed at [facility], [address] [place], [place] [address]   MRSA PCR Screening     Status: None   Collection Time: 07/27/19  8:43 PM   Specimen: Nasal Mucosa; Nasopharyngeal  Result Value Ref Range Status   MRSA by PCR NEGATIVE NEGATIVE Final    Comment:        The GeneXpert MRSA Assay (FDA approved for NASAL specimens only), is one component of a comprehensive MRSA colonization surveillance program. It is not intended to diagnose MRSA infection nor to guide or monitor treatment for MRSA infections. Performed at [facility], [address]  [place], [place] [address]     RADIOLOGY:  Bea Graff 1 View  Result Date: 07/27/2019 CLINICAL DATA:  Abdomen pain, low back pain after fall EXAM: ABDOMEN - 1 VIEW COMPARISON:  11/05/2016 FINDINGS: Nonobstructed bowel gas pattern with large amount of stool in the colon. Possible acute right tenth nondisplaced rib fracture. Phleboliths in the pelvis. IMPRESSION: 1. Nonobstructed gas pattern with large amount of stool in the colon 2. Possible nondisplaced right tenth rib fracture Electronically Signed   By: Donavan Foil M.D.   On: 07/27/2019 20:29   Dg Chest Port 1 View  Result Date: 07/27/2019 CLINICAL DATA:  Dyspnea EXAM: PORTABLE CHEST 1 VIEW COMPARISON:  06/09/2019 FINDINGS: The heart size and mediastinal contours are stable. Both lungs are clear. The visualized skeletal structures are unremarkable. Two metallic BBs again seen projecting over the lower right hemithorax. IMPRESSION: No active disease. Electronically Signed   By: Davina Poke M.D.   On: 07/27/2019 18:07     CODE STATUS:     Code Status Orders  (From admission, onward)         Start  Ordered   07/27/19 1919  Full code  Continuous     07/27/19 1922        Code Status History    Date Active Date Inactive Code Status Order ID Comments User Context   06/09/2019 2125 06/12/2019 2007 Full Code [id number]  Mayer Camel, NP ED   05/28/2019 1845 06/04/2019 2148 Full Code [id number]  Epifanio Lesches, MD ED   02/19/2019 0122 02/19/2019 1901 Full Code [id number]  Lance Coon, MD Inpatient   02/08/2019 0130 02/13/2019 1546 Full Code [id number]  Harrie Foreman, MD ED   01/17/2019 0412 01/18/2019 1903 Full Code [id number]  Mansy, Arvella Merles, MD ED   01/06/2019 1729 01/08/2019 1427 Full Code [id number]  Loletha Grayer, MD ED   10/25/2018 1356 10/27/2018 1345 Full Code [id number]  Dustin Flock, MD Inpatient   10/23/2018 1828 10/24/2018 1759 Full Code [id number]  Dustin Flock, MD Inpatient   01/15/2017 1417 01/18/2017 1716 Full Code [id number]   Gladstone Lighter, MD Inpatient   11/06/2016 0319 11/09/2016 1508 Full Code [id number]  Vickii Chafe Corine Shelter, MD Inpatient   11/06/2016 0229 11/06/2016 0319 Full Code [id number]  de Flo Shanks, MD Inpatient   04/04/2015 0008 04/06/2015 1418 Full Code [id number]  Phillips Grout, MD Inpatient   Advance Care Planning Activity      TOTAL TIME TAKING CARE OF THIS PATIENT: *40* minutes.    Fritzi Mandes M.D on 07/29/2019 at 1:13 PM  Between 7am to 6pm - Pager - [phone] After 6pm go to [email or web address] - password [email or web address]  [facility]  Office  [phone]  CC: Primary care physician; Jodi Marble, MD

## 2019-07-29 NOTE — Progress Notes (Signed)
Spencer [name] to be D/C'd home per MD order.  Discussed prescriptions and follow up appointments with the patient. Prescriptions given to patient, medication list explained in detail. Pt verbalized understanding.  Allergies as of 07/29/2019   No Known Allergies     Medication List    STOP taking these medications   metFORMIN 1000 MG tablet Commonly known as: GLUCOPHAGE     TAKE these medications   blood glucose meter kit and supplies Check blood sugar 4 times daily   insulin glargine 100 UNIT/ML injection Commonly known as: LANTUS Inject 40 Units into the skin daily.   Insulin Pen Needle 31G X 5 MM Misc Check blood sugars 4 times daily   multivitamin with minerals Tabs tablet Take 1 tablet by mouth daily. Start taking on: July 30, 2019   NovoLOG FlexPen 100 UNIT/ML FlexPen Generic drug: insulin aspart Inject 0-30 Units into the skin 3 (three) times daily.   pantoprazole 40 MG tablet Commonly known as: PROTONIX Take 1 tablet (40 mg total) by mouth daily.       Vitals:   07/29/19 0419 07/29/19 1201  BP: (!) 141/96 120/85  Pulse: 69 80  Resp: 16 18  Temp: 97.7 F (36.5 C) 98.3 F (36.8 C)  SpO2: 97% 94%    Skin clean, dry and intact without evidence of skin break down, no evidence of skin tears noted. IV catheter discontinued intact. Site without signs and symptoms of complications. Dressing and pressure applied. Pt denies pain at this time. No complaints noted.  An After Visit Summary was printed and given to the patient. Patient escorted via [place], and D/C home via private auto. Sister provided transport.   Danali Marinos A Sutter Ahlgren

## 2019-08-05 ENCOUNTER — Ambulatory Visit: Payer: Self-pay | Admitting: Urology

## 2019-08-05 ENCOUNTER — Encounter: Payer: Self-pay | Admitting: Urology

## 2019-08-06 ENCOUNTER — Other Ambulatory Visit: Payer: Self-pay

## 2019-08-06 ENCOUNTER — Inpatient Hospital Stay
Admission: EM | Admit: 2019-08-06 | Discharge: 2019-08-08 | DRG: 637 | Disposition: A | Discharge status: Home / Self Care | Payer: Medicaid Other | Attending: Internal Medicine | Admitting: Internal Medicine

## 2019-08-06 ENCOUNTER — Emergency Department: Payer: Medicaid Other

## 2019-08-06 DIAGNOSIS — Z8249 Family history of ischemic heart disease and other diseases of the circulatory system: Secondary | ICD-10-CM

## 2019-08-06 DIAGNOSIS — E875 Hyperkalemia: Secondary | ICD-10-CM | POA: Diagnosis present

## 2019-08-06 DIAGNOSIS — I472 Ventricular tachycardia, unspecified: Secondary | ICD-10-CM

## 2019-08-06 DIAGNOSIS — D72829 Elevated white blood cell count, unspecified: Secondary | ICD-10-CM | POA: Diagnosis present

## 2019-08-06 DIAGNOSIS — I10 Essential (primary) hypertension: Secondary | ICD-10-CM | POA: Diagnosis present

## 2019-08-06 DIAGNOSIS — F1411 Cocaine abuse, in remission: Secondary | ICD-10-CM | POA: Diagnosis present

## 2019-08-06 DIAGNOSIS — E1110 Type 2 diabetes mellitus with ketoacidosis without coma: Principal | ICD-10-CM | POA: Diagnosis present

## 2019-08-06 DIAGNOSIS — E860 Dehydration: Secondary | ICD-10-CM | POA: Diagnosis present

## 2019-08-06 DIAGNOSIS — K219 Gastro-esophageal reflux disease without esophagitis: Secondary | ICD-10-CM | POA: Diagnosis present

## 2019-08-06 DIAGNOSIS — R4182 Altered mental status, unspecified: Secondary | ICD-10-CM

## 2019-08-06 DIAGNOSIS — I671 Cerebral aneurysm, nonruptured: Secondary | ICD-10-CM | POA: Diagnosis present

## 2019-08-06 DIAGNOSIS — Z79899 Other long term (current) drug therapy: Secondary | ICD-10-CM

## 2019-08-06 DIAGNOSIS — Z20828 Contact with and (suspected) exposure to other viral communicable diseases: Secondary | ICD-10-CM | POA: Diagnosis present

## 2019-08-06 DIAGNOSIS — G9341 Metabolic encephalopathy: Secondary | ICD-10-CM | POA: Diagnosis present

## 2019-08-06 DIAGNOSIS — I4891 Unspecified atrial fibrillation: Secondary | ICD-10-CM | POA: Diagnosis present

## 2019-08-06 DIAGNOSIS — Z794 Long term (current) use of insulin: Secondary | ICD-10-CM

## 2019-08-06 DIAGNOSIS — F17200 Nicotine dependence, unspecified, uncomplicated: Secondary | ICD-10-CM | POA: Diagnosis present

## 2019-08-06 DIAGNOSIS — N179 Acute kidney failure, unspecified: Secondary | ICD-10-CM | POA: Diagnosis present

## 2019-08-06 LAB — CBC WITH DIFFERENTIAL/PLATELET
Abs Immature Granulocytes: 0.58 10*3/uL — ABNORMAL HIGH (ref 0.00–0.07)
Basophils Absolute: 0.1 10*3/uL (ref 0.0–0.1)
Basophils Relative: 1 %
Eosinophils Absolute: 0 10*3/uL (ref 0.0–0.5)
Eosinophils Relative: 0 %
HCT: 47.9 % (ref 39.0–52.0)
Hemoglobin: 14 g/dL (ref 13.0–17.0)
Immature Granulocytes: 3 %
Lymphocytes Relative: 6 %
Lymphs Abs: 1.3 10*3/uL (ref 0.7–4.0)
MCH: 32 pg (ref 26.0–34.0)
MCHC: 29.2 g/dL — ABNORMAL LOW (ref 30.0–36.0)
MCV: 109.6 fL — ABNORMAL HIGH (ref 80.0–100.0)
Monocytes Absolute: 1.3 10*3/uL — ABNORMAL HIGH (ref 0.1–1.0)
Monocytes Relative: 6 %
Neutro Abs: 18.9 10*3/uL — ABNORMAL HIGH (ref 1.7–7.7)
Neutrophils Relative %: 84 %
Platelets: 701 10*3/uL — ABNORMAL HIGH (ref 150–400)
RBC: 4.37 MIL/uL (ref 4.22–5.81)
RDW: 13.7 % (ref 11.5–15.5)
WBC: 22.3 10*3/uL — ABNORMAL HIGH (ref 4.0–10.5)
nRBC: 0 % (ref 0.0–0.2)

## 2019-08-06 LAB — BLOOD GAS, VENOUS
Acid-base deficit: 27.4 mmol/L — ABNORMAL HIGH (ref 0.0–2.0)
Bicarbonate: 4.5 mmol/L — ABNORMAL LOW (ref 20.0–28.0)
O2 Saturation: 41.9 %
Patient temperature: 37
pCO2, Ven: 23 mmHg — ABNORMAL LOW (ref 44.0–60.0)
pH, Ven: 6.9 — CL (ref 7.250–7.430)
pO2, Ven: 43 mmHg (ref 32.0–45.0)

## 2019-08-06 LAB — BRAIN NATRIURETIC PEPTIDE: B Natriuretic Peptide: 101 pg/mL — ABNORMAL HIGH (ref 0.0–100.0)

## 2019-08-06 LAB — ETHANOL: Alcohol, Ethyl (B): <10 mg/dL (ref <10)

## 2019-08-06 MED ORDER — LACTATED RINGERS IV BOLUS
1000.0000 mL | Freq: Once | INTRAVENOUS | Status: AC
  Administered 2019-08-06: 1000 mL via INTRAVENOUS

## 2019-08-06 MED ORDER — ONDANSETRON HCL 4 MG/2ML IJ SOLN
4.0000 mg | Freq: Once | INTRAMUSCULAR | Status: AC
  Administered 2019-08-06: 4 mg via INTRAVENOUS
  Filled 2019-08-06: qty 2

## 2019-08-06 MED ORDER — MAGNESIUM SULFATE 2 GM/50ML IV SOLN
INTRAVENOUS | Status: AC
  Filled 2019-08-06: qty 50

## 2019-08-06 MED ORDER — AMIODARONE IV BOLUS ONLY 150 MG/100ML
INTRAVENOUS | Status: AC
  Filled 2019-08-06: qty 100

## 2019-08-06 MED ORDER — SODIUM CHLORIDE 0.9 % IV BOLUS
1000.0000 mL | Freq: Once | INTRAVENOUS | Status: AC
  Administered 2019-08-06: 1000 mL via INTRAVENOUS

## 2019-08-06 MED ORDER — MAGNESIUM SULFATE 2 GM/50ML IV SOLN
2.0000 g | Freq: Once | INTRAVENOUS | Status: AC
  Administered 2019-08-06: 2 g via INTRAVENOUS
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
## 2019-08-07 DIAGNOSIS — N179 Acute kidney failure, unspecified: Secondary | ICD-10-CM | POA: Diagnosis present

## 2019-08-07 DIAGNOSIS — D72829 Elevated white blood cell count, unspecified: Secondary | ICD-10-CM | POA: Diagnosis present

## 2019-08-07 DIAGNOSIS — I671 Cerebral aneurysm, nonruptured: Secondary | ICD-10-CM | POA: Diagnosis present

## 2019-08-07 DIAGNOSIS — E872 Acidosis: Secondary | ICD-10-CM | POA: Diagnosis not present

## 2019-08-07 DIAGNOSIS — Z79899 Other long term (current) drug therapy: Secondary | ICD-10-CM | POA: Diagnosis not present

## 2019-08-07 DIAGNOSIS — G9341 Metabolic encephalopathy: Secondary | ICD-10-CM | POA: Diagnosis present

## 2019-08-07 DIAGNOSIS — K219 Gastro-esophageal reflux disease without esophagitis: Secondary | ICD-10-CM | POA: Diagnosis present

## 2019-08-07 DIAGNOSIS — E1110 Type 2 diabetes mellitus with ketoacidosis without coma: Secondary | ICD-10-CM | POA: Diagnosis present

## 2019-08-07 DIAGNOSIS — Z794 Long term (current) use of insulin: Secondary | ICD-10-CM | POA: Diagnosis not present

## 2019-08-07 DIAGNOSIS — F17200 Nicotine dependence, unspecified, uncomplicated: Secondary | ICD-10-CM | POA: Diagnosis present

## 2019-08-07 DIAGNOSIS — I10 Essential (primary) hypertension: Secondary | ICD-10-CM | POA: Diagnosis present

## 2019-08-07 DIAGNOSIS — E0811 Diabetes mellitus due to underlying condition with ketoacidosis with coma: Secondary | ICD-10-CM | POA: Diagnosis not present

## 2019-08-07 DIAGNOSIS — E860 Dehydration: Secondary | ICD-10-CM | POA: Diagnosis present

## 2019-08-07 DIAGNOSIS — E875 Hyperkalemia: Secondary | ICD-10-CM | POA: Diagnosis present

## 2019-08-07 DIAGNOSIS — I4891 Unspecified atrial fibrillation: Secondary | ICD-10-CM

## 2019-08-07 DIAGNOSIS — Z8249 Family history of ischemic heart disease and other diseases of the circulatory system: Secondary | ICD-10-CM | POA: Diagnosis not present

## 2019-08-07 DIAGNOSIS — Z20828 Contact with and (suspected) exposure to other viral communicable diseases: Secondary | ICD-10-CM | POA: Diagnosis present

## 2019-08-07 DIAGNOSIS — R4182 Altered mental status, unspecified: Secondary | ICD-10-CM | POA: Diagnosis present

## 2019-08-07 DIAGNOSIS — I472 Ventricular tachycardia: Secondary | ICD-10-CM | POA: Diagnosis present

## 2019-08-07 DIAGNOSIS — F1411 Cocaine abuse, in remission: Secondary | ICD-10-CM | POA: Diagnosis present

## 2019-08-07 LAB — BASIC METABOLIC PANEL
Anion gap: 24 — ABNORMAL HIGH (ref 5–15)
Anion gap: 14 (ref 5–15)
BUN: 50 mg/dL — ABNORMAL HIGH (ref 6–20)
BUN: 43 mg/dL — ABNORMAL HIGH (ref 6–20)
CO2: 16 mmol/L — ABNORMAL LOW (ref 22–32)
CO2: 24 mmol/L (ref 22–32)
Calcium: 9 mg/dL (ref 8.9–10.3)
Calcium: 8.6 mg/dL — ABNORMAL LOW (ref 8.9–10.3)
Chloride: 102 mmol/L (ref 98–111)
Chloride: 104 mmol/L (ref 98–111)
Creatinine, Ser: 2.32 mg/dL — ABNORMAL HIGH (ref 0.61–1.24)
Creatinine, Ser: 1.74 mg/dL — ABNORMAL HIGH (ref 0.61–1.24)
GFR calc Af Amer: 34 mL/min — ABNORMAL LOW (ref >60)
GFR calc Af Amer: 48 mL/min — ABNORMAL LOW (ref >60)
GFR calc non Af Amer: 29 mL/min — ABNORMAL LOW (ref >60)
GFR calc non Af Amer: 42 mL/min — ABNORMAL LOW (ref >60)
Glucose, Bld: 392 mg/dL — ABNORMAL HIGH (ref 70–99)
Glucose, Bld: 146 mg/dL — ABNORMAL HIGH (ref 70–99)
Potassium: 4.6 mmol/L (ref 3.5–5.1)
Potassium: 3.9 mmol/L (ref 3.5–5.1)
Sodium: 142 mmol/L (ref 135–145)
Sodium: 142 mmol/L (ref 135–145)

## 2019-08-07 LAB — GLUCOSE, CAPILLARY
Glucose-Capillary: 120 mg/dL — ABNORMAL HIGH (ref 70–99)
Glucose-Capillary: 131 mg/dL — ABNORMAL HIGH (ref 70–99)
Glucose-Capillary: 134 mg/dL — ABNORMAL HIGH (ref 70–99)
Glucose-Capillary: 151 mg/dL — ABNORMAL HIGH (ref 70–99)
Glucose-Capillary: 155 mg/dL — ABNORMAL HIGH (ref 70–99)
Glucose-Capillary: 177 mg/dL — ABNORMAL HIGH (ref 70–99)
Glucose-Capillary: 179 mg/dL — ABNORMAL HIGH (ref 70–99)
Glucose-Capillary: 216 mg/dL — ABNORMAL HIGH (ref 70–99)
Glucose-Capillary: 332 mg/dL — ABNORMAL HIGH (ref 70–99)
Glucose-Capillary: 357 mg/dL — ABNORMAL HIGH (ref 70–99)
Glucose-Capillary: 455 mg/dL — ABNORMAL HIGH (ref 70–99)
Glucose-Capillary: 554 mg/dL (ref 70–99)
Glucose-Capillary: >600 mg/dL (ref 70–99)
Glucose-Capillary: >600 mg/dL (ref 70–99)

## 2019-08-07 LAB — URINALYSIS, COMPLETE (UACMP) WITH MICROSCOPIC
Bilirubin Urine: NEGATIVE
Glucose, UA: >=500 mg/dL — AB
Hgb urine dipstick: NEGATIVE
Ketones, ur: 80 mg/dL — AB
Leukocytes,Ua: NEGATIVE
Nitrite: NEGATIVE
Protein, ur: 30 mg/dL — AB
Specific Gravity, Urine: 1.018 (ref 1.005–1.030)
pH: 5 (ref 5.0–8.0)

## 2019-08-07 LAB — URINE DRUG SCREEN, QUALITATIVE (ARMC ONLY)
Amphetamines, Ur Screen: NOT DETECTED
Barbiturates, Ur Screen: NOT DETECTED
Benzodiazepine, Ur Scrn: NOT DETECTED
Cannabinoid 50 Ng, Ur ~~LOC~~: NOT DETECTED
Cocaine Metabolite,Ur ~~LOC~~: POSITIVE — AB
MDMA (Ecstasy)Ur Screen: NOT DETECTED
Methadone Scn, Ur: NOT DETECTED
Opiate, Ur Screen: NOT DETECTED
Phencyclidine (PCP) Ur S: NOT DETECTED
Tricyclic, Ur Screen: NOT DETECTED

## 2019-08-07 LAB — COMPREHENSIVE METABOLIC PANEL
ALT: 16 U/L (ref 0–44)
AST: 18 U/L (ref 15–41)
Albumin: 4.4 g/dL (ref 3.5–5.0)
Alkaline Phosphatase: 156 U/L — ABNORMAL HIGH (ref 38–126)
BUN: 52 mg/dL — ABNORMAL HIGH (ref 6–20)
CO2: <7 mmol/L — ABNORMAL LOW (ref 22–32)
Calcium: 9.5 mg/dL (ref 8.9–10.3)
Chloride: 88 mmol/L — ABNORMAL LOW (ref 98–111)
Creatinine, Ser: 2.95 mg/dL — ABNORMAL HIGH (ref 0.61–1.24)
GFR calc Af Amer: 26 mL/min — ABNORMAL LOW (ref >60)
GFR calc non Af Amer: 22 mL/min — ABNORMAL LOW (ref >60)
Glucose, Bld: 1126 mg/dL (ref 70–99)
Potassium: 7.3 mmol/L (ref 3.5–5.1)
Sodium: 131 mmol/L — ABNORMAL LOW (ref 135–145)
Total Bilirubin: 2.2 mg/dL — ABNORMAL HIGH (ref 0.3–1.2)
Total Protein: 8 g/dL (ref 6.5–8.1)

## 2019-08-07 LAB — SARS CORONAVIRUS 2 BY RT PCR (HOSPITAL ORDER, PERFORMED IN ~~LOC~~ HOSPITAL LAB): SARS Coronavirus 2: NEGATIVE

## 2019-08-07 LAB — MAGNESIUM: Magnesium: 3.6 mg/dL — ABNORMAL HIGH (ref 1.7–2.4)

## 2019-08-07 LAB — TROPONIN I (HIGH SENSITIVITY)
Troponin I (High Sensitivity): 20 ng/L — ABNORMAL HIGH (ref <18)
Troponin I (High Sensitivity): 30 ng/L — ABNORMAL HIGH (ref <18)

## 2019-08-07 LAB — MRSA PCR SCREENING: MRSA by PCR: NEGATIVE

## 2019-08-07 MED ORDER — INSULIN REGULAR(HUMAN) IN NACL 100-0.9 UT/100ML-% IV SOLN
INTRAVENOUS | Status: DC
  Administered 2019-08-07: 1.2 [IU]/h via INTRAVENOUS
  Filled 2019-08-07: qty 100

## 2019-08-07 MED ORDER — PANTOPRAZOLE SODIUM 40 MG PO TBEC
40.0000 mg | DELAYED_RELEASE_TABLET | Freq: Every day | ORAL | Status: DC
  Administered 2019-08-08: 40 mg via ORAL
  Filled 2019-08-07: qty 1

## 2019-08-07 MED ORDER — SODIUM CHLORIDE 0.9 % IV SOLN: INTRAVENOUS | Status: DC

## 2019-08-07 MED ORDER — INSULIN ASPART 100 UNIT/ML ~~LOC~~ SOLN
9.0000 [IU] | Freq: Once | SUBCUTANEOUS | Status: AC
  Administered 2019-08-07: 9 [IU] via SUBCUTANEOUS
  Filled 2019-08-07: qty 1

## 2019-08-07 MED ORDER — INSULIN REGULAR(HUMAN) IN NACL 100-0.9 UT/100ML-% IV SOLN
INTRAVENOUS | Status: DC
  Administered 2019-08-07: 5.4 [IU]/h via INTRAVENOUS
  Filled 2019-08-07: qty 100

## 2019-08-07 MED ORDER — HEPARIN SODIUM (PORCINE) 5000 UNIT/ML IJ SOLN
5000.0000 [IU] | Freq: Three times a day (TID) | INTRAMUSCULAR | Status: DC
  Administered 2019-08-07 – 2019-08-08 (×4): 5000 [IU] via SUBCUTANEOUS
  Filled 2019-08-07 (×4): qty 1

## 2019-08-07 MED ORDER — DEXTROSE-NACL 5-0.45 % IV SOLN: INTRAVENOUS | Status: DC

## 2019-08-07 MED ORDER — STERILE WATER FOR INJECTION IV SOLN
INTRAVENOUS | Status: DC
  Administered 2019-08-07: 01:00:00 via INTRAVENOUS
  Filled 2019-08-07 (×5): qty 850

## 2019-08-07 MED ORDER — DEXTROSE-NACL 5-0.45 % IV SOLN
INTRAVENOUS | Status: DC
  Administered 2019-08-07: 08:00:00 via INTRAVENOUS

## 2019-08-07 MED ORDER — SODIUM CHLORIDE 0.9 % IV SOLN
INTRAVENOUS | Status: DC
  Administered 2019-08-07: 03:00:00 via INTRAVENOUS

## 2019-08-07 MED ORDER — SODIUM CHLORIDE 0.9 % IV SOLN: INTRAVENOUS | Status: AC

## 2019-08-07 MED ORDER — CHLORHEXIDINE GLUCONATE CLOTH 2 % EX PADS: 6.0000 | MEDICATED_PAD | Freq: Every day | CUTANEOUS | Status: DC

## 2019-08-07 MED ORDER — INSULIN ASPART 100 UNIT/ML ~~LOC~~ SOLN: 0.0000 [IU] | Freq: Every day | SUBCUTANEOUS | Status: DC

## 2019-08-07 MED ORDER — INSULIN ASPART 100 UNIT/ML ~~LOC~~ SOLN
0.0000 [IU] | Freq: Three times a day (TID) | SUBCUTANEOUS | Status: DC
  Administered 2019-08-07 – 2019-08-08 (×2): 2 [IU] via SUBCUTANEOUS
  Filled 2019-08-07 (×3): qty 1

## 2019-08-07 MED ORDER — INSULIN GLARGINE 100 UNIT/ML ~~LOC~~ SOLN
15.0000 [IU] | Freq: Every day | SUBCUTANEOUS | Status: DC
  Administered 2019-08-07 – 2019-08-08 (×2): 15 [IU] via SUBCUTANEOUS
  Filled 2019-08-07 (×3): qty 0.15

## 2019-08-07 NOTE — Progress Notes (Signed)
Patient transferred to 254 from icu via wheelchair.  Patient oriented to room, call bell in reach.

## 2019-08-07 NOTE — Progress Notes (Signed)
Inpatient Diabetes Program Recommendations  AACE/ADA: New Consensus Statement on Inpatient Glycemic Control (2015)  Target Ranges:  Prepandial:   less than 140 mg/dL      Peak postprandial:   less than 180 mg/dL (1-2 hours)      Critically ill patients:  140 - 180 mg/dL   Lab Results  Component Value Date   GLUCAP 131 (H) 08/07/2019   HGBA1C 13.8 (H) 05/30/2019    Review of Glycemic Control  Diabetes history: DM Outpatient Diabetes medications: Lantus 40 units daily + Novolog 0-30 units tid meal coverage Current orders for Inpatient glycemic control: IV insulin  Inpatient Diabetes Program Recommendations:   Patient was just discharged on  07/29/19 Noted CO2 16 this am & anion gap 24. When patient meets criteria to transition to subcutaneous insulin, Please give basal insulin 2 hrs prior to IV insulin discontinued and cover CBG with Novolog correction @ time IV insulin stopped. Patient has had multiple ED visits. Will follow during hospitalization.  Thank you, Nani Gasser. Aida Lemaire, RN, MSN, CDE  Diabetes Coordinator Inpatient Glycemic Control Team Team Pager [phone] (8am-5pm) 08/07/2019 10:53 AM
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
## 2019-08-07 NOTE — Progress Notes (Signed)
[place] Progress Note Patient Name: Spencer [name] DOB: [birth year] MRN: [id number]   Date of Service  08/07/2019  HPI/Events of Note  PT admitted with severe volume depletion and metabolic acidosis due to DKA.  eICU Interventions  New Patient Evaluation completed.        Spencer [name] 08/07/2019, 3:09 AM
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
## 2019-08-07 NOTE — Progress Notes (Signed)
Pt transferred to RM # [id number] at this time. VSS prior to transfer. Pt transferred in wheelchair. Report given to Leonore, Therapist, sports.
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
## 2019-08-08 LAB — BASIC METABOLIC PANEL
Anion gap: 8 (ref 5–15)
BUN: 30 mg/dL — ABNORMAL HIGH (ref 6–20)
CO2: 25 mmol/L (ref 22–32)
Calcium: 8.8 mg/dL — ABNORMAL LOW (ref 8.9–10.3)
Chloride: 99 mmol/L (ref 98–111)
Creatinine, Ser: 0.93 mg/dL (ref 0.61–1.24)
GFR calc Af Amer: >60 mL/min (ref >60)
GFR calc non Af Amer: >60 mL/min (ref >60)
Glucose, Bld: 320 mg/dL — ABNORMAL HIGH (ref 70–99)
Potassium: 3.8 mmol/L (ref 3.5–5.1)
Sodium: 132 mmol/L — ABNORMAL LOW (ref 135–145)

## 2019-08-08 LAB — GLUCOSE, CAPILLARY
Glucose-Capillary: 160 mg/dL — ABNORMAL HIGH (ref 70–99)
Glucose-Capillary: 280 mg/dL — ABNORMAL HIGH (ref 70–99)
Glucose-Capillary: 320 mg/dL — ABNORMAL HIGH (ref 70–99)
Glucose-Capillary: 332 mg/dL — ABNORMAL HIGH (ref 70–99)
Glucose-Capillary: 403 mg/dL — ABNORMAL HIGH (ref 70–99)

## 2019-08-08 LAB — CBC
HCT: 33 % — ABNORMAL LOW (ref 39.0–52.0)
Hemoglobin: 11.3 g/dL — ABNORMAL LOW (ref 13.0–17.0)
MCH: 32.2 pg (ref 26.0–34.0)
MCHC: 34.2 g/dL (ref 30.0–36.0)
MCV: 94 fL (ref 80.0–100.0)
Platelets: 343 10*3/uL (ref 150–400)
RBC: 3.51 MIL/uL — ABNORMAL LOW (ref 4.22–5.81)
RDW: 13.1 % (ref 11.5–15.5)
WBC: 11.2 10*3/uL — ABNORMAL HIGH (ref 4.0–10.5)
nRBC: 0 % (ref 0.0–0.2)

## 2019-08-08 MED ORDER — INSULIN ASPART 100 UNIT/ML ~~LOC~~ SOLN: 10.0000 [IU] | Freq: Once | SUBCUTANEOUS | Status: DC

## 2019-08-08 MED ORDER — INSULIN GLARGINE 100 UNIT/ML ~~LOC~~ SOLN: 50.0000 [IU] | Freq: Every day | SUBCUTANEOUS | 11 refills | Status: DC

## 2019-08-08 MED ORDER — INSULIN ASPART 100 UNIT/ML ~~LOC~~ SOLN
15.0000 [IU] | Freq: Once | SUBCUTANEOUS | Status: AC
  Administered 2019-08-08: 15 [IU] via SUBCUTANEOUS

## 2019-08-08 MED ORDER — INSULIN ASPART 100 UNIT/ML ~~LOC~~ SOLN: 5.0000 [IU] | Freq: Three times a day (TID) | SUBCUTANEOUS | Status: DC

## 2019-08-08 MED ORDER — INSULIN GLARGINE 100 UNIT/ML ~~LOC~~ SOLN
10.0000 [IU] | SUBCUTANEOUS | Status: AC
  Administered 2019-08-08: 10 [IU] via SUBCUTANEOUS
  Filled 2019-08-08: qty 0.1

## 2019-08-08 MED ORDER — INSULIN ASPART 100 UNIT/ML ~~LOC~~ SOLN: 9.0000 [IU] | Freq: Once | SUBCUTANEOUS | Status: DC

## 2019-08-08 MED ORDER — INSULIN ASPART 100 UNIT/ML ~~LOC~~ SOLN
5.0000 [IU] | Freq: Three times a day (TID) | SUBCUTANEOUS | Status: DC
  Administered 2019-08-08 (×2): 5 [IU] via SUBCUTANEOUS
  Filled 2019-08-08: qty 1

## 2019-08-08 MED ORDER — INSULIN ASPART 100 UNIT/ML ~~LOC~~ SOLN
SUBCUTANEOUS | Status: AC
  Filled 2019-08-08: qty 1

## 2019-08-08 NOTE — Progress Notes (Addendum)
Inpatient Diabetes Program Recommendations  AACE/ADA: New Consensus Statement on Inpatient Glycemic Control (2015)  Target Ranges:  Prepandial:   less than 140 mg/dL      Peak postprandial:   less than 180 mg/dL (1-2 hours)      Critically ill patients:  140 - 180 mg/dL   Results for Spencer [name], Spencer [name] (MRN [id number]) as of 08/08/2019 07:25  Ref. Range 08/07/2019 11:40 08/07/2019 12:56 08/07/2019 14:07 08/07/2019 15:56 08/07/2019 21:34  Glucose-Capillary Latest Ref Range: 70 - 99 mg/dL 120 (H)  IV Insulin Drip 134 (H)  IV Insulin Drip +  15 units LANTUS given 155 (H)  IV Insulin Drip 177 (H)  IV Insulin Drip OFF 455 (H)  9 units NOVOLOG    Results for Spencer [name], Spencer [name] (MRN [id number]) as of 08/08/2019 07:25  Ref. Range 08/08/2019 00:07 08/08/2019 03:22 08/08/2019 05:13  Glucose-Capillary Latest Ref Range: 70 - 99 mg/dL 332 (H) 320 (H) 280 (H)   Results for Spencer [name], Spencer [name] (MRN [id number]) as of 08/08/2019 07:25  Ref. Range 05/30/2019 04:46  Hemoglobin A1C Latest Ref Range: 4.8 - 5.6 % 13.8 (H)  (349 mg/dl)    Admit with: Severe DKA  History: Type 1 DM  Home DM Meds: Lantus 40 units Daily       Novolog 0-30 units TID  Current Orders: Lantus 15 units Daily      Novolog Sensitive Correction Scale/ SSI (0-9 units) TID AC + HS     This is pt's 8th admission since January for DKA.  Counseled by the DM Coordinator in April of this year and again in July during admission.      MD- Please consider the following in-hospital insulin adjustments:   1. Increase Lantus to 25 units Daily (~70% total home dose)--If 15 unit dose already given this AM, please place order for Lantus 10 units X 1 dose to be given as well to make a total of 25 units   2. Start Novolog Meal Coverage: Novolog 5 units TID with meals  (Please add the following Hold Parameters: Hold if pt eats <50% of meal, Hold if pt NPO)     Addendum 11:45am- Met w/ pt today about current admission.  Discussed with patient  diagnosis of DKA (pathophysiology), treatment of DKA, lab results, and transition plan to SQ insulin regimen.  Pt stated he doesn't know why his sugar went so high.  Discussed with pt the extreme danger of having a glucose over 1000 mg/dl.  Patient stated he has all insulins and supplies at home.  Has CBG meter at home as well.  No questions about giving insulin, however, I have concerns that pt likely not properly dosing his Novolog.  Stated to me he checks his CBGs and then estimates his Novolog doses based on his CBG and his food intake.  He has no definitive guidelines to follow at home.  Stated to me he used to see Dr. Graceann Congress at [facility] but that she left and he hasn't seen an ENDO since.  Stated to me "No one will give me a referral to the ENDO--They keep saying they will refer me and then they don't!".  Told pt I would contact Dr. Manuella Ghazi (attending MD) and would ask about the ENDO referral to [facility].         --Will follow patient during hospitalization--  Wyn Quaker RN, MSN, CDE Diabetes Coordinator Inpatient Glycemic Control Team Team Pager: [phone] (8a-5p)

## 2019-08-08 NOTE — Plan of Care (Signed)
  Problem: Clinical Measurements: Goal: Will remain free from infection Outcome: Progressing Goal: Diagnostic test results will improve Outcome: Progressing Goal: Cardiovascular complication will be avoided Outcome: Progressing   

## 2019-08-08 NOTE — Discharge Instructions (Signed)
Blood Glucose Monitoring, Adult °Monitoring your blood sugar (glucose) is an important part of managing your diabetes (diabetes mellitus). Blood glucose monitoring involves checking your blood glucose as often as directed and keeping a record (log) of your results over time. °Checking your blood glucose regularly and keeping a blood glucose log can: °· Help you and your health care provider adjust your diabetes management plan as needed, including your medicines or insulin. °· Help you understand how food, exercise, illnesses, and medicines affect your blood glucose. °· Let you know what your blood glucose is at any time. You can quickly find out if you have low blood glucose (hypoglycemia) or high blood glucose (hyperglycemia). °Your health care provider will set individualized treatment goals for you. Your goals will be based on your age, other medical conditions you have, and how you respond to diabetes treatment. Generally, the goal of treatment is to maintain the following blood glucose levels: °· Before meals (preprandial): 80-130 mg/dL (4.4-7.2 mmol/L). °· After meals (postprandial): below 180 mg/dL (10 mmol/L). °· A1c level: less than 7%. °Supplies needed: °· Blood glucose meter. °· Test strips for your meter. Each meter has its own strips. You must use the strips that came with your meter. °· A needle to prick your finger (lancet). Do not use a lancet more than one time. °· A device that holds the lancet (lancing device). °· A journal or log book to write down your results. °How to check your blood glucose ° °1. Wash your hands with soap and water. °2. Prick the side of your finger (not the tip) with the lancet. Use a different finger each time. °3. Gently rub the finger until a small drop of blood appears. °4. Follow instructions that come with your meter for inserting the test strip, applying blood to the strip, and using your blood glucose meter. °5. Write down your result and any notes. °Some meters  allow you to use areas of your body other than your finger (alternative sites) to test your blood. The most common alternative sites are: °· Forearm. °· Thigh. °· Palm of the hand. °If you think you may have hypoglycemia, or if you have a history of not knowing when your blood glucose is getting low (hypoglycemia unawareness), do not use alternative sites. Use your finger instead. Alternative sites may not be as accurate as the fingers, because blood flow is slower in these areas. This means that the result you get may be delayed, and it may be different from the result that you would get from your finger. °Follow these instructions at home: °Blood glucose log ° °· Every time you check your blood glucose, write down your result. Also write down any notes about things that may be affecting your blood glucose, such as your diet and exercise for the day. This information can help you and your health care provider: °? Look for patterns in your blood glucose over time. °? Adjust your diabetes management plan as needed. °· Check if your meter allows you to download your records to a computer. Most glucose meters store a record of glucose readings in the meter. °If you have type 1 diabetes: °· Check your blood glucose 2 or more times a day. °· Also check your blood glucose: °? Before every insulin injection. °? Before and after exercise. °? Before meals. °? 2 hours after a meal. °? Occasionally between 2:00 a.m. and 3:00 a.m., as directed. °? Before potentially dangerous tasks, like driving or using heavy machinery. °?   At bedtime. °· You may need to check your blood glucose more often, up to 6-10 times a day, if you: °? Use an insulin pump. °? Need multiple daily injections (MDI). °? Have diabetes that is not well-controlled. °? Are ill. °? Have a history of severe hypoglycemia. °? Have hypoglycemia unawareness. °If you have type 2 diabetes: °· If you take insulin or other diabetes medicines, check your blood glucose 2 or  more times a day. °· If you are on intensive insulin therapy, check your blood glucose 4 or more times a day. Occasionally, you may also need to check between 2:00 a.m. and 3:00 a.m., as directed. °· Also check your blood glucose: °? Before and after exercise. °? Before potentially dangerous tasks, like driving or using heavy machinery. °· You may need to check your blood glucose more often if: °? Your medicine is being adjusted. °? Your diabetes is not well-controlled. °? You are ill. °General tips °· Always keep your supplies with you. °· If you have questions or need help, all blood glucose meters have a 24-hour "hotline" phone number that you can call. You may also contact your health care provider. °· After you use a few boxes of test strips, adjust (calibrate) your blood glucose meter by following instructions that came with your meter. °Contact a health care provider if: °· Your blood glucose is at or above 240 mg/dL (13.3 mmol/L) for 2 days in a row. °· You have been sick or have had a fever for 2 days or longer, and you are not getting better. °· You have any of the following problems for more than 6 hours: °? You cannot eat or drink. °? You have nausea or vomiting. °? You have diarrhea. °Get help right away if: °· Your blood glucose is lower than 54 mg/dL (3 mmol/L). °· You become confused or you have trouble thinking clearly. °· You have difficulty breathing. °· You have moderate or large ketone levels in your urine. °Summary °· Monitoring your blood sugar (glucose) is an important part of managing your diabetes (diabetes mellitus). °· Blood glucose monitoring involves checking your blood glucose as often as directed and keeping a record (log) of your results over time. °· Your health care provider will set individualized treatment goals for you. Your goals will be based on your age, other medical conditions you have, and how you respond to diabetes treatment. °· Every time you check your blood glucose,  write down your result. Also write down any notes about things that may be affecting your blood glucose, such as your diet and exercise for the day. °This information is not intended to replace advice given to you by your health care provider. Make sure you discuss any questions you have with your health care provider. °Document Released: 10/26/2003 Document Revised: 08/16/2018 Document Reviewed: 04/03/2016 °[employer] Patient Education © 2020 [employer] ° °
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
## 2019-08-08 NOTE — Progress Notes (Signed)
9 units of novolog not given at [place]. Glucose level at 0322 320 and 280 at 0513. Dr Marcille Blanco made aware, no new orders. Pt asymptomatic

## 2019-08-08 NOTE — Progress Notes (Signed)
Patient discharged to home. Tele and iv d/c'd. Patient verbalizes understanding of discharge instructions.

## 2019-08-08 NOTE — Progress Notes (Signed)
Dr. Manuella Ghazi--  Patient requesting referral to the [facility] for follow up care after discharge.     --Will follow patient during hospitalization--  Wyn Quaker RN, MSN, CDE Diabetes Coordinator Inpatient Glycemic Control Team Team Pager: [phone] (8a-5p)

## 2019-08-08 NOTE — Discharge Summary (Signed)
[facility] NAME: Spencer [name]    MR#:  [id number]  DATE OF BIRTH:  Jun 01, 1961  DATE OF ADMISSION:  08/06/2019   ADMITTING PHYSICIAN: Lance Coon, MD  DATE OF DISCHARGE: 08/08/2019  2:56 PM  PRIMARY CARE PHYSICIAN: Jodi Marble, MD   ADMISSION DIAGNOSIS:  Hyperkalemia [E87.5] V-tach (HCC) [I47.2] AKI (acute kidney injury) ([place]) [N17.9] Atrial fibrillation with RVR ([place]) [I48.91] Altered mental status, unspecified altered mental status type [R41.82] Diabetic ketoacidosis without coma associated with type 2 diabetes mellitus ([place]) [E11.10] DISCHARGE DIAGNOSIS:  Principal Problem:   DKA (diabetic ketoacidoses) ([place]) Active Problems:   AKI (acute kidney injury) ([place])   Hyperkalemia   Atrial fibrillation with RVR (HCC)   GERD (gastroesophageal reflux disease)  SECONDARY DIAGNOSIS:   Past Medical History:  Diagnosis Date  . Brain aneurysm   . Broken bones    clavical, ankle, arms toes wriast   . Diabetes mellitus without complication ([place])   . Dysrhythmia   . GERD (gastroesophageal reflux disease)   . Hypertension   . Substance abuse [facility])    HOSPITAL COURSE:  * DKA (diabetic ketoacidoses) ([place]) -resolved  *AKI (acute kidney injury) ([place]) -resolved with hydration  *Hyperkalemia -resolved  *Atrial fibrillation with RVR ([place]) -converted to sinus rhythm. Patient not interested in starting blood thinner at this time. Rate controlled now  *GERD (gastroesophageal reflux disease) -home dose PPI  Suspect medication noncompliance big issue  DISCHARGE CONDITIONS:  stable CONSULTS OBTAINED:   DRUG ALLERGIES:  No Known Allergies DISCHARGE MEDICATIONS:   Allergies as of 08/08/2019   No Known Allergies     Medication List    TAKE these medications   blood glucose meter kit and supplies Check blood sugar 4 times daily   insulin glargine 100 UNIT/ML injection Commonly known as: LANTUS Inject 0.5 mLs  (50 Units total) into the skin daily. What changed: how much to take   Insulin Pen Needle 31G X 5 MM Misc Check blood sugars 4 times daily   multivitamin with minerals Tabs tablet Take 1 tablet by mouth daily.   NovoLOG FlexPen 100 UNIT/ML FlexPen Generic drug: insulin aspart Inject 0-30 Units into the skin 3 (three) times daily.   pantoprazole 40 MG tablet Commonly known as: PROTONIX Take 1 tablet (40 mg total) by mouth daily.        DISCHARGE INSTRUCTIONS:   DIET:  Cardiac diet DISCHARGE CONDITION:  Stable ACTIVITY:  Activity as tolerated OXYGEN:  Home Oxygen: No.  Oxygen Delivery: room air DISCHARGE LOCATION:  home   If you experience worsening of your admission symptoms, develop shortness of breath, life threatening emergency, suicidal or homicidal thoughts you must seek medical attention immediately by calling 911 or calling your MD immediately  if symptoms less severe.  You Must read complete instructions/literature along with all the possible adverse reactions/side effects for all the Medicines you take and that have been prescribed to you. Take any new Medicines after you have completely understood and accpet all the possible adverse reactions/side effects.   Please note  You were cared for by a hospitalist during your hospital stay. If you have any questions about your discharge medications or the care you received while you were in the hospital after you are discharged, you can call the unit and asked to speak with the hospitalist on call if the hospitalist that took care of you is not available. Once you are discharged, your primary care physician will  handle any further medical issues. Please note that NO REFILLS for any discharge medications will be authorized once you are discharged, as it is imperative that you return to your primary care physician (or establish a relationship with a primary care physician if you do not have one) for your aftercare needs so  that they can reassess your need for medications and monitor your lab values.    On the day of Discharge:  VITAL SIGNS:  Blood pressure 132/72, pulse 74, temperature 98.3 F (36.8 C), resp. rate 19, height 6' (1.829 m), weight 64.5 kg, SpO2 99 %. PHYSICAL EXAMINATION:  GENERAL:  58 y.o.-year-old patient lying in the bed with no acute distress.  EYES: Pupils equal, round, reactive to light and accommodation. No scleral icterus. Extraocular muscles intact.  HEENT: Head atraumatic, normocephalic. Oropharynx and nasopharynx clear.  NECK:  Supple, no jugular venous distention. No thyroid enlargement, no tenderness.  LUNGS: Normal breath sounds bilaterally, no wheezing, rales,rhonchi or crepitation. No use of accessory muscles of respiration.  CARDIOVASCULAR: S1, S2 normal. No murmurs, rubs, or gallops.  ABDOMEN: Soft, non-tender, non-distended. Bowel sounds present. No organomegaly or mass.  EXTREMITIES: No pedal edema, cyanosis, or clubbing.  NEUROLOGIC: Cranial nerves II through XII are intact. Muscle strength 5/5 in all extremities. Sensation intact. Gait not checked.  PSYCHIATRIC: The patient is alert and oriented x 3.  SKIN: No obvious rash, lesion, or ulcer.  DATA REVIEW:   CBC Recent Labs  Lab 08/08/19 0557  WBC 11.2*  HGB 11.3*  HCT 33.0*  PLT 343    Chemistries  Recent Labs  Lab 08/06/19 2252  08/08/19 0557  NA 131*   < > 132*  K 7.3*   < > 3.8  CL 88*   < > 99  CO2 <7*   < > 25  GLUCOSE 1,126*   < > 320*  BUN 52*   < > 30*  CREATININE 2.95*   < > 0.93  CALCIUM 9.5   < > 8.8*  MG 3.6*  --   --   AST 18  --   --   ALT 16  --   --   ALKPHOS 156*  --   --   BILITOT 2.2*  --   --    < > = values in this interval not displayed.     Follow-up Information    Jodi Marble, MD. Schedule an appointment as soon as possible for a visit in 5 days.   Specialty: Internal Medicine Contact information: [place] [address] [phone]         Judi Cong, MD. Schedule an appointment as soon as possible for a visit in 1 week.   Specialty: Endocrinology Contact information: [address] [place] [address] [phone]           Management plans discussed with the patient, family and they are in agreement.  CODE STATUS: Prior   TOTAL TIME TAKING CARE OF THIS PATIENT: 45 minutes.    Max Sane M.D on 08/08/2019 at 7:12 PM  Between 7am to 6pm - Pager - [phone]  After 6pm go to [email or web address] - Proofreader  [facility]  Office  [phone]  CC: Primary care physician; Jodi Marble, MD   Note: This dictation was prepared with Dragon dictation along with smaller phrase technology. Any transcriptional errors that result from this process are unintentional.

## 2019-08-19 ENCOUNTER — Emergency Department: Payer: Medicaid Other

## 2019-08-19 ENCOUNTER — Inpatient Hospital Stay
Admission: EM | Admit: 2019-08-19 | Discharge: 2019-08-21 | DRG: 639 | Disposition: A | Discharge status: Home / Self Care | Payer: Medicaid Other | Attending: Internal Medicine | Admitting: Internal Medicine

## 2019-08-19 ENCOUNTER — Other Ambulatory Visit: Payer: Self-pay

## 2019-08-19 DIAGNOSIS — E162 Hypoglycemia, unspecified: Secondary | ICD-10-CM

## 2019-08-19 DIAGNOSIS — K219 Gastro-esophageal reflux disease without esophagitis: Secondary | ICD-10-CM | POA: Diagnosis present

## 2019-08-19 DIAGNOSIS — I10 Essential (primary) hypertension: Secondary | ICD-10-CM | POA: Diagnosis present

## 2019-08-19 DIAGNOSIS — F17210 Nicotine dependence, cigarettes, uncomplicated: Secondary | ICD-10-CM | POA: Diagnosis present

## 2019-08-19 DIAGNOSIS — E1140 Type 2 diabetes mellitus with diabetic neuropathy, unspecified: Secondary | ICD-10-CM | POA: Diagnosis present

## 2019-08-19 DIAGNOSIS — Z20828 Contact with and (suspected) exposure to other viral communicable diseases: Secondary | ICD-10-CM | POA: Diagnosis present

## 2019-08-19 DIAGNOSIS — I160 Hypertensive urgency: Secondary | ICD-10-CM | POA: Diagnosis present

## 2019-08-19 DIAGNOSIS — Z79899 Other long term (current) drug therapy: Secondary | ICD-10-CM

## 2019-08-19 DIAGNOSIS — Z8249 Family history of ischemic heart disease and other diseases of the circulatory system: Secondary | ICD-10-CM

## 2019-08-19 DIAGNOSIS — I480 Paroxysmal atrial fibrillation: Secondary | ICD-10-CM | POA: Diagnosis present

## 2019-08-19 DIAGNOSIS — Z794 Long term (current) use of insulin: Secondary | ICD-10-CM

## 2019-08-19 DIAGNOSIS — E11649 Type 2 diabetes mellitus with hypoglycemia without coma: Principal | ICD-10-CM | POA: Diagnosis present

## 2019-08-19 LAB — CBC WITH DIFFERENTIAL/PLATELET
Abs Immature Granulocytes: 0.05 10*3/uL (ref 0.00–0.07)
Basophils Absolute: 0.1 10*3/uL (ref 0.0–0.1)
Basophils Relative: 1 %
Eosinophils Absolute: 0.1 10*3/uL (ref 0.0–0.5)
Eosinophils Relative: 1 %
HCT: 36.1 % — ABNORMAL LOW (ref 39.0–52.0)
Hemoglobin: 11.8 g/dL — ABNORMAL LOW (ref 13.0–17.0)
Immature Granulocytes: 1 %
Lymphocytes Relative: 8 %
Lymphs Abs: 0.8 10*3/uL (ref 0.7–4.0)
MCH: 31.8 pg (ref 26.0–34.0)
MCHC: 32.7 g/dL (ref 30.0–36.0)
MCV: 97.3 fL (ref 80.0–100.0)
Monocytes Absolute: 0.6 10*3/uL (ref 0.1–1.0)
Monocytes Relative: 6 %
Neutro Abs: 8.3 10*3/uL — ABNORMAL HIGH (ref 1.7–7.7)
Neutrophils Relative %: 83 %
Platelets: 354 10*3/uL (ref 150–400)
RBC: 3.71 MIL/uL — ABNORMAL LOW (ref 4.22–5.81)
RDW: 13.7 % (ref 11.5–15.5)
WBC: 9.9 10*3/uL (ref 4.0–10.5)
nRBC: 0 % (ref 0.0–0.2)

## 2019-08-19 LAB — URINALYSIS, ROUTINE W REFLEX MICROSCOPIC
Bacteria, UA: NONE SEEN
Bilirubin Urine: NEGATIVE
Glucose, UA: >=500 mg/dL — AB
Hgb urine dipstick: NEGATIVE
Ketones, ur: NEGATIVE mg/dL
Leukocytes,Ua: NEGATIVE
Nitrite: NEGATIVE
Protein, ur: NEGATIVE mg/dL
Specific Gravity, Urine: 1.016 (ref 1.005–1.030)
Squamous Epithelial / HPF: NONE SEEN (ref 0–5)
pH: 6 (ref 5.0–8.0)

## 2019-08-19 LAB — COMPREHENSIVE METABOLIC PANEL
ALT: 17 U/L (ref 0–44)
AST: 31 U/L (ref 15–41)
Albumin: 4 g/dL (ref 3.5–5.0)
Alkaline Phosphatase: 91 U/L (ref 38–126)
Anion gap: 13 (ref 5–15)
BUN: 25 mg/dL — ABNORMAL HIGH (ref 6–20)
CO2: 23 mmol/L (ref 22–32)
Calcium: 9.6 mg/dL (ref 8.9–10.3)
Chloride: 106 mmol/L (ref 98–111)
Creatinine, Ser: 0.86 mg/dL (ref 0.61–1.24)
GFR calc Af Amer: >60 mL/min (ref >60)
GFR calc non Af Amer: >60 mL/min (ref >60)
Glucose, Bld: 112 mg/dL — ABNORMAL HIGH (ref 70–99)
Potassium: 4 mmol/L (ref 3.5–5.1)
Sodium: 142 mmol/L (ref 135–145)
Total Bilirubin: 0.9 mg/dL (ref 0.3–1.2)
Total Protein: 6.9 g/dL (ref 6.5–8.1)

## 2019-08-19 LAB — ETHANOL: Alcohol, Ethyl (B): <10 mg/dL (ref <10)

## 2019-08-19 LAB — HEMOGLOBIN A1C
Hgb A1c MFr Bld: 11.8 % — ABNORMAL HIGH (ref 4.8–5.6)
Mean Plasma Glucose: 291.96 mg/dL

## 2019-08-19 LAB — URINE DRUG SCREEN, QUALITATIVE (ARMC ONLY)
Amphetamines, Ur Screen: POSITIVE — AB
Barbiturates, Ur Screen: NOT DETECTED
Benzodiazepine, Ur Scrn: NOT DETECTED
Cannabinoid 50 Ng, Ur ~~LOC~~: NOT DETECTED
Cocaine Metabolite,Ur ~~LOC~~: POSITIVE — AB
MDMA (Ecstasy)Ur Screen: NOT DETECTED
Methadone Scn, Ur: NOT DETECTED
Opiate, Ur Screen: NOT DETECTED
Phencyclidine (PCP) Ur S: NOT DETECTED
Tricyclic, Ur Screen: NOT DETECTED

## 2019-08-19 LAB — ACETAMINOPHEN LEVEL: Acetaminophen (Tylenol), Serum: <10 ug/mL — ABNORMAL LOW (ref 10–30)

## 2019-08-19 LAB — GLUCOSE, CAPILLARY
Glucose-Capillary: 24 mg/dL — CL (ref 70–99)
Glucose-Capillary: 33 mg/dL — CL (ref 70–99)
Glucose-Capillary: 65 mg/dL — ABNORMAL LOW (ref 70–99)
Glucose-Capillary: 113 mg/dL — ABNORMAL HIGH (ref 70–99)
Glucose-Capillary: 111 mg/dL — ABNORMAL HIGH (ref 70–99)
Glucose-Capillary: 376 mg/dL — ABNORMAL HIGH (ref 70–99)
Glucose-Capillary: 334 mg/dL — ABNORMAL HIGH (ref 70–99)
Glucose-Capillary: 157 mg/dL — ABNORMAL HIGH (ref 70–99)
Glucose-Capillary: 319 mg/dL — ABNORMAL HIGH (ref 70–99)

## 2019-08-19 LAB — SARS CORONAVIRUS 2 (TAT 6-24 HRS): SARS Coronavirus 2: NEGATIVE

## 2019-08-19 LAB — SALICYLATE LEVEL: Salicylate Lvl: <7 mg/dL (ref 2.8–30.0)

## 2019-08-19 MED ORDER — DEXTROSE 50 % IV SOLN
1.0000 | Freq: Once | INTRAVENOUS | Status: AC
  Administered 2019-08-19: 50 mL via INTRAVENOUS
  Filled 2019-08-19: qty 50

## 2019-08-19 MED ORDER — AMLODIPINE BESYLATE 10 MG PO TABS
10.0000 mg | ORAL_TABLET | Freq: Every day | ORAL | Status: DC
  Administered 2019-08-19 – 2019-08-21 (×3): 10 mg via ORAL
  Filled 2019-08-19 (×3): qty 1

## 2019-08-19 MED ORDER — ONDANSETRON HCL 4 MG PO TABS
4.0000 mg | ORAL_TABLET | Freq: Four times a day (QID) | ORAL | Status: DC | PRN
  Filled 2019-08-19: qty 1

## 2019-08-19 MED ORDER — INSULIN ASPART 100 UNIT/ML ~~LOC~~ SOLN
0.0000 [IU] | Freq: Three times a day (TID) | SUBCUTANEOUS | Status: DC
  Administered 2019-08-19: 2 [IU] via SUBCUTANEOUS
  Administered 2019-08-19: 7 [IU] via SUBCUTANEOUS
  Filled 2019-08-19 (×2): qty 1

## 2019-08-19 MED ORDER — INSULIN ASPART 100 UNIT/ML ~~LOC~~ SOLN
0.0000 [IU] | Freq: Every day | SUBCUTANEOUS | Status: DC
  Administered 2019-08-19: 4 [IU] via SUBCUTANEOUS
  Filled 2019-08-19: qty 1

## 2019-08-19 MED ORDER — DEXTROSE 50 % IV SOLN
25.0000 mL | Freq: Once | INTRAVENOUS | Status: AC
  Administered 2019-08-19: 25 mL via INTRAVENOUS
  Filled 2019-08-19: qty 50

## 2019-08-19 MED ORDER — ONDANSETRON HCL 4 MG/2ML IJ SOLN: 4.0000 mg | Freq: Four times a day (QID) | INTRAMUSCULAR | Status: DC | PRN

## 2019-08-19 MED ORDER — TRAZODONE HCL 50 MG PO TABS
25.0000 mg | ORAL_TABLET | Freq: Every evening | ORAL | Status: DC | PRN
  Filled 2019-08-19: qty 0.5

## 2019-08-19 MED ORDER — PANTOPRAZOLE SODIUM 40 MG PO TBEC
40.0000 mg | DELAYED_RELEASE_TABLET | Freq: Every day | ORAL | Status: DC
  Administered 2019-08-19 – 2019-08-21 (×3): 40 mg via ORAL
  Filled 2019-08-19 (×3): qty 1

## 2019-08-19 MED ORDER — MAGNESIUM HYDROXIDE 400 MG/5ML PO SUSP
30.0000 mL | Freq: Every day | ORAL | Status: DC | PRN
  Filled 2019-08-19: qty 30

## 2019-08-19 MED ORDER — ENOXAPARIN SODIUM 40 MG/0.4ML ~~LOC~~ SOLN
40.0000 mg | SUBCUTANEOUS | Status: DC
  Administered 2019-08-20 – 2019-08-21 (×2): 40 mg via SUBCUTANEOUS
  Filled 2019-08-19 (×2): qty 0.4

## 2019-08-19 MED ORDER — ADULT MULTIVITAMIN W/MINERALS CH
1.0000 | ORAL_TABLET | Freq: Every day | ORAL | Status: DC
  Administered 2019-08-19 – 2019-08-21 (×3): 1 via ORAL
  Filled 2019-08-19 (×4): qty 1

## 2019-08-19 MED ORDER — DEXTROSE 10 % IV SOLN
INTRAVENOUS | Status: DC
  Administered 2019-08-19: 08:00:00 via INTRAVENOUS

## 2019-08-19 MED ORDER — INSULIN GLARGINE 100 UNIT/ML ~~LOC~~ SOLN
30.0000 [IU] | Freq: Every day | SUBCUTANEOUS | Status: DC
  Administered 2019-08-19: 30 [IU] via SUBCUTANEOUS
  Filled 2019-08-19 (×2): qty 0.3

## 2019-08-19 MED ORDER — ACETAMINOPHEN 650 MG RE SUPP
650.0000 mg | Freq: Four times a day (QID) | RECTAL | Status: DC | PRN
  Filled 2019-08-19: qty 1

## 2019-08-19 MED ORDER — SODIUM CHLORIDE 0.9 % IV BOLUS
1000.0000 mL | Freq: Once | INTRAVENOUS | Status: AC
  Administered 2019-08-19: 03:00:00 1000 mL via INTRAVENOUS

## 2019-08-19 MED ORDER — DEXTROSE 50 % IV SOLN: 1.0000 | Freq: Once | INTRAVENOUS | Status: DC

## 2019-08-19 MED ORDER — DEXTROSE 10 % IV SOLN
INTRAVENOUS | Status: DC
  Administered 2019-08-19: 05:00:00 via INTRAVENOUS

## 2019-08-19 MED ORDER — INSULIN ASPART 100 UNIT/ML ~~LOC~~ SOLN
0.0000 [IU] | Freq: Three times a day (TID) | SUBCUTANEOUS | Status: DC
  Administered 2019-08-20: 17:00:00 5 [IU] via SUBCUTANEOUS
  Administered 2019-08-20: 3 [IU] via SUBCUTANEOUS
  Administered 2019-08-21: 2 [IU] via SUBCUTANEOUS
  Administered 2019-08-21: 12:00:00 5 [IU] via SUBCUTANEOUS
  Filled 2019-08-19 (×4): qty 1

## 2019-08-19 MED ORDER — ACETAMINOPHEN 325 MG PO TABS
650.0000 mg | ORAL_TABLET | Freq: Four times a day (QID) | ORAL | Status: DC | PRN
  Administered 2019-08-21: 05:00:00 650 mg via ORAL
  Filled 2019-08-19: qty 2

## 2019-08-19 NOTE — ED Notes (Signed)
Pt resting quietly on ER stretcher.  NAD noted, pt voices no c/o at this time.  Pt awaiting bed placement, will continue to monitor.

## 2019-08-19 NOTE — ED Triage Notes (Signed)
Per EMS, patient was found hypoglycemic with lowered LOC. Patient's blood sugar was 32. Patient was given D510 and was found to have a CBG of 277. Patient is still lethargic.

## 2019-08-19 NOTE — ED Notes (Signed)
Pt provided meal tray, assisted to set up to eat and provided phone to call family.  Pt voices no other needs at this time, will continue to monitor.

## 2019-08-19 NOTE — H&P (Signed)
[facility] [place] NAME: Spencer [name]    MR#:  [id number]  DATE OF BIRTH:  07/24/1961  DATE OF ADMISSION:  08/19/2019  PRIMARY CARE PHYSICIAN: Jodi Marble, MD   REQUESTING/REFERRING PHYSICIAN: Marjean Donna, MD  CHIEF COMPLAINT:   Chief Complaint  Patient presents with   Hypoglycemia    HISTORY OF PRESENT ILLNESS:  Spencer [name]  is a 58 y.o. Caucasian male with a known history of ultimate status with decreased responsiveness associated low blood glucose levels down to 20.  The patient denies any nausea vomiting or abdominal pain or diarrhea.  No fever or chills.  Denies any headache or dizziness or blurred vision.  No paresthesias or focal muscle weakness.  He was fairly somnolent but arousable.  Denies any cough or wheezing or recent sick exposures to COVID-19.  He has been taking his insulin regularly.  Upon presentation to the emergency room, blood pressure was 139/96 and later 162/107 and otherwise vital signs were within normal.  Labs revealed a blood glucose of 112 with a BUN of 25 and creatinine 0.86.  CBC showed anemia close to his baseline.  Alcohol levels less than 10 and salicylate less than 7.  Noncontrasted head CT scan revealed no acute intracranial normalities.  It showed mild atrophy and small vessel ischemic changes in the white matter.  C-spine CT showed straightening of the C-spine with posttransfusion changes C5-C7 with no acute osseous abnormality. EKG showed normal sinus rhythm with rate of 60 with poor R wave progression.  The patient was given one half amp at the 50 as well as 1 L bolus of IV normal saline.  Was later started on D10 at 100 mL/h for persistent hyperglycemia.  He will be admitted to an observation medically monitored bed for further evaluation and management. PAST MEDICAL HISTORY:   Past Medical History:  Diagnosis Date   Brain aneurysm    Broken bones    clavical, ankle, arms toes wriast     Diabetes mellitus without complication (HCC)    Dysrhythmia    GERD (gastroesophageal reflux disease)    Hypertension    Substance abuse ([place])     PAST SURGICAL HISTORY:   Past Surgical History:  Procedure Laterality Date   ESOPHAGOGASTRODUODENOSCOPY (EGD) WITH PROPOFOL N/A 06/04/2019   Procedure: ESOPHAGOGASTRODUODENOSCOPY (EGD) WITH PROPOFOL;  Surgeon: Toledo, Benay Pike, MD;  Location: [facility] ENDOSCOPY;  Service: Gastroenterology;  Laterality: N/A;   HERNIA REPAIR     IMPLANTATION / PLACEMENT OF STRIP ELECTRODES VIA BURR HOLES SUBDURAL     anyrusum   NECK SURGERY     TEE WITHOUT CARDIOVERSION N/A 06/03/2019   Procedure: TRANSESOPHAGEAL ECHOCARDIOGRAM (TEE);  Surgeon: Teodoro Spray, MD;  Location: [facility] ORS;  Service: Cardiovascular;  Laterality: N/A;    SOCIAL HISTORY:   Social History   Tobacco Use   Smoking status: Current Some Day Smoker   Smokeless tobacco: Never Used  Substance Use Topics   Alcohol use: Yes    Alcohol/week: 1.0 standard drinks    Types: 1 Cans of beer per week    FAMILY HISTORY:   Family History  Problem Relation Age of Onset   CAD Sister    Hypertension Sister    Healthy Mother    Healthy Father     DRUG ALLERGIES:  No Known Allergies  REVIEW OF SYSTEMS:   ROS As per history of present illness. All pertinent systems were reviewed above. Constitutional,  HEENT,  cardiovascular, respiratory, GI, GU, musculoskeletal, neuro, psychiatric, endocrine,  integumentary and hematologic systems were reviewed and are otherwise  negative/unremarkable except for positive findings mentioned above in the HPI.   MEDICATIONS AT HOME:   Prior to Admission medications   Medication Sig Start Date End Date Taking? Authorizing Provider  insulin glargine (LANTUS) 100 UNIT/ML injection Inject 0.5 mLs (50 Units total) into the skin daily. 08/08/19  Yes Max Sane, MD  Multiple Vitamin (MULTIVITAMIN WITH MINERALS) TABS tablet Take 1 tablet by  mouth daily. 07/30/19  Yes Fritzi Mandes, MD  NOVOLOG FLEXPEN 100 UNIT/ML FlexPen Inject 0-30 Units into the skin 3 (three) times daily. 06/16/19  Yes [provider]  pantoprazole (PROTONIX) 40 MG tablet Take 1 tablet (40 mg total) by mouth daily. 06/05/19 08/19/19 Yes Pyreddy, Reatha Harps, MD  blood glucose meter kit and supplies Check blood sugar 4 times daily 01/08/19   Mayo, Pete Pelt, MD  Insulin Pen Needle 31G X 5 MM MISC Check blood sugars 4 times daily 01/08/19   Mayo, Pete Pelt, MD      VITAL SIGNS:  Blood pressure 114/74, pulse (!) 56, resp. rate 14, height 5' 10" (1.778 m), weight 64.5 kg, SpO2 98 %.  PHYSICAL EXAMINATION:  Physical Exam  GENERAL:  58 y.o.-year-old Caucasian male patient lying in the bed with no acute distress.  EYES: Pupils equal, round, reactive to light and accommodation. No scleral icterus. Extraocular muscles intact.  HEENT: Head atraumatic, normocephalic. Oropharynx and nasopharynx clear.  NECK:  Supple, no jugular venous distention. No thyroid enlargement, no tenderness.  LUNGS: Normal breath sounds bilaterally, no wheezing, rales,rhonchi or crepitation. No use of accessory muscles of respiration.  CARDIOVASCULAR: Regular rate and rhythm, S1, S2 normal. No murmurs, rubs, or gallops.  ABDOMEN: Soft, nondistended, nontender. Bowel sounds present. No organomegaly or mass.  EXTREMITIES: No pedal edema, cyanosis, or clubbing.  NEUROLOGIC: Cranial nerves II through XII are intact. Muscle strength 5/5 in all extremities. Sensation intact. Gait not checked.  PSYCHIATRIC: The patient is alert and oriented x 3.  Normal affect and good eye contact. SKIN: No obvious rash, lesion, or ulcer.   LABORATORY PANEL:   CBC Recent Labs  Lab 08/19/19 0203  WBC 9.9  HGB 11.8*  HCT 36.1*  PLT 354   ------------------------------------------------------------------------------------------------------------------  Chemistries  Recent Labs  Lab 08/19/19 0203  NA 142  K  4.0  CL 106  CO2 23  GLUCOSE 112*  BUN 25*  CREATININE 0.86  CALCIUM 9.6  AST 31  ALT 17  ALKPHOS 91  BILITOT 0.9   ------------------------------------------------------------------------------------------------------------------  Cardiac Enzymes No results for input(s): TROPONINI in the last 168 hours. ------------------------------------------------------------------------------------------------------------------  RADIOLOGY:  Ct Head Wo Contrast  Result Date: 08/19/2019 CLINICAL DATA:  Altered LOC EXAM: CT HEAD WITHOUT CONTRAST CT CERVICAL SPINE WITHOUT CONTRAST TECHNIQUE: Multidetector CT imaging of the head and cervical spine was performed following the standard protocol without intravenous contrast. Multiplanar CT image reconstructions of the cervical spine were also generated. COMPARISON:  07/20/2019 CT FINDINGS: CT HEAD FINDINGS Brain: No acute territorial infarction, hemorrhage, or intracranial mass. Mild atrophy and small vessel ischemic changes of the white matter. Stable ventricle size Vascular: No hyperdense vessels.  Carotid vascular calcification Skull: Normal. Negative for fracture or focal lesion. Sinuses/Orbits: No acute finding. Other: None CT CERVICAL SPINE FINDINGS Alignment: Straightening of the cervical spine. Facet alignment within normal limits. Skull base and vertebrae: No acute fracture. No primary bone lesion or focal pathologic process. Soft tissues and spinal canal: No  prevertebral fluid or swelling. No visible canal hematoma. Disc levels: Post fusion changes C5 through C7. Mild degenerative changes at C3-C4, C4-C5 and C7-T1. Upper chest: Negative. Other: Subcentimeter hypodensity in the left lobe of thyroid, no change IMPRESSION: 1. No CT evidence for acute intracranial abnormality. Mild atrophy and small vessel ischemic changes of the white matter 2. Straightening of the cervical spine with post fusion changes C5 through C7. No acute osseous abnormality.  Electronically Signed   By: Donavan Foil M.D.   On: 08/19/2019 03:10   Ct Cervical Spine Wo Contrast  Result Date: 08/19/2019 CLINICAL DATA:  Altered LOC EXAM: CT HEAD WITHOUT CONTRAST CT CERVICAL SPINE WITHOUT CONTRAST TECHNIQUE: Multidetector CT imaging of the head and cervical spine was performed following the standard protocol without intravenous contrast. Multiplanar CT image reconstructions of the cervical spine were also generated. COMPARISON:  07/20/2019 CT FINDINGS: CT HEAD FINDINGS Brain: No acute territorial infarction, hemorrhage, or intracranial mass. Mild atrophy and small vessel ischemic changes of the white matter. Stable ventricle size Vascular: No hyperdense vessels.  Carotid vascular calcification Skull: Normal. Negative for fracture or focal lesion. Sinuses/Orbits: No acute finding. Other: None CT CERVICAL SPINE FINDINGS Alignment: Straightening of the cervical spine. Facet alignment within normal limits. Skull base and vertebrae: No acute fracture. No primary bone lesion or focal pathologic process. Soft tissues and spinal canal: No prevertebral fluid or swelling. No visible canal hematoma. Disc levels: Post fusion changes C5 through C7. Mild degenerative changes at C3-C4, C4-C5 and C7-T1. Upper chest: Negative. Other: Subcentimeter hypodensity in the left lobe of thyroid, no change IMPRESSION: 1. No CT evidence for acute intracranial abnormality. Mild atrophy and small vessel ischemic changes of the white matter 2. Straightening of the cervical spine with post fusion changes C5 through C7. No acute osseous abnormality. Electronically Signed   By: Donavan Foil M.D.   On: 08/19/2019 03:10   Dg Chest Portable 1 View  Result Date: 08/19/2019 CLINICAL DATA:  Altered mental status EXAM: PORTABLE CHEST 1 VIEW COMPARISON:  08/06/2019 FINDINGS: Surgical hardware in the cervical spine. Mild bronchitic changes at the bases. No focal consolidation or effusion. Normal heart size. No  pneumothorax. Metallic BBs over the right lower chest. IMPRESSION: No active disease.  Mild basilar bronchitic changes Electronically Signed   By: Donavan Foil M.D.   On: 08/19/2019 03:01      IMPRESSION AND PLAN:   1.  Hypoglycemia with associated type 2 diabetes mellitus.  The patient will be admitted to an observation medically monitored bed.  He will be continued for now on D10W at 75 mL/h.  We will place him on supplemental coverage with sensitive NovoLog.  We will hold off Lantus and Metformin for now.  2.  Hypertensive urgency.  The patient will be placed on as needed IV Vasotec.  3.  GERD.  We will continue PPI therapy.  4.  Paroxysmal atrial fibrillation.  We will place him on aspirin.  He is currently in normal sinus rhythm.  5.  DVT prophylaxis.  Subcutaneous Lovenox   All the records are reviewed and case discussed with ED provider. The plan of care was discussed in details with the patient (and family). I answered all questions. The patient agreed to proceed with the above mentioned plan. Further management will depend upon hospital course.   CODE STATUS: Full code  TOTAL TIME TAKING CARE OF THIS PATIENT: 50 minutes.    Christel Mormon M.D on 08/19/2019 at 5:52 AM  Pager -  [phone]  After 6pm go to [email or web address] - Proofreader  [facility]  Office  [phone]  CC: Primary care physician; Jodi Marble, MD   Note: This dictation was prepared with Dragon dictation along with smaller phrase technology. Any transcriptional errors that result from this process are unintentional.

## 2019-08-19 NOTE — Progress Notes (Addendum)
Inpatient Diabetes Program Recommendations  AACE/ADA: New Consensus Statement on Inpatient Glycemic Control   Target Ranges:  Prepandial:   less than 140 mg/dL      Peak postprandial:   less than 180 mg/dL (1-2 hours)      Critically ill patients:  140 - 180 mg/dL   Results for Spencer [name], Spencer [name] (MRN [id number]) as of 08/19/2019 12:06  Ref. Range 08/19/2019 03:01 08/19/2019 03:13 08/19/2019 04:07 08/19/2019 06:25 08/19/2019 08:13 08/19/2019 11:39  Glucose-Capillary Latest Ref Range: 70 - 99 mg/dL 33 (LL) 24 (LL) 65 (L) 113 (H) 111 (H) 376 (H)   Review of Glycemic Control   Outpatient Diabetes medications: Lantus 50 units daily, Novolog 0-30 units TID with meals, Metformin 1000 mg BID Current orders for Inpatient glycemic control: Novolog 0-9 units TID with meals  Inpatient Diabetes Program Recommendations:   IV Fluids: Since patient is tolerating PO intake and glucose up to 376 mg/dl at 11:39, please discontinue D10.  Insulin-Basal: Please consider ordering Lantus 20 units QHS (approximately based on 64.5 kg x 0.3 units).  Insulin-Correction: Please consider ordering Novolog 0-5 units QHS for bedtime correction.   Insulin Meal Coverage: Please consider ordering Novolog 3 units TID with meals for meal coverage if patient eats at least 50% of meals.  NOTE: Patient is well known to inpatient diabetes team. Patient was just inpatient 08/07/19 to 08/08/19 and was last seen by diabetes coordinator on 08/08/19. Per discharge summary on 08/08/19, patient is to call [facility] to set up an appointment to re-establish care with Endocrinology to assist with improving DM. Patient admitted today with recurrent hypoglycemia which appears to be resolved.  Recommend adjusting outpatient Lantus and Novolog to similar regimen used as an inpatient to help improve outpatient DM control and encourage patient to re-establish care with Endocrinologist.  Noted patient cocaine positive (07/27/19, 08/07/19, and  08/19/19) which is likely effecting patient's ability and willingness to manage DM.  Thanks, Barnie Alderman, RN, MSN, CDE Diabetes Coordinator Inpatient Diabetes Program [phone] (Team Pager from 8am to 5pm)

## 2019-08-19 NOTE — ED Notes (Signed)
Patient to CT.

## 2019-08-19 NOTE — Progress Notes (Signed)
[facility] [place] NAME: Spencer [name]    MR#:  [id number]  DATE OF BIRTH:  [birth year]  SUBJECTIVE:  CHIEF COMPLAINT:   Chief Complaint  Patient presents with  . Hypoglycemia   Recent admission for DKA. Came with altered mental status and noted to have low blood sugars. Initially kept on dextrose 5% IV drip but blood sugar is going up now without any use of dextrose drip so I am starting back on Lantus. Patient has no complaints. He denies skipping any meals or taking any additional dose of insulin at home.  REVIEW OF SYSTEMS:  CONSTITUTIONAL: No fever, fatigue or weakness.  EYES: No blurred or double vision.  EARS, NOSE, AND THROAT: No tinnitus or ear pain.  RESPIRATORY: No cough, shortness of breath, wheezing or hemoptysis.  CARDIOVASCULAR: No chest pain, orthopnea, edema.  GASTROINTESTINAL: No nausea, vomiting, diarrhea or abdominal pain.  GENITOURINARY: No dysuria, hematuria.  ENDOCRINE: No polyuria, nocturia,  HEMATOLOGY: No anemia, easy bruising or bleeding SKIN: No rash or lesion. MUSCULOSKELETAL: No joint pain or arthritis.   NEUROLOGIC: No tingling, numbness, weakness.  PSYCHIATRY: No anxiety or depression.   ROS  DRUG ALLERGIES:  No Known Allergies  VITALS:  Blood pressure (!) 176/94, pulse 60, temperature 97.8 F (36.6 C), temperature source Oral, resp. rate 20, height 5\' 10"  (1.778 m), weight 64.5 kg, SpO2 100 %.  PHYSICAL EXAMINATION:  GENERAL:  58 y.o.-year-old patient lying in the bed with no acute distress.  EYES: Pupils equal, round, reactive to light and accommodation. No scleral icterus. Extraocular muscles intact.  HEENT: Head atraumatic, normocephalic. Oropharynx and nasopharynx clear.  NECK:  Supple, no jugular venous distention. No thyroid enlargement, no tenderness.  LUNGS: Normal breath sounds bilaterally, no wheezing, rales,rhonchi or crepitation. No use of accessory muscles of respiration.   CARDIOVASCULAR: S1, S2 normal. No murmurs, rubs, or gallops.  ABDOMEN: Soft, nontender, nondistended. Bowel sounds present. No organomegaly or mass.  EXTREMITIES: No pedal edema, cyanosis, or clubbing.  NEUROLOGIC: Cranial nerves II through XII are intact. Muscle strength 5/5 in all extremities. Sensation intact. Gait not checked.  PSYCHIATRIC: The patient is alert and oriented x 3.  SKIN: No obvious rash, lesion, or ulcer.   Physical Exam LABORATORY PANEL:   CBC Recent Labs  Lab 08/19/19 0203  WBC 9.9  HGB 11.8*  HCT 36.1*  PLT 354   ------------------------------------------------------------------------------------------------------------------  Chemistries  Recent Labs  Lab 08/19/19 0203  NA 142  K 4.0  CL 106  CO2 23  GLUCOSE 112*  BUN 25*  CREATININE 0.86  CALCIUM 9.6  AST 31  ALT 17  ALKPHOS 91  BILITOT 0.9   ------------------------------------------------------------------------------------------------------------------  Cardiac Enzymes No results for input(s): TROPONINI in the last 168 hours. ------------------------------------------------------------------------------------------------------------------  RADIOLOGY:  Ct Head Wo Contrast  Result Date: 08/19/2019 CLINICAL DATA:  Altered LOC EXAM: CT HEAD WITHOUT CONTRAST CT CERVICAL SPINE WITHOUT CONTRAST TECHNIQUE: Multidetector CT imaging of the head and cervical spine was performed following the standard protocol without intravenous contrast. Multiplanar CT image reconstructions of the cervical spine were also generated. COMPARISON:  07/20/2019 CT FINDINGS: CT HEAD FINDINGS Brain: No acute territorial infarction, hemorrhage, or intracranial mass. Mild atrophy and small vessel ischemic changes of the white matter. Stable ventricle size Vascular: No hyperdense vessels.  Carotid vascular calcification Skull: Normal. Negative for fracture or focal lesion. Sinuses/Orbits: No acute finding. Other: None CT  CERVICAL SPINE FINDINGS Alignment: Straightening of the cervical spine. Facet alignment within normal  limits. Skull base and vertebrae: No acute fracture. No primary bone lesion or focal pathologic process. Soft tissues and spinal canal: No prevertebral fluid or swelling. No visible canal hematoma. Disc levels: Post fusion changes C5 through C7. Mild degenerative changes at C3-C4, C4-C5 and C7-T1. Upper chest: Negative. Other: Subcentimeter hypodensity in the left lobe of thyroid, no change IMPRESSION: 1. No CT evidence for acute intracranial abnormality. Mild atrophy and small vessel ischemic changes of the white matter 2. Straightening of the cervical spine with post fusion changes C5 through C7. No acute osseous abnormality. Electronically Signed   By: Jasmine PangKim  Fujinaga M.D.   On: 08/19/2019 03:10   Ct Cervical Spine Wo Contrast  Result Date: 08/19/2019 CLINICAL DATA:  Altered LOC EXAM: CT HEAD WITHOUT CONTRAST CT CERVICAL SPINE WITHOUT CONTRAST TECHNIQUE: Multidetector CT imaging of the head and cervical spine was performed following the standard protocol without intravenous contrast. Multiplanar CT image reconstructions of the cervical spine were also generated. COMPARISON:  07/20/2019 CT FINDINGS: CT HEAD FINDINGS Brain: No acute territorial infarction, hemorrhage, or intracranial mass. Mild atrophy and small vessel ischemic changes of the white matter. Stable ventricle size Vascular: No hyperdense vessels.  Carotid vascular calcification Skull: Normal. Negative for fracture or focal lesion. Sinuses/Orbits: No acute finding. Other: None CT CERVICAL SPINE FINDINGS Alignment: Straightening of the cervical spine. Facet alignment within normal limits. Skull base and vertebrae: No acute fracture. No primary bone lesion or focal pathologic process. Soft tissues and spinal canal: No prevertebral fluid or swelling. No visible canal hematoma. Disc levels: Post fusion changes C5 through C7. Mild degenerative changes at  C3-C4, C4-C5 and C7-T1. Upper chest: Negative. Other: Subcentimeter hypodensity in the left lobe of thyroid, no change IMPRESSION: 1. No CT evidence for acute intracranial abnormality. Mild atrophy and small vessel ischemic changes of the white matter 2. Straightening of the cervical spine with post fusion changes C5 through C7. No acute osseous abnormality. Electronically Signed   By: Jasmine PangKim  Fujinaga M.D.   On: 08/19/2019 03:10   Dg Chest Portable 1 View  Result Date: 08/19/2019 CLINICAL DATA:  Altered mental status EXAM: PORTABLE CHEST 1 VIEW COMPARISON:  08/06/2019 FINDINGS: Surgical hardware in the cervical spine. Mild bronchitic changes at the bases. No focal consolidation or effusion. Normal heart size. No pneumothorax. Metallic BBs over the right lower chest. IMPRESSION: No active disease.  Mild basilar bronchitic changes Electronically Signed   By: Jasmine PangKim  Fujinaga M.D.   On: 08/19/2019 03:01    ASSESSMENT AND PLAN:   Active Problems:   Hypoglycemia  1.  Hypoglycemia with associated type 2 diabetes mellitus.   Initially kept on dextrose IV drip but blood sugar is now going up so dextrose drip stopped, still blood sugar more than 300 so I decided to start on lower dose of Lantus than home dose.  on supplemental coverage with sensitive NovoLog.  We will hold off  Metformin for now.  2.  Hypertensive urgency.  The patient will be placed on as needed IV Vasotec. Start on oral amlodipine.  3.  GERD.  We will continue PPI therapy.  4.  Paroxysmal atrial fibrillation.  We will place him on aspirin.  He is currently in normal sinus rhythm.  Chads score is 2, advised to follow with primary physician.  5.  DVT prophylaxis.  Subcutaneous Lovenox    All the records are reviewed and case discussed with Care Management/Social Workerr. Management plans discussed with the patient, family and they are in agreement.  CODE  STATUS: Full code  TOTAL TIME TAKING CARE OF THIS PATIENT: 35 minutes.      POSSIBLE D/C IN 1-2 DAYS, DEPENDING ON CLINICAL CONDITION.   Altamese Dilling M.D on 08/19/2019   Between 7am to 6pm - Pager - [phone]  After 6pm go to [email or web address] - password [email or web address]  [facility]  Office  [phone]  CC: Primary care physician; [name], MD  Note: This dictation was prepared with Dragon dictation along with smaller phrase technology. Any transcriptional errors that result from this process are unintentional.

## 2019-08-19 NOTE — ED Notes (Signed)
ED TO INPATIENT HANDOFF REPORT  ED Nurse Name and Phone #: Reuel Boom [phone]  S Name/Age/Gender Spencer [name] 58 y.o. male Room/Bed: ED13A/ED13A  Code Status   Code Status: Full Code  Home/SNF/Other Home Patient oriented to: self, place, time and situation Is this baseline? Yes   Triage Complete: Triage complete  Chief Complaint Hypoglycemia  Triage Note Per EMS, patient was found hypoglycemic with lowered LOC. Patient's blood sugar was 32. Patient was given D510 and was found to have a CBG of 277. Patient is still lethargic.   Allergies No Known Allergies  Level of Care/Admitting Diagnosis ED Disposition    ED Disposition Condition Comment   Admit  Hospital Area: [facility] [[id number]]  Level of Care: Med-Surg [16]  Covid Evaluation: Asymptomatic Screening Protocol (No Symptoms)  Diagnosis: Hypoglycemia [967591]  Admitting Physician: Hannah Beat [[id number]]  Attending Physician: Hannah Beat [[id number]]  PT Class (Do Not Modify): Observation [104]  PT Acc Code (Do Not Modify): Observation [10022]       B Medical/Surgery History Past Medical History:  Diagnosis Date  . Brain aneurysm   . Broken bones    clavical, ankle, arms toes wriast   . Diabetes mellitus without complication (HCC)   . Dysrhythmia   . GERD (gastroesophageal reflux disease)   . Hypertension   . Substance abuse [facility])    Past Surgical History:  Procedure Laterality Date  . ESOPHAGOGASTRODUODENOSCOPY (EGD) WITH PROPOFOL N/A 06/04/2019   Procedure: ESOPHAGOGASTRODUODENOSCOPY (EGD) WITH PROPOFOL;  Surgeon: Toledo, Boykin Nearing, MD;  Location: [facility] ENDOSCOPY;  Service: Gastroenterology;  Laterality: N/A;  . HERNIA REPAIR    . IMPLANTATION / PLACEMENT OF STRIP ELECTRODES VIA BURR HOLES SUBDURAL     anyrusum  . NECK SURGERY    . TEE WITHOUT CARDIOVERSION N/A 06/03/2019   Procedure: TRANSESOPHAGEAL ECHOCARDIOGRAM (TEE);  Surgeon: Dalia Heading, MD;  Location: [facility] ORS;   Service: Cardiovascular;  Laterality: N/A;     A IV Location/Drains/Wounds Patient Lines/Drains/Airways Status   Active Line/Drains/Airways    Name:   Placement date:   Placement time:   Site:   Days:   Peripheral IV 08/19/19 Left Forearm   08/19/19    0219    Forearm   less than 1   External Urinary Catheter   08/07/19    0330    -   12          Intake/Output Last 24 hours  Intake/Output Summary (Last 24 hours) at 08/19/2019 0618 Last data filed at 08/19/2019 0314 Gross per 24 hour  Intake 333 ml  Output -  Net 333 ml    Labs/Imaging Results for orders placed or performed during the hospital encounter of 08/19/19 (from the past 48 hour(s))  CBC with Differential     Status: Abnormal   Collection Time: 08/19/19  2:03 AM  Result Value Ref Range   WBC 9.9 4.0 - 10.5 K/uL   RBC 3.71 (L) 4.22 - 5.81 MIL/uL   Hemoglobin 11.8 (L) 13.0 - 17.0 g/dL   HCT 77.9 (L) 39.0 - 30.0 %   MCV 97.3 80.0 - 100.0 fL   MCH 31.8 26.0 - 34.0 pg   MCHC 32.7 30.0 - 36.0 g/dL   RDW 92.3 30.0 - 76.2 %   Platelets 354 150 - 400 K/uL   nRBC 0.0 0.0 - 0.2 %   Neutrophils Relative % 83 %   Neutro Abs 8.3 (H) 1.7 - 7.7 K/uL   Lymphocytes  Relative 8 %   Lymphs Abs 0.8 0.7 - 4.0 K/uL   Monocytes Relative 6 %   Monocytes Absolute 0.6 0.1 - 1.0 K/uL   Eosinophils Relative 1 %   Eosinophils Absolute 0.1 0.0 - 0.5 K/uL   Basophils Relative 1 %   Basophils Absolute 0.1 0.0 - 0.1 K/uL   Immature Granulocytes 1 %   Abs Immature Granulocytes 0.05 0.00 - 0.07 K/uL    Comment: Performed at [facility], [address] [place], [place] [address]  Comprehensive metabolic panel     Status: Abnormal   Collection Time: 08/19/19  2:03 AM  Result Value Ref Range   Sodium 142 135 - 145 mmol/L   Potassium 4.0 3.5 - 5.1 mmol/L    Comment: HEMOLYSIS AT THIS LEVEL MAY AFFECT RESULT   Chloride 106 98 - 111 mmol/L   CO2 23 22 - 32 mmol/L   Glucose, Bld 112 (H) 70 - 99 mg/dL   BUN 25 (H) 6 - 20 mg/dL    Creatinine, Ser 0.86 0.61 - 1.24 mg/dL   Calcium 9.6 8.9 - 10.3 mg/dL   Total Protein 6.9 6.5 - 8.1 g/dL   Albumin 4.0 3.5 - 5.0 g/dL   AST 31 15 - 41 U/L    Comment: HEMOLYSIS AT THIS LEVEL MAY AFFECT RESULT   ALT 17 0 - 44 U/L   Alkaline Phosphatase 91 38 - 126 U/L   Total Bilirubin 0.9 0.3 - 1.2 mg/dL    Comment: HEMOLYSIS AT THIS LEVEL MAY AFFECT RESULT   GFR calc non Af Amer >60 >60 mL/min   GFR calc Af Amer >60 >60 mL/min   Anion gap 13 5 - 15    Comment: Performed at [facility], [address] [place], [place] [address]  Ethanol     Status: None   Collection Time: 08/19/19  2:03 AM  Result Value Ref Range   Alcohol, Ethyl (B) <10 <10 mg/dL    Comment: (NOTE) Lowest detectable limit for serum alcohol is 10 mg/dL. For medical purposes only. Performed at [facility], [address] [place], [place] [address]   Salicylate level     Status: None   Collection Time: 08/19/19  2:03 AM  Result Value Ref Range   Salicylate Lvl <8.6 2.8 - 30.0 mg/dL    Comment: Performed at [facility], [place], [place] [address]  Acetaminophen level     Status: Abnormal   Collection Time: 08/19/19  2:03 AM  Result Value Ref Range   Acetaminophen (Tylenol), Serum <10 (L) 10 - 30 ug/mL    Comment: (NOTE) Therapeutic concentrations vary significantly. A range of 10-30 ug/mL  may be an effective concentration for many patients. However, some  are best treated at concentrations outside of this range. Acetaminophen concentrations >150 ug/mL at 4 hours after ingestion  and >50 ug/mL at 12 hours after ingestion are often associated with  toxic reactions. Performed at [facility], [place], [place] [address]   Glucose, capillary     Status: Abnormal   Collection Time: 08/19/19  3:01 AM  Result Value Ref Range   Glucose-Capillary 33 (LL) 70 - 99 mg/dL   Comment 1 Call MD NNP PA CNM   Glucose, capillary     Status:  Abnormal   Collection Time: 08/19/19  3:13 AM  Result Value Ref Range   Glucose-Capillary 24 (LL) 70 - 99 mg/dL   Comment 1 Call MD  NNP PA CNM   Glucose, capillary     Status: Abnormal   Collection Time: 08/19/19  4:07 AM  Result Value Ref Range   Glucose-Capillary 65 (L) 70 - 99 mg/dL   Ct Head Wo Contrast  Result Date: 08/19/2019 CLINICAL DATA:  Altered LOC EXAM: CT HEAD WITHOUT CONTRAST CT CERVICAL SPINE WITHOUT CONTRAST TECHNIQUE: Multidetector CT imaging of the head and cervical spine was performed following the standard protocol without intravenous contrast. Multiplanar CT image reconstructions of the cervical spine were also generated. COMPARISON:  07/20/2019 CT FINDINGS: CT HEAD FINDINGS Brain: No acute territorial infarction, hemorrhage, or intracranial mass. Mild atrophy and small vessel ischemic changes of the white matter. Stable ventricle size Vascular: No hyperdense vessels.  Carotid vascular calcification Skull: Normal. Negative for fracture or focal lesion. Sinuses/Orbits: No acute finding. Other: None CT CERVICAL SPINE FINDINGS Alignment: Straightening of the cervical spine. Facet alignment within normal limits. Skull base and vertebrae: No acute fracture. No primary bone lesion or focal pathologic process. Soft tissues and spinal canal: No prevertebral fluid or swelling. No visible canal hematoma. Disc levels: Post fusion changes C5 through C7. Mild degenerative changes at C3-C4, C4-C5 and C7-T1. Upper chest: Negative. Other: Subcentimeter hypodensity in the left lobe of thyroid, no change IMPRESSION: 1. No CT evidence for acute intracranial abnormality. Mild atrophy and small vessel ischemic changes of the white matter 2. Straightening of the cervical spine with post fusion changes C5 through C7. No acute osseous abnormality. Electronically Signed   By: Jasmine Pang M.D.   On: 08/19/2019 03:10   Ct Cervical Spine Wo Contrast  Result Date: 08/19/2019 CLINICAL DATA:  Altered LOC  EXAM: CT HEAD WITHOUT CONTRAST CT CERVICAL SPINE WITHOUT CONTRAST TECHNIQUE: Multidetector CT imaging of the head and cervical spine was performed following the standard protocol without intravenous contrast. Multiplanar CT image reconstructions of the cervical spine were also generated. COMPARISON:  07/20/2019 CT FINDINGS: CT HEAD FINDINGS Brain: No acute territorial infarction, hemorrhage, or intracranial mass. Mild atrophy and small vessel ischemic changes of the white matter. Stable ventricle size Vascular: No hyperdense vessels.  Carotid vascular calcification Skull: Normal. Negative for fracture or focal lesion. Sinuses/Orbits: No acute finding. Other: None CT CERVICAL SPINE FINDINGS Alignment: Straightening of the cervical spine. Facet alignment within normal limits. Skull base and vertebrae: No acute fracture. No primary bone lesion or focal pathologic process. Soft tissues and spinal canal: No prevertebral fluid or swelling. No visible canal hematoma. Disc levels: Post fusion changes C5 through C7. Mild degenerative changes at C3-C4, C4-C5 and C7-T1. Upper chest: Negative. Other: Subcentimeter hypodensity in the left lobe of thyroid, no change IMPRESSION: 1. No CT evidence for acute intracranial abnormality. Mild atrophy and small vessel ischemic changes of the white matter 2. Straightening of the cervical spine with post fusion changes C5 through C7. No acute osseous abnormality. Electronically Signed   By: Jasmine Pang M.D.   On: 08/19/2019 03:10   Dg Chest Portable 1 View  Result Date: 08/19/2019 CLINICAL DATA:  Altered mental status EXAM: PORTABLE CHEST 1 VIEW COMPARISON:  08/06/2019 FINDINGS: Surgical hardware in the cervical spine. Mild bronchitic changes at the bases. No focal consolidation or effusion. Normal heart size. No pneumothorax. Metallic BBs over the right lower chest. IMPRESSION: No active disease.  Mild basilar bronchitic changes Electronically Signed   By: Jasmine Pang M.D.   On:  08/19/2019 03:01    Pending Labs [employer] (From admission, onward)    Start  Ordered   08/26/19 0500  Creatinine, serum  (enoxaparin (LOVENOX)    CrCl >/= 30 ml/min)  Weekly,   STAT    Comments: while on enoxaparin therapy    08/19/19 0551   08/20/19 0500  Basic metabolic panel  Tomorrow morning,   STAT     08/19/19 0551   08/20/19 0500  Comprehensive metabolic panel  Tomorrow morning,   STAT     08/19/19 0551   08/19/19 0550  Hemoglobin A1c  Once,   STAT     08/19/19 0551   08/19/19 0441  SARS CORONAVIRUS 2 (TAT 6-24 HRS) Nasopharyngeal Nasopharyngeal Swab  (Asymptomatic/Tier 2 Patients Labs)  Once,   STAT    Question Answer Comment  Is this test for diagnosis or screening Screening   Symptomatic for COVID-19 as defined by CDC No   Hospitalized for COVID-19 No   Admitted to ICU for COVID-19 No   Previously tested for COVID-19 Yes   Resident in a congregate (group) care setting No   Employed in healthcare setting No      08/19/19 0440   08/19/19 0232  Urinalysis, Routine w reflex microscopic  Once,   STAT     08/19/19 0231   08/19/19 0231  Urine Drug Screen, Qualitative ([facility] only)  Once,   STAT     08/19/19 0231          Vitals/Pain Today's Vitals   08/19/19 0430 08/19/19 0500 08/19/19 0530 08/19/19 0600  BP: (!) 158/86 126/87 (!) 138/95 (!) 162/107  Pulse: (!) 59 (!) 56 63 63  Resp: 18 15 11 16   SpO2: 99% 97% 99% 99%  Weight:      Height:      PainSc:        Isolation Precautions No active isolations  Medications Medications  dextrose 10 % infusion ( Intravenous New Bag/Given 08/19/19 0434)  pantoprazole (PROTONIX) EC tablet 40 mg (has no administration in time range)  multivitamin with minerals tablet 1 tablet (has no administration in time range)  enoxaparin (LOVENOX) injection 40 mg (has no administration in time range)  acetaminophen (TYLENOL) tablet 650 mg (has no administration in time range)    Or  acetaminophen (TYLENOL) suppository 650  mg (has no administration in time range)  traZODone (DESYREL) tablet 25 mg (has no administration in time range)  ondansetron (ZOFRAN) tablet 4 mg (has no administration in time range)    Or  ondansetron (ZOFRAN) injection 4 mg (has no administration in time range)  magnesium hydroxide (MILK OF MAGNESIA) suspension 30 mL (has no administration in time range)  insulin aspart (novoLOG) injection 0-9 Units (has no administration in time range)  dextrose 10 % infusion (has no administration in time range)  sodium chloride 0.9 % bolus 1,000 mL (0 mLs Intravenous Stopped 08/19/19 0314)  dextrose 50 % solution 50 mL (50 mLs Intravenous Given 08/19/19 0320)  dextrose 50 % solution 25 mL (25 mLs Intravenous Given 08/19/19 0428)    Mobility walks Low fall risk   Focused Assessments Neuro Assessment Handoff:  Swallow screen pass? N/A         Neuro Assessment:   Neuro Checks:      Last Documented NIHSS Modified Score:   Has TPA been given? No If patient is a Neuro Trauma and patient is going to OR before floor call report to 4N Charge nurse: [phone] or [phone]     R Recommendations: See Admitting Provider Note  Report given to:   Additional Notes:

## 2019-08-19 NOTE — ED Notes (Signed)
Report given to Jennifer.

## 2019-08-19 NOTE — ED Provider Notes (Signed)
[facility] Emergency Department Provider Note  ____________________________________________   First MD Initiated Contact with Patient 08/19/19 0225     (approximate)  I have reviewed the triage vital signs and the nursing notes.   HISTORY  Chief Complaint Hypoglycemia    HPI Spencer [name] is a 58 y.o. male with diabetes and admissions for hyperglycemia and DKA who presents with hypoglycemia.  Patient was found with LOC.  Patient had a sugar of 32.  Patient was given D10 and found to have a repeat glucose of 277.   On assessment patient patient opens his eyes occasionally to voice but does to tactile stimulation.  He is able to weakly squeeze his bilateral hands.  However he is nonverbal and not otherwise responding verbally.   Unable to get full HPI due to patient's altered mental status.          Past Medical History:  Diagnosis Date  . Brain aneurysm   . Broken bones    clavical, ankle, arms toes wriast   . Diabetes mellitus without complication ([place])   . Dysrhythmia   . GERD (gastroesophageal reflux disease)   . Hypertension   . Substance abuse [facility])     Patient Active Problem List   Diagnosis Date Noted  . Atrial fibrillation with RVR ([place]) 08/07/2019  . GERD (gastroesophageal reflux disease) 08/07/2019  . Acute renal failure ([place])   . Hyperkalemia   . Sepsis ([place]) 06/09/2019  . AKI (acute kidney injury) ([place]) 02/19/2019  . Hypoglycemia 10/23/2018  . Chronic low back pain 01/16/2017  . Chronic neck pain 01/16/2017  . Diabetic neuropathy ([place]) 01/16/2017  . DM2 (diabetes mellitus, type 2) ([place]) 01/16/2017  . History of cocaine abuse ([place]) 01/16/2017  . Personal history of subdural hematoma 01/16/2017  . Closed displaced fracture of body of left calcaneus with delayed healing 12/13/2016  . Protein-calorie malnutrition, severe 11/08/2016  . DKA, type 2 ([place]) 11/06/2016  . DKA (diabetic ketoacidoses) ([place]) 04/03/2015  .  Hypertension 04/03/2015  . Depression 04/03/2015  . Opiate dependence ([place]) 04/03/2015  . Tobacco abuse 04/03/2015  . Lumbosacral neuritis 07/19/2014  . Pain of finger of right hand 07/19/2014  . Type II or unspecified type diabetes mellitus without mention of complication, not stated as uncontrolled 07/19/2014  . Knee pain 09/12/2013  . Hernia of flank 09/20/2012  . Epidermoid cyst of skin 09/20/2012  . Chronic pain of both shoulders 03/27/2012    Past Surgical History:  Procedure Laterality Date  . ESOPHAGOGASTRODUODENOSCOPY (EGD) WITH PROPOFOL N/A 06/04/2019   Procedure: ESOPHAGOGASTRODUODENOSCOPY (EGD) WITH PROPOFOL;  Surgeon: Toledo, Benay Pike, MD;  Location: [facility] ENDOSCOPY;  Service: Gastroenterology;  Laterality: N/A;  . HERNIA REPAIR    . IMPLANTATION / PLACEMENT OF STRIP ELECTRODES VIA BURR HOLES SUBDURAL     anyrusum  . NECK SURGERY    . TEE WITHOUT CARDIOVERSION N/A 06/03/2019   Procedure: TRANSESOPHAGEAL ECHOCARDIOGRAM (TEE);  Surgeon: Teodoro Spray, MD;  Location: [facility] ORS;  Service: Cardiovascular;  Laterality: N/A;    Prior to Admission medications   Medication Sig Start Date End Date Taking? Authorizing Provider  blood glucose meter kit and supplies Check blood sugar 4 times daily 01/08/19   Mayo, Pete Pelt, MD  insulin glargine (LANTUS) 100 UNIT/ML injection Inject 0.5 mLs (50 Units total) into the skin daily. 08/08/19   Max Sane, MD  Insulin Pen Needle 31G X 5 MM MISC Check blood sugars 4 times daily 01/08/19   Mayo, Pete Pelt,  MD  Multiple Vitamin (MULTIVITAMIN WITH MINERALS) TABS tablet Take 1 tablet by mouth daily. 07/30/19   Fritzi Mandes, MD  NOVOLOG FLEXPEN 100 UNIT/ML FlexPen Inject 0-30 Units into the skin 3 (three) times daily. 06/16/19   [provider]  pantoprazole (PROTONIX) 40 MG tablet Take 1 tablet (40 mg total) by mouth daily. 06/05/19 08/07/19  Saundra Shelling, MD    Allergies Patient has no known allergies.  Family History  Problem Relation  Age of Onset  . CAD Sister   . Hypertension Sister   . Healthy Mother   . Healthy Father     Social History Social History   Tobacco Use  . Smoking status: Current Some Day Smoker  . Smokeless tobacco: Never Used  Substance Use Topics  . Alcohol use: Yes    Alcohol/week: 1.0 standard drinks    Types: 1 Cans of beer per week  . Drug use: Not Currently    Types: Cocaine      Review of Systems Unable to get full full review system due to patient's altered mental status ____________________________________________   PHYSICAL EXAM:  VITAL SIGNS: Blood pressure 114/74, pulse (!) 56, resp. rate 14, height 5' 10"  (1.778 m), weight 64.5 kg, SpO2 98 %.   Constitutional: Lethargic, responds to tactile stimulation Eyes: Conjunctivae are normal. EOMI. Head: Atraumatic. Nose: No congestion/rhinnorhea. Mouth/Throat: Mucous membranes are moist.   Neck: No stridor. Trachea Midline. FROM Cardiovascular: Normal rate, regular rhythm. Grossly normal heart sounds.  Good peripheral circulation. Respiratory: Normal respiratory effort.  No retractions. Lungs CTAB. Gastrointestinal: Soft and nontender. No distention. No abdominal bruits.  Musculoskeletal: No lower extremity tenderness nor edema.  No joint effusions. Neurologic: Squeezes bilateral arms.  Not moving his legs with command.  Lethargic no obvious facial droop Skin:  Skin is warm, dry and intact. No rash noted. Psychiatric: Unable to fully assess due to altered mental status. GU: Deferred   ____________________________________________   LABS (all labs ordered are listed, but only abnormal results are displayed)  Labs Reviewed  CBC WITH DIFFERENTIAL/PLATELET - Abnormal; Notable for the following components:      Result Value   RBC 3.71 (*)    Hemoglobin 11.8 (*)    HCT 36.1 (*)    Neutro Abs 8.3 (*)    All other components within normal limits  COMPREHENSIVE METABOLIC PANEL - Abnormal; Notable for the following  components:   Glucose, Bld 112 (*)    BUN 25 (*)    All other components within normal limits  ACETAMINOPHEN LEVEL - Abnormal; Notable for the following components:   Acetaminophen (Tylenol), Serum <10 (*)    All other components within normal limits  GLUCOSE, CAPILLARY - Abnormal; Notable for the following components:   Glucose-Capillary 33 (*)    All other components within normal limits  GLUCOSE, CAPILLARY - Abnormal; Notable for the following components:   Glucose-Capillary 24 (*)    All other components within normal limits  GLUCOSE, CAPILLARY - Abnormal; Notable for the following components:   Glucose-Capillary 65 (*)    All other components within normal limits  ETHANOL  SALICYLATE LEVEL  URINE DRUG SCREEN, QUALITATIVE ([facility] ONLY)  URINALYSIS, ROUTINE W REFLEX MICROSCOPIC  CBG MONITORING, ED  CBG MONITORING, ED  CBG MONITORING, ED   ____________________________________________   ED ECG REPORT I, Vanessa , the attending physician, personally viewed and interpreted this ECG.  EKG is normal sinus rate of 60, no ST elevation, no T wave inversion, normal intervals ____________________________________________  RADIOLOGY I, Vanessa [place], personally viewed and evaluated these images (plain radiographs) as part of my medical decision making, as well as reviewing the written report by the radiologist.  ED MD interpretation: Chest x-ray no pneumonia.  Official radiology report(s): Ct Head Wo Contrast  Result Date: 08/19/2019 CLINICAL DATA:  Altered LOC EXAM: CT HEAD WITHOUT CONTRAST CT CERVICAL SPINE WITHOUT CONTRAST TECHNIQUE: Multidetector CT imaging of the head and cervical spine was performed following the standard protocol without intravenous contrast. Multiplanar CT image reconstructions of the cervical spine were also generated. COMPARISON:  07/20/2019 CT FINDINGS: CT HEAD FINDINGS Brain: No acute territorial infarction, hemorrhage, or intracranial mass. Mild atrophy  and small vessel ischemic changes of the white matter. Stable ventricle size Vascular: No hyperdense vessels.  Carotid vascular calcification Skull: Normal. Negative for fracture or focal lesion. Sinuses/Orbits: No acute finding. Other: None CT CERVICAL SPINE FINDINGS Alignment: Straightening of the cervical spine. Facet alignment within normal limits. Skull base and vertebrae: No acute fracture. No primary bone lesion or focal pathologic process. Soft tissues and spinal canal: No prevertebral fluid or swelling. No visible canal hematoma. Disc levels: Post fusion changes C5 through C7. Mild degenerative changes at C3-C4, C4-C5 and C7-T1. Upper chest: Negative. Other: Subcentimeter hypodensity in the left lobe of thyroid, no change IMPRESSION: 1. No CT evidence for acute intracranial abnormality. Mild atrophy and small vessel ischemic changes of the white matter 2. Straightening of the cervical spine with post fusion changes C5 through C7. No acute osseous abnormality. Electronically Signed   By: Donavan Foil M.D.   On: 08/19/2019 03:10   Ct Cervical Spine Wo Contrast  Result Date: 08/19/2019 CLINICAL DATA:  Altered LOC EXAM: CT HEAD WITHOUT CONTRAST CT CERVICAL SPINE WITHOUT CONTRAST TECHNIQUE: Multidetector CT imaging of the head and cervical spine was performed following the standard protocol without intravenous contrast. Multiplanar CT image reconstructions of the cervical spine were also generated. COMPARISON:  07/20/2019 CT FINDINGS: CT HEAD FINDINGS Brain: No acute territorial infarction, hemorrhage, or intracranial mass. Mild atrophy and small vessel ischemic changes of the white matter. Stable ventricle size Vascular: No hyperdense vessels.  Carotid vascular calcification Skull: Normal. Negative for fracture or focal lesion. Sinuses/Orbits: No acute finding. Other: None CT CERVICAL SPINE FINDINGS Alignment: Straightening of the cervical spine. Facet alignment within normal limits. Skull base and  vertebrae: No acute fracture. No primary bone lesion or focal pathologic process. Soft tissues and spinal canal: No prevertebral fluid or swelling. No visible canal hematoma. Disc levels: Post fusion changes C5 through C7. Mild degenerative changes at C3-C4, C4-C5 and C7-T1. Upper chest: Negative. Other: Subcentimeter hypodensity in the left lobe of thyroid, no change IMPRESSION: 1. No CT evidence for acute intracranial abnormality. Mild atrophy and small vessel ischemic changes of the white matter 2. Straightening of the cervical spine with post fusion changes C5 through C7. No acute osseous abnormality. Electronically Signed   By: Donavan Foil M.D.   On: 08/19/2019 03:10   Dg Chest Portable 1 View  Result Date: 08/19/2019 CLINICAL DATA:  Altered mental status EXAM: PORTABLE CHEST 1 VIEW COMPARISON:  08/06/2019 FINDINGS: Surgical hardware in the cervical spine. Mild bronchitic changes at the bases. No focal consolidation or effusion. Normal heart size. No pneumothorax. Metallic BBs over the right lower chest. IMPRESSION: No active disease.  Mild basilar bronchitic changes Electronically Signed   By: Donavan Foil M.D.   On: 08/19/2019 03:01    ____________________________________________   PROCEDURES  Procedure(s) performed (including Critical Care):  Marland Kitchen  Critical Care Performed by: Vanessa [place], MD Authorized by: Vanessa [place], MD   Critical care provider statement:    Critical care time (minutes):  30   Critical care was necessary to treat or prevent imminent or life-threatening deterioration of the following conditions:  Endocrine crisis   Critical care was time spent personally by me on the following activities:  Discussions with consultants, evaluation of patient's response to treatment, examination of patient, ordering and performing treatments and interventions, ordering and review of laboratory studies, ordering and review of radiographic studies, pulse oximetry, re-evaluation of  patient's condition, obtaining history from patient or surrogate and review of old charts     ____________________________________________   INITIAL IMPRESSION / ASSESSMENT AND PLAN / ED COURSE  Garald Braver was evaluated in Emergency Department on 08/19/2019 for the symptoms described in the history of present illness. He was evaluated in the context of the global COVID-19 pandemic, which necessitated consideration that the patient might be at risk for infection with the SARS-CoV-2 virus that causes COVID-19. Institutional protocols and algorithms that pertain to the evaluation of patients at risk for COVID-19 are in a state of rapid change based on information released by regulatory bodies including the CDC and federal and state organizations. These policies and algorithms were followed during the patient's care in the ED.    Patient presents with altered mental status most likely secondary to hypoglycemia.  Given unclear story will get CT head to evaluate for epidural subdural hematoma CT cervical to evaluate for cervical fracture.  Will get labs evaluate for electrolyte abnormalities, substance abuse, ethanol abuse.  We will continue to keep a close eye on his sugar.   Repeat sugar here low in 30s will give 1 amp of D50.    Labs otherwise are reassuring.  Repeat sugar 65.  Will give another 1/2 amp and start on d10 and d/w hospital team for admission.      ____________________________________________   FINAL CLINICAL IMPRESSION(S) / ED DIAGNOSES   Final diagnoses:  Hypoglycemia      MEDICATIONS GIVEN DURING THIS VISIT:  Medications  dextrose 10 % infusion (has no administration in time range)  dextrose 50 % solution 25 mL (has no administration in time range)  sodium chloride 0.9 % bolus 1,000 mL (0 mLs Intravenous Stopped 08/19/19 0314)  dextrose 50 % solution 50 mL (50 mLs Intravenous Given 08/19/19 0320)     ED Discharge Orders    None       Note:  This  document was prepared using Dragon voice recognition software and may include unintentional dictation errors.   Vanessa [place], MD 08/19/19 [phone]

## 2019-08-19 NOTE — ED Notes (Signed)
Patient back from CT.

## 2019-08-20 DIAGNOSIS — E11649 Type 2 diabetes mellitus with hypoglycemia without coma: Secondary | ICD-10-CM | POA: Diagnosis present

## 2019-08-20 DIAGNOSIS — I10 Essential (primary) hypertension: Secondary | ICD-10-CM | POA: Diagnosis present

## 2019-08-20 DIAGNOSIS — Z8249 Family history of ischemic heart disease and other diseases of the circulatory system: Secondary | ICD-10-CM | POA: Diagnosis not present

## 2019-08-20 DIAGNOSIS — K219 Gastro-esophageal reflux disease without esophagitis: Secondary | ICD-10-CM | POA: Diagnosis present

## 2019-08-20 DIAGNOSIS — E1140 Type 2 diabetes mellitus with diabetic neuropathy, unspecified: Secondary | ICD-10-CM | POA: Diagnosis present

## 2019-08-20 DIAGNOSIS — E162 Hypoglycemia, unspecified: Secondary | ICD-10-CM | POA: Diagnosis present

## 2019-08-20 DIAGNOSIS — Z20828 Contact with and (suspected) exposure to other viral communicable diseases: Secondary | ICD-10-CM | POA: Diagnosis present

## 2019-08-20 DIAGNOSIS — I160 Hypertensive urgency: Secondary | ICD-10-CM | POA: Diagnosis present

## 2019-08-20 DIAGNOSIS — F17210 Nicotine dependence, cigarettes, uncomplicated: Secondary | ICD-10-CM | POA: Diagnosis present

## 2019-08-20 DIAGNOSIS — I480 Paroxysmal atrial fibrillation: Secondary | ICD-10-CM | POA: Diagnosis present

## 2019-08-20 DIAGNOSIS — Z794 Long term (current) use of insulin: Secondary | ICD-10-CM | POA: Diagnosis not present

## 2019-08-20 DIAGNOSIS — Z79899 Other long term (current) drug therapy: Secondary | ICD-10-CM | POA: Diagnosis not present

## 2019-08-20 LAB — COMPREHENSIVE METABOLIC PANEL
ALT: 15 U/L (ref 0–44)
AST: 20 U/L (ref 15–41)
Albumin: 3.2 g/dL — ABNORMAL LOW (ref 3.5–5.0)
Alkaline Phosphatase: 81 U/L (ref 38–126)
Anion gap: 7 (ref 5–15)
BUN: 21 mg/dL — ABNORMAL HIGH (ref 6–20)
CO2: 25 mmol/L (ref 22–32)
Calcium: 8.8 mg/dL — ABNORMAL LOW (ref 8.9–10.3)
Chloride: 107 mmol/L (ref 98–111)
Creatinine, Ser: 0.66 mg/dL (ref 0.61–1.24)
GFR calc Af Amer: >60 mL/min (ref >60)
GFR calc non Af Amer: >60 mL/min (ref >60)
Glucose, Bld: 29 mg/dL — CL (ref 70–99)
Potassium: 3.8 mmol/L (ref 3.5–5.1)
Sodium: 139 mmol/L (ref 135–145)
Total Bilirubin: 0.4 mg/dL (ref 0.3–1.2)
Total Protein: 5.8 g/dL — ABNORMAL LOW (ref 6.5–8.1)

## 2019-08-20 LAB — GLUCOSE, CAPILLARY
Glucose-Capillary: 101 mg/dL — ABNORMAL HIGH (ref 70–99)
Glucose-Capillary: 63 mg/dL — ABNORMAL LOW (ref 70–99)
Glucose-Capillary: 219 mg/dL — ABNORMAL HIGH (ref 70–99)
Glucose-Capillary: 290 mg/dL — ABNORMAL HIGH (ref 70–99)
Glucose-Capillary: 151 mg/dL — ABNORMAL HIGH (ref 70–99)

## 2019-08-20 MED ORDER — INSULIN GLARGINE 100 UNIT/ML ~~LOC~~ SOLN
20.0000 [IU] | Freq: Every day | SUBCUTANEOUS | Status: DC
  Administered 2019-08-20 – 2019-08-21 (×2): 20 [IU] via SUBCUTANEOUS
  Filled 2019-08-20 (×3): qty 0.2

## 2019-08-20 NOTE — Progress Notes (Signed)
Hypoglycemic Event  CBG:29  Treatment: 4 oz juice  Symptoms: None  Follow-up CBG: Time:0705 CBG Result: 63 Patient given another 4 oz of juice. Repeat BS time: 0720  Possible Reasons for Event:   Comments/MD notified    Spencer [name]

## 2019-08-20 NOTE — Progress Notes (Signed)
Recheck: CBG 101 at 0725

## 2019-08-20 NOTE — Progress Notes (Signed)
[place] [facility] [place] NAME: Spencer [name]    MR#:  [id number]  DATE OF BIRTH:  1961/02/27  SUBJECTIVE:  CHIEF COMPLAINT:   Chief Complaint  Patient presents with  . Hypoglycemia   Recent admission for DKA. Came with altered mental status and noted to have low blood sugars. Initially kept on dextrose 5% IV drip but blood sugar is going up now without any use of dextrose drip so I am starting back on Lantus. Patient has no complaints. He denies skipping any meals or taking any additional dose of insulin at home. His blood sugar was going high yesterday so we are started on a small dose of Lantus today morning again blood sugar dropped to 30.  REVIEW OF SYSTEMS:  CONSTITUTIONAL: No fever, fatigue or weakness.  EYES: No blurred or double vision.  EARS, NOSE, AND THROAT: No tinnitus or ear pain.  RESPIRATORY: No cough, shortness of breath, wheezing or hemoptysis.  CARDIOVASCULAR: No chest pain, orthopnea, edema.  GASTROINTESTINAL: No nausea, vomiting, diarrhea or abdominal pain.  GENITOURINARY: No dysuria, hematuria.  ENDOCRINE: No polyuria, nocturia,  HEMATOLOGY: No anemia, easy bruising or bleeding SKIN: No rash or lesion. MUSCULOSKELETAL: No joint pain or arthritis.   NEUROLOGIC: No tingling, numbness, weakness.  PSYCHIATRY: No anxiety or depression.   ROS  DRUG ALLERGIES:  No Known Allergies  VITALS:  Blood pressure 110/75, pulse 69, temperature 98.4 F (36.9 C), temperature source Oral, resp. rate 18, height 5\' 10"  (1.778 m), weight 64.5 kg, SpO2 100 %.  PHYSICAL EXAMINATION:  GENERAL:  58 y.o.-year-old patient lying in the bed with no acute distress.  EYES: Pupils equal, round, reactive to light and accommodation. No scleral icterus. Extraocular muscles intact.  HEENT: Head atraumatic, normocephalic. Oropharynx and nasopharynx clear.  NECK:  Supple, no jugular venous distention. No thyroid enlargement, no tenderness.  LUNGS: Normal  breath sounds bilaterally, no wheezing, rales,rhonchi or crepitation. No use of accessory muscles of respiration.  CARDIOVASCULAR: S1, S2 normal. No murmurs, rubs, or gallops.  ABDOMEN: Soft, nontender, nondistended. Bowel sounds present. No organomegaly or mass.  EXTREMITIES: No pedal edema, cyanosis, or clubbing.  NEUROLOGIC: Cranial nerves II through XII are intact. Muscle strength 5/5 in all extremities. Sensation intact. Gait not checked.  PSYCHIATRIC: The patient is alert and oriented x 3.  SKIN: No obvious rash, lesion, or ulcer.   Physical Exam LABORATORY PANEL:   CBC Recent Labs  Lab 08/19/19 0203  WBC 9.9  HGB 11.8*  HCT 36.1*  PLT 354   ------------------------------------------------------------------------------------------------------------------  Chemistries  Recent Labs  Lab 08/20/19 0531  NA 139  K 3.8  CL 107  CO2 25  GLUCOSE 29*  BUN 21*  CREATININE 0.66  CALCIUM 8.8*  AST 20  ALT 15  ALKPHOS 81  BILITOT 0.4   ------------------------------------------------------------------------------------------------------------------  Cardiac Enzymes No results for input(s): TROPONINI in the last 168 hours. ------------------------------------------------------------------------------------------------------------------  RADIOLOGY:  Ct Head Wo Contrast  Result Date: 08/19/2019 CLINICAL DATA:  Altered LOC EXAM: CT HEAD WITHOUT CONTRAST CT CERVICAL SPINE WITHOUT CONTRAST TECHNIQUE: Multidetector CT imaging of the head and cervical spine was performed following the standard protocol without intravenous contrast. Multiplanar CT image reconstructions of the cervical spine were also generated. COMPARISON:  07/20/2019 CT FINDINGS: CT HEAD FINDINGS Brain: No acute territorial infarction, hemorrhage, or intracranial mass. Mild atrophy and small vessel ischemic changes of the white matter. Stable ventricle size Vascular: No hyperdense vessels.  Carotid vascular  calcification Skull: Normal. Negative for  fracture or focal lesion. Sinuses/Orbits: No acute finding. Other: None CT CERVICAL SPINE FINDINGS Alignment: Straightening of the cervical spine. Facet alignment within normal limits. Skull base and vertebrae: No acute fracture. No primary bone lesion or focal pathologic process. Soft tissues and spinal canal: No prevertebral fluid or swelling. No visible canal hematoma. Disc levels: Post fusion changes C5 through C7. Mild degenerative changes at C3-C4, C4-C5 and C7-T1. Upper chest: Negative. Other: Subcentimeter hypodensity in the left lobe of thyroid, no change IMPRESSION: 1. No CT evidence for acute intracranial abnormality. Mild atrophy and small vessel ischemic changes of the white matter 2. Straightening of the cervical spine with post fusion changes C5 through C7. No acute osseous abnormality. Electronically Signed   By: Jasmine Pang M.D.   On: 08/19/2019 03:10   Ct Cervical Spine Wo Contrast  Result Date: 08/19/2019 CLINICAL DATA:  Altered LOC EXAM: CT HEAD WITHOUT CONTRAST CT CERVICAL SPINE WITHOUT CONTRAST TECHNIQUE: Multidetector CT imaging of the head and cervical spine was performed following the standard protocol without intravenous contrast. Multiplanar CT image reconstructions of the cervical spine were also generated. COMPARISON:  07/20/2019 CT FINDINGS: CT HEAD FINDINGS Brain: No acute territorial infarction, hemorrhage, or intracranial mass. Mild atrophy and small vessel ischemic changes of the white matter. Stable ventricle size Vascular: No hyperdense vessels.  Carotid vascular calcification Skull: Normal. Negative for fracture or focal lesion. Sinuses/Orbits: No acute finding. Other: None CT CERVICAL SPINE FINDINGS Alignment: Straightening of the cervical spine. Facet alignment within normal limits. Skull base and vertebrae: No acute fracture. No primary bone lesion or focal pathologic process. Soft tissues and spinal canal: No prevertebral fluid  or swelling. No visible canal hematoma. Disc levels: Post fusion changes C5 through C7. Mild degenerative changes at C3-C4, C4-C5 and C7-T1. Upper chest: Negative. Other: Subcentimeter hypodensity in the left lobe of thyroid, no change IMPRESSION: 1. No CT evidence for acute intracranial abnormality. Mild atrophy and small vessel ischemic changes of the white matter 2. Straightening of the cervical spine with post fusion changes C5 through C7. No acute osseous abnormality. Electronically Signed   By: Jasmine Pang M.D.   On: 08/19/2019 03:10   Dg Chest Portable 1 View  Result Date: 08/19/2019 CLINICAL DATA:  Altered mental status EXAM: PORTABLE CHEST 1 VIEW COMPARISON:  08/06/2019 FINDINGS: Surgical hardware in the cervical spine. Mild bronchitic changes at the bases. No focal consolidation or effusion. Normal heart size. No pneumothorax. Metallic BBs over the right lower chest. IMPRESSION: No active disease.  Mild basilar bronchitic changes Electronically Signed   By: Jasmine Pang M.D.   On: 08/19/2019 03:01    ASSESSMENT AND PLAN:   Active Problems:   Hypoglycemia  1.  Hypoglycemia with associated type 2 diabetes mellitus.   Initially kept on dextrose IV drip but blood sugar is now going up so dextrose drip stopped, still blood sugar more than 300 so I decided to start on lower dose of Lantus than home dose.(He was on 50 units daily at home, and was given 30 unit Lantus yesterday here.)   on supplemental coverage with sensitive NovoLog.  We will hold off  Metformin for now. He again had hypoglycemia episode less than 30 today morning. Give only 20 units of Lantus today. As he has a history of recurrent DKA I would like not to stop his Lantus completely.  2.  Hypertensive urgency.  The patient will be placed on as needed IV Vasotec. Start on oral amlodipine.  Better controlled now.  3.  GERD.  We will continue PPI therapy.  4.  Paroxysmal atrial fibrillation.  We will place him on  aspirin.  He is currently in normal sinus rhythm.  Chads score is 2, advised to follow with primary physician.  5.  DVT prophylaxis.  Subcutaneous Lovenox    All the records are reviewed and case discussed with Care Management/Social Workerr. Management plans discussed with the patient, family and they are in agreement.  CODE STATUS: Full code  TOTAL TIME TAKING CARE OF THIS PATIENT: 35 minutes.    POSSIBLE D/C IN 1-2 DAYS, DEPENDING ON CLINICAL CONDITION.   Altamese DillingVaibhavkumar Skyelar Halliday M.D on 08/20/2019   Between 7am to 6pm - Pager - [phone]  After 6pm go to [email or web address] - password [email or web address]  [facility]  Office  [phone]  CC: Primary care physician; Sherron Mondayejan-Sie, S Ahmed, MD  Note: This dictation was prepared with Dragon dictation along with smaller phrase technology. Any transcriptional errors that result from this process are unintentional.

## 2019-08-20 NOTE — Progress Notes (Signed)
Inpatient Diabetes Program Recommendations  AACE/ADA: New Consensus Statement on Inpatient Glycemic Control   Target Ranges:  Prepandial:   less than 140 mg/dL      Peak postprandial:   less than 180 mg/dL (1-2 hours)      Critically ill patients:  140 - 180 mg/dL  Results for PAXON, PROPES (MRN [id number]) as of 08/20/2019 07:58  Ref. Range 08/19/2019 06:25 08/19/2019 08:13 08/19/2019 11:39 08/19/2019 12:56 08/19/2019 16:33 08/19/2019 21:15 08/20/2019 07:05 08/20/2019 07:26  Glucose-Capillary Latest Ref Range: 70 - 99 mg/dL 113 (H) 111 (H) 376 (H) 334 (H) 157 (H) 319 (H) 63 (L) 101 (H)   Results for AMAREE, LEEPER (MRN [id number]) as of 08/20/2019 07:58  Ref. Range 08/20/2019 05:31  Glucose Latest Ref Range: 70 - 99 mg/dL 29 (LL)   Review of Glycemic Control  Outpatient Diabetes medications: Lantus 50 units daily, Novolog 0-30 units TID with meals, Metformin 1000 mg BID Current orders for Inpatient glycemic control: Lantus 30 units QHS, Novolog 0-9 units TID with meals, Novolog 0-5 units QHS  Inpatient Diabetes Program Recommendations:   Insulin-Basal: Lab glucose 29 mg/dlat 5:31 am today and finger stick 63 gm/dl at 7:05 am today. Patient received Lantus 30 units last night. Please consider decreasing Lantus to 20 units QHS (approximately based on 64.5 kg x 0.3 units).  Insulin Meal Coverage: Please consider ordering Novolog 3 units TID with meals for meal coverage if patient eats at least 50% of meals.  NOTE: Patient is well known to inpatient diabetes team. Patient was just inpatient 08/07/19 to 08/08/19 and was last seen by diabetes coordinator on 08/08/19. Per discharge summary on 08/08/19, patient is to call [facility] Endocrinology to set up an appointment to re-establish care with Endocrinology to assist with improving DM. Patient admitted on 08/19/19 with recurrent hypoglycemia. Recommend adjusting outpatient Lantus and Novolog to similar regimen used as an inpatient to help improve  outpatient DM control and encourage patient to re-establish care with Endocrinologist.  Noted patient cocaine positive (07/27/19, 08/07/19, and 08/19/19) which is likely effecting patient's ability and willingness to manage DM.  Thanks, Barnie Alderman, RN, MSN, CDE Diabetes Coordinator Inpatient Diabetes Program [phone] (Team Pager from 8am to 5pm)

## 2019-08-21 LAB — GLUCOSE, CAPILLARY
Glucose-Capillary: 174 mg/dL — ABNORMAL HIGH (ref 70–99)
Glucose-Capillary: 275 mg/dL — ABNORMAL HIGH (ref 70–99)

## 2019-08-21 MED ORDER — ASPIRIN EC 81 MG PO TBEC: 81.0000 mg | DELAYED_RELEASE_TABLET | Freq: Every day | ORAL | 1 refills | Status: DC

## 2019-08-21 MED ORDER — AMLODIPINE BESYLATE 10 MG PO TABS: 10.0000 mg | ORAL_TABLET | Freq: Every day | ORAL | 0 refills | Status: DC

## 2019-08-21 MED ORDER — INSULIN GLARGINE 100 UNIT/ML ~~LOC~~ SOLN: 22.0000 [IU] | Freq: Every day | SUBCUTANEOUS | 11 refills | Status: DC

## 2019-08-21 NOTE — Progress Notes (Signed)
Inpatient Diabetes Program Recommendations  AACE/ADA: New Consensus Statement on Inpatient Glycemic Control  Target Ranges:  Prepandial:   less than 140 mg/dL      Peak postprandial:   less than 180 mg/dL (1-2 hours)      Critically ill patients:  140 - 180 mg/dL   Results for Spencer [name], Spencer [name] (MRN [id number]) as of 08/21/2019 12:25  Ref. Range 08/20/2019 07:05 08/20/2019 07:26 08/20/2019 12:17 08/20/2019 16:48 08/20/2019 20:50 08/21/2019 07:46 08/21/2019 11:56  Glucose-Capillary Latest Ref Range: 70 - 99 mg/dL 63 (L) 101 (H) 219 (H) 290 (H) 151 (H) 174 (H) 275 (H)   Review of Glycemic Control  Outpatient Diabetes medications:Lantus 50 units daily, Novolog 0-30 units TID with meals, Metformin 1000 mg BID Current orders for Inpatient glycemic control:Lantus 20 units QHS, Novolog 0-9 units TID with meals, Novolog 0-5 units QHS  Inpatient Diabetes Program Recommendations:  Insulin Meal Coverage:Please consider ordering Novolog 3 units TID with meals for meal coverage if patient eats at least 50% of meals.  NOTE: Patient is well known to inpatient diabetes team. Patient was just inpatient 08/07/19 to 08/08/19 and was last seen by diabetes coordinator on 08/08/19. Per discharge summary on 08/08/19, patient is to call [facility] Endocrinology to set up an appointment to re-establish care with Endocrinology to assist with improving DM. Patient admitted on 08/19/19 with recurrent hypoglycemia. Recommend adjusting outpatient Lantus and Novolog to similar regimen used as an inpatient to help improve outpatient DM control and encourage patient to re-establish care with Endocrinologist.Noted patient cocaine positive(07/27/19, 08/07/19, and 08/19/19)whichis likelyeffecting patient's ability and willingness to manage DM.  Thanks, Barnie Alderman, RN, MSN, CDE Diabetes Coordinator Inpatient Diabetes Program [phone] (Team Pager from 8am to 5pm)

## 2019-08-21 NOTE — Discharge Summary (Signed)
[place] [facility] [place] NAME: Spencer [name]    MR#:  [id number]  DATE OF BIRTH:  1961/10/18  DATE OF ADMISSION:  08/19/2019 ADMITTING PHYSICIAN: Christel Mormon, MD  DATE OF DISCHARGE: 08/21/2019   PRIMARY CARE PHYSICIAN: Jodi Marble, MD    ADMISSION DIAGNOSIS:  Hypoglycemia [E16.2]  DISCHARGE DIAGNOSIS:  Active Problems:   Hypoglycemia   SECONDARY DIAGNOSIS:   Past Medical History:  Diagnosis Date  . Brain aneurysm   . Broken bones    clavical, ankle, arms toes wriast   . Diabetes mellitus without complication ([place])   . Dysrhythmia   . GERD (gastroesophageal reflux disease)   . Hypertension   . Substance abuse ([place])     HOSPITAL COURSE:   1. Hypoglycemia with associated type 2 diabetes mellitus.  Initially kept on dextrose IV drip but blood sugar was going up so dextrose drip stopped, still blood sugar more than 300 so I decided to start on lower dose of Lantus than home dose.(He was on 50 units daily at home, and was given 30 unit Lantus yesterday here.)   on supplemental coverage with sensitive NovoLog. We will hold off  Metformin for now. He again had hypoglycemia episode less than 30 next day morning. Give only 20 units of Lantus, we did that his blood sugar remained stable the next day in the range of 100-2 50 . as he has a history of recurrent DKA- I would like not to stop his Lantus completely. I have encouraged him about having control on his diet without that his blood sugar sometimes could go very high and if he give higher dose of Lantus then he can have hypoglycemia again.  He understands that and agree to follow with diet.  2. Hypertensive urgency. The patient will be placed on as needed IV Vasotec. Start on oral amlodipine.  Better controlled now.  3. GERD. We will continue PPI therapy.  4. Paroxysmal atrial fibrillation. We will place him on aspirin. He is currently in normal sinus rhythm.   Chads score is 2, advised to follow with primary physician.  5. DVT prophylaxis. Subcutaneous Lovenox   DISCHARGE CONDITIONS:   Stable.  CONSULTS OBTAINED:    DRUG ALLERGIES:  No Known Allergies  DISCHARGE MEDICATIONS:   Allergies as of 08/21/2019   No Known Allergies     Medication List    TAKE these medications   amLODipine 10 MG tablet Commonly known as: NORVASC Take 1 tablet (10 mg total) by mouth daily. Start taking on: August 22, 2019   aspirin EC 81 MG tablet Take 1 tablet (81 mg total) by mouth daily.   blood glucose meter kit and supplies Check blood sugar 4 times daily   insulin glargine 100 UNIT/ML injection Commonly known as: LANTUS Inject 0.22 mLs (22 Units total) into the skin daily. What changed: how much to take   Insulin Pen Needle 31G X 5 MM Misc Check blood sugars 4 times daily   metFORMIN 1000 MG tablet Commonly known as: GLUCOPHAGE Take 1,000 mg by mouth 2 (two) times daily with a meal.   multivitamin with minerals Tabs tablet Take 1 tablet by mouth daily.   NovoLOG FlexPen 100 UNIT/ML FlexPen Generic drug: insulin aspart Inject 0-30 Units into the skin 3 (three) times daily.   pantoprazole 40 MG tablet Commonly known as: PROTONIX Take 1 tablet (40 mg total) by mouth daily.        DISCHARGE INSTRUCTIONS:  Monitor diet and follow with PMD in 1 week.  If you experience worsening of your admission symptoms, develop shortness of breath, life threatening emergency, suicidal or homicidal thoughts you must seek medical attention immediately by calling 911 or calling your MD immediately  if symptoms less severe.  You Must read complete instructions/literature along with all the possible adverse reactions/side effects for all the Medicines you take and that have been prescribed to you. Take any new Medicines after you have completely understood and accept all the possible adverse reactions/side effects.   Please note  You were  cared for by a hospitalist during your hospital stay. If you have any questions about your discharge medications or the care you received while you were in the hospital after you are discharged, you can call the unit and asked to speak with the hospitalist on call if the hospitalist that took care of you is not available. Once you are discharged, your primary care physician will handle any further medical issues. Please note that NO REFILLS for any discharge medications will be authorized once you are discharged, as it is imperative that you return to your primary care physician (or establish a relationship with a primary care physician if you do not have one) for your aftercare needs so that they can reassess your need for medications and monitor your lab values.    Today   CHIEF COMPLAINT:   Chief Complaint  Patient presents with  . Hypoglycemia    HISTORY OF PRESENT ILLNESS:  Spencer [name]  is a 58 y.o. male brought to the ED due to altered mental status and severe nausea and vomiting for the past couple of days.  He is unable to contribute any further details to his HPI.  Here he is found to be in DKA, with severe electrolyte abnormalities.  His glucose is around 1100, pH 6.9, potassium greater than 7, bicarb undetectable.  He is also found to be in A. fib with RVR which is a new diagnosis for him.  Hospitalist were called for admission    VITAL SIGNS:  Blood pressure 117/86, pulse 81, temperature 98.2 F (36.8 C), temperature source Oral, resp. rate 18, height 5' 10"  (1.778 m), weight 64.5 kg, SpO2 99 %.  I/O:    Intake/Output Summary (Last 24 hours) at 08/21/2019 1301 Last data filed at 08/21/2019 1218 Gross per 24 hour  Intake 1080 ml  Output 1 ml  Net 1079 ml    PHYSICAL EXAMINATION:  GENERAL:  58 y.o.-year-old patient lying in the bed with no acute distress.  EYES: Pupils equal, round, reactive to light and accommodation. No scleral icterus. Extraocular muscles intact.   HEENT: Head atraumatic, normocephalic. Oropharynx and nasopharynx clear.  NECK:  Supple, no jugular venous distention. No thyroid enlargement, no tenderness.  LUNGS: Normal breath sounds bilaterally, no wheezing, rales,rhonchi or crepitation. No use of accessory muscles of respiration.  CARDIOVASCULAR: S1, S2 normal. No murmurs, rubs, or gallops.  ABDOMEN: Soft, non-tender, non-distended. Bowel sounds present. No organomegaly or mass.  EXTREMITIES: No pedal edema, cyanosis, or clubbing.  NEUROLOGIC: Cranial nerves II through XII are intact. Muscle strength 5/5 in all extremities. Sensation intact. Gait not checked.  PSYCHIATRIC: The patient is alert and oriented x 3.  SKIN: No obvious rash, lesion, or ulcer.   DATA REVIEW:   CBC Recent Labs  Lab 08/19/19 0203  WBC 9.9  HGB 11.8*  HCT 36.1*  PLT 354    Chemistries  Recent Labs  Lab 08/20/19 0531  NA 139  K 3.8  CL 107  CO2 25  GLUCOSE 29*  BUN 21*  CREATININE 0.66  CALCIUM 8.8*  AST 20  ALT 15  ALKPHOS 81  BILITOT 0.4    Cardiac Enzymes No results for input(s): TROPONINI in the last 168 hours.  Microbiology Results  Results for orders placed or performed during the hospital encounter of 08/19/19  SARS CORONAVIRUS 2 (TAT 6-24 HRS) Nasopharyngeal Nasopharyngeal Swab     Status: None   Collection Time: 08/19/19  2:03 AM   Specimen: Nasopharyngeal Swab  Result Value Ref Range Status   SARS Coronavirus 2 NEGATIVE NEGATIVE Final    Comment: (NOTE) SARS-CoV-2 target nucleic acids are NOT DETECTED. The SARS-CoV-2 RNA is generally detectable in upper and lower respiratory specimens during the acute phase of infection. Negative results do not preclude SARS-CoV-2 infection, do not rule out co-infections with other pathogens, and should not be used as the sole basis for treatment or other patient management decisions. Negative results must be combined with clinical observations, patient history, and epidemiological  information. The expected result is Negative. Fact Sheet for Patients: SugarRoll.be Fact Sheet for Healthcare Providers: https://www.woods-mathews.com/ This test is not yet approved or cleared by the [place] FDA and  has been authorized for detection and/or diagnosis of SARS-CoV-2 by FDA under an Emergency Use Authorization (EUA). This EUA will remain  in effect (meaning this test can be used) for the duration of the COVID-19 declaration under Section 56 4(b)(1) of the Act, 21 U.S.C. section 360bbb-3(b)(1), unless the authorization is terminated or revoked sooner. Performed at [facility], [place] [address], [place], [place] [address]     RADIOLOGY:  No results found.  EKG:   Orders placed or performed during the hospital encounter of 08/19/19  . EKG 12-Lead  . EKG 12-Lead      Management plans discussed with the patient, family and they are in agreement.  CODE STATUS:     Code Status Orders  (From admission, onward)         Start     Ordered   08/19/19 0549  Full code  Continuous     08/19/19 0551        Code Status History    Date Active Date Inactive Code Status Order ID Comments User Context   08/07/2019 0221 08/08/2019 1802 Full Code [id number]  Lance Coon, MD ED   07/27/2019 1922 07/29/2019 2245 Full Code [id number]  Otila Back, MD ED   06/09/2019 2125 06/12/2019 2007 Full Code [id number]  Mayer Camel, NP ED   05/28/2019 1845 06/04/2019 2148 Full Code [id number]  Epifanio Lesches, MD ED   02/19/2019 0122 02/19/2019 1901 Full Code [id number]  Lance Coon, MD Inpatient   02/08/2019 0130 02/13/2019 1546 Full Code [id number]  Harrie Foreman, MD ED   01/17/2019 0412 01/18/2019 1903 Full Code [id number]  Mansy, Arvella Merles, MD ED   01/06/2019 1729 01/08/2019 1427 Full Code [id number]  Loletha Grayer, MD ED   10/25/2018 1356 10/27/2018 1345 Full Code [id number]  Dustin Flock, MD Inpatient   10/23/2018 1828 10/24/2018 1759  Full Code [id number]  Dustin Flock, MD Inpatient   01/15/2017 1417 01/18/2017 1716 Full Code [id number]  Gladstone Lighter, MD Inpatient   11/06/2016 0319 11/09/2016 1508 Full Code [id number]  Vickii Chafe Corine Shelter, MD Inpatient   11/06/2016 0229 11/06/2016 0319 Full Code [id number]  de Flo Shanks, MD Inpatient   04/04/2015 0008 04/06/2015 1418 Full Code [id number]  Shanon Brow,  Hollie Salk, MD Inpatient   Advance Care Planning Activity      TOTAL TIME TAKING CARE OF THIS PATIENT: 35 minutes.    Vaughan Basta M.D on 08/21/2019 at 1:01 PM  Between 7am to 6pm - Pager - [phone]  After 6pm go to [email or web address] - password [email or web address] [place] [facility]  Office  [phone]  CC: Primary care physician; Jodi Marble, MD   Note: This dictation was prepared with Dragon dictation along with smaller phrase technology. Any transcriptional errors that result from this process are unintentional.

## 2019-08-21 NOTE — TOC Transition Note (Signed)
Transition of Care [facility]) - CM/SW Discharge Note   Patient Details  Name: Spencer [name] MRN: [id number] Date of Birth: 05/07/1961  [facility] CM/SW Contact:  Candie Chroman, LCSW Phone Number: 08/21/2019, 2:20 PM   Clinical Narrative: Readmission prevention screen complete. CSW met with patient. No supports at bedside. CSW introduced role and explained that discharge planning would be discussed. Patient is not active with his previous PCP and wants a new one. No issues affording meds. No home health/DME prior to admission. Patient drives himself. No further concerns. Patient has orders to discharge home today. CSW signing off.     Final next level of care: Home/Self Care Barriers to Discharge: Barriers Resolved   Patient Goals and CMS Choice     Choice offered to / list presented to : NA  Discharge Placement                       Discharge Plan and Services     Post Acute Care Choice: NA                               Social Determinants of Health (SDOH) Interventions     Readmission Risk Interventions Readmission Risk Prevention Plan 08/21/2019 02/19/2019 02/13/2019  Transportation Screening Complete Complete Complete  PCP or Specialist Appt within 3-5 Days - Complete -  HRI or Home Care Consult - Not Complete -  HRI or Home Care Consult comments - Patient does not meet homebound status -  Palliative Care Screening - Not Applicable -  Medication Review (RN Care Manager) Complete Complete Complete  PCP or Specialist appointment within 3-5 days of discharge - - (No Data)  [place] or [place] [facility] Care/Counseling Consult Complete - -  SW Consult Not Complete Comments - - -  Palliative Care Screening Not Applicable - -  [place] Not Applicable - -  Some recent data might be hidden

## 2019-09-04 ENCOUNTER — Other Ambulatory Visit: Payer: Self-pay

## 2019-09-04 ENCOUNTER — Inpatient Hospital Stay
Admission: EM | Admit: 2019-09-04 | Discharge: 2019-09-10 | DRG: 637 | Disposition: A | Discharge status: Home / Self Care | Payer: Medicaid Other | Attending: Family Medicine | Admitting: Family Medicine

## 2019-09-04 ENCOUNTER — Encounter: Payer: Self-pay | Admitting: Emergency Medicine

## 2019-09-04 ENCOUNTER — Encounter
Admission: EM | Disposition: A | Discharge status: Home / Self Care | Payer: Self-pay | Source: Home / Self Care | Attending: Internal Medicine

## 2019-09-04 ENCOUNTER — Inpatient Hospital Stay: Payer: Medicaid Other

## 2019-09-04 ENCOUNTER — Emergency Department: Payer: Medicaid Other

## 2019-09-04 DIAGNOSIS — F05 Delirium due to known physiological condition: Secondary | ICD-10-CM | POA: Diagnosis not present

## 2019-09-04 DIAGNOSIS — N179 Acute kidney failure, unspecified: Secondary | ICD-10-CM | POA: Diagnosis present

## 2019-09-04 DIAGNOSIS — E870 Hyperosmolality and hypernatremia: Secondary | ICD-10-CM | POA: Diagnosis not present

## 2019-09-04 DIAGNOSIS — Z716 Tobacco abuse counseling: Secondary | ICD-10-CM | POA: Diagnosis not present

## 2019-09-04 DIAGNOSIS — I248 Other forms of acute ischemic heart disease: Secondary | ICD-10-CM | POA: Diagnosis present

## 2019-09-04 DIAGNOSIS — E0811 Diabetes mellitus due to underlying condition with ketoacidosis with coma: Secondary | ICD-10-CM | POA: Diagnosis not present

## 2019-09-04 DIAGNOSIS — J189 Pneumonia, unspecified organism: Secondary | ICD-10-CM | POA: Diagnosis present

## 2019-09-04 DIAGNOSIS — E0810 Diabetes mellitus due to underlying condition with ketoacidosis without coma: Secondary | ICD-10-CM | POA: Diagnosis not present

## 2019-09-04 DIAGNOSIS — Z7982 Long term (current) use of aspirin: Secondary | ICD-10-CM

## 2019-09-04 DIAGNOSIS — Z8249 Family history of ischemic heart disease and other diseases of the circulatory system: Secondary | ICD-10-CM | POA: Diagnosis not present

## 2019-09-04 DIAGNOSIS — I10 Essential (primary) hypertension: Secondary | ICD-10-CM | POA: Diagnosis present

## 2019-09-04 DIAGNOSIS — F10188 Alcohol abuse with other alcohol-induced disorder: Secondary | ICD-10-CM | POA: Diagnosis present

## 2019-09-04 DIAGNOSIS — I959 Hypotension, unspecified: Secondary | ICD-10-CM | POA: Diagnosis present

## 2019-09-04 DIAGNOSIS — E875 Hyperkalemia: Secondary | ICD-10-CM | POA: Diagnosis present

## 2019-09-04 DIAGNOSIS — F1910 Other psychoactive substance abuse, uncomplicated: Secondary | ICD-10-CM | POA: Diagnosis not present

## 2019-09-04 DIAGNOSIS — E876 Hypokalemia: Secondary | ICD-10-CM | POA: Diagnosis not present

## 2019-09-04 DIAGNOSIS — G9341 Metabolic encephalopathy: Secondary | ICD-10-CM | POA: Diagnosis present

## 2019-09-04 DIAGNOSIS — Z794 Long term (current) use of insulin: Secondary | ICD-10-CM

## 2019-09-04 DIAGNOSIS — K219 Gastro-esophageal reflux disease without esophagitis: Secondary | ICD-10-CM | POA: Diagnosis present

## 2019-09-04 DIAGNOSIS — Z8679 Personal history of other diseases of the circulatory system: Secondary | ICD-10-CM

## 2019-09-04 DIAGNOSIS — K5900 Constipation, unspecified: Secondary | ICD-10-CM | POA: Diagnosis present

## 2019-09-04 DIAGNOSIS — Z20828 Contact with and (suspected) exposure to other viral communicable diseases: Secondary | ICD-10-CM | POA: Diagnosis present

## 2019-09-04 DIAGNOSIS — Z452 Encounter for adjustment and management of vascular access device: Secondary | ICD-10-CM

## 2019-09-04 DIAGNOSIS — Z9114 Patient's other noncompliance with medication regimen: Secondary | ICD-10-CM | POA: Diagnosis not present

## 2019-09-04 DIAGNOSIS — F1490 Cocaine use, unspecified, uncomplicated: Secondary | ICD-10-CM | POA: Diagnosis present

## 2019-09-04 DIAGNOSIS — X58XXXD Exposure to other specified factors, subsequent encounter: Secondary | ICD-10-CM | POA: Diagnosis present

## 2019-09-04 DIAGNOSIS — I4891 Unspecified atrial fibrillation: Secondary | ICD-10-CM | POA: Diagnosis present

## 2019-09-04 DIAGNOSIS — J9602 Acute respiratory failure with hypercapnia: Secondary | ICD-10-CM | POA: Diagnosis present

## 2019-09-04 DIAGNOSIS — Z681 Body mass index (BMI) 19 or less, adult: Secondary | ICD-10-CM

## 2019-09-04 DIAGNOSIS — Z79899 Other long term (current) drug therapy: Secondary | ICD-10-CM

## 2019-09-04 DIAGNOSIS — E1110 Type 2 diabetes mellitus with ketoacidosis without coma: Secondary | ICD-10-CM | POA: Diagnosis present

## 2019-09-04 DIAGNOSIS — J9601 Acute respiratory failure with hypoxia: Secondary | ICD-10-CM | POA: Diagnosis present

## 2019-09-04 DIAGNOSIS — F17200 Nicotine dependence, unspecified, uncomplicated: Secondary | ICD-10-CM | POA: Diagnosis present

## 2019-09-04 DIAGNOSIS — S2242XD Multiple fractures of ribs, left side, subsequent encounter for fracture with routine healing: Secondary | ICD-10-CM

## 2019-09-04 DIAGNOSIS — R451 Restlessness and agitation: Secondary | ICD-10-CM | POA: Diagnosis present

## 2019-09-04 DIAGNOSIS — E1111 Type 2 diabetes mellitus with ketoacidosis with coma: Principal | ICD-10-CM

## 2019-09-04 DIAGNOSIS — Z4659 Encounter for fitting and adjustment of other gastrointestinal appliance and device: Secondary | ICD-10-CM

## 2019-09-04 DIAGNOSIS — G9340 Encephalopathy, unspecified: Secondary | ICD-10-CM | POA: Diagnosis not present

## 2019-09-04 DIAGNOSIS — I2102 ST elevation (STEMI) myocardial infarction involving left anterior descending coronary artery: Secondary | ICD-10-CM

## 2019-09-04 DIAGNOSIS — E1310 Other specified diabetes mellitus with ketoacidosis without coma: Secondary | ICD-10-CM | POA: Diagnosis not present

## 2019-09-04 HISTORY — PX: LEFT HEART CATH AND CORONARY ANGIOGRAPHY: CATH118249

## 2019-09-04 HISTORY — PX: CORONARY/GRAFT ACUTE MI REVASCULARIZATION: CATH118305

## 2019-09-04 LAB — CBC WITH DIFFERENTIAL/PLATELET
Abs Immature Granulocytes: 1.38 10*3/uL — ABNORMAL HIGH (ref 0.00–0.07)
Basophils Absolute: 0.1 10*3/uL (ref 0.0–0.1)
Basophils Relative: 0 %
Eosinophils Absolute: 0 10*3/uL (ref 0.0–0.5)
Eosinophils Relative: 0 %
HCT: 46.6 % (ref 39.0–52.0)
Hemoglobin: 12.7 g/dL — ABNORMAL LOW (ref 13.0–17.0)
Immature Granulocytes: 5 %
Lymphocytes Relative: 9 %
Lymphs Abs: 2.4 10*3/uL (ref 0.7–4.0)
MCH: 32 pg (ref 26.0–34.0)
MCHC: 27.3 g/dL — ABNORMAL LOW (ref 30.0–36.0)
MCV: 117.4 fL — ABNORMAL HIGH (ref 80.0–100.0)
Monocytes Absolute: 2 10*3/uL — ABNORMAL HIGH (ref 0.1–1.0)
Monocytes Relative: 7 %
Neutro Abs: 21.3 10*3/uL — ABNORMAL HIGH (ref 1.7–7.7)
Neutrophils Relative %: 79 %
Platelets: 345 10*3/uL (ref 150–400)
RBC: 3.97 MIL/uL — ABNORMAL LOW (ref 4.22–5.81)
RDW: 13.6 % (ref 11.5–15.5)
WBC: 26.9 10*3/uL — ABNORMAL HIGH (ref 4.0–10.5)
nRBC: 0 % (ref 0.0–0.2)

## 2019-09-04 LAB — ACETAMINOPHEN LEVEL: Acetaminophen (Tylenol), Serum: <10 ug/mL — ABNORMAL LOW (ref 10–30)

## 2019-09-04 LAB — BLOOD GAS, VENOUS
Acid-base deficit: 27.3 mmol/L — ABNORMAL HIGH (ref 0.0–2.0)
Bicarbonate: 3.8 mmol/L — ABNORMAL LOW (ref 20.0–28.0)
O2 Saturation: 59.3 %
Patient temperature: 37
pCO2, Ven: <19 mmHg — CL (ref 44.0–60.0)
pH, Ven: 6.93 — CL (ref 7.250–7.430)
pO2, Ven: 53 mmHg — ABNORMAL HIGH (ref 32.0–45.0)

## 2019-09-04 LAB — COMPREHENSIVE METABOLIC PANEL
ALT: 14 U/L (ref 0–44)
AST: 14 U/L — ABNORMAL LOW (ref 15–41)
Albumin: 4.4 g/dL (ref 3.5–5.0)
Alkaline Phosphatase: 136 U/L — ABNORMAL HIGH (ref 38–126)
Anion gap: 43 — ABNORMAL HIGH (ref 5–15)
BUN: 97 mg/dL — ABNORMAL HIGH (ref 6–20)
CO2: 9 mmol/L — ABNORMAL LOW (ref 22–32)
Calcium: 8.5 mg/dL — ABNORMAL LOW (ref 8.9–10.3)
Chloride: 83 mmol/L — ABNORMAL LOW (ref 98–111)
Creatinine, Ser: 6.02 mg/dL — ABNORMAL HIGH (ref 0.61–1.24)
GFR calc Af Amer: 11 mL/min — ABNORMAL LOW (ref >60)
GFR calc non Af Amer: 9 mL/min — ABNORMAL LOW (ref >60)
Glucose, Bld: 1689 mg/dL (ref 70–99)
Potassium: >7.5 mmol/L (ref 3.5–5.1)
Sodium: 135 mmol/L (ref 135–145)
Total Bilirubin: 2.4 mg/dL — ABNORMAL HIGH (ref 0.3–1.2)
Total Protein: 7.1 g/dL (ref 6.5–8.1)

## 2019-09-04 LAB — GLUCOSE, CAPILLARY
Glucose-Capillary: >600 mg/dL (ref 70–99)
Glucose-Capillary: >600 mg/dL (ref 70–99)
Glucose-Capillary: >600 mg/dL (ref 70–99)
Glucose-Capillary: >600 mg/dL (ref 70–99)
Glucose-Capillary: >600 mg/dL (ref 70–99)
Glucose-Capillary: >600 mg/dL (ref 70–99)
Glucose-Capillary: >600 mg/dL (ref 70–99)
Glucose-Capillary: >600 mg/dL (ref 70–99)

## 2019-09-04 LAB — BLOOD GAS, ARTERIAL
Acid-base deficit: 26.3 mmol/L — ABNORMAL HIGH (ref 0.0–2.0)
Acid-base deficit: 21 mmol/L — ABNORMAL HIGH (ref 0.0–2.0)
Bicarbonate: 3.2 mmol/L — ABNORMAL LOW (ref 20.0–28.0)
Bicarbonate: 6.5 mmol/L — ABNORMAL LOW (ref 20.0–28.0)
FIO2: 1
FIO2: 0.21
O2 Saturation: 99.7 %
O2 Saturation: 95 %
Patient temperature: 37
Patient temperature: 37
pCO2 arterial: <19 mmHg — CL (ref 32.0–48.0)
pCO2 arterial: 20 mmHg — ABNORMAL LOW (ref 32.0–48.0)
pH, Arterial: 7 — CL (ref 7.350–7.450)
pH, Arterial: 7.12 — CL (ref 7.350–7.450)
pO2, Arterial: 256 mmHg — ABNORMAL HIGH (ref 83.0–108.0)
pO2, Arterial: 100 mmHg (ref 83.0–108.0)

## 2019-09-04 LAB — SALICYLATE LEVEL: Salicylate Lvl: <7 mg/dL (ref 2.8–30.0)

## 2019-09-04 LAB — BASIC METABOLIC PANEL
Anion gap: 32 — ABNORMAL HIGH (ref 5–15)
BUN: 86 mg/dL — ABNORMAL HIGH (ref 6–20)
CO2: 9 mmol/L — ABNORMAL LOW (ref 22–32)
Calcium: 7.1 mg/dL — ABNORMAL LOW (ref 8.9–10.3)
Chloride: 108 mmol/L (ref 98–111)
Creatinine, Ser: 5.04 mg/dL — ABNORMAL HIGH (ref 0.61–1.24)
GFR calc Af Amer: 13 mL/min — ABNORMAL LOW (ref >60)
GFR calc non Af Amer: 12 mL/min — ABNORMAL LOW (ref >60)
Glucose, Bld: 1153 mg/dL (ref 70–99)
Potassium: 3.6 mmol/L (ref 3.5–5.1)
Sodium: 149 mmol/L — ABNORMAL HIGH (ref 135–145)

## 2019-09-04 LAB — ETHANOL: Alcohol, Ethyl (B): <10 mg/dL (ref <10)

## 2019-09-04 LAB — TROPONIN I (HIGH SENSITIVITY)
Troponin I (High Sensitivity): 29 ng/L — ABNORMAL HIGH (ref <18)
Troponin I (High Sensitivity): 48 ng/L — ABNORMAL HIGH (ref <18)

## 2019-09-04 LAB — LACTIC ACID, PLASMA
Lactic Acid, Venous: 3.6 mmol/L (ref 0.5–1.9)
Lactic Acid, Venous: 1.5 mmol/L (ref 0.5–1.9)

## 2019-09-04 LAB — SARS CORONAVIRUS 2 BY RT PCR (HOSPITAL ORDER, PERFORMED IN ~~LOC~~ HOSPITAL LAB): SARS Coronavirus 2: NEGATIVE

## 2019-09-04 LAB — PROTIME-INR
INR: 1 (ref 0.8–1.2)
Prothrombin Time: 13.3 seconds (ref 11.4–15.2)

## 2019-09-04 SURGERY — CORONARY/GRAFT ACUTE MI REVASCULARIZATION
Anesthesia: Moderate Sedation

## 2019-09-04 MED ORDER — BIVALIRUDIN TRIFLUOROACETATE 250 MG IV SOLR
INTRAVENOUS | Status: AC
  Filled 2019-09-04: qty 250

## 2019-09-04 MED ORDER — ENOXAPARIN SODIUM 30 MG/0.3ML ~~LOC~~ SOLN
30.0000 mg | SUBCUTANEOUS | Status: DC
  Administered 2019-09-05 – 2019-09-06 (×2): 30 mg via SUBCUTANEOUS
  Filled 2019-09-04 (×2): qty 0.3

## 2019-09-04 MED ORDER — SODIUM CHLORIDE 0.9 % IV BOLUS
2000.0000 mL | Freq: Once | INTRAVENOUS | Status: AC
  Administered 2019-09-04: 2000 mL via INTRAVENOUS

## 2019-09-04 MED ORDER — INSULIN REGULAR(HUMAN) IN NACL 100-0.9 UT/100ML-% IV SOLN
INTRAVENOUS | Status: DC
  Administered 2019-09-04: 23:00:00 10.8 [IU]/h via INTRAVENOUS
  Administered 2019-09-04: 5.4 [IU]/h via INTRAVENOUS
  Filled 2019-09-04 (×2): qty 100

## 2019-09-04 MED ORDER — ASPIRIN 300 MG RE SUPP
300.0000 mg | Freq: Once | RECTAL | Status: AC
  Administered 2019-09-04: 300 mg via RECTAL
  Filled 2019-09-04: qty 1

## 2019-09-04 MED ORDER — MIDAZOLAM HCL 2 MG/2ML IJ SOLN
INTRAMUSCULAR | Status: DC | PRN
  Administered 2019-09-04 (×3): 1 mg via INTRAVENOUS

## 2019-09-04 MED ORDER — LORAZEPAM 2 MG/ML IJ SOLN
1.0000 mg | INTRAMUSCULAR | Status: DC | PRN
  Administered 2019-09-05 – 2019-09-06 (×3): 2 mg via INTRAVENOUS
  Filled 2019-09-04 (×3): qty 1

## 2019-09-04 MED ORDER — MIDAZOLAM HCL 2 MG/2ML IJ SOLN
INTRAMUSCULAR | Status: AC
  Administered 2019-09-04: 4 mg via INTRAVENOUS
  Filled 2019-09-04: qty 4

## 2019-09-04 MED ORDER — SODIUM CHLORIDE 0.9% FLUSH: 3.0000 mL | INTRAVENOUS | Status: DC | PRN

## 2019-09-04 MED ORDER — LORAZEPAM 2 MG/ML IJ SOLN
INTRAMUSCULAR | Status: AC
  Administered 2019-09-04: 2 mg via INTRAVENOUS
  Filled 2019-09-04: qty 1

## 2019-09-04 MED ORDER — MIDAZOLAM HCL 2 MG/2ML IJ SOLN
INTRAMUSCULAR | Status: AC
  Filled 2019-09-04: qty 2

## 2019-09-04 MED ORDER — DEXMEDETOMIDINE BOLUS VIA INFUSION
1.0000 ug/kg | Freq: Once | INTRAVENOUS | Status: AC
  Administered 2019-09-04: 64.5 ug via INTRAVENOUS
  Filled 2019-09-04: qty 65

## 2019-09-04 MED ORDER — FENTANYL CITRATE (PF) 100 MCG/2ML IJ SOLN
INTRAMUSCULAR | Status: AC
  Filled 2019-09-04: qty 2

## 2019-09-04 MED ORDER — IOHEXOL 300 MG/ML  SOLN
INTRAMUSCULAR | Status: DC | PRN
  Administered 2019-09-04: 90 mL

## 2019-09-04 MED ORDER — HYDRALAZINE HCL 20 MG/ML IJ SOLN: 10.0000 mg | INTRAMUSCULAR | Status: AC | PRN

## 2019-09-04 MED ORDER — SODIUM BICARBONATE 8.4 % IV SOLN
INTRAVENOUS | Status: AC
  Administered 2019-09-04: 19:00:00 150 meq via INTRAVENOUS
  Filled 2019-09-04: qty 50

## 2019-09-04 MED ORDER — NOREPINEPHRINE 4 MG/250ML-% IV SOLN
0.0000 ug/min | INTRAVENOUS | Status: DC
  Administered 2019-09-04: 20:00:00 4 ug/min via INTRAVENOUS
  Administered 2019-09-05: 12 ug/min via INTRAVENOUS
  Administered 2019-09-05: 8 ug/min via INTRAVENOUS
  Filled 2019-09-04 (×3): qty 250

## 2019-09-04 MED ORDER — HEPARIN (PORCINE) IN NACL 2000-0.9 UNIT/L-% IV SOLN
INTRAVENOUS | Status: DC | PRN
  Administered 2019-09-04: 1000 mL

## 2019-09-04 MED ORDER — SODIUM CHLORIDE 0.9 % IV BOLUS
1000.0000 mL | Freq: Once | INTRAVENOUS | Status: AC
  Administered 2019-09-04: 1000 mL via INTRAVENOUS

## 2019-09-04 MED ORDER — CALCIUM GLUCONATE-NACL 1-0.675 GM/50ML-% IV SOLN
1.0000 g | INTRAVENOUS | Status: AC
  Administered 2019-09-04: 1000 mg via INTRAVENOUS
  Filled 2019-09-04: qty 50

## 2019-09-04 MED ORDER — HEPARIN (PORCINE) IN NACL 1000-0.9 UT/500ML-% IV SOLN
INTRAVENOUS | Status: AC
  Filled 2019-09-04: qty 1000

## 2019-09-04 MED ORDER — SODIUM CHLORIDE 0.9 % IV SOLN
3.0000 g | INTRAVENOUS | Status: DC
  Administered 2019-09-04: 3 g via INTRAVENOUS
  Filled 2019-09-04: qty 3
  Filled 2019-09-04: qty 8

## 2019-09-04 MED ORDER — ACETAMINOPHEN 325 MG PO TABS: 650.0000 mg | ORAL_TABLET | ORAL | Status: DC | PRN

## 2019-09-04 MED ORDER — FENTANYL CITRATE (PF) 100 MCG/2ML IJ SOLN
INTRAMUSCULAR | Status: DC | PRN
  Administered 2019-09-04: 25 ug via INTRAVENOUS

## 2019-09-04 MED ORDER — DEXMEDETOMIDINE HCL IN NACL 400 MCG/100ML IV SOLN
0.2000 ug/kg/h | INTRAVENOUS | Status: DC
  Administered 2019-09-04: 0.7 ug/kg/h via INTRAVENOUS
  Administered 2019-09-04: 0.6 ug/kg/h via INTRAVENOUS
  Administered 2019-09-05 (×2): 0.7 ug/kg/h via INTRAVENOUS
  Administered 2019-09-06 – 2019-09-07 (×3): 0.5 ug/kg/h via INTRAVENOUS
  Administered 2019-09-08: 0.4 ug/kg/h via INTRAVENOUS
  Filled 2019-09-04 (×7): qty 100

## 2019-09-04 MED ORDER — SODIUM CHLORIDE 0.9 % IV SOLN
INTRAVENOUS | Status: AC
  Administered 2019-09-04: 18:00:00 via INTRAVENOUS

## 2019-09-04 MED ORDER — MIDAZOLAM HCL 2 MG/2ML IJ SOLN
4.0000 mg | Freq: Once | INTRAMUSCULAR | Status: AC
  Administered 2019-09-04: 19:00:00 4 mg via INTRAVENOUS

## 2019-09-04 MED ORDER — SODIUM BICARBONATE 8.4 % IV SOLN
50.0000 meq | Freq: Once | INTRAVENOUS | Status: AC
  Administered 2019-09-04: 18:00:00 50 meq via INTRAVENOUS

## 2019-09-04 MED ORDER — SODIUM CHLORIDE 0.9 % IV SOLN
250.0000 mL | INTRAVENOUS | Status: DC | PRN
  Administered 2019-09-08 – 2019-09-09 (×2): 250 mL via INTRAVENOUS

## 2019-09-04 MED ORDER — INSULIN ASPART 100 UNIT/ML IV SOLN
15.0000 [IU] | Freq: Once | INTRAVENOUS | Status: AC
  Administered 2019-09-04: 15 [IU] via INTRAVENOUS
  Filled 2019-09-04: qty 0.15

## 2019-09-04 MED ORDER — SODIUM CHLORIDE 0.9 % WEIGHT BASED INFUSION: 1.0000 mL/kg/h | INTRAVENOUS | Status: AC

## 2019-09-04 MED ORDER — ASPIRIN 81 MG PO CHEW: 81.0000 mg | CHEWABLE_TABLET | Freq: Every day | ORAL | Status: DC

## 2019-09-04 MED ORDER — ADULT MULTIVITAMIN W/MINERALS CH
1.0000 | ORAL_TABLET | Freq: Every day | ORAL | Status: DC
  Administered 2019-09-06 – 2019-09-10 (×5): 1 via ORAL
  Filled 2019-09-04 (×6): qty 1

## 2019-09-04 MED ORDER — ASPIRIN EC 81 MG PO TBEC: 81.0000 mg | DELAYED_RELEASE_TABLET | Freq: Every day | ORAL | Status: DC

## 2019-09-04 MED ORDER — SODIUM CHLORIDE 0.9 % IV SOLN
INTRAVENOUS | Status: DC
  Administered 2019-09-04: 100 mL/h via INTRAVENOUS

## 2019-09-04 MED ORDER — NOREPINEPHRINE 4 MG/250ML-% IV SOLN
INTRAVENOUS | Status: AC
  Administered 2019-09-04: 4 mg
  Filled 2019-09-04: qty 250

## 2019-09-04 MED ORDER — CLOPIDOGREL BISULFATE 75 MG PO TABS
ORAL_TABLET | ORAL | Status: AC
  Filled 2019-09-04: qty 8

## 2019-09-04 MED ORDER — STERILE WATER FOR INJECTION IV SOLN
INTRAVENOUS | Status: DC
  Administered 2019-09-04 – 2019-09-05 (×2): via INTRAVENOUS
  Filled 2019-09-04 (×4): qty 850

## 2019-09-04 MED ORDER — THIAMINE HCL 100 MG/ML IJ SOLN
100.0000 mg | Freq: Every day | INTRAMUSCULAR | Status: DC
  Administered 2019-09-05 – 2019-09-07 (×3): 100 mg via INTRAVENOUS
  Filled 2019-09-04 (×4): qty 2

## 2019-09-04 MED ORDER — ENOXAPARIN SODIUM 40 MG/0.4ML ~~LOC~~ SOLN: 40.0000 mg | SUBCUTANEOUS | Status: DC

## 2019-09-04 MED ORDER — LORAZEPAM 2 MG/ML IJ SOLN
2.0000 mg | Freq: Once | INTRAMUSCULAR | Status: AC
  Administered 2019-09-04: 19:00:00 2 mg via INTRAVENOUS

## 2019-09-04 MED ORDER — DEXTROSE-NACL 5-0.45 % IV SOLN: INTRAVENOUS | Status: DC

## 2019-09-04 MED ORDER — NITROGLYCERIN 1 MG/10 ML FOR IR/CATH LAB
INTRA_ARTERIAL | Status: AC
  Filled 2019-09-04: qty 10

## 2019-09-04 MED ORDER — SODIUM CHLORIDE 0.9% FLUSH
3.0000 mL | Freq: Two times a day (BID) | INTRAVENOUS | Status: DC
  Administered 2019-09-05 – 2019-09-09 (×11): 3 mL via INTRAVENOUS

## 2019-09-04 MED ORDER — LABETALOL HCL 5 MG/ML IV SOLN: 10.0000 mg | INTRAVENOUS | Status: AC | PRN

## 2019-09-04 MED ORDER — FOLIC ACID 1 MG PO TABS: 1.0000 mg | ORAL_TABLET | Freq: Every day | ORAL | Status: DC

## 2019-09-04 MED ORDER — ONDANSETRON HCL 4 MG/2ML IJ SOLN: 4.0000 mg | Freq: Four times a day (QID) | INTRAMUSCULAR | Status: DC | PRN

## 2019-09-04 MED ORDER — SODIUM BICARBONATE 8.4 % IV SOLN
150.0000 meq | Freq: Once | INTRAVENOUS | Status: AC
  Administered 2019-09-04: 19:00:00 150 meq via INTRAVENOUS

## 2019-09-04 SURGICAL SUPPLY — 13 items
CATH INFINITI 5FR ANG PIGTAIL (CATHETERS) ×2 IMPLANT
CATH INFINITI 5FR JL4 (CATHETERS) ×2 IMPLANT
CATH INFINITI JR4 5F (CATHETERS) ×2 IMPLANT
DEVICE CLOSURE MYNXGRIP 6/7F (Vascular Products) ×2 IMPLANT
DEVICE INFLAT 30 PLUS (MISCELLANEOUS) IMPLANT
DEVICE SAFEGUARD 24CM (GAUZE/BANDAGES/DRESSINGS) ×2 IMPLANT
KIT MANI 3VAL PERCEP (MISCELLANEOUS) ×3 IMPLANT
NDL PERC 18GX7CM (NEEDLE) IMPLANT
NEEDLE PERC 18GX7CM (NEEDLE) ×3 IMPLANT
PACK CARDIAC CATH (CUSTOM PROCEDURE TRAY) ×3 IMPLANT
SHEATH AVANTI 6FR X 11CM (SHEATH) ×2 IMPLANT
WIRE G HI TQ BMW 190 (WIRE) ×2 IMPLANT
WIRE GUIDERIGHT .035X150 (WIRE) ×2 IMPLANT

## 2019-09-04 NOTE — Procedures (Signed)
Central Venous Catheter Placement:TRIPLE LUMEN Indication: Patient receiving vesicant or irritant drug.; Patient receiving intravenous therapy for longer than 5 days.; Patient has limited or no vascular access.   Consent:emergent    Hand washing performed prior to starting the procedure.   Procedure:   An active timeout was performed and correct patient, name, & ID confirmed.   Patient was positioned correctly for central venous access.  Patient was prepped using strict sterile technique including chlorohexadine preps, sterile drape, sterile gown and sterile gloves.    The area was prepped, draped and anesthetized in the usual sterile manner. Patient comfort was obtained.    A triple lumen catheter was placed in RT  Internal Jugular Vein There was good blood return, catheter caps were placed on lumens, catheter flushed easily, the line was secured and a sterile dressing and BIO-PATCH applied.   Ultrasound was used to visualize vasculature and guidance of needle.   Number of Attempts: 1 Complications:none Estimated Blood Loss: none Chest Radiograph indicated and ordered.  Operator: Hattye Siegfried.   Spencer [name] Spencer [name], M.D.  [facility]  Medical Director ICU-[facility] [place] Medical Director [facility] Cardio-Pulmonary Department     

## 2019-09-04 NOTE — H&P (Addendum)
[facility] NAME: Spencer [name]    MR#:  [id number]  DATE OF BIRTH:  10/22/1961  DATE OF ADMISSION:  09/04/2019  PRIMARY CARE PHYSICIAN: Jodi Marble, MD   REQUESTING/REFERRING PHYSICIAN: Dr Carrie Mew  CHIEF COMPLAINT:   Chief Complaint  Patient presents with  . Altered Mental Status    HISTORY OF PRESENT ILLNESS:  Spencer [name]  is a 58 y.o. male with a known history of diabetes presents with altered mental status.  The patient was taken emergently to the Cath Lab because of abnormal EKG.  His cardiac cath was negative.  Hospitalist services were contacted to see the patient in the Cath Lab.  The patient was unable to give any history and was flailing around.  History obtained from old chart.  I did speak with the patient's sister on the phone.  The patient drinks beer and uses crack cocaine.  She states that the patient is not taking his medication.  She is very concerned about the patient's wellbeing.  The patient's chemistry just came back and the patient is in diabetic ketoacidosis and his potassium is greater than 7.5.  Hospitalist services contacted for further admission.  PAST MEDICAL HISTORY:   Past Medical History:  Diagnosis Date  . Brain aneurysm   . Broken bones    clavical, ankle, arms toes wriast   . Diabetes mellitus without complication ([place])   . Dysrhythmia   . GERD (gastroesophageal reflux disease)   . Hypertension   . Substance abuse ([place])     PAST SURGICAL HISTORY:   Past Surgical History:  Procedure Laterality Date  . ESOPHAGOGASTRODUODENOSCOPY (EGD) WITH PROPOFOL N/A 06/04/2019   Procedure: ESOPHAGOGASTRODUODENOSCOPY (EGD) WITH PROPOFOL;  Surgeon: Toledo, Benay Pike, MD;  Location: [facility] ENDOSCOPY;  Service: Gastroenterology;  Laterality: N/A;  . HERNIA REPAIR    . IMPLANTATION / PLACEMENT OF STRIP ELECTRODES VIA BURR HOLES SUBDURAL     anyrusum  . NECK SURGERY    . TEE WITHOUT  CARDIOVERSION N/A 06/03/2019   Procedure: TRANSESOPHAGEAL ECHOCARDIOGRAM (TEE);  Surgeon: Teodoro Spray, MD;  Location: [facility] ORS;  Service: Cardiovascular;  Laterality: N/A;    SOCIAL HISTORY:   Social History   Tobacco Use  . Smoking status: Current Some Day Smoker  . Smokeless tobacco: Never Used  Substance Use Topics  . Alcohol use: Yes    Alcohol/week: 1.0 standard drinks    Types: 1 Cans of beer per week    FAMILY HISTORY:   Family History  Problem Relation Age of Onset  . CAD Sister   . Hypertension Sister   . Healthy Mother   . Healthy Father     DRUG ALLERGIES:  No Known Allergies  REVIEW OF SYSTEMS:  Patient unable to provide review of systems at this time secondary to altered mental status.  MEDICATIONS AT HOME:   Prior to Admission medications   Medication Sig Start Date End Date Taking? Authorizing Provider  amLODipine (NORVASC) 10 MG tablet Take 1 tablet (10 mg total) by mouth daily. 08/22/19   Vaughan Basta, MD  aspirin EC 81 MG tablet Take 1 tablet (81 mg total) by mouth daily. 08/21/19 03/08/20  Vaughan Basta, MD  blood glucose meter kit and supplies Check blood sugar 4 times daily 01/08/19   Mayo, Pete Pelt, MD  insulin glargine (LANTUS) 100 UNIT/ML injection Inject 0.22 mLs (22 Units total) into the skin daily. 08/21/19   Vaughan Basta, MD  Insulin Pen  Needle 31G X 5 MM MISC Check blood sugars 4 times daily 01/08/19   Mayo, Pete Pelt, MD  metFORMIN (GLUCOPHAGE) 1000 MG tablet Take 1,000 mg by mouth 2 (two) times daily with a meal.    [provider]  Multiple Vitamin (MULTIVITAMIN WITH MINERALS) TABS tablet Take 1 tablet by mouth daily. 07/30/19   Fritzi Mandes, MD  NOVOLOG FLEXPEN 100 UNIT/ML FlexPen Inject 0-30 Units into the skin 3 (three) times daily. 06/16/19   [provider]  pantoprazole (PROTONIX) 40 MG tablet Take 1 tablet (40 mg total) by mouth daily. 06/05/19 08/19/19  Saundra Shelling, MD      VITAL  SIGNS:  Blood pressure 98/68, pulse (!) 34, resp. rate (!) 28, height 5' 10"  (1.778 m), weight 64.5 kg.  PHYSICAL EXAMINATION:  GENERAL:  58 y.o.-year-old patient lying in the bed with some agitation EYES: Pupils equal, round, reactive to light and accommodation. No scleral icterus.  HEENT: Head atraumatic, normocephalic. NECK:  Supple, no jugular venous distention. No thyroid enlargement, no tenderness.  LUNGS: Normal breath sounds bilaterally, no wheezing, rales,rhonchi or crepitation. No use of accessory muscles of respiration.  CARDIOVASCULAR: S1, S2 normal. No murmurs, rubs, or gallops.  ABDOMEN: Soft, nontender, nondistended. Bowel sounds present. No organomegaly or mass.  EXTREMITIES: No pedal edema, cyanosis, or clubbing.  NEUROLOGIC: Patient moving all extremities on his own PSYCHIATRIC: The patient is agitated and confused SKIN: No rash, lesion, or ulcer.   LABORATORY PANEL:   CBC Recent Labs  Lab 09/04/19 1633  WBC 26.9*  HGB 12.7*  HCT 46.6  PLT 345   ------------------------------------------------------------------------------------------------------------------  Chemistries  Recent Labs  Lab 09/04/19 1633  NA 135  K >7.5*  CL 83*  CO2 9*  GLUCOSE 1,689*  BUN 97*  CREATININE 6.02*  CALCIUM 8.5*  AST 14*  ALT 14  ALKPHOS 136*  BILITOT 2.4*   ------------------------------------------------------------------------------------------------------------------    RADIOLOGY:  Dg Chest Portable 1 View  Result Date: 09/04/2019 CLINICAL DATA:  Confusion, dehydration EXAM: PORTABLE CHEST 1 VIEW COMPARISON:  Radiograph 08/19/2019 FINDINGS: Pacer pad overlies the chest wall. Stable punctate radiodensities project over the right chest as well. Chronic bronchitic changes are similar to prior. No consolidation, features of edema, pneumothorax, or effusion. Pulmonary vascularity is normally distributed. The cardiomediastinal contours are unremarkable. No acute  osseous or soft tissue abnormality. Cervical fusion hardware is incompletely assessed on this exam. Multiple bilateral rib fractures (left fifth through seventh, right ninth), not clearly visible on comparison studies. While several of these have a more healed appearance the left fifth rib fracture has an acute cortical step-off. IMPRESSION: Multiple bilateral rib fractures (left fifth through seventh, right ninth), not clearly visible on comparison studies. While several of these have a more healed appearance the left fifth rib fracture has an acute cortical step-off. Correlate for point tenderness. No pneumothorax or effusion. Chronic basilar bronchitic changes.  Otherwise clear lungs. Electronically Signed   By: Lovena Le M.D.   On: 09/04/2019 17:02    EKG:   Interpreted by me.  Look like septal ST elevation  IMPRESSION AND PLAN:   1.  Severe diabetic ketoacidosis with acute encephalopathy.  IV fluids ordered.  Insulin drip ordered.  IV insulin push ordered.  Fingersticks every hour. 2.  Severe hyperkalemia with a potassium greater than 7.5.  On insulin drip this should correct pretty quickly but I will give IV insulin push, 2 A of bicarb and calcium gluconate. 3.  Acute kidney injury.  IV fluid hydration.  4.  Alcohol abuse and agitation will put on alcohol withdrawal protocol ICU with Precedex and as needed Ativan.  We will also give IV thiamine. 5.  Send off urine drug toxicology.  Has been positive for cocaine in the past.  As per sister does use crack cocaine. 6.  Lactic acidosis.  IV fluid 7.  Elevated troponin likely demand ischemia from DKA.  Cardiac cath negative. 8.  Leukocytosis.  Could be reactive from DKA.  Awaiting urine.  Chest x-ray negative for fracture 9.  Multiple bilateral rib fractures seen on chest x-ray 10.  Acute hypoxic respiratory failure.  Patient was on nonrebreather while in the Cath Lab.   All the laboratory, radiological and cardiac cath reviewed and case  discussed with ED provider, cardiologist and intensivist Management plans discussed with the patient's sister and she is in agreement.  CODE STATUS: full code  TOTAL TIME TAKING CARE OF THIS PATIENT: 50 minutes.    Loletha Grayer M.D on 09/04/2019 at 6:00 PM  Between 7am to 6pm - Pager - [phone]  After 6pm call admission pager [phone]  [employer] [facility] Office  [phone]  CC: Primary care physician; Jodi Marble, MD

## 2019-09-04 NOTE — ED Notes (Signed)
Pt belongings in bag with patient.

## 2019-09-04 NOTE — ED Notes (Signed)
Second bag of fluids hung per Dr. Joni Fears.

## 2019-09-04 NOTE — ED Notes (Signed)
Pt arrival via [facility] from boarding home. EMS originally called due to high blood sugar. Pt unresponsive when found at home, fire department read blood sugar as 'high'. EMS did EKG and found elevation in V1, V2, and V3.   Pt's blood pressure was 97/50 so no nitro was given in route. 18 g R ac in route.

## 2019-09-04 NOTE — ED Notes (Signed)
CBG read high. °

## 2019-09-04 NOTE — ED Notes (Signed)
Staff at bedside: Opal Sidles, RN. Mac, RN.  Chrissy, Therapist, sports. Caitlyn, tech. Caryl Pina, RN.

## 2019-09-04 NOTE — Consult Note (Signed)
Reason for Consult: STEMI presentation abnormal EKG altered mental status DKA Referring Physician: Dr. Joni Fears emergency room Spencer [name] hospitalist  Spencer [name] is an 58 y.o. male.  HPI: Patient is a 58 year old white male history of substance abuse previous brain aneurysm diabetes GERD hypertension presented after being found on the floor of his group home EKG was performed which showed evidence of possible STEMI she was brought in as a code STEMI.  In emergency room the patient had altered mental status EKG was equivocal but he was brought to the cardiac Cath Lab for cardiac cath with intention for PCI and stent patient was unable to give much of a history because of his altered state  Past Medical History:  Diagnosis Date  . Brain aneurysm   . Broken bones    clavical, ankle, arms toes wriast   . Diabetes mellitus without complication ([place])   . Dysrhythmia   . GERD (gastroesophageal reflux disease)   . Hypertension   . Substance abuse [facility])     Past Surgical History:  Procedure Laterality Date  . ESOPHAGOGASTRODUODENOSCOPY (EGD) WITH PROPOFOL N/A 06/04/2019   Procedure: ESOPHAGOGASTRODUODENOSCOPY (EGD) WITH PROPOFOL;  Surgeon: Toledo, Benay Pike, MD;  Location: [facility] ENDOSCOPY;  Service: Gastroenterology;  Laterality: N/A;  . HERNIA REPAIR    . IMPLANTATION / PLACEMENT OF STRIP ELECTRODES VIA BURR HOLES SUBDURAL     anyrusum  . NECK SURGERY    . TEE WITHOUT CARDIOVERSION N/A 06/03/2019   Procedure: TRANSESOPHAGEAL ECHOCARDIOGRAM (TEE);  Surgeon: Teodoro Spray, MD;  Location: [facility] ORS;  Service: Cardiovascular;  Laterality: N/A;    Family History  Problem Relation Age of Onset  . CAD Sister   . Hypertension Sister   . Healthy Mother   . Healthy Father     Social History:  reports that he has been smoking. He has never used smokeless tobacco. He reports current alcohol use of about 1.0 standard drinks of alcohol per week. He reports previous drug use. Drug:  Cocaine.  Allergies: No Known Allergies  Medications: I have reviewed the patient's current medications.  Results for orders placed or performed during the hospital encounter of 09/04/19 (from the past 48 hour(s))  Comprehensive metabolic panel     Status: Abnormal   Collection Time: 09/04/19  4:33 PM  Result Value Ref Range   Sodium 135 135 - 145 mmol/L    Comment: LYTES REPEATED MJU   Potassium >7.5 (HH) 3.5 - 5.1 mmol/L    Comment: CRITICAL RESULT CALLED TO, READ BACK BY AND VERIFIED WITH MARRY ANN MCDANIEL @1745  09/04/19 MJU RESULT CONFIRMED BY MANUAL DILUTION MJU    Chloride 83 (L) 98 - 111 mmol/L   CO2 9 (L) 22 - 32 mmol/L   Glucose, Bld 1,689 (HH) 70 - 99 mg/dL    Comment: CRITICAL RESULT CALLED TO, READ BACK BY AND VERIFIED WITH MARRY ANN MCDANIEL @1745  09/04/19 MJU RESULT CONFIRMED BY MANUAL DILUTION MJU    BUN 97 (H) 6 - 20 mg/dL   Creatinine, Ser 6.02 (H) 0.61 - 1.24 mg/dL   Calcium 8.5 (L) 8.9 - 10.3 mg/dL   Total Protein 7.1 6.5 - 8.1 g/dL   Albumin 4.4 3.5 - 5.0 g/dL   AST 14 (L) 15 - 41 U/L   ALT 14 0 - 44 U/L   Alkaline Phosphatase 136 (H) 38 - 126 U/L   Total Bilirubin 2.4 (H) 0.3 - 1.2 mg/dL   GFR calc non Af Amer 9 (L) >60 mL/min  GFR calc Af Amer 11 (L) >60 mL/min   Anion gap 43 (H) 5 - 15    Comment: Performed at [facility], [address] [place], [place] [address]  Acetaminophen level     Status: Abnormal   Collection Time: 09/04/19  4:33 PM  Result Value Ref Range   Acetaminophen (Tylenol), Serum <10 (L) 10 - 30 ug/mL    Comment: (NOTE) Therapeutic concentrations vary significantly. A range of 10-30 ug/mL  may be an effective concentration for many patients. However, some  are best treated at concentrations outside of this range. Acetaminophen concentrations >150 ug/mL at 4 hours after ingestion  and >50 ug/mL at 12 hours after ingestion are often associated with  toxic reactions. Performed at [facility], [address] [place], [place] [address]   Ethanol     Status: None   Collection Time: 09/04/19  4:33 PM  Result Value Ref Range   Alcohol, Ethyl (B) <10 <10 mg/dL    Comment: (NOTE) Lowest detectable limit for serum alcohol is 10 mg/dL. For medical purposes only. Performed at [facility], [address] [place], [place] [address]   Salicylate level     Status: None   Collection Time: 09/04/19  4:33 PM  Result Value Ref Range   Salicylate Lvl <7.0 2.8 - 30.0 mg/dL    Comment: Performed at [facility], [address] [place], [place] [address]  Troponin I (High Sensitivity)     Status: Abnormal   Collection Time: 09/04/19  4:33 PM  Result Value Ref Range   Troponin I (High Sensitivity) 29 (H) <18 ng/L    Comment: (NOTE) Elevated high sensitivity troponin I (hsTnI) values and significant  changes across serial measurements may suggest ACS but many other  chronic and acute conditions are known to elevate hsTnI results.  Refer to the "Links" section for chest pain algorithms and additional  guidance. Performed at [facility], [address] [place], [place] [address]   Lactic acid, plasma     Status: Abnormal   Collection Time: 09/04/19  4:33 PM  Result Value Ref Range   Lactic Acid, Venous 3.6 (HH) 0.5 - 1.9 mmol/L    Comment: CRITICAL RESULT CALLED TO, READ BACK BY AND VERIFIED WITH MARRY ANN MCDANIEL @1715  09/04/19 MJU Performed at [facility], [address] [place], [place]   CBC with Differential     Status: Abnormal   Collection Time: 09/04/19  4:33 PM  Result Value Ref Range   WBC 26.9 (H) 4.0 - 10.5 K/uL   RBC 3.97 (L) 4.22 - 5.81 MIL/uL   Hemoglobin 12.7 (L) 13.0 - 17.0 g/dL   HCT 09/06/19 69.6 - 29.5 %   MCV 117.4 (H) 80.0 - 100.0 fL   MCH 32.0 26.0 - 34.0 pg   MCHC 27.3 (L) 30.0 - 36.0 g/dL   RDW 28.4 13.2 - 44.0 %   Platelets 345 150 - 400 K/uL   nRBC 0.0 0.0 - 0.2 %   Neutrophils Relative % 79 %   Neutro Abs  21.3 (H) 1.7 - 7.7 K/uL   Lymphocytes Relative 9 %   Lymphs Abs 2.4 0.7 - 4.0 K/uL   Monocytes Relative 7 %   Monocytes Absolute 2.0 (H) 0.1 - 1.0 K/uL   Eosinophils Relative 0 %   Eosinophils Absolute 0.0 0.0 - 0.5 K/uL   Basophils Relative 0 %   Basophils Absolute 0.1 0.0 - 0.1 K/uL  Immature Granulocytes 5 %   Abs Immature Granulocytes 1.38 (H) 0.00 - 0.07 K/uL    Comment: Performed at [facility], [address] [place], [place] [address]  Protime-INR     Status: None   Collection Time: 09/04/19  4:33 PM  Result Value Ref Range   Prothrombin Time 13.3 11.4 - 15.2 seconds   INR 1.0 0.8 - 1.2    Comment: (NOTE) INR goal varies based on device and disease states. Performed at [facility], [address] [place], [place] [address]   SARS Coronavirus 2 by RT PCR (hospital order, performed in [facility] hospital lab) Nasopharyngeal Nasopharyngeal Swab     Status: None   Collection Time: 09/04/19  4:33 PM   Specimen: Nasopharyngeal Swab  Result Value Ref Range   SARS Coronavirus 2 NEGATIVE NEGATIVE    Comment: (NOTE) If result is NEGATIVE SARS-CoV-2 target nucleic acids are NOT DETECTED. The SARS-CoV-2 RNA is generally detectable in upper and lower  respiratory specimens during the acute phase of infection. The lowest  concentration of SARS-CoV-2 viral copies this assay can detect is 250  copies / mL. A negative result does not preclude SARS-CoV-2 infection  and should not be used as the sole basis for treatment or other  patient management decisions.  A negative result may occur with  improper specimen collection / handling, submission of specimen other  than nasopharyngeal swab, presence of viral mutation(s) within the  areas targeted by this assay, and inadequate number of viral copies  (<250 copies / mL). A negative result must be combined with clinical  observations, patient history, and epidemiological information. If result is  POSITIVE SARS-CoV-2 target nucleic acids are DETECTED. The SARS-CoV-2 RNA is generally detectable in upper and lower  respiratory specimens dur ing the acute phase of infection.  Positive  results are indicative of active infection with SARS-CoV-2.  Clinical  correlation with patient history and other diagnostic information is  necessary to determine patient infection status.  Positive results do  not rule out bacterial infection or co-infection with other viruses. If result is PRESUMPTIVE POSTIVE SARS-CoV-2 nucleic acids MAY BE PRESENT.   A presumptive positive result was obtained on the submitted specimen  and confirmed on repeat testing.  While 2019 novel coronavirus  (SARS-CoV-2) nucleic acids may be present in the submitted sample  additional confirmatory testing may be necessary for epidemiological  and / or clinical management purposes  to differentiate between  SARS-CoV-2 and other Sarbecovirus currently known to infect humans.  If clinically indicated additional testing with an alternate test  methodology [phone]) is advised. The SARS-CoV-2 RNA is generally  detectable in upper and lower respiratory sp ecimens during the acute  phase of infection. The expected result is Negative. Fact Sheet for Patients:  BoilerBrush.com.cy Fact Sheet for Healthcare Providers: https://pope.com/ This test is not yet approved or cleared by the [place] FDA and has been authorized for detection and/or diagnosis of SARS-CoV-2 by FDA under an Emergency Use Authorization (EUA).  This EUA will remain in effect (meaning this test can be used) for the duration of the COVID-19 declaration under Section 564(b)(1) of the Act, 21 U.S.C. section 360bbb-3(b)(1), unless the authorization is terminated or revoked sooner. Performed at [facility], [address] [place], [place] [address]   Blood gas, venous     Status: Abnormal   Collection  Time: 09/04/19  4:34 PM  Result Value Ref Range   pH, Ven 6.93 (LL) 7.250 -  7.430    Comment: CRITICAL RESULT CALLED TO, READ BACK BY AND VERIFIED WITH: STAFFORD, MD AT 1659 ON 09/04/19 [facility], RRT    pCO2, Ven <19.0 (LL) 44.0 - 60.0 mmHg    Comment: CRITICAL RESULT CALLED TO, READ BACK BY AND VERIFIED WITH: STAFFORD, MD AT 1659 ON 09/04/19 [facility], RRT    pO2, Ven 53.0 (H) 32.0 - 45.0 mmHg   Bicarbonate 3.8 (L) 20.0 - 28.0 mmol/L   Acid-base deficit 27.3 (H) 0.0 - 2.0 mmol/L   O2 Saturation 59.3 %   Patient temperature 37.0    Collection site VEIN    Sample type VENOUS     Comment: Performed at [facility], [address] [place], [place] [address]  Glucose, capillary     Status: Abnormal   Collection Time: 09/04/19  4:42 PM  Result Value Ref Range   Glucose-Capillary >600 (HH) 70 - 99 mg/dL  Glucose, capillary     Status: Abnormal   Collection Time: 09/04/19  5:34 PM  Result Value Ref Range   Glucose-Capillary >600 (HH) 70 - 99 mg/dL    Dg Chest Portable 1 View  Result Date: 09/04/2019 CLINICAL DATA:  Confusion, dehydration EXAM: PORTABLE CHEST 1 VIEW COMPARISON:  Radiograph 08/19/2019 FINDINGS: Pacer pad overlies the chest wall. Stable punctate radiodensities project over the right chest as well. Chronic bronchitic changes are similar to prior. No consolidation, features of edema, pneumothorax, or effusion. Pulmonary vascularity is normally distributed. The cardiomediastinal contours are unremarkable. No acute osseous or soft tissue abnormality. Cervical fusion hardware is incompletely assessed on this exam. Multiple bilateral rib fractures (left fifth through seventh, right ninth), not clearly visible on comparison studies. While several of these have a more healed appearance the left fifth rib fracture has an acute cortical step-off. IMPRESSION: Multiple bilateral rib fractures (left fifth through seventh, right ninth), not clearly visible on comparison studies. While  several of these have a more healed appearance the left fifth rib fracture has an acute cortical step-off. Correlate for point tenderness. No pneumothorax or effusion. Chronic basilar bronchitic changes.  Otherwise clear lungs. Electronically Signed   By: Kreg Shropshire M.D.   On: 09/04/2019 17:02    Review of Systems  Unable to perform ROS: Acuity of condition   Blood pressure 98/68, pulse (!) 34, resp. rate (!) 28, height  (1.778 m), weight 64.5 kg. Physical Exam  Nursing note and vitals reviewed. Constitutional: He is oriented to person, place, and time. He appears well-developed and well-nourished.  HENT:  Head: Normocephalic and atraumatic.  Eyes: Pupils are equal, round, and reactive to light. Conjunctivae and EOM are normal.  Neck: Normal range of motion. Neck supple.  Cardiovascular: Normal rate, regular rhythm and normal heart sounds.  Respiratory: Effort normal and breath sounds normal.  GI: Soft. Bowel sounds are normal.  Musculoskeletal: Normal range of motion.  Neurological: He is alert and oriented to person, place, and time. He has normal reflexes.  Skin: Skin is warm and dry.  Psychiatric: He has a normal mood and affect.    Assessment/Plan: STEMI presentation with equivocal EKG DKA blood sugar of 1600 Altered mental status Acidosis Hyperkalemia Acute renal failure GERD Hypertension Substance abuse history History of brain aneurysm . Plan Agree with acute cardiac cath with potential for PCI and stent STEMI presentation Admit to ICU for DKA Hydrate with normal saline Treat hyperkalemia with calcium gluconate bicarb insulin Recommend sedation for altered mental status Consult nephrology input for acute renal failure Recommend conservative  cardiac input at this point  Alwyn PeaDwayne D  09/04/2019, 5:48 PM

## 2019-09-04 NOTE — Progress Notes (Signed)
Pharmacy Antibiotic Note  Spencer [name] is a 58 y.o. male admitted on 09/04/2019 with aspiration pneumonia?  Pharmacy has been consulted for Unasyn dosing. DKA, encephalopathy Hx of ETOH/crack cocaine  Plan: Unasyn 3 gm IV q24h  for Crcl 11.4 ml/min    Height: 5\' 10"  (177.8 cm) Weight: 136 lb 0.4 oz (61.7 kg) IBW/kg (Calculated) : 73  No data recorded.  Recent Labs  Lab 09/04/19 1633  WBC 26.9*  CREATININE 6.02*  LATICACIDVEN 3.6*    Estimated Creatinine Clearance: 11.4 mL/min (A) (by C-G formula based on SCr of 6.02 mg/dL (H)).    No Known Allergies  Antimicrobials this admission: Unasyn 10/29 >>       >>    Dose adjustments this admission:    Microbiology results:   BCx:     UCx:      Sputum:      MRSA PCR:    Thank you for allowing pharmacy to be a part of this patient's care.  Masako Overall A 09/04/2019 7:55 PM

## 2019-09-04 NOTE — ED Provider Notes (Signed)
[facility] Emergency Department Provider Note  ____________________________________________  Time seen: Approximately 4:54 PM  I have reviewed the triage vital signs and the nursing notes.   HISTORY  Chief Complaint Altered Mental Status    Level 5 Caveat: Portions of the History and Physical including HPI and review of systems are unable to be completely obtained due to patient being stuporous  HPI Spencer [name] is a 58 y.o. male with a history of diabetes GERD hypertension substance abuse and medication noncompliance who is brought to the ED  by EMS.  They report they were called to a boarding home by another resident due to the patient being unresponsive and confused.  They found blood sugar greater than 600, patient altered.  They gave 500 mL of IV fluids.  Patient was unable to provide any history to them but EKG did show anterior ST elevation.  They report they were unable to notify [facility] due to a phone system issue.     Past Medical History:  Diagnosis Date  . Brain aneurysm   . Broken bones    clavical, ankle, arms toes wriast   . Diabetes mellitus without complication ([place])   . Dysrhythmia   . GERD (gastroesophageal reflux disease)   . Hypertension   . Substance abuse [facility])      Patient Active Problem List   Diagnosis Date Noted  . Atrial fibrillation with RVR ([place]) 08/07/2019  . GERD (gastroesophageal reflux disease) 08/07/2019  . Acute renal failure ([place])   . Hyperkalemia   . Sepsis ([place]) 06/09/2019  . AKI (acute kidney injury) ([place]) 02/19/2019  . Hypoglycemia 10/23/2018  . Chronic low back pain 01/16/2017  . Chronic neck pain 01/16/2017  . Diabetic neuropathy ([place]) 01/16/2017  . DM2 (diabetes mellitus, type 2) ([place]) 01/16/2017  . History of cocaine abuse ([place]) 01/16/2017  . Personal history of subdural hematoma 01/16/2017  . Closed displaced fracture of body of left calcaneus with delayed healing 12/13/2016  . Protein-calorie  malnutrition, severe 11/08/2016  . DKA, type 2 ([place]) 11/06/2016  . DKA (diabetic ketoacidoses) ([place]) 04/03/2015  . Hypertension 04/03/2015  . Depression 04/03/2015  . Opiate dependence ([place]) 04/03/2015  . Tobacco abuse 04/03/2015  . Lumbosacral neuritis 07/19/2014  . Pain of finger of right hand 07/19/2014  . Type II or unspecified type diabetes mellitus without mention of complication, not stated as uncontrolled 07/19/2014  . Knee pain 09/12/2013  . Hernia of flank 09/20/2012  . Epidermoid cyst of skin 09/20/2012  . Chronic pain of both shoulders 03/27/2012     Past Surgical History:  Procedure Laterality Date  . ESOPHAGOGASTRODUODENOSCOPY (EGD) WITH PROPOFOL N/A 06/04/2019   Procedure: ESOPHAGOGASTRODUODENOSCOPY (EGD) WITH PROPOFOL;  Surgeon: Toledo, Benay Pike, MD;  Location: [facility] ENDOSCOPY;  Service: Gastroenterology;  Laterality: N/A;  . HERNIA REPAIR    . IMPLANTATION / PLACEMENT OF STRIP ELECTRODES VIA BURR HOLES SUBDURAL     anyrusum  . NECK SURGERY    . TEE WITHOUT CARDIOVERSION N/A 06/03/2019   Procedure: TRANSESOPHAGEAL ECHOCARDIOGRAM (TEE);  Surgeon: Teodoro Spray, MD;  Location: [facility] ORS;  Service: Cardiovascular;  Laterality: N/A;     Prior to Admission medications   Medication Sig Start Date End Date Taking? Authorizing Provider  amLODipine (NORVASC) 10 MG tablet Take 1 tablet (10 mg total) by mouth daily. 08/22/19   Vaughan Basta, MD  aspirin EC 81 MG tablet Take 1 tablet (81 mg total) by mouth daily. 08/21/19 03/08/20  Vaughan Basta, MD  blood  glucose meter kit and supplies Check blood sugar 4 times daily 01/08/19   Mayo, Pete Pelt, MD  insulin glargine (LANTUS) 100 UNIT/ML injection Inject 0.22 mLs (22 Units total) into the skin daily. 08/21/19   Vaughan Basta, MD  Insulin Pen Needle 31G X 5 MM MISC Check blood sugars 4 times daily 01/08/19   Mayo, Pete Pelt, MD  metFORMIN (GLUCOPHAGE) 1000 MG tablet Take 1,000 mg by mouth 2 (two) times daily  with a meal.    [provider]  Multiple Vitamin (MULTIVITAMIN WITH MINERALS) TABS tablet Take 1 tablet by mouth daily. 07/30/19   Fritzi Mandes, MD  NOVOLOG FLEXPEN 100 UNIT/ML FlexPen Inject 0-30 Units into the skin 3 (three) times daily. 06/16/19   [provider]  pantoprazole (PROTONIX) 40 MG tablet Take 1 tablet (40 mg total) by mouth daily. 06/05/19 08/19/19  Saundra Shelling, MD     Allergies Patient has no known allergies.   Family History  Problem Relation Age of Onset  . CAD Sister   . Hypertension Sister   . Healthy Mother   . Healthy Father     Social History Social History   Tobacco Use  . Smoking status: Current Some Day Smoker  . Smokeless tobacco: Never Used  Substance Use Topics  . Alcohol use: Yes    Alcohol/week: 1.0 standard drinks    Types: 1 Cans of beer per week  . Drug use: Not Currently    Types: Cocaine    Review of Systems Level 5 Caveat: Portions of the History and Physical including HPI and review of systems are unable to be completely obtained due to patient being a poor historian   Constitutional:   No known fever.  ENT:   No rhinorrhea. Cardiovascular:   No chest pain or syncope. Respiratory:   No dyspnea or cough. Gastrointestinal:   Negative for abdominal pain, vomiting and diarrhea.  Musculoskeletal:   Negative for focal pain or swelling ____________________________________________   PHYSICAL EXAM:  VITAL SIGNS: ED Triage Vitals  Enc Vitals Group     BP 09/04/19 1629 98/68     Pulse Rate 09/04/19 1629 (!) 34     Resp 09/04/19 1629 (!) 28     Temp --      Temp src --      SpO2 --      Weight 09/04/19 1640 142 lb 3.2 oz (64.5 kg)     Height 09/04/19 1640 _0  (1.778 m)     Head Circumference --      Peak Flow --      Pain Score --      Pain Loc --      Pain Edu? --      Excl. in [place]? --     Vital signs reviewed, nursing assessments reviewed.   Constitutional: Not alert, not oriented.  Ill-appearing,  groaning Eyes:   Conjunctivae are normal. EOMI untestable. PERRL at 2 mm. ENT      Head:   Normocephalic and atraumatic.      Nose:   No congestion/rhinnorhea.  No epistaxis      Mouth/Throat:   Dry mucous membranes, no pharyngeal erythema. No peritonsillar mass.       Neck:   No meningismus. Full ROM. Hematological/Lymphatic/Immunilogical:   No cervical lymphadenopathy. Cardiovascular:   Tachycardia heart rate 100. Symmetric bilateral radial and DP pulses.  No murmurs. Cap refill 4 seconds. Respiratory:   Tachypnea / Kussmaul respirations.  breath sounds are clear and equal  bilaterally. No wheezes/rales/rhonchi. Gastrointestinal:   Soft and nontender. Non distended. There is no CVA tenderness.  No rebound, rigidity, or guarding.  Musculoskeletal:   Normal range of motion in all extremities. No joint effusions.  No lower extremity tenderness.  No edema. Neurologic:   Groaning Motor grossly intact. GCS = E1V2M5 = 8 Skin:    Skin is warm, dry and intact. No rash noted.  No petechiae, purpura, or bullae.  ____________________________________________    LABS (pertinent positives/negatives) (all labs ordered are listed, but only abnormal results are displayed) Labs Reviewed  CBC WITH DIFFERENTIAL/PLATELET - Abnormal; Notable for the following components:      Result Value   WBC 26.9 (*)    RBC 3.97 (*)    Hemoglobin 12.7 (*)    MCV 117.4 (*)    MCHC 27.3 (*)    All other components within normal limits  BLOOD GAS, VENOUS - Abnormal; Notable for the following components:   pH, Ven 6.93 (*)    pCO2, Ven <19.0 (*)    pO2, Ven 53.0 (*)    Bicarbonate 3.8 (*)    Acid-base deficit 27.3 (*)    All other components within normal limits  GLUCOSE, CAPILLARY - Abnormal; Notable for the following components:   Glucose-Capillary >600 (*)    All other components within normal limits  SARS CORONAVIRUS 2 BY RT PCR (HOSPITAL ORDER, [place] [facility])  PROTIME-INR   COMPREHENSIVE METABOLIC PANEL  ACETAMINOPHEN LEVEL  ETHANOL  SALICYLATE LEVEL  LACTIC ACID, PLASMA  LACTIC ACID, PLASMA  URINALYSIS, COMPLETE (UACMP) WITH MICROSCOPIC  URINE DRUG SCREEN, QUALITATIVE ([facility] ONLY)  TROPONIN I (HIGH SENSITIVITY)   ____________________________________________   EKG  Interpreted by me Sinus rhythm rate of 98, normal axis, slightly prolonged QTC of 5 1 0 ms.  Peaked T waves anteriorly.  Pronounced ST elevation in V1 and V2.  ____________________________________________    RADIOLOGY  No results found.  ____________________________________________   PROCEDURES .Critical Care Performed by: Carrie Mew, MD Authorized by: Carrie Mew, MD   Critical care provider statement:    Critical care time (minutes):  32   Critical care time was exclusive of:  Separately billable procedures and treating other patients   Critical care was necessary to treat or prevent imminent or life-threatening deterioration of the following conditions:  Shock, CNS failure or compromise, cardiac failure, circulatory failure, metabolic crisis and dehydration   Critical care was time spent personally by me on the following activities:  Development of treatment plan with patient or surrogate, discussions with consultants, evaluation of patient's response to treatment, examination of patient, obtaining history from patient or surrogate, ordering and performing treatments and interventions, ordering and review of laboratory studies, ordering and review of radiographic studies, pulse oximetry, re-evaluation of patient's condition and review of old charts    ____________________________________________  DIFFERENTIAL DIAGNOSIS   DKA, intoxication, STEMI, dehydration, acute renal failure, stroke  CLINICAL IMPRESSION / ASSESSMENT AND PLAN / ED COURSE  Medications ordered in the ED: Medications  sodium chloride 0.9 % bolus 2,000 mL (2,000 mLs Intravenous New Bag/Given  09/04/19 1637)  aspirin suppository 300 mg (300 mg Rectal Given 09/04/19 1648)  midazolam (VERSED) 2 MG/2ML injection (has no administration in time range)  fentaNYL (SUBLIMAZE) 100 MCG/2ML injection (has no administration in time range)  clopidogrel (PLAVIX) 75 MG tablet (has no administration in time range)  nitroGLYCERIN 100 mcg/mL intra-arterial injection (has no administration in time range)  bivalirudin (ANGIOMAX) 250 MG injection (has no  administration in time range)  Heparin (Porcine) in NaCl 1000-0.9 UT/500ML-% SOLN (has no administration in time range)    Pertinent labs & imaging results that were available during my care of the patient were reviewed by me and considered in my medical decision making (see chart for details).   Spencer [name] was evaluated in Emergency Department on 09/04/2019 for the symptoms described in the history of present illness. He was evaluated in the context of the global COVID-19 pandemic, which necessitated consideration that the patient might be at risk for infection with the SARS-CoV-2 virus that causes COVID-19. Institutional protocols and algorithms that pertain to the evaluation of patients at risk for COVID-19 are in a state of rapid change based on information released by regulatory bodies including the CDC and federal and state organizations. These policies and algorithms were followed during the patient's care in the ED.   Patient presents with altered mental status, likely DKA.  However with EKG concerning for STEMI, cardiology arrived to bedside right away on arrival to the ED.  Dr. Clayborn Bigness advises that they will need to do a emergent diagnostic cath to rule out coronary occlusion which unfortunately cannot wait for the time it would take to rule out other serious illnesses.  Patient is critically ill.  Giving 2 L IV fluids.  No evidence of any acute bacterial infection.  Clinical Course as of Sep 03 1702  Thu Sep 04, 2019  1637 HR 100 on exam.  Tachypneic c/w kussmaul resp. Likely DKA. [place] at bedside on arrival to assess abnormal EKG. Pt unable to provide any history.   [PS]  6606 Chest x-ray unremarkable.  Case discussed with Dr. Clayborn Bigness, likely DKA but due to concerning ST elevation anteriorly, will need to proceed with emergent cardiac cath to rule out STEMI.   [PS]  1651 Pt is maintaining airway, not vomiting   [PS]    Clinical Course User Index [PS] Carrie Mew, MD     ----------------------------------------- 4:58 PM on 09/04/2019 -----------------------------------------  Patient left the ED with cardiology to Cath Lab.  Case discussed with hospitalist Dr. Leslye Peer as well.  ____________________________________________   FINAL CLINICAL IMPRESSION(S) / ED DIAGNOSES    Final diagnoses:  Diabetic ketoacidosis with coma associated with type 2 diabetes mellitus ([place])  ST elevation myocardial infarction involving left anterior descending (LAD) coronary artery [facility])     ED Discharge Orders    None      Portions of this note were generated with dragon dictation software. Dictation errors may occur despite best attempts at proofreading.   Carrie Mew, MD 09/04/19 [phone]

## 2019-09-04 NOTE — ED Notes (Signed)
Pads on pt, ready for transport.

## 2019-09-04 NOTE — ED Notes (Addendum)
STEMI called

## 2019-09-04 NOTE — ED Notes (Addendum)
NS bolus started per Dr. Joni Fears.

## 2019-09-04 NOTE — ED Notes (Signed)
X-ray at bedside

## 2019-09-04 NOTE — ED Notes (Signed)
18 g R ac in.

## 2019-09-04 NOTE — ED Notes (Signed)
Pt prepared for cath, cath not ready at this time.

## 2019-09-04 NOTE — ED Notes (Signed)
Pt covid swabbed

## 2019-09-04 NOTE — Consult Note (Signed)
Name: NOSSON WENDER MRN: [id number] DOB: [birth year]     CONSULTATION DATE: 09/04/2019 CHIEF COMPLAINT: mental status changed    HISTORY OF PRESENT ILLNESS:   58 year old white male history of substance abuse previous brain aneurysm diabetes GERD hypertension presented after being found on the floor of his group home  -EKG was performed which showed evidence of possible STEMI she was brought in as a code STEMI.  - In emergency room the patient had altered mental status EKG was equivocal but he was brought to the cardiac Cath Lab for cardiac cath with intention for PCI and stent patient was unable to give much of a history because of his altered state  Admitted to ICU for increased WOB, severe acidosis and severe resp distress with severe DKA  PAST MEDICAL HISTORY :   has a past medical history of Brain aneurysm, Broken bones, Diabetes mellitus without complication ([place]), Dysrhythmia, GERD (gastroesophageal reflux disease), Hypertension, and Substance abuse ([place]).  has a past surgical history that includes Neck surgery; Hernia repair; TEE without cardioversion (N/A, 06/03/2019); Implantation / placement of strip electrodes via burr holes subdural; and Esophagogastroduodenoscopy (egd) with propofol (N/A, 06/04/2019). Prior to Admission medications   Medication Sig Start Date End Date Taking? Authorizing Provider  amLODipine (NORVASC) 10 MG tablet Take 1 tablet (10 mg total) by mouth daily. 08/22/19   Vaughan Basta, MD  aspirin EC 81 MG tablet Take 1 tablet (81 mg total) by mouth daily. 08/21/19 03/08/20  Vaughan Basta, MD  blood glucose meter kit and supplies Check blood sugar 4 times daily 01/08/19   Mayo, Pete Pelt, MD  insulin glargine (LANTUS) 100 UNIT/ML injection Inject 0.22 mLs (22 Units total) into the skin daily. 08/21/19   Vaughan Basta, MD  Insulin Pen Needle 31G X 5 MM MISC Check blood sugars 4 times daily 01/08/19   Mayo, Pete Pelt, MD  metFORMIN (GLUCOPHAGE)  1000 MG tablet Take 1,000 mg by mouth 2 (two) times daily with a meal.    [provider]  Multiple Vitamin (MULTIVITAMIN WITH MINERALS) TABS tablet Take 1 tablet by mouth daily. 07/30/19   Fritzi Mandes, MD  NOVOLOG FLEXPEN 100 UNIT/ML FlexPen Inject 0-30 Units into the skin 3 (three) times daily. 06/16/19   [provider]  pantoprazole (PROTONIX) 40 MG tablet Take 1 tablet (40 mg total) by mouth daily. 06/05/19 08/19/19  Saundra Shelling, MD   No Known Allergies  FAMILY HISTORY:  family history includes CAD in his sister; Healthy in his father and mother; Hypertension in his sister. SOCIAL HISTORY:  reports that he has been smoking. He has never used smokeless tobacco. He reports current alcohol use of about 1.0 standard drinks of alcohol per week. He reports current drug use. Drug: Cocaine.  REVIEW OF SYSTEMS:   Unable to obtain due to critical illness   Review of Systems:  Gen:  Denies  fever, sweats, chills weight loss  HEENT: Denies blurred vision, double vision, ear pain, eye pain, hearing loss, nose bleeds, sore throat Cardiac:  No dizziness, chest pain or heaviness, chest tightness,edema, No JVD Resp:   No cough, -sputum production, -shortness of breath,-wheezing, -hemoptysis,  Gi: Denies swallowing difficulty, stomach pain, nausea or vomiting, diarrhea, constipation, bowel incontinence Gu:  Denies bladder incontinence, burning urine Ext:   Denies Joint pain, stiffness or swelling Skin: Denies  skin rash, easy bruising or bleeding or hives Endoc:  Denies polyuria, polydipsia , polyphagia or weight change Psych:   Denies depression, insomnia or hallucinations  Other:  All other systems negative   VITAL SIGNS: Pulse Rate:  [34] 34 (10/29 1629) Resp:  [28] 28 (10/29 1629) BP: (98)/(68) 98/68 (10/29 1629) Weight:  [64.5 kg] 64.5 kg (10/29 1640)   No intake/output data recorded. No intake/output data recorded.       Physical Examination:   GENERAL:critically ill appearing, +resp distress HEAD: Normocephalic, atraumatic.  EYES: Pupils equal, round, reactive to light.  No scleral icterus.  MOUTH: Moist mucosal membrane. NECK: Supple. No JVD.  PULMONARY: +rhonchi, +wheezing CARDIOVASCULAR: S1 and S2. Regular rate and rhythm. No murmurs, rubs, or gallops.  GASTROINTESTINAL: Soft, nontender, -distended. No masses. Positive bowel sounds. No hepatosplenomegaly.  MUSCULOSKELETAL: No swelling, clubbing, or edema.  NEUROLOGIC: obtunded SKIN:intact,warm,dry   MEDICATIONS: I have reviewed all medications and confirmed regimen as documented   CULTURE RESULTS   Recent Results (from the past 240 hour(s))  SARS Coronavirus 2 by RT PCR (hospital order, performed in [facility] hospital lab) Nasopharyngeal Nasopharyngeal Swab     Status: None   Collection Time: 09/04/19  4:33 PM   Specimen: Nasopharyngeal Swab  Result Value Ref Range Status   SARS Coronavirus 2 NEGATIVE NEGATIVE Final    Comment: (NOTE) If result is NEGATIVE SARS-CoV-2 target nucleic acids are NOT DETECTED. The SARS-CoV-2 RNA is generally detectable in upper and lower  respiratory specimens during the acute phase of infection. The lowest  concentration of SARS-CoV-2 viral copies this assay can detect is 250  copies / mL. A negative result does not preclude SARS-CoV-2 infection  and should not be used as the sole basis for treatment or other  patient management decisions.  A negative result may occur with  improper specimen collection / handling, submission of specimen other  than nasopharyngeal swab, presence of viral mutation(s) within the  areas targeted by this assay, and inadequate number of viral copies  (<250 copies / mL). A negative result must be combined with clinical  observations, patient history, and epidemiological information. If result is POSITIVE SARS-CoV-2 target nucleic acids are DETECTED. The SARS-CoV-2 RNA is generally detectable in upper and  lower  respiratory specimens dur ing the acute phase of infection.  Positive  results are indicative of active infection with SARS-CoV-2.  Clinical  correlation with patient history and other diagnostic information is  necessary to determine patient infection status.  Positive results do  not rule out bacterial infection or co-infection with other viruses. If result is PRESUMPTIVE POSTIVE SARS-CoV-2 nucleic acids MAY BE PRESENT.   A presumptive positive result was obtained on the submitted specimen  and confirmed on repeat testing.  While 2019 novel coronavirus  (SARS-CoV-2) nucleic acids may be present in the submitted sample  additional confirmatory testing may be necessary for epidemiological  and / or clinical management purposes  to differentiate between  SARS-CoV-2 and other Sarbecovirus currently known to infect humans.  If clinically indicated additional testing with an alternate test  methodology [phone]) is advised. The SARS-CoV-2 RNA is generally  detectable in upper and lower respiratory sp ecimens during the acute  phase of infection. The expected result is Negative. Fact Sheet for Patients:  StrictlyIdeas.no Fact Sheet for Healthcare Providers: BankingDealers.co.za This test is not yet approved or cleared by the [place] FDA and has been authorized for detection and/or diagnosis of SARS-CoV-2 by FDA under an Emergency Use Authorization (EUA).  This EUA will remain in effect (meaning this test can be used) for the duration of the COVID-19 declaration under Section 564(b)(1) of the  Act, 21 U.S.C. section 360bbb-3(b)(1), unless the authorization is terminated or revoked sooner. Performed at [facility], [address] [place], [place] [address]           IMAGING    Dg Chest Portable 1 View  Result Date: 09/04/2019 CLINICAL DATA:  Confusion, dehydration EXAM: PORTABLE CHEST 1 VIEW COMPARISON:   Radiograph 08/19/2019 FINDINGS: Pacer pad overlies the chest wall. Stable punctate radiodensities project over the right chest as well. Chronic bronchitic changes are similar to prior. No consolidation, features of edema, pneumothorax, or effusion. Pulmonary vascularity is normally distributed. The cardiomediastinal contours are unremarkable. No acute osseous or soft tissue abnormality. Cervical fusion hardware is incompletely assessed on this exam. Multiple bilateral rib fractures (left fifth through seventh, right ninth), not clearly visible on comparison studies. While several of these have a more healed appearance the left fifth rib fracture has an acute cortical step-off. IMPRESSION: Multiple bilateral rib fractures (left fifth through seventh, right ninth), not clearly visible on comparison studies. While several of these have a more healed appearance the left fifth rib fracture has an acute cortical step-off. Correlate for point tenderness. No pneumothorax or effusion. Chronic basilar bronchitic changes.  Otherwise clear lungs. Electronically Signed   By: Lovena Le M.D.   On: 09/04/2019 17:02    CBC    Component Value Date/Time   WBC 26.9 (H) 09/04/2019 1633   RBC 3.97 (L) 09/04/2019 1633   HGB 12.7 (L) 09/04/2019 1633   HCT 46.6 09/04/2019 1633   PLT 345 09/04/2019 1633   MCV 117.4 (H) 09/04/2019 1633   MCH 32.0 09/04/2019 1633   MCHC 27.3 (L) 09/04/2019 1633   RDW 13.6 09/04/2019 1633   LYMPHSABS 2.4 09/04/2019 1633   MONOABS 2.0 (H) 09/04/2019 1633   EOSABS 0.0 09/04/2019 1633   BASOSABS 0.1 09/04/2019 1633     BMP Latest Ref Rng & Units 09/04/2019 08/20/2019 08/19/2019  Glucose 70 - 99 mg/dL 1,689(HH) 29(LL) 112(H)  BUN 6 - 20 mg/dL 97(H) 21(H) 25(H)  Creatinine 0.61 - 1.24 mg/dL 6.02(H) 0.66 0.86  Sodium 135 - 145 mmol/L 135 139 142  Potassium 3.5 - 5.1 mmol/L >7.5(HH) 3.8 4.0  Chloride 98 - 111 mmol/L 83(L) 107 106  CO2 22 - 32 mmol/L 9(L) 25 23  Calcium 8.9 - 10.3 mg/dL  8.5(L) 8.8(L) 9.6      Indwelling Urinary Catheter continued, requirement due to   Reason to continue Indwelling Urinary Catheter strict Intake/Output monitoring for hemodynamic instability      ASSESSMENT AND PLAN SYNOPSIS   Severe ACUTE Hypoxic and Hypercapnic Respiratory Failure from severe acidosis and encephalopathy from DKA High risk for intubation    ACUTE KIDNEY INJURY/Renal Failure -follow chem 7 -follow UO -continue Foley Catheter-assess need -Avoid nephrotoxic agents   NEUROLOGY Severe encephalopathy  CARDIAC ICU monitoring  ID -continue IV abx as prescibed -follow up cultures  GI GI PROPHYLAXIS as indicated  NUTRITIONAL STATUS DIET-->TF's as tolerated Constipation protocol as indicated   ENDO - will use ICU hypoglycemic\Hyperglycemia protocol if needed    ELECTROLYTES -follow labs as needed -replace as needed -pharmacy consultation and following   DVT/GI PRX ordered TRANSFUSIONS AS NEEDED MONITOR FSBS ASSESS the need for LABS    Critical Care Time devoted to patient care services described in this note is 43 minutes.   Overall, patient is critically ill, prognosis is guarded.  Patient with Multiorgan failure and at high risk for cardiac arrest and death.    Corrin Parker,  M.D.  [facility]  Medical Director [place] Director [facility] Cardio-Pulmonary Department

## 2019-09-04 NOTE — ED Notes (Signed)
Cardiologist at the bedside

## 2019-09-04 NOTE — Progress Notes (Signed)
Patient admitted from the cath lab. Patient was awake but agitated/combative upon arrival. Dr. Mortimer Fries at bedside and per his orders patient started on precedex and was given precedex bolus, 2 mg of ativan, 4 mg of versed, and 4 amps of bicarb. He also ordered a total of 3 L of normal saline boluses. Patient finally became calm after versed given. 3 different RNs unable to successfully start IVs due to patient's agitation and difficult vasculature and only one IV remained after cath lab due to patient's agitation. One 20g was obtained by fourth RN but questionable to flush. Dr. Mortimer Fries placed central line after patient became more calm. X-ray results pending for placement confirmation still after report given to [facility] for oncoming shift. IV Insulin was started on glucostabilizer. Dr. Mortimer Fries was aware of low BPs after versed, patient was still to receive 2.5 L of fluid boluses when BP was low. MD also ordered levophed once central line was placed.

## 2019-09-04 NOTE — ED Notes (Signed)
Otila Kluver, RN at bedside.

## 2019-09-04 NOTE — ED Notes (Signed)
Dr. Stafford at bedside.  

## 2019-09-04 NOTE — ED Triage Notes (Signed)
See blank note 

## 2019-09-04 NOTE — ED Notes (Signed)
Dr. Joni Fears ordered 2 L NS.

## 2019-09-05 ENCOUNTER — Encounter: Payer: Self-pay | Admitting: Internal Medicine

## 2019-09-05 ENCOUNTER — Inpatient Hospital Stay: Payer: Medicaid Other

## 2019-09-05 DIAGNOSIS — Z716 Tobacco abuse counseling: Secondary | ICD-10-CM

## 2019-09-05 DIAGNOSIS — E870 Hyperosmolality and hypernatremia: Secondary | ICD-10-CM | POA: Diagnosis not present

## 2019-09-05 DIAGNOSIS — F05 Delirium due to known physiological condition: Secondary | ICD-10-CM | POA: Diagnosis not present

## 2019-09-05 DIAGNOSIS — E1310 Other specified diabetes mellitus with ketoacidosis without coma: Secondary | ICD-10-CM | POA: Diagnosis not present

## 2019-09-05 DIAGNOSIS — E1111 Type 2 diabetes mellitus with ketoacidosis with coma: Secondary | ICD-10-CM

## 2019-09-05 DIAGNOSIS — I10 Essential (primary) hypertension: Secondary | ICD-10-CM

## 2019-09-05 DIAGNOSIS — G9340 Encephalopathy, unspecified: Secondary | ICD-10-CM | POA: Diagnosis not present

## 2019-09-05 LAB — CBC
HCT: 33.4 % — ABNORMAL LOW (ref 39.0–52.0)
Hemoglobin: 11.5 g/dL — ABNORMAL LOW (ref 13.0–17.0)
MCH: 32.4 pg (ref 26.0–34.0)
MCHC: 34.4 g/dL (ref 30.0–36.0)
MCV: 94.1 fL (ref 80.0–100.0)
Platelets: 216 10*3/uL (ref 150–400)
RBC: 3.55 MIL/uL — ABNORMAL LOW (ref 4.22–5.81)
RDW: 13 % (ref 11.5–15.5)
WBC: 10.8 10*3/uL — ABNORMAL HIGH (ref 4.0–10.5)
nRBC: 0 % (ref 0.0–0.2)

## 2019-09-05 LAB — BASIC METABOLIC PANEL
Anion gap: 10 (ref 5–15)
Anion gap: 11 (ref 5–15)
Anion gap: 15 (ref 5–15)
Anion gap: 20 — ABNORMAL HIGH (ref 5–15)
Anion gap: 25 — ABNORMAL HIGH (ref 5–15)
Anion gap: 27 — ABNORMAL HIGH (ref 5–15)
BUN: 61 mg/dL — ABNORMAL HIGH (ref 6–20)
BUN: 63 mg/dL — ABNORMAL HIGH (ref 6–20)
BUN: 65 mg/dL — ABNORMAL HIGH (ref 6–20)
BUN: 69 mg/dL — ABNORMAL HIGH (ref 6–20)
BUN: 71 mg/dL — ABNORMAL HIGH (ref 6–20)
BUN: 77 mg/dL — ABNORMAL HIGH (ref 6–20)
CO2: 15 mmol/L — ABNORMAL LOW (ref 22–32)
CO2: 20 mmol/L — ABNORMAL LOW (ref 22–32)
CO2: 21 mmol/L — ABNORMAL LOW (ref 22–32)
CO2: 28 mmol/L (ref 22–32)
CO2: 29 mmol/L (ref 22–32)
CO2: 30 mmol/L (ref 22–32)
Calcium: 7.6 mg/dL — ABNORMAL LOW (ref 8.9–10.3)
Calcium: 7.7 mg/dL — ABNORMAL LOW (ref 8.9–10.3)
Calcium: 7.8 mg/dL — ABNORMAL LOW (ref 8.9–10.3)
Calcium: 7.8 mg/dL — ABNORMAL LOW (ref 8.9–10.3)
Calcium: 7.8 mg/dL — ABNORMAL LOW (ref 8.9–10.3)
Calcium: 7.9 mg/dL — ABNORMAL LOW (ref 8.9–10.3)
Chloride: 112 mmol/L — ABNORMAL HIGH (ref 98–111)
Chloride: 114 mmol/L — ABNORMAL HIGH (ref 98–111)
Chloride: 117 mmol/L — ABNORMAL HIGH (ref 98–111)
Chloride: 119 mmol/L — ABNORMAL HIGH (ref 98–111)
Chloride: 120 mmol/L — ABNORMAL HIGH (ref 98–111)
Chloride: 122 mmol/L — ABNORMAL HIGH (ref 98–111)
Creatinine, Ser: 2.38 mg/dL — ABNORMAL HIGH (ref 0.61–1.24)
Creatinine, Ser: 2.54 mg/dL — ABNORMAL HIGH (ref 0.61–1.24)
Creatinine, Ser: 2.88 mg/dL — ABNORMAL HIGH (ref 0.61–1.24)
Creatinine, Ser: 3.63 mg/dL — ABNORMAL HIGH (ref 0.61–1.24)
Creatinine, Ser: 3.8 mg/dL — ABNORMAL HIGH (ref 0.61–1.24)
Creatinine, Ser: 4.37 mg/dL — ABNORMAL HIGH (ref 0.61–1.24)
GFR calc Af Amer: 16 mL/min — ABNORMAL LOW (ref >60)
GFR calc Af Amer: 19 mL/min — ABNORMAL LOW (ref >60)
GFR calc Af Amer: 20 mL/min — ABNORMAL LOW (ref >60)
GFR calc Af Amer: 26 mL/min — ABNORMAL LOW (ref >60)
GFR calc Af Amer: 31 mL/min — ABNORMAL LOW (ref >60)
GFR calc Af Amer: 33 mL/min — ABNORMAL LOW (ref >60)
GFR calc non Af Amer: 14 mL/min — ABNORMAL LOW (ref >60)
GFR calc non Af Amer: 16 mL/min — ABNORMAL LOW (ref >60)
GFR calc non Af Amer: 17 mL/min — ABNORMAL LOW (ref >60)
GFR calc non Af Amer: 23 mL/min — ABNORMAL LOW (ref >60)
GFR calc non Af Amer: 26 mL/min — ABNORMAL LOW (ref >60)
GFR calc non Af Amer: 29 mL/min — ABNORMAL LOW (ref >60)
Glucose, Bld: 115 mg/dL — ABNORMAL HIGH (ref 70–99)
Glucose, Bld: 125 mg/dL — ABNORMAL HIGH (ref 70–99)
Glucose, Bld: 189 mg/dL — ABNORMAL HIGH (ref 70–99)
Glucose, Bld: 442 mg/dL — ABNORMAL HIGH (ref 70–99)
Glucose, Bld: 566 mg/dL (ref 70–99)
Glucose, Bld: 813 mg/dL (ref 70–99)
Potassium: 2.9 mmol/L — ABNORMAL LOW (ref 3.5–5.1)
Potassium: 3.5 mmol/L (ref 3.5–5.1)
Potassium: 3.7 mmol/L (ref 3.5–5.1)
Potassium: 3.7 mmol/L (ref 3.5–5.1)
Potassium: 3.8 mmol/L (ref 3.5–5.1)
Potassium: 4 mmol/L (ref 3.5–5.1)
Sodium: 154 mmol/L — ABNORMAL HIGH (ref 135–145)
Sodium: 158 mmol/L — ABNORMAL HIGH (ref 135–145)
Sodium: 159 mmol/L — ABNORMAL HIGH (ref 135–145)
Sodium: 159 mmol/L — ABNORMAL HIGH (ref 135–145)
Sodium: 162 mmol/L (ref 135–145)
Sodium: 163 mmol/L (ref 135–145)

## 2019-09-05 LAB — GLUCOSE, CAPILLARY
Glucose-Capillary: >600 mg/dL (ref 70–99)
Glucose-Capillary: 504 mg/dL (ref 70–99)
Glucose-Capillary: 494 mg/dL — ABNORMAL HIGH (ref 70–99)
Glucose-Capillary: 563 mg/dL (ref 70–99)
Glucose-Capillary: 499 mg/dL — ABNORMAL HIGH (ref 70–99)
Glucose-Capillary: 507 mg/dL (ref 70–99)
Glucose-Capillary: 486 mg/dL — ABNORMAL HIGH (ref 70–99)
Glucose-Capillary: 436 mg/dL — ABNORMAL HIGH (ref 70–99)
Glucose-Capillary: 374 mg/dL — ABNORMAL HIGH (ref 70–99)
Glucose-Capillary: 277 mg/dL — ABNORMAL HIGH (ref 70–99)
Glucose-Capillary: 275 mg/dL — ABNORMAL HIGH (ref 70–99)
Glucose-Capillary: 174 mg/dL — ABNORMAL HIGH (ref 70–99)
Glucose-Capillary: 150 mg/dL — ABNORMAL HIGH (ref 70–99)
Glucose-Capillary: 117 mg/dL — ABNORMAL HIGH (ref 70–99)
Glucose-Capillary: 115 mg/dL — ABNORMAL HIGH (ref 70–99)
Glucose-Capillary: 121 mg/dL — ABNORMAL HIGH (ref 70–99)
Glucose-Capillary: 98 mg/dL (ref 70–99)
Glucose-Capillary: 194 mg/dL — ABNORMAL HIGH (ref 70–99)

## 2019-09-05 LAB — URINE DRUG SCREEN, QUALITATIVE (ARMC ONLY)
Amphetamines, Ur Screen: NOT DETECTED
Barbiturates, Ur Screen: NOT DETECTED
Benzodiazepine, Ur Scrn: POSITIVE — AB
Cannabinoid 50 Ng, Ur ~~LOC~~: NOT DETECTED
Cocaine Metabolite,Ur ~~LOC~~: POSITIVE — AB
MDMA (Ecstasy)Ur Screen: NOT DETECTED
Methadone Scn, Ur: NOT DETECTED
Opiate, Ur Screen: NOT DETECTED
Phencyclidine (PCP) Ur S: NOT DETECTED
Tricyclic, Ur Screen: NOT DETECTED

## 2019-09-05 LAB — URINALYSIS, COMPLETE (UACMP) WITH MICROSCOPIC
Bacteria, UA: NONE SEEN
Bilirubin Urine: NEGATIVE
Glucose, UA: >=500 mg/dL — AB
Ketones, ur: 80 mg/dL — AB
Leukocytes,Ua: NEGATIVE
Nitrite: NEGATIVE
Protein, ur: 30 mg/dL — AB
Specific Gravity, Urine: 1.018 (ref 1.005–1.030)
pH: 5 (ref 5.0–8.0)

## 2019-09-05 MED ORDER — POTASSIUM CHLORIDE IN NACL 20-0.45 MEQ/L-% IV SOLN
INTRAVENOUS | Status: DC
  Administered 2019-09-05: 100 mL/h via INTRAVENOUS
  Filled 2019-09-05 (×3): qty 1000

## 2019-09-05 MED ORDER — CHLORHEXIDINE GLUCONATE CLOTH 2 % EX PADS
6.0000 | MEDICATED_PAD | Freq: Every day | CUTANEOUS | Status: DC
  Administered 2019-09-06: 6 via TOPICAL

## 2019-09-05 MED ORDER — PANTOPRAZOLE SODIUM 40 MG IV SOLR
40.0000 mg | Freq: Every day | INTRAVENOUS | Status: DC
  Administered 2019-09-05 – 2019-09-07 (×3): 40 mg via INTRAVENOUS
  Filled 2019-09-05 (×4): qty 40

## 2019-09-05 MED ORDER — POTASSIUM CHLORIDE 10 MEQ/50ML IV SOLN
10.0000 meq | INTRAVENOUS | Status: AC
  Administered 2019-09-05 (×4): 10 meq via INTRAVENOUS
  Filled 2019-09-05 (×4): qty 50

## 2019-09-05 MED ORDER — INSULIN ASPART 100 UNIT/ML ~~LOC~~ SOLN
0.0000 [IU] | SUBCUTANEOUS | Status: DC
  Administered 2019-09-05: 3 [IU] via SUBCUTANEOUS
  Administered 2019-09-06 (×2): 5 [IU] via SUBCUTANEOUS
  Administered 2019-09-06 – 2019-09-07 (×4): 3 [IU] via SUBCUTANEOUS
  Administered 2019-09-07: 5 [IU] via SUBCUTANEOUS
  Administered 2019-09-07 (×3): 3 [IU] via SUBCUTANEOUS
  Administered 2019-09-07: 2 [IU] via SUBCUTANEOUS
  Filled 2019-09-05 (×12): qty 1

## 2019-09-05 MED ORDER — ASPIRIN 300 MG RE SUPP
150.0000 mg | Freq: Every day | RECTAL | Status: DC
  Administered 2019-09-05 – 2019-09-07 (×3): 150 mg via RECTAL
  Filled 2019-09-05 (×3): qty 1

## 2019-09-05 MED ORDER — LACTATED RINGERS IV SOLN
INTRAVENOUS | Status: DC
  Administered 2019-09-05: 100 mL/h via INTRAVENOUS

## 2019-09-05 MED ORDER — FOLIC ACID 5 MG/ML IJ SOLN
1.0000 mg | Freq: Every day | INTRAMUSCULAR | Status: DC
  Administered 2019-09-05 – 2019-09-07 (×3): 1 mg via INTRAVENOUS
  Filled 2019-09-05 (×4): qty 0.2

## 2019-09-05 MED ORDER — POTASSIUM CL IN DEXTROSE 5% 20 MEQ/L IV SOLN
20.0000 meq | INTRAVENOUS | Status: DC
  Administered 2019-09-05 – 2019-09-07 (×4): 20 meq via INTRAVENOUS
  Filled 2019-09-05 (×6): qty 1000

## 2019-09-05 MED ORDER — FREE WATER
300.0000 mL | Status: DC
  Administered 2019-09-05 – 2019-09-08 (×16): 300 mL

## 2019-09-05 MED ORDER — PANTOPRAZOLE SODIUM 40 MG PO TBEC: 40.0000 mg | DELAYED_RELEASE_TABLET | Freq: Every day | ORAL | Status: DC

## 2019-09-05 MED ORDER — SODIUM CHLORIDE 0.9 % IV SOLN
3.0000 g | Freq: Two times a day (BID) | INTRAVENOUS | Status: DC
  Administered 2019-09-05 (×2): 3 g via INTRAVENOUS
  Filled 2019-09-05: qty 8
  Filled 2019-09-05: qty 3
  Filled 2019-09-05 (×2): qty 8
  Filled 2019-09-05: qty 3

## 2019-09-05 MED ORDER — INSULIN GLARGINE 100 UNIT/ML ~~LOC~~ SOLN
14.0000 [IU] | SUBCUTANEOUS | Status: DC
  Administered 2019-09-05 – 2019-09-07 (×3): 14 [IU] via SUBCUTANEOUS
  Filled 2019-09-05 (×4): qty 0.14

## 2019-09-05 MED ORDER — POTASSIUM CHLORIDE 20 MEQ PO PACK
40.0000 meq | PACK | Freq: Once | ORAL | Status: AC
  Administered 2019-09-05: 40 meq
  Filled 2019-09-05: qty 2

## 2019-09-05 NOTE — Progress Notes (Signed)
Patients sister [name] at bedside. She states the patient is living in a rooming house not a group home. He is non-compliant with medicine and self neglect. Patient spends all money on drugs and alcohol per sister. Patient sister states patient can not see very well and is forgetful.
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
## 2019-09-05 NOTE — Progress Notes (Signed)
Pt's BMP @ 0000 revealed Na 154, K 2.9, glucose 813, Bicarb 15, Anion gap 27.  Will keep pt on Bicarb gtt for now, will change IVF from NS to LR, and will hold insulin gtt temporarily why replacing K (will give 40 mEq IV).  Have discussed with nursing, who has paused insulin and will change IVF.  Will follow up with next scheduled BMP in 4 hrs.    Darel Hong, AGACNP-BC [facility] Pager: [phone]
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
## 2019-09-05 NOTE — Progress Notes (Signed)
Pharmacy Antibiotic Note  Spencer [name] is a 58 y.o. male admitted on 09/04/2019 with aspiration pneumonia.  Pharmacy has been consulted for Unasyn dosing.  DKA, encephalopathy Hx of ETOH/crack cocaine  Plan: Transition to Unasyn 3 gm IV q12h (renally adjusted; CrCl 15-29 mL/min)  Height: 5\' 10"  (177.8 cm) Weight: 136 lb 0.4 oz (61.7 kg) IBW/kg (Calculated) : 73  Temp (24hrs), Avg:97.9 F (36.6 C), Min:97.6 F (36.4 C), Max:98.1 F (36.7 C)  Recent Labs  Lab 09/04/19 1633 09/04/19 1833 09/04/19 2000 09/04/19 2355 09/05/19 0350 09/05/19 0809 09/05/19 1238  WBC 26.9*  --   --   --  10.8*  --   --   CREATININE 6.02*  --  5.04* 4.37* 3.80* 3.63* 2.88*  LATICACIDVEN 3.6* 1.5  --   --   --   --   --     Estimated Creatinine Clearance: 23.8 mL/min (A) (by C-G formula based on SCr of 2.88 mg/dL (H)).    No Known Allergies  Antimicrobials this admission: Unasyn 10/29 >>    Dose adjustments this admission: 10/30: Unasyn 3g IV q24h >> Unasyn 3g IV q12h   Microbiology results: 10/29: SARS-CoV-2: NEGATIVE  Thank you for allowing pharmacy to be a part of this patient's care.  Raiford Simmonds 09/05/2019 2:04 PM
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
## 2019-09-05 NOTE — Progress Notes (Signed)
Pt's potassium is now 3.7, bicab 20, and Na 159.  Will resume insulin drip that potassium within normal limits.  Given his hypernatremia and improved bicarb, will d/c Bicarb drip and change IV Fluid to 1/2 NS with 20 K.  Continue to follow BMP q4h. Have discussed with nursing.   Darel Hong, AGACNP-BC [facility] Pager: [phone]

## 2019-09-05 NOTE — Progress Notes (Signed)
Pharmacy Electrolyte Monitoring Consult:  Pharmacy consulted to assist in monitoring and replacing electrolytes in this 58 y.o. male admitted on 09/04/2019 with DKA and substance abuse.   Labs:  Sodium (mmol/L)  Date Value  09/05/2019 163 (HH)   Potassium (mmol/L)  Date Value  09/05/2019 3.8   Magnesium (mg/dL)  Date Value  08/06/2019 3.6 (H)   Phosphorus (mg/dL)  Date Value  07/28/2019 2.4 (L)   Calcium (mg/dL)  Date Value  09/05/2019 7.8 (L)   Albumin (g/dL)  Date Value  09/04/2019 4.4    Assessment/Plan: D5/20Potassium infusing at 73mL/hr. Patient being transitioned off of insulin drip.   Free water flushes ordered at 338mL Q4hr.   Potassium 19mEq VT x 1.   BMP being checked peripherally as sodium continuing to increase despite appropriate interventions.   Electrolytes with am labs.   Will replace to maintain within normal limits.   Pharmacy will continue to monitor and adjust per consult.   Kandice Schmelter L 09/05/2019 4:35 PM
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
## 2019-09-06 DIAGNOSIS — E1310 Other specified diabetes mellitus with ketoacidosis without coma: Secondary | ICD-10-CM

## 2019-09-06 DIAGNOSIS — F05 Delirium due to known physiological condition: Secondary | ICD-10-CM

## 2019-09-06 DIAGNOSIS — E870 Hyperosmolality and hypernatremia: Secondary | ICD-10-CM

## 2019-09-06 LAB — RENAL FUNCTION PANEL
Albumin: 2.8 g/dL — ABNORMAL LOW (ref 3.5–5.0)
Anion gap: 4 — ABNORMAL LOW (ref 5–15)
BUN: 48 mg/dL — ABNORMAL HIGH (ref 6–20)
CO2: 31 mmol/L (ref 22–32)
Calcium: 7.9 mg/dL — ABNORMAL LOW (ref 8.9–10.3)
Chloride: 123 mmol/L — ABNORMAL HIGH (ref 98–111)
Creatinine, Ser: 1.86 mg/dL — ABNORMAL HIGH (ref 0.61–1.24)
GFR calc Af Amer: 45 mL/min — ABNORMAL LOW (ref >60)
GFR calc non Af Amer: 38 mL/min — ABNORMAL LOW (ref >60)
Glucose, Bld: 142 mg/dL — ABNORMAL HIGH (ref 70–99)
Phosphorus: 1.8 mg/dL — ABNORMAL LOW (ref 2.5–4.6)
Potassium: 4 mmol/L (ref 3.5–5.1)
Sodium: 158 mmol/L — ABNORMAL HIGH (ref 135–145)

## 2019-09-06 LAB — CBC WITH DIFFERENTIAL/PLATELET
Abs Immature Granulocytes: 0.05 10*3/uL (ref 0.00–0.07)
Basophils Absolute: 0 10*3/uL (ref 0.0–0.1)
Basophils Relative: 0 %
Eosinophils Absolute: 0.1 10*3/uL (ref 0.0–0.5)
Eosinophils Relative: 1 %
HCT: 32.5 % — ABNORMAL LOW (ref 39.0–52.0)
Hemoglobin: 10.9 g/dL — ABNORMAL LOW (ref 13.0–17.0)
Immature Granulocytes: 1 %
Lymphocytes Relative: 11 %
Lymphs Abs: 0.8 10*3/uL (ref 0.7–4.0)
MCH: 32.1 pg (ref 26.0–34.0)
MCHC: 33.5 g/dL (ref 30.0–36.0)
MCV: 95.6 fL (ref 80.0–100.0)
Monocytes Absolute: 0.4 10*3/uL (ref 0.1–1.0)
Monocytes Relative: 6 %
Neutro Abs: 5.5 10*3/uL (ref 1.7–7.7)
Neutrophils Relative %: 81 %
Platelets: 151 10*3/uL (ref 150–400)
RBC: 3.4 MIL/uL — ABNORMAL LOW (ref 4.22–5.81)
RDW: 13.6 % (ref 11.5–15.5)
WBC: 6.8 10*3/uL (ref 4.0–10.5)
nRBC: 0 % (ref 0.0–0.2)

## 2019-09-06 LAB — GLUCOSE, CAPILLARY
Glucose-Capillary: 156 mg/dL — ABNORMAL HIGH (ref 70–99)
Glucose-Capillary: 171 mg/dL — ABNORMAL HIGH (ref 70–99)
Glucose-Capillary: 183 mg/dL — ABNORMAL HIGH (ref 70–99)
Glucose-Capillary: 203 mg/dL — ABNORMAL HIGH (ref 70–99)
Glucose-Capillary: 217 mg/dL — ABNORMAL HIGH (ref 70–99)
Glucose-Capillary: 82 mg/dL (ref 70–99)

## 2019-09-06 LAB — MAGNESIUM: Magnesium: 2.6 mg/dL — ABNORMAL HIGH (ref 1.7–2.4)

## 2019-09-06 MED ORDER — POTASSIUM & SODIUM PHOSPHATES 280-160-250 MG PO PACK
2.0000 | PACK | ORAL | Status: AC
  Administered 2019-09-06 (×4): 2
  Filled 2019-09-06 (×4): qty 2

## 2019-09-06 MED ORDER — ENOXAPARIN SODIUM 40 MG/0.4ML ~~LOC~~ SOLN
40.0000 mg | SUBCUTANEOUS | Status: DC
  Administered 2019-09-07 – 2019-09-10 (×4): 40 mg via SUBCUTANEOUS
  Filled 2019-09-06 (×4): qty 0.4

## 2019-09-06 MED ORDER — SODIUM CHLORIDE 0.9 % IV SOLN
3.0000 g | Freq: Four times a day (QID) | INTRAVENOUS | Status: AC
  Administered 2019-09-06 – 2019-09-09 (×14): 3 g via INTRAVENOUS
  Filled 2019-09-06 (×2): qty 3
  Filled 2019-09-06: qty 8
  Filled 2019-09-06: qty 3
  Filled 2019-09-06: qty 8
  Filled 2019-09-06 (×5): qty 3
  Filled 2019-09-06: qty 8
  Filled 2019-09-06 (×2): qty 3
  Filled 2019-09-06 (×2): qty 8
  Filled 2019-09-06 (×3): qty 3
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
## 2019-09-06 NOTE — Progress Notes (Signed)
Pharmacy Electrolyte Monitoring Consult:  Pharmacy consulted to assist in monitoring and replacing electrolytes in this 58 y.o. male admitted on 09/04/2019 with DKA and substance abuse.   Labs:  Sodium (mmol/L)  Date Value  09/06/2019 158 (H)   Potassium (mmol/L)  Date Value  09/06/2019 4.0   Magnesium (mg/dL)  Date Value  09/06/2019 2.6 (H)   Phosphorus (mg/dL)  Date Value  09/06/2019 1.8 (L)   Calcium (mg/dL)  Date Value  09/06/2019 7.9 (L)   Albumin (g/dL)  Date Value  09/06/2019 2.8 (L)    Assessment/Plan: K 4.0  Mag 2.6  Phos 1.8  Scr 1.86  Na 158  Patient currently has D5W w/ 20 meq/L KCL infusing at 7mL/hr. Patient being transitioned off of insulin drip.  Free water flushes ordered at 366mL Q4hr.   Will Order K Phos 2 packets per tube q4h x 4 doses  BMP being checked peripherally as sodium continuing to increase despite appropriate interventions.   Electrolytes with am labs.  Will replace to maintain within normal limits.  Pharmacy will continue to monitor and adjust per consult.   Loranda Mastel A 09/06/2019 7:59 AM

## 2019-09-06 NOTE — Progress Notes (Signed)
Pharmacy Antibiotic Note  Spencer [name] is a 58 y.o. male admitted on 09/04/2019 with aspiration pneumonia.  Pharmacy has been consulted for Unasyn dosing.  DKA, encephalopathy Hx of ETOH/crack cocaine  Plan: Day 3- Transition to Unasyn 3 gm IV q6h for improved renal fxn (renally adjusted; CrCl > 30 mL/min)  Height: 5\' 10"  (177.8 cm) Weight: 136 lb 0.4 oz (61.7 kg) IBW/kg (Calculated) : 73  Temp (24hrs), Avg:98.5 F (36.9 C), Min:98 F (36.7 C), Max:99.1 F (37.3 C)  Recent Labs  Lab 09/04/19 1633 09/04/19 1833  09/05/19 0350 09/05/19 0809 09/05/19 1238 09/05/19 1604 09/05/19 1641 09/06/19 0418  WBC 26.9*  --   --  10.8*  --   --   --   --  6.8  CREATININE 6.02*  --    < > 3.80* 3.63* 2.88* 2.54* 2.38* 1.86*  LATICACIDVEN 3.6* 1.5  --   --   --   --   --   --   --    < > = values in this interval not displayed.    Estimated Creatinine Clearance: 36.9 mL/min (A) (by C-G formula based on SCr of 1.86 mg/dL (H)).    No Known Allergies  Antimicrobials this admission: Unasyn 10/29 >>    Dose adjustments this admission: 10/30: Unasyn 3g IV q24h >> Unasyn 3g IV q12h  10/31 Unasyn 3gm q12h to q6h (Crcl 36.9)  Microbiology results: 10/29: SARS-CoV-2: NEGATIVE  Thank you for allowing pharmacy to be a part of this patient's care.  Smith Potenza A 09/06/2019 8:14 AM
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
## 2019-09-07 ENCOUNTER — Other Ambulatory Visit: Payer: Self-pay

## 2019-09-07 DIAGNOSIS — E0810 Diabetes mellitus due to underlying condition with ketoacidosis without coma: Secondary | ICD-10-CM

## 2019-09-07 DIAGNOSIS — G9340 Encephalopathy, unspecified: Secondary | ICD-10-CM

## 2019-09-07 DIAGNOSIS — F1910 Other psychoactive substance abuse, uncomplicated: Secondary | ICD-10-CM

## 2019-09-07 LAB — PHOSPHORUS: Phosphorus: 2.5 mg/dL (ref 2.5–4.6)

## 2019-09-07 LAB — BASIC METABOLIC PANEL
Anion gap: 3 — ABNORMAL LOW (ref 5–15)
BUN: 23 mg/dL — ABNORMAL HIGH (ref 6–20)
CO2: 29 mmol/L (ref 22–32)
Calcium: 8.3 mg/dL — ABNORMAL LOW (ref 8.9–10.3)
Chloride: 117 mmol/L — ABNORMAL HIGH (ref 98–111)
Creatinine, Ser: 1.13 mg/dL (ref 0.61–1.24)
GFR calc Af Amer: >60 mL/min (ref >60)
GFR calc non Af Amer: >60 mL/min (ref >60)
Glucose, Bld: 257 mg/dL — ABNORMAL HIGH (ref 70–99)
Potassium: 3.6 mmol/L (ref 3.5–5.1)
Sodium: 149 mmol/L — ABNORMAL HIGH (ref 135–145)

## 2019-09-07 LAB — MRSA PCR SCREENING: MRSA by PCR: NEGATIVE

## 2019-09-07 LAB — GLUCOSE, CAPILLARY
Glucose-Capillary: 109 mg/dL — ABNORMAL HIGH (ref 70–99)
Glucose-Capillary: 128 mg/dL — ABNORMAL HIGH (ref 70–99)
Glucose-Capillary: 162 mg/dL — ABNORMAL HIGH (ref 70–99)
Glucose-Capillary: 185 mg/dL — ABNORMAL HIGH (ref 70–99)
Glucose-Capillary: 190 mg/dL — ABNORMAL HIGH (ref 70–99)
Glucose-Capillary: 190 mg/dL — ABNORMAL HIGH (ref 70–99)
Glucose-Capillary: 230 mg/dL — ABNORMAL HIGH (ref 70–99)

## 2019-09-07 MED ORDER — CHLORHEXIDINE GLUCONATE CLOTH 2 % EX PADS
6.0000 | MEDICATED_PAD | Freq: Every day | CUTANEOUS | Status: DC
  Administered 2019-09-07 – 2019-09-10 (×4): 6 via TOPICAL

## 2019-09-07 MED ORDER — NICOTINE 14 MG/24HR TD PT24
14.0000 mg | MEDICATED_PATCH | Freq: Every day | TRANSDERMAL | Status: DC
  Administered 2019-09-07 – 2019-09-10 (×4): 14 mg via TRANSDERMAL
  Filled 2019-09-07 (×5): qty 1

## 2019-09-07 NOTE — Progress Notes (Signed)
[facility] [place], [place] 09/07/19  Subjective:   Patient known to our practice from previous admissions as well as office follow-up.  Consult requested by hospitalist for assistance in management of hypernatremia  This time patient was brought to be hospital yesterday by EMS for decreased responsiveness and confusion.  They found patient blood sugar was greater than 600 and patient was admitted for further evaluation and management According to chart notes, patient has history of substance abuse including alcohol and crack cocaine.  Patient's EKG was concerning for STEMI therefore he underwent cardiac catheterization.  He was found to have normal LVEF of 60%, no coronary abnormalities  Patient has significant electrolyte abnormalities including sodium of 158.  Initially patient's creatinine was 6.02 at the time of admission.  His baseline creatinine is 0.66 from October 14.  He had severe hyperkalemia with potassium greater than 7.5.    With medical management, potassium, sodium and creatinine continue to improve   Patient is continuing alcohol abuse and drug abuse at home.  This has been a longstanding problem.  Does not take his medications including insulin.  No acute events reported Lab Results  Component Value Date   CREATININE 1.13 09/07/2019   CREATININE 1.86 (H) 09/06/2019   CREATININE 2.38 (H) 09/05/2019   10/31 0701 - 11/01 0700 In: 4093.3 [I.V.:1893.3; NG/GT:1800; IV Piggyback:400] Out: 1600 [Urine:1600]    Objective:  Vital signs in last 24 hours:  Temp:  [97.7 F (36.5 C)-98.2 F (36.8 C)] 97.7 F (36.5 C) (11/01 0749) Pulse Rate:  [58-82] 66 (11/01 0800) Resp:  [13-31] 18 (11/01 0800) BP: (77-124)/(57-91) 111/88 (11/01 0800) SpO2:  [94 %-100 %] 100 % (11/01 0800)  Weight change:  Filed Weights   09/04/19 1640 09/04/19 1900  Weight: 64.5 kg 61.7 kg    Intake/Output:    Intake/Output Summary (Last 24 hours) at 09/07/2019 0942 Last  data filed at 09/07/2019 0816 Gross per 24 hour  Intake 4651.08 ml  Output 1600 ml  Net 3051.08 ml     Physical Exam: General:  Critically ill-appearing, thin, laying in the bed  HEENT  eyes closed, moist oral mucous membranes, NGT  Neck  no JVD  Pulm/lungs  coarse breath sounds bilaterally, [place] O2  CVS/Heart  regular rhythm  Abdomen:   Soft, nontender  Extremities:  No peripheral edema  Neurologic:  Lethargic, responds to voice. Did not follow commands  Skin:  Warm    Basic Metabolic Panel:  Recent Labs  Lab 09/05/19 1238 09/05/19 1604 09/05/19 1641 09/06/19 0418 09/07/19 0426  NA 162* 163* 159* 158* 149*  K 3.5 3.8 3.7 4.0 3.6  CL 119* 122* 120* 123* 117*  CO2 28 30 29 31 29   GLUCOSE 189* 125* 115* 142* 257*  BUN 65* 63* 61* 48* 23*  CREATININE 2.88* 2.54* 2.38* 1.86* 1.13  CALCIUM 7.9* 7.8* 7.8* 7.9* 8.3*  MG  --   --   --  2.6*  --   PHOS  --   --   --  1.8* 2.5     CBC: Recent Labs  Lab 09/04/19 1633 09/05/19 0350 09/06/19 0418  WBC 26.9* 10.8* 6.8  NEUTROABS 21.3*  --  5.5  HGB 12.7* 11.5* 10.9*  HCT 46.6 33.4* 32.5*  MCV 117.4* 94.1 95.6  PLT 345 216 151     No results found for: HEPBSAG, HEPBSAB, HEPBIGM    Microbiology:  Recent Results (from the past 240 hour(s))  SARS Coronavirus 2 by RT PCR (hospital order,  performed in [facility] hospital lab) Nasopharyngeal Nasopharyngeal Swab     Status: None   Collection Time: 09/04/19  4:33 PM   Specimen: Nasopharyngeal Swab  Result Value Ref Range Status   SARS Coronavirus 2 NEGATIVE NEGATIVE Final    Comment: (NOTE) If result is NEGATIVE SARS-CoV-2 target nucleic acids are NOT DETECTED. The SARS-CoV-2 RNA is generally detectable in upper and lower  respiratory specimens during the acute phase of infection. The lowest  concentration of SARS-CoV-2 viral copies this assay can detect is 250  copies / mL. A negative result does not preclude SARS-CoV-2 infection  and should not be used as the sole  basis for treatment or other  patient management decisions.  A negative result may occur with  improper specimen collection / handling, submission of specimen other  than nasopharyngeal swab, presence of viral mutation(s) within the  areas targeted by this assay, and inadequate number of viral copies  (<250 copies / mL). A negative result must be combined with clinical  observations, patient history, and epidemiological information. If result is POSITIVE SARS-CoV-2 target nucleic acids are DETECTED. The SARS-CoV-2 RNA is generally detectable in upper and lower  respiratory specimens dur ing the acute phase of infection.  Positive  results are indicative of active infection with SARS-CoV-2.  Clinical  correlation with patient history and other diagnostic information is  necessary to determine patient infection status.  Positive results do  not rule out bacterial infection or co-infection with other viruses. If result is PRESUMPTIVE POSTIVE SARS-CoV-2 nucleic acids MAY BE PRESENT.   A presumptive positive result was obtained on the submitted specimen  and confirmed on repeat testing.  While 2019 novel coronavirus  (SARS-CoV-2) nucleic acids may be present in the submitted sample  additional confirmatory testing may be necessary for epidemiological  and / or clinical management purposes  to differentiate between  SARS-CoV-2 and other Sarbecovirus currently known to infect humans.  If clinically indicated additional testing with an alternate test  methodology [phone]) is advised. The SARS-CoV-2 RNA is generally  detectable in upper and lower respiratory sp ecimens during the acute  phase of infection. The expected result is Negative. Fact Sheet for Patients:  StrictlyIdeas.no Fact Sheet for Healthcare Providers: BankingDealers.co.za This test is not yet approved or cleared by the [place] FDA and has been authorized for detection  and/or diagnosis of SARS-CoV-2 by FDA under an Emergency Use Authorization (EUA).  This EUA will remain in effect (meaning this test can be used) for the duration of the COVID-19 declaration under Section 564(b)(1) of the Act, 21 U.S.C. section 360bbb-3(b)(1), unless the authorization is terminated or revoked sooner. Performed at [facility], [address] [place], [place] [address]   MRSA PCR Screening     Status: None   Collection Time: 09/07/19  4:26 AM   Specimen: Nasopharyngeal  Result Value Ref Range Status   MRSA by PCR NEGATIVE NEGATIVE Final    Comment:        The GeneXpert MRSA Assay (FDA approved for NASAL specimens only), is one component of a comprehensive MRSA colonization surveillance program. It is not intended to diagnose MRSA infection nor to guide or monitor treatment for MRSA infections. Performed at [facility], [address] [place], [place] [address]     Coagulation Studies: Recent Labs    09/04/19 1633  LABPROT 13.3  INR 1.0    Urinalysis: Recent Labs    09/04/19 2355  COLORURINE STRAW*  LABSPEC 1.018  PHURINE 5.0  GLUCOSEU >=500*  HGBUR SMALL*  BILIRUBINUR NEGATIVE  KETONESUR 80*  PROTEINUR 30*  NITRITE NEGATIVE  LEUKOCYTESUR NEGATIVE      Imaging: Dg Chest Port 1 View  Result Date: 09/05/2019 CLINICAL DATA:  NG tube placement EXAM: PORTABLE CHEST 1 VIEW COMPARISON:  September 04, 2019 FINDINGS: A right central line is in stable position. No pneumothorax. The NG tube terminates in the stomach. No pneumothorax. The cardiomediastinal silhouette is stable. The lungs remain clear. No clavicular fracture noted. IMPRESSION: Support apparatus as above.  The NG tube is in good position. Electronically Signed   By: Gerome Sam III M.D   On: 09/05/2019 13:34     Medications:   . sodium chloride    . ampicillin-sulbactam (UNASYN) IV Stopped (09/07/19 0457)  . dexmedetomidine (PRECEDEX) IV infusion 0.5 mcg/kg/hr  (09/07/19 0816)  . dextrose 5 % with KCl 20 mEq / L 75 mL/hr at 09/07/19 0816  . insulin Stopped (09/05/19 1759)  . norepinephrine (LEVOPHED) Adult infusion Stopped (09/07/19 0248)   . aspirin  150 mg Rectal Daily  . Chlorhexidine Gluconate Cloth  6 each Topical Q0600  . enoxaparin (LOVENOX) injection  40 mg Subcutaneous Q24H  . folic acid  1 mg Intravenous Daily  . free water  300 mL Per Tube Q4H  . insulin aspart  0-15 Units Subcutaneous Q4H  . insulin glargine  14 Units Subcutaneous Q24H  . multivitamin with minerals  1 tablet Oral Daily  . pantoprazole (PROTONIX) IV  40 mg Intravenous Daily  . sodium chloride flush  3 mL Intravenous Q12H  . thiamine injection  100 mg Intravenous Daily   sodium chloride, acetaminophen, LORazepam, ondansetron (ZOFRAN) IV, sodium chloride flush  Assessment/ Plan:  58 y.o. Caucasian male with history of diabetes mellitus, substance abuse including alcohol and cocaine, GERD, hypertension, history of left frontal subdural hematoma August 2011, history of left bur hole, basal ganglia lacunar infarcts and small vessel ischemic disease, presented on 09/04/2019 for altered mental status, severe hyperglycemia, hypercalcemia, STEMI  #Acute kidney injury -Likely prerenal and ATN secondary to substance abuse, hypotension and hemodynamic instability -Serum creatinine is improving with IV hydration - Good UOP  Lab Results  Component Value Date   CREATININE 1.13 09/07/2019   CREATININE 1.86 (H) 09/06/2019   CREATININE 2.38 (H) 09/05/2019     #Hyperkalemia -Again secondary to hyperglycemia and electrolyte shifts -Currently in normal range Lab Results  Component Value Date   K 3.6 09/07/2019     #Hypernatremia with sodium of 149 -Urine output  1600 cc -Continue  free water replacement via NG tube.  Currently getting 300 cc every 4 hours -IV D5W with 20 of KCl at 75 cc/h- will discontinue  Will sign off Please re-consult as necessary    LOS:  3 Tikia Skilton 11/1/20209:42 AM  [facility] [place], [place] [phone]  Note: This note was prepared with Dragon dictation. Any transcription errors are unintentional

## 2019-09-07 NOTE — Progress Notes (Signed)
Pharmacy Electrolyte Monitoring Consult:  Pharmacy consulted to assist in monitoring and replacing electrolytes in this 58 y.o. male admitted on 09/04/2019 with DKA and substance abuse.   Labs:  Sodium (mmol/L)  Date Value  09/07/2019 149 (H)   Potassium (mmol/L)  Date Value  09/07/2019 3.6   Magnesium (mg/dL)  Date Value  09/06/2019 2.6 (H)   Phosphorus (mg/dL)  Date Value  09/07/2019 2.5   Calcium (mg/dL)  Date Value  09/07/2019 8.3 (L)   Albumin (g/dL)  Date Value  09/06/2019 2.8 (L)    Assessment/Plan: K 3.6   Phos 2.5  Scr 1.13  Na 149  Patient being transitioned off of insulin drip.  Free water flushes ordered at 380mL Q4hr.  No electrolyte replacement warranted at this time  BMP being checked peripherally as sodium continuing to increase despite appropriate interventions.   Electrolytes with am labs.  Will replace to maintain within normal limits.  Pharmacy will continue to monitor and adjust per consult.   Blimie Vaness A 09/07/2019 10:53 AM

## 2019-09-07 NOTE — Progress Notes (Signed)
Pharmacy Antibiotic Note  Spencer [name] is a 58 y.o. male admitted on 09/04/2019 with aspiration pneumonia.  Pharmacy has been consulted for Unasyn dosing.  DKA, encephalopathy Hx of ETOH/crack cocaine  Plan: Day 4-  Unasyn 3 gm IV q6h    Height: 5\' 10"  (177.8 cm) Weight: 136 lb 0.4 oz (61.7 kg) IBW/kg (Calculated) : 73  Temp (24hrs), Avg:97.9 F (36.6 C), Min:97.7 F (36.5 C), Max:98.2 F (36.8 C)  Recent Labs  Lab 09/04/19 1633 09/04/19 1833  09/05/19 0350  09/05/19 1238 09/05/19 1604 09/05/19 1641 09/06/19 0418 09/07/19 0426  WBC 26.9*  --   --  10.8*  --   --   --   --  6.8  --   CREATININE 6.02*  --    < > 3.80*   < > 2.88* 2.54* 2.38* 1.86* 1.13  LATICACIDVEN 3.6* 1.5  --   --   --   --   --   --   --   --    < > = values in this interval not displayed.    Estimated Creatinine Clearance: 60.7 mL/min (by C-G formula based on SCr of 1.13 mg/dL).    No Known Allergies  Antimicrobials this admission: Unasyn 10/29 >>    Dose adjustments this admission: 10/30: Unasyn 3g IV q24h >> Unasyn 3g IV q12h  10/31 Unasyn 3gm q12h to q6h (Crcl 36.9)  Microbiology results: 10/29: SARS-CoV-2: NEGATIVE  Thank you for allowing pharmacy to be a part of this patient's care.  Mahdiya Mossberg A 09/07/2019 11:08 AM
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
## 2019-09-08 LAB — BASIC METABOLIC PANEL
Anion gap: 5 (ref 5–15)
BUN: 17 mg/dL (ref 6–20)
CO2: 25 mmol/L (ref 22–32)
Calcium: 8.7 mg/dL — ABNORMAL LOW (ref 8.9–10.3)
Chloride: 117 mmol/L — ABNORMAL HIGH (ref 98–111)
Creatinine, Ser: 0.84 mg/dL (ref 0.61–1.24)
GFR calc Af Amer: >60 mL/min (ref >60)
GFR calc non Af Amer: >60 mL/min (ref >60)
Glucose, Bld: 111 mg/dL — ABNORMAL HIGH (ref 70–99)
Potassium: 3.6 mmol/L (ref 3.5–5.1)
Sodium: 147 mmol/L — ABNORMAL HIGH (ref 135–145)

## 2019-09-08 LAB — MAGNESIUM: Magnesium: 2.2 mg/dL (ref 1.7–2.4)

## 2019-09-08 LAB — GLUCOSE, CAPILLARY
Glucose-Capillary: 30 mg/dL — CL (ref 70–99)
Glucose-Capillary: 88 mg/dL (ref 70–99)
Glucose-Capillary: 90 mg/dL (ref 70–99)
Glucose-Capillary: 324 mg/dL — ABNORMAL HIGH (ref 70–99)
Glucose-Capillary: 181 mg/dL — ABNORMAL HIGH (ref 70–99)
Glucose-Capillary: 303 mg/dL — ABNORMAL HIGH (ref 70–99)
Glucose-Capillary: 325 mg/dL — ABNORMAL HIGH (ref 70–99)

## 2019-09-08 LAB — CBC
HCT: 33.2 % — ABNORMAL LOW (ref 39.0–52.0)
Hemoglobin: 10.7 g/dL — ABNORMAL LOW (ref 13.0–17.0)
MCH: 31.1 pg (ref 26.0–34.0)
MCHC: 32.2 g/dL (ref 30.0–36.0)
MCV: 96.5 fL (ref 80.0–100.0)
Platelets: 95 10*3/uL — ABNORMAL LOW (ref 150–400)
RBC: 3.44 MIL/uL — ABNORMAL LOW (ref 4.22–5.81)
RDW: 13.2 % (ref 11.5–15.5)
WBC: 4.8 10*3/uL (ref 4.0–10.5)
nRBC: 0 % (ref 0.0–0.2)

## 2019-09-08 LAB — PHOSPHORUS: Phosphorus: 3.3 mg/dL (ref 2.5–4.6)

## 2019-09-08 MED ORDER — INSULIN GLARGINE 100 UNIT/ML ~~LOC~~ SOLN
12.0000 [IU] | Freq: Every day | SUBCUTANEOUS | Status: DC
  Administered 2019-09-08: 12 [IU] via SUBCUTANEOUS
  Filled 2019-09-08 (×2): qty 0.12

## 2019-09-08 MED ORDER — DEXTROSE 50 % IV SOLN
25.0000 g | INTRAVENOUS | Status: AC
  Administered 2019-09-08: 04:00:00 25 g via INTRAVENOUS

## 2019-09-08 MED ORDER — INSULIN ASPART 100 UNIT/ML ~~LOC~~ SOLN
0.0000 [IU] | Freq: Three times a day (TID) | SUBCUTANEOUS | Status: DC
  Administered 2019-09-08: 7 [IU] via SUBCUTANEOUS
  Administered 2019-09-08: 16:00:00 2 [IU] via SUBCUTANEOUS
  Administered 2019-09-09 – 2019-09-10 (×4): 5 [IU] via SUBCUTANEOUS
  Administered 2019-09-10: 2 [IU] via SUBCUTANEOUS
  Filled 2019-09-08 (×7): qty 1

## 2019-09-08 MED ORDER — DEXTROSE 50 % IV SOLN
INTRAVENOUS | Status: AC
  Filled 2019-09-08: qty 50

## 2019-09-08 MED ORDER — VITAMIN B-1 100 MG PO TABS
100.0000 mg | ORAL_TABLET | Freq: Every day | ORAL | Status: DC
  Administered 2019-09-08 – 2019-09-10 (×3): 100 mg via ORAL
  Filled 2019-09-08 (×3): qty 1

## 2019-09-08 MED ORDER — ASPIRIN EC 81 MG PO TBEC
81.0000 mg | DELAYED_RELEASE_TABLET | Freq: Every day | ORAL | Status: DC
  Administered 2019-09-08 – 2019-09-10 (×3): 81 mg via ORAL
  Filled 2019-09-08 (×3): qty 1

## 2019-09-08 MED ORDER — FOLIC ACID 1 MG PO TABS
1.0000 mg | ORAL_TABLET | Freq: Every day | ORAL | Status: DC
  Administered 2019-09-08 – 2019-09-10 (×3): 1 mg via ORAL
  Filled 2019-09-08 (×3): qty 1

## 2019-09-08 MED ORDER — INSULIN ASPART 100 UNIT/ML ~~LOC~~ SOLN
0.0000 [IU] | Freq: Every day | SUBCUTANEOUS | Status: DC
  Administered 2019-09-08: 23:00:00 4 [IU] via SUBCUTANEOUS
  Administered 2019-09-09: 22:00:00 2 [IU] via SUBCUTANEOUS
  Filled 2019-09-08 (×2): qty 1

## 2019-09-08 MED ORDER — PANTOPRAZOLE SODIUM 40 MG PO TBEC
40.0000 mg | DELAYED_RELEASE_TABLET | Freq: Every day | ORAL | Status: DC
  Administered 2019-09-08 – 2019-09-10 (×3): 40 mg via ORAL
  Filled 2019-09-08 (×3): qty 1

## 2019-09-08 NOTE — Progress Notes (Addendum)
Inpatient Diabetes Program Recommendations  AACE/ADA: New Consensus Statement on Inpatient Glycemic Control   Target Ranges:  Prepandial:   less than 140 mg/dL      Peak postprandial:   less than 180 mg/dL (1-2 hours)      Critically ill patients:  140 - 180 mg/dL  Results for Spencer [name], Spencer [name] (MRN [id number]) as of 09/08/2019 11:01  Ref. Range 09/08/2019 03:28 09/08/2019 03:31 09/08/2019 03:54 09/08/2019 07:56  Glucose-Capillary Latest Ref Range: 70 - 99 mg/dL 20 (LL) 30 (LL) 88 90   Results for Spencer [name], Spencer [name] (MRN [id number]) as of 09/08/2019 11:01  Ref. Range 09/07/2019 07:18 09/07/2019 11:32 09/07/2019 16:08 09/07/2019 19:35 09/07/2019 23:41  Glucose-Capillary Latest Ref Range: 70 - 99 mg/dL 185 (H)  Novolog 3 units 230 (H)  Novolog 5 units 109 (H)   Lantus 14 units @18 :46 190 (H)  Novolog 3 units 190 (H)  Novolog 3 units   Review of Glycemic Control  Outpatient Diabetes medications:Lantus 22 units daily, Novolog 0-30 units TID with meals, Metformin 1000 mg BID Current orders for Inpatient glycemic control:Novolog 0-15 units Q4H Inpatient Diabetes Program Recommendations:  Insulin-Basal: Patient last received Lantus 14 units at 18:46 on 09/07/19 and Lantus was discontinued. Anticipate hypoglycemia more likely due to Novolog correction Q4H moderate scale. Please reorder Lantus at 12 units QHS (slightly lower dose).  Insulin-Correction: Please consider decreasing Novolog correction to Novolog 0-9 units TID with meals and Novolog 0-5 units QHS.  Insulin Meal Coverage:If postprandial glucose becomes consistently greater than 180 mg/dl, please consider ordering Novolog 2 units TID with meals for meal coverage if patient eats at least 50% of meals.  Outpatient insulin: Please consider adjusting outpatient Novolog scale to 0-9 units TID with meals (same scale as inpatient use).  NOTE: Patient is well known to inpatient diabetes team. Patient was just inpatient 08/07/19 to 08/08/19 and  08/19/19 to 08/21/19 and was last seen by diabetes coordinator on 08/08/19. Recommend adjusting outpatient Novolog to similar regimen used as an inpatient to help improve outpatient DM control and encourage patient to re-establish care with Endocrinologist.Noted patient cocaine positive(07/27/19, 08/07/19, 08/19/19, and 09/04/19)whichis likelyeffecting patient's ability and willingness to manage DM.  Thanks, Barnie Alderman, RN, MSN, CDE Diabetes Coordinator Inpatient Diabetes Program [phone] (Team Pager from 8am to 5pm)

## 2019-09-08 NOTE — Progress Notes (Signed)
Pharmacy Electrolyte Monitoring Consult:  Pharmacy consulted to assist in monitoring and replacing electrolytes in this 58 y.o. male admitted on 09/04/2019. PMH includes: substance abuse, previous brain aneurysm, diabetes, GERD, HTN. Admitted to ICU for increased WOB, severe acidosis, and severe respiratory distress with severe DKA with metabolic derangements.   Labs:  Sodium (mmol/L)  Date Value  09/08/2019 147 (H)   Potassium (mmol/L)  Date Value  09/08/2019 3.6   Magnesium (mg/dL)  Date Value  09/08/2019 2.2   Phosphorus (mg/dL)  Date Value  09/08/2019 3.3   Calcium (mg/dL)  Date Value  09/08/2019 8.7 (L)   Albumin (g/dL)  Date Value  09/06/2019 2.8 (L)   Corrected Calcium: 9.7 mg/dL  Assessment/Plan:  - Potassium 50mEq PO x 1 - Na continues to trend down, will monitor daily. - Free water flushes were discontinued. - Electrolytes to be checked with AM labs.  Goals: Maintain electrolytes within normal limits; will continue to replace for goal magnesium ~ 2 in setting of continued alcohol abuse/withdrawal.  Pharmacy will continue to monitor and adjust per consult.   Spencer [name] 09/08/2019 3:06 PM
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
## 2019-09-08 NOTE — Progress Notes (Signed)
Pharmacy Antibiotic Note  Spencer [name] is a 58 y.o. male admitted on 09/04/2019 with aspiration pneumonia.  PMH includes: substance abuse, previous brain aneurysm, diabetes, GERD, HTN.  Admitted to ICU for increased WOB, severe acidosis, and severe respiratory distress with severe DKA with metabolic derangements.   Pharmacy has been consulted for Unasyn dosing.  Plan: 11/2: Day 5-  Unasyn 3g IV q6h   Patient to complete Unasyn therapy on 11/3. Pharmacy will complete consult at this time.   Height: 5\' 10"  (177.8 cm) Weight: 136 lb 0.4 oz (61.7 kg) IBW/kg (Calculated) : 73  Temp (24hrs), Avg:97.7 F (36.5 C), Min:96.5 F (35.8 C), Max:98.7 F (37.1 C)  Recent Labs  Lab 09/04/19 1633 09/04/19 1833  09/05/19 0350  09/05/19 1604 09/05/19 1641 09/06/19 0418 09/07/19 0426 09/08/19 0507  WBC 26.9*  --   --  10.8*  --   --   --  6.8  --  4.8  CREATININE 6.02*  --    < > 3.80*   < > 2.54* 2.38* 1.86* 1.13 0.84  LATICACIDVEN 3.6* 1.5  --   --   --   --   --   --   --   --    < > = values in this interval not displayed.    Estimated Creatinine Clearance: 81.6 mL/min (by C-G formula based on SCr of 0.84 mg/dL).    No Known Allergies  Antimicrobials this admission: Unasyn 10/29 >>  11/3  Dose adjustments this admission: 10/30: Unasyn 3g IV q24h >> Unasyn 3g IV q12h  10/31 Unasyn 3gm q12h to q6h (CrCl 36.9 mL/min)  Microbiology results: 10/29: SARS-CoV-2: NEGATIVE  Thank you for allowing pharmacy to be a part of this patient's care.  Raiford Simmonds, PharmD Candidate 09/08/2019 2:57 PM

## 2019-09-09 ENCOUNTER — Encounter: Payer: Self-pay | Admitting: Internal Medicine

## 2019-09-09 LAB — BASIC METABOLIC PANEL
Anion gap: 9 (ref 5–15)
BUN: 18 mg/dL (ref 6–20)
CO2: 23 mmol/L (ref 22–32)
Calcium: 9 mg/dL (ref 8.9–10.3)
Chloride: 109 mmol/L (ref 98–111)
Creatinine, Ser: 1.03 mg/dL (ref 0.61–1.24)
GFR calc Af Amer: >60 mL/min (ref >60)
GFR calc non Af Amer: >60 mL/min (ref >60)
Glucose, Bld: 352 mg/dL — ABNORMAL HIGH (ref 70–99)
Potassium: 3.4 mmol/L — ABNORMAL LOW (ref 3.5–5.1)
Sodium: 141 mmol/L (ref 135–145)

## 2019-09-09 LAB — MAGNESIUM: Magnesium: 2.1 mg/dL (ref 1.7–2.4)

## 2019-09-09 LAB — GLUCOSE, CAPILLARY
Glucose-Capillary: 20 mg/dL — CL (ref 70–99)
Glucose-Capillary: 298 mg/dL — ABNORMAL HIGH (ref 70–99)
Glucose-Capillary: 261 mg/dL — ABNORMAL HIGH (ref 70–99)
Glucose-Capillary: 300 mg/dL — ABNORMAL HIGH (ref 70–99)
Glucose-Capillary: 227 mg/dL — ABNORMAL HIGH (ref 70–99)

## 2019-09-09 LAB — CBC WITH DIFFERENTIAL/PLATELET
Abs Immature Granulocytes: 0.05 10*3/uL (ref 0.00–0.07)
Basophils Absolute: 0 10*3/uL (ref 0.0–0.1)
Basophils Relative: 0 %
Eosinophils Absolute: 0.1 10*3/uL (ref 0.0–0.5)
Eosinophils Relative: 1 %
HCT: 40.7 % (ref 39.0–52.0)
Hemoglobin: 14 g/dL (ref 13.0–17.0)
Immature Granulocytes: 1 %
Lymphocytes Relative: 18 %
Lymphs Abs: 1 10*3/uL (ref 0.7–4.0)
MCH: 31.8 pg (ref 26.0–34.0)
MCHC: 34.4 g/dL (ref 30.0–36.0)
MCV: 92.5 fL (ref 80.0–100.0)
Monocytes Absolute: 0.2 10*3/uL (ref 0.1–1.0)
Monocytes Relative: 3 %
Neutro Abs: 4.3 10*3/uL (ref 1.7–7.7)
Neutrophils Relative %: 77 %
Platelets: 118 10*3/uL — ABNORMAL LOW (ref 150–400)
RBC: 4.4 MIL/uL (ref 4.22–5.81)
RDW: 12.5 % (ref 11.5–15.5)
WBC: 5.6 10*3/uL (ref 4.0–10.5)
nRBC: 0 % (ref 0.0–0.2)

## 2019-09-09 MED ORDER — POLYETHYLENE GLYCOL 3350 17 G PO PACK
17.0000 g | PACK | Freq: Every day | ORAL | Status: DC | PRN
  Administered 2019-09-09: 17 g via ORAL
  Filled 2019-09-09: qty 1

## 2019-09-09 MED ORDER — LISINOPRIL 5 MG PO TABS
5.0000 mg | ORAL_TABLET | Freq: Every day | ORAL | Status: DC
  Administered 2019-09-09 – 2019-09-10 (×2): 5 mg via ORAL
  Filled 2019-09-09 (×2): qty 1

## 2019-09-09 MED ORDER — INSULIN GLARGINE 100 UNIT/ML ~~LOC~~ SOLN
14.0000 [IU] | Freq: Every day | SUBCUTANEOUS | Status: DC
  Filled 2019-09-09: qty 0.14

## 2019-09-09 MED ORDER — POTASSIUM CHLORIDE CRYS ER 20 MEQ PO TBCR
40.0000 meq | EXTENDED_RELEASE_TABLET | Freq: Once | ORAL | Status: AC
  Administered 2019-09-09: 10:00:00 40 meq via ORAL
  Filled 2019-09-09: qty 2

## 2019-09-09 MED ORDER — INSULIN GLARGINE 100 UNIT/ML ~~LOC~~ SOLN
20.0000 [IU] | Freq: Every day | SUBCUTANEOUS | Status: DC
  Administered 2019-09-09: 20 [IU] via SUBCUTANEOUS
  Filled 2019-09-09 (×2): qty 0.2

## 2019-09-09 MED ORDER — TRAZODONE HCL 50 MG PO TABS
50.0000 mg | ORAL_TABLET | Freq: Once | ORAL | Status: AC
  Administered 2019-09-09: 50 mg via ORAL
  Filled 2019-09-09: qty 1

## 2019-09-09 MED ORDER — INSULIN ASPART 100 UNIT/ML ~~LOC~~ SOLN
3.0000 [IU] | Freq: Three times a day (TID) | SUBCUTANEOUS | Status: DC
  Administered 2019-09-09 – 2019-09-10 (×4): 3 [IU] via SUBCUTANEOUS
  Filled 2019-09-09 (×4): qty 1

## 2019-09-09 NOTE — TOC Progression Note (Signed)
Transition of Care [facility]) - Progression Note    Patient Details  Name: TRISTIAN SICKINGER MRN: [id number] Date of Birth: [birth year]  Transition of Care [facility]) CM/SW Contact  Shelbie Hutching, RN Phone Number: 09/09/2019, 4:26 PM  Clinical Narrative:    Patient requested to speak with [facility] about discharge planning.  Patient reports that he wants to straighten up and change his living situation.  He reports that the boarding house he lives in is not in good shape.  Patient mentions assisted living or a place that can help him remember to take his medications.   RNCM provided patient with a list of ALF/FCH facilities in [place] to review.  RNCM discussed the possibility of a group home living but patient was not interested in having his check taken and given to the home for his care.  RNCM will follow up with patient tomorrow.     Expected Discharge Plan: Home/Self Care Barriers to Discharge: Continued Medical Work up  Expected Discharge Plan and Services Expected Discharge Plan: Home/Self Care     Post Acute Care Choice: NA Living arrangements for the past 2 months: [place] (SDOH) Interventions    Readmission Risk Interventions Readmission Risk Prevention Plan 09/05/2019 08/21/2019 02/19/2019  Transportation Screening - Complete Complete  PCP or Specialist Appt within 3-5 Days - - Complete  HRI or Home Care Consult - - Not Complete  HRI or Home Care Consult comments - - Patient does not meet homebound status  Palliative Care Screening - - Not Applicable  Medication Review (RN Care Manager) - Complete Complete  PCP or Specialist appointment within 3-5 days of discharge Complete - -  HRI or [place] - - -  SW Recovery Care/Counseling Consult - Complete -  SW Consult Not Complete Comments - - -  Palliative Care Screening Not Applicable Not Applicable -  [place] Not  Applicable Not Applicable -  Some recent data might be hidden

## 2019-09-09 NOTE — Progress Notes (Addendum)
Pharmacy Electrolyte Monitoring Consult:  Pharmacy consulted to assist in monitoring and replacing electrolytes in this 58 y.o. male admitted on 09/04/2019. PMH includes: substance abuse, previous brain aneurysm, diabetes, GERD, HTN. Admitted to ICU for increased WOB, severe acidosis, and severe respiratory distress with severe DKA with metabolic derangements. Patient transferred to 1C on 11/2.   Labs:  Sodium (mmol/L)  Date Value  09/09/2019 141   Potassium (mmol/L)  Date Value  09/09/2019 3.4 (L)   Magnesium (mg/dL)  Date Value  09/09/2019 2.1   Phosphorus (mg/dL)  Date Value  09/08/2019 3.3   Calcium (mg/dL)  Date Value  09/09/2019 9.0   Albumin (g/dL)  Date Value  09/06/2019 2.8 (L)   Corrected Calcium: 9.7 mg/dL  Assessment/Plan:  - Potassium 55mEq PO x 1. - Na continues to trend down, will monitor daily. - BMP with am labs.  - Will adjust glargine to 14 units Qhs and add Aspart 3 units TIDWM (when patient eats at least 50% of meal). Continue SSI as ordered.   Goals: Maintain electrolytes within normal limits; will continue to replace for goal magnesium ~ 2 in setting of continued alcohol abuse/withdrawal.  Pharmacy will continue to monitor and adjust per consult.   Darry Kelnhofer L 09/09/2019 8:53 AM

## 2019-09-09 NOTE — Progress Notes (Signed)
Inpatient Diabetes Program Recommendations  AACE/ADA: New Consensus Statement on Inpatient Glycemic Control   Target Ranges:  Prepandial:   less than 140 mg/dL      Peak postprandial:   less than 180 mg/dL (1-2 hours)      Critically ill patients:  140 - 180 mg/dL   Results for STEPHENS, SHREVE (MRN [id number]) as of 09/09/2019 08:28  Ref. Range 09/08/2019 03:28 09/08/2019 03:31 09/08/2019 03:54 09/08/2019 07:56 09/08/2019 11:38 09/08/2019 16:07 09/08/2019 19:58 09/08/2019 22:00 09/09/2019 07:19  Glucose-Capillary Latest Ref Range: 70 - 99 mg/dL 20 (LL) 30 (LL) 88 90 324 (H) 181 (H) 303 (H) 325 (H) 298 (H)   Review of Glycemic Control  Outpatient Diabetes medications:Lantus 22 units daily, Novolog 0-30 units TID with meals, Metformin 1000 mg BID Current orders for Inpatient glycemic control: Lantus 12 units QHS,Novolog 0-9 units TID with meals, Novolog 0-5 units QHS  Inpatient Diabetes Program Recommendations:  Insulin-Basal:  Please consider increasing Lantus to 14 units QHS.  Insulin Meal Coverage:Please consider ordering Novolog 3 units TID with meals for meal coverage if patient eats at least 50% of meals.  Thanks, Barnie Alderman, RN, MSN, CDE Diabetes Coordinator Inpatient Diabetes Program [phone] (Team Pager from 8am to 5pm)

## 2019-09-09 NOTE — Progress Notes (Signed)
Progress Note    TIN ENGRAM  YQM:[id number] DOB: 04/26/1961  DOA: 09/04/2019 PCP: [name], MD      Brief Narrative:    Medical records reviewed and are as summarized below:  Spencer [name] is an 58 y.o. male who presented to the hospital with altered mental status.  He was found to have severe diabetic ketoacidosis, acute kidney injury and other metabolic derangements.  Initially there was concern for acute MI so he underwent emergent left heart catheterization but this was unremarkable.  He was admitted to the ICU where he was treated with IV fluids and insulin infusion.  His condition improved significantly so he was downgraded from ICU status to the medical unit and the hospitalist team was consulted to continue with medical treatment.      Assessment/Plan:   Active Problems:   DKA (diabetic ketoacidoses) (HCC)   Tobacco abuse counseling   Body mass index is 19.52 kg/m.    Type 2 diabetes mellitus with hyperglycemia, s/p DKA: Increase Lantus from 14 units to 20 units nightly for adequate glucose control.  Continue Humalog.  Monitor glucose levels closely.  Consult diabetes educator for counseling.  Cultures have been negative thus far.  Discontinue Unasyn after today.  Hypokalemia: Replete potassium  Hypertension: Start low-dose lisinopril  Acute hypoxemic and hypercapnic respiratory failure: Resolved  Metabolic encephalopathy: Resolved  AKI, hyperkalemia and hyponatremia: Resolved.  Constipation: MiraLAX as needed.  Medical nonadherence: The importance of medical adherence was emphasized.  Case was discussed with intensivist, Dr. Karna Christmas.   Family Communication/Anticipated D/C date and plan/Code Status   DVT prophylaxis: Lovenox Code Status: Full code Family Communication:  Disposition Plan: Discharge to home in 1 to 2 days      Subjective:   C/o tiredness from lack of sleep.  He also complains of constipation.  Patient said he  takes 22 units of Lantus at home but he does not take it every day.  He also reports history of hypoglycemia in the past but he said most of the time his sugar stays on the high side.  Objective:    Vitals:   09/08/19 1700 09/08/19 1800 09/08/19 2000 09/09/19 0418  BP:  (!) 168/99  (!) 151/97  Pulse: 87 92 84 91  Resp: 19 14 17 16   Temp:   98 F (36.7 C) 98.2 F (36.8 C)  TempSrc:   Oral Oral  SpO2: 100% 100% 100% 97%  Weight:      Height:        Intake/Output Summary (Last 24 hours) at 09/09/2019 1234 Last data filed at 09/09/2019 0644 Gross per 24 hour  Intake 487.53 ml  Output 3600 ml  Net -3112.47 ml   Filed Weights   09/04/19 1640 09/04/19 1900  Weight: 64.5 kg 61.7 kg    Exam:  GEN: NAD SKIN: No rash.  He has ?skin tags on his lower back EYES: EOMI ENT: MMM CV: RRR PULM: CTA B ABD: soft, ND, NT, +BS CNS: AAO x 3, non focal EXT: No edema or tenderness   Data Reviewed:   I have personally reviewed following labs and imaging studies:  Labs: Labs show the following:   Basic Metabolic Panel: Recent Labs  Lab 09/05/19 1641 09/06/19 0418 09/07/19 0426 09/08/19 0507 09/09/19 0512  NA 159* 158* 149* 147* 141  K 3.7 4.0 3.6 3.6 3.4*  CL 120* 123* 117* 117* 109  CO2 29 31 29 25 23   GLUCOSE 115* 142* 257*  111* 352*  BUN 61* 48* 23* 17 18  CREATININE 2.38* 1.86* 1.13 0.84 1.03  CALCIUM 7.8* 7.9* 8.3* 8.7* 9.0  MG  --  2.6*  --  2.2 2.1  PHOS  --  1.8* 2.5 3.3  --    GFR Estimated Creatinine Clearance: 66.6 mL/min (by C-G formula based on SCr of 1.03 mg/dL). Liver Function Tests: Recent Labs  Lab 09/04/19 1633 09/06/19 0418  AST 14*  --   ALT 14  --   ALKPHOS 136*  --   BILITOT 2.4*  --   PROT 7.1  --   ALBUMIN 4.4 2.8*   No results for input(s): LIPASE, AMYLASE in the last 168 hours. No results for input(s): AMMONIA in the last 168 hours. Coagulation profile Recent Labs  Lab 09/04/19 1633  INR 1.0    CBC: Recent Labs  Lab 09/04/19  1633 09/05/19 0350 09/06/19 0418 09/08/19 0507 09/09/19 0512  WBC 26.9* 10.8* 6.8 4.8 5.6  NEUTROABS 21.3*  --  5.5  --  4.3  HGB 12.7* 11.5* 10.9* 10.7* 14.0  HCT 46.6 33.4* 32.5* 33.2* 40.7  MCV 117.4* 94.1 95.6 96.5 92.5  PLT 345 216 151 95* 118*   Cardiac Enzymes: No results for input(s): CKTOTAL, CKMB, CKMBINDEX, TROPONINI in the last 168 hours. BNP (last 3 results) No results for input(s): PROBNP in the last 8760 hours. CBG: Recent Labs  Lab 09/08/19 1607 09/08/19 1958 09/08/19 2200 09/09/19 0719 09/09/19 1211  GLUCAP 181* 303* 325* 298* 261*   D-Dimer: No results for input(s): DDIMER in the last 72 hours. Hgb A1c: No results for input(s): HGBA1C in the last 72 hours. Lipid Profile: No results for input(s): CHOL, HDL, LDLCALC, TRIG, CHOLHDL, LDLDIRECT in the last 72 hours. Thyroid function studies: No results for input(s): TSH, T4TOTAL, T3FREE, THYROIDAB in the last 72 hours.  Invalid input(s): FREET3 Anemia work up: No results for input(s): VITAMINB12, FOLATE, FERRITIN, TIBC, IRON, RETICCTPCT in the last 72 hours. Sepsis Labs: Recent Labs  Lab 09/04/19 1633 09/04/19 1833 09/05/19 0350 09/06/19 0418 09/08/19 0507 09/09/19 0512  WBC 26.9*  --  10.8* 6.8 4.8 5.6  LATICACIDVEN 3.6* 1.5  --   --   --   --     Microbiology Recent Results (from the past 240 hour(s))  SARS Coronavirus 2 by RT PCR (hospital order[facility]) Nasopharyngeal Nasopharyngeal Swab     Status: None   Collection Time: 09/04/19  4:33 PM   Specimen: Nasopharyngeal Swab  Result Value Ref Range Status   SARS Coronavirus 2 NEGATIVE NEGATIVE Final    Comment: (NOTE) If result is NEGATIVE SARS-CoV-2 target nucleic acids are NOT DETECTED. The SARS-CoV-2 RNA is generally detectable in upper and lower  respiratory specimens during the acute phase of infection. The lowest  concentration of SARS-CoV-2 viral copies this assay can detect is 250  copies / mL. A  negative result does not preclude SARS-CoV-2 infection  and should not be used as the sole basis for treatment or other  patient management decisions.  A negative result may occur with  improper specimen collection / handling, submission of specimen other  than nasopharyngeal swab, presence of viral mutation(s) within the  areas targeted by this assay, and inadequate number of viral copies  (<250 copies / mL). A negative result must be combined with clinical  observations, patient history, and epidemiological information. If result is POSITIVE SARS-CoV-2 target nucleic acids are DETECTED. The SARS-CoV-2 RNA is generally detectable  in upper and lower  respiratory specimens dur ing the acute phase of infection.  Positive  results are indicative of active infection with SARS-CoV-2.  Clinical  correlation with patient history and other diagnostic information is  necessary to determine patient infection status.  Positive results do  not rule out bacterial infection or co-infection with other viruses. If result is PRESUMPTIVE POSTIVE SARS-CoV-2 nucleic acids MAY BE PRESENT.   A presumptive positive result was obtained on the submitted specimen  and confirmed on repeat testing.  While 2019 novel coronavirus  (SARS-CoV-2) nucleic acids may be present in the submitted sample  additional confirmatory testing may be necessary for epidemiological  and / or clinical management purposes  to differentiate between  SARS-CoV-2 and other Sarbecovirus currently known to infect humans.  If clinically indicated additional testing with an alternate test  methodology [phone]) is advised. The SARS-CoV-2 RNA is generally  detectable in upper and lower respiratory sp ecimens during the acute  phase of infection. The expected result is Negative. Fact Sheet for Patients:  StrictlyIdeas.no Fact Sheet for Healthcare Providers: BankingDealers.co.za This test is not  yet approved or cleared by the [place] FDA and has been authorized for detection and/or diagnosis of SARS-CoV-2 by FDA under an Emergency Use Authorization (EUA).  This EUA will remain in effect (meaning this test can be used) for the duration of the COVID-19 declaration under Section 564(b)(1) of the Act, 21 U.S.C. section 360bbb-3(b)(1), unless the authorization is terminated or revoked sooner. Performed at [facility], [address] [place], [place] [address]   MRSA PCR Screening     Status: None   Collection Time: 09/07/19  4:26 AM   Specimen: Nasopharyngeal  Result Value Ref Range Status   MRSA by PCR NEGATIVE NEGATIVE Final    Comment:        The GeneXpert MRSA Assay (FDA approved for NASAL specimens only), is one component of a comprehensive MRSA colonization surveillance program. It is not intended to diagnose MRSA infection nor to guide or monitor treatment for MRSA infections. Performed at [facility], [address] [place], [place] [address]     Procedures and diagnostic studies:  No results found.  Medications:   . aspirin EC  81 mg Oral Daily  . Chlorhexidine Gluconate Cloth  6 each Topical Q0600  . enoxaparin (LOVENOX) injection  40 mg Subcutaneous Q24H  . folic acid  1 mg Oral Daily  . insulin aspart  0-5 Units Subcutaneous QHS  . insulin aspart  0-9 Units Subcutaneous TID WC  . insulin aspart  3 Units Subcutaneous TID WC  . insulin glargine  20 Units Subcutaneous QHS  . multivitamin with minerals  1 tablet Oral Daily  . nicotine  14 mg Transdermal Daily  . pantoprazole  40 mg Oral Daily  . sodium chloride flush  3 mL Intravenous Q12H  . thiamine  100 mg Oral Daily   Continuous Infusions: . sodium chloride 250 mL (09/08/19 2311)  . ampicillin-sulbactam (UNASYN) IV 3 g (09/09/19 0947)     LOS: 5 days   Kamori Barbier  [facility] Pager [phone].   *Please refer to [email or web address], password [email or web address] to get  updated schedule on who will round on this patient, as hospitalists switch teams weekly. If 7PM-7AM, please contact night-coverage at [email or web address], password [email or web address] for any overnight needs.  09/09/2019, 12:34 PM

## 2019-09-10 LAB — BASIC METABOLIC PANEL
Anion gap: 9 (ref 5–15)
BUN: 18 mg/dL (ref 6–20)
CO2: 22 mmol/L (ref 22–32)
Calcium: 9 mg/dL (ref 8.9–10.3)
Chloride: 109 mmol/L (ref 98–111)
Creatinine, Ser: 1.08 mg/dL (ref 0.61–1.24)
GFR calc Af Amer: >60 mL/min (ref >60)
GFR calc non Af Amer: >60 mL/min (ref >60)
Glucose, Bld: 215 mg/dL — ABNORMAL HIGH (ref 70–99)
Potassium: 3.7 mmol/L (ref 3.5–5.1)
Sodium: 140 mmol/L (ref 135–145)

## 2019-09-10 LAB — GLUCOSE, CAPILLARY
Glucose-Capillary: 159 mg/dL — ABNORMAL HIGH (ref 70–99)
Glucose-Capillary: 286 mg/dL — ABNORMAL HIGH (ref 70–99)

## 2019-09-10 MED ORDER — NICOTINE 14 MG/24HR TD PT24: 14.0000 mg | MEDICATED_PATCH | Freq: Every day | TRANSDERMAL | 0 refills | Status: DC

## 2019-09-10 MED ORDER — LISINOPRIL 5 MG PO TABS: 5.0000 mg | ORAL_TABLET | Freq: Every day | ORAL | 3 refills | Status: DC

## 2019-09-10 MED ORDER — INSULIN GLARGINE 100 UNITS/ML SOLOSTAR PEN: 22.0000 [IU] | PEN_INJECTOR | Freq: Every day | SUBCUTANEOUS | 11 refills | Status: DC

## 2019-09-10 MED ORDER — NOVOLOG FLEXPEN 100 UNIT/ML ~~LOC~~ SOPN: 0.0000 [IU] | PEN_INJECTOR | Freq: Three times a day (TID) | SUBCUTANEOUS | 11 refills | Status: DC
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
## 2019-09-10 NOTE — TOC Transition Note (Signed)
Transition of Care [facility]) - CM/SW Discharge Note   Patient Details  Name: Spencer [name] MRN: [id number] Date of Birth: 12-Jul-1961  [facility] CM/SW Contact:  Shelbie Hutching, RN Phone Number: 09/10/2019, 12:17 PM   Clinical Narrative:    Patient to discharge back to his boarding house today.  Patient reports that his friend Aaron Edelman will pick him up at discharge.  Patient has been scheduled for hospital follow up with [place] for Nov 20th at 3pm- patient aware.  Patient reports that he does not get along with his current PCP- RNCM instructed patient to call Medicaid and have them change his PCP, until then keep the scheduled appointment.   Patient has numbers for [employer] and [employer]- he wants to find a new place to live.     Final next level of care: Home/Self Care Barriers to Discharge: Barriers Resolved   Patient Goals and CMS Choice   CMS Medicare.gov Compare Post Acute Care list provided to:: Patient Represenative (must comment)(Sister) Choice offered to / list presented to : NA  Discharge Placement                       Discharge Plan and Services     Post Acute Care Choice: NA                               Social Determinants of Health (SDOH) Interventions     Readmission Risk Interventions Readmission Risk Prevention Plan 09/05/2019 08/21/2019 02/19/2019  Transportation Screening - Complete Complete  PCP or Specialist Appt within 3-5 Days - - Complete  HRI or Home Care Consult - - Not Complete  HRI or Home Care Consult comments - - Patient does not meet homebound status  Palliative Care Screening - - Not Applicable  Medication Review (RN Care Manager) - Complete Complete  PCP or Specialist appointment within 3-5 days of discharge Complete - -  HRI or [place] - - -  SW Recovery Care/Counseling Consult - Complete -  SW Consult Not Complete Comments - - -  Palliative Care  Screening Not Applicable Not Applicable -  [place] Not Applicable Not Applicable -  Some recent data might be hidden

## 2019-09-11 LAB — BLOOD GAS, VENOUS
Acid-base deficit: 27.5 mmol/L — ABNORMAL HIGH (ref 0.0–2.0)
Bicarbonate: 3.9 mmol/L — ABNORMAL LOW (ref 20.0–28.0)
O2 Saturation: 36.9 %
Patient temperature: 37
pCO2, Ven: 19 mmHg — CL (ref 44.0–60.0)
pH, Ven: 6.91 — CL (ref 7.250–7.430)

## 2019-09-12 ENCOUNTER — Other Ambulatory Visit: Payer: Self-pay

## 2019-09-12 ENCOUNTER — Encounter: Payer: Self-pay | Admitting: Emergency Medicine

## 2019-09-12 ENCOUNTER — Inpatient Hospital Stay: Payer: Medicaid Other

## 2019-09-12 ENCOUNTER — Inpatient Hospital Stay
Admission: EM | Admit: 2019-09-12 | Discharge: 2019-09-15 | DRG: 637 | Disposition: A | Discharge status: Home / Self Care | Payer: Medicaid Other | Attending: Family Medicine | Admitting: Family Medicine

## 2019-09-12 DIAGNOSIS — I482 Chronic atrial fibrillation, unspecified: Secondary | ICD-10-CM | POA: Diagnosis not present

## 2019-09-12 DIAGNOSIS — Z20828 Contact with and (suspected) exposure to other viral communicable diseases: Secondary | ICD-10-CM | POA: Diagnosis present

## 2019-09-12 DIAGNOSIS — I10 Essential (primary) hypertension: Secondary | ICD-10-CM | POA: Diagnosis present

## 2019-09-12 DIAGNOSIS — E1110 Type 2 diabetes mellitus with ketoacidosis without coma: Principal | ICD-10-CM | POA: Diagnosis present

## 2019-09-12 DIAGNOSIS — N179 Acute kidney failure, unspecified: Secondary | ICD-10-CM | POA: Diagnosis present

## 2019-09-12 DIAGNOSIS — R739 Hyperglycemia, unspecified: Secondary | ICD-10-CM | POA: Diagnosis not present

## 2019-09-12 DIAGNOSIS — F17200 Nicotine dependence, unspecified, uncomplicated: Secondary | ICD-10-CM | POA: Diagnosis present

## 2019-09-12 DIAGNOSIS — Z681 Body mass index (BMI) 19 or less, adult: Secondary | ICD-10-CM | POA: Diagnosis not present

## 2019-09-12 DIAGNOSIS — Z7982 Long term (current) use of aspirin: Secondary | ICD-10-CM

## 2019-09-12 DIAGNOSIS — E875 Hyperkalemia: Secondary | ICD-10-CM | POA: Diagnosis present

## 2019-09-12 DIAGNOSIS — E1310 Other specified diabetes mellitus with ketoacidosis without coma: Secondary | ICD-10-CM | POA: Diagnosis not present

## 2019-09-12 DIAGNOSIS — Z79899 Other long term (current) drug therapy: Secondary | ICD-10-CM

## 2019-09-12 DIAGNOSIS — Z794 Long term (current) use of insulin: Secondary | ICD-10-CM

## 2019-09-12 DIAGNOSIS — D72829 Elevated white blood cell count, unspecified: Secondary | ICD-10-CM | POA: Diagnosis present

## 2019-09-12 DIAGNOSIS — Z8249 Family history of ischemic heart disease and other diseases of the circulatory system: Secondary | ICD-10-CM | POA: Diagnosis not present

## 2019-09-12 DIAGNOSIS — F1010 Alcohol abuse, uncomplicated: Secondary | ICD-10-CM | POA: Diagnosis present

## 2019-09-12 DIAGNOSIS — K219 Gastro-esophageal reflux disease without esophagitis: Secondary | ICD-10-CM | POA: Diagnosis present

## 2019-09-12 DIAGNOSIS — I480 Paroxysmal atrial fibrillation: Secondary | ICD-10-CM | POA: Diagnosis present

## 2019-09-12 DIAGNOSIS — I671 Cerebral aneurysm, nonruptured: Secondary | ICD-10-CM | POA: Diagnosis present

## 2019-09-12 DIAGNOSIS — F1410 Cocaine abuse, uncomplicated: Secondary | ICD-10-CM | POA: Diagnosis present

## 2019-09-12 DIAGNOSIS — E0810 Diabetes mellitus due to underlying condition with ketoacidosis without coma: Secondary | ICD-10-CM

## 2019-09-12 DIAGNOSIS — T68XXXA Hypothermia, initial encounter: Secondary | ICD-10-CM

## 2019-09-12 DIAGNOSIS — E43 Unspecified severe protein-calorie malnutrition: Secondary | ICD-10-CM | POA: Diagnosis present

## 2019-09-12 LAB — BLOOD GAS, VENOUS
Acid-base deficit: 30.2 mmol/L — ABNORMAL HIGH (ref 0.0–2.0)
Bicarbonate: 3 mmol/L — ABNORMAL LOW (ref 20.0–28.0)
O2 Saturation: 60.6 %
Patient temperature: 37
pCO2, Ven: <19 mmHg — CL (ref 44.0–60.0)
pH, Ven: <6.9 — CL (ref 7.250–7.430)
pO2, Ven: 60 mmHg — ABNORMAL HIGH (ref 32.0–45.0)

## 2019-09-12 LAB — CBC WITH DIFFERENTIAL/PLATELET
Abs Immature Granulocytes: 0.7 10*3/uL — ABNORMAL HIGH (ref 0.00–0.07)
Basophils Absolute: 0 10*3/uL (ref 0.0–0.1)
Basophils Relative: 0 %
Eosinophils Absolute: 0 10*3/uL (ref 0.0–0.5)
Eosinophils Relative: 0 %
HCT: 42.9 % (ref 39.0–52.0)
Hemoglobin: 12.7 g/dL — ABNORMAL LOW (ref 13.0–17.0)
Lymphocytes Relative: 13 %
Lymphs Abs: 2.1 10*3/uL (ref 0.7–4.0)
MCH: 31.4 pg (ref 26.0–34.0)
MCHC: 29.6 g/dL — ABNORMAL LOW (ref 30.0–36.0)
MCV: 106.2 fL — ABNORMAL HIGH (ref 80.0–100.0)
Metamyelocytes Relative: 3 %
Monocytes Absolute: 2.3 10*3/uL — ABNORMAL HIGH (ref 0.1–1.0)
Monocytes Relative: 14 %
Myelocytes: 1 %
Neutro Abs: 11.2 10*3/uL — ABNORMAL HIGH (ref 1.7–7.7)
Neutrophils Relative %: 69 %
Platelets: 517 10*3/uL — ABNORMAL HIGH (ref 150–400)
RBC: 4.04 MIL/uL — ABNORMAL LOW (ref 4.22–5.81)
RDW: 12.9 % (ref 11.5–15.5)
Smear Review: NORMAL
WBC: 16.3 10*3/uL — ABNORMAL HIGH (ref 4.0–10.5)
nRBC: 0 % (ref 0.0–0.2)

## 2019-09-12 LAB — BASIC METABOLIC PANEL
BUN: 51 mg/dL — ABNORMAL HIGH (ref 6–20)
CO2: <7 mmol/L — ABNORMAL LOW (ref 22–32)
Calcium: 9.1 mg/dL (ref 8.9–10.3)
Chloride: 104 mmol/L (ref 98–111)
Creatinine, Ser: 2.87 mg/dL — ABNORMAL HIGH (ref 0.61–1.24)
GFR calc Af Amer: 26 mL/min — ABNORMAL LOW (ref >60)
GFR calc non Af Amer: 23 mL/min — ABNORMAL LOW (ref >60)
Glucose, Bld: 830 mg/dL (ref 70–99)
Potassium: 6.9 mmol/L (ref 3.5–5.1)
Sodium: 139 mmol/L (ref 135–145)

## 2019-09-12 LAB — COMPREHENSIVE METABOLIC PANEL
ALT: 27 U/L (ref 0–44)
AST: 50 U/L — ABNORMAL HIGH (ref 15–41)
Albumin: 3.5 g/dL (ref 3.5–5.0)
Alkaline Phosphatase: 111 U/L (ref 38–126)
BUN: 47 mg/dL — ABNORMAL HIGH (ref 6–20)
CO2: <7 mmol/L — ABNORMAL LOW (ref 22–32)
Calcium: 8.6 mg/dL — ABNORMAL LOW (ref 8.9–10.3)
Chloride: 107 mmol/L (ref 98–111)
Creatinine, Ser: 2.58 mg/dL — ABNORMAL HIGH (ref 0.61–1.24)
GFR calc Af Amer: 30 mL/min — ABNORMAL LOW (ref >60)
GFR calc non Af Amer: 26 mL/min — ABNORMAL LOW (ref >60)
Glucose, Bld: 765 mg/dL (ref 70–99)
Potassium: 7.1 mmol/L (ref 3.5–5.1)
Sodium: 139 mmol/L (ref 135–145)
Total Bilirubin: 2 mg/dL — ABNORMAL HIGH (ref 0.3–1.2)
Total Protein: 6.4 g/dL — ABNORMAL LOW (ref 6.5–8.1)

## 2019-09-12 LAB — CBC
HCT: 45.9 % (ref 39.0–52.0)
Hemoglobin: 12.9 g/dL — ABNORMAL LOW (ref 13.0–17.0)
MCH: 31.2 pg (ref 26.0–34.0)
MCHC: 28.1 g/dL — ABNORMAL LOW (ref 30.0–36.0)
MCV: 110.9 fL — ABNORMAL HIGH (ref 80.0–100.0)
Platelets: 531 10*3/uL — ABNORMAL HIGH (ref 150–400)
RBC: 4.14 MIL/uL — ABNORMAL LOW (ref 4.22–5.81)
RDW: 12.8 % (ref 11.5–15.5)
WBC: 15.1 10*3/uL — ABNORMAL HIGH (ref 4.0–10.5)
nRBC: 0 % (ref 0.0–0.2)

## 2019-09-12 LAB — GLUCOSE, CAPILLARY
Glucose-Capillary: >600 mg/dL (ref 70–99)
Glucose-Capillary: >600 mg/dL (ref 70–99)

## 2019-09-12 MED ORDER — INSULIN REGULAR(HUMAN) IN NACL 100-0.9 UT/100ML-% IV SOLN
INTRAVENOUS | Status: DC
  Administered 2019-09-12: 5.4 [IU]/h via INTRAVENOUS
  Filled 2019-09-12 (×2): qty 100

## 2019-09-12 MED ORDER — SODIUM BICARBONATE 8.4 % IV SOLN
50.0000 meq | Freq: Once | INTRAVENOUS | Status: AC
  Administered 2019-09-13: 50 meq via INTRAVENOUS
  Filled 2019-09-12: qty 50

## 2019-09-12 MED ORDER — HEPARIN SODIUM (PORCINE) 5000 UNIT/ML IJ SOLN
5000.0000 [IU] | Freq: Three times a day (TID) | INTRAMUSCULAR | Status: DC
  Administered 2019-09-13 – 2019-09-15 (×5): 5000 [IU] via SUBCUTANEOUS
  Filled 2019-09-12 (×6): qty 1

## 2019-09-12 MED ORDER — ACETAMINOPHEN 325 MG PO TABS: 650.0000 mg | ORAL_TABLET | ORAL | Status: DC | PRN

## 2019-09-12 MED ORDER — SODIUM CHLORIDE 0.9 % IV SOLN: INTRAVENOUS | Status: AC

## 2019-09-12 MED ORDER — INSULIN REGULAR(HUMAN) IN NACL 100-0.9 UT/100ML-% IV SOLN: INTRAVENOUS | Status: DC

## 2019-09-12 MED ORDER — DEXTROSE-NACL 5-0.45 % IV SOLN
INTRAVENOUS | Status: DC
  Administered 2019-09-12 – 2019-09-13 (×2): via INTRAVENOUS

## 2019-09-12 MED ORDER — THIAMINE HCL 100 MG/ML IJ SOLN
Freq: Every day | INTRAVENOUS | Status: DC
  Administered 2019-09-13 – 2019-09-14 (×2): via INTRAVENOUS
  Filled 2019-09-12 (×5): qty 1000

## 2019-09-12 MED ORDER — PANTOPRAZOLE SODIUM 40 MG PO TBEC
40.0000 mg | DELAYED_RELEASE_TABLET | Freq: Every day | ORAL | Status: DC
  Administered 2019-09-13 – 2019-09-15 (×3): 40 mg via ORAL
  Filled 2019-09-12 (×3): qty 1

## 2019-09-12 MED ORDER — SODIUM BICARBONATE 8.4 % IV SOLN
50.0000 meq | Freq: Once | INTRAVENOUS | Status: AC
  Administered 2019-09-12: 50 meq via INTRAVENOUS
  Filled 2019-09-12: qty 50

## 2019-09-12 MED ORDER — ALBUTEROL SULFATE (2.5 MG/3ML) 0.083% IN NEBU
2.5000 mg | INHALATION_SOLUTION | RESPIRATORY_TRACT | Status: AC
  Filled 2019-09-12 (×2): qty 3

## 2019-09-12 MED ORDER — SODIUM ZIRCONIUM CYCLOSILICATE 10 G PO PACK
10.0000 g | PACK | Freq: Once | ORAL | Status: DC
  Filled 2019-09-12: qty 1

## 2019-09-12 MED ORDER — TRAZODONE HCL 50 MG PO TABS: 25.0000 mg | ORAL_TABLET | Freq: Every evening | ORAL | Status: DC | PRN

## 2019-09-12 MED ORDER — DEXTROSE-NACL 5-0.45 % IV SOLN: INTRAVENOUS | Status: DC

## 2019-09-12 MED ORDER — ADULT MULTIVITAMIN W/MINERALS CH
1.0000 | ORAL_TABLET | Freq: Every day | ORAL | Status: DC
  Administered 2019-09-13 – 2019-09-15 (×3): 1 via ORAL
  Filled 2019-09-12 (×3): qty 1

## 2019-09-12 MED ORDER — SODIUM CHLORIDE 0.9 % IV BOLUS
1000.0000 mL | Freq: Once | INTRAVENOUS | Status: AC
  Administered 2019-09-12: 1000 mL via INTRAVENOUS

## 2019-09-12 MED ORDER — LABETALOL HCL 5 MG/ML IV SOLN: 20.0000 mg | INTRAVENOUS | Status: DC | PRN

## 2019-09-12 MED ORDER — NICOTINE 14 MG/24HR TD PT24
14.0000 mg | MEDICATED_PATCH | Freq: Every day | TRANSDERMAL | Status: DC
  Administered 2019-09-13: 14 mg via TRANSDERMAL
  Filled 2019-09-12 (×3): qty 1

## 2019-09-12 MED ORDER — ALUM & MAG HYDROXIDE-SIMETH 200-200-20 MG/5ML PO SUSP: 30.0000 mL | ORAL | Status: DC | PRN

## 2019-09-12 MED ORDER — ONDANSETRON HCL 4 MG/2ML IJ SOLN: 4.0000 mg | INTRAMUSCULAR | Status: DC | PRN

## 2019-09-12 MED ORDER — ASPIRIN EC 81 MG PO TBEC
81.0000 mg | DELAYED_RELEASE_TABLET | Freq: Every day | ORAL | Status: DC
  Administered 2019-09-13 – 2019-09-15 (×3): 81 mg via ORAL
  Filled 2019-09-12 (×2): qty 1

## 2019-09-12 MED ORDER — CALCIUM GLUCONATE-NACL 1-0.675 GM/50ML-% IV SOLN
1.0000 g | Freq: Once | INTRAVENOUS | Status: AC
  Administered 2019-09-12: 1000 mg via INTRAVENOUS
  Filled 2019-09-12: qty 50

## 2019-09-12 MED ORDER — SODIUM CHLORIDE 0.9 % IV SOLN
INTRAVENOUS | Status: DC
  Administered 2019-09-12: 23:00:00 via INTRAVENOUS

## 2019-09-12 MED ORDER — INSULIN REGULAR BOLUS VIA INFUSION
10.0000 [IU] | Freq: Once | INTRAVENOUS | Status: AC
  Administered 2019-09-12: 10 [IU] via INTRAVENOUS

## 2019-09-12 MED ORDER — SODIUM CHLORIDE 0.9 % IV SOLN: 1.0000 g | Freq: Once | INTRAVENOUS | Status: DC

## 2019-09-12 NOTE — ED Provider Notes (Signed)
[facility] Emergency Department Provider Note   ____________________________________________   I have reviewed the triage vital signs and the nursing notes.   HISTORY  Chief Complaint Hyperglycemia   History limited by: Not Limited   HPI Spencer [name] is a 58 y.o. male who presents to the emergency department today because of concern for elevated blood sugar. He states he was discharged from the hospital two days ago for the same thing. Says he has been using his insulin as instructed since discharge. Denies any fevers.   Records reviewed. Per medical record review patient has a history of DM, DKA, recent discharge 2 days ago for DKA.   Past Medical History:  Diagnosis Date  . Brain aneurysm   . Broken bones    clavical, ankle, arms toes wriast   . Diabetes mellitus without complication (HCC)   . Dysrhythmia   . GERD (gastroesophageal reflux disease)   . Hypertension   . Substance abuse [facility])     Patient Active Problem List   Diagnosis Date Noted  . Tobacco abuse counseling   . Atrial fibrillation with RVR (HCC) 08/07/2019  . GERD (gastroesophageal reflux disease) 08/07/2019  . Acute renal failure (HCC)   . Hyperkalemia   . Sepsis (HCC) 06/09/2019  . AKI (acute kidney injury) (HCC) 02/19/2019  . Hypoglycemia 10/23/2018  . Chronic low back pain 01/16/2017  . Chronic neck pain 01/16/2017  . Diabetic neuropathy (HCC) 01/16/2017  . DM2 (diabetes mellitus, type 2) (HCC) 01/16/2017  . History of cocaine abuse (HCC) 01/16/2017  . Personal history of subdural hematoma 01/16/2017  . Closed displaced fracture of body of left calcaneus with delayed healing 12/13/2016  . Protein-calorie malnutrition, severe 11/08/2016  . DKA, type 2 (HCC) 11/06/2016  . DKA (diabetic ketoacidoses) (HCC) 04/03/2015  . Hypertension 04/03/2015  . Depression 04/03/2015  . Opiate dependence (HCC) 04/03/2015  . Tobacco abuse 04/03/2015  . Lumbosacral neuritis 07/19/2014   . Pain of finger of right hand 07/19/2014  . Type II or unspecified type diabetes mellitus without mention of complication, not stated as uncontrolled 07/19/2014  . Knee pain 09/12/2013  . Hernia of flank 09/20/2012  . Epidermoid cyst of skin 09/20/2012  . Chronic pain of both shoulders 03/27/2012    Past Surgical History:  Procedure Laterality Date  . CORONARY/GRAFT ACUTE MI REVASCULARIZATION N/A 09/04/2019   Procedure: Coronary/Graft Acute MI Revascularization;  Surgeon: Alwyn Pea, MD;  Location: [facility] INVASIVE CV LAB;  Service: Cardiovascular;  Laterality: N/A;  . ESOPHAGOGASTRODUODENOSCOPY (EGD) WITH PROPOFOL N/A 06/04/2019   Procedure: ESOPHAGOGASTRODUODENOSCOPY (EGD) WITH PROPOFOL;  Surgeon: Toledo, Boykin Nearing, MD;  Location: [facility] ENDOSCOPY;  Service: Gastroenterology;  Laterality: N/A;  . HERNIA REPAIR    . IMPLANTATION / PLACEMENT OF STRIP ELECTRODES VIA BURR HOLES SUBDURAL     anyrusum  . LEFT HEART CATH AND CORONARY ANGIOGRAPHY N/A 09/04/2019   Procedure: LEFT HEART CATH AND CORONARY ANGIOGRAPHY;  Surgeon: Alwyn Pea, MD;  Location: [facility] INVASIVE CV LAB;  Service: Cardiovascular;  Laterality: N/A;  . NECK SURGERY    . TEE WITHOUT CARDIOVERSION N/A 06/03/2019   Procedure: TRANSESOPHAGEAL ECHOCARDIOGRAM (TEE);  Surgeon: Dalia Heading, MD;  Location: [facility] ORS;  Service: Cardiovascular;  Laterality: N/A;    Prior to Admission medications   Medication Sig Start Date End Date Taking? Authorizing Provider  aspirin EC 81 MG tablet Take 1 tablet (81 mg total) by mouth daily. 08/21/19 03/08/20  Altamese Dilling, MD  insulin glargine (LANTUS) 100 unit/mL  SOPN Inject 0.22 mLs (22 Units total) into the skin daily. 09/10/19   Danford, Suann Larry, MD  lisinopril (ZESTRIL) 5 MG tablet Take 1 tablet (5 mg total) by mouth daily. 09/11/19   Danford, Suann Larry, MD  metFORMIN (GLUCOPHAGE) 1000 MG tablet Take 1,000 mg by mouth 2 (two) times daily with a meal.    [provider]  Multiple Vitamin (MULTIVITAMIN WITH MINERALS) TABS tablet Take 1 tablet by mouth daily. 07/30/19   Fritzi Mandes, MD  nicotine (NICODERM CQ - DOSED IN MG/24 HOURS) 14 mg/24hr patch Place 1 patch (14 mg total) onto the skin daily. 09/11/19   Danford, Suann Larry, MD  NOVOLOG FLEXPEN 100 UNIT/ML FlexPen Inject 0-30 Units into the skin 3 (three) times daily. 09/10/19   Danford, Suann Larry, MD  pantoprazole (PROTONIX) 40 MG tablet Take 1 tablet (40 mg total) by mouth daily. 06/05/19 09/05/19  Saundra Shelling, MD    Allergies Patient has no known allergies.  Family History  Problem Relation Age of Onset  . CAD Sister   . Hypertension Sister   . Healthy Mother   . Healthy Father     Social History Social History   Tobacco Use  . Smoking status: Current Some Day Smoker  . Smokeless tobacco: Never Used  Substance Use Topics  . Alcohol use: Yes    Alcohol/week: 1.0 standard drinks    Types: 1 Cans of beer per week  . Drug use: Yes    Types: Cocaine    Review of Systems Constitutional: No fever/chills Eyes: No visual changes. ENT: No sore throat. Cardiovascular: Denies chest pain. Respiratory: Denies shortness of breath. Gastrointestinal: No abdominal pain.  No nausea, no vomiting.  No diarrhea.   Genitourinary: Negative for dysuria. Musculoskeletal: Negative for back pain. Skin: Negative for rash. Neurological: Negative for headaches, focal weakness or numbness.  ____________________________________________   PHYSICAL EXAM:  VITAL SIGNS: ED Triage Vitals  Enc Vitals Group     BP 09/12/19 1847 (!) 141/77     Pulse Rate 09/12/19 1847 99     Resp 09/12/19 1847 (!) 28     Temp 09/12/19 1902 (!) 95.1 F (35.1 C)     Temp Source 09/12/19 1902 Oral     SpO2 09/12/19 1847 100 %     Weight 09/12/19 1842 136 lb 0.4 oz (61.7 kg)     Height 09/12/19 1842 5\' 10"  (1.778 m)     Head Circumference --      Peak Flow --      Pain Score 09/12/19 1842 0    Constitutional: Alert and oriented.  Eyes: Conjunctivae are normal.  ENT      Head: Normocephalic and atraumatic.      Nose: No congestion/rhinnorhea.      Mouth/Throat: Mucous membranes are moist.      Neck: No stridor. Hematological/Lymphatic/Immunilogical: No cervical lymphadenopathy. Cardiovascular: Normal rate, regular rhythm.  No murmurs, rubs, or gallops.  Respiratory: Tachypnea. Breath sounds are clear and equal bilaterally. No wheezes/rales/rhonchi. Gastrointestinal: Soft and non tender. No rebound. No guarding.  Genitourinary: Deferred Musculoskeletal: Normal range of motion in all extremities. No lower extremity edema. Neurologic:  Normal speech and language. No gross focal neurologic deficits are appreciated.  Skin:  Skin is warm, dry and intact. No rash noted. Psychiatric: Mood and affect are normal. Speech and behavior are normal. Patient exhibits appropriate insight and judgment.  ____________________________________________    LABS (pertinent positives/negatives)  CBC wbc 16.3, hgb 12.7, plt 517 CMP na  139, k 7.1, glu 765, cr 2.58 VBG pH <6.9  ____________________________________________   EKG  I, Phineas SemenGraydon Johnanthony Wilden, attending physician, personally viewed and interpreted this EKG  EKG Time: 2108 Rate: 117 Rhythm: atrial tachycardia Axis: normal Intervals: qtc 474 QRS: narrow ST changes: peaked t waves V3, v4 Impression: abnormal ekg   ____________________________________________    RADIOLOGY  None  ____________________________________________   PROCEDURES  Procedures  CRITICAL CARE Performed by: Phineas SemenGraydon Tynslee Bowlds   Total critical care time: 40 minutes  Critical care time was exclusive of separately billable procedures and treating other patients.  Critical care was necessary to treat or prevent imminent or life-threatening deterioration.  Critical care was time spent personally by me on the following activities: development of  treatment plan with patient and/or surrogate as well as nursing, discussions with consultants, evaluation of patient's response to treatment, examination of patient, obtaining history from patient or surrogate, ordering and performing treatments and interventions, ordering and review of laboratory studies, ordering and review of radiographic studies, pulse oximetry and re-evaluation of patient's condition.  ____________________________________________   INITIAL IMPRESSION / ASSESSMENT AND PLAN / ED COURSE  Pertinent labs & imaging results that were available during my care of the patient were reviewed by me and considered in my medical decision making (see chart for details).   Patient presents to the emergency department today with concerns for elevated blood sugar.  The patient was discharged from the hospital 2 days ago after an admission for DKA.  Sugars for EMS were read as high.  Did have concern the patient was likely in DKA again.  VBG was performed pH was less than 6.9.  Chemistry does show elevated blood sugar as well as potassium.  Does appear that this is typical when the patient gets DKA to have his potassium elevate.  Patient was started on insulin drip.  Will plan on admission to the hospital service.  ____________________________________________   FINAL CLINICAL IMPRESSION(S) / ED DIAGNOSES  Final diagnoses:  Diabetic ketoacidosis without coma associated with diabetes mellitus due to underlying condition [facility](HCC)     Note: This dictation was prepared with Dragon dictation. Any transcriptional errors that result from this process are unintentional     Phineas SemenGoodman, Yunus Stokl[place], MD 09/12/19 2139

## 2019-09-12 NOTE — ED Notes (Signed)
MD Archie Balboa aware of pt's critical K+ and glucose.

## 2019-09-12 NOTE — ED Triage Notes (Addendum)
Arrived for hyperglycemia. Discharged two days ago for same. Kussmaul breathing noted.  Dry membranes.  Hx DKA. UTO oral temp, will get rectal.

## 2019-09-12 NOTE — ED Notes (Signed)
Warm blankets placed on pt at this time. Per MD Archie Balboa, CBG not necessary at this time and this RN is waiting on result from lab work for blood glucose.

## 2019-09-12 NOTE — H&P (Addendum)
[facility] [place] NAME: Spencer [name]    MR#:  [id number]  DATE OF BIRTH:  1961/08/22  DATE OF ADMISSION:  09/12/2019  PRIMARY CARE PHYSICIAN: Jodi Marble, MD   REQUESTING/REFERRING PHYSICIAN: Nance Pear, MD  CHIEF COMPLAINT:   Chief Complaint  Patient presents with  . Hyperglycemia    HISTORY OF PRESENT ILLNESS:  Spencer [name]  is a 58 y.o. male with a known history of type II obese mellitus, recent admission for DKA, hypertension, GERD, cocaine and alcohol abuse, as well as noncommitment to medical therapy, who presented to the emergency room with acute onset of hyperglycemia with associated polyuria and polydipsia.  He admitted to nausea and vomiting without diarrhea or abdominal pain.  No bilious vomitus or hematemesis.  He denied any fever or chills, cough or wheezing or dyspnea.  No chest pain or palpitations.  He was mildly lethargic but alert and responding to questions. During his last admission patient an abnormal EKG was taken to the Cath Lab and had a negative cardiac catheterization.  It showed normal EF of 60%.  He was treated for pneumonia with IV Unasyn as well as severe hyperkalemia and acute kidney injury.  Upon presentation to the emergency room, blood pressure was 141/77 with respiratory to 28 and pulse ox 95% on room air and temperature 95.1 with heart rate of 99.  Labs revealed venous blood gas with a pH less than 6.9 and PCO2 less than 19 with bicarbonate of 3.  CMP showed potassium of 7.1 and blood glucose of 765 with a BUN of 47 and creatinine 2.58 calcium of 8.6 AST 50 and total bilirubin of 2.  CBC with differential revealed leukocytosis 16.3.  COVID-19 test came back negative.  The patient was given an ampule of calcium gluconate and amp sodium bicarb 3 L bolus of IV normal saline 10 g of p.o. Lokelma and will start on IV regular insulin drip.  He will be admitted to a stepdown unit for further evaluation and management. PAST  MEDICAL HISTORY:   Past Medical History:  Diagnosis Date  . Brain aneurysm   . Broken bones    clavical, ankle, arms toes wriast   . Diabetes mellitus without complication ([place])   . Dysrhythmia   . GERD (gastroesophageal reflux disease)   . Hypertension   . Substance abuse ([place])   Cocaine abuse and alcohol abuse Non- commitment to medical therapy Atrial fibrillation  PAST SURGICAL HISTORY:   Past Surgical History:  Procedure Laterality Date  . CORONARY/GRAFT ACUTE MI REVASCULARIZATION N/A 09/04/2019   Procedure: Coronary/Graft Acute MI Revascularization;  Surgeon: Yolonda Kida, MD;  Location: [place] CV LAB;  Service: Cardiovascular;  Laterality: N/A;  . ESOPHAGOGASTRODUODENOSCOPY (EGD) WITH PROPOFOL N/A 06/04/2019   Procedure: ESOPHAGOGASTRODUODENOSCOPY (EGD) WITH PROPOFOL;  Surgeon: Toledo, Benay Pike, MD;  Location: [facility] ENDOSCOPY;  Service: Gastroenterology;  Laterality: N/A;  . HERNIA REPAIR    . IMPLANTATION / PLACEMENT OF STRIP ELECTRODES VIA BURR HOLES SUBDURAL     anyrusum  . LEFT HEART CATH AND CORONARY ANGIOGRAPHY N/A 09/04/2019   Procedure: LEFT HEART CATH AND CORONARY ANGIOGRAPHY;  Surgeon: Yolonda Kida, MD;  Location: [place] CV LAB;  Service: Cardiovascular;  Laterality: N/A;  . NECK SURGERY    . TEE WITHOUT CARDIOVERSION N/A 06/03/2019   Procedure: TRANSESOPHAGEAL ECHOCARDIOGRAM (TEE);  Surgeon: Teodoro Spray, MD;  Location: [facility] ORS;  Service: Cardiovascular;  Laterality: N/A;    SOCIAL HISTORY:  Social History   Tobacco Use  . Smoking status: Current Some Day Smoker  . Smokeless tobacco: Never Used  Substance Use Topics  . Alcohol use: Yes    Alcohol/week: 1.0 standard drinks    Types: 1 Cans of beer per week    FAMILY HISTORY:   Family History  Problem Relation Age of Onset  . CAD Sister   . Hypertension Sister   . Healthy Mother   . Healthy Father     DRUG ALLERGIES:  No Known Allergies  REVIEW OF SYSTEMS:   ROS  As per history of present illness. All pertinent systems were reviewed above. Constitutional,  HEENT, cardiovascular, respiratory, GI, GU, musculoskeletal, neuro, psychiatric, endocrine,  integumentary and hematologic systems were reviewed and are otherwise  negative/unremarkable except for positive findings mentioned above in the HPI.   MEDICATIONS AT HOME:   Prior to Admission medications   Medication Sig Start Date End Date Taking? Authorizing Provider  pantoprazole (PROTONIX) 40 MG tablet Take 1 tablet (40 mg total) by mouth daily. 06/05/19 09/12/19 Yes Pyreddy, Vivien Rota, MD  aspirin EC 81 MG tablet Take 1 tablet (81 mg total) by mouth daily. 08/21/19 03/08/20  Altamese Dilling, MD  insulin glargine (LANTUS) 100 unit/mL SOPN Inject 0.22 mLs (22 Units total) into the skin daily. 09/10/19   Danford, Earl Lites, MD  lisinopril (ZESTRIL) 5 MG tablet Take 1 tablet (5 mg total) by mouth daily. 09/11/19   Danford, Earl Lites, MD  metFORMIN (GLUCOPHAGE) 1000 MG tablet Take 1,000 mg by mouth 2 (two) times daily with a meal.    [provider]  Multiple Vitamin (MULTIVITAMIN WITH MINERALS) TABS tablet Take 1 tablet by mouth daily. 07/30/19   Enedina Finner, MD  nicotine (NICODERM CQ - DOSED IN MG/24 HOURS) 14 mg/24hr patch Place 1 patch (14 mg total) onto the skin daily. 09/11/19   Danford, Earl Lites, MD  NOVOLOG FLEXPEN 100 UNIT/ML FlexPen Inject 0-30 Units into the skin 3 (three) times daily. 09/10/19   Danford, Earl Lites, MD      VITAL SIGNS:  Blood pressure (!) 140/95, pulse 99, temperature (!) 95.1 F (35.1 C), temperature source Oral, resp. rate (!) 28, height 5\' 10"  (1.778 m), weight 61.7 kg, SpO2 100 %.  PHYSICAL EXAMINATION:  Physical Exam  GENERAL:  58 y.o.-year-old patient lying in the bed with no acute distress.  EYES: Pupils equal, round, reactive to light and accommodation. No scleral icterus. Extraocular muscles intact.  HEENT: Head atraumatic, normocephalic.  Oropharynx and nasopharynx clear.  NECK:  Supple, no jugular venous distention. No thyroid enlargement, no tenderness.  LUNGS: Normal breath sounds bilaterally, no wheezing, rales,rhonchi or crepitation. No use of accessory muscles of respiration.  CARDIOVASCULAR: Regular rate and rhythm, S1, S2 normal. No murmurs, rubs, or gallops.  ABDOMEN: Soft, nondistended, nontender. Bowel sounds present. No organomegaly or mass.  EXTREMITIES: No pedal edema, cyanosis, or clubbing.  NEUROLOGIC: Cranial nerves II through XII are intact. Muscle strength 5/5 in all extremities. Sensation intact. Gait not checked.  PSYCHIATRIC: The patient is alert and oriented x 3.  Normal affect and good eye contact. SKIN: No obvious rash, lesion, or ulcer.   LABORATORY PANEL:   CBC Recent Labs  Lab 09/12/19 1856  WBC 16.3*  HGB 12.7*  HCT 42.9  PLT 517*   ------------------------------------------------------------------------------------------------------------------  Chemistries  Recent Labs  Lab 09/09/19 0512  09/12/19 1950  NA 141   < > 139  K 3.4*   < > 7.1*  CL 109   < >  107  CO2 23   < > <7*  GLUCOSE 352*   < > 765*  BUN 18   < > 47*  CREATININE 1.03   < > 2.58*  CALCIUM 9.0   < > 8.6*  MG 2.1  --   --   AST  --   --  50*  ALT  --   --  27  ALKPHOS  --   --  111  BILITOT  --   --  2.0*   < > = values in this interval not displayed.   ------------------------------------------------------------------------------------------------------------------  Cardiac Enzymes No results for input(s): TROPONINI in the last 168 hours. ------------------------------------------------------------------------------------------------------------------  RADIOLOGY:  No results found.    IMPRESSION AND PLAN:   1.  Severe diabetic ketoacidosis with uncontrolled type 2 diabetes mellitus, likely secondary to non commitment to medical therapy.The patient will be admitted to an IMU bed and will be  placed on  IV insulin drip per [employer]. The patient will be aggressively hydrated with IV normal saline.  Will follow serial BMPs.  2.  Severe hyperkalemia.  The patient will be further given an extra ampule of sodium bicarbonate given severe acidosis and nebulized albuterol x2.  Will also add IV insulin bolus.  3.  Acute kidney injury.  BMP will be followed with aggressive hydration with normal saline.  We will stop lisinopril and metformin.  4.  Alcohol and cocaine abuse.  Will check urine drug screen.  He will be placed on alcohol withdrawal protocol with Precedex and Ativan.  The patient will be placed on a banana bag daily.  5.  Leukocytosis.  This likely due to stress demargination from DKA.  Will follow CBC.  He is hypothermic and warming blanket will be utilized.  I do not believe that it is related to sepsis.  We will follow his urinalysis.  We will also follow his chest x-ray.  6.  Hypertension.  We will stop his lisinopril given acute kidney injury and severe hyperkalemia.  He will be placed on as needed IV labetalol.  7.  Ongoing tobacco abuse.  Will utilize NicoDerm CQ patch as needed.  8.  DVT prophylaxis.  Subcutaneous heparin.  GI prophylaxis with continued PPI therapy given history of GERD and alcohol abuse.   All the records are reviewed and case discussed with ED provider. The plan of care was discussed in details with the patient (and family). I answered all questions. The patient agreed to proceed with the above mentioned plan. Further management will depend upon hospital course.   CODE STATUS: Full code  TOTAL CRITICAL CARE TIME TAKING CARE OF THIS PATIENT: 50 minutes.    Hannah BeatJan A Leandrew Keech M.D on 09/12/2019 at 8:48 PM  [facility]   From 7 PM-7 AM, contact night-coverage [email or web address]  CC: Primary care physician; Sherron Mondayejan-Sie, S Ahmed, MD   Note: This dictation was prepared with Dragon dictation along with smaller phrase technology. Any transcriptional errors that result  from this process are unintentional.

## 2019-09-13 ENCOUNTER — Other Ambulatory Visit: Payer: Self-pay

## 2019-09-13 DIAGNOSIS — I482 Chronic atrial fibrillation, unspecified: Secondary | ICD-10-CM

## 2019-09-13 LAB — BASIC METABOLIC PANEL
Anion gap: UNDETERMINED (ref 5–15)
Anion gap: 16 — ABNORMAL HIGH (ref 5–15)
Anion gap: 9 (ref 5–15)
Anion gap: 8 (ref 5–15)
Anion gap: 8 (ref 5–15)
BUN: 52 mg/dL — ABNORMAL HIGH (ref 6–20)
BUN: 42 mg/dL — ABNORMAL HIGH (ref 6–20)
BUN: 40 mg/dL — ABNORMAL HIGH (ref 6–20)
BUN: 38 mg/dL — ABNORMAL HIGH (ref 6–20)
BUN: 35 mg/dL — ABNORMAL HIGH (ref 6–20)
CO2: <7 mmol/L — ABNORMAL LOW (ref 22–32)
CO2: 14 mmol/L — ABNORMAL LOW (ref 22–32)
CO2: 19 mmol/L — ABNORMAL LOW (ref 22–32)
CO2: 19 mmol/L — ABNORMAL LOW (ref 22–32)
CO2: 20 mmol/L — ABNORMAL LOW (ref 22–32)
Calcium: 8.7 mg/dL — ABNORMAL LOW (ref 8.9–10.3)
Calcium: 9.1 mg/dL (ref 8.9–10.3)
Calcium: 8.7 mg/dL — ABNORMAL LOW (ref 8.9–10.3)
Calcium: 8.6 mg/dL — ABNORMAL LOW (ref 8.9–10.3)
Calcium: 8.8 mg/dL — ABNORMAL LOW (ref 8.9–10.3)
Chloride: 112 mmol/L — ABNORMAL HIGH (ref 98–111)
Chloride: 118 mmol/L — ABNORMAL HIGH (ref 98–111)
Chloride: 120 mmol/L — ABNORMAL HIGH (ref 98–111)
Chloride: 121 mmol/L — ABNORMAL HIGH (ref 98–111)
Chloride: 119 mmol/L — ABNORMAL HIGH (ref 98–111)
Creatinine, Ser: 2.83 mg/dL — ABNORMAL HIGH (ref 0.61–1.24)
Creatinine, Ser: 2.13 mg/dL — ABNORMAL HIGH (ref 0.61–1.24)
Creatinine, Ser: 1.63 mg/dL — ABNORMAL HIGH (ref 0.61–1.24)
Creatinine, Ser: 1.43 mg/dL — ABNORMAL HIGH (ref 0.61–1.24)
Creatinine, Ser: 1.46 mg/dL — ABNORMAL HIGH (ref 0.61–1.24)
GFR calc Af Amer: 27 mL/min — ABNORMAL LOW (ref >60)
GFR calc Af Amer: 38 mL/min — ABNORMAL LOW (ref >60)
GFR calc Af Amer: 52 mL/min — ABNORMAL LOW (ref >60)
GFR calc Af Amer: >60 mL/min (ref >60)
GFR calc Af Amer: 60 mL/min — ABNORMAL LOW (ref >60)
GFR calc non Af Amer: 23 mL/min — ABNORMAL LOW (ref >60)
GFR calc non Af Amer: 33 mL/min — ABNORMAL LOW (ref >60)
GFR calc non Af Amer: 45 mL/min — ABNORMAL LOW (ref >60)
GFR calc non Af Amer: 53 mL/min — ABNORMAL LOW (ref >60)
GFR calc non Af Amer: 52 mL/min — ABNORMAL LOW (ref >60)
Glucose, Bld: 655 mg/dL (ref 70–99)
Glucose, Bld: 204 mg/dL — ABNORMAL HIGH (ref 70–99)
Glucose, Bld: 124 mg/dL — ABNORMAL HIGH (ref 70–99)
Glucose, Bld: 114 mg/dL — ABNORMAL HIGH (ref 70–99)
Glucose, Bld: 233 mg/dL — ABNORMAL HIGH (ref 70–99)
Potassium: 5.6 mmol/L — ABNORMAL HIGH (ref 3.5–5.1)
Potassium: 4.3 mmol/L (ref 3.5–5.1)
Potassium: 3.7 mmol/L (ref 3.5–5.1)
Potassium: 4 mmol/L (ref 3.5–5.1)
Potassium: 4 mmol/L (ref 3.5–5.1)
Sodium: 144 mmol/L (ref 135–145)
Sodium: 148 mmol/L — ABNORMAL HIGH (ref 135–145)
Sodium: 148 mmol/L — ABNORMAL HIGH (ref 135–145)
Sodium: 148 mmol/L — ABNORMAL HIGH (ref 135–145)
Sodium: 147 mmol/L — ABNORMAL HIGH (ref 135–145)

## 2019-09-13 LAB — URINE DRUG SCREEN, QUALITATIVE (ARMC ONLY)
Amphetamines, Ur Screen: NOT DETECTED
Barbiturates, Ur Screen: NOT DETECTED
Benzodiazepine, Ur Scrn: NOT DETECTED
Cannabinoid 50 Ng, Ur ~~LOC~~: NOT DETECTED
Cocaine Metabolite,Ur ~~LOC~~: POSITIVE — AB
MDMA (Ecstasy)Ur Screen: NOT DETECTED
Methadone Scn, Ur: NOT DETECTED
Opiate, Ur Screen: NOT DETECTED
Phencyclidine (PCP) Ur S: NOT DETECTED
Tricyclic, Ur Screen: NOT DETECTED

## 2019-09-13 LAB — URINALYSIS, COMPLETE (UACMP) WITH MICROSCOPIC
Bilirubin Urine: NEGATIVE
Glucose, UA: >=500 mg/dL — AB
Ketones, ur: 80 mg/dL — AB
Leukocytes,Ua: NEGATIVE
Nitrite: NEGATIVE
Protein, ur: 30 mg/dL — AB
Specific Gravity, Urine: 1.015 (ref 1.005–1.030)
pH: 5 (ref 5.0–8.0)

## 2019-09-13 LAB — GLUCOSE, CAPILLARY
Glucose-Capillary: 122 mg/dL — ABNORMAL HIGH (ref 70–99)
Glucose-Capillary: 125 mg/dL — ABNORMAL HIGH (ref 70–99)
Glucose-Capillary: 146 mg/dL — ABNORMAL HIGH (ref 70–99)
Glucose-Capillary: 159 mg/dL — ABNORMAL HIGH (ref 70–99)
Glucose-Capillary: 171 mg/dL — ABNORMAL HIGH (ref 70–99)
Glucose-Capillary: 201 mg/dL — ABNORMAL HIGH (ref 70–99)
Glucose-Capillary: 243 mg/dL — ABNORMAL HIGH (ref 70–99)
Glucose-Capillary: 297 mg/dL — ABNORMAL HIGH (ref 70–99)
Glucose-Capillary: 366 mg/dL — ABNORMAL HIGH (ref 70–99)
Glucose-Capillary: 476 mg/dL — ABNORMAL HIGH (ref 70–99)
Glucose-Capillary: 91 mg/dL (ref 70–99)
Glucose-Capillary: 95 mg/dL (ref 70–99)
Glucose-Capillary: 96 mg/dL (ref 70–99)
Glucose-Capillary: >600 mg/dL (ref 70–99)

## 2019-09-13 LAB — MAGNESIUM: Magnesium: 2.2 mg/dL (ref 1.7–2.4)

## 2019-09-13 LAB — HEMOGLOBIN A1C
Hgb A1c MFr Bld: 13.6 % — ABNORMAL HIGH (ref 4.8–5.6)
Mean Plasma Glucose: 343.62 mg/dL

## 2019-09-13 LAB — BETA-HYDROXYBUTYRIC ACID
Beta-Hydroxybutyric Acid: 0.81 mmol/L — ABNORMAL HIGH (ref 0.05–0.27)
Beta-Hydroxybutyric Acid: 1.43 mmol/L — ABNORMAL HIGH (ref 0.05–0.27)
Beta-Hydroxybutyric Acid: 2.13 mmol/L — ABNORMAL HIGH (ref 0.05–0.27)

## 2019-09-13 LAB — PHOSPHORUS: Phosphorus: 2.5 mg/dL (ref 2.5–4.6)

## 2019-09-13 MED ORDER — SODIUM CHLORIDE 0.45 % IV SOLN
INTRAVENOUS | Status: DC
  Administered 2019-09-13: 18:00:00 via INTRAVENOUS

## 2019-09-13 MED ORDER — INSULIN ASPART 100 UNIT/ML ~~LOC~~ SOLN
0.0000 [IU] | Freq: Three times a day (TID) | SUBCUTANEOUS | Status: DC
  Administered 2019-09-13: 2 [IU] via SUBCUTANEOUS
  Administered 2019-09-14 (×2): 3 [IU] via SUBCUTANEOUS
  Administered 2019-09-14: 5 [IU] via SUBCUTANEOUS
  Administered 2019-09-15: 2 [IU] via SUBCUTANEOUS
  Administered 2019-09-15: 3 [IU] via SUBCUTANEOUS
  Filled 2019-09-13 (×7): qty 1

## 2019-09-13 MED ORDER — INSULIN GLARGINE 100 UNIT/ML ~~LOC~~ SOLN
20.0000 [IU] | Freq: Every day | SUBCUTANEOUS | Status: DC
  Administered 2019-09-13 – 2019-09-15 (×3): 20 [IU] via SUBCUTANEOUS
  Filled 2019-09-13 (×4): qty 0.2

## 2019-09-13 MED ORDER — INSULIN ASPART 100 UNIT/ML ~~LOC~~ SOLN: 0.0000 [IU] | Freq: Every day | SUBCUTANEOUS | Status: DC

## 2019-09-13 NOTE — ED Notes (Signed)
Pt just told this RN to "get me some breakfast woman".

## 2019-09-13 NOTE — ED Notes (Signed)
Insulin rate change verified with Lea RN. 

## 2019-09-13 NOTE — ED Notes (Signed)
Rate change on insulin verified with Lea, RN.

## 2019-09-13 NOTE — ED Notes (Signed)
Rate change on insulin verified with Lea, RN. 

## 2019-09-13 NOTE — ED Notes (Signed)
Floor states 1C closed temporarily so pt is to be 1A's. Called 1A and told Charge RN would call this RN back soon. Ascom number given.

## 2019-09-13 NOTE — ED Notes (Signed)
Received banana bag from pharm. Will hang soon.

## 2019-09-13 NOTE — ED Notes (Addendum)
Pt found standing out of bed pulling monitor leads off, tugging at IVs and peeing and pooping on side of bed. Pt asked why he didn't use the call bell and he stated, "I just got up." Infusions paused. Pt assisted to bathroom. Pt cleaned and given new gown and brief. EVS requested to mop floor.

## 2019-09-13 NOTE — ED Notes (Signed)
Attempted report to 2C.  

## 2019-09-13 NOTE — ED Notes (Signed)
Pt given cup of milk as requested.

## 2019-09-13 NOTE — ED Notes (Signed)
Insulin rate change verified with Lea RN.

## 2019-09-13 NOTE — ED Notes (Signed)
Pt states that he is ready to go home at this time and does not understand what we are "doing to him".

## 2019-09-13 NOTE — ED Notes (Signed)
Attempted report to floor.  

## 2019-09-13 NOTE — ED Notes (Signed)
PT given food tray and diet coke.

## 2019-09-13 NOTE — ED Notes (Signed)
Attempted report to floor again.  
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
## 2019-09-13 NOTE — ED Notes (Signed)
Patient incontinent of soft brown stool, does follow commands ie turn side to side to clean up. Left on side. Coccyx noted reddened.

## 2019-09-14 LAB — BASIC METABOLIC PANEL
Anion gap: 8 (ref 5–15)
Anion gap: 9 (ref 5–15)
BUN: 29 mg/dL — ABNORMAL HIGH (ref 6–20)
BUN: 32 mg/dL — ABNORMAL HIGH (ref 6–20)
CO2: 16 mmol/L — ABNORMAL LOW (ref 22–32)
CO2: 17 mmol/L — ABNORMAL LOW (ref 22–32)
Calcium: 8.4 mg/dL — ABNORMAL LOW (ref 8.9–10.3)
Calcium: 8.8 mg/dL — ABNORMAL LOW (ref 8.9–10.3)
Chloride: 114 mmol/L — ABNORMAL HIGH (ref 98–111)
Chloride: 119 mmol/L — ABNORMAL HIGH (ref 98–111)
Creatinine, Ser: 1.2 mg/dL (ref 0.61–1.24)
Creatinine, Ser: 1.3 mg/dL — ABNORMAL HIGH (ref 0.61–1.24)
GFR calc Af Amer: >60 mL/min (ref >60)
GFR calc Af Amer: >60 mL/min (ref >60)
GFR calc non Af Amer: 59 mL/min — ABNORMAL LOW (ref >60)
GFR calc non Af Amer: >60 mL/min (ref >60)
Glucose, Bld: 275 mg/dL — ABNORMAL HIGH (ref 70–99)
Glucose, Bld: 377 mg/dL — ABNORMAL HIGH (ref 70–99)
Potassium: 3.6 mmol/L (ref 3.5–5.1)
Potassium: 3.9 mmol/L (ref 3.5–5.1)
Sodium: 139 mmol/L (ref 135–145)
Sodium: 144 mmol/L (ref 135–145)

## 2019-09-14 LAB — GLUCOSE, CAPILLARY
Glucose-Capillary: 271 mg/dL — ABNORMAL HIGH (ref 70–99)
Glucose-Capillary: 407 mg/dL — ABNORMAL HIGH (ref 70–99)
Glucose-Capillary: 161 mg/dL — ABNORMAL HIGH (ref 70–99)
Glucose-Capillary: 213 mg/dL — ABNORMAL HIGH (ref 70–99)
Glucose-Capillary: 161 mg/dL — ABNORMAL HIGH (ref 70–99)
Glucose-Capillary: 119 mg/dL — ABNORMAL HIGH (ref 70–99)

## 2019-09-14 LAB — MAGNESIUM
Magnesium: 1.9 mg/dL (ref 1.7–2.4)
Magnesium: 2.1 mg/dL (ref 1.7–2.4)

## 2019-09-14 LAB — PHOSPHORUS
Phosphorus: 2.4 mg/dL — ABNORMAL LOW (ref 2.5–4.6)
Phosphorus: 2.5 mg/dL (ref 2.5–4.6)

## 2019-09-14 LAB — SARS CORONAVIRUS 2 (TAT 6-24 HRS): SARS Coronavirus 2: NEGATIVE

## 2019-09-14 MED ORDER — ENSURE MAX PROTEIN PO LIQD
11.0000 [oz_av] | Freq: Two times a day (BID) | ORAL | Status: DC
  Administered 2019-09-15 (×2): 11 [oz_av] via ORAL
  Filled 2019-09-14: qty 330

## 2019-09-14 MED ORDER — LINAGLIPTIN 5 MG PO TABS
5.0000 mg | ORAL_TABLET | Freq: Every day | ORAL | Status: DC
  Administered 2019-09-14 – 2019-09-15 (×2): 5 mg via ORAL
  Filled 2019-09-14 (×2): qty 1

## 2019-09-14 MED ORDER — INSULIN ASPART 100 UNIT/ML ~~LOC~~ SOLN
15.0000 [IU] | Freq: Once | SUBCUTANEOUS | Status: AC
  Administered 2019-09-14: 15 [IU] via SUBCUTANEOUS
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
## 2019-09-14 NOTE — Progress Notes (Signed)
Initial Nutrition Assessment  RD working remotely.  DOCUMENTATION CODES:   Not applicable  INTERVENTION:  Provide double protein with meals.  Provide Ensure Max Protein po BID, each supplement provides 150 kcal and 30 grams of protein.  Provide daily MVI.  RD reviewed "Carbohydrate Counting for People with Diabetes" handout from the Academy of Nutrition and Dietetics with patient over the phone. Will mail handout to patient. Discussed different food groups and their effects on blood sugar, emphasizing carbohydrate-containing foods. Provided list of carbohydrates and recommended serving sizes of common foods. Discussed importance of controlled and consistent carbohydrate intake throughout the day. Provided examples of ways to balance meals/snacks and encouraged intake of high-fiber, whole grain complex carbohydrates. RD reviewed "Using Nutrition Labels: Carbohydrates" handout from the Academy of [employer] and [employer]etics. Will mail handout to patient. Discussed how to read a nutrition label including looking at serving size and servings per container. Reviewed that there are 15 grams of carbohydrate in one carbohydrate choice. Encouraged patient to use chart for range of carbohydrate grams per choice when reading nutrition labels.Teach back method used. Expect fair compliance.  NUTRITION DIAGNOSIS:   Inadequate oral intake(PTA) related to social / environmental circumstances(EtOH abuse, substance abuse) as evidenced by per patient/family report.  GOAL:   Patient will meet greater than or equal to 90% of their needs  MONITOR:   PO intake, Supplement acceptance, Labs, Weight trends, Skin, I & O's  REASON FOR ASSESSMENT:        ASSESSMENT:   58 year old male with PMHx of HTN, substance abuse, EtOH abuse, DM, GERD, A-fib not on any anticoagulation admitted with DKA, AKI.   Spoke with patient over the phone. He reports he was only home about 36 hours prior to coming back into the  hospital. He had been working on eating better so he had eaten a steak meal at a restaurant and had also gone to his father's house to eat a meal (meat with sides). He reports he does not eat enough when not in the hospital still and he would like to gain weight. He reports he feels "okay" about his DM management. He is unable to describe details but reports he "gives himself a shot when it's high." Patient is amenable to diet education regarding nutritional management of DM. He requests the handouts be mailed to his father's house ([address] [place], [place], [place] [address]).   Weights fluctuate in chart over the past year from 60-70 kg. Patient reports he is around 135 lbs now. Current weight in chart is 61.7 kg (136.02 lbs).  Medications reviewed and include: Novolog 0-15 units TID, Novolog 0-5 units QHS, Lantus 20 units daily, Tradjenta 5 mg daily, MVI daily, nicotine patch, pantoprazole.  Labs reviewed: CBG 161-213, Chloride 114, CO2 17, BUN 29, Creatinine 1.3.  Patient typically meets criteria for severe malnutrition. RD suspects patient is still severely malnourished but unable to confirm without completing NFPE.  NUTRITION - FOCUSED PHYSICAL EXAM:  Unable to complete at this time.   Diet Order:   Diet Order            Diet Carb Modified Fluid consistency: Thin; Room service appropriate? Yes  Diet effective now             EDUCATION NEEDS:   Education needs have been addressed  Skin:  Skin Assessment: Reviewed RN Assessment  Last BM:  Unknown  Height:   Ht Readings from Last 1 Encounters:  09/12/19 5\' 10"  (1.778 m)  Weight:   Wt Readings from Last 1 Encounters:  09/12/19 61.7 kg   Ideal Body Weight:  75.5 kg  BMI:  Body mass index is 19.52 kg/m.  Estimated Nutritional Needs:   Kcal:  1900-2100  Protein:  95-105 grams  Fluid:  1.9-2.1 L/day  Jacklynn Barnacle, MS, RD, LDN Office: [phone] Pager: [phone] After Hours/Weekend Pager: [phone]

## 2019-09-14 NOTE — Progress Notes (Signed)
Inpatient Diabetes Program Recommendations  AACE/ADA: New Consensus Statement on Inpatient Glycemic Control (2015)  Target Ranges:  Prepandial:   less than 140 mg/dL      Peak postprandial:   less than 180 mg/dL (1-2 hours)      Critically ill patients:  140 - 180 mg/dL   Lab Results  Component Value Date   GLUCAP 161 (H) 09/14/2019   HGBA1C 13.6 (H) 09/12/2019    Review of Glycemic Control  Diabetes history: DM2 Outpatient Diabetes medications: Lantus 22 units QD, metformin 1000 mg bid, Novolog 0-30 units tidwc Current orders for Inpatient glycemic control: Lantus 20 units QD, Novolog 0-15 units tidwc and 0-5 units QHS, tradjenta 5 mg QD  HgbA1C - 13.6% - poor glycemic control Frequent admissions for DKA CO2 still low at 17. Blood sugars today 161-407. Has been seen on multiple occasions by various Diabetes Coordinators  Inpatient Diabetes Program Recommendations:     Decrease Novolog correction to 0-9 units tidwc and 0-5 units QHS Add Novolog 2-3 units tidwc for meal coverage insulin if pt eats > 50% meal  Will follow closely.  Thank you. Lorenda Peck, RD, LDN, CDE Inpatient Diabetes Coordinator [phone]

## 2019-09-14 NOTE — Progress Notes (Signed)
Pt took off telemetery monitor stated "I don't want this thing on." MD notified.

## 2019-09-15 LAB — GLUCOSE, CAPILLARY
Glucose-Capillary: 239 mg/dL — ABNORMAL HIGH (ref 70–99)
Glucose-Capillary: 228 mg/dL — ABNORMAL HIGH (ref 70–99)
Glucose-Capillary: 173 mg/dL — ABNORMAL HIGH (ref 70–99)
Glucose-Capillary: 135 mg/dL — ABNORMAL HIGH (ref 70–99)

## 2019-09-15 LAB — BASIC METABOLIC PANEL
Anion gap: 7 (ref 5–15)
BUN: 18 mg/dL (ref 6–20)
CO2: 21 mmol/L — ABNORMAL LOW (ref 22–32)
Calcium: 8.3 mg/dL — ABNORMAL LOW (ref 8.9–10.3)
Chloride: 111 mmol/L (ref 98–111)
Creatinine, Ser: 0.95 mg/dL (ref 0.61–1.24)
GFR calc Af Amer: >60 mL/min (ref >60)
GFR calc non Af Amer: >60 mL/min (ref >60)
Glucose, Bld: 236 mg/dL — ABNORMAL HIGH (ref 70–99)
Potassium: 3.4 mmol/L — ABNORMAL LOW (ref 3.5–5.1)
Sodium: 139 mmol/L (ref 135–145)

## 2019-09-15 MED ORDER — SITAGLIPTIN PHOSPHATE 50 MG PO TABS: 50.0000 mg | ORAL_TABLET | Freq: Every day | ORAL | 3 refills | Status: DC

## 2019-09-15 NOTE — Progress Notes (Signed)
Spencer [name] to be D/C'd home per MD order.  Discussed prescriptions and follow up appointments with the patient. Prescriptions given to patient, medication list explained in detail. Pt verbalized understanding.  Allergies as of 09/15/2019   No Known Allergies     Medication List    STOP taking these medications   lisinopril 5 MG tablet Commonly known as: ZESTRIL     TAKE these medications   aspirin EC 81 MG tablet Take 1 tablet (81 mg total) by mouth daily.   insulin glargine 100 unit/mL Sopn Commonly known as: LANTUS Inject 0.22 mLs (22 Units total) into the skin daily.   metFORMIN 1000 MG tablet Commonly known as: GLUCOPHAGE Take 1,000 mg by mouth 2 (two) times daily with a meal.   multivitamin with minerals Tabs tablet Take 1 tablet by mouth daily.   nicotine 14 mg/24hr patch Commonly known as: NICODERM CQ - dosed in mg/24 hours Place 1 patch (14 mg total) onto the skin daily.   NovoLOG FlexPen 100 UNIT/ML FlexPen Generic drug: insulin aspart Inject 0-30 Units into the skin 3 (three) times daily.   pantoprazole 40 MG tablet Commonly known as: PROTONIX Take 1 tablet (40 mg total) by mouth daily.   sitaGLIPtin 50 MG tablet Commonly known as: JANUVIA Take 1 tablet (50 mg total) by mouth daily.       Vitals:   09/14/19 1943 09/15/19 0358  BP: 124/81 124/74  Pulse: 70 72  Resp: 20 20  Temp: 97.7 F (36.5 C) 98 F (36.7 C)  SpO2: 99% 100%    Skin clean, dry and intact without evidence of skin break down, no evidence of skin tears noted. IV catheter discontinued intact. Site without signs and symptoms of complications. Dressing and pressure applied. Pt denies pain at this time. No complaints noted.  An After Visit Summary was printed and given to the patient. Patient escorted via [place], and D/C home via private auto.  Chuck Hint RN [facility] [employer]

## 2019-09-15 NOTE — TOC Initial Note (Signed)
Transition of Care [facility]) - Initial/Assessment Note    Patient Details  Name: Spencer [name] MRN: [id number] Date of Birth: 01-26-1961  [facility]) CM/SW Contact:    Beverly Sessions, RN Phone Number: 09/15/2019, 4:08 PM  Clinical Narrative:                 Patient admitted with DKA  Patient states that he has resided at a [facility] for the last 2 years.  States that all of his belongings are still there.  Patient states that his sister will be picking him up today.  He will be staying at his dads home after discharge.  Patient states that he doesn't want to be at the boarding house any more  Previous admissions TOC discussed ALF and family care home with patient.  Patient declined at that time.  RNCM discussed again this admission.  Patient states that he is still not at a point where he wants to sign over his check and go to any type of facility.  He states "I still have things I want to follow up on before I make any decisions". RNCM provided list of facilities and contact information.   RNCM discussed used of illicit drugs with patient.  He states "I don't do drugs like that I don't need help.  Yeah I do a line every now any then if its available, but not all the time".  Patient agreeable to written resources for outpatient and residential drug treatment.  Patient has PCP appointment for 09/26/19 - He is aware of the appointment and request for it not to be moved up any sooner.    Expected Discharge Plan: Home/Self Care     Patient Goals and CMS Choice        Expected Discharge Plan and Services Expected Discharge Plan: Home/Self Care   Discharge Planning Services: CM Consult     Expected Discharge Date: 09/15/19                         HH Arranged: Patient Refused HH          Prior Living Arrangements/Services     Patient language and need for interpreter reviewed:: Yes Do you feel safe going back to the place where you live?: Yes      Need  for Family Participation in Patient Care: Yes (Comment) Care giver support system in place?: Yes (comment)   Criminal Activity/Legal Involvement Pertinent to Current Situation/Hospitalization: No - Comment as needed  Activities of Daily Living Home Assistive Devices/Equipment: None ADL Screening (condition at time of admission) Patient's cognitive ability adequate to safely complete daily activities?: Yes Is the patient deaf or have difficulty hearing?: No Does the patient have difficulty seeing, even when wearing glasses/contacts?: No Does the patient have difficulty concentrating, remembering, or making decisions?: No Patient able to express need for assistance with ADLs?: Yes Does the patient have difficulty dressing or bathing?: No Independently performs ADLs?: Yes (appropriate for developmental age) Does the patient have difficulty walking or climbing stairs?: No Weakness of Legs: None Weakness of Arms/Hands: None  Permission Sought/Granted Permission sought to share information with : Case Manager                Emotional Assessment       Orientation: : Oriented to Self, Oriented to Place, Oriented to  Time, Oriented to Situation Alcohol / Substance Use: Tobacco Use, Illicit Drugs    Admission diagnosis:  Hypothermia [T68.XXXA] Diabetic ketoacidosis without coma associated with diabetes mellitus due to underlying condition (HCC) [E08.10] DKA (diabetic ketoacidoses) (HCC) [E11.10] Patient Active Problem List   Diagnosis Date Noted  . Tobacco abuse counseling   . Atrial fibrillation with RVR (HCC) 08/07/2019  . GERD (gastroesophageal reflux disease) 08/07/2019  . Acute renal failure (HCC)   . Hyperkalemia   . Sepsis (HCC) 06/09/2019  . AKI (acute kidney injury) (HCC) 02/19/2019  . Hypoglycemia 10/23/2018  . Chronic low back pain 01/16/2017  . Chronic neck pain 01/16/2017  . Diabetic neuropathy (HCC) 01/16/2017  . DM2 (diabetes mellitus, type 2) (HCC) 01/16/2017   . History of cocaine abuse (HCC) 01/16/2017  . Personal history of subdural hematoma 01/16/2017  . Closed displaced fracture of body of left calcaneus with delayed healing 12/13/2016  . Protein-calorie malnutrition, severe 11/08/2016  . DKA, type 2 (HCC) 11/06/2016  . DKA (diabetic ketoacidoses) (HCC) 04/03/2015  . Hypertension 04/03/2015  . Depression 04/03/2015  . Opiate dependence (HCC) 04/03/2015  . Tobacco abuse 04/03/2015  . Lumbosacral neuritis 07/19/2014  . Pain of finger of right hand 07/19/2014  . Type II or unspecified type diabetes mellitus without mention of complication, not stated as uncontrolled 07/19/2014  . Knee pain 09/12/2013  . Hernia of flank 09/20/2012  . Epidermoid cyst of skin 09/20/2012  . Chronic pain of both shoulders 03/27/2012   PCP:  [name], MD Pharmacy:   [facility] [id number] [place], [place] - [address] AT [facility] [address] [place] [address] Phone: [phone] Fax: [phone]     Social Determinants of Health (SDOH) Interventions    Readmission Risk Interventions Readmission Risk Prevention Plan 09/15/2019 09/05/2019 08/21/2019  Transportation Screening Complete - Complete  PCP or Specialist Appt within 3-5 Days - - -  HRI or Home Care Consult - - -  HRI or Home Care Consult comments - - -  Palliative Care Screening - - -  Medication Review (RN Care Manager) Complete - Complete  PCP or Specialist appointment within 3-5 days of discharge (No Data) Complete -  HRI or Home Care Consult Patient refused - -  SW Recovery Care/Counseling Consult - - Complete  SW Consult Not Complete Comments - - -  Palliative Care Screening Not Applicable Not Applicable Not Applicable  Skilled Nursing Facility Not Applicable Not Applicable Not Applicable  Some recent data might be hidden

## 2019-09-15 NOTE — Progress Notes (Signed)
Inpatient Diabetes Program Recommendations  AACE/ADA: New Consensus Statement on Inpatient Glycemic Control (2015)  Target Ranges:  Prepandial:   less than 140 mg/dL      Peak postprandial:   less than 180 mg/dL (1-2 hours)      Critically ill patients:  140 - 180 mg/dL   Results for RIGOBERTO, REPASS (MRN [id number]) as of 09/15/2019 06:06  Ref. Range 09/14/2019 01:45 09/14/2019 04:38 09/14/2019 07:52 09/14/2019 12:03 09/14/2019 16:47 09/14/2019 21:17  Glucose-Capillary Latest Ref Range: 70 - 99 mg/dL 271 (H) 407 (H)  15 units NOVOLOG  161 (H)  3 units NOVOLOG   213 (H)  5 units NOVOLOG +  20 units LANTUS given at 10am  161 (H)  3 units NOVOLOG  119 (H)   Results for LAMERE, LIGHTNER (MRN [id number]) as of 09/15/2019 06:06  Ref. Range 09/15/2019 05:03  Glucose Latest Ref Range: 70 - 99 mg/dL 236 (H)   Results for TERRIAN, SENTELL (MRN [id number]) as of 09/15/2019 06:06  Ref. Range 05/30/2019 04:46 08/19/2019 02:03 09/12/2019 21:43  Hemoglobin A1C Latest Ref Range: 4.8 - 5.6 % 13.8 (H) 11.8 (H) 13.6 (H)   Admit with: Severe DKA  History: Type 1 Diabetes  Home DM Meds: Lantus 22 units Daily       Metformin 1000 mg BID       Novolog 0-30 units TID  Current Orders: Lantus 20 units Daily      Novolog Moderate Correction Scale/ SSI (0-15 units) TID AC + HS      Tradjenta 5 mg Daily    MD- Please consider the following in-hospital insulin adjustments:  1. Increase Lantus to 22 units Daily  2. Decrease Novolog SSI to the Sensitive scale (0-9 units) TID AC + HS  3. Start Novolog Meal Coverage: Novolog 3 units TID with meals  (Please add the following Hold Parameters: Hold if pt eats <50% of meal, Hold if pt NPO)       NOTE: Patient is well known to inpatient diabetes team. Patient was just inpatient 08/07/19 to 08/08/19 and 08/19/19 to 08/21/19 and 09/04/19 to 09/10/19 to and was last seen by diabetes coordinator on 08/08/19. Recommend adjusting outpatient Novolog to similar regimen used  as an inpatient to help improve outpatient DM control and encourage patient to re-establish care with Endocrinologist.Noted patient cocaine positive(07/27/19, 08/07/19, 08/19/19, 09/04/19, and 09/13/19)whichis likelyeffecting patient's ability and willingness to manage DM.    --Will follow patient during hospitalization--  Wyn Quaker RN, MSN, CDE Diabetes Coordinator Inpatient Glycemic Control Team Team Pager: [phone] (8a-5p)
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
## 2019-10-23 DIAGNOSIS — N179 Acute kidney failure, unspecified: Secondary | ICD-10-CM | POA: Insufficient documentation

## 2019-10-24 ENCOUNTER — Emergency Department: Payer: Medicaid Other

## 2019-10-24 ENCOUNTER — Encounter: Payer: Self-pay | Admitting: Emergency Medicine

## 2019-10-24 ENCOUNTER — Inpatient Hospital Stay
Admission: EM | Admit: 2019-10-24 | Discharge: 2019-10-26 | DRG: 638 | Disposition: A | Discharge status: Home / Self Care | Payer: Medicaid Other | Attending: Internal Medicine | Admitting: Internal Medicine

## 2019-10-24 ENCOUNTER — Other Ambulatory Visit: Payer: Self-pay

## 2019-10-24 DIAGNOSIS — F17200 Nicotine dependence, unspecified, uncomplicated: Secondary | ICD-10-CM | POA: Diagnosis present

## 2019-10-24 DIAGNOSIS — F1011 Alcohol abuse, in remission: Secondary | ICD-10-CM | POA: Diagnosis present

## 2019-10-24 DIAGNOSIS — R4182 Altered mental status, unspecified: Secondary | ICD-10-CM | POA: Diagnosis present

## 2019-10-24 DIAGNOSIS — E1110 Type 2 diabetes mellitus with ketoacidosis without coma: Principal | ICD-10-CM | POA: Diagnosis present

## 2019-10-24 DIAGNOSIS — I959 Hypotension, unspecified: Secondary | ICD-10-CM | POA: Diagnosis present

## 2019-10-24 DIAGNOSIS — I10 Essential (primary) hypertension: Secondary | ICD-10-CM | POA: Diagnosis present

## 2019-10-24 DIAGNOSIS — K219 Gastro-esophageal reflux disease without esophagitis: Secondary | ICD-10-CM | POA: Diagnosis present

## 2019-10-24 DIAGNOSIS — D72823 Leukemoid reaction: Secondary | ICD-10-CM | POA: Diagnosis present

## 2019-10-24 DIAGNOSIS — F1920 Other psychoactive substance dependence, uncomplicated: Secondary | ICD-10-CM | POA: Diagnosis not present

## 2019-10-24 DIAGNOSIS — N179 Acute kidney failure, unspecified: Secondary | ICD-10-CM | POA: Diagnosis present

## 2019-10-24 DIAGNOSIS — Z794 Long term (current) use of insulin: Secondary | ICD-10-CM | POA: Diagnosis not present

## 2019-10-24 DIAGNOSIS — Z79899 Other long term (current) drug therapy: Secondary | ICD-10-CM

## 2019-10-24 DIAGNOSIS — E1159 Type 2 diabetes mellitus with other circulatory complications: Secondary | ICD-10-CM | POA: Diagnosis not present

## 2019-10-24 DIAGNOSIS — F10129 Alcohol abuse with intoxication, unspecified: Secondary | ICD-10-CM | POA: Diagnosis not present

## 2019-10-24 DIAGNOSIS — Z20828 Contact with and (suspected) exposure to other viral communicable diseases: Secondary | ICD-10-CM | POA: Diagnosis present

## 2019-10-24 DIAGNOSIS — F1411 Cocaine abuse, in remission: Secondary | ICD-10-CM | POA: Diagnosis present

## 2019-10-24 DIAGNOSIS — E860 Dehydration: Secondary | ICD-10-CM | POA: Diagnosis present

## 2019-10-24 DIAGNOSIS — Z8679 Personal history of other diseases of the circulatory system: Secondary | ICD-10-CM

## 2019-10-24 DIAGNOSIS — E1165 Type 2 diabetes mellitus with hyperglycemia: Secondary | ICD-10-CM | POA: Diagnosis not present

## 2019-10-24 DIAGNOSIS — Z8249 Family history of ischemic heart disease and other diseases of the circulatory system: Secondary | ICD-10-CM | POA: Diagnosis not present

## 2019-10-24 DIAGNOSIS — Z8619 Personal history of other infectious and parasitic diseases: Secondary | ICD-10-CM | POA: Diagnosis not present

## 2019-10-24 DIAGNOSIS — E1311 Other specified diabetes mellitus with ketoacidosis with coma: Secondary | ICD-10-CM

## 2019-10-24 DIAGNOSIS — Z7982 Long term (current) use of aspirin: Secondary | ICD-10-CM | POA: Diagnosis not present

## 2019-10-24 DIAGNOSIS — E1310 Other specified diabetes mellitus with ketoacidosis without coma: Secondary | ICD-10-CM | POA: Diagnosis not present

## 2019-10-24 DIAGNOSIS — D72829 Elevated white blood cell count, unspecified: Secondary | ICD-10-CM | POA: Diagnosis present

## 2019-10-24 DIAGNOSIS — E1139 Type 2 diabetes mellitus with other diabetic ophthalmic complication: Secondary | ICD-10-CM | POA: Diagnosis not present

## 2019-10-24 DIAGNOSIS — E875 Hyperkalemia: Secondary | ICD-10-CM | POA: Diagnosis present

## 2019-10-24 DIAGNOSIS — F1910 Other psychoactive substance abuse, uncomplicated: Secondary | ICD-10-CM | POA: Diagnosis not present

## 2019-10-24 LAB — BLOOD GAS, VENOUS
Acid-base deficit: 27.6 mmol/L — ABNORMAL HIGH (ref 0.0–2.0)
Bicarbonate: 4.3 mmol/L — ABNORMAL LOW (ref 20.0–28.0)
O2 Saturation: 40.3 %
Patient temperature: 37
pCO2, Ven: 22 mmHg — ABNORMAL LOW (ref 44.0–60.0)
pH, Ven: 6.9 — CL (ref 7.250–7.430)
pO2, Ven: 42 mmHg (ref 32.0–45.0)

## 2019-10-24 LAB — BASIC METABOLIC PANEL
Anion gap: 18 — ABNORMAL HIGH (ref 5–15)
BUN: 65 mg/dL — ABNORMAL HIGH (ref 6–20)
BUN: 53 mg/dL — ABNORMAL HIGH (ref 6–20)
CO2: <7 mmol/L — ABNORMAL LOW (ref 22–32)
CO2: 15 mmol/L — ABNORMAL LOW (ref 22–32)
Calcium: 9.1 mg/dL (ref 8.9–10.3)
Calcium: 9.2 mg/dL (ref 8.9–10.3)
Chloride: 103 mmol/L (ref 98–111)
Chloride: 114 mmol/L — ABNORMAL HIGH (ref 98–111)
Creatinine, Ser: 2.91 mg/dL — ABNORMAL HIGH (ref 0.61–1.24)
Creatinine, Ser: 2.07 mg/dL — ABNORMAL HIGH (ref 0.61–1.24)
GFR calc Af Amer: 26 mL/min — ABNORMAL LOW (ref >60)
GFR calc Af Amer: 39 mL/min — ABNORMAL LOW (ref >60)
GFR calc non Af Amer: 22 mL/min — ABNORMAL LOW (ref >60)
GFR calc non Af Amer: 34 mL/min — ABNORMAL LOW (ref >60)
Glucose, Bld: 715 mg/dL (ref 70–99)
Glucose, Bld: 283 mg/dL — ABNORMAL HIGH (ref 70–99)
Potassium: 5.3 mmol/L — ABNORMAL HIGH (ref 3.5–5.1)
Potassium: 4.3 mmol/L (ref 3.5–5.1)
Sodium: 140 mmol/L (ref 135–145)
Sodium: 147 mmol/L — ABNORMAL HIGH (ref 135–145)

## 2019-10-24 LAB — URINALYSIS, COMPLETE (UACMP) WITH MICROSCOPIC
Bacteria, UA: NONE SEEN
Bilirubin Urine: NEGATIVE
Glucose, UA: >=500 mg/dL — AB
Ketones, ur: 80 mg/dL — AB
Leukocytes,Ua: NEGATIVE
Nitrite: NEGATIVE
Protein, ur: 30 mg/dL — AB
Specific Gravity, Urine: 1.015 (ref 1.005–1.030)
pH: 6 (ref 5.0–8.0)

## 2019-10-24 LAB — COMPREHENSIVE METABOLIC PANEL
ALT: 30 U/L (ref 0–44)
AST: 16 U/L (ref 15–41)
Albumin: 4.2 g/dL (ref 3.5–5.0)
Alkaline Phosphatase: 113 U/L (ref 38–126)
BUN: 69 mg/dL — ABNORMAL HIGH (ref 6–20)
CO2: <7 mmol/L — ABNORMAL LOW (ref 22–32)
Calcium: 8.8 mg/dL — ABNORMAL LOW (ref 8.9–10.3)
Chloride: 95 mmol/L — ABNORMAL LOW (ref 98–111)
Creatinine, Ser: 3.06 mg/dL — ABNORMAL HIGH (ref 0.61–1.24)
GFR calc Af Amer: 24 mL/min — ABNORMAL LOW (ref >60)
GFR calc non Af Amer: 21 mL/min — ABNORMAL LOW (ref >60)
Glucose, Bld: 1013 mg/dL (ref 70–99)
Potassium: 7.3 mmol/L (ref 3.5–5.1)
Sodium: 134 mmol/L — ABNORMAL LOW (ref 135–145)
Total Bilirubin: 2.2 mg/dL — ABNORMAL HIGH (ref 0.3–1.2)
Total Protein: 6.8 g/dL (ref 6.5–8.1)

## 2019-10-24 LAB — GLUCOSE, CAPILLARY
Glucose-Capillary: >600 mg/dL (ref 70–99)
Glucose-Capillary: >600 mg/dL (ref 70–99)
Glucose-Capillary: >600 mg/dL (ref 70–99)
Glucose-Capillary: 550 mg/dL (ref 70–99)
Glucose-Capillary: 570 mg/dL (ref 70–99)
Glucose-Capillary: 404 mg/dL — ABNORMAL HIGH (ref 70–99)
Glucose-Capillary: 497 mg/dL — ABNORMAL HIGH (ref 70–99)
Glucose-Capillary: 330 mg/dL — ABNORMAL HIGH (ref 70–99)
Glucose-Capillary: 292 mg/dL — ABNORMAL HIGH (ref 70–99)
Glucose-Capillary: 254 mg/dL — ABNORMAL HIGH (ref 70–99)
Glucose-Capillary: 190 mg/dL — ABNORMAL HIGH (ref 70–99)

## 2019-10-24 LAB — CBC WITH DIFFERENTIAL/PLATELET
Abs Immature Granulocytes: 0.23 10*3/uL — ABNORMAL HIGH (ref 0.00–0.07)
Basophils Absolute: 0.1 10*3/uL (ref 0.0–0.1)
Basophils Relative: 0 %
Eosinophils Absolute: 0 10*3/uL (ref 0.0–0.5)
Eosinophils Relative: 0 %
HCT: 41.3 % (ref 39.0–52.0)
Hemoglobin: 12.5 g/dL — ABNORMAL LOW (ref 13.0–17.0)
Immature Granulocytes: 1 %
Lymphocytes Relative: 5 %
Lymphs Abs: 1.3 10*3/uL (ref 0.7–4.0)
MCH: 31.2 pg (ref 26.0–34.0)
MCHC: 30.3 g/dL (ref 30.0–36.0)
MCV: 103 fL — ABNORMAL HIGH (ref 80.0–100.0)
Monocytes Absolute: 1.1 10*3/uL — ABNORMAL HIGH (ref 0.1–1.0)
Monocytes Relative: 5 %
Neutro Abs: 22.3 10*3/uL — ABNORMAL HIGH (ref 1.7–7.7)
Neutrophils Relative %: 89 %
Platelets: 294 10*3/uL (ref 150–400)
RBC: 4.01 MIL/uL — ABNORMAL LOW (ref 4.22–5.81)
RDW: 13.2 % (ref 11.5–15.5)
Smear Review: NORMAL
WBC: 25 10*3/uL — ABNORMAL HIGH (ref 4.0–10.5)
nRBC: 0 % (ref 0.0–0.2)

## 2019-10-24 LAB — RESPIRATORY PANEL BY RT PCR (FLU A&B, COVID)
Influenza A by PCR: NEGATIVE
Influenza B by PCR: NEGATIVE
SARS Coronavirus 2 by RT PCR: NEGATIVE

## 2019-10-24 LAB — URINE DRUG SCREEN, QUALITATIVE (ARMC ONLY)
Amphetamines, Ur Screen: POSITIVE — AB
Barbiturates, Ur Screen: NOT DETECTED
Benzodiazepine, Ur Scrn: NOT DETECTED
Cannabinoid 50 Ng, Ur ~~LOC~~: NOT DETECTED
Cocaine Metabolite,Ur ~~LOC~~: NOT DETECTED
MDMA (Ecstasy)Ur Screen: NOT DETECTED
Methadone Scn, Ur: NOT DETECTED
Opiate, Ur Screen: NOT DETECTED
Phencyclidine (PCP) Ur S: NOT DETECTED
Tricyclic, Ur Screen: NOT DETECTED

## 2019-10-24 LAB — HIV ANTIBODY (ROUTINE TESTING W REFLEX): HIV Screen 4th Generation wRfx: NONREACTIVE

## 2019-10-24 LAB — BETA-HYDROXYBUTYRIC ACID
Beta-Hydroxybutyric Acid: >8 mmol/L — ABNORMAL HIGH (ref 0.05–0.27)
Beta-Hydroxybutyric Acid: 4.9 mmol/L — ABNORMAL HIGH (ref 0.05–0.27)

## 2019-10-24 LAB — POC SARS CORONAVIRUS 2 AG: SARS Coronavirus 2 Ag: NEGATIVE

## 2019-10-24 MED ORDER — SODIUM BICARBONATE 8.4 % IV SOLN
50.0000 meq | Freq: Once | INTRAVENOUS | Status: AC
  Administered 2019-10-24: 50 meq via INTRAVENOUS
  Filled 2019-10-24: qty 50

## 2019-10-24 MED ORDER — LACTATED RINGERS IV BOLUS
1000.0000 mL | INTRAVENOUS | Status: AC
  Administered 2019-10-24 (×2): 1000 mL via INTRAVENOUS

## 2019-10-24 MED ORDER — INSULIN REGULAR(HUMAN) IN NACL 100-0.9 UT/100ML-% IV SOLN: INTRAVENOUS | Status: DC

## 2019-10-24 MED ORDER — INSULIN ASPART 100 UNIT/ML ~~LOC~~ SOLN
10.0000 [IU] | Freq: Once | SUBCUTANEOUS | Status: AC
  Administered 2019-10-24: 10 [IU] via INTRAVENOUS
  Filled 2019-10-24: qty 1

## 2019-10-24 MED ORDER — INSULIN REGULAR(HUMAN) IN NACL 100-0.9 UT/100ML-% IV SOLN
INTRAVENOUS | Status: DC
  Administered 2019-10-24: 4.6 [IU]/h via INTRAVENOUS
  Filled 2019-10-24: qty 100

## 2019-10-24 MED ORDER — CALCIUM GLUCONATE 10 % IV SOLN
INTRAVENOUS | Status: AC
  Administered 2019-10-24: 1 g via INTRAVENOUS
  Filled 2019-10-24: qty 10

## 2019-10-24 MED ORDER — ONDANSETRON HCL 4 MG/2ML IJ SOLN
4.0000 mg | Freq: Once | INTRAMUSCULAR | Status: AC
  Administered 2019-10-24: 14:00:00 4 mg via INTRAVENOUS
  Filled 2019-10-24: qty 2

## 2019-10-24 MED ORDER — DEXTROSE-NACL 5-0.45 % IV SOLN: INTRAVENOUS | Status: DC

## 2019-10-24 MED ORDER — ENOXAPARIN SODIUM 40 MG/0.4ML ~~LOC~~ SOLN
30.0000 mg | SUBCUTANEOUS | Status: DC
  Administered 2019-10-25 (×2): 30 mg via SUBCUTANEOUS
  Filled 2019-10-24 (×2): qty 0.4

## 2019-10-24 MED ORDER — LORAZEPAM 2 MG/ML IJ SOLN: 1.0000 mg | INTRAMUSCULAR | Status: DC | PRN

## 2019-10-24 MED ORDER — THIAMINE HCL 100 MG PO TABS
100.0000 mg | ORAL_TABLET | Freq: Every day | ORAL | Status: DC
  Administered 2019-10-25 – 2019-10-26 (×2): 100 mg via ORAL
  Filled 2019-10-24 (×3): qty 1

## 2019-10-24 MED ORDER — ADULT MULTIVITAMIN W/MINERALS CH
1.0000 | ORAL_TABLET | Freq: Every day | ORAL | Status: DC
  Administered 2019-10-24 – 2019-10-26 (×3): 1 via ORAL
  Filled 2019-10-24 (×3): qty 1

## 2019-10-24 MED ORDER — CALCIUM GLUCONATE 10 % IV SOLN
1.0000 g | Freq: Once | INTRAVENOUS | Status: AC
  Administered 2019-10-24: 1 g via INTRAVENOUS

## 2019-10-24 MED ORDER — LORAZEPAM 1 MG PO TABS: 1.0000 mg | ORAL_TABLET | ORAL | Status: DC | PRN

## 2019-10-24 MED ORDER — SODIUM CHLORIDE 0.9 % IV BOLUS
1000.0000 mL | Freq: Once | INTRAVENOUS | Status: AC
  Administered 2019-10-24: 1000 mL via INTRAVENOUS

## 2019-10-24 MED ORDER — SODIUM CHLORIDE 0.9 % IV SOLN: INTRAVENOUS | Status: DC

## 2019-10-24 MED ORDER — THIAMINE HCL 100 MG/ML IJ SOLN
100.0000 mg | Freq: Every day | INTRAMUSCULAR | Status: DC
  Administered 2019-10-24: 100 mg via INTRAVENOUS
  Filled 2019-10-24 (×2): qty 2

## 2019-10-24 MED ORDER — LABETALOL HCL 5 MG/ML IV SOLN: 10.0000 mg | INTRAVENOUS | Status: DC | PRN

## 2019-10-24 MED ORDER — FOLIC ACID 1 MG PO TABS
1.0000 mg | ORAL_TABLET | Freq: Every day | ORAL | Status: DC
  Administered 2019-10-24 – 2019-10-26 (×3): 1 mg via ORAL
  Filled 2019-10-24 (×3): qty 1

## 2019-10-24 MED ORDER — DEXTROSE 50 % IV SOLN: 0.0000 mL | INTRAVENOUS | Status: DC | PRN

## 2019-10-24 MED ORDER — ALBUTEROL SULFATE (2.5 MG/3ML) 0.083% IN NEBU
2.5000 mg | INHALATION_SOLUTION | Freq: Once | RESPIRATORY_TRACT | Status: AC
  Administered 2019-10-24: 2.5 mg via RESPIRATORY_TRACT
  Filled 2019-10-24: qty 3

## 2019-10-24 MED ORDER — NICOTINE 14 MG/24HR TD PT24
14.0000 mg | MEDICATED_PATCH | TRANSDERMAL | Status: DC
  Administered 2019-10-24: 14 mg via TRANSDERMAL
  Filled 2019-10-24 (×2): qty 1

## 2019-10-24 NOTE — ED Notes (Signed)
Pt denies taking ETOH today. Pt st "I do not drink".

## 2019-10-24 NOTE — H&P (Addendum)
History and Physical    Spencer HakimRoger A Mccathern ZOX:[id number] DOB: 04/07/1961 DOA: 10/24/2019  PCP: Sherron Mondayejan-Sie, S Ahmed, MD Patient coming from: HOME via EMS  I have personally briefly reviewed patient's old medical records in [facility]  Chief Complaint:   HPI: Spencer [name] is a 58 y.o. male with medical history significant of substance abuse and poorly controlled insulin-dependent diabetes.  He has recent admission for diabetic ketoacidosis in November 2020.  He was brought in by EMS same day.  Chief complaint altered mentation and lethargy.  No he is found to be in DKA with significantly elevated blood glucose greater than 1000, hyperkalemia with serum potassium greater than 7 (no EKG changes), altered mentation, Kussmaul respirations, markedly elevated anion gap.  My arrival in the emergency department he is unable to provide any history.  History is gained from speaking with the ED staff.  Patient is lethargic he does opens his eyes on prompting however has not provided reliable historical data.  ED Course: Patient was aggressively fluid resuscitated after presumptive diagnosis was made.  He is received a total of 3 L bolus of lactated Ringer's.  He was started on insulin infusion.  He was given an amp of calcium gluconate for the hyperkalemia as well as intravenous insulin.  Repeat potassium is pending at this time.  Per EMS report patient was relatively hypotensive on initial presentation however blood pressure responded appropriately and on my arrival systolics were in the 160s.  The patient will be placed on the medicine service and admitted to the stepdown unit for continued IV insulin therapy for diabetic ketoacidosis.  Review of Systems: As per HPI otherwise 10 point review of systems negative.    Past Medical History:  Diagnosis Date  . Brain aneurysm   . Broken bones    clavical, ankle, arms toes wriast   . Diabetes mellitus without complication (HCC)   . Dysrhythmia   . GERD  (gastroesophageal reflux disease)   . Hypertension   . Substance abuse [facility](HCC)     Past Surgical History:  Procedure Laterality Date  . CORONARY/GRAFT ACUTE MI REVASCULARIZATION N/A 09/04/2019   Procedure: Coronary/Graft Acute MI Revascularization;  Surgeon: Alwyn Peaallwood, Dwayne D, MD;  Location: [facility] INVASIVE CV LAB;  Service: Cardiovascular;  Laterality: N/A;  . ESOPHAGOGASTRODUODENOSCOPY (EGD) WITH PROPOFOL N/A 06/04/2019   Procedure: ESOPHAGOGASTRODUODENOSCOPY (EGD) WITH PROPOFOL;  Surgeon: Toledo, Boykin Nearingeodoro K, MD;  Location: [facility] ENDOSCOPY;  Service: Gastroenterology;  Laterality: N/A;  . HERNIA REPAIR    . IMPLANTATION / PLACEMENT OF STRIP ELECTRODES VIA BURR HOLES SUBDURAL     anyrusum  . LEFT HEART CATH AND CORONARY ANGIOGRAPHY N/A 09/04/2019   Procedure: LEFT HEART CATH AND CORONARY ANGIOGRAPHY;  Surgeon: Alwyn Peaallwood, Dwayne D, MD;  Location: [facility] INVASIVE CV LAB;  Service: Cardiovascular;  Laterality: N/A;  . NECK SURGERY    . TEE WITHOUT CARDIOVERSION N/A 06/03/2019   Procedure: TRANSESOPHAGEAL ECHOCARDIOGRAM (TEE);  Surgeon: Dalia HeadingFath, Kenneth A, MD;  Location: [facility] ORS;  Service: Cardiovascular;  Laterality: N/A;     reports that he has been smoking. He has never used smokeless tobacco. He reports current alcohol use of about 1.0 standard drinks of alcohol per week. He reports previous drug use. Drug: Cocaine.  No Known Allergies  Family History  Problem Relation Age of Onset  . CAD Sister   . Hypertension Sister   . Healthy Mother   . Healthy Father      Prior to Admission medications   Medication Sig Start  Date End Date Taking? Authorizing Provider  aspirin EC 81 MG tablet Take 1 tablet (81 mg total) by mouth daily. 08/21/19 03/08/20  Altamese Dilling, MD  insulin glargine (LANTUS) 100 unit/mL SOPN Inject 0.22 mLs (22 Units total) into the skin daily. 09/10/19   Danford, Earl Lites, MD  metFORMIN (GLUCOPHAGE) 1000 MG tablet Take 1,000 mg by mouth 2 (two) times daily with a  meal.    [provider]  Multiple Vitamin (MULTIVITAMIN WITH MINERALS) TABS tablet Take 1 tablet by mouth daily. 07/30/19   Enedina Finner, MD  nicotine (NICODERM CQ - DOSED IN MG/24 HOURS) 14 mg/24hr patch Place 1 patch (14 mg total) onto the skin daily. 09/11/19   Danford, Earl Lites, MD  NOVOLOG FLEXPEN 100 UNIT/ML FlexPen Inject 0-30 Units into the skin 3 (three) times daily. 09/10/19   Danford, Earl Lites, MD  pantoprazole (PROTONIX) 40 MG tablet Take 1 tablet (40 mg total) by mouth daily. 06/05/19 09/12/19  Ihor Austin, MD  sitaGLIPtin (JANUVIA) 50 MG tablet Take 1 tablet (50 mg total) by mouth daily. 09/15/19   Alberteen Sam, MD    Physical Exam: Vitals:   10/24/19 1332 10/24/19 1400 10/24/19 1500  BP: (!) 86/55 125/70 121/68  Pulse: 96 92 97  Resp: (!) 27 15 (!) 27  SpO2: 100% 100% 100%  Weight: 65.8 kg    Height: 6\' 1"  (1.854 m)      Constitutional: NAD, calm, comfortable Vitals:   10/24/19 1332 10/24/19 1400 10/24/19 1500  BP: (!) 86/55 125/70 121/68  Pulse: 96 92 97  Resp: (!) 27 15 (!) 27  SpO2: 100% 100% 100%  Weight: 65.8 kg    Height: 6\' 1"  (1.854 m)     Eyes: PERRL, lids and conjunctivae normal ENMT: Mucous membranes are dry. Posterior pharynx clear of any exudate or lesions. poor dentition.  Neck: normal, supple, no masses, no thyromegaly Respiratory: Kussmaul respirations noted, scattered crackles bilaterally  cardiovascular: Tachycardic, regular rhythm, no obvious murmurs  abdomen: Thin, positive bowel sounds  Skin: no rashes, lesions, ulcers. No induration Neurologic: Unable to fully assess due to mental status.  Psychiatric: Lethargic, altered  Labs on Admission: I have personally reviewed following labs and imaging studies  CBC: Recent Labs  Lab 10/24/19 1356  WBC 25.0*  NEUTROABS 22.3*  HGB 12.5*  HCT 41.3  MCV 103.0*  PLT 294   Basic Metabolic Panel: Recent Labs  Lab 10/24/19 1356  NA 134*  K 7.3*  CL 95*  CO2 <7*    GLUCOSE 1,013*  BUN 69*  CREATININE 3.06*  CALCIUM 8.8*   GFR: Estimated Creatinine Clearance: 23.9 mL/min (A) (by C-G formula based on SCr of 3.06 mg/dL (H)). Liver Function Tests: Recent Labs  Lab 10/24/19 1356  AST 16  ALT 30  ALKPHOS 113  BILITOT 2.2*  PROT 6.8  ALBUMIN 4.2   No results for input(s): LIPASE, AMYLASE in the last 168 hours. No results for input(s): AMMONIA in the last 168 hours. Coagulation Profile: No results for input(s): INR, PROTIME in the last 168 hours. Cardiac Enzymes: No results for input(s): CKTOTAL, CKMB, CKMBINDEX, TROPONINI in the last 168 hours. BNP (last 3 results) No results for input(s): PROBNP in the last 8760 hours. HbA1C: No results for input(s): HGBA1C in the last 72 hours. CBG: Recent Labs  Lab 10/24/19 1348  GLUCAP >600*   Lipid Profile: No results for input(s): CHOL, HDL, LDLCALC, TRIG, CHOLHDL, LDLDIRECT in the last 72 hours. Thyroid Function Tests: No results  for input(s): TSH, T4TOTAL, FREET4, T3FREE, THYROIDAB in the last 72 hours. Anemia Panel: No results for input(s): VITAMINB12, FOLATE, FERRITIN, TIBC, IRON, RETICCTPCT in the last 72 hours. Urine analysis:    Component Value Date/Time   COLORURINE YELLOW (A) 09/13/2019 0222   APPEARANCEUR CLEAR (A) 09/13/2019 0222   LABSPEC 1.015 09/13/2019 0222   PHURINE 5.0 09/13/2019 0222   GLUCOSEU >=500 (A) 09/13/2019 0222   HGBUR SMALL (A) 09/13/2019 0222   BILIRUBINUR NEGATIVE 09/13/2019 0222   KETONESUR 80 (A) 09/13/2019 0222   PROTEINUR 30 (A) 09/13/2019 0222   NITRITE NEGATIVE 09/13/2019 0222   LEUKOCYTESUR NEGATIVE 09/13/2019 0222    Radiological Exams on Admission: DG Chest Portable 1 View  Result Date: 10/24/2019 CLINICAL DATA:  Diabetic ketoacidosis with nausea and vomiting EXAM: PORTABLE CHEST 1 VIEW COMPARISON:  September 12, 2019 FINDINGS: No edema or consolidation. Heart size and pulmonary vascularity are normal. No adenopathy. There are metallic foreign  bodies in the right hemithorax region. There is evidence of an old healed rib fracture on the right. There is postoperative change in the lower cervical region. IMPRESSION: No edema or consolidation.  Stable cardiac silhouette. Electronically Signed   By: Lowella Grip III M.D.   On: 10/24/2019 14:40    EKG: Independently reviewed.  Tachycardic, sinus rhythm  Assessment/Plan Active Problems:   DKA (diabetic ketoacidoses) (HCC)   Diabetic ketoacidosis Admit to stepdown unit Start insulin infusion 3 L crystalloid bolus followed by normal saline infusion at 125 cc/h Every 4 hour base metabolic panel Every 8 hour beta hydroxybutyrate Change fluids to dextrose containing when sugars are less than 250 N.p.o. for now Antiemetic regimen Vital signs per unit protocol Currently no clear indication for ICU admission  Severe hyperkalemia Potassium 7.3 on admission No EKG changes Patient was given calcium gluconate 1 g x 1 in ED We will give an additional amp sodium bicarb x1 Nebulized albuterol x2 IV insulin and IV fluids initiated in the emergency department Will consider lowkelma   Acute kidney injury Likely dehydration in setting of diabetic ketoacidosis and sepsis. Aggressive IV fluid resuscitation Every 4 hour BMP Hold home lisinopril and Metformin  Polysubstance abuse Patient has a history of alcohol and cocaine abuse Appeared significantly altered on my evaluation Will start CIWA protocol Social service consult  Leukocytosis Likely leukemoid reaction in the setting of DKA Similar presentation to previous admission Follow CBC  currently no indication for antibiotics Unlikely secondary to sepsis   Hypertension Hold home lisinopril As needed IV labetalol  Tobacco Abuse Nicotine patch   DVT prophylaxis: lovenox Code Status: Assume FULL, need to re-visit with patient Family Communication: none Disposition Plan: home Consults called: none Admission status:  SDU   Sidney Ace MD [facility] Pager [phone]  If 7PM-7AM, please contact night-coverage [email or web address] Password [facility]  10/24/2019, 3:48 PM

## 2019-10-24 NOTE — ED Notes (Signed)
Posey alarm placed with Darl Householder RN at bedside.

## 2019-10-24 NOTE — ED Notes (Signed)
Pt attempeted to get out of bed. Pt A/Ox4. THis RN advised pt to stay in bed due the risk of falling. Pt belligerent this RN. This RN placed pt safely in bed and notified security and charge RN.

## 2019-10-24 NOTE — ED Notes (Signed)
First nurse note: Pt brought here via EMS with c/o vomiting, bg 390, 95%Ra, lethargic acting-slow to respond but oriented, possible DKA per EMS #[id number] FA with 500cc NS infusing in lobby at this time.

## 2019-10-24 NOTE — ED Notes (Signed)
Pt resting with eyes closed, responds to voice and follows commands, NAD noted, no concerns voiced at this time

## 2019-10-24 NOTE — ED Provider Notes (Signed)
[facility] Emergency Department Provider Note    First MD Initiated Contact with Patient 10/24/19 1342     (approximate)  I have reviewed the triage vital signs and the nursing notes.   HISTORY  Chief Complaint Diabetic Ketoacidosis  Level V Caveat:  AMS - dka  HPI Clydell HakimRoger A Gores is a 58 y.o. male extensive past medical history with frequent admissions to hospital for DKA presents the ER altered mental status nausea vomiting feeling unwell since last night.  States his last dose of insulin was last night.  Unable to provide much additional history.  Patient with kussmaul respirations.    Past Medical History:  Diagnosis Date  . Brain aneurysm   . Broken bones    clavical, ankle, arms toes wriast   . Diabetes mellitus without complication (HCC)   . Dysrhythmia   . GERD (gastroesophageal reflux disease)   . Hypertension   . Substance abuse (HCC)    Family History  Problem Relation Age of Onset  . CAD Sister   . Hypertension Sister   . Healthy Mother   . Healthy Father    Past Surgical History:  Procedure Laterality Date  . CORONARY/GRAFT ACUTE MI REVASCULARIZATION N/A 09/04/2019   Procedure: Coronary/Graft Acute MI Revascularization;  Surgeon: Alwyn Peaallwood, Dwayne D, MD;  Location: [facility] INVASIVE CV LAB;  Service: Cardiovascular;  Laterality: N/A;  . ESOPHAGOGASTRODUODENOSCOPY (EGD) WITH PROPOFOL N/A 06/04/2019   Procedure: ESOPHAGOGASTRODUODENOSCOPY (EGD) WITH PROPOFOL;  Surgeon: Toledo, Boykin Nearingeodoro K, MD;  Location: [facility] ENDOSCOPY;  Service: Gastroenterology;  Laterality: N/A;  . HERNIA REPAIR    . IMPLANTATION / PLACEMENT OF STRIP ELECTRODES VIA BURR HOLES SUBDURAL     anyrusum  . LEFT HEART CATH AND CORONARY ANGIOGRAPHY N/A 09/04/2019   Procedure: LEFT HEART CATH AND CORONARY ANGIOGRAPHY;  Surgeon: Alwyn Peaallwood, Dwayne D, MD;  Location: [facility] INVASIVE CV LAB;  Service: Cardiovascular;  Laterality: N/A;  . NECK SURGERY    . TEE WITHOUT CARDIOVERSION N/A  06/03/2019   Procedure: TRANSESOPHAGEAL ECHOCARDIOGRAM (TEE);  Surgeon: Dalia HeadingFath, Kenneth A, MD;  Location: [facility] ORS;  Service: Cardiovascular;  Laterality: N/A;   Patient Active Problem List   Diagnosis Date Noted  . Tobacco abuse counseling   . Atrial fibrillation with RVR (HCC) 08/07/2019  . GERD (gastroesophageal reflux disease) 08/07/2019  . Acute renal failure (HCC)   . Hyperkalemia   . Sepsis (HCC) 06/09/2019  . AKI (acute kidney injury) (HCC) 02/19/2019  . Hypoglycemia 10/23/2018  . Chronic low back pain 01/16/2017  . Chronic neck pain 01/16/2017  . Diabetic neuropathy (HCC) 01/16/2017  . DM2 (diabetes mellitus, type 2) (HCC) 01/16/2017  . History of cocaine abuse (HCC) 01/16/2017  . Personal history of subdural hematoma 01/16/2017  . Closed displaced fracture of body of left calcaneus with delayed healing 12/13/2016  . Protein-calorie malnutrition, severe 11/08/2016  . DKA, type 2 (HCC) 11/06/2016  . DKA (diabetic ketoacidoses) (HCC) 04/03/2015  . Hypertension 04/03/2015  . Depression 04/03/2015  . Opiate dependence (HCC) 04/03/2015  . Tobacco abuse 04/03/2015  . Lumbosacral neuritis 07/19/2014  . Pain of finger of right hand 07/19/2014  . Type II or unspecified type diabetes mellitus without mention of complication, not stated as uncontrolled 07/19/2014  . Knee pain 09/12/2013  . Hernia of flank 09/20/2012  . Epidermoid cyst of skin 09/20/2012  . Chronic pain of both shoulders 03/27/2012      Prior to Admission medications   Medication Sig Start Date End Date Taking? Authorizing Provider  aspirin EC 81 MG tablet Take 1 tablet (81 mg total) by mouth daily. 08/21/19 03/08/20  Vaughan Basta, MD  insulin glargine (LANTUS) 100 unit/mL SOPN Inject 0.22 mLs (22 Units total) into the skin daily. 09/10/19   Danford, Suann Larry, MD  metFORMIN (GLUCOPHAGE) 1000 MG tablet Take 1,000 mg by mouth 2 (two) times daily with a meal.    [provider]  Multiple Vitamin  (MULTIVITAMIN WITH MINERALS) TABS tablet Take 1 tablet by mouth daily. 07/30/19   Fritzi Mandes, MD  nicotine (NICODERM CQ - DOSED IN MG/24 HOURS) 14 mg/24hr patch Place 1 patch (14 mg total) onto the skin daily. 09/11/19   Danford, Suann Larry, MD  NOVOLOG FLEXPEN 100 UNIT/ML FlexPen Inject 0-30 Units into the skin 3 (three) times daily. 09/10/19   Danford, Suann Larry, MD  pantoprazole (PROTONIX) 40 MG tablet Take 1 tablet (40 mg total) by mouth daily. 06/05/19 09/12/19  Saundra Shelling, MD  sitaGLIPtin (JANUVIA) 50 MG tablet Take 1 tablet (50 mg total) by mouth daily. 09/15/19   Danford, Suann Larry, MD    Allergies Patient has no known allergies.    Social History Social History   Tobacco Use  . Smoking status: Current Some Day Smoker  . Smokeless tobacco: Never Used  Substance Use Topics  . Alcohol use: Yes    Alcohol/week: 1.0 standard drinks    Types: 1 Cans of beer per week  . Drug use: Not Currently    Types: Cocaine    Review of Systems Patient denies headaches, rhinorrhea, blurry vision, numbness, shortness of breath, chest pain, edema, cough, abdominal pain, nausea, vomiting, diarrhea, dysuria, fevers, rashes or hallucinations unless otherwise stated above in HPI. ____________________________________________   PHYSICAL EXAM:  VITAL SIGNS: Vitals:   10/24/19 1400 10/24/19 1500  BP: 125/70 121/68  Pulse: 92 97  Resp: 15 (!) 27  SpO2: 100% 100%    Constitutional: Alert but drowsy, poor historian, appears ill Eyes: Conjunctivae are normal.  Head: Atraumatic. Nose: No congestion/rhinnorhea. Mouth/Throat: Mucous membranes are moist.   Neck: No stridor. Painless ROM.  Cardiovascular: tachycardic rate, regular rhythm. Grossly normal heart sounds.  Good peripheral circulation. Respiratory: kussmaul respirations, tachypnea Gastrointestinal: Soft and nontender. No distention. No abdominal bruits. No CVA tenderness. Genitourinary:  Musculoskeletal: No lower extremity  tenderness nor edema.  No joint effusions. Neurologic:  Normal speech and language. No gross focal neurologic deficits are appreciated. No facial droop Skin:  Skin is warm, dry and intact. No rash noted. Psychiatric: Mood and affect are normal. Speech and behavior are normal.  ____________________________________________   LABS (all labs ordered are listed, but only abnormal results are displayed)  Results for orders placed or performed during the hospital encounter of 10/24/19 (from the past 24 hour(s))  Glucose, capillary     Status: Abnormal   Collection Time: 10/24/19  1:48 PM  Result Value Ref Range   Glucose-Capillary >600 (HH) 70 - 99 mg/dL  CBC with Differential     Status: Abnormal   Collection Time: 10/24/19  1:56 PM  Result Value Ref Range   WBC 25.0 (H) 4.0 - 10.5 K/uL   RBC 4.01 (L) 4.22 - 5.81 MIL/uL   Hemoglobin 12.5 (L) 13.0 - 17.0 g/dL   HCT 41.3 39.0 - 52.0 %   MCV 103.0 (H) 80.0 - 100.0 fL   MCH 31.2 26.0 - 34.0 pg   MCHC 30.3 30.0 - 36.0 g/dL   RDW 13.2 11.5 - 15.5 %   Platelets 294 150 -  400 K/uL   nRBC 0.0 0.0 - 0.2 %   Neutrophils Relative % 89 %   Neutro Abs 22.3 (H) 1.7 - 7.7 K/uL   Lymphocytes Relative 5 %   Lymphs Abs 1.3 0.7 - 4.0 K/uL   Monocytes Relative 5 %   Monocytes Absolute 1.1 (H) 0.1 - 1.0 K/uL   Eosinophils Relative 0 %   Eosinophils Absolute 0.0 0.0 - 0.5 K/uL   Basophils Relative 0 %   Basophils Absolute 0.1 0.0 - 0.1 K/uL   WBC Morphology MORPHOLOGY UNREMARKABLE    RBC Morphology MORPHOLOGY UNREMARKABLE    Smear Review Normal platelet morphology    Immature Granulocytes 1 %   Abs Immature Granulocytes 0.23 (H) 0.00 - 0.07 K/uL  Comprehensive metabolic panel     Status: Abnormal   Collection Time: 10/24/19  1:56 PM  Result Value Ref Range   Sodium 134 (L) 135 - 145 mmol/L   Potassium 7.3 (HH) 3.5 - 5.1 mmol/L   Chloride 95 (L) 98 - 111 mmol/L   CO2 <7 (L) 22 - 32 mmol/L   Glucose, Bld 1,013 (HH) 70 - 99 mg/dL   BUN 69 (H) 6 -  20 mg/dL   Creatinine, Ser 1.61 (H) 0.61 - 1.24 mg/dL   Calcium 8.8 (L) 8.9 - 10.3 mg/dL   Total Protein 6.8 6.5 - 8.1 g/dL   Albumin 4.2 3.5 - 5.0 g/dL   AST 16 15 - 41 U/L   ALT 30 0 - 44 U/L   Alkaline Phosphatase 113 38 - 126 U/L   Total Bilirubin 2.2 (H) 0.3 - 1.2 mg/dL   GFR calc non Af Amer 21 (L) >60 mL/min   GFR calc Af Amer 24 (L) >60 mL/min   Anion gap NOT CALCULATED 5 - 15  POC SARS Coronavirus 2 Ag     Status: None   Collection Time: 10/24/19  2:58 PM  Result Value Ref Range   SARS Coronavirus 2 Ag NEGATIVE NEGATIVE   ____________________________________________  EKG My review and personal interpretation at Time: 14:19   Indication: N/V  Rate: 90  Rhythm: sinus Axis: normal Other: normal intervals, hyperacute t waves ____________________________________________  RADIOLOGY  I personally reviewed all radiographic images ordered to evaluate for the above acute complaints and reviewed radiology reports and findings.  These findings were personally discussed with the patient.  Please see medical record for radiology report.  ____________________________________________   PROCEDURES  Procedure(s) performed:  .Critical Care Performed by: Willy Eddy, MD Authorized by: Willy Eddy, MD   Critical care provider statement:    Critical care time (minutes):  30   Critical care time was exclusive of:  Separately billable procedures and treating other patients   Critical care was necessary to treat or prevent imminent or life-threatening deterioration of the following conditions:  Metabolic crisis   Critical care was time spent personally by me on the following activities:  Development of treatment plan with patient or surrogate, discussions with consultants, evaluation of patient's response to treatment, examination of patient, obtaining history from patient or surrogate, ordering and performing treatments and interventions, ordering and review of laboratory  studies, ordering and review of radiographic studies, pulse oximetry, re-evaluation of patient's condition and review of old charts      Critical Care performed: yes ____________________________________________   INITIAL IMPRESSION / ASSESSMENT AND PLAN / ED COURSE  Pertinent labs & imaging results that were available during my care of the patient were reviewed by me and considered in my  medical decision making (see chart for details).   DDX: DKA, HH NS, dehydration, lecture light normality, sepsis, AKI  FAIZ WEBER is a 58 y.o. who presents to the ED with symptoms as described above.  Patient with evidence of high anion gap metabolic acidosis with significant hyperglycemia.  Given aggressive IV fluid resuscitation. The patient will be placed on continuous pulse oximetry and telemetry for monitoring.  Laboratory evaluation will be sent to evaluate for the above complaints.     Clinical Course as of Oct 23 1528  Fri Oct 24, 2019  1511 Patient with critically elevated glucose as well as AKI, hyperkalemia high anion gap metabolic acidosis.  Patient critically ill.  Will be admitted to the hospital for further medical management.   [PR]    Clinical Course User Index [PR] Willy Eddy, MD    The patient was evaluated in Emergency Department today for the symptoms described in the history of present illness. He/she was evaluated in the context of the global COVID-19 pandemic, which necessitated consideration that the patient might be at risk for infection with the SARS-CoV-2 virus that causes COVID-19. Institutional protocols and algorithms that pertain to the evaluation of patients at risk for COVID-19 are in a state of rapid change based on information released by regulatory bodies including the CDC and federal and state organizations. These policies and algorithms were followed during the patient's care in the ED.  As part of my medical decision making, I reviewed the following data  within the electronic MEDICAL RECORD NUMBER Nursing notes reviewed and incorporated, Labs reviewed, notes from prior ED visits and [place] Controlled Substance Database   ____________________________________________   FINAL CLINICAL IMPRESSION(S) / ED DIAGNOSES  Final diagnoses:  Diabetic ketoacidosis with coma associated with other specified diabetes mellitus (HCC)      NEW MEDICATIONS STARTED DURING THIS VISIT:  New Prescriptions   No medications on file     Note:  This document was prepared using Dragon voice recognition software and may include unintentional dictation errors.    Willy Eddy, MD 10/24/19 [phone]

## 2019-10-24 NOTE — ED Triage Notes (Signed)
Pt here for vomiting. Reports about 5-6 episodes today. Is diabetic.  Kussmaul breathing noted. Drowsy. Does not appear well.  Hypotensive.

## 2019-10-25 DIAGNOSIS — E1110 Type 2 diabetes mellitus with ketoacidosis without coma: Principal | ICD-10-CM

## 2019-10-25 DIAGNOSIS — F1910 Other psychoactive substance abuse, uncomplicated: Secondary | ICD-10-CM

## 2019-10-25 DIAGNOSIS — E1139 Type 2 diabetes mellitus with other diabetic ophthalmic complication: Secondary | ICD-10-CM

## 2019-10-25 DIAGNOSIS — E1165 Type 2 diabetes mellitus with hyperglycemia: Secondary | ICD-10-CM

## 2019-10-25 LAB — BASIC METABOLIC PANEL
Anion gap: 7 (ref 5–15)
Anion gap: 7 (ref 5–15)
BUN: 48 mg/dL — ABNORMAL HIGH (ref 6–20)
BUN: 41 mg/dL — ABNORMAL HIGH (ref 6–20)
CO2: 24 mmol/L (ref 22–32)
CO2: 26 mmol/L (ref 22–32)
Calcium: 9.1 mg/dL (ref 8.9–10.3)
Calcium: 9.7 mg/dL (ref 8.9–10.3)
Chloride: 116 mmol/L — ABNORMAL HIGH (ref 98–111)
Chloride: 114 mmol/L — ABNORMAL HIGH (ref 98–111)
Creatinine, Ser: 1.56 mg/dL — ABNORMAL HIGH (ref 0.61–1.24)
Creatinine, Ser: 1.07 mg/dL (ref 0.61–1.24)
GFR calc Af Amer: 55 mL/min — ABNORMAL LOW (ref >60)
GFR calc Af Amer: >60 mL/min (ref >60)
GFR calc non Af Amer: 48 mL/min — ABNORMAL LOW (ref >60)
GFR calc non Af Amer: >60 mL/min (ref >60)
Glucose, Bld: 217 mg/dL — ABNORMAL HIGH (ref 70–99)
Glucose, Bld: 151 mg/dL — ABNORMAL HIGH (ref 70–99)
Potassium: 4.1 mmol/L (ref 3.5–5.1)
Potassium: 3.6 mmol/L (ref 3.5–5.1)
Sodium: 147 mmol/L — ABNORMAL HIGH (ref 135–145)
Sodium: 147 mmol/L — ABNORMAL HIGH (ref 135–145)

## 2019-10-25 LAB — GLUCOSE, CAPILLARY
Glucose-Capillary: 129 mg/dL — ABNORMAL HIGH (ref 70–99)
Glucose-Capillary: 144 mg/dL — ABNORMAL HIGH (ref 70–99)
Glucose-Capillary: 148 mg/dL — ABNORMAL HIGH (ref 70–99)
Glucose-Capillary: 149 mg/dL — ABNORMAL HIGH (ref 70–99)
Glucose-Capillary: 151 mg/dL — ABNORMAL HIGH (ref 70–99)
Glucose-Capillary: 161 mg/dL — ABNORMAL HIGH (ref 70–99)
Glucose-Capillary: 167 mg/dL — ABNORMAL HIGH (ref 70–99)
Glucose-Capillary: 169 mg/dL — ABNORMAL HIGH (ref 70–99)
Glucose-Capillary: 174 mg/dL — ABNORMAL HIGH (ref 70–99)
Glucose-Capillary: 186 mg/dL — ABNORMAL HIGH (ref 70–99)
Glucose-Capillary: 187 mg/dL — ABNORMAL HIGH (ref 70–99)
Glucose-Capillary: 235 mg/dL — ABNORMAL HIGH (ref 70–99)
Glucose-Capillary: 268 mg/dL — ABNORMAL HIGH (ref 70–99)

## 2019-10-25 LAB — BETA-HYDROXYBUTYRIC ACID: Beta-Hydroxybutyric Acid: 3.19 mmol/L — ABNORMAL HIGH (ref 0.05–0.27)

## 2019-10-25 LAB — MAGNESIUM: Magnesium: 2.3 mg/dL (ref 1.7–2.4)

## 2019-10-25 MED ORDER — INSULIN ASPART 100 UNIT/ML ~~LOC~~ SOLN
4.0000 [IU] | Freq: Three times a day (TID) | SUBCUTANEOUS | Status: DC
  Administered 2019-10-26 (×2): 4 [IU] via SUBCUTANEOUS
  Filled 2019-10-25 (×3): qty 1

## 2019-10-25 MED ORDER — INSULIN ASPART 100 UNIT/ML ~~LOC~~ SOLN
0.0000 [IU] | Freq: Three times a day (TID) | SUBCUTANEOUS | Status: DC
  Administered 2019-10-25: 17:00:00 5 [IU] via SUBCUTANEOUS
  Administered 2019-10-25 – 2019-10-26 (×4): 3 [IU] via SUBCUTANEOUS
  Filled 2019-10-25 (×5): qty 1

## 2019-10-25 MED ORDER — INSULIN GLARGINE 100 UNIT/ML ~~LOC~~ SOLN
20.0000 [IU] | SUBCUTANEOUS | Status: DC
  Administered 2019-10-25 – 2019-10-26 (×2): 20 [IU] via SUBCUTANEOUS
  Filled 2019-10-25 (×3): qty 0.2

## 2019-10-25 MED ORDER — INSULIN ASPART 100 UNIT/ML ~~LOC~~ SOLN
0.0000 [IU] | Freq: Every day | SUBCUTANEOUS | Status: DC
  Administered 2019-10-25: 3 [IU] via SUBCUTANEOUS
  Filled 2019-10-25: qty 1

## 2019-10-25 NOTE — Progress Notes (Signed)
Inpatient Diabetes Program Recommendations  AACE/ADA: New Consensus Statement on Inpatient Glycemic Control (2015)  Target Ranges:  Prepandial:   less than 140 mg/dL      Peak postprandial:   less than 180 mg/dL (1-2 hours)      Critically ill patients:  140 - 180 mg/dL   Results for BRANTON, EINSTEIN (MRN [id number]) as of 10/25/2019 10:50  Ref. Range 10/24/2019 13:56  Sodium Latest Ref Range: 135 - 145 mmol/L 134 (L)  Potassium Latest Ref Range: 3.5 - 5.1 mmol/L 7.3 (HH)  Chloride Latest Ref Range: 98 - 111 mmol/L 95 (L)  CO2 Latest Ref Range: 22 - 32 mmol/L <7 (L)  Glucose Latest Ref Range: 70 - 99 mg/dL 1,013 (HH)  BUN Latest Ref Range: 6 - 20 mg/dL 69 (H)  Creatinine Latest Ref Range: 0.61 - 1.24 mg/dL 3.06 (H)  Calcium Latest Ref Range: 8.9 - 10.3 mg/dL 8.8 (L)  Anion gap Latest Ref Range: 5 - 15  NOT CALCULATED   Results for XERXES, AGRUSA (MRN [id number]) as of 10/25/2019 10:50  Ref. Range 10/25/2019 04:37 10/25/2019 06:03 10/25/2019 07:36 10/25/2019 08:49 10/25/2019 10:41  Glucose-Capillary Latest Ref Range: 70 - 99 mg/dL 129 (H) 149 (H) 151 (H) 167 (H) 174 (H)   Results for FINN, AMOS (MRN [id number]) as of 10/25/2019 10:50  Ref. Range 05/30/2019 04:46 08/19/2019 02:03 09/12/2019 21:43  Hemoglobin A1C Latest Ref Range: 4.8 - 5.6 % 13.8 (H) 11.8 (H) 13.6 (H)    Admit with: DKA  History: DM, Substance Abuse  Home DM Meds: Lantus 22 units Daily       Novolog 0-30 units TID       Metformin 1000 BID       Januvia 50 mg daily  Current Orders: Lantus 20 units Daily      Novolog Moderate Correction Scale/ SSI (0-15 units) TID AC + HS      Novolog 4 units TID with meals         Transitioning to SQ Insulin this AM.  Orders placed for Lantus, Novolog SSI, and Novolog Meal Coverage this AM.     NOTE: Patient is well known to inpatient diabetes team. Patient was admitted 08/07/19, 08/19/19, 09/04/19, and 09/12/19.  Last seen by diabetes coordinator on 08/08/19. Recommend  adjusting outpatient Novolog to similar regimen used as an inpatient to help improve outpatient DM control and encourage patient to re-establish care with Endocrinologist.Noted patient cocaine positive(07/27/19, 08/07/19, 08/19/19, 09/04/19, and 09/13/19)whichis likelyeffecting patient's ability and willingness to manage DM.    --Will follow patient during hospitalization--  Wyn Quaker RN, MSN, CDE Diabetes Coordinator Inpatient Glycemic Control Team Team Pager: [phone] (8a-5p)

## 2019-10-25 NOTE — Progress Notes (Addendum)
PROGRESS NOTE    Spencer HakimRoger A Vath  ZHY:[id number] DOB: 12/02/1960 DOA: 10/24/2019 PCP: Sherron Mondayejan-Sie, S Ahmed, MD   Brief Narrative:  Spencer [name] is a 58 y.o. male with medical history significant of substance abuse and poorly controlled insulin-dependent diabetes.  He has recent admission for diabetic ketoacidosis in November 2020.  He was brought in by EMS same day.  Chief complaint altered mentation and lethargy.  No he is found to be in DKA with significantly elevated blood glucose greater than 1000, hyperkalemia with serum potassium greater than 7 (no EKG changes), altered mentation, Kussmaul respirations, markedly elevated anion gap.  My arrival in the emergency department he is unable to provide any history.  History is gained from speaking with the ED staff.  Patient is lethargic he does opens his eyes on prompting however has not provided reliable historical data.  12/19: Patient examined in the ICU.  Sleeping on my arrival.  Labs reassuring.  Anion gap closed.  Vital signs remained stable   Assessment & Plan:   Active Problems:   DKA (diabetic ketoacidoses) (HCC)    Diabetic ketoacidosis without coma resolved Poorly controlled insulin-dependent diabetes Acidosis resolved Anion gap closed Begin transition protocol Start Lantus 20 units daily 4 units NovoLog with meals Sliding scale coverage Every 12 hours BMP Accu-Cheks before meals and at bedtime Advance diet to carb control DC fluids once tolerating p.o.    Severe hyperkalemia( resolved) Potassium 7.3 on admission No EKG changes Patient was given calcium gluconate 1 g x 1 in ED We will give an additional amp sodium bicarb x1 Nebulized albuterol x2 IV insulin and IV fluids initiated in the emergency department Hyperkalemia resolved   Acute kidney injury (improved) Likely dehydration in setting of diabetic ketoacidosis and sepsis. Aggressive IV fluid resuscitation Every 12 hour BMP Hold home lisinopril and Metformin     Polysubstance abuse Patient has a history of alcohol and cocaine abuse Appeared significantly altered on my evaluation Will start CIWA protocol Social service consult   Leukocytosis Likely leukemoid reaction in the setting of DKA Similar presentation to previous admission Follow CBC  currently no indication for antibiotics Unlikely secondary to sepsis    Hypertension Hold home lisinopril As needed IV labetalol   Tobacco Abuse Nicotine patch   DVT prophylaxis: Lovenox Code Status: Full Family Communication: None  disposition Plan: Home, anticipate 24 hours   Consultants:  None  Procedures: None Antimicrobials: None  Subjective: Seen and examined Sleeping, Kussmaul respirations resolved Vital signs stable No complaints  Objective: Vitals:   10/25/19 1200 10/25/19 1230 10/25/19 1300 10/25/19 1330  BP: (!) 139/93 129/88 (!) 149/89 118/81  Pulse: 77 79 88 76  Resp: 15 16 18 16   Temp:      TempSrc:      SpO2: 98% 98% 99% 98%  Weight:      Height:        Intake/Output Summary (Last 24 hours) at 10/25/2019 1509 Last data filed at 10/25/2019 0453 Gross per 24 hour  Intake --  Output 2380 ml  Net -2380 ml   Filed Weights   10/24/19 1332  Weight: 65.8 kg    Examination:  Eyes: PERRL, lids and conjunctivae normal ENMT: Mucous membranes are dry. Posterior pharynx clear of any exudate or lesions. poor dentition.  Neck: normal, supple, no masses, no thyromegaly Respiratory: Kussmaul respirations noted, scattered crackles bilaterally  cardiovascular: Tachycardic, regular rhythm, no obvious murmurs  abdomen: Thin, positive bowel sounds  Skin: no rashes, lesions, ulcers. No  induration Neurologic: Unable to fully assess due to mental status.  Psychiatric: Lethargic, altered    Data Reviewed: I have personally reviewed following labs and imaging studies  CBC: Recent Labs  Lab 10/24/19 1356  WBC 25.0*  NEUTROABS 22.3*  HGB 12.5*  HCT 41.3  MCV  103.0*  PLT 294   Basic Metabolic Panel: Recent Labs  Lab 10/24/19 1356 10/24/19 1627 10/24/19 2157 10/25/19 0348  NA 134* 140 147* 147*  K 7.3* 5.3* 4.3 4.1  CL 95* 103 114* 116*  CO2 <7* <7* 15* 24  GLUCOSE 1,013* 715* 283* 217*  BUN 69* 65* 53* 48*  CREATININE 3.06* 2.91* 2.07* 1.56*  CALCIUM 8.8* 9.1 9.2 9.1  MG  --   --   --  2.3   GFR: Estimated Creatinine Clearance: 46.9 mL/min (A) (by C-G formula based on SCr of 1.56 mg/dL (H)). Liver Function Tests: Recent Labs  Lab 10/24/19 1356  AST 16  ALT 30  ALKPHOS 113  BILITOT 2.2*  PROT 6.8  ALBUMIN 4.2   No results for input(s): LIPASE, AMYLASE in the last 168 hours. No results for input(s): AMMONIA in the last 168 hours. Coagulation Profile: No results for input(s): INR, PROTIME in the last 168 hours. Cardiac Enzymes: No results for input(s): CKTOTAL, CKMB, CKMBINDEX, TROPONINI in the last 168 hours. BNP (last 3 results) No results for input(s): PROBNP in the last 8760 hours. HbA1C: No results for input(s): HGBA1C in the last 72 hours. CBG: Recent Labs  Lab 10/25/19 0603 10/25/19 0736 10/25/19 0849 10/25/19 1041 10/25/19 1249  GLUCAP 149* 151* 167* 174* 187*   Lipid Profile: No results for input(s): CHOL, HDL, LDLCALC, TRIG, CHOLHDL, LDLDIRECT in the last 72 hours. Thyroid Function Tests: No results for input(s): TSH, T4TOTAL, FREET4, T3FREE, THYROIDAB in the last 72 hours. Anemia Panel: No results for input(s): VITAMINB12, FOLATE, FERRITIN, TIBC, IRON, RETICCTPCT in the last 72 hours. Sepsis Labs: No results for input(s): PROCALCITON, LATICACIDVEN in the last 168 hours.  Recent Results (from the past 240 hour(s))  Respiratory Panel by RT PCR (Flu A&B, Covid) - Nasopharyngeal Swab     Status: None   Collection Time: 10/24/19  3:29 PM   Specimen: Nasopharyngeal Swab  Result Value Ref Range Status   SARS Coronavirus 2 by RT PCR NEGATIVE NEGATIVE Final    Comment: (NOTE) SARS-CoV-2 target nucleic  acids are NOT DETECTED. The SARS-CoV-2 RNA is generally detectable in upper respiratoy specimens during the acute phase of infection. The lowest concentration of SARS-CoV-2 viral copies this assay can detect is 131 copies/mL. A negative result does not preclude SARS-Cov-2 infection and should not be used as the sole basis for treatment or other patient management decisions. A negative result may occur with  improper specimen collection/handling, submission of specimen other than nasopharyngeal swab, presence of viral mutation(s) within the areas targeted by this assay, and inadequate number of viral copies (<131 copies/mL). A negative result must be combined with clinical observations, patient history, and epidemiological information. The expected result is Negative. Fact Sheet for Patients:  https://www.moore.com/ Fact Sheet for Healthcare Providers:  https://www.young.biz/ This test is not yet ap proved or cleared by the [place] FDA and  has been authorized for detection and/or diagnosis of SARS-CoV-2 by FDA under an Emergency Use Authorization (EUA). This EUA will remain  in effect (meaning this test can be used) for the duration of the COVID-19 declaration under Section 564(b)(1) of the Act, 21 U.S.C. section 360bbb-3(b)(1), unless the authorization is  terminated or revoked sooner.    Influenza A by PCR NEGATIVE NEGATIVE Final   Influenza B by PCR NEGATIVE NEGATIVE Final    Comment: (NOTE) The Xpert Xpress SARS-CoV-2/FLU/RSV assay is intended as an aid in  the diagnosis of influenza from Nasopharyngeal swab specimens and  should not be used as a sole basis for treatment. Nasal washings and  aspirates are unacceptable for Xpert Xpress SARS-CoV-2/FLU/RSV  testing. Fact Sheet for Patients: PinkCheek.be Fact Sheet for Healthcare Providers: GravelBags.it This test is not yet  approved or cleared by the [place] FDA and  has been authorized for detection and/or diagnosis of SARS-CoV-2 by  FDA under an Emergency Use Authorization (EUA). This EUA will remain  in effect (meaning this test can be used) for the duration of the  Covid-19 declaration under Section 564(b)(1) of the Act, 21  U.S.C. section 360bbb-3(b)(1), unless the authorization is  terminated or revoked. Performed at [facility], [address], [place], [place] [address]          Radiology Studies: DG Chest Portable 1 View  Result Date: 10/24/2019 CLINICAL DATA:  Diabetic ketoacidosis with nausea and vomiting EXAM: PORTABLE CHEST 1 VIEW COMPARISON:  September 12, 2019 FINDINGS: No edema or consolidation. Heart size and pulmonary vascularity are normal. No adenopathy. There are metallic foreign bodies in the right hemithorax region. There is evidence of an old healed rib fracture on the right. There is postoperative change in the lower cervical region. IMPRESSION: No edema or consolidation.  Stable cardiac silhouette. Electronically Signed   By: Lowella Grip III M.D.   On: 10/24/2019 14:40        Scheduled Meds:  enoxaparin (LOVENOX) injection  30 mg Subcutaneous H96Q   folic acid  1 mg Oral Daily   insulin aspart  0-15 Units Subcutaneous TID WC   insulin aspart  0-5 Units Subcutaneous QHS   insulin aspart  4 Units Subcutaneous TID WC   insulin glargine  20 Units Subcutaneous Q24H   multivitamin with minerals  1 tablet Oral Daily   nicotine  14 mg Transdermal Q24H   thiamine  100 mg Oral Daily   Or   thiamine  100 mg Intravenous Daily   Continuous Infusions:  sodium chloride Stopped (10/25/19 0109)   dextrose 5 % and 0.45% NaCl 75 mL/hr at 10/24/19 2329   insulin 2.2 Units/hr (10/25/19 1252)     LOS: 1 day    Time spent: 35 minutes    Sidney Ace, MD [facility] Pager [phone]  If 7PM-7AM, please contact  night-coverage [email or web address] Password [email or web address] 10/25/2019, 3:09 PM

## 2019-10-25 NOTE — ED Notes (Signed)
Report received - to be admitted once the floor can take report. Here with dka which has resolved.

## 2019-10-25 NOTE — ED Notes (Signed)
Report received from Jenna,RN

## 2019-10-25 NOTE — ED Notes (Signed)
Report given to Jenna RN

## 2019-10-26 ENCOUNTER — Other Ambulatory Visit: Payer: Self-pay

## 2019-10-26 ENCOUNTER — Encounter: Payer: Self-pay | Admitting: Internal Medicine

## 2019-10-26 DIAGNOSIS — F1920 Other psychoactive substance dependence, uncomplicated: Secondary | ICD-10-CM

## 2019-10-26 LAB — BASIC METABOLIC PANEL
Anion gap: 6 (ref 5–15)
BUN: 31 mg/dL — ABNORMAL HIGH (ref 6–20)
CO2: 25 mmol/L (ref 22–32)
Calcium: 9.1 mg/dL (ref 8.9–10.3)
Chloride: 113 mmol/L — ABNORMAL HIGH (ref 98–111)
Creatinine, Ser: 0.91 mg/dL (ref 0.61–1.24)
GFR calc Af Amer: >60 mL/min (ref >60)
GFR calc non Af Amer: >60 mL/min (ref >60)
Glucose, Bld: 191 mg/dL — ABNORMAL HIGH (ref 70–99)
Potassium: 3.6 mmol/L (ref 3.5–5.1)
Sodium: 144 mmol/L (ref 135–145)

## 2019-10-26 LAB — GLUCOSE, CAPILLARY
Glucose-Capillary: 195 mg/dL — ABNORMAL HIGH (ref 70–99)
Glucose-Capillary: 175 mg/dL — ABNORMAL HIGH (ref 70–99)

## 2019-10-26 MED ORDER — BLOOD GLUCOSE MONITOR KIT: PACK | 0 refills | Status: DC

## 2019-10-26 MED ORDER — SODIUM CHLORIDE 0.9% FLUSH
3.0000 mL | Freq: Two times a day (BID) | INTRAVENOUS | Status: DC
  Administered 2019-10-26: 3 mL via INTRAVENOUS

## 2019-10-26 MED ORDER — PANTOPRAZOLE SODIUM 40 MG PO TBEC: 40.0000 mg | DELAYED_RELEASE_TABLET | Freq: Every day | ORAL | 0 refills | Status: DC

## 2019-10-26 MED ORDER — ENOXAPARIN SODIUM 40 MG/0.4ML ~~LOC~~ SOLN: 40.0000 mg | SUBCUTANEOUS | Status: DC

## 2019-10-26 MED ORDER — ASPIRIN EC 81 MG PO TBEC: 81.0000 mg | DELAYED_RELEASE_TABLET | Freq: Every day | ORAL | 1 refills | Status: AC

## 2019-10-26 MED ORDER — ADULT MULTIVITAMIN W/MINERALS CH: 1.0000 | ORAL_TABLET | Freq: Every day | ORAL | 0 refills | Status: DC

## 2019-10-26 MED ORDER — SITAGLIPTIN PHOSPHATE 50 MG PO TABS: 50.0000 mg | ORAL_TABLET | Freq: Every day | ORAL | 0 refills | Status: DC

## 2019-10-26 MED ORDER — INSULIN GLARGINE 100 UNITS/ML SOLOSTAR PEN: 22.0000 [IU] | PEN_INJECTOR | Freq: Every day | SUBCUTANEOUS | 11 refills | Status: DC

## 2019-10-26 MED ORDER — NICOTINE 14 MG/24HR TD PT24: 14.0000 mg | MEDICATED_PATCH | Freq: Every day | TRANSDERMAL | 0 refills | Status: DC

## 2019-10-26 MED ORDER — METFORMIN HCL 1000 MG PO TABS: 1000.0000 mg | ORAL_TABLET | Freq: Two times a day (BID) | ORAL | 0 refills | Status: DC

## 2019-10-26 NOTE — Discharge Summary (Signed)
Physician Discharge Summary  Spencer [name] XIH:[id number] DOB: 1961-02-07 DOA: 10/24/2019  PCP: Jodi Marble, MD  Admit date: 10/24/2019 Discharge date: 10/26/2019  Admitted From: Home Disposition: Home  Recommendations for Outpatient Follow-up:  1. Follow up with PCP in 1-2 weeks 2. Please obtain BMP/CBC in one week   Home Health: No Equipment/Devices: No  Discharge Condition: Stable CODE STATUS: Full Diet recommendation:  Carb Modified  Brief/Interim Summary:  Spencer [name] a 58 y.o.malewith medical history significant ofsubstance abuse and poorly controlled insulin-dependent diabetes. He has recent admission for diabetic ketoacidosis in November 2020. He was brought in by EMS same day. Chief complaint altered mentation and lethargy. No he is found to be in DKA with significantly elevated blood glucose greater than 1000, hyperkalemia with serum potassium greater than 7 (no EKG changes), altered mentation, Kussmaul respirations, markedly elevated anion gap. My arrival in the emergency department he is unable to provide any history. History is gained from speaking with the ED staff. Patient is lethargic he does opens his eyes on prompting however has not provided reliable historical data.  12/19: Patient examined in the ICU.  Sleeping on my arrival.  Labs reassuring.  Anion gap closed.  Vital signs remained stable  12/20: Labs improved.  Electrolytes within normal limits.  Patient sitting up in bed eating.  In no visible distress.  Does not have recollection of my previous evaluation since admission.  Discharge Diagnoses:  Active Problems:   DKA (diabetic ketoacidoses) (HCC)  Diabetic ketoacidosis without coma (resolved) Poorly controlled insulin-dependent diabetes Started on insulin regimen IV via Endo tool protocol Aggressively fluid resuscitated Hyperkalemia treated multimodal approach Transition to subcutaneous regimen after resolution of acidosis Tolerated  transition well with no complications Tolerating p.o. intake on day of discharge Had lengthy conversation regarding the importance of substance abuse cessation as well as medication adherence Discharge patient home in stable condition Medication prescription sent to outpatient pharmacy Follow-up appointment with primary care physician arranged   Severe hyperkalemia( resolved) Potassium 7.3 on admission No EKG changes Patient was given calcium gluconate 1 g x 1 in ED We will give an additional amp sodium bicarb x1 Nebulized albuterol x2 IV insulin and IV fluids initiated in the emergency department Hyperkalemia resolved  Acute kidney injury (improved) Likely dehydration in setting of diabetic ketoacidosis and sepsis. Aggressive IV fluid resuscitation Every 12 hourBMP Hold home lisinopril and Metformin Kidney function at baseline at time of discharge Can resume home medication regimen  Polysubstance abuse Patient has a history of alcohol and cocaine abuse Appeared significantly altered on my evaluation Will start CIWA protocol, did not score Social service consult  Leukocytosis Likely leukemoid reaction in the setting of DKA Similar presentation to previous admission Follow CBC  currently no indication for antibiotics Sepsis ruled out  Hypertension Hold home lisinopril secondary to AKI Can resume on discharge  Tobacco Abuse Nicotine patch Counseled patient  Discharge Instructions  Discharge Instructions    Diet - low sodium heart healthy   Complete by: As directed    Increase activity slowly   Complete by: As directed      Allergies as of 10/26/2019   No Known Allergies     Medication List    TAKE these medications   aspirin EC 81 MG tablet Take 1 tablet (81 mg total) by mouth daily. Notes to patient: Monday morning   blood glucose meter kit and supplies Kit Dispense based on patient and insurance preference. Use up to four times daily as  directed. (FOR ICD-9 250.00, 250.01). Notes to patient: Before each meal, bedtime and as needed to check levels.   insulin glargine 100 unit/mL Sopn Commonly known as: LANTUS Inject 0.22 mLs (22 Units total) into the skin daily. Notes to patient: Monday morning   metFORMIN 1000 MG tablet Commonly known as: GLUCOPHAGE Take 1 tablet (1,000 mg total) by mouth 2 (two) times daily with a meal. Notes to patient: Monday morning with breakfast   multivitamin with minerals Tabs tablet Take 1 tablet by mouth daily. Notes to patient: Monday morning   nicotine 14 mg/24hr patch Commonly known as: NICODERM CQ - dosed in mg/24 hours Place 1 patch (14 mg total) onto the skin daily. Notes to patient: Monday morning   NovoLOG FlexPen 100 UNIT/ML FlexPen Generic drug: insulin aspart Inject 0-30 Units into the skin 3 (three) times daily. Notes to patient: Per sliding scale that you have- NEXT MEAL   pantoprazole 40 MG tablet Commonly known as: PROTONIX Take 1 tablet (40 mg total) by mouth daily. Notes to patient: Monday morning   sitaGLIPtin 50 MG tablet Commonly known as: JANUVIA Take 1 tablet (50 mg total) by mouth daily. Notes to patient: Monday morning      Follow-up Information    [facility] in 1 week(s).   Why: Voicemail left with clinic.  Patient should expect a call to schedule a primary care visit on 10/27/2019 Contact information: [place] [id number] [phone]          No Known Allergies  Consultations:  None   Procedures/Studies: DG Chest Portable 1 View  Result Date: 10/24/2019 CLINICAL DATA:  Diabetic ketoacidosis with nausea and vomiting EXAM: PORTABLE CHEST 1 VIEW COMPARISON:  September 12, 2019 FINDINGS: No edema or consolidation. Heart size and pulmonary vascularity are normal. No adenopathy. There are metallic foreign bodies in the right hemithorax region. There is evidence of an old healed rib fracture on the  right. There is postoperative change in the lower cervical region. IMPRESSION: No edema or consolidation.  Stable cardiac silhouette. Electronically Signed   By: Lowella Grip III M.D.   On: 10/24/2019 14:40     Subjective: Seen and examined on the day of discharge No acute events overnight Labs and electrolytes reassuring Patient sitting in bed eating Stable for discharge home  Discharge Exam: Vitals:   10/25/19 2028 10/26/19 0745  BP: 108/66 (!) 141/97  Pulse: 87 68  Resp: 16   Temp: 98.4 F (36.9 C) 98.2 F (36.8 C)  SpO2: 99% 99%   Vitals:   10/25/19 2023 10/25/19 2028 10/25/19 2042 10/26/19 0745  BP: (!) 137/93 108/66  (!) 141/97  Pulse: 70 87  68  Resp: 15 16    Temp: 97.7 F (36.5 C) 98.4 F (36.9 C)  98.2 F (36.8 C)  TempSrc:    Oral  SpO2: 99% 99%  99%  Weight:   65.7 kg   Height:   6' (1.829 m)     General: Pt is alert, awake, not in acute distress Cardiovascular: RRR, S1/S2 +, no rubs, no gallops Respiratory: CTA bilaterally, no wheezing, no rhonchi Abdominal: Soft, NT, ND, bowel sounds + Extremities: no edema, no cyanosis    The results of significant diagnostics from this hospitalization (including imaging, microbiology, ancillary and laboratory) are listed below for reference.     Microbiology: Recent Results (from the past 240 hour(s))  Respiratory Panel by RT PCR (Flu A&B, Covid) - Nasopharyngeal Swab  Status: None   Collection Time: 10/24/19  3:29 PM   Specimen: Nasopharyngeal Swab  Result Value Ref Range Status   SARS Coronavirus 2 by RT PCR NEGATIVE NEGATIVE Final    Comment: (NOTE) SARS-CoV-2 target nucleic acids are NOT DETECTED. The SARS-CoV-2 RNA is generally detectable in upper respiratoy specimens during the acute phase of infection. The lowest concentration of SARS-CoV-2 viral copies this assay can detect is 131 copies/mL. A negative result does not preclude SARS-Cov-2 infection and should not be used as the sole basis for  treatment or other patient management decisions. A negative result may occur with  improper specimen collection/handling, submission of specimen other than nasopharyngeal swab, presence of viral mutation(s) within the areas targeted by this assay, and inadequate number of viral copies (<131 copies/mL). A negative result must be combined with clinical observations, patient history, and epidemiological information. The expected result is Negative. Fact Sheet for Patients:  PinkCheek.be Fact Sheet for Healthcare Providers:  GravelBags.it This test is not yet ap proved or cleared by the [place] FDA and  has been authorized for detection and/or diagnosis of SARS-CoV-2 by FDA under an Emergency Use Authorization (EUA). This EUA will remain  in effect (meaning this test can be used) for the duration of the COVID-19 declaration under Section 564(b)(1) of the Act, 21 U.S.C. section 360bbb-3(b)(1), unless the authorization is terminated or revoked sooner.    Influenza A by PCR NEGATIVE NEGATIVE Final   Influenza B by PCR NEGATIVE NEGATIVE Final    Comment: (NOTE) The Xpert Xpress SARS-CoV-2/FLU/RSV assay is intended as an aid in  the diagnosis of influenza from Nasopharyngeal swab specimens and  should not be used as a sole basis for treatment. Nasal washings and  aspirates are unacceptable for Xpert Xpress SARS-CoV-2/FLU/RSV  testing. Fact Sheet for Patients: PinkCheek.be Fact Sheet for Healthcare Providers: GravelBags.it This test is not yet approved or cleared by the [place] FDA and  has been authorized for detection and/or diagnosis of SARS-CoV-2 by  FDA under an Emergency Use Authorization (EUA). This EUA will remain  in effect (meaning this test can be used) for the duration of the  Covid-19 declaration under Section 564(b)(1) of the Act, 21  U.S.C. section  360bbb-3(b)(1), unless the authorization is  terminated or revoked. Performed at [facility], [address] [place], [place] [address]      Labs: BNP (last 3 results) Recent Labs    07/27/19 1741 08/06/19 2252  BNP 122.0* 893.8*   Basic Metabolic Panel: Recent Labs  Lab 10/24/19 1627 10/24/19 2157 10/25/19 0348 10/25/19 1520 10/26/19 0334  NA 140 147* 147* 147* 144  K 5.3* 4.3 4.1 3.6 3.6  CL 103 114* 116* 114* 113*  CO2 <7* 15* _0 GLUCOSE 715* 283* 217* 151* 191*  BUN 65* 53* 48* 41* 31*  CREATININE 2.91* 2.07* 1.56* 1.07 0.91  CALCIUM 9.1 9.2 9.1 9.7 9.1  MG  --   --  2.3  --   --    Liver Function Tests: Recent Labs  Lab 10/24/19 1356  AST 16  ALT 30  ALKPHOS 113  BILITOT 2.2*  PROT 6.8  ALBUMIN 4.2   No results for input(s): LIPASE, AMYLASE in the last 168 hours. No results for input(s): AMMONIA in the last 168 hours. CBC: Recent Labs  Lab 10/24/19 1356  WBC 25.0*  NEUTROABS 22.3*  HGB 12.5*  HCT 41.3  MCV 103.0*  PLT 294   Cardiac Enzymes: No results  for input(s): CKTOTAL, CKMB, CKMBINDEX, TROPONINI in the last 168 hours. BNP: Invalid input(s): POCBNP CBG: Recent Labs  Lab 10/25/19 1633 10/25/19 1801 10/25/19 2236 10/26/19 0747 10/26/19 1137  GLUCAP 235* 169* 268* 195* 175*   D-Dimer No results for input(s): DDIMER in the last 72 hours. Hgb A1c No results for input(s): HGBA1C in the last 72 hours. Lipid Profile No results for input(s): CHOL, HDL, LDLCALC, TRIG, CHOLHDL, LDLDIRECT in the last 72 hours. Thyroid function studies No results for input(s): TSH, T4TOTAL, T3FREE, THYROIDAB in the last 72 hours.  Invalid input(s): FREET3 Anemia work up No results for input(s): VITAMINB12, FOLATE, FERRITIN, TIBC, IRON, RETICCTPCT in the last 72 hours. Urinalysis    Component Value Date/Time   COLORURINE STRAW (A) 10/24/2019 1658   APPEARANCEUR CLEAR (A) 10/24/2019 1658   LABSPEC 1.015 10/24/2019 1658   PHURINE 6.0  10/24/2019 1658   GLUCOSEU >=500 (A) 10/24/2019 1658   HGBUR SMALL (A) 10/24/2019 1658   BILIRUBINUR NEGATIVE 10/24/2019 1658   KETONESUR 80 (A) 10/24/2019 1658   PROTEINUR 30 (A) 10/24/2019 1658   NITRITE NEGATIVE 10/24/2019 1658   LEUKOCYTESUR NEGATIVE 10/24/2019 1658   Sepsis Labs Invalid input(s): PROCALCITONIN,  WBC,  LACTICIDVEN Microbiology Recent Results (from the past 240 hour(s))  Respiratory Panel by RT PCR (Flu A&B, Covid) - Nasopharyngeal Swab     Status: None   Collection Time: 10/24/19  3:29 PM   Specimen: Nasopharyngeal Swab  Result Value Ref Range Status   SARS Coronavirus 2 by RT PCR NEGATIVE NEGATIVE Final    Comment: (NOTE) SARS-CoV-2 target nucleic acids are NOT DETECTED. The SARS-CoV-2 RNA is generally detectable in upper respiratoy specimens during the acute phase of infection. The lowest concentration of SARS-CoV-2 viral copies this assay can detect is 131 copies/mL. A negative result does not preclude SARS-Cov-2 infection and should not be used as the sole basis for treatment or other patient management decisions. A negative result may occur with  improper specimen collection/handling, submission of specimen other than nasopharyngeal swab, presence of viral mutation(s) within the areas targeted by this assay, and inadequate number of viral copies (<131 copies/mL). A negative result must be combined with clinical observations, patient history, and epidemiological information. The expected result is Negative. Fact Sheet for Patients:  PinkCheek.be Fact Sheet for Healthcare Providers:  GravelBags.it This test is not yet ap proved or cleared by the [place] FDA and  has been authorized for detection and/or diagnosis of SARS-CoV-2 by FDA under an Emergency Use Authorization (EUA). This EUA will remain  in effect (meaning this test can be used) for the duration of the COVID-19 declaration under  Section 564(b)(1) of the Act, 21 U.S.C. section 360bbb-3(b)(1), unless the authorization is terminated or revoked sooner.    Influenza A by PCR NEGATIVE NEGATIVE Final   Influenza B by PCR NEGATIVE NEGATIVE Final    Comment: (NOTE) The Xpert Xpress SARS-CoV-2/FLU/RSV assay is intended as an aid in  the diagnosis of influenza from Nasopharyngeal swab specimens and  should not be used as a sole basis for treatment. Nasal washings and  aspirates are unacceptable for Xpert Xpress SARS-CoV-2/FLU/RSV  testing. Fact Sheet for Patients: PinkCheek.be Fact Sheet for Healthcare Providers: GravelBags.it This test is not yet approved or cleared by the [place] FDA and  has been authorized for detection and/or diagnosis of SARS-CoV-2 by  FDA under an Emergency Use Authorization (EUA). This EUA will remain  in effect (meaning this test can be used) for the duration of  the  Covid-19 declaration under Section 564(b)(1) of the Act, 21  U.S.C. section 360bbb-3(b)(1), unless the authorization is  terminated or revoked. Performed at [facility], [address], [place], [place] [address]      Time coordinating discharge: Over 30 minutes  SIGNED:   Sidney Ace, MD  [facility] 10/26/2019, 2:55 PM Pager [phone]  If 7PM-7AM, please contact night-coverage [email or web address] Password [email or web address]

## 2019-10-26 NOTE — Discharge Instructions (Signed)
Diabetic Ketoacidosis °Diabetic ketoacidosis is a serious complication of diabetes. This condition develops when there is not enough insulin in the body. Insulin is an hormone that regulates blood sugar levels in the body. Normally, insulin allows glucose to enter the cells in the body. The cells break down glucose for energy. Without enough insulin, the body cannot break down glucose, so it breaks down fats instead. This leads to high blood glucose levels in the body and the production of acids that are called ketones. Ketones are poisonous at high levels. °If diabetic ketoacidosis is not treated, it can cause severe dehydration and can lead to a coma or death. °What are the causes? °This condition develops when a lack of insulin causes the body to break down fats instead of glucose. This may be triggered by: °· Stress on the body. This stress is brought on by an illness. °· Infection. °· Medicines that raise blood glucose levels. °· Not taking diabetes medicine. °· New onset of type 1 diabetes mellitus. °What are the signs or symptoms? °Symptoms of this condition include: °· Fatigue. °· Weight loss. °· Excessive thirst. °· Light-headedness. °· Fruity or sweet-smelling breath. °· Excessive urination. °· Vision changes. °· Confusion or irritability. °· Nausea. °· Vomiting. °· Rapid breathing. °· Abdominal pain. °· Feeling flushed. °How is this diagnosed? °This condition is diagnosed based on your medical history, a physical exam, and blood tests. You may also have a urine test to check for ketones. °How is this treated? °This condition may be treated with: °· Fluid replacement. This may be done to correct dehydration. °· Insulin injections. These may be given through the skin or through an IV tube. °· Electrolyte replacement. Electrolytes are minerals in your blood. Electrolytes such as potassium and sodium may be given in pill form or through an IV tube. °· Antibiotic medicines. These may be prescribed if your  condition was caused by an infection. °Diabetic ketoacidosis is a serious medical condition. You may need emergency treatment in the hospital to monitor your condition. °Follow these instructions at home: °Eating and drinking °· Drink enough fluids to keep your urine clear or pale yellow. °· If you are not able to eat, drink clear fluids in small amounts as you are able. Clear fluids include water, ice chips, fruit juice with water added (diluted), and low-calorie sports drinks. You may also have sugar-free jello or popsicles. °· If you are able to eat, follow your usual diet and drink sugar-free liquids, such as water. °Medicines °· Take over-the-counter and prescription medicines only as told by your health care provider. °· Continue to take insulin and other diabetes medicines as told by your health care provider. °· If you were prescribed an antibiotic, take it as told by your health care provider. Do not stop taking the antibiotic even if you start to feel better. °General instructions ° °· Check your urine for ketones when you are ill and as told by your health care provider. °? If your blood glucose is 240 mg/dL (13.3 mmol/L) or higher, check your urine ketones every 4-6 hours. °· Check your blood glucose every day, as often as told by your health care provider. °? If your blood glucose is high, drink plenty of fluids. This helps to flush out ketones. °? If your blood glucose is above your target for 2 tests in a row, contact your health care provider. °· Carry a medical alert card or wear medical alert jewelry that says that you have diabetes. °· Rest   and exercise only as told by your health care provider. Do not exercise when your blood glucose is high and you have ketones in your urine. °· If you get sick, call your health care provider and begin treatment quickly. Your body often needs extra insulin to fight an illness. Check your blood glucose every 4-6 hours when you are sick. °· Keep all follow-up  visits as told by your health care provider. This is important. °Contact a health care provider if: °· Your blood glucose level is higher than 240 mg/dL (13.3 mmol/L) for 2 days in a row. °· You have moderate or large ketones in your urine. °· You have a fever. °· You cannot eat or drink without vomiting. °· You have been vomiting for more than 2 hours. °· You continue to have symptoms of diabetic ketoacidosis. °· You develop new symptoms. °Get help right away if: °· Your blood glucose monitor reads “high” even when you are taking insulin. °· You faint. °· You have chest pain. °· You have trouble breathing. °· You have sudden trouble speaking or swallowing. °· You have vomiting or diarrhea that gets worse after 3 hours. °· You are unable to stay awake. °· You have trouble thinking. °· You are severely dehydrated. Symptoms of severe dehydration include: °? Extreme thirst. °? Dry mouth. °? Rapid breathing. °These symptoms may represent a serious problem that is an emergency. Do not wait to see if the symptoms will go away. Get medical help right away. Call your local emergency services (911 in the [place] Do not drive yourself to the hospital. °Summary °· Diabetic ketoacidosis is a serious complication of diabetes. This condition develops when there is not enough insulin in the body. °· This condition is diagnosed based on your medical history, a physical exam, and blood tests. You may also have a urine test to check for ketones. °· Diabetic ketoacidosis is a serious medical condition. You may need emergency treatment in the hospital to monitor your condition. °· Contact your health care provider if your blood glucose is higher than 240 mg/dl for 2 days in a row or if you have moderate or large ketones in your urine. °This information is not intended to replace advice given to you by your health care provider. Make sure you discuss any questions you have with your health care provider. °Document Released: 10/20/2000  Document Revised: 12/08/2016 Document Reviewed: 11/27/2016 °Elsevier Patient Education © 2020 [employer] Inc. ° °

## 2019-10-26 NOTE — Plan of Care (Signed)
  Problem: Education: Goal: Knowledge of General Education information will improve Description: Including pain rating scale, medication(s)/side effects and non-pharmacologic comfort measures Outcome: Completed/Met

## 2019-10-26 NOTE — TOC Progression Note (Signed)
Transition of Care [facility]) - Progression Note    Patient Details  Name: Spencer [name] MRN: [id number] Date of Birth: Jul 21, 1961  [facility] CM/SW Contact  Truitt Merle, LCSW Phone Number: 10/26/2019, 1:08 PM  Clinical Narrative:    Patient here from home and ready for discharge today. Patient states he lives with his father, siblings, and nephew. Patient to be transported home by his nephew. Patient states he can afford his medication, as they are $3 each after Medicaid. Patient states nurse already spoke with him about PCP follow-up. Patient states he does not need any resources at this time and has no other needs or questions. RN Opal Sidles updated. LCSW signing off as no further needs identified.    Expected Discharge Plan: Home/Self Care Barriers to Discharge: No Barriers Identified  Expected Discharge Plan and Services Expected Discharge Plan: Home/Self Care In-house Referral: Clinical Social Work     Living arrangements for the past 2 months: Single Family Home Expected Discharge Date: 10/26/19                                     Social Determinants of Health (SDOH) Interventions    Readmission Risk Interventions Readmission Risk Prevention Plan 09/15/2019 09/05/2019 08/21/2019  Transportation Screening Complete - Complete  PCP or Specialist Appt within 3-5 Days - - -  HRI or [place] or Home Care Consult comments - - -  Palliative Care Screening - - -  Medication Review (RN Care Manager) Complete - Complete  PCP or Specialist appointment within 3-5 days of discharge (No Data) Complete -  HRI or Home Care Consult Patient refused - -  SW Recovery Care/Counseling Consult - - Complete  SW Consult Not Complete Comments - - -  Palliative Care Screening Not Applicable Not Applicable Not [place] Not Applicable Not Applicable Not Applicable  Some recent data might be hidden

## 2019-10-26 NOTE — Progress Notes (Signed)
Anticoagulation monitoring(Lovenox):  58yo  male ordered Lovenox 30 mg Q24h  Filed Weights   10/24/19 1332 10/25/19 2042  Weight: 145 lb (65.8 kg) 144 lb 12.1 oz (65.7 kg)   BMI 19.63   Lab Results  Component Value Date   CREATININE 0.91 10/26/2019   CREATININE 1.07 10/25/2019   CREATININE 1.56 (H) 10/25/2019   Estimated Creatinine Clearance: 80.2 mL/min (by C-G formula based on SCr of 0.91 mg/dL). Hemoglobin & Hematocrit     Component Value Date/Time   HGB 12.5 (L) 10/24/2019 1356   HCT 41.3 10/24/2019 1356     Per Protocol for Patient with estCrcl > 30 ml/min and BMI < 40, will transition to Lovenox 40 mg Q24h.

## 2019-10-26 NOTE — Progress Notes (Signed)
Pt reports ride is here. Pt transported in transport chair to private vehicle. Discharge home to self/home care.

## 2019-10-26 NOTE — Progress Notes (Signed)
Pt's glucose readings stable. Denies co's/concerns. TOC support completed. Oral and written AVS instructions given with printed prescriptions of some meds. Ready for discharge when ride arrives.

## 2019-11-25 ENCOUNTER — Inpatient Hospital Stay
Admission: EM | Admit: 2019-11-25 | Discharge: 2019-11-28 | DRG: 639 | Disposition: A | Discharge status: Home / Self Care | Payer: Medicaid Other | Attending: Internal Medicine | Admitting: Internal Medicine

## 2019-11-25 ENCOUNTER — Emergency Department: Payer: Medicaid Other

## 2019-11-25 ENCOUNTER — Other Ambulatory Visit: Payer: Self-pay

## 2019-11-25 DIAGNOSIS — Z91128 Patient's intentional underdosing of medication regimen for other reason: Secondary | ICD-10-CM

## 2019-11-25 DIAGNOSIS — E1110 Type 2 diabetes mellitus with ketoacidosis without coma: Principal | ICD-10-CM | POA: Diagnosis present

## 2019-11-25 DIAGNOSIS — R739 Hyperglycemia, unspecified: Secondary | ICD-10-CM

## 2019-11-25 DIAGNOSIS — Z794 Long term (current) use of insulin: Secondary | ICD-10-CM

## 2019-11-25 DIAGNOSIS — Z9114 Patient's other noncompliance with medication regimen: Secondary | ICD-10-CM

## 2019-11-25 DIAGNOSIS — Y92009 Unspecified place in unspecified non-institutional (private) residence as the place of occurrence of the external cause: Secondary | ICD-10-CM

## 2019-11-25 DIAGNOSIS — T383X6A Underdosing of insulin and oral hypoglycemic [antidiabetic] drugs, initial encounter: Secondary | ICD-10-CM | POA: Diagnosis present

## 2019-11-25 DIAGNOSIS — F17200 Nicotine dependence, unspecified, uncomplicated: Secondary | ICD-10-CM | POA: Diagnosis present

## 2019-11-25 DIAGNOSIS — Z20822 Contact with and (suspected) exposure to covid-19: Secondary | ICD-10-CM | POA: Diagnosis present

## 2019-11-25 DIAGNOSIS — E1140 Type 2 diabetes mellitus with diabetic neuropathy, unspecified: Secondary | ICD-10-CM | POA: Diagnosis present

## 2019-11-25 DIAGNOSIS — I2510 Atherosclerotic heart disease of native coronary artery without angina pectoris: Secondary | ICD-10-CM | POA: Diagnosis present

## 2019-11-25 DIAGNOSIS — Z7982 Long term (current) use of aspirin: Secondary | ICD-10-CM

## 2019-11-25 DIAGNOSIS — Z8249 Family history of ischemic heart disease and other diseases of the circulatory system: Secondary | ICD-10-CM

## 2019-11-25 DIAGNOSIS — I10 Essential (primary) hypertension: Secondary | ICD-10-CM | POA: Diagnosis present

## 2019-11-25 DIAGNOSIS — Z79899 Other long term (current) drug therapy: Secondary | ICD-10-CM

## 2019-11-25 DIAGNOSIS — K219 Gastro-esophageal reflux disease without esophagitis: Secondary | ICD-10-CM | POA: Diagnosis present

## 2019-11-25 DIAGNOSIS — Z951 Presence of aortocoronary bypass graft: Secondary | ICD-10-CM

## 2019-11-25 LAB — URINE DRUG SCREEN, QUALITATIVE (ARMC ONLY)
Amphetamines, Ur Screen: NOT DETECTED
Barbiturates, Ur Screen: NOT DETECTED
Benzodiazepine, Ur Scrn: NOT DETECTED
Cannabinoid 50 Ng, Ur ~~LOC~~: NOT DETECTED
Cocaine Metabolite,Ur ~~LOC~~: NOT DETECTED
MDMA (Ecstasy)Ur Screen: NOT DETECTED
Methadone Scn, Ur: NOT DETECTED
Opiate, Ur Screen: NOT DETECTED
Phencyclidine (PCP) Ur S: NOT DETECTED
Tricyclic, Ur Screen: NOT DETECTED

## 2019-11-25 LAB — URINALYSIS, COMPLETE (UACMP) WITH MICROSCOPIC
Bacteria, UA: NONE SEEN
Bilirubin Urine: NEGATIVE
Glucose, UA: >=500 mg/dL — AB
Hgb urine dipstick: NEGATIVE
Ketones, ur: 80 mg/dL — AB
Leukocytes,Ua: NEGATIVE
Nitrite: NEGATIVE
Protein, ur: 30 mg/dL — AB
Specific Gravity, Urine: 1.018 (ref 1.005–1.030)
Squamous Epithelial / HPF: NONE SEEN (ref 0–5)
pH: 5 (ref 5.0–8.0)

## 2019-11-25 LAB — COMPREHENSIVE METABOLIC PANEL
ALT: 15 U/L (ref 0–44)
AST: 15 U/L (ref 15–41)
Albumin: 4.2 g/dL (ref 3.5–5.0)
Alkaline Phosphatase: 102 U/L (ref 38–126)
Anion gap: 19 — ABNORMAL HIGH (ref 5–15)
BUN: 59 mg/dL — ABNORMAL HIGH (ref 6–20)
CO2: 20 mmol/L — ABNORMAL LOW (ref 22–32)
Calcium: 9.8 mg/dL (ref 8.9–10.3)
Chloride: 105 mmol/L (ref 98–111)
Creatinine, Ser: 2 mg/dL — ABNORMAL HIGH (ref 0.61–1.24)
GFR calc Af Amer: 41 mL/min — ABNORMAL LOW (ref >60)
GFR calc non Af Amer: 35 mL/min — ABNORMAL LOW (ref >60)
Glucose, Bld: 306 mg/dL — ABNORMAL HIGH (ref 70–99)
Potassium: 4.9 mmol/L (ref 3.5–5.1)
Sodium: 144 mmol/L (ref 135–145)
Total Bilirubin: 1.5 mg/dL — ABNORMAL HIGH (ref 0.3–1.2)
Total Protein: 7.5 g/dL (ref 6.5–8.1)

## 2019-11-25 LAB — BLOOD GAS, VENOUS
Acid-base deficit: 8 mmol/L — ABNORMAL HIGH (ref 0.0–2.0)
Bicarbonate: 19.3 mmol/L — ABNORMAL LOW (ref 20.0–28.0)
O2 Saturation: 51.1 %
Patient temperature: 37
pCO2, Ven: 45 mmHg (ref 44.0–60.0)
pH, Ven: 7.24 — ABNORMAL LOW (ref 7.250–7.430)
pO2, Ven: 33 mmHg (ref 32.0–45.0)

## 2019-11-25 LAB — CBC WITH DIFFERENTIAL/PLATELET
Abs Immature Granulocytes: 0.18 10*3/uL — ABNORMAL HIGH (ref 0.00–0.07)
Basophils Absolute: 0.1 10*3/uL (ref 0.0–0.1)
Basophils Relative: 0 %
Eosinophils Absolute: 0 10*3/uL (ref 0.0–0.5)
Eosinophils Relative: 0 %
HCT: 44.5 % (ref 39.0–52.0)
Hemoglobin: 15.2 g/dL (ref 13.0–17.0)
Immature Granulocytes: 1 %
Lymphocytes Relative: 8 %
Lymphs Abs: 1.3 10*3/uL (ref 0.7–4.0)
MCH: 31.3 pg (ref 26.0–34.0)
MCHC: 34.2 g/dL (ref 30.0–36.0)
MCV: 91.8 fL (ref 80.0–100.0)
Monocytes Absolute: 1 10*3/uL (ref 0.1–1.0)
Monocytes Relative: 6 %
Neutro Abs: 14.8 10*3/uL — ABNORMAL HIGH (ref 1.7–7.7)
Neutrophils Relative %: 85 %
Platelets: 415 10*3/uL — ABNORMAL HIGH (ref 150–400)
RBC: 4.85 MIL/uL (ref 4.22–5.81)
RDW: 13.2 % (ref 11.5–15.5)
WBC: 17.4 10*3/uL — ABNORMAL HIGH (ref 4.0–10.5)
nRBC: 0 % (ref 0.0–0.2)

## 2019-11-25 LAB — ETHANOL: Alcohol, Ethyl (B): <10 mg/dL (ref <10)

## 2019-11-25 LAB — GLUCOSE, CAPILLARY: Glucose-Capillary: 426 mg/dL — ABNORMAL HIGH (ref 70–99)

## 2019-11-25 LAB — LIPASE, BLOOD: Lipase: 25 U/L (ref 11–51)

## 2019-11-25 MED ORDER — SODIUM CHLORIDE 0.9 % IV BOLUS
1000.0000 mL | Freq: Once | INTRAVENOUS | Status: AC
  Administered 2019-11-25: 22:00:00 1000 mL via INTRAVENOUS

## 2019-11-25 NOTE — ED Triage Notes (Signed)
Pt brought in by [facility] from home. Sister called because she felt like he was lethargic. Pt has hx of DKA and she told ems he is non compliant with meds. Sister was concerned about possible DKA, pt denies any shob or pain. Pt states "I am just tired they won't let me sleep". Pt states he does take all his meds and checks fsbs at home but unsure of numbers. EMS states he vomited x 1 en route and they gave 4 mg zofran IV. Per ems initial fsbs was high and after 500 ml of ns bolus repeat fsbs was 525.

## 2019-11-25 NOTE — ED Provider Notes (Signed)
[facility] Emergency Department Provider Note   ____________________________________________   First MD Initiated Contact with Patient 11/25/19 2128     (approximate)  I have reviewed the triage vital signs and the nursing notes.   HISTORY  Chief Complaint Fatigue    HPI Spencer [name] is a 59 y.o. male with past medical history of poorly controlled diabetes and polysubstance abuse who presents to the ED for fatigue.  History is limited as patient is feeling generally weak and not very forthcoming.  Per EMS, family noted that he has been much more fatigued over the past couple of days and not taking his diabetic medications and insulin appropriately.  He has been spending much of his time in bed sleeping.  He has not had any recent fevers and patient denies any cough, chest pain, abdominal pain, or shortness of breath.  He did vomit once with EMS, but denies any ongoing nausea.  He denies any dysuria or hematuria.  He states he has been taking his diabetic medications as prescribed and denies any recent alcohol or drug abuse.        Past Medical History:  Diagnosis Date  . Brain aneurysm   . Broken bones    clavical, ankle, arms toes wriast   . Diabetes mellitus without complication ([place])   . Dysrhythmia   . GERD (gastroesophageal reflux disease)   . Hypertension   . Substance abuse [facility])     Patient Active Problem List   Diagnosis Date Noted  . Tobacco abuse counseling   . Atrial fibrillation with RVR ([place]) 08/07/2019  . GERD (gastroesophageal reflux disease) 08/07/2019  . Acute renal failure ([place])   . Hyperkalemia   . Sepsis ([place]) 06/09/2019  . AKI (acute kidney injury) ([place]) 02/19/2019  . Hypoglycemia 10/23/2018  . Chronic low back pain 01/16/2017  . Chronic neck pain 01/16/2017  . Diabetic neuropathy ([place]) 01/16/2017  . DM2 (diabetes mellitus, type 2) ([place]) 01/16/2017  . History of cocaine abuse ([place]) 01/16/2017  . Personal history of  subdural hematoma 01/16/2017  . Closed displaced fracture of body of left calcaneus with delayed healing 12/13/2016  . Protein-calorie malnutrition, severe 11/08/2016  . DKA, type 2 ([place]) 11/06/2016  . DKA (diabetic ketoacidoses) ([place]) 04/03/2015  . Hypertension 04/03/2015  . Depression 04/03/2015  . Opiate dependence ([place]) 04/03/2015  . Tobacco abuse 04/03/2015  . Lumbosacral neuritis 07/19/2014  . Pain of finger of right hand 07/19/2014  . Type II or unspecified type diabetes mellitus without mention of complication, not stated as uncontrolled 07/19/2014  . Knee pain 09/12/2013  . Hernia of flank 09/20/2012  . Epidermoid cyst of skin 09/20/2012  . Chronic pain of both shoulders 03/27/2012    Past Surgical History:  Procedure Laterality Date  . CORONARY/GRAFT ACUTE MI REVASCULARIZATION N/A 09/04/2019   Procedure: Coronary/Graft Acute MI Revascularization;  Surgeon: Yolonda Kida, MD;  Location: [place] [facility];  Service: Cardiovascular;  Laterality: N/A;  . ESOPHAGOGASTRODUODENOSCOPY (EGD) WITH PROPOFOL N/A 06/04/2019   Procedure: ESOPHAGOGASTRODUODENOSCOPY (EGD) WITH PROPOFOL;  Surgeon: Toledo, Benay Pike, MD;  Location: [facility] ENDOSCOPY;  Service: Gastroenterology;  Laterality: N/A;  . HERNIA REPAIR    . IMPLANTATION / PLACEMENT OF STRIP ELECTRODES VIA BURR HOLES SUBDURAL     anyrusum  . LEFT HEART CATH AND CORONARY ANGIOGRAPHY N/A 09/04/2019   Procedure: LEFT HEART CATH AND CORONARY ANGIOGRAPHY;  Surgeon: Yolonda Kida, MD;  Location: [place] [facility];  Service: Cardiovascular;  Laterality: N/A;  .  NECK SURGERY    . TEE WITHOUT CARDIOVERSION N/A 06/03/2019   Procedure: TRANSESOPHAGEAL ECHOCARDIOGRAM (TEE);  Surgeon: Fath, Kenneth A, MD;  Location: [facility] ORS;  Service: Cardiovascular;  Laterality: N/A;    Prior to Admission medications   Medication Sig Start Date End Date Taking? Authorizing Provider  aspirin EC 81 MG tablet Take 1 tablet (81 mg total) by mouth  daily. 10/26/19 05/13/20  Sreenath, Sudheer B, MD  blood glucose meter kit and supplies KIT Dispense based on patient and insurance preference. Use up to four times daily as directed. (FOR ICD-9 250.00, 250.01). 10/26/19   Sreenath, Sudheer B, MD  insulin glargine (LANTUS) 100 unit/mL SOPN Inject 0.22 mLs (22 Units total) into the skin daily. 10/26/19   Sreenath, Sudheer B, MD  metFORMIN (GLUCOPHAGE) 1000 MG tablet Take 1 tablet (1,000 mg total) by mouth 2 (two) times daily with a meal. 10/26/19 11/25/19  Sreenath, Sudheer B, MD  NOVOLOG FLEXPEN 100 UNIT/ML FlexPen Inject 0-30 Units into the skin 3 (three) times daily. 09/10/19   Danford, Christopher P, MD  pantoprazole (PROTONIX) 40 MG tablet Take 1 tablet (40 mg total) by mouth daily. 10/26/19 11/25/19  Sreenath, Sudheer B, MD  sitaGLIPtin (JANUVIA) 50 MG tablet Take 1 tablet (50 mg total) by mouth daily. 10/26/19 11/25/19  Sreenath, Sudheer B, MD    Allergies Patient has no known allergies.  Family History  Problem Relation Age of Onset  . CAD Sister   . Hypertension Sister   . Healthy Mother   . Healthy Father     Social History Social History   Tobacco Use  . Smoking status: Current Some Day Smoker  . Smokeless tobacco: Never Used  Substance Use Topics  . Alcohol use: Yes    Alcohol/week: 1.0 standard drinks    Types: 1 Cans of beer per week  . Drug use: Not Currently    Types: Cocaine    Review of Systems  Constitutional: No fever/chills.  Positive for fatigue and generalized weakness. Eyes: No visual changes. ENT: No sore throat. Cardiovascular: Denies chest pain. Respiratory: Denies shortness of breath. Gastrointestinal: No abdominal pain.  No nausea, no vomiting.  No diarrhea.  No constipation. Genitourinary: Negative for dysuria. Musculoskeletal: Negative for back pain. Skin: Negative for rash. Neurological: Negative for headaches, focal weakness or  numbness.  ____________________________________________   PHYSICAL EXAM:  VITAL SIGNS: ED Triage Vitals  Enc Vitals Group     BP      Pulse      Resp      Temp      Temp src      SpO2      Weight      Height      Head Circumference      Peak Flow      Pain Score      Pain Loc      Pain Edu?      Excl. in GC?     Constitutional: Alert and oriented. Eyes: Conjunctivae are normal. Head: Atraumatic. Nose: No congestion/rhinnorhea. Mouth/Throat: Mucous membranes are dry. Neck: Normal ROM Cardiovascular: Normal rate, regular rhythm. Grossly normal heart sounds. Respiratory: Normal respiratory effort.  No retractions. Lungs CTAB. Gastrointestinal: Soft and nontender. No distention. Genitourinary: deferred Musculoskeletal: No lower extremity tenderness nor edema. Neurologic:  Normal speech and language. No gross focal neurologic deficits are appreciated. Skin:  Skin is warm, dry and intact. No rash noted. Psychiatric: Mood and affect are normal. Speech and behavior are normal.  ____________________________________________     LABS (all labs ordered are listed, but only abnormal results are displayed)  Labs Reviewed  CBC WITH DIFFERENTIAL/PLATELET - Abnormal; Notable for the following components:      Result Value   WBC 17.4 (*)    Platelets 415 (*)    Neutro Abs 14.8 (*)    Abs Immature Granulocytes 0.18 (*)    All other components within normal limits  BLOOD GAS, VENOUS - Abnormal; Notable for the following components:   pH, Ven 7.24 (*)    Bicarbonate 19.3 (*)    Acid-base deficit 8.0 (*)    All other components within normal limits  URINALYSIS, COMPLETE (UACMP) WITH MICROSCOPIC - Abnormal; Notable for the following components:   Color, Urine YELLOW (*)    APPearance HAZY (*)    Glucose, UA >=500 (*)    Ketones, ur 80 (*)    Protein, ur 30 (*)    All other components within normal limits  GLUCOSE, CAPILLARY - Abnormal; Notable for the following components:    Glucose-Capillary 426 (*)    All other components within normal limits  COMPREHENSIVE METABOLIC PANEL - Abnormal; Notable for the following components:   CO2 20 (*)    Glucose, Bld 306 (*)    BUN 59 (*)    Creatinine, Ser 2.00 (*)    Total Bilirubin 1.5 (*)    GFR calc non Af Amer 35 (*)    GFR calc Af Amer 41 (*)    Anion gap 19 (*)    All other components within normal limits  URINE DRUG SCREEN, QUALITATIVE ([facility] ONLY)  ETHANOL  LIPASE, BLOOD  BETA-HYDROXYBUTYRIC ACID   ____________________________________________  EKG  ED ECG REPORT I, Blake Divine, the attending physician, personally viewed and interpreted this ECG.   Date: 11/25/2019  EKG Time: 21:42  Rate: 90  Rhythm: normal sinus rhythm  Axis: Normal  Intervals:none  ST&T Change: None   PROCEDURES  Procedure(s) performed (including Critical Care):  Procedures   ____________________________________________   INITIAL IMPRESSION / ASSESSMENT AND PLAN / ED COURSE       59 year old male with history of poorly controlled diabetes and recurrent DKA presents to the ED after he family called EMS for increasing fatigue.  EMS reports that patient has not consistently been maintaining his diabetic regimen and initial blood glucose read as "high".  I have concerns for recurrent DKA, but patient denies any infectious symptoms at this time.  Lab work was drawn, will initiate hydration with fluids.  EKG without evidence of arrhythmia or hyperkalemia.  Patient turned over to Dr. Owens Shark pending lab results.      ____________________________________________   FINAL CLINICAL IMPRESSION(S) / ED DIAGNOSES  Final diagnoses:  Hyperglycemia     ED Discharge Orders    None       Note:  This document was prepared using Dragon voice recognition software and may include unintentional dictation errors.   Blake Divine, MD 11/26/19 [phone]

## 2019-11-26 DIAGNOSIS — E1140 Type 2 diabetes mellitus with diabetic neuropathy, unspecified: Secondary | ICD-10-CM | POA: Diagnosis present

## 2019-11-26 DIAGNOSIS — E1110 Type 2 diabetes mellitus with ketoacidosis without coma: Secondary | ICD-10-CM | POA: Diagnosis not present

## 2019-11-26 DIAGNOSIS — K219 Gastro-esophageal reflux disease without esophagitis: Secondary | ICD-10-CM | POA: Diagnosis present

## 2019-11-26 DIAGNOSIS — Z20822 Contact with and (suspected) exposure to covid-19: Secondary | ICD-10-CM | POA: Diagnosis present

## 2019-11-26 DIAGNOSIS — I2510 Atherosclerotic heart disease of native coronary artery without angina pectoris: Secondary | ICD-10-CM | POA: Diagnosis present

## 2019-11-26 DIAGNOSIS — Z8249 Family history of ischemic heart disease and other diseases of the circulatory system: Secondary | ICD-10-CM | POA: Diagnosis not present

## 2019-11-26 DIAGNOSIS — F17200 Nicotine dependence, unspecified, uncomplicated: Secondary | ICD-10-CM | POA: Diagnosis present

## 2019-11-26 DIAGNOSIS — Z951 Presence of aortocoronary bypass graft: Secondary | ICD-10-CM | POA: Diagnosis not present

## 2019-11-26 DIAGNOSIS — T383X6A Underdosing of insulin and oral hypoglycemic [antidiabetic] drugs, initial encounter: Secondary | ICD-10-CM | POA: Diagnosis present

## 2019-11-26 DIAGNOSIS — Z79899 Other long term (current) drug therapy: Secondary | ICD-10-CM | POA: Diagnosis not present

## 2019-11-26 DIAGNOSIS — Z7982 Long term (current) use of aspirin: Secondary | ICD-10-CM | POA: Diagnosis not present

## 2019-11-26 DIAGNOSIS — Z9114 Patient's other noncompliance with medication regimen: Secondary | ICD-10-CM | POA: Diagnosis not present

## 2019-11-26 DIAGNOSIS — I10 Essential (primary) hypertension: Secondary | ICD-10-CM | POA: Diagnosis present

## 2019-11-26 DIAGNOSIS — R112 Nausea with vomiting, unspecified: Secondary | ICD-10-CM

## 2019-11-26 DIAGNOSIS — Y92009 Unspecified place in unspecified non-institutional (private) residence as the place of occurrence of the external cause: Secondary | ICD-10-CM | POA: Diagnosis not present

## 2019-11-26 DIAGNOSIS — Z794 Long term (current) use of insulin: Secondary | ICD-10-CM | POA: Diagnosis not present

## 2019-11-26 DIAGNOSIS — R739 Hyperglycemia, unspecified: Secondary | ICD-10-CM | POA: Diagnosis not present

## 2019-11-26 DIAGNOSIS — Z91128 Patient's intentional underdosing of medication regimen for other reason: Secondary | ICD-10-CM | POA: Diagnosis not present

## 2019-11-26 LAB — GLUCOSE, CAPILLARY
Glucose-Capillary: 226 mg/dL — ABNORMAL HIGH (ref 70–99)
Glucose-Capillary: 206 mg/dL — ABNORMAL HIGH (ref 70–99)
Glucose-Capillary: 167 mg/dL — ABNORMAL HIGH (ref 70–99)
Glucose-Capillary: 127 mg/dL — ABNORMAL HIGH (ref 70–99)
Glucose-Capillary: 135 mg/dL — ABNORMAL HIGH (ref 70–99)
Glucose-Capillary: 135 mg/dL — ABNORMAL HIGH (ref 70–99)
Glucose-Capillary: 118 mg/dL — ABNORMAL HIGH (ref 70–99)
Glucose-Capillary: 98 mg/dL (ref 70–99)
Glucose-Capillary: 350 mg/dL — ABNORMAL HIGH (ref 70–99)
Glucose-Capillary: 272 mg/dL — ABNORMAL HIGH (ref 70–99)
Glucose-Capillary: 98 mg/dL (ref 70–99)
Glucose-Capillary: 70 mg/dL (ref 70–99)
Glucose-Capillary: 91 mg/dL (ref 70–99)
Glucose-Capillary: 79 mg/dL (ref 70–99)

## 2019-11-26 LAB — HEMOGLOBIN A1C
Hgb A1c MFr Bld: 12.2 % — ABNORMAL HIGH (ref 4.8–5.6)
Mean Plasma Glucose: 303.44 mg/dL

## 2019-11-26 LAB — BASIC METABOLIC PANEL
Anion gap: 11 (ref 5–15)
Anion gap: 10 (ref 5–15)
BUN: 61 mg/dL — ABNORMAL HIGH (ref 6–20)
BUN: 59 mg/dL — ABNORMAL HIGH (ref 6–20)
CO2: 26 mmol/L (ref 22–32)
CO2: 25 mmol/L (ref 22–32)
Calcium: 9.9 mg/dL (ref 8.9–10.3)
Calcium: 9.7 mg/dL (ref 8.9–10.3)
Chloride: 107 mmol/L (ref 98–111)
Chloride: 109 mmol/L (ref 98–111)
Creatinine, Ser: 1.72 mg/dL — ABNORMAL HIGH (ref 0.61–1.24)
Creatinine, Ser: 1.49 mg/dL — ABNORMAL HIGH (ref 0.61–1.24)
GFR calc Af Amer: 49 mL/min — ABNORMAL LOW (ref >60)
GFR calc Af Amer: 58 mL/min — ABNORMAL LOW (ref >60)
GFR calc non Af Amer: 42 mL/min — ABNORMAL LOW (ref >60)
GFR calc non Af Amer: 50 mL/min — ABNORMAL LOW (ref >60)
Glucose, Bld: 154 mg/dL — ABNORMAL HIGH (ref 70–99)
Glucose, Bld: 103 mg/dL — ABNORMAL HIGH (ref 70–99)
Potassium: 4.4 mmol/L (ref 3.5–5.1)
Potassium: 4 mmol/L (ref 3.5–5.1)
Sodium: 144 mmol/L (ref 135–145)
Sodium: 144 mmol/L (ref 135–145)

## 2019-11-26 LAB — CBC
HCT: 40.1 % (ref 39.0–52.0)
Hemoglobin: 13.9 g/dL (ref 13.0–17.0)
MCH: 31.2 pg (ref 26.0–34.0)
MCHC: 34.7 g/dL (ref 30.0–36.0)
MCV: 89.9 fL (ref 80.0–100.0)
Platelets: 368 10*3/uL (ref 150–400)
RBC: 4.46 MIL/uL (ref 4.22–5.81)
RDW: 13.1 % (ref 11.5–15.5)
WBC: 16 10*3/uL — ABNORMAL HIGH (ref 4.0–10.5)
nRBC: 0 % (ref 0.0–0.2)

## 2019-11-26 LAB — MRSA PCR SCREENING: MRSA by PCR: NEGATIVE

## 2019-11-26 LAB — BETA-HYDROXYBUTYRIC ACID: Beta-Hydroxybutyric Acid: 4.82 mmol/L — ABNORMAL HIGH (ref 0.05–0.27)

## 2019-11-26 LAB — RESPIRATORY PANEL BY RT PCR (FLU A&B, COVID)
Influenza A by PCR: NEGATIVE
Influenza B by PCR: NEGATIVE
SARS Coronavirus 2 by RT PCR: NEGATIVE

## 2019-11-26 MED ORDER — ACETAMINOPHEN 650 MG RE SUPP: 650.0000 mg | Freq: Four times a day (QID) | RECTAL | Status: DC | PRN

## 2019-11-26 MED ORDER — SODIUM CHLORIDE 0.9 % IV SOLN: INTRAVENOUS | Status: DC

## 2019-11-26 MED ORDER — DEXTROSE 50 % IV SOLN: 0.0000 mL | INTRAVENOUS | Status: DC | PRN

## 2019-11-26 MED ORDER — INSULIN REGULAR(HUMAN) IN NACL 100-0.9 UT/100ML-% IV SOLN: INTRAVENOUS | Status: DC

## 2019-11-26 MED ORDER — ENOXAPARIN SODIUM 40 MG/0.4ML ~~LOC~~ SOLN
40.0000 mg | SUBCUTANEOUS | Status: DC
  Administered 2019-11-27 – 2019-11-28 (×2): 40 mg via SUBCUTANEOUS
  Filled 2019-11-26 (×2): qty 0.4

## 2019-11-26 MED ORDER — INSULIN DETEMIR 100 UNIT/ML ~~LOC~~ SOLN: 5.0000 [IU] | Freq: Two times a day (BID) | SUBCUTANEOUS | Status: DC

## 2019-11-26 MED ORDER — INSULIN DETEMIR 100 UNIT/ML ~~LOC~~ SOLN
5.0000 [IU] | Freq: Two times a day (BID) | SUBCUTANEOUS | Status: DC
  Administered 2019-11-26: 5 [IU] via SUBCUTANEOUS
  Filled 2019-11-26 (×3): qty 0.05

## 2019-11-26 MED ORDER — ONDANSETRON HCL 4 MG/2ML IJ SOLN: 4.0000 mg | Freq: Four times a day (QID) | INTRAMUSCULAR | Status: DC | PRN

## 2019-11-26 MED ORDER — INSULIN REGULAR(HUMAN) IN NACL 100-0.9 UT/100ML-% IV SOLN
INTRAVENOUS | Status: DC
  Administered 2019-11-26: 01:00:00 6 [IU]/h via INTRAVENOUS
  Filled 2019-11-26: qty 100

## 2019-11-26 MED ORDER — INSULIN ASPART 100 UNIT/ML ~~LOC~~ SOLN
0.0000 [IU] | Freq: Every day | SUBCUTANEOUS | Status: DC
  Administered 2019-11-27: 23:00:00 5 [IU] via SUBCUTANEOUS
  Filled 2019-11-26: qty 1

## 2019-11-26 MED ORDER — MAGNESIUM HYDROXIDE 400 MG/5ML PO SUSP
30.0000 mL | Freq: Every day | ORAL | Status: DC | PRN
  Administered 2019-11-28: 07:00:00 30 mL via ORAL
  Filled 2019-11-26: qty 30

## 2019-11-26 MED ORDER — ONDANSETRON HCL 4 MG PO TABS: 4.0000 mg | ORAL_TABLET | Freq: Four times a day (QID) | ORAL | Status: DC | PRN

## 2019-11-26 MED ORDER — DEXTROSE-NACL 5-0.45 % IV SOLN: INTRAVENOUS | Status: DC

## 2019-11-26 MED ORDER — CHLORHEXIDINE GLUCONATE CLOTH 2 % EX PADS
6.0000 | MEDICATED_PAD | Freq: Every day | CUTANEOUS | Status: DC
  Administered 2019-11-26 – 2019-11-27 (×2): 6 via TOPICAL

## 2019-11-26 MED ORDER — TRAZODONE HCL 50 MG PO TABS: 25.0000 mg | ORAL_TABLET | Freq: Every evening | ORAL | Status: DC | PRN

## 2019-11-26 MED ORDER — INSULIN GLARGINE 100 UNIT/ML ~~LOC~~ SOLN
15.0000 [IU] | Freq: Every day | SUBCUTANEOUS | Status: DC
  Filled 2019-11-26 (×2): qty 0.15

## 2019-11-26 MED ORDER — INSULIN ASPART 100 UNIT/ML ~~LOC~~ SOLN
0.0000 [IU] | Freq: Three times a day (TID) | SUBCUTANEOUS | Status: DC
  Administered 2019-11-27: 08:00:00 3 [IU] via SUBCUTANEOUS
  Filled 2019-11-26: qty 1

## 2019-11-26 MED ORDER — INSULIN ASPART 100 UNIT/ML ~~LOC~~ SOLN: 2.0000 [IU] | SUBCUTANEOUS | Status: DC

## 2019-11-26 MED ORDER — ACETAMINOPHEN 325 MG PO TABS: 650.0000 mg | ORAL_TABLET | Freq: Four times a day (QID) | ORAL | Status: DC | PRN

## 2019-11-26 NOTE — Progress Notes (Signed)
No charge progress note.  Was feeling better when seen this morning.  He was feeling hungry.  Denies any nausea vomiting.  Physical exam was within normal limits. Gap was closed x1 Repeat BMP with closed X2. -Give him Lantus 15 units and discontinue insulin gtt. -Start him on SSI.

## 2019-11-26 NOTE — H&P (Signed)
[place] [place] NAME: Spencer [name]    MR#:  [id number]  DATE OF BIRTH:  1961-06-09  DATE OF ADMISSION:  11/26/2019  PRIMARY CARE PHYSICIAN: Jodi Marble, MD   REQUESTING/REFERRING PHYSICIAN: Marjean Donna, MD CHIEF COMPLAINT:   Chief Complaint  Patient presents with  . Fatigue    HISTORY OF PRESENT ILLNESS:  Spencer [name]  is a 59 y.o. Caucasian male with a known history of coronary status post CABG, type II diabetes mellitus, hypertension, substance abuse and GERD, who presented to the emergency room with acute onset of malaise and fatigue over the last couple of days but stated that he did not miss his diabetic medications.  He had nausea and vomiting once with EMS upon arrival here.  Is been fairly sleepy at home.  No fever or chills or cough or dyspnea or chest pain.  Denies abdominal pain or melena or bright red blood per rectum.  No dysuria however he has been having polyuria and polydipsia.  Upon presentation to emergency room, vital signs were within normal.  Labs revealed a blood glucose of 306 with CO2 29 anion gap of 19 with total bili 1.5.  Beta hydroxybutyrate was elevated at 4.82.  BUN and creatinine were elevated at 59/2 compared to 31/0.9 last months.  Urinalysis showed more than 500 glucose and hyaline casts.  Alcohol levels less than 10 urine drug screen was negative.  Portable chest ray showed no acute cardiopulmonary disease.  The patient was placed on IV insulin per Endo tool.  He will be admitted to stepdown unit bed for further evaluation and management PAST MEDICAL HISTORY:   Past Medical History:  Diagnosis Date  . Brain aneurysm   . Broken bones    clavical, ankle, arms toes wriast   . Diabetes mellitus without complication ([place])   . Dysrhythmia   . GERD (gastroesophageal reflux disease)   . Hypertension   . Substance abuse ([place])   Coronary artery disease status post CABG.  PAST SURGICAL HISTORY:   Past Surgical  History:  Procedure Laterality Date  . CORONARY/GRAFT ACUTE MI REVASCULARIZATION N/A 09/04/2019   Procedure: Coronary/Graft Acute MI Revascularization;  Surgeon: Yolonda Kida, MD;  Location: [place] [facility];  Service: Cardiovascular;  Laterality: N/A;  . ESOPHAGOGASTRODUODENOSCOPY (EGD) WITH PROPOFOL N/A 06/04/2019   Procedure: ESOPHAGOGASTRODUODENOSCOPY (EGD) WITH PROPOFOL;  Surgeon: Toledo, Benay Pike, MD;  Location: [facility] ENDOSCOPY;  Service: Gastroenterology;  Laterality: N/A;  . HERNIA REPAIR    . IMPLANTATION / PLACEMENT OF STRIP ELECTRODES VIA BURR HOLES SUBDURAL     anyrusum  . LEFT HEART CATH AND CORONARY ANGIOGRAPHY N/A 09/04/2019   Procedure: LEFT HEART CATH AND CORONARY ANGIOGRAPHY;  Surgeon: Yolonda Kida, MD;  Location: [place] [facility];  Service: Cardiovascular;  Laterality: N/A;  . NECK SURGERY    . TEE WITHOUT CARDIOVERSION N/A 06/03/2019   Procedure: TRANSESOPHAGEAL ECHOCARDIOGRAM (TEE);  Surgeon: Teodoro Spray, MD;  Location: [facility] ORS;  Service: Cardiovascular;  Laterality: N/A;    SOCIAL HISTORY:   Social History   Tobacco Use  . Smoking status: Current Some Day Smoker  . Smokeless tobacco: Never Used  Substance Use Topics  . Alcohol use: Yes    Alcohol/week: 1.0 standard drinks    Types: 1 Cans of beer per week    FAMILY HISTORY:   Family History  Problem Relation Age of Onset  . CAD Sister   . Hypertension Sister   .  Healthy Mother   . Healthy Father     DRUG ALLERGIES:  No Known Allergies  REVIEW OF SYSTEMS:   ROS As per history of present illness. All pertinent systems were reviewed above. Constitutional,  HEENT, cardiovascular, respiratory, GI, GU, musculoskeletal, neuro, psychiatric, endocrine,  integumentary and hematologic systems were reviewed and are otherwise  negative/unremarkable except for positive findings mentioned above in the HPI.   MEDICATIONS AT HOME:   Prior to Admission medications   Medication Sig Start  Date End Date Taking? Authorizing Provider  aspirin EC 81 MG tablet Take 1 tablet (81 mg total) by mouth daily. 10/26/19 05/13/20  Sidney Ace, MD  blood glucose meter kit and supplies KIT Dispense based on patient and insurance preference. Use up to four times daily as directed. (FOR ICD-9 250.00, 250.01). 10/26/19   Sreenath, Sudheer B, MD  insulin glargine (LANTUS) 100 unit/mL SOPN Inject 0.22 mLs (22 Units total) into the skin daily. 10/26/19   Sidney Ace, MD  metFORMIN (GLUCOPHAGE) 1000 MG tablet Take 1 tablet (1,000 mg total) by mouth 2 (two) times daily with a meal. 10/26/19 11/25/19  Sreenath, Sudheer B, MD  NOVOLOG FLEXPEN 100 UNIT/ML FlexPen Inject 0-30 Units into the skin 3 (three) times daily. 09/10/19   Danford, Suann Larry, MD  pantoprazole (PROTONIX) 40 MG tablet Take 1 tablet (40 mg total) by mouth daily. 10/26/19 11/25/19  Sidney Ace, MD  sitaGLIPtin (JANUVIA) 50 MG tablet Take 1 tablet (50 mg total) by mouth daily. 10/26/19 11/25/19  Sidney Ace, MD      VITAL SIGNS:  Blood pressure 128/89, pulse 81, temperature 97.7 F (36.5 C), temperature source Oral, resp. rate 18, height 6' (1.829 m), weight 72.6 kg, SpO2 94 %.  PHYSICAL EXAMINATION:  Physical Exam  GENERAL:  59 y.o.-year-old ill-looking Caucasian male patient lying in the bed with no acute distress.  EYES: Pupils equal, round, reactive to light and accommodation. No scleral icterus. Extraocular muscles intact.  HEENT: Head atraumatic, normocephalic. Oropharynx with slightly dry mucous membrane and tongue and nasopharynx clear.  NECK:  Supple, no jugular venous distention. No thyroid enlargement, no tenderness.  LUNGS: Normal breath sounds bilaterally, no wheezing, rales,rhonchi or crepitation. No use of accessory muscles of respiration.  CARDIOVASCULAR: Regular rate and rhythm, S1, S2 normal. No murmurs, rubs, or gallops.  ABDOMEN: Soft, nondistended, nontender. Bowel sounds present. No  organomegaly or mass.  EXTREMITIES: No pedal edema, cyanosis, or clubbing.  NEUROLOGIC: Cranial nerves II through XII are intact. Muscle strength 5/5 in all extremities. Sensation intact. Gait not checked.  PSYCHIATRIC: The patient is alert and oriented x 3.  Normal affect and good eye contact. SKIN: No obvious rash, lesion, or ulcer.   LABORATORY PANEL:   CBC Recent Labs  Lab 11/25/19 2149  WBC 17.4*  HGB 15.2  HCT 44.5  PLT 415*   ------------------------------------------------------------------------------------------------------------------  Chemistries  Recent Labs  Lab 11/25/19 2335  NA 144  K 4.9  CL 105  CO2 20*  GLUCOSE 306*  BUN 59*  CREATININE 2.00*  CALCIUM 9.8  AST 15  ALT 15  ALKPHOS 102  BILITOT 1.5*   ------------------------------------------------------------------------------------------------------------------  Cardiac Enzymes No results for input(s): TROPONINI in the last 168 hours. ------------------------------------------------------------------------------------------------------------------  RADIOLOGY:  DG Chest Portable 1 View  Result Date: 11/25/2019 CLINICAL DATA:  Weakness EXAM: PORTABLE CHEST 1 VIEW COMPARISON:  10/24/2019 FINDINGS: The heart size and mediastinal contours are within normal limits. Both lungs are clear. The visualized skeletal structures are unremarkable.  IMPRESSION: No active disease. Electronically Signed   By: Ulyses Jarred M.D.   On: 11/25/2019 21:56      IMPRESSION AND PLAN:   1.  DKA with uncontrolled type 2 diabetes mellitus.  The patient will be admitted to stepdown unit bed.  We will continue him on IV insulin drip per Endo tool.  We will follow serial BMPs.  He will be aggressively hydrated with IV normal saline.  Metformin will be held off as well as Lantus and Januvia. Fatigue and tiredness are likely secondary to his DKA.  Will check COVID-19 PCR.  2.  Nausea and vomiting.  The patient will be hydrated  with IV normal saline.  As needed antiemetic will be utilized.  IV PPI will be continued.  3.  Coronary artery disease status post CABG.  Continue take with baby aspirin while monitoring for recurrent nausea and vomiting.  4.  DVT prophylaxis.  Subcutaneous Lovenox    All the records are reviewed and case discussed with ED provider. The plan of care was discussed in details with the patient (and family). I answered all questions. The patient agreed to proceed with the above mentioned plan. Further management will depend upon hospital course.   CODE STATUS: Full code  TOTAL TIME TAKING CARE OF THIS PATIENT: 55 minutes.    Christel Mormon M.D on 11/26/2019 at 12:49 AM  [facility]   From 7 PM-7 AM, contact night-coverage [email or web address]  CC: Primary care physician; Jodi Marble, MD   Note: This dictation was prepared with Dragon dictation along with smaller phrase technology. Any transcriptional errors that result from this process are unintentional.

## 2019-11-27 DIAGNOSIS — R739 Hyperglycemia, unspecified: Secondary | ICD-10-CM

## 2019-11-27 LAB — BASIC METABOLIC PANEL
Anion gap: 7 (ref 5–15)
BUN: 42 mg/dL — ABNORMAL HIGH (ref 6–20)
CO2: 23 mmol/L (ref 22–32)
Calcium: 8.9 mg/dL (ref 8.9–10.3)
Chloride: 105 mmol/L (ref 98–111)
Creatinine, Ser: 1 mg/dL (ref 0.61–1.24)
GFR calc Af Amer: >60 mL/min (ref >60)
GFR calc non Af Amer: >60 mL/min (ref >60)
Glucose, Bld: 134 mg/dL — ABNORMAL HIGH (ref 70–99)
Potassium: 3.8 mmol/L (ref 3.5–5.1)
Sodium: 135 mmol/L (ref 135–145)

## 2019-11-27 LAB — GLUCOSE, CAPILLARY
Glucose-Capillary: 91 mg/dL (ref 70–99)
Glucose-Capillary: 233 mg/dL — ABNORMAL HIGH (ref 70–99)
Glucose-Capillary: 528 mg/dL (ref 70–99)
Glucose-Capillary: 246 mg/dL — ABNORMAL HIGH (ref 70–99)
Glucose-Capillary: 96 mg/dL (ref 70–99)
Glucose-Capillary: 492 mg/dL — ABNORMAL HIGH (ref 70–99)

## 2019-11-27 MED ORDER — NEPRO/CARBSTEADY PO LIQD
237.0000 mL | Freq: Three times a day (TID) | ORAL | Status: DC
  Administered 2019-11-27 – 2019-11-28 (×2): 237 mL via ORAL

## 2019-11-27 MED ORDER — INSULIN GLARGINE 100 UNIT/ML ~~LOC~~ SOLN
15.0000 [IU] | Freq: Two times a day (BID) | SUBCUTANEOUS | Status: DC
  Administered 2019-11-27 – 2019-11-28 (×3): 15 [IU] via SUBCUTANEOUS
  Filled 2019-11-27 (×4): qty 0.15

## 2019-11-27 MED ORDER — INSULIN ASPART 100 UNIT/ML ~~LOC~~ SOLN: 5.0000 [IU] | Freq: Once | SUBCUTANEOUS | Status: AC

## 2019-11-27 MED ORDER — ADULT MULTIVITAMIN W/MINERALS CH
1.0000 | ORAL_TABLET | Freq: Every day | ORAL | Status: DC
  Administered 2019-11-28: 09:00:00 1 via ORAL
  Filled 2019-11-27: qty 1

## 2019-11-27 MED ORDER — INSULIN ASPART 100 UNIT/ML ~~LOC~~ SOLN
0.0000 [IU] | Freq: Three times a day (TID) | SUBCUTANEOUS | Status: DC
  Administered 2019-11-28: 09:00:00 3 [IU] via SUBCUTANEOUS
  Administered 2019-11-28: 12:00:00 5 [IU] via SUBCUTANEOUS
  Filled 2019-11-27 (×2): qty 1

## 2019-11-27 MED ORDER — INSULIN ASPART 100 UNIT/ML ~~LOC~~ SOLN
25.0000 [IU] | Freq: Once | SUBCUTANEOUS | Status: AC
  Administered 2019-11-27: 25 [IU] via SUBCUTANEOUS
  Filled 2019-11-27: qty 1

## 2019-11-27 NOTE — Progress Notes (Signed)
PROGRESS NOTE    Spencer [name]  JKK:[id number] DOB: 11-05-1961 DOA: 11/25/2019 PCP: [name], MD   Brief Narrative:  Spencer [name]  is a 59 y.o. Caucasian male with a known history of coronary status post CABG, type II diabetes mellitus, hypertension, substance abuse and GERD, who presented to the emergency room with acute onset of malaise and fatigue over the last couple of days. Found to be in DKA-initially treated with insulin gtt. then switched to Lantus and SSI.  Subjective: Patient was feeling better when seen this morning.  He was able to tolerate diet well.  No nausea or vomiting.  Apparently Lantus was held overnight due to CBG below 100 and CBG rises above 500 this morning.  Patient does not feel safe to go home.  Assessment & Plan:   Active Problems:   DKA (diabetic ketoacidoses) (HCC)  DKA with uncontrolled type 2 diabetes mellitus.  DKA resolved.  CBG elevated above 500 this morning as patient did not received his Lantus overnight. -Give him an extra dose of NovoLog 25 units with improvement in CBG. A1c elevated at 12.2. -Continue Lantus and SSI.  Coronary artery disease status post CABG. no chest pain. -Continue with aspirin.  Objective: Vitals:   11/27/19 1400 11/27/19 1500 11/27/19 1600 11/27/19 1700  BP: 113/75 125/86 136/77 125/83  Pulse: 81 70 71 80  Resp: 19 18 (!) 21 19  Temp:      TempSrc:      SpO2: 95% 94% 96% 96%  Weight:      Height:        Intake/Output Summary (Last 24 hours) at 11/27/2019 1913 Last data filed at 11/27/2019 1700 Gross per 24 hour  Intake 994 ml  Output 2100 ml  Net -1106 ml   Filed Weights   11/25/19 2138 11/26/19 0600  Weight: 72.6 kg 62.5 kg    Examination:  General exam: Appears calm and comfortable  Respiratory system: Clear to auscultation. Respiratory effort normal. Cardiovascular system: S1 & S2 heard, RRR. No JVD, murmurs, rubs, gallops or clicks. Gastrointestinal system: Soft, nontender, nondistended,  bowel sounds positive. Central nervous system: Alert and oriented. No focal neurological deficits.Symmetric 5 x 5 power. Extremities: No edema, no cyanosis, pulses intact and symmetrical. Skin: No rashes, lesions or ulcers Psychiatry: Judgement and insight appear normal. Mood & affect appropriate.    DVT prophylaxis: Lovenox Code Status: Full Family Communication: No family at bedside Disposition Plan: Most likely be home tomorrow.  Consultants:   None  Procedures:  Antimicrobials:   Data Reviewed: I have personally reviewed following labs and imaging studies  CBC: Recent Labs  Lab 11/25/19 2149 11/26/19 0401  WBC 17.4* 16.0*  NEUTROABS 14.8*  --   HGB 15.2 13.9  HCT 44.5 40.1  MCV 91.8 89.9  PLT 415* 368   Basic Metabolic Panel: Recent Labs  Lab 11/25/19 2335 11/26/19 0401 11/26/19 0957 11/27/19 0427  NA 144 144 144 135  K 4.9 4.4 4.0 3.8  CL 105 107 109 105  CO2 20* 26 25 23   GLUCOSE 306* 154* 103* 134*  BUN 59* 61* 59* 42*  CREATININE 2.00* 1.72* 1.49* 1.00  CALCIUM 9.8 9.9 9.7 8.9   GFR: Estimated Creatinine Clearance: 69.4 mL/min (by C-G formula based on SCr of 1 mg/dL). Liver Function Tests: Recent Labs  Lab 11/25/19 2335  AST 15  ALT 15  ALKPHOS 102  BILITOT 1.5*  PROT 7.5  ALBUMIN 4.2   Recent Labs  Lab 11/25/19 2335  LIPASE 25   No results for input(s): AMMONIA in the last 168 hours. Coagulation Profile: No results for input(s): INR, PROTIME in the last 168 hours. Cardiac Enzymes: No results for input(s): CKTOTAL, CKMB, CKMBINDEX, TROPONINI in the last 168 hours. BNP (last 3 results) No results for input(s): PROBNP in the last 8760 hours. HbA1C: Recent Labs    11/26/19 1953  HGBA1C 12.2*   CBG: Recent Labs  Lab 11/27/19 0208 11/27/19 0737 11/27/19 1118 11/27/19 1402 11/27/19 1606  GLUCAP 91 233* 528* 246* 96   Lipid Profile: No results for input(s): CHOL, HDL, LDLCALC, TRIG, CHOLHDL, LDLDIRECT in the last 72  hours. Thyroid Function Tests: No results for input(s): TSH, T4TOTAL, FREET4, T3FREE, THYROIDAB in the last 72 hours. Anemia Panel: No results for input(s): VITAMINB12, FOLATE, FERRITIN, TIBC, IRON, RETICCTPCT in the last 72 hours. Sepsis Labs: No results for input(s): PROCALCITON, LATICACIDVEN in the last 168 hours.  Recent Results (from the past 240 hour(s))  Respiratory Panel by RT PCR (Flu A&B, Covid) - Nasopharyngeal Swab     Status: None   Collection Time: 11/26/19  2:37 AM   Specimen: Nasopharyngeal Swab  Result Value Ref Range Status   SARS Coronavirus 2 by RT PCR NEGATIVE NEGATIVE Final    Comment: (NOTE) SARS-CoV-2 target nucleic acids are NOT DETECTED. The SARS-CoV-2 RNA is generally detectable in upper respiratoy specimens during the acute phase of infection. The lowest concentration of SARS-CoV-2 viral copies this assay can detect is 131 copies/mL. A negative result does not preclude SARS-Cov-2 infection and should not be used as the sole basis for treatment or other patient management decisions. A negative result may occur with  improper specimen collection/handling, submission of specimen other than nasopharyngeal swab, presence of viral mutation(s) within the areas targeted by this assay, and inadequate number of viral copies (<131 copies/mL). A negative result must be combined with clinical observations, patient history, and epidemiological information. The expected result is Negative. Fact Sheet for Patients:  https://www.moore.com/ Fact Sheet for Healthcare Providers:  https://www.young.biz/ This test is not yet ap proved or cleared by the [place] FDA and  has been authorized for detection and/or diagnosis of SARS-CoV-2 by FDA under an Emergency Use Authorization (EUA). This EUA will remain  in effect (meaning this test can be used) for the duration of the COVID-19 declaration under Section 564(b)(1) of the Act, 21  U.S.C. section 360bbb-3(b)(1), unless the authorization is terminated or revoked sooner.    Influenza A by PCR NEGATIVE NEGATIVE Final   Influenza B by PCR NEGATIVE NEGATIVE Final    Comment: (NOTE) The Xpert Xpress SARS-CoV-2/FLU/RSV assay is intended as an aid in  the diagnosis of influenza from Nasopharyngeal swab specimens and  should not be used as a sole basis for treatment. Nasal washings and  aspirates are unacceptable for Xpert Xpress SARS-CoV-2/FLU/RSV  testing. Fact Sheet for Patients: https://www.moore.com/ Fact Sheet for Healthcare Providers: https://www.young.biz/ This test is not yet approved or cleared by the [place] FDA and  has been authorized for detection and/or diagnosis of SARS-CoV-2 by  FDA under an Emergency Use Authorization (EUA). This EUA will remain  in effect (meaning this test can be used) for the duration of the  Covid-19 declaration under Section 564(b)(1) of the Act, 21  U.S.C. section 360bbb-3(b)(1), unless the authorization is  terminated or revoked. Performed at [facility], [address] [place], [place] [address]   MRSA PCR Screening     Status: None   Collection Time:  11/26/19  6:04 AM   Specimen: Nasopharyngeal  Result Value Ref Range Status   MRSA by PCR NEGATIVE NEGATIVE Final    Comment:        The GeneXpert MRSA Assay (FDA approved for NASAL specimens only), is one component of a comprehensive MRSA colonization surveillance program. It is not intended to diagnose MRSA infection nor to guide or monitor treatment for MRSA infections. Performed at [facility], [address] [place], [place] [address]      Radiology Studies: DG Chest Portable 1 View  Result Date: 11/25/2019 CLINICAL DATA:  Weakness EXAM: PORTABLE CHEST 1 VIEW COMPARISON:  10/24/2019 FINDINGS: The heart size and mediastinal contours are within normal limits. Both lungs are clear. The visualized  skeletal structures are unremarkable. IMPRESSION: No active disease. Electronically Signed   By: Ulyses Jarred M.D.   On: 11/25/2019 21:56    Scheduled Meds: . Chlorhexidine Gluconate Cloth  6 each Topical Daily  . enoxaparin (LOVENOX) injection  40 mg Subcutaneous Q24H  . feeding supplement (NEPRO CARB STEADY)  237 mL Oral TID BM  . insulin aspart  0-5 Units Subcutaneous QHS  . insulin aspart  0-9 Units Subcutaneous TID WC  . insulin glargine  15 Units Subcutaneous BID  . [START ON 11/28/2019] multivitamin with minerals  1 tablet Oral Daily   Continuous Infusions:   LOS: 1 day   Time spent: 35 minutes  Lorella Nimrod, MD [facility] Pager [phone]  If 7PM-7AM, please contact night-coverage [email or web address] Password [facility] 11/27/2019, 7:13 PM   This record has been created using Dragon voice recognition software. Errors have been sought and corrected,but may not always be located. Such creation errors do not reflect on the standard of care.

## 2019-11-27 NOTE — Progress Notes (Signed)
Inpatient Diabetes Program Recommendations  AACE/ADA: New Consensus Statement on Inpatient Glycemic Control   Target Ranges:  Prepandial:   less than 140 mg/dL      Peak postprandial:   less than 180 mg/dL (1-2 hours)      Critically ill patients:  140 - 180 mg/dL  Results for MARTEL, GALVAN (MRN [id number]) as of 11/27/2019 12:12  Ref. Range 11/27/2019 02:08 11/27/2019 07:37 11/27/2019 11:18 11/27/2019 14:02  Glucose-Capillary Latest Ref Range: 70 - 99 mg/dL 91 233 (H) 528 (HH) 246 (H)   Results for JOSEGUADALUPE, STAN (MRN [id number]) as of 11/27/2019 12:12  Ref. Range 11/26/2019 07:45 11/26/2019 11:45 11/26/2019 16:36 11/26/2019 18:08 11/26/2019 19:30 11/26/2019 20:36 11/26/2019 21:28 11/26/2019 22:28  Glucose-Capillary Latest Ref Range: 70 - 99 mg/dL 118 (H) 98  Levemir 5 units @ 11:47 350 (H) 272 (H) 98 70 91 79   Results for KORBIN, MAPPS (MRN [id number]) as of 11/27/2019 12:12  Ref. Range 09/12/2019 21:43 11/26/2019 19:53  Hemoglobin A1C Latest Ref Range: 4.8 - 5.6 % 13.6 (H) 12.2 (H)   Review of Glycemic Control   Outpatient Diabetes medications: Lantus 22 units QAM, Novolog 0-20 units 2-3 times a day (dose depends on glucose), Metformin 1000 mg daily, Januvia 50 mg daily Current orders for Inpatient glycemic control: Lantus 15 units BID, Novolog 0-9 units TID with meals, Novolog 0-5 units QHS  Inpatient Diabetes Program Recommendations:   Insulin-Basal: Please consider changing Lantus to 15 units daily to start 11/28/19 since patient received Lantus 15 units today at 11:17.  Insulin-Meal Coverage: Please consider ordering Novolog 3 units TID with meals for meal coverage if patient eats at least 50% of meals.  NOTE:  Patient is well known to inpatient diabetes team.Patient was admitted 08/07/19, 08/19/19, 09/04/19, 09/12/19, and 10/24/19.  Last seen by diabetes coordinator on 08/08/19 and it was recommended adjusting outpatient Novolog to similar regimen used as an inpatient to help improve outpatient  DM control and encouraged patient to re-establish care with Endocrinologist.Noted patient cocaine positivein past (07/27/19, 08/07/19, 08/19/19, 09/04/19, and 09/13/19)whichmayeffect patient's ability and willingness to manage DM.  Spoke with patient over the phone regarding DM control and patient reports that he has been taking DM medications as prescribed; notes he takes Lantus 22 units QAM, Novolog BID (in the morning and afternoon) and takes it a third time in the evening if needed based on glucose, and Metformin 1000 mg daily, Inquired about Januvia 50 mg daily and patient stated he has so many pills to take he can't remember the name of them all but if he is suppose to be taking it then he is. Patient reports that he is checking glucose several times a day and that it had been doing well and all of a sudden it has gotten out of control. Patient states when his glucose was doing well it was 120-170's mg/dl but notes that if he forgets his insulin or eats bad then his glucose goes way up and he has to take more insulin and checks glucose every hour until it comes down. Patient states he doesn't know what happened this time, he just remembers his sister saying he was acting right and the next thing he knew EMS was there at the house. Discussed A1C 12.2% on 11/26/19 and explained that current A1C indicates an average glucose of 303 mg/dl. Patient stated he does not see his glucose that high very much unless he forgets insulin or eats badly. Discussed A1C and glucose goals and  importance of getting DM under better control to prevent further complications from uncontrolled DM. Patient states that he does not have a PCP because Dr. Ellsworth Lennox has refused to see him or talk to him. Patient states that he use to see an Endocrinologist but she left the area but he is willing to see an Endocrinologist and requested attending MD make a referral to [facility]. Informed patient that I would make  attending MD aware of his request but he may need to call himself to get established there. Added contact phone number for [facility] on discharge notes in chart. Consulted TOC to provide list of providers in the area taking new Medicaid patients.  Patient reports that he has everything he needs at home for DM management. Encouraged patient to get established with PCP and Endocrinologist. Patient verbalized understanding of information discussed and states he has no questions at this time.   Thanks, Orlando Penner, RN, MSN, CDE Diabetes Coordinator Inpatient Diabetes Program [phone] (Team Pager from 8am to 5pm)

## 2019-11-27 NOTE — TOC Initial Note (Signed)
Transition of Care [facility]) - Initial/Assessment Note    Patient Details  Name: Spencer [name] MRN: [id number] Date of Birth: Jul 04, 1961  Transition of Care [facility]) CM/SW Contact:    Trenton Founds, RN Phone Number: 11/27/2019, 3:07 PM  Clinical Narrative:    RNCM completed phone assessment with patient's sister with permission of patient who reports she is working on gaining more control of what is going on with patient as he is not doing very well taking care of things and remains non-compliant. Sister reports that she is trying to get patient set up with a new PCP because she was informed that PCP "fired" him. Discussed with sister that typically primary care is arranged by Medicaid and it would be a good idea to get in contact with them for further guidance Sister reports that she is working towards helping patient as much as she can.          Expected Discharge Plan: Home/Self Care Barriers to Discharge: Continued Medical Work up   Patient Goals and CMS Choice Patient states their goals for this hospitalization and ongoing recovery are:: per sister to help him become more compliant      Expected Discharge Plan and Services Expected Discharge Plan: Home/Self Care   Discharge Planning Services: CM Consult   Living arrangements for the past 2 months: Apartment                                      Prior Living Arrangements/Services Living arrangements for the past 2 months: Apartment Lives with:: Self Patient language and need for interpreter reviewed:: Yes        Need for Family Participation in Patient Care: Yes (Comment) Care giver support system in place?: Yes (comment)   Criminal Activity/Legal Involvement Pertinent to Current Situation/Hospitalization: No - Comment as needed  Activities of Daily Living Home Assistive Devices/Equipment: None ADL Screening (condition at time of admission) Patient's cognitive ability adequate to safely complete daily activities?:  Yes Is the patient deaf or have difficulty hearing?: No Does the patient have difficulty seeing, even when wearing glasses/contacts?: No Does the patient have difficulty concentrating, remembering, or making decisions?: No Patient able to express need for assistance with ADLs?: No Does the patient have difficulty dressing or bathing?: No Independently performs ADLs?: No Communication: Independent Dressing (OT): Independent Grooming: Independent Does the patient have difficulty walking or climbing stairs?: No Weakness of Legs: Right Weakness of Arms/Hands: None  Permission Sought/Granted Permission sought to share information with : Family Supports    Share Information with NAME: Halina Andreas           Emotional Assessment Appearance:: (assessment completed by telephone) Attitude/Demeanor/Rapport: Engaged          Admission diagnosis:  DKA (diabetic ketoacidoses) (HCC) [E11.10] Hyperglycemia [R73.9] Patient Active Problem List   Diagnosis Date Noted  . Tobacco abuse counseling   . Atrial fibrillation with RVR (HCC) 08/07/2019  . GERD (gastroesophageal reflux disease) 08/07/2019  . Acute renal failure (HCC)   . Hyperkalemia   . Sepsis (HCC) 06/09/2019  . AKI (acute kidney injury) (HCC) 02/19/2019  . Hypoglycemia 10/23/2018  . Chronic low back pain 01/16/2017  . Chronic neck pain 01/16/2017  . Diabetic neuropathy (HCC) 01/16/2017  . DM2 (diabetes mellitus, type 2) (HCC) 01/16/2017  . History of cocaine abuse (HCC) 01/16/2017  . Personal history of subdural hematoma 01/16/2017  . Closed displaced fracture  of body of left calcaneus with delayed healing 12/13/2016  . Protein-calorie malnutrition, severe 11/08/2016  . DKA, type 2 ([place]) 11/06/2016  . DKA (diabetic ketoacidoses) ([place]) 04/03/2015  . Hypertension 04/03/2015  . Depression 04/03/2015  . Opiate dependence ([place]) 04/03/2015  . Tobacco abuse 04/03/2015  . Lumbosacral neuritis 07/19/2014  . Pain of finger  of right hand 07/19/2014  . Type II or unspecified type diabetes mellitus without mention of complication, not stated as uncontrolled 07/19/2014  . Knee pain 09/12/2013  . Hernia of flank 09/20/2012  . Epidermoid cyst of skin 09/20/2012  . Chronic pain of both shoulders 03/27/2012   PCP:  Jodi Marble, MD Pharmacy:   [facility] [place], [place] - [address] AT [facility] [place] [address] Phone: [phone] Fax: [phone]     Social Determinants of Health (SDOH) Interventions    Readmission Risk Interventions Readmission Risk Prevention Plan 11/27/2019 09/15/2019 09/05/2019  Transportation Screening Complete Complete -  PCP or Specialist Appt within 3-5 Days - - -  HRI or [place] or Home Care Consult comments - - -  Palliative Care Screening - - -  Medication Review (RN Care Manager) Complete Complete -  PCP or Specialist appointment within 3-5 days of discharge Patient refused (No Data) Complete  HRI or [place] - Patient refused -  SW Recovery Care/Counseling Consult - - -  SW Consult Not Complete Comments - - -  Palliative Care Screening Not Applicable Not Applicable Not [place] Not Applicable Not Applicable Not Applicable  Some recent data might be hidden

## 2019-11-27 NOTE — Discharge Instructions (Signed)
Please call [facility] [phone]) to see if you can establish care with one of the Endocrinologist there (Dr. Tedd Sias, Dr. Rise Paganini, NP).

## 2019-11-28 LAB — BASIC METABOLIC PANEL
Anion gap: 7 (ref 5–15)
BUN: 27 mg/dL — ABNORMAL HIGH (ref 6–20)
CO2: 26 mmol/L (ref 22–32)
Calcium: 9 mg/dL (ref 8.9–10.3)
Chloride: 106 mmol/L (ref 98–111)
Creatinine, Ser: 0.8 mg/dL (ref 0.61–1.24)
GFR calc Af Amer: >60 mL/min (ref >60)
GFR calc non Af Amer: >60 mL/min (ref >60)
Glucose, Bld: 157 mg/dL — ABNORMAL HIGH (ref 70–99)
Potassium: 4 mmol/L (ref 3.5–5.1)
Sodium: 139 mmol/L (ref 135–145)

## 2019-11-28 LAB — GLUCOSE, CAPILLARY
Glucose-Capillary: 173 mg/dL — ABNORMAL HIGH (ref 70–99)
Glucose-Capillary: 244 mg/dL — ABNORMAL HIGH (ref 70–99)
Glucose-Capillary: 336 mg/dL — ABNORMAL HIGH (ref 70–99)

## 2019-11-28 MED ORDER — INSULIN GLARGINE 100 UNITS/ML SOLOSTAR PEN: 18.0000 [IU] | PEN_INJECTOR | Freq: Two times a day (BID) | SUBCUTANEOUS | 11 refills | Status: DC

## 2019-11-28 NOTE — Progress Notes (Signed)
Received MD order to discharge patient to home, reviewed home meds, discharge instructions and follow up appointments with patient and patient verbalized understanding  

## 2019-11-28 NOTE — Discharge Summary (Signed)
Physician Discharge Summary  Spencer [name] WIO:[id number] DOB: Aug 29, 1961 DOA: 11/25/2019  PCP: Jodi Marble, MD  Admit date: 11/25/2019 Discharge date: 11/28/2019  Admitted From: Home Disposition: Home  Recommendations for Outpatient Follow-up:  1. Follow up with PCP in 1-2 weeks 2. Follow-up with endocrinologist. 3. Please obtain BMP/CBC in one week 4. Please follow up on the following pending results: None  Home Health: Yes Equipment/Devices: None Discharge Condition: Stable CODE STATUS: Full Diet recommendation: Heart Healthy / Carb Modified   Brief/Interim Summary: Spencer [name] a58 y.o.Caucasian malewith a known history of coronary status post CABG, type II diabetes mellitus, hypertension, substance abuse and GERD, who presented to the emergency room with acute onset of malaise and fatigue over the last couple of days. Found to be in DKA-initially treated with insulin gtt. then switched to Lantus and SSI. Patient has not seen his PCP for 2-year and would like to get a new PCP and endocrinologist.  Patient has Medicaid and sister is well aware of working with them to get him a new PCP and endocrinologist.  We made some changes to his existing regimen.  His PCP should be able to manage further care.  He will continue with aspirin, no chest pain or cardiac issues during current hospitalization  Discharge Diagnoses:  Active Problems:   DKA (diabetic ketoacidoses) ([place])   Hyperglycemia  Discharge Instructions  Discharge Instructions    Diet - low sodium heart healthy   Complete by: As directed    Discharge instructions   Complete by: As directed    It was pleasure taking care of you. We increased the dose of Lantus to 18 units twice daily. Continue using NovoLog as you were using it before according to your blood sugar level. Continue rest of your medications and follow-up with your PCP for further management.   Increase activity slowly   Complete by: As directed       Allergies as of 11/28/2019   No Known Allergies     Medication List    TAKE these medications   aspirin EC 81 MG tablet Take 1 tablet (81 mg total) by mouth daily.   blood glucose meter kit and supplies Kit Dispense based on patient and insurance preference. Use up to four times daily as directed. (FOR ICD-9 250.00, 250.01).   insulin glargine 100 unit/mL Sopn Commonly known as: LANTUS Inject 0.18 mLs (18 Units total) into the skin 2 (two) times daily. What changed:   how much to take  when to take this   metFORMIN 1000 MG tablet Commonly known as: GLUCOPHAGE Take 1,000 mg by mouth 2 (two) times daily.   NovoLOG FlexPen 100 UNIT/ML FlexPen Generic drug: insulin aspart Inject 0-30 Units into the skin 3 (three) times daily.   pantoprazole 40 MG tablet Commonly known as: PROTONIX Take 40 mg by mouth daily.   sitaGLIPtin 50 MG tablet Commonly known as: JANUVIA Take 50 mg by mouth daily.       No Known Allergies  Consultations:  None  Procedures/Studies: DG Chest Portable 1 View  Result Date: 11/25/2019 CLINICAL DATA:  Weakness EXAM: PORTABLE CHEST 1 VIEW COMPARISON:  10/24/2019 FINDINGS: The heart size and mediastinal contours are within normal limits. Both lungs are clear. The visualized skeletal structures are unremarkable. IMPRESSION: No active disease. Electronically Signed   By: Ulyses Jarred M.D.   On: 11/25/2019 21:56   Subjective: Patient was feeling better when seen this morning.  We discussed regarding getting a new PCP and endocrinologist.  Discharge Exam: Vitals:   11/28/19 0032 11/28/19 0851  BP: 132/87 115/76  Pulse: 67 76  Resp: 18 18  Temp: (!) 97.4 F (36.3 C) 97.7 F (36.5 C)  SpO2: 99% 99%   Vitals:   11/27/19 2300 11/28/19 0027 11/28/19 0032 11/28/19 0851  BP: 116/80  132/87 115/76  Pulse: 69  67 76  Resp: 18  18 18   Temp:   (!) 97.4 F (36.3 C) 97.7 F (36.5 C)  TempSrc:   Oral Oral  SpO2: 96%  99% 99%  Weight:  65.5  kg    Height:  6' (1.829 m)      General: Pt is alert, awake, not in acute distress Cardiovascular: RRR, S1/S2 +, no rubs, no gallops Respiratory: CTA bilaterally, no wheezing, no rhonchi Abdominal: Soft, NT, ND, bowel sounds + Extremities: no edema, no cyanosis   The results of significant diagnostics from this hospitalization (including imaging, microbiology, ancillary and laboratory) are listed below for reference.    Microbiology: Recent Results (from the past 240 hour(s))  Respiratory Panel by RT PCR (Flu A&B, Covid) - Nasopharyngeal Swab     Status: None   Collection Time: 11/26/19  2:37 AM   Specimen: Nasopharyngeal Swab  Result Value Ref Range Status   SARS Coronavirus 2 by RT PCR NEGATIVE NEGATIVE Final    Comment: (NOTE) SARS-CoV-2 target nucleic acids are NOT DETECTED. The SARS-CoV-2 RNA is generally detectable in upper respiratoy specimens during the acute phase of infection. The lowest concentration of SARS-CoV-2 viral copies this assay can detect is 131 copies/mL. A negative result does not preclude SARS-Cov-2 infection and should not be used as the sole basis for treatment or other patient management decisions. A negative result may occur with  improper specimen collection/handling, submission of specimen other than nasopharyngeal swab, presence of viral mutation(s) within the areas targeted by this assay, and inadequate number of viral copies (<131 copies/mL). A negative result must be combined with clinical observations, patient history, and epidemiological information. The expected result is Negative. Fact Sheet for Patients:  PinkCheek.be Fact Sheet for Healthcare Providers:  GravelBags.it This test is not yet ap proved or cleared by the [place] FDA and  has been authorized for detection and/or diagnosis of SARS-CoV-2 by FDA under an Emergency Use Authorization (EUA). This EUA will remain  in  effect (meaning this test can be used) for the duration of the COVID-19 declaration under Section 564(b)(1) of the Act, 21 U.S.C. section 360bbb-3(b)(1), unless the authorization is terminated or revoked sooner.    Influenza A by PCR NEGATIVE NEGATIVE Final   Influenza B by PCR NEGATIVE NEGATIVE Final    Comment: (NOTE) The Xpert Xpress SARS-CoV-2/FLU/RSV assay is intended as an aid in  the diagnosis of influenza from Nasopharyngeal swab specimens and  should not be used as a sole basis for treatment. Nasal washings and  aspirates are unacceptable for Xpert Xpress SARS-CoV-2/FLU/RSV  testing. Fact Sheet for Patients: PinkCheek.be Fact Sheet for Healthcare Providers: GravelBags.it This test is not yet approved or cleared by the [place] FDA and  has been authorized for detection and/or diagnosis of SARS-CoV-2 by  FDA under an Emergency Use Authorization (EUA). This EUA will remain  in effect (meaning this test can be used) for the duration of the  Covid-19 declaration under Section 564(b)(1) of the Act, 21  U.S.C. section 360bbb-3(b)(1), unless the authorization is  terminated or revoked. Performed at [facility], [address], [place], [place] [address]  MRSA PCR Screening     Status: None   Collection Time: 11/26/19  6:04 AM   Specimen: Nasopharyngeal  Result Value Ref Range Status   MRSA by PCR NEGATIVE NEGATIVE Final    Comment:        The GeneXpert MRSA Assay (FDA approved for NASAL specimens only), is one component of a comprehensive MRSA colonization surveillance program. It is not intended to diagnose MRSA infection nor to guide or monitor treatment for MRSA infections. Performed at [facility], [address] [place], [place] [address]      Labs: BNP (last 3 results) Recent Labs    07/27/19 1741 08/06/19 2252  BNP 122.0* 585.9*   Basic Metabolic Panel: Recent Labs   Lab 11/25/19 2335 11/26/19 0401 11/26/19 0957 11/27/19 0427 11/28/19 0541  NA 144 144 144 135 139  K 4.9 4.4 4.0 3.8 4.0  CL 105 107 109 105 106  CO2 20* 26 25 23 26   GLUCOSE 306* 154* 103* 134* 157*  BUN 59* 61* 59* 42* 27*  CREATININE 2.00* 1.72* 1.49* 1.00 0.80  CALCIUM 9.8 9.9 9.7 8.9 9.0   Liver Function Tests: Recent Labs  Lab 11/25/19 2335  AST 15  ALT 15  ALKPHOS 102  BILITOT 1.5*  PROT 7.5  ALBUMIN 4.2   Recent Labs  Lab 11/25/19 2335  LIPASE 25   No results for input(s): AMMONIA in the last 168 hours. CBC: Recent Labs  Lab 11/25/19 2149 11/26/19 0401  WBC 17.4* 16.0*  NEUTROABS 14.8*  --   HGB 15.2 13.9  HCT 44.5 40.1  MCV 91.8 89.9  PLT 415* 368   Cardiac Enzymes: No results for input(s): CKTOTAL, CKMB, CKMBINDEX, TROPONINI in the last 168 hours. BNP: Invalid input(s): POCBNP CBG: Recent Labs  Lab 11/27/19 1402 11/27/19 1606 11/27/19 2202 11/28/19 0037 11/28/19 0805  GLUCAP 246* 96 492* 336* 173*   D-Dimer No results for input(s): DDIMER in the last 72 hours. Hgb A1c Recent Labs    11/26/19 1953  HGBA1C 12.2*   Lipid Profile No results for input(s): CHOL, HDL, LDLCALC, TRIG, CHOLHDL, LDLDIRECT in the last 72 hours. Thyroid function studies No results for input(s): TSH, T4TOTAL, T3FREE, THYROIDAB in the last 72 hours.  Invalid input(s): FREET3 Anemia work up No results for input(s): VITAMINB12, FOLATE, FERRITIN, TIBC, IRON, RETICCTPCT in the last 72 hours. Urinalysis    Component Value Date/Time   COLORURINE YELLOW (A) 11/25/2019 2150   APPEARANCEUR HAZY (A) 11/25/2019 2150   LABSPEC 1.018 11/25/2019 2150   PHURINE 5.0 11/25/2019 2150   GLUCOSEU >=500 (A) 11/25/2019 2150   HGBUR NEGATIVE 11/25/2019 2150   BILIRUBINUR NEGATIVE 11/25/2019 2150   KETONESUR 80 (A) 11/25/2019 2150   PROTEINUR 30 (A) 11/25/2019 2150   NITRITE NEGATIVE 11/25/2019 2150   LEUKOCYTESUR NEGATIVE 11/25/2019 2150   Sepsis Labs Invalid input(s):  PROCALCITONIN,  WBC,  LACTICIDVEN Microbiology Recent Results (from the past 240 hour(s))  Respiratory Panel by RT PCR (Flu A&B, Covid) - Nasopharyngeal Swab     Status: None   Collection Time: 11/26/19  2:37 AM   Specimen: Nasopharyngeal Swab  Result Value Ref Range Status   SARS Coronavirus 2 by RT PCR NEGATIVE NEGATIVE Final    Comment: (NOTE) SARS-CoV-2 target nucleic acids are NOT DETECTED. The SARS-CoV-2 RNA is generally detectable in upper respiratoy specimens during the acute phase of infection. The lowest concentration of SARS-CoV-2 viral copies this assay can detect is 131 copies/mL. A negative result does not preclude  SARS-Cov-2 infection and should not be used as the sole basis for treatment or other patient management decisions. A negative result may occur with  improper specimen collection/handling, submission of specimen other than nasopharyngeal swab, presence of viral mutation(s) within the areas targeted by this assay, and inadequate number of viral copies (<131 copies/mL). A negative result must be combined with clinical observations, patient history, and epidemiological information. The expected result is Negative. Fact Sheet for Patients:  PinkCheek.be Fact Sheet for Healthcare Providers:  GravelBags.it This test is not yet ap proved or cleared by the [place] FDA and  has been authorized for detection and/or diagnosis of SARS-CoV-2 by FDA under an Emergency Use Authorization (EUA). This EUA will remain  in effect (meaning this test can be used) for the duration of the COVID-19 declaration under Section 564(b)(1) of the Act, 21 U.S.C. section 360bbb-3(b)(1), unless the authorization is terminated or revoked sooner.    Influenza A by PCR NEGATIVE NEGATIVE Final   Influenza B by PCR NEGATIVE NEGATIVE Final    Comment: (NOTE) The Xpert Xpress SARS-CoV-2/FLU/RSV assay is intended as an aid in  the  diagnosis of influenza from Nasopharyngeal swab specimens and  should not be used as a sole basis for treatment. Nasal washings and  aspirates are unacceptable for Xpert Xpress SARS-CoV-2/FLU/RSV  testing. Fact Sheet for Patients: PinkCheek.be Fact Sheet for Healthcare Providers: GravelBags.it This test is not yet approved or cleared by the [place] FDA and  has been authorized for detection and/or diagnosis of SARS-CoV-2 by  FDA under an Emergency Use Authorization (EUA). This EUA will remain  in effect (meaning this test can be used) for the duration of the  Covid-19 declaration under Section 564(b)(1) of the Act, 21  U.S.C. section 360bbb-3(b)(1), unless the authorization is  terminated or revoked. Performed at [facility], [address] [place], [place] [address]   MRSA PCR Screening     Status: None   Collection Time: 11/26/19  6:04 AM   Specimen: Nasopharyngeal  Result Value Ref Range Status   MRSA by PCR NEGATIVE NEGATIVE Final    Comment:        The GeneXpert MRSA Assay (FDA approved for NASAL specimens only), is one component of a comprehensive MRSA colonization surveillance program. It is not intended to diagnose MRSA infection nor to guide or monitor treatment for MRSA infections. Performed at [facility], [address] [place], [place] [address]     Time coordinating discharge: Over 30 minutes  SIGNED:  Lorella Nimrod, MD  [facility] 11/28/2019, 11:06 AM Pager [phone]  If 7PM-7AM, please contact night-coverage [email or web address] Password [email or web address]  This record has been created using Systems analyst. Errors have been sought and corrected,but may not always be located. Such creation errors do not reflect on the standard of care.

## 2019-12-15 ENCOUNTER — Inpatient Hospital Stay
Admission: EM | Admit: 2019-12-15 | Discharge: 2019-12-17 | DRG: 871 | Disposition: A | Discharge status: Home / Self Care | Payer: Medicaid Other | Attending: Internal Medicine | Admitting: Internal Medicine

## 2019-12-15 ENCOUNTER — Other Ambulatory Visit: Payer: Self-pay

## 2019-12-15 ENCOUNTER — Inpatient Hospital Stay: Payer: Medicaid Other

## 2019-12-15 DIAGNOSIS — J984 Other disorders of lung: Secondary | ICD-10-CM

## 2019-12-15 DIAGNOSIS — Z7982 Long term (current) use of aspirin: Secondary | ICD-10-CM

## 2019-12-15 DIAGNOSIS — A419 Sepsis, unspecified organism: Secondary | ICD-10-CM | POA: Diagnosis present

## 2019-12-15 DIAGNOSIS — R531 Weakness: Secondary | ICD-10-CM | POA: Diagnosis not present

## 2019-12-15 DIAGNOSIS — I10 Essential (primary) hypertension: Secondary | ICD-10-CM | POA: Diagnosis present

## 2019-12-15 DIAGNOSIS — F1510 Other stimulant abuse, uncomplicated: Secondary | ICD-10-CM | POA: Diagnosis present

## 2019-12-15 DIAGNOSIS — I482 Chronic atrial fibrillation, unspecified: Secondary | ICD-10-CM | POA: Diagnosis present

## 2019-12-15 DIAGNOSIS — Z8249 Family history of ischemic heart disease and other diseases of the circulatory system: Secondary | ICD-10-CM

## 2019-12-15 DIAGNOSIS — K219 Gastro-esophageal reflux disease without esophagitis: Secondary | ICD-10-CM | POA: Diagnosis present

## 2019-12-15 DIAGNOSIS — Z23 Encounter for immunization: Secondary | ICD-10-CM

## 2019-12-15 DIAGNOSIS — Z794 Long term (current) use of insulin: Secondary | ICD-10-CM | POA: Diagnosis not present

## 2019-12-15 DIAGNOSIS — E860 Dehydration: Secondary | ICD-10-CM | POA: Diagnosis present

## 2019-12-15 DIAGNOSIS — E1110 Type 2 diabetes mellitus with ketoacidosis without coma: Secondary | ICD-10-CM | POA: Diagnosis present

## 2019-12-15 DIAGNOSIS — R6520 Severe sepsis without septic shock: Secondary | ICD-10-CM | POA: Diagnosis not present

## 2019-12-15 DIAGNOSIS — G9349 Other encephalopathy: Secondary | ICD-10-CM | POA: Diagnosis present

## 2019-12-15 DIAGNOSIS — E875 Hyperkalemia: Secondary | ICD-10-CM | POA: Diagnosis present

## 2019-12-15 DIAGNOSIS — N281 Cyst of kidney, acquired: Secondary | ICD-10-CM | POA: Diagnosis present

## 2019-12-15 DIAGNOSIS — Z20822 Contact with and (suspected) exposure to covid-19: Secondary | ICD-10-CM | POA: Diagnosis present

## 2019-12-15 DIAGNOSIS — E1310 Other specified diabetes mellitus with ketoacidosis without coma: Secondary | ICD-10-CM

## 2019-12-15 DIAGNOSIS — F17210 Nicotine dependence, cigarettes, uncomplicated: Secondary | ICD-10-CM | POA: Diagnosis present

## 2019-12-15 DIAGNOSIS — G8929 Other chronic pain: Secondary | ICD-10-CM | POA: Diagnosis present

## 2019-12-15 DIAGNOSIS — E1140 Type 2 diabetes mellitus with diabetic neuropathy, unspecified: Secondary | ICD-10-CM | POA: Diagnosis present

## 2019-12-15 DIAGNOSIS — N179 Acute kidney failure, unspecified: Secondary | ICD-10-CM | POA: Diagnosis present

## 2019-12-15 DIAGNOSIS — J189 Pneumonia, unspecified organism: Secondary | ICD-10-CM | POA: Diagnosis present

## 2019-12-15 DIAGNOSIS — R339 Retention of urine, unspecified: Secondary | ICD-10-CM | POA: Diagnosis present

## 2019-12-15 DIAGNOSIS — J181 Lobar pneumonia, unspecified organism: Secondary | ICD-10-CM

## 2019-12-15 DIAGNOSIS — Z9114 Patient's other noncompliance with medication regimen: Secondary | ICD-10-CM | POA: Diagnosis not present

## 2019-12-15 DIAGNOSIS — Z79899 Other long term (current) drug therapy: Secondary | ICD-10-CM

## 2019-12-15 LAB — COMPREHENSIVE METABOLIC PANEL
ALT: 21 U/L (ref 0–44)
AST: 14 U/L — ABNORMAL LOW (ref 15–41)
Albumin: 5.2 g/dL — ABNORMAL HIGH (ref 3.5–5.0)
Alkaline Phosphatase: 125 U/L (ref 38–126)
BUN: 62 mg/dL — ABNORMAL HIGH (ref 6–20)
CO2: <7 mmol/L — ABNORMAL LOW (ref 22–32)
Calcium: 10.2 mg/dL (ref 8.9–10.3)
Chloride: 89 mmol/L — ABNORMAL LOW (ref 98–111)
Creatinine, Ser: 3.19 mg/dL — ABNORMAL HIGH (ref 0.61–1.24)
GFR calc Af Amer: 23 mL/min — ABNORMAL LOW (ref >60)
GFR calc non Af Amer: 20 mL/min — ABNORMAL LOW (ref >60)
Glucose, Bld: 881 mg/dL (ref 70–99)
Potassium: 7.3 mmol/L (ref 3.5–5.1)
Sodium: 132 mmol/L — ABNORMAL LOW (ref 135–145)
Total Bilirubin: 2.1 mg/dL — ABNORMAL HIGH (ref 0.3–1.2)
Total Protein: 8.8 g/dL — ABNORMAL HIGH (ref 6.5–8.1)

## 2019-12-15 LAB — BASIC METABOLIC PANEL
Anion gap: 21 — ABNORMAL HIGH (ref 5–15)
Anion gap: 15 (ref 5–15)
Anion gap: 12 (ref 5–15)
BUN: 64 mg/dL — ABNORMAL HIGH (ref 6–20)
BUN: 60 mg/dL — ABNORMAL HIGH (ref 6–20)
BUN: 57 mg/dL — ABNORMAL HIGH (ref 6–20)
BUN: 55 mg/dL — ABNORMAL HIGH (ref 6–20)
CO2: <7 mmol/L — ABNORMAL LOW (ref 22–32)
CO2: 13 mmol/L — ABNORMAL LOW (ref 22–32)
CO2: 17 mmol/L — ABNORMAL LOW (ref 22–32)
CO2: 19 mmol/L — ABNORMAL LOW (ref 22–32)
Calcium: 9.6 mg/dL (ref 8.9–10.3)
Calcium: 9.4 mg/dL (ref 8.9–10.3)
Calcium: 9.4 mg/dL (ref 8.9–10.3)
Calcium: 9.3 mg/dL (ref 8.9–10.3)
Chloride: 96 mmol/L — ABNORMAL LOW (ref 98–111)
Chloride: 108 mmol/L (ref 98–111)
Chloride: 109 mmol/L (ref 98–111)
Chloride: 113 mmol/L — ABNORMAL HIGH (ref 98–111)
Creatinine, Ser: 3.43 mg/dL — ABNORMAL HIGH (ref 0.61–1.24)
Creatinine, Ser: 2.68 mg/dL — ABNORMAL HIGH (ref 0.61–1.24)
Creatinine, Ser: 2.48 mg/dL — ABNORMAL HIGH (ref 0.61–1.24)
Creatinine, Ser: 1.85 mg/dL — ABNORMAL HIGH (ref 0.61–1.24)
GFR calc Af Amer: 21 mL/min — ABNORMAL LOW (ref >60)
GFR calc Af Amer: 29 mL/min — ABNORMAL LOW (ref >60)
GFR calc Af Amer: 31 mL/min — ABNORMAL LOW (ref >60)
GFR calc Af Amer: 45 mL/min — ABNORMAL LOW (ref >60)
GFR calc non Af Amer: 18 mL/min — ABNORMAL LOW (ref >60)
GFR calc non Af Amer: 25 mL/min — ABNORMAL LOW (ref >60)
GFR calc non Af Amer: 27 mL/min — ABNORMAL LOW (ref >60)
GFR calc non Af Amer: 39 mL/min — ABNORMAL LOW (ref >60)
Glucose, Bld: 911 mg/dL (ref 70–99)
Glucose, Bld: 487 mg/dL — ABNORMAL HIGH (ref 70–99)
Glucose, Bld: 387 mg/dL — ABNORMAL HIGH (ref 70–99)
Glucose, Bld: 177 mg/dL — ABNORMAL HIGH (ref 70–99)
Potassium: 7.3 mmol/L (ref 3.5–5.1)
Potassium: 4.2 mmol/L (ref 3.5–5.1)
Potassium: 4.2 mmol/L (ref 3.5–5.1)
Potassium: 3.9 mmol/L (ref 3.5–5.1)
Sodium: 137 mmol/L (ref 135–145)
Sodium: 142 mmol/L (ref 135–145)
Sodium: 141 mmol/L (ref 135–145)
Sodium: 144 mmol/L (ref 135–145)

## 2019-12-15 LAB — CBC WITH DIFFERENTIAL/PLATELET
Abs Immature Granulocytes: 0.13 10*3/uL — ABNORMAL HIGH (ref 0.00–0.07)
Basophils Absolute: 0.1 10*3/uL (ref 0.0–0.1)
Basophils Relative: 0 %
Eosinophils Absolute: 0 10*3/uL (ref 0.0–0.5)
Eosinophils Relative: 0 %
HCT: 50.2 % (ref 39.0–52.0)
Hemoglobin: 15.4 g/dL (ref 13.0–17.0)
Immature Granulocytes: 1 %
Lymphocytes Relative: 4 %
Lymphs Abs: 0.8 10*3/uL (ref 0.7–4.0)
MCH: 31.5 pg (ref 26.0–34.0)
MCHC: 30.7 g/dL (ref 30.0–36.0)
MCV: 102.7 fL — ABNORMAL HIGH (ref 80.0–100.0)
Monocytes Absolute: 0.8 10*3/uL (ref 0.1–1.0)
Monocytes Relative: 4 %
Neutro Abs: 16.9 10*3/uL — ABNORMAL HIGH (ref 1.7–7.7)
Neutrophils Relative %: 91 %
Platelets: 372 10*3/uL (ref 150–400)
RBC: 4.89 MIL/uL (ref 4.22–5.81)
RDW: 13.5 % (ref 11.5–15.5)
WBC: 18.6 10*3/uL — ABNORMAL HIGH (ref 4.0–10.5)
nRBC: 0 % (ref 0.0–0.2)

## 2019-12-15 LAB — TROPONIN I (HIGH SENSITIVITY)
Troponin I (High Sensitivity): 11 ng/L (ref <18)
Troponin I (High Sensitivity): 9 ng/L (ref <18)
Troponin I (High Sensitivity): 9 ng/L (ref <18)

## 2019-12-15 LAB — GLUCOSE, CAPILLARY
Glucose-Capillary: 139 mg/dL — ABNORMAL HIGH (ref 70–99)
Glucose-Capillary: 141 mg/dL — ABNORMAL HIGH (ref 70–99)
Glucose-Capillary: 166 mg/dL — ABNORMAL HIGH (ref 70–99)
Glucose-Capillary: 204 mg/dL — ABNORMAL HIGH (ref 70–99)
Glucose-Capillary: 273 mg/dL — ABNORMAL HIGH (ref 70–99)
Glucose-Capillary: 310 mg/dL — ABNORMAL HIGH (ref 70–99)
Glucose-Capillary: 319 mg/dL — ABNORMAL HIGH (ref 70–99)
Glucose-Capillary: 385 mg/dL — ABNORMAL HIGH (ref 70–99)
Glucose-Capillary: 462 mg/dL — ABNORMAL HIGH (ref 70–99)
Glucose-Capillary: 539 mg/dL (ref 70–99)
Glucose-Capillary: >600 mg/dL (ref 70–99)
Glucose-Capillary: >600 mg/dL (ref 70–99)
Glucose-Capillary: >600 mg/dL (ref 70–99)
Glucose-Capillary: >600 mg/dL (ref 70–99)
Glucose-Capillary: >600 mg/dL (ref 70–99)

## 2019-12-15 LAB — RESPIRATORY PANEL BY RT PCR (FLU A&B, COVID)
Influenza A by PCR: NEGATIVE
Influenza B by PCR: NEGATIVE
SARS Coronavirus 2 by RT PCR: NEGATIVE

## 2019-12-15 LAB — PROCALCITONIN: Procalcitonin: 0.53 ng/mL

## 2019-12-15 LAB — BETA-HYDROXYBUTYRIC ACID
Beta-Hydroxybutyric Acid: >8 mmol/L — ABNORMAL HIGH (ref 0.05–0.27)
Beta-Hydroxybutyric Acid: 5.79 mmol/L — ABNORMAL HIGH (ref 0.05–0.27)

## 2019-12-15 LAB — MAGNESIUM: Magnesium: 2.8 mg/dL — ABNORMAL HIGH (ref 1.7–2.4)

## 2019-12-15 LAB — PHOSPHORUS: Phosphorus: 3.8 mg/dL (ref 2.5–4.6)

## 2019-12-15 LAB — MRSA PCR SCREENING: MRSA by PCR: NEGATIVE

## 2019-12-15 MED ORDER — HEPARIN SODIUM (PORCINE) 5000 UNIT/ML IJ SOLN
5000.0000 [IU] | Freq: Three times a day (TID) | INTRAMUSCULAR | Status: DC
  Administered 2019-12-15 – 2019-12-16 (×3): 5000 [IU] via SUBCUTANEOUS
  Filled 2019-12-15 (×3): qty 1

## 2019-12-15 MED ORDER — SODIUM CHLORIDE 0.9 % IV SOLN
1.0000 g | Freq: Once | INTRAVENOUS | Status: DC
  Filled 2019-12-15: qty 10

## 2019-12-15 MED ORDER — VANCOMYCIN HCL 1500 MG/300ML IV SOLN
1500.0000 mg | Freq: Once | INTRAVENOUS | Status: AC
  Administered 2019-12-15: 1500 mg via INTRAVENOUS
  Filled 2019-12-15: qty 300

## 2019-12-15 MED ORDER — SODIUM BICARBONATE 8.4 % IV SOLN
25.0000 meq | Freq: Once | INTRAVENOUS | Status: AC
  Administered 2019-12-15: 25 meq via INTRAVENOUS
  Filled 2019-12-15: qty 50

## 2019-12-15 MED ORDER — SODIUM CHLORIDE 0.9 % IV BOLUS
1000.0000 mL | Freq: Once | INTRAVENOUS | Status: AC
  Administered 2019-12-15: 10:00:00 1000 mL via INTRAVENOUS

## 2019-12-15 MED ORDER — STERILE WATER FOR INJECTION IV SOLN: INTRAVENOUS | Status: DC

## 2019-12-15 MED ORDER — LACTATED RINGERS IV SOLN: INTRAVENOUS | Status: DC

## 2019-12-15 MED ORDER — VANCOMYCIN HCL IN DEXTROSE 750-5 MG/150ML-% IV SOLN
750.0000 mg | INTRAVENOUS | Status: DC
  Filled 2019-12-15: qty 150

## 2019-12-15 MED ORDER — INSULIN REGULAR(HUMAN) IN NACL 100-0.9 UT/100ML-% IV SOLN
INTRAVENOUS | Status: DC
  Administered 2019-12-15: 2 [IU]/h via INTRAVENOUS
  Filled 2019-12-15: qty 100

## 2019-12-15 MED ORDER — SODIUM CHLORIDE 0.9 % IV BOLUS
1000.0000 mL | INTRAVENOUS | Status: AC
  Administered 2019-12-15: 1000 mL via INTRAVENOUS

## 2019-12-15 MED ORDER — DEXTROSE 50 % IV SOLN: 0.0000 mL | INTRAVENOUS | Status: DC | PRN

## 2019-12-15 MED ORDER — ASPIRIN EC 81 MG PO TBEC
81.0000 mg | DELAYED_RELEASE_TABLET | Freq: Every day | ORAL | Status: DC
  Administered 2019-12-16 – 2019-12-17 (×2): 81 mg via ORAL
  Filled 2019-12-15 (×3): qty 1

## 2019-12-15 MED ORDER — DEXTROSE-NACL 5-0.45 % IV SOLN: INTRAVENOUS | Status: DC

## 2019-12-15 MED ORDER — INSULIN REGULAR(HUMAN) IN NACL 100-0.9 UT/100ML-% IV SOLN
INTRAVENOUS | Status: DC
  Administered 2019-12-15: 8.5 [IU]/h via INTRAVENOUS
  Filled 2019-12-15: qty 100

## 2019-12-15 MED ORDER — SODIUM CHLORIDE 0.9 % IV SOLN: INTRAVENOUS | Status: DC

## 2019-12-15 MED ORDER — HYDRALAZINE HCL 10 MG PO TABS
10.0000 mg | ORAL_TABLET | Freq: Three times a day (TID) | ORAL | Status: DC | PRN
  Administered 2019-12-16: 13:00:00 10 mg via ORAL
  Filled 2019-12-15 (×3): qty 1

## 2019-12-15 MED ORDER — DEXTROSE IN LACTATED RINGERS 5 % IV SOLN: INTRAVENOUS | Status: DC

## 2019-12-15 MED ORDER — SODIUM CHLORIDE 0.9 % IV SOLN
1.0000 g | INTRAVENOUS | Status: DC
  Administered 2019-12-15: 1 g via INTRAVENOUS
  Filled 2019-12-15 (×2): qty 10

## 2019-12-15 MED ORDER — SODIUM POLYSTYRENE SULFONATE 15 GM/60ML PO SUSP
15.0000 g | Freq: Once | ORAL | Status: DC
  Filled 2019-12-15: qty 60

## 2019-12-15 MED ORDER — LACTATED RINGERS IV BOLUS
1000.0000 mL | INTRAVENOUS | Status: DC
  Administered 2019-12-15: 1000 mL via INTRAVENOUS

## 2019-12-15 MED ORDER — DEXTROSE 5 % IN LACTATED RINGERS IV BOLUS
1000.0000 mL | INTRAVENOUS | Status: DC
  Filled 2019-12-15 (×2): qty 1000

## 2019-12-15 MED ORDER — WATER FOR INJECTION STERILE IV SOLN
INTRAVENOUS | Status: DC
  Filled 2019-12-15: qty 1000

## 2019-12-15 NOTE — ED Notes (Signed)
MD at bedside. 

## 2019-12-15 NOTE — ED Notes (Signed)
Pt given urinal and informed of needing sample when able

## 2019-12-15 NOTE — ED Notes (Addendum)
ED TO INPATIENT HANDOFF REPORT  ED Nurse Name and Phone #: Karena Addison [phone]  S Name/Age/Gender Spencer [name] 59 y.o. male Room/Bed: ED01A/ED01A  Code Status   Code Status: Full Code  Home/SNF/Other Home Patient oriented to: self, place, time and situation Is this baseline? Yes   Triage Complete: Triage complete  Chief Complaint DKA (diabetic ketoacidoses) ([place]) [E11.10]  Triage Note Pt comes via [facility] from home with c/o hyperglycemia. Pt has not taken insulin in several days. EMS states Fire glucometer read HI.  EMS reports VSS, 4 of zofran IM. CBG-351  Pt arrives A*OX4. Pt denies any pain. Pt states some weakness.     Allergies No Known Allergies  Level of Care/Admitting Diagnosis ED Disposition    ED Disposition Condition Comment   Admit  Hospital Area: [place] [[id number]]  Level of Care: Stepdown [14]  Covid Evaluation: Asymptomatic Screening Protocol (No Symptoms)  Diagnosis: DKA (diabetic ketoacidoses) ([place]) [400867]  Admitting Physician: Cherylann Ratel  Attending Physician: Cherylann Ratel  Estimated length of stay: 5 - 7 days  Certification:: I certify this patient will need inpatient services for at least 2 midnights       B Medical/Surgery History Past Medical History:  Diagnosis Date  . Brain aneurysm   . Broken bones    clavical, ankle, arms toes wriast   . Diabetes mellitus without complication ([place])   . Dysrhythmia   . GERD (gastroesophageal reflux disease)   . Hypertension   . Substance abuse [facility])    Past Surgical History:  Procedure Laterality Date  . CORONARY/GRAFT ACUTE MI REVASCULARIZATION N/A 09/04/2019   Procedure: Coronary/Graft Acute MI Revascularization;  Surgeon: Yolonda Kida, MD;  Location: [place] [facility];  Service: Cardiovascular;  Laterality: N/A;  . ESOPHAGOGASTRODUODENOSCOPY (EGD) WITH PROPOFOL N/A 06/04/2019   Procedure: ESOPHAGOGASTRODUODENOSCOPY (EGD) WITH PROPOFOL;  Surgeon:  Toledo, Benay Pike, MD;  Location: [facility] ENDOSCOPY;  Service: Gastroenterology;  Laterality: N/A;  . HERNIA REPAIR    . IMPLANTATION / PLACEMENT OF STRIP ELECTRODES VIA BURR HOLES SUBDURAL     anyrusum  . LEFT HEART CATH AND CORONARY ANGIOGRAPHY N/A 09/04/2019   Procedure: LEFT HEART CATH AND CORONARY ANGIOGRAPHY;  Surgeon: Yolonda Kida, MD;  Location: [place] [facility];  Service: Cardiovascular;  Laterality: N/A;  . NECK SURGERY    . TEE WITHOUT CARDIOVERSION N/A 06/03/2019   Procedure: TRANSESOPHAGEAL ECHOCARDIOGRAM (TEE);  Surgeon: Teodoro Spray, MD;  Location: [facility] ORS;  Service: Cardiovascular;  Laterality: N/A;     A IV Location/Drains/Wounds Patient Lines/Drains/Airways Status   Active Line/Drains/Airways    Name:   Placement date:   Placement time:   Site:   Days:   Peripheral IV 12/15/19 Right Antecubital   12/15/19    0950    Antecubital   less than 1   Peripheral IV 12/15/19 Left Arm   12/15/19    1112    Arm   less than 1          Intake/Output Last 24 hours No intake or output data in the 24 hours ending 12/15/19 1345  Labs/Imaging Results for orders placed or performed during the hospital encounter of 12/15/19 (from the past 48 hour(s))  Blood gas, venous     Status: Abnormal (Preliminary result)   Collection Time: 12/15/19  9:42 AM  Result Value Ref Range   pH, Ven 7.04 (LL) 7.250 - 7.430    Comment: CRITICAL RESULT CALLED TO, READ BACK BY  AND VERIFIED WITH:  NOTIFIED SAMANTHA HAMILTON RN, OF THE CRITICAL RESULTS ON 12/15/2019 AT 1011AM. DW, RRT.    pCO2, Ven PENDING 44.0 - 60.0 mmHg   pO2, Ven 43.0 32.0 - 45.0 mmHg   Bicarbonate 4.6 (L) 20.0 - 28.0 mmol/L   Acid-base deficit 24.3 (H) 0.0 - 2.0 mmol/L   O2 Saturation 53.0 %   Patient temperature 37.0    Collection site VEIN    Sample type VEIN     Comment: Performed at [facility], [address] [place], [place] [address]  Glucose, capillary     Status: Abnormal   Collection Time:  12/15/19  9:42 AM  Result Value Ref Range   Glucose-Capillary >600 (HH) 70 - 99 mg/dL  CBC with Differential     Status: Abnormal   Collection Time: 12/15/19  9:45 AM  Result Value Ref Range   WBC 18.6 (H) 4.0 - 10.5 K/uL   RBC 4.89 4.22 - 5.81 MIL/uL   Hemoglobin 15.4 13.0 - 17.0 g/dL   HCT 85.8 85.0 - 27.7 %   MCV 102.7 (H) 80.0 - 100.0 fL   MCH 31.5 26.0 - 34.0 pg   MCHC 30.7 30.0 - 36.0 g/dL   RDW 41.2 87.8 - 67.6 %   Platelets 372 150 - 400 K/uL   nRBC 0.0 0.0 - 0.2 %   Neutrophils Relative % 91 %   Neutro Abs 16.9 (H) 1.7 - 7.7 K/uL   Lymphocytes Relative 4 %   Lymphs Abs 0.8 0.7 - 4.0 K/uL   Monocytes Relative 4 %   Monocytes Absolute 0.8 0.1 - 1.0 K/uL   Eosinophils Relative 0 %   Eosinophils Absolute 0.0 0.0 - 0.5 K/uL   Basophils Relative 0 %   Basophils Absolute 0.1 0.0 - 0.1 K/uL   Immature Granulocytes 1 %   Abs Immature Granulocytes 0.13 (H) 0.00 - 0.07 K/uL    Comment: Performed at [facility], [address] [place], [place] [address]  Comprehensive metabolic panel     Status: Abnormal   Collection Time: 12/15/19  9:45 AM  Result Value Ref Range   Sodium 132 (L) 135 - 145 mmol/L   Potassium 7.3 (HH) 3.5 - 5.1 mmol/L    Comment: CRITICAL RESULT CALLED TO, READ BACK BY AND VERIFIED WITH SAMANTHA HAMILTON AT 1031 12/15/19 DAS    Chloride 89 (L) 98 - 111 mmol/L   CO2 <7 (L) 22 - 32 mmol/L   Glucose, Bld 881 (HH) 70 - 99 mg/dL    Comment: CRITICAL RESULT CALLED TO, READ BACK BY AND VERIFIED WITH SAMANTHA HAMILTON AT 1031 12/15/19 DAS    BUN 62 (H) 6 - 20 mg/dL   Creatinine, Ser 7.09 (H) 0.61 - 1.24 mg/dL   Calcium 62.8 8.9 - 36.6 mg/dL   Total Protein 8.8 (H) 6.5 - 8.1 g/dL   Albumin 5.2 (H) 3.5 - 5.0 g/dL   AST 14 (L) 15 - 41 U/L   ALT 21 0 - 44 U/L   Alkaline Phosphatase 125 38 - 126 U/L   Total Bilirubin 2.1 (H) 0.3 - 1.2 mg/dL   GFR calc non Af Amer 20 (L) >60 mL/min   GFR calc Af Amer 23 (L) >60 mL/min   Anion gap NOT CALCULATED 5 - 15     Comment: Performed at [facility], [address] [place], [place] [address]  Beta-hydroxybutyric acid     Status: Abnormal   Collection Time: 12/15/19  9:45 AM  Result Value Ref Range   Beta-Hydroxybutyric Acid >8.00 (H) 0.05 - 0.27 mmol/L    Comment: RESULT CONFIRMED BY MANUAL DILUTION. QSD Performed at [facility], [address] [place], [place] [address]   Glucose, capillary     Status: Abnormal   Collection Time: 12/15/19 11:11 AM  Result Value Ref Range   Glucose-Capillary >600 (HH) 70 - 99 mg/dL  Respiratory Panel by RT PCR (Flu A&B, Covid) - Nasopharyngeal Swab     Status: None   Collection Time: 12/15/19 11:39 AM   Specimen: Nasopharyngeal Swab  Result Value Ref Range   SARS Coronavirus 2 by RT PCR NEGATIVE NEGATIVE    Comment: (NOTE) SARS-CoV-2 target nucleic acids are NOT DETECTED. The SARS-CoV-2 RNA is generally detectable in upper respiratoy specimens during the acute phase of infection. The lowest concentration of SARS-CoV-2 viral copies this assay can detect is 131 copies/mL. A negative result does not preclude SARS-Cov-2 infection and should not be used as the sole basis for treatment or other patient management decisions. A negative result may occur with  improper specimen collection/handling, submission of specimen other than nasopharyngeal swab, presence of viral mutation(s) within the areas targeted by this assay, and inadequate number of viral copies (<131 copies/mL). A negative result must be combined with clinical observations, patient history, and epidemiological information. The expected result is Negative. Fact Sheet for Patients:  https://www.moore.com/ Fact Sheet for Healthcare Providers:  https://www.young.biz/ This test is not yet ap proved or cleared by the [place] FDA and  has been authorized for detection and/or diagnosis of SARS-CoV-2 by FDA under an Emergency Use Authorization  (EUA). This EUA will remain  in effect (meaning this test can be used) for the duration of the COVID-19 declaration under Section 564(b)(1) of the Act, 21 U.S.C. section 360bbb-3(b)(1), unless the authorization is terminated or revoked sooner.    Influenza A by PCR NEGATIVE NEGATIVE   Influenza B by PCR NEGATIVE NEGATIVE    Comment: (NOTE) The Xpert Xpress SARS-CoV-2/FLU/RSV assay is intended as an aid in  the diagnosis of influenza from Nasopharyngeal swab specimens and  should not be used as a sole basis for treatment. Nasal washings and  aspirates are unacceptable for Xpert Xpress SARS-CoV-2/FLU/RSV  testing. Fact Sheet for Patients: https://www.moore.com/ Fact Sheet for Healthcare Providers: https://www.young.biz/ This test is not yet approved or cleared by the [place] FDA and  has been authorized for detection and/or diagnosis of SARS-CoV-2 by  FDA under an Emergency Use Authorization (EUA). This EUA will remain  in effect (meaning this test can be used) for the duration of the  Covid-19 declaration under Section 564(b)(1) of the Act, 21  U.S.C. section 360bbb-3(b)(1), unless the authorization is  terminated or revoked. Performed at [facility] [address] [place], [place] [address]   Glucose, capillary     Status: Abnormal   Collection Time: 12/15/19 11:52 AM  Result Value Ref Range   Glucose-Capillary >600 (HH) 70 - 99 mg/dL  Glucose, capillary     Status: Abnormal   Collection Time: 12/15/19  1:03 PM  Result Value Ref Range   Glucose-Capillary >600 (HH) 70 - 99 mg/dL  Glucose, capillary     Status: Abnormal   Collection Time: 12/15/19  1:37 PM  Result Value Ref Range   Glucose-Capillary >600 (HH) 70 - 99 mg/dL   No results found.  Pending Labs [employer] (From admission, onward)    Start     Ordered   12/15/19 1211  Basic metabolic panel  (Diabetes Ketoacidosis (DKA))  STAT Now then every 4 hours ,    STAT     12/15/19 1214   12/15/19 1043  Beta-hydroxybutyric acid  (Diabetes Ketoacidosis (DKA))  Now then every 8 hours,   STAT     12/15/19 1043   12/15/19 0942  Urinalysis, Complete w Microscopic  Once,   STAT     12/15/19 0941          Vitals/Pain Today's Vitals   12/15/19 1130 12/15/19 1140 12/15/19 1300 12/15/19 1330  BP: (!) 163/78  (!) 167/98 (!) 165/93  Pulse: (!) 104  (!) 103 96  Resp: (!) 24     Temp:  97.6 F (36.4 C)    TempSrc:  Axillary    SpO2: 100%  100% 100%  Weight:      Height:      PainSc:        Isolation Precautions No active isolations  Medications Medications  sodium chloride 0.9 % bolus 1,000 mL (0 mLs Intravenous Stopping Infusion hung by another clincian 12/15/19 1136)  calcium chloride 1 g in sodium chloride 0.9 % 100 mL IVPB (has no administration in time range)  aspirin EC tablet 81 mg (has no administration in time range)  heparin injection 5,000 Units (has no administration in time range)  lactated ringers bolus 1,000 mL (has no administration in time range)  sodium polystyrene (KAYEXALATE) 15 GM/60ML suspension 15 g (has no administration in time range)  insulin regular, human (MYXREDLIN) 100 units/ 100 mL infusion (has no administration in time range)  dextrose 50 % solution 0-50 mL (has no administration in time range)  lactated ringers infusion (has no administration in time range)  dextrose 5% lactated ringers bolus 1,000 mL (has no administration in time range)  hydrALAZINE (APRESOLINE) tablet 10 mg (has no administration in time range)  sodium chloride 0.9 % bolus 1,000 mL (0 mLs Intravenous Stopped 12/15/19 1136)  sodium bicarbonate injection 25 mEq (25 mEq Intravenous Given 12/15/19 1201)    Mobility walks with person assist Low fall risk   Focused Assessments    R Recommendations: See Admitting Provider Note  Report given to: Irving Burton, RN

## 2019-12-15 NOTE — H&P (Signed)
History and Physical    PARRISH BONN GYJ:[id number] DOB: Nov 13, 1960 DOA: 12/15/2019   PCP: Jodi Marble, MD    Patient coming from: Home   Chief Complaint: DKA  HPI: Spencer [name] is a 59 y.o. male with medical history significant of DM II h/o DKA,HTN, medical noncompliance comes to [place] with not taking insulin and feels bad.  ED Course:  Blood pressure (!) 163/78, pulse (!) 104, temperature 97.6 F (36.4 C), temperature source Axillary, resp. rate (!) 24, height 6' (1.829 m), weight 65.5 kg, SpO2 100 %. Pt is not cooperative with HPI.  Review of Systems:  patient has a history of frequent hospitalizations for hyperglycemia and DKA.   Past Medical History:  Diagnosis Date  . Brain aneurysm   . Broken bones    clavical, ankle, arms toes wriast   . Diabetes mellitus without complication ([place])   . Dysrhythmia   . GERD (gastroesophageal reflux disease)   . Hypertension   . Substance abuse [facility])     Past Surgical History:  Procedure Laterality Date  . CORONARY/GRAFT ACUTE MI REVASCULARIZATION N/A 09/04/2019   Procedure: Coronary/Graft Acute MI Revascularization;  Surgeon: Yolonda Kida, MD;  Location: [place] [facility];  Service: Cardiovascular;  Laterality: N/A;  . ESOPHAGOGASTRODUODENOSCOPY (EGD) WITH PROPOFOL N/A 06/04/2019   Procedure: ESOPHAGOGASTRODUODENOSCOPY (EGD) WITH PROPOFOL;  Surgeon: Toledo, Benay Pike, MD;  Location: [facility];  Service: Gastroenterology;  Laterality: N/A;  . HERNIA REPAIR    . IMPLANTATION / PLACEMENT OF STRIP ELECTRODES VIA BURR HOLES SUBDURAL     anyrusum  . LEFT HEART CATH AND CORONARY ANGIOGRAPHY N/A 09/04/2019   Procedure: LEFT HEART CATH AND CORONARY ANGIOGRAPHY;  Surgeon: Yolonda Kida, MD;  Location: [place] [facility];  Service: Cardiovascular;  Laterality: N/A;  . NECK SURGERY    . TEE WITHOUT CARDIOVERSION N/A 06/03/2019   Procedure: TRANSESOPHAGEAL ECHOCARDIOGRAM (TEE);  Surgeon: Teodoro Spray, MD;  Location:  [facility] ORS;  Service: Cardiovascular;  Laterality: N/A;     reports that he has been smoking. He has never used smokeless tobacco. He reports current alcohol use of about 1.0 standard drinks of alcohol per week. He reports previous drug use. Drug: Cocaine.  No Known Allergies  Family History  Problem Relation Age of Onset  . CAD Sister   . Hypertension Sister   . Healthy Mother   . Healthy Father     Prior to Admission medications   Medication Sig Start Date End Date Taking? Authorizing Provider  aspirin EC 81 MG tablet Take 1 tablet (81 mg total) by mouth daily. 10/26/19 05/13/20  Sidney Ace, MD  blood glucose meter kit and supplies KIT Dispense based on patient and insurance preference. Use up to four times daily as directed. (FOR ICD-9 250.00, 250.01). 10/26/19   Sreenath, Sudheer B, MD  insulin glargine (LANTUS) 100 unit/mL SOPN Inject 0.18 mLs (18 Units total) into the skin 2 (two) times daily. 11/28/19   Lorella Nimrod, MD  metFORMIN (GLUCOPHAGE) 1000 MG tablet Take 1,000 mg by mouth 2 (two) times daily.    [provider]  NOVOLOG FLEXPEN 100 UNIT/ML FlexPen Inject 0-30 Units into the skin 3 (three) times daily. 09/10/19   Danford, Suann Larry, MD  pantoprazole (PROTONIX) 40 MG tablet Take 40 mg by mouth daily.    [provider]  sitaGLIPtin (JANUVIA) 50 MG tablet Take 50 mg by mouth daily.    [provider]    Physical Exam: Vitals:  12/15/19 0939 12/15/19 1100 12/15/19 1130 12/15/19 1140  BP: (!) 145/81 (!) 159/72 (!) 163/78   Pulse: 76  (!) 104   Resp: 18  (!) 24   Temp:    97.6 F (36.4 C)  TempSrc:    Axillary  SpO2: 99%  100%   Weight: 65.5 kg     Height: 6' (1.829 m)         Constitutional: NAD, calm, comfortable Vitals:   12/15/19 0939 12/15/19 1100 12/15/19 1130 12/15/19 1140  BP: (!) 145/81 (!) 159/72 (!) 163/78   Pulse: 76  (!) 104   Resp: 18  (!) 24   Temp:    97.6 F (36.4 C)  TempSrc:    Axillary  SpO2: 99%   100%   Weight: 65.5 kg     Height: 6' (1.829 m)      Eyes: PT DOES NOT OPEN EYES FOR EXAM ENMT: Mucous membranes are moist. Posterior pharynx clear of any exudate or lesions.Normal dentition.  Neck: normal, supple, no masses, no thyromegaly Respiratory: clear to auscultation bilaterally, no wheezing, no crackles. Normal respiratory effort. No accessory muscle use.  Cardiovascular: Regular rate and rhythm, no murmurs / rubs / gallops. No extremity edema. 2+ pedal pulses. No carotid bruits.  Abdomen: no tenderness, no masses palpated. No hepatosplenomegaly. Bowel sounds positive.  Musculoskeletal: no clubbing / cyanosis. No joint deformity upper and lower extremities. Good ROM, no contractures. Normal muscle tone.  Skin: no rashes, lesions, ulcers. No induration Neurologic: UNABLE TO ASSESS, he is able to sit up and moves all ext, Psychiatric: somnolent and unhappy cooperates with exam briefly Labs on Admission: I have personally reviewed following labs and imaging studies  CBC: Recent Labs  Lab 12/15/19 0945  WBC 18.6*  NEUTROABS 16.9*  HGB 15.4  HCT 50.2  MCV 102.7*  PLT 086   Basic Metabolic Panel: Recent Labs  Lab 12/15/19 0945  NA 132*  K 7.3*  CL 89*  CO2 <7*  GLUCOSE 881*  BUN 62*  CREATININE 3.19*  CALCIUM 10.2   GFR: Estimated Creatinine Clearance: 22.8 mL/min (A) (by C-G formula based on SCr of 3.19 mg/dL (H)). Liver Function Tests: Recent Labs  Lab 12/15/19 0945  AST 14*  ALT 21  ALKPHOS 125  BILITOT 2.1*  PROT 8.8*  ALBUMIN 5.2*   No results for input(s): LIPASE, AMYLASE in the last 168 hours. No results for input(s): AMMONIA in the last 168 hours. Coagulation Profile: No results for input(s): INR, PROTIME in the last 168 hours. Cardiac Enzymes: No results for input(s): CKTOTAL, CKMB, CKMBINDEX, TROPONINI in the last 168 hours. BNP (last 3 results) No results for input(s): PROBNP in the last 8760 hours. HbA1C: No results for input(s): HGBA1C in  the last 72 hours. CBG: Recent Labs  Lab 12/15/19 0942 12/15/19 1111  GLUCAP >600* >600*   Lipid Profile: No results for input(s): CHOL, HDL, LDLCALC, TRIG, CHOLHDL, LDLDIRECT in the last 72 hours. Thyroid Function Tests: No results for input(s): TSH, T4TOTAL, FREET4, T3FREE, THYROIDAB in the last 72 hours. Anemia Panel: No results for input(s): VITAMINB12, FOLATE, FERRITIN, TIBC, IRON, RETICCTPCT in the last 72 hours. Urine analysis:    Component Value Date/Time   COLORURINE YELLOW (A) 11/25/2019 2150   APPEARANCEUR HAZY (A) 11/25/2019 2150   LABSPEC 1.018 11/25/2019 2150   PHURINE 5.0 11/25/2019 2150   GLUCOSEU >=500 (A) 11/25/2019 2150   HGBUR NEGATIVE 11/25/2019 2150   BILIRUBINUR NEGATIVE 11/25/2019 2150   KETONESUR 80 (A) 11/25/2019 2150  PROTEINUR 30 (A) 11/25/2019 2150   NITRITE NEGATIVE 11/25/2019 2150   LEUKOCYTESUR NEGATIVE 11/25/2019 2150   Radiological Exams on Admission: No results found.  EKG:  Sinus tach 107 LVH, prominent p waves ,q waves in lead ii/iii.avf and v2-v6  Assessment/Plan Pt is a 58 y/o noncompliant caucasian male with pmh of dm II on insulin and h/o DKA, will admit to stepdown as vitals are stable and start insulin drip stat with DKA protocol.   Hypertension Assessment & Plan Blood pressure (!) 163/78, pulse (!) 104, temperature 97.6 F (36.4 C), temperature source Axillary, resp. rate (!) 24, height 6' (1.829 m), weight 65.5 kg, SpO2 100 %. We will start prn hydralazine. Restart home meds once pt is alert awake oriented and stable.  DKA (diabetic ketoacidoses) (HCC) Assessment & Plan Pt is a very sick man with metabolic acidosis and has been noncompliant with his regimen of diabetes . His BHA is elevated >8, anion gap 36, serum bicarb is less than 7, potassium is 7.3 Pt started on DKA protocol with serial electrolytes and anion gap monitoring. Consider psychiatry consult for assessment of capacity as pt is not taking care of himself  , And does not understand what he needs to do to take care of his illness.   AKI (acute kidney injury) (HCC) Assessment & Plan Creatinine today is 3.19 with eGFR of 20 due to dehydration from DKA and metformin.  Pt started on ivf hydration , we will avoid any nephrotoxic agents, contrast and renally Dose meds.    Hyperkalemia Assessment & Plan Pt's potassium today is 7.3, EKG shows peak T waves.  EDMD has started pt on insulin, I have give pt kayexalate and sodium bicarbonate.  Attribute to metabolic acidosis from DKA, AKI due to DKA  Dehydration and metformin. repeat bmp in 2 hours after kayexalate. We will repeat level and follow.   DVT prophylaxis: heparin Code Status: Full Family Communication: none Disposition Plan: home Consults called: None Admission status: Inpatient     V  MD [facility] if 7PM-7AM, please contact night-coverage [email or web address] Password [email or web address]  12/15/2019, 11:46 AM    

## 2019-12-15 NOTE — Assessment & Plan Note (Signed)
Blood pressure (!) 163/78, pulse (!) 104, temperature 97.6 F (36.4 C), temperature source Axillary, resp. rate (!) 24, height 6' (1.829 m), weight 65.5 kg, SpO2 100 %. We will start prn hydralazine. Restart home meds once pt is alert awake oriented and stable.

## 2019-12-15 NOTE — Assessment & Plan Note (Signed)
Creatinine today is 3.19 with eGFR of 20 due to dehydration from DKA and metformin.  Pt started on ivf hydration , we will avoid any nephrotoxic agents, contrast and renally Dose meds.

## 2019-12-15 NOTE — ED Notes (Signed)
MD Derrill Kay informed of pt's critical potassium level of 7.4 and Glucose of 881.

## 2019-12-15 NOTE — Assessment & Plan Note (Signed)
Pt's potassium today is 7.3, EKG shows peak T waves.  EDMD has started pt on insulin, I have give pt kayexalate and sodium bicarbonate.  Attribute to metabolic acidosis from DKA, AKI due to DKA  Dehydration and metformin. repeat bmp in 2 hours after kayexalate.

## 2019-12-15 NOTE — Progress Notes (Signed)
Pharmacy Antibiotic Note  Spencer [name] is a 59 y.o. male admitted on 12/15/2019 with sepsis.  Pharmacy has been consulted for Vancomycin dosing.  -DM-DKA, CXR- R infiltrate?, recent admission 1/19-1/22 DKA -also ordered CTX  Plan: Patient received Vancomycin 1500 mg IV x 1. Will continue with Vancomycin 750 mg IV q24h. Anticipating Scr to improve (when pt discharged 1/22 his Scr was 0.80)  Goal AUC 400-550. Expected AUC: 553 SCr used: 2.48 Cmin 16.5    Height: 6' (182.9 cm) Weight: 144 lb 6.4 oz (65.5 kg) IBW/kg (Calculated) : 77.6  Temp (24hrs), Avg:97.9 F (36.6 C), Min:97.6 F (36.4 C), Max:98.1 F (36.7 C)  Recent Labs  Lab 12/15/19 0945 12/15/19 1506 12/15/19 1616 12/15/19 1804  WBC 18.6*  --   --   --   CREATININE 3.43*  3.19* 3.03* 2.68* 2.48*    Estimated Creatinine Clearance: 29.3 mL/min (A) (by C-G formula based on SCr of 2.48 mg/dL (H)).    No Known Allergies  Antimicrobials this admission: Vanc 2/8 >>   CTX 2/8 >>    Dose adjustments this admission:    Microbiology results: 2/8 BCx: pending   UCx:      Sputum:    2/8 MRSA PCR: negative  Thank you for allowing pharmacy to be a part of this patient's care.  Pihu Basil A 12/15/2019 8:00 PM

## 2019-12-15 NOTE — ED Triage Notes (Addendum)
Pt comes via [facility] from home with c/o hyperglycemia. Pt has not taken insulin in several days. EMS states Fire glucometer read HI.  EMS reports VSS, 4 of zofran IM. CBG-351  Pt arrives A*OX4. Pt denies any pain. Pt states some weakness.

## 2019-12-15 NOTE — ED Provider Notes (Signed)
[facility] Emergency Department Provider Note  ____________________________________________   I have reviewed the triage vital signs and the nursing notes.   HISTORY  Chief Complaint Hyperglycemia   History limited by and level 5 caveat due to: Poor historian, not cooperative   HPI Spencer [name] is a 59 y.o. male who presents to the emergency department today because of concern for high blood sugar. The patient states that he has forgotten to take his insulin recently. It is unclear why he has forgotten to take it. Patient is fairly uncooperative with history and physical.   Records reviewed. Per medical record review patient has a history of frequent hospitalizations for hyperglycemia and DKA.   Past Medical History:  Diagnosis Date  . Brain aneurysm   . Broken bones    clavical, ankle, arms toes wriast   . Diabetes mellitus without complication ([place])   . Dysrhythmia   . GERD (gastroesophageal reflux disease)   . Hypertension   . Substance abuse [facility])     Patient Active Problem List   Diagnosis Date Noted  . Hyperglycemia   . Tobacco abuse counseling   . Atrial fibrillation with RVR ([place]) 08/07/2019  . GERD (gastroesophageal reflux disease) 08/07/2019  . Acute renal failure ([place])   . Hyperkalemia   . Sepsis ([place]) 06/09/2019  . AKI (acute kidney injury) ([place]) 02/19/2019  . Hypoglycemia 10/23/2018  . Chronic low back pain 01/16/2017  . Chronic neck pain 01/16/2017  . Diabetic neuropathy ([place]) 01/16/2017  . DM2 (diabetes mellitus, type 2) ([place]) 01/16/2017  . History of cocaine abuse ([place]) 01/16/2017  . Personal history of subdural hematoma 01/16/2017  . Closed displaced fracture of body of left calcaneus with delayed healing 12/13/2016  . Protein-calorie malnutrition, severe 11/08/2016  . DKA, type 2 ([place]) 11/06/2016  . DKA (diabetic ketoacidoses) ([place]) 04/03/2015  . Hypertension 04/03/2015  . Depression 04/03/2015  . Opiate dependence  ([place]) 04/03/2015  . Tobacco abuse 04/03/2015  . Lumbosacral neuritis 07/19/2014  . Pain of finger of right hand 07/19/2014  . Type II or unspecified type diabetes mellitus without mention of complication, not stated as uncontrolled 07/19/2014  . Knee pain 09/12/2013  . Hernia of flank 09/20/2012  . Epidermoid cyst of skin 09/20/2012  . Chronic pain of both shoulders 03/27/2012    Past Surgical History:  Procedure Laterality Date  . CORONARY/GRAFT ACUTE MI REVASCULARIZATION N/A 09/04/2019   Procedure: Coronary/Graft Acute MI Revascularization;  Surgeon: Yolonda Kida, MD;  Location: [place] CV LAB;  Service: Cardiovascular;  Laterality: N/A;  . ESOPHAGOGASTRODUODENOSCOPY (EGD) WITH PROPOFOL N/A 06/04/2019   Procedure: ESOPHAGOGASTRODUODENOSCOPY (EGD) WITH PROPOFOL;  Surgeon: Toledo, Benay Pike, MD;  Location: [facility] ENDOSCOPY;  Service: Gastroenterology;  Laterality: N/A;  . HERNIA REPAIR    . IMPLANTATION / PLACEMENT OF STRIP ELECTRODES VIA BURR HOLES SUBDURAL     anyrusum  . LEFT HEART CATH AND CORONARY ANGIOGRAPHY N/A 09/04/2019   Procedure: LEFT HEART CATH AND CORONARY ANGIOGRAPHY;  Surgeon: Yolonda Kida, MD;  Location: [place] CV LAB;  Service: Cardiovascular;  Laterality: N/A;  . NECK SURGERY    . TEE WITHOUT CARDIOVERSION N/A 06/03/2019   Procedure: TRANSESOPHAGEAL ECHOCARDIOGRAM (TEE);  Surgeon: Teodoro Spray, MD;  Location: [facility] ORS;  Service: Cardiovascular;  Laterality: N/A;    Prior to Admission medications   Medication Sig Start Date End Date Taking? Authorizing Provider  aspirin EC 81 MG tablet Take 1 tablet (81 mg total) by mouth daily. 10/26/19 05/13/20  Sidney Ace, MD  blood glucose meter kit and supplies KIT Dispense based on patient and insurance preference. Use up to four times daily as directed. (FOR ICD-9 250.00, 250.01). 10/26/19   Sreenath, Sudheer B, MD  insulin glargine (LANTUS) 100 unit/mL SOPN Inject 0.18 mLs (18 Units total) into  the skin 2 (two) times daily. 11/28/19   Lorella Nimrod, MD  metFORMIN (GLUCOPHAGE) 1000 MG tablet Take 1,000 mg by mouth 2 (two) times daily.    [provider]  NOVOLOG FLEXPEN 100 UNIT/ML FlexPen Inject 0-30 Units into the skin 3 (three) times daily. 09/10/19   Danford, Suann Larry, MD  pantoprazole (PROTONIX) 40 MG tablet Take 40 mg by mouth daily.    [provider]  sitaGLIPtin (JANUVIA) 50 MG tablet Take 50 mg by mouth daily.    [provider]    Allergies Patient has no known allergies.  Family History  Problem Relation Age of Onset  . CAD Sister   . Hypertension Sister   . Healthy Mother   . Healthy Father     Social History Social History   Tobacco Use  . Smoking status: Current Some Day Smoker  . Smokeless tobacco: Never Used  Substance Use Topics  . Alcohol use: Yes    Alcohol/week: 1.0 standard drinks    Types: 1 Cans of beer per week  . Drug use: Not Currently    Types: Cocaine    Review of Systems Patient not answering ROS questions ____________________________________________   PHYSICAL EXAM:  VITAL SIGNS: ED Triage Vitals [12/15/19 0939]  Enc Vitals Group     BP (!) 145/81     Pulse Rate 76     Resp 18     Temp      Temp src      SpO2 99 %     Weight 144 lb 6.4 oz (65.5 kg)     Height 6' (1.829 m)     Head Circumference      Peak Flow      Pain Score 0   Constitutional: Alert and oriented.  Eyes: Conjunctivae are normal.  ENT      Head: Normocephalic and atraumatic.      Nose: No congestion/rhinnorhea.      Mouth/Throat: Mucous membranes are moist.      Neck: No stridor. Hematological/Lymphatic/Immunilogical: No cervical lymphadenopathy. Cardiovascular: Tachycardic, regular rhythm.  No murmurs, rubs, or gallops.  Respiratory: Normal respiratory effort without tachypnea nor retractions. Breath sounds are clear and equal bilaterally. No wheezes/rales/rhonchi. Gastrointestinal: Soft and non tender. No rebound. No  guarding.  Genitourinary: Deferred Musculoskeletal: Normal range of motion in all extremities. No lower extremity edema. Neurologic:  Normal speech and language. No gross focal neurologic deficits are appreciated.  Skin:  Skin is warm, dry and intact. No rash noted. Psychiatric: Mood and affect are normal. Speech and behavior are normal. Patient exhibits appropriate insight and judgment.  ____________________________________________    LABS (pertinent positives/negatives)  CBC wbc 18.6, hgb 15.4, plt 372 CMP na 132, k 7.3, glu 881, cr 3.19 PH 7.04 Beta hydroxybutyric acid >8  ____________________________________________   EKG  I, Nance Pear, attending physician, personally viewed and interpreted this EKG  EKG Time: 1201 Rate: 106 Rhythm: sinus tachycardia Axis: normal Intervals: qtc 429 QRS: narrow, q waves v1, v2 ST changes: no st elevation Impression: abnormal ekg   ____________________________________________    RADIOLOGY  None  ____________________________________________   PROCEDURES  Procedures  CRITICAL CARE Performed by: Nance Pear  Total critical care time: 30 minutes  Critical care time was exclusive of separately billable procedures and treating other patients.  Critical care was necessary to treat or prevent imminent or life-threatening deterioration.  Critical care was time spent personally by me on the following activities: development of treatment plan with patient and/or surrogate as well as nursing, discussions with consultants, evaluation of patient's response to treatment, examination of patient, obtaining history from patient or surrogate, ordering and performing treatments and interventions, ordering and review of laboratory studies, ordering and review of radiographic studies, pulse oximetry and re-evaluation of patient's condition.  ____________________________________________   INITIAL IMPRESSION / ASSESSMENT AND PLAN /  ED COURSE  Pertinent labs & imaging results that were available during my care of the patient were reviewed by me and considered in my medical decision making (see chart for details).   Patient presented to the emergency department today because of concerns for elevated blood sugar.  Patient has history of frequent hospitalizations for DKA and medication noncompliance.  He states he has not been taking his insulin.  Blood work is again consistent with diabetic ketoacidosis.  Patient was started on IV insulin.  Will plan on admission.   ____________________________________________   FINAL CLINICAL IMPRESSION(S) / ED DIAGNOSES  Final diagnoses:  Diabetic ketoacidosis without coma associated with other specified diabetes mellitus ([place])     Note: This dictation was prepared with Dragon dictation. Any transcriptional errors that result from this process are unintentional     Nance Pear, MD 12/15/19 1330

## 2019-12-15 NOTE — Progress Notes (Signed)
Inpatient Diabetes Program Recommendations  AACE/ADA: New Consensus Statement on Inpatient Glycemic Control (2015)  Target Ranges:  Prepandial:   less than 140 mg/dL      Peak postprandial:   less than 180 mg/dL (1-2 hours)      Critically ill patients:  140 - 180 mg/dL   Results for Spencer [name], Spencer [name] (MRN [id number]) as of 12/15/2019 13:32  Ref. Range 12/15/2019 09:45  Sodium Latest Ref Range: 135 - 145 mmol/L 132 (L)  Potassium Latest Ref Range: 3.5 - 5.1 mmol/L 7.3 (HH)  Chloride Latest Ref Range: 98 - 111 mmol/L 89 (L)  CO2 Latest Ref Range: 22 - 32 mmol/L <7 (L)  Glucose Latest Ref Range: 70 - 99 mg/dL 976 (HH)  BUN Latest Ref Range: 6 - 20 mg/dL 62 (H)  Creatinine Latest Ref Range: 0.61 - 1.24 mg/dL 7.34 (H)  Calcium Latest Ref Range: 8.9 - 10.3 mg/dL 19.3  Anion gap Latest Ref Range: 5 - 15  NOT CALCULATED   Results for Spencer [name], Spencer [name] (MRN [id number]) as of 12/15/2019 13:32  Ref. Range 12/15/2019 09:45  Beta-Hydroxybutyric Acid Latest Ref Range: 0.05 - 0.27 mmol/L >8.00 (H)    Admit with: DKA  History: Diabetes  Home DM Meds: Lantus 18 units BID       Novolog 0-30 units TID       Metformin 1000 mg BID       Januvia 50 mg Daily  Current Orders: IV Insulin Drip     Well known to the Inpatient Diabetes Team.  Patient wasadmitted10/01/20,08/19/19,09/04/19, 09/12/19, and 10/24/19, 11/26/19.  Noted patient cocaine positivein past (07/27/19, 08/07/19, 08/19/19, 09/04/19, and 09/13/19)whichmayeffect patient's ability and willingness to manage DM.   Counseled on numerous occasions by the Diabetes Team.  Note IV Insulin drip started at 11am today.     --Will follow patient during hospitalization--  Ambrose Finland RN, MSN, CDE Diabetes Coordinator Inpatient Glycemic Control Team Team Pager: [phone] (8a-5p)

## 2019-12-15 NOTE — Assessment & Plan Note (Signed)
Pt is a very sick man with metabolic acidosis and has been noncompliant with his regimen of diabetes . His BHA is elevated >8, anion gap 36, serum bicarb is less than 7, potassium is 7.3 Pt started on DKA protocol with serial electrolytes and anion gap monitoring. Consider psychiatry consult for assessment of capacity as pt is not taking care of himself , And does not understand what he needs to do to take care of his illness.

## 2019-12-15 NOTE — Consult Note (Signed)
CRITICAL CARE PROGRESS NOTE    Name: Spencer [name] MRN: [id number] DOB: Aug 29, 1961     LOS: 0   SUBJECTIVE FINDINGS & SIGNIFICANT EVENTS   Patient description:   Spencer [name] is a 59 y.o. male with medical history significant of DM II h/o DKA,HTN, medical noncompliance comes to [place] with not taking insulin and feels bad. He was admitted to hospitalist service for DKA however was noted to have severe renal failure and was upgraded to MICU for additional evaluation and treatment   Lines / Drains: PIVx2  Cultures / Sepsis markers: Blood cultures, urine culture, respiratory culture  Antibiotics: Vancomycin and rocephin   Protocols / Consultants: PCCM, hospitalist  Tests / Events: Septic workup     PAST MEDICAL HISTORY   Past Medical History:  Diagnosis Date  . Brain aneurysm   . Broken bones    clavical, ankle, arms toes wriast   . Diabetes mellitus without complication ([place])   . Dysrhythmia   . GERD (gastroesophageal reflux disease)   . Hypertension   . Substance abuse ([place])      SURGICAL HISTORY   Past Surgical History:  Procedure Laterality Date  . CORONARY/GRAFT ACUTE MI REVASCULARIZATION N/A 09/04/2019   Procedure: Coronary/Graft Acute MI Revascularization;  Surgeon: Yolonda Kida, MD;  Location: [place] CV LAB;  Service: Cardiovascular;  Laterality: N/A;  . ESOPHAGOGASTRODUODENOSCOPY (EGD) WITH PROPOFOL N/A 06/04/2019   Procedure: ESOPHAGOGASTRODUODENOSCOPY (EGD) WITH PROPOFOL;  Surgeon: Toledo, Benay Pike, MD;  Location: [facility] ENDOSCOPY;  Service: Gastroenterology;  Laterality: N/A;  . HERNIA REPAIR    . IMPLANTATION / PLACEMENT OF STRIP ELECTRODES VIA BURR HOLES SUBDURAL     anyrusum  . LEFT HEART CATH AND CORONARY ANGIOGRAPHY N/A 09/04/2019   Procedure: LEFT HEART CATH AND  CORONARY ANGIOGRAPHY;  Surgeon: Yolonda Kida, MD;  Location: [place] CV LAB;  Service: Cardiovascular;  Laterality: N/A;  . NECK SURGERY    . TEE WITHOUT CARDIOVERSION N/A 06/03/2019   Procedure: TRANSESOPHAGEAL ECHOCARDIOGRAM (TEE);  Surgeon: Teodoro Spray, MD;  Location: [facility] ORS;  Service: Cardiovascular;  Laterality: N/A;     FAMILY HISTORY   Family History  Problem Relation Age of Onset  . CAD Sister   . Hypertension Sister   . Healthy Mother   . Healthy Father      SOCIAL HISTORY   Social History   Tobacco Use  . Smoking status: Current Some Day Smoker  . Smokeless tobacco: Never Used  Substance Use Topics  . Alcohol use: Yes    Alcohol/week: 1.0 standard drinks    Types: 1 Cans of beer per week  . Drug use: Not Currently    Types: Cocaine     MEDICATIONS   Current Medication:  Current Facility-Administered Medications:  .  aspirin EC tablet 81 mg, 81 mg, Oral, Daily, Para Skeans, MD, Stopped at 12/15/19 1607 .  dextrose 5 % in lactated ringers infusion, , Intravenous, Continuous, Lendon George, MD .  dextrose 50 % solution 0-50 mL, 0-50 mL, Intravenous, PRN, Florina Ou V, MD .  heparin injection 5,000 Units, 5,000 Units, Subcutaneous, Q8H, Para Skeans, MD, 5,000 Units at 12/15/19 1605 .  hydrALAZINE (APRESOLINE) tablet 10 mg, 10 mg, Oral, Q8H PRN, Posey Pronto, Ekta V, MD .  insulin regular, human (MYXREDLIN) 100 units/ 100 mL infusion, , Intravenous, Continuous, Florina Ou V, MD, Last Rate: 8.5 mL/hr at 12/15/19 1558, 8.5 Units/hr at 12/15/19 1558 .  lactated ringers infusion, , Intravenous, Continuous,  Vida Rigger, MD, Last Rate: 75 mL/hr at 12/15/19 1636, Rate Verify at 12/15/19 1636    ALLERGIES   Patient has no known allergies.    REVIEW OF SYSTEMS    10 point ROS unable to obtain due to confusion  PHYSICAL EXAMINATION   Vital Signs: Temp:  [97.6 F (36.4 C)-98.1 F (36.7 C)] 98.1 F (36.7 C) (02/08 1600) Pulse Rate:   [76-104] 92 (02/08 1600) Resp:  [17-24] 17 (02/08 1600) BP: (145-169)/(72-98) 158/90 (02/08 1430) SpO2:  [99 %-100 %] 99 % (02/08 1600) Weight:  [65.5 kg] 65.5 kg (02/08 0939)  GENERAL: Age-appropriate no apparent distress HEAD: Normocephalic, atraumatic.  EYES: Pupils equal, round, reactive to light.  No scleral icterus.  MOUTH: Moist mucosal membrane. NECK: Supple. No thyromegaly. No nodules. No JVD.  PULMONARY: Decreased breath sounds bilaterally CARDIOVASCULAR: S1 and S2. Regular rate and rhythm. No murmurs, rubs, or gallops.  GASTROINTESTINAL: Soft, nontender, non-distended. No masses. Positive bowel sounds. No hepatosplenomegaly.  MUSCULOSKELETAL: No swelling, clubbing, or edema.  NEUROLOGIC: Mild distress due to acute illness SKIN:intact,warm,dry   PERTINENT DATA     Infusions: . dextrose 5% lactated ringers    . insulin 8.5 Units/hr (12/15/19 1558)  . lactated ringers 75 mL/hr at 12/15/19 1636   Scheduled Medications: . aspirin EC  81 mg Oral Daily  . heparin  5,000 Units Subcutaneous Q8H   PRN Medications: dextrose, hydrALAZINE Hemodynamic parameters:   Intake/Output: No intake/output data recorded.  Ventilator  Settings:       LAB RESULTS:  Basic Metabolic Panel: Recent Labs  Lab 12/15/19 0945 12/15/19 1506  NA 137  132* 140  K 7.3*  7.3* 4.5  CL 96*  89* 106  CO2 <7*  <7* 10*  GLUCOSE 911*  881* 549*  BUN 64*  62* 61*  CREATININE 3.43*  3.19* 3.03*  CALCIUM 9.6  10.2 9.2  MG  --  2.8*  PHOS  --  3.8   Liver Function Tests: Recent Labs  Lab 12/15/19 0945  AST 14*  ALT 21  ALKPHOS 125  BILITOT 2.1*  PROT 8.8*  ALBUMIN 5.2*   No results for input(s): LIPASE, AMYLASE in the last 168 hours. No results for input(s): AMMONIA in the last 168 hours. CBC: Recent Labs  Lab 12/15/19 0945  WBC 18.6*  NEUTROABS 16.9*  HGB 15.4  HCT 50.2  MCV 102.7*  PLT 372   Cardiac Enzymes: No results for input(s): CKTOTAL, CKMB, CKMBINDEX,  TROPONINI in the last 168 hours. BNP: Invalid input(s): POCBNP CBG: Recent Labs  Lab 12/15/19 1152 12/15/19 1303 12/15/19 1337 12/15/19 1432 12/15/19 1625  GLUCAP >600* >600* >600* 539* 462*     IMAGING RESULTS:  Imaging: No results found. @PROBHOSP @   ASSESSMENT AND PLAN    -Multidisciplinary rounds held today  Severe DKA  -Follow DKA protocol phase 1 through 3 -Rest of IV fluid rehydration -Frequent electrolyte evaluation-discussed with pharmacist -Insulin drip -IV fluid resuscitation -Possibly secondary to noncompliance versus sepsis   Altered mental status with encephalopathy -Due to severe DKA and dehydration -CXR in process -Septic work-up in process   Renal Failure-most likely due volume contraction  -Continue IVF consultation -follow chem 7 -follow UO -continue Foley Catheter-assess need daily -Nephrology on case-possibly may need dialysis will discuss with nephrologist   ID -continue IV abx as prescibed -follow up cultures  GI/Nutrition GI PROPHYLAXIS as indicated DIET-->TF's as tolerated Constipation protocol as indicated  ENDO - ICU hypoglycemic\Hyperglycemia protocol -check FSBS per protocol   ELECTROLYTES -  follow labs as needed -replace as needed -pharmacy consultation   DVT/GI PRX ordered -SCDs  TRANSFUSIONS AS NEEDED MONITOR FSBS ASSESS the need for LABS as needed   Critical care provider statement:    Critical care time (minutes):  33   Critical care time was exclusive of:  Separately billable procedures and treating other patients   Critical care was necessary to treat or prevent imminent or life-threatening deterioration of the following conditions:   Severe DKA, severe AKI, multiple comorbid conditions   Critical care was time spent personally by me on the following activities:  Development of treatment plan with patient or surrogate, discussions with consultants, evaluation of patient's response to treatment,  examination of patient, obtaining history from patient or surrogate, ordering and performing treatments and interventions, ordering and review of laboratory studies and re-evaluation of patient's condition.  I assumed direction of critical care for this patient from another provider in my specialty: no    This document was prepared using Dragon voice recognition software and may include unintentional dictation errors.    Vida Rigger, M.D.  Division of Pulmonary & Critical Care Medicine  [facility]

## 2019-12-15 NOTE — Progress Notes (Signed)
Pt too drowsy to complete admission questions at this time. Responds to voice but does not maintain attention at this time. Insulin drip managed per endo tool. Bladder scan reads >600cc. Foley inserted for acute urinary retention per MD.

## 2019-12-15 NOTE — ED Notes (Signed)
Date and time results received: 12/15/19 1012 (use smartphrase ".now" to insert current time)  Test: pH Critical Value: 7.04  Name of Provider Notified: Derrill Kay  Orders Received? Or Actions Taken?: Orders Received - See Orders for details

## 2019-12-16 DIAGNOSIS — J181 Lobar pneumonia, unspecified organism: Secondary | ICD-10-CM

## 2019-12-16 DIAGNOSIS — R531 Weakness: Secondary | ICD-10-CM

## 2019-12-16 DIAGNOSIS — N179 Acute kidney failure, unspecified: Secondary | ICD-10-CM

## 2019-12-16 DIAGNOSIS — R6520 Severe sepsis without septic shock: Secondary | ICD-10-CM

## 2019-12-16 DIAGNOSIS — A419 Sepsis, unspecified organism: Principal | ICD-10-CM

## 2019-12-16 DIAGNOSIS — E1310 Other specified diabetes mellitus with ketoacidosis without coma: Secondary | ICD-10-CM

## 2019-12-16 LAB — URINE DRUG SCREEN, QUALITATIVE (ARMC ONLY)
Amphetamines, Ur Screen: POSITIVE — AB
Barbiturates, Ur Screen: NOT DETECTED
Benzodiazepine, Ur Scrn: NOT DETECTED
Cannabinoid 50 Ng, Ur ~~LOC~~: NOT DETECTED
Cocaine Metabolite,Ur ~~LOC~~: POSITIVE — AB
MDMA (Ecstasy)Ur Screen: NOT DETECTED
Methadone Scn, Ur: NOT DETECTED
Opiate, Ur Screen: NOT DETECTED
Phencyclidine (PCP) Ur S: NOT DETECTED
Tricyclic, Ur Screen: NOT DETECTED

## 2019-12-16 LAB — GLUCOSE, CAPILLARY
Glucose-Capillary: 486 mg/dL — ABNORMAL HIGH (ref 70–99)
Glucose-Capillary: 131 mg/dL — ABNORMAL HIGH (ref 70–99)
Glucose-Capillary: 136 mg/dL — ABNORMAL HIGH (ref 70–99)
Glucose-Capillary: 116 mg/dL — ABNORMAL HIGH (ref 70–99)
Glucose-Capillary: 110 mg/dL — ABNORMAL HIGH (ref 70–99)
Glucose-Capillary: 206 mg/dL — ABNORMAL HIGH (ref 70–99)
Glucose-Capillary: 373 mg/dL — ABNORMAL HIGH (ref 70–99)
Glucose-Capillary: 171 mg/dL — ABNORMAL HIGH (ref 70–99)
Glucose-Capillary: 115 mg/dL — ABNORMAL HIGH (ref 70–99)

## 2019-12-16 LAB — URINALYSIS, COMPLETE (UACMP) WITH MICROSCOPIC
Bacteria, UA: NONE SEEN
Bilirubin Urine: NEGATIVE
Glucose, UA: >=500 mg/dL — AB
Hgb urine dipstick: NEGATIVE
Ketones, ur: 20 mg/dL — AB
Nitrite: NEGATIVE
Protein, ur: 30 mg/dL — AB
Specific Gravity, Urine: 1.024 (ref 1.005–1.030)
pH: 5 (ref 5.0–8.0)

## 2019-12-16 LAB — BASIC METABOLIC PANEL
Anion gap: 24 — ABNORMAL HIGH (ref 5–15)
Anion gap: 14 (ref 5–15)
BUN: 61 mg/dL — ABNORMAL HIGH (ref 6–20)
BUN: 52 mg/dL — ABNORMAL HIGH (ref 6–20)
CO2: 10 mmol/L — ABNORMAL LOW (ref 22–32)
CO2: 19 mmol/L — ABNORMAL LOW (ref 22–32)
Calcium: 9.2 mg/dL (ref 8.9–10.3)
Calcium: 9.3 mg/dL (ref 8.9–10.3)
Chloride: 106 mmol/L (ref 98–111)
Chloride: 115 mmol/L — ABNORMAL HIGH (ref 98–111)
Creatinine, Ser: 3.03 mg/dL — ABNORMAL HIGH (ref 0.61–1.24)
Creatinine, Ser: 1.5 mg/dL — ABNORMAL HIGH (ref 0.61–1.24)
GFR calc Af Amer: 25 mL/min — ABNORMAL LOW (ref >60)
GFR calc Af Amer: 58 mL/min — ABNORMAL LOW (ref >60)
GFR calc non Af Amer: 21 mL/min — ABNORMAL LOW (ref >60)
GFR calc non Af Amer: 50 mL/min — ABNORMAL LOW (ref >60)
Glucose, Bld: 549 mg/dL (ref 70–99)
Glucose, Bld: 155 mg/dL — ABNORMAL HIGH (ref 70–99)
Potassium: 4.5 mmol/L (ref 3.5–5.1)
Potassium: 3.9 mmol/L (ref 3.5–5.1)
Sodium: 140 mmol/L (ref 135–145)
Sodium: 148 mmol/L — ABNORMAL HIGH (ref 135–145)

## 2019-12-16 LAB — PROCALCITONIN: Procalcitonin: 0.49 ng/mL

## 2019-12-16 LAB — BETA-HYDROXYBUTYRIC ACID: Beta-Hydroxybutyric Acid: 3.31 mmol/L — ABNORMAL HIGH (ref 0.05–0.27)

## 2019-12-16 MED ORDER — CHLORHEXIDINE GLUCONATE CLOTH 2 % EX PADS: 6.0000 | MEDICATED_PAD | Freq: Every day | CUTANEOUS | Status: DC

## 2019-12-16 MED ORDER — INSULIN ASPART 100 UNIT/ML ~~LOC~~ SOLN
0.0000 [IU] | Freq: Three times a day (TID) | SUBCUTANEOUS | Status: DC
  Administered 2019-12-16: 3 [IU] via SUBCUTANEOUS
  Administered 2019-12-16: 9 [IU] via SUBCUTANEOUS
  Filled 2019-12-16 (×2): qty 1

## 2019-12-16 MED ORDER — CHLORHEXIDINE GLUCONATE CLOTH 2 % EX PADS
6.0000 | MEDICATED_PAD | Freq: Every day | CUTANEOUS | Status: DC
  Administered 2019-12-16: 6 via TOPICAL

## 2019-12-16 MED ORDER — INSULIN ASPART 100 UNIT/ML ~~LOC~~ SOLN
0.0000 [IU] | Freq: Three times a day (TID) | SUBCUTANEOUS | Status: DC
  Administered 2019-12-16: 2 [IU] via SUBCUTANEOUS
  Administered 2019-12-17: 5 [IU] via SUBCUTANEOUS
  Filled 2019-12-16 (×2): qty 1

## 2019-12-16 MED ORDER — CEFDINIR 300 MG PO CAPS
300.0000 mg | ORAL_CAPSULE | Freq: Two times a day (BID) | ORAL | Status: DC
  Administered 2019-12-16 – 2019-12-17 (×3): 300 mg via ORAL
  Filled 2019-12-16 (×5): qty 1

## 2019-12-16 MED ORDER — INSULIN ASPART 100 UNIT/ML ~~LOC~~ SOLN
5.0000 [IU] | Freq: Three times a day (TID) | SUBCUTANEOUS | Status: DC
  Administered 2019-12-16 – 2019-12-17 (×3): 5 [IU] via SUBCUTANEOUS
  Filled 2019-12-16 (×4): qty 1

## 2019-12-16 MED ORDER — INSULIN GLARGINE 100 UNIT/ML ~~LOC~~ SOLN
15.0000 [IU] | SUBCUTANEOUS | Status: DC
  Administered 2019-12-16: 15 [IU] via SUBCUTANEOUS
  Filled 2019-12-16 (×2): qty 0.15

## 2019-12-16 MED ORDER — AZITHROMYCIN 250 MG PO TABS
250.0000 mg | ORAL_TABLET | Freq: Every day | ORAL | Status: DC
  Administered 2019-12-17: 250 mg via ORAL
  Filled 2019-12-16: qty 1

## 2019-12-16 MED ORDER — INSULIN ASPART 100 UNIT/ML ~~LOC~~ SOLN: 0.0000 [IU] | Freq: Every day | SUBCUTANEOUS | Status: DC

## 2019-12-16 MED ORDER — ENOXAPARIN SODIUM 40 MG/0.4ML ~~LOC~~ SOLN
40.0000 mg | SUBCUTANEOUS | Status: DC
  Administered 2019-12-16: 40 mg via SUBCUTANEOUS
  Filled 2019-12-16: qty 0.4

## 2019-12-16 MED ORDER — AZITHROMYCIN 500 MG PO TABS
500.0000 mg | ORAL_TABLET | Freq: Every day | ORAL | Status: AC
  Administered 2019-12-16: 500 mg via ORAL
  Filled 2019-12-16: qty 1

## 2019-12-16 MED ORDER — PNEUMOCOCCAL VAC POLYVALENT 25 MCG/0.5ML IJ INJ
0.5000 mL | INJECTION | INTRAMUSCULAR | Status: AC
  Administered 2019-12-17: 0.5 mL via INTRAMUSCULAR
  Filled 2019-12-16: qty 0.5

## 2019-12-16 MED ORDER — INSULIN ASPART 100 UNIT/ML ~~LOC~~ SOLN
3.0000 [IU] | Freq: Three times a day (TID) | SUBCUTANEOUS | Status: DC
  Administered 2019-12-16: 08:00:00 3 [IU] via SUBCUTANEOUS
  Filled 2019-12-16: qty 1

## 2019-12-16 MED ORDER — SODIUM CHLORIDE 0.9 % IV SOLN: INTRAVENOUS | Status: DC

## 2019-12-16 MED ORDER — INSULIN GLARGINE 100 UNIT/ML ~~LOC~~ SOLN
20.0000 [IU] | SUBCUTANEOUS | Status: DC
  Administered 2019-12-16: 20 [IU] via SUBCUTANEOUS
  Filled 2019-12-16 (×2): qty 0.2
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
## 2019-12-16 NOTE — Progress Notes (Signed)
OT Cancellation Note  Patient Details Name: Spencer [name] MRN: [id number] DOB: [birth year]   Cancelled Treatment:    Reason Eval/Treat Not Completed: Medical issues which prohibited therapy;Other (comment). Thank you for the OT consult. Order received and chart reviewed. Spoke with pt primary RN who requests hold for therapy services until pt BP more controlled. Pt BP as of most recent reading noted to be 179/98. Pt is contraindicated for OT evaluation at this time. Will follow acutely and initiate services as available and pt medically appropriate for OT evaluation.   Rockney Ghee, M.S., OTR/L Ascom: [phone] 12/16/19, 1:11 PM

## 2019-12-16 NOTE — Progress Notes (Signed)
Pt in no distress at this time. MD made aware of CBG 373, new orders received. Report called to Vernona Rieger on 2C. VSS.
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
## 2019-12-16 NOTE — Progress Notes (Signed)
Inpatient Diabetes Program Recommendations  AACE/ADA: New Consensus Statement on Inpatient Glycemic Control   Target Ranges:  Prepandial:   less than 140 mg/dL      Peak postprandial:   less than 180 mg/dL (1-2 hours)      Critically ill patients:  140 - 180 mg/dL   Results for [name], [name] (MRN [id number]) as of 12/16/2019 13:23  Ref. Range 12/15/2019 23:53 12/16/2019 01:00 12/15/2018 1:51 12/16/2019 01:56 12/16/2019 02:57 12/16/2019 03:41 12/16/2019 07:20 12/16/2019 11:28  Glucose-Capillary Latest Ref Range: 70 - 99 mg/dL 915 (H) 056 (H)   Lantus 15 units 136 (H) 116 (H) 110 (H) 206 (H)  Novolog 6 units 373 (H)  Novolog 14 units   Review of Glycemic Control  Outpatient Diabetes medications: Lantus 18 units BID, Novolog 0-30 units TID with meals, Meformin 1000 mg BID, Januvia 50 mg daily Current orders for Inpatient glycemic control: Lantus 15 units Q24H, Novolog 0-9 units TID with meals, Novolog 5 units TID with meals  Inpatient Diabetes Program Recommendations:   Insulin-Correction: Please consider adding Novolog 0-5 units QHS for bedtime correction. If glucose remains consistently greater than 180 mg/dl despite increase in meal coverage please consider increasing Novolog correction to 0-15 units TID with meals.  Insulin-Basal: Please consider increasing Lantus to 20 units Q24H.  Insulin-Meal Coverage: Noted meal coverage increased from 3 to 5 units TID today.  Thanks, Orlando Penner, RN, MSN, CDE Diabetes Coordinator Inpatient Diabetes Program [phone] (Team Pager from 8am to 5pm)

## 2019-12-16 NOTE — Evaluation (Signed)
Physical Therapy Evaluation Patient Details Name: Spencer [name] MRN: [id number] DOB: 1961-03-11 Today's Date: 12/16/2019   History of Present Illness  Per MD notes: Pt is a 59 y.o. male with medical history significant of DM II h/o DKA,HTN, medical noncompliance comes to ED after not taking insulin.  MD assessment includes: Diabetic ketoacidosis with type 2 diabetes mellitus, Clinical sepsis, acute kidney injury, right middle lobe pneumonia, Cocaine abuse, weakness, and hyperkalemia.    Clinical Impression  Pt lethargic and somewhat agitated initially upon entering room but with gentle encouragement agreed to PT evaluation.  Pt required extra time and effort with bed mobility and showed min instability and poor activity tolerance with ambulation but declined use of AD.  Pt session limited by pt fatigue with SpO2 and HR WNL during the session.  Pt will benefit from HHPT services upon discharge to safely address deficits listed in patient problem list for decreased risk of further functional decline and eventual return to PLOF.     Follow Up Recommendations Home health PT    Equipment Recommendations  None recommended by PT    Recommendations for Other Services       Precautions / Restrictions Precautions Precautions: Fall Restrictions Weight Bearing Restrictions: No Other Position/Activity Restrictions: Watch BP      Mobility  Bed Mobility Overal bed mobility: Modified Independent             General bed mobility comments: Extra time and effort required  Transfers Overall transfer level: Needs assistance Equipment used: None Transfers: Sit to/from Stand Sit to Stand: Supervision         General transfer comment: Good eccentric and concentric control with good stability upon standing  Ambulation/Gait Ambulation/Gait assistance: Supervision Gait Distance (Feet): 60 Feet Assistive device: None Gait Pattern/deviations: Step-through pattern;Decreased step length -  right;Decreased step length - left;Drifts right/left Gait velocity: decreased   General Gait Details: Slow cadence and short B step length during amb with min drifting left/right but no assist required to prevent LOB; pt declined use of AD to assist with stabiltiy  Stairs            Wheelchair Mobility    Modified Rankin (Stroke Patients Only)       Balance Overall balance assessment: Needs assistance   Sitting balance-Leahy Scale: Normal     Standing balance support: No upper extremity supported Standing balance-Leahy Scale: Fair                               Pertinent Vitals/Pain Pain Assessment: No/denies pain    Home Living Family/patient expects to be discharged to:: Private residence Living Arrangements: Other relatives;Parent Available Help at Discharge: Family;Available 24 hours/day Type of Home: House Home Access: Stairs to enter Entrance Stairs-Rails: Right;Left;Can reach both Entrance Stairs-Number of Steps: 6 Home Layout: One level Home Equipment: Walker - 2 wheels;Cane - single point      Prior Function Level of Independence: Independent         Comments: Ind amb community distances without an AD, Ind with ADLs, no fall Hx     Hand Dominance        Extremity/Trunk Assessment   Upper Extremity Assessment Upper Extremity Assessment: Overall WFL for tasks assessed    Lower Extremity Assessment Lower Extremity Assessment: Generalized weakness       Communication   Communication: No difficulties  Cognition Arousal/Alertness: Lethargic Behavior During Therapy: Flat affect Overall Cognitive Status: Within  Functional Limits for tasks assessed                                        General Comments      Exercises Other Exercises Other Exercises: HEP education for BLE APs, QS, GS, and LAQs x 10 each every 1-2 hours daily   Assessment/Plan    PT Assessment Patient needs continued PT services  PT  Problem List Decreased strength;Decreased activity tolerance;Decreased balance;Decreased mobility       PT Treatment Interventions DME instruction;Gait training;Stair training;Functional mobility training;Therapeutic activities;Therapeutic exercise;Balance training;Patient/family education    PT Goals (Current goals can be found in the Care Plan section)  Acute Rehab PT Goals Patient Stated Goal: To get stronger and return home PT Goal Formulation: With patient Time For Goal Achievement: 12/29/19 Potential to Achieve Goals: Good    Frequency Min 2X/week   Barriers to discharge        Co-evaluation               AM-PAC PT "6 Clicks" Mobility  Outcome Measure Help needed turning from your back to your side while in a flat bed without using bedrails?: None Help needed moving from lying on your back to sitting on the side of a flat bed without using bedrails?: None Help needed moving to and from a bed to a chair (including a wheelchair)?: A Little Help needed standing up from a chair using your arms (e.g., wheelchair or bedside chair)?: A Little Help needed to walk in hospital room?: A Little Help needed climbing 3-5 steps with a railing? : A Little 6 Click Score: 20    End of Session Equipment Utilized During Treatment: Gait belt Activity Tolerance: Patient tolerated treatment well Patient left: in chair;with call bell/phone within reach;with chair alarm set Nurse Communication: Mobility status PT Visit Diagnosis: Unsteadiness on feet (R26.81);Muscle weakness (generalized) (M62.81);Difficulty in walking, not elsewhere classified (R26.2)    Time: 4132-4401 PT Time Calculation (min) (ACUTE ONLY): 20 min   Charges:   PT Evaluation $PT Eval Moderate Complexity: 1 Mod          D. Scott Geneive Sandstrom PT, DPT 12/16/19, 3:41 PM

## 2019-12-16 NOTE — Consult Note (Signed)
  Patient seen briefly for capacity assessment.  Per chart review patient has had numerous admissions for DKA, hypoglycemia.  Psychiatry was consulted for purposes of determining capacity.   Patient was laying in reclining chair, very somnolent reported that he had not had sleep for several days.  Briefly we spoke and the patient denied any depression, suicidal ideation, or history of mental health issues.  He also denied any drug use.  When questioned about his failure to adhere to his regimen, patient states that his diabetes is very complicated and it remains difficult for him to maintain blood sugars.  Patient is alert and oriented at this time   Psychiatry will speak to patient further although at this time it seems that patient is not cognitively impaired and is able to make his own decisions.  Interview halted on request of the patient who preferred to get some rest.
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
## 2019-12-17 LAB — BASIC METABOLIC PANEL
Anion gap: 7 (ref 5–15)
BUN: 36 mg/dL — ABNORMAL HIGH (ref 6–20)
CO2: 22 mmol/L (ref 22–32)
Calcium: 8.8 mg/dL — ABNORMAL LOW (ref 8.9–10.3)
Chloride: 106 mmol/L (ref 98–111)
Creatinine, Ser: 1.1 mg/dL (ref 0.61–1.24)
GFR calc Af Amer: >60 mL/min (ref >60)
GFR calc non Af Amer: >60 mL/min (ref >60)
Glucose, Bld: 346 mg/dL — ABNORMAL HIGH (ref 70–99)
Potassium: 4 mmol/L (ref 3.5–5.1)
Sodium: 135 mmol/L (ref 135–145)

## 2019-12-17 LAB — GLUCOSE, CAPILLARY
Glucose-Capillary: 247 mg/dL — ABNORMAL HIGH (ref 70–99)
Glucose-Capillary: 264 mg/dL — ABNORMAL HIGH (ref 70–99)

## 2019-12-17 MED ORDER — PANTOPRAZOLE SODIUM 40 MG PO TBEC
40.0000 mg | DELAYED_RELEASE_TABLET | Freq: Every day | ORAL | Status: DC
  Administered 2019-12-17: 40 mg via ORAL
  Filled 2019-12-17: qty 1

## 2019-12-17 MED ORDER — AZITHROMYCIN 250 MG PO TABS: 250.0000 mg | ORAL_TABLET | Freq: Every day | ORAL | 0 refills | Status: AC

## 2019-12-17 MED ORDER — LINAGLIPTIN 5 MG PO TABS
5.0000 mg | ORAL_TABLET | Freq: Every day | ORAL | Status: DC
  Administered 2019-12-17: 5 mg via ORAL
  Filled 2019-12-17: qty 1

## 2019-12-17 MED ORDER — CEFDINIR 300 MG PO CAPS: 300.0000 mg | ORAL_CAPSULE | Freq: Two times a day (BID) | ORAL | 0 refills | Status: AC

## 2019-12-17 NOTE — Progress Notes (Signed)
Inpatient Diabetes Program Recommendations  AACE/ADA: New Consensus Statement on Inpatient Glycemic Control   Target Ranges:  Prepandial:   less than 140 mg/dL      Peak postprandial:   less than 180 mg/dL (1-2 hours)      Critically ill patients:  140 - 180 mg/dL   Results for Spencer [name], Spencer [name] (MRN [id number]) as of 12/17/2019 07:46  Ref. Range 12/16/2019 07:20 12/16/2019 11:28 12/16/2019 16:32 12/16/2019 22:00 12/17/2019 07:43  Glucose-Capillary Latest Ref Range: 70 - 99 mg/dL 438 (H) 381 (H) 840 (H) 115 (H) 247 (H)   Review of Glycemic Control  Outpatient Diabetes medications: Lantus 18 units BID, Novolog 0-30 units TID with meals, Meformin 1000 mg BID, Januvia 50 mg daily Current orders for Inpatient glycemic control: Lantus 20 units Q24H, Novolog 0-9 units TID with meals, Novolog 0-5 units QHS, Novolog 5 units TID with meals  Inpatient Diabetes Program Recommendations:   Insulin-Basal: Please consider increasing Lantus to 22 units Q24H.  Thanks, Orlando Penner, RN, MSN, CDE Diabetes Coordinator Inpatient Diabetes Program [phone] (Team Pager from 8am to 5pm)

## 2019-12-17 NOTE — Evaluation (Signed)
Occupational Therapy Evaluation Patient Details Name: Spencer [name] MRN: [id number] DOB: 19-Oct-1961 Today's Date: 12/17/2019    History of Present Illness Per MD notes: Pt is a 59 y.o. male with medical history significant of DM II h/o DKA,HTN, medical noncompliance comes to ED after not taking insulin.  MD assessment includes: Diabetic ketoacidosis with type 2 diabetes mellitus, Clinical sepsis, acute kidney injury, right middle lobe pneumonia, Cocaine abuse, weakness, and hyperkalemia.   Clinical Impression   Mr Spencer [name] was seen for OT evaluation this date. Prior to hospital admission, pt was Independent with ADLs, iADLs, and community mobility - including driving. Pt lives with family and reports having had multiple hospitalizations in the past 2 months, but maintains that he does not need assistance managing his medications. Pt states that he will not benefit from OT services and declines ADL activities this date. Currently pt appears to be near his baseline as described below and is able to perform ADLs and functional mobility c Independence - Supervision only. Pt was educated and verbalized understanding of falls prevention and importance of safety in home environment. No further skilled OT needs identified, will sign off at this time. Upon hospital discharge, recommend no follow up OT services at this date. Please re-consult if additional OT needs arise during this admission.     Follow Up Recommendations  No OT follow up    Equipment Recommendations       Recommendations for Other Services       Precautions / Restrictions Precautions Precautions: Fall Restrictions Weight Bearing Restrictions: No Other Position/Activity Restrictions: Watch BP      Mobility Bed Mobility Overal bed mobility: Modified Independent             General bed mobility comments: L bed rail for sup<>sit  Transfers Overall transfer level: Needs assistance Equipment used: None              General transfer comment: Pt declined OOB activity this date. Pt performed brief squat c Supervision while sitting at EOB to prevent sitting / pulling on gown while seated.     Balance Overall balance assessment: Needs assistance Sitting-balance support: Single extremity supported;Feet supported Sitting balance-Leahy Scale: Normal Sitting balance - Comments: Pt required single UE support during UE MMT                                   ADL either performed or assessed with clinical judgement   ADL Overall ADL's : At baseline                                       General ADL Comments: Pt declines to participate in functional ADL task this assessment. Independent c bed mobility and functional reach and BUE AROM - anticipate at baseline. Independent adjusted B socks seated EOB.      Vision         Perception     Praxis      Pertinent Vitals/Pain Pain Assessment: No/denies pain     Hand Dominance Right   Extremity/Trunk Assessment Upper Extremity Assessment Upper Extremity Assessment: Overall WFL for tasks assessed(BUE grossly 4/5 )   Lower Extremity Assessment Lower Extremity Assessment: Defer to PT evaluation       Communication Communication Communication: No difficulties   Cognition Arousal/Alertness: Awake/alert Behavior During Therapy: Agitated  General Comments: Pt expresses frustration over staff in his room, agreeable to OT eval but states he does not think OT will benefit him.   General Comments       Exercises Exercises: Other exercises Other Exercises Other Exercises:  Other Exercises: Educated re: falls prevention, self care, OT role in acute setting   Shoulder Instructions      Home Living Family/patient expects to be discharged to:: Private residence Living Arrangements: Other relatives;Parent Available Help at Discharge: Family;Available 24 hours/day Type of  Home: House Home Access: Stairs to enter [employer] of Steps: 6 Entrance Stairs-Rails: Right;Left;Can reach both Home Layout: One level               Home Equipment: Walker - 2 wheels;Cane - single point          Prior Functioning/Environment Level of Independence: Independent        Comments: Ind with ADLs, no fall Hx        OT Problem List: Decreased activity tolerance;Decreased safety awareness      OT Treatment/Interventions:      OT Goals(Current goals can be found in the care plan section) Acute Rehab OT Goals Patient Stated Goal: To get stronger and return home OT Goal Formulation: All assessment and education complete, DC therapy Time For Goal Achievement: 12/17/19 Potential to Achieve Goals: Good  OT Frequency:     Barriers to D/C:            Co-evaluation              AM-PAC OT "6 Clicks" Daily Activity     Outcome Measure Help from another person eating meals?: None Help from another person taking care of personal grooming?: None Help from another person toileting, which includes using toliet, bedpan, or urinal?: None Help from another person bathing (including washing, rinsing, drying)?: A Little Help from another person to put on and taking off regular upper body clothing?: None Help from another person to put on and taking off regular lower body clothing?: A Little 6 Click Score: 22   End of Session    Activity Tolerance: Patient tolerated treatment well Patient left: in bed;with call bell/phone within reach;with bed alarm set  OT Visit Diagnosis: Other abnormalities of gait and mobility (R26.89)                Time: 1007-1016 OT Time Calculation (min): 9 min Charges:  OT General Charges $OT Visit: 1 Visit OT Evaluation $OT Eval Low Complexity: 1 Low  Dessie Coma, M.S. OTR/L  12/17/19, 10:41 AM

## 2019-12-17 NOTE — TOC Initial Note (Addendum)
Transition of Care [facility]) - Initial/Assessment Note    Patient Details  Name: Spencer [name] MRN: [id number] Date of Birth: 1961/06/02  [facility] CM/SW Contact:    Beverly Sessions, RN Phone Number: 12/17/2019, 11:18 AM  Clinical Narrative:                 Patient to discharge home today Patient admitted with hyperkalemia Met with patient at bedside  Patients states that he lives at home with his father Family provides transportation Patient states that he has not medications needs.  States that he is able to afford all of them, recently refilled all of them, and has them at home  Patient cocaine positive.  In the past patient has acknowledged use and accepted resources.  This admission pain denies use and states "yall must be trippin"  PT has assessed patient and recommends home health services.  Patient adamantly declines all services.  States "I just want to be left alone"  Patient states he has a RW and cane in the home  Psych has seen patient and states "not cognitively impaired and is able to make his own decisions"  MD updated    Expected Discharge Plan: Home/Self Care Barriers to Discharge: No Barriers Identified   Patient Goals and CMS Choice        Expected Discharge Plan and Services Expected Discharge Plan: Home/Self Care   Discharge Planning Services: CM Consult   Living arrangements for the past 2 months: Single Family Home Expected Discharge Date: 12/17/19                         HH Arranged: Refused HH          Prior Living Arrangements/Services Living arrangements for the past 2 months: Single Family Home Lives with:: Parents Patient language and need for interpreter reviewed:: Yes Do you feel safe going back to the place where you live?: Yes        Care giver support system in place?: Yes (comment) Current home services: DME Criminal Activity/Legal Involvement Pertinent to Current Situation/Hospitalization: No - Comment as  needed  Activities of Daily Living Home Assistive Devices/Equipment: None ADL Screening (condition at time of admission) Patient's cognitive ability adequate to safely complete daily activities?: Yes Is the patient deaf or have difficulty hearing?: No Does the patient have difficulty seeing, even when wearing glasses/contacts?: No Does the patient have difficulty concentrating, remembering, or making decisions?: No Patient able to express need for assistance with ADLs?: Yes Does the patient have difficulty dressing or bathing?: Yes Independently performs ADLs?: No Does the patient have difficulty walking or climbing stairs?: No Weakness of Legs: None Weakness of Arms/Hands: None  Permission Sought/Granted                  Emotional Assessment Appearance:: Appears stated age     Orientation: : Oriented to Self, Oriented to Place, Oriented to  Time, Oriented to Situation Alcohol / Substance Use: Illicit Drugs Psych Involvement: Yes (comment)  Admission diagnosis:  DKA (diabetic ketoacidoses) ([place]) [E11.10] Diabetic ketoacidosis without coma associated with other specified diabetes mellitus ([place]) [E13.10] Patient Active Problem List   Diagnosis Date Noted  . Weakness   . Lobar pneumonia ([place])   . Hyperglycemia   . Tobacco abuse counseling   . Atrial fibrillation with RVR ([place]) 08/07/2019  . GERD (gastroesophageal reflux disease) 08/07/2019  . Acute renal failure ([place])   . Hyperkalemia   .  Sepsis ([place]) 06/09/2019  . AKI (acute kidney injury) ([place]) 02/19/2019  . Hypoglycemia 10/23/2018  . Chronic low back pain 01/16/2017  . Chronic neck pain 01/16/2017  . Diabetic neuropathy ([place]) 01/16/2017  . DM2 (diabetes mellitus, type 2) ([place]) 01/16/2017  . History of cocaine abuse ([place]) 01/16/2017  . Personal history of subdural hematoma 01/16/2017  . Closed displaced fracture of body of left calcaneus with delayed healing 12/13/2016  . Protein-calorie malnutrition, severe  11/08/2016  . DKA, type 2 ([place]) 11/06/2016  . DKA (diabetic ketoacidoses) ([place]) 04/03/2015  . Hypertension 04/03/2015  . Depression 04/03/2015  . Opiate dependence ([place]) 04/03/2015  . Tobacco abuse 04/03/2015  . Lumbosacral neuritis 07/19/2014  . Pain of finger of right hand 07/19/2014  . Type II or unspecified type diabetes mellitus without mention of complication, not stated as uncontrolled 07/19/2014  . Knee pain 09/12/2013  . Hernia of flank 09/20/2012  . Epidermoid cyst of skin 09/20/2012  . Chronic pain of both shoulders 03/27/2012   PCP:  Jodi Marble, MD Pharmacy:   [facility] [place], [place] - [address] [address] [facility] [place] [address] Phone: [phone] Fax: [phone]     Social Determinants of Health (SDOH) Interventions    Readmission Risk Interventions Readmission Risk Prevention Plan 12/17/2019 11/27/2019 09/15/2019  Transportation Screening Complete Complete Complete  PCP or Specialist Appt within 3-5 Days - - -  [place] or [place] or Home Care Consult comments - - -  Palliative Care Screening - - -  Medication Review (RN Care Manager) Complete Complete Complete  PCP or Specialist appointment within 3-5 days of discharge Patient refused Patient refused (No Data)  [place] or Home Care Consult Patient refused - Patient refused  SW Recovery Care/Counseling Consult - - -  SW Consult Not Complete Comments - - -  Palliative Care Screening Not Applicable Not Applicable Not [place] Not Applicable Not Applicable Not Applicable  Some recent data might be hidden

## 2019-12-17 NOTE — Progress Notes (Signed)
Spencer [name] to be D/C'd home per MD order.  Discussed prescriptions and follow up appointments with the patient. Prescriptions given to patient, medication list explained in detail. Pt verbalized understanding.  Allergies as of 12/17/2019   No Known Allergies      Medication List     TAKE these medications    aspirin EC 81 MG tablet Take 1 tablet (81 mg total) by mouth daily.   azithromycin 250 MG tablet Commonly known as: Zithromax Take 1 tablet (250 mg total) by mouth daily for 3 days. Take 1 tablet daily for 3 days.   blood glucose meter kit and supplies Kit Dispense based on patient and insurance preference. Use up to four times daily as directed. (FOR ICD-9 250.00, 250.01).   cefdinir 300 MG capsule Commonly known as: OMNICEF Take 1 capsule (300 mg total) by mouth 2 (two) times daily for 3 days.   insulin glargine 100 unit/mL Sopn Commonly known as: LANTUS Inject 0.18 mLs (18 Units total) into the skin 2 (two) times daily.   metFORMIN 1000 MG tablet Commonly known as: GLUCOPHAGE Take 1,000 mg by mouth 2 (two) times daily.   NovoLOG FlexPen 100 UNIT/ML FlexPen Generic drug: insulin aspart Inject 0-30 Units into the skin 3 (three) times daily.   pantoprazole 40 MG tablet Commonly known as: PROTONIX Take 40 mg by mouth daily.   sitaGLIPtin 50 MG tablet Commonly known as: JANUVIA Take 50 mg by mouth daily.        Vitals:   12/16/19 2042 12/17/19 0557  BP: 128/77 (!) 161/95  Pulse: 77 81  Resp: 20 20  Temp: 98.9 F (37.2 C) 98.1 F (36.7 C)  SpO2: 100% 99%    Skin clean, dry and intact without evidence of skin break down, no evidence of skin tears noted. IV catheter discontinued intact. Site without signs and symptoms of complications. Dressing and pressure applied. Pt denies pain at this time. No complaints noted.  An After Visit Summary was printed and given to the patient. Patient escorted via [place], and D/C home via private auto.  [name] Spencer [name]

## 2019-12-20 LAB — CULTURE, BLOOD (ROUTINE X 2)
Culture: NO GROWTH
Culture: NO GROWTH
Special Requests: ADEQUATE
Special Requests: ADEQUATE
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
## 2020-01-05 LAB — BLOOD GAS, VENOUS
Acid-base deficit: 24.3 mmol/L — ABNORMAL HIGH (ref 0.0–2.0)
Bicarbonate: 4.6 mmol/L — ABNORMAL LOW (ref 20.0–28.0)
O2 Saturation: 53 %
Patient temperature: 37
pH, Ven: 7.04 — CL (ref 7.250–7.430)
pO2, Ven: 43 mmHg (ref 32.0–45.0)

## 2020-02-13 ENCOUNTER — Other Ambulatory Visit: Payer: Self-pay

## 2020-02-13 ENCOUNTER — Emergency Department: Payer: Medicaid Other

## 2020-02-13 ENCOUNTER — Inpatient Hospital Stay
Admission: EM | Admit: 2020-02-13 | Discharge: 2020-02-17 | DRG: 917 | Disposition: A | Discharge status: Home / Self Care | Payer: Medicaid Other | Attending: Internal Medicine | Admitting: Internal Medicine

## 2020-02-13 ENCOUNTER — Encounter: Payer: Self-pay | Admitting: Emergency Medicine

## 2020-02-13 DIAGNOSIS — J9602 Acute respiratory failure with hypercapnia: Secondary | ICD-10-CM | POA: Diagnosis present

## 2020-02-13 DIAGNOSIS — E1165 Type 2 diabetes mellitus with hyperglycemia: Secondary | ICD-10-CM | POA: Diagnosis not present

## 2020-02-13 DIAGNOSIS — Z9119 Patient's noncompliance with other medical treatment and regimen: Secondary | ICD-10-CM | POA: Diagnosis not present

## 2020-02-13 DIAGNOSIS — F17200 Nicotine dependence, unspecified, uncomplicated: Secondary | ICD-10-CM | POA: Diagnosis present

## 2020-02-13 DIAGNOSIS — J9601 Acute respiratory failure with hypoxia: Secondary | ICD-10-CM | POA: Diagnosis present

## 2020-02-13 DIAGNOSIS — T401X1A Poisoning by heroin, accidental (unintentional), initial encounter: Secondary | ICD-10-CM | POA: Diagnosis present

## 2020-02-13 DIAGNOSIS — E875 Hyperkalemia: Secondary | ICD-10-CM | POA: Diagnosis present

## 2020-02-13 DIAGNOSIS — E1111 Type 2 diabetes mellitus with ketoacidosis with coma: Secondary | ICD-10-CM | POA: Diagnosis present

## 2020-02-13 DIAGNOSIS — Z8249 Family history of ischemic heart disease and other diseases of the circulatory system: Secondary | ICD-10-CM

## 2020-02-13 DIAGNOSIS — Z20822 Contact with and (suspected) exposure to covid-19: Secondary | ICD-10-CM | POA: Diagnosis present

## 2020-02-13 DIAGNOSIS — L89326 Pressure-induced deep tissue damage of left buttock: Secondary | ICD-10-CM | POA: Diagnosis present

## 2020-02-13 DIAGNOSIS — N179 Acute kidney failure, unspecified: Secondary | ICD-10-CM

## 2020-02-13 DIAGNOSIS — E1142 Type 2 diabetes mellitus with diabetic polyneuropathy: Secondary | ICD-10-CM | POA: Diagnosis present

## 2020-02-13 DIAGNOSIS — F14129 Cocaine abuse with intoxication, unspecified: Secondary | ICD-10-CM | POA: Diagnosis present

## 2020-02-13 DIAGNOSIS — J690 Pneumonitis due to inhalation of food and vomit: Secondary | ICD-10-CM | POA: Diagnosis present

## 2020-02-13 DIAGNOSIS — F1410 Cocaine abuse, uncomplicated: Secondary | ICD-10-CM | POA: Diagnosis not present

## 2020-02-13 DIAGNOSIS — J9690 Respiratory failure, unspecified, unspecified whether with hypoxia or hypercapnia: Secondary | ICD-10-CM | POA: Diagnosis present

## 2020-02-13 DIAGNOSIS — K219 Gastro-esophageal reflux disease without esophagitis: Secondary | ICD-10-CM | POA: Diagnosis present

## 2020-02-13 DIAGNOSIS — L89316 Pressure-induced deep tissue damage of right buttock: Secondary | ICD-10-CM | POA: Diagnosis present

## 2020-02-13 DIAGNOSIS — Z66 Do not resuscitate: Secondary | ICD-10-CM | POA: Diagnosis present

## 2020-02-13 DIAGNOSIS — N170 Acute kidney failure with tubular necrosis: Secondary | ICD-10-CM | POA: Diagnosis present

## 2020-02-13 DIAGNOSIS — I110 Hypertensive heart disease with heart failure: Secondary | ICD-10-CM | POA: Diagnosis present

## 2020-02-13 DIAGNOSIS — Y92009 Unspecified place in unspecified non-institutional (private) residence as the place of occurrence of the external cause: Secondary | ICD-10-CM | POA: Diagnosis not present

## 2020-02-13 DIAGNOSIS — Z9111 Patient's noncompliance with dietary regimen: Secondary | ICD-10-CM

## 2020-02-13 DIAGNOSIS — Z7982 Long term (current) use of aspirin: Secondary | ICD-10-CM | POA: Diagnosis not present

## 2020-02-13 DIAGNOSIS — E870 Hyperosmolality and hypernatremia: Secondary | ICD-10-CM | POA: Diagnosis present

## 2020-02-13 DIAGNOSIS — Z794 Long term (current) use of insulin: Secondary | ICD-10-CM | POA: Diagnosis not present

## 2020-02-13 DIAGNOSIS — I5032 Chronic diastolic (congestive) heart failure: Secondary | ICD-10-CM | POA: Diagnosis present

## 2020-02-13 DIAGNOSIS — E1011 Type 1 diabetes mellitus with ketoacidosis with coma: Secondary | ICD-10-CM

## 2020-02-13 DIAGNOSIS — R9431 Abnormal electrocardiogram [ECG] [EKG]: Secondary | ICD-10-CM

## 2020-02-13 DIAGNOSIS — E872 Acidosis: Secondary | ICD-10-CM | POA: Diagnosis present

## 2020-02-13 DIAGNOSIS — G9341 Metabolic encephalopathy: Secondary | ICD-10-CM | POA: Diagnosis present

## 2020-02-13 DIAGNOSIS — I2510 Atherosclerotic heart disease of native coronary artery without angina pectoris: Secondary | ICD-10-CM | POA: Diagnosis present

## 2020-02-13 LAB — URINE DRUG SCREEN, QUALITATIVE (ARMC ONLY)
Amphetamines, Ur Screen: NOT DETECTED
Barbiturates, Ur Screen: NOT DETECTED
Benzodiazepine, Ur Scrn: NOT DETECTED
Cannabinoid 50 Ng, Ur ~~LOC~~: NOT DETECTED
Cocaine Metabolite,Ur ~~LOC~~: POSITIVE — AB
MDMA (Ecstasy)Ur Screen: NOT DETECTED
Methadone Scn, Ur: NOT DETECTED
Opiate, Ur Screen: NOT DETECTED
Phencyclidine (PCP) Ur S: NOT DETECTED
Tricyclic, Ur Screen: NOT DETECTED

## 2020-02-13 LAB — PHOSPHORUS: Phosphorus: 13.3 mg/dL — ABNORMAL HIGH (ref 2.5–4.6)

## 2020-02-13 LAB — TROPONIN I (HIGH SENSITIVITY)
Troponin I (High Sensitivity): 138 ng/L (ref <18)
Troponin I (High Sensitivity): 115 ng/L (ref <18)

## 2020-02-13 LAB — PROCALCITONIN: Procalcitonin: 1.56 ng/mL

## 2020-02-13 LAB — RESPIRATORY PANEL BY RT PCR (FLU A&B, COVID)
Influenza A by PCR: NEGATIVE
Influenza B by PCR: NEGATIVE
SARS Coronavirus 2 by RT PCR: NEGATIVE

## 2020-02-13 LAB — BASIC METABOLIC PANEL
Anion gap: UNDETERMINED (ref 5–15)
BUN: 92 mg/dL — ABNORMAL HIGH (ref 8–23)
CO2: <7 mmol/L — ABNORMAL LOW (ref 22–32)
Calcium: 9.1 mg/dL (ref 8.9–10.3)
Chloride: 95 mmol/L — ABNORMAL LOW (ref 98–111)
Creatinine, Ser: 4.61 mg/dL — ABNORMAL HIGH (ref 0.61–1.24)
GFR calc Af Amer: 15 mL/min — ABNORMAL LOW (ref >60)
GFR calc non Af Amer: 13 mL/min — ABNORMAL LOW (ref >60)
Glucose, Bld: 1291 mg/dL (ref 70–99)
Potassium: 7.4 mmol/L (ref 3.5–5.1)
Sodium: 132 mmol/L — ABNORMAL LOW (ref 135–145)

## 2020-02-13 LAB — PROTIME-INR
INR: 0.9 (ref 0.8–1.2)
Prothrombin Time: 12.2 seconds (ref 11.4–15.2)

## 2020-02-13 LAB — CBC WITH DIFFERENTIAL/PLATELET
Abs Immature Granulocytes: 0.41 10*3/uL — ABNORMAL HIGH (ref 0.00–0.07)
Basophils Absolute: 0.1 10*3/uL (ref 0.0–0.1)
Basophils Relative: 0 %
Eosinophils Absolute: 0 10*3/uL (ref 0.0–0.5)
Eosinophils Relative: 0 %
HCT: 52.8 % — ABNORMAL HIGH (ref 39.0–52.0)
Hemoglobin: 14.1 g/dL (ref 13.0–17.0)
Immature Granulocytes: 2 %
Lymphocytes Relative: 6 %
Lymphs Abs: 1.3 10*3/uL (ref 0.7–4.0)
MCH: 31.6 pg (ref 26.0–34.0)
MCHC: 26.7 g/dL — ABNORMAL LOW (ref 30.0–36.0)
MCV: 118.4 fL — ABNORMAL HIGH (ref 80.0–100.0)
Monocytes Absolute: 1.5 10*3/uL — ABNORMAL HIGH (ref 0.1–1.0)
Monocytes Relative: 7 %
Neutro Abs: 17.9 10*3/uL — ABNORMAL HIGH (ref 1.7–7.7)
Neutrophils Relative %: 85 %
Platelets: 309 10*3/uL (ref 150–400)
RBC: 4.46 MIL/uL (ref 4.22–5.81)
RDW: 12.7 % (ref 11.5–15.5)
WBC: 21.4 10*3/uL — ABNORMAL HIGH (ref 4.0–10.5)
nRBC: 0 % (ref 0.0–0.2)

## 2020-02-13 LAB — LIPID PANEL
Cholesterol: 230 mg/dL — ABNORMAL HIGH (ref 0–200)
HDL: 29 mg/dL — ABNORMAL LOW (ref >40)
LDL Cholesterol: UNDETERMINED mg/dL (ref 0–99)
Total CHOL/HDL Ratio: 7.9 RATIO
Triglycerides: 634 mg/dL — ABNORMAL HIGH (ref <150)
VLDL: UNDETERMINED mg/dL (ref 0–40)

## 2020-02-13 LAB — BLOOD GAS, ARTERIAL
Acid-base deficit: 24 mmol/L — ABNORMAL HIGH (ref 0.0–2.0)
Bicarbonate: 6.6 mmol/L — ABNORMAL LOW (ref 20.0–28.0)
FIO2: 100
MECHVT: 550 mL
O2 Saturation: 100 %
PEEP: 5 cmH2O
Patient temperature: 35.1
RATE: 25 resp/min
pCO2 arterial: 26 mmHg — ABNORMAL LOW (ref 32.0–48.0)
pH, Arterial: 7 — CL (ref 7.350–7.450)
pO2, Arterial: 520 mmHg — ABNORMAL HIGH (ref 83.0–108.0)

## 2020-02-13 LAB — COMPREHENSIVE METABOLIC PANEL
ALT: 20 U/L (ref 0–44)
AST: 22 U/L (ref 15–41)
Albumin: 4 g/dL (ref 3.5–5.0)
Alkaline Phosphatase: 98 U/L (ref 38–126)
BUN: 90 mg/dL — ABNORMAL HIGH (ref 8–23)
CO2: <7 mmol/L — ABNORMAL LOW (ref 22–32)
Calcium: 9.4 mg/dL (ref 8.9–10.3)
Chloride: 93 mmol/L — ABNORMAL LOW (ref 98–111)
Creatinine, Ser: 4.71 mg/dL — ABNORMAL HIGH (ref 0.61–1.24)
GFR calc Af Amer: 14 mL/min — ABNORMAL LOW (ref >60)
GFR calc non Af Amer: 12 mL/min — ABNORMAL LOW (ref >60)
Glucose, Bld: 1424 mg/dL (ref 70–99)
Potassium: 7.4 mmol/L (ref 3.5–5.1)
Sodium: 132 mmol/L — ABNORMAL LOW (ref 135–145)
Total Bilirubin: 2.3 mg/dL — ABNORMAL HIGH (ref 0.3–1.2)
Total Protein: 6.4 g/dL — ABNORMAL LOW (ref 6.5–8.1)

## 2020-02-13 LAB — GLUCOSE, CAPILLARY
Glucose-Capillary: >600 mg/dL (ref 70–99)
Glucose-Capillary: >600 mg/dL (ref 70–99)
Glucose-Capillary: >600 mg/dL (ref 70–99)
Glucose-Capillary: >600 mg/dL (ref 70–99)
Glucose-Capillary: >600 mg/dL (ref 70–99)
Glucose-Capillary: >600 mg/dL (ref 70–99)
Glucose-Capillary: >600 mg/dL (ref 70–99)

## 2020-02-13 LAB — URINALYSIS, COMPLETE (UACMP) WITH MICROSCOPIC
Bilirubin Urine: NEGATIVE
Glucose, UA: >=500 mg/dL — AB
Ketones, ur: 20 mg/dL — AB
Leukocytes,Ua: NEGATIVE
Nitrite: NEGATIVE
Protein, ur: 30 mg/dL — AB
Specific Gravity, Urine: 1.02 (ref 1.005–1.030)
pH: 5 (ref 5.0–8.0)

## 2020-02-13 LAB — LACTIC ACID, PLASMA
Lactic Acid, Venous: 5.6 mmol/L (ref 0.5–1.9)
Lactic Acid, Venous: 2.7 mmol/L (ref 0.5–1.9)

## 2020-02-13 LAB — LDL CHOLESTEROL, DIRECT: Direct LDL: 74.4 mg/dL (ref 0–99)

## 2020-02-13 LAB — APTT: aPTT: 29 seconds (ref 24–36)

## 2020-02-13 LAB — MRSA PCR SCREENING: MRSA by PCR: NEGATIVE

## 2020-02-13 LAB — BETA-HYDROXYBUTYRIC ACID: Beta-Hydroxybutyric Acid: >8 mmol/L — ABNORMAL HIGH (ref 0.05–0.27)

## 2020-02-13 LAB — MAGNESIUM: Magnesium: 3.6 mg/dL — ABNORMAL HIGH (ref 1.7–2.4)

## 2020-02-13 LAB — LIPASE, BLOOD: Lipase: 47 U/L (ref 11–51)

## 2020-02-13 MED ORDER — SODIUM CHLORIDE 0.9 % IV SOLN: 250.0000 mL | INTRAVENOUS | Status: DC | PRN

## 2020-02-13 MED ORDER — FENTANYL 2500MCG IN NS 250ML (10MCG/ML) PREMIX INFUSION
0.0000 ug/h | INTRAVENOUS | Status: DC
  Administered 2020-02-13: 16:00:00 25 ug/h via INTRAVENOUS
  Administered 2020-02-14: 275 ug/h via INTRAVENOUS
  Filled 2020-02-13 (×3): qty 250

## 2020-02-13 MED ORDER — SODIUM CHLORIDE 0.9 % IV SOLN: INTRAVENOUS | Status: DC

## 2020-02-13 MED ORDER — FAMOTIDINE IN NACL 20-0.9 MG/50ML-% IV SOLN
20.0000 mg | INTRAVENOUS | Status: DC
  Administered 2020-02-13 – 2020-02-16 (×4): 20 mg via INTRAVENOUS
  Filled 2020-02-13 (×4): qty 50

## 2020-02-13 MED ORDER — DEXTROSE-NACL 5-0.45 % IV SOLN: INTRAVENOUS | Status: DC

## 2020-02-13 MED ORDER — SODIUM CHLORIDE 0.9 % IV BOLUS
1000.0000 mL | Freq: Once | INTRAVENOUS | Status: AC
  Administered 2020-02-13: 1000 mL via INTRAVENOUS

## 2020-02-13 MED ORDER — ACETAMINOPHEN 325 MG PO TABS: 650.0000 mg | ORAL_TABLET | ORAL | Status: DC | PRN

## 2020-02-13 MED ORDER — SODIUM BICARBONATE-DEXTROSE 150-5 MEQ/L-% IV SOLN
150.0000 meq | INTRAVENOUS | Status: DC
  Filled 2020-02-13 (×4): qty 1000

## 2020-02-13 MED ORDER — SODIUM CHLORIDE 0.9% FLUSH
3.0000 mL | Freq: Two times a day (BID) | INTRAVENOUS | Status: DC
  Administered 2020-02-13 – 2020-02-17 (×8): 3 mL via INTRAVENOUS

## 2020-02-13 MED ORDER — HEPARIN (PORCINE) 25000 UT/250ML-% IV SOLN
1000.0000 [IU]/h | INTRAVENOUS | Status: DC
  Administered 2020-02-13: 20:00:00 750 [IU]/h via INTRAVENOUS
  Administered 2020-02-15: 04:00:00 600 [IU]/h via INTRAVENOUS
  Administered 2020-02-16: 09:00:00 900 [IU]/h via INTRAVENOUS
  Filled 2020-02-13 (×2): qty 250

## 2020-02-13 MED ORDER — ALBUTEROL SULFATE (2.5 MG/3ML) 0.083% IN NEBU
5.0000 mg | INHALATION_SOLUTION | Freq: Once | RESPIRATORY_TRACT | Status: DC
  Filled 2020-02-13: qty 6

## 2020-02-13 MED ORDER — HEPARIN BOLUS VIA INFUSION
3900.0000 [IU] | Freq: Once | INTRAVENOUS | Status: AC
  Administered 2020-02-13: 3900 [IU] via INTRAVENOUS
  Filled 2020-02-13: qty 3900

## 2020-02-13 MED ORDER — SODIUM ZIRCONIUM CYCLOSILICATE 5 G PO PACK
10.0000 g | PACK | Freq: Two times a day (BID) | ORAL | Status: DC
  Administered 2020-02-13: 10 g via ORAL
  Filled 2020-02-13: qty 2

## 2020-02-13 MED ORDER — FAMOTIDINE IN NACL 20-0.9 MG/50ML-% IV SOLN: 20.0000 mg | Freq: Two times a day (BID) | INTRAVENOUS | Status: DC

## 2020-02-13 MED ORDER — ORAL CARE MOUTH RINSE
15.0000 mL | OROMUCOSAL | Status: DC
  Administered 2020-02-13 – 2020-02-16 (×25): 15 mL via OROMUCOSAL
  Filled 2020-02-13 (×5): qty 15

## 2020-02-13 MED ORDER — POLYETHYLENE GLYCOL 3350 17 G PO PACK: 17.0000 g | PACK | Freq: Every day | ORAL | Status: DC | PRN

## 2020-02-13 MED ORDER — DEXTROSE 50 % IV SOLN: 0.0000 mL | INTRAVENOUS | Status: DC | PRN

## 2020-02-13 MED ORDER — ASPIRIN 81 MG PO CHEW
81.0000 mg | CHEWABLE_TABLET | Freq: Every day | ORAL | Status: DC
  Administered 2020-02-14 – 2020-02-17 (×4): 81 mg
  Filled 2020-02-13 (×4): qty 1

## 2020-02-13 MED ORDER — DOCUSATE SODIUM 100 MG PO CAPS: 100.0000 mg | ORAL_CAPSULE | Freq: Two times a day (BID) | ORAL | Status: DC | PRN

## 2020-02-13 MED ORDER — ONDANSETRON HCL 4 MG/2ML IJ SOLN: 4.0000 mg | Freq: Four times a day (QID) | INTRAMUSCULAR | Status: DC | PRN

## 2020-02-13 MED ORDER — SODIUM CHLORIDE 0.9% FLUSH: 3.0000 mL | INTRAVENOUS | Status: DC | PRN

## 2020-02-13 MED ORDER — SODIUM CHLORIDE 0.9 % IV SOLN
3.0000 g | Freq: Two times a day (BID) | INTRAVENOUS | Status: DC
  Administered 2020-02-13 – 2020-02-15 (×4): 3 g via INTRAVENOUS
  Filled 2020-02-13: qty 8
  Filled 2020-02-13 (×4): qty 3

## 2020-02-13 MED ORDER — MIDAZOLAM HCL 2 MG/2ML IJ SOLN
2.0000 mg | INTRAMUSCULAR | Status: DC | PRN
  Administered 2020-02-13 (×2): 2 mg via INTRAVENOUS

## 2020-02-13 MED ORDER — VANCOMYCIN VARIABLE DOSE PER UNSTABLE RENAL FUNCTION (PHARMACIST DOSING): Status: DC

## 2020-02-13 MED ORDER — CHLORHEXIDINE GLUCONATE 0.12% ORAL RINSE (MEDLINE KIT)
15.0000 mL | Freq: Two times a day (BID) | OROMUCOSAL | Status: DC
  Administered 2020-02-13 – 2020-02-17 (×7): 15 mL via OROMUCOSAL
  Filled 2020-02-13 (×2): qty 15

## 2020-02-13 MED ORDER — SODIUM CHLORIDE 0.9 % IV SOLN
1.0000 g | Freq: Once | INTRAVENOUS | Status: DC
  Filled 2020-02-13: qty 10

## 2020-02-13 MED ORDER — SODIUM CHLORIDE 0.9 % IV SOLN
2.0000 g | Freq: Once | INTRAVENOUS | Status: AC
  Administered 2020-02-13: 2 g via INTRAVENOUS
  Filled 2020-02-13: qty 2

## 2020-02-13 MED ORDER — SODIUM BICARBONATE 8.4 % IV SOLN
100.0000 meq | Freq: Once | INTRAVENOUS | Status: AC
  Administered 2020-02-13: 100 meq via INTRAVENOUS

## 2020-02-13 MED ORDER — HEPARIN SODIUM (PORCINE) 5000 UNIT/ML IJ SOLN
60.0000 [IU]/kg | Freq: Once | INTRAMUSCULAR | Status: DC
  Filled 2020-02-13: qty 1

## 2020-02-13 MED ORDER — INSULIN REGULAR(HUMAN) IN NACL 100-0.9 UT/100ML-% IV SOLN
INTRAVENOUS | Status: DC
  Administered 2020-02-13: 8.5 [IU]/h via INTRAVENOUS
  Administered 2020-02-14: 7.5 [IU]/h via INTRAVENOUS
  Filled 2020-02-13 (×2): qty 100

## 2020-02-13 MED ORDER — CALCIUM GLUCONATE-NACL 1-0.675 GM/50ML-% IV SOLN
1.0000 g | Freq: Once | INTRAVENOUS | Status: AC
  Administered 2020-02-13: 20:00:00 1000 mg via INTRAVENOUS
  Filled 2020-02-13: qty 50

## 2020-02-13 MED ORDER — VANCOMYCIN HCL 500 MG/100ML IV SOLN
500.0000 mg | Freq: Once | INTRAVENOUS | Status: DC
  Administered 2020-02-13: 21:00:00 500 mg via INTRAVENOUS
  Filled 2020-02-13: qty 100

## 2020-02-13 MED ORDER — ATORVASTATIN CALCIUM 20 MG PO TABS
40.0000 mg | ORAL_TABLET | Freq: Every day | ORAL | Status: DC
  Administered 2020-02-14 – 2020-02-16 (×3): 40 mg via ORAL
  Filled 2020-02-13 (×3): qty 2

## 2020-02-13 MED ORDER — STERILE WATER FOR INJECTION IV SOLN
INTRAVENOUS | Status: DC
  Filled 2020-02-13 (×4): qty 850

## 2020-02-13 MED ORDER — VANCOMYCIN HCL IN DEXTROSE 1-5 GM/200ML-% IV SOLN
1000.0000 mg | Freq: Once | INTRAVENOUS | Status: AC
  Administered 2020-02-13: 17:00:00 1000 mg via INTRAVENOUS
  Filled 2020-02-13: qty 200

## 2020-02-13 MED ORDER — INSULIN REGULAR HUMAN 100 UNIT/ML IJ SOLN
10.0000 [IU] | Freq: Once | INTRAMUSCULAR | Status: AC
  Administered 2020-02-13: 21:00:00 10 [IU] via INTRAVENOUS
  Filled 2020-02-13: qty 10

## 2020-02-13 MED ORDER — ASPIRIN 81 MG PO CHEW
324.0000 mg | CHEWABLE_TABLET | Freq: Once | ORAL | Status: AC
  Administered 2020-02-13: 16:00:00 324 mg via ORAL

## 2020-02-13 MED ORDER — MIDAZOLAM HCL 2 MG/2ML IJ SOLN: 2.0000 mg | INTRAMUSCULAR | Status: DC | PRN

## 2020-02-13 MED ORDER — SODIUM CHLORIDE 0.9 % IV SOLN: 2.0000 g | INTRAVENOUS | Status: DC

## 2020-02-13 NOTE — ED Notes (Signed)
1g calcium gluconate admistered by kelly,rn verbal order md isaacs at 1535  Bicarb administered at 1537 verbal order md isaacs

## 2020-02-13 NOTE — Progress Notes (Signed)
Spencer [name] arrived at room in response to Code STEMI PG.Hospital staff were still working on Pt. Spencer [name] left after Code was cancelled.

## 2020-02-13 NOTE — ED Notes (Signed)
Waiting on ct head results before heparin administered

## 2020-02-13 NOTE — ED Notes (Signed)
15mg  etomidate administered IV by dee,rn verbal order md isaacs

## 2020-02-13 NOTE — Progress Notes (Signed)
eLink Physician-Brief Progress Note Patient Name: Spencer [name] DOB: 1961/03/24 MRN: [id number]   Date of Service  02/13/2020  HPI/Events of Note  Pt seen in the ED as a code STEMI, with severe DKA, altered mental status, acute respiratory failure, and acute kidney injury, he also had severe hyperkalemia. Cardiology saw him but declined Rx for STEMI due to a similar presentation 10/20 with clean coronaries on cardiac cath, in both circumstances the context was drug abuse-this time Heroin.  eICU Interventions  New Patient Evaluation completed.        Thomasene Lot Jakiyah Stepney 02/13/2020, 8:15 PM
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
## 2020-02-13 NOTE — ED Notes (Signed)
Inetta Fermo, RN STEMI coordinator at bedside upon pt arrival to ed

## 2020-02-13 NOTE — H&P (Signed)
Name: Spencer [name] MRN: [id number] DOB: 1961/01/15     CONSULTATION DATE: 02/13/2020  REFERRING MD : Ellender Hose  CHIEF COMPLAINT:  Acute resp failure  BRIEF DESCRIPTION: 59 y.o. Male with PMH significant for polysubstance abuse, DKA, and medical noncompliance, who is admitted 02/13/20 with severe metabolic encephalopathy and metabolic acidosis in the setting of severe DKA, AKI with hyperkalemia, and cocaine intoxication. There is also concern for possible aspiration pneumonia.  Cardiology is following along, and consult has been placed to nephrology.  SIGNIFICANT EVENTS/STUDIES: 4/9: Admission to ICU 4/9: Cardiology and Nephrology consulted 4/9: Family made pt DNR/DNI 4/9: CT Head>>No acute intracranial pathology. 4/10: Echocardiogram 4/10: Renal US  CULTURES: SARS-CoV-2 PCR 4/9>> negative Influenza PCR 4/9>> negative MRSA PCR 4/9>> negative Blood culture x2 4/9>> Tracheal aspirate 4/9>> Strep pneumo urinary antigen 4/9>> Legionella urinary antigen 4/9>>  ANTIBIOTICS: Cefepime 4/9>>4/9 Vancomycin 4/9>>4/9 Unasyn 4/9>>  HISTORY OF PRESENT ILLNESS:   59 year old male with known history of polysubstance abuse including cocaine and amphetamine, diabetes mellitus with recurrent presentations for DKA due to poor compliance and history of acute kidney injury in the setting of DKA. He presented in October with mental status changes and was noted to have an abnormal EKG suggestive of a possible anterior STEMI.    He presents to [facility] ED today on 02/13/20 due to Altered Mental Status.  Pt is currently intubated and sedated, therefore history is obtained from ED and nursing notes.  Per notes he was reportedly found confused and altered earlier this morning by his roommate.  Roommate reports he had been using heroin last night, and had been vomiting for approximately 10 hours.  Upon EMS arrival he was essentially unresponsive, moaning, hypotensive, and EKG concerning for possible anterior STEMI,  and with peaked T waves concerning for Hyperkalemia.  He was given bicarbonate and calcium gluconate with noted improvement in his EKG changes.  Upon arrival to the ED, cardiology was consulted and assessed patient at the bedside.  Cardiology felt patient did not need to go emergently to the Cath Lab, and that his EKG changes were more likely related to metabolic abnormalities.  Given his altered mental status he was intubated by ED provider.  Initial lab work revealed glucose 1424, beta hydroxybutyric acid greater than 8, sodium 132, potassium 7.4, bicarb less than 7, creatinine 4.71, high-sensitivity troponin 138, WBC 21.4 with neutrophilia, lactic acid 5.6, procalcitonin 1.56.  Post intubation ABG with severe metabolic acidosis (7.0 / 26 / 520 / 6.6).  He was given 2 L of IV fluids, placed on insulin and bicarb drips, and received cefepime and vancomycin.  CT head is negative for any acute intracranial abnormality.  His SARS-CoV-2 and influenza PCR's are both negative.  Urinalysis is positive for ketones, negative for UTI. Urine drug screen is positive for cocaine.  Chest x-ray without any acute cardiopulmonary disease.  PCCM is asked to admit the patient to ICU for further work-up and treatment of severe metabolic encephalopathy and metabolic acidosis in the setting of severe DKA, AKI with hyperkalemia, and cocaine intoxication. There is concern for possible aspiration pneumonia.  Cardiology is following along, and consult has been placed to nephrology.    PAST MEDICAL HISTORY :   has a past medical history of Brain aneurysm, Broken bones, Diabetes mellitus without complication ([place]), Dysrhythmia, GERD (gastroesophageal reflux disease), Hypertension, and Substance abuse ([place]).  has a past surgical history that includes Neck surgery; Hernia repair; TEE without cardioversion (N/A, 06/03/2019); Implantation / placement of strip electrodes via  burr holes subdural; Esophagogastroduodenoscopy (egd) with  propofol (N/A, 06/04/2019); Coronary/Graft Acute MI Revascularization (N/A, 09/04/2019); and LEFT HEART CATH AND CORONARY ANGIOGRAPHY (N/A, 09/04/2019). Prior to Admission medications   Medication Sig Start Date End Date Taking? Authorizing Provider  aspirin EC 81 MG tablet Take 1 tablet (81 mg total) by mouth daily. 10/26/19 05/13/20 Yes Sreenath, Sudheer B, MD  insulin glargine (LANTUS) 100 unit/mL SOPN Inject 0.18 mLs (18 Units total) into the skin 2 (two) times daily. Patient taking differently: Inject 22 Units into the skin daily.  11/28/19  Yes Lorella Nimrod, MD  NOVOLOG FLEXPEN 100 UNIT/ML FlexPen Inject 0-30 Units into the skin 3 (three) times daily. 09/10/19  Yes Danford, Suann Larry, MD   No Known Allergies  FAMILY HISTORY:  family history includes CAD in his sister; Healthy in his father and mother; Hypertension in his sister. SOCIAL HISTORY:  reports that he has been smoking. He has never used smokeless tobacco. He reports current alcohol use of about 1.0 standard drinks of alcohol per week. He reports previous drug use. Drug: Cocaine.  REVIEW OF SYSTEMS:   Unable to obtain due to critical illness      Estimated body mass index is 21.03 kg/m as calculated from the following:   Height as of this encounter: 5' 10"  (1.778 m).   Weight as of this encounter: 66.5 kg.    VITAL SIGNS: Pulse Rate:  [84-175] 84 (04/09 1600) Resp:  [23-27] 23 (04/09 1600) BP: (71-125)/(52-75) 125/75 (04/09 1600) SpO2:  [94 %-100 %] 100 % (04/09 1600) FiO2 (%):  [40 %-100 %] 40 % (04/09 1708) Weight:  [66.5 kg] 66.5 kg (04/09 1539)   No intake/output data recorded. No intake/output data recorded.   SpO2: 100 % FiO2 (%): 40 %   Physical Examination:  GENERAL:critically ill appearing, laying in bed, intubated and sedated, in NAD HEAD: Normocephalic, atraumatic.  EYES: Pupils equal, round, reactive to light.  No scleral icterus.  MOUTH: Moist mucosal membrane. NECK: Supple. No JVD.    PULMONARY: Clear to auscultation bilaterally, vent assisted, even, no assessory muscle use CARDIOVASCULAR: S1 and S2. Regular rate and rhythm. No murmurs, rubs, or gallops, 2+ pulses GASTROINTESTINAL: Soft, nontender, nondistended. No guarding or rebound tenderness.  Positive bowel sounds.  MUSCULOSKELETAL: No swelling, clubbing, or edema.  NEUROLOGIC: obtunded, sedated SKIN:intact,warm,dry.  No obvious rashes, lesions, or ulcerations   MEDICATIONS: I have reviewed all medications and confirmed regimen as documented     IMAGING    DG Chest 1 View  Result Date: 02/13/2020 CLINICAL DATA:  Status post intubation. EXAM: CHEST  1 VIEW COMPARISON:  December 15, 2019. FINDINGS: The heart size and mediastinal contours are within normal limits. Endotracheal tube is in grossly good position. Nasogastric tube is seen entering right mainstem bronchus; withdrawal is recommended. No pneumothorax or pleural effusion is noted. Lungs are clear. The visualized skeletal structures are unremarkable. IMPRESSION: Endotracheal tube in grossly good position. Nasogastric tube is seen entering right mainstem bronchus; withdrawal is recommended. Critical Value/emergent results were called by telephone at the time of interpretation on 02/13/2020 at 4:22 pm to provider Duffy Bruce , who verbally acknowledged these results. Electronically Signed   By: Marijo Conception M.D.   On: 02/13/2020 16:22   DG Abdomen 1 View  Result Date: 02/13/2020 CLINICAL DATA:  NG tube EXAM: ABDOMEN - 1 VIEW COMPARISON:  07/27/2019 FINDINGS: NG tube is in the stomach.  Nonobstructive bowel gas pattern. IMPRESSION: NG tube in the stomach. Electronically Signed  By: Rolm Baptise M.D.   On: 02/13/2020 17:11   CT Head Wo Contrast  Result Date: 02/13/2020 CLINICAL DATA:  59 year old male with encephalopathy. EXAM: CT HEAD WITHOUT CONTRAST TECHNIQUE: Contiguous axial images were obtained from the base of the skull through the vertex without  intravenous contrast. COMPARISON:  Head CT dated 08/19/2019. FINDINGS: Brain: The ventricles and sulci appropriate size for patient's age. Probable tiny old lacunar infarct in the left lentiform nucleus. The gray-white matter discrimination is preserved. There is no acute intracranial hemorrhage. No mass effect or midline shift. No extra-axial fluid collection. Vascular: No hyperdense vessel or unexpected calcification. Skull: No acute calvarial pathology. Left frontal and parietal burr holes. Sinuses/Orbits: The visualized paranasal sinuses and mastoid air cells are clear. Cerumen noted in the right external auditory canal. Other: None IMPRESSION: No acute intracranial pathology. Electronically Signed   By: Anner Crete M.D.   On: 02/13/2020 16:44     Indwelling Urinary Catheter continued, requirement due to   Reason to continue Indwelling Urinary Catheter strict Intake/Output monitoring for hemodynamic instability         Ventilator continued, requirement due to severe respiratory failure   Ventilator Sedation RASS 0 to -2       ASSESSMENT AND PLAN  Severe acute hypoxic and hypercapnic respiratory failure due to severe metabolic acidosis  -Continue full vent support -Wean FiO2 and PEEP as tolerated to maintain an O2 sats greater than 92% -Follow intermittent chest x-ray and ABG as needed -Continue to follow serial chest x-ray for possible development of aspiration pneumonia -VAP protocol -As needed bronchodilators -Spontaneous breathing trials when respiratory parameters met and mental status permits  Severe DKA -Follow DKA protocol -Aggressive IV fluids -Insulin drip -Follow BMP every 4 hours -Once anion gap closed and serum bicarb greater than 20 can convert to sliding scale insulin and long-acting insulin -Check hemoglobin A1c -Consult diabetes coordinator, appreciate input   ? 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 Juleen China -May need emergent hemodialysis depending on response of potassium -Obtain renal ultrasound -Trend lactic acid  Leukocytosis with Neutrophilia in setting of ? Aspiration Pneumonia -Monitor fever curve -Trend WBC's and Procalcitonin -Follow cultures as above -Received Cefepime & Vancomycin in ED -Place on empiric Unasyn for suspected aspiration PNA  Acute metabolic encephalopathy in setting of severe DKA, AKI, and Cocaine intoxication Hx: Polysubstance abuse -Maintain RASS of 0 to -1 -Fentanyl gtt to maintain RASS goal -Provide Supportive care -Treat DKA -CT Head negative 02/13/20 -Check Urine drug screen>> + for cocaine -Daily wake-up assessment -Encourage susbstance abuse cessation once extubated       BEST PRACTICES: DISPOSITION: ICU GOALS OF CARE: DNR/DNI (discussed with pt's sister Luciano Cutter.  She reports she is closest family member and that she helps to care for him.  She understands that he is critically ill with very poor prognosis.  She expresses her frustration with the pt's continued drug abuse and medical noncompliance.  She requests he be made DNR/DNI. VTE PROPHYLAXIS/ANTICOAGULATION: Heparin gtt STRESS ULCER  PROPHYLAXIS: Pepcid IV UPDATES: Updated pt's sister Gasper Lloyd via telephone 02/13/20 CONSULTS: Nephrology, Diabetes coordinator  Critical Care Time devoted to patient care services described in this note is 45 minutes.   Overall, patient is critically ill, prognosis is guarded.  Patient with Multiorgan failure and at high risk for cardiac arrest and death.    Darel Hong, AGACNP-BC [facility] Pager: [phone]

## 2020-02-13 NOTE — Consult Note (Signed)
ANTICOAGULATION CONSULT NOTE   Pharmacy Consult for Heparin  Indication: ACS / STEMI  No Known Allergies  Patient Measurements: Height: 5\' 10"  (177.8 cm) Weight: 66.5 kg (146 lb 9.6 oz) IBW/kg (Calculated) : 73 Heparin Dosing Weight: 65.5 kg  Vital Signs: BP: 125/75 (04/09 1600) Pulse Rate: 84 (04/09 1600)  Labs: Recent Labs    02/13/20 1535 02/13/20 1602  HGB 14.1  --   HCT 52.8*  --   PLT 309  --   APTT 29  --   LABPROT 12.2  --   INR 0.9  --   CREATININE  --  4.71*  TROPONINIHS  --  138*    Estimated Creatinine Clearance: 15.5 mL/min (A) (by C-G formula based on SCr of 4.71 mg/dL (H)).   Medications:  No PTA anticoagulant -confirmed with nurse  Assessment: Pharmacy has been consulted in heparin dosing in patient with ACS. Baseline labs ordered and appropriate.    Goal of Therapy:  Heparin level 0.3-0.7 units/ml Monitor platelets by anticoagulation protocol: Yes   Plan:  Heparin DW: 65.5 kg Give 3900 units bolus x 1 Start heparin infusion at 750 units/hr Check anti-Xa level in 8 hours (CrCl < 30 mL/min) and daily while on heparin, per protocol Continue to monitor H&H and platelets  Arina Torry R Mihaela Fajardo 02/13/2020,5:26 PM

## 2020-02-13 NOTE — ED Notes (Signed)
1g calcium gluconate given IV via verbal order md isaacs

## 2020-02-13 NOTE — ED Provider Notes (Signed)
[facility] Emergency Department Provider Note  ____________________________________________   First MD Initiated Contact with Patient 02/13/20 1600     (approximate)  I have reviewed the triage vital signs and the nursing notes.   HISTORY  Chief Complaint Code STEMI    HPI Spencer [name] is a 59 y.o. male who arrives with altered mental status.  Patient arrived via EMS.  He reportedly was found unresponsive.  Per report, he had been using heroin last night, was found confused and altered this morning.  With EMS, patient was essentially unresponsive, moaning, hypotensive, with EKG concerning for ST elevation.  He was activated as a code STEMI and brought to the ED.  On arrival, patient is moaning.  He is altered, unable to provide additional history.  Level 5 caveat invoked as remainder of history, ROS, and physical exam limited due to patient's AMS.         Past Medical History:  Diagnosis Date  . Brain aneurysm   . Broken bones    clavical, ankle, arms toes wriast   . Diabetes mellitus without complication ([place])   . Dysrhythmia   . GERD (gastroesophageal reflux disease)   . Hypertension   . Substance abuse [facility])     Patient Active Problem List   Diagnosis Date Noted  . Respiratory failure ([place]) 02/13/2020  . Weakness   . Lobar pneumonia ([place])   . Hyperglycemia   . Tobacco abuse counseling   . Atrial fibrillation with RVR ([place]) 08/07/2019  . GERD (gastroesophageal reflux disease) 08/07/2019  . Acute renal failure ([place])   . Hyperkalemia   . Sepsis ([place]) 06/09/2019  . AKI (acute kidney injury) ([place]) 02/19/2019  . Hypoglycemia 10/23/2018  . Chronic low back pain 01/16/2017  . Chronic neck pain 01/16/2017  . Diabetic neuropathy ([place]) 01/16/2017  . DM2 (diabetes mellitus, type 2) ([place]) 01/16/2017  . History of cocaine abuse ([place]) 01/16/2017  . Personal history of subdural hematoma 01/16/2017  . Closed displaced fracture of body of left  calcaneus with delayed healing 12/13/2016  . Protein-calorie malnutrition, severe 11/08/2016  . DKA, type 2 ([place]) 11/06/2016  . DKA (diabetic ketoacidoses) ([place]) 04/03/2015  . Hypertension 04/03/2015  . Depression 04/03/2015  . Opiate dependence ([place]) 04/03/2015  . Tobacco abuse 04/03/2015  . Lumbosacral neuritis 07/19/2014  . Pain of finger of right hand 07/19/2014  . Type II or unspecified type diabetes mellitus without mention of complication, not stated as uncontrolled 07/19/2014  . Knee pain 09/12/2013  . Hernia of flank 09/20/2012  . Epidermoid cyst of skin 09/20/2012  . Chronic pain of both shoulders 03/27/2012    Past Surgical History:  Procedure Laterality Date  . CORONARY/GRAFT ACUTE MI REVASCULARIZATION N/A 09/04/2019   Procedure: Coronary/Graft Acute MI Revascularization;  Surgeon: Yolonda Kida, MD;  Location: [place] [facility] LAB;  Service: Cardiovascular;  Laterality: N/A;  . ESOPHAGOGASTRODUODENOSCOPY (EGD) WITH PROPOFOL N/A 06/04/2019   Procedure: ESOPHAGOGASTRODUODENOSCOPY (EGD) WITH PROPOFOL;  Surgeon: Toledo, Benay Pike, MD;  Location: [facility] ENDOSCOPY;  Service: Gastroenterology;  Laterality: N/A;  . HERNIA REPAIR    . IMPLANTATION / PLACEMENT OF STRIP ELECTRODES VIA BURR HOLES SUBDURAL     anyrusum  . LEFT HEART CATH AND CORONARY ANGIOGRAPHY N/A 09/04/2019   Procedure: LEFT HEART CATH AND CORONARY ANGIOGRAPHY;  Surgeon: Yolonda Kida, MD;  Location: [place] [facility];  Service: Cardiovascular;  Laterality: N/A;  . NECK SURGERY    . TEE WITHOUT CARDIOVERSION N/A 06/03/2019  Procedure: TRANSESOPHAGEAL ECHOCARDIOGRAM (TEE);  Surgeon: Dalia HeadingFath, Kenneth A, MD;  Location: [facility] ORS;  Service: Cardiovascular;  Laterality: N/A;    Prior to Admission medications   Medication Sig Start Date End Date Taking? Authorizing Provider  aspirin EC 81 MG tablet Take 1 tablet (81 mg total) by mouth daily. 10/26/19 05/13/20 Yes Sreenath, Sudheer B, MD  insulin glargine (LANTUS)  100 unit/mL SOPN Inject 0.18 mLs (18 Units total) into the skin 2 (two) times daily. Patient taking differently: Inject 22 Units into the skin daily.  11/28/19  Yes Arnetha CourserAmin, Sumayya, MD  NOVOLOG FLEXPEN 100 UNIT/ML FlexPen Inject 0-30 Units into the skin 3 (three) times daily. 09/10/19  Yes Danford, Earl Liteshristopher P, MD    Allergies Patient has no known allergies.  Family History  Problem Relation Age of Onset  . CAD Sister   . Hypertension Sister   . Healthy Mother   . Healthy Father     Social History Social History   Tobacco Use  . Smoking status: Current Some Day Smoker  . Smokeless tobacco: Never Used  Substance Use Topics  . Alcohol use: Yes    Alcohol/week: 1.0 standard drinks    Types: 1 Cans of beer per week  . Drug use: Not Currently    Types: Cocaine    Review of Systems  Review of Systems  Unable to perform ROS: Mental status change     ____________________________________________  PHYSICAL EXAM:      VITAL SIGNS: ED Triage Vitals  Enc Vitals Group     BP 02/13/20 1536 (!) 71/52     Pulse Rate 02/13/20 1536 (!) 163     Resp 02/13/20 1536 (!) 27     Temp --      Temp src --      SpO2 02/13/20 1536 96 %     Weight 02/13/20 1539 146 lb 9.6 oz (66.5 kg)     Height 02/13/20 1539 5\' 10"  (1.778 m)     Head Circumference --      Peak Flow --      Pain Score 02/13/20 1539 0     Pain Loc --      Pain Edu? --      Excl. in GC? --      Physical Exam Vitals and nursing note reviewed.  Constitutional:      General: He is not in acute distress.    Appearance: He is well-developed. He is ill-appearing.  HENT:     Head: Normocephalic and atraumatic.     Mouth/Throat:     Mouth: Mucous membranes are dry.  Eyes:     Conjunctiva/sclera: Conjunctivae normal.  Cardiovascular:     Rate and Rhythm: Regular rhythm. Tachycardia present.     Heart sounds: Normal heart sounds.  Pulmonary:     Effort: Respiratory distress present.     Breath sounds: No wheezing.      Comments: Tachypneic, agonal respirations Abdominal:     General: Abdomen is flat. There is no distension.  Musculoskeletal:     Cervical back: Neck supple.  Skin:    General: Skin is warm.     Capillary Refill: Capillary refill takes 2 to 3 seconds.     Findings: No rash.  Neurological:     Mental Status: He is alert. He is disoriented.     Motor: No abnormal muscle tone.     Comments: Moaning, does occasionally withdrawal to painful stimuli bilateral upper and lower extremities.  Unable to provide  additional history       ____________________________________________   LABS (all labs ordered are listed, but only abnormal results are displayed)  Labs Reviewed  GLUCOSE, CAPILLARY - Abnormal; Notable for the following components:      Result Value   Glucose-Capillary >600 (*)    All other components within normal limits  CBC WITH DIFFERENTIAL/PLATELET - Abnormal; Notable for the following components:   WBC 21.4 (*)    HCT 52.8 (*)    MCV 118.4 (*)    MCHC 26.7 (*)    Neutro Abs 17.9 (*)    Monocytes Absolute 1.5 (*)    Abs Immature Granulocytes 0.41 (*)    All other components within normal limits  LACTIC ACID, PLASMA - Abnormal; Notable for the following components:   Lactic Acid, Venous 5.6 (*)    All other components within normal limits  LACTIC ACID, PLASMA - Abnormal; Notable for the following components:   Lactic Acid, Venous 2.7 (*)    All other components within normal limits  BETA-HYDROXYBUTYRIC ACID - Abnormal; Notable for the following components:   Beta-Hydroxybutyric Acid >8.00 (*)    All other components within normal limits  URINALYSIS, COMPLETE (UACMP) WITH MICROSCOPIC - Abnormal; Notable for the following components:   Color, Urine YELLOW (*)    APPearance CLEAR (*)    Glucose, UA >=500 (*)    Hgb urine dipstick SMALL (*)    Ketones, ur 20 (*)    Protein, ur 30 (*)    Bacteria, UA RARE (*)    All other components within normal limits  COMPREHENSIVE  METABOLIC PANEL - Abnormal; Notable for the following components:   Sodium 132 (*)    Potassium 7.4 (*)    Chloride 93 (*)    CO2 <7 (*)    Glucose, Bld 1,424 (*)    BUN 90 (*)    Creatinine, Ser 4.71 (*)    Total Protein 6.4 (*)    Total Bilirubin 2.3 (*)    GFR calc non Af Amer 12 (*)    GFR calc Af Amer 14 (*)    All other components within normal limits  LIPID PANEL - Abnormal; Notable for the following components:   Cholesterol 230 (*)    Triglycerides 634 (*)    HDL 29 (*)    All other components within normal limits  BLOOD GAS, ARTERIAL - Abnormal; Notable for the following components:   pH, Arterial 7.00 (*)    pCO2 arterial 26 (*)    pO2, Arterial 520 (*)    Bicarbonate 6.6 (*)    Acid-base deficit 24.0 (*)    All other components within normal limits  GLUCOSE, CAPILLARY - Abnormal; Notable for the following components:   Glucose-Capillary >600 (*)    All other components within normal limits  GLUCOSE, CAPILLARY - Abnormal; Notable for the following components:   Glucose-Capillary >600 (*)    All other components within normal limits  TROPONIN I (HIGH SENSITIVITY) - Abnormal; Notable for the following components:   Troponin I (High Sensitivity) 138 (*)    All other components within normal limits  TROPONIN I (HIGH SENSITIVITY) - Abnormal; Notable for the following components:   Troponin I (High Sensitivity) 115 (*)    All other components within normal limits  CULTURE, BLOOD (ROUTINE X 2)  CULTURE, BLOOD (ROUTINE X 2)  RESPIRATORY PANEL BY RT PCR (FLU A&B, COVID)  PROTIME-INR  APTT  LIPASE, BLOOD  PROCALCITONIN  HEMOGLOBIN A1C  LDL CHOLESTEROL, DIRECT  CBC  BASIC METABOLIC PANEL  BLOOD GAS, ARTERIAL  HEPARIN LEVEL (UNFRACTIONATED)  LIPID PANEL  MAGNESIUM  PHOSPHORUS  CBG MONITORING, ED    ____________________________________________  EKG: Wide-complex tachycardia with heart rate 189.  QRS 165, QTc unable to accurately calculate.  There appears to be  ST elevations in V1 and V2, with diffuse peaked T waves.  There is flattening of the PR interval. ________________________________________  RADIOLOGY All imaging, including plain films, CT scans, and ultrasounds, independently reviewed by me, and interpretations confirmed via formal radiology reads.  ED MD interpretation:   Chest x-ray: ET tube in appropriate place, NG tube appears to be in the right mainstem, this was removed KUB: Replaced NG tube appears to be in the stomach CT head: No acute abnormality  Official radiology report(s): DG Chest 1 View  Result Date: 02/13/2020 CLINICAL DATA:  Status post intubation. EXAM: CHEST  1 VIEW COMPARISON:  December 15, 2019. FINDINGS: The heart size and mediastinal contours are within normal limits. Endotracheal tube is in grossly good position. Nasogastric tube is seen entering right mainstem bronchus; withdrawal is recommended. No pneumothorax or pleural effusion is noted. Lungs are clear. The visualized skeletal structures are unremarkable. IMPRESSION: Endotracheal tube in grossly good position. Nasogastric tube is seen entering right mainstem bronchus; withdrawal is recommended. Critical Value/emergent results were called by telephone at the time of interpretation on 02/13/2020 at 4:22 pm to provider Shaune Pollack , who verbally acknowledged these results. Electronically Signed   By: Lupita Raider M.D.   On: 02/13/2020 16:22   DG Abdomen 1 View  Result Date: 02/13/2020 CLINICAL DATA:  NG tube EXAM: ABDOMEN - 1 VIEW COMPARISON:  07/27/2019 FINDINGS: NG tube is in the stomach.  Nonobstructive bowel gas pattern. IMPRESSION: NG tube in the stomach. Electronically Signed   By: Charlett Nose M.D.   On: 02/13/2020 17:11   CT Head Wo Contrast  Result Date: 02/13/2020 CLINICAL DATA:  59 year old male with encephalopathy. EXAM: CT HEAD WITHOUT CONTRAST TECHNIQUE: Contiguous axial images were obtained from the base of the skull through the vertex without  intravenous contrast. COMPARISON:  Head CT dated 08/19/2019. FINDINGS: Brain: The ventricles and sulci appropriate size for patient's age. Probable tiny old lacunar infarct in the left lentiform nucleus. The gray-white matter discrimination is preserved. There is no acute intracranial hemorrhage. No mass effect or midline shift. No extra-axial fluid collection. Vascular: No hyperdense vessel or unexpected calcification. Skull: No acute calvarial pathology. Left frontal and parietal burr holes. Sinuses/Orbits: The visualized paranasal sinuses and mastoid air cells are clear. Cerumen noted in the right external auditory canal. Other: None IMPRESSION: No acute intracranial pathology. Electronically Signed   By: Elgie Collard M.D.   On: 02/13/2020 16:44    ____________________________________________  PROCEDURES   Procedure(s) performed (including Critical Care):  .Critical Care Performed by: Shaune Pollack, MD Authorized by: Shaune Pollack, MD   Critical care provider statement:    Critical care time (minutes):  45   Critical care time was exclusive of:  Separately billable procedures and treating other patients and teaching time   Critical care was necessary to treat or prevent imminent or life-threatening deterioration of the following conditions:  Cardiac failure, circulatory failure, sepsis and metabolic crisis   Critical care was time spent personally by me on the following activities:  Development of treatment plan with patient or surrogate, discussions with consultants, evaluation of patient's response to treatment, examination of patient, obtaining history from patient or surrogate, ordering and performing treatments  and interventions, ordering and review of laboratory studies, ordering and review of radiographic studies, pulse oximetry, re-evaluation of patient's condition and review of old charts   I assumed direction of critical care for this patient from another provider in my  specialty: no   Procedure Name: Intubation Date/Time: 02/13/2020 6:45 PM Performed by: Shaune Pollack, MD Pre-anesthesia Checklist: Patient identified, Patient being monitored, Emergency Drugs available, Timeout performed and Suction available Oxygen Delivery Method: Non-rebreather mask Preoxygenation: Pre-oxygenation with 100% oxygen Induction Type: Rapid sequence Ventilation: Mask ventilation without difficulty Laryngoscope Size: Glidescope and 3 Grade View: Grade I Number of attempts: 1 Airway Equipment and Method: Video-laryngoscopy and Rigid stylet Placement Confirmation: ETT inserted through vocal cords under direct vision,  CO2 detector and Breath sounds checked- equal and bilateral Tube secured with: ETT holder Dental Injury: Teeth and Oropharynx as per pre-operative assessment  Difficulty Due To: Difficulty was unanticipated Future Recommendations: Recommend- induction with short-acting agent, and alternative techniques readily available    .1-3 Lead EKG Interpretation Performed by: Shaune Pollack, MD Authorized by: Shaune Pollack, MD     Interpretation: normal     ECG rate assessment: normal     Rhythm: sinus rhythm     Ectopy: PVCs     Conduction: abnormal   Comments:     Indication: Severe illness, respiratory failure, hyperkalemia, possible STEMI    ____________________________________________  INITIAL IMPRESSION / MDM / ASSESSMENT AND PLAN / ED COURSE  As part of my medical decision making, I reviewed the following data within the electronic MEDICAL RECORD NUMBER Nursing notes reviewed and incorporated, Old chart reviewed, Notes from prior ED visits, and [place] Controlled Substance Database       *DECODA VAN was evaluated in Emergency Department on 02/13/2020 for the symptoms described in the history of present illness. He was evaluated in the context of the global COVID-19 pandemic, which necessitated consideration that the patient might be at risk for infection  with the SARS-CoV-2 virus that causes COVID-19. Institutional protocols and algorithms that pertain to the evaluation of patients at risk for COVID-19 are in a state of rapid change based on information released by regulatory bodies including the CDC and federal and state organizations. These policies and algorithms were followed during the patient's care in the ED.  Some ED evaluations and interventions may be delayed as a result of limited staffing during the pandemic.*     Medical Decision Making: 60 year old male here with altered mental status.  On arrival, patient was activated as a code STEMI.  He does have ST elevations initially in V1 and V2, but he also has diffuse widened QRS, QT prolongation, peaked T waves, and high blood sugars, with primary concern for severe metabolic derangement, likely DKA with hyperkalemia based on his prior ED visits.  Patient was given multiple doses of calcium, bicarb, with progressive improvement in his EKG.  He has persistent ST elevation in V1, but no concomitant elevations and Dr. Kirke Corin is at bedside, does not feel patient needs to go emergently to the Cath Lab.  Given his altered mental status with severe illness, intubation performed as above.  He was maintained with hyperventilation due to likely compensation for metabolic acidosis.  Initial lab work confirms this, with profound hyperglycemia, hyperkalemia, and acute kidney injury.  He was started on insulin drip and temporized with 5 units insulin IV in addition to calcium, bicarb, and albuterol.  He does seem to be producing urine, which is reassuring.  He was given 2 L  of fluid and started on high-dose maintenance fluids.  He also has a significant lactic acidosis which I suspect is due to his metabolic derangements and hypoperfusion, but will be started on empiric antibiotics.  Given his ST elevations and troponin elevation, after CT head was confirmed negative, will also start empiric heparin. Admit to the  ICU.  ____________________________________________  FINAL CLINICAL IMPRESSION(S) / ED DIAGNOSES  Final diagnoses:  Diabetic ketoacidosis with coma associated with type 1 diabetes mellitus (HCC)  AKI (acute kidney injury) (HCC)  Hyperkalemia     MEDICATIONS GIVEN DURING THIS VISIT:  Medications  0.9 %  sodium chloride infusion ( Intravenous New Bag/Given 02/13/20 1546)  fentaNYL in NS (19mcg/ml) infusion-PREMIX (100 mcg/hr Intravenous Rate/Dose Change 02/13/20 1830)  midazolam (VERSED) injection 2 mg (2 mg Intravenous Given 02/13/20 1758)  midazolam (VERSED) injection 2 mg (has no administration in time range)  insulin regular, human (MYXREDLIN) 100 units/ 100 mL infusion (8.5 Units/hr Intravenous New Bag/Given 02/13/20 1737)  dextrose 5 %-0.45 % sodium chloride infusion (has no administration in time range)  dextrose 50 % solution 0-50 mL (has no administration in time range)  sodium bicarbonate 150 mEq in sterile water 1,000 mL infusion ( Intravenous New Bag/Given 02/13/20 1757)  albuterol (PROVENTIL) (2.5 MG/3ML) 0.083% nebulizer solution 5 mg (has no administration in time range)  sodium chloride flush (NS) 0.9 % injection 3 mL (has no administration in time range)  sodium chloride flush (NS) 0.9 % injection 3 mL (has no administration in time range)  0.9 %  sodium chloride infusion (has no administration in time range)  acetaminophen (TYLENOL) tablet 650 mg (has no administration in time range)  docusate sodium (COLACE) capsule 100 mg (has no administration in time range)  polyethylene glycol (MIRALAX / GLYCOLAX) packet 17 g (has no administration in time range)  ondansetron (ZOFRAN) injection 4 mg (has no administration in time range)  famotidine (PEPCID) IVPB 20 mg premix (has no administration in time range)  heparin bolus via infusion 3,900 Units (has no administration in time range)  heparin ADULT infusion 100 units/mL (25000 units/292mL sodium chloride 0.45%) (has no  administration in time range)  aspirin chewable tablet 81 mg (has no administration in time range)  atorvastatin (LIPITOR) tablet 40 mg (has no administration in time range)  calcium gluconate 1 g/ 50 mL sodium chloride IVPB (has no administration in time range)  aspirin chewable tablet 324 mg (324 mg Oral Given 02/13/20 1546)  vancomycin (VANCOCIN) IVPB 1000 mg/200 mL premix (0 mg Intravenous Stopped 02/13/20 1829)  ceFEPIme (MAXIPIME) 2 g in sodium chloride 0.9 % 100 mL IVPB (0 g Intravenous Stopped 02/13/20 1745)     ED Discharge Orders    None       Note:  This document was prepared using Dragon voice recognition software and may include unintentional dictation errors.   Shaune Pollack, MD 02/13/20 [phone]

## 2020-02-13 NOTE — ED Notes (Signed)
2nd liter normal saline bolus given verbal rder md isaacs

## 2020-02-13 NOTE — ED Notes (Signed)
5 units insulin given IV by dee,rn with verbal order md isaacs

## 2020-02-13 NOTE — ED Notes (Signed)
1L normal saline bolus initiated at this time. Verbal order md isaacs.

## 2020-02-13 NOTE — Consult Note (Signed)
PHARMACY CONSULT NOTE   Pharmacy Consult for Electrolyte Monitoring and Replacement; glucose monitoring, and constipation  Recent Labs: Potassium (mmol/L)  Date Value  02/13/2020 7.4 (HH)   Magnesium (mg/dL)  Date Value  38/32/9191 2.8 (H)   Calcium (mg/dL)  Date Value  66/04/44 9.4   Albumin (g/dL)  Date Value  99/77/4142 4.0   Phosphorus (mg/dL)  Date Value  39/53/2023 3.8   Sodium (mmol/L)  Date Value  02/13/2020 132 (L)     Assessment: 59 year old male with known history of polysubstance abuse including cocaine and amphetamine, diabetes mellitus with recurrent presentation with DKA due to poor compliance and history of acute kidney injury in the setting of DKA.  Electrolytes: K 7.4 Scr 4.71 (baseline 1.10) Na 132 Mg pending  Phosphorus pending   Goal of Electrolytes:  Magnesium ~2, Potassium ~ 4   Glucose: -Insulin regular continuous infusion  -BG is significantly elevated @ > 600   Constipation  - Miralax PRN  - LBM unknown, admitted 4/9 - Pharmacy will continue to monitor  Plan:  1. Will monitor BMP every 4 hours while on insulin infusion.   2. Will order Magnesium and phosphorus as add-ons.   Katha Cabal ,PharmD Clinical Pharmacist 02/13/2020 6:40 PM

## 2020-02-13 NOTE — Consult Note (Signed)
PHARMACY -  BRIEF ANTIBIOTIC NOTE   Pharmacy has received consult(s) for Vancomycin and Cefepime from an ED provider.  The patient's profile has been reviewed for ht/wt/allergies/indication/available labs.    One time order(s) placed for Cefepime 2g x1 and Vancomycin 1g x1   Further antibiotics/pharmacy consults should be ordered by admitting physician if indicated.                       Thank you, Katha Cabal 02/13/2020  4:44 PM

## 2020-02-13 NOTE — Consult Note (Signed)
Cardiology Consultation:   Patient ID: Spencer [name] MRN: [id number]; DOB: 06/09/1961  Admit date: 02/13/2020 Date of Consult: 02/13/2020  Primary Care Provider: Tejan-Sie, S Ahmed, MD Primary Cardiologist: No primary care provider on file.  The patient was seen by Dr. Callwood during previous admissions Primary Electrophysiologist:  None    Patient Profile:   Spencer [name] is a 59 y.o. male with a hx of diabetes mellitus with poor compliance and polysubstance abuse who is being seen today for the evaluation of possible STEMI at the request of Dr. Issacs.  History of Present Illness:   [name] is a 59-year-old male with known history of polysubstance abuse including cocaine and amphetamine, diabetes mellitus with recurrent presentation with DKA due to poor compliance and history of acute kidney injury in the setting of DKA. The patient had multiple hospitalizations in the last few years related to DKA due to poor compliance.  He presented in October with mental status changes and was noted to have an abnormal EKG suggestive of a possible anterior STEMI.  Thus, emergent cardiac catheterization was performed by Dr. Callwood which showed minimal nonobstructive coronary artery disease with normal ejection fraction.  His EKG changes were felt to be due to metabolic abnormalities. The patient was found down by his roommate today who mentioned that the patient was vomiting for 10 hours with possible heroin use.  An EKG was performed by EMS which showed possible anterior ST elevation similar to what he had back in October.  He was also noted to have peaked T waves suggestive of hyperkalemia.  He was given bicarbonate and calcium gluconate with significant improvement in his EKG changes.  The patient continued to have mental status change and was intubated for airway protection.   Past Medical History:  Diagnosis Date  . Brain aneurysm   . Broken bones    clavical, ankle, arms toes wriast   . Diabetes  mellitus without complication (HCC)   . Dysrhythmia   . GERD (gastroesophageal reflux disease)   . Hypertension   . Substance abuse (HCC)     Past Surgical History:  Procedure Laterality Date  . CORONARY/GRAFT ACUTE MI REVASCULARIZATION N/A 09/04/2019   Procedure: Coronary/Graft Acute MI Revascularization;  Surgeon: Callwood, Dwayne D, MD;  Location: [facility]IVE [facility];  Service: Cardiovascular;  Laterality: N/A;  . ESOPHAGOGASTRODUODENOSCOPY (EGD) WITH PROPOFOL N/A 06/04/2019   Procedure: ESOPHAGOGASTRODUODENOSCOPY (EGD) WITH PROPOFOL;  Surgeon: Toledo, Teodoro K, MD;  Location: [facility] ENDOSCOPY;  Service: Gastroenterology;  Laterality: N/A;  . HERNIA REPAIR    . IMPLANTATION / PLACEMENT OF STRIP ELECTRODES VIA BURR HOLES SUBDURAL     anyrusum  . LEFT HEART CATH AND CORONARY ANGIOGRAPHY N/A 09/04/2019   Procedure: LEFT HEART CATH AND CORONARY ANGIOGRAPHY;  Surgeon: Callwood, Dwayne D, MD;  Location: [facility] CV [facility];  Service: Cardiovascular;  Laterality: N/A;  . NECK SURGERY    . TEE WITHOUT CARDIOVERSION N/A 06/03/2019   Procedure: TRANSESOPHAGEAL ECHOCARDIOGRAM (TEE);  Surgeon: Fath, Kenneth A, MD;  Location: [facility] ORS;  Service: Cardiovascular;  Laterality: N/A;     Home Medications:  Prior to Admission medications   Medication Sig Start Date End Date Taking? Authorizing Provider  aspirin EC 81 MG tablet Take 1 tablet (81 mg total) by mouth daily. 10/26/19 05/13/20  Sreenath, Sudheer B, MD  blood glucose meter kit and supplies KIT Dispense based on patient and insurance preference. Use up to four times daily as directed. (FOR ICD-9 250.00, 250.01). 10/26/19     Sreenath, Sudheer B, MD  insulin glargine (LANTUS) 100 unit/mL SOPN Inject 0.18 mLs (18 Units total) into the skin 2 (two) times daily. 11/28/19   Amin, Sumayya, MD  metFORMIN (GLUCOPHAGE) 1000 MG tablet Take 1,000 mg by mouth 2 (two) times daily.    [provider]  NOVOLOG FLEXPEN 100 UNIT/ML FlexPen Inject 0-30 Units  into the skin 3 (three) times daily. 09/10/19   Danford, Christopher P, MD  pantoprazole (PROTONIX) 40 MG tablet Take 40 mg by mouth daily.    [provider]  sitaGLIPtin (JANUVIA) 50 MG tablet Take 50 mg by mouth daily.    [provider]    Inpatient Medications: Scheduled Meds:  Continuous Infusions: . sodium chloride 20 mL/hr at 02/13/20 1546  . fentaNYL infusion INTRAVENOUS 25 mcg/hr (02/13/20 1610)   PRN Meds: midazolam, midazolam  Allergies:   No Known Allergies  Social History:   Social History   Socioeconomic History  . Marital status: Single    Spouse name: Not on file  . Number of children: Not on file  . Years of education: Not on file  . Highest education level: Not on file  Occupational History  . Not on file  Tobacco Use  . Smoking status: Current Some Day Smoker  . Smokeless tobacco: Never Used  Substance and Sexual Activity  . Alcohol use: Yes    Alcohol/week: 1.0 standard drinks    Types: 1 Cans of beer per week  . Drug use: Not Currently    Types: Cocaine  . Sexual activity: Not on file  Other Topics Concern  . Not on file  Social History Narrative   ** Merged History Encounter **       Social Determinants of Health   Financial Resource Strain:   . Difficulty of Paying Living Expenses:   Food Insecurity:   . Worried About Running Out of Food in the Last Year:   . Ran Out of Food in the Last Year:   Transportation Needs:   . Lack of Transportation (Medical):   . Lack of Transportation (Non-Medical):   Physical Activity:   . Days of Exercise per Week:   . Minutes of Exercise per Session:   Stress:   . Feeling of Stress :   Social Connections:   . Frequency of Communication with Friends and Family:   . Frequency of Social Gatherings with Friends and Family:   . Attends Religious Services:   . Active Member of Clubs or Organizations:   . Attends Club or Organization Meetings:   . Marital Status:   Intimate Partner  Violence:   . Fear of Current or Ex-Partner:   . Emotionally Abused:   . Physically Abused:   . Sexually Abused:     Family History:    Family History  Problem Relation Age of Onset  . CAD Sister   . Hypertension Sister   . Healthy Mother   . Healthy Father      ROS:  Please see the history of present illness.   All other ROS reviewed and negative.     Physical Exam/Data:   Vitals:   02/13/20 1539 02/13/20 1550 02/13/20 1556 02/13/20 1600  BP:  (!) 87/63 (!) 87/63 125/75  Pulse:  (!) 146 (!) 175 84  Resp:  (!) 23 (!) 25 (!) 23  SpO2:  100% 94% 100%  Weight: 66.5 kg     Height: 5' 10" (1.778 m)      No intake or output   data in the 24 hours ending 02/13/20 1611 Last 3 Weights 02/13/2020 12/15/2019 11/28/2019  Weight (lbs) 146 lb 9.6 oz 144 lb 6.4 oz 144 lb 6.4 oz  Weight (kg) 66.497 kg 65.499 kg 65.499 kg     Body mass index is 21.03 kg/m.  General: Critically ill-appearing patient.  Currently intubated and sedated. HEENT: normal Lymph: no adenopathy Neck: no JVD Endocrine:  No thryomegaly Vascular: No carotid bruits; Cardiac:  normal S1, S2; RRR; no murmur  Lungs: Diminished breath sounds bilaterally with rhonchi Abd: soft, nontender, no hepatomegaly  Ext: no edema Musculoskeletal:  No deformities, BUE and BLE strength normal and equal Skin: warm and dry  Neuro:  CNs 2-12 intact, no focal abnormalities noted Psych:  Normal affect   EKG:  The EKG was personally reviewed and demonstrates: Sinus tachycardia with initially possible anterior ST elevation and peaked T waves.  After given bicarbonate and calcium gluconate, his EKG showed significant improvement with only residual ST elevation in V1, aVR and aVL which does not meet criteria for a STEMI. Telemetry:  Telemetry was personally reviewed and demonstrates:    Relevant CV Studies: I personally reviewed the images of his prior cardiac catheterization done in October 2020 which showed only minimal irregularities  with no obstructive disease with normal ejection fraction.  Laboratory Data:  High Sensitivity Troponin:  No results for input(s): TROPONINIHS in the last 720 hours.   ChemistryNo results for input(s): NA, K, CL, CO2, GLUCOSE, BUN, CREATININE, CALCIUM, GFRNONAA, GFRAA, ANIONGAP in the last 168 hours.  No results for input(s): PROT, ALBUMIN, AST, ALT, ALKPHOS, BILITOT in the last 168 hours. Hematology Recent Labs  Lab 02/13/20 1535  WBC 21.4*  RBC 4.46  HGB 14.1  HCT 52.8*  MCV 118.4*  MCH 31.6  MCHC 26.7*  RDW 12.7  PLT 309   BNPNo results for input(s): BNP, PROBNP in the last 168 hours.  DDimer No results for input(s): DDIMER in the last 168 hours.   Radiology/Studies:  No results found.   Assessment and Plan:   1. Abnormal EKG: Initial EKG was suggestive of anterior ST elevation myocardial infarction.  However, EKG changes improved significantly after he was treated for presumed hyperkalemia which is the likely culprit.  The patient did have similar presentation in October 2020 and at that time cardiac catheterization was performed and showed minimal nonobstructive coronary artery disease with normal ejection fraction.  I suspect that his EKG changes are due to underlying metabolic abnormalities, hyperkalemia and severe acidosis.  An echocardiogram is reasonable.  No plans for repeat cardiac catheterization. 2. Diabetes mellitus with DKA: His blood sugar is greater than 600 and he is likely in DKA.  He will need insulin drip.  I suspect he is in acute renal failure as well. 3. Polysubstance abuse: Check urine drug screen. 4. The patient appears critically ill with evidence of multiorgan failure and overall poor prognosis.  I will sign off.  Please call us of additional cardiac assistance is needed.  For questions or updates, please contact [facility] Please consult [email or web address] for contact info under     Signed,  , MD  02/13/2020 4:11 PM  

## 2020-02-13 NOTE — ED Notes (Signed)
160mg  rectal aspirin given by kelly,rn verbal order md isaacs

## 2020-02-13 NOTE — ED Notes (Signed)
Bicarb administered at this time verbal order md isaacs

## 2020-02-13 NOTE — Consult Note (Signed)
Pharmacy Antibiotic Note  Spencer [name] is a 59 y.o. male admitted on 02/13/2020 with sepsis.  Pharmacy has been consulted for Vancomcyin/Cefepime dosing.   Cefepime 2g x1 and Vancomycin 1g x1 @ ~ 1700 today.   Plan: 1. Will order Vancomycin 500 mg x1 dose for a total loading dose of 1500 mg. Will follow Scr with AM labs. Will dose vancomycin based on levels pending Scr with AM labs.   2. Cefepime 2g Q24H. Will recheck Scr with AM labs.    Height: 5\' 10"  (177.8 cm) Weight: 61.6 kg (135 lb 12.9 oz) IBW/kg (Calculated) : 73  Temp (24hrs), Avg:96.6 F (35.9 C), Min:95.3 F (35.2 C), Max:98.1 F (36.7 C)  Recent Labs  Lab 02/13/20 1535 02/13/20 1602 02/13/20 1729  WBC 21.4*  --   --   CREATININE  --  4.71*  --   LATICACIDVEN 5.6*  --  2.7*    Estimated Creatinine Clearance: 14.4 mL/min (A) (by C-G formula based on SCr of 4.71 mg/dL (H)).    No Known Allergies   Thank you for allowing pharmacy to be a part of this patient's care.  04/14/20 02/13/2020 8:02 PM

## 2020-02-13 NOTE — Progress Notes (Signed)
Pharmacy Antibiotic Note  Spencer [name] is a 59 y.o. male admitted on 02/13/2020 with DKA and abnormal EKG. Patient has past medical history significant for polysubstance abuse, EKG abnormalities,  Brain aneurysm GERD, and hypertension. Patient was found by roommate vomiting over 10 hours. Pharmacy has been consulted for Unasyn dosing.  Plan: Unasyn 3g IV Q12hr. Will follow-up renal function in am and adjust accordingly.   Height: 5\' 10"  (177.8 cm) Weight: 61.6 kg (135 lb 12.9 oz) IBW/kg (Calculated) : 73  Temp (24hrs), Avg:96.6 F (35.9 C), Min:95.3 F (35.2 C), Max:98.1 F (36.7 C)  Recent Labs  Lab 02/13/20 1535 02/13/20 1602 02/13/20 1729  WBC 21.4*  --   --   CREATININE  --  4.71* 4.61*  LATICACIDVEN 5.6*  --  2.7*    Estimated Creatinine Clearance: 14.7 mL/min (A) (by C-G formula based on SCr of 4.61 mg/dL (H)).    No Known Allergies  Antimicrobials this admission: Cefepime 4/9  Vancomycin 4/9 Unasyn 4/9 >>   Dose adjustments this admission: N/A  Microbiology results: 4/9 BCx: pending 4/9 MRSA PCR: negative  4/9 Coronavirus: negative  4/9 Influenza A/B: negative   Thank you for allowing pharmacy to be a part of this patient's care.  Markie Heffernan L 02/13/2020 9:40 PM

## 2020-02-13 NOTE — ED Notes (Signed)
MD Isaacs at bedside preparing to intubate. Resp at bedside. 7.5 ET tube in place at 1553. Positive color change.

## 2020-02-13 NOTE — ED Notes (Signed)
Pt wallet and clothing sent with sister.

## 2020-02-13 NOTE — ED Notes (Signed)
150mg  amiodarone bolus given by dee,rn verbal order md isaacs

## 2020-02-13 NOTE — ED Notes (Signed)
60 mg rocc administered by dee,rn verbal order md isaacs

## 2020-02-14 ENCOUNTER — Inpatient Hospital Stay: Payer: Medicaid Other

## 2020-02-14 LAB — BASIC METABOLIC PANEL
Anion gap: 13 (ref 5–15)
Anion gap: 10 (ref 5–15)
Anion gap: 7 (ref 5–15)
Anion gap: 6 (ref 5–15)
BUN: 80 mg/dL — ABNORMAL HIGH (ref 8–23)
BUN: 78 mg/dL — ABNORMAL HIGH (ref 8–23)
BUN: 75 mg/dL — ABNORMAL HIGH (ref 8–23)
BUN: 70 mg/dL — ABNORMAL HIGH (ref 8–23)
CO2: 24 mmol/L (ref 22–32)
CO2: 28 mmol/L (ref 22–32)
CO2: 28 mmol/L (ref 22–32)
CO2: 28 mmol/L (ref 22–32)
Calcium: 8.4 mg/dL — ABNORMAL LOW (ref 8.9–10.3)
Calcium: 8.5 mg/dL — ABNORMAL LOW (ref 8.9–10.3)
Calcium: 8.1 mg/dL — ABNORMAL LOW (ref 8.9–10.3)
Calcium: 8 mg/dL — ABNORMAL LOW (ref 8.9–10.3)
Chloride: 110 mmol/L (ref 98–111)
Chloride: 112 mmol/L — ABNORMAL HIGH (ref 98–111)
Chloride: 111 mmol/L (ref 98–111)
Chloride: 113 mmol/L — ABNORMAL HIGH (ref 98–111)
Creatinine, Ser: 3.39 mg/dL — ABNORMAL HIGH (ref 0.61–1.24)
Creatinine, Ser: 3.08 mg/dL — ABNORMAL HIGH (ref 0.61–1.24)
Creatinine, Ser: 2.65 mg/dL — ABNORMAL HIGH (ref 0.61–1.24)
Creatinine, Ser: 2.32 mg/dL — ABNORMAL HIGH (ref 0.61–1.24)
GFR calc Af Amer: 21 mL/min — ABNORMAL LOW (ref >60)
GFR calc Af Amer: 24 mL/min — ABNORMAL LOW (ref >60)
GFR calc Af Amer: 29 mL/min — ABNORMAL LOW (ref >60)
GFR calc Af Amer: 34 mL/min — ABNORMAL LOW (ref >60)
GFR calc non Af Amer: 18 mL/min — ABNORMAL LOW (ref >60)
GFR calc non Af Amer: 21 mL/min — ABNORMAL LOW (ref >60)
GFR calc non Af Amer: 25 mL/min — ABNORMAL LOW (ref >60)
GFR calc non Af Amer: 29 mL/min — ABNORMAL LOW (ref >60)
Glucose, Bld: 538 mg/dL (ref 70–99)
Glucose, Bld: 279 mg/dL — ABNORMAL HIGH (ref 70–99)
Glucose, Bld: 164 mg/dL — ABNORMAL HIGH (ref 70–99)
Glucose, Bld: 124 mg/dL — ABNORMAL HIGH (ref 70–99)
Potassium: 3.2 mmol/L — ABNORMAL LOW (ref 3.5–5.1)
Potassium: 3.3 mmol/L — ABNORMAL LOW (ref 3.5–5.1)
Potassium: 3.6 mmol/L (ref 3.5–5.1)
Potassium: 3.4 mmol/L — ABNORMAL LOW (ref 3.5–5.1)
Sodium: 147 mmol/L — ABNORMAL HIGH (ref 135–145)
Sodium: 150 mmol/L — ABNORMAL HIGH (ref 135–145)
Sodium: 146 mmol/L — ABNORMAL HIGH (ref 135–145)
Sodium: 147 mmol/L — ABNORMAL HIGH (ref 135–145)

## 2020-02-14 LAB — BLOOD GAS, ARTERIAL
Acid-Base Excess: 11 mmol/L — ABNORMAL HIGH (ref 0.0–2.0)
Bicarbonate: 34 mmol/L — ABNORMAL HIGH (ref 20.0–28.0)
FIO2: 0.36
MECHVT: 550 mL
O2 Saturation: 99.3 %
PEEP: 5 cmH2O
Patient temperature: 37
RATE: 25 resp/min
pCO2 arterial: 38 mmHg (ref 32.0–48.0)
pH, Arterial: 7.56 — ABNORMAL HIGH (ref 7.350–7.450)
pO2, Arterial: 132 mmHg — ABNORMAL HIGH (ref 83.0–108.0)

## 2020-02-14 LAB — LIPID PANEL
Cholesterol: 137 mg/dL (ref 0–200)
HDL: 33 mg/dL — ABNORMAL LOW (ref >40)
LDL Cholesterol: 92 mg/dL (ref 0–99)
Total CHOL/HDL Ratio: 4.2 RATIO
Triglycerides: 61 mg/dL (ref <150)
VLDL: 12 mg/dL (ref 0–40)

## 2020-02-14 LAB — HEPARIN LEVEL (UNFRACTIONATED)
Heparin Unfractionated: 0.77 IU/mL — ABNORMAL HIGH (ref 0.30–0.70)
Heparin Unfractionated: 0.55 IU/mL (ref 0.30–0.70)
Heparin Unfractionated: 0.43 IU/mL (ref 0.30–0.70)

## 2020-02-14 LAB — CBC
HCT: 35.2 % — ABNORMAL LOW (ref 39.0–52.0)
Hemoglobin: 12.9 g/dL — ABNORMAL LOW (ref 13.0–17.0)
MCH: 31.5 pg (ref 26.0–34.0)
MCHC: 36.6 g/dL — ABNORMAL HIGH (ref 30.0–36.0)
MCV: 86.1 fL (ref 80.0–100.0)
Platelets: 219 10*3/uL (ref 150–400)
RBC: 4.09 MIL/uL — ABNORMAL LOW (ref 4.22–5.81)
RDW: 12.3 % (ref 11.5–15.5)
WBC: 10.3 10*3/uL (ref 4.0–10.5)
nRBC: 0 % (ref 0.0–0.2)

## 2020-02-14 LAB — GLUCOSE, CAPILLARY
Glucose-Capillary: 100 mg/dL — ABNORMAL HIGH (ref 70–99)
Glucose-Capillary: 106 mg/dL — ABNORMAL HIGH (ref 70–99)
Glucose-Capillary: 122 mg/dL — ABNORMAL HIGH (ref 70–99)
Glucose-Capillary: 128 mg/dL — ABNORMAL HIGH (ref 70–99)
Glucose-Capillary: 138 mg/dL — ABNORMAL HIGH (ref 70–99)
Glucose-Capillary: 145 mg/dL — ABNORMAL HIGH (ref 70–99)
Glucose-Capillary: 159 mg/dL — ABNORMAL HIGH (ref 70–99)
Glucose-Capillary: 169 mg/dL — ABNORMAL HIGH (ref 70–99)
Glucose-Capillary: 181 mg/dL — ABNORMAL HIGH (ref 70–99)
Glucose-Capillary: 183 mg/dL — ABNORMAL HIGH (ref 70–99)
Glucose-Capillary: 244 mg/dL — ABNORMAL HIGH (ref 70–99)
Glucose-Capillary: 344 mg/dL — ABNORMAL HIGH (ref 70–99)
Glucose-Capillary: 375 mg/dL — ABNORMAL HIGH (ref 70–99)
Glucose-Capillary: 384 mg/dL — ABNORMAL HIGH (ref 70–99)
Glucose-Capillary: 48 mg/dL — ABNORMAL LOW (ref 70–99)
Glucose-Capillary: 507 mg/dL (ref 70–99)
Glucose-Capillary: 544 mg/dL (ref 70–99)
Glucose-Capillary: 59 mg/dL — ABNORMAL LOW (ref 70–99)

## 2020-02-14 LAB — PHOSPHORUS: Phosphorus: 2.1 mg/dL — ABNORMAL LOW (ref 2.5–4.6)

## 2020-02-14 LAB — PROCALCITONIN: Procalcitonin: 4.3 ng/mL

## 2020-02-14 LAB — MAGNESIUM: Magnesium: 2.5 mg/dL — ABNORMAL HIGH (ref 1.7–2.4)

## 2020-02-14 LAB — LACTIC ACID, PLASMA: Lactic Acid, Venous: 1.3 mmol/L (ref 0.5–1.9)

## 2020-02-14 MED ORDER — POTASSIUM & SODIUM PHOSPHATES 280-160-250 MG PO PACK
2.0000 | PACK | ORAL | Status: AC
  Administered 2020-02-14 (×2): 2
  Filled 2020-02-14 (×2): qty 2

## 2020-02-14 MED ORDER — DEXTROSE 50 % IV SOLN
INTRAVENOUS | Status: AC
  Administered 2020-02-14: 50 mL via INTRAVENOUS
  Filled 2020-02-14: qty 50

## 2020-02-14 MED ORDER — INSULIN DETEMIR 100 UNIT/ML ~~LOC~~ SOLN
8.0000 [IU] | Freq: Two times a day (BID) | SUBCUTANEOUS | Status: DC
  Administered 2020-02-14 – 2020-02-15 (×3): 8 [IU] via SUBCUTANEOUS
  Filled 2020-02-14 (×5): qty 0.08

## 2020-02-14 MED ORDER — DEXTROSE 50 % IV SOLN: 25.0000 g | Freq: Once | INTRAVENOUS | Status: AC

## 2020-02-14 MED ORDER — CHLORHEXIDINE GLUCONATE CLOTH 2 % EX PADS
6.0000 | MEDICATED_PAD | Freq: Every day | CUTANEOUS | Status: DC
  Administered 2020-02-14 – 2020-02-17 (×3): 6 via TOPICAL

## 2020-02-14 MED ORDER — FREE WATER
200.0000 mL | Status: DC
  Administered 2020-02-14 – 2020-02-15 (×6): 200 mL

## 2020-02-14 MED ORDER — DEXTROSE 50 % IV SOLN: 1.0000 | Freq: Once | INTRAVENOUS | Status: AC

## 2020-02-14 MED ORDER — FENTANYL CITRATE (PF) 100 MCG/2ML IJ SOLN: 50.0000 ug | INTRAMUSCULAR | Status: DC | PRN

## 2020-02-14 MED ORDER — NOREPINEPHRINE 4 MG/250ML-% IV SOLN
2.0000 ug/min | INTRAVENOUS | Status: DC
  Administered 2020-02-14: 3 ug/min via INTRAVENOUS
  Filled 2020-02-14: qty 250

## 2020-02-14 MED ORDER — INSULIN ASPART 100 UNIT/ML ~~LOC~~ SOLN
1.0000 [IU] | SUBCUTANEOUS | Status: DC
  Administered 2020-02-15: 13:00:00 1 [IU] via SUBCUTANEOUS
  Filled 2020-02-14 (×2): qty 1

## 2020-02-14 MED ORDER — LACTATED RINGERS IV SOLN: INTRAVENOUS | Status: DC

## 2020-02-14 MED ORDER — DEXTROSE 50 % IV SOLN
INTRAVENOUS | Status: AC
  Administered 2020-02-14: 17:00:00 50 mL via INTRAVENOUS
  Filled 2020-02-14: qty 50

## 2020-02-14 MED ORDER — POTASSIUM & SODIUM PHOSPHATES 280-160-250 MG PO PACK
2.0000 | PACK | ORAL | Status: DC
  Filled 2020-02-14 (×3): qty 2

## 2020-02-14 MED ORDER — DEXTROSE-NACL 5-0.45 % IV SOLN: INTRAVENOUS | Status: DC

## 2020-02-14 MED ORDER — SODIUM CHLORIDE 0.9 % IV BOLUS
1000.0000 mL | Freq: Once | INTRAVENOUS | Status: AC
  Administered 2020-02-14: 1000 mL via INTRAVENOUS

## 2020-02-14 MED ORDER — POTASSIUM CHLORIDE 10 MEQ/100ML IV SOLN
10.0000 meq | INTRAVENOUS | Status: AC
  Administered 2020-02-14 (×5): 10 meq via INTRAVENOUS
  Filled 2020-02-14 (×5): qty 100

## 2020-02-14 NOTE — Progress Notes (Signed)
[place], [place] 02/14/20  Subjective:   Hospital day # 1 Patient presented to the emergency room on April 9 via EMS for cord to standing.  Patient was found by his roommate the previous night throwing up for 10 hours per report.  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  Patient was diagnosed with DKA with glucose of 1400, CO2 less than 7 and potassium of 7.4.  He was admitted for further evaluation and management. With IV hydration has lab parameters have improved.  Baseline creatinine of 1.10.  Admission creatinine of 4.71 which has improved to 3.08 today Potassium has now decreased to 3.3.  Sodium is high at 150.  Cvs: no pressors Pulm: vent assisted; Fio2 30% Gi: NGT to suction Renal: 04/09 0701 - 04/10 0700 In: 4300.3 [I.V.:2075.3; IV Piggyback:2225] Out: 7026 [Urine:1165; Emesis/NG output:350] Lab Results  Component Value Date   CREATININE 3.08 (H) 02/14/2020   CREATININE 3.39 (H) 02/14/2020   CREATININE 4.61 (H) 02/13/2020     Objective:  Vital signs in last 24 hours:  Temp:  [95.3 F (35.2 C)-99.5 F (37.5 C)] 99.1 F (37.3 C) (04/10 0800) Pulse Rate:  [71-175] 71 (04/10 0800) Resp:  [22-27] 25 (04/10 0800) BP: (71-125)/(52-85) 120/85 (04/10 0800) SpO2:  [94 %-100 %] 100 % (04/10 0800) FiO2 (%):  [30 %-100 %] 30 % (04/10 0805) Weight:  [61.5 kg-66.5 kg] 61.5 kg (04/10 0307)  Weight change:  Filed Weights   02/13/20 1539 02/13/20 1855 02/14/20 0307  Weight: 66.5 kg 61.6 kg 61.5 kg    Intake/Output:    Intake/Output Summary (Last 24 hours) at 02/14/2020 0925 Last data filed at 02/14/2020 0809 Gross per 24 hour  Intake 5482.74 ml  Output 1715 ml  Net 3767.74 ml     Physical Exam: General: Critically ill appearing  HEENT Anicteric, ETT  Pulm/lungs Coarse, vent assisted  CVS/Heart Regular, no rub  Abdomen:  Soft, non distended  Extremities: No edema  Neurologic: Sedated, not following commands  Skin: No  rashes          Basic Metabolic Panel:  Recent Labs  Lab 02/13/20 1602 02/13/20 1602 02/13/20 1729 02/14/20 0120 02/14/20 0500  NA 132*  --  132* 147* 150*  K 7.4*  --  7.4* 3.2* 3.3*  CL 93*  --  95* 110 112*  CO2 <7*  --  <7* 24 28  GLUCOSE 1,424*  --  1,291* 538* 279*  BUN 90*  --  92* 80* 78*  CREATININE 4.71*  --  4.61* 3.39* 3.08*  CALCIUM 9.4   < > 9.1 8.4* 8.5*  MG 3.6*  --   --   --  2.5*  PHOS 13.3*  --   --   --  2.1*   < > = values in this interval not displayed.     CBC: Recent Labs  Lab 02/13/20 1535 02/14/20 0500  WBC 21.4* 10.3  NEUTROABS 17.9*  --   HGB 14.1 12.9*  HCT 52.8* 35.2*  MCV 118.4* 86.1  PLT 309 219     No results found for: HEPBSAG, HEPBSAB, HEPBIGM    Microbiology:  Recent Results (from the past 240 hour(s))  Blood culture (routine x 2)     Status: None (Preliminary result)   Collection Time: 02/13/20  4:04 PM   Specimen: BLOOD  Result Value Ref Range Status   Specimen Description BLOOD LEFT ANTECUBITAL  Final   Special Requests   Final    BOTTLES DRAWN  AEROBIC AND ANAEROBIC Blood Culture adequate volume   Culture   Final    NO GROWTH < 24 HOURS Performed at [facility], [address] [place], [place] [address]    Report Status PENDING  Incomplete  Blood culture (routine x 2)     Status: None (Preliminary result)   Collection Time: 02/13/20  4:09 PM   Specimen: BLOOD  Result Value Ref Range Status   Specimen Description BLOOD RIGHT ANTECUBITAL  Final   Special Requests   Final    BOTTLES DRAWN AEROBIC AND ANAEROBIC Blood Culture adequate volume   Culture  Setup Time PENDING  Incomplete   Culture   Final    NO GROWTH < 24 HOURS Performed at [facility], [address] [place], [place] [address]    Report Status PENDING  Incomplete  Respiratory Panel by RT PCR (Flu A&B, Covid) - Nasopharyngeal Swab     Status: None   Collection Time: 02/13/20  5:29 PM   Specimen: Nasopharyngeal Swab   Result Value Ref Range Status   SARS Coronavirus 2 by RT PCR NEGATIVE NEGATIVE Final    Comment: (NOTE) SARS-CoV-2 target nucleic acids are NOT DETECTED. The SARS-CoV-2 RNA is generally detectable in upper respiratoy specimens during the acute phase of infection. The lowest concentration of SARS-CoV-2 viral copies this assay can detect is 131 copies/mL. A negative result does not preclude SARS-Cov-2 infection and should not be used as the sole basis for treatment or other patient management decisions. A negative result may occur with  improper specimen collection/handling, submission of specimen other than nasopharyngeal swab, presence of viral mutation(s) within the areas targeted by this assay, and inadequate number of viral copies (<131 copies/mL). A negative result must be combined with clinical observations, patient history, and epidemiological information. The expected result is Negative. Fact Sheet for Patients:  PinkCheek.be Fact Sheet for Healthcare Providers:  GravelBags.it This test is not yet ap proved or cleared by the [place] FDA and  has been authorized for detection and/or diagnosis of SARS-CoV-2 by FDA under an Emergency Use Authorization (EUA). This EUA will remain  in effect (meaning this test can be used) for the duration of the COVID-19 declaration under Section 564(b)(1) of the Act, 21 U.S.C. section 360bbb-3(b)(1), unless the authorization is terminated or revoked sooner.    Influenza A by PCR NEGATIVE NEGATIVE Final   Influenza B by PCR NEGATIVE NEGATIVE Final    Comment: (NOTE) The Xpert Xpress SARS-CoV-2/FLU/RSV assay is intended as an aid in  the diagnosis of influenza from Nasopharyngeal swab specimens and  should not be used as a sole basis for treatment. Nasal washings and  aspirates are unacceptable for Xpert Xpress SARS-CoV-2/FLU/RSV  testing. Fact Sheet for  Patients: PinkCheek.be Fact Sheet for Healthcare Providers: GravelBags.it This test is not yet approved or cleared by the [place] FDA and  has been authorized for detection and/or diagnosis of SARS-CoV-2 by  FDA under an Emergency Use Authorization (EUA). This EUA will remain  in effect (meaning this test can be used) for the duration of the  Covid-19 declaration under Section 564(b)(1) of the Act, 21  U.S.C. section 360bbb-3(b)(1), unless the authorization is  terminated or revoked. Performed at [facility], [address] [place], [place] [address]   MRSA PCR Screening     Status: None   Collection Time: 02/13/20  6:52 PM   Specimen: Nasopharyngeal  Result Value Ref Range Status   MRSA by PCR NEGATIVE NEGATIVE  Final    Comment:        The GeneXpert MRSA Assay (FDA approved for NASAL specimens only), is one component of a comprehensive MRSA colonization surveillance program. It is not intended to diagnose MRSA infection nor to guide or monitor treatment for MRSA infections. Performed at [facility], [address] [place], [place] [address]     Coagulation Studies: Recent Labs    02/13/20 1535  LABPROT 12.2  INR 0.9    Urinalysis: Recent Labs    02/13/20 1535  COLORURINE YELLOW*  LABSPEC 1.020  PHURINE 5.0  GLUCOSEU >=500*  HGBUR SMALL*  BILIRUBINUR NEGATIVE  KETONESUR 20*  PROTEINUR 30*  NITRITE NEGATIVE  LEUKOCYTESUR NEGATIVE      Imaging: DG Chest 1 View  Result Date: 02/13/2020 CLINICAL DATA:  Status post intubation. EXAM: CHEST  1 VIEW COMPARISON:  December 15, 2019. FINDINGS: The heart size and mediastinal contours are within normal limits. Endotracheal tube is in grossly good position. Nasogastric tube is seen entering right mainstem bronchus; withdrawal is recommended. No pneumothorax or pleural effusion is noted. Lungs are clear. The visualized skeletal  structures are unremarkable. IMPRESSION: Endotracheal tube in grossly good position. Nasogastric tube is seen entering right mainstem bronchus; withdrawal is recommended. Critical Value/emergent results were called by telephone at the time of interpretation on 02/13/2020 at 4:22 pm to provider Duffy Bruce , who verbally acknowledged these results. Electronically Signed   By: Marijo Conception M.D.   On: 02/13/2020 16:22   DG Abdomen 1 View  Result Date: 02/13/2020 CLINICAL DATA:  NG tube EXAM: ABDOMEN - 1 VIEW COMPARISON:  07/27/2019 FINDINGS: NG tube is in the stomach.  Nonobstructive bowel gas pattern. IMPRESSION: NG tube in the stomach. Electronically Signed   By: Rolm Baptise M.D.   On: 02/13/2020 17:11   CT Head Wo Contrast  Result Date: 02/13/2020 CLINICAL DATA:  59 year old male with encephalopathy. EXAM: CT HEAD WITHOUT CONTRAST TECHNIQUE: Contiguous axial images were obtained from the base of the skull through the vertex without intravenous contrast. COMPARISON:  Head CT dated 08/19/2019. FINDINGS: Brain: The ventricles and sulci appropriate size for patient's age. Probable tiny old lacunar infarct in the left lentiform nucleus. The gray-white matter discrimination is preserved. There is no acute intracranial hemorrhage. No mass effect or midline shift. No extra-axial fluid collection. Vascular: No hyperdense vessel or unexpected calcification. Skull: No acute calvarial pathology. Left frontal and parietal burr holes. Sinuses/Orbits: The visualized paranasal sinuses and mastoid air cells are clear. Cerumen noted in the right external auditory canal. Other: None IMPRESSION: No acute intracranial pathology. Electronically Signed   By: Anner Crete M.D.   On: 02/13/2020 16:44   DG Chest Port 1 View  Result Date: 02/14/2020 CLINICAL DATA:  NG tube EXAM: PORTABLE CHEST 1 VIEW COMPARISON:  Radiograph 02/13/2020 FINDINGS: Stable cardiac silhouette. Endotracheal tube unchanged. Introduction of NG tube  with side port below the GE junction. Lungs are clear.  No pneumothorax.  Anterior cervical fusion noted. IMPRESSION: 1. NG tube with side port below the GE junction in good position. 2. Lungs are clear. Electronically Signed   By: Suzy Bouchard M.D.   On: 02/14/2020 08:37     Medications:   . sodium chloride Stopped (02/14/20 0031)  . sodium chloride    . ampicillin-sulbactam (UNASYN) IV 3 g (02/13/20 2306)  . dextrose 5 % and 0.45% NaCl 125 mL/hr at 02/14/20 0809  . famotidine (PEPCID) IV 20 mg (02/13/20 2218)  . fentaNYL infusion  INTRAVENOUS 275 mcg/hr (02/14/20 0809)  . heparin 600 Units/hr (02/14/20 0809)  . insulin 3.6 mL/hr at 02/14/20 0809  . lactated ringers    . norepinephrine (LEVOPHED) Adult infusion 2 mcg/min (02/14/20 0809)   . albuterol  5 mg Nebulization Once  . aspirin  81 mg Per Tube Daily  . atorvastatin  40 mg Oral q1800  . chlorhexidine gluconate (MEDLINE KIT)  15 mL Mouth Rinse BID  . Chlorhexidine Gluconate Cloth  6 each Topical Daily  . free water  200 mL Per Tube Q4H  . mouth rinse  15 mL Mouth Rinse 10 times per day  . sodium chloride flush  3 mL Intravenous Q12H   sodium chloride, acetaminophen, dextrose, docusate sodium, midazolam, midazolam, ondansetron (ZOFRAN) IV, polyethylene glycol, sodium chloride flush  Assessment/ Plan:  59 y.o. male with diabetes, polysubstance abuse, cocaine, amphetamine, admitted on 02/13/2020 for Respiratory failure ([place]) [J96.90] Hyperkalemia [E87.5] AKI (acute kidney injury) ([place]) [N17.9] Diabetic ketoacidosis with coma associated with type 1 diabetes mellitus ([place]) [E10.11]   #Acute kidney injury with severe hyperkalemia Likely secondary to ATN from hemodynamic instability Patient is non-oliguric s Creatinine is improving Electrolytes and Volume status are acceptable No acute indication for Dialysis at present  #Hypernatremia Currently on d5 1/2 NS Monitor closely May need to change fluids to D5 if Na remains  high  #Diabetic ketoacidosis Management as per icu Improving  #Polysubstance abuse, urine drug screen positive for cocaine #STEMI #Acute respiratory failure Vent assisted         LOS: 1 Trung Wenzl 4/10/20219:25 AM  [facility] [place], [place]  Note: This note was prepared with Dragon dictation. Any transcription errors are unintentional

## 2020-02-14 NOTE — Consult Note (Signed)
ANTICOAGULATION CONSULT NOTE   Pharmacy Consult for Heparin  Indication: ACS / STEMI  No Known Allergies  Patient Measurements: Height: 5\' 10"  (177.8 cm) Weight: 61.5 kg (135 lb 9.3 oz) IBW/kg (Calculated) : 73 Heparin Dosing Weight: 65.5 kg  Vital Signs: Temp: 99.1 F (37.3 C) (04/10 0800) Temp Source: Bladder (04/10 0800) BP: 120/85 (04/10 0800) Pulse Rate: 71 (04/10 0800)  Labs: Recent Labs    02/13/20 1535 02/13/20 1602 02/13/20 1602 02/13/20 1729 02/13/20 1729 02/14/20 0120 02/14/20 0500 02/14/20 0857  HGB 14.1  --   --   --   --   --  12.9*  --   HCT 52.8*  --   --   --   --   --  35.2*  --   PLT 309  --   --   --   --   --  219  --   APTT 29  --   --   --   --   --   --   --   LABPROT 12.2  --   --   --   --   --   --   --   INR 0.9  --   --   --   --   --   --   --   HEPARINUNFRC  --   --   --   --   --  0.77*  --  0.55  CREATININE  --  4.71*   < > 4.61*   < > 3.39* 3.08* 2.65*  TROPONINIHS  --  138*  --  115*  --   --   --   --    < > = values in this interval not displayed.    Estimated Creatinine Clearance: 25.5 mL/min (A) (by C-G formula based on SCr of 2.65 mg/dL (H)).   Medications:  No PTA anticoagulant -confirmed with nurse  Assessment: Pharmacy has been consulted in heparin dosing in patient with ACS. Baseline labs ordered and appropriate.    Goal of Therapy:  Heparin level 0.3-0.7 units/ml Monitor platelets by anticoagulation protocol: Yes   Plan:  4/10 0857 HL 0.55 therapeutic x 1. Continue heparin drip at 600 units/hr. HL at 1700 to confirm. CBC daily while on heparin drip.  04/15/20, PharmD 02/14/2020,10:41 AM

## 2020-02-14 NOTE — Consult Note (Addendum)
PHARMACY CONSULT NOTE   Pharmacy Consult for Electrolyte Monitoring and Replacement; glucose monitoring, and constipation  Recent Labs: Potassium (mmol/L)  Date Value  02/14/2020 3.6   Magnesium (mg/dL)  Date Value  27/14/2320 2.5 (H)   Calcium (mg/dL)  Date Value  09/41/7919 8.1 (L)   Albumin (g/dL)  Date Value  95/79/0092 4.0   Phosphorus (mg/dL)  Date Value  00/41/5930 2.1 (L)   Sodium (mmol/L)  Date Value  02/14/2020 146 (H)     Assessment: 59 year old male with known history of polysubstance abuse including cocaine and amphetamine, diabetes mellitus with recurrent presentation with DKA due to poor compliance and history of acute kidney injury in the setting of DKA. Patient's renal function has improved to the point that CRRT was not needed, initially patient was hyperkalemic s/t AKI now in hypokalemia w/ improving AKI.   Goal of Electrolytes:  Magnesium ~2, Potassium ~ 4   Plan:  Potassium improved, now to transition off insulin drip. Phos slightly low. Phos-NAK 2 packets q4h x 2 doses. Will f/u electrolytes with am labs.  Pricilla Riffle ,PharmD Clinical Pharmacist 02/14/2020 10:39 AM

## 2020-02-14 NOTE — Progress Notes (Signed)
Inpatient Diabetes Program Recommendations  AACE/ADA: New Consensus Statement on Inpatient Glycemic Control (2015)  Target Ranges:  Prepandial:   less than 140 mg/dL      Peak postprandial:   less than 180 mg/dL (1-2 hours)      Critically ill patients:  140 - 180 mg/dL   Lab Results  Component Value Date   GLUCAP 181 (H) 02/14/2020   HGBA1C 12.2 (H) 11/26/2019    Review of Glycemic Control  Diabetes history: DM 2 Outpatient Diabetes medications: Lantus 22 units Daily, Novolog 0-30 units tid Current orders for Inpatient glycemic control:  IV insulin/Endotool DKA  Inpatient Diabetes Program Recommendations:    Noted consult for severe DKA. Will follow pt closely  BMET clearing acidosis. May use "Hyperglycemic crisis-transition off IV insulin" order set for transition when appropriate using 0.3 units/kg measurement for basal insulin calculation (approx 18 units).  Thanks,  Christena Deem RN, MSN, BC-ADM Inpatient Diabetes Coordinator Team Pager [phone] (8a-5p)

## 2020-02-14 NOTE — Consult Note (Signed)
PHARMACY CONSULT NOTE   Pharmacy Consult for Electrolyte Monitoring and Replacement; glucose monitoring, and constipation  Recent Labs: Potassium (mmol/L)  Date Value  02/14/2020 3.2 (L)   Magnesium (mg/dL)  Date Value  32/99/2426 3.6 (H)   Calcium (mg/dL)  Date Value  83/41/9622 8.4 (L)   Albumin (g/dL)  Date Value  29/79/8921 4.0   Phosphorus (mg/dL)  Date Value  19/41/7408 13.3 (H)   Sodium (mmol/L)  Date Value  02/14/2020 147 (H)     Assessment: 59 year old male with known history of polysubstance abuse including cocaine and amphetamine, diabetes mellitus with recurrent presentation with DKA due to poor compliance and history of acute kidney injury in the setting of DKA. Patient's renal function has improved to the point that CRRT was not needed, initially patient was hyperkalemic s/t AKI now in hypokalemia w/ improving AKI.   Electrolytes: K 7.4 Scr 4.71 (baseline 1.10) Na 132 Mg pending  Phosphorus pending   Goal of Electrolytes:  Magnesium ~2, Potassium ~ 4   Glucose: -Insulin regular continuous infusion running @ 7.5 units/hr -BG coming down now at 384   Constipation  - Miralax PRN; docusate 100 mg bid prn - LBM unknown, admitted 4/9 - Pharmacy will continue to monitor  Plan:  04/10 @ 0130 K 3.2 will replace w/ KCI 10 mEq IV x 5 and will monitor K w/ q4h labs. Phos, mag w/ am labs and continue to monitor.   Thomasene Ripple ,PharmD Clinical Pharmacist 02/14/2020 4:00 AM

## 2020-02-14 NOTE — Consult Note (Signed)
ANTICOAGULATION CONSULT NOTE   Pharmacy Consult for Heparin  Indication: ACS / STEMI  No Known Allergies  Patient Measurements: Height: 5\' 10"  (177.8 cm) Weight: 61.5 kg (135 lb 9.3 oz) IBW/kg (Calculated) : 73 Heparin Dosing Weight: 65.5 kg  Vital Signs: Temp: 98.1 F (36.7 C) (04/10 2000) Temp Source: Bladder (04/10 1900) BP: 131/83 (04/10 2000) Pulse Rate: 72 (04/10 2000)  Labs: Recent Labs    02/13/20 1535 02/13/20 1602 02/13/20 1602 02/13/20 1729 02/13/20 1729 02/14/20 0120 02/14/20 0120 02/14/20 0500 02/14/20 0857 02/14/20 1333 02/14/20 1904  HGB 14.1  --   --   --   --   --   --  12.9*  --   --   --   HCT 52.8*  --   --   --   --   --   --  35.2*  --   --   --   PLT 309  --   --   --   --   --   --  219  --   --   --   APTT 29  --   --   --   --   --   --   --   --   --   --   LABPROT 12.2  --   --   --   --   --   --   --   --   --   --   INR 0.9  --   --   --   --   --   --   --   --   --   --   HEPARINUNFRC  --   --   --   --   --  0.77*  --   --  0.55  --  0.43  CREATININE  --  4.71*   < > 4.61*   < > 3.39*   < > 3.08* 2.65* 2.32*  --   TROPONINIHS  --  138*  --  115*  --   --   --   --   --   --   --    < > = values in this interval not displayed.    Estimated Creatinine Clearance: 29.1 mL/min (A) (by C-G formula based on SCr of 2.32 mg/dL (H)).   Medications:  No PTA anticoagulant -confirmed with nurse  Assessment: Pharmacy has been consulted in heparin dosing in patient with ACS. Baseline labs ordered and appropriate.    Goal of Therapy:  Heparin level 0.3-0.7 units/ml Monitor platelets by anticoagulation protocol: Yes   Plan:  4/10 @1954  HL 0.43 therapeutic x 2. Continue heparin drip at 600 units/hr. HL with morning labs. CBC daily while on heparin drip.  04/15/20, PharmD 02/14/2020,8:12 PM

## 2020-02-15 ENCOUNTER — Inpatient Hospital Stay (HOSPITAL_COMMUNITY)
Admit: 2020-02-15 | Discharge: 2020-02-15 | Disposition: A | Discharge status: Home / Self Care | Payer: Medicaid Other | Attending: Pulmonary Disease | Admitting: Pulmonary Disease

## 2020-02-15 DIAGNOSIS — J9601 Acute respiratory failure with hypoxia: Secondary | ICD-10-CM

## 2020-02-15 DIAGNOSIS — R9431 Abnormal electrocardiogram [ECG] [EKG]: Secondary | ICD-10-CM

## 2020-02-15 DIAGNOSIS — F1410 Cocaine abuse, uncomplicated: Secondary | ICD-10-CM

## 2020-02-15 LAB — GLUCOSE, CAPILLARY
Glucose-Capillary: 118 mg/dL — ABNORMAL HIGH (ref 70–99)
Glucose-Capillary: 127 mg/dL — ABNORMAL HIGH (ref 70–99)
Glucose-Capillary: 149 mg/dL — ABNORMAL HIGH (ref 70–99)
Glucose-Capillary: 25 mg/dL — CL (ref 70–99)
Glucose-Capillary: 367 mg/dL — ABNORMAL HIGH (ref 70–99)
Glucose-Capillary: 377 mg/dL — ABNORMAL HIGH (ref 70–99)
Glucose-Capillary: 377 mg/dL — ABNORMAL HIGH (ref 70–99)
Glucose-Capillary: 91 mg/dL (ref 70–99)

## 2020-02-15 LAB — PROCALCITONIN: Procalcitonin: 1.1 ng/mL

## 2020-02-15 LAB — BASIC METABOLIC PANEL
Anion gap: 8 (ref 5–15)
Anion gap: 10 (ref 5–15)
BUN: 46 mg/dL — ABNORMAL HIGH (ref 8–23)
BUN: 30 mg/dL — ABNORMAL HIGH (ref 8–23)
CO2: 29 mmol/L (ref 22–32)
CO2: 27 mmol/L (ref 22–32)
Calcium: 8.5 mg/dL — ABNORMAL LOW (ref 8.9–10.3)
Calcium: 8.7 mg/dL — ABNORMAL LOW (ref 8.9–10.3)
Chloride: 114 mmol/L — ABNORMAL HIGH (ref 98–111)
Chloride: 103 mmol/L (ref 98–111)
Creatinine, Ser: 1.39 mg/dL — ABNORMAL HIGH (ref 0.61–1.24)
Creatinine, Ser: 1.29 mg/dL — ABNORMAL HIGH (ref 0.61–1.24)
GFR calc Af Amer: >60 mL/min (ref >60)
GFR calc Af Amer: >60 mL/min (ref >60)
GFR calc non Af Amer: 54 mL/min — ABNORMAL LOW (ref >60)
GFR calc non Af Amer: 59 mL/min — ABNORMAL LOW (ref >60)
Glucose, Bld: 136 mg/dL — ABNORMAL HIGH (ref 70–99)
Glucose, Bld: 383 mg/dL — ABNORMAL HIGH (ref 70–99)
Potassium: 3.4 mmol/L — ABNORMAL LOW (ref 3.5–5.1)
Potassium: 3.7 mmol/L (ref 3.5–5.1)
Sodium: 151 mmol/L — ABNORMAL HIGH (ref 135–145)
Sodium: 140 mmol/L (ref 135–145)

## 2020-02-15 LAB — HEPARIN LEVEL (UNFRACTIONATED)
Heparin Unfractionated: 0.1 IU/mL — ABNORMAL LOW (ref 0.30–0.70)
Heparin Unfractionated: 0.36 IU/mL (ref 0.30–0.70)
Heparin Unfractionated: 0.28 IU/mL — ABNORMAL LOW (ref 0.30–0.70)

## 2020-02-15 LAB — PHOSPHORUS: Phosphorus: 2.3 mg/dL — ABNORMAL LOW (ref 2.5–4.6)

## 2020-02-15 LAB — MAGNESIUM
Magnesium: 2.7 mg/dL — ABNORMAL HIGH (ref 1.7–2.4)
Magnesium: 2.1 mg/dL (ref 1.7–2.4)

## 2020-02-15 LAB — ECHOCARDIOGRAM COMPLETE
Height: 70 in
Weight: 2176.38 oz

## 2020-02-15 MED ORDER — LOPERAMIDE HCL 2 MG PO CAPS
2.0000 mg | ORAL_CAPSULE | ORAL | Status: DC | PRN
  Administered 2020-02-15: 22:00:00 2 mg via ORAL
  Filled 2020-02-15: qty 1

## 2020-02-15 MED ORDER — INSULIN DETEMIR 100 UNIT/ML ~~LOC~~ SOLN
10.0000 [IU] | Freq: Every day | SUBCUTANEOUS | Status: DC
  Administered 2020-02-16: 10 [IU] via SUBCUTANEOUS
  Filled 2020-02-15 (×2): qty 0.1

## 2020-02-15 MED ORDER — DEXTROSE 50 % IV SOLN: 1.0000 | Freq: Once | INTRAVENOUS | Status: AC

## 2020-02-15 MED ORDER — BACID PO TABS
2.0000 | ORAL_TABLET | Freq: Three times a day (TID) | ORAL | Status: DC
  Filled 2020-02-15 (×4): qty 2

## 2020-02-15 MED ORDER — LACTATED RINGERS IV SOLN: INTRAVENOUS | Status: DC

## 2020-02-15 MED ORDER — SODIUM CHLORIDE 0.9 % IV SOLN
3.0000 g | Freq: Four times a day (QID) | INTRAVENOUS | Status: DC
  Administered 2020-02-15 – 2020-02-17 (×8): 3 g via INTRAVENOUS
  Filled 2020-02-15: qty 8
  Filled 2020-02-15: qty 3
  Filled 2020-02-15 (×2): qty 8
  Filled 2020-02-15 (×7): qty 3
  Filled 2020-02-15: qty 8

## 2020-02-15 MED ORDER — INSULIN ASPART 100 UNIT/ML ~~LOC~~ SOLN
0.0000 [IU] | Freq: Every day | SUBCUTANEOUS | Status: DC
  Administered 2020-02-15: 21:00:00 5 [IU] via SUBCUTANEOUS
  Filled 2020-02-15: qty 1

## 2020-02-15 MED ORDER — POTASSIUM & SODIUM PHOSPHATES 280-160-250 MG PO PACK
2.0000 | PACK | ORAL | Status: AC
  Administered 2020-02-15 (×3): 2 via ORAL
  Filled 2020-02-15 (×3): qty 2

## 2020-02-15 MED ORDER — INSULIN ASPART 100 UNIT/ML ~~LOC~~ SOLN
0.0000 [IU] | Freq: Three times a day (TID) | SUBCUTANEOUS | Status: DC
  Administered 2020-02-15: 17:00:00 8 [IU] via SUBCUTANEOUS

## 2020-02-15 MED ORDER — DEXTROSE 5 % IV SOLN: INTRAVENOUS | Status: DC

## 2020-02-15 MED ORDER — DEXTROSE 50 % IV SOLN
INTRAVENOUS | Status: AC
  Administered 2020-02-15: 05:00:00 50 mL via INTRAVENOUS
  Filled 2020-02-15: qty 50

## 2020-02-15 MED ORDER — INSULIN ASPART 100 UNIT/ML ~~LOC~~ SOLN
2.0000 [IU] | Freq: Three times a day (TID) | SUBCUTANEOUS | Status: DC
  Administered 2020-02-15 – 2020-02-16 (×2): 2 [IU] via SUBCUTANEOUS
  Filled 2020-02-15: qty 1

## 2020-02-15 MED ORDER — HEPARIN BOLUS VIA INFUSION
2000.0000 [IU] | Freq: Once | INTRAVENOUS | Status: AC
  Administered 2020-02-15: 08:00:00 2000 [IU] via INTRAVENOUS
  Filled 2020-02-15: qty 2000

## 2020-02-15 MED ORDER — INSULIN DETEMIR 100 UNIT/ML ~~LOC~~ SOLN
8.0000 [IU] | Freq: Every day | SUBCUTANEOUS | Status: DC
  Filled 2020-02-15: qty 0.08

## 2020-02-15 NOTE — Care Management (Signed)
PCCM pick up from Dr. Belia Heman  59 year old male with past medical history of hypertension, diabetes mellitus, GERD, polysubstance abuse, brain aneurysm, who was admitted to ICU due to acute metabolic encephalopathy, DKA, AKI, cocaine intoxication.  Patient also has possible aspiration pneumonia on Unasyn.  Patient was found to have ST elevation, started on IV heparin.  Cardiology is on board, did not do cardiac cath. Patient is DNR.  Patient is extubated, currently alert, following command.  Hemodynamically stable.  Lorretta Harp

## 2020-02-15 NOTE — Consult Note (Signed)
ANTICOAGULATION CONSULT NOTE   Pharmacy Consult for Heparin  Indication: ACS / STEMI  No Known Allergies  Patient Measurements: Height: 5\' 10"  (177.8 cm) Weight: 61.7 kg (136 lb 0.4 oz) IBW/kg (Calculated) : 73 Heparin Dosing Weight: 65.5 kg  Vital Signs: Temp: 100 F (37.8 C) (04/11 0400) BP: 116/71 (04/11 0400) Pulse Rate: 87 (04/11 0400)  Labs: Recent Labs    02/13/20 1535 02/13/20 1602 02/13/20 1602 02/13/20 1729 02/14/20 0120 02/14/20 0500 02/14/20 0857 02/14/20 1333 02/14/20 1904 02/15/20 0507  HGB 14.1  --   --   --   --  12.9*  --   --   --   --   HCT 52.8*  --   --   --   --  35.2*  --   --   --   --   PLT 309  --   --   --   --  219  --   --   --   --   APTT 29  --   --   --   --   --   --   --   --   --   LABPROT 12.2  --   --   --   --   --   --   --   --   --   INR 0.9  --   --   --   --   --   --   --   --   --   HEPARINUNFRC  --   --   --   --    < >  --  0.55  --  0.43 0.10*  CREATININE  --  4.71*   < > 4.61*   < > 3.08* 2.65* 2.32*  --  1.39*  TROPONINIHS  --  138*  --  115*  --   --   --   --   --   --    < > = values in this interval not displayed.    Estimated Creatinine Clearance: 48.7 mL/min (A) (by C-G formula based on SCr of 1.39 mg/dL (H)).   Medications:  No PTA anticoagulant -confirmed with nurse  Assessment: Pharmacy has been consulted in heparin dosing in patient with ACS. Baseline labs ordered and appropriate.    Goal of Therapy:  Heparin level 0.3-0.7 units/ml Monitor platelets by anticoagulation protocol: Yes   Plan:  4/11 @ 0500 0.10 subtherapeutic. Will rebolus w/ heparin 2000 units IV x 1 and increase rate to 800 and will recheck HL at 1300 and will continue to monitor.  6/11, PharmD 02/15/2020,7:07 AM

## 2020-02-15 NOTE — Progress Notes (Addendum)
Inpatient Diabetes Program Recommendations  AACE/ADA: New Consensus Statement on Inpatient Glycemic Control (2015)  Target Ranges:  Prepandial:   less than 140 mg/dL      Peak postprandial:   less than 180 mg/dL (1-2 hours)      Critically ill patients:  140 - 180 mg/dL   Lab Results  Component Value Date   GLUCAP 91 02/15/2020   HGBA1C 12.2 (H) 11/26/2019    Review of Glycemic Control Results for FERN, ASMAR (MRN [id number]) as of 02/15/2020 09:15  Ref. Range 02/14/2020 11:26 02/14/2020 12:34 02/14/2020 13:22 02/14/2020 16:18 02/14/2020 16:50 02/14/2020 19:35 02/14/2020 23:36 02/14/2020 23:58 02/15/2020 04:50 02/15/2020 05:18 02/15/2020 07:29  Glucose-Capillary Latest Ref Range: 70 - 99 mg/dL 229 (H) 798 (H) 921 (H) 59 (L) 159 (H) 106 (H) 48 (L) 127 (H) 25 (LL) 118 (H) 91   Diabetes history: DM 2 Outpatient Diabetes medications: Lantus 22 units Daily, Novolog 0-30 units tid Current orders for Inpatient glycemic control:  Levemir 8 units bid Novolog 1-3 units Q4 hours  Inpatient Diabetes Program Recommendations:    Noted consult for severe DKA. Will follow pt closely  Glucose trends with hypoglycemia on Levemir 8 units bid.   Consider reducing dose to either 4 units bid or 8 units Daily. Pt is responding more sensitively to insulin than anticipated.  Thanks,  Christena Deem RN, MSN, BC-ADM Inpatient Diabetes Coordinator Team Pager [phone] (8a-5p)

## 2020-02-15 NOTE — Consult Note (Signed)
ANTICOAGULATION CONSULT NOTE   Pharmacy Consult for Heparin  Indication: ACS / STEMI  No Known Allergies  Patient Measurements: Height: 5\' 10"  (177.8 cm) Weight: 61.7 kg (136 lb 0.4 oz) IBW/kg (Calculated) : 73 Heparin Dosing Weight: 65.5 kg  Vital Signs: Temp: 99.1 F (37.3 C) (04/11 1300) Temp Source: Bladder (04/11 1200) BP: 140/92 (04/11 1200) Pulse Rate: 75 (04/11 1300)  Labs: Recent Labs    02/13/20 1535 02/13/20 1602 02/13/20 1602 02/13/20 1729 02/14/20 0120 02/14/20 0500 02/14/20 0857 02/14/20 0857 02/14/20 1333 02/14/20 1904 02/15/20 0507 02/15/20 1257  HGB 14.1  --   --   --   --  12.9*  --   --   --   --   --   --   HCT 52.8*  --   --   --   --  35.2*  --   --   --   --   --   --   PLT 309  --   --   --   --  219  --   --   --   --   --   --   APTT 29  --   --   --   --   --   --   --   --   --   --   --   LABPROT 12.2  --   --   --   --   --   --   --   --   --   --   --   INR 0.9  --   --   --   --   --   --   --   --   --   --   --   HEPARINUNFRC  --   --   --   --    < >  --  0.55   < >  --  0.43 0.10* 0.36  CREATININE  --  4.71*   < > 4.61*   < > 3.08* 2.65*  --  2.32*  --  1.39*  --   TROPONINIHS  --  138*  --  115*  --   --   --   --   --   --   --   --    < > = values in this interval not displayed.    Estimated Creatinine Clearance: 48.7 mL/min (A) (by C-G formula based on SCr of 1.39 mg/dL (H)).   Medications:  No PTA anticoagulant -confirmed with nurse  Assessment: Pharmacy has been consulted in heparin dosing in patient with ACS. Baseline labs ordered and appropriate.    Goal of Therapy:  Heparin level 0.3-0.7 units/ml Monitor platelets by anticoagulation protocol: Yes   Plan:  4/11 1257 HL 0.36 therapeutic x 1. Recheck HL at 1900 to confirm. CBC daily while on heparin drip.  6/11, PharmD 02/15/2020,1:51 PM

## 2020-02-15 NOTE — Progress Notes (Signed)
[place], [place] 02/15/20  Subjective:   Hospital day # 2 Patient presented to the emergency room on April 9 via EMS for code STEMI.  Patient was found by his roommate the previous night throwing up for 10 hours per report.  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  Patient was diagnosed with DKA with glucose of 1400, CO2 less than 7 and potassium of 7.4.  He was admitted for further evaluation and management.   Baseline creatinine of 1.10.  Admission creatinine of 4.71   Cvs: no pressors Pulm: Extubated now.  Able to follow commands  Renal: 04/10 0701 - 04/11 0700 In: 3277.8 [I.V.:1727.7; NG/GT:800; IV Piggyback:750.1] Out: 1350 [Urine:1350] Lab Results  Component Value Date   CREATININE 1.39 (H) 02/15/2020   CREATININE 2.32 (H) 02/14/2020   CREATININE 2.65 (H) 02/14/2020     Objective:  Vital signs in last 24 hours:  Temp:  [97.5 F (36.4 C)-100 F (37.8 C)] 98.4 F (36.9 C) (04/11 1400) Pulse Rate:  [65-87] 75 (04/11 1300) Resp:  [10-25] 10 (04/10 2300) BP: (106-147)/(71-92) 137/84 (04/11 1400) SpO2:  [92 %-99 %] 99 % (04/11 1400) FiO2 (%):  [30 %] 30 % (04/10 1618) Weight:  [61.7 kg] 61.7 kg (04/11 0500)  Weight change: -4.797 kg Filed Weights   02/13/20 1855 02/14/20 0307 02/15/20 0500  Weight: 61.6 kg 61.5 kg 61.7 kg    Intake/Output:    Intake/Output Summary (Last 24 hours) at 02/15/2020 1518 Last data filed at 02/15/2020 1339 Gross per 24 hour  Intake 1575.79 ml  Output 1950 ml  Net -374.21 ml     Physical Exam: General: Critically ill appearing  HEENT Anicteric, moist oral mucous membranes  Pulm/lungs Coarse,   CVS/Heart Regular, no rub  Abdomen:  Soft, non distended  Extremities: No edema  Neurologic:  Confused, able to follow simple commands  Skin: No rashes          Basic Metabolic Panel:  Recent Labs  Lab 02/13/20 1602 02/13/20 1729 02/14/20 0120 02/14/20 0120 02/14/20 0500  02/14/20 0500 02/14/20 0857 02/14/20 1333 02/15/20 0507  NA 132*   < > 147*  --  150*  --  146* 147* 151*  K 7.4*   < > 3.2*  --  3.3*  --  3.6 3.4* 3.4*  CL 93*   < > 110  --  112*  --  111 113* 114*  CO2 <7*   < > 24  --  28  --  28 28 29   GLUCOSE 1,424*   < > 538*  --  279*  --  164* 124* 136*  BUN 90*   < > 80*  --  78*  --  75* 70* 46*  CREATININE 4.71*   < > 3.39*  --  3.08*  --  2.65* 2.32* 1.39*  CALCIUM 9.4   < > 8.4*   < > 8.5*   < > 8.1* 8.0* 8.5*  MG 3.6*  --   --   --  2.5*  --   --   --  2.7*  PHOS 13.3*  --   --   --  2.1*  --   --   --  2.3*   < > = values in this interval not displayed.     CBC: Recent Labs  Lab 02/13/20 1535 02/14/20 0500  WBC 21.4* 10.3  NEUTROABS 17.9*  --   HGB 14.1 12.9*  HCT 52.8* 35.2*  MCV 118.4* 86.1  PLT 309 219     No results found for: HEPBSAG, HEPBSAB, HEPBIGM    Microbiology:  Recent Results (from the past 240 hour(s))  Blood culture (routine x 2)     Status: None (Preliminary result)   Collection Time: 02/13/20  4:04 PM   Specimen: BLOOD  Result Value Ref Range Status   Specimen Description BLOOD LEFT ANTECUBITAL  Final   Special Requests   Final    BOTTLES DRAWN AEROBIC AND ANAEROBIC Blood Culture adequate volume   Culture   Final    NO GROWTH 2 DAYS Performed at [facility], [address], [place], [place] [address]    Report Status PENDING  Incomplete  Blood culture (routine x 2)     Status: None (Preliminary result)   Collection Time: 02/13/20  4:09 PM   Specimen: BLOOD  Result Value Ref Range Status   Specimen Description BLOOD RIGHT ANTECUBITAL  Final   Special Requests   Final    BOTTLES DRAWN AEROBIC AND ANAEROBIC Blood Culture adequate volume   Culture  Setup Time PENDING  Incomplete   Culture   Final    NO GROWTH 2 DAYS Performed at [facility], [address], [place], [place] [address]    Report Status PENDING  Incomplete  Respiratory Panel by RT PCR (Flu A&B, Covid) -  Nasopharyngeal Swab     Status: None   Collection Time: 02/13/20  5:29 PM   Specimen: Nasopharyngeal Swab  Result Value Ref Range Status   SARS Coronavirus 2 by RT PCR NEGATIVE NEGATIVE Final    Comment: (NOTE) SARS-CoV-2 target nucleic acids are NOT DETECTED. The SARS-CoV-2 RNA is generally detectable in upper respiratoy specimens during the acute phase of infection. The lowest concentration of SARS-CoV-2 viral copies this assay can detect is 131 copies/mL. A negative result does not preclude SARS-Cov-2 infection and should not be used as the sole basis for treatment or other patient management decisions. A negative result may occur with  improper specimen collection/handling, submission of specimen other than nasopharyngeal swab, presence of viral mutation(s) within the areas targeted by this assay, and inadequate number of viral copies (<131 copies/mL). A negative result must be combined with clinical observations, patient history, and epidemiological information. The expected result is Negative. Fact Sheet for Patients:  PinkCheek.be Fact Sheet for Healthcare Providers:  GravelBags.it This test is not yet ap proved or cleared by the [place] FDA and  has been authorized for detection and/or diagnosis of SARS-CoV-2 by FDA under an Emergency Use Authorization (EUA). This EUA will remain  in effect (meaning this test can be used) for the duration of the COVID-19 declaration under Section 564(b)(1) of the Act, 21 U.S.C. section 360bbb-3(b)(1), unless the authorization is terminated or revoked sooner.    Influenza A by PCR NEGATIVE NEGATIVE Final   Influenza B by PCR NEGATIVE NEGATIVE Final    Comment: (NOTE) The Xpert Xpress SARS-CoV-2/FLU/RSV assay is intended as an aid in  the diagnosis of influenza from Nasopharyngeal swab specimens and  should not be used as a sole basis for treatment. Nasal washings and  aspirates  are unacceptable for Xpert Xpress SARS-CoV-2/FLU/RSV  testing. Fact Sheet for Patients: PinkCheek.be Fact Sheet for Healthcare Providers: GravelBags.it This test is not yet approved or cleared by the [place] FDA and  has been authorized for detection and/or diagnosis of SARS-CoV-2 by  FDA under an Emergency Use Authorization (EUA). This EUA will remain  in effect (meaning this test can  be used) for the duration of the  Covid-19 declaration under Section 564(b)(1) of the Act, 21  U.S.C. section 360bbb-3(b)(1), unless the authorization is  terminated or revoked. Performed at [facility], [address] [place], [place] [address]   MRSA PCR Screening     Status: None   Collection Time: 02/13/20  6:52 PM   Specimen: Nasopharyngeal  Result Value Ref Range Status   MRSA by PCR NEGATIVE NEGATIVE Final    Comment:        The GeneXpert MRSA Assay (FDA approved for NASAL specimens only), is one component of a comprehensive MRSA colonization surveillance program. It is not intended to diagnose MRSA infection nor to guide or monitor treatment for MRSA infections. Performed at [facility], [address] [place], [place] [address]   Culture, respiratory (non-expectorated)     Status: None (Preliminary result)   Collection Time: 02/14/20 11:18 AM   Specimen: Tracheal Aspirate; Respiratory  Result Value Ref Range Status   Specimen Description   Final    TRACHEAL ASPIRATE Performed at [facility], [address] [place], [place] [address]    Special Requests   Final    Normal Performed at [facility], [address] [place], [place] [address]    Gram Stain   Final    RARE WBC PRESENT, PREDOMINANTLY MONONUCLEAR FEW BUDDING YEAST SEEN    Culture   Final    CULTURE REINCUBATED FOR BETTER GROWTH Performed at [facility], [place] [address], [place],  [address]     Report Status PENDING  Incomplete    Coagulation Studies: Recent Labs    02/13/20 1535  LABPROT 12.2  INR 0.9    Urinalysis: Recent Labs    02/13/20 1535  COLORURINE YELLOW*  LABSPEC 1.020  PHURINE 5.0  GLUCOSEU >=500*  HGBUR SMALL*  BILIRUBINUR NEGATIVE  KETONESUR 20*  PROTEINUR 30*  NITRITE NEGATIVE  LEUKOCYTESUR NEGATIVE      Imaging: DG Chest 1 View  Result Date: 02/13/2020 CLINICAL DATA:  Status post intubation. EXAM: CHEST  1 VIEW COMPARISON:  December 15, 2019. FINDINGS: The heart size and mediastinal contours are within normal limits. Endotracheal tube is in grossly good position. Nasogastric tube is seen entering right mainstem bronchus; withdrawal is recommended. No pneumothorax or pleural effusion is noted. Lungs are clear. The visualized skeletal structures are unremarkable. IMPRESSION: Endotracheal tube in grossly good position. Nasogastric tube is seen entering right mainstem bronchus; withdrawal is recommended. Critical Value/emergent results were called by telephone at the time of interpretation on 02/13/2020 at 4:22 pm to provider Duffy Bruce , who verbally acknowledged these results. Electronically Signed   By: Marijo Conception M.D.   On: 02/13/2020 16:22   DG Abdomen 1 View  Result Date: 02/13/2020 CLINICAL DATA:  NG tube EXAM: ABDOMEN - 1 VIEW COMPARISON:  07/27/2019 FINDINGS: NG tube is in the stomach.  Nonobstructive bowel gas pattern. IMPRESSION: NG tube in the stomach. Electronically Signed   By: Rolm Baptise M.D.   On: 02/13/2020 17:11   CT Head Wo Contrast  Result Date: 02/13/2020 CLINICAL DATA:  58 year old male with encephalopathy. EXAM: CT HEAD WITHOUT CONTRAST TECHNIQUE: Contiguous axial images were obtained from the base of the skull through the vertex without intravenous contrast. COMPARISON:  Head CT dated 08/19/2019. FINDINGS: Brain: The ventricles and sulci appropriate size for patient's age. Probable tiny old lacunar infarct in the left lentiform  nucleus. The gray-white matter discrimination is preserved. There is  no acute intracranial hemorrhage. No mass effect or midline shift. No extra-axial fluid collection. Vascular: No hyperdense vessel or unexpected calcification. Skull: No acute calvarial pathology. Left frontal and parietal burr holes. Sinuses/Orbits: The visualized paranasal sinuses and mastoid air cells are clear. Cerumen noted in the right external auditory canal. Other: None IMPRESSION: No acute intracranial pathology. Electronically Signed   By: Anner Crete M.D.   On: 02/13/2020 16:44   US RENAL  Result Date: 02/14/2020 CLINICAL DATA:  Acute renal injury EXAM: RENAL / URINARY TRACT ULTRASOUND COMPLETE COMPARISON:  None. FINDINGS: Right Kidney: Renal measurements: 11.9 x 4.9 x 5.1 cm = volume: 157 mL . Echogenicity within normal limits. No mass or hydronephrosis visualized. Left Kidney: Renal measurements: 12.1 x 6.3 x 5.7 cm = volume: 230 mL. Echogenicity within normal limits. No mass or hydronephrosis visualized. Bladder: Decompressed with a Foley catheter. Other: None. IMPRESSION: No acute abnormalities are identified. The bladder is poorly evaluated due to decompression with a Foley catheter. Electronically Signed   By: Dorise Bullion III M.D   On: 02/14/2020 13:03   DG Chest Port 1 View  Result Date: 02/14/2020 CLINICAL DATA:  NG tube EXAM: PORTABLE CHEST 1 VIEW COMPARISON:  Radiograph 02/13/2020 FINDINGS: Stable cardiac silhouette. Endotracheal tube unchanged. Introduction of NG tube with side port below the GE junction. Lungs are clear.  No pneumothorax.  Anterior cervical fusion noted. IMPRESSION: 1. NG tube with side port below the GE junction in good position. 2. Lungs are clear. Electronically Signed   By: Suzy Bouchard M.D.   On: 02/14/2020 08:37   ECHOCARDIOGRAM COMPLETE  Result Date: 02/15/2020    ECHOCARDIOGRAM REPORT   Patient Name:   Spencer [name] Date of Exam: 02/15/2020 Medical Rec #:  [id number]    Height:        70.0 in Accession #:    [id number]   Weight:       136.0 lb Date of Birth:  [birth year]     BSA:          1.772 m Patient Age:    59 years     BP:           116/71 mmHg Patient Gender: M            HR:           77 bpm. Exam Location:  [facility] Procedure: 2D Echo Indications:     ABNORMAL ECG 794.31/ R94.31  History:         Patient has no prior history of Echocardiogram examinations and                  Patient has prior history of Echocardiogram examinations, most                  recent 06/03/2019. Risk Factors:Hypertension, Diabetes and                  Current Smoker.  Sonographer:     Avanell Shackleton Referring Phys:  [id number] Bradly Bienenstock Diagnosing Phys: Kate Sable MD IMPRESSIONS  1. Left ventricular ejection fraction, by estimation, is 60 to 65%. The left ventricle has normal function. The left ventricle has no regional wall motion abnormalities. Left ventricular diastolic parameters are consistent with Grade I diastolic dysfunction (impaired relaxation).  2. Right ventricular systolic function is normal. The right ventricular size is normal.  3. The mitral valve is normal in structure. No evidence of mitral valve regurgitation. No evidence of mitral  stenosis.  4. The aortic valve is grossly normal. Aortic valve regurgitation is not visualized. No aortic stenosis is present.  5. Aortic root is Ectatic. There is borderline dilatation of the aortic root measuring 39 mm.  6. The inferior vena cava is normal in size with greater than 50% respiratory variability, suggesting right atrial pressure of 3 mmHg. FINDINGS  Left Ventricle: Left ventricular ejection fraction, by estimation, is 60 to 65%. The left ventricle has normal function. The left ventricle has no regional wall motion abnormalities. The left ventricular internal cavity size was normal in size. There is  no left ventricular hypertrophy. Left ventricular diastolic parameters are consistent with Grade I diastolic dysfunction (impaired relaxation).  Right Ventricle: The right ventricular size is normal. No increase in right ventricular wall thickness. Right ventricular systolic function is normal. Left Atrium: Left atrial size was normal in size. Right Atrium: Right atrial size was normal in size. Pericardium: There is no evidence of pericardial effusion. Mitral Valve: The mitral valve is normal in structure. Normal mobility of the mitral valve leaflets. No evidence of mitral valve regurgitation. No evidence of mitral valve stenosis. Tricuspid Valve: The tricuspid valve is normal in structure. Tricuspid valve regurgitation is trivial. No evidence of tricuspid stenosis. Aortic Valve: The aortic valve is grossly normal. Aortic valve regurgitation is not visualized. No aortic stenosis is present. Pulmonic Valve: The pulmonic valve was normal in structure. Pulmonic valve regurgitation is trivial. No evidence of pulmonic stenosis. Aorta: Root is Ectatic. There is borderline dilatation of the aortic root measuring 39 mm. Venous: The inferior vena cava is normal in size with greater than 50% respiratory variability, suggesting right atrial pressure of 3 mmHg. IAS/Shunts: No atrial level shunt detected by color flow Doppler.  LEFT VENTRICLE PLAX 2D LVIDd:         4.72 cm  Diastology LVIDs:         3.23 cm  LV e' lateral:   8.81 cm/s LV PW:         1.39 cm  LV E/e' lateral: 8.2 LV IVS:        1.17 cm  LV e' medial:    5.87 cm/s LVOT diam:     2.30 cm  LV E/e' medial:  12.3 LVOT Area:     4.15 cm  RIGHT VENTRICLE RV Mid diam:    4.24 cm LEFT ATRIUM             Index LA diam:        3.90 cm 2.20 cm/m LA Vol (A2C):   46.9 ml 26.47 ml/m LA Vol (A4C):   36.4 ml 20.54 ml/m LA Biplane Vol: 42.5 ml 23.99 ml/m   AORTA Ao Root diam: 3.90 cm MITRAL VALVE MV Area (PHT): 3.05 cm    SHUNTS MV Decel Time: 249 msec    Systemic Diam: 2.30 cm MV E velocity: 72.20 cm/s MV A velocity: 91.90 cm/s MV E/A ratio:  0.79 Kate Sable MD Electronically signed by Kate Sable MD  Signature Date/Time: 02/15/2020/2:07:36 PM    Final      Medications:   . sodium chloride Stopped (02/14/20 0031)  . sodium chloride    . ampicillin-sulbactam (UNASYN) IV    . dextrose 75 mL/hr at 02/15/20 1200  . famotidine (PEPCID) IV Stopped (02/14/20 2228)  . heparin 800 Units/hr (02/15/20 1200)  . lactated ringers    . norepinephrine (LEVOPHED) Adult infusion Stopped (02/14/20 0816)   . albuterol  5 mg Nebulization Once  . aspirin  81 mg Per Tube Daily  . atorvastatin  40 mg Oral q1800  . chlorhexidine gluconate (MEDLINE KIT)  15 mL Mouth Rinse BID  . Chlorhexidine Gluconate Cloth  6 each Topical Daily  . free water  200 mL Per Tube Q4H  . insulin aspart  1-3 Units Subcutaneous Q4H  . [START ON 02/16/2020] insulin detemir  8 Units Subcutaneous Daily  . mouth rinse  15 mL Mouth Rinse 10 times per day  . potassium & sodium phosphates  2 packet Oral Q4H  . sodium chloride flush  3 mL Intravenous Q12H   sodium chloride, acetaminophen, docusate sodium, fentaNYL (SUBLIMAZE) injection, midazolam, midazolam, ondansetron (ZOFRAN) IV, polyethylene glycol, sodium chloride flush  Assessment/ Plan:  59 y.o. male with diabetes, polysubstance abuse, cocaine, amphetamine, admitted on 02/13/2020 for Respiratory failure ([place]) [J96.90] Hyperkalemia [E87.5] AKI (acute kidney injury) ([place]) [N17.9] Diabetic ketoacidosis with coma associated with type 1 diabetes mellitus ([place]) [E10.11]   #Acute kidney injury with severe hyperkalemia Likely secondary to ATN from hemodynamic instability Patient is non-oliguric s Creatinine is improving with IV hydration Continue supportive care  #Hypernatremia Agree with D5W Now that patient is extubated and able to take p.o., expected to correct in the next 1 to 2 days  #Diabetic ketoacidosis Management as per icu Improving  #Polysubstance abuse, urine drug screen positive for cocaine #STEMI #Acute respiratory failure Extubated now  We will sign off  please reconsult as necessary         LOS: 2 Myshawn [place] Candiss Norse 4/11/20213:18 PM  [place], [place]  Note: This note was prepared with Dragon dictation. Any transcription errors are unintentional

## 2020-02-15 NOTE — Progress Notes (Signed)
CRITICAL CARE NOTE BRIEF DESCRIPTION: 59 y.o. Male with PMH significant for polysubstance abuse, DKA, and medical noncompliance, who is admitted 02/13/20 with severe metabolic encephalopathy and metabolic acidosis in the setting of severe DKA, AKI with hyperkalemia, and cocaine intoxication. There is also concern for possible aspiration pneumonia.  Cardiology is following along, and consult has been placed to nephrology.  SIGNIFICANT EVENTS/STUDIES: 4/9: Admission to ICU 4/9: Cardiology and Nephrology consulted 4/9: Family made pt DNR/DNI 4/9: CT Head>>No acute intracranial pathology. 4/10: Echocardiogram 4/10: Renal US 4/10 ETT dislodged , patient extubated, protecting airway somnelant +COCAINE AND HEROINE USE  CULTURES: SARS-CoV-2 PCR 4/9>> negative Influenza PCR 4/9>> negative MRSA PCR 4/9>> negative Blood culture x2 4/9>> Tracheal aspirate 4/9>> Strep pneumo urinary antigen 4/9>> Legionella urinary antigen 4/9>>  ANTIBIOTICS: Cefepime 4/9>>4/9 Vancomycin 4/9>>4/9 Unasyn 4/9>> CC  follow up respiratory failure  HPI Remains lethargic but arousable Protecting airway DNR/DNI NG in place    BP 116/71   Pulse 87   Temp 100 F (37.8 C)   Resp 10   Ht 5\' 10"  (1.778 m)   Wt 61.7 kg   SpO2 92%   BMI 19.52 kg/m    I/O last 3 completed shifts: In: 7578.1 [I.V.:3803; NG/GT:800; IV Piggyback:2975.1] Out: 2865 [Urine:2515; Emesis/NG output:350] No intake/output data recorded.  SpO2: 92 % O2 Flow Rate (L/min): 3 L/min FiO2 (%): 30 %  Estimated body mass index is 19.52 kg/m as calculated from the following:   Height as of this encounter: 5\' 10"  (1.778 m).   Weight as of this encounter: 61.7 kg.  SIGNIFICANT EVENTS   REVIEW OF SYSTEMS  PATIENT IS UNABLE TO PROVIDE COMPLETE REVIEW OF SYSTEMS DUE TO SEVERE COCAINE ABUSE AND DELERIUM  Pressure Injury 02/14/20 Buttocks Mid;Upper Deep Tissue Pressure Injury - Purple or maroon localized area of discolored intact  skin or blood-filled blister due to damage of underlying soft tissue from pressure and/or shear. intact blister to scarum betwe (Active)  02/14/20 0845  Location: Buttocks  Location Orientation: Mid;Upper  Staging: Deep Tissue Pressure Injury - Purple or maroon localized area of discolored intact skin or blood-filled blister due to damage of underlying soft tissue from pressure and/or shear.  Wound Description (Comments): intact blister to scarum between buttocks, surrounding skin purple in color  Present on Admission:       PHYSICAL EXAMINATION:  GENERAL: ill appearing, thin and cachexia HEAD: Normocephalic, atraumatic.  EYES: Pupils equal, round, reactive to light.  No scleral icterus.  MOUTH: Moist mucosal membrane. NECK: Supple.  PULMONARY: +rhonchi,  CARDIOVASCULAR: S1 and S2. Regular rate and rhythm. No murmurs, rubs, or gallops.  GASTROINTESTINAL: Soft, nontender, -distended.  Positive bowel sounds.   MUSCULOSKELETAL: No swelling, clubbing, or edema.  NEUROLOGIC: lethargic SKIN:intact,warm,dry  MEDICATIONS: I have reviewed all medications and confirmed regimen as documented   CULTURE RESULTS   Recent Results (from the past 240 hour(s))  Blood culture (routine x 2)     Status: None (Preliminary result)   Collection Time: 02/13/20  4:04 PM   Specimen: BLOOD  Result Value Ref Range Status   Specimen Description BLOOD LEFT ANTECUBITAL  Final   Special Requests   Final    BOTTLES DRAWN AEROBIC AND ANAEROBIC Blood Culture adequate volume   Culture   Final    NO GROWTH 2 DAYS Performed at [facility], [address] [place], [address] [place]    Report Status PENDING  Incomplete  Blood culture (routine x 2)     Status: None (Preliminary  result)   Collection Time: 02/13/20  4:09 PM   Specimen: BLOOD  Result Value Ref Range Status   Specimen Description BLOOD RIGHT ANTECUBITAL  Final   Special Requests   Final    BOTTLES DRAWN AEROBIC AND ANAEROBIC Blood  Culture adequate volume   Culture  Setup Time PENDING  Incomplete   Culture   Final    NO GROWTH 2 DAYS Performed at [facility], [address] [place], [place] [address]    Report Status PENDING  Incomplete  Respiratory Panel by RT PCR (Flu A&B, Covid) - Nasopharyngeal Swab     Status: None   Collection Time: 02/13/20  5:29 PM   Specimen: Nasopharyngeal Swab  Result Value Ref Range Status   SARS Coronavirus 2 by RT PCR NEGATIVE NEGATIVE Final    Comment: (NOTE) SARS-CoV-2 target nucleic acids are NOT DETECTED. The SARS-CoV-2 RNA is generally detectable in upper respiratoy specimens during the acute phase of infection. The lowest concentration of SARS-CoV-2 viral copies this assay can detect is 131 copies/mL. A negative result does not preclude SARS-Cov-2 infection and should not be used as the sole basis for treatment or other patient management decisions. A negative result may occur with  improper specimen collection/handling, submission of specimen other than nasopharyngeal swab, presence of viral mutation(s) within the areas targeted by this assay, and inadequate number of viral copies (<131 copies/mL). A negative result must be combined with clinical observations, patient history, and epidemiological information. The expected result is Negative. Fact Sheet for Patients:  https://www.moore.com/ Fact Sheet for Healthcare Providers:  https://www.young.biz/ This test is not yet ap proved or cleared by the [place] FDA and  has been authorized for detection and/or diagnosis of SARS-CoV-2 by FDA under an Emergency Use Authorization (EUA). This EUA will remain  in effect (meaning this test can be used) for the duration of the COVID-19 declaration under Section 564(b)(1) of the Act, 21 U.S.C. section 360bbb-3(b)(1), unless the authorization is terminated or revoked sooner.    Influenza A by PCR NEGATIVE NEGATIVE Final    Influenza B by PCR NEGATIVE NEGATIVE Final    Comment: (NOTE) The Xpert Xpress SARS-CoV-2/FLU/RSV assay is intended as an aid in  the diagnosis of influenza from Nasopharyngeal swab specimens and  should not be used as a sole basis for treatment. Nasal washings and  aspirates are unacceptable for Xpert Xpress SARS-CoV-2/FLU/RSV  testing. Fact Sheet for Patients: https://www.moore.com/ Fact Sheet for Healthcare Providers: https://www.young.biz/ This test is not yet approved or cleared by the [place] FDA and  has been authorized for detection and/or diagnosis of SARS-CoV-2 by  FDA under an Emergency Use Authorization (EUA). This EUA will remain  in effect (meaning this test can be used) for the duration of the  Covid-19 declaration under Section 564(b)(1) of the Act, 21  U.S.C. section 360bbb-3(b)(1), unless the authorization is  terminated or revoked. Performed at [facility], [address] [place], [place] [address]   MRSA PCR Screening     Status: None   Collection Time: 02/13/20  6:52 PM   Specimen: Nasopharyngeal  Result Value Ref Range Status   MRSA by PCR NEGATIVE NEGATIVE Final    Comment:        The GeneXpert MRSA Assay (FDA approved for NASAL specimens only), is one component of a comprehensive MRSA colonization surveillance program. It is not intended to diagnose MRSA infection nor to guide or monitor treatment for MRSA infections. Performed at [facility], [phone] [place], [place] [address]   Culture, respiratory (non-expectorated)     Status: None (Preliminary result)   Collection Time: 02/14/20 11:18 AM   Specimen: Tracheal Aspirate; Respiratory  Result Value Ref Range Status   Specimen Description   Final    TRACHEAL ASPIRATE Performed at [facility], [address], [place], [place] [address]    Special Requests   Final    Normal Performed at [facility], [address] [place], [place] [address]    Gram Stain   Final    RARE WBC PRESENT, PREDOMINANTLY MONONUCLEAR FEW BUDDING YEAST SEEN Performed at [facility], [place] [address], [place], [place] [address]    Culture PENDING  Incomplete   Report Status PENDING  Incomplete          IMAGING    US RENAL  Result Date: 02/14/2020 CLINICAL DATA:  Acute renal injury EXAM: RENAL / URINARY TRACT ULTRASOUND COMPLETE COMPARISON:  None. FINDINGS: Right Kidney: Renal measurements: 11.9 x 4.9 x 5.1 cm = volume: 157 mL . Echogenicity within normal limits. No mass or hydronephrosis visualized. Left Kidney: Renal measurements: 12.1 x 6.3 x 5.7 cm = volume: 230 mL. Echogenicity within normal limits. No mass or hydronephrosis visualized. Bladder: Decompressed with a Foley catheter. Other: None. IMPRESSION: No acute abnormalities are identified. The bladder is poorly evaluated due to decompression with a Foley catheter. Electronically Signed   By: Dorise Bullion III M.D   On: 02/14/2020 13:03    BMP Latest Ref Rng & Units 02/15/2020 02/14/2020 02/14/2020  Glucose 70 - 99 mg/dL 136(H) 124(H) 164(H)  BUN 8 - 23 mg/dL 46(H) 70(H) 75(H)  Creatinine 0.61 - 1.24 mg/dL 1.39(H) 2.32(H) 2.65(H)  Sodium 135 - 145 mmol/L 151(H) 147(H) 146(H)  Potassium 3.5 - 5.1 mmol/L 3.4(L) 3.4(L) 3.6  Chloride 98 - 111 mmol/L 114(H) 113(H) 111  CO2 22 - 32 mmol/L 29 28 28   Calcium 8.9 - 10.3 mg/dL 8.5(L) 8.0(L) 8.1(L)       ASSESSMENT AND PLAN SYNOPSIS Severe acute hypoxic and hypercapnic respiratory failure due to severe metabolic acidosis -resolving  PATIENT IS DNR/DNI  DKA-resolving  ASPIRATION PNEUMONIA Continue IV abx  Delerium from cocaine and heroine  Acute NSTEMI heperain infusion Follow up cardiology recs  ACUTE KIDNEY INJURY/Renal Failure -follow chem 7 -follow UO -continue Foley Catheter-assess need -Avoid nephrotoxic agents -Recheck creatinine     CARDIAC ICU monitoring  ID -continue  IV abx as prescibed -follow up cultures  GI GI PROPHYLAXIS as indicated  NUTRITIONAL STATUS Nutrition Status:         DIET-->as tolerated Constipation protocol as indicated  ENDO - will use ICU hypoglycemic\Hyperglycemia protocol if indicated   ELECTROLYTES -follow labs as needed -replace as needed -pharmacy consultation and following   GI PRX ordered TRANSFUSIONS AS NEEDED MONITOR FSBS ASSESS the need for LABS as needed  TRANSFER TO [facility] 4/12  Corrin Parker, M.D.  [facility]  Medical Director [place] Director [place] Department

## 2020-02-15 NOTE — Consult Note (Signed)
PHARMACY CONSULT NOTE   Pharmacy Consult for Electrolyte Monitoring and Replacement; glucose monitoring, and constipation  Recent Labs: Potassium (mmol/L)  Date Value  02/15/2020 3.4 (L)   Magnesium (mg/dL)  Date Value  91/91/6606 2.7 (H)   Calcium (mg/dL)  Date Value  00/45/9977 8.5 (L)   Albumin (g/dL)  Date Value  41/42/3953 4.0   Phosphorus (mg/dL)  Date Value  20/23/3435 2.3 (L)   Sodium (mmol/L)  Date Value  02/15/2020 151 (H)     Assessment: 59 year old male with known history of polysubstance abuse including cocaine and amphetamine, diabetes mellitus with recurrent presentation with DKA due to poor compliance and history of acute kidney injury in the setting of DKA. Patient's renal function has improved to the point that CRRT was not needed, initially patient was hyperkalemic s/t AKI now in hypokalemia w/ improving AKI.   Goal of Electrolytes:  Magnesium ~2, Potassium ~ 4   Plan:  Phos-NAK q4h x 3 doses. Electrolytes ordered with morning labs.  Pricilla Riffle ,PharmD Clinical Pharmacist 02/15/2020 7:55 AM

## 2020-02-15 NOTE — Consult Note (Signed)
ANTICOAGULATION CONSULT NOTE   Pharmacy Consult for Heparin  Indication: ACS / STEMI  No Known Allergies  Patient Measurements: Height: 5\' 10"  (177.8 cm) Weight: 61.7 kg (136 lb 0.4 oz) IBW/kg (Calculated) : 73 Heparin Dosing Weight: 65.5 kg  Vital Signs: Temp: 98.4 F (36.9 C) (04/11 1400) Temp Source: Oral (04/11 1400) BP: 152/104 (04/11 1930) Pulse Rate: 82 (04/11 1930)  Labs: Recent Labs    02/13/20 1535 02/13/20 1602 02/13/20 1602 02/13/20 1729 02/14/20 0120 02/14/20 0500 02/14/20 0857 02/14/20 1333 02/14/20 1904 02/15/20 0507 02/15/20 1257 02/15/20 1856  HGB 14.1  --   --   --   --  12.9*  --   --   --   --   --   --   HCT 52.8*  --   --   --   --  35.2*  --   --   --   --   --   --   PLT 309  --   --   --   --  219  --   --   --   --   --   --   APTT 29  --   --   --   --   --   --   --   --   --   --   --   LABPROT 12.2  --   --   --   --   --   --   --   --   --   --   --   INR 0.9  --   --   --   --   --   --   --   --   --   --   --   HEPARINUNFRC  --   --   --   --    < >  --  0.55  --    < > 0.10* 0.36 0.28*  CREATININE  --  4.71*   < > 4.61*   < > 3.08* 2.65* 2.32*  --  1.39*  --   --   TROPONINIHS  --  138*  --  115*  --   --   --   --   --   --   --   --    < > = values in this interval not displayed.    Estimated Creatinine Clearance: 48.7 mL/min (A) (by C-G formula based on SCr of 1.39 mg/dL (H)).   Medications:  No PTA anticoagulant -confirmed with nurse  Assessment: Pharmacy has been consulted in heparin dosing in patient with ACS. Baseline labs ordered and appropriate.    4/11 1930 HL 0.28, subtherapeutic. Confirmed with nurse no heparin interruptions.   Goal of Therapy:  Heparin level 0.3-0.7 units/ml Monitor platelets by anticoagulation protocol: Yes   Plan:  INCREASE heparin rate to 900 units/hr. Informed nursing of changes. Recheck HL in 6 hours after rate change. CBC daily while on heparin drip.  6/11,  PharmD 02/15/2020,7:56 PM

## 2020-02-16 ENCOUNTER — Other Ambulatory Visit: Payer: Self-pay

## 2020-02-16 DIAGNOSIS — J9601 Acute respiratory failure with hypoxia: Secondary | ICD-10-CM | POA: Diagnosis not present

## 2020-02-16 LAB — BASIC METABOLIC PANEL
Anion gap: 9 (ref 5–15)
BUN: 24 mg/dL — ABNORMAL HIGH (ref 8–23)
CO2: 27 mmol/L (ref 22–32)
Calcium: 8.6 mg/dL — ABNORMAL LOW (ref 8.9–10.3)
Chloride: 104 mmol/L (ref 98–111)
Creatinine, Ser: 1.06 mg/dL (ref 0.61–1.24)
GFR calc Af Amer: >60 mL/min (ref >60)
GFR calc non Af Amer: >60 mL/min (ref >60)
Glucose, Bld: 302 mg/dL — ABNORMAL HIGH (ref 70–99)
Potassium: 3.6 mmol/L (ref 3.5–5.1)
Sodium: 140 mmol/L (ref 135–145)

## 2020-02-16 LAB — HEMOGLOBIN A1C
Hgb A1c MFr Bld: 12.5 % — ABNORMAL HIGH (ref 4.8–5.6)
Mean Plasma Glucose: 312 mg/dL

## 2020-02-16 LAB — CBC
HCT: 37.1 % — ABNORMAL LOW (ref 39.0–52.0)
Hemoglobin: 12.6 g/dL — ABNORMAL LOW (ref 13.0–17.0)
MCH: 31.1 pg (ref 26.0–34.0)
MCHC: 34 g/dL (ref 30.0–36.0)
MCV: 91.6 fL (ref 80.0–100.0)
Platelets: 145 10*3/uL — ABNORMAL LOW (ref 150–400)
RBC: 4.05 MIL/uL — ABNORMAL LOW (ref 4.22–5.81)
RDW: 13.2 % (ref 11.5–15.5)
WBC: 6 10*3/uL (ref 4.0–10.5)
nRBC: 0 % (ref 0.0–0.2)

## 2020-02-16 LAB — HEPARIN LEVEL (UNFRACTIONATED)
Heparin Unfractionated: 0.32 IU/mL (ref 0.30–0.70)
Heparin Unfractionated: 0.29 IU/mL — ABNORMAL LOW (ref 0.30–0.70)

## 2020-02-16 LAB — GLUCOSE, CAPILLARY
Glucose-Capillary: >600 mg/dL (ref 70–99)
Glucose-Capillary: 402 mg/dL — ABNORMAL HIGH (ref 70–99)
Glucose-Capillary: 202 mg/dL — ABNORMAL HIGH (ref 70–99)
Glucose-Capillary: 237 mg/dL — ABNORMAL HIGH (ref 70–99)
Glucose-Capillary: 110 mg/dL — ABNORMAL HIGH (ref 70–99)
Glucose-Capillary: 412 mg/dL — ABNORMAL HIGH (ref 70–99)

## 2020-02-16 LAB — LEGIONELLA PNEUMOPHILA SEROGP 1 UR AG: L. pneumophila Serogp 1 Ur Ag: NEGATIVE

## 2020-02-16 LAB — PHOSPHORUS: Phosphorus: 2.5 mg/dL (ref 2.5–4.6)

## 2020-02-16 MED ORDER — PANTOPRAZOLE SODIUM 40 MG PO TBEC
40.0000 mg | DELAYED_RELEASE_TABLET | Freq: Every day | ORAL | Status: DC
  Administered 2020-02-17: 40 mg via ORAL
  Filled 2020-02-16: qty 1

## 2020-02-16 MED ORDER — AMLODIPINE BESYLATE 5 MG PO TABS: 5.0000 mg | ORAL_TABLET | Freq: Every day | ORAL | Status: DC

## 2020-02-16 MED ORDER — ENOXAPARIN SODIUM 40 MG/0.4ML ~~LOC~~ SOLN
40.0000 mg | SUBCUTANEOUS | Status: DC
  Administered 2020-02-17: 40 mg via SUBCUTANEOUS
  Filled 2020-02-16: qty 0.4

## 2020-02-16 MED ORDER — INSULIN ASPART 100 UNIT/ML ~~LOC~~ SOLN: 0.0000 [IU] | Freq: Three times a day (TID) | SUBCUTANEOUS | Status: DC

## 2020-02-16 MED ORDER — INSULIN ASPART 100 UNIT/ML ~~LOC~~ SOLN
0.0000 [IU] | Freq: Three times a day (TID) | SUBCUTANEOUS | Status: DC
  Administered 2020-02-16: 3 [IU] via SUBCUTANEOUS
  Filled 2020-02-16: qty 1

## 2020-02-16 MED ORDER — INSULIN ASPART 100 UNIT/ML ~~LOC~~ SOLN
0.0000 [IU] | SUBCUTANEOUS | Status: DC
  Administered 2020-02-16: 7 [IU] via SUBCUTANEOUS
  Administered 2020-02-16: 05:00:00 20 [IU] via SUBCUTANEOUS
  Filled 2020-02-16: qty 1

## 2020-02-16 MED ORDER — RISAQUAD PO CAPS
1.0000 | ORAL_CAPSULE | Freq: Three times a day (TID) | ORAL | Status: DC
  Administered 2020-02-16 – 2020-02-17 (×4): 1 via ORAL
  Filled 2020-02-16 (×5): qty 1

## 2020-02-16 MED ORDER — INSULIN ASPART 100 UNIT/ML ~~LOC~~ SOLN
5.0000 [IU] | Freq: Three times a day (TID) | SUBCUTANEOUS | Status: DC
  Administered 2020-02-16 (×2): 5 [IU] via SUBCUTANEOUS
  Filled 2020-02-16 (×2): qty 1

## 2020-02-16 MED ORDER — HYDRALAZINE HCL 50 MG PO TABS
25.0000 mg | ORAL_TABLET | Freq: Four times a day (QID) | ORAL | Status: DC | PRN
  Administered 2020-02-16: 23:00:00 25 mg via ORAL
  Filled 2020-02-16: qty 1

## 2020-02-16 MED ORDER — INSULIN ASPART 100 UNIT/ML ~~LOC~~ SOLN
10.0000 [IU] | Freq: Once | SUBCUTANEOUS | Status: AC
  Administered 2020-02-16: 10 [IU] via SUBCUTANEOUS
  Filled 2020-02-16: qty 1

## 2020-02-16 NOTE — Progress Notes (Signed)
Inpatient Diabetes Program Recommendations  AACE/ADA: New Consensus Statement on Inpatient Glycemic Control   Target Ranges:  Prepandial:   less than 140 mg/dL      Peak postprandial:   less than 180 mg/dL (1-2 hours)      Critically ill patients:  140 - 180 mg/dL  Results for JUVON, TEATER (MRN [id number]) as of 02/16/2020 07:40  Ref. Range 02/16/2020 03:57 02/16/2020 07:33  Glucose-Capillary Latest Ref Range: 70 - 99 mg/dL 469 (H) 629 (H)   Results for ASHAZ, ROBLING (MRN [id number]) as of 02/16/2020 07:40  Ref. Range 02/15/2020 04:50 02/15/2020 05:18 02/15/2020 07:29 02/15/2020 11:46 02/15/2020 16:02 02/15/2020 20:23 02/15/2020 21:15  Glucose-Capillary Latest Ref Range: 70 - 99 mg/dL 25 (LL) 010 (H) 91 272 (H) 367 (H) 377 (H) 377 (H)   Review of Glycemic Control   Outpatient Diabetes medications: Lantus 22 units daily, Novolog 0-30 units TID with meals Current orders for Inpatient glycemic control: Levemir 10 units daily, Novolog 0-20 units Q4H, Novolog 2 units TID with meals for meal coverage  Inpatient Diabetes Program Recommendations:   Correction (SSI): Please consider decreasing Novolog correction to sensitive scale (0-9 units Q4H).  Insulin - Meal Coverage: Please consider increasing meal coverage to Novolog 5 units TID with meals if patient eats at least 50% of meals.  NOTE: Patient is well known to inpatient diabetes team.Patient was admitted 08/07/19, 08/19/19, 09/04/19, 09/12/19, and 10/24/19, 11/26/19, 12/15/19. Last seen by diabetes coordinator on 11/27/19 during prior admission. Have encouraged patient to re-establish care with Endocrinologist multiple times.Noted patient cocaine positive(07/27/19, 08/07/19, 08/19/19, 09/04/19, 09/13/19, 12/16/19, and 02/13/20)whichis likelyeffecting patient's ability and willingness to manage DM.  In reviewing glucose trends over the past 24 hours, noted patient had hypoglycemia on Levemir 8 units BID so Levemir was changed to 10 units daily on 02/15/20.  Glucose up to 402 mg/dl at 5:36 am today and patient received Novolog 20 units at 4:38 am today. Recommend decreasing Novolog correction scale and increasing meal coverage insulin. Will continue to follow along while inpatient.  Thanks, Orlando Penner, RN, MSN, CDE Diabetes Coordinator Inpatient Diabetes Program [phone] (Team Pager from 8am to 5pm)

## 2020-02-16 NOTE — TOC Initial Note (Signed)
Transition of Care [facility]) - Initial/Assessment Note    Patient Details  Name: Spencer [name] MRN: [id number] Date of Birth: 22-May-1961  [facility] CM/SW Contact:    Magnus Ivan, LCSW Phone Number: 02/16/2020, 2:17 PM  Clinical Narrative:              CSW met with patient at bedside for Readmission Screening. Explained CSW role. Patient reported he lives with his father, sister, and nephew. Patient reported he uses [facility] representative on [address] and denied issues with obtaining medications. Patient says he drives himself to appointments. Patient reported he has a RW and cane, but does not use them. Patient said he does not have a PCP. CSW provided [employer]. Patient denied SA resources or other resource needs. Patient said "yall always ask me the same thing." CSW encouraged patient to reach out if needs arise. CSW will continue to follow.         Expected Discharge Plan: Home/Self Care Barriers to Discharge: Continued Medical Work up   Patient Goals and CMS Choice        Expected Discharge Plan and Services Expected Discharge Plan: Home/Self Care       Living arrangements for the past 2 months: Single Family Home                                      Prior Living Arrangements/Services Living arrangements for the past 2 months: Single Family Home Lives with:: Parents, Siblings, Relatives Patient language and need for interpreter reviewed:: Yes Do you feel safe going back to the place where you live?: Yes      Need for Family Participation in Patient Care: Yes (Comment) Care giver support system in place?: Yes (comment) Current home services: DME Criminal Activity/Legal Involvement Pertinent to Current Situation/Hospitalization: No - Comment as needed  Activities of Daily Living      Permission Sought/Granted                  Emotional Assessment Appearance:: Appears younger than stated  age Attitude/Demeanor/Rapport: Engaged Affect (typically observed): Calm, Blunt Orientation: : Oriented to Self, Oriented to Place, Oriented to  Time, Oriented to Situation Alcohol / Substance Use: Illicit Drugs Psych Involvement: No (comment)  Admission diagnosis:  Respiratory failure ([place]) [J96.90] Hyperkalemia [E87.5] AKI (acute kidney injury) ([place]) [N17.9] Diabetic ketoacidosis with coma associated with type 1 diabetes mellitus ([place]) [E10.11] Patient Active Problem List   Diagnosis Date Noted  . Respiratory failure ([place]) 02/13/2020  . Weakness   . Lobar pneumonia ([place])   . Hyperglycemia   . Tobacco abuse counseling   . Atrial fibrillation with RVR ([place]) 08/07/2019  . GERD (gastroesophageal reflux disease) 08/07/2019  . Acute renal failure ([place])   . Hyperkalemia   . Sepsis ([place]) 06/09/2019  . AKI (acute kidney injury) ([place]) 02/19/2019  . Hypoglycemia 10/23/2018  . Chronic low back pain 01/16/2017  . Chronic neck pain 01/16/2017  . Diabetic neuropathy ([place]) 01/16/2017  . DM2 (diabetes mellitus, type 2) ([place]) 01/16/2017  . History of cocaine abuse ([place]) 01/16/2017  . Personal history of subdural hematoma 01/16/2017  . Closed displaced fracture of body of left calcaneus with delayed healing 12/13/2016  . Protein-calorie malnutrition, severe 11/08/2016  . DKA, type 2 ([place]) 11/06/2016  . DKA (diabetic ketoacidoses) ([place]) 04/03/2015  . Hypertension 04/03/2015  . Depression 04/03/2015  . Opiate  dependence ([place]) 04/03/2015  . Tobacco abuse 04/03/2015  . Lumbosacral neuritis 07/19/2014  . Pain of finger of right hand 07/19/2014  . Type II or unspecified type diabetes mellitus without mention of complication, not stated as uncontrolled 07/19/2014  . Knee pain 09/12/2013  . Hernia of flank 09/20/2012  . Epidermoid cyst of skin 09/20/2012  . Chronic pain of both shoulders 03/27/2012   PCP:  Jodi Marble, MD Pharmacy:   [facility] [place], [place] - [address] AT [facility] [place] [address] Phone: [phone] Fax: [phone]     Social Determinants of Health (SDOH) Interventions    Readmission Risk Interventions Readmission Risk Prevention Plan 02/16/2020 12/17/2019 11/27/2019  Transportation Screening Complete Complete Complete  PCP or Specialist Appt within 3-5 Days - - -  [place] or [place] or Home Care Consult comments - - -  Palliative Care Screening - - -  Medication Review ([place]) Complete Complete Complete  PCP or Specialist appointment within 3-5 days of discharge Complete Patient refused Patient refused  [place] or [place] Complete Patient refused -  SW Recovery Care/Counseling Consult - - -  SW Consult Not Complete Comments - - -  Palliative Care Screening - Not Applicable Not Applicable  Skilled Nursing Facility Complete Not Applicable Not Applicable  Some recent data might be hidden

## 2020-02-16 NOTE — Progress Notes (Signed)
New order 10U novolog ordered

## 2020-02-16 NOTE — Progress Notes (Signed)
PROGRESS NOTE    Spencer [name]   JKK:[id number]  DOB: Dec 22, 1960  PCP: Jodi Marble, MD    DOA: 02/13/2020 LOS: 3   Brief Narrative   59 year old male with past medical history of hypertension, diabetes, GERD, polysubstance abuse, brain aneurysm who was admitted to ICU on 4/9 with acute metabolic encephalopathy, metabolic acidosis secondary to severe DKA, AKI and cocaine intoxication.  He presented as a code STEMI after being found down by his roommate the night before after having 10 hours of vomiting possibly secondary to heroin abuse the night before.  Glucose on presentation was 1400, CO2 less than 7, potassium 7.4.  Nephrology and cardiology consulted.  Patient being treated for possible aspiration pneumonia with Unasyn.   Assessment & Plan   Active Problems:   Respiratory failure (HCC)  Acute respiratory failure with hypoxia and hypercapnia -resolved --Patient now DNR/DNI  Possible aspiration pneumonia -continue Unasyn and probiotics, follow-up cultures  Acute metabolic encephalopathy -resolved.  Secondary to substance abuse and DKA  Acute kidney injury - Nephrology following.  Monitor BMP.  Avoid nephrotoxins and hypotension.  Renally dose meds as indicated  Hyperkalemia - present on admission, secondary to renal failure. Resolved  DKA -resolved   Type 2 diabetes complicated by peripheral neuropathy-uncontrolled, A1c 12.5%.  Currently on Levemir 10 units daily, NovoLog 5 units 3 times daily with meals, sensitive sliding scale NovoLog.  Glucose improving but still in 200s today, 402 overnight.  Abnormal EKG -presented as code STEMI.  Cardiology consulted.  Treated with heparin drip. EKG changes improved once metabolic abnormalities improved and patient free of chest pain.  Similar presentation in October 2020 and cardiac cath at that time showed nonobstructive coronary disease normal EF.  Echo this admission showed EF 60 to 71%, grade 1 diastolic dysfunction.  Chronic  diastolic CHF -likely secondary to polysubstance abuse and medical noncompliance.  EF 60 to 65% with grade 1 diastolic dysfunction.  Hypertension: Start amlodipine 5 mg daily.  There are hydralazine. Polysubstance abuse: UDS +cocaine and heroin Tobacco abuse   Patient BMI: Body mass index is 19.55 kg/m.   DVT prophylaxis: Lovenox Diet:  Diet Orders (From admission, onward)    Start     Ordered   02/15/20 1208  Diet Carb Modified Fluid consistency: Thin; Room service appropriate? Yes  Diet effective now    Question Answer Comment  Diet-HS Snack? Nothing   Calorie Level Medium 1600-2000   Fluid consistency: Thin   Room service appropriate? Yes      02/15/20 1207            Code Status: DNR    Subjective 02/16/20    Patient seen at bedside this morning.  No acute events reported.  States he feels terrible.  Has not slept since he has been here.  Denies any fevers chills or cough.  Also denies chest pain, nausea vomiting.   Disposition Plan & Communication   Dispo & Barriers: Anticipate discharge home in 24 to 48 hours.  No anticipated barriers.  PT evaluation pending. Coming from:, Lives with father, sister and nephew Exp d/c date: 4/13 Medically stable for d/c?  No  Family Communication: None at bedside during encounter, will attempt to call   Consults, Procedures, Significant Events   Consultants:   Cardiology  Nephrology  Antimicrobials:  Cefepime 4/9>>4/9 Vancomycin 4/9>>4/9 Unasyn 4/9>>  Significant Events: Per PCCM: 4/9: Admission to ICU 4/9: Cardiology and Nephrology consulted 4/9: Family made pt DNR/DNI 4/9: CT Head>>No acute intracranial pathology.  4/10: Echocardiogram 4/10: Renal US 4/10: ETT dislodged , patient extubated, protecting airway somnelant 4/12: Transferred to hospitalist service  Objective   Vitals:   02/16/20 0442 02/16/20 0749 02/16/20 0800 02/16/20 0900  BP:   129/75   Pulse:  79 79 80  Resp:      Temp:  98 F (36.7 C)     TempSrc:  Oral    SpO2:  94% 95% 96%  Weight: 61.8 kg     Height:        Intake/Output Summary (Last 24 hours) at 02/16/2020 0913 Last data filed at 02/16/2020 0836 Gross per 24 hour  Intake 2976.25 ml  Output 5551 ml  Net -2574.75 ml   Filed Weights   02/14/20 0307 02/15/20 0500 02/16/20 0442  Weight: 61.5 kg 61.7 kg 61.8 kg    Physical Exam:  General exam: awake, alert, no acute distress, disheveled HEENT: moist mucus membranes, hearing grossly normal  Respiratory system: Decreased breath sounds at the bases but otherwise clear, no wheezes, rales or rhonchi, normal respiratory effort. Cardiovascular system: normal S1/S2, RRR, no JVD, murmurs, rubs, gallops, no pedal edema.   Gastrointestinal system: soft, NT, ND, no HSM felt, +bowel sounds. Central nervous system: A&O x4. no gross focal neurologic deficits, normal speech Extremities: moves all, no edema, normal tone  Labs   Data Reviewed: I have personally reviewed following labs and imaging studies  CBC: Recent Labs  Lab 02/13/20 1535 02/14/20 0500 02/16/20 0156  WBC 21.4* 10.3 6.0  NEUTROABS 17.9*  --   --   HGB 14.1 12.9* 12.6*  HCT 52.8* 35.2* 37.1*  MCV 118.4* 86.1 91.6  PLT 309 219 354*   Basic Metabolic Panel: Recent Labs  Lab 02/13/20 1602 02/13/20 1729 02/14/20 0500 02/14/20 0500 02/14/20 0857 02/14/20 1333 02/15/20 0507 02/15/20 2129 02/16/20 0156  NA 132*   < > 150*   < > 146* 147* 151* 140 140  K 7.4*   < > 3.3*   < > 3.6 3.4* 3.4* 3.7 3.6  CL 93*   < > 112*   < > 111 113* 114* 103 104  CO2 <7*   < > 28   < > 28 28 29 27 27   GLUCOSE 1,424*   < > 279*   < > 164* 124* 136* 383* 302*  BUN 90*   < > 78*   < > 75* 70* 46* 30* 24*  CREATININE 4.71*   < > 3.08*   < > 2.65* 2.32* 1.39* 1.29* 1.06  CALCIUM 9.4   < > 8.5*   < > 8.1* 8.0* 8.5* 8.7* 8.6*  MG 3.6*  --  2.5*  --   --   --  2.7* 2.1  --   PHOS 13.3*  --  2.1*  --   --   --  2.3*  --  2.5   < > = values in this interval not displayed.     GFR: Estimated Creatinine Clearance: 64 mL/min (by C-G formula based on SCr of 1.06 mg/dL). Liver Function Tests: Recent Labs  Lab 02/13/20 1602  AST 22  ALT 20  ALKPHOS 98  BILITOT 2.3*  PROT 6.4*  ALBUMIN 4.0   Recent Labs  Lab 02/13/20 1602  LIPASE 47   No results for input(s): AMMONIA in the last 168 hours. Coagulation Profile: Recent Labs  Lab 02/13/20 1535  INR 0.9   Cardiac Enzymes: No results for input(s): CKTOTAL, CKMB, CKMBINDEX, TROPONINI in the last 168 hours. BNP (  last 3 results) No results for input(s): PROBNP in the last 8760 hours. HbA1C: No results for input(s): HGBA1C in the last 72 hours. CBG: Recent Labs  Lab 02/15/20 1602 02/15/20 2023 02/15/20 2115 02/16/20 0357 02/16/20 0733  GLUCAP 367* 377* 377* 402* 202*   Lipid Profile: Recent Labs    02/13/20 1602 02/14/20 0500  CHOL 230* 137  HDL 29* 33*  LDLCALC UNABLE TO CALCULATE IF TRIGLYCERIDE OVER 400 mg/dL 92  TRIG 634* 61  CHOLHDL 7.9 4.2  LDLDIRECT 74.4  --    Thyroid Function Tests: No results for input(s): TSH, T4TOTAL, FREET4, T3FREE, THYROIDAB in the last 72 hours. Anemia Panel: No results for input(s): VITAMINB12, FOLATE, FERRITIN, TIBC, IRON, RETICCTPCT in the last 72 hours. Sepsis Labs: Recent Labs  Lab 02/13/20 1535 02/13/20 1729 02/14/20 0500 02/14/20 0857 02/15/20 0507  PROCALCITON  --  1.56 4.30  --  1.10  LATICACIDVEN 5.6* 2.7*  --  1.3  --     Recent Results (from the past 240 hour(s))  Blood culture (routine x 2)     Status: None (Preliminary result)   Collection Time: 02/13/20  4:04 PM   Specimen: BLOOD  Result Value Ref Range Status   Specimen Description BLOOD LEFT ANTECUBITAL  Final   Special Requests   Final    BOTTLES DRAWN AEROBIC AND ANAEROBIC Blood Culture adequate volume   Culture   Final    NO GROWTH 3 DAYS Performed at [facility], [address] [place], [place] [address]    Report Status PENDING  Incomplete  Blood culture  (routine x 2)     Status: None (Preliminary result)   Collection Time: 02/13/20  4:09 PM   Specimen: BLOOD  Result Value Ref Range Status   Specimen Description BLOOD RIGHT ANTECUBITAL  Final   Special Requests   Final    BOTTLES DRAWN AEROBIC AND ANAEROBIC Blood Culture adequate volume   Culture  Setup Time PENDING  Incomplete   Culture   Final    NO GROWTH 3 DAYS Performed at [facility], [address] [place], [place] [address]    Report Status PENDING  Incomplete  Respiratory Panel by RT PCR (Flu A&B, Covid) - Nasopharyngeal Swab     Status: None   Collection Time: 02/13/20  5:29 PM   Specimen: Nasopharyngeal Swab  Result Value Ref Range Status   SARS Coronavirus 2 by RT PCR NEGATIVE NEGATIVE Final    Comment: (NOTE) SARS-CoV-2 target nucleic acids are NOT DETECTED. The SARS-CoV-2 RNA is generally detectable in upper respiratoy specimens during the acute phase of infection. The lowest concentration of SARS-CoV-2 viral copies this assay can detect is 131 copies/mL. A negative result does not preclude SARS-Cov-2 infection and should not be used as the sole basis for treatment or other patient management decisions. A negative result may occur with  improper specimen collection/handling, submission of specimen other than nasopharyngeal swab, presence of viral mutation(s) within the areas targeted by this assay, and inadequate number of viral copies (<131 copies/mL). A negative result must be combined with clinical observations, patient history, and epidemiological information. The expected result is Negative. Fact Sheet for Patients:  PinkCheek.be Fact Sheet for Healthcare Providers:  GravelBags.it This test is not yet ap proved or cleared by the [place] FDA and  has been authorized for detection and/or diagnosis of SARS-CoV-2 by FDA under an Emergency Use Authorization (EUA). This EUA will remain  in  effect (meaning this test can be  used) for the duration of the COVID-19 declaration under Section 564(b)(1) of the Act, 21 U.S.C. section 360bbb-3(b)(1), unless the authorization is terminated or revoked sooner.    Influenza A by PCR NEGATIVE NEGATIVE Final   Influenza B by PCR NEGATIVE NEGATIVE Final    Comment: (NOTE) The Xpert Xpress SARS-CoV-2/FLU/RSV assay is intended as an aid in  the diagnosis of influenza from Nasopharyngeal swab specimens and  should not be used as a sole basis for treatment. Nasal washings and  aspirates are unacceptable for Xpert Xpress SARS-CoV-2/FLU/RSV  testing. Fact Sheet for Patients: PinkCheek.be Fact Sheet for Healthcare Providers: GravelBags.it This test is not yet approved or cleared by the [place] FDA and  has been authorized for detection and/or diagnosis of SARS-CoV-2 by  FDA under an Emergency Use Authorization (EUA). This EUA will remain  in effect (meaning this test can be used) for the duration of the  Covid-19 declaration under Section 564(b)(1) of the Act, 21  U.S.C. section 360bbb-3(b)(1), unless the authorization is  terminated or revoked. Performed at [facility], [address] [place], [place] [address]   MRSA PCR Screening     Status: None   Collection Time: 02/13/20  6:52 PM   Specimen: Nasopharyngeal  Result Value Ref Range Status   MRSA by PCR NEGATIVE NEGATIVE Final    Comment:        The GeneXpert MRSA Assay (FDA approved for NASAL specimens only), is one component of a comprehensive MRSA colonization surveillance program. It is not intended to diagnose MRSA infection nor to guide or monitor treatment for MRSA infections. Performed at [facility], [address] [place], [place] [address]   Culture, respiratory (non-expectorated)     Status: None (Preliminary result)   Collection Time: 2020/02/23 11:18 AM   Specimen: Tracheal  Aspirate; Respiratory  Result Value Ref Range Status   Specimen Description   Final    TRACHEAL ASPIRATE Performed at [facility], [address] [place], [place] [address]    Special Requests   Final    Normal Performed at [facility], [address] [place], [place] [address]    Gram Stain   Final    RARE WBC PRESENT, PREDOMINANTLY MONONUCLEAR FEW BUDDING YEAST SEEN    Culture   Final    CULTURE REINCUBATED FOR BETTER GROWTH Performed at [facility], [place] [address], [place],  [address]    Report Status PENDING  Incomplete      Imaging Studies   US RENAL  Result Date: 2020/02/23 CLINICAL DATA:  Acute renal injury EXAM: RENAL / URINARY TRACT ULTRASOUND COMPLETE COMPARISON:  None. FINDINGS: Right Kidney: Renal measurements: 11.9 x 4.9 x 5.1 cm = volume: 157 mL . Echogenicity within normal limits. No mass or hydronephrosis visualized. Left Kidney: Renal measurements: 12.1 x 6.3 x 5.7 cm = volume: 230 mL. Echogenicity within normal limits. No mass or hydronephrosis visualized. Bladder: Decompressed with a Foley catheter. Other: None. IMPRESSION: No acute abnormalities are identified. The bladder is poorly evaluated due to decompression with a Foley catheter. Electronically Signed   By: Dorise Bullion III M.D   On: 02-23-20 13:03   ECHOCARDIOGRAM COMPLETE  Result Date: 02/15/2020    ECHOCARDIOGRAM REPORT   Patient Name:   Garald Braver Date of Exam: 02/15/2020 Medical Rec #:  [id number]    Height:       70.0 in Accession #:    [id number]   Weight:  136.0 lb Date of Birth:  10-09-1961     BSA:          1.772 m Patient Age:    89 years     BP:           116/71 mmHg Patient Gender: M            HR:           77 bpm. Exam Location:  [facility] Procedure: 2D Echo Indications:     ABNORMAL ECG 794.31/ R94.31  History:         Patient has no prior history of Echocardiogram examinations and                  Patient has prior history of Echocardiogram  examinations, most                  recent 06/03/2019. Risk Factors:Hypertension, Diabetes and                  Current Smoker.  Sonographer:     Avanell Shackleton Referring Phys:  [id number] Bradly Bienenstock Diagnosing Phys: Kate Sable MD IMPRESSIONS  1. Left ventricular ejection fraction, by estimation, is 60 to 65%. The left ventricle has normal function. The left ventricle has no regional wall motion abnormalities. Left ventricular diastolic parameters are consistent with Grade I diastolic dysfunction (impaired relaxation).  2. Right ventricular systolic function is normal. The right ventricular size is normal.  3. The mitral valve is normal in structure. No evidence of mitral valve regurgitation. No evidence of mitral stenosis.  4. The aortic valve is grossly normal. Aortic valve regurgitation is not visualized. No aortic stenosis is present.  5. Aortic root is Ectatic. There is borderline dilatation of the aortic root measuring 39 mm.  6. The inferior vena cava is normal in size with greater than 50% respiratory variability, suggesting right atrial pressure of 3 mmHg. FINDINGS  Left Ventricle: Left ventricular ejection fraction, by estimation, is 60 to 65%. The left ventricle has normal function. The left ventricle has no regional wall motion abnormalities. The left ventricular internal cavity size was normal in size. There is  no left ventricular hypertrophy. Left ventricular diastolic parameters are consistent with Grade I diastolic dysfunction (impaired relaxation). Right Ventricle: The right ventricular size is normal. No increase in right ventricular wall thickness. Right ventricular systolic function is normal. Left Atrium: Left atrial size was normal in size. Right Atrium: Right atrial size was normal in size. Pericardium: There is no evidence of pericardial effusion. Mitral Valve: The mitral valve is normal in structure. Normal mobility of the mitral valve leaflets. No evidence of mitral valve  regurgitation. No evidence of mitral valve stenosis. Tricuspid Valve: The tricuspid valve is normal in structure. Tricuspid valve regurgitation is trivial. No evidence of tricuspid stenosis. Aortic Valve: The aortic valve is grossly normal. Aortic valve regurgitation is not visualized. No aortic stenosis is present. Pulmonic Valve: The pulmonic valve was normal in structure. Pulmonic valve regurgitation is trivial. No evidence of pulmonic stenosis. Aorta: Root is Ectatic. There is borderline dilatation of the aortic root measuring 39 mm. Venous: The inferior vena cava is normal in size with greater than 50% respiratory variability, suggesting right atrial pressure of 3 mmHg. IAS/Shunts: No atrial level shunt detected by color flow Doppler.  LEFT VENTRICLE PLAX 2D LVIDd:         4.72 cm  Diastology LVIDs:         3.23 cm  LV e' lateral:   8.81 cm/s LV PW:         1.39 cm  LV E/e' lateral: 8.2 LV IVS:        1.17 cm  LV e' medial:    5.87 cm/s LVOT diam:     2.30 cm  LV E/e' medial:  12.3 LVOT Area:     4.15 cm  RIGHT VENTRICLE RV Mid diam:    4.24 cm LEFT ATRIUM             Index LA diam:        3.90 cm 2.20 cm/m LA Vol (A2C):   46.9 ml 26.47 ml/m LA Vol (A4C):   36.4 ml 20.54 ml/m LA Biplane Vol: 42.5 ml 23.99 ml/m   AORTA Ao Root diam: 3.90 cm MITRAL VALVE MV Area (PHT): 3.05 cm    SHUNTS MV Decel Time: 249 msec    Systemic Diam: 2.30 cm MV E velocity: 72.20 cm/s MV A velocity: 91.90 cm/s MV E/A ratio:  0.79 Kate Sable MD Electronically signed by Kate Sable MD Signature Date/Time: 02/15/2020/2:07:36 PM    Final      Medications   Scheduled Meds: . albuterol  5 mg Nebulization Once  . aspirin  81 mg Per Tube Daily  . atorvastatin  40 mg Oral q1800  . chlorhexidine gluconate (MEDLINE KIT)  15 mL Mouth Rinse BID  . Chlorhexidine Gluconate Cloth  6 each Topical Daily  . free water  200 mL Per Tube Q4H  . insulin aspart  0-9 Units Subcutaneous TID WC  . insulin aspart  5 Units  Subcutaneous TID WC  . insulin detemir  10 Units Subcutaneous Daily  . lactobacillus acidophilus  2 tablet Oral TID  . mouth rinse  15 mL Mouth Rinse 10 times per day  . sodium chloride flush  3 mL Intravenous Q12H   Continuous Infusions: . sodium chloride Stopped (02/14/20 0031)  . sodium chloride    . ampicillin-sulbactam (UNASYN) IV 200 mL/hr at 02/16/20 0444  . famotidine (PEPCID) IV Stopped (02/15/20 2147)  . lactated ringers Stopped (02/16/20 0442)       LOS: 3 days    Time spent: 40 minutes    Ezekiel Slocumb, DO [facility]   If 7PM-7AM, please contact night-coverage [email or web address] 02/16/2020, 9:13 AM

## 2020-02-16 NOTE — Consult Note (Signed)
ANTICOAGULATION CONSULT NOTE   Pharmacy Consult for Heparin  Indication: ACS / STEMI  No Known Allergies  Patient Measurements: Height: 5\' 10"  (177.8 cm) Weight: 61.7 kg (136 lb 0.4 oz) IBW/kg (Calculated) : 73 Heparin Dosing Weight: 65.5 kg  Vital Signs: Temp: 97.9 F (36.6 C) (04/11 1930) Temp Source: Oral (04/11 1930) BP: 143/92 (04/11 2200) Pulse Rate: 77 (04/11 2200)  Labs: Recent Labs    02/13/20 1535 02/13/20 1602 02/13/20 1602 02/13/20 1729 02/14/20 0120 02/14/20 0500 02/14/20 0857 02/15/20 0507 02/15/20 0507 02/15/20 1257 02/15/20 1856 02/15/20 2129 02/16/20 0156  HGB 14.1  --    < >  --   --  12.9*  --   --   --   --   --   --  12.6*  HCT 52.8*  --   --   --   --  35.2*  --   --   --   --   --   --  37.1*  PLT 309  --   --   --   --  219  --   --   --   --   --   --  145*  APTT 29  --   --   --   --   --   --   --   --   --   --   --   --   LABPROT 12.2  --   --   --   --   --   --   --   --   --   --   --   --   INR 0.9  --   --   --   --   --   --   --   --   --   --   --   --   HEPARINUNFRC  --   --   --   --    < >  --    < > 0.10*   < > 0.36 0.28*  --  0.32  CREATININE  --  4.71*   < > 4.61*   < > 3.08*   < > 1.39*  --   --   --  1.29* 1.06  TROPONINIHS  --  138*  --  115*  --   --   --   --   --   --   --   --   --    < > = values in this interval not displayed.    Estimated Creatinine Clearance: 63.9 mL/min (by C-G formula based on SCr of 1.06 mg/dL).   Medications:  No PTA anticoagulant -confirmed with nurse  Assessment: Pharmacy has been consulted in heparin dosing in patient with ACS. Baseline labs ordered and appropriate.    4/11 1930 HL 0.28, subtherapeutic. Confirmed with nurse no heparin interruptions.   Goal of Therapy:  Heparin level 0.3-0.7 units/ml Monitor platelets by anticoagulation protocol: Yes   Plan:  04/12 @ 0200 HL 0.32 therapeutic. Will continue current rate and will recheck HL at 0800 and continue to  monitor.  06/12, PharmD 02/16/2020,2:42 AM

## 2020-02-16 NOTE — Consult Note (Signed)
PHARMACY CONSULT NOTE   Pharmacy Consult for Electrolyte Monitoring and Replacement; glucose monitoring, and constipation  Recent Labs: Potassium (mmol/L)  Date Value  02/16/2020 3.6   Magnesium (mg/dL)  Date Value  47/65/4650 2.1   Calcium (mg/dL)  Date Value  35/46/5681 8.6 (L)   Albumin (g/dL)  Date Value  27/51/7001 4.0   Phosphorus (mg/dL)  Date Value  74/94/4967 2.5   Sodium (mmol/L)  Date Value  02/16/2020 140     Assessment: 59 year old male with known history of polysubstance abuse including cocaine and amphetamine, diabetes mellitus with recurrent presentation with DKA due to poor compliance and history of acute kidney injury in the setting of DKA. Patient's renal function has improved to the point that CRRT was not needed, initially patient was hyperkalemic s/t AKI now in hypokalemia w/ improving AKI.   Goal of Electrolytes:  Magnesium ~2, Potassium ~ 4   Plan:  Electrolytes stable. Will sign off.  Pricilla Riffle ,PharmD Clinical Pharmacist 02/16/2020 2:23 PM

## 2020-02-16 NOTE — Progress Notes (Signed)
Jon Billings NP made aware that pt BP 151/101-new order for IV hydralazine for SBP >170, restart norvasc in the am ordered,  also aware of blood sugar 412-awaiting order for BS

## 2020-02-17 DIAGNOSIS — J9601 Acute respiratory failure with hypoxia: Secondary | ICD-10-CM | POA: Diagnosis not present

## 2020-02-17 LAB — CULTURE, RESPIRATORY W GRAM STAIN: Special Requests: NORMAL

## 2020-02-17 LAB — CBC WITH DIFFERENTIAL/PLATELET
Abs Immature Granulocytes: 0.02 10*3/uL (ref 0.00–0.07)
Basophils Absolute: 0 10*3/uL (ref 0.0–0.1)
Basophils Relative: 0 %
Eosinophils Absolute: 0.1 10*3/uL (ref 0.0–0.5)
Eosinophils Relative: 2 %
HCT: 40.9 % (ref 39.0–52.0)
Hemoglobin: 13.8 g/dL (ref 13.0–17.0)
Immature Granulocytes: 0 %
Lymphocytes Relative: 27 %
Lymphs Abs: 1.6 10*3/uL (ref 0.7–4.0)
MCH: 31.1 pg (ref 26.0–34.0)
MCHC: 33.7 g/dL (ref 30.0–36.0)
MCV: 92.1 fL (ref 80.0–100.0)
Monocytes Absolute: 0.3 10*3/uL (ref 0.1–1.0)
Monocytes Relative: 5 %
Neutro Abs: 3.8 10*3/uL (ref 1.7–7.7)
Neutrophils Relative %: 66 %
Platelets: 145 10*3/uL — ABNORMAL LOW (ref 150–400)
RBC: 4.44 MIL/uL (ref 4.22–5.81)
RDW: 12.5 % (ref 11.5–15.5)
WBC: 5.8 10*3/uL (ref 4.0–10.5)
nRBC: 0 % (ref 0.0–0.2)

## 2020-02-17 LAB — BASIC METABOLIC PANEL
Anion gap: 7 (ref 5–15)
BUN: 23 mg/dL (ref 8–23)
CO2: 25 mmol/L (ref 22–32)
Calcium: 9.2 mg/dL (ref 8.9–10.3)
Chloride: 108 mmol/L (ref 98–111)
Creatinine, Ser: 0.84 mg/dL (ref 0.61–1.24)
GFR calc Af Amer: >60 mL/min (ref >60)
GFR calc non Af Amer: >60 mL/min (ref >60)
Glucose, Bld: 184 mg/dL — ABNORMAL HIGH (ref 70–99)
Potassium: 3.5 mmol/L (ref 3.5–5.1)
Sodium: 140 mmol/L (ref 135–145)

## 2020-02-17 LAB — GLUCOSE, CAPILLARY
Glucose-Capillary: 153 mg/dL — ABNORMAL HIGH (ref 70–99)
Glucose-Capillary: 295 mg/dL — ABNORMAL HIGH (ref 70–99)
Glucose-Capillary: 362 mg/dL — ABNORMAL HIGH (ref 70–99)
Glucose-Capillary: 377 mg/dL — ABNORMAL HIGH (ref 70–99)

## 2020-02-17 LAB — HEMOGLOBIN A1C
Hgb A1c MFr Bld: 12.6 % — ABNORMAL HIGH (ref 4.8–5.6)
Mean Plasma Glucose: 315 mg/dL

## 2020-02-17 LAB — MAGNESIUM: Magnesium: 2.1 mg/dL (ref 1.7–2.4)

## 2020-02-17 MED ORDER — LOSARTAN POTASSIUM 25 MG PO TABS
25.0000 mg | ORAL_TABLET | Freq: Every day | ORAL | Status: DC
  Administered 2020-02-17: 09:00:00 25 mg via ORAL
  Filled 2020-02-17: qty 1

## 2020-02-17 MED ORDER — INSULIN ASPART 100 UNIT/ML FLEXPEN: 12.0000 [IU] | PEN_INJECTOR | Freq: Three times a day (TID) | SUBCUTANEOUS | 1 refills | Status: DC

## 2020-02-17 MED ORDER — LOSARTAN POTASSIUM 50 MG PO TABS: 50.0000 mg | ORAL_TABLET | Freq: Every day | ORAL | 1 refills | Status: DC

## 2020-02-17 MED ORDER — INSULIN DETEMIR 100 UNIT/ML ~~LOC~~ SOLN
15.0000 [IU] | Freq: Every day | SUBCUTANEOUS | Status: DC
  Administered 2020-02-17: 15 [IU] via SUBCUTANEOUS
  Filled 2020-02-17 (×2): qty 0.15

## 2020-02-17 MED ORDER — LOSARTAN POTASSIUM 50 MG PO TABS: 50.0000 mg | ORAL_TABLET | Freq: Every day | ORAL | Status: DC

## 2020-02-17 MED ORDER — AMLODIPINE BESYLATE 10 MG PO TABS
10.0000 mg | ORAL_TABLET | Freq: Every day | ORAL | Status: DC
  Administered 2020-02-17: 09:00:00 10 mg via ORAL
  Filled 2020-02-17: qty 1

## 2020-02-17 MED ORDER — AMLODIPINE BESYLATE 10 MG PO TABS: 10.0000 mg | ORAL_TABLET | Freq: Every day | ORAL | 1 refills | Status: AC

## 2020-02-17 MED ORDER — PANTOPRAZOLE SODIUM 40 MG PO TBEC: 40.0000 mg | DELAYED_RELEASE_TABLET | Freq: Every day | ORAL | 1 refills | Status: DC

## 2020-02-17 MED ORDER — INSULIN ASPART 100 UNIT/ML ~~LOC~~ SOLN
9.0000 [IU] | Freq: Three times a day (TID) | SUBCUTANEOUS | Status: DC
  Administered 2020-02-17 (×2): 9 [IU] via SUBCUTANEOUS
  Filled 2020-02-17 (×2): qty 1

## 2020-02-17 MED ORDER — HYDRALAZINE HCL 50 MG PO TABS: 50.0000 mg | ORAL_TABLET | Freq: Four times a day (QID) | ORAL | Status: DC | PRN

## 2020-02-17 MED ORDER — INSULIN ASPART 100 UNIT/ML ~~LOC~~ SOLN
0.0000 [IU] | SUBCUTANEOUS | Status: DC
  Administered 2020-02-17: 2 [IU] via SUBCUTANEOUS
  Administered 2020-02-17: 7 [IU] via SUBCUTANEOUS
  Administered 2020-02-17: 01:00:00 5 [IU] via SUBCUTANEOUS
  Administered 2020-02-17: 04:00:00 9 [IU] via SUBCUTANEOUS
  Filled 2020-02-17 (×4): qty 1

## 2020-02-17 MED ORDER — INSULIN GLARGINE 100 UNITS/ML SOLOSTAR PEN: 18.0000 [IU] | PEN_INJECTOR | Freq: Every day | SUBCUTANEOUS | 1 refills | Status: DC

## 2020-02-17 MED ORDER — ATORVASTATIN CALCIUM 40 MG PO TABS: 40.0000 mg | ORAL_TABLET | Freq: Every day | ORAL | 1 refills | Status: DC

## 2020-02-17 NOTE — Progress Notes (Signed)
Pt discharged via private vehicle. Discharge instructions explained and given to pt. No further questions at this time. No s/s of distress noted. IVs and tele box removed.

## 2020-02-17 NOTE — Discharge Summary (Signed)
Physician Discharge Summary  EVIN LOISEAU MIW:[id number] DOB: [birth year] DOA: 02/13/2020  PCP: No primary care provider on file.  Admit date: 02/13/2020 Discharge date: 02/23/2020  Admitted From: home Disposition:  home  Recommendations for Outpatient Follow-up:  1. Follow up with PCP in 1-2 weeks 2. Please obtain BMP/CBC in one week 3. Please follow up on glycemic control and insulin adjustments as needed.    Home Health: no Equipment/Devices: none  Discharge Condition: stable  CODE STATUS: DNR  Diet recommendation: Heart Healthy / Carb Modified   Brief/Interim Summary:  59 year old male with past medical history of hypertension, diabetes, GERD, polysubstance abuse, brain aneurysm who was admitted to ICU on 4/9 with acute metabolic encephalopathy, metabolic acidosis secondary to severe DKA, AKI and cocaine intoxication.  He presented as a code STEMI after being found down by his roommate the night before after having 10 hours of vomiting possibly secondary to heroin abuse the night before.  Glucose on presentation was 1400, CO2 less than 7, potassium 7.4.  Nephrology and cardiology consulted.  Patient being treated for possible aspiration pneumonia with Unasyn.  Acute respiratory failure with hypoxia and hypercapnia -resolved --Patient now DNR/DNI  Possible aspiration pneumonia -completed course of Unasyn and probiotics.  Acute metabolic encephalopathy -resolved.  Secondary to substance abuse and DKA  Acute kidney injury - Nephrology following.  Monitor BMP.  Avoid nephrotoxins and hypotension.  Renally dose meds as indicated  Hyperkalemia - present on admission, secondary to renal failure. Resolved  DKA -resolved   Type 2 diabetes complicated by peripheral neuropathy-uncontrolled, A1c 12.5%.  Insulin regimen titrated with improvement but continued hyperglycemic episodes secondary to patient's dietary noncompliance. --Insulin regimen as below, very close PCP follow  up --patient educated by diabetes coordinator  Abnormal EKG -presented as code STEMI.  Cardiology consulted.  Treated with heparin drip. EKG changes improved once metabolic abnormalities improved and patient free of chest pain.  Similar presentation in October 2020 and cardiac cath at that time showed nonobstructive coronary disease normal EF.  Echo this admission showed EF 60 to 65%, grade 1 diastolic dysfunction.  Chronic diastolic CHF -likely secondary to polysubstance abuse and medical noncompliance.  EF 60 to 65% with grade 1 diastolic dysfunction.  Hypertension: Start amlodipine 5 mg daily.  There are hydralazine. Polysubstance abuse: UDS +cocaine and heroin Tobacco abuse   Patient BMI: Body mass index is 19.55 kg/m.   Discharge Diagnoses: Active Problems:   Respiratory failure [facility])    Discharge Instructions   Discharge Instructions    Call MD for:   Complete by: As directed    Blood sugars dropping below 80 or staying above 250-300   Call MD for:  extreme fatigue   Complete by: As directed    Call MD for:  persistant dizziness or light-headedness   Complete by: As directed    Call MD for:  persistant nausea and vomiting   Complete by: As directed    Call MD for:  temperature >100.4   Complete by: As directed    Diet - low sodium heart healthy   Complete by: As directed    Discharge instructions   Complete by: As directed    Take insulin as prescribed and closely watch your blood sugar.   You need to call your doctor if blood sugar is staying higher than 300 and not controlled.  Take BP medications (amlodipine and losartan) as prescribed.  Uncontrolled BP can lead to strokes, heart failure and other serious health consequences.  Cocaine and  other illicit drugs make BP go very high and are a danger to your health and wellbeing.  Please refrain from using these substances.  Eat a low sodium diet as well to help keep BP controlled.   Increase activity slowly    Complete by: As directed      Allergies as of 02/17/2020   No Known Allergies     Medication List    TAKE these medications   amLODipine 10 MG tablet Commonly known as: NORVASC Take 1 tablet (10 mg total) by mouth daily.   aspirin EC 81 MG tablet Take 1 tablet (81 mg total) by mouth daily.   atorvastatin 40 MG tablet Commonly known as: LIPITOR Take 1 tablet (40 mg total) by mouth daily at 6 PM.   insulin aspart 100 UNIT/ML FlexPen Commonly known as: NOVOLOG Inject 12 Units into the skin 3 (three) times daily with meals. What changed:   how much to take  when to take this   insulin glargine 100 unit/mL Sopn Commonly known as: LANTUS Inject 0.18 mLs (18 Units total) into the skin daily. What changed: when to take this   losartan 50 MG tablet Commonly known as: COZAAR Take 1 tablet (50 mg total) by mouth daily.   pantoprazole 40 MG tablet Commonly known as: PROTONIX Take 1 tablet (40 mg total) by mouth daily.      Follow-up Information    Schedule an appointment as soon as possible for a visit with Mady Haagensen, MD.   Specialty: Nephrology Contact information: [address] [place] [address] [phone]        [facility].   Specialty: Primary Care Contact information: [address] [place] [address] [place] [address] [phone]         No Known Allergies  Consultations:  Cardiology  Nephrology    Procedures/Studies: DG Chest 1 View  Result Date: 02/13/2020 CLINICAL DATA:  Status post intubation. EXAM: CHEST  1 VIEW COMPARISON:  December 15, 2019. FINDINGS: The heart size and mediastinal contours are within normal limits. Endotracheal tube is in grossly good position. Nasogastric tube is seen entering right mainstem bronchus; withdrawal is recommended. No pneumothorax or pleural effusion is noted. Lungs are clear. The visualized skeletal structures are unremarkable. IMPRESSION:  Endotracheal tube in grossly good position. Nasogastric tube is seen entering right mainstem bronchus; withdrawal is recommended. Critical Value/emergent results were called by telephone at the time of interpretation on 02/13/2020 at 4:22 pm to provider Shaune Pollack , who verbally acknowledged these results. Electronically Signed   By: Lupita Raider M.D.   On: 02/13/2020 16:22   DG Abdomen 1 View  Result Date: 02/13/2020 CLINICAL DATA:  NG tube EXAM: ABDOMEN - 1 VIEW COMPARISON:  07/27/2019 FINDINGS: NG tube is in the stomach.  Nonobstructive bowel gas pattern. IMPRESSION: NG tube in the stomach. Electronically Signed   By: Charlett Nose M.D.   On: 02/13/2020 17:11   CT Head Wo Contrast  Result Date: 02/13/2020 CLINICAL DATA:  59 year old male with encephalopathy. EXAM: CT HEAD WITHOUT CONTRAST TECHNIQUE: Contiguous axial images were obtained from the base of the skull through the vertex without intravenous contrast. COMPARISON:  Head CT dated 08/19/2019. FINDINGS: Brain: The ventricles and sulci appropriate size for patient's age. Probable tiny old lacunar infarct in the left lentiform nucleus. The gray-white matter discrimination is preserved. There is no acute intracranial hemorrhage. No mass effect or midline shift. No extra-axial fluid collection. Vascular:  No hyperdense vessel or unexpected calcification. Skull: No acute calvarial pathology. Left frontal and parietal burr holes. Sinuses/Orbits: The visualized paranasal sinuses and mastoid air cells are clear. Cerumen noted in the right external auditory canal. Other: None IMPRESSION: No acute intracranial pathology. Electronically Signed   By: Elgie Collard M.D.   On: 02/13/2020 16:44   US RENAL  Result Date: 02/14/2020 CLINICAL DATA:  Acute renal injury EXAM: RENAL / URINARY TRACT ULTRASOUND COMPLETE COMPARISON:  None. FINDINGS: Right Kidney: Renal measurements: 11.9 x 4.9 x 5.1 cm = volume: 157 mL . Echogenicity within normal limits. No mass or  hydronephrosis visualized. Left Kidney: Renal measurements: 12.1 x 6.3 x 5.7 cm = volume: 230 mL. Echogenicity within normal limits. No mass or hydronephrosis visualized. Bladder: Decompressed with a Foley catheter. Other: None. IMPRESSION: No acute abnormalities are identified. The bladder is poorly evaluated due to decompression with a Foley catheter. Electronically Signed   By: Gerome Sam III M.D   On: 02/14/2020 13:03   DG Chest Port 1 View  Result Date: 02/14/2020 CLINICAL DATA:  NG tube EXAM: PORTABLE CHEST 1 VIEW COMPARISON:  Radiograph 02/13/2020 FINDINGS: Stable cardiac silhouette. Endotracheal tube unchanged. Introduction of NG tube with side port below the GE junction. Lungs are clear.  No pneumothorax.  Anterior cervical fusion noted. IMPRESSION: 1. NG tube with side port below the GE junction in good position. 2. Lungs are clear. Electronically Signed   By: Genevive Bi M.D.   On: 02/14/2020 08:37   ECHOCARDIOGRAM COMPLETE  Result Date: 02/15/2020    ECHOCARDIOGRAM REPORT   Patient Name:   Spencer [name] Date of Exam: 02/15/2020 Medical Rec #:  [id number]    Height:       70.0 in Accession #:    [id number]   Weight:       136.0 lb Date of Birth:  09-26-1961     BSA:          1.772 m Patient Age:    59 years     BP:           116/71 mmHg Patient Gender: M            HR:           77 bpm. Exam Location:  [facility] Procedure: 2D Echo Indications:     ABNORMAL ECG 794.31/ R94.31  History:         Patient has no prior history of Echocardiogram examinations and                  Patient has prior history of Echocardiogram examinations, most                  recent 06/03/2019. Risk Factors:Hypertension, Diabetes and                  Current Smoker.  Sonographer:     Johnathan Hausen Referring Phys:  [id number] Judithe Modest Diagnosing Phys: Debbe Odea MD IMPRESSIONS  1. Left ventricular ejection fraction, by estimation, is 60 to 65%. The left ventricle has normal function. The left ventricle has no  regional wall motion abnormalities. Left ventricular diastolic parameters are consistent with Grade I diastolic dysfunction (impaired relaxation).  2. Right ventricular systolic function is normal. The right ventricular size is normal.  3. The mitral valve is normal in structure. No evidence of mitral valve regurgitation. No evidence of mitral stenosis.  4. The aortic valve is grossly normal. Aortic valve regurgitation is not  visualized. No aortic stenosis is present.  5. Aortic root is Ectatic. There is borderline dilatation of the aortic root measuring 39 mm.  6. The inferior vena cava is normal in size with greater than 50% respiratory variability, suggesting right atrial pressure of 3 mmHg. FINDINGS  Left Ventricle: Left ventricular ejection fraction, by estimation, is 60 to 65%. The left ventricle has normal function. The left ventricle has no regional wall motion abnormalities. The left ventricular internal cavity size was normal in size. There is  no left ventricular hypertrophy. Left ventricular diastolic parameters are consistent with Grade I diastolic dysfunction (impaired relaxation). Right Ventricle: The right ventricular size is normal. No increase in right ventricular wall thickness. Right ventricular systolic function is normal. Left Atrium: Left atrial size was normal in size. Right Atrium: Right atrial size was normal in size. Pericardium: There is no evidence of pericardial effusion. Mitral Valve: The mitral valve is normal in structure. Normal mobility of the mitral valve leaflets. No evidence of mitral valve regurgitation. No evidence of mitral valve stenosis. Tricuspid Valve: The tricuspid valve is normal in structure. Tricuspid valve regurgitation is trivial. No evidence of tricuspid stenosis. Aortic Valve: The aortic valve is grossly normal. Aortic valve regurgitation is not visualized. No aortic stenosis is present. Pulmonic Valve: The pulmonic valve was normal in structure. Pulmonic valve  regurgitation is trivial. No evidence of pulmonic stenosis. Aorta: Root is Ectatic. There is borderline dilatation of the aortic root measuring 39 mm. Venous: The inferior vena cava is normal in size with greater than 50% respiratory variability, suggesting right atrial pressure of 3 mmHg. IAS/Shunts: No atrial level shunt detected by color flow Doppler.  LEFT VENTRICLE PLAX 2D LVIDd:         4.72 cm  Diastology LVIDs:         3.23 cm  LV e' lateral:   8.81 cm/s LV PW:         1.39 cm  LV E/e' lateral: 8.2 LV IVS:        1.17 cm  LV e' medial:    5.87 cm/s LVOT diam:     2.30 cm  LV E/e' medial:  12.3 LVOT Area:     4.15 cm  RIGHT VENTRICLE RV Mid diam:    4.24 cm LEFT ATRIUM             Index LA diam:        3.90 cm 2.20 cm/m LA Vol (A2C):   46.9 ml 26.47 ml/m LA Vol (A4C):   36.4 ml 20.54 ml/m LA Biplane Vol: 42.5 ml 23.99 ml/m   AORTA Ao Root diam: 3.90 cm MITRAL VALVE MV Area (PHT): 3.05 cm    SHUNTS MV Decel Time: 249 msec    Systemic Diam: 2.30 cm MV E velocity: 72.20 cm/s MV A velocity: 91.90 cm/s MV E/A ratio:  0.79 Debbe Odea MD Electronically signed by Debbe Odea MD Signature Date/Time: 02/15/2020/2:07:36 PM    Final         Subjective: Patient seen this AM, no acute events overnight.  Reports someone brought him fast food yesterday for lunch.  He denies chest pain, SOB, cough, fever, chills or other complaints.     Discharge Exam: Vitals:   02/17/20 1200 02/17/20 1441  BP: (!) 142/92 135/90  Pulse:  81  Resp: 20 18  Temp:  97.8 F (36.6 C)  SpO2:     Vitals:   02/17/20 0559 02/17/20 0800 02/17/20 1200 02/17/20 1441  BP:  Marland Kitchen)  179/116 (!) 142/92 135/90  Pulse:    81  Resp: 19 14 20 18   Temp:  (!) 97.5 F (36.4 C)  97.8 F (36.6 C)  TempSrc:  Oral  Oral  SpO2:  100%    Weight:      Height:        General: Pt is alert, awake, not in acute distress, chronically ill appearing Cardiovascular: RRR, S1/S2 +, no rubs, no gallops Respiratory: decreased breath  sounds, no wheezing, no rhonchi Abdominal: Soft, NT, ND, bowel sounds + Extremities: no edema, no cyanosis    The results of significant diagnostics from this hospitalization (including imaging, microbiology, ancillary and laboratory) are listed below for reference.     Microbiology: Recent Results (from the past 240 hour(s))  Blood culture (routine x 2)     Status: None   Collection Time: 02/13/20  4:04 PM   Specimen: BLOOD  Result Value Ref Range Status   Specimen Description BLOOD LEFT ANTECUBITAL  Final   Special Requests   Final    BOTTLES DRAWN AEROBIC AND ANAEROBIC Blood Culture adequate volume   Culture   Final    NO GROWTH 5 DAYS Performed at [facility], [address] [place], [place] [address]    Report Status 02/18/2020 FINAL  Final  Blood culture (routine x 2)     Status: None   Collection Time: 02/13/20  4:09 PM   Specimen: BLOOD  Result Value Ref Range Status   Specimen Description BLOOD RIGHT ANTECUBITAL  Final   Special Requests   Final    BOTTLES DRAWN AEROBIC AND ANAEROBIC Blood Culture adequate volume   Culture   Final    NO GROWTH 5 DAYS Performed at [facility], [address], [place], [place] [address]    Report Status 02/19/2020 FINAL  Final  Respiratory Panel by RT PCR (Flu A&B, Covid) - Nasopharyngeal Swab     Status: None   Collection Time: 02/13/20  5:29 PM   Specimen: Nasopharyngeal Swab  Result Value Ref Range Status   SARS Coronavirus 2 by RT PCR NEGATIVE NEGATIVE Final    Comment: (NOTE) SARS-CoV-2 target nucleic acids are NOT DETECTED. The SARS-CoV-2 RNA is generally detectable in upper respiratoy specimens during the acute phase of infection. The lowest concentration of SARS-CoV-2 viral copies this assay can detect is 131 copies/mL. A negative result does not preclude SARS-Cov-2 infection and should not be used as the sole basis for treatment or other patient management decisions. A negative result may occur  with  improper specimen collection/handling, submission of specimen other than nasopharyngeal swab, presence of viral mutation(s) within the areas targeted by this assay, and inadequate number of viral copies (<131 copies/mL). A negative result must be combined with clinical observations, patient history, and epidemiological information. The expected result is Negative. Fact Sheet for Patients:  PinkCheek.be Fact Sheet for Healthcare Providers:  GravelBags.it This test is not yet ap proved or cleared by the [place] FDA and  has been authorized for detection and/or diagnosis of SARS-CoV-2 by FDA under an Emergency Use Authorization (EUA). This EUA will remain  in effect (meaning this test can be used) for the duration of the COVID-19 declaration under Section 564(b)(1) of the Act, 21 U.S.C. section 360bbb-3(b)(1), unless the authorization is terminated or revoked sooner.    Influenza A by PCR NEGATIVE NEGATIVE Final   Influenza B by PCR NEGATIVE NEGATIVE Final    Comment: (NOTE) The Xpert Xpress SARS-CoV-2/FLU/RSV assay is intended  as an aid in  the diagnosis of influenza from Nasopharyngeal swab specimens and  should not be used as a sole basis for treatment. Nasal washings and  aspirates are unacceptable for Xpert Xpress SARS-CoV-2/FLU/RSV  testing. Fact Sheet for Patients: https://www.moore.com/ Fact Sheet for Healthcare Providers: https://www.young.biz/ This test is not yet approved or cleared by the [place] FDA and  has been authorized for detection and/or diagnosis of SARS-CoV-2 by  FDA under an Emergency Use Authorization (EUA). This EUA will remain  in effect (meaning this test can be used) for the duration of the  Covid-19 declaration under Section 564(b)(1) of the Act, 21  U.S.C. section 360bbb-3(b)(1), unless the authorization is  terminated or revoked. Performed  at [facility], [address] [place], [place] [address]   MRSA PCR Screening     Status: None   Collection Time: 02/13/20  6:52 PM   Specimen: Nasopharyngeal  Result Value Ref Range Status   MRSA by PCR NEGATIVE NEGATIVE Final    Comment:        The GeneXpert MRSA Assay (FDA approved for NASAL specimens only), is one component of a comprehensive MRSA colonization surveillance program. It is not intended to diagnose MRSA infection nor to guide or monitor treatment for MRSA infections. Performed at [facility], [address] [place], [place] [address]   Culture, respiratory (non-expectorated)     Status: None   Collection Time: 02/14/20 11:18 AM   Specimen: Tracheal Aspirate; Respiratory  Result Value Ref Range Status   Specimen Description   Final    TRACHEAL ASPIRATE Performed at [facility], [address], [place], [place] [address]    Special Requests   Final    Normal Performed at [facility], [address] [place], [place] [address]    Gram Stain   Final    RARE WBC PRESENT, PREDOMINANTLY MONONUCLEAR FEW BUDDING YEAST SEEN Performed at [facility], [address] [place], [place] [address]    Culture MODERATE CANDIDA ALBICANS  Final   Report Status 02/17/2020 FINAL  Final     Labs: BNP (last 3 results) Recent Labs    07/27/19 1741 08/06/19 2252  BNP 122.0* 101.0*   Basic Metabolic Panel: Recent Labs  Lab 02/17/20 0637  NA 140  K 3.5  CL 108  CO2 25  GLUCOSE 184*  BUN 23  CREATININE 0.84  CALCIUM 9.2  MG 2.1   Liver Function Tests: No results for input(s): AST, ALT, ALKPHOS, BILITOT, PROT, ALBUMIN in the last 168 hours. No results for input(s): LIPASE, AMYLASE in the last 168 hours. No results for input(s): AMMONIA in the last 168 hours. CBC: Recent Labs  Lab 02/17/20 0637  WBC 5.8  NEUTROABS 3.8  HGB 13.8  HCT 40.9  MCV 92.1  PLT 145*   Cardiac Enzymes: No results for  input(s): CKTOTAL, CKMB, CKMBINDEX, TROPONINI in the last 168 hours. BNP: Invalid input(s): POCBNP CBG: Recent Labs  Lab 02/16/20 2100 02/17/20 0033 02/17/20 0423 02/17/20 0729 02/17/20 1141  GLUCAP 412* 295* 377* 153* 362*   D-Dimer No results for input(s): DDIMER in the last 72 hours. Hgb A1c No results for input(s): HGBA1C in the last 72 hours. Lipid Profile No results for input(s): CHOL, HDL, LDLCALC, TRIG, CHOLHDL, LDLDIRECT in the last 72 hours. Thyroid function studies No results for input(s): TSH, T4TOTAL, T3FREE, THYROIDAB in the last 72 hours.  Invalid input(s): FREET3 Anemia work up No results for input(s): VITAMINB12,  FOLATE, FERRITIN, TIBC, IRON, RETICCTPCT in the last 72 hours. Urinalysis    Component Value Date/Time   COLORURINE YELLOW (A) 02/13/2020 1535   APPEARANCEUR CLEAR (A) 02/13/2020 1535   LABSPEC 1.020 02/13/2020 1535   PHURINE 5.0 02/13/2020 1535   GLUCOSEU >=500 (A) 02/13/2020 1535   HGBUR SMALL (A) 02/13/2020 1535   BILIRUBINUR NEGATIVE 02/13/2020 1535   KETONESUR 20 (A) 02/13/2020 1535   PROTEINUR 30 (A) 02/13/2020 1535   NITRITE NEGATIVE 02/13/2020 1535   LEUKOCYTESUR NEGATIVE 02/13/2020 1535   Sepsis Labs Invalid input(s): PROCALCITONIN,  WBC,  LACTICIDVEN Microbiology Recent Results (from the past 240 hour(s))  Blood culture (routine x 2)     Status: None   Collection Time: 02/13/20  4:04 PM   Specimen: BLOOD  Result Value Ref Range Status   Specimen Description BLOOD LEFT ANTECUBITAL  Final   Special Requests   Final    BOTTLES DRAWN AEROBIC AND ANAEROBIC Blood Culture adequate volume   Culture   Final    NO GROWTH 5 DAYS Performed at [facility], [address] [place], [place] [address]    Report Status 02/18/2020 FINAL  Final  Blood culture (routine x 2)     Status: None   Collection Time: 02/13/20  4:09 PM   Specimen: BLOOD  Result Value Ref Range Status   Specimen Description BLOOD RIGHT ANTECUBITAL  Final    Special Requests   Final    BOTTLES DRAWN AEROBIC AND ANAEROBIC Blood Culture adequate volume   Culture   Final    NO GROWTH 5 DAYS Performed at [facility], [address] [place], [place] [address]    Report Status 02/19/2020 FINAL  Final  Respiratory Panel by RT PCR (Flu A&B, Covid) - Nasopharyngeal Swab     Status: None   Collection Time: 02/13/20  5:29 PM   Specimen: Nasopharyngeal Swab  Result Value Ref Range Status   SARS Coronavirus 2 by RT PCR NEGATIVE NEGATIVE Final    Comment: (NOTE) SARS-CoV-2 target nucleic acids are NOT DETECTED. The SARS-CoV-2 RNA is generally detectable in upper respiratoy specimens during the acute phase of infection. The lowest concentration of SARS-CoV-2 viral copies this assay can detect is 131 copies/mL. A negative result does not preclude SARS-Cov-2 infection and should not be used as the sole basis for treatment or other patient management decisions. A negative result may occur with  improper specimen collection/handling, submission of specimen other than nasopharyngeal swab, presence of viral mutation(s) within the areas targeted by this assay, and inadequate number of viral copies (<131 copies/mL). A negative result must be combined with clinical observations, patient history, and epidemiological information. The expected result is Negative. Fact Sheet for Patients:  https://www.moore.com/https://www.fda.gov/media/142436/download Fact Sheet for Healthcare Providers:  https://www.young.biz/https://www.fda.gov/media/142435/download This test is not yet ap proved or cleared by the [place] FDA and  has been authorized for detection and/or diagnosis of SARS-CoV-2 by FDA under an Emergency Use Authorization (EUA). This EUA will remain  in effect (meaning this test can be used) for the duration of the COVID-19 declaration under Section 564(b)(1) of the Act, 21 U.S.C. section 360bbb-3(b)(1), unless the authorization is terminated or revoked sooner.    Influenza A by PCR  NEGATIVE NEGATIVE Final   Influenza B by PCR NEGATIVE NEGATIVE Final    Comment: (NOTE) The Xpert Xpress SARS-CoV-2/FLU/RSV assay is intended as an aid in  the diagnosis of influenza from Nasopharyngeal swab specimens and  should not be used as a sole  basis for treatment. Nasal washings and  aspirates are unacceptable for Xpert Xpress SARS-CoV-2/FLU/RSV  testing. Fact Sheet for Patients: https://www.moore.com/ Fact Sheet for Healthcare Providers: https://www.young.biz/ This test is not yet approved or cleared by the [place] FDA and  has been authorized for detection and/or diagnosis of SARS-CoV-2 by  FDA under an Emergency Use Authorization (EUA). This EUA will remain  in effect (meaning this test can be used) for the duration of the  Covid-19 declaration under Section 564(b)(1) of the Act, 21  U.S.C. section 360bbb-3(b)(1), unless the authorization is  terminated or revoked. Performed at [facility], [address] [place], [place] [address]   MRSA PCR Screening     Status: None   Collection Time: 02/13/20  6:52 PM   Specimen: Nasopharyngeal  Result Value Ref Range Status   MRSA by PCR NEGATIVE NEGATIVE Final    Comment:        The GeneXpert MRSA Assay (FDA approved for NASAL specimens only), is one component of a comprehensive MRSA colonization surveillance program. It is not intended to diagnose MRSA infection nor to guide or monitor treatment for MRSA infections. Performed at [facility], [address] [place], [place] [address]   Culture, respiratory (non-expectorated)     Status: None   Collection Time: 02/14/20 11:18 AM   Specimen: Tracheal Aspirate; Respiratory  Result Value Ref Range Status   Specimen Description   Final    TRACHEAL ASPIRATE Performed at [facility], [address] [place], [place] [address]    Special Requests   Final    Normal Performed at [facility], [address] [place], [place] [address]    Gram Stain   Final    RARE WBC PRESENT, PREDOMINANTLY MONONUCLEAR FEW BUDDING YEAST SEEN Performed at [facility], [address], [place], [place] [address]    Culture MODERATE CANDIDA ALBICANS  Final   Report Status 02/17/2020 FINAL  Final     Time coordinating discharge: Over 30 minutes  SIGNED:   Pennie Banter, DO [facility] 02/23/2020, 10:47 AM   If 7PM-7AM, please contact night-coverage [email or web address]

## 2020-02-18 LAB — CULTURE, BLOOD (ROUTINE X 2)
Culture: NO GROWTH
Special Requests: ADEQUATE

## 2020-02-19 LAB — CULTURE, BLOOD (ROUTINE X 2)
Culture: NO GROWTH
Special Requests: ADEQUATE

## 2020-02-19 LAB — STREP PNEUMONIAE URINARY ANTIGEN: Strep Pneumo Urinary Antigen: NEGATIVE

## 2020-03-03 ENCOUNTER — Emergency Department: Payer: Medicaid Other

## 2020-03-03 ENCOUNTER — Other Ambulatory Visit: Payer: Self-pay

## 2020-03-03 ENCOUNTER — Inpatient Hospital Stay
Admission: EM | Admit: 2020-03-03 | Discharge: 2020-03-09 | DRG: 637 | Discharge status: Against Medical Advice | Payer: Medicaid Other | Attending: Internal Medicine | Admitting: Internal Medicine

## 2020-03-03 DIAGNOSIS — Z20822 Contact with and (suspected) exposure to covid-19: Secondary | ICD-10-CM | POA: Diagnosis present

## 2020-03-03 DIAGNOSIS — Z5329 Procedure and treatment not carried out because of patient's decision for other reasons: Secondary | ICD-10-CM | POA: Diagnosis not present

## 2020-03-03 DIAGNOSIS — E1110 Type 2 diabetes mellitus with ketoacidosis without coma: Secondary | ICD-10-CM | POA: Diagnosis present

## 2020-03-03 DIAGNOSIS — I10 Essential (primary) hypertension: Secondary | ICD-10-CM | POA: Diagnosis present

## 2020-03-03 DIAGNOSIS — L89153 Pressure ulcer of sacral region, stage 3: Secondary | ICD-10-CM | POA: Diagnosis present

## 2020-03-03 DIAGNOSIS — Y93E9 Activity, other interior property and clothing maintenance: Secondary | ICD-10-CM | POA: Diagnosis not present

## 2020-03-03 DIAGNOSIS — T465X6A Underdosing of other antihypertensive drugs, initial encounter: Secondary | ICD-10-CM | POA: Diagnosis present

## 2020-03-03 DIAGNOSIS — E871 Hypo-osmolality and hyponatremia: Secondary | ICD-10-CM | POA: Diagnosis present

## 2020-03-03 DIAGNOSIS — T383X6A Underdosing of insulin and oral hypoglycemic [antidiabetic] drugs, initial encounter: Secondary | ICD-10-CM | POA: Diagnosis present

## 2020-03-03 DIAGNOSIS — Z91138 Patient's unintentional underdosing of medication regimen for other reason: Secondary | ICD-10-CM | POA: Diagnosis not present

## 2020-03-03 DIAGNOSIS — A4101 Sepsis due to Methicillin susceptible Staphylococcus aureus: Secondary | ICD-10-CM | POA: Diagnosis not present

## 2020-03-03 DIAGNOSIS — S12300A Unspecified displaced fracture of fourth cervical vertebra, initial encounter for closed fracture: Secondary | ICD-10-CM | POA: Clinically undetermined

## 2020-03-03 DIAGNOSIS — R509 Fever, unspecified: Secondary | ICD-10-CM

## 2020-03-03 DIAGNOSIS — K219 Gastro-esophageal reflux disease without esophagitis: Secondary | ICD-10-CM | POA: Diagnosis present

## 2020-03-03 DIAGNOSIS — S12300D Unspecified displaced fracture of fourth cervical vertebra, subsequent encounter for fracture with routine healing: Secondary | ICD-10-CM | POA: Diagnosis not present

## 2020-03-03 DIAGNOSIS — F17200 Nicotine dependence, unspecified, uncomplicated: Secondary | ICD-10-CM | POA: Diagnosis present

## 2020-03-03 DIAGNOSIS — N179 Acute kidney failure, unspecified: Secondary | ICD-10-CM | POA: Diagnosis present

## 2020-03-03 DIAGNOSIS — R0902 Hypoxemia: Secondary | ICD-10-CM | POA: Diagnosis not present

## 2020-03-03 DIAGNOSIS — T461X6A Underdosing of calcium-channel blockers, initial encounter: Secondary | ICD-10-CM | POA: Diagnosis present

## 2020-03-03 DIAGNOSIS — Y92009 Unspecified place in unspecified non-institutional (private) residence as the place of occurrence of the external cause: Secondary | ICD-10-CM | POA: Diagnosis not present

## 2020-03-03 DIAGNOSIS — W19XXXA Unspecified fall, initial encounter: Secondary | ICD-10-CM

## 2020-03-03 DIAGNOSIS — W1789XA Other fall from one level to another, initial encounter: Secondary | ICD-10-CM | POA: Diagnosis present

## 2020-03-03 DIAGNOSIS — M25512 Pain in left shoulder: Secondary | ICD-10-CM | POA: Diagnosis present

## 2020-03-03 DIAGNOSIS — L89012 Pressure ulcer of right elbow, stage 2: Secondary | ICD-10-CM | POA: Diagnosis present

## 2020-03-03 DIAGNOSIS — S12390A Other displaced fracture of fourth cervical vertebra, initial encounter for closed fracture: Secondary | ICD-10-CM | POA: Diagnosis present

## 2020-03-03 DIAGNOSIS — Z8249 Family history of ischemic heart disease and other diseases of the circulatory system: Secondary | ICD-10-CM

## 2020-03-03 DIAGNOSIS — L8990 Pressure ulcer of unspecified site, unspecified stage: Secondary | ICD-10-CM | POA: Insufficient documentation

## 2020-03-03 DIAGNOSIS — E1310 Other specified diabetes mellitus with ketoacidosis without coma: Secondary | ICD-10-CM

## 2020-03-03 DIAGNOSIS — M25551 Pain in right hip: Secondary | ICD-10-CM | POA: Diagnosis present

## 2020-03-03 DIAGNOSIS — F1410 Cocaine abuse, uncomplicated: Secondary | ICD-10-CM | POA: Diagnosis present

## 2020-03-03 DIAGNOSIS — T471X6A Underdosing of other antacids and anti-gastric-secretion drugs, initial encounter: Secondary | ICD-10-CM | POA: Diagnosis present

## 2020-03-03 DIAGNOSIS — M2578 Osteophyte, vertebrae: Secondary | ICD-10-CM | POA: Diagnosis present

## 2020-03-03 DIAGNOSIS — W19XXXD Unspecified fall, subsequent encounter: Secondary | ICD-10-CM | POA: Diagnosis not present

## 2020-03-03 DIAGNOSIS — B9561 Methicillin susceptible Staphylococcus aureus infection as the cause of diseases classified elsewhere: Secondary | ICD-10-CM | POA: Diagnosis not present

## 2020-03-03 DIAGNOSIS — Y92012 Bathroom of single-family (private) house as the place of occurrence of the external cause: Secondary | ICD-10-CM

## 2020-03-03 DIAGNOSIS — Z7982 Long term (current) use of aspirin: Secondary | ICD-10-CM | POA: Diagnosis not present

## 2020-03-03 DIAGNOSIS — R7881 Bacteremia: Secondary | ICD-10-CM | POA: Diagnosis not present

## 2020-03-03 LAB — COMPREHENSIVE METABOLIC PANEL
ALT: 20 U/L (ref 0–44)
AST: 15 U/L (ref 15–41)
Albumin: 3.7 g/dL (ref 3.5–5.0)
Alkaline Phosphatase: 86 U/L (ref 38–126)
Anion gap: 22 — ABNORMAL HIGH (ref 5–15)
BUN: 23 mg/dL (ref 8–23)
CO2: 11 mmol/L — ABNORMAL LOW (ref 22–32)
Calcium: 9.4 mg/dL (ref 8.9–10.3)
Chloride: 104 mmol/L (ref 98–111)
Creatinine, Ser: 1.42 mg/dL — ABNORMAL HIGH (ref 0.61–1.24)
GFR calc Af Amer: >60 mL/min (ref >60)
GFR calc non Af Amer: 53 mL/min — ABNORMAL LOW (ref >60)
Glucose, Bld: 428 mg/dL — ABNORMAL HIGH (ref 70–99)
Potassium: 5 mmol/L (ref 3.5–5.1)
Sodium: 137 mmol/L (ref 135–145)
Total Bilirubin: 2.3 mg/dL — ABNORMAL HIGH (ref 0.3–1.2)
Total Protein: 7.6 g/dL (ref 6.5–8.1)

## 2020-03-03 LAB — URINALYSIS, COMPLETE (UACMP) WITH MICROSCOPIC
Bacteria, UA: NONE SEEN
Bilirubin Urine: NEGATIVE
Glucose, UA: >=500 mg/dL — AB
Hgb urine dipstick: NEGATIVE
Ketones, ur: 80 mg/dL — AB
Leukocytes,Ua: NEGATIVE
Nitrite: NEGATIVE
Protein, ur: 30 mg/dL — AB
Specific Gravity, Urine: 1.026 (ref 1.005–1.030)
Squamous Epithelial / HPF: NONE SEEN (ref 0–5)
WBC, UA: NONE SEEN WBC/hpf (ref 0–5)
pH: 5 (ref 5.0–8.0)

## 2020-03-03 LAB — BLOOD GAS, VENOUS
Acid-base deficit: 16.5 mmol/L — ABNORMAL HIGH (ref 0.0–2.0)
Bicarbonate: 9.8 mmol/L — ABNORMAL LOW (ref 20.0–28.0)
O2 Saturation: 77.9 %
Patient temperature: 37
pCO2, Ven: 25 mmHg — ABNORMAL LOW (ref 44.0–60.0)
pH, Ven: 7.2 — ABNORMAL LOW (ref 7.250–7.430)
pO2, Ven: 53 mmHg — ABNORMAL HIGH (ref 32.0–45.0)

## 2020-03-03 LAB — BASIC METABOLIC PANEL
Anion gap: 16 — ABNORMAL HIGH (ref 5–15)
BUN: 22 mg/dL (ref 8–23)
CO2: 13 mmol/L — ABNORMAL LOW (ref 22–32)
Calcium: 8.8 mg/dL — ABNORMAL LOW (ref 8.9–10.3)
Chloride: 103 mmol/L (ref 98–111)
Creatinine, Ser: 1.11 mg/dL (ref 0.61–1.24)
GFR calc Af Amer: >60 mL/min (ref >60)
GFR calc non Af Amer: >60 mL/min (ref >60)
Glucose, Bld: 212 mg/dL — ABNORMAL HIGH (ref 70–99)
Potassium: 4.9 mmol/L (ref 3.5–5.1)
Sodium: 132 mmol/L — ABNORMAL LOW (ref 135–145)

## 2020-03-03 LAB — GLUCOSE, CAPILLARY
Glucose-Capillary: 123 mg/dL — ABNORMAL HIGH (ref 70–99)
Glucose-Capillary: 134 mg/dL — ABNORMAL HIGH (ref 70–99)
Glucose-Capillary: 252 mg/dL — ABNORMAL HIGH (ref 70–99)
Glucose-Capillary: 284 mg/dL — ABNORMAL HIGH (ref 70–99)
Glucose-Capillary: 372 mg/dL — ABNORMAL HIGH (ref 70–99)
Glucose-Capillary: 376 mg/dL — ABNORMAL HIGH (ref 70–99)

## 2020-03-03 LAB — CBC
HCT: 44.8 % (ref 39.0–52.0)
Hemoglobin: 14.5 g/dL (ref 13.0–17.0)
MCH: 30.7 pg (ref 26.0–34.0)
MCHC: 32.4 g/dL (ref 30.0–36.0)
MCV: 94.9 fL (ref 80.0–100.0)
Platelets: 278 10*3/uL (ref 150–400)
RBC: 4.72 MIL/uL (ref 4.22–5.81)
RDW: 14.1 % (ref 11.5–15.5)
WBC: 14.2 10*3/uL — ABNORMAL HIGH (ref 4.0–10.5)
nRBC: 0 % (ref 0.0–0.2)

## 2020-03-03 LAB — URINE DRUG SCREEN, QUALITATIVE (ARMC ONLY)
Amphetamines, Ur Screen: NOT DETECTED
Barbiturates, Ur Screen: NOT DETECTED
Benzodiazepine, Ur Scrn: NOT DETECTED
Cannabinoid 50 Ng, Ur ~~LOC~~: NOT DETECTED
Cocaine Metabolite,Ur ~~LOC~~: POSITIVE — AB
MDMA (Ecstasy)Ur Screen: NOT DETECTED
Methadone Scn, Ur: NOT DETECTED
Opiate, Ur Screen: NOT DETECTED
Phencyclidine (PCP) Ur S: NOT DETECTED
Tricyclic, Ur Screen: NOT DETECTED

## 2020-03-03 LAB — MAGNESIUM: Magnesium: 2.1 mg/dL (ref 1.7–2.4)

## 2020-03-03 LAB — CK: Total CK: 77 U/L (ref 49–397)

## 2020-03-03 LAB — RESPIRATORY PANEL BY RT PCR (FLU A&B, COVID)
Influenza A by PCR: NEGATIVE
Influenza B by PCR: NEGATIVE
SARS Coronavirus 2 by RT PCR: NEGATIVE

## 2020-03-03 MED ORDER — ONDANSETRON HCL 4 MG/2ML IJ SOLN
4.0000 mg | Freq: Once | INTRAMUSCULAR | Status: AC
  Administered 2020-03-03: 4 mg via INTRAVENOUS
  Filled 2020-03-03: qty 2

## 2020-03-03 MED ORDER — POTASSIUM CHLORIDE 10 MEQ/100ML IV SOLN
10.0000 meq | INTRAVENOUS | Status: AC
  Administered 2020-03-03 (×2): 10 meq via INTRAVENOUS
  Filled 2020-03-03 (×2): qty 100

## 2020-03-03 MED ORDER — ATORVASTATIN CALCIUM 20 MG PO TABS
40.0000 mg | ORAL_TABLET | Freq: Every day | ORAL | Status: DC
  Administered 2020-03-04 – 2020-03-09 (×5): 40 mg via ORAL
  Filled 2020-03-03 (×6): qty 2

## 2020-03-03 MED ORDER — INSULIN REGULAR(HUMAN) IN NACL 100-0.9 UT/100ML-% IV SOLN
INTRAVENOUS | Status: DC
  Administered 2020-03-03: 10.5 [IU]/h via INTRAVENOUS
  Administered 2020-03-04: 8 [IU]/h via INTRAVENOUS
  Filled 2020-03-03 (×2): qty 100

## 2020-03-03 MED ORDER — DEXTROSE-NACL 5-0.45 % IV SOLN: INTRAVENOUS | Status: DC

## 2020-03-03 MED ORDER — SODIUM CHLORIDE 0.9 % IV SOLN: INTRAVENOUS | Status: DC

## 2020-03-03 MED ORDER — ASPIRIN EC 81 MG PO TBEC
81.0000 mg | DELAYED_RELEASE_TABLET | Freq: Every day | ORAL | Status: DC
  Administered 2020-03-04 – 2020-03-09 (×6): 81 mg via ORAL
  Filled 2020-03-03 (×6): qty 1

## 2020-03-03 MED ORDER — SODIUM CHLORIDE 0.9 % IV BOLUS
1000.0000 mL | Freq: Once | INTRAVENOUS | Status: AC
  Administered 2020-03-03: 1000 mL via INTRAVENOUS

## 2020-03-03 MED ORDER — ENOXAPARIN SODIUM 40 MG/0.4ML ~~LOC~~ SOLN
40.0000 mg | SUBCUTANEOUS | Status: DC
  Administered 2020-03-03 – 2020-03-04 (×2): 40 mg via SUBCUTANEOUS
  Filled 2020-03-03 (×2): qty 0.4

## 2020-03-03 MED ORDER — FENTANYL CITRATE (PF) 100 MCG/2ML IJ SOLN
50.0000 ug | Freq: Once | INTRAMUSCULAR | Status: AC
  Administered 2020-03-03: 50 ug via INTRAVENOUS
  Filled 2020-03-03: qty 2

## 2020-03-03 MED ORDER — ONDANSETRON HCL 4 MG/2ML IJ SOLN: 4.0000 mg | Freq: Four times a day (QID) | INTRAMUSCULAR | Status: DC | PRN

## 2020-03-03 MED ORDER — AMLODIPINE BESYLATE 10 MG PO TABS
10.0000 mg | ORAL_TABLET | Freq: Every day | ORAL | Status: DC
  Administered 2020-03-04 – 2020-03-09 (×6): 10 mg via ORAL
  Filled 2020-03-03 (×6): qty 1

## 2020-03-03 MED ORDER — ACETAMINOPHEN 650 MG RE SUPP
650.0000 mg | Freq: Four times a day (QID) | RECTAL | Status: DC | PRN
  Administered 2020-03-07: 650 mg via RECTAL
  Filled 2020-03-03: qty 1

## 2020-03-03 MED ORDER — ACETAMINOPHEN 325 MG PO TABS
650.0000 mg | ORAL_TABLET | Freq: Four times a day (QID) | ORAL | Status: DC | PRN
  Administered 2020-03-03 – 2020-03-04 (×3): 650 mg via ORAL
  Filled 2020-03-03 (×3): qty 2

## 2020-03-03 MED ORDER — PANTOPRAZOLE SODIUM 40 MG PO TBEC
40.0000 mg | DELAYED_RELEASE_TABLET | Freq: Every day | ORAL | Status: DC
  Administered 2020-03-04 – 2020-03-09 (×6): 40 mg via ORAL
  Filled 2020-03-03 (×6): qty 1

## 2020-03-03 MED ORDER — ONDANSETRON HCL 4 MG PO TABS: 4.0000 mg | ORAL_TABLET | Freq: Four times a day (QID) | ORAL | Status: DC | PRN

## 2020-03-03 MED ORDER — DEXTROSE 50 % IV SOLN: 0.0000 mL | INTRAVENOUS | Status: DC | PRN

## 2020-03-03 NOTE — ED Notes (Signed)
Pt transported to CT ?

## 2020-03-03 NOTE — ED Notes (Signed)
This RN attempted to call report.  

## 2020-03-03 NOTE — ED Notes (Signed)
Pt A/Ox4 and provided with water per pt's request.

## 2020-03-03 NOTE — ED Triage Notes (Signed)
Pt to ED via [facility] from home. Per EMS pt fell 3 days ago trying to fix something in the shower. Pt stating he initially did not want to call EMS. Pt stating he has been laying in the floor x3 days and roommate called EMS. Pt c/o right hip pain, CP and SOB. Pt is a diabetic and has not received insulin or other medications x3 days. EMS CBG 361. Pt A&Ox3. P denies ETOH or drug use.

## 2020-03-03 NOTE — H&P (Addendum)
History and Physical:    Spencer [name]   KKX:[id number] DOB: 02-27-1961 DOA: 03/03/2020  Referring MD/provider: Harvest Dark, MD PCP: Patient, No Pcp Per   Patient coming from: Home  Chief Complaint: s/p fall  History of Present Illness:   Spencer [name] is an 59 y.o. male medical history significant for type 2 diabetes mellitus, cocaine abuse, brain aneurysm, hypertension, GERD, recent discharge from the hospital on 02/17/2020 after hospitalization for DKA, AKI, cocaine intoxication, metabolic encephalopathy and respiratory failure requiring intubation.  He presented to the hospital after a fall at home. He said he fell 2 days ago when he was trying to place a new showerhead in his bathroom. He stood on the edge of the bathtub but unfortunately he slipped and fell while attempting to place a new showerhead. He was unable to get up after he fell. He laid on the floor for 2 days. He said he could not eat nor drink and he could not take any of his medicines including insulin. He said he has a roommate but the roommate could not help him. He said his roommate cannot even help himself. He complains of pain in his right hip that developed after the fall. He also complains of thirst, hunger, dry throat and generalized weakness.  ED Course:  The patient had fairly stable vital signs in the ED. However, he was found to have DKA glucose was 428, creatinine 1.42, bicarb 11, anion gap 22 and pH 7.2 on venous blood gas.  ROS:   ROS All other systems reviewed were negative  Past Medical History:   Past Medical History:  Diagnosis Date  . Brain aneurysm   . Broken bones    clavical, ankle, arms toes wriast   . Diabetes mellitus without complication ([place])   . Dysrhythmia   . GERD (gastroesophageal reflux disease)   . Hypertension   . Substance abuse [facility])     Past Surgical History:   Past Surgical History:  Procedure Laterality Date  . CORONARY/GRAFT ACUTE MI REVASCULARIZATION N/A  09/04/2019   Procedure: Coronary/Graft Acute MI Revascularization;  Surgeon: Yolonda Kida, MD;  Location: [facility];  Service: Cardiovascular;  Laterality: N/A;  . ESOPHAGOGASTRODUODENOSCOPY (EGD) WITH PROPOFOL N/A 06/04/2019   Procedure: ESOPHAGOGASTRODUODENOSCOPY (EGD) WITH PROPOFOL;  Surgeon: Toledo, Benay Pike, MD;  Location: [facility];  Service: Gastroenterology;  Laterality: N/A;  . HERNIA REPAIR    . IMPLANTATION / PLACEMENT OF STRIP ELECTRODES VIA BURR HOLES SUBDURAL     anyrusum  . LEFT HEART CATH AND CORONARY ANGIOGRAPHY N/A 09/04/2019   Procedure: LEFT HEART CATH AND CORONARY ANGIOGRAPHY;  Surgeon: Yolonda Kida, MD;  Location: [facility];  Service: Cardiovascular;  Laterality: N/A;  . NECK SURGERY    . TEE WITHOUT CARDIOVERSION N/A 06/03/2019   Procedure: TRANSESOPHAGEAL ECHOCARDIOGRAM (TEE);  Surgeon: Teodoro Spray, MD;  Location: [facility];  Service: Cardiovascular;  Laterality: N/A;    Social History:   Social History   Socioeconomic History  . Marital status: Single    Spouse name: Not on file  . Number of children: Not on file  . Years of education: Not on file  . Highest education level: Not on file  Occupational History  . Not on file  Tobacco Use  . Smoking status: Current Some Day Smoker  . Smokeless tobacco: Never Used  Substance and Sexual Activity  . Alcohol use: Yes    Alcohol/week: 1.0 standard drinks    Types:  1 Cans of beer per week  . Drug use: Not Currently    Types: Cocaine  . Sexual activity: Not on file  Other Topics Concern  . Not on file  Social History Narrative   ** Merged History Encounter **       Social Determinants of Health   Financial Resource Strain:   . Difficulty of Paying Living Expenses:   Food Insecurity:   . Worried About Programme researcher, broadcasting/film/video in the Last Year:   . Barista in the Last Year:   Transportation Needs:   . Freight forwarder (Medical):   Marland Kitchen Lack of  Transportation (Non-Medical):   Physical Activity:   . Days of Exercise per Week:   . Minutes of Exercise per Session:   Stress:   . Feeling of Stress :   Social Connections:   . Frequency of Communication with Friends and Family:   . Frequency of Social Gatherings with Friends and Family:   . Attends Religious Services:   . Active Member of Clubs or Organizations:   . Attends Banker Meetings:   Marland Kitchen Marital Status:   Intimate Partner Violence:   . Fear of Current or Ex-Partner:   . Emotionally Abused:   Marland Kitchen Physically Abused:   . Sexually Abused:     Allergies   Patient has no known allergies.  Family history:   Family History  Problem Relation Age of Onset  . CAD Sister   . Hypertension Sister   . Healthy Mother   . Healthy Father     Current Medications:   Prior to Admission medications   Medication Sig Start Date End Date Taking? Authorizing Provider  amLODipine (NORVASC) 10 MG tablet Take 1 tablet (10 mg total) by mouth daily. 02/18/20  Yes Pennie Banter, DO  aspirin EC 81 MG tablet Take 1 tablet (81 mg total) by mouth daily. 10/26/19 05/13/20 Yes Sreenath, Sudheer B, MD  atorvastatin (LIPITOR) 40 MG tablet Take 1 tablet (40 mg total) by mouth daily at 6 PM. 02/17/20  Yes Esaw Grandchild A, DO  insulin aspart (NOVOLOG) 100 UNIT/ML FlexPen Inject 12 Units into the skin 3 (three) times daily with meals. 02/17/20  Yes Esaw Grandchild A, DO  insulin glargine (LANTUS) 100 unit/mL SOPN Inject 0.18 mLs (18 Units total) into the skin daily. 02/17/20  Yes Esaw Grandchild A, DO  losartan (COZAAR) 50 MG tablet Take 1 tablet (50 mg total) by mouth daily. 02/18/20  Yes Esaw Grandchild A, DO  pantoprazole (PROTONIX) 40 MG tablet Take 1 tablet (40 mg total) by mouth daily. 02/18/20  Yes Pennie Banter, DO    Physical Exam:   Vitals:   03/03/20 1357 03/03/20 1400 03/03/20 1430 03/03/20 1530  BP:  136/79 (!) 150/86 (!) 146/91  Pulse: 93 93 92 99  Resp: (!) 23 (!) 23  (!) 23 20  Temp:      TempSrc:      SpO2: 97% 98% 97% 97%  Weight:      Height:         Physical Exam: Blood pressure (!) 146/91, pulse 99, temperature 98.4 F (36.9 C), temperature source Oral, resp. rate 20, height 5\' 10"  (1.778 m), weight 61.8 kg, SpO2 97 %. Gen: No acute distress. Head: Normocephalic, atraumatic. Eyes: Pupils equal, round and reactive to light. Extraocular movements intact.  Sclerae nonicteric.  Mouth: Dry mucous membranes Neck: Supple, no thyromegaly, no lymphadenopathy, no jugular venous distention. Chest: Lungs are  clear to auscultation with good air movement. No rales, rhonchi or wheezes.  CV: Heart sounds are regular with an S1, S2. No murmurs, rubs or gallops.  Abdomen: Soft, nontender, nondistended with normal active bowel sounds. No palpable masses. Extremities: Extremities are without clubbing, or cyanosis. No edema. Pedal pulses 2+.  Skin: Warm and dry. No rashes, lesions or wounds Neuro: Alert and oriented times 3; grossly nonfocal.  Psych: Insight is good and judgment is appropriate. Mood and affect normal.   Data Review:    Labs: Basic Metabolic Panel: Recent Labs  Lab 03/03/20 1250  NA 137  K 5.0  CL 104  CO2 11*  GLUCOSE 428*  BUN 23  CREATININE 1.42*  CALCIUM 9.4   Liver Function Tests: Recent Labs  Lab 03/03/20 1250  AST 15  ALT 20  ALKPHOS 86  BILITOT 2.3*  PROT 7.6  ALBUMIN 3.7   No results for input(s): LIPASE, AMYLASE in the last 168 hours. No results for input(s): AMMONIA in the last 168 hours. CBC: Recent Labs  Lab 03/03/20 1250  WBC 14.2*  HGB 14.5  HCT 44.8  MCV 94.9  PLT 278   Cardiac Enzymes: Recent Labs  Lab 03/03/20 1249  CKTOTAL 77    BNP (last 3 results) No results for input(s): PROBNP in the last 8760 hours. CBG: Recent Labs  Lab 03/03/20 1455 03/03/20 1626  GLUCAP 372* 376*    Urinalysis    Component Value Date/Time   COLORURINE YELLOW (A) 03/03/2020 1249   APPEARANCEUR CLEAR  (A) 03/03/2020 1249   LABSPEC 1.026 03/03/2020 1249   PHURINE 5.0 03/03/2020 1249   GLUCOSEU >=500 (A) 03/03/2020 1249   HGBUR NEGATIVE 03/03/2020 1249   BILIRUBINUR NEGATIVE 03/03/2020 1249   KETONESUR 80 (A) 03/03/2020 1249   PROTEINUR 30 (A) 03/03/2020 1249   NITRITE NEGATIVE 03/03/2020 1249   LEUKOCYTESUR NEGATIVE 03/03/2020 1249      Radiographic Studies: DG Chest 2 View  Result Date: 03/03/2020 CLINICAL DATA:  Pain following fall EXAM: CHEST - 2 VIEW COMPARISON:  Chest radiograph February 14, 2020 FINDINGS: Lungs are clear. Heart size and pulmonary vascularity are normal. No adenopathy. No pneumothorax. No appreciable bone lesions. Postoperative change noted in the lower cervical region. IMPRESSION: Lungs clear. Cardiac silhouette within normal limits. No pneumothorax. Electronically Signed   By: Bretta Bang III M.D.   On: 03/03/2020 13:34   CT Head Wo Contrast  Result Date: 03/03/2020 CLINICAL DATA:  Pain following fall EXAM: CT HEAD WITHOUT CONTRAST TECHNIQUE: Contiguous axial images were obtained from the base of the skull through the vertex without intravenous contrast. COMPARISON:  February 13, 2020 FINDINGS: Brain: Ventricles and sulci are normal in size and configuration. There is no intracranial mass, hemorrhage, extra-axial fluid collection, or midline shift. There is slight small vessel disease in the centra semiovale bilaterally. A small focus of decreased attenuation in the left lentiform nucleus likely represents an asymmetric sulcus. No acute infarct is demonstrable on this study. Vascular: No hyperdense vessel. There is calcification in each carotid siphon region. Skull: Burr hole defects in the left frontal and left parietal regions are stable. Bony calvarium otherwise appears intact and stable. Sinuses/Orbits: Visualized paranasal sinuses are clear. Visualized orbits appear symmetric bilaterally. Other: Visualized mastoid air cells are clear. There is debris in the right  external auditory canal. IMPRESSION: Slight periventricular small vessel disease, stable. No mass or hemorrhage. No acute infarct evident. Foci of arterial vascular calcification noted. Postoperative bony defects in the left frontal and  parietal bones. No new bony defect. Probable cerumen in the right external auditory canal. Electronically Signed   By: Bretta Bang III M.D.   On: 03/03/2020 13:38   DG HIP UNILAT W OR W/O PELVIS 2-3 VIEWS RIGHT  Result Date: 03/03/2020 CLINICAL DATA:  Pain following fall EXAM: DG HIP (WITH OR WITHOUT PELVIS) 2-3V RIGHT COMPARISON:  November 05, 2016 pelvis radiograph FINDINGS: Frontal pelvis as well as frontal and lateral right hip images obtained. No fracture or dislocation. There is mild symmetric narrowing of each hip joint. No erosive change. There is a stable sclerotic focus in the lateral right femoral neck measuring 1.5 x 1.5 cm. IMPRESSION: No fracture or dislocation. Slight symmetric narrowing of each hip joint. Stable sclerotic focus in the right femoral neck which potentially may represent a bone island. Stability since 2017 is felt to be indicative of benign etiology. Electronically Signed   By: Bretta Bang III M.D.   On: 03/03/2020 13:35    EKG: Independently reviewed. Normal sinus rhythm   Assessment/Plan:   Principal Problem:   DKA (diabetic ketoacidoses) (HCC) Active Problems:   Hypertension   AKI (acute kidney injury) (HCC)   Fall at home  DKA in a patient with type II DM: Admit to stepdown unit. Treat with IV fluids and IV insulin infusion per DKA protocol. Monitor glucose levels and BMP per protocol.  Acute kidney injury: Treat with IV fluids and monitor BMP.  Leukocytosis: This is likely from DKA. CBC.  S/p fall at home: No acute findings on CT head and x-ray right hip. PT evaluation. Check CK level to rule out rhabdomyolysis.  Hypertension: Continue amlodipine.  Hold losartan because of AKI.  Cocaine abuse: Patient said  he does not remember when he last used cocaine. Counseled to quit.  Body mass index is 19.55 kg/m.  Other information:   DVT prophylaxis: Lovenox Code Status: Full code Family Communication: Plan discussed with patient Disposition Plan: Plan to discharge home in 2 days Consults called: None Admission status: Inpatient  The medical decision making on this patient was of high complexity and the patient is at high risk for clinical deterioration, therefore this is a level 3 visit.  Time spent 70 minutes  Gabreal Worton [facility]   How to contact the [facility] Attending or Consulting provider 7A - 7P or covering provider during after hours 7P -7A, for this patient?   1. Check the care team in [facility] and look for a) attending/consulting TRH provider listed and b) the [facility] team listed 2. Log into [email or web address] and use [place]'s universal password to access. If you do not have the password, please contact the hospital operator. 3. Locate the [facility] provider you are looking for under [facility] and page to a number that you can be directly reached. 4. If you still have difficulty reaching the provider, please page the [facility] (Director on Call) for the Hospitalists listed on amion for assistance.  03/03/2020, 5:18 PM

## 2020-03-03 NOTE — ED Provider Notes (Signed)
[facility] Emergency Department Provider Note  Time seen: 12:46 PM  I have reviewed the triage vital signs and the nursing notes.   HISTORY  Chief Complaint Fall   HPI Spencer [name] is a 59 y.o. male with a past medical history of diabetes, gastric reflux, hypertension, presents to the emergency department after a fall.  According to the patient 2 days ago he fell getting out of shower landing on his right side.  Patient states he has had right hip pain ever since.  Has not been eating or drinking or taking his home medications including his insulin due to the hip pain not being able to walk.  Patient is not sure if he hit his head.  Patient states he has been feeling very weak.  He is very thirsty.  Describes his hip pain is moderate to severe in the right hip.  Also states mild left chest/clavicle pain.   Past Medical History:  Diagnosis Date  . Brain aneurysm   . Broken bones    clavical, ankle, arms toes wriast   . Diabetes mellitus without complication (HCC)   . Dysrhythmia   . GERD (gastroesophageal reflux disease)   . Hypertension   . Substance abuse [facility])     Patient Active Problem List   Diagnosis Date Noted  . Respiratory failure (HCC) 02/13/2020  . Weakness   . Lobar pneumonia (HCC)   . Hyperglycemia   . Tobacco abuse counseling   . Atrial fibrillation with RVR (HCC) 08/07/2019  . GERD (gastroesophageal reflux disease) 08/07/2019  . Acute renal failure (HCC)   . Hyperkalemia   . Sepsis (HCC) 06/09/2019  . AKI (acute kidney injury) (HCC) 02/19/2019  . Hypoglycemia 10/23/2018  . Chronic low back pain 01/16/2017  . Chronic neck pain 01/16/2017  . Diabetic neuropathy (HCC) 01/16/2017  . DM2 (diabetes mellitus, type 2) (HCC) 01/16/2017  . History of cocaine abuse (HCC) 01/16/2017  . Personal history of subdural hematoma 01/16/2017  . Closed displaced fracture of body of left calcaneus with delayed healing 12/13/2016  . Protein-calorie  malnutrition, severe 11/08/2016  . DKA, type 2 (HCC) 11/06/2016  . DKA (diabetic ketoacidoses) (HCC) 04/03/2015  . Hypertension 04/03/2015  . Depression 04/03/2015  . Opiate dependence (HCC) 04/03/2015  . Tobacco abuse 04/03/2015  . Lumbosacral neuritis 07/19/2014  . Pain of finger of right hand 07/19/2014  . Type II or unspecified type diabetes mellitus without mention of complication, not stated as uncontrolled 07/19/2014  . Knee pain 09/12/2013  . Hernia of flank 09/20/2012  . Epidermoid cyst of skin 09/20/2012  . Chronic pain of both shoulders 03/27/2012    Past Surgical History:  Procedure Laterality Date  . CORONARY/GRAFT ACUTE MI REVASCULARIZATION N/A 09/04/2019   Procedure: Coronary/Graft Acute MI Revascularization;  Surgeon: Alwyn Pea, MD;  Location: [facility] INVASIVE CV LAB;  Service: Cardiovascular;  Laterality: N/A;  . ESOPHAGOGASTRODUODENOSCOPY (EGD) WITH PROPOFOL N/A 06/04/2019   Procedure: ESOPHAGOGASTRODUODENOSCOPY (EGD) WITH PROPOFOL;  Surgeon: Toledo, Boykin Nearing, MD;  Location: [facility] ENDOSCOPY;  Service: Gastroenterology;  Laterality: N/A;  . HERNIA REPAIR    . IMPLANTATION / PLACEMENT OF STRIP ELECTRODES VIA BURR HOLES SUBDURAL     anyrusum  . LEFT HEART CATH AND CORONARY ANGIOGRAPHY N/A 09/04/2019   Procedure: LEFT HEART CATH AND CORONARY ANGIOGRAPHY;  Surgeon: Alwyn Pea, MD;  Location: [facility] INVASIVE CV LAB;  Service: Cardiovascular;  Laterality: N/A;  . NECK SURGERY    . TEE WITHOUT CARDIOVERSION N/A 06/03/2019  Procedure: TRANSESOPHAGEAL ECHOCARDIOGRAM (TEE);  Surgeon: Teodoro Spray, MD;  Location: [facility] ORS;  Service: Cardiovascular;  Laterality: N/A;    Prior to Admission medications   Medication Sig Start Date End Date Taking? Authorizing Provider  amLODipine (NORVASC) 10 MG tablet Take 1 tablet (10 mg total) by mouth daily. 02/18/20   Ezekiel Slocumb, DO  aspirin EC 81 MG tablet Take 1 tablet (81 mg total) by mouth daily. 10/26/19 05/13/20   Sidney Ace, MD  atorvastatin (LIPITOR) 40 MG tablet Take 1 tablet (40 mg total) by mouth daily at 6 PM. 02/17/20   Nicole Kindred A, DO  insulin aspart (NOVOLOG) 100 UNIT/ML FlexPen Inject 12 Units into the skin 3 (three) times daily with meals. 02/17/20   Nicole Kindred A, DO  insulin glargine (LANTUS) 100 unit/mL SOPN Inject 0.18 mLs (18 Units total) into the skin daily. 02/17/20   Ezekiel Slocumb, DO  losartan (COZAAR) 50 MG tablet Take 1 tablet (50 mg total) by mouth daily. 02/18/20   Ezekiel Slocumb, DO  pantoprazole (PROTONIX) 40 MG tablet Take 1 tablet (40 mg total) by mouth daily. 02/18/20   Ezekiel Slocumb, DO    No Known Allergies  Family History  Problem Relation Age of Onset  . CAD Sister   . Hypertension Sister   . Healthy Mother   . Healthy Father     Social History Social History   Tobacco Use  . Smoking status: Current Some Day Smoker  . Smokeless tobacco: Never Used  Substance Use Topics  . Alcohol use: Yes    Alcohol/week: 1.0 standard drinks    Types: 1 Cans of beer per week  . Drug use: Not Currently    Types: Cocaine    Review of Systems Constitutional: Negative for fever. Cardiovascular: Mild left chest/clavicle pain. Respiratory: Negative for shortness of breath. Gastrointestinal: Negative for abdominal pain, vomiting  Genitourinary: Negative for urinary compaints Musculoskeletal: Significant right hip pain Skin: Negative for skin complaints  Neurological: Negative for headache All other ROS negative  ____________________________________________   PHYSICAL EXAM:  VITAL SIGNS: ED Triage Vitals  Enc Vitals Group     BP 03/03/20 1239 (!) 129/96     Pulse Rate 03/03/20 1239 99     Resp 03/03/20 1239 18     Temp 03/03/20 1239 98.4 F (36.9 C)     Temp Source 03/03/20 1239 Oral     SpO2 03/03/20 1234 96 %     Weight 03/03/20 1240 136 lb 3.9 oz (61.8 kg)     Height 03/03/20 1240 5\' 10"  (1.778 m)     Head Circumference --       Peak Flow --      Pain Score 03/03/20 1240 10     Pain Loc --      Pain Edu? --      Excl. in [place]? --     Constitutional: Alert and oriented. Well appearing and in no distress. Eyes: Normal exam ENT      Head: Normocephalic and atraumatic.      Mouth/Throat: Mucous membranes are moist. Cardiovascular: Normal rate, regular rhythm. Respiratory: Normal respiratory effort without tachypnea nor retractions. Breath sounds are clear Gastrointestinal: Soft and nontender. No distention.  Musculoskeletal: Mild left upper chest tenderness to palpation.  Significant right hip tenderness palpation with pain with range of motion.  Neurovascular intact distally. Neurologic:  Normal speech and language. No gross focal neurologic deficits  Skin:  Skin is warm, dry and  intact.  Psychiatric: Mood and affect are normal.   ____________________________________________    EKG  EKG viewed and interpreted by myself shows a normal sinus rhythm at 97 bpm with a narrow QRS, normal axis, normal intervals, no concerning ST changes.  ____________________________________________    RADIOLOGY  X-rays are negative for acute abnormality. CT scan negative for acute abnormality.  ____________________________________________   INITIAL IMPRESSION / ASSESSMENT AND PLAN / ED COURSE  Pertinent labs & imaging results that were available during my care of the patient were reviewed by me and considered in my medical decision making (see chart for details).   Patient presents to the emergency department after a fall 2 days ago complaining of left upper chest pain, right hip pain.  Patient is diabetic and states he has not been taking his medication over the past few days because he has not been able to walk.  Also states he has not been eating or drinking.  We will check labs, IV hydrate, treat pain and nausea.  Will obtain x-ray images of the right hip and chest.  We also obtain CT imaging of the head as a precaution.   Patient agreeable plan of care.  Differential would include contusion, fracture, dislocation, hypoglycemia, metabolic or electrolyte abnormality, dehydration, DKA.  Patient denies any drug or alcohol use.  Images are negative for acute abnormality.  However patient does have elevated blood glucose with an anion gap of 22 and a VBG pH is 7.2 consistent with diabetic ketoacidosis.  We will place the patient on insulin infusion and admit to the hospitalist service.  Patient agreeable to plan of care.  Spencer [name] was evaluated in Emergency Department on 03/03/2020 for the symptoms described in the history of present illness. He was evaluated in the context of the global COVID-19 pandemic, which necessitated consideration that the patient might be at risk for infection with the SARS-CoV-2 virus that causes COVID-19. Institutional protocols and algorithms that pertain to the evaluation of patients at risk for COVID-19 are in a state of rapid change based on information released by regulatory bodies including the CDC and federal and state organizations. These policies and algorithms were followed during the patient's care in the ED.   CRITICAL CARE Performed by: Minna Antis   Total critical care time: 30 minutes  Critical care time was exclusive of separately billable procedures and treating other patients.  Critical care was necessary to treat or prevent imminent or life-threatening deterioration.  Critical care was time spent personally by me on the following activities: development of treatment plan with patient and/or surrogate as well as nursing, discussions with consultants, evaluation of patient's response to treatment, examination of patient, obtaining history from patient or surrogate, ordering and performing treatments and interventions, ordering and review of laboratory studies, ordering and review of radiographic studies, pulse oximetry and re-evaluation of patient's  condition.   ____________________________________________   FINAL CLINICAL IMPRESSION(S) / ED DIAGNOSES  Fall Right hip pain DKA   Minna Antis, MD 03/03/20 1414

## 2020-03-03 NOTE — ED Notes (Signed)
Pt banks removed and wallet sat on bedside table at this time.

## 2020-03-04 ENCOUNTER — Inpatient Hospital Stay: Payer: Medicaid Other

## 2020-03-04 DIAGNOSIS — R509 Fever, unspecified: Secondary | ICD-10-CM | POA: Diagnosis not present

## 2020-03-04 LAB — BASIC METABOLIC PANEL
Anion gap: 9 (ref 5–15)
Anion gap: 8 (ref 5–15)
BUN: 23 mg/dL (ref 8–23)
BUN: 18 mg/dL (ref 8–23)
CO2: 18 mmol/L — ABNORMAL LOW (ref 22–32)
CO2: 19 mmol/L — ABNORMAL LOW (ref 22–32)
Calcium: 8.7 mg/dL — ABNORMAL LOW (ref 8.9–10.3)
Calcium: 8.4 mg/dL — ABNORMAL LOW (ref 8.9–10.3)
Chloride: 104 mmol/L (ref 98–111)
Chloride: 98 mmol/L (ref 98–111)
Creatinine, Ser: 0.96 mg/dL (ref 0.61–1.24)
Creatinine, Ser: 1 mg/dL (ref 0.61–1.24)
GFR calc Af Amer: >60 mL/min (ref >60)
GFR calc Af Amer: >60 mL/min (ref >60)
GFR calc non Af Amer: >60 mL/min (ref >60)
GFR calc non Af Amer: >60 mL/min (ref >60)
Glucose, Bld: 89 mg/dL (ref 70–99)
Glucose, Bld: 389 mg/dL — ABNORMAL HIGH (ref 70–99)
Potassium: 4.5 mmol/L (ref 3.5–5.1)
Potassium: 4.4 mmol/L (ref 3.5–5.1)
Sodium: 131 mmol/L — ABNORMAL LOW (ref 135–145)
Sodium: 125 mmol/L — ABNORMAL LOW (ref 135–145)

## 2020-03-04 LAB — GLUCOSE, CAPILLARY
Glucose-Capillary: 127 mg/dL — ABNORMAL HIGH (ref 70–99)
Glucose-Capillary: 158 mg/dL — ABNORMAL HIGH (ref 70–99)
Glucose-Capillary: 185 mg/dL — ABNORMAL HIGH (ref 70–99)
Glucose-Capillary: 219 mg/dL — ABNORMAL HIGH (ref 70–99)
Glucose-Capillary: 237 mg/dL — ABNORMAL HIGH (ref 70–99)
Glucose-Capillary: 270 mg/dL — ABNORMAL HIGH (ref 70–99)
Glucose-Capillary: 272 mg/dL — ABNORMAL HIGH (ref 70–99)
Glucose-Capillary: 275 mg/dL — ABNORMAL HIGH (ref 70–99)
Glucose-Capillary: 309 mg/dL — ABNORMAL HIGH (ref 70–99)
Glucose-Capillary: 404 mg/dL — ABNORMAL HIGH (ref 70–99)
Glucose-Capillary: 480 mg/dL — ABNORMAL HIGH (ref 70–99)
Glucose-Capillary: 76 mg/dL (ref 70–99)

## 2020-03-04 LAB — CBC WITH DIFFERENTIAL/PLATELET
Abs Immature Granulocytes: 0.05 10*3/uL (ref 0.00–0.07)
Basophils Absolute: 0 10*3/uL (ref 0.0–0.1)
Basophils Relative: 0 %
Eosinophils Absolute: 0 10*3/uL (ref 0.0–0.5)
Eosinophils Relative: 0 %
HCT: 35 % — ABNORMAL LOW (ref 39.0–52.0)
Hemoglobin: 11.8 g/dL — ABNORMAL LOW (ref 13.0–17.0)
Immature Granulocytes: 1 %
Lymphocytes Relative: 4 %
Lymphs Abs: 0.4 10*3/uL — ABNORMAL LOW (ref 0.7–4.0)
MCH: 31.3 pg (ref 26.0–34.0)
MCHC: 33.7 g/dL (ref 30.0–36.0)
MCV: 92.8 fL (ref 80.0–100.0)
Monocytes Absolute: 0.8 10*3/uL (ref 0.1–1.0)
Monocytes Relative: 8 %
Neutro Abs: 8.6 10*3/uL — ABNORMAL HIGH (ref 1.7–7.7)
Neutrophils Relative %: 87 %
Platelets: 202 10*3/uL (ref 150–400)
RBC: 3.77 MIL/uL — ABNORMAL LOW (ref 4.22–5.81)
RDW: 13.4 % (ref 11.5–15.5)
WBC: 9.9 10*3/uL (ref 4.0–10.5)
nRBC: 0 % (ref 0.0–0.2)

## 2020-03-04 LAB — BRAIN NATRIURETIC PEPTIDE: B Natriuretic Peptide: 120 pg/mL — ABNORMAL HIGH (ref 0.0–100.0)

## 2020-03-04 LAB — LACTIC ACID, PLASMA
Lactic Acid, Venous: 2 mmol/L (ref 0.5–1.9)
Lactic Acid, Venous: 1 mmol/L (ref 0.5–1.9)

## 2020-03-04 LAB — PROCALCITONIN: Procalcitonin: 6.98 ng/mL

## 2020-03-04 MED ORDER — INSULIN ASPART 100 UNIT/ML ~~LOC~~ SOLN: 10.0000 [IU] | Freq: Three times a day (TID) | SUBCUTANEOUS | Status: DC

## 2020-03-04 MED ORDER — INSULIN GLARGINE 100 UNIT/ML ~~LOC~~ SOLN: 20.0000 [IU] | Freq: Every day | SUBCUTANEOUS | Status: DC

## 2020-03-04 MED ORDER — INSULIN ASPART 100 UNIT/ML ~~LOC~~ SOLN
0.0000 [IU] | Freq: Three times a day (TID) | SUBCUTANEOUS | Status: DC
  Administered 2020-03-04: 5 [IU] via SUBCUTANEOUS
  Administered 2020-03-04: 2 [IU] via SUBCUTANEOUS
  Administered 2020-03-05: 7 [IU] via SUBCUTANEOUS
  Administered 2020-03-05: 5 [IU] via SUBCUTANEOUS
  Administered 2020-03-06: 3 [IU] via SUBCUTANEOUS
  Administered 2020-03-06: 2 [IU] via SUBCUTANEOUS
  Administered 2020-03-07: 5 [IU] via SUBCUTANEOUS
  Administered 2020-03-07 (×2): 3 [IU] via SUBCUTANEOUS
  Administered 2020-03-08: 2 [IU] via SUBCUTANEOUS
  Administered 2020-03-08: 3 [IU] via SUBCUTANEOUS
  Administered 2020-03-09: 5 [IU] via SUBCUTANEOUS
  Administered 2020-03-09: 3 [IU] via SUBCUTANEOUS
  Administered 2020-03-09: 2 [IU] via SUBCUTANEOUS
  Filled 2020-03-04 (×14): qty 1

## 2020-03-04 MED ORDER — INSULIN ASPART 100 UNIT/ML ~~LOC~~ SOLN
3.0000 [IU] | Freq: Three times a day (TID) | SUBCUTANEOUS | Status: DC
  Administered 2020-03-04 (×2): 3 [IU] via SUBCUTANEOUS
  Filled 2020-03-04 (×2): qty 1

## 2020-03-04 MED ORDER — INSULIN REGULAR HUMAN 100 UNIT/ML IJ SOLN
10.0000 [IU] | Freq: Once | INTRAMUSCULAR | Status: DC
  Filled 2020-03-04: qty 3

## 2020-03-04 MED ORDER — INSULIN ASPART 100 UNIT/ML ~~LOC~~ SOLN
10.0000 [IU] | Freq: Once | SUBCUTANEOUS | Status: AC
  Administered 2020-03-04: 10 [IU] via SUBCUTANEOUS
  Filled 2020-03-04: qty 1

## 2020-03-04 MED ORDER — SODIUM CHLORIDE 0.9 % IV SOLN: INTRAVENOUS | Status: DC

## 2020-03-04 MED ORDER — IPRATROPIUM-ALBUTEROL 0.5-2.5 (3) MG/3ML IN SOLN
3.0000 mL | Freq: Four times a day (QID) | RESPIRATORY_TRACT | Status: DC | PRN
  Administered 2020-03-04: 3 mL via RESPIRATORY_TRACT
  Filled 2020-03-04: qty 3

## 2020-03-04 MED ORDER — OXYCODONE HCL 5 MG PO TABS
5.0000 mg | ORAL_TABLET | Freq: Four times a day (QID) | ORAL | Status: DC | PRN
  Administered 2020-03-04 – 2020-03-08 (×9): 5 mg via ORAL
  Filled 2020-03-04 (×9): qty 1

## 2020-03-04 MED ORDER — TRAMADOL HCL 50 MG PO TABS
50.0000 mg | ORAL_TABLET | Freq: Four times a day (QID) | ORAL | Status: DC | PRN
  Administered 2020-03-04 – 2020-03-05 (×3): 50 mg via ORAL
  Filled 2020-03-04 (×3): qty 1

## 2020-03-04 MED ORDER — INSULIN ASPART 100 UNIT/ML ~~LOC~~ SOLN
8.0000 [IU] | Freq: Three times a day (TID) | SUBCUTANEOUS | Status: DC
  Administered 2020-03-04: 8 [IU] via SUBCUTANEOUS
  Filled 2020-03-04 (×2): qty 1

## 2020-03-04 MED ORDER — CHLORHEXIDINE GLUCONATE CLOTH 2 % EX PADS
6.0000 | MEDICATED_PAD | Freq: Every day | CUTANEOUS | Status: DC
  Administered 2020-03-04 – 2020-03-09 (×4): 6 via TOPICAL

## 2020-03-04 MED ORDER — INSULIN GLARGINE 100 UNIT/ML ~~LOC~~ SOLN
10.0000 [IU] | Freq: Every day | SUBCUTANEOUS | Status: DC
  Filled 2020-03-04: qty 0.1

## 2020-03-04 MED ORDER — PIPERACILLIN-TAZOBACTAM 3.375 G IVPB
3.3750 g | Freq: Three times a day (TID) | INTRAVENOUS | Status: DC
  Administered 2020-03-04 – 2020-03-05 (×2): 3.375 g via INTRAVENOUS
  Filled 2020-03-04 (×3): qty 50

## 2020-03-04 MED ORDER — INSULIN GLARGINE 100 UNIT/ML ~~LOC~~ SOLN
20.0000 [IU] | Freq: Every day | SUBCUTANEOUS | Status: DC
  Administered 2020-03-04: 20 [IU] via SUBCUTANEOUS
  Filled 2020-03-04 (×2): qty 0.2

## 2020-03-04 MED ORDER — MORPHINE SULFATE (PF) 4 MG/ML IV SOLN
4.0000 mg | Freq: Four times a day (QID) | INTRAVENOUS | Status: DC | PRN
  Administered 2020-03-04: 4 mg via INTRAVENOUS
  Filled 2020-03-04: qty 1

## 2020-03-04 MED ORDER — INSULIN ASPART 100 UNIT/ML ~~LOC~~ SOLN
0.0000 [IU] | Freq: Every day | SUBCUTANEOUS | Status: DC
  Administered 2020-03-04: 4 [IU] via SUBCUTANEOUS
  Administered 2020-03-05: 3 [IU] via SUBCUTANEOUS
  Administered 2020-03-07: 2 [IU] via SUBCUTANEOUS
  Filled 2020-03-04 (×3): qty 1

## 2020-03-04 NOTE — Progress Notes (Signed)
PT Cancellation Note  Patient Details Name: [name] MRN: [id number] DOB: 01-14-1961   Cancelled Treatment:    Reason Eval/Treat Not Completed: Other (comment). Pt with pending CT of R hip due to recent fall. Will hold PT evaluation until medically cleared.   Doniel Maiello 03/04/2020, 2:01 PM  Elizabeth Palau, PT, DPT [phone]

## 2020-03-04 NOTE — Progress Notes (Signed)
Pharmacy Antibiotic Note  Spencer [name] is a 59 y.o. male admitted on 03/03/2020 with sepsis.  Pharmacy has been consulted for Zosyn dosing.  Plan: Zosyn 3.375g IV q8h (4 hour infusion).  Height: 5\' 10"  (177.8 cm) Weight: 61.8 kg (136 lb 3.9 oz) IBW/kg (Calculated) : 73  Temp (24hrs), Avg:98.8 F (37.1 C), Min:98.6 F (37 C), Max:99.1 F (37.3 C)  Recent Labs  Lab 03/03/20 1250 03/03/20 2144 03/04/20 0411 03/04/20 1656  WBC 14.2*  --   --   --   CREATININE 1.42* 1.11 0.96 1.00    Estimated Creatinine Clearance: 67.8 mL/min (by C-G formula based on SCr of 1 mg/dL).    No Known Allergies  Antimicrobials this admission:   >>    >>   Dose adjustments this admission:   Microbiology results:  BCx:   UCx:    Sputum:    MRSA PCR:   Thank you for allowing pharmacy to be a part of this patient's care.  Dozier Berkovich D 03/04/2020 6:58 PM

## 2020-03-04 NOTE — Progress Notes (Signed)
Patient with blood glucose of 480.  MD notified.  Stat met B obtained.

## 2020-03-04 NOTE — Progress Notes (Signed)
Dr. Myriam Forehand returned page and was updated on elevated temperature, slight increase in respiratory rate, hypoxia, and bilateral rhonchi, and continued elevated blood sugar of 404.  MD verbalized he would review patients chart and place appropriate orders and cancel transfer to floor.

## 2020-03-04 NOTE — Significant Event (Addendum)
I was notified by nurse that patient developed a fever with temperature of 102.6 F, tachypnea and mild hypoxemia with oxygen saturation of 88 to 89% on room air.  Stat chest x-ray, blood cultures, lactic acid, CBC, procalcitonin and BNP have been ordered for further evaluation.  IV Zosyn has also been ordered empirically for presumed sepsis pending further evaluation. Restart IV fluids for hydration.  Of note, repeat BMP done late this afternoon showed hyperglycemia and hyponatremia but there was no evidence of DKA.  CT right hip is still pending.  Patient will be kept on stepdown unit for further management.  Transfer to the medical floor has been canceled.  Plan discussed with Fleet Contras, RN.

## 2020-03-04 NOTE — Progress Notes (Signed)
Spoke with Webb Silversmith, NP and advised pt's last two CBG'S 219 and 76, pt transitioned to D5 1/2NS per protocol and [employer] instructed to d/c insulin gtt. No transition orders received or BMP ordered by NP. Per NP no changes or new orders until 5:00AM BMP. Awaiting BMP draw.

## 2020-03-04 NOTE — Progress Notes (Signed)
Patient with oral temperature of 102.6.  Patient 02 sats 88-89% on room air.  Patient with bilateral rhonchi.  MD paged. Tylenol 650mg  PO given for elevated temperature and 2L nasal cannula applied.

## 2020-03-04 NOTE — Progress Notes (Signed)
Progress Note    Spencer [name]  KGM:[id number] DOB: May 22, 1961  DOA: 03/03/2020 PCP: Patient, No Pcp Per      Brief Narrative:    Medical records reviewed and are as summarized below:  Spencer [name] is an 59 y.o. male with medical history significant for type 2 diabetes mellitus, cocaine abuse, brain aneurysm, hypertension, GERD, recent discharge from the hospital on 02/17/2020 after hospitalization for DKA, AKI, cocaine intoxication, metabolic encephalopathy and respiratory failure requiring intubation.  He presented to the hospital after a fall at home. He said he fell 2 days ago when he was trying to place a new showerhead in his bathroom. He stood on the edge of the bathtub but unfortunately he slipped and fell while attempting to place a new showerhead. He was unable to get up after he fell. He laid on the floor for 2 days. He said he could not eat nor drink and he could not take any of his medicines including insulin. He said he has a roommate but the roommate could not help him. He said his roommate cannot even help himself. He complained of pain in his right hip that developed after the fall. He also complained of thirst, hunger, dry throat and generalized weakness      Assessment/Plan:   Principal Problem:   DKA (diabetic ketoacidoses) (HCC) Active Problems:   Hypertension   AKI (acute kidney injury) (HCC)   Fall at home   DKA in a patient with type II DM: He is off of IV insulin infusion.  Discontinue IV fluids.  He has been started on Lantus and NovoLog.  Patient said he takes 22 units of Lantus daily at home.  Monitor glucose levels.  I was informed by the nurse that his family brought him chocolate milkshake for lunch and his blood sugar went up to 480.  Check BMP.  DKA protocol will be restarted if BMP shows recurrence of DKA.  Acute kidney injury: Resolved.  Discontinue IV fluids.  Hyponatremia: Asymptomatic.  This probably from hyperglycemia.   Monitor.  Leukocytosis: This is likely from DKA. CBC.  Right hip pain, s/p fall at home: No acute findings on CT head and x-ray right hip.  CT of the right hip has been ordered for further evaluation because of persistent right hip pain.  PT evaluation.  CK level was normal.  Hypertension: Continue amlodipine.  Hold losartan because of AKI.  Cocaine abuse: Patient said he does not remember when he last used cocaine. Counseled to quit.    Body mass index is 19.55 kg/m.   Family Communication/Anticipated D/C date and plan/Code Status   DVT prophylaxis: Lovenox Code Status: Full code Family Communication: Plan discussed with patient Disposition Plan:    Status is: Inpatient  Remains inpatient appropriate because:Inpatient level of care appropriate due to severity of illness   Dispo: The patient is from: Home              Anticipated d/c is to: Home              Anticipated d/c date is: 1 day              Patient currently is not medically stable to d/c.            Subjective:   He complains of severe pain in the right hip and he does not think he can walk  Objective:    Vitals:   03/04/20 1200 03/04/20 1300 03/04/20  1400 03/04/20 1500  BP: 138/80 (!) 148/84 123/73 111/70  Pulse: 95 (!) 111 (!) 113 (!) 101  Resp: (!) 25 (!) 39 (!) 32 (!) 23  Temp:      TempSrc:      SpO2: 94% 93% 94% 95%  Weight:      Height:       No data found.   Intake/Output Summary (Last 24 hours) at 03/04/2020 1709 Last data filed at 03/04/2020 1500 Gross per 24 hour  Intake 1917.58 ml  Output 1300 ml  Net 617.58 ml   Filed Weights   03/03/20 1240  Weight: 61.8 kg    Exam:  GEN: NAD SKIN: No rash EYES: EOMI ENT: MMM CV: RRR PULM: CTA B ABD: soft, ND, NT, +BS CNS: AAO x 3, non focal EXT: Right hip tenderness with mild swelling   Data Reviewed:   I have personally reviewed following labs and imaging studies:  Labs: Labs show the following:   Basic  Metabolic Panel: Recent Labs  Lab 03/03/20 1250 03/03/20 1250 03/03/20 2144 03/04/20 0411  NA 137  --  132* 131*  K 5.0   < > 4.9 4.5  CL 104  --  103 104  CO2 11*  --  13* 18*  GLUCOSE 428*  --  212* 89  BUN 23  --  22 23  CREATININE 1.42*  --  1.11 0.96  CALCIUM 9.4  --  8.8* 8.7*  MG  --   --  2.1  --    < > = values in this interval not displayed.   GFR Estimated Creatinine Clearance: 70.6 mL/min (by C-G formula based on SCr of 0.96 mg/dL). Liver Function Tests: Recent Labs  Lab 03/03/20 1250  AST 15  ALT 20  ALKPHOS 86  BILITOT 2.3*  PROT 7.6  ALBUMIN 3.7   No results for input(s): LIPASE, AMYLASE in the last 168 hours. No results for input(s): AMMONIA in the last 168 hours. Coagulation profile No results for input(s): INR, PROTIME in the last 168 hours.  CBC: Recent Labs  Lab 03/03/20 1250  WBC 14.2*  HGB 14.5  HCT 44.8  MCV 94.9  PLT 278   Cardiac Enzymes: Recent Labs  Lab 03/03/20 1249  CKTOTAL 77   BNP (last 3 results) No results for input(s): PROBNP in the last 8760 hours. CBG: Recent Labs  Lab 03/04/20 0756 03/04/20 0908 03/04/20 1133 03/04/20 1325 03/04/20 1625  GLUCAP 127* 185* 275* 272* 480*   D-Dimer: No results for input(s): DDIMER in the last 72 hours. Hgb A1c: No results for input(s): HGBA1C in the last 72 hours. Lipid Profile: No results for input(s): CHOL, HDL, LDLCALC, TRIG, CHOLHDL, LDLDIRECT in the last 72 hours. Thyroid function studies: No results for input(s): TSH, T4TOTAL, T3FREE, THYROIDAB in the last 72 hours.  Invalid input(s): FREET3 Anemia work up: No results for input(s): VITAMINB12, FOLATE, FERRITIN, TIBC, IRON, RETICCTPCT in the last 72 hours. Sepsis Labs: Recent Labs  Lab 03/03/20 1250  WBC 14.2*    Microbiology Recent Results (from the past 240 hour(s))  Respiratory Panel by RT PCR (Flu A&B, Covid) - Nasopharyngeal Swab     Status: None   Collection Time: 03/03/20 12:50 PM   Specimen:  Nasopharyngeal Swab  Result Value Ref Range Status   SARS Coronavirus 2 by RT PCR NEGATIVE NEGATIVE Final    Comment: (NOTE) SARS-CoV-2 target nucleic acids are NOT DETECTED. The SARS-CoV-2 RNA is generally detectable in upper respiratoy specimens during the  acute phase of infection. The lowest concentration of SARS-CoV-2 viral copies this assay can detect is 131 copies/mL. A negative result does not preclude SARS-Cov-2 infection and should not be used as the sole basis for treatment or other patient management decisions. A negative result may occur with  improper specimen collection/handling, submission of specimen other than nasopharyngeal swab, presence of viral mutation(s) within the areas targeted by this assay, and inadequate number of viral copies (<131 copies/mL). A negative result must be combined with clinical observations, patient history, and epidemiological information. The expected result is Negative. Fact Sheet for Patients:  PinkCheek.be Fact Sheet for Healthcare Providers:  GravelBags.it This test is not yet ap proved or cleared by the [place] FDA and  has been authorized for detection and/or diagnosis of SARS-CoV-2 by FDA under an Emergency Use Authorization (EUA). This EUA will remain  in effect (meaning this test can be used) for the duration of the COVID-19 declaration under Section 564(b)(1) of the Act, 21 U.S.C. section 360bbb-3(b)(1), unless the authorization is terminated or revoked sooner.    Influenza A by PCR NEGATIVE NEGATIVE Final   Influenza B by PCR NEGATIVE NEGATIVE Final    Comment: (NOTE) The Xpert Xpress SARS-CoV-2/FLU/RSV assay is intended as an aid in  the diagnosis of influenza from Nasopharyngeal swab specimens and  should not be used as a sole basis for treatment. Nasal washings and  aspirates are unacceptable for Xpert Xpress SARS-CoV-2/FLU/RSV  testing. Fact Sheet for  Patients: PinkCheek.be Fact Sheet for Healthcare Providers: GravelBags.it This test is not yet approved or cleared by the [place] FDA and  has been authorized for detection and/or diagnosis of SARS-CoV-2 by  FDA under an Emergency Use Authorization (EUA). This EUA will remain  in effect (meaning this test can be used) for the duration of the  Covid-19 declaration under Section 564(b)(1) of the Act, 21  U.S.C. section 360bbb-3(b)(1), unless the authorization is  terminated or revoked. Performed at [facility], [address] [place], [place] [address]     Procedures and diagnostic studies:  DG Chest 2 View  Result Date: 03/03/2020 CLINICAL DATA:  Pain following fall EXAM: CHEST - 2 VIEW COMPARISON:  Chest radiograph February 14, 2020 FINDINGS: Lungs are clear. Heart size and pulmonary vascularity are normal. No adenopathy. No pneumothorax. No appreciable bone lesions. Postoperative change noted in the lower cervical region. IMPRESSION: Lungs clear. Cardiac silhouette within normal limits. No pneumothorax. Electronically Signed   By: Lowella Grip III M.D.   On: 03/03/2020 13:34   CT Head Wo Contrast  Result Date: 03/03/2020 CLINICAL DATA:  Pain following fall EXAM: CT HEAD WITHOUT CONTRAST TECHNIQUE: Contiguous axial images were obtained from the base of the skull through the vertex without intravenous contrast. COMPARISON:  February 13, 2020 FINDINGS: Brain: Ventricles and sulci are normal in size and configuration. There is no intracranial mass, hemorrhage, extra-axial fluid collection, or midline shift. There is slight small vessel disease in the centra semiovale bilaterally. A small focus of decreased attenuation in the left lentiform nucleus likely represents an asymmetric sulcus. No acute infarct is demonstrable on this study. Vascular: No hyperdense vessel. There is calcification in each carotid siphon region.  Skull: Burr hole defects in the left frontal and left parietal regions are stable. Bony calvarium otherwise appears intact and stable. Sinuses/Orbits: Visualized paranasal sinuses are clear. Visualized orbits appear symmetric bilaterally. Other: Visualized mastoid air cells are clear. There is debris in the right external auditory canal. IMPRESSION: Slight periventricular  small vessel disease, stable. No mass or hemorrhage. No acute infarct evident. Foci of arterial vascular calcification noted. Postoperative bony defects in the left frontal and parietal bones. No new bony defect. Probable cerumen in the right external auditory canal. Electronically Signed   By: Bretta Bang III M.D.   On: 03/03/2020 13:38   DG HIP UNILAT W OR W/O PELVIS 2-3 VIEWS RIGHT  Result Date: 03/03/2020 CLINICAL DATA:  Pain following fall EXAM: DG HIP (WITH OR WITHOUT PELVIS) 2-3V RIGHT COMPARISON:  November 05, 2016 pelvis radiograph FINDINGS: Frontal pelvis as well as frontal and lateral right hip images obtained. No fracture or dislocation. There is mild symmetric narrowing of each hip joint. No erosive change. There is a stable sclerotic focus in the lateral right femoral neck measuring 1.5 x 1.5 cm. IMPRESSION: No fracture or dislocation. Slight symmetric narrowing of each hip joint. Stable sclerotic focus in the right femoral neck which potentially may represent a bone island. Stability since 2017 is felt to be indicative of benign etiology. Electronically Signed   By: Bretta Bang III M.D.   On: 03/03/2020 13:35    Medications:   . amLODipine  10 mg Oral Daily  . aspirin EC  81 mg Oral Daily  . atorvastatin  40 mg Oral q1800  . Chlorhexidine Gluconate Cloth  6 each Topical Daily  . enoxaparin (LOVENOX) injection  40 mg Subcutaneous Q24H  . insulin aspart  0-9 Units Subcutaneous TID WC  . insulin aspart  8 Units Subcutaneous TID WC  . insulin glargine  20 Units Subcutaneous Daily  . pantoprazole  40 mg Oral  Daily   Continuous Infusions:    LOS: 1 day   Dreamer Carillo  [facility]     03/04/2020, 5:09 PM

## 2020-03-05 ENCOUNTER — Inpatient Hospital Stay
Admit: 2020-03-05 | Discharge: 2020-03-05 | Disposition: A | Discharge status: Home / Self Care | Payer: Medicaid Other | Attending: Internal Medicine | Admitting: Internal Medicine

## 2020-03-05 ENCOUNTER — Inpatient Hospital Stay: Payer: Medicaid Other

## 2020-03-05 DIAGNOSIS — B9561 Methicillin susceptible Staphylococcus aureus infection as the cause of diseases classified elsewhere: Secondary | ICD-10-CM

## 2020-03-05 DIAGNOSIS — S12300A Unspecified displaced fracture of fourth cervical vertebra, initial encounter for closed fracture: Secondary | ICD-10-CM | POA: Clinically undetermined

## 2020-03-05 DIAGNOSIS — W19XXXD Unspecified fall, subsequent encounter: Secondary | ICD-10-CM

## 2020-03-05 DIAGNOSIS — A4101 Sepsis due to Methicillin susceptible Staphylococcus aureus: Secondary | ICD-10-CM | POA: Diagnosis not present

## 2020-03-05 DIAGNOSIS — R7881 Bacteremia: Secondary | ICD-10-CM

## 2020-03-05 LAB — CBC WITH DIFFERENTIAL/PLATELET
Abs Immature Granulocytes: 0.09 10*3/uL — ABNORMAL HIGH (ref 0.00–0.07)
Basophils Absolute: 0 10*3/uL (ref 0.0–0.1)
Basophils Relative: 0 %
Eosinophils Absolute: 0 10*3/uL (ref 0.0–0.5)
Eosinophils Relative: 0 %
HCT: 36.5 % — ABNORMAL LOW (ref 39.0–52.0)
Hemoglobin: 12.6 g/dL — ABNORMAL LOW (ref 13.0–17.0)
Immature Granulocytes: 1 %
Lymphocytes Relative: 4 %
Lymphs Abs: 0.4 10*3/uL — ABNORMAL LOW (ref 0.7–4.0)
MCH: 31.1 pg (ref 26.0–34.0)
MCHC: 34.5 g/dL (ref 30.0–36.0)
MCV: 90.1 fL (ref 80.0–100.0)
Monocytes Absolute: 0.8 10*3/uL (ref 0.1–1.0)
Monocytes Relative: 8 %
Neutro Abs: 9.1 10*3/uL — ABNORMAL HIGH (ref 1.7–7.7)
Neutrophils Relative %: 87 %
Platelets: 175 10*3/uL (ref 150–400)
RBC: 4.05 MIL/uL — ABNORMAL LOW (ref 4.22–5.81)
RDW: 13.2 % (ref 11.5–15.5)
WBC: 10.4 10*3/uL (ref 4.0–10.5)
nRBC: 0 % (ref 0.0–0.2)

## 2020-03-05 LAB — BASIC METABOLIC PANEL
Anion gap: 6 (ref 5–15)
BUN: 13 mg/dL (ref 8–23)
CO2: 24 mmol/L (ref 22–32)
Calcium: 8.4 mg/dL — ABNORMAL LOW (ref 8.9–10.3)
Chloride: 100 mmol/L (ref 98–111)
Creatinine, Ser: 0.89 mg/dL (ref 0.61–1.24)
GFR calc Af Amer: >60 mL/min (ref >60)
GFR calc non Af Amer: >60 mL/min (ref >60)
Glucose, Bld: 229 mg/dL — ABNORMAL HIGH (ref 70–99)
Potassium: 3.9 mmol/L (ref 3.5–5.1)
Sodium: 130 mmol/L — ABNORMAL LOW (ref 135–145)

## 2020-03-05 LAB — GLUCOSE, CAPILLARY
Glucose-Capillary: 299 mg/dL — ABNORMAL HIGH (ref 70–99)
Glucose-Capillary: 353 mg/dL — ABNORMAL HIGH (ref 70–99)
Glucose-Capillary: 316 mg/dL — ABNORMAL HIGH (ref 70–99)
Glucose-Capillary: 263 mg/dL — ABNORMAL HIGH (ref 70–99)

## 2020-03-05 LAB — BLOOD CULTURE ID PANEL (REFLEXED)

## 2020-03-05 LAB — PROCALCITONIN: Procalcitonin: 6.21 ng/mL

## 2020-03-05 MED ORDER — INSULIN ASPART 100 UNIT/ML ~~LOC~~ SOLN
10.0000 [IU] | Freq: Three times a day (TID) | SUBCUTANEOUS | Status: DC
  Administered 2020-03-05 – 2020-03-09 (×11): 10 [IU] via SUBCUTANEOUS
  Filled 2020-03-05 (×11): qty 1

## 2020-03-05 MED ORDER — HYDROMORPHONE HCL 1 MG/ML IJ SOLN
1.0000 mg | INTRAMUSCULAR | Status: DC | PRN
  Administered 2020-03-05 – 2020-03-06 (×3): 1 mg via INTRAVENOUS
  Filled 2020-03-05 (×4): qty 1

## 2020-03-05 MED ORDER — INSULIN GLARGINE 100 UNIT/ML ~~LOC~~ SOLN
25.0000 [IU] | Freq: Every day | SUBCUTANEOUS | Status: DC
  Administered 2020-03-05 – 2020-03-09 (×5): 25 [IU] via SUBCUTANEOUS
  Filled 2020-03-05 (×6): qty 0.25

## 2020-03-05 MED ORDER — ENOXAPARIN SODIUM 40 MG/0.4ML ~~LOC~~ SOLN
40.0000 mg | SUBCUTANEOUS | Status: DC
  Administered 2020-03-05 – 2020-03-07 (×3): 40 mg via SUBCUTANEOUS
  Filled 2020-03-05 (×3): qty 0.4

## 2020-03-05 MED ORDER — LOSARTAN POTASSIUM 50 MG PO TABS
50.0000 mg | ORAL_TABLET | Freq: Every day | ORAL | Status: DC
  Administered 2020-03-05 – 2020-03-09 (×5): 50 mg via ORAL
  Filled 2020-03-05 (×3): qty 1

## 2020-03-05 MED ORDER — CEFAZOLIN SODIUM-DEXTROSE 2-4 GM/100ML-% IV SOLN
2.0000 g | Freq: Three times a day (TID) | INTRAVENOUS | Status: DC
  Administered 2020-03-05 – 2020-03-09 (×13): 2 g via INTRAVENOUS
  Filled 2020-03-05 (×18): qty 100

## 2020-03-05 NOTE — Progress Notes (Signed)
Pharmacy Antibiotic Note  Spencer [name] is a 59 y.o. male admitted on 03/03/2020 with sepsis and bacteremia.  Pharmacy has been consulted for Ancef dosing.    Plan: D/C Zosyn Ancef 2gm IV q8hrs  Height: 5\' 10"  (177.8 cm) Weight: 61.8 kg (136 lb 3.9 oz) IBW/kg (Calculated) : 73  Temp (24hrs), Avg:99.2 F (37.3 C), Min:98.1 F (36.7 C), Max:100.3 F (37.9 C)  Recent Labs  Lab 03/03/20 1250 03/03/20 2144 03/04/20 0411 03/04/20 1656 03/04/20 1938 03/04/20 2155 03/05/20 0441  WBC 14.2*  --   --   --  9.9  --  10.4  CREATININE 1.42* 1.11 0.96 1.00  --   --  0.89  LATICACIDVEN  --   --   --   --  2.0* 1.0  --     Estimated Creatinine Clearance: 76.2 mL/min (by C-G formula based on SCr of 0.89 mg/dL).    No Known Allergies  Antimicrobials this admission:   >>    >>   Dose adjustments this admission:   Microbiology results:  BCx:   UCx:    Sputum:    MRSA PCR:   Thank you for allowing pharmacy to be a part of this patient's care.  03/07/20 A 03/05/2020 6:35 AM

## 2020-03-05 NOTE — Evaluation (Signed)
Physical Therapy Evaluation Patient Details Name: Spencer [name] MRN: [id number] DOB: 01/08/1961 Today's Date: 03/05/2020   History of Present Illness  Pt admitted for complaints of fall while replacing showerhead in bathroom and laid in floor x 3 days. HIstory includes DM, cocaine abuse, brain aneurysm, HTN, and GERD. Recent admission for similar symptoms. Imaging negative at this time. Now admitted for DKA.  Clinical Impression  Pt is a pleasant 59 year old male who was admitted for DKA. Pt heavily guarded due to pain in neck/hip. For further sessions, need to coordinate therapy with pain med schedule. Pt reports he has been waiting for 2 diet cokes for over an hour. Brought these to patient and he agreed to work with therapy. Pt performs bed mobility with min assist (mainly due to pain) and refused further mobility/transfers at this time. Pt has adequate strength and should be mobilized when pain under control. Pt reports severe pain in neck, has difficulty moving head to drink from cup. Pt demonstrates deficits with pain/mobility. Reports this is his only fall, however he was unable to get up off floor for a few days and is primarily limited by pain at this time. Would benefit from skilled PT to address above deficits and promote optimal return to PLOF. Recommend transition to [place] upon discharge from acute hospitalization.     Follow Up Recommendations Home health PT;Supervision for mobility/OOB    Equipment Recommendations       Recommendations for Other Services       Precautions / Restrictions Precautions Precautions: Fall Restrictions Weight Bearing Restrictions: No      Mobility  Bed Mobility Overal bed mobility: Needs Assistance Bed Mobility: Supine to Sit     Supine to sit: Min assist     General bed mobility comments: needs assist for guidance of R LE. ONce seated, able to sit with upright posture. Able to sit for several minutes while drinking soda, able to maintain  upright posture. Assisted back to bed  Transfers                 General transfer comment: refused to attempt due to pain  Ambulation/Gait             General Gait Details: refused to attempt due to pain  Stairs            Wheelchair Mobility    Modified Rankin (Stroke Patients Only)       Balance Overall balance assessment: History of Falls;Needs assistance Sitting-balance support: Single extremity supported;Feet supported Sitting balance-Leahy Scale: Good                                       Pertinent Vitals/Pain Pain Assessment: 0-10 Pain Score: 10-Worst pain ever Pain Location: post neck/head and R hip Pain Descriptors / Indicators: Aching;Discomfort;Dull Pain Intervention(s): Limited activity within patient's tolerance;Repositioned    Home Living Family/patient expects to be discharged to:: Private residence Living Arrangements: Non-relatives/Friends(pt very vague about support system) Available Help at Discharge: Friend(s)(per previous chart- has family at home PRN) Type of Home: House Home Access: Stairs to enter Entrance Stairs-Rails: Right;Left;Can reach both Entrance Stairs-Number of Steps: 6 Home Layout: One level Home Equipment: Walker - 2 wheels;Cane - single point      Prior Function Level of Independence: Independent         Comments: reports he was not having any issues with mobility. Only  fall to date.     Hand Dominance   Dominant Hand: Right    Extremity/Trunk Assessment   Upper Extremity Assessment Upper Extremity Assessment: Overall WFL for tasks assessed    Lower Extremity Assessment Lower Extremity Assessment: Overall WFL for tasks assessed(pain limited on R side)       Communication   Communication: No difficulties  Cognition Arousal/Alertness: Awake/alert Behavior During Therapy: WFL for tasks assessed/performed Overall Cognitive Status: Within Functional Limits for tasks assessed                                         General Comments      Exercises Other Exercises Other Exercises: needed assist for bringing cup up to face in addition to neck pain with turning head to swallow. Assisted with opening straw.  Other Exercises: Refused all ther-ex due to pain   Assessment/Plan    PT Assessment Patient needs continued PT services  PT Problem List Decreased balance;Decreased mobility;Decreased knowledge of use of DME;Decreased safety awareness;Pain       PT Treatment Interventions Gait training;DME instruction;Therapeutic exercise;Balance training    PT Goals (Current goals can be found in the Care Plan section)  Acute Rehab PT Goals Patient Stated Goal: to not have pain PT Goal Formulation: With patient Time For Goal Achievement: 03/19/20 Potential to Achieve Goals: Good    Frequency Min 2X/week   Barriers to discharge        Co-evaluation               AM-PAC PT "6 Clicks" Mobility  Outcome Measure Help needed turning from your back to your side while in a flat bed without using bedrails?: A Little Help needed moving from lying on your back to sitting on the side of a flat bed without using bedrails?: A Little Help needed moving to and from a bed to a chair (including a wheelchair)?: A Little Help needed standing up from a chair using your arms (e.g., wheelchair or bedside chair)?: A Little Help needed to walk in hospital room?: A Lot Help needed climbing 3-5 steps with a railing? : A Lot 6 Click Score: 16    End of Session   Activity Tolerance: Patient limited by pain Patient left: in bed Nurse Communication: Mobility status PT Visit Diagnosis: History of falling (Z91.81);Difficulty in walking, not elsewhere classified (R26.2);Pain Pain - Right/Left: Right Pain - part of body: Hip    Time: 6378-5885 PT Time Calculation (min) (ACUTE ONLY): 20 min   Charges:   PT Evaluation $PT Eval Low Complexity: 1 Low PT  Treatments $Therapeutic Activity: 8-22 mins        Elizabeth Palau, PT, DPT [phone]   Shadasia Oldfield 03/05/2020, 4:04 PM

## 2020-03-05 NOTE — TOC Initial Note (Signed)
Transition of Care [facility]) - Initial/Assessment Note    Patient Details  Name: Spencer [name] MRN: [id number] Date of Birth: Mar 18, 1961  [facility]) CM/SW Contact:    Magnus Ivan, LCSW Phone Number: 03/05/2020, 10:57 AM  Clinical Narrative:            CSW met with patient at bedside for Readmission Screening. Patient was recently hospitalized and a Readmission Screening was done on 02/16/20. Reviewed this information with patient who denied any changes to his home set up. Patient lives with his father, sister, and nephew. Transports himself to appointments. No PCP, PCP resources were provided earlier this month. [facility] [address]. Has a RW and cane, but says he does not need or use them. Patient denies [place] or SNF history. During past hospitalizations, patient has refused SA resources or did not follow up on them. MD is submitting PT/OT evals so CSW can follow up on recommendations since patient is frequently hospitalized. CSW will continue to follow.    Expected Discharge Plan: Home/Self Care Barriers to Discharge: Continued Medical Work up   Patient Goals and CMS Choice Patient states their goals for this hospitalization and ongoing recovery are:: to return home   Choice offered to / list presented to : Patient  Expected Discharge Plan and Services Expected Discharge Plan: Home/Self Care       Living arrangements for the past 2 months: Single Family Home                                      Prior Living Arrangements/Services Living arrangements for the past 2 months: Single Family Home Lives with:: Parents, Siblings, Relatives Patient language and need for interpreter reviewed:: Yes Do you feel safe going back to the place where you live?: Yes      Need for Family Participation in Patient Care: Yes (Comment) Care giver support system in place?: Yes (comment) Current home services: DME Criminal Activity/Legal Involvement Pertinent to  Current Situation/Hospitalization: No - Comment as needed  Activities of Daily Living      Permission Sought/Granted                  Emotional Assessment Appearance:: Appears stated age Attitude/Demeanor/Rapport: Lethargic Affect (typically observed): Calm, Blunt Orientation: : Oriented to Self, Oriented to Place, Oriented to  Time, Oriented to Situation Alcohol / Substance Use: Illicit Drugs Psych Involvement: No (comment)  Admission diagnosis:  DKA (diabetic ketoacidoses) ([place]) [E11.10] Fall [W19.XXXA] Diabetic ketoacidosis without coma associated with other specified diabetes mellitus ([place]) [E13.10] Patient Active Problem List   Diagnosis Date Noted  . Fever 03/04/2020  . Fall at home 03/03/2020  . Respiratory failure ([place]) 02/13/2020  . Weakness   . Lobar pneumonia ([place])   . Hyperglycemia   . Tobacco abuse counseling   . Atrial fibrillation with RVR ([place]) 08/07/2019  . GERD (gastroesophageal reflux disease) 08/07/2019  . Acute renal failure ([place])   . Hyperkalemia   . Sepsis ([place]) 06/09/2019  . AKI (acute kidney injury) ([place]) 02/19/2019  . Hypoglycemia 10/23/2018  . Chronic low back pain 01/16/2017  . Chronic neck pain 01/16/2017  . Diabetic neuropathy ([place]) 01/16/2017  . DM2 (diabetes mellitus, type 2) ([place]) 01/16/2017  . History of cocaine abuse ([place]) 01/16/2017  . Personal history of subdural hematoma 01/16/2017  . Closed displaced fracture of body of left calcaneus with delayed healing  12/13/2016  . Protein-calorie malnutrition, severe 11/08/2016  . DKA, type 2 ([place]) 11/06/2016  . DKA (diabetic ketoacidoses) ([place]) 04/03/2015  . Hypertension 04/03/2015  . Depression 04/03/2015  . Opiate dependence ([place]) 04/03/2015  . Tobacco abuse 04/03/2015  . Lumbosacral neuritis 07/19/2014  . Pain of finger of right hand 07/19/2014  . Type II or unspecified type diabetes mellitus without mention of complication, not stated as uncontrolled 07/19/2014  . Knee pain  09/12/2013  . Hernia of flank 09/20/2012  . Epidermoid cyst of skin 09/20/2012  . Chronic pain of both shoulders 03/27/2012   PCP:  Patient, No Pcp Per Pharmacy:   [facility] [place], [place] [address] AT [facility] [place] [address] Phone: [phone] Fax: [phone]     Social Determinants of Health (SDOH) Interventions    Readmission Risk Interventions Readmission Risk Prevention Plan 03/05/2020 02/16/2020 12/17/2019  Transportation Screening Complete Complete Complete  PCP or Specialist Appt within 3-5 Days - - -  [place] or [place] or Home Care Consult comments - - -  Palliative Care Screening - - -  Medication Review ([place]) Complete Complete Complete  PCP or Specialist appointment within 3-5 days of discharge Patient refused Complete Patient refused  [place] or [place] Complete Complete Patient refused  SW Recovery Care/Counseling Consult - - -  SW Consult Not Complete Comments - - -  Palliative Care Screening - - Not Applicable  Skilled Nursing Facility Complete Complete Not Applicable  Some recent data might be hidden

## 2020-03-05 NOTE — Consult Note (Signed)
NAME: Spencer [name]  DOB: 1961/06/03  MRN: [id number]  Date/Time: 03/05/2020 12:16 PM  REQUESTING PROVIDER: Dr. Myriam Forehand Subjective:  REASON FOR CONSULT: Staph bacteremia ?Patient is a poor historian.  Chart reviewed. Spencer [name] is a 59 y.o. male with a history of diabetes mellitus with recurrent DKA, recurrent hospitalization, hypertension, cocaine use, presented to the ED on 03/03/2020 with  a fall 3 days ago and lying on the floor for 3 days and then roommate called EMS.  He was complaining of right hip pain chest pain and shortness of breath.  He is a diabetic and has been off his insulin or other medications for 3 days. In the ED his vitals where 98.4 blood pressure of 139/89, heart rate of 101. WBC was 14.2, hemoglobin of 14.5, platelet of 278, Blood glucose of 428, creatinine of 1.42, CO2 of 11. Blood cultures were sent.  Had a lactate of 2 1 pro-Cal of 6.90.  CK was 77.  Urine tox screen showed cocaine positive. Blood culture came back positive for MSSA and I am seeing the patient for the same.  Patient was recently in the hospital between 02/13/2020 until 02/23/2020 for DKA, AKI, cocaine intoxication and metabolic encephalopathy and respiratory failure requiring intubation.    That time glucose on presentation was 1400 and CO2 was less than 7 with potassium of 7.4.  Patient was treated for DKA and also for aspiration pneumonia.      Past Medical History:  Diagnosis Date  . Brain aneurysm   . Broken bones    clavical, ankle, arms toes wriast   . Diabetes mellitus without complication (HCC)   . Dysrhythmia   . GERD (gastroesophageal reflux disease)   . Hypertension   . Substance abuse [facility])     Past Surgical History:  Procedure Laterality Date  . CORONARY/GRAFT ACUTE MI REVASCULARIZATION N/A 09/04/2019   Procedure: Coronary/Graft Acute MI Revascularization;  Surgeon: Alwyn Pea, MD;  Location: [facility] INVASIVE CV LAB;  Service: Cardiovascular;  Laterality: N/A;  .  ESOPHAGOGASTRODUODENOSCOPY (EGD) WITH PROPOFOL N/A 06/04/2019   Procedure: ESOPHAGOGASTRODUODENOSCOPY (EGD) WITH PROPOFOL;  Surgeon: Toledo, Boykin Nearing, MD;  Location: [facility] ENDOSCOPY;  Service: Gastroenterology;  Laterality: N/A;  . HERNIA REPAIR    . IMPLANTATION / PLACEMENT OF STRIP ELECTRODES VIA BURR HOLES SUBDURAL     anyrusum  . LEFT HEART CATH AND CORONARY ANGIOGRAPHY N/A 09/04/2019   Procedure: LEFT HEART CATH AND CORONARY ANGIOGRAPHY;  Surgeon: Alwyn Pea, MD;  Location: [facility] INVASIVE CV LAB;  Service: Cardiovascular;  Laterality: N/A;  . NECK SURGERY    . TEE WITHOUT CARDIOVERSION N/A 06/03/2019   Procedure: TRANSESOPHAGEAL ECHOCARDIOGRAM (TEE);  Surgeon: Dalia Heading, MD;  Location: [facility] ORS;  Service: Cardiovascular;  Laterality: N/A;    Social History   Socioeconomic History  . Marital status: Single    Spouse name: Not on file  . Number of children: Not on file  . Years of education: Not on file  . Highest education level: Not on file  Occupational History  . Not on file  Tobacco Use  . Smoking status: Current Some Day Smoker  . Smokeless tobacco: Never Used  Substance and Sexual Activity  . Alcohol use: Yes    Alcohol/week: 1.0 standard drinks    Types: 1 Cans of beer per week  . Drug use: Not Currently    Types: Cocaine  . Sexual activity: Not on file  Other Topics Concern  . Not on file  Social History  Narrative   ** Merged History Encounter **       Social Determinants of Health   Financial Resource Strain:   . Difficulty of Paying Living Expenses:   Food Insecurity:   . Worried About Programme researcher, broadcasting/film/video in the Last Year:   . Barista in the Last Year:   Transportation Needs:   . Freight forwarder (Medical):   Marland Kitchen Lack of Transportation (Non-Medical):   Physical Activity:   . Days of Exercise per Week:   . Minutes of Exercise per Session:   Stress:   . Feeling of Stress :   Social Connections:   . Frequency of Communication with  Friends and Family:   . Frequency of Social Gatherings with Friends and Family:   . Attends Religious Services:   . Active Member of Clubs or Organizations:   . Attends Banker Meetings:   Marland Kitchen Marital Status:   Intimate Partner Violence:   . Fear of Current or Ex-Partner:   . Emotionally Abused:   Marland Kitchen Physically Abused:   . Sexually Abused:     Family History  Problem Relation Age of Onset  . CAD Sister   . Hypertension Sister   . Healthy Mother   . Healthy Father    No Known Allergies  ? Current Facility-Administered Medications  Medication Dose Route Frequency Provider Last Rate Last Admin  . 0.9 %  sodium chloride infusion   Intravenous Continuous Lurene Shadow, MD 100 mL/hr at 03/05/20 0600 Rate Verify at 03/05/20 0600  . acetaminophen (TYLENOL) tablet 650 mg  650 mg Oral Q6H PRN Lurene Shadow, MD   650 mg at 03/04/20 2351   Or  . acetaminophen (TYLENOL) suppository 650 mg  650 mg Rectal Q6H PRN Lurene Shadow, MD      . amLODipine (NORVASC) tablet 10 mg  10 mg Oral Daily Lurene Shadow, MD   10 mg at 03/05/20 0859  . aspirin EC tablet 81 mg  81 mg Oral Daily Lurene Shadow, MD   81 mg at 03/05/20 0902  . atorvastatin (LIPITOR) tablet 40 mg  40 mg Oral q1800 Lurene Shadow, MD   40 mg at 03/04/20 1747  . ceFAZolin (ANCEF) IVPB 2g/100 mL premix  2 g Intravenous Q8H Hall, Scott A, RPH 200 mL/hr at 03/05/20 0901 2 g at 03/05/20 0901  . Chlorhexidine Gluconate Cloth 2 % PADS 6 each  6 each Topical Daily Lurene Shadow, MD   6 each at 03/04/20 [phone]  . dextrose 50 % solution 0-50 mL  0-50 mL Intravenous PRN Lurene Shadow, MD      . enoxaparin (LOVENOX) injection 40 mg  40 mg Subcutaneous Q24H Lurene Shadow, MD      . insulin aspart (novoLOG) injection 0-5 Units  0-5 Units Subcutaneous QHS Jimmye Norman, NP   4 Units at 03/04/20 2203  . insulin aspart (novoLOG) injection 0-9 Units  0-9 Units Subcutaneous TID WC Lurene Shadow, MD   5 Units at 03/05/20 0902  .  insulin aspart (novoLOG) injection 10 Units  10 Units Subcutaneous TID WC Lurene Shadow, MD      . insulin glargine (LANTUS) injection 25 Units  25 Units Subcutaneous Daily Lurene Shadow, MD      . ipratropium-albuterol (DUONEB) 0.5-2.5 (3) MG/3ML nebulizer solution 3 mL  3 mL Nebulization Q6H PRN Jimmye Norman, NP   3 mL at 03/04/20 2236  . morphine 4 MG/ML injection 4 mg  4 mg Intravenous Q6H  PRN Lurene ShadowAyiku, Bernard, MD   4 mg at 03/04/20 2220  . ondansetron (ZOFRAN) tablet 4 mg  4 mg Oral Q6H PRN Lurene ShadowAyiku, Bernard, MD       Or  . ondansetron (ZOFRAN) injection 4 mg  4 mg Intravenous Q6H PRN Lurene ShadowAyiku, Bernard, MD      . oxyCODONE (Oxy IR/ROXICODONE) immediate release tablet 5 mg  5 mg Oral Q6H PRN Lurene ShadowAyiku, Bernard, MD   5 mg at 03/05/20 0859  . pantoprazole (PROTONIX) EC tablet 40 mg  40 mg Oral Daily Lurene ShadowAyiku, Bernard, MD   40 mg at 03/05/20 0902  . traMADol (ULTRAM) tablet 50 mg  50 mg Oral Q6H PRN Lurene ShadowAyiku, Bernard, MD   50 mg at 03/05/20 0859     Abtx:  Anti-infectives (From admission, onward)   Start     Dose/Rate Route Frequency Ordered Stop   03/05/20 0800  ceFAZolin (ANCEF) IVPB 2g/100 mL premix     2 g 200 mL/hr over 30 Minutes Intravenous Every 8 hours 03/05/20 0635     03/04/20 1900  piperacillin-tazobactam (ZOSYN) IVPB 3.375 g  Status:  Discontinued     3.375 g 12.5 mL/hr over 240 Minutes Intravenous Every 8 hours 03/04/20 1858 03/05/20 0634      REVIEW OF SYSTEMS:  Patient is drowsy and lethargic and unable to give a proper review of system.  ? Objective:  VITALS:  BP 136/82   Pulse (!) 108   Temp 98.4 F (36.9 C)   Resp (!) 24   Ht 5\' 10"  (1.778 m)   Wt 61.8 kg   SpO2 92%   BMI 19.55 kg/m  PHYSICAL EXAM:  General: Lethargic, somnolent, has an neck brace Follows simple commands but does not want to talk much Back: No CVA tenderness. Lungs: Bilateral air entry Heart: S1-S2 Abdomen: Soft, non-tender,not distended. Bowel sounds normal. No masses Extremities:  atraumatic, no cyanosis. No edema. No clubbing Skin: Limited examination Some erythema around the sacral area. Lymph: Cervical, supraclavicular normal. Neurologic: Moves all extremities Pertinent Labs Lab Results CBC    Component Value Date/Time   WBC 10.4 03/05/2020 0441   RBC 4.05 (L) 03/05/2020 0441   HGB 12.6 (L) 03/05/2020 0441   HCT 36.5 (L) 03/05/2020 0441   PLT 175 03/05/2020 0441   MCV 90.1 03/05/2020 0441   MCH 31.1 03/05/2020 0441   MCHC 34.5 03/05/2020 0441   RDW 13.2 03/05/2020 0441   LYMPHSABS 0.4 (L) 03/05/2020 0441   MONOABS 0.8 03/05/2020 0441   EOSABS 0.0 03/05/2020 0441   BASOSABS 0.0 03/05/2020 0441    CMP Latest Ref Rng & Units 03/05/2020 03/04/2020 03/04/2020  Glucose 70 - 99 mg/dL 161(W229(H) 960(A389(H) 89  BUN 8 - 23 mg/dL 13 18 23   Creatinine 0.61 - 1.24 mg/dL 5.400.89 9.811.00 1.910.96  Sodium 135 - 145 mmol/L 130(L) 125(L) 131(L)  Potassium 3.5 - 5.1 mmol/L 3.9 4.4 4.5  Chloride 98 - 111 mmol/L 100 98 104  CO2 22 - 32 mmol/L 24 19(L) 18(L)  Calcium 8.9 - 10.3 mg/dL 4.7(W8.4(L) 2.9(F8.4(L) 6.2(Z8.7(L)  Total Protein 6.5 - 8.1 g/dL - - -  Total Bilirubin 0.3 - 1.2 mg/dL - - -  Alkaline Phos 38 - 126 U/L - - -  AST 15 - 41 U/L - - -  ALT 0 - 44 U/L - - -      Microbiology: Recent Results (from the past 240 hour(s))  Respiratory Panel by RT PCR (Flu A&B, Covid) - Nasopharyngeal Swab  Status: None   Collection Time: 03/03/20 12:50 PM   Specimen: Nasopharyngeal Swab  Result Value Ref Range Status   SARS Coronavirus 2 by RT PCR NEGATIVE NEGATIVE Final    Comment: (NOTE) SARS-CoV-2 target nucleic acids are NOT DETECTED. The SARS-CoV-2 RNA is generally detectable in upper respiratoy specimens during the acute phase of infection. The lowest concentration of SARS-CoV-2 viral copies this assay can detect is 131 copies/mL. A negative result does not preclude SARS-Cov-2 infection and should not be used as the sole basis for treatment or other patient management decisions. A negative  result may occur with  improper specimen collection/handling, submission of specimen other than nasopharyngeal swab, presence of viral mutation(s) within the areas targeted by this assay, and inadequate number of viral copies (<131 copies/mL). A negative result must be combined with clinical observations, patient history, and epidemiological information. The expected result is Negative. Fact Sheet for Patients:  PinkCheek.be Fact Sheet for Healthcare Providers:  GravelBags.it This test is not yet ap proved or cleared by the [place] FDA and  has been authorized for detection and/or diagnosis of SARS-CoV-2 by FDA under an Emergency Use Authorization (EUA). This EUA will remain  in effect (meaning this test can be used) for the duration of the COVID-19 declaration under Section 564(b)(1) of the Act, 21 U.S.C. section 360bbb-3(b)(1), unless the authorization is terminated or revoked sooner.    Influenza A by PCR NEGATIVE NEGATIVE Final   Influenza B by PCR NEGATIVE NEGATIVE Final    Comment: (NOTE) The Xpert Xpress SARS-CoV-2/FLU/RSV assay is intended as an aid in  the diagnosis of influenza from Nasopharyngeal swab specimens and  should not be used as a sole basis for treatment. Nasal washings and  aspirates are unacceptable for Xpert Xpress SARS-CoV-2/FLU/RSV  testing. Fact Sheet for Patients: PinkCheek.be Fact Sheet for Healthcare Providers: GravelBags.it This test is not yet approved or cleared by the [place] FDA and  has been authorized for detection and/or diagnosis of SARS-CoV-2 by  FDA under an Emergency Use Authorization (EUA). This EUA will remain  in effect (meaning this test can be used) for the duration of the  Covid-19 declaration under Section 564(b)(1) of the Act, 21  U.S.C. section 360bbb-3(b)(1), unless the authorization is  terminated or  revoked. Performed at [facility], [address] [place], [place] [address]   CULTURE, BLOOD (ROUTINE X 2) w Reflex to ID Panel     Status: None (Preliminary result)   Collection Time: 03/04/20  7:37 PM   Specimen: BLOOD  Result Value Ref Range Status   Specimen Description BLOOD BLOOD LEFT HAND  Final   Special Requests   Final    BOTTLES DRAWN AEROBIC AND ANAEROBIC Blood Culture adequate volume   Culture  Setup Time   Final    GRAM POSITIVE COCCI IN BOTH AEROBIC AND ANAEROBIC BOTTLES CRITICAL RESULT CALLED TO, READ BACK BY AND VERIFIED WITH: SCOTT HALL AT 0612 03/05/20.PMF Performed at [facility], [address] [place], [place] [address]    Culture [facility] POSITIVE COCCI  Final   Report Status PENDING  Incomplete  CULTURE, BLOOD (ROUTINE X 2) w Reflex to ID Panel     Status: None (Preliminary result)   Collection Time: 03/04/20  7:41 PM   Specimen: BLOOD  Result Value Ref Range Status   Specimen Description BLOOD RIGHT ANTECUBITAL  Final   Special Requests   Final    BOTTLES DRAWN AEROBIC AND ANAEROBIC Blood Culture adequate volume  Culture  Setup Time   Final    Organism ID to follow GRAM POSITIVE COCCI IN BOTH AEROBIC AND ANAEROBIC BOTTLES CRITICAL RESULT CALLED TO, READ BACK BY AND VERIFIED WITH: SCOTT HALL AT 0612 03/05/20.PMF Performed at [facility], [address] [place], [place] [address]    Culture [facility] POSITIVE COCCI  Final   Report Status PENDING  Incomplete  Blood Culture ID Panel (Reflexed)     Status: Abnormal   Collection Time: 03/04/20  7:41 PM  Result Value Ref Range Status   Enterococcus species NOT DETECTED NOT DETECTED Final   Listeria monocytogenes NOT DETECTED NOT DETECTED Final   Staphylococcus species DETECTED (A) NOT DETECTED Final    Comment: CRITICAL RESULT CALLED TO, READ BACK BY AND VERIFIED WITH: SCOTT HALL AT 0612 03/05/20.PMF    Staphylococcus aureus (BCID) DETECTED (A) NOT DETECTED Final    Comment:  Methicillin (oxacillin) susceptible Staphylococcus aureus (MSSA). Preferred therapy is anti staphylococcal beta lactam antibiotic (Cefazolin or Nafcillin), unless clinically contraindicated. CRITICAL RESULT CALLED TO, READ BACK BY AND VERIFIED WITH: SCOTT HALL AT 0612 03/05/20.PMF    Methicillin resistance NOT DETECTED NOT DETECTED Final   Streptococcus species NOT DETECTED NOT DETECTED Final   Streptococcus agalactiae NOT DETECTED NOT DETECTED Final   Streptococcus pneumoniae NOT DETECTED NOT DETECTED Final   Streptococcus pyogenes NOT DETECTED NOT DETECTED Final   Acinetobacter baumannii NOT DETECTED NOT DETECTED Final   Enterobacteriaceae species NOT DETECTED NOT DETECTED Final   Enterobacter cloacae complex NOT DETECTED NOT DETECTED Final   Escherichia coli NOT DETECTED NOT DETECTED Final   Klebsiella oxytoca NOT DETECTED NOT DETECTED Final   Klebsiella pneumoniae NOT DETECTED NOT DETECTED Final   Proteus species NOT DETECTED NOT DETECTED Final   Serratia marcescens NOT DETECTED NOT DETECTED Final   Haemophilus influenzae NOT DETECTED NOT DETECTED Final   Neisseria meningitidis NOT DETECTED NOT DETECTED Final   Pseudomonas aeruginosa NOT DETECTED NOT DETECTED Final   Candida albicans NOT DETECTED NOT DETECTED Final   Candida glabrata NOT DETECTED NOT DETECTED Final   Candida krusei NOT DETECTED NOT DETECTED Final   Candida parapsilosis NOT DETECTED NOT DETECTED Final   Candida tropicalis NOT DETECTED NOT DETECTED Final    Comment: Performed at [facility], [address] [place], [place] [address]    IMAGING RESULTS:  I have personally reviewed the films ? Impression/Recommendation ?  Fall  Staph aureus bacteremia in a patient with DKA and cocaine use.  Recently hospitalized. Will need 2D echo and TEE to rule out endocarditis. No hardware or cardiac device Continue cefazolin Repeat blood cultures ? DKA Being corrected Poorly compliant with medication.   Hemoglobin A1c on 02/15/2020 was 12.6.  Is getting Lantus  _Displaced fracture of C4 vertebral body.  Patient has a cervical collar.  No surgical intervention as per neurosurgery  Acute kidney injury better  History of brain aneurysm.  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  Hypertension on amlodipine  Cocaine use     ID will follow him peripherally this weekend call if needed.  _  Note:  This document was prepared using Dragon voice recognition software and may include unintentional dictation errors.

## 2020-03-05 NOTE — Progress Notes (Signed)
*  PRELIMINARY RESULTS* Echocardiogram 2D Echocardiogram has been performed.  Spencer [name] Juste 03/05/2020, 1:38 PM

## 2020-03-05 NOTE — Progress Notes (Signed)
Inpatient Diabetes Program Recommendations  AACE/ADA: New Consensus Statement on Inpatient Glycemic Control (2015)  Target Ranges:  Prepandial:   less than 140 mg/dL      Peak postprandial:   less than 180 mg/dL (1-2 hours)      Critically ill patients:  140 - 180 mg/dL   Lab Results  Component Value Date   GLUCAP 299 (H) 03/05/2020   HGBA1C 12.6 (H) 02/15/2020    Review of Glycemic Control  Results for JESTEN, CAPPUCCIO (MRN [id number]) as of 03/05/2020 08:16  Ref. Range 03/04/2020 13:25 03/04/2020 16:25 03/04/2020 18:27 03/04/2020 21:30 03/05/2020 07:20  Glucose-Capillary Latest Ref Range: 70 - 99 mg/dL 444 (H) 584 (H) 835 (H) 309 (H) 299 (H)    Diabetes history: DM2  Outpatient Diabetes medications:  Novolog 8 units TID with meals + Lantus 18 units daily  Current orders for Inpatient glycemic control: Novolog 0-9 tid + 0-5 qhs + Lantus 20 units daily  Inpatient Diabetes Program Recommendations:     Lantus 25 units daily  Novolog 0-15 TID  Novolog 10 units TID with meals  Thank you, Dulce Sellar, RN, BSN Diabetes Coordinator Inpatient Diabetes Program [phone] (team pager from 8a-5p)

## 2020-03-05 NOTE — Progress Notes (Signed)
OT Cancellation Note  Patient Details Name: BERLIN MOKRY MRN: [id number] DOB: 10-02-1961   Cancelled Treatment:    Reason Eval/Treat Not Completed: Other (comment). Consult received, chart reviewed. Pt pending cervical spinal CT. Will hold OT evaluation and re-attempt at later date/time as pt is medically appropriate and updated POC.   Richrd Prime, MPH, MS, OTR/L ascom [phone] 03/05/20, 1:15 PM
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
## 2020-03-05 NOTE — Progress Notes (Signed)
PHARMACY - PHYSICIAN COMMUNICATION CRITICAL VALUE ALERT - BLOOD CULTURE IDENTIFICATION (BCID)  [name] is an 59 y.o. male who presented to [facility] on 03/03/2020 with a chief complaint of bacteremia  Assessment:  Lab reports 4 of 4 bottles with GPC, staph aureus, MecA not detected  Name of physician (or Provider) Contacted: Reyes Ivan NP  Current antibiotics: Zosyn  Changes to prescribed antibiotics recommended: change to Ancef 2gm IV q8hrs Recommendations accepted by provider  Results for orders placed or performed during the hospital encounter of 03/03/20  Blood Culture ID Panel (Reflexed) (Collected: 03/04/2020  7:41 PM)  Result Value Ref Range   Enterococcus species NOT DETECTED NOT DETECTED   Listeria monocytogenes NOT DETECTED NOT DETECTED   Staphylococcus species DETECTED (A) NOT DETECTED   Staphylococcus aureus (BCID) DETECTED (A) NOT DETECTED   Methicillin resistance NOT DETECTED NOT DETECTED   Streptococcus species NOT DETECTED NOT DETECTED   Streptococcus agalactiae NOT DETECTED NOT DETECTED   Streptococcus pneumoniae NOT DETECTED NOT DETECTED   Streptococcus pyogenes NOT DETECTED NOT DETECTED   Acinetobacter baumannii NOT DETECTED NOT DETECTED   Enterobacteriaceae species NOT DETECTED NOT DETECTED   Enterobacter cloacae complex NOT DETECTED NOT DETECTED   Escherichia coli NOT DETECTED NOT DETECTED   Klebsiella oxytoca NOT DETECTED NOT DETECTED   Klebsiella pneumoniae NOT DETECTED NOT DETECTED   Proteus species NOT DETECTED NOT DETECTED   Serratia marcescens NOT DETECTED NOT DETECTED   Haemophilus influenzae NOT DETECTED NOT DETECTED   Neisseria meningitidis NOT DETECTED NOT DETECTED   Pseudomonas aeruginosa NOT DETECTED NOT DETECTED   Candida albicans NOT DETECTED NOT DETECTED   Candida glabrata NOT DETECTED NOT DETECTED   Candida krusei NOT DETECTED NOT DETECTED   Candida parapsilosis NOT DETECTED NOT DETECTED   Candida tropicalis NOT DETECTED NOT DETECTED     Valrie Hart A 03/05/2020  6:31 AM

## 2020-03-06 DIAGNOSIS — A4101 Sepsis due to Methicillin susceptible Staphylococcus aureus: Secondary | ICD-10-CM

## 2020-03-06 DIAGNOSIS — S12300D Unspecified displaced fracture of fourth cervical vertebra, subsequent encounter for fracture with routine healing: Secondary | ICD-10-CM

## 2020-03-06 DIAGNOSIS — L8990 Pressure ulcer of unspecified site, unspecified stage: Secondary | ICD-10-CM | POA: Insufficient documentation

## 2020-03-06 LAB — PROCALCITONIN: Procalcitonin: 11.63 ng/mL

## 2020-03-06 LAB — BASIC METABOLIC PANEL
Anion gap: 10 (ref 5–15)
BUN: 16 mg/dL (ref 8–23)
CO2: 25 mmol/L (ref 22–32)
Calcium: 8.7 mg/dL — ABNORMAL LOW (ref 8.9–10.3)
Chloride: 97 mmol/L — ABNORMAL LOW (ref 98–111)
Creatinine, Ser: 0.85 mg/dL (ref 0.61–1.24)
GFR calc Af Amer: >60 mL/min (ref >60)
GFR calc non Af Amer: >60 mL/min (ref >60)
Glucose, Bld: 165 mg/dL — ABNORMAL HIGH (ref 70–99)
Potassium: 3.9 mmol/L (ref 3.5–5.1)
Sodium: 132 mmol/L — ABNORMAL LOW (ref 135–145)

## 2020-03-06 LAB — GLUCOSE, CAPILLARY
Glucose-Capillary: 169 mg/dL — ABNORMAL HIGH (ref 70–99)
Glucose-Capillary: 224 mg/dL — ABNORMAL HIGH (ref 70–99)
Glucose-Capillary: 108 mg/dL — ABNORMAL HIGH (ref 70–99)

## 2020-03-06 LAB — ECHOCARDIOGRAM COMPLETE
Height: 70 in
Weight: 2179.91 oz

## 2020-03-06 MED ORDER — SODIUM CHLORIDE 0.9 % IV SOLN
INTRAVENOUS | Status: DC | PRN
  Administered 2020-03-06 – 2020-03-07 (×2): 250 mL via INTRAVENOUS

## 2020-03-06 MED ORDER — INSULIN ASPART 100 UNIT/ML ~~LOC~~ SOLN
7.0000 [IU] | Freq: Once | SUBCUTANEOUS | Status: DC
  Filled 2020-03-06: qty 1

## 2020-03-06 NOTE — Consult Note (Signed)
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 Valve: The pulmonic valve was normal in structure. Pulmonic valve regurgitation is trivial. No evidence of pulmonic stenosis. Aorta: Root is Ectatic. There is borderline dilatation of the aortic root measuring 39 mm. Venous: The inferior vena cava is normal in size with greater than 50% respiratory variability, suggesting right atrial pressure of 3 mmHg. IAS/Shunts: No atrial level shunt detected by color flow Doppler.  LEFT VENTRICLE PLAX 2D LVIDd:         4.72 cm  Diastology LVIDs:         3.23 cm  LV e' lateral:   8.81 cm/s LV PW:         1.39 cm  LV E/e' lateral: 8.2 LV IVS:        1.17 cm  LV e' medial:    5.87 cm/s LVOT diam:     2.30 cm  LV E/e' medial:  12.3 LVOT Area:     4.15 cm  RIGHT VENTRICLE RV Mid diam:    4.24 cm LEFT ATRIUM             Index LA diam:        3.90 cm 2.20 cm/m LA Vol (A2C):   46.9 ml 26.47 ml/m LA Vol (A4C):   36.4 ml 20.54 ml/m LA Biplane Vol: 42.5 ml 23.99 ml/m   AORTA Ao Root diam: 3.90 cm MITRAL VALVE MV Area (PHT): 3.05 cm    SHUNTS MV Decel Time: 249 msec    Systemic Diam: 2.30 cm MV E velocity: 72.20 cm/s MV A velocity: 91.90 cm/s MV E/A ratio:  0.79 Kate Sable MD Electronically signed by Kate Sable MD Signature Date/Time: 02/15/2020/2:07:36 PM    Final    DG HIP UNILAT W OR W/O PELVIS 2-3 VIEWS RIGHT  Result Date: 03/03/2020 CLINICAL DATA:  Pain following fall EXAM: DG HIP (WITH OR WITHOUT  PELVIS) 2-3V RIGHT COMPARISON:  November 05, 2016 pelvis radiograph FINDINGS: Frontal pelvis  as well as frontal and lateral right hip images obtained. No fracture or dislocation. There is mild symmetric narrowing of each hip joint. No erosive change. There is a stable sclerotic focus in the lateral right femoral neck measuring 1.5 x 1.5 cm. IMPRESSION: No fracture or dislocation. Slight symmetric narrowing of each hip joint. Stable sclerotic focus in the right femoral neck which potentially may represent a bone island. Stability since 2017 is felt to be indicative of benign etiology. Electronically Signed   By: Bretta Bang III M.D.   On: 03/03/2020 13:35    Wt Readings from Last 3 Encounters:  03/03/20 61.8 kg  02/16/20 61.8 kg  12/15/19 65.5 kg    EKG: Normal sinus rhythm  Physical Exam:  Blood pressure (!) 144/88, pulse 94, temperature 98 F (36.7 C), temperature source Oral, resp. rate 17, height 5\' 10"  (1.778 m), weight 61.8 kg, SpO2 95 %. Body mass index is 19.55 kg/m. General: Well developed, well nourished, in no acute distress. Head: Normocephalic, atraumatic, sclera non-icteric, no xanthomas, nares are without discharge.  Neck: Negative for carotid bruits. JVD not elevated. Lungs: Clear bilaterally to auscultation without wheezes, rales, or rhonchi. Breathing is unlabored. Heart: RRR with S1 S2. No murmurs, rubs, or gallops appreciated. Abdomen: Soft, non-tender, non-distended with normoactive bowel sounds. No hepatomegaly. No rebound/guarding. No obvious abdominal masses. Msk:  Strength and tone appear normal for age. Extremities: No clubbing or cyanosis. No edema.  Distal pedal pulses are 2+ and equal bilaterally. Neuro: Responsive but somewhat inappropriately.  Has a cervical collar hard collar in place.      Assessment and Plan  Patient with history of cocaine abuse, DKA and recent fall with a C4 anterior cervical fracture with a neck collar in place now with MSSA and  blood culture.  Asked to do transesophageal echo.  Will need cervical spine clearance prior to this.  Very high risk to do this procedure with a new cervical fracture.  Will recheck tox screen tomorrow to determine if there is any further cocaine use.  Again, TEE very high risk with his cervical fracture.  Transthoracic echo did not show evidence of endocarditis.  Signed, MD 03/06/2020, 10:44 AM Pager: [phone]

## 2020-03-06 NOTE — Evaluation (Signed)
Occupational Therapy Evaluation Patient Details Name: Spencer [name] MRN: [id number] DOB: 1961/05/19 Today's Date: 03/06/2020    History of Present Illness Pt admitted for complaints of fall while replacing showerhead in bathroom and laid in floor x 3 days. HIstory includes DM, cocaine abuse, brain aneurysm, HTN, and GERD. Recent admission for similar symptoms. Imaging negative at this time. Now admitted for DKA.   Clinical Impression   Spencer [name] was seen for OT evaluation this date, limited by 10/10 neck pain. Upon entry to room, pt observed reclined in bed c c-collar off. OT educated pt on importance of collar during mobility. MOD A don hard c-collar at bed level. MIN A self-feeding at bed level, pt deferred further ADL assessment or mobility 2/2 pain - RN notified. Pt presents to acute OT demonstrating impaired ADL performance and functional mobility 2/2 pain, decreased safety awareness, and functional balance/endurance deficits. Pt would benefit from skilled OT to address noted impairments and functional limitations (see below for any additional details) in order to maximize safety and independence while minimizing falls risk and caregiver burden. Upon hospital discharge, recommend HHOT to maximize pt safety and return to functional independence during meaningful occupations of daily life.     Follow Up Recommendations  Home health OT;Supervision/Assistance - 24 hour    Equipment Recommendations       Recommendations for Other Services       Precautions / Restrictions Precautions Precautions: Fall Required Braces or Orthoses: Cervical Brace Cervical Brace: Hard collar Restrictions Weight Bearing Restrictions: No      Mobility Bed Mobility Overal bed mobility: Needs Assistance             General bed mobility comments: Pt declined mobility 2/2 neck pain 10/10, RN notified  Transfers                      Balance                                            ADL either performed or assessed with clinical judgement   ADL Overall ADL's : Needs assistance/impaired                                       General ADL Comments: MOD A don hard collar - MAX VCs to initiate donning. MIN A  self-feeding     Vision         Perception     Praxis      Pertinent Vitals/Pain Pain Assessment: 0-10 Pain Score: 10-Worst pain ever Pain Location: post neck/head and R hip Pain Descriptors / Indicators: Aching;Discomfort;Dull Pain Intervention(s): Limited activity within patient's tolerance;Repositioned;Patient requesting pain meds-RN notified     Hand Dominance Right   Extremity/Trunk Assessment Upper Extremity Assessment Upper Extremity Assessment: Overall WFL for tasks assessed(B grip 4/5)   Lower Extremity Assessment Lower Extremity Assessment: Overall WFL for tasks assessed       Communication Communication Communication: No difficulties   Cognition Arousal/Alertness: Awake/alert(eyes closed t/o majority of session) Behavior During Therapy: WFL for tasks assessed/performed Overall Cognitive Status: Within Functional Limits for tasks assessed  General Comments: Pt had eyes closed and less responsive to questions t/o session- suspect 2/2 pain   General Comments       Exercises Exercises: Other exercises Other Exercises Other Exercises: Pt educated re: OT role, importance of mobility for pain management and functional strengthening, importance of wearing c-collar,  Other Exercises: Self-feeding, don c-collar   Shoulder Instructions      Home Living Family/patient expects to be discharged to:: Private residence Living Arrangements: Non-relatives/Friends Available Help at Discharge: Friend(s)(conflicting information - may have roommmate or family ) Type of Home: House Home Access: Stairs to enter [employer] of Steps: 6 Entrance Stairs-Rails:  Right;Left;Can reach both Home Layout: One level     Bathroom Shower/Tub: Tub/shower unit         Home Equipment: Environmental consultant - 2 wheels;Cane - single point          Prior Functioning/Environment Level of Independence: Independent        Comments: Pt reports Independent c mobility and ADLs        OT Problem List: Decreased strength;Decreased range of motion;Decreased activity tolerance;Decreased safety awareness;Decreased knowledge of use of DME or AE;Decreased knowledge of precautions      OT Treatment/Interventions: Self-care/ADL training;Therapeutic exercise;Neuromuscular education;Energy conservation;DME and/or AE instruction;Therapeutic activities;Patient/family education;Balance training    OT Goals(Current goals can be found in the care plan section) Acute Rehab OT Goals Patient Stated Goal: to not have pain OT Goal Formulation: With patient Time For Goal Achievement: 03/20/20 Potential to Achieve Goals: Fair ADL Goals Pt Will Perform Grooming: with set-up;sitting(c no cues to maintain c-spine precautions) Pt Will Transfer to Toilet: with set-up;with supervision(bed level) Additional ADL Goal #1: Pt will independently verablize x3 falls prevention strategies  OT Frequency: Min 2X/week   Barriers to D/C: Inaccessible home environment;Decreased caregiver support          Co-evaluation              AM-PAC OT "6 Clicks" Daily Activity     Outcome Measure Help from another person eating meals?: A Little Help from another person taking care of personal grooming?: A Little Help from another person toileting, which includes using toliet, bedpan, or urinal?: A Lot Help from another person bathing (including washing, rinsing, drying)?: A Lot Help from another person to put on and taking off regular upper body clothing?: A Little Help from another person to put on and taking off regular lower body clothing?: A Lot 6 Click Score: 15   End of Session Equipment  Utilized During Treatment: Cervical collar Nurse Communication: Patient requests pain meds  Activity Tolerance: Patient limited by pain Patient left: in bed;with call bell/phone within reach;with bed alarm set  OT Visit Diagnosis: Unsteadiness on feet (R26.81);Other abnormalities of gait and mobility (R26.89)                Time: 3846-6599 OT Time Calculation (min): 9 min Charges:  OT General Charges $OT Visit: 1 Visit OT Evaluation $OT Eval Low Complexity: 1 Low  Kathie Dike, M.S. OTR/L  03/06/20, 3:50 PM

## 2020-03-06 NOTE — Progress Notes (Signed)
Notified Dr Myriam Forehand of [facility] 108, 10 units Novolog due.  Per MD to order 7 units of novolog once and to give if meal intake >50%.

## 2020-03-06 NOTE — Progress Notes (Addendum)
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        0.99 cm  LV e' medial:    7.72 cm/s LVOT diam:     2.30 cm  LV E/e' medial:  8.9 LV SV:         72 LV SV Index:   41 LVOT Area:     4.15 cm  RIGHT VENTRICLE RV Basal diam:  2.78 cm LEFT ATRIUM             Index       RIGHT ATRIUM           Index LA diam:        3.10 cm 1.75 cm/m  RA Area:     13.90 cm LA Vol (A2C):   32.9 ml 18.55 ml/m RA Volume:   31.30 ml  17.65 ml/m LA Vol (A4C):   27.7 ml 15.62 ml/m LA Biplane Vol: 32.5 ml 18.33 ml/m  AORTIC VALVE                   PULMONIC VALVE AV Area (Vmax):    3.24 cm    PV Vmax:       1.25 m/s AV Area (Vmean):   2.99 cm    PV Vmean:      84.900 cm/s AV Area (VTI):      3.21 cm    PV VTI:        0.211 m AV Vmax:           146.00 cm/s PV Peak grad:  6.2 mmHg AV Vmean:          93.400 cm/s PV Mean grad:  3.0 mmHg AV VTI:            0.225 m AV Peak Grad:      8.5 mmHg AV Mean Grad:      4.0 mmHg LVOT Vmax:         114.00 cm/s LVOT Vmean:        67.300 cm/s LVOT VTI:          0.174 m LVOT/AV VTI ratio: 0.77  AORTA Ao Root diam: 3.70 cm MITRAL VALVE MV Area (PHT): 5.38 cm    SHUNTS MV Peak grad:  4.4 mmHg    Systemic VTI:  0.17 m MV Mean grad:  2.0  mmHg    Systemic Diam: 2.30 cm MV Vmax:       1.05 m/s MV Vmean:      58.9 cm/s MV Decel Time: 141 msec MV E velocity: 68.40 cm/s MV A velocity: 91.70 cm/s MV E/A ratio:  0.75 Harold Hedge MD Electronically signed by Harold Hedge MD Signature Date/Time: 03/06/2020/8:03:57 AM    Final     Medications:   . amLODipine  10 mg Oral Daily  . aspirin EC  81 mg Oral Daily  . atorvastatin  40 mg Oral q1800  . Chlorhexidine Gluconate Cloth  6 each Topical Daily  . enoxaparin (LOVENOX) injection  40 mg Subcutaneous Q24H  . insulin aspart  0-5 Units Subcutaneous QHS  . insulin aspart  0-9 Units Subcutaneous TID WC  . insulin aspart  10 Units Subcutaneous TID WC  . insulin glargine  25 Units Subcutaneous Daily  . losartan  50 mg Oral Daily  . pantoprazole  40 mg Oral Daily   Continuous Infusions: .  ceFAZolin (ANCEF) IV 2 g (03/06/20 0835)     LOS: 3 days   Faiga Stones  [facility]     03/06/2020, 12:27 PM

## 2020-03-06 NOTE — Plan of Care (Signed)
Patient transferred today from ICU.  On skin assessment noted pressure ulcers to medial buttocks and R elbow.  Foam dressing applied. Dr.Ayiku made aware and placed wound consult order. Repositioned qx2hrs.  CBG 108 at dinner time.  Meal intake <50%. Insulin not given.  Dilaudid given once for R hip pain with improvement.

## 2020-03-07 LAB — GLUCOSE, CAPILLARY
Glucose-Capillary: 217 mg/dL — ABNORMAL HIGH (ref 70–99)
Glucose-Capillary: 207 mg/dL — ABNORMAL HIGH (ref 70–99)
Glucose-Capillary: 231 mg/dL — ABNORMAL HIGH (ref 70–99)
Glucose-Capillary: 274 mg/dL — ABNORMAL HIGH (ref 70–99)
Glucose-Capillary: 219 mg/dL — ABNORMAL HIGH (ref 70–99)
Glucose-Capillary: 144 mg/dL — ABNORMAL HIGH (ref 70–99)

## 2020-03-07 LAB — CULTURE, BLOOD (ROUTINE X 2)
Special Requests: ADEQUATE
Special Requests: ADEQUATE

## 2020-03-07 LAB — BASIC METABOLIC PANEL
Anion gap: 7 (ref 5–15)
BUN: 13 mg/dL (ref 8–23)
CO2: 26 mmol/L (ref 22–32)
Calcium: 8.2 mg/dL — ABNORMAL LOW (ref 8.9–10.3)
Chloride: 97 mmol/L — ABNORMAL LOW (ref 98–111)
Creatinine, Ser: 0.72 mg/dL (ref 0.61–1.24)
GFR calc Af Amer: >60 mL/min (ref >60)
GFR calc non Af Amer: >60 mL/min (ref >60)
Glucose, Bld: 208 mg/dL — ABNORMAL HIGH (ref 70–99)
Potassium: 3.4 mmol/L — ABNORMAL LOW (ref 3.5–5.1)
Sodium: 130 mmol/L — ABNORMAL LOW (ref 135–145)

## 2020-03-07 LAB — MAGNESIUM: Magnesium: 2 mg/dL (ref 1.7–2.4)

## 2020-03-07 MED ORDER — POTASSIUM CHLORIDE 20 MEQ PO PACK
40.0000 meq | PACK | Freq: Two times a day (BID) | ORAL | Status: AC
  Administered 2020-03-07 (×2): 40 meq via ORAL
  Filled 2020-03-07 (×2): qty 2

## 2020-03-07 NOTE — Progress Notes (Signed)
Patient noted with difficulty swallowing and tolerating po intake. Recent hx of C4 anterior cervical fracture with a neck collar in place. Per RN patient coughs when he eats or drinks. Given decreased neck mobility from fracture, he is at high risk for aspiration and would benefit from speech consult.   Webb Silversmith, DNP, CCRN, FNP-C [facility] Nurse Practitioner  [facility]

## 2020-03-07 NOTE — TOC Progression Note (Addendum)
Transition of Care [facility]) - Progression Note    Patient Details  Name: Spencer [name] MRN: [id number] Date of Birth: 06-Aug-1961  Transition of Care [facility]) CM/SW Contact  Maud Deed, LCSW Phone Number: 03/07/2020, 11:29 AM  Clinical Narrative:    CSW was contacted by MD to speak with pt's sister about discharge plan. CSW contacted pt's sister, sister wanted to know about a nursing facility for the pt. CSW explained that the PT and OT recommendations were for [facility]. Pt's sister was frustrated and stated "what can you do to help him because [place] and can not take care of himself". CSW offered to provide her with a list of inpatient rehab facilities for SA and notified her that they would have to get it setup. Pt's sister immediately got upset that CSW could not arrange inpatient rehab. Pt has previously been given list of resources for PCP and rehab but refuses to follow up once discharged. Pt's sister stated "I will be up there later and we will see what happens then". CSW notified MD of conversation with pt's sister and MD stated that he gave her the same information.   Expected Discharge Plan: Home/Self Care Barriers to Discharge: Continued Medical Work up  Expected Discharge Plan and Services Expected Discharge Plan: Home/Self Care       Living arrangements for the past 2 months: Single Family Home                                       Social Determinants of Health (SDOH) Interventions    Readmission Risk Interventions Readmission Risk Prevention Plan 03/05/2020 02/16/2020 12/17/2019  Transportation Screening Complete Complete Complete  PCP or Specialist Appt within 3-5 Days - - -  HRI or Home Care Consult - - -  HRI or Home Care Consult comments - - -  Palliative Care Screening - - -  Medication Review (RN Care Manager) Complete Complete Complete  PCP or Specialist appointment within 3-5 days of discharge Patient refused Complete Patient refused  HRI or Home  Care Consult Complete Complete Patient refused  SW Recovery Care/Counseling Consult - - -  SW Consult Not Complete Comments - - -  Palliative Care Screening - - Not Applicable  Skilled Nursing Facility Complete Complete Not Applicable  Some recent data might be hidden

## 2020-03-07 NOTE — Progress Notes (Signed)
Patient's temperature 100.4, 650mg  tylenol given, temp down to 100.2. Discussed with on call provider NP, advised to monitor. Patient is refusing neck brace as well. Patient is coughing after eating/ drinking. Advised to allow day hospitalist to assess patient in the morning from provider EO, NP.

## 2020-03-07 NOTE — Plan of Care (Signed)

## 2020-03-07 NOTE — Progress Notes (Addendum)
Progress Note    Spencer [name]  ZOX:[id number] DOB: [birth year]  DOA: 03/03/2020 PCP: Patient, No Pcp Per      Brief Narrative:    Medical records reviewed and are as summarized below:  Spencer [name] is an 59 y.o. male with medical history significant for type 2 diabetes mellitus, cocaine abuse, brain aneurysm, hypertension, GERD, recent discharge from the hospital on 02/17/2020 after hospitalization for DKA, AKI, cocaine intoxication, metabolic encephalopathy and respiratory failure requiring intubation.  He presented to the hospital after a fall at home. He said he fell 2 days ago when he was trying to place a new showerhead in his bathroom. He stood on the edge of the bathtub but unfortunately he slipped and fell while attempting to place a new showerhead. He was unable to get up after he fell. He laid on the floor for 2 days. He said he could not eat nor drink and he could not take any of his medicines including insulin. He said he has a roommate but the roommate could not help him. He said his roommate cannot even help himself. He complained of pain in his right hip that developed after the fall. He also complained of thirst, hunger, dry throat and generalized weakness      Assessment/Plan:   Principal Problem:   DKA (diabetic ketoacidoses) (HCC) Active Problems:   Hypertension   AKI (acute kidney injury) (HCC)   Fall at home   Fever   Staphylococcus aureus bacteremia with sepsis (HCC)   C4 cervical fracture (HCC)   Pressure injury of skin   DKA in a patient with type II DM: off of IV insulin infusion since 03/04/2020.  Continue Lantus and NovoLog.  Hemoglobin A1c was 12.6 on 02/15/2020.  MSSA sepsis and bacteremia: Continue IV Ancef.  No vegetations on 2D echo.  Consulted cardiologist for TEE.  Per cardiologist, patient is at very high risk for TEE procedure because of cervical fracture.  Neurosurgeon will have to weigh in on this.  No coverage for neurosurgery on weekend so  we will consult neurosurgeon tomorrow.  Follow-up with ID and cardiologist.  Grade 1 diastolic dysfunction and EF of 60 to 65% on 2D echo.  Asymptomatic.  Displaced fracture of C4 vertebral body: Consulted neurosurgeon, Dr. Myer Haff.  Case was discussed with him on 03/05/2020 at 3:55 PM.  He said this was a stable fracture and it only involved osteophyte of C4.  Conservative management with Aspen collar and pain control was recommended.  The importance of wearing the cervical collar was reiterated.   Dysphagia/coughing spells while eating: Speech therapist has been consulted for swallow evaluation.  Hyponatremia: Asymptomatic.  Stable.  Continue to monitor.  Right hip pain, s/p fall at home: No acute findings on x-ray and CT right hip. CK level was normal.  PT and OT recommend home health therapy.  However his sister, [name], wants him to be discharged to SNF because of poor living conditions.  Follow-up with social Investment banker, operational to assist with disposition.  Hypertension: Continue amlodipine and losartan  AKI and leukocytosis: Resolved  Cocaine abuse: Counseled to quit.  Decubitus ulcers-stage II on right elbow, stage III on coccygeal area (midline to the left side) and stage II on right coccygeal area: Of note all these were present on admission.  Patient is reluctant to return even though he can turn on his own.  He uses curse words, profane or obscene language when he is being turned.  Continue local  wound care.  He has been encouraged to turn from side-to-side.  Consult wound care nurse.   Pressure Injury 02/14/20 Buttocks Mid;Upper Deep Tissue Pressure Injury - Purple or maroon localized area of discolored intact skin or blood-filled blister due to damage of underlying soft tissue from pressure and/or shear. intact blister to scarum betwe (Active)  02/14/20 0845  Location: Buttocks  Location Orientation: Mid;Upper  Staging: Deep Tissue Pressure Injury - Purple or maroon  localized area of discolored intact skin or blood-filled blister due to damage of underlying soft tissue from pressure and/or shear.  Wound Description (Comments): intact blister to scarum between buttocks, surrounding skin purple in color  Present on Admission:      Pressure Injury 03/06/20 Buttocks Medial;Left Stage 3 -  Full thickness tissue loss. Subcutaneous fat may be visible but bone, tendon or muscle are NOT exposed. (Active)  03/06/20 1147  Location: Buttocks (Lateral to coccyx)  Location Orientation: Medial;Left  Staging: Stage 3 -  Full thickness tissue loss. Subcutaneous fat may be visible but bone, tendon or muscle are NOT exposed.  Wound Description (Comments):   Present on Admission: -- (Present on transfer from ICU)     Pressure Injury 03/06/20 Coccyx Right Stage 2 -  Partial thickness loss of dermis presenting as a shallow open injury with a red, pink wound bed without slough. (Active)  03/06/20 1147  Location: Coccyx  Location Orientation: Right  Staging: Stage 2 -  Partial thickness loss of dermis presenting as a shallow open injury with a red, pink wound bed without slough.  Wound Description (Comments):   Present on Admission: -- (Present on transfer from ICU)     Pressure Injury 03/06/20 Elbow Right Stage 2 -  Partial thickness loss of dermis presenting as a shallow open injury with a red, pink wound bed without slough. (Active)  03/06/20 1147  Location: Elbow  Location Orientation: Right  Staging: Stage 2 -  Partial thickness loss of dermis presenting as a shallow open injury with a red, pink wound bed without slough.  Wound Description (Comments):   Present on Admission: -- (Present on transfer from ICU)          Body mass index is 19.55 kg/m.   Family Communication/Anticipated D/C date and plan/Code Status   DVT prophylaxis: Lovenox Code Status: Full code Family Communication: Plan discussed with patient's sister [name]. Disposition Plan:     Status is: Inpatient  Remains inpatient appropriate because:Inpatient level of care appropriate due to severity of illness   Dispo: The patient is from: Home              Anticipated d/c is to: Home              Anticipated d/c date is: 2 days              Patient currently is not medically stable to d/c.            Subjective:   Right hip pain is improving.  Neck pain is also better.  He had a low-grade fever overnight.  Objective:    Vitals:   03/06/20 2017 03/07/20 0000 03/07/20 0119 03/07/20 0737  BP: 132/81 135/82  128/90  Pulse: 91 91  88  Resp: Temp: 100.1 F (37.8 C) (!) 100.4 F (38 C) 100.2 F (37.9 C) 98.5 F (36.9 C)  TempSrc: Oral Oral Oral Oral  SpO2: 94% 94%  95%  Weight:  Height:       No data found.   Intake/Output Summary (Last 24 hours) at 03/07/2020 1100 Last data filed at 03/07/2020 1019 Gross per 24 hour  Intake 771.7 ml  Output 1550 ml  Net -778.3 ml   Filed Weights   03/03/20 1240  Weight: 61.8 kg    Exam:  GEN: NAD SKIN: No rash. Abrasion/stage 2 decubitus ulcer on right elbow. Stage 2 midline sacral decubitus ulcer, stage 2 right buttock decubitus ulcer (all present on admission) EYES: EOMI ENT: MMM, cervical spine tenderness CV: RRR PULM: No wheezing or rales heard ABD: soft, ND, NT, +BS CNS: AAO x 3, non focal EXT: Right hip tenderness . Swelling in the right hip has improved.  No leg edema or tenderness.   Data Reviewed:   I have personally reviewed following labs and imaging studies:  Labs: Labs show the following:   Basic Metabolic Panel: Recent Labs  Lab 03/03/20 2144 03/03/20 2144 03/04/20 0411 03/04/20 0411 03/04/20 1656 03/04/20 1656 03/05/20 0441 03/05/20 0441 03/06/20 0341 03/07/20 0428  NA 132*   < > 131*  --  125*  --  130*  --  132* 130*  K 4.9   < > 4.5   < > 4.4   < > 3.9   < > 3.9 3.4*  CL 103   < > 104  --  98  --  100  --  97* 97*  CO2 13*   < > 18*  --  19*  --   24  --  25 26  GLUCOSE 212*   < > 89  --  389*  --  229*  --  165* 208*  BUN 22   < > 23  --  18  --  13  --  16 13  CREATININE 1.11   < > 0.96  --  1.00  --  0.89  --  0.85 0.72  CALCIUM 8.8*   < > 8.7*  --  8.4*  --  8.4*  --  8.7* 8.2*  MG 2.1  --   --   --   --   --   --   --   --   --    < > = values in this interval not displayed.   GFR Estimated Creatinine Clearance: 84.8 mL/min (by C-G formula based on SCr of 0.72 mg/dL). Liver Function Tests: Recent Labs  Lab 03/03/20 1250  AST 15  ALT 20  ALKPHOS 86  BILITOT 2.3*  PROT 7.6  ALBUMIN 3.7   No results for input(s): LIPASE, AMYLASE in the last 168 hours. No results for input(s): AMMONIA in the last 168 hours. Coagulation profile No results for input(s): INR, PROTIME in the last 168 hours.  CBC: Recent Labs  Lab 03/03/20 1250 03/04/20 1938 03/05/20 0441  WBC 14.2* 9.9 10.4  NEUTROABS  --  8.6* 9.1*  HGB 14.5 11.8* 12.6*  HCT 44.8 35.0* 36.5*  MCV 94.9 92.8 90.1  PLT 278 202 175   Cardiac Enzymes: Recent Labs  Lab 03/03/20 1249  CKTOTAL 77   BNP (last 3 results) No results for input(s): PROBNP in the last 8760 hours. CBG: Recent Labs  Lab 03/06/20 1147 03/06/20 1703 03/07/20 0120 03/07/20 0439 03/07/20 0804  GLUCAP 224* 108* 217* 207* 231*   D-Dimer: No results for input(s): DDIMER in the last 72 hours. Hgb A1c: No results for input(s): HGBA1C in the last 72 hours. Lipid Profile: No results for input(s):  CHOL, HDL, LDLCALC, TRIG, CHOLHDL, LDLDIRECT in the last 72 hours. Thyroid function studies: No results for input(s): TSH, T4TOTAL, T3FREE, THYROIDAB in the last 72 hours.  Invalid input(s): FREET3 Anemia work up: No results for input(s): VITAMINB12, FOLATE, FERRITIN, TIBC, IRON, RETICCTPCT in the last 72 hours. Sepsis Labs: Recent Labs  Lab 03/03/20 1250 03/04/20 1938 03/04/20 2155 03/05/20 0441 03/06/20 0341  PROCALCITON  --  6.98  --  6.21 11.63  WBC 14.2* 9.9  --  10.4  --    LATICACIDVEN  --  2.0* 1.0  --   --     Microbiology Recent Results (from the past 240 hour(s))  Respiratory Panel by RT PCR (Flu A&B, Covid) - Nasopharyngeal Swab     Status: None   Collection Time: 03/03/20 12:50 PM   Specimen: Nasopharyngeal Swab  Result Value Ref Range Status   SARS Coronavirus 2 by RT PCR NEGATIVE NEGATIVE Final    Comment: (NOTE) SARS-CoV-2 target nucleic acids are NOT DETECTED. The SARS-CoV-2 RNA is generally detectable in upper respiratoy specimens during the acute phase of infection. The lowest concentration of SARS-CoV-2 viral copies this assay can detect is 131 copies/mL. A negative result does not preclude SARS-Cov-2 infection and should not be used as the sole basis for treatment or other patient management decisions. A negative result may occur with  improper specimen collection/handling, submission of specimen other than nasopharyngeal swab, presence of viral mutation(s) within the areas targeted by this assay, and inadequate number of viral copies (<131 copies/mL). A negative result must be combined with clinical observations, patient history, and epidemiological information. The expected result is Negative. Fact Sheet for Patients:  https://www.moore.com/ Fact Sheet for Healthcare Providers:  https://www.young.biz/ This test is not yet ap proved or cleared by the [place] FDA and  has been authorized for detection and/or diagnosis of SARS-CoV-2 by FDA under an Emergency Use Authorization (EUA). This EUA will remain  in effect (meaning this test can be used) for the duration of the COVID-19 declaration under Section 564(b)(1) of the Act, 21 U.S.C. section 360bbb-3(b)(1), unless the authorization is terminated or revoked sooner.    Influenza A by PCR NEGATIVE NEGATIVE Final   Influenza B by PCR NEGATIVE NEGATIVE Final    Comment: (NOTE) The Xpert Xpress SARS-CoV-2/FLU/RSV assay is intended as an aid in   the diagnosis of influenza from Nasopharyngeal swab specimens and  should not be used as a sole basis for treatment. Nasal washings and  aspirates are unacceptable for Xpert Xpress SARS-CoV-2/FLU/RSV  testing. Fact Sheet for Patients: https://www.moore.com/ Fact Sheet for Healthcare Providers: https://www.young.biz/ This test is not yet approved or cleared by the [place] FDA and  has been authorized for detection and/or diagnosis of SARS-CoV-2 by  FDA under an Emergency Use Authorization (EUA). This EUA will remain  in effect (meaning this test can be used) for the duration of the  Covid-19 declaration under Section 564(b)(1) of the Act, 21  U.S.C. section 360bbb-3(b)(1), unless the authorization is  terminated or revoked. Performed at [facility], [address] [place], [place] [address]   CULTURE, BLOOD (ROUTINE X 2) w Reflex to ID Panel     Status: Abnormal   Collection Time: 03/04/20  7:37 PM   Specimen: BLOOD  Result Value Ref Range Status   Specimen Description   Final    BLOOD BLOOD LEFT HAND Performed at [facility], [address] [place], [place] [address]    Special Requests   Final  BOTTLES DRAWN AEROBIC AND ANAEROBIC Blood Culture adequate volume Performed at [facility], [address] [place], [place] [address]    Culture  Setup Time   Final    GRAM POSITIVE COCCI IN BOTH AEROBIC AND ANAEROBIC BOTTLES CRITICAL RESULT CALLED TO, READ BACK BY AND VERIFIED WITH: SCOTT HALL AT 0612 03/05/20.PMF Performed at [facility], [address] [place], [place] [address]    Culture (A)  Final    STAPHYLOCOCCUS AUREUS SUSCEPTIBILITIES PERFORMED ON PREVIOUS CULTURE WITHIN THE LAST 5 DAYS. Performed at [name], [address] [place], [place] [address]    Report Status 03/07/2020 FINAL  Final  CULTURE, BLOOD (ROUTINE X 2) w Reflex to ID Panel     Status: Abnormal    Collection Time: 03/04/20  7:41 PM   Specimen: BLOOD  Result Value Ref Range Status   Specimen Description   Final    BLOOD RIGHT ANTECUBITAL Performed at [facility], [address] [place], [place] [address]    Special Requests   Final    BOTTLES DRAWN AEROBIC AND ANAEROBIC Blood Culture adequate volume Performed at [facility], [address] [place], [place] [address]    Culture  Setup Time   Final    GRAM POSITIVE COCCI IN BOTH AEROBIC AND ANAEROBIC BOTTLES CRITICAL RESULT CALLED TO, READ BACK BY AND VERIFIED WITH: SCOTT HALL AT 0612 03/05/20.PMF Performed at [facility], [address] [place], [place] [address]    Culture STAPHYLOCOCCUS AUREUS (A)  Final   Report Status 03/07/2020 FINAL  Final   Organism ID, Bacteria STAPHYLOCOCCUS AUREUS  Final      Susceptibility   Staphylococcus aureus - MIC*    CIPROFLOXACIN <=0.5 SENSITIVE Sensitive     ERYTHROMYCIN RESISTANT Resistant     GENTAMICIN <=0.5 SENSITIVE Sensitive     OXACILLIN 0.5 SENSITIVE Sensitive     TETRACYCLINE <=1 SENSITIVE Sensitive     VANCOMYCIN <=0.5 SENSITIVE Sensitive     TRIMETH/SULFA <=10 SENSITIVE Sensitive     CLINDAMYCIN RESISTANT Resistant     RIFAMPIN <=0.5 SENSITIVE Sensitive     Inducible Clindamycin POSITIVE Resistant     * STAPHYLOCOCCUS AUREUS  Blood Culture ID Panel (Reflexed)     Status: Abnormal   Collection Time: 03/04/20  7:41 PM  Result Value Ref Range Status   Enterococcus species NOT DETECTED NOT DETECTED Final   Listeria monocytogenes NOT DETECTED NOT DETECTED Final   Staphylococcus species DETECTED (A) NOT DETECTED Final    Comment: CRITICAL RESULT CALLED TO, READ BACK BY AND VERIFIED WITH: SCOTT HALL AT 0612 03/05/20.PMF    Staphylococcus aureus (BCID) DETECTED (A) NOT DETECTED Final    Comment: Methicillin (oxacillin) susceptible Staphylococcus aureus (MSSA). Preferred therapy is anti staphylococcal beta lactam antibiotic (Cefazolin or Nafcillin),  unless clinically contraindicated. CRITICAL RESULT CALLED TO, READ BACK BY AND VERIFIED WITH: SCOTT HALL AT 0612 03/05/20.PMF    Methicillin resistance NOT DETECTED NOT DETECTED Final   Streptococcus species NOT DETECTED NOT DETECTED Final   Streptococcus agalactiae NOT DETECTED NOT DETECTED Final   Streptococcus pneumoniae NOT DETECTED NOT DETECTED Final   Streptococcus pyogenes NOT DETECTED NOT DETECTED Final   Acinetobacter baumannii NOT DETECTED NOT DETECTED Final   Enterobacteriaceae species NOT DETECTED NOT DETECTED Final   Enterobacter cloacae complex NOT DETECTED NOT DETECTED Final   Escherichia coli NOT DETECTED NOT DETECTED Final   Klebsiella oxytoca NOT DETECTED NOT DETECTED Final   Klebsiella pneumoniae NOT DETECTED  NOT DETECTED Final   Proteus species NOT DETECTED NOT DETECTED Final   Serratia marcescens NOT DETECTED NOT DETECTED Final   Haemophilus influenzae NOT DETECTED NOT DETECTED Final   Neisseria meningitidis NOT DETECTED NOT DETECTED Final   Pseudomonas aeruginosa NOT DETECTED NOT DETECTED Final   Candida albicans NOT DETECTED NOT DETECTED Final   Candida glabrata NOT DETECTED NOT DETECTED Final   Candida krusei NOT DETECTED NOT DETECTED Final   Candida parapsilosis NOT DETECTED NOT DETECTED Final   Candida tropicalis NOT DETECTED NOT DETECTED Final    Comment: Performed at [facility], [address] [place], [place] [address]  Culture, blood (Routine X 2) w Reflex to ID Panel     Status: None (Preliminary result)   Collection Time: 03/06/20 12:11 AM   Specimen: BLOOD  Result Value Ref Range Status   Specimen Description BLOOD BLOOD LEFT HAND  Final   Special Requests   Final    BOTTLES DRAWN AEROBIC AND ANAEROBIC Blood Culture adequate volume   Culture   Final    NO GROWTH 1 DAY Performed at [facility], [address] [place], [place] [address]    Report Status PENDING  Incomplete  Culture, blood (Routine X 2) w Reflex to ID Panel      Status: None (Preliminary result)   Collection Time: 03/06/20  3:41 AM   Specimen: BLOOD  Result Value Ref Range Status   Specimen Description BLOOD RIGHT ANTECUBITAL  Final   Special Requests   Final    BOTTLES DRAWN AEROBIC AND ANAEROBIC Blood Culture adequate volume   Culture   Final    NO GROWTH 1 DAY Performed at [facility], [address] [place], [place] [address]    Report Status PENDING  Incomplete    Procedures and diagnostic studies:  CT CERVICAL SPINE WO CONTRAST  Result Date: 03/05/2020 CLINICAL DATA:  Acute neck pain. Recent fall. EXAM: CT CERVICAL SPINE WITHOUT CONTRAST TECHNIQUE: Multidetector CT imaging of the cervical spine was performed without intravenous contrast. Multiplanar CT image reconstructions were also generated. COMPARISON:  Cervical spine CT dated 08/19/2019. FINDINGS: Alignment: Alignment is stable. No evidence of acute vertebral body subluxation. Skull base and vertebrae: New minimally displaced fracture at the anterior margin of the C4 vertebral body, LEFT of midline (coronal series 7, image 30; axial series 2, images 46 and 47). No other fracture line or displaced fracture fragment identified. Fixation hardware at the C5 through C7 levels appears intact and appropriately positioned. Facet joints are normally aligned. Soft tissues and spinal canal: There is prevertebral fluid which is new since previous CT of 08/19/2019. No canal hematoma. Disc levels: Expected osseous fusion at the C5 through C7 levels. Remaining disc spaces of the cervical spine appear well maintained. Moderate central canal stenoses at the surgical levels due to posterior disc-osteophytic ossification. Additional degenerative hypertrophy of the uncovertebral and facet joints causing moderate to severe LEFT neural foramen stenosis at C3-4, moderate bilateral neural foramen stenosis at C4-5, moderate to severe bilateral neural foramen stenoses at C5-6 and moderate bilateral neural  foramen stenoses at C6-7. There may be associated nerve root impingement at multiple levels. Upper chest: No acute findings. Other: Bilateral carotid atherosclerosis. IMPRESSION: 1. New minimally displaced fracture at the anterior margin of the C4 vertebral body, LEFT of midline. There is no fracture extension into the posterior vertebral body suggesting a stable fracture. 2. Prevertebral fluid which is new since previous CT of 08/19/2019, almost certainly related to the  new fracture at the anterior margin of the C4 vertebral body, and suspicious for associated injury/tear of the anterior longitudinal ligament. 3. Expected osseous fusion at the C5 through C7 levels. 4. Degenerative changes as detailed above. There may be associated nerve root impingement at multiple levels. 5. Bilateral carotid atherosclerosis. These results will be called to the ordering clinician or representative by the Radiologist Assistant, and communication documented in the PACS or [employer]. Electronically Signed   By: Bary Richard M.D.   On: 03/05/2020 14:35   ECHOCARDIOGRAM COMPLETE  Result Date: 03/06/2020    ECHOCARDIOGRAM REPORT   Patient Name:   Spencer [name] Date of Exam: 03/05/2020 Medical Rec #:  [id number]    Height:       70.0 in Accession #:    [id number]   Weight:       136.2 lb Date of Birth:  1961-01-11     BSA:          1.773 m Patient Age:    59 years     BP:           136/82 mmHg Patient Gender: M            HR:           97 bpm. Exam Location:  [facility] Procedure: 2D Echo, Color Doppler and Cardiac Doppler Indications:     R78.81 Bacteremia  History:         Patient has prior history of Echocardiogram examinations, most                  recent 02/15/2020. Risk Factors:Hypertension and Diabetes.                  Substance abuse.  Sonographer:     Humphrey Rolls RDCS (AE) Referring Phys:  [id number] Lurene Shadow Diagnosing Phys: Harold Hedge MD IMPRESSIONS  1. Left ventricular ejection fraction, by estimation, is 60 to 65%. The  left ventricle has normal function. The left ventricle has no regional wall motion abnormalities. There is mild left ventricular hypertrophy. Left ventricular diastolic parameters are consistent with Grade I diastolic dysfunction (impaired relaxation).  2. Right ventricular systolic function is normal. The right ventricular size is normal.  3. The mitral valve is grossly normal. Trivial mitral valve regurgitation.  4. The aortic valve is grossly normal. Aortic valve regurgitation is not visualized.  5. Aortic dilatation noted. There is borderline dilatation of the aortic root measuring 37 mm. FINDINGS  Left Ventricle: Left ventricular ejection fraction, by estimation, is 60 to 65%. The left ventricle has normal function. The left ventricle has no regional wall motion abnormalities. The left ventricular internal cavity size was normal in size. There is  mild left ventricular hypertrophy. Left ventricular diastolic parameters are consistent with Grade I diastolic dysfunction (impaired relaxation). Right Ventricle: The right ventricular size is normal. No increase in right ventricular wall thickness. Right ventricular systolic function is normal. Left Atrium: Left atrial size was normal in size. Right Atrium: Right atrial size was normal in size. Pericardium: There is no evidence of pericardial effusion. Mitral Valve: The mitral valve is grossly normal. Trivial mitral valve regurgitation. MV peak gradient, 4.4 mmHg. The mean mitral valve gradient is 2.0 mmHg. Tricuspid Valve: The tricuspid valve is grossly normal. Tricuspid valve regurgitation is trivial. Aortic Valve: The aortic valve is grossly normal. Aortic valve regurgitation is not visualized. Aortic valve mean gradient measures 4.0 mmHg. Aortic valve peak gradient measures 8.5 mmHg. Aortic valve area,  by VTI measures 3.21 cm. Pulmonic Valve: The pulmonic valve was not well visualized. Pulmonic valve regurgitation is trivial. Aorta: Aortic dilatation noted.  There is borderline dilatation of the aortic root measuring 37 mm. IAS/Shunts: The interatrial septum was not assessed.  LEFT VENTRICLE PLAX 2D LVIDd:         4.78 cm  Diastology LVIDs:         3.06 cm  LV e' lateral:   11.10 cm/s LV PW:         1.08 cm  LV E/e' lateral: 6.2 LV IVS:        0.99 cm  LV e' medial:    7.72 cm/s LVOT diam:     2.30 cm  LV E/e' medial:  8.9 LV SV:         72 LV SV Index:   41 LVOT Area:     4.15 cm  RIGHT VENTRICLE RV Basal diam:  2.78 cm LEFT ATRIUM             Index       RIGHT ATRIUM           Index LA diam:        3.10 cm 1.75 cm/m  RA Area:     13.90 cm LA Vol (A2C):   32.9 ml 18.55 ml/m RA Volume:   31.30 ml  17.65 ml/m LA Vol (A4C):   27.7 ml 15.62 ml/m LA Biplane Vol: 32.5 ml 18.33 ml/m  AORTIC VALVE                   PULMONIC VALVE AV Area (Vmax):    3.24 cm    PV Vmax:       1.25 m/s AV Area (Vmean):   2.99 cm    PV Vmean:      84.900 cm/s AV Area (VTI):     3.21 cm    PV VTI:        0.211 m AV Vmax:           146.00 cm/s PV Peak grad:  6.2 mmHg AV Vmean:          93.400 cm/s PV Mean grad:  3.0 mmHg AV VTI:            0.225 m AV Peak Grad:      8.5 mmHg AV Mean Grad:      4.0 mmHg LVOT Vmax:         114.00 cm/s LVOT Vmean:        67.300 cm/s LVOT VTI:          0.174 m LVOT/AV VTI ratio: 0.77  AORTA Ao Root diam: 3.70 cm MITRAL VALVE MV Area (PHT): 5.38 cm    SHUNTS MV Peak grad:  4.4 mmHg    Systemic VTI:  0.17 m MV Mean grad:  2.0 mmHg    Systemic Diam: 2.30 cm MV Vmax:       1.05 m/s MV Vmean:      58.9 cm/s MV Decel Time: 141 msec MV E velocity: 68.40 cm/s MV A velocity: 91.70 cm/s MV E/A ratio:  0.75 Harold Hedge MD Electronically signed by Harold Hedge MD Signature Date/Time: 03/06/2020/8:03:57 AM    Final     Medications:   . amLODipine  10 mg Oral Daily  . aspirin EC  81 mg Oral Daily  . atorvastatin  40 mg Oral q1800  . Chlorhexidine Gluconate Cloth  6 each Topical Daily  . enoxaparin (LOVENOX)  injection  40 mg Subcutaneous Q24H  . insulin aspart  0-5  Units Subcutaneous QHS  . insulin aspart  0-9 Units Subcutaneous TID WC  . insulin aspart  10 Units Subcutaneous TID WC  . insulin aspart  7 Units Subcutaneous Once  . insulin glargine  25 Units Subcutaneous Daily  . losartan  50 mg Oral Daily  . pantoprazole  40 mg Oral Daily   Continuous Infusions: . sodium chloride Stopped (03/06/20 2035)  .  ceFAZolin (ANCEF) IV 2 g (03/07/20 0341)     LOS: 4 days   Spencer [name]  [facility]     03/07/2020, 11:00 AM

## 2020-03-08 LAB — GLUCOSE, CAPILLARY
Glucose-Capillary: 235 mg/dL — ABNORMAL HIGH (ref 70–99)
Glucose-Capillary: 196 mg/dL — ABNORMAL HIGH (ref 70–99)
Glucose-Capillary: 81 mg/dL (ref 70–99)
Glucose-Capillary: 166 mg/dL — ABNORMAL HIGH (ref 70–99)

## 2020-03-08 LAB — BASIC METABOLIC PANEL WITH GFR
Anion gap: 6 (ref 5–15)
BUN: 15 mg/dL (ref 8–23)
CO2: 26 mmol/L (ref 22–32)
Calcium: 8.4 mg/dL — ABNORMAL LOW (ref 8.9–10.3)
Chloride: 100 mmol/L (ref 98–111)
Creatinine, Ser: 0.61 mg/dL (ref 0.61–1.24)
GFR calc Af Amer: >60 mL/min
GFR calc non Af Amer: >60 mL/min
Glucose, Bld: 195 mg/dL — ABNORMAL HIGH (ref 70–99)
Potassium: 3.7 mmol/L (ref 3.5–5.1)
Sodium: 132 mmol/L — ABNORMAL LOW (ref 135–145)

## 2020-03-08 MED ORDER — COLLAGENASE 250 UNIT/GM EX OINT
TOPICAL_OINTMENT | Freq: Every day | CUTANEOUS | Status: DC
  Filled 2020-03-08: qty 30

## 2020-03-08 MED ORDER — SENNOSIDES-DOCUSATE SODIUM 8.6-50 MG PO TABS: 1.0000 | ORAL_TABLET | Freq: Every day | ORAL | Status: DC

## 2020-03-08 MED ORDER — POLYETHYLENE GLYCOL 3350 17 G PO PACK
17.0000 g | PACK | Freq: Every day | ORAL | Status: DC
  Administered 2020-03-08 – 2020-03-09 (×2): 17 g via ORAL
  Filled 2020-03-08 (×2): qty 1

## 2020-03-08 NOTE — Progress Notes (Signed)
Progress Note    Spencer [name]  EVO:[id number] DOB: 1960-12-10  DOA: 03/03/2020 PCP: Patient, No Pcp Per      Brief Narrative:    Medical records reviewed and are as summarized below:  Spencer [name] is an 59 y.o. male with medical history significant for type 2 diabetes mellitus, cocaine abuse, brain aneurysm, hypertension, GERD, recent discharge from the hospital on 02/17/2020 after hospitalization for DKA, AKI, cocaine intoxication, metabolic encephalopathy and respiratory failure requiring intubation.  He presented to the hospital after a fall at home. He said he fell 2 days ago when he was trying to place a new showerhead in his bathroom. He stood on the edge of the bathtub but unfortunately he slipped and fell while attempting to place a new showerhead. He was unable to get up after he fell. He laid on the floor for 2 days. He said he could not eat nor drink and he could not take any of his medicines including insulin. He said he has a roommate but the roommate could not help him. He said his roommate cannot even help himself. He complained of pain in his right hip that developed after the fall. He also complained of thirst, hunger, dry throat and generalized weakness      Assessment/Plan:   Principal Problem:   DKA (diabetic ketoacidoses) (HCC) Active Problems:   Hypertension   AKI (acute kidney injury) (HCC)   Fall at home   Fever   Staphylococcus aureus bacteremia with sepsis (HCC)   C4 cervical fracture (HCC)   Pressure injury of skin   DKA in a patient with type II DM: off of IV insulin infusion since 03/04/2020.  Continue Lantus and NovoLog.  Hemoglobin A1c was 12.6 on 02/15/2020.  MSSA sepsis and bacteremia: Continue IV Ancef.  No vegetations on 2D echo.  Consulted cardiologist for TEE.  Per cardiologist, patient is at very high risk for TEE procedure because of cervical fracture.  Neurosurgeon, Dr. Marcell Barlow has been consulted for clearance for TEE.  Follow-up with ID  and cardiologist.  Grade 1 diastolic dysfunction and EF of 60 to 65% on 2D echo.  Asymptomatic.  Mild and stable but displaced fracture of C4 vertebral body: Conservative management with Aspen collar and pain control was recommended by neurosurgeon..  The importance of wearing the cervical collar was reiterated.  Dr. Marcell Barlow was consulted again today for clearance for TEE.  Dysphagia/coughing spells while eating: Speech therapist has been consulted for swallow evaluation.  Hyponatremia: Asymptomatic.  Stable.  Continue to monitor.  Right hip pain, s/p fall at home: No acute findings on x-ray and CT right hip. CK level was normal.  PT and OT recommend home health therapy.  However his sister, [name], wants him to be discharged to SNF because of poor living conditions.  Follow-up with social Investment banker, operational to assist with disposition.  Hypertension: Continue amlodipine and losartan  AKI and leukocytosis: Resolved  Cocaine abuse: Counseled to quit.  Decubitus ulcers- unstageable decubitus ulcer on right elbow. unstageable midline sacral decubitus ulcer, unstageable right buttock decubitus ulcer (all present on admission).  Patient has been evaluated by wound care nurse.  Continue local wound care.  He has been encouraged to turn from side-to-side.   Pressure Injury 03/06/20 Sacrum Medial Unstageable - Full thickness tissue loss in which the base of the injury is covered by slough (yellow, tan, gray, green or brown) and/or eschar (tan, brown or black) in the wound bed. pt states this  occurred when he (Active)  03/06/20 1147  Location: Sacrum (Lateral to coccyx)  Location Orientation: Medial  Staging: Unstageable - Full thickness tissue loss in which the base of the injury is covered by slough (yellow, tan, gray, green or brown) and/or eschar (tan, brown or black) in the wound bed.  Wound Description (Comments): pt states this occurred when he fell at home  Present on Admission: Yes  (Present on transfer from ICU)     Pressure Injury 03/06/20 Elbow Right Unstageable - Full thickness tissue loss in which the base of the injury is covered by slough (yellow, tan, gray, green or brown) and/or eschar (tan, brown or black) in the wound bed. pt states this occurred when he f (Active)  03/06/20 1147  Location: Elbow  Location Orientation: Right  Staging: Unstageable - Full thickness tissue loss in which the base of the injury is covered by slough (yellow, tan, gray, green or brown) and/or eschar (tan, brown or black) in the wound bed.  Wound Description (Comments): pt states this occurred when he fell at home  Present on Admission: Yes (Present on transfer from ICU)             Body mass index is 19.55 kg/m.   Family Communication/Anticipated D/C date and plan/Code Status   DVT prophylaxis: Lovenox Code Status: Full code Family Communication: Plan discussed with patient's sister [name]. Disposition Plan:    Status is: Inpatient  Remains inpatient appropriate because:Inpatient level of care appropriate due to severity of illness   Dispo: The patient is from: Home              Anticipated d/c is to: Home              Anticipated d/c date is: 2 days              Patient currently is not medically stable to d/c.            Subjective:   He still complains of pain in the right hip and back of the neck.  No other complaints.  Objective:    Vitals:   03/07/20 1610 03/07/20 2117 03/07/20 2314 03/08/20 0839  BP: 111/73 106/79 123/78 (!) 144/90  Pulse: 87 91 92 94  Resp: 20 16 16 18   Temp: 98.4 F (36.9 C) 100.2 F (37.9 C) 98.7 F (37.1 C)   TempSrc: Oral Oral Oral   SpO2: 98% 93% 94% 98%  Weight:      Height:       No data found.   Intake/Output Summary (Last 24 hours) at 03/08/2020 1143 Last data filed at 03/08/2020 1012 Gross per 24 hour  Intake 240 ml  Output 425 ml  Net -185 ml   Filed Weights   03/03/20 1240  Weight: 61.8 kg     Exam:  GEN: NAD SKIN: No rash. unstageable decubitus ulcer on right elbow. unstageable midline sacral decubitus ulcer, unstageable right buttock decubitus ulcer (all present on admission) EYES: EOMI ENT: MMM, cervical spine tenderness CV: RRR PULM: No wheezing or rales heard ABD: soft, ND, NT, +BS CNS: AAO x 3, non focal EXT: Right hip tenderness and swelling (slowly improving).  No leg edema or tenderness.   Data Reviewed:   I have personally reviewed following labs and imaging studies:  Labs: Labs show the following:   Basic Metabolic Panel: Recent Labs  Lab 03/03/20 2144 03/04/20 0411 03/04/20 1656 03/04/20 1656 03/05/20 0441 03/05/20 0441 03/06/20 0341 03/06/20 0341 03/07/20 05/07/20  03/08/20 0437  NA 132*   < > 125*  --  130*  --  132*  --  130* 132*  K 4.9   < > 4.4   < > 3.9   < > 3.9   < > 3.4* 3.7  CL 103   < > 98  --  100  --  97*  --  97* 100  CO2 13*   < > 19*  --  24  --  25  --  26 26  GLUCOSE 212*   < > 389*  --  229*  --  165*  --  208* 195*  BUN 22   < > 18  --  13  --  16  --  13 15  CREATININE 1.11   < > 1.00  --  0.89  --  0.85  --  0.72 0.61  CALCIUM 8.8*   < > 8.4*  --  8.4*  --  8.7*  --  8.2* 8.4*  MG 2.1  --   --   --   --   --   --   --  2.0  --    < > = values in this interval not displayed.   GFR Estimated Creatinine Clearance: 84.8 mL/min (by C-G formula based on SCr of 0.61 mg/dL). Liver Function Tests: Recent Labs  Lab 03/03/20 1250  AST 15  ALT 20  ALKPHOS 86  BILITOT 2.3*  PROT 7.6  ALBUMIN 3.7   No results for input(s): LIPASE, AMYLASE in the last 168 hours. No results for input(s): AMMONIA in the last 168 hours. Coagulation profile No results for input(s): INR, PROTIME in the last 168 hours.  CBC: Recent Labs  Lab 03/03/20 1250 03/04/20 1938 03/05/20 0441  WBC 14.2* 9.9 10.4  NEUTROABS  --  8.6* 9.1*  HGB 14.5 11.8* 12.6*  HCT 44.8 35.0* 36.5*  MCV 94.9 92.8 90.1  PLT 278 202 175   Cardiac Enzymes: Recent  Labs  Lab 03/03/20 1249  CKTOTAL 77   BNP (last 3 results) No results for input(s): PROBNP in the last 8760 hours. CBG: Recent Labs  Lab 03/07/20 1138 03/07/20 1718 03/07/20 2031 03/08/20 0742 03/08/20 1136  GLUCAP 274* 219* 144* 235* 196*   D-Dimer: No results for input(s): DDIMER in the last 72 hours. Hgb A1c: No results for input(s): HGBA1C in the last 72 hours. Lipid Profile: No results for input(s): CHOL, HDL, LDLCALC, TRIG, CHOLHDL, LDLDIRECT in the last 72 hours. Thyroid function studies: No results for input(s): TSH, T4TOTAL, T3FREE, THYROIDAB in the last 72 hours.  Invalid input(s): FREET3 Anemia work up: No results for input(s): VITAMINB12, FOLATE, FERRITIN, TIBC, IRON, RETICCTPCT in the last 72 hours. Sepsis Labs: Recent Labs  Lab 03/03/20 1250 03/04/20 1938 03/04/20 2155 03/05/20 0441 03/06/20 0341  PROCALCITON  --  6.98  --  6.21 11.63  WBC 14.2* 9.9  --  10.4  --   LATICACIDVEN  --  2.0* 1.0  --   --     Microbiology Recent Results (from the past 240 hour(s))  Respiratory Panel by RT PCR (Flu A&B, Covid) - Nasopharyngeal Swab     Status: None   Collection Time: 03/03/20 12:50 PM   Specimen: Nasopharyngeal Swab  Result Value Ref Range Status   SARS Coronavirus 2 by RT PCR NEGATIVE NEGATIVE Final    Comment: (NOTE) SARS-CoV-2 target nucleic acids are NOT DETECTED. The SARS-CoV-2 RNA is generally detectable in upper respiratoy specimens during  the acute phase of infection. The lowest concentration of SARS-CoV-2 viral copies this assay can detect is 131 copies/mL. A negative result does not preclude SARS-Cov-2 infection and should not be used as the sole basis for treatment or other patient management decisions. A negative result may occur with  improper specimen collection/handling, submission of specimen other than nasopharyngeal swab, presence of viral mutation(s) within the areas targeted by this assay, and inadequate number of viral  copies (<131 copies/mL). A negative result must be combined with clinical observations, patient history, and epidemiological information. The expected result is Negative. Fact Sheet for Patients:  https://www.moore.com/https://www.fda.gov/media/142436/download Fact Sheet for Healthcare Providers:  https://www.young.biz/https://www.fda.gov/media/142435/download This test is not yet ap proved or cleared by the [place] FDA and  has been authorized for detection and/or diagnosis of SARS-CoV-2 by FDA under an Emergency Use Authorization (EUA). This EUA will remain  in effect (meaning this test can be used) for the duration of the COVID-19 declaration under Section 564(b)(1) of the Act, 21 U.S.C. section 360bbb-3(b)(1), unless the authorization is terminated or revoked sooner.    Influenza A by PCR NEGATIVE NEGATIVE Final   Influenza B by PCR NEGATIVE NEGATIVE Final    Comment: (NOTE) The Xpert Xpress SARS-CoV-2/FLU/RSV assay is intended as an aid in  the diagnosis of influenza from Nasopharyngeal swab specimens and  should not be used as a sole basis for treatment. Nasal washings and  aspirates are unacceptable for Xpert Xpress SARS-CoV-2/FLU/RSV  testing. Fact Sheet for Patients: https://www.moore.com/https://www.fda.gov/media/142436/download Fact Sheet for Healthcare Providers: https://www.young.biz/https://www.fda.gov/media/142435/download This test is not yet approved or cleared by the [place] FDA and  has been authorized for detection and/or diagnosis of SARS-CoV-2 by  FDA under an Emergency Use Authorization (EUA). This EUA will remain  in effect (meaning this test can be used) for the duration of the  Covid-19 declaration under Section 564(b)(1) of the Act, 21  U.S.C. section 360bbb-3(b)(1), unless the authorization is  terminated or revoked. Performed at [facility], [address] [place], [place] [address]   CULTURE, BLOOD (ROUTINE X 2) w Reflex to ID Panel     Status: Abnormal   Collection Time: 03/04/20  7:37 PM   Specimen: BLOOD   Result Value Ref Range Status   Specimen Description   Final    BLOOD BLOOD LEFT HAND Performed at [facility], [address] [place], [place] [address]    Special Requests   Final    BOTTLES DRAWN AEROBIC AND ANAEROBIC Blood Culture adequate volume Performed at [facility], [address] [place], [place] [address]    Culture  Setup Time   Final    GRAM POSITIVE COCCI IN BOTH AEROBIC AND ANAEROBIC BOTTLES CRITICAL RESULT CALLED TO, READ BACK BY AND VERIFIED WITH: SCOTT HALL AT 0612 03/05/20.PMF Performed at [facility], [address] [place], [place] [address]    Culture (A)  Final    STAPHYLOCOCCUS AUREUS SUSCEPTIBILITIES PERFORMED ON PREVIOUS CULTURE WITHIN THE LAST 5 DAYS. Performed at [facility], [address] [place], [place] [address]    Report Status 03/07/2020 FINAL  Final  CULTURE, BLOOD (ROUTINE X 2) w Reflex to ID Panel     Status: Abnormal   Collection Time: 03/04/20  7:41 PM   Specimen: BLOOD  Result Value Ref Range Status   Specimen Description   Final    BLOOD RIGHT ANTECUBITAL Performed at [facility], [address] [place], [place] [address]    Special  Requests   Final    BOTTLES DRAWN AEROBIC AND ANAEROBIC Blood Culture adequate volume Performed at [facility], [address] [place], [place] [address]    Culture  Setup Time   Final    GRAM POSITIVE COCCI IN BOTH AEROBIC AND ANAEROBIC BOTTLES CRITICAL RESULT CALLED TO, READ BACK BY AND VERIFIED WITH: [place] AT 0612 03/05/20.PMF Performed at [facility], [place] [address] [place], [place] [address]    Culture STAPHYLOCOCCUS AUREUS (A)  Final   Report Status 03/07/2020 FINAL  Final   Organism ID, Bacteria STAPHYLOCOCCUS AUREUS  Final      Susceptibility   Staphylococcus aureus - MIC*    CIPROFLOXACIN <=0.5 SENSITIVE Sensitive     ERYTHROMYCIN RESISTANT Resistant     GENTAMICIN <=0.5 SENSITIVE Sensitive     OXACILLIN 0.5  SENSITIVE Sensitive     TETRACYCLINE <=1 SENSITIVE Sensitive     VANCOMYCIN <=0.5 SENSITIVE Sensitive     TRIMETH/SULFA <=10 SENSITIVE Sensitive     CLINDAMYCIN RESISTANT Resistant     RIFAMPIN <=0.5 SENSITIVE Sensitive     Inducible Clindamycin POSITIVE Resistant     * STAPHYLOCOCCUS AUREUS  Blood Culture ID Panel (Reflexed)     Status: Abnormal   Collection Time: 03/04/20  7:41 PM  Result Value Ref Range Status   Enterococcus species NOT DETECTED NOT DETECTED Final   Listeria monocytogenes NOT DETECTED NOT DETECTED Final   Staphylococcus species DETECTED (A) NOT DETECTED Final    Comment: CRITICAL RESULT CALLED TO, READ BACK BY AND VERIFIED WITH: SCOTT HALL AT 0612 03/05/20.PMF    Staphylococcus aureus (BCID) DETECTED (A) NOT DETECTED Final    Comment: Methicillin (oxacillin) susceptible Staphylococcus aureus (MSSA). Preferred therapy is anti staphylococcal beta lactam antibiotic (Cefazolin or Nafcillin), unless clinically contraindicated. CRITICAL RESULT CALLED TO, READ BACK BY AND VERIFIED WITH: SCOTT HALL AT 0612 03/05/20.PMF    Methicillin resistance NOT DETECTED NOT DETECTED Final   Streptococcus species NOT DETECTED NOT DETECTED Final   Streptococcus agalactiae NOT DETECTED NOT DETECTED Final   Streptococcus pneumoniae NOT DETECTED NOT DETECTED Final   Streptococcus pyogenes NOT DETECTED NOT DETECTED Final   Acinetobacter baumannii NOT DETECTED NOT DETECTED Final   Enterobacteriaceae species NOT DETECTED NOT DETECTED Final   Enterobacter cloacae complex NOT DETECTED NOT DETECTED Final   Escherichia coli NOT DETECTED NOT DETECTED Final   Klebsiella oxytoca NOT DETECTED NOT DETECTED Final   Klebsiella pneumoniae NOT DETECTED NOT DETECTED Final   Proteus species NOT DETECTED NOT DETECTED Final   Serratia marcescens NOT DETECTED NOT DETECTED Final   Haemophilus influenzae NOT DETECTED NOT DETECTED Final   Neisseria meningitidis NOT DETECTED NOT DETECTED Final   Pseudomonas  aeruginosa NOT DETECTED NOT DETECTED Final   Candida albicans NOT DETECTED NOT DETECTED Final   Candida glabrata NOT DETECTED NOT DETECTED Final   Candida krusei NOT DETECTED NOT DETECTED Final   Candida parapsilosis NOT DETECTED NOT DETECTED Final   Candida tropicalis NOT DETECTED NOT DETECTED Final    Comment: Performed at [facility], [address] [place], [place] [address]  Culture, blood (Routine X 2) w Reflex to ID Panel     Status: None (Preliminary result)   Collection Time: 03/06/20 12:11 AM   Specimen: BLOOD  Result Value Ref Range Status   Specimen Description BLOOD BLOOD LEFT HAND  Final   Special Requests   Final    BOTTLES DRAWN AEROBIC AND ANAEROBIC Blood Culture adequate volume   Culture  Final    NO GROWTH 2 DAYS Performed at [facility], [address] [place], [place] [address]    Report Status PENDING  Incomplete  Culture, blood (Routine X 2) w Reflex to ID Panel     Status: None (Preliminary result)   Collection Time: 03/06/20  3:41 AM   Specimen: BLOOD  Result Value Ref Range Status   Specimen Description BLOOD RIGHT ANTECUBITAL  Final   Special Requests   Final    BOTTLES DRAWN AEROBIC AND ANAEROBIC Blood Culture adequate volume   Culture   Final    NO GROWTH 2 DAYS Performed at [facility], [address], [place], [place] [address]    Report Status PENDING  Incomplete    Procedures and diagnostic studies:  No results found.  Medications:   . amLODipine  10 mg Oral Daily  . aspirin EC  81 mg Oral Daily  . atorvastatin  40 mg Oral q1800  . Chlorhexidine Gluconate Cloth  6 each Topical Daily  . collagenase   Topical Daily  . enoxaparin (LOVENOX) injection  40 mg Subcutaneous Q24H  . insulin aspart  0-5 Units Subcutaneous QHS  . insulin aspart  0-9 Units Subcutaneous TID WC  . insulin aspart  10 Units Subcutaneous TID WC  . insulin aspart  7 Units Subcutaneous Once  . insulin glargine  25 Units Subcutaneous  Daily  . losartan  50 mg Oral Daily  . pantoprazole  40 mg Oral Daily   Continuous Infusions: . sodium chloride 250 mL (03/07/20 1524)  .  ceFAZolin (ANCEF) IV 2 g (03/08/20 0851)     LOS: 5 days   Spencer [name]  [facility]     03/08/2020, 11:43 AM

## 2020-03-08 NOTE — Evaluation (Signed)
Clinical/Bedside Swallow Evaluation Patient Details  Name: Spencer [name] MRN: [id number] Date of Birth: Jun 18, 1961  Today's Date: 03/08/2020 Time: SLP Start Time (ACUTE ONLY): 8416 SLP Stop Time (ACUTE ONLY): 1217 SLP Time Calculation (min) (ACUTE ONLY): 43 min  Past Medical History:  Past Medical History:  Diagnosis Date  . Brain aneurysm   . Broken bones    clavical, ankle, arms toes wriast   . Diabetes mellitus without complication ([place])   . Dysrhythmia   . GERD (gastroesophageal reflux disease)   . Hypertension   . Substance abuse [facility])    Past Surgical History:  Past Surgical History:  Procedure Laterality Date  . CORONARY/GRAFT ACUTE MI REVASCULARIZATION N/A 09/04/2019   Procedure: Coronary/Graft Acute MI Revascularization;  Surgeon: Yolonda Kida, MD;  Location: [place] CV LAB;  Service: Cardiovascular;  Laterality: N/A;  . ESOPHAGOGASTRODUODENOSCOPY (EGD) WITH PROPOFOL N/A 06/04/2019   Procedure: ESOPHAGOGASTRODUODENOSCOPY (EGD) WITH PROPOFOL;  Surgeon: Toledo, Benay Pike, MD;  Location: [facility] ENDOSCOPY;  Service: Gastroenterology;  Laterality: N/A;  . HERNIA REPAIR    . IMPLANTATION / PLACEMENT OF STRIP ELECTRODES VIA BURR HOLES SUBDURAL     anyrusum  . LEFT HEART CATH AND CORONARY ANGIOGRAPHY N/A 09/04/2019   Procedure: LEFT HEART CATH AND CORONARY ANGIOGRAPHY;  Surgeon: Yolonda Kida, MD;  Location: [place] CV LAB;  Service: Cardiovascular;  Laterality: N/A;  . NECK SURGERY    . TEE WITHOUT CARDIOVERSION N/A 06/03/2019   Procedure: TRANSESOPHAGEAL ECHOCARDIOGRAM (TEE);  Surgeon: Teodoro Spray, MD;  Location: [facility] ORS;  Service: Cardiovascular;  Laterality: N/A;   HPI:  Spencer [name] is an 59 y.o. male with medical history significant for type 2 diabetes mellitus, cocaine abuse, brain aneurysm, hypertension, GERD, recent discharge from the hospital on 02/17/2020 after hospitalization for DKA, AKI, cocaine intoxication, metabolic encephalopathy and  respiratory failure requiring intubation. He presented to the hospital after a fall at home. Per report, pt fell and was immoble at home x 3 days before friend called EMS. CT CSpine on 4/30 new minimally displaced fracture at the anterior margin of the C4 vertebral body, LEFT of midline. There is no fracture extension into the posterior vertebral body suggesting a stable fracture. Also revealed chronic expected osseous fusion at the C5 through C7 levels. He has been instructed to wear an ASPEN collar at all times however he has been declining it.    Assessment / Plan / Recommendation Clinical Impression  Pt presents with functional oropharyngeal abilities. He consumed items from regular lunch tray (meat, broccoli, salad) and thin liquids via cup and straw. Pt with a cough prior to consumption and he exhibited same cough x 1 during consumption. Pt states that this cough is his baseline "smoker's cough." At this time he appears at reduce risk of aspiration when following general aspiration precaution. Regular diet with thin liquids continue to be appropriate diet. Education provided to pt and his nurse. ST to sign off at this time. SLP Visit Diagnosis: Dysphagia, unspecified (R13.10)    Aspiration Risk  No limitations    Diet Recommendation   Age Appropriate Regular Textures, thin liquids  Medication Administration: Whole meds with liquid    Other  Recommendations Oral Care Recommendations: Oral care BID   Follow up Recommendations None      Frequency and Duration   N/A         Prognosis   N/A     Swallow Study   General Date of Onset: 03/07/20 HPI: Spencer Maroney  [name] is an 59 y.o. male with medical history significant for type 2 diabetes mellitus, cocaine abuse, brain aneurysm, hypertension, GERD, recent discharge from the hospital on 02/17/2020 after hospitalization for DKA, AKI, cocaine intoxication, metabolic encephalopathy and respiratory failure requiring intubation. He presented to the  hospital after a fall at home. Per report, pt fell and was immoble at home x 3 days before friend called EMS. CT CSpine on 4/30 new minimally displaced fracture at the anterior margin of the C4 vertebral body, LEFT of midline. There is no fracture extension into the posterior vertebral body suggesting a stable fracture. Also revealed chronic expected osseous fusion at the C5 through C7 levels. He has been instructed to wear an ASPEN collar at all times however he has been declining it.  Type of Study: Bedside Swallow Evaluation Previous Swallow Assessment: none present in chart Diet Prior to this Study: Regular;Thin liquids Temperature Spikes Noted: No Respiratory Status: Room air History of Recent Intubation: No Behavior/Cognition: Alert;Agitated Oral Cavity Assessment: Within Functional Limits Oral Care Completed by SLP: No Oral Cavity - Dentition: Adequate natural dentition Vision: Functional for self-feeding Self-Feeding Abilities: Able to feed self Patient Positioning: Upright in bed Baseline Vocal Quality: Normal Volitional Cough: Strong Volitional Swallow: Able to elicit    Oral/Motor/Sensory Function Overall Oral Motor/Sensory Function: Within functional limits   Ice Chips Ice chips: Not tested   Thin Liquid Thin Liquid: Within functional limits Presentation: Self Fed;Straw;Cup    Nectar Thick Nectar Thick Liquid: Not tested   Honey Thick Honey Thick Liquid: Not tested   Puree Puree: Within functional limits Presentation: Self Fed;Spoon   Solid    Marissa Lowrey B. Dreama Saa, M.S., CCC-SLP, [facility] Speech-Language Pathologist Rehabilitation Services Office [phone]  Solid: Within functional limits Presentation: Self Fed      Farzana Koci 03/08/2020,2:13 PM

## 2020-03-08 NOTE — Consult Note (Addendum)
WOC Nurse Consult Note: Reason for Consult: Consult requested for right elbow and sacrum wounds.  Pt was found down at home after several days and states wounds occurred at that time and began as blisters which have evolved upon assessment.  Wound type: Right elbow unstageable pressure injury; 2X1.8X.2cm, yellow and moist slough, mod amt yellow drainage, no odor or fluctuance. Sacrum/inner gluteal fold unstageable pressure injury; 2X2X.2cm, yellow and moist slough, mod amt yellow drainage, no odor or fluctuance Pressure Injury POA: Yes Dressing procedure/placement/frequency: Topical treatment orders provided for bedside nurses to perform daily to provide enzymatic debridement as follows: Apply Santyl ointment to right elbow and sacrum wounds Q day, then cover with moist 2X2 gauze and foam dressing.  (Change foam dressing Q 3 days or PRN soiling.) Please re-consult if further assistance is needed.  Thank-you,  Cammie Mcgee MSN, RN, CWOCN, [place], CNS [phone]

## 2020-03-08 NOTE — Progress Notes (Signed)
Orthopedic Tech Progress Note Patient Details:  Spencer [name] 1960/12/01 [id number] Called in STAT orders to [employer] for a ASPEN CERVICAL COLLAR and SOFT CERVICAL COLLAR. Patient ID: Spencer [name], male   DOB: 01/06/1961, 59 y.o.   MRN: [id number]   Donald Pore 03/08/2020, 12:56 PM

## 2020-03-08 NOTE — Progress Notes (Signed)
Inpatient Diabetes Program Recommendations  AACE/ADA: New Consensus Statement on Inpatient Glycemic Control   Target Ranges:  Prepandial:   less than 140 mg/dL      Peak postprandial:   less than 180 mg/dL (1-2 hours)      Critically ill patients:  140 - 180 mg/dL   Results for TALOR, CHEEMA (MRN [id number]) as of 03/08/2020 09:39  Ref. Range 03/07/2020 08:04 03/07/2020 11:38 03/07/2020 17:18 03/07/2020 20:31 03/08/2020 07:42  Glucose-Capillary Latest Ref Range: 70 - 99 mg/dL 003 (H) 704 (H) 888 (H) 144 (H) 235 (H)   Review of Glycemic Control  Current orders for Inpatient glycemic control: Lantus 25 units daily, Novolog 10 units TID with meals for meal coverage, Novolog 0-9 units TID with meals, Novolog 0-5 units QHS  Inpatient Diabetes Program Recommendations:   Insulin - Basal: Please consider increasing Lantus to 27 units daily.  Insulin - Meal Coverage: Please consider increasing meal coverage to Novolog 12 units TID with meals if patient eats at least 50% of meals.  Thanks, Orlando Penner, RN, MSN, CDE Diabetes Coordinator Inpatient Diabetes Program [phone] (Team Pager from 8am to 5pm)

## 2020-03-08 NOTE — Progress Notes (Signed)
ID  MSSA bacteremia H/o cocaine use DKA Fracture c4 Cleared by neurosurgery to undergo TEE with ASPEN collar Repeat BC from 03/06/20 neg AKI resolved Continue cefazolin Will decide on duration after TEE Pt cannot be sent home with PICC-

## 2020-03-08 NOTE — Consult Note (Addendum)
Referring Physician:  MD Mal Misty  Primary Physician:  Patient, No Pcp Per  Chief Complaint:  C4 cervical fracture  History of Present Illness: Spencer [name] is a 59 y.o. male with PMH of DM2, cocaine abuse, HTN, who presents with the chief complaint of a cervical fracture. From MD Ayiku's note from 03/08/2020, and confirmed with the patient, "Hepresented to the hospital after a fall at home. He said he fell 2 days ago when he was trying to place a new showerhead in his bathroom. He stood on the edge of the bathtub but unfortunately he slipped and fell while attempting to place a new showerhead". A head CT was completed which showed no acute intracranial process. A CT C spine was completed which showed a new, minimally displaced fracture at the anterior margin of the C4 vertebral body, left of midline. He has been instructed to wear an ASPEN collar at all times however he has been declining it. Neurosurgery has been consulted to provide recommendation re cervical spine fracture as he needs to undergo a TEE with cardiology.   Spencer [name] has no symptoms of cervical myelopathy. He does endorse chronic numbness, non dermatomal, to his arms and legs bilaterally but unable to describe exactly where the numbness is, or quantify the extent of numbness. He said this has been present for a long time, prior to his fall. His strength to both bilateral UE and LE is intact and he denies any saddle anesthesia, bowel or bladder incontinence.    Review of Systems:  A 10 point review of systems is negative, except for the pertinent positives and negatives detailed in the HPI.  Past Medical History: Past Medical History:  Diagnosis Date  . Brain aneurysm   . Broken bones    clavical, ankle, arms toes wriast   . Diabetes mellitus without complication ([place])   . Dysrhythmia   . GERD (gastroesophageal reflux disease)   . Hypertension   . Substance abuse [facility])     Past Surgical History: Past Surgical History:   Procedure Laterality Date  . CORONARY/GRAFT ACUTE MI REVASCULARIZATION N/A 09/04/2019   Procedure: Coronary/Graft Acute MI Revascularization;  Surgeon: Yolonda Kida, MD;  Location: [place] CV LAB;  Service: Cardiovascular;  Laterality: N/A;  . ESOPHAGOGASTRODUODENOSCOPY (EGD) WITH PROPOFOL N/A 06/04/2019   Procedure: ESOPHAGOGASTRODUODENOSCOPY (EGD) WITH PROPOFOL;  Surgeon: Toledo, Benay Pike, MD;  Location: [facility] ENDOSCOPY;  Service: Gastroenterology;  Laterality: N/A;  . HERNIA REPAIR    . IMPLANTATION / PLACEMENT OF STRIP ELECTRODES VIA BURR HOLES SUBDURAL     anyrusum  . LEFT HEART CATH AND CORONARY ANGIOGRAPHY N/A 09/04/2019   Procedure: LEFT HEART CATH AND CORONARY ANGIOGRAPHY;  Surgeon: Yolonda Kida, MD;  Location: [place] CV LAB;  Service: Cardiovascular;  Laterality: N/A;  . NECK SURGERY    . TEE WITHOUT CARDIOVERSION N/A 06/03/2019   Procedure: TRANSESOPHAGEAL ECHOCARDIOGRAM (TEE);  Surgeon: Teodoro Spray, MD;  Location: [facility] ORS;  Service: Cardiovascular;  Laterality: N/A;    Allergies: Allergies as of 03/03/2020  . (No Known Allergies)    Medications:  Current Facility-Administered Medications:  .  0.9 %  sodium chloride infusion, , Intravenous, PRN, Jennye Boroughs, MD, Last Rate: 10 mL/hr at 03/07/20 1524, 250 mL at 03/07/20 1524 .  acetaminophen (TYLENOL) tablet 650 mg, 650 mg, Oral, Q6H PRN, 650 mg at 03/04/20 2351 **OR** acetaminophen (TYLENOL) suppository 650 mg, 650 mg, Rectal, Q6H PRN, Jennye Boroughs, MD, 650 mg at 03/07/20 0005 .  amLODipine (  NORVASC) tablet 10 mg, 10 mg, Oral, Daily, Lurene Shadow, MD, 10 mg at 03/08/20 1037 .  aspirin EC tablet 81 mg, 81 mg, Oral, Daily, Lurene Shadow, MD, 81 mg at 03/08/20 1037 .  atorvastatin (LIPITOR) tablet 40 mg, 40 mg, Oral, q1800, Lurene Shadow, MD, 40 mg at 03/07/20 1738 .  ceFAZolin (ANCEF) IVPB 2g/100 mL premix, 2 g, Intravenous, Q8H, Lurene Shadow, MD, Last Rate: 200 mL/hr at 03/08/20 0851, 2 g  at 03/08/20 0851 .  Chlorhexidine Gluconate Cloth 2 % PADS 6 each, 6 each, Topical, Daily, Lurene Shadow, MD, 6 each at 03/07/20 1200 .  collagenase (SANTYL) ointment, , Topical, Daily, Lurene Shadow, MD, Given at 03/08/20 1042 .  dextrose 50 % solution 0-50 mL, 0-50 mL, Intravenous, PRN, Lurene Shadow, MD .  enoxaparin (LOVENOX) injection 40 mg, 40 mg, Subcutaneous, Q24H, Lurene Shadow, MD, 40 mg at 03/07/20 2118 .  HYDROmorphone (DILAUDID) injection 1 mg, 1 mg, Intravenous, Q4H PRN, Lurene Shadow, MD, 1 mg at 03/06/20 1737 .  insulin aspart (novoLOG) injection 0-5 Units, 0-5 Units, Subcutaneous, QHS, Lurene Shadow, MD, 2 Units at 03/07/20 0134 .  insulin aspart (novoLOG) injection 0-9 Units, 0-9 Units, Subcutaneous, TID WC, Lurene Shadow, MD, 3 Units at 03/08/20 0848 .  insulin aspart (novoLOG) injection 10 Units, 10 Units, Subcutaneous, TID WC, Lurene Shadow, MD, 10 Units at 03/08/20 0848 .  insulin aspart (novoLOG) injection 7 Units, 7 Units, Subcutaneous, Once, Lurene Shadow, MD .  insulin glargine (LANTUS) injection 25 Units, 25 Units, Subcutaneous, Daily, Lurene Shadow, MD, 25 Units at 03/08/20 1038 .  ipratropium-albuterol (DUONEB) 0.5-2.5 (3) MG/3ML nebulizer solution 3 mL, 3 mL, Nebulization, Q6H PRN, Lurene Shadow, MD, 3 mL at 03/04/20 2236 .  losartan (COZAAR) tablet 50 mg, 50 mg, Oral, Daily, Lurene Shadow, MD, 50 mg at 03/08/20 1037 .  ondansetron (ZOFRAN) tablet 4 mg, 4 mg, Oral, Q6H PRN **OR** ondansetron (ZOFRAN) injection 4 mg, 4 mg, Intravenous, Q6H PRN, Lurene Shadow, MD .  oxyCODONE (Oxy IR/ROXICODONE) immediate release tablet 5 mg, 5 mg, Oral, Q6H PRN, Lurene Shadow, MD, 5 mg at 03/08/20 1037 .  pantoprazole (PROTONIX) EC tablet 40 mg, 40 mg, Oral, Daily, Lurene Shadow, MD, 40 mg at 03/08/20 1037 .  polyethylene glycol (MIRALAX / GLYCOLAX) packet 17 g, 17 g, Oral, Daily, Lurene Shadow, MD .  senna-docusate (Senokot-S) tablet 1 tablet, 1 tablet, Oral, QHS, Lurene Shadow, MD .  traMADol Janean Sark) tablet 50 mg, 50 mg, Oral, Q6H PRN, Lurene Shadow, MD, 50 mg at 03/05/20 1451   Social History: Social History   Tobacco Use  . Smoking status: Current Some Day Smoker  . Smokeless tobacco: Never Used  Substance Use Topics  . Alcohol use: Yes    Alcohol/week: 1.0 standard drinks    Types: 1 Cans of beer per week  . Drug use: Not Currently    Types: Cocaine    Family Medical History: Family History  Problem Relation Age of Onset  . CAD Sister   . Hypertension Sister   . Healthy Mother   . Healthy Father     Physical Examination: Vitals:   03/07/20 2314 03/08/20 0839  BP: 123/78 (!) 144/90  Pulse: 92 94  Resp: 16 18  Temp: 98.7 F (37.1 C)   SpO2: 94% 98%     General: Patient is well developed, well nourished, calm, collected, and in no apparent distress.  Psychiatric: Patient is anxious.  Head:  Pupils equal, round, and reactive to light.  ENT:  Oral mucosa appears well hydrated.  Neck:   Mild TTP over cervical spine. ROM deferred due to fracture. Of note he is NOT wearing his C   collar at the time of examination and refused to have it placed on.   Respiratory: Patient is breathing without any difficulty.  Extremities: No edema.  Vascular: Palpable pulses in dorsal pedal vessels.  Skin:   On exposed skin, there are no abnormal skin lesions.  NEUROLOGICAL:  General: In no acute distress.   Awake, alert, oriented to person, place, and time.  Pupils equal round and reactive to light.  Facial tone is symmetric.  Tongue protrusion is midline.   ROM of spine: Deferred due to fracture.  Palpation of spine: Mild TTP over cervical spine.   Strength: Side Biceps Triceps Deltoid Interossei Grip Wrist Ext. Wrist Flex.  R 5 5 5 5 5 5 5   L 5 5 5 5 5 5 5    Side Iliopsoas Quads Hamstring PF DF EHL  R 5 5 5 5 5 5   L 5 5 5 5 5 5    Bilateral upper and lower extremity sensation is intact to light touch on exam. No evidence of  numbness at this time. Hoffman's is absent.  Imaging: CT Cspine 03/05/2020: 1. New minimally displaced fracture at the anterior margin of the C4 vertebral body, LEFT of midline. There is no fracture extension into the posterior vertebral body suggesting a stable fracture. 2. Prevertebral fluid which is new since previous CT of 08/19/2019, almost certainly related to the new fracture at the anterior margin of the C4 vertebral body, and suspicious for associated injury/tear of the anterior longitudinal ligament. 3. Expected osseous fusion at the C5 through C7 levels. 4. Degenerative changes as detailed above. There may be associated nerve root impingement at multiple levels. 5. Bilateral carotid atherosclerosis.  Assessment and Plan: Mr. Sedor is a pleasant 59 y.o. male with C4 cervical fracture.  From a neurosurgical standpoint, the patient is cleared to undergo a TEE as long as the ASPEN collar remains in place during the entirety of the procedure.   Please ensure that he wears the Aspen collar at all times.  I have reiterated this to the patient and reviewed the risks of not wearing it, however he reports it does not fit well and is uncomfortable, therefore, I have contacted our orthotics supplier to ensure that he has a well fitting Aspen collar.   We will arrange for follow-up in clinic.  03/07/2020, NP  Dept. of Neurosurgery [phone]

## 2020-03-09 LAB — URINE DRUG SCREEN, QUALITATIVE (ARMC ONLY)
Amphetamines, Ur Screen: NOT DETECTED
Barbiturates, Ur Screen: NOT DETECTED
Benzodiazepine, Ur Scrn: NOT DETECTED
Cannabinoid 50 Ng, Ur ~~LOC~~: NOT DETECTED
Cocaine Metabolite,Ur ~~LOC~~: NOT DETECTED
MDMA (Ecstasy)Ur Screen: NOT DETECTED
Methadone Scn, Ur: NOT DETECTED
Opiate, Ur Screen: NOT DETECTED
Phencyclidine (PCP) Ur S: NOT DETECTED
Tricyclic, Ur Screen: NOT DETECTED

## 2020-03-09 LAB — BASIC METABOLIC PANEL
Anion gap: 8 (ref 5–15)
BUN: 15 mg/dL (ref 8–23)
CO2: 25 mmol/L (ref 22–32)
Calcium: 8.4 mg/dL — ABNORMAL LOW (ref 8.9–10.3)
Chloride: 98 mmol/L (ref 98–111)
Creatinine, Ser: 0.84 mg/dL (ref 0.61–1.24)
GFR calc Af Amer: >60 mL/min (ref >60)
GFR calc non Af Amer: >60 mL/min (ref >60)
Glucose, Bld: 311 mg/dL — ABNORMAL HIGH (ref 70–99)
Potassium: 3.6 mmol/L (ref 3.5–5.1)
Sodium: 131 mmol/L — ABNORMAL LOW (ref 135–145)

## 2020-03-09 LAB — GLUCOSE, CAPILLARY
Glucose-Capillary: 265 mg/dL — ABNORMAL HIGH (ref 70–99)
Glucose-Capillary: 209 mg/dL — ABNORMAL HIGH (ref 70–99)
Glucose-Capillary: 174 mg/dL — ABNORMAL HIGH (ref 70–99)
Glucose-Capillary: 225 mg/dL — ABNORMAL HIGH (ref 70–99)

## 2020-03-09 MED ORDER — ZIPRASIDONE MESYLATE 20 MG IM SOLR
10.0000 mg | Freq: Once | INTRAMUSCULAR | Status: DC
  Filled 2020-03-09: qty 20

## 2020-03-09 NOTE — Progress Notes (Signed)
Patients behavior has been escalating since the beginning of the shift.  Patient has been found wandering the hallways multiple times.  Patient refusing all treatment and has notified his friend to come to the hospital and pick him up.  Patient signed out AMA even after speaking with two different physicians.  Patient left with his clothes on, no shoes, and his wallet in a patient belongings bag.

## 2020-03-09 NOTE — Progress Notes (Addendum)
     59 y.o. male with medical history significant for type 2 diabetes mellitus, cocaine abuse,brain aneurysm,hypertension, GERD,recently discharged from the hospital on 02/17/2020 after hospitalization for DKA, AKI, cocaine intoxication, metabolic encephalopathy and respiratory failure requiring intubation. He presented back on 03/03/2020 s/p fall and was noted to have  MSSA bacteremia, C4 fx and right leg/hip pain. He was cleared by neurosurgery to undergo TEE with ASPEN collar BUT patient at this time expresses desire to leave the hospital AMA, patient has been warned that this is not Medically advisable at this time, and can result in Medical complications like Death and Disability, patient understands and accepts the risks involved and assumes full responsibilty of this decision. Given his complex medical conditions, I discussed this case with Dr. Lindajo Royal who also evaluated patient at the bedside and discussed risk of leaving AMA. However, despite medical advise he has decided to leave and signed the appropriate paper work.   Webb Silversmith, DNP, CCRN, APRN, FNP-BC [facility] Nurse Practitioner

## 2020-03-09 NOTE — Progress Notes (Signed)
Pharmacy Antibiotic Note  Spencer [name] is a 59 y.o. male admitted on 03/03/2020 with sepsis and bacteremia.  Pharmacy has been consulted for Ancef dosing.    Plan: Continue Ancef 2gm IV q8hrs  Height: 5\' 10"  (177.8 cm) Weight: 61.8 kg (136 lb 3.9 oz) IBW/kg (Calculated) : 73  Temp (24hrs), Avg:98.3 F (36.8 C), Min:97.8 F (36.6 C), Max:98.8 F (37.1 C)  Recent Labs  Lab 03/03/20 1250 03/03/20 2144 03/04/20 1656 03/04/20 1938 03/04/20 2155 03/05/20 0441 03/06/20 0341 03/07/20 0428 03/08/20 0437 03/09/20 0854  WBC 14.2*  --   --  9.9  --  10.4  --   --   --   --   CREATININE 1.42*   < >   < >  --   --  0.89 0.85 0.72 0.61 0.84  LATICACIDVEN  --   --   --  2.0* 1.0  --   --   --   --   --    < > = values in this interval not displayed.    Estimated Creatinine Clearance: 80.7 mL/min (by C-G formula based on SCr of 0.84 mg/dL).    No Known Allergies  Antimicrobials this admission:  4/29 Zosyn  >> 4/30 4/30 Cefazolin >>   Microbiology results: 4/29 BCx: MSSA 5/1 Bcx: NG TD   Thank you for allowing pharmacy to be a part of this patient's care.  5/29, PharmD, BCPS Clinical Pharmacist 03/09/2020 10:30 AM

## 2020-03-09 NOTE — Progress Notes (Addendum)
Progress Note    Spencer [name]  XLK:[id number] DOB: 12-02-1960  DOA: 03/03/2020 PCP: Patient, No Pcp Per      Brief Narrative:    Medical records reviewed and are as summarized below:  Spencer [name] is an 59 y.o. male with medical history significant for type 2 diabetes mellitus, cocaine abuse, brain aneurysm, hypertension, right elbow and sacral decubitus ulcers, GERD, recent discharge from the hospital on 02/17/2020 after hospitalization for DKA, AKI, cocaine intoxication, metabolic encephalopathy and respiratory failure requiring intubation.  He presented to the hospital after a fall at home. He said he fell 2 days ago when he was trying to place a new showerhead in his bathroom. He stood on the edge of the bathtub but unfortunately he slipped and fell while attempting to place a new showerhead. He was unable to get up after he fell. He laid on the floor for 2 days. He said he could not eat nor drink and he could not take any of his medicines including insulin. He said he has a roommate but the roommate could not help him. He said his roommate cannot even help himself. He complained of pain in his right hip that developed after the fall. He also complained of thirst, hunger, dry throat and generalized weakness  He was admitted to the hospital for DKA and AKI.  He was treated with IV fluids and insulin infusion per DKA protocol.  DKA and AKI have resolved.  He complained of pain in the right hip but x-ray and CT of the right hip did not show any evidence of fracture.  He also complained of neck pain and CT cervical spine showed mild and stable C4 fracture.  Neurosurgeon, Dr. Myer Haff was consulted for this and he recommended conservative management with analgesics and a cervical collar.  During this hospitalization, patient developed a fever and became septic.  Further work-up revealed MSSA bacteremia.  He was treated with empiric IV antibiotics which was narrowed down to IV Ancef.  ID was  consulted to assist with management.  2D echo did not show any evidence of vegetation.  Cardiologist has been consulted for TEE.      Assessment/Plan:   Principal Problem:   DKA (diabetic ketoacidoses) (HCC) Active Problems:   Hypertension   AKI (acute kidney injury) (HCC)   Fall at home   Fever   Staphylococcus aureus bacteremia with sepsis (HCC)   C4 cervical fracture (HCC)   Pressure injury of skin   DKA in a patient with type II DM: off of IV insulin infusion since 03/04/2020.  Continue Lantus and NovoLog.  Hemoglobin A1c was 12.6 on 02/15/2020.  MSSA sepsis and bacteremia: Continue IV Ancef.  No vegetations on 2D echo.  Consulted Dr. Lady Gary, cardiologist, for TEE.  I informed him that patient has been cleared by neurosurgeon for TEE.  Plan for TEE tomorrow.   Follow-up with ID and cardiologist.  Grade 1 diastolic dysfunction and EF of 60 to 65% on 2D echo.  Asymptomatic.  Mild and stable but displaced fracture of C4 vertebral body: Conservative management with Aspen collar and pain control was recommended by neurosurgeon..  The importance of wearing the cervical collar was reiterated.  He said "I don't have to agree to wear it".   Dysphagia/coughing spells while eating: Speech therapist has been consulted for swallow evaluation.  Regular diet recommended.  Hyponatremia: Asymptomatic.  Stable.  Continue to monitor.  Right hip pain, s/p fall at home: No acute  findings on x-ray and CT right hip. CK level was normal.  PT and OT recommend home health therapy.  However his sister, [name], wants him to be discharged to SNF because of poor living conditions.  Follow-up with social Development worker, community to assist with disposition.  Hypertension: Continue amlodipine and losartan  AKI and leukocytosis: Resolved  Cocaine abuse: Counseled to quit.  Decubitus ulcers- unstageable decubitus ulcer on right elbow. unstageable midline sacral decubitus ulcer, unstageable right buttock  decubitus ulcer (all present on admission).  Patient has been evaluated by wound care nurse.  Continue local wound care.  He has been encouraged to turn from side-to-side.   Pressure Injury 03/06/20 Sacrum Medial Unstageable - Full thickness tissue loss in which the base of the injury is covered by slough (yellow, tan, gray, green or brown) and/or eschar (tan, brown or black) in the wound bed. pt states this occurred when he (Active)  03/06/20 1147  Location: Sacrum (Lateral to coccyx)  Location Orientation: Medial  Staging: Unstageable - Full thickness tissue loss in which the base of the injury is covered by slough (yellow, tan, gray, green or brown) and/or eschar (tan, brown or black) in the wound bed.  Wound Description (Comments): pt states this occurred when he fell at home  Present on Admission: Yes (Present on transfer from ICU)     Pressure Injury 03/06/20 Elbow Right Unstageable - Full thickness tissue loss in which the base of the injury is covered by slough (yellow, tan, gray, green or brown) and/or eschar (tan, brown or black) in the wound bed. pt states this occurred when he f (Active)  03/06/20 1147  Location: Elbow  Location Orientation: Right  Staging: Unstageable - Full thickness tissue loss in which the base of the injury is covered by slough (yellow, tan, gray, green or brown) and/or eschar (tan, brown or black) in the wound bed.  Wound Description (Comments): pt states this occurred when he fell at home  Present on Admission: Yes (Present on transfer from ICU)             Body mass index is 19.55 kg/m.   Family Communication/Anticipated D/C date and plan/Code Status   DVT prophylaxis: Lovenox Code Status: Full code Family Communication: Plan discussed with patient's sister [name]. Disposition Plan:    Status is: Inpatient  Remains inpatient appropriate because:Inpatient level of care appropriate due to severity of illness   Dispo: The patient is  from: Home              Anticipated d/c is to: Home              Anticipated d/c date is: 2 days              Patient currently is not medically stable to d/c.            Subjective:   No new complaints.  He still has pain in the right hip and neck.  He's still reluctant to wear the cervical collar but reiterating the risks and benefits of having the cervical collar.  Objective:    Vitals:   03/08/20 0839 03/08/20 1657 03/08/20 2339 03/09/20 0733  BP: (!) 144/90 123/78 131/84 131/82  Pulse: 94 80 87 93  Resp: 18 18 16 20   Temp:  98.8 F (37.1 C) 97.8 F (36.6 C) 98.3 F (36.8 C)  TempSrc:   Oral Oral  SpO2: 98% 97% 96% 95%  Weight:      Height:  No data found.   Intake/Output Summary (Last 24 hours) at 03/09/2020 1225 Last data filed at 03/09/2020 0833 Gross per 24 hour  Intake 480 ml  Output 1700 ml  Net -1220 ml   Filed Weights   03/03/20 1240  Weight: 61.8 kg    Exam:  GEN: NAD SKIN: No rash. unstageable decubitus ulcer on right elbow. unstageable midline sacral decubitus ulcer, unstageable right buttock decubitus ulcer (all present on admission) EYES: No pallor or icterus  ENT: MMM, cervical spine tenderness CV: RRR PULM: No wheezing or rales heard ABD: soft, ND, NT, +BS CNS: AAO x 3, non focal EXT: Right hip tenderness and swelling (slowly improving).  No leg edema or tenderness.   Data Reviewed:   I have personally reviewed following labs and imaging studies:  Labs: Labs show the following:   Basic Metabolic Panel: Recent Labs  Lab 03/03/20 2144 03/04/20 0411 03/05/20 0441 03/05/20 0441 03/06/20 0341 03/06/20 0341 03/07/20 0428 03/07/20 0428 03/08/20 0437 03/09/20 0854  NA 132*   < > 130*  --  132*  --  130*  --  132* 131*  K 4.9   < > 3.9   < > 3.9   < > 3.4*   < > 3.7 3.6  CL 103   < > 100  --  97*  --  97*  --  100 98  CO2 13*   < > 24  --  25  --  26  --  26 25  GLUCOSE 212*   < > 229*  --  165*  --  208*  --  195*  311*  BUN 22   < > 13  --  16  --  13  --  15 15  CREATININE 1.11   < > 0.89  --  0.85  --  0.72  --  0.61 0.84  CALCIUM 8.8*   < > 8.4*  --  8.7*  --  8.2*  --  8.4* 8.4*  MG 2.1  --   --   --   --   --  2.0  --   --   --    < > = values in this interval not displayed.   GFR Estimated Creatinine Clearance: 80.7 mL/min (by C-G formula based on SCr of 0.84 mg/dL). Liver Function Tests: Recent Labs  Lab 03/03/20 1250  AST 15  ALT 20  ALKPHOS 86  BILITOT 2.3*  PROT 7.6  ALBUMIN 3.7   No results for input(s): LIPASE, AMYLASE in the last 168 hours. No results for input(s): AMMONIA in the last 168 hours. Coagulation profile No results for input(s): INR, PROTIME in the last 168 hours.  CBC: Recent Labs  Lab 03/03/20 1250 03/04/20 1938 03/05/20 0441  WBC 14.2* 9.9 10.4  NEUTROABS  --  8.6* 9.1*  HGB 14.5 11.8* 12.6*  HCT 44.8 35.0* 36.5*  MCV 94.9 92.8 90.1  PLT 278 202 175   Cardiac Enzymes: Recent Labs  Lab 03/03/20 1249  CKTOTAL 77   BNP (last 3 results) No results for input(s): PROBNP in the last 8760 hours. CBG: Recent Labs  Lab 03/08/20 1136 03/08/20 1655 03/08/20 2119 03/09/20 0734 03/09/20 1209  GLUCAP 196* 81 166* 265* 209*   D-Dimer: No results for input(s): DDIMER in the last 72 hours. Hgb A1c: No results for input(s): HGBA1C in the last 72 hours. Lipid Profile: No results for input(s): CHOL, HDL, LDLCALC, TRIG, CHOLHDL, LDLDIRECT in the last 72 hours. Thyroid  function studies: No results for input(s): TSH, T4TOTAL, T3FREE, THYROIDAB in the last 72 hours.  Invalid input(s): FREET3 Anemia work up: No results for input(s): VITAMINB12, FOLATE, FERRITIN, TIBC, IRON, RETICCTPCT in the last 72 hours. Sepsis Labs: Recent Labs  Lab 03/03/20 1250 03/04/20 1938 03/04/20 2155 03/05/20 0441 03/06/20 0341  PROCALCITON  --  6.98  --  6.21 11.63  WBC 14.2* 9.9  --  10.4  --   LATICACIDVEN  --  2.0* 1.0  --   --     Microbiology Recent Results  (from the past 240 hour(s))  Respiratory Panel by RT PCR (Flu A&B, Covid) - Nasopharyngeal Swab     Status: None   Collection Time: 03/03/20 12:50 PM   Specimen: Nasopharyngeal Swab  Result Value Ref Range Status   SARS Coronavirus 2 by RT PCR NEGATIVE NEGATIVE Final    Comment: (NOTE) SARS-CoV-2 target nucleic acids are NOT DETECTED. The SARS-CoV-2 RNA is generally detectable in upper respiratoy specimens during the acute phase of infection. The lowest concentration of SARS-CoV-2 viral copies this assay can detect is 131 copies/mL. A negative result does not preclude SARS-Cov-2 infection and should not be used as the sole basis for treatment or other patient management decisions. A negative result may occur with  improper specimen collection/handling, submission of specimen other than nasopharyngeal swab, presence of viral mutation(s) within the areas targeted by this assay, and inadequate number of viral copies (<131 copies/mL). A negative result must be combined with clinical observations, patient history, and epidemiological information. The expected result is Negative. Fact Sheet for Patients:  https://www.moore.com/ Fact Sheet for Healthcare Providers:  https://www.young.biz/ This test is not yet ap proved or cleared by the [place] FDA and  has been authorized for detection and/or diagnosis of SARS-CoV-2 by FDA under an Emergency Use Authorization (EUA). This EUA will remain  in effect (meaning this test can be used) for the duration of the COVID-19 declaration under Section 564(b)(1) of the Act, 21 U.S.C. section 360bbb-3(b)(1), unless the authorization is terminated or revoked sooner.    Influenza A by PCR NEGATIVE NEGATIVE Final   Influenza B by PCR NEGATIVE NEGATIVE Final    Comment: (NOTE) The Xpert Xpress SARS-CoV-2/FLU/RSV assay is intended as an aid in  the diagnosis of influenza from Nasopharyngeal swab specimens and    should not be used as a sole basis for treatment. Nasal washings and  aspirates are unacceptable for Xpert Xpress SARS-CoV-2/FLU/RSV  testing. Fact Sheet for Patients: https://www.moore.com/ Fact Sheet for Healthcare Providers: https://www.young.biz/ This test is not yet approved or cleared by the [place] FDA and  has been authorized for detection and/or diagnosis of SARS-CoV-2 by  FDA under an Emergency Use Authorization (EUA). This EUA will remain  in effect (meaning this test can be used) for the duration of the  Covid-19 declaration under Section 564(b)(1) of the Act, 21  U.S.C. section 360bbb-3(b)(1), unless the authorization is  terminated or revoked. Performed at [facility], [address] [place], [place] [address]   CULTURE, BLOOD (ROUTINE X 2) w Reflex to ID Panel     Status: Abnormal   Collection Time: 03/04/20  7:37 PM   Specimen: BLOOD  Result Value Ref Range Status   Specimen Description   Final    BLOOD BLOOD LEFT HAND Performed at [facility], [address] [place], [place] [address]    Special Requests   Final    BOTTLES DRAWN AEROBIC AND ANAEROBIC Blood Culture adequate  volume Performed at [facility], [address] [place], [place] [address]    Culture  Setup Time   Final    GRAM POSITIVE COCCI IN BOTH AEROBIC AND ANAEROBIC BOTTLES CRITICAL RESULT CALLED TO, READ BACK BY AND VERIFIED WITH: SCOTT HALL AT 6578 03/05/20.PMF Performed at [facility], [address] [place], [place] [address]    Culture (A)  Final    STAPHYLOCOCCUS AUREUS SUSCEPTIBILITIES PERFORMED ON PREVIOUS CULTURE WITHIN THE LAST 5 DAYS. Performed at [facility], [address] [place], [place] [address]    Report Status 03/07/2020 FINAL  Final  CULTURE, BLOOD (ROUTINE X 2) w Reflex to ID Panel     Status: Abnormal   Collection Time: 03/04/20  7:41 PM   Specimen: BLOOD  Result Value Ref  Range Status   Specimen Description   Final    BLOOD RIGHT ANTECUBITAL Performed at [facility], [address], [place], [place] [address]    Special Requests   Final    BOTTLES DRAWN AEROBIC AND ANAEROBIC Blood Culture adequate volume Performed at [facility], [address] [place], [place] [address]    Culture  Setup Time   Final    GRAM POSITIVE COCCI IN BOTH AEROBIC AND ANAEROBIC BOTTLES CRITICAL RESULT CALLED TO, READ BACK BY AND VERIFIED WITH: SCOTT HALL AT 0612 03/05/20.PMF Performed at [facility], [address], [place], [place] [address]    Culture STAPHYLOCOCCUS AUREUS (A)  Final   Report Status 03/07/2020 FINAL  Final   Organism ID, Bacteria STAPHYLOCOCCUS AUREUS  Final      Susceptibility   Staphylococcus aureus - MIC*    CIPROFLOXACIN <=0.5 SENSITIVE Sensitive     ERYTHROMYCIN RESISTANT Resistant     GENTAMICIN <=0.5 SENSITIVE Sensitive     OXACILLIN 0.5 SENSITIVE Sensitive     TETRACYCLINE <=1 SENSITIVE Sensitive     VANCOMYCIN <=0.5 SENSITIVE Sensitive     TRIMETH/SULFA <=10 SENSITIVE Sensitive     CLINDAMYCIN RESISTANT Resistant     RIFAMPIN <=0.5 SENSITIVE Sensitive     Inducible Clindamycin POSITIVE Resistant     * STAPHYLOCOCCUS AUREUS  Blood Culture ID Panel (Reflexed)     Status: Abnormal   Collection Time: 03/04/20  7:41 PM  Result Value Ref Range Status   Enterococcus species NOT DETECTED NOT DETECTED Final   Listeria monocytogenes NOT DETECTED NOT DETECTED Final   Staphylococcus species DETECTED (A) NOT DETECTED Final    Comment: CRITICAL RESULT CALLED TO, READ BACK BY AND VERIFIED WITH: SCOTT HALL AT 0612 03/05/20.PMF    Staphylococcus aureus (BCID) DETECTED (A) NOT DETECTED Final    Comment: Methicillin (oxacillin) susceptible Staphylococcus aureus (MSSA). Preferred therapy is anti staphylococcal beta lactam antibiotic (Cefazolin or Nafcillin), unless clinically contraindicated. CRITICAL RESULT CALLED TO, READ BACK BY  AND VERIFIED WITH: SCOTT HALL AT 0612 03/05/20.PMF    Methicillin resistance NOT DETECTED NOT DETECTED Final   Streptococcus species NOT DETECTED NOT DETECTED Final   Streptococcus agalactiae NOT DETECTED NOT DETECTED Final   Streptococcus pneumoniae NOT DETECTED NOT DETECTED Final   Streptococcus pyogenes NOT DETECTED NOT DETECTED Final   Acinetobacter baumannii NOT DETECTED NOT DETECTED Final   Enterobacteriaceae species NOT DETECTED NOT DETECTED Final   Enterobacter cloacae complex NOT DETECTED NOT DETECTED Final   Escherichia coli NOT DETECTED NOT DETECTED Final   Klebsiella oxytoca NOT DETECTED NOT DETECTED Final   Klebsiella pneumoniae NOT DETECTED NOT DETECTED Final   Proteus species NOT  DETECTED NOT DETECTED Final   Serratia marcescens NOT DETECTED NOT DETECTED Final   Haemophilus influenzae NOT DETECTED NOT DETECTED Final   Neisseria meningitidis NOT DETECTED NOT DETECTED Final   Pseudomonas aeruginosa NOT DETECTED NOT DETECTED Final   Candida albicans NOT DETECTED NOT DETECTED Final   Candida glabrata NOT DETECTED NOT DETECTED Final   Candida krusei NOT DETECTED NOT DETECTED Final   Candida parapsilosis NOT DETECTED NOT DETECTED Final   Candida tropicalis NOT DETECTED NOT DETECTED Final    Comment: Performed at [facility], [address] [place], [place] [address]  Culture, blood (Routine X 2) w Reflex to ID Panel     Status: None (Preliminary result)   Collection Time: 03/06/20 12:11 AM   Specimen: BLOOD  Result Value Ref Range Status   Specimen Description BLOOD BLOOD LEFT HAND  Final   Special Requests   Final    BOTTLES DRAWN AEROBIC AND ANAEROBIC Blood Culture adequate volume   Culture   Final    NO GROWTH 3 DAYS Performed at [facility], [address] [place], [place] [address]    Report Status PENDING  Incomplete  Culture, blood (Routine X 2) w Reflex to ID Panel     Status: None (Preliminary result)   Collection Time: 03/06/20  3:41 AM     Specimen: BLOOD  Result Value Ref Range Status   Specimen Description BLOOD RIGHT ANTECUBITAL  Final   Special Requests   Final    BOTTLES DRAWN AEROBIC AND ANAEROBIC Blood Culture adequate volume   Culture   Final    NO GROWTH 3 DAYS Performed at [facility], [address], [place], [place] [address]    Report Status PENDING  Incomplete    Procedures and diagnostic studies:  No results found.  Medications:   . amLODipine  10 mg Oral Daily  . aspirin EC  81 mg Oral Daily  . atorvastatin  40 mg Oral q1800  . Chlorhexidine Gluconate Cloth  6 each Topical Daily  . collagenase   Topical Daily  . enoxaparin (LOVENOX) injection  40 mg Subcutaneous Q24H  . insulin aspart  0-5 Units Subcutaneous QHS  . insulin aspart  0-9 Units Subcutaneous TID WC  . insulin aspart  10 Units Subcutaneous TID WC  . insulin aspart  7 Units Subcutaneous Once  . insulin glargine  25 Units Subcutaneous Daily  . losartan  50 mg Oral Daily  . pantoprazole  40 mg Oral Daily  . polyethylene glycol  17 g Oral Daily  . senna-docusate  1 tablet Oral QHS   Continuous Infusions: . sodium chloride 250 mL (03/07/20 1524)  .  ceFAZolin (ANCEF) IV 2 g (03/09/20 1032)     LOS: 6 days   Mavin Dyke  [facility]     03/09/2020, 12:25 PM           Progress Note    Spencer [name]  BJY:[id number] DOB: 1961-04-21  DOA: 03/03/2020 PCP: Patient, No Pcp Per      Brief Narrative:    Medical records reviewed and are as summarized below:  Spencer [name] is an 59 y.o. male with medical history significant for type 2 diabetes mellitus, cocaine abuse, brain aneurysm, hypertension, GERD, recent discharge from the hospital on 02/17/2020 after hospitalization for DKA, AKI, cocaine intoxication, metabolic encephalopathy and respiratory failure requiring intubation.  He presented to the hospital after a fall at home. He said he fell 2 days ago when he  was trying to place a new showerhead in his  bathroom. He stood on the edge of the bathtub but unfortunately he slipped and fell while attempting to place a new showerhead. He was unable to get up after he fell. He laid on the floor for 2 days. He said he could not eat nor drink and he could not take any of his medicines including insulin. He said he has a roommate but the roommate could not help him. He said his roommate cannot even help himself. He complained of pain in his right hip that developed after the fall. He also complained of thirst, hunger, dry throat and generalized weakness      Assessment/Plan:   Principal Problem:   DKA (diabetic ketoacidoses) (HCC) Active Problems:   Hypertension   AKI (acute kidney injury) (HCC)   Fall at home   Fever   Staphylococcus aureus bacteremia with sepsis (HCC)   C4 cervical fracture (HCC)   Pressure injury of skin   DKA in a patient with type II DM: off of IV insulin infusion since 03/04/2020.  Continue Lantus and NovoLog.  Hemoglobin A1c was 12.6 on 02/15/2020.  MSSA sepsis and bacteremia: Continue IV Ancef.  No vegetations on 2D echo.  Consulted cardiologist for TEE.  Per cardiologist, patient is at very high risk for TEE procedure because of cervical fracture.  Neurosurgeon, Dr. Marcell Barlow has been consulted for clearance for TEE.  Follow-up with ID and cardiologist.  Grade 1 diastolic dysfunction and EF of 60 to 65% on 2D echo.  Asymptomatic.  Mild and stable but displaced fracture of C4 vertebral body: Conservative management with Aspen collar and pain control was recommended by neurosurgeon..  The importance of wearing the cervical collar was reiterated.  Dr. Marcell Barlow was consulted again today for clearance for TEE.  Dysphagia/coughing spells while eating: Speech therapist has been consulted for swallow evaluation.  Hyponatremia: Asymptomatic.  Stable.  Continue to monitor.  Right hip pain, s/p fall at home: No acute findings on x-ray and CT right hip. CK level was normal.  PT  and OT recommend home health therapy.  However his sister, [name], wants him to be discharged to SNF because of poor living conditions.  Follow-up with social Investment banker, operational to assist with disposition.  Hypertension: Continue amlodipine and losartan  AKI and leukocytosis: Resolved  Cocaine abuse: Counseled to quit.  Decubitus ulcers- unstageable decubitus ulcer on right elbow. unstageable midline sacral decubitus ulcer, unstageable right buttock decubitus ulcer (all present on admission).  Patient has been evaluated by wound care nurse.  Continue local wound care.  He has been encouraged to turn from side-to-side.   Pressure Injury 03/06/20 Sacrum Medial Unstageable - Full thickness tissue loss in which the base of the injury is covered by slough (yellow, tan, gray, green or brown) and/or eschar (tan, brown or black) in the wound bed. pt states this occurred when he (Active)  03/06/20 1147  Location: Sacrum (Lateral to coccyx)  Location Orientation: Medial  Staging: Unstageable - Full thickness tissue loss in which the base of the injury is covered by slough (yellow, tan, gray, green or brown) and/or eschar (tan, brown or black) in the wound bed.  Wound Description (Comments): pt states this occurred when he fell at home  Present on Admission: Yes (Present on transfer from ICU)     Pressure Injury 03/06/20 Elbow Right Unstageable - Full thickness tissue loss in which the base of the injury is covered by slough (yellow, tan, gray, green  or brown) and/or eschar (tan, brown or black) in the wound bed. pt states this occurred when he f (Active)  03/06/20 1147  Location: Elbow  Location Orientation: Right  Staging: Unstageable - Full thickness tissue loss in which the base of the injury is covered by slough (yellow, tan, gray, green or brown) and/or eschar (tan, brown or black) in the wound bed.  Wound Description (Comments): pt states this occurred when he fell at home  Present on  Admission: Yes (Present on transfer from ICU)             Body mass index is 19.55 kg/m.   Family Communication/Anticipated D/C date and plan/Code Status   DVT prophylaxis: Lovenox Code Status: Full code Family Communication: Plan discussed with patient's sister [name]. Disposition Plan:    Status is: Inpatient  Remains inpatient appropriate because:Inpatient level of care appropriate due to severity of illness   Dispo: The patient is from: Home              Anticipated d/c is to: Home              Anticipated d/c date is: 2 days              Patient currently is not medically stable to d/c.            Subjective:   He still complains of pain in the right hip and back of the neck.  No other complaints.  Objective:    Vitals:   03/08/20 0839 03/08/20 1657 03/08/20 2339 03/09/20 0733  BP: (!) 144/90 123/78 131/84 131/82  Pulse: 94 80 87 93  Resp: 18 18 16 20   Temp:  98.8 F (37.1 C) 97.8 F (36.6 C) 98.3 F (36.8 C)  TempSrc:   Oral Oral  SpO2: 98% 97% 96% 95%  Weight:      Height:       No data found.   Intake/Output Summary (Last 24 hours) at 03/09/2020 1230 Last data filed at 03/09/2020 0833 Gross per 24 hour  Intake 480 ml  Output 1700 ml  Net -1220 ml   Filed Weights   03/03/20 1240  Weight: 61.8 kg    Exam:  GEN: NAD SKIN: No rash. unstageable decubitus ulcer on right elbow. unstageable midline sacral decubitus ulcer, unstageable right buttock decubitus ulcer (all present on admission) EYES: EOMI ENT: MMM, cervical spine tenderness CV: RRR PULM: No wheezing or rales heard ABD: soft, ND, NT, +BS CNS: AAO x 3, non focal EXT: Right hip tenderness and swelling (slowly improving).  No leg edema or tenderness.   Data Reviewed:   I have personally reviewed following labs and imaging studies:  Labs: Labs show the following:   Basic Metabolic Panel: Recent Labs  Lab 03/03/20 2144 03/04/20 0411 03/05/20 0441  03/05/20 0441 03/06/20 0341 03/06/20 0341 03/07/20 0428 03/07/20 0428 03/08/20 0437 03/09/20 0854  NA 132*   < > 130*  --  132*  --  130*  --  132* 131*  K 4.9   < > 3.9   < > 3.9   < > 3.4*   < > 3.7 3.6  CL 103   < > 100  --  97*  --  97*  --  100 98  CO2 13*   < > 24  --  25  --  26  --  26 25  GLUCOSE 212*   < > 229*  --  165*  --  208*  --  195* 311*  BUN 22   < > 13  --  16  --  13  --  15 15  CREATININE 1.11   < > 0.89  --  0.85  --  0.72  --  0.61 0.84  CALCIUM 8.8*   < > 8.4*  --  8.7*  --  8.2*  --  8.4* 8.4*  MG 2.1  --   --   --   --   --  2.0  --   --   --    < > = values in this interval not displayed.   GFR Estimated Creatinine Clearance: 80.7 mL/min (by C-G formula based on SCr of 0.84 mg/dL). Liver Function Tests: Recent Labs  Lab 03/03/20 1250  AST 15  ALT 20  ALKPHOS 86  BILITOT 2.3*  PROT 7.6  ALBUMIN 3.7   No results for input(s): LIPASE, AMYLASE in the last 168 hours. No results for input(s): AMMONIA in the last 168 hours. Coagulation profile No results for input(s): INR, PROTIME in the last 168 hours.  CBC: Recent Labs  Lab 03/03/20 1250 03/04/20 1938 03/05/20 0441  WBC 14.2* 9.9 10.4  NEUTROABS  --  8.6* 9.1*  HGB 14.5 11.8* 12.6*  HCT 44.8 35.0* 36.5*  MCV 94.9 92.8 90.1  PLT 278 202 175   Cardiac Enzymes: Recent Labs  Lab 03/03/20 1249  CKTOTAL 77   BNP (last 3 results) No results for input(s): PROBNP in the last 8760 hours. CBG: Recent Labs  Lab 03/08/20 1136 03/08/20 1655 03/08/20 2119 03/09/20 0734 03/09/20 1209  GLUCAP 196* 81 166* 265* 209*   D-Dimer: No results for input(s): DDIMER in the last 72 hours. Hgb A1c: No results for input(s): HGBA1C in the last 72 hours. Lipid Profile: No results for input(s): CHOL, HDL, LDLCALC, TRIG, CHOLHDL, LDLDIRECT in the last 72 hours. Thyroid function studies: No results for input(s): TSH, T4TOTAL, T3FREE, THYROIDAB in the last 72 hours.  Invalid input(s): FREET3 Anemia  work up: No results for input(s): VITAMINB12, FOLATE, FERRITIN, TIBC, IRON, RETICCTPCT in the last 72 hours. Sepsis Labs: Recent Labs  Lab 03/03/20 1250 03/04/20 1938 03/04/20 2155 03/05/20 0441 03/06/20 0341  PROCALCITON  --  6.98  --  6.21 11.63  WBC 14.2* 9.9  --  10.4  --   LATICACIDVEN  --  2.0* 1.0  --   --     Microbiology Recent Results (from the past 240 hour(s))  Respiratory Panel by RT PCR (Flu A&B, Covid) - Nasopharyngeal Swab     Status: None   Collection Time: 03/03/20 12:50 PM   Specimen: Nasopharyngeal Swab  Result Value Ref Range Status   SARS Coronavirus 2 by RT PCR NEGATIVE NEGATIVE Final    Comment: (NOTE) SARS-CoV-2 target nucleic acids are NOT DETECTED. The SARS-CoV-2 RNA is generally detectable in upper respiratoy specimens during the acute phase of infection. The lowest concentration of SARS-CoV-2 viral copies this assay can detect is 131 copies/mL. A negative result does not preclude SARS-Cov-2 infection and should not be used as the sole basis for treatment or other patient management decisions. A negative result may occur with  improper specimen collection/handling, submission of specimen other than nasopharyngeal swab, presence of viral mutation(s) within the areas targeted by this assay, and inadequate number of viral copies (<131 copies/mL). A negative result must be combined with clinical observations, patient history, and epidemiological information. The expected result is Negative. Fact Sheet for Patients:  https://www.moore.com/ Fact Sheet for Healthcare  Providers:  https://www.young.biz/ This test is not yet ap proved or cleared by the [place] and  has been authorized for detection and/or diagnosis of SARS-CoV-2 by FDA under an Emergency Use Authorization (EUA). This EUA will remain  in effect (meaning this test can be used) for the duration of the COVID-19 declaration under Section  564(b)(1) of the Act, 21 U.S.C. section 360bbb-3(b)(1), unless the authorization is terminated or revoked sooner.    Influenza A by PCR NEGATIVE NEGATIVE Final   Influenza B by PCR NEGATIVE NEGATIVE Final    Comment: (NOTE) The Xpert Xpress SARS-CoV-2/FLU/RSV assay is intended as an aid in  the diagnosis of influenza from Nasopharyngeal swab specimens and  should not be used as a sole basis for treatment. Nasal washings and  aspirates are unacceptable for Xpert Xpress SARS-CoV-2/FLU/RSV  testing. Fact Sheet for Patients: https://www.moore.com/ Fact Sheet for Healthcare Providers: https://www.young.biz/ This test is not yet approved or cleared by the [place] FDA and  has been authorized for detection and/or diagnosis of SARS-CoV-2 by  FDA under an Emergency Use Authorization (EUA). This EUA will remain  in effect (meaning this test can be used) for the duration of the  Covid-19 declaration under Section 564(b)(1) of the Act, 21  U.S.C. section 360bbb-3(b)(1), unless the authorization is  terminated or revoked. Performed at [facility], [address] [place], [place] [address]   CULTURE, BLOOD (ROUTINE X 2) w Reflex to ID Panel     Status: Abnormal   Collection Time: 03/04/20  7:37 PM   Specimen: BLOOD  Result Value Ref Range Status   Specimen Description   Final    BLOOD BLOOD LEFT HAND Performed at [facility], [address] [place], [place] [address]    Special Requests   Final    BOTTLES DRAWN AEROBIC AND ANAEROBIC Blood Culture adequate volume Performed at [facility], [address] [place], [place] [address]    Culture  Setup Time   Final    GRAM POSITIVE COCCI IN BOTH AEROBIC AND ANAEROBIC BOTTLES CRITICAL RESULT CALLED TO, READ BACK BY AND VERIFIED WITH: SCOTT HALL AT 0612 03/05/20.PMF Performed at [facility], [address] [place], [place] [address]    Culture (A)  Final     STAPHYLOCOCCUS AUREUS SUSCEPTIBILITIES PERFORMED ON PREVIOUS CULTURE WITHIN THE LAST 5 DAYS. Performed at [facility], [address] [place], [place] [address]    Report Status 03/07/2020 FINAL  Final  CULTURE, BLOOD (ROUTINE X 2) w Reflex to ID Panel     Status: Abnormal   Collection Time: 03/04/20  7:41 PM   Specimen: BLOOD  Result Value Ref Range Status   Specimen Description   Final    BLOOD RIGHT ANTECUBITAL Performed at [facility], [address] [place], [place] [address]    Special Requests   Final    BOTTLES DRAWN AEROBIC AND ANAEROBIC Blood Culture adequate volume Performed at [facility], [address], [place], [place] [address]    Culture  Setup Time   Final    GRAM POSITIVE COCCI IN BOTH AEROBIC AND ANAEROBIC BOTTLES CRITICAL RESULT CALLED TO, READ BACK BY AND VERIFIED WITH: SCOTT HALL AT 0612 03/05/20.PMF Performed at [facility], [address], [place], [place] [address]    Culture STAPHYLOCOCCUS AUREUS (A)  Final   Report Status 03/07/2020 FINAL  Final   Organism ID, Bacteria STAPHYLOCOCCUS AUREUS  Final  Susceptibility   Staphylococcus aureus - MIC*    CIPROFLOXACIN <=0.5 SENSITIVE Sensitive     ERYTHROMYCIN RESISTANT Resistant     GENTAMICIN <=0.5 SENSITIVE Sensitive     OXACILLIN 0.5 SENSITIVE Sensitive     TETRACYCLINE <=1 SENSITIVE Sensitive     VANCOMYCIN <=0.5 SENSITIVE Sensitive     TRIMETH/SULFA <=10 SENSITIVE Sensitive     CLINDAMYCIN RESISTANT Resistant     RIFAMPIN <=0.5 SENSITIVE Sensitive     Inducible Clindamycin POSITIVE Resistant     * STAPHYLOCOCCUS AUREUS  Blood Culture ID Panel (Reflexed)     Status: Abnormal   Collection Time: 03/04/20  7:41 PM  Result Value Ref Range Status   Enterococcus species NOT DETECTED NOT DETECTED Final   Listeria monocytogenes NOT DETECTED NOT DETECTED Final   Staphylococcus species DETECTED (A) NOT DETECTED Final    Comment: CRITICAL RESULT CALLED TO, READ  BACK BY AND VERIFIED WITH: SCOTT HALL AT 0612 03/05/20.PMF    Staphylococcus aureus (BCID) DETECTED (A) NOT DETECTED Final    Comment: Methicillin (oxacillin) susceptible Staphylococcus aureus (MSSA). Preferred therapy is anti staphylococcal beta lactam antibiotic (Cefazolin or Nafcillin), unless clinically contraindicated. CRITICAL RESULT CALLED TO, READ BACK BY AND VERIFIED WITH: SCOTT HALL AT 0612 03/05/20.PMF    Methicillin resistance NOT DETECTED NOT DETECTED Final   Streptococcus species NOT DETECTED NOT DETECTED Final   Streptococcus agalactiae NOT DETECTED NOT DETECTED Final   Streptococcus pneumoniae NOT DETECTED NOT DETECTED Final   Streptococcus pyogenes NOT DETECTED NOT DETECTED Final   Acinetobacter baumannii NOT DETECTED NOT DETECTED Final   Enterobacteriaceae species NOT DETECTED NOT DETECTED Final   Enterobacter cloacae complex NOT DETECTED NOT DETECTED Final   Escherichia coli NOT DETECTED NOT DETECTED Final   Klebsiella oxytoca NOT DETECTED NOT DETECTED Final   Klebsiella pneumoniae NOT DETECTED NOT DETECTED Final   Proteus species NOT DETECTED NOT DETECTED Final   Serratia marcescens NOT DETECTED NOT DETECTED Final   Haemophilus influenzae NOT DETECTED NOT DETECTED Final   Neisseria meningitidis NOT DETECTED NOT DETECTED Final   Pseudomonas aeruginosa NOT DETECTED NOT DETECTED Final   Candida albicans NOT DETECTED NOT DETECTED Final   Candida glabrata NOT DETECTED NOT DETECTED Final   Candida krusei NOT DETECTED NOT DETECTED Final   Candida parapsilosis NOT DETECTED NOT DETECTED Final   Candida tropicalis NOT DETECTED NOT DETECTED Final    Comment: Performed at [facility], [address] [place], [place] [address]  Culture, blood (Routine X 2) w Reflex to ID Panel     Status: None (Preliminary result)   Collection Time: 03/06/20 12:11 AM   Specimen: BLOOD  Result Value Ref Range Status   Specimen Description BLOOD BLOOD LEFT HAND  Final   Special  Requests   Final    BOTTLES DRAWN AEROBIC AND ANAEROBIC Blood Culture adequate volume   Culture   Final    NO GROWTH 3 DAYS Performed at [facility], [address], [place], [place] [address]    Report Status PENDING  Incomplete  Culture, blood (Routine X 2) w Reflex to ID Panel     Status: None (Preliminary result)   Collection Time: 03/06/20  3:41 AM   Specimen: BLOOD  Result Value Ref Range Status   Specimen Description BLOOD RIGHT ANTECUBITAL  Final   Special Requests   Final    BOTTLES DRAWN AEROBIC AND ANAEROBIC Blood Culture adequate volume   Culture   Final    NO GROWTH 3 DAYS Performed at  [facility], [address] [place], [place] [address]    Report Status PENDING  Incomplete    Procedures and diagnostic studies:  No results found.  Medications:   . amLODipine  10 mg Oral Daily  . aspirin EC  81 mg Oral Daily  . atorvastatin  40 mg Oral q1800  . Chlorhexidine Gluconate Cloth  6 each Topical Daily  . collagenase   Topical Daily  . enoxaparin (LOVENOX) injection  40 mg Subcutaneous Q24H  . insulin aspart  0-5 Units Subcutaneous QHS  . insulin aspart  0-9 Units Subcutaneous TID WC  . insulin aspart  10 Units Subcutaneous TID WC  . insulin aspart  7 Units Subcutaneous Once  . insulin glargine  25 Units Subcutaneous Daily  . losartan  50 mg Oral Daily  . pantoprazole  40 mg Oral Daily  . polyethylene glycol  17 g Oral Daily  . senna-docusate  1 tablet Oral QHS   Continuous Infusions: . sodium chloride 250 mL (03/07/20 1524)  .  ceFAZolin (ANCEF) IV 2 g (03/09/20 1032)     LOS: 6 days   Norine Reddington  [facility]     03/09/2020, 12:30 PM

## 2020-03-09 NOTE — Progress Notes (Signed)
Physical Therapy Treatment Patient Details Name: Spencer [name] MRN: [id number] DOB: January 11, 1961 Today's Date: 03/09/2020    History of Present Illness Pt admitted for complaints of fall while replacing showerhead in bathroom and laid in floor x 3 days. HIstory includes DM, cocaine abuse, brain aneurysm, HTN, and GERD. Recent admission for similar symptoms. Imaging negative at this time. Now admitted for DKA.    PT Comments    Pt was supine in bed upon arriving, he required max encouragement but does agree to PT session. He also agrees to wearing aspen cervical collar which was applied in supine prior to log roll to short sit. He stood to RW with CGA. Vcs throughout session for safety, sequencing, and technique. He ambulated 50 ft with RW without LOB or difficulty. Limited distance 2/2 to pt's willingness to participate. He was repositioned supine in bed at conclusion of session and was unwilling to keep aspen collar donned. Acute PT will continue to follow per POC and progress as able per pt tolerance. Recommending DC to home with HHPT to follow to address balance, strength, and safe functional mobility deficits.      Follow Up Recommendations  Home health PT;Supervision for mobility/OOB     Equipment Recommendations  Rolling walker with 5" wheels    Recommendations for Other Services       Precautions / Restrictions Precautions Precautions: Fall Required Braces or Orthoses: Cervical Brace Cervical Brace: Hard collar(aspen collar) Restrictions Weight Bearing Restrictions: No    Mobility  Bed Mobility Overal bed mobility: Needs Assistance Bed Mobility: Sidelying to Sit;Supine to Sit;Sit to Sidelying;Sit to Supine   Sidelying to sit: Min guard Supine to sit: Min assist Sit to supine: Min assist Sit to sidelying: Min guard General bed mobility comments: Pt was able to perform log roll in out of bed with min assist + max vcs for tehcnique and sequencing.   Transfers Overall  transfer level: Needs assistance Equipment used: Rolling walker (2 wheeled) Transfers: Sit to/from Stand Sit to Stand: Min guard         General transfer comment: CGA for safety with vcs for handplacement and technique improvements  Ambulation/Gait Ambulation/Gait assistance: Min guard Gait Distance (Feet): 50 Feet Assistive device: Rolling walker (2 wheeled) Gait Pattern/deviations: Narrow base of support;Step-through pattern Gait velocity: decreased   General Gait Details: Pt was able to ambulate 50 ft without LOB or unsteadiness.   Stairs             Wheelchair Mobility    Modified Rankin (Stroke Patients Only)       Balance                                            Cognition Arousal/Alertness: Awake/alert Behavior During Therapy: WFL for tasks assessed/performed Overall Cognitive Status: Within Functional Limits for tasks assessed                                 General Comments: Pt is frustrated throughout session with having to stay in hospital however does agree to PT session with encouragement.      Exercises      General Comments        Pertinent Vitals/Pain Pain Assessment: 0-10 Pain Score: 10-Worst pain ever Pain Location: post neck/head and R hip Pain Descriptors / Indicators: Aching;Discomfort;Dull  Pain Intervention(s): Limited activity within patient's tolerance;Monitored during session;Premedicated before session;Repositioned    Home Living                      Prior Function            PT Goals (current goals can now be found in the care plan section) Acute Rehab PT Goals Patient Stated Goal: To go home Progress towards PT goals: Progressing toward goals    Frequency    Min 2X/week      PT Plan Current plan remains appropriate    Co-evaluation              AM-PAC PT "6 Clicks" Mobility   Outcome Measure  Help needed turning from your back to your side while in a flat  bed without using bedrails?: A Little Help needed moving from lying on your back to sitting on the side of a flat bed without using bedrails?: A Little Help needed moving to and from a bed to a chair (including a wheelchair)?: A Little Help needed standing up from a chair using your arms (e.g., wheelchair or bedside chair)?: A Little Help needed to walk in hospital room?: A Little Help needed climbing 3-5 steps with a railing? : A Little 6 Click Score: 18    End of Session Equipment Utilized During Treatment: Cervical collar Activity Tolerance: Patient tolerated treatment well Patient left: in bed Nurse Communication: Mobility status PT Visit Diagnosis: History of falling (Z91.81);Difficulty in walking, not elsewhere classified (R26.2);Pain Pain - Right/Left: Right Pain - part of body: Hip     Time: 4481-8563 PT Time Calculation (min) (ACUTE ONLY): 14 min  Charges:  $Gait Training: 8-22 mins                     Julaine Fusi PTA 03/09/20, 4:53 PM

## 2020-03-10 SURGERY — ECHOCARDIOGRAM, TRANSESOPHAGEAL
Anesthesia: Choice

## 2020-03-10 NOTE — Progress Notes (Signed)
Received call from charge RN stating patient wanted to leave AMA, he had called a ride and packed his belongings. Notified charge not to allow patient to leave until I could notify provider to come see the patient. Notified E. Ouma NP regarding situation. We both went to the bedside to assess situation.

## 2020-03-10 NOTE — Discharge Summary (Addendum)
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 patient history, and epidemiological information. The expected result is Negative. Fact Sheet for Patients:  https://www.moore.com/ Fact Sheet for Healthcare Providers:  https://www.young.biz/ This test is not yet ap proved or cleared by the [place] FDA and  has been authorized for detection and/or diagnosis of SARS-CoV-2 by FDA under an Emergency Use Authorization (EUA). This EUA will remain  in effect (meaning this test can be used) for the duration of the COVID-19 declaration under Section 564(b)(1) of the Act, 21 U.S.C. section 360bbb-3(b)(1), unless the authorization is terminated or revoked sooner.    Influenza A by PCR NEGATIVE NEGATIVE Final   Influenza B by PCR NEGATIVE NEGATIVE Final    Comment: (NOTE) The Xpert Xpress SARS-CoV-2/FLU/RSV assay is intended as an aid in  the diagnosis of influenza from Nasopharyngeal swab specimens and  should not be used as a sole basis for treatment. Nasal washings and  aspirates are unacceptable for Xpert Xpress SARS-CoV-2/FLU/RSV  testing. Fact Sheet for Patients: https://www.moore.com/ Fact Sheet for Healthcare  Providers: https://www.young.biz/ This test is not yet approved or cleared by the [place] FDA and  has been authorized for detection and/or diagnosis of SARS-CoV-2 by  FDA under an Emergency Use Authorization (EUA). This EUA will remain  in effect (meaning this test can be used) for the duration of the  Covid-19 declaration under Section 564(b)(1) of the Act, 21  U.S.C. section 360bbb-3(b)(1), unless the authorization is  terminated or revoked. Performed at [facility], [address] [place], [place] [address]   CULTURE, BLOOD (ROUTINE X 2) w Reflex to ID Panel     Status: Abnormal   Collection Time: 03/04/20  7:37 PM   Specimen: BLOOD  Result Value Ref Range Status   Specimen Description   Final    BLOOD BLOOD LEFT HAND Performed at [facility], [address] [place], [place] [address]    Special Requests   Final    BOTTLES DRAWN AEROBIC AND ANAEROBIC Blood Culture adequate volume Performed at [facility], [address] [place], [place] [address]    Culture  Setup Time   Final    GRAM POSITIVE COCCI IN BOTH AEROBIC AND ANAEROBIC BOTTLES CRITICAL RESULT CALLED TO, READ BACK BY AND  VERIFIED WITH: SCOTT HALL AT 0612 03/05/20.PMF Performed at [facility], [address] [place], [place] [address]    Culture (A)  Final    STAPHYLOCOCCUS AUREUS SUSCEPTIBILITIES PERFORMED ON PREVIOUS CULTURE WITHIN THE LAST 5 DAYS. Performed at [facility], [address], [place], [place] [address]    Report Status 03/07/2020 FINAL  Final  CULTURE, BLOOD (ROUTINE X 2) w Reflex to ID Panel     Status: Abnormal   Collection Time: 03/04/20  7:41 PM   Specimen: BLOOD  Result Value Ref Range Status   Specimen Description   Final    BLOOD RIGHT ANTECUBITAL Performed at [facility], [address], [place], [place] [address]    Special Requests   Final    BOTTLES DRAWN AEROBIC AND ANAEROBIC Blood Culture  adequate volume Performed at [facility], [address] [place], [place] [address]    Culture  Setup Time   Final    GRAM POSITIVE COCCI IN BOTH AEROBIC AND ANAEROBIC BOTTLES CRITICAL RESULT CALLED TO, READ BACK BY AND VERIFIED WITH: SCOTT HALL AT 0612 03/05/20.PMF Performed at [facility], [address], [place], [place] [address]    Culture STAPHYLOCOCCUS AUREUS (A)  Final   Report Status 03/07/2020 FINAL  Final   Organism ID, Bacteria STAPHYLOCOCCUS AUREUS  Final      Susceptibility   Staphylococcus aureus - MIC*    CIPROFLOXACIN <=0.5 SENSITIVE Sensitive     ERYTHROMYCIN RESISTANT Resistant     GENTAMICIN <=0.5 SENSITIVE Sensitive     OXACILLIN 0.5 SENSITIVE Sensitive     TETRACYCLINE <=1 SENSITIVE Sensitive     VANCOMYCIN <=0.5 SENSITIVE Sensitive     TRIMETH/SULFA <=10 SENSITIVE Sensitive     CLINDAMYCIN RESISTANT Resistant     RIFAMPIN <=0.5 SENSITIVE Sensitive     Inducible Clindamycin POSITIVE Resistant     * STAPHYLOCOCCUS AUREUS  Blood Culture ID Panel (Reflexed)     Status: Abnormal   Collection Time: 03/04/20  7:41 PM  Result Value Ref Range Status   Enterococcus species NOT DETECTED NOT DETECTED Final   Listeria monocytogenes NOT DETECTED NOT DETECTED Final   Staphylococcus species DETECTED (A) NOT DETECTED Final    Comment: CRITICAL RESULT CALLED TO, READ BACK BY AND VERIFIED WITH: SCOTT HALL AT 0612 03/05/20.PMF    Staphylococcus aureus (BCID) DETECTED (A) NOT DETECTED Final    Comment: Methicillin (oxacillin) susceptible Staphylococcus aureus (MSSA). Preferred therapy is anti staphylococcal beta lactam antibiotic (Cefazolin or Nafcillin), unless clinically contraindicated. CRITICAL RESULT CALLED TO, READ BACK BY AND VERIFIED WITH: SCOTT HALL AT 0612 03/05/20.PMF    Methicillin resistance NOT DETECTED NOT DETECTED Final   Streptococcus species NOT DETECTED NOT DETECTED Final   Streptococcus agalactiae NOT DETECTED NOT DETECTED Final    Streptococcus pneumoniae NOT DETECTED NOT DETECTED Final   Streptococcus pyogenes NOT DETECTED NOT DETECTED Final   Acinetobacter baumannii NOT DETECTED NOT DETECTED Final   Enterobacteriaceae species NOT DETECTED NOT DETECTED Final   Enterobacter cloacae complex NOT DETECTED NOT DETECTED Final   Escherichia coli NOT DETECTED NOT DETECTED Final   Klebsiella oxytoca NOT DETECTED NOT DETECTED Final   Klebsiella pneumoniae NOT DETECTED NOT DETECTED Final   Proteus species NOT DETECTED NOT DETECTED Final   Serratia marcescens NOT DETECTED NOT DETECTED Final   Haemophilus influenzae NOT DETECTED NOT DETECTED Final   Neisseria meningitidis NOT DETECTED NOT DETECTED Final   Pseudomonas aeruginosa NOT DETECTED NOT DETECTED Final   Candida  albicans NOT DETECTED NOT DETECTED Final   Candida glabrata NOT DETECTED NOT DETECTED Final   Candida krusei NOT DETECTED NOT DETECTED Final   Candida parapsilosis NOT DETECTED NOT DETECTED Final   Candida tropicalis NOT DETECTED NOT DETECTED Final    Comment: Performed at [facility], [address] [place], [place] [address]  Culture, blood (Routine X 2) w Reflex to ID Panel     Status: None (Preliminary result)   Collection Time: 03/06/20 12:11 AM   Specimen: BLOOD  Result Value Ref Range Status   Specimen Description BLOOD BLOOD LEFT HAND  Final   Special Requests   Final    BOTTLES DRAWN AEROBIC AND ANAEROBIC Blood Culture adequate volume   Culture   Final    NO GROWTH 3 DAYS Performed at [facility], [address] [place], [place] [address]    Report Status PENDING  Incomplete  Culture, blood (Routine X 2) w Reflex to ID Panel     Status: None (Preliminary result)   Collection Time: 03/06/20  3:41 AM   Specimen: BLOOD  Result Value Ref Range Status   Specimen Description BLOOD RIGHT ANTECUBITAL  Final   Special Requests   Final    BOTTLES DRAWN AEROBIC AND ANAEROBIC Blood Culture adequate volume   Culture   Final    NO  GROWTH 3 DAYS Performed at [facility], [address] [place], [place] [address]    Report Status PENDING  Incomplete     Labs: BNP (last 3 results) Recent Labs    07/27/19 1741 08/06/19 2252 03/04/20 1938  BNP 122.0* 101.0* 120.0*   Basic Metabolic Panel: Recent Labs  Lab 03/03/20 2144 03/04/20 0411 03/05/20 0441 03/06/20 0341 03/07/20 0428 03/08/20 0437 03/09/20 0854  NA 132*   < > 130* 132* 130* 132* 131*  K 4.9   < > 3.9 3.9 3.4* 3.7 3.6  CL 103   < > 100 97* 97* 100 98  CO2 13*   < > 24 25 26 26 25   GLUCOSE 212*   < > 229* 165* 208* 195* 311*  BUN 22   < > 13 16 13 15 15   CREATININE 1.11   < > 0.89 0.85 0.72 0.61 0.84  CALCIUM 8.8*   < > 8.4* 8.7* 8.2* 8.4* 8.4*  MG 2.1  --   --   --  2.0  --   --    < > = values in this interval not displayed.   Liver Function Tests: Recent Labs  Lab 03/03/20 1250  AST 15  ALT 20  ALKPHOS 86  BILITOT 2.3*  PROT 7.6  ALBUMIN 3.7   No results for input(s): LIPASE, AMYLASE in the last 168 hours. No results for input(s): AMMONIA in the last 168 hours. CBC: Recent Labs  Lab 03/03/20 1250 03/04/20 1938 03/05/20 0441  WBC 14.2* 9.9 10.4  NEUTROABS  --  8.6* 9.1*  HGB 14.5 11.8* 12.6*  HCT 44.8 35.0* 36.5*  MCV 94.9 92.8 90.1  PLT 278 202 175   Cardiac Enzymes: Recent Labs  Lab 03/03/20 1249  CKTOTAL 77   BNP: Invalid input(s): POCBNP CBG: Recent Labs  Lab 03/08/20 2119 03/09/20 0734 03/09/20 1209 03/09/20 1644 03/09/20 2053  GLUCAP 166* 265* 209* 174* 225*   D-Dimer No results for input(s): DDIMER in the last 72 hours. Hgb A1c No results for input(s): HGBA1C in the last 72 hours. Lipid Profile No results for input(s): CHOL, HDL, LDLCALC, TRIG,  CHOLHDL, LDLDIRECT in the last 72 hours. Thyroid function studies No results for input(s): TSH, T4TOTAL, T3FREE, THYROIDAB in the last 72 hours.  Invalid input(s): FREET3 Anemia work up No results for input(s): VITAMINB12, FOLATE, FERRITIN,  TIBC, IRON, RETICCTPCT in the last 72 hours. Urinalysis    Component Value Date/Time   COLORURINE YELLOW (A) 03/03/2020 1249   APPEARANCEUR CLEAR (A) 03/03/2020 1249   LABSPEC 1.026 03/03/2020 1249   PHURINE 5.0 03/03/2020 1249   GLUCOSEU >=500 (A) 03/03/2020 1249   HGBUR NEGATIVE 03/03/2020 1249   BILIRUBINUR NEGATIVE 03/03/2020 1249   KETONESUR 80 (A) 03/03/2020 1249   PROTEINUR 30 (A) 03/03/2020 1249   NITRITE NEGATIVE 03/03/2020 1249   LEUKOCYTESUR NEGATIVE 03/03/2020 1249   Sepsis Labs Invalid input(s): PROCALCITONIN,  WBC,  LACTICIDVEN Microbiology Recent Results (from the past 240 hour(s))  Respiratory Panel by RT PCR (Flu A&B, Covid) - Nasopharyngeal Swab     Status: None   Collection Time: 03/03/20 12:50 PM   Specimen: Nasopharyngeal Swab  Result Value Ref Range Status   SARS Coronavirus 2 by RT PCR NEGATIVE NEGATIVE Final    Comment: (NOTE) SARS-CoV-2 target nucleic acids are NOT DETECTED. The SARS-CoV-2 RNA is generally detectable in upper respiratoy specimens during the acute phase of infection. The lowest concentration of SARS-CoV-2 viral copies this assay can detect is 131 copies/mL. A negative result does not preclude SARS-Cov-2 infection and should not be used as the sole basis for treatment or other patient management decisions. A negative result may occur with  improper specimen collection/handling, submission of specimen other than nasopharyngeal swab, presence of viral mutation(s) within the areas targeted by this assay, and inadequate number of viral copies (<131 copies/mL). A negative result must be combined with clinical observations, patient history, and epidemiological information. The expected result is Negative. Fact Sheet for Patients:  https://www.moore.com/ Fact Sheet for Healthcare Providers:  https://www.young.biz/ This test is not yet ap proved or cleared by the [place] FDA and  has been  authorized for detection and/or diagnosis of SARS-CoV-2 by FDA under an Emergency Use Authorization (EUA). This EUA will remain  in effect (meaning this test can be used) for the duration of the COVID-19 declaration under Section 564(b)(1) of the Act, 21 U.S.C. section 360bbb-3(b)(1), unless the authorization is terminated or revoked sooner.    Influenza A by PCR NEGATIVE NEGATIVE Final   Influenza B by PCR NEGATIVE NEGATIVE Final    Comment: (NOTE) The Xpert Xpress SARS-CoV-2/FLU/RSV assay is intended as an aid in  the diagnosis of influenza from Nasopharyngeal swab specimens and  should not be used as a sole basis for treatment. Nasal washings and  aspirates are unacceptable for Xpert Xpress SARS-CoV-2/FLU/RSV  testing. Fact Sheet for Patients: https://www.moore.com/ Fact Sheet for Healthcare Providers: https://www.young.biz/ This test is not yet approved or cleared by the [place] FDA and  has been authorized for detection and/or diagnosis of SARS-CoV-2 by  FDA under an Emergency Use Authorization (EUA). This EUA will remain  in effect (meaning this test can be used) for the duration of the  Covid-19 declaration under Section 564(b)(1) of the Act, 21  U.S.C. section 360bbb-3(b)(1), unless the authorization is  terminated or revoked. Performed at [facility], [address] [place], [place] [address]   CULTURE, BLOOD (ROUTINE X 2) w Reflex to ID Panel     Status: Abnormal   Collection Time: 03/04/20  7:37 PM   Specimen: BLOOD  Result Value Ref Range Status   Specimen  Description   Final    BLOOD BLOOD LEFT HAND Performed at [facility], [address] [place], [place] [address]    Special Requests   Final    BOTTLES DRAWN AEROBIC AND ANAEROBIC Blood Culture adequate volume Performed at [facility], [address] [place], [place] [address]    Culture  Setup Time   Final    GRAM POSITIVE  COCCI IN BOTH AEROBIC AND ANAEROBIC BOTTLES CRITICAL RESULT CALLED TO, READ BACK BY AND VERIFIED WITH: SCOTT HALL AT 0612 03/05/20.PMF Performed at [facility], [address] [place], [place] [address]    Culture (A)  Final    STAPHYLOCOCCUS AUREUS SUSCEPTIBILITIES PERFORMED ON PREVIOUS CULTURE WITHIN THE LAST 5 DAYS. Performed at [facility], [address], [place], [place] [address]    Report Status 03/07/2020 FINAL  Final  CULTURE, BLOOD (ROUTINE X 2) w Reflex to ID Panel     Status: Abnormal   Collection Time: 03/04/20  7:41 PM   Specimen: BLOOD  Result Value Ref Range Status   Specimen Description   Final    BLOOD RIGHT ANTECUBITAL Performed at [facility], [address], [place], [place] [address]    Special Requests   Final    BOTTLES DRAWN AEROBIC AND ANAEROBIC Blood Culture adequate volume Performed at [facility], [address], [place], [place] [address]    Culture  Setup Time   Final    GRAM POSITIVE COCCI IN BOTH AEROBIC AND ANAEROBIC BOTTLES CRITICAL RESULT CALLED TO, READ BACK BY AND VERIFIED WITH: SCOTT HALL AT 0612 03/05/20.PMF Performed at [facility], [address] [place], [place] [address]    Culture STAPHYLOCOCCUS AUREUS (A)  Final   Report Status 03/07/2020 FINAL  Final   Organism ID, Bacteria STAPHYLOCOCCUS AUREUS  Final      Susceptibility   Staphylococcus aureus - MIC*    CIPROFLOXACIN <=0.5 SENSITIVE Sensitive     ERYTHROMYCIN RESISTANT Resistant     GENTAMICIN <=0.5 SENSITIVE Sensitive     OXACILLIN 0.5 SENSITIVE Sensitive     TETRACYCLINE <=1 SENSITIVE Sensitive     VANCOMYCIN <=0.5 SENSITIVE Sensitive     TRIMETH/SULFA <=10 SENSITIVE Sensitive     CLINDAMYCIN RESISTANT Resistant     RIFAMPIN <=0.5 SENSITIVE Sensitive     Inducible Clindamycin POSITIVE Resistant     * STAPHYLOCOCCUS AUREUS  Blood Culture ID Panel (Reflexed)     Status: Abnormal   Collection Time: 03/04/20  7:41 PM  Result  Value Ref Range Status   Enterococcus species NOT DETECTED NOT DETECTED Final   Listeria monocytogenes NOT DETECTED NOT DETECTED Final   Staphylococcus species DETECTED (A) NOT DETECTED Final    Comment: CRITICAL RESULT CALLED TO, READ BACK BY AND VERIFIED WITH: SCOTT HALL AT 0612 03/05/20.PMF    Staphylococcus aureus (BCID) DETECTED (A) NOT DETECTED Final    Comment: Methicillin (oxacillin) susceptible Staphylococcus aureus (MSSA). Preferred therapy is anti staphylococcal beta lactam antibiotic (Cefazolin or Nafcillin), unless clinically contraindicated. CRITICAL RESULT CALLED TO, READ BACK BY AND VERIFIED WITH: SCOTT HALL AT 0612 03/05/20.PMF    Methicillin resistance NOT DETECTED NOT DETECTED Final   Streptococcus species NOT DETECTED NOT DETECTED Final   Streptococcus agalactiae NOT DETECTED NOT DETECTED Final   Streptococcus pneumoniae NOT DETECTED NOT DETECTED Final   Streptococcus pyogenes NOT DETECTED NOT DETECTED Final   Acinetobacter baumannii NOT DETECTED NOT DETECTED Final   Enterobacteriaceae species NOT DETECTED NOT DETECTED Final  Enterobacter cloacae complex NOT DETECTED NOT DETECTED Final   Escherichia coli NOT DETECTED NOT DETECTED Final   Klebsiella oxytoca NOT DETECTED NOT DETECTED Final   Klebsiella pneumoniae NOT DETECTED NOT DETECTED Final   Proteus species NOT DETECTED NOT DETECTED Final   Serratia marcescens NOT DETECTED NOT DETECTED Final   Haemophilus influenzae NOT DETECTED NOT DETECTED Final   Neisseria meningitidis NOT DETECTED NOT DETECTED Final   Pseudomonas aeruginosa NOT DETECTED NOT DETECTED Final   Candida albicans NOT DETECTED NOT DETECTED Final   Candida glabrata NOT DETECTED NOT DETECTED Final   Candida krusei NOT DETECTED NOT DETECTED Final   Candida parapsilosis NOT DETECTED NOT DETECTED Final   Candida tropicalis NOT DETECTED NOT DETECTED Final    Comment: Performed at [facility], [address] [place], [place] [address]   Culture, blood (Routine X 2) w Reflex to ID Panel     Status: None (Preliminary result)   Collection Time: 03/06/20 12:11 AM   Specimen: BLOOD  Result Value Ref Range Status   Specimen Description BLOOD BLOOD LEFT HAND  Final   Special Requests   Final    BOTTLES DRAWN AEROBIC AND ANAEROBIC Blood Culture adequate volume   Culture   Final    NO GROWTH 3 DAYS Performed at [facility], [address], [place], [place] [address]    Report Status PENDING  Incomplete  Culture, blood (Routine X 2) w Reflex to ID Panel     Status: None (Preliminary result)   Collection Time: 03/06/20  3:41 AM   Specimen: BLOOD  Result Value Ref Range Status   Specimen Description BLOOD RIGHT ANTECUBITAL  Final   Special Requests   Final    BOTTLES DRAWN AEROBIC AND ANAEROBIC Blood Culture adequate volume   Culture   Final    NO GROWTH 3 DAYS Performed at [facility], [address] [place], [place] [address]    Report Status PENDING  Incomplete     Time coordinating discharge: <30 minutes  SIGNED:   Andris Baumann, MD  [facility] 03/10/2020, 12:12 AM Pager   If 7PM-7AM, please contact night-coverage [email or web address] Password [email or web address]

## 2020-03-10 NOTE — Progress Notes (Signed)
This RN was in the [facility] office talking with security when patient was observed walking past office. Patient ambulating with walker. Patient attempting to use the water fountain. Communicated to patient it was not available. He states it works I just need to get the lid off. Patient asked where he was going. Patient began cussing and states "why is everyone on my ass." Discussed with patient the he could not be observed by staff in the hallway and we needed to ensure he was safe. Patient finally turned back in the direction of his room and began walking back still cursing and grumbling. Patient was unable to find his room independently. He sat on the bench in the hallway. At that point charge RN and nurse tech assumed care of patient. This RN went to discuss patient behavior with primary RN. She states she is aware. Suggestions given to ensure bed alarm was being used or tele sitter to monitor when patient left the room since he was so far away from the desk. Same information shared with charge nurse.Charge nurse and tech able to get patient safety back in room.

## 2020-03-10 NOTE — Progress Notes (Signed)
Notified security about patient leaving AMA and request for security escort with staff due to harassing nature of visitor picking up patient. Visitor had called patient room multiple times, cursed at and threatened provider via phone. Patient taken via wheelchair to medical mall entrance where he was assisted into the car. Security officers present. No further issues.

## 2020-03-11 LAB — CULTURE, BLOOD (ROUTINE X 2)
Culture: NO GROWTH
Culture: NO GROWTH
Special Requests: ADEQUATE
Special Requests: ADEQUATE

## 2020-03-13 ENCOUNTER — Inpatient Hospital Stay
Admission: EM | Admit: 2020-03-13 | Discharge: 2020-04-28 | DRG: 628 | Disposition: A | Discharge status: Skilled Nursing Facility | Payer: Medicaid Other | Attending: Internal Medicine | Admitting: Internal Medicine

## 2020-03-13 ENCOUNTER — Emergency Department: Payer: Medicaid Other

## 2020-03-13 ENCOUNTER — Encounter: Payer: Self-pay | Admitting: *Deleted

## 2020-03-13 ENCOUNTER — Other Ambulatory Visit: Payer: Self-pay

## 2020-03-13 DIAGNOSIS — E11649 Type 2 diabetes mellitus with hypoglycemia without coma: Secondary | ICD-10-CM | POA: Diagnosis not present

## 2020-03-13 DIAGNOSIS — E43 Unspecified severe protein-calorie malnutrition: Secondary | ICD-10-CM | POA: Diagnosis present

## 2020-03-13 DIAGNOSIS — M25512 Pain in left shoulder: Secondary | ICD-10-CM | POA: Diagnosis present

## 2020-03-13 DIAGNOSIS — F17210 Nicotine dependence, cigarettes, uncomplicated: Secondary | ICD-10-CM | POA: Diagnosis present

## 2020-03-13 DIAGNOSIS — R441 Visual hallucinations: Secondary | ICD-10-CM | POA: Diagnosis not present

## 2020-03-13 DIAGNOSIS — B9561 Methicillin susceptible Staphylococcus aureus infection as the cause of diseases classified elsewhere: Secondary | ICD-10-CM | POA: Diagnosis present

## 2020-03-13 DIAGNOSIS — Z8679 Personal history of other diseases of the circulatory system: Secondary | ICD-10-CM

## 2020-03-13 DIAGNOSIS — R7881 Bacteremia: Secondary | ICD-10-CM

## 2020-03-13 DIAGNOSIS — E119 Type 2 diabetes mellitus without complications: Secondary | ICD-10-CM

## 2020-03-13 DIAGNOSIS — M4622 Osteomyelitis of vertebra, cervical region: Secondary | ICD-10-CM | POA: Diagnosis present

## 2020-03-13 DIAGNOSIS — E860 Dehydration: Secondary | ICD-10-CM | POA: Diagnosis present

## 2020-03-13 DIAGNOSIS — E1149 Type 2 diabetes mellitus with other diabetic neurological complication: Secondary | ICD-10-CM | POA: Diagnosis not present

## 2020-03-13 DIAGNOSIS — M8468XA Pathological fracture in other disease, other site, initial encounter for fracture: Secondary | ICD-10-CM | POA: Diagnosis present

## 2020-03-13 DIAGNOSIS — S12300D Unspecified displaced fracture of fourth cervical vertebra, subsequent encounter for fracture with routine healing: Secondary | ICD-10-CM | POA: Diagnosis not present

## 2020-03-13 DIAGNOSIS — F1120 Opioid dependence, uncomplicated: Secondary | ICD-10-CM | POA: Diagnosis present

## 2020-03-13 DIAGNOSIS — I671 Cerebral aneurysm, nonruptured: Secondary | ICD-10-CM | POA: Diagnosis present

## 2020-03-13 DIAGNOSIS — F411 Generalized anxiety disorder: Secondary | ICD-10-CM | POA: Diagnosis present

## 2020-03-13 DIAGNOSIS — E1140 Type 2 diabetes mellitus with diabetic neuropathy, unspecified: Secondary | ICD-10-CM | POA: Diagnosis present

## 2020-03-13 DIAGNOSIS — E876 Hypokalemia: Secondary | ICD-10-CM | POA: Diagnosis not present

## 2020-03-13 DIAGNOSIS — E1010 Type 1 diabetes mellitus with ketoacidosis without coma: Secondary | ICD-10-CM

## 2020-03-13 DIAGNOSIS — K219 Gastro-esophageal reflux disease without esophagitis: Secondary | ICD-10-CM | POA: Diagnosis present

## 2020-03-13 DIAGNOSIS — G9341 Metabolic encephalopathy: Secondary | ICD-10-CM | POA: Diagnosis present

## 2020-03-13 DIAGNOSIS — N179 Acute kidney failure, unspecified: Secondary | ICD-10-CM | POA: Diagnosis present

## 2020-03-13 DIAGNOSIS — J810 Acute pulmonary edema: Secondary | ICD-10-CM | POA: Diagnosis not present

## 2020-03-13 DIAGNOSIS — M4854XA Collapsed vertebra, not elsewhere classified, thoracic region, initial encounter for fracture: Secondary | ICD-10-CM | POA: Diagnosis present

## 2020-03-13 DIAGNOSIS — E1110 Type 2 diabetes mellitus with ketoacidosis without coma: Secondary | ICD-10-CM | POA: Diagnosis present

## 2020-03-13 DIAGNOSIS — Z419 Encounter for procedure for purposes other than remedying health state, unspecified: Secondary | ICD-10-CM

## 2020-03-13 DIAGNOSIS — F319 Bipolar disorder, unspecified: Secondary | ICD-10-CM | POA: Diagnosis present

## 2020-03-13 DIAGNOSIS — F6089 Other specific personality disorders: Secondary | ICD-10-CM | POA: Diagnosis present

## 2020-03-13 DIAGNOSIS — M4624 Osteomyelitis of vertebra, thoracic region: Secondary | ICD-10-CM | POA: Diagnosis present

## 2020-03-13 DIAGNOSIS — Z813 Family history of other psychoactive substance abuse and dependence: Secondary | ICD-10-CM

## 2020-03-13 DIAGNOSIS — J9601 Acute respiratory failure with hypoxia: Secondary | ICD-10-CM | POA: Diagnosis not present

## 2020-03-13 DIAGNOSIS — R0602 Shortness of breath: Secondary | ICD-10-CM

## 2020-03-13 DIAGNOSIS — Z681 Body mass index (BMI) 19 or less, adult: Secondary | ICD-10-CM

## 2020-03-13 DIAGNOSIS — E0865 Diabetes mellitus due to underlying condition with hyperglycemia: Secondary | ICD-10-CM | POA: Diagnosis not present

## 2020-03-13 DIAGNOSIS — S12001A Unspecified nondisplaced fracture of first cervical vertebra, initial encounter for closed fracture: Secondary | ICD-10-CM | POA: Diagnosis not present

## 2020-03-13 DIAGNOSIS — M609 Myositis, unspecified: Secondary | ICD-10-CM | POA: Diagnosis present

## 2020-03-13 DIAGNOSIS — L8990 Pressure ulcer of unspecified site, unspecified stage: Secondary | ICD-10-CM | POA: Insufficient documentation

## 2020-03-13 DIAGNOSIS — R079 Chest pain, unspecified: Secondary | ICD-10-CM | POA: Diagnosis not present

## 2020-03-13 DIAGNOSIS — S12300A Unspecified displaced fracture of fourth cervical vertebra, initial encounter for closed fracture: Secondary | ICD-10-CM | POA: Diagnosis present

## 2020-03-13 DIAGNOSIS — E875 Hyperkalemia: Secondary | ICD-10-CM | POA: Diagnosis not present

## 2020-03-13 DIAGNOSIS — Z79899 Other long term (current) drug therapy: Secondary | ICD-10-CM

## 2020-03-13 DIAGNOSIS — I2510 Atherosclerotic heart disease of native coronary artery without angina pectoris: Secondary | ICD-10-CM | POA: Diagnosis present

## 2020-03-13 DIAGNOSIS — Z794 Long term (current) use of insulin: Secondary | ICD-10-CM | POA: Diagnosis not present

## 2020-03-13 DIAGNOSIS — E1165 Type 2 diabetes mellitus with hyperglycemia: Secondary | ICD-10-CM | POA: Diagnosis not present

## 2020-03-13 DIAGNOSIS — Z8249 Family history of ischemic heart disease and other diseases of the circulatory system: Secondary | ICD-10-CM

## 2020-03-13 DIAGNOSIS — F1411 Cocaine abuse, in remission: Secondary | ICD-10-CM | POA: Diagnosis not present

## 2020-03-13 DIAGNOSIS — Z7982 Long term (current) use of aspirin: Secondary | ICD-10-CM | POA: Diagnosis not present

## 2020-03-13 DIAGNOSIS — I10 Essential (primary) hypertension: Secondary | ICD-10-CM | POA: Diagnosis present

## 2020-03-13 DIAGNOSIS — F064 Anxiety disorder due to known physiological condition: Secondary | ICD-10-CM | POA: Diagnosis present

## 2020-03-13 DIAGNOSIS — F1410 Cocaine abuse, uncomplicated: Secondary | ICD-10-CM | POA: Diagnosis present

## 2020-03-13 DIAGNOSIS — R0902 Hypoxemia: Secondary | ICD-10-CM

## 2020-03-13 DIAGNOSIS — Z9861 Coronary angioplasty status: Secondary | ICD-10-CM

## 2020-03-13 DIAGNOSIS — Z515 Encounter for palliative care: Secondary | ICD-10-CM | POA: Diagnosis not present

## 2020-03-13 DIAGNOSIS — J9801 Acute bronchospasm: Secondary | ICD-10-CM | POA: Diagnosis not present

## 2020-03-13 DIAGNOSIS — M4620 Osteomyelitis of vertebra, site unspecified: Secondary | ICD-10-CM | POA: Diagnosis not present

## 2020-03-13 DIAGNOSIS — E785 Hyperlipidemia, unspecified: Secondary | ICD-10-CM | POA: Diagnosis present

## 2020-03-13 DIAGNOSIS — Z9119 Patient's noncompliance with other medical treatment and regimen: Secondary | ICD-10-CM

## 2020-03-13 DIAGNOSIS — Z7189 Other specified counseling: Secondary | ICD-10-CM | POA: Diagnosis not present

## 2020-03-13 DIAGNOSIS — Z818 Family history of other mental and behavioral disorders: Secondary | ICD-10-CM

## 2020-03-13 DIAGNOSIS — Z20822 Contact with and (suspected) exposure to covid-19: Secondary | ICD-10-CM | POA: Diagnosis present

## 2020-03-13 DIAGNOSIS — D649 Anemia, unspecified: Secondary | ICD-10-CM | POA: Diagnosis present

## 2020-03-13 LAB — COMPREHENSIVE METABOLIC PANEL
ALT: 18 U/L (ref 0–44)
AST: 16 U/L (ref 15–41)
Albumin: 2.9 g/dL — ABNORMAL LOW (ref 3.5–5.0)
Alkaline Phosphatase: 141 U/L — ABNORMAL HIGH (ref 38–126)
Anion gap: 30 — ABNORMAL HIGH (ref 5–15)
BUN: 54 mg/dL — ABNORMAL HIGH (ref 8–23)
CO2: 8 mmol/L — ABNORMAL LOW (ref 22–32)
Calcium: 9.6 mg/dL (ref 8.9–10.3)
Chloride: 101 mmol/L (ref 98–111)
Creatinine, Ser: 1.89 mg/dL — ABNORMAL HIGH (ref 0.61–1.24)
GFR calc Af Amer: 43 mL/min — ABNORMAL LOW (ref >60)
GFR calc non Af Amer: 37 mL/min — ABNORMAL LOW (ref >60)
Glucose, Bld: 637 mg/dL (ref 70–99)
Potassium: 5.4 mmol/L — ABNORMAL HIGH (ref 3.5–5.1)
Sodium: 139 mmol/L (ref 135–145)
Total Bilirubin: 3 mg/dL — ABNORMAL HIGH (ref 0.3–1.2)
Total Protein: 7.5 g/dL (ref 6.5–8.1)

## 2020-03-13 LAB — CBC WITH DIFFERENTIAL/PLATELET
Abs Immature Granulocytes: 0.41 10*3/uL — ABNORMAL HIGH (ref 0.00–0.07)
Basophils Absolute: 0.1 10*3/uL (ref 0.0–0.1)
Basophils Relative: 0 %
Eosinophils Absolute: 0 10*3/uL (ref 0.0–0.5)
Eosinophils Relative: 0 %
HCT: 38.7 % — ABNORMAL LOW (ref 39.0–52.0)
Hemoglobin: 11.9 g/dL — ABNORMAL LOW (ref 13.0–17.0)
Immature Granulocytes: 1 %
Lymphocytes Relative: 2 %
Lymphs Abs: 0.7 10*3/uL (ref 0.7–4.0)
MCH: 31 pg (ref 26.0–34.0)
MCHC: 30.7 g/dL (ref 30.0–36.0)
MCV: 100.8 fL — ABNORMAL HIGH (ref 80.0–100.0)
Monocytes Absolute: 0.6 10*3/uL (ref 0.1–1.0)
Monocytes Relative: 2 %
Neutro Abs: 30.6 10*3/uL — ABNORMAL HIGH (ref 1.7–7.7)
Neutrophils Relative %: 95 %
Platelets: 765 10*3/uL — ABNORMAL HIGH (ref 150–400)
RBC: 3.84 MIL/uL — ABNORMAL LOW (ref 4.22–5.81)
RDW: 14.3 % (ref 11.5–15.5)
WBC: 32.4 10*3/uL — ABNORMAL HIGH (ref 4.0–10.5)
nRBC: 0 % (ref 0.0–0.2)

## 2020-03-13 LAB — BLOOD GAS, VENOUS
Acid-base deficit: 19.8 mmol/L — ABNORMAL HIGH (ref 0.0–2.0)
Bicarbonate: 6.8 mmol/L — ABNORMAL LOW (ref 20.0–28.0)
O2 Saturation: 91.8 %
Patient temperature: 37
pCO2, Ven: 19 mmHg — CL (ref 44.0–60.0)
pH, Ven: 7.16 — CL (ref 7.250–7.430)
pO2, Ven: 81 mmHg — ABNORMAL HIGH (ref 32.0–45.0)

## 2020-03-13 LAB — GLUCOSE, CAPILLARY
Glucose-Capillary: 247 mg/dL — ABNORMAL HIGH (ref 70–99)
Glucose-Capillary: 300 mg/dL — ABNORMAL HIGH (ref 70–99)
Glucose-Capillary: 322 mg/dL — ABNORMAL HIGH (ref 70–99)
Glucose-Capillary: 455 mg/dL — ABNORMAL HIGH (ref 70–99)
Glucose-Capillary: 508 mg/dL (ref 70–99)
Glucose-Capillary: 516 mg/dL (ref 70–99)
Glucose-Capillary: >600 mg/dL (ref 70–99)

## 2020-03-13 LAB — URINE DRUG SCREEN, QUALITATIVE (ARMC ONLY)
Amphetamines, Ur Screen: NOT DETECTED
Barbiturates, Ur Screen: NOT DETECTED
Benzodiazepine, Ur Scrn: NOT DETECTED
Cannabinoid 50 Ng, Ur ~~LOC~~: NOT DETECTED
Cocaine Metabolite,Ur ~~LOC~~: POSITIVE — AB
MDMA (Ecstasy)Ur Screen: NOT DETECTED
Methadone Scn, Ur: NOT DETECTED
Opiate, Ur Screen: NOT DETECTED
Phencyclidine (PCP) Ur S: NOT DETECTED
Tricyclic, Ur Screen: NOT DETECTED

## 2020-03-13 LAB — URINALYSIS, COMPLETE (UACMP) WITH MICROSCOPIC
Bacteria, UA: NONE SEEN
Bilirubin Urine: NEGATIVE
Glucose, UA: >=500 mg/dL — AB
Ketones, ur: 80 mg/dL — AB
Leukocytes,Ua: NEGATIVE
Nitrite: NEGATIVE
Protein, ur: NEGATIVE mg/dL
Specific Gravity, Urine: 1.02 (ref 1.005–1.030)
pH: 5 (ref 5.0–8.0)

## 2020-03-13 LAB — PHOSPHORUS: Phosphorus: 6.9 mg/dL — ABNORMAL HIGH (ref 2.5–4.6)

## 2020-03-13 LAB — BETA-HYDROXYBUTYRIC ACID: Beta-Hydroxybutyric Acid: >8 mmol/L — ABNORMAL HIGH (ref 0.05–0.27)

## 2020-03-13 LAB — BASIC METABOLIC PANEL
Anion gap: 22 — ABNORMAL HIGH (ref 5–15)
BUN: 49 mg/dL — ABNORMAL HIGH (ref 8–23)
CO2: 11 mmol/L — ABNORMAL LOW (ref 22–32)
Calcium: 9.3 mg/dL (ref 8.9–10.3)
Chloride: 111 mmol/L (ref 98–111)
Creatinine, Ser: 1.61 mg/dL — ABNORMAL HIGH (ref 0.61–1.24)
GFR calc Af Amer: 53 mL/min — ABNORMAL LOW (ref >60)
GFR calc non Af Amer: 45 mL/min — ABNORMAL LOW (ref >60)
Glucose, Bld: 386 mg/dL — ABNORMAL HIGH (ref 70–99)
Potassium: 4.5 mmol/L (ref 3.5–5.1)
Sodium: 144 mmol/L (ref 135–145)

## 2020-03-13 LAB — MAGNESIUM: Magnesium: 3.1 mg/dL — ABNORMAL HIGH (ref 1.7–2.4)

## 2020-03-13 LAB — RESPIRATORY PANEL BY RT PCR (FLU A&B, COVID)
Influenza A by PCR: NEGATIVE
Influenza B by PCR: NEGATIVE
SARS Coronavirus 2 by RT PCR: NEGATIVE

## 2020-03-13 LAB — LIPASE, BLOOD: Lipase: 13 U/L (ref 11–51)

## 2020-03-13 MED ORDER — DEXTROSE 50 % IV SOLN: 0.0000 mL | INTRAVENOUS | Status: DC | PRN

## 2020-03-13 MED ORDER — SODIUM CHLORIDE 0.9 % IV SOLN
INTRAVENOUS | Status: DC
Start: 2020-03-13 — End: 2020-03-13

## 2020-03-13 MED ORDER — LACTATED RINGERS IV BOLUS: 1000.0000 mL | Freq: Once | INTRAVENOUS | Status: DC

## 2020-03-13 MED ORDER — INSULIN REGULAR(HUMAN) IN NACL 100-0.9 UT/100ML-% IV SOLN
INTRAVENOUS | Status: DC
  Administered 2020-03-13 (×2): 8.5 [IU]/h via INTRAVENOUS
  Filled 2020-03-13 (×2): qty 100

## 2020-03-13 MED ORDER — ENOXAPARIN SODIUM 40 MG/0.4ML ~~LOC~~ SOLN
40.0000 mg | SUBCUTANEOUS | Status: DC
  Administered 2020-03-13 – 2020-04-15 (×34): 40 mg via SUBCUTANEOUS
  Filled 2020-03-13 (×34): qty 0.4

## 2020-03-13 MED ORDER — KCL-LACTATED RINGERS-D5W 20 MEQ/L IV SOLN
INTRAVENOUS | Status: DC
  Filled 2020-03-13 (×9): qty 1000

## 2020-03-13 MED ORDER — POTASSIUM CHLORIDE IN NACL 20-0.9 MEQ/L-% IV SOLN
INTRAVENOUS | Status: DC
  Filled 2020-03-13 (×5): qty 1000

## 2020-03-13 MED ORDER — SODIUM CHLORIDE 0.9 % IV BOLUS
1000.0000 mL | Freq: Once | INTRAVENOUS | Status: AC
  Administered 2020-03-13: 1000 mL via INTRAVENOUS

## 2020-03-13 MED ORDER — DEXTROSE-NACL 5-0.45 % IV SOLN: INTRAVENOUS | Status: DC

## 2020-03-13 MED ORDER — ASPIRIN EC 81 MG PO TBEC
81.0000 mg | DELAYED_RELEASE_TABLET | Freq: Every day | ORAL | Status: DC
  Administered 2020-03-15 – 2020-04-28 (×44): 81 mg via ORAL
  Filled 2020-03-13 (×44): qty 1

## 2020-03-13 MED ORDER — SODIUM CHLORIDE 0.9 % IV SOLN: INTRAVENOUS | Status: DC

## 2020-03-13 MED ORDER — INSULIN REGULAR(HUMAN) IN NACL 100-0.9 UT/100ML-% IV SOLN: INTRAVENOUS | Status: DC

## 2020-03-13 MED ORDER — AMLODIPINE BESYLATE 10 MG PO TABS
10.0000 mg | ORAL_TABLET | Freq: Every day | ORAL | Status: DC
  Administered 2020-03-15 – 2020-04-28 (×45): 10 mg via ORAL
  Filled 2020-03-13 (×45): qty 1

## 2020-03-13 MED ORDER — LACTATED RINGERS IV BOLUS
1000.0000 mL | Freq: Once | INTRAVENOUS | Status: AC
  Administered 2020-03-13: 1000 mL via INTRAVENOUS

## 2020-03-13 MED ORDER — FAMOTIDINE IN NACL 20-0.9 MG/50ML-% IV SOLN
20.0000 mg | INTRAVENOUS | Status: DC
  Administered 2020-03-13 – 2020-03-15 (×3): 20 mg via INTRAVENOUS
  Filled 2020-03-13 (×3): qty 50

## 2020-03-13 MED ORDER — KCL IN DEXTROSE-NACL 20-5-0.45 MEQ/L-%-% IV SOLN
INTRAVENOUS | Status: DC
  Filled 2020-03-13 (×3): qty 1000

## 2020-03-13 MED ORDER — SODIUM CHLORIDE 0.9 % IV SOLN
2.0000 g | Freq: Once | INTRAVENOUS | Status: AC
  Administered 2020-03-13: 2 g via INTRAVENOUS
  Filled 2020-03-13: qty 2

## 2020-03-13 MED ORDER — SODIUM CHLORIDE 0.9 % IV BOLUS
1000.0000 mL | INTRAVENOUS | Status: AC
  Administered 2020-03-13 (×2): 1000 mL via INTRAVENOUS

## 2020-03-13 NOTE — ED Notes (Signed)
Dr. Lenard Lance aware of ph of 7.16.

## 2020-03-13 NOTE — ED Notes (Signed)
Dr. Lenard Lance aware of glucose of 637.

## 2020-03-13 NOTE — ED Notes (Signed)
Attempted to call report to ICU. RN not ready.

## 2020-03-13 NOTE — ED Notes (Signed)
Attempted to call report to ICU. Charge RN will return call.

## 2020-03-13 NOTE — ED Notes (Signed)
Patient is resting on stretcher. Urinal at bedside. Blanket given to patient. Patient denies discomfort at this time. Patient declined to be repositioned on stretcher.

## 2020-03-13 NOTE — H&P (Addendum)
History and Physical    Spencer [name] ZSW:[id number] DOB: [birth year] DOA: 03/13/2020  PCP: Patient, No Pcp Per   Patient coming from: Home  I have personally briefly reviewed patient's old medical records in [facility]  Chief Complaint: Confusion, weakness  HPI: Spencer [name] is a 59 y.o. male with medical history significant for type 2 diabetes mellitus, cocaine abuse, brain aneurysm, hypertension, GERD.  Patient was recently admitted on 03/03/20 for DKA, acute renal failure and patient signed out against medical advice on 03/10/2020.  Patient brought in by EMS.  Patient is not sure why he is here.  As per him his sister called EMS.  Patient states his blood glucose levels were very high.  When asked how compliant is he with his insulin he told me that he is taking his insulin but he does have a history of noncompliance.  He denies any chest pain, shortness of breath.  Denies any abdominal pain, nausea or any vomitings.  No fevers or chills.  Denies any diarrhea.  Does complain of constipation.  Does state that he has not been able to urinate much today.  ED Course: Evaluation in the ER showed that the patient was in DKA.  His blood glucose levels were high, anion gap was 30.  Patient was hydrated on insulin drip in the ER, given IV fluid bolus of 1 L.  WBC count was also significantly elevated.  Chest x-ray, urinalysis were pending.  Review of Systems: Ten point review of systems reviewed  in detail and negative except as mentioned above in the HPI.   Past Medical History:  Diagnosis Date  . Brain aneurysm   . Broken bones    clavical, ankle, arms toes wriast   . Diabetes mellitus without complication (HCC)   . Dysrhythmia   . GERD (gastroesophageal reflux disease)   . Hypertension   . Substance abuse [facility])     Past Surgical History:  Procedure Laterality Date  . CORONARY/GRAFT ACUTE MI REVASCULARIZATION N/A 09/04/2019   Procedure: Coronary/Graft Acute MI Revascularization;   Surgeon: Alwyn Pea, MD;  Location: [facility] INVASIVE CV LAB;  Service: Cardiovascular;  Laterality: N/A;  . ESOPHAGOGASTRODUODENOSCOPY (EGD) WITH PROPOFOL N/A 06/04/2019   Procedure: ESOPHAGOGASTRODUODENOSCOPY (EGD) WITH PROPOFOL;  Surgeon: Toledo, Boykin Nearing, MD;  Location: [facility] ENDOSCOPY;  Service: Gastroenterology;  Laterality: N/A;  . HERNIA REPAIR    . IMPLANTATION / PLACEMENT OF STRIP ELECTRODES VIA BURR HOLES SUBDURAL     anyrusum  . LEFT HEART CATH AND CORONARY ANGIOGRAPHY N/A 09/04/2019   Procedure: LEFT HEART CATH AND CORONARY ANGIOGRAPHY;  Surgeon: Alwyn Pea, MD;  Location: [facility] INVASIVE CV LAB;  Service: Cardiovascular;  Laterality: N/A;  . NECK SURGERY    . TEE WITHOUT CARDIOVERSION N/A 06/03/2019   Procedure: TRANSESOPHAGEAL ECHOCARDIOGRAM (TEE);  Surgeon: Dalia Heading, MD;  Location: [facility] ORS;  Service: Cardiovascular;  Laterality: N/A;    Social History  reports that he has been smoking. He has never used smokeless tobacco. He reports current alcohol use of about 1.0 standard drinks of alcohol per week. He reports previous drug use. Drug: Cocaine.  No Known Allergies  Family History  Problem Relation Age of Onset  . CAD Sister   . Hypertension Sister   . Healthy Mother   . Healthy Father       Prior to Admission medications   Medication Sig Start Date End Date Taking? Authorizing Provider  amLODipine (NORVASC) 10 MG tablet Take 1 tablet (10  mg total) by mouth daily. 02/18/20  Yes Pennie Banter, DO  aspirin EC 81 MG tablet Take 1 tablet (81 mg total) by mouth daily. 10/26/19 05/13/20 Yes Sreenath, Sudheer B, MD  atorvastatin (LIPITOR) 40 MG tablet Take 1 tablet (40 mg total) by mouth daily at 6 PM. 02/17/20  Yes Esaw Grandchild A, DO  insulin aspart (NOVOLOG) 100 UNIT/ML FlexPen Inject 12 Units into the skin 3 (three) times daily with meals. 02/17/20  Yes Esaw Grandchild A, DO  insulin glargine (LANTUS) 100 unit/mL SOPN Inject 0.18 mLs (18 Units total)  into the skin daily. 02/17/20  Yes Esaw Grandchild A, DO  losartan (COZAAR) 50 MG tablet Take 1 tablet (50 mg total) by mouth daily. 02/18/20  Yes Esaw Grandchild A, DO  pantoprazole (PROTONIX) 40 MG tablet Take 1 tablet (40 mg total) by mouth daily. 02/18/20  Yes Pennie Banter, DO    Physical Exam: Vitals:   03/13/20 1647 03/13/20 1651 03/13/20 1654  BP:  116/80   Pulse:  (!) 127   Resp:  18   Temp:  (!) 97.5 F (36.4 C)   TempSrc:  Oral   SpO2: 95% 100%   Weight:   59 kg  Height:   5\' 10"  (1.778 m)    Constitutional: Patient lying in the bed in no acute distress, he is calm and comfortable Vitals:   03/13/20 1647 03/13/20 1651 03/13/20 1654  BP:  116/80   Pulse:  (!) 127   Resp:  18   Temp:  (!) 97.5 F (36.4 C)   TempSrc:  Oral   SpO2: 95% 100%   Weight:   59 kg  Height:   5\' 10"  (1.778 m)   Vital Signs Reviewed: (Listed separately) GENERAL:  Patient is lying in the bed in no acute distress.  EYES:  No pallor.  No icterus.  Pupils are equally reactive to light bilaterally. HENT: Normocephalic, atraumatic. Mucous membranes are dry  NECK:  No JVD.  Neck is supple.  No lymphadenopathy. CARDIOVASCULAR:  S1, S2 heard. Rate and rhythm regular, tachycardic.   RESPIRATORY:  Bilateral air entry was present. Clear to auscultation. ABDOMEN:  Soft and nontender. normoactive bowel sounds GENITOURINARY:  Deferred EXTREMITIES:  Warm with brisk capillary refill, no edema , no cyanosis. SKIN: warm and dry.  No rashes noted on limited skin examination. NEUROLOGIC: Patient is alert awake and oriented x1, no gross focal neurologic deficits appreciated.  He is able to move all his extremities PSYCHIATRIC:  Calm and cooperative     Labs on Admission: I have personally reviewed following labs and imaging studies  CBC: Recent Labs  Lab 03/13/20 1659  WBC 32.4*  NEUTROABS 30.6*  HGB 11.9*  HCT 38.7*  MCV 100.8*  PLT 765*    Basic Metabolic Panel: Recent Labs  Lab  03/07/20 0428 03/08/20 0437 03/09/20 0854 03/13/20 1659  NA 130* 132* 131* 139  K 3.4* 3.7 3.6 5.4*  CL 97* 100 98 101  CO2 26 26 25  8*  GLUCOSE 208* 195* 311* 637*  BUN 13 15 15  54*  CREATININE 0.72 0.61 0.84 1.89*  CALCIUM 8.2* 8.4* 8.4* 9.6  MG 2.0  --   --   --     GFR: Estimated Creatinine Clearance: 34.3 mL/min (A) (by C-G formula based on SCr of 1.89 mg/dL (H)).  Liver Function Tests: Recent Labs  Lab 03/13/20 1659  AST 16  ALT 18  ALKPHOS 141*  BILITOT 3.0*  PROT 7.5  ALBUMIN  2.9*    Urine analysis:    Component Value Date/Time   COLORURINE YELLOW (A) 03/03/2020 1249   APPEARANCEUR CLEAR (A) 03/03/2020 1249   LABSPEC 1.026 03/03/2020 1249   PHURINE 5.0 03/03/2020 1249   GLUCOSEU >=500 (A) 03/03/2020 1249   HGBUR NEGATIVE 03/03/2020 1249   BILIRUBINUR NEGATIVE 03/03/2020 1249   KETONESUR 80 (A) 03/03/2020 1249   PROTEINUR 30 (A) 03/03/2020 1249   NITRITE NEGATIVE 03/03/2020 1249   LEUKOCYTESUR NEGATIVE 03/03/2020 1249    Radiological Exams on Admission: No results found.  EKG: Independently reviewed.  Shows sinus tachycardia at 103 bpm, no acute ST elevations or ST depressions as read by me.  Assessment/Plan Active Problems:   * No active hospital problems. *   Acute diabetic ketoacidosis in a patient with type 2 diabetes mellitus, POA Acute metabolic acidosis secondary to DKA, POA -We will admit the patient to the stepdown unit -Anion gap of 30, serum bicarb level of 8 on admission -Patient was given 1 L of LR in the ED ER.  Was given another 2 L of normal saline bolus then placement 150 cc of normal saline per hour. -Was given insulin bolus, initiated on insulin drip in the ER as per the DKA protocol -Continue DKA protocol -N.p.o. for now -BMP every 4 hours -Potassium levels are slightly elevated.  Continue to monitor and replace potassium once the levels trend down.  Also check mag and Phos levels. -Once blood glucose levels of less than 250  then will switch IV fluids to dextrose containing fluids. -Patient's last echocardiogram in April 2021 showed EF of 60 to 65% with grade 1 diastolic dysfunction.  Monitor for any volume overload and adjust his fluids as needed.  Acute kidney injury secondary to dehydration, POA Acute dehydration, POA -Avoid nephrotoxic agents -Monitor input and output -IV fluid hydration aggressively  Mild hyperkalemia, POA -Continue to monitor the potassium levels with IV fluid hydration  Leukocytosis, POA -Likely reactive from the DKA -Chest x-ray was reviewed by me, shows no evidence of any infiltrates -Urinalysis does not show any signs of infection.  Will give a dose of Maxipime and reevaluate again in a.m for further antibitoics. -Blood cultures have been sent out -On reviewing prior notes patient had MSSA bacteremia on his last admission and signed out against medical advice before completing his antibiotic course.  Hypertension, benign essential -Continue amlodipine -Hold losartan due to acute renal failure  Metabolic encephalopathy secondary to DKA, POA -Rule out any source of infection -Continue to monitor the mental status  Chronic bilirubin elevation -Continue to monitor  GERD -We will place him on Pepcid   DVT prophylaxis: Subcu Lovenox Code Status: Full code Family Communication: No family available at the bedside Disposition Plan: Home with likely home health services Consults called: None Admission status: Inpatient, stepdown unit  Severity of Illness: The appropriate patient status for this patient is INPATIENT. Inpatient status is judged to be reasonable and necessary in order to provide the required intensity of service to ensure the patient's safety. The patient's presenting symptoms, physical exam findings, and initial radiographic and laboratory data in the context of their chronic comorbidities is felt to place them at high risk for further clinical deterioration.  Furthermore, it is not anticipated that the patient will be medically stable for discharge from the hospital within 2 midnights of admission. The following factors support the patient status of inpatient.   " The patient's presenting symptoms include very high blood glucose levels, confusion, weakness. "  The worrisome physical exam findings include tachycardia, patient is very dry " The initial radiographic and laboratory data are worrisome because of anion gap metabolic acidosis, hyperglycemia, renal failure. " The chronic co-morbidities include labs mellitus type II, hypertension.   * I certify that at the point of admission it is my clinical judgment that the patient will require inpatient hospital care spanning beyond 2 midnights from the point of admission due to high intensity of service, high risk for further deterioration and high frequency of surveillance required.Arelia Sneddon MD [facility]  How to contact the [facility] Attending or Consulting provider 7A - 7P or covering provider during after hours 7P -7A, for this patient?   1. Check the care team in [facility] and look for a) attending/consulting [facility] provider listed and b) the [facility] team listed 2. Log into [email or web address] and use [place]'s universal password to access. If you do not have the password, please contact the hospital operator. 3. Locate the [facility] provider you are looking for under [facility] and page to a number that you can be directly reached. 4. If you still have difficulty reaching the provider, please page the [facility] (Director on Call) for the Hospitalists listed on amion for assistance.  03/13/2020, 6:45 PM

## 2020-03-13 NOTE — ED Triage Notes (Signed)
Per EMS report, they were called out to a boarding house where the patient was staying with a friend. Patient has a history of diabetes and had a blood glucose of 503.  Patient states he takes Metformin and injectable insulin, but hasn't taken it recently. Patient was incontinent to urine upon arrival.

## 2020-03-13 NOTE — ED Provider Notes (Signed)
[facility] Emergency Department Provider Note  Time seen: 4:56 PM  I have reviewed the triage vital signs and the nursing notes.   HISTORY  Chief Complaint Hyperglycemia   HPI Spencer [name] is a 59 y.o. male with a past medical history of diabetes, gastric reflux, hypertension, substance abuse, presents to the emergency department for hypoglycemia.  According to the patient he did not call for EMS, states he does not know who called for EMS.  Patient states his blood sugar was high, denies taking his insulin today.  Patient denies any nausea vomiting chest pain or abdominal pain.  Denies any alcohol use or drug use.  Patient is somewhat confused, cannot tell me the name of the hospital or the year but said it was 2222.   Past Medical History:  Diagnosis Date  . Brain aneurysm   . Broken bones    clavical, ankle, arms toes wriast   . Diabetes mellitus without complication ([place])   . Dysrhythmia   . GERD (gastroesophageal reflux disease)   . Hypertension   . Substance abuse [facility])     Patient Active Problem List   Diagnosis Date Noted  . Pressure injury of skin 03/06/2020  . Staphylococcus aureus bacteremia with sepsis ([place]) 03/05/2020  . C4 cervical fracture ([place]) 03/05/2020  . Fever 03/04/2020  . Fall at home 03/03/2020  . Respiratory failure ([place]) 02/13/2020  . Weakness   . Lobar pneumonia ([place])   . Hyperglycemia   . Tobacco abuse counseling   . Atrial fibrillation with RVR ([place]) 08/07/2019  . GERD (gastroesophageal reflux disease) 08/07/2019  . Acute renal failure ([place])   . Hyperkalemia   . Sepsis ([place]) 06/09/2019  . AKI (acute kidney injury) ([place]) 02/19/2019  . Hypoglycemia 10/23/2018  . Chronic low back pain 01/16/2017  . Chronic neck pain 01/16/2017  . Diabetic neuropathy ([place]) 01/16/2017  . DM2 (diabetes mellitus, type 2) ([place]) 01/16/2017  . History of cocaine abuse ([place]) 01/16/2017  . Personal history of subdural hematoma 01/16/2017  .  Closed displaced fracture of body of left calcaneus with delayed healing 12/13/2016  . Protein-calorie malnutrition, severe 11/08/2016  . DKA, type 2 ([place]) 11/06/2016  . DKA (diabetic ketoacidoses) ([place]) 04/03/2015  . Hypertension 04/03/2015  . Depression 04/03/2015  . Opiate dependence ([place]) 04/03/2015  . Tobacco abuse 04/03/2015  . Lumbosacral neuritis 07/19/2014  . Pain of finger of right hand 07/19/2014  . Type II or unspecified type diabetes mellitus without mention of complication, not stated as uncontrolled 07/19/2014  . Knee pain 09/12/2013  . Hernia of flank 09/20/2012  . Epidermoid cyst of skin 09/20/2012  . Chronic pain of both shoulders 03/27/2012    Past Surgical History:  Procedure Laterality Date  . CORONARY/GRAFT ACUTE MI REVASCULARIZATION N/A 09/04/2019   Procedure: Coronary/Graft Acute MI Revascularization;  Surgeon: Yolonda Kida, MD;  Location: [place] CV LAB;  Service: Cardiovascular;  Laterality: N/A;  . ESOPHAGOGASTRODUODENOSCOPY (EGD) WITH PROPOFOL N/A 06/04/2019   Procedure: ESOPHAGOGASTRODUODENOSCOPY (EGD) WITH PROPOFOL;  Surgeon: Toledo, Benay Pike, MD;  Location: [facility] ENDOSCOPY;  Service: Gastroenterology;  Laterality: N/A;  . HERNIA REPAIR    . IMPLANTATION / PLACEMENT OF STRIP ELECTRODES VIA BURR HOLES SUBDURAL     anyrusum  . LEFT HEART CATH AND CORONARY ANGIOGRAPHY N/A 09/04/2019   Procedure: LEFT HEART CATH AND CORONARY ANGIOGRAPHY;  Surgeon: Yolonda Kida, MD;  Location: [place] CV LAB;  Service: Cardiovascular;  Laterality: N/A;  . NECK SURGERY    .  TEE WITHOUT CARDIOVERSION N/A 06/03/2019   Procedure: TRANSESOPHAGEAL ECHOCARDIOGRAM (TEE);  Surgeon: Dalia Heading, MD;  Location: [facility] ORS;  Service: Cardiovascular;  Laterality: N/A;    Prior to Admission medications   Medication Sig Start Date End Date Taking? Authorizing Provider  amLODipine (NORVASC) 10 MG tablet Take 1 tablet (10 mg total) by mouth daily. 02/18/20   Pennie Banter, DO  aspirin EC 81 MG tablet Take 1 tablet (81 mg total) by mouth daily. 10/26/19 05/13/20  Tresa Moore, MD  atorvastatin (LIPITOR) 40 MG tablet Take 1 tablet (40 mg total) by mouth daily at 6 PM. 02/17/20   Esaw Grandchild A, DO  insulin aspart (NOVOLOG) 100 UNIT/ML FlexPen Inject 12 Units into the skin 3 (three) times daily with meals. 02/17/20   Esaw Grandchild A, DO  insulin glargine (LANTUS) 100 unit/mL SOPN Inject 0.18 mLs (18 Units total) into the skin daily. 02/17/20   Pennie Banter, DO  losartan (COZAAR) 50 MG tablet Take 1 tablet (50 mg total) by mouth daily. 02/18/20   Pennie Banter, DO  pantoprazole (PROTONIX) 40 MG tablet Take 1 tablet (40 mg total) by mouth daily. 02/18/20   Pennie Banter, DO    No Known Allergies  Family History  Problem Relation Age of Onset  . CAD Sister   . Hypertension Sister   . Healthy Mother   . Healthy Father     Social History Social History   Tobacco Use  . Smoking status: Current Some Day Smoker  . Smokeless tobacco: Never Used  Substance Use Topics  . Alcohol use: Yes    Alcohol/week: 1.0 standard drinks    Types: 1 Cans of beer per week  . Drug use: Not Currently    Types: Cocaine    Review of Systems Unable to obtain adequate/accurate review of systems secondary to altered mental status/confusion.  ____________________________________________   PHYSICAL EXAM:  VITAL SIGNS: ED Triage Vitals  Enc Vitals Group     BP 03/13/20 1651 116/80     Pulse Rate 03/13/20 1651 (!) 127     Resp 03/13/20 1651 18     Temp 03/13/20 1651 (!) 97.5 F (36.4 C)     Temp Source 03/13/20 1651 Oral     SpO2 03/13/20 1647 95 %     Weight 03/13/20 1654 130 lb (59 kg)     Height 03/13/20 1654 5\' 10"  (1.778 m)     Head Circumference --      Peak Flow --      Pain Score 03/13/20 1654 0     Pain Loc --      Pain Edu? --      Excl. in GC? --     Constitutional: Patient is awake and alert but oriented to person only, was  able to tell me he is in a hospital but could not tell me which one.  Thought the year was 2222. Eyes: Normal exam Head: Atraumatic. ENT      Mouth/Throat: Dry mucous membranes. Cardiovascular: Normal rate, regular rhythm. No murmurs, rubs, or gallops. Respiratory: Normal respiratory effort without tachypnea nor retractions. Breath sounds are clear Gastrointestinal: Soft and nontender. No distention. Musculoskeletal: Nontender with normal range of motion in all extremities. Neurologic:  Normal speech and language. No gross focal neurologic deficits  Skin:  Skin is warm, dry and intact.  Psychiatric: Mood and affect are normal.  ____________________________________________    EKG  EKG viewed and interpreted by myself shows  sinus tachycardia 103 bpm with a narrow QRS, normal axis, normal intervals, nonspecific ST changes.     INITIAL IMPRESSION / ASSESSMENT AND PLAN / ED COURSE  Pertinent labs & imaging results that were available during my care of the patient were reviewed by me and considered in my medical decision making (see chart for details).   Patient presents to the emergency department for confusion found to be hyperglycemic greater than 600 on CBG.  I have reviewed the patient's records, it appears that the patient was recently admitted 03/03/2020 for DKA.  Differential would include HHS, DKA, encephalopathy, substance use, dehydration.  We will check labs, start IV hydrating the patient we will obtain a VBG and continue to closely monitor.  Patient agreeable to plan of care.  Patient's labs are significant for significant hyperglycemia, renal insufficiency anion gap of 30 white blood cell count of 30.  Patient appears to be in fairly significant DKA.  Urine and VBG are pending.  We will start the patient on insulin infusion.  Patient will be admitted to the hospital service.  ODA LANSDOWNE was evaluated in Emergency Department on 03/13/2020 for the symptoms described in the  history of present illness. He was evaluated in the context of the global COVID-19 pandemic, which necessitated consideration that the patient might be at risk for infection with the SARS-CoV-2 virus that causes COVID-19. Institutional protocols and algorithms that pertain to the evaluation of patients at risk for COVID-19 are in a state of rapid change based on information released by regulatory bodies including the CDC and federal and state organizations. These policies and algorithms were followed during the patient's care in the ED.  CRITICAL CARE Performed by: Minna Antis   Total critical care time: 30 minutes  Critical care time was exclusive of separately billable procedures and treating other patients.  Critical care was necessary to treat or prevent imminent or life-threatening deterioration.  Critical care was time spent personally by me on the following activities: development of treatment plan with patient and/or surrogate as well as nursing, discussions with consultants, evaluation of patient's response to treatment, examination of patient, obtaining history from patient or surrogate, ordering and performing treatments and interventions, ordering and review of laboratory studies, ordering and review of radiographic studies, pulse oximetry and re-evaluation of patient's condition.   ____________________________________________   FINAL CLINICAL IMPRESSION(S) / ED DIAGNOSES  Hyperglycemia Diabetic ketoacidosis   Minna Antis, MD 03/13/20 [phone]

## 2020-03-14 ENCOUNTER — Inpatient Hospital Stay
Admit: 2020-03-14 | Discharge: 2020-03-14 | Disposition: A | Discharge status: Home / Self Care | Payer: Medicaid Other | Attending: Internal Medicine | Admitting: Internal Medicine

## 2020-03-14 DIAGNOSIS — R7881 Bacteremia: Secondary | ICD-10-CM | POA: Diagnosis present

## 2020-03-14 DIAGNOSIS — F17210 Nicotine dependence, cigarettes, uncomplicated: Secondary | ICD-10-CM | POA: Diagnosis present

## 2020-03-14 DIAGNOSIS — L8990 Pressure ulcer of unspecified site, unspecified stage: Secondary | ICD-10-CM | POA: Insufficient documentation

## 2020-03-14 LAB — ECHOCARDIOGRAM COMPLETE
Height: 70 in
Weight: 2127 oz

## 2020-03-14 LAB — BLOOD CULTURE ID PANEL (REFLEXED)

## 2020-03-14 LAB — GLUCOSE, CAPILLARY
Glucose-Capillary: 135 mg/dL — ABNORMAL HIGH (ref 70–99)
Glucose-Capillary: 145 mg/dL — ABNORMAL HIGH (ref 70–99)
Glucose-Capillary: 170 mg/dL — ABNORMAL HIGH (ref 70–99)
Glucose-Capillary: 172 mg/dL — ABNORMAL HIGH (ref 70–99)
Glucose-Capillary: 175 mg/dL — ABNORMAL HIGH (ref 70–99)
Glucose-Capillary: 176 mg/dL — ABNORMAL HIGH (ref 70–99)
Glucose-Capillary: 189 mg/dL — ABNORMAL HIGH (ref 70–99)
Glucose-Capillary: 191 mg/dL — ABNORMAL HIGH (ref 70–99)
Glucose-Capillary: 194 mg/dL — ABNORMAL HIGH (ref 70–99)
Glucose-Capillary: 202 mg/dL — ABNORMAL HIGH (ref 70–99)
Glucose-Capillary: 223 mg/dL — ABNORMAL HIGH (ref 70–99)
Glucose-Capillary: 277 mg/dL — ABNORMAL HIGH (ref 70–99)
Glucose-Capillary: 336 mg/dL — ABNORMAL HIGH (ref 70–99)

## 2020-03-14 LAB — BASIC METABOLIC PANEL
Anion gap: 10 (ref 5–15)
Anion gap: 7 (ref 5–15)
Anion gap: 8 (ref 5–15)
BUN: 42 mg/dL — ABNORMAL HIGH (ref 8–23)
BUN: 41 mg/dL — ABNORMAL HIGH (ref 8–23)
BUN: 34 mg/dL — ABNORMAL HIGH (ref 8–23)
CO2: 17 mmol/L — ABNORMAL LOW (ref 22–32)
CO2: 21 mmol/L — ABNORMAL LOW (ref 22–32)
CO2: 20 mmol/L — ABNORMAL LOW (ref 22–32)
Calcium: 8.7 mg/dL — ABNORMAL LOW (ref 8.9–10.3)
Calcium: 8.8 mg/dL — ABNORMAL LOW (ref 8.9–10.3)
Calcium: 8.8 mg/dL — ABNORMAL LOW (ref 8.9–10.3)
Chloride: 118 mmol/L — ABNORMAL HIGH (ref 98–111)
Chloride: 119 mmol/L — ABNORMAL HIGH (ref 98–111)
Chloride: 118 mmol/L — ABNORMAL HIGH (ref 98–111)
Creatinine, Ser: 0.98 mg/dL (ref 0.61–1.24)
Creatinine, Ser: 0.84 mg/dL (ref 0.61–1.24)
Creatinine, Ser: 0.93 mg/dL (ref 0.61–1.24)
GFR calc Af Amer: >60 mL/min (ref >60)
GFR calc Af Amer: >60 mL/min (ref >60)
GFR calc Af Amer: >60 mL/min (ref >60)
GFR calc non Af Amer: >60 mL/min (ref >60)
GFR calc non Af Amer: >60 mL/min (ref >60)
GFR calc non Af Amer: >60 mL/min (ref >60)
Glucose, Bld: 165 mg/dL — ABNORMAL HIGH (ref 70–99)
Glucose, Bld: 176 mg/dL — ABNORMAL HIGH (ref 70–99)
Glucose, Bld: 187 mg/dL — ABNORMAL HIGH (ref 70–99)
Potassium: 4.7 mmol/L (ref 3.5–5.1)
Potassium: 4.1 mmol/L (ref 3.5–5.1)
Potassium: 3.9 mmol/L (ref 3.5–5.1)
Sodium: 145 mmol/L (ref 135–145)
Sodium: 147 mmol/L — ABNORMAL HIGH (ref 135–145)
Sodium: 146 mmol/L — ABNORMAL HIGH (ref 135–145)

## 2020-03-14 LAB — BETA-HYDROXYBUTYRIC ACID
Beta-Hydroxybutyric Acid: 4.26 mmol/L — ABNORMAL HIGH (ref 0.05–0.27)
Beta-Hydroxybutyric Acid: 1.02 mmol/L — ABNORMAL HIGH (ref 0.05–0.27)

## 2020-03-14 MED ORDER — INSULIN ASPART 100 UNIT/ML ~~LOC~~ SOLN
0.0000 [IU] | Freq: Three times a day (TID) | SUBCUTANEOUS | Status: DC
  Administered 2020-03-14: 2 [IU] via SUBCUTANEOUS
  Administered 2020-03-14: 3 [IU] via SUBCUTANEOUS
  Administered 2020-03-15 (×2): 15 [IU] via SUBCUTANEOUS
  Administered 2020-03-15: 8 [IU] via SUBCUTANEOUS
  Administered 2020-03-16 (×3): 15 [IU] via SUBCUTANEOUS
  Administered 2020-03-17: 3 [IU] via SUBCUTANEOUS
  Administered 2020-03-17: 11 [IU] via SUBCUTANEOUS
  Administered 2020-03-17: 3 [IU] via SUBCUTANEOUS
  Administered 2020-03-18: 2 [IU] via SUBCUTANEOUS
  Administered 2020-03-18: 11 [IU] via SUBCUTANEOUS
  Administered 2020-03-18: 8 [IU] via SUBCUTANEOUS
  Administered 2020-03-19: 3 [IU] via SUBCUTANEOUS
  Administered 2020-03-19: 15 [IU] via SUBCUTANEOUS
  Administered 2020-03-20: 3 [IU] via SUBCUTANEOUS
  Administered 2020-03-20: 4 [IU] via SUBCUTANEOUS
  Administered 2020-03-20: 15 [IU] via SUBCUTANEOUS
  Administered 2020-03-21 (×2): 11 [IU] via SUBCUTANEOUS
  Administered 2020-03-22: 15 [IU] via SUBCUTANEOUS
  Administered 2020-03-22: 8 [IU] via SUBCUTANEOUS
  Administered 2020-03-22: 15 [IU] via SUBCUTANEOUS
  Administered 2020-03-23 (×3): 3 [IU] via SUBCUTANEOUS
  Administered 2020-03-24: 15 [IU] via SUBCUTANEOUS
  Administered 2020-03-24: 8 [IU] via SUBCUTANEOUS
  Administered 2020-03-24: 5 [IU] via SUBCUTANEOUS
  Administered 2020-03-25 (×2): 3 [IU] via SUBCUTANEOUS
  Administered 2020-03-26: 5 [IU] via SUBCUTANEOUS
  Administered 2020-03-26: 8 [IU] via SUBCUTANEOUS
  Administered 2020-03-27: 5 [IU] via SUBCUTANEOUS
  Administered 2020-03-27 – 2020-03-28 (×2): 2 [IU] via SUBCUTANEOUS
  Administered 2020-03-28 – 2020-03-29 (×3): 5 [IU] via SUBCUTANEOUS
  Administered 2020-03-29 – 2020-03-30 (×4): 2 [IU] via SUBCUTANEOUS
  Administered 2020-03-31: 11 [IU] via SUBCUTANEOUS
  Administered 2020-04-01: 2 [IU] via SUBCUTANEOUS
  Administered 2020-04-01: 3 [IU] via SUBCUTANEOUS
  Administered 2020-04-02 – 2020-04-03 (×3): 5 [IU] via SUBCUTANEOUS
  Administered 2020-04-03: 3 [IU] via SUBCUTANEOUS
  Administered 2020-04-04: 15 [IU] via SUBCUTANEOUS
  Administered 2020-04-04: 11 [IU] via SUBCUTANEOUS
  Administered 2020-04-05: 5 [IU] via SUBCUTANEOUS
  Administered 2020-04-06: 11 [IU] via SUBCUTANEOUS
  Administered 2020-04-06: 8 [IU] via SUBCUTANEOUS
  Administered 2020-04-07: 15 [IU] via SUBCUTANEOUS
  Administered 2020-04-07: 5 [IU] via SUBCUTANEOUS
  Filled 2020-03-14 (×59): qty 1

## 2020-03-14 MED ORDER — SODIUM CHLORIDE 0.9 % IV SOLN
INTRAVENOUS | Status: DC | PRN
  Administered 2020-03-14 – 2020-03-24 (×4): 250 mL via INTRAVENOUS
  Administered 2020-03-27 (×2): 500 mL via INTRAVENOUS

## 2020-03-14 MED ORDER — CEFAZOLIN SODIUM-DEXTROSE 1-4 GM/50ML-% IV SOLN
1.0000 g | Freq: Three times a day (TID) | INTRAVENOUS | Status: DC
  Filled 2020-03-14 (×3): qty 50

## 2020-03-14 MED ORDER — LOSARTAN POTASSIUM 50 MG PO TABS
50.0000 mg | ORAL_TABLET | Freq: Every day | ORAL | Status: DC
  Administered 2020-03-14 – 2020-04-10 (×28): 50 mg via ORAL
  Filled 2020-03-14 (×30): qty 1

## 2020-03-14 MED ORDER — INSULIN DETEMIR 100 UNIT/ML ~~LOC~~ SOLN
0.3000 [IU]/kg | SUBCUTANEOUS | Status: DC
  Administered 2020-03-14 – 2020-03-15 (×2): 18 [IU] via SUBCUTANEOUS
  Filled 2020-03-14 (×2): qty 0.18

## 2020-03-14 MED ORDER — INSULIN ASPART 100 UNIT/ML ~~LOC~~ SOLN
0.0000 [IU] | Freq: Every day | SUBCUTANEOUS | Status: DC
  Administered 2020-03-16: 2 [IU] via SUBCUTANEOUS
  Administered 2020-03-24: 3 [IU] via SUBCUTANEOUS
  Administered 2020-03-25: 2 [IU] via SUBCUTANEOUS
  Administered 2020-03-27: 3 [IU] via SUBCUTANEOUS
  Administered 2020-04-01: 5 [IU] via SUBCUTANEOUS
  Administered 2020-04-02: 4 [IU] via SUBCUTANEOUS
  Administered 2020-04-03: 5 [IU] via SUBCUTANEOUS
  Administered 2020-04-04 – 2020-04-05 (×2): 2 [IU] via SUBCUTANEOUS
  Administered 2020-04-06: 5 [IU] via SUBCUTANEOUS
  Administered 2020-04-08 – 2020-04-09 (×2): 3 [IU] via SUBCUTANEOUS
  Administered 2020-04-11: 2 [IU] via SUBCUTANEOUS
  Administered 2020-04-13 – 2020-04-14 (×2): 5 [IU] via SUBCUTANEOUS
  Administered 2020-04-16: 3 [IU] via SUBCUTANEOUS
  Administered 2020-04-18 – 2020-04-19 (×2): 2 [IU] via SUBCUTANEOUS
  Administered 2020-04-22: 5 [IU] via SUBCUTANEOUS
  Administered 2020-04-24: 4 [IU] via SUBCUTANEOUS
  Administered 2020-04-25: 2 [IU] via SUBCUTANEOUS
  Administered 2020-04-27: 4 [IU] via SUBCUTANEOUS
  Filled 2020-03-14 (×22): qty 1

## 2020-03-14 MED ORDER — CHLORHEXIDINE GLUCONATE CLOTH 2 % EX PADS
6.0000 | MEDICATED_PAD | Freq: Every day | CUTANEOUS | Status: DC
  Administered 2020-03-14 – 2020-03-21 (×2): 6 via TOPICAL

## 2020-03-14 MED ORDER — INSULIN ASPART 100 UNIT/ML ~~LOC~~ SOLN
3.0000 [IU] | Freq: Three times a day (TID) | SUBCUTANEOUS | Status: DC
  Administered 2020-03-14 – 2020-03-15 (×2): 3 [IU] via SUBCUTANEOUS
  Filled 2020-03-14: qty 1

## 2020-03-14 MED ORDER — CEFAZOLIN SODIUM-DEXTROSE 2-4 GM/100ML-% IV SOLN
2.0000 g | Freq: Three times a day (TID) | INTRAVENOUS | Status: DC
  Administered 2020-03-14 – 2020-03-17 (×10): 2 g via INTRAVENOUS
  Filled 2020-03-14 (×12): qty 100

## 2020-03-14 NOTE — Progress Notes (Signed)
*  PRELIMINARY RESULTS* Echocardiogram 2D Echocardiogram has been performed.  Spencer [name] C [name] 03/14/2020, 12:30 PM

## 2020-03-14 NOTE — Progress Notes (Signed)
Patient ID: Spencer [name], male   DOB: 08/26/1961, 59 y.o.   MRN: [id number]         [facility] ([place] for Infectious Disease  Date of Admission:  03/13/2020           Day 2 cefazolin ASSESSMENT: Not surprisingly, his MSSA bacteremia relapsed rapidly after he left the hospital AGAINST MEDICAL ADVICE recently.  Order repeat blood cultures for tomorrow morning.  He was cleared for TEE by his C4 fracture when he was previously admitted.  PLAN: 1. Continue cefazolin 2. Repeat blood cultures in a.m. 3. Recommend TEE 4. The Ravisankar of his readmission  Principal Problem:   MSSA bacteremia Active Problems:   DKA (diabetic ketoacidoses) (HCC)   Hypertension   Diabetic neuropathy (HCC)   DM2 (diabetes mellitus, type 2) (HCC)   History of cocaine abuse (HCC)   Personal history of subdural hematoma   AKI (acute kidney injury) (HCC)   GERD (gastroesophageal reflux disease)   C4 cervical fracture (HCC)   Cigarette smoker   Scheduled Meds: . amLODipine  10 mg Oral Daily  . aspirin EC  81 mg Oral Daily  . Chlorhexidine Gluconate Cloth  6 each Topical Daily  . enoxaparin (LOVENOX) injection  40 mg Subcutaneous Q24H  . insulin aspart  0-15 Units Subcutaneous TID WC  . insulin aspart  0-5 Units Subcutaneous QHS  . insulin aspart  3 Units Subcutaneous TID WC  . insulin detemir  0.3 Units/kg Subcutaneous Q24H   Continuous Infusions: . 0.9 % NaCl with KCl 20 mEq / L    .  ceFAZolin (ANCEF) IV 2 g (03/14/20 0845)  . dextrose 5% lactated ringers with KCl 20 mEq/L 125 mL/hr at 03/14/20 0715  . famotidine (PEPCID) IV Stopped (03/13/20 2154)  . insulin 2.2 Units/hr (03/14/20 1028)   PRN Meds:.dextrose   SUBJECTIVE: Mr. Arquette has a history of diabetes with recurrent DKA and cocaine abuse.  He was admitted 1 month ago with DKA then readmitted on 03/04/2020 with recurrent DKA and MSSA bacteremia.  He was started on cefazolin.  Repeat blood cultures on 03/06/2020 were negative.  There is no evidence  of endocarditis by TTE.  He left AGAINST MEDICAL ADVICE on 03/10/2020 but was readmitted yesterday after his sister called EMS.  He has DKA again and repeat blood culture grew MSSA again.  During his last hospitalization he was also noted to have a C4 fracture with prevertebral fluid.  I find no mention of severe neck pain to suggest cervical spine infection.  Review of Systems: Review of Systems  Unable to perform ROS: Other  Constitutional:       This is a remote evaluation so no review of systems was obtained.    No Known Allergies  OBJECTIVE: Vitals:   03/14/20 0700 03/14/20 0800 03/14/20 0900 03/14/20 1000  BP: 140/80 (!) 144/93 (!) 164/83 (!) 164/96  Pulse: 100 (!) 101 99 99  Resp: 17 (!) 24 20 20   Temp:  98.3 F (36.8 C)    TempSrc:  Axillary    SpO2: 96% 96% 97% 96%  Weight:      Height:       Body mass index is 19.07 kg/m.  Physical Exam Constitutional:      Comments: This is a remote evaluation so no physical exam was performed.     Lab Results Lab Results  Component Value Date   WBC 32.4 (H) 03/13/2020   HGB 11.9 (L) 03/13/2020   HCT 38.7 (L) 03/13/2020  MCV 100.8 (H) 03/13/2020   PLT 765 (H) 03/13/2020    Lab Results  Component Value Date   CREATININE 0.93 03/14/2020   BUN 34 (H) 03/14/2020   NA 146 (H) 03/14/2020   K 3.9 03/14/2020   CL 118 (H) 03/14/2020   CO2 20 (L) 03/14/2020    Lab Results  Component Value Date   ALT 18 03/13/2020   AST 16 03/13/2020   ALKPHOS 141 (H) 03/13/2020   BILITOT 3.0 (H) 03/13/2020     Microbiology: Recent Results (from the past 240 hour(s))  CULTURE, BLOOD (ROUTINE X 2) w Reflex to ID Panel     Status: Abnormal   Collection Time: 03/04/20  7:37 PM   Specimen: BLOOD  Result Value Ref Range Status   Specimen Description   Final    BLOOD BLOOD LEFT HAND Performed at [facility], [address] [place], [place] [address]    Special Requests   Final    BOTTLES DRAWN AEROBIC AND ANAEROBIC Blood  Culture adequate volume Performed at [facility], [address] [place], [place] [address]    Culture  Setup Time   Final    GRAM POSITIVE COCCI IN BOTH AEROBIC AND ANAEROBIC BOTTLES CRITICAL RESULT CALLED TO, READ BACK BY AND VERIFIED WITH: SCOTT HALL AT 0612 03/05/20.PMF Performed at [facility], [address] [place], [place] [address]    Culture (A)  Final    STAPHYLOCOCCUS AUREUS SUSCEPTIBILITIES PERFORMED ON PREVIOUS CULTURE WITHIN THE LAST 5 DAYS. Performed at [facility], [address] [place], [place] [address]    Report Status 03/07/2020 FINAL  Final  CULTURE, BLOOD (ROUTINE X 2) w Reflex to ID Panel     Status: Abnormal   Collection Time: 03/04/20  7:41 PM   Specimen: BLOOD  Result Value Ref Range Status   Specimen Description   Final    BLOOD RIGHT ANTECUBITAL Performed at [facility], [address] [place], [place] [address]    Special Requests   Final    BOTTLES DRAWN AEROBIC AND ANAEROBIC Blood Culture adequate volume Performed at [facility], [address] [place], [place] [address]    Culture  Setup Time   Final    GRAM POSITIVE COCCI IN BOTH AEROBIC AND ANAEROBIC BOTTLES CRITICAL RESULT CALLED TO, READ BACK BY AND VERIFIED WITH: SCOTT HALL AT 0612 03/05/20.PMF Performed at [facility], [address] [place], [place] [address]    Culture STAPHYLOCOCCUS AUREUS (A)  Final   Report Status 03/07/2020 FINAL  Final   Organism ID, Bacteria STAPHYLOCOCCUS AUREUS  Final      Susceptibility   Staphylococcus aureus - MIC*    CIPROFLOXACIN <=0.5 SENSITIVE Sensitive     ERYTHROMYCIN RESISTANT Resistant     GENTAMICIN <=0.5 SENSITIVE Sensitive     OXACILLIN 0.5 SENSITIVE Sensitive     TETRACYCLINE <=1 SENSITIVE Sensitive     VANCOMYCIN <=0.5 SENSITIVE Sensitive     TRIMETH/SULFA <=10 SENSITIVE Sensitive     CLINDAMYCIN RESISTANT Resistant     RIFAMPIN <=0.5 SENSITIVE Sensitive     Inducible  Clindamycin POSITIVE Resistant     * STAPHYLOCOCCUS AUREUS  Blood Culture ID Panel (Reflexed)     Status: Abnormal   Collection Time: 03/04/20  7:41 PM  Result Value Ref Range Status   Enterococcus species NOT DETECTED NOT DETECTED Final   Listeria monocytogenes NOT DETECTED NOT DETECTED Final   Staphylococcus species DETECTED (A) NOT DETECTED  Final    Comment: CRITICAL RESULT CALLED TO, READ BACK BY AND VERIFIED WITH: SCOTT HALL AT 0612 03/05/20.PMF    Staphylococcus aureus (BCID) DETECTED (A) NOT DETECTED Final    Comment: Methicillin (oxacillin) susceptible Staphylococcus aureus (MSSA). Preferred therapy is anti staphylococcal beta lactam antibiotic (Cefazolin or Nafcillin), unless clinically contraindicated. CRITICAL RESULT CALLED TO, READ BACK BY AND VERIFIED WITH: SCOTT HALL AT 0612 03/05/20.PMF    Methicillin resistance NOT DETECTED NOT DETECTED Final   Streptococcus species NOT DETECTED NOT DETECTED Final   Streptococcus agalactiae NOT DETECTED NOT DETECTED Final   Streptococcus pneumoniae NOT DETECTED NOT DETECTED Final   Streptococcus pyogenes NOT DETECTED NOT DETECTED Final   Acinetobacter baumannii NOT DETECTED NOT DETECTED Final   Enterobacteriaceae species NOT DETECTED NOT DETECTED Final   Enterobacter cloacae complex NOT DETECTED NOT DETECTED Final   Escherichia coli NOT DETECTED NOT DETECTED Final   Klebsiella oxytoca NOT DETECTED NOT DETECTED Final   Klebsiella pneumoniae NOT DETECTED NOT DETECTED Final   Proteus species NOT DETECTED NOT DETECTED Final   Serratia marcescens NOT DETECTED NOT DETECTED Final   Haemophilus influenzae NOT DETECTED NOT DETECTED Final   Neisseria meningitidis NOT DETECTED NOT DETECTED Final   Pseudomonas aeruginosa NOT DETECTED NOT DETECTED Final   Candida albicans NOT DETECTED NOT DETECTED Final   Candida glabrata NOT DETECTED NOT DETECTED Final   Candida krusei NOT DETECTED NOT DETECTED Final   Candida parapsilosis NOT DETECTED NOT  DETECTED Final   Candida tropicalis NOT DETECTED NOT DETECTED Final    Comment: Performed at [facility], [address] [place], [place] [address]  Culture, blood (Routine X 2) w Reflex to ID Panel     Status: None   Collection Time: 03/06/20 12:11 AM   Specimen: BLOOD  Result Value Ref Range Status   Specimen Description BLOOD BLOOD LEFT HAND  Final   Special Requests   Final    BOTTLES DRAWN AEROBIC AND ANAEROBIC Blood Culture adequate volume   Culture   Final    NO GROWTH 5 DAYS Performed at [facility], [address] [place], [place] [address]    Report Status 03/11/2020 FINAL  Final  Culture, blood (Routine X 2) w Reflex to ID Panel     Status: None   Collection Time: 03/06/20  3:41 AM   Specimen: BLOOD  Result Value Ref Range Status   Specimen Description BLOOD RIGHT ANTECUBITAL  Final   Special Requests   Final    BOTTLES DRAWN AEROBIC AND ANAEROBIC Blood Culture adequate volume   Culture   Final    NO GROWTH 5 DAYS Performed at [facility], [address] [place], [place] [address]    Report Status 03/11/2020 FINAL  Final  Respiratory Panel by RT PCR (Flu A&B, Covid) - Nasopharyngeal Swab     Status: None   Collection Time: 03/13/20  7:33 PM   Specimen: Nasopharyngeal Swab  Result Value Ref Range Status   SARS Coronavirus 2 by RT PCR NEGATIVE NEGATIVE Final    Comment: (NOTE) SARS-CoV-2 target nucleic acids are NOT DETECTED. The SARS-CoV-2 RNA is generally detectable in upper respiratoy specimens during the acute phase of infection. The lowest concentration of SARS-CoV-2 viral copies this assay can detect is 131 copies/mL. A negative result does not preclude SARS-Cov-2 infection and should not be used as the sole basis for treatment or other patient management decisions. A negative result may occur with  improper specimen collection/handling, submission of specimen other  than nasopharyngeal swab, presence of viral mutation(s)  within the areas targeted by this assay, and inadequate number of viral copies (<131 copies/mL). A negative result must be combined with clinical observations, patient history, and epidemiological information. The expected result is Negative. Fact Sheet for Patients:  PinkCheek.be Fact Sheet for Healthcare Providers:  GravelBags.it This test is not yet ap proved or cleared by the [place] FDA and  has been authorized for detection and/or diagnosis of SARS-CoV-2 by FDA under an Emergency Use Authorization (EUA). This EUA will remain  in effect (meaning this test can be used) for the duration of the COVID-19 declaration under Section 564(b)(1) of the Act, 21 U.S.C. section 360bbb-3(b)(1), unless the authorization is terminated or revoked sooner.    Influenza A by PCR NEGATIVE NEGATIVE Final   Influenza B by PCR NEGATIVE NEGATIVE Final    Comment: (NOTE) The Xpert Xpress SARS-CoV-2/FLU/RSV assay is intended as an aid in  the diagnosis of influenza from Nasopharyngeal swab specimens and  should not be used as a sole basis for treatment. Nasal washings and  aspirates are unacceptable for Xpert Xpress SARS-CoV-2/FLU/RSV  testing. Fact Sheet for Patients: PinkCheek.be Fact Sheet for Healthcare Providers: GravelBags.it This test is not yet approved or cleared by the [place] FDA and  has been authorized for detection and/or diagnosis of SARS-CoV-2 by  FDA under an Emergency Use Authorization (EUA). This EUA will remain  in effect (meaning this test can be used) for the duration of the  Covid-19 declaration under Section 564(b)(1) of the Act, 21  U.S.C. section 360bbb-3(b)(1), unless the authorization is  terminated or revoked. Performed at [facility], [address] [place], [place] [address]   Blood culture (routine x 2)     Status: None (Preliminary  result)   Collection Time: 03/13/20  7:33 PM   Specimen: BLOOD  Result Value Ref Range Status   Specimen Description BLOOD LEFT HAND  Final   Special Requests   Final    BOTTLES DRAWN AEROBIC AND ANAEROBIC Blood Culture results may not be optimal due to an inadequate volume of blood received in culture bottles   Culture  Setup Time   Final    Organism ID to follow GRAM POSITIVE COCCI IN BOTH AEROBIC AND ANAEROBIC BOTTLES CRITICAL RESULT CALLED TO, READ BACK BY AND VERIFIED WITH: [facility] HALLAJI AT 7371 03/14/20.PMF Performed at [facility], [address] [place], [place] [address]    Culture [facility] POSITIVE COCCI  Final   Report Status PENDING  Incomplete  Blood culture (routine x 2)     Status: None (Preliminary result)   Collection Time: 03/13/20  7:33 PM   Specimen: BLOOD  Result Value Ref Range Status   Specimen Description BLOOD LEFT AC  Final   Special Requests   Final    BOTTLES DRAWN AEROBIC AND ANAEROBIC Blood Culture results may not be optimal due to an inadequate volume of blood received in culture bottles   Culture  Setup Time   Final    GRAM POSITIVE COCCI IN BOTH AEROBIC AND ANAEROBIC BOTTLES CRITICAL RESULT CALLED TO, READ BACK BY AND VERIFIED WITH: [facility] HALLAJI AT [id number] 03/14/20.PMF Performed at [facility], [address] [place], [place] [address]    Culture [facility] POSITIVE COCCI  Final   Report Status PENDING  Incomplete  Blood Culture ID Panel (Reflexed)     Status: Abnormal   Collection Time: 03/13/20  7:33 PM  Result Value Ref Range Status  Enterococcus species NOT DETECTED NOT DETECTED Final   Listeria monocytogenes NOT DETECTED NOT DETECTED Final   Staphylococcus species DETECTED (A) NOT DETECTED Final    Comment: CRITICAL RESULT CALLED TO, READ BACK BY AND VERIFIED WITH: [facility] HALLAJI AT 9326 03/14/20.PMF    Staphylococcus aureus (BCID) DETECTED (A) NOT DETECTED Final    Comment: Methicillin (oxacillin) susceptible Staphylococcus  aureus (MSSA). Preferred therapy is anti staphylococcal beta lactam antibiotic (Cefazolin or Nafcillin), unless clinically contraindicated. CRITICAL RESULT CALLED TO, READ BACK BY AND VERIFIED WITH: [facility] HALLAJI AT 7124 03/14/20.PMF    Methicillin resistance NOT DETECTED NOT DETECTED Final   Streptococcus species NOT DETECTED NOT DETECTED Final   Streptococcus agalactiae NOT DETECTED NOT DETECTED Final   Streptococcus pneumoniae NOT DETECTED NOT DETECTED Final   Streptococcus pyogenes NOT DETECTED NOT DETECTED Final   Acinetobacter baumannii NOT DETECTED NOT DETECTED Final   Enterobacteriaceae species NOT DETECTED NOT DETECTED Final   Enterobacter cloacae complex NOT DETECTED NOT DETECTED Final   Escherichia coli NOT DETECTED NOT DETECTED Final   Klebsiella oxytoca NOT DETECTED NOT DETECTED Final   Klebsiella pneumoniae NOT DETECTED NOT DETECTED Final   Proteus species NOT DETECTED NOT DETECTED Final   Serratia marcescens NOT DETECTED NOT DETECTED Final   Haemophilus influenzae NOT DETECTED NOT DETECTED Final   Neisseria meningitidis NOT DETECTED NOT DETECTED Final   Pseudomonas aeruginosa NOT DETECTED NOT DETECTED Final   Candida albicans NOT DETECTED NOT DETECTED Final   Candida glabrata NOT DETECTED NOT DETECTED Final   Candida krusei NOT DETECTED NOT DETECTED Final   Candida parapsilosis NOT DETECTED NOT DETECTED Final   Candida tropicalis NOT DETECTED NOT DETECTED Final    Comment: Performed at [facility], [address], [place], [place] [address]    Cliffton Asters, MD [facility] for Infectious Disease [facility] [phone] pager   [phone] cell 03/14/2020, 11:46 AM
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
## 2020-03-14 NOTE — Progress Notes (Addendum)
PHARMACY - PHYSICIAN COMMUNICATION CRITICAL VALUE ALERT - BLOOD CULTURE IDENTIFICATION (BCID)  Spencer [name] is an 59 y.o. male who presented to [facility] on 03/13/2020 with a chief complaint of Confusion, weakness  Assessment: Bcx positive 4 of 4 for GPC. BCID identified MSSA. Note: Patient's Bcx positive 4/29 for MSSA. Patient left AMA 5/4.  Name of physician (or Provider) Contacted: Dr.  Beverly Gust    Current antibiotics: None  Changes to prescribed antibiotics recommended:  Start Cefazolin   Results for orders placed or performed during the hospital encounter of 03/13/20  Blood Culture ID Panel (Reflexed) (Collected: 03/13/2020  7:33 PM)  Result Value Ref Range   Enterococcus species NOT DETECTED NOT DETECTED   Listeria monocytogenes NOT DETECTED NOT DETECTED   Staphylococcus species DETECTED (A) NOT DETECTED   Staphylococcus aureus (BCID) DETECTED (A) NOT DETECTED   Methicillin resistance NOT DETECTED NOT DETECTED   Streptococcus species NOT DETECTED NOT DETECTED   Streptococcus agalactiae NOT DETECTED NOT DETECTED   Streptococcus pneumoniae NOT DETECTED NOT DETECTED   Streptococcus pyogenes NOT DETECTED NOT DETECTED   Acinetobacter baumannii NOT DETECTED NOT DETECTED   Enterobacteriaceae species NOT DETECTED NOT DETECTED   Enterobacter cloacae complex NOT DETECTED NOT DETECTED   Escherichia coli NOT DETECTED NOT DETECTED   Klebsiella oxytoca NOT DETECTED NOT DETECTED   Klebsiella pneumoniae NOT DETECTED NOT DETECTED   Proteus species NOT DETECTED NOT DETECTED   Serratia marcescens NOT DETECTED NOT DETECTED   Haemophilus influenzae NOT DETECTED NOT DETECTED   Neisseria meningitidis NOT DETECTED NOT DETECTED   Pseudomonas aeruginosa NOT DETECTED NOT DETECTED   Candida albicans NOT DETECTED NOT DETECTED   Candida glabrata NOT DETECTED NOT DETECTED   Candida krusei NOT DETECTED NOT DETECTED   Candida parapsilosis NOT DETECTED NOT DETECTED   Candida tropicalis NOT DETECTED NOT  DETECTED    Gardner Candle, PharmD, BCPS Clinical Pharmacist 03/14/2020 8:26 AM

## 2020-03-14 NOTE — Progress Notes (Signed)
Inpatient Diabetes Program Recommendations  AACE/ADA: New Consensus Statement on Inpatient Glycemic Control (2015)  Target Ranges:  Prepandial:   less than 140 mg/dL      Peak postprandial:   less than 180 mg/dL (1-2 hours)      Critically ill patients:  140 - 180 mg/dL   Lab Results  Component Value Date   GLUCAP 170 (H) 03/14/2020   HGBA1C 12.6 (H) 02/15/2020    Review of Glycemic Control Results for JOSEP, LUVIANO (MRN [id number]) as of 03/14/2020 13:50  Ref. Range 03/14/2020 08:38 03/14/2020 09:25 03/14/2020 10:25 03/14/2020 11:49  Glucose-Capillary Latest Ref Range: 70 - 99 mg/dL 340 (H) 352 (H) 481 (H) 170 (H)   Diabetes history: DM 2 Outpatient Diabetes medications:  Novolog 12 units tid with meals Lantus 18 units daily Current orders for Inpatient glycemic control:  Levemir 18 units daily Novolog 3 units tid with meals Novolog moderate tid with meals and HS  Inpatient Diabetes Program Recommendations:    Agree with current orders.  Patient transitioned off insulin drip today.  Still lethargic.  Will f/u with patient on 5/10 regarding DM management.   Thanks,  Beryl Meager, RN, BC-ADM Inpatient Diabetes Coordinator Pager [phone] (8a-5p)

## 2020-03-14 NOTE — Consult Note (Addendum)
Pharmacy Antibiotic Note  Spencer [name] is a 59 y.o. male admitted on 03/13/2020 with confusion and weakness. Patient to found to have positive blood cultures. BCx 4 of 4 GPC and BCID identified MSSA.  Pharmacy has been consulted for Cefazolin dosing.  Note: Patient's Bcx positive 4/29 for MSSA. Patient left AMA 5/4.    Plan: Start Cefazolin 2g IV every 8 hours  Height: 5\' 10"  (177.8 cm) Weight: 60.3 kg (132 lb 15 oz) IBW/kg (Calculated) : 73  Temp (24hrs), Avg:98.5 F (36.9 C), Min:97.5 F (36.4 C), Max:100.5 F (38.1 C)  Recent Labs  Lab 03/09/20 0854 03/13/20 1659 03/13/20 2114 03/14/20 0148 03/14/20 0424  WBC  --  32.4*  --   --   --   CREATININE 0.84 1.89* 1.61* 0.98 0.84    Estimated Creatinine Clearance: 78.8 mL/min (by C-G formula based on SCr of 0.84 mg/dL).    No Known Allergies  Antimicrobials this admission: 5/9 Cefazolin >>   Microbiology results: 5/8 BCx:  4 of 4 GPC  5/8 BCID: MSSA  Thank you for allowing pharmacy to be a part of this patient's care.  7/8, PharmD, BCPS Clinical Pharmacist 03/14/2020 8:32 AM

## 2020-03-14 NOTE — Progress Notes (Signed)
Pt transferred from ICU.  MEWS score 2 due to BP 158/88 and documentation that pt responds to pain.  Pt lethargic and sleepy, but easily aroused.  This is not new as per report received from ICU nurse.  Pt answers appropriately, but than continues to sleep. Dr Georgeann Oppenheim made aware.  Continue IVF with D5%/LR/KCl.

## 2020-03-15 LAB — BASIC METABOLIC PANEL
Anion gap: 8 (ref 5–15)
BUN: 17 mg/dL (ref 8–23)
CO2: 25 mmol/L (ref 22–32)
Calcium: 8.6 mg/dL — ABNORMAL LOW (ref 8.9–10.3)
Chloride: 104 mmol/L (ref 98–111)
Creatinine, Ser: 0.64 mg/dL (ref 0.61–1.24)
GFR calc Af Amer: >60 mL/min (ref >60)
GFR calc non Af Amer: >60 mL/min (ref >60)
Glucose, Bld: 329 mg/dL — ABNORMAL HIGH (ref 70–99)
Potassium: 3.4 mmol/L — ABNORMAL LOW (ref 3.5–5.1)
Sodium: 137 mmol/L (ref 135–145)

## 2020-03-15 LAB — GLUCOSE, CAPILLARY
Glucose-Capillary: 368 mg/dL — ABNORMAL HIGH (ref 70–99)
Glucose-Capillary: 424 mg/dL — ABNORMAL HIGH (ref 70–99)
Glucose-Capillary: 280 mg/dL — ABNORMAL HIGH (ref 70–99)
Glucose-Capillary: 140 mg/dL — ABNORMAL HIGH (ref 70–99)

## 2020-03-15 MED ORDER — INSULIN DETEMIR 100 UNIT/ML ~~LOC~~ SOLN
22.0000 [IU] | SUBCUTANEOUS | Status: DC
  Administered 2020-03-16: 08:00:00 22 [IU] via SUBCUTANEOUS
  Filled 2020-03-15 (×2): qty 0.22

## 2020-03-15 MED ORDER — ADULT MULTIVITAMIN W/MINERALS CH
1.0000 | ORAL_TABLET | Freq: Every day | ORAL | Status: DC
  Administered 2020-03-16 – 2020-04-28 (×44): 1 via ORAL
  Filled 2020-03-15 (×44): qty 1

## 2020-03-15 MED ORDER — ENSURE MAX PROTEIN PO LIQD
11.0000 [oz_av] | Freq: Two times a day (BID) | ORAL | Status: DC
  Administered 2020-03-16 – 2020-03-29 (×25): 11 [oz_av] via ORAL
  Filled 2020-03-15: qty 330

## 2020-03-15 MED ORDER — INSULIN ASPART 100 UNIT/ML ~~LOC~~ SOLN
6.0000 [IU] | Freq: Three times a day (TID) | SUBCUTANEOUS | Status: DC
  Administered 2020-03-15 – 2020-03-16 (×2): 6 [IU] via SUBCUTANEOUS
  Filled 2020-03-15 (×2): qty 1

## 2020-03-15 MED ORDER — ACETAMINOPHEN 325 MG PO TABS
650.0000 mg | ORAL_TABLET | Freq: Four times a day (QID) | ORAL | Status: DC | PRN
  Administered 2020-03-15 – 2020-04-07 (×10): 650 mg via ORAL
  Filled 2020-03-15 (×10): qty 2

## 2020-03-15 NOTE — TOC Initial Note (Signed)
Transition of Care [facility]) - Initial/Assessment Note    Patient Details  Name: Spencer [name] MRN: [id number] Date of Birth: October 12, 1961  [facility]) CM/SW Contact:    Elease Hashimoto, LCSW Phone Number: 03/15/2020, 9:29 AM  Clinical Narrative:    Met with pt who reports he needs a PCP. Asked who is listed on his Medicaid card, he says no one. He lives with his sister-Barbara she does work so he is home alone at times. He has been independent and takes care of himself. He is not compliant with his diabetes but does take insulin. He does not drive but relies on family to transport him to appointments. Pt does have a PCP and have placed in his chart. Will continue to follow and see if has needs, pt seems to do what he wants to do.               Expected Discharge Plan: Home/Self Care Barriers to Discharge: Continued Medical Work up   Patient Goals and CMS Choice Patient states their goals for this hospitalization and ongoing recovery are:: Go home and feel better      Expected Discharge Plan and Services Expected Discharge Plan: Home/Self Care In-house Referral: Clinical Social Work Discharge Planning Services: Follow-up appt scheduled   Living arrangements for the past 2 months: Single Family Home                                      Prior Living Arrangements/Services Living arrangements for the past 2 months: Single Family Home Lives with:: Siblings Patient language and need for interpreter reviewed:: Yes Do you feel safe going back to the place where you live?: Yes      Need for Family Participation in Patient Care: Yes (Comment) Care giver support system in place?: Yes (comment)   Criminal Activity/Legal Involvement Pertinent to Current Situation/Hospitalization: No - Comment as needed  Activities of Daily Living      Permission Sought/Granted Permission sought to share information with : Family Supports Permission granted to share information with :  Yes, Verbal Permission Granted  Share Information with NAME: [name]     Permission granted to share info w Relationship: sister     Emotional Assessment Appearance:: Appears older than stated age Attitude/Demeanor/Rapport: Self-Absorbed Affect (typically observed): Apprehensive, Blunt   Alcohol / Substance Use: Illicit Drugs Psych Involvement: No (comment)  Admission diagnosis:  DKA (diabetic ketoacidoses) ([place]) [E11.10] Diabetic ketoacidosis without coma associated with type 1 diabetes mellitus ([place]) [E10.10] Patient Active Problem List   Diagnosis Date Noted  . MSSA bacteremia 03/14/2020  . Cigarette smoker 03/14/2020  . Pressure injury of skin 03/14/2020  . C4 cervical fracture ([place]) 03/05/2020  . Atrial fibrillation with RVR ([place]) 08/07/2019  . GERD (gastroesophageal reflux disease) 08/07/2019  . AKI (acute kidney injury) ([place]) 02/19/2019  . Diabetic neuropathy ([place]) 01/16/2017  . DM2 (diabetes mellitus, type 2) ([place]) 01/16/2017  . History of cocaine abuse ([place]) 01/16/2017  . Personal history of subdural hematoma 01/16/2017  . Protein-calorie malnutrition, severe 11/08/2016  . DKA (diabetic ketoacidoses) ([place]) 04/03/2015  . Hypertension 04/03/2015  . Depression 04/03/2015  . Opiate dependence ([place]) 04/03/2015  . Type II or unspecified type diabetes mellitus without mention of complication, not stated as uncontrolled 07/19/2014  . Hernia of flank 09/20/2012   PCP:  Patient, No Pcp Per Pharmacy:   [facility] [phone] -  [address], [address] [place] [address] Phone: [phone] Fax: [phone]     Social Determinants of Health (SDOH) Interventions    Readmission Risk Interventions Readmission Risk Prevention Plan 03/05/2020 02/16/2020 12/17/2019  Transportation Screening Complete Complete Complete  PCP or Specialist Appt within 3-5 Days - - -  [place] or [place] or Home Care Consult comments -  - -  Palliative Care Screening - - -  Medication Review ([place]) Complete Complete Complete  PCP or Specialist appointment within 3-5 days of discharge Patient refused Complete Patient refused  [place] or [place] Complete Complete Patient refused  SW Recovery Care/Counseling Consult - - -  SW Consult Not Complete Comments - - -  Palliative Care Screening - - Not Applicable  Skilled Nursing Facility Complete Complete Not Applicable  Some recent data might be hidden

## 2020-03-15 NOTE — Progress Notes (Addendum)
Inpatient Diabetes Program Recommendations  AACE/ADA: New Consensus Statement on Inpatient Glycemic Control (2015)  Target Ranges:  Prepandial:   less than 140 mg/dL      Peak postprandial:   less than 180 mg/dL (1-2 hours)      Critically ill patients:  140 - 180 mg/dL   Results for Spencer [name], Spencer [name] (MRN [id number]) as of 03/15/2020 07:08  Ref. Range 03/14/2020 09:25 03/14/2020 10:25 03/14/2020 11:49 03/14/2020 16:46 03/14/2020 20:28  Glucose-Capillary Latest Ref Range: 70 - 99 mg/dL 202 (H) 189 (H)  18 units LEVEMIR given at 10am 170 (H)  6 units NOVOLOG   IV Insulin Drip stopped 135 (H)  2 units NOVOLOG  191 (H)     Home DM Meds: Lantus 18 units Daily       Novolog 12 units TID  Current Orders: Levemir 22 units Daily      Novolog Moderate Correction Scale/ SSI (0-15 units) TID AC + HS      Novolog 6 units TID     Patient is very well known to the Inpatient Diabetes Team--12 admissions in 2020 and this is pt's 5th admission this year since January.  Has been counseled on past admissions about the importance of glucose control.  Note pt transitioned to SQ Insulin yesterday AM.  CBGs very high this AM and at 12pm.  Levemir dose increased for tomorrow AM and Novolog Meal Coverage increased for 5pm tonight.   Met with patient at bedside this afternoon.  Pt was [name] bit sarcastic and told me he didn't need to see me but then said "well go ahead and come in---Yes I have diabetes-whatcha gonna do about it?".  I proceeded to ask him why he has had so may admissions for DKA this year since January and I asked him what I could do to help him and he stated to me "just give me [name] and that will make it better".  I asked pt if he was serious about what he just said and he stated "No, i'm kidding".  Told me he has insulin at home.  Isn't always consistent with taking.  Stated his sister yells at him for not getting up on time and taking his meds on time.  Does not have PCP.  Told me he ran out of CBG  meter strips about 3 weeks ago and hasn't been checking CBGs.  Discussed w/ pt the importance of checking CBGs, taking insulin, long-term complications.  Asked pt again what I could do to help.  All he said this time was get me [name] Rx for meter strips.  I am at [name] loss as to how to help this patient.     --Will follow patient during hospitalization--   [name]  RN, MSN, CDE Diabetes Coordinator Inpatient Glycemic Control Team Team Pager: [phone] (8a-5p)                   

## 2020-03-15 NOTE — Progress Notes (Signed)
Initial Nutrition Assessment  DOCUMENTATION CODES:   Severe malnutrition in context of social or environmental circumstances  INTERVENTION:  Recommend liberalizing diet to carbohydrate modified.  Provide Ensure Max Protein po BID, each supplement provides 150 kcal and 30 grams of protein.  Provide double protein portions at meals.  Provide daily MVI.  Monitor magnesium, potassium, and phosphorus daily for at least 3 days, MD to replete as needed, as pt is at risk for refeeding syndrome given severe malnutrition.  NUTRITION DIAGNOSIS:   Severe Malnutrition related to social / environmental circumstances(hx substance abuse, inadequate oral intake) as evidenced by severe fat depletion, severe muscle depletion.  GOAL:   Patient will meet greater than or equal to 90% of their needs  MONITOR:   PO intake, Supplement acceptance, Labs, Weight trends, I & O's, Skin  REASON FOR ASSESSMENT:   Consult Assessment of nutrition requirement/status  ASSESSMENT:   59 year old male with PMHx of HTN, substance abuse, DM, GERD, brain aneurysm admitted with DKA, MSSA bacteremia, AKI, metabolic encephalopathy.   Met with patient at bedside. He is known to this RD from previous admissions. Patient was not amenable to providing a very detailed history today. He kept waving his hand at RD and would not answer questions. He does endorse his appetite PTA was poor. He is documented to have eaten 100% of his breakfast this morning. His lunch tray was untouched at bedside at time of RD assessment. Patient is amenable to drinking Ensure Max to help meet calorie/protein needs.  Patient does report he has remained weight-stable at around 130 lbs. He is currently 60.3 kg (132.94 lbs).  Medications reviewed and include: Novolog 0-15 units TID, Novolog 0-5 units QHS, Novolog 6 units TID, cefazolin, famotidine.  Labs reviewed: CBG 135-191, Potassium 3.4.  NUTRITION - FOCUSED PHYSICAL EXAM:    Most Recent  Value  Orbital Region  Severe depletion  Upper Arm Region  Severe depletion  Thoracic and Lumbar Region  Severe depletion  Buccal Region  Severe depletion  Temple Region  Severe depletion  Clavicle Bone Region  Severe depletion  Clavicle and Acromion Bone Region  Severe depletion  Scapular Bone Region  Unable to assess  Dorsal Hand  Severe depletion  Patellar Region  Severe depletion  Anterior Thigh Region  Severe depletion  Posterior Calf Region  Severe depletion  Edema (RD Assessment)  None  Hair  Reviewed  Eyes  Reviewed  Mouth  Unable to assess  Skin  Reviewed  Nails  Reviewed     Diet Order:   Diet Order            Diet heart healthy/carb modified Room service appropriate? Yes; Fluid consistency: Thin  Diet effective now             EDUCATION NEEDS:   Not appropriate for education at this time  Skin:  Skin Assessment: Skin Integrity Issues:(Stage 2 right ischial tuberosity; unstageable sacrum (1cm x 1.8cm); unstageable right elbow (2cm x 1.5cm x 0.1cm))  Last BM:  Unknown  Height:   Ht Readings from Last 1 Encounters:  03/13/20 5' 10"  (1.778 m)   Weight:   Wt Readings from Last 1 Encounters:  03/13/20 60.3 kg   Ideal Body Weight:  75.5 kg  BMI:  Body mass index is 19.07 kg/m.  Estimated Nutritional Needs:   Kcal:  1800-2000  Protein:  90-100 grams  Fluid:  1.8-2 L/day  Jacklynn Barnacle, MS, RD, LDN Pager number available on Amion
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
## 2020-03-16 LAB — URINE DRUG SCREEN, QUALITATIVE (ARMC ONLY)
Amphetamines, Ur Screen: NOT DETECTED
Barbiturates, Ur Screen: NOT DETECTED
Benzodiazepine, Ur Scrn: NOT DETECTED
Cannabinoid 50 Ng, Ur ~~LOC~~: NOT DETECTED
Cocaine Metabolite,Ur ~~LOC~~: NOT DETECTED
MDMA (Ecstasy)Ur Screen: NOT DETECTED
Methadone Scn, Ur: NOT DETECTED
Opiate, Ur Screen: NOT DETECTED
Phencyclidine (PCP) Ur S: NOT DETECTED
Tricyclic, Ur Screen: NOT DETECTED

## 2020-03-16 LAB — GLUCOSE, CAPILLARY
Glucose-Capillary: 439 mg/dL — ABNORMAL HIGH (ref 70–99)
Glucose-Capillary: 482 mg/dL — ABNORMAL HIGH (ref 70–99)
Glucose-Capillary: 387 mg/dL — ABNORMAL HIGH (ref 70–99)
Glucose-Capillary: 218 mg/dL — ABNORMAL HIGH (ref 70–99)

## 2020-03-16 LAB — CULTURE, BLOOD (ROUTINE X 2)

## 2020-03-16 LAB — BASIC METABOLIC PANEL
Anion gap: 10 (ref 5–15)
BUN: 22 mg/dL (ref 8–23)
CO2: 24 mmol/L (ref 22–32)
Calcium: 8.4 mg/dL — ABNORMAL LOW (ref 8.9–10.3)
Chloride: 98 mmol/L (ref 98–111)
Creatinine, Ser: 0.71 mg/dL (ref 0.61–1.24)
GFR calc Af Amer: >60 mL/min (ref >60)
GFR calc non Af Amer: >60 mL/min (ref >60)
Glucose, Bld: 445 mg/dL — ABNORMAL HIGH (ref 70–99)
Potassium: 4 mmol/L (ref 3.5–5.1)
Sodium: 132 mmol/L — ABNORMAL LOW (ref 135–145)

## 2020-03-16 MED ORDER — FAMOTIDINE 20 MG PO TABS
20.0000 mg | ORAL_TABLET | Freq: Every day | ORAL | Status: DC
  Administered 2020-03-16 – 2020-04-27 (×42): 20 mg via ORAL
  Filled 2020-03-16 (×42): qty 1

## 2020-03-16 MED ORDER — METHOCARBAMOL 500 MG PO TABS
500.0000 mg | ORAL_TABLET | Freq: Three times a day (TID) | ORAL | Status: DC
  Administered 2020-03-16 – 2020-04-27 (×128): 500 mg via ORAL
  Filled 2020-03-16 (×133): qty 1

## 2020-03-16 MED ORDER — INSULIN ASPART 100 UNIT/ML ~~LOC~~ SOLN
10.0000 [IU] | Freq: Three times a day (TID) | SUBCUTANEOUS | Status: DC
  Administered 2020-03-16 – 2020-03-21 (×13): 10 [IU] via SUBCUTANEOUS
  Filled 2020-03-16 (×14): qty 1

## 2020-03-16 NOTE — Consult Note (Signed)
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 as frontal and lateral right hip images obtained. No fracture or dislocation. There is mild symmetric narrowing of each hip joint. No erosive change. There is a stable sclerotic focus in the lateral right femoral neck measuring 1.5 x 1.5 cm. IMPRESSION: No fracture or dislocation. Slight symmetric narrowing of each hip joint. Stable sclerotic focus in the right femoral neck which potentially may represent a bone island. Stability since 2017 is felt to be indicative of benign etiology. Electronically Signed   By: Bretta Bang III M.D.   On: 03/03/2020 13:35    EKG: Normal sinus rhythm  Weights: Filed Weights   03/13/20 1654 03/13/20 2055  Weight: 59 kg 60.3 kg     Physical  Exam: Blood pressure (!) 151/92, pulse 90, temperature 98.6 F (37 C), resp. rate 14, height 5\' 10"  (1.778 m), weight 60.3 kg, SpO2 97 %. Body mass index is 19.07 kg/m. General: Well developed, well nourished, in no acute distress. Head eyes ears nose throat: Normocephalic, atraumatic, sclera non-icteric, no xanthomas, nares are without discharge. No apparent thyromegaly and/or mass  Lungs: Normal respiratory effort.  no wheezes, no rales, no rhonchi.  Heart: RRR with normal S1 S2. no murmur gallop, no rub, PMI is normal size and placement, carotid upstroke normal without bruit, jugular venous  pressure is normal Abdomen: Soft, non-tender, non-distended with normoactive bowel sounds. No hepatomegaly. No rebound/guarding. No obvious abdominal masses. Abdominal aorta is normal size without bruit Extremities: No edema. no cyanosis, no clubbing, no ulcers  Peripheral : 2+ bilateral upper extremity pulses, 2+ bilateral femoral pulses, 2+ bilateral dorsal pedal pulse Neuro: Alert and oriented. No facial asymmetry. No focal deficit. Moves all extremities spontaneously. Musculoskeletal: Normal muscle tone without kyphosis Psych:  Responds to questions appropriately with a normal affect.    Assessment: 59 year old male with hypertension hyperlipidemia diabetes coronary atherosclerosis with recent fall and injury DKA encephalopathy slowly improving with methicillin sensitive Staph aureus bacteremia now cleared with appropriate medication management and antibiotics needing further assessment including transesophageal echocardiogram to assess for possible endocarditis.  Patient understands the risk and benefits of transesophageal echocardiogram.  This includes the possibility of death stroke esophageal perforation side effects of medications for sedation and sore throat.  The patient is at low risk for both general and/or moderate sedation  Plan: 1.  Continue treatment for methicillin sensitive Staph aureus bacteremia slowly improving 2.  Proceed to transesophageal echocardiogram to assess for possible endocarditis 3.  Treatment DKA encephalopathy and diabetes as per above 4.  Further treatment options after above  Signed, 75 M.D. [facility] 03/16/2020, 5:19 PM

## 2020-03-16 NOTE — Progress Notes (Signed)
PROGRESS NOTE    Spencer [name]  MAU:[id number] DOB: 08-19-1961 DOA: 03/13/2020 PCP: Patient, No Pcp Per   Brief Narrative:  HPI: Spencer [name] is a 59 y.o. male with medical history significant for type 2 diabetes mellitus, cocaine abuse,brain aneurysm,hypertension, GERD.  Patient was recently admitted on 03/03/20 for DKA, acute renal failure and patient signed out against medical advice on 03/10/2020.  Patient brought in by EMS.  Patient is not sure why he is here.  As per him his sister called EMS.  Patient states his blood glucose levels were very high.  When asked how compliant is he with his insulin he told me that he is taking his insulin but he does have a history of noncompliance.  He denies any chest pain, shortness of breath.  Denies any abdominal pain, nausea or any vomitings.  No fevers or chills.  Denies any diarrhea.  Does complain of constipation.  Does state that he has not been able to urinate much today.  5/9: Patient seen and examined.  Anion gap closed.  Glycemic control improved with Endo tool protocol.  On my interview patient is lethargic in bed.  He eyes closed.  He does open his eyes and answer questions upon prompting.  Appears irritable.  Blood culture positive for MSSA.  Infectious disease was already aware of this patient.  Recommended Ancef and cardiology consultation for TEE.  5/10: Patient seen and examined.  Remains sleepy and lethargic but easily awakened.  On subcutaneous insulin regimen.  Fever noted this morning.  T-max 101.  Tylenol given.  Pending formal cardiology evaluation and recommendations in regards to TEE.  5/11: Patient seen and examined. Much more awake this morning. Pleasant answers all questions appropriately. No fevers noted over interval. Surveillance blood cultures remain no growth to date. Still pending cardiology recommendations regarding TEE.   Assessment & Plan:   Principal Problem:   MSSA bacteremia Active Problems:   DKA (diabetic  ketoacidoses) (HCC)   Hypertension   Diabetic neuropathy (HCC)   DM2 (diabetes mellitus, type 2) (HCC)   History of cocaine abuse (HCC)   Personal history of subdural hematoma   AKI (acute kidney injury) (HCC)   GERD (gastroesophageal reflux disease)   C4 cervical fracture (HCC)   Cigarette smoker   Pressure injury of skin    Acute diabetic ketoacidosis in a patient with type 2 diabetes mellitus, POA, resolved Acute metabolic acidosis secondary to DKA, POA, resolved -Anion gap of 30, serum bicarb level of 8 on admission -Patient was given 1 L of LR in the ED ER.   Was given another 2 L of normal saline bolus then placement 150 cc of normal saline per hour. -Was given insulin bolus, initiated on insulin drip in the ER as per the DKA protocol -Anion gap closed -Sugars improved Plan: Continue subcutaneous insulin regimen Carb modified diet CBG before meals and at bedtime Diabetes coordinator consult Patient adherence to insulin regimen and treatment protocol is greatly in question  MSSA bacteremia Patient was noted to have MSSA bacteremia on previous admission.   At that time he left AGAINST MEDICAL ADVICE.   He was recommended for TEE for evaluation endocarditis.  However he left AMA before this could be performed.   Infectious disease has been consulted.   Again recommending Ancef and cardiology consult for TEE.   Repeat blood cultures ordered for tomorrow.   Cards re-consulted, messaged Dr Gwen Pounds 5/9 Plan: Pending cardiology recommendations regarding TEE Continue Ancef Follow surveillance cultures,  no growth to date ID consulted, evaluated over the weekend.  Message sent to Dr. Rivka Saferavishankar today  Acute kidney injury secondary to dehydration, POA, improved Acute dehydration, POA -Avoid nephrotoxic agents -Monitor input and output -IV fluid hydration aggressively  Mild hyperkalemia, POA -Continue to monitor the potassium levels with IV fluid  hydration  Hypertension, benign essential - Continue amlodipine - Restart losartan  Metabolic encephalopathy Likely multifactorial Lethargic but answering all questions appropriately Possibly secondary to substance abuse, DKA, MSSA bacteremia Continue to monitor mental status  C4 fracture Was evaluated by NSG previous admission Recommended Aspen collar at all times, which patient has not been compliant with Will request Aspen collar from materials Treat with Robaxin Avoid narcotics Per previous consult note by neurosurgery patient has been cleared for TEE but must wear Aspen collar for the duration of the procedure.   DVT prophylaxis: Lovenox Code Status: Full Family Communication: None today  disposition Plan: Status is: Inpatient  Remains inpatient appropriate because:Inpatient level of care appropriate due to severity of illness   Dispo: The patient is from: Home              Anticipated d/c is to: Home              Anticipated d/c date is: 2 days              Patient currently is not medically stable to d/c.  MSSA bacteremia on IV antibiotics.  Resolved DKA.  Pending cardiology evaluation for TEE   Consultants:   ID  Cardiology  Procedures:   2D echo  Antimicrobials:   Cefazolin, 03/14/2020-   Subjective: Seen and examined No complaints Much more awake this morning  Objective: Vitals:   03/15/20 0751 03/15/20 1655 03/15/20 2335 03/16/20 0733  BP: (!) 173/93  (!) 146/95 140/87  Pulse: 93  92 82  Resp: 18  18 15   Temp: (!) 101 F (38.3 C) 98.9 F (37.2 C) 98.6 F (37 C) 99.3 F (37.4 C)  TempSrc: Oral  Oral Oral  SpO2: 97%  97% 95%  Weight:      Height:        Intake/Output Summary (Last 24 hours) at 03/16/2020 1049 Last data filed at 03/16/2020 [id number] Gross per 24 hour  Intake 1135.19 ml  Output 2820 ml  Net -1684.81 ml   Filed Weights   03/13/20 1654 03/13/20 2055  Weight: 59 kg 60.3 kg    Examination:  General exam: Lethargic,  appears disheveled, chronically ill Respiratory system: Poor respiratory effort, bibasilar crackles Cardiovascular system: Tachycardic, no murmurs, no pedal edema  gastrointestinal system: Nondistended, nontender, normal bowel sounds Central nervous system: Lethargic, unable to fully assess, no obvious deficits Extremities: Unable to assess Skin: No rashes, lesions or ulcers Psychiatry: Irritable    Data Reviewed: I have personally reviewed following labs and imaging studies  CBC: Recent Labs  Lab 03/13/20 1659  WBC 32.4*  NEUTROABS 30.6*  HGB 11.9*  HCT 38.7*  MCV 100.8*  PLT 765*   Basic Metabolic Panel: Recent Labs  Lab 03/13/20 1659 03/13/20 2114 03/14/20 0148 03/14/20 0424 03/14/20 0855 03/15/20 0655 03/16/20 0443  NA 139   < > 145 147* 146* 137 132*  K 5.4*   < > 4.7 4.1 3.9 3.4* 4.0  CL 101   < > 118* 119* 118* 104 98  CO2 8*   < > 17* 21* 20* 25 24  GLUCOSE 637*   < > 165* 176* 187* 329* 445*  BUN  54*   < > 42* 41* 34* 17 22  CREATININE 1.89*   < > 0.98 0.84 0.93 0.64 0.71  CALCIUM 9.6   < > 8.7* 8.8* 8.8* 8.6* 8.4*  MG 3.1*  --   --   --   --   --   --   PHOS 6.9*  --   --   --   --   --   --    < > = values in this interval not displayed.   GFR: Estimated Creatinine Clearance: 82.7 mL/min (by C-G formula based on SCr of 0.71 mg/dL). Liver Function Tests: Recent Labs  Lab 03/13/20 1659  AST 16  ALT 18  ALKPHOS 141*  BILITOT 3.0*  PROT 7.5  ALBUMIN 2.9*   Recent Labs  Lab 03/13/20 1659  LIPASE 13   No results for input(s): AMMONIA in the last 168 hours. Coagulation Profile: No results for input(s): INR, PROTIME in the last 168 hours. Cardiac Enzymes: No results for input(s): CKTOTAL, CKMB, CKMBINDEX, TROPONINI in the last 168 hours. BNP (last 3 results) No results for input(s): PROBNP in the last 8760 hours. HbA1C: No results for input(s): HGBA1C in the last 72 hours. CBG: Recent Labs  Lab 03/15/20 0748 03/15/20 1206 03/15/20 1653  03/15/20 2109 03/16/20 0735  GLUCAP 368* 424* 280* 140* 439*   Lipid Profile: No results for input(s): CHOL, HDL, LDLCALC, TRIG, CHOLHDL, LDLDIRECT in the last 72 hours. Thyroid Function Tests: No results for input(s): TSH, T4TOTAL, FREET4, T3FREE, THYROIDAB in the last 72 hours. Anemia Panel: No results for input(s): VITAMINB12, FOLATE, FERRITIN, TIBC, IRON, RETICCTPCT in the last 72 hours. Sepsis Labs: No results for input(s): PROCALCITON, LATICACIDVEN in the last 168 hours.  Recent Results (from the past 240 hour(s))  Respiratory Panel by RT PCR (Flu A&B, Covid) - Nasopharyngeal Swab     Status: None   Collection Time: 03/13/20  7:33 PM   Specimen: Nasopharyngeal Swab  Result Value Ref Range Status   SARS Coronavirus 2 by RT PCR NEGATIVE NEGATIVE Final    Comment: (NOTE) SARS-CoV-2 target nucleic acids are NOT DETECTED. The SARS-CoV-2 RNA is generally detectable in upper respiratoy specimens during the acute phase of infection. The lowest concentration of SARS-CoV-2 viral copies this assay can detect is 131 copies/mL. A negative result does not preclude SARS-Cov-2 infection and should not be used as the sole basis for treatment or other patient management decisions. A negative result may occur with  improper specimen collection/handling, submission of specimen other than nasopharyngeal swab, presence of viral mutation(s) within the areas targeted by this assay, and inadequate number of viral copies (<131 copies/mL). A negative result must be combined with clinical observations, patient history, and epidemiological information. The expected result is Negative. Fact Sheet for Patients:  PinkCheek.be Fact Sheet for Healthcare Providers:  GravelBags.it This test is not yet ap proved or cleared by the [place] FDA and  has been authorized for detection and/or diagnosis of SARS-CoV-2 by FDA under an Emergency Use  Authorization (EUA). This EUA will remain  in effect (meaning this test can be used) for the duration of the COVID-19 declaration under Section 564(b)(1) of the Act, 21 U.S.C. section 360bbb-3(b)(1), unless the authorization is terminated or revoked sooner.    Influenza A by PCR NEGATIVE NEGATIVE Final   Influenza B by PCR NEGATIVE NEGATIVE Final    Comment: (NOTE) The Xpert Xpress SARS-CoV-2/FLU/RSV assay is intended as an aid in  the diagnosis of influenza from Nasopharyngeal  swab specimens and  should not be used as a sole basis for treatment. Nasal washings and  aspirates are unacceptable for Xpert Xpress SARS-CoV-2/FLU/RSV  testing. Fact Sheet for Patients: https://www.moore.com/ Fact Sheet for Healthcare Providers: https://www.young.biz/ This test is not yet approved or cleared by the [place] FDA and  has been authorized for detection and/or diagnosis of SARS-CoV-2 by  FDA under an Emergency Use Authorization (EUA). This EUA will remain  in effect (meaning this test can be used) for the duration of the  Covid-19 declaration under Section 564(b)(1) of the Act, 21  U.S.C. section 360bbb-3(b)(1), unless the authorization is  terminated or revoked. Performed at [facility], [address] [place], [place] [address]   Blood culture (routine x 2)     Status: Abnormal   Collection Time: 03/13/20  7:33 PM   Specimen: BLOOD  Result Value Ref Range Status   Specimen Description   Final    BLOOD LEFT HAND Performed at [facility], [address] [place], [place] [address]    Special Requests   Final    BOTTLES DRAWN AEROBIC AND ANAEROBIC Blood Culture results may not be optimal due to an inadequate volume of blood received in culture bottles Performed at [facility], [address], [place], [place] [address]    Culture  Setup Time   Final    GRAM POSITIVE COCCI IN BOTH AEROBIC AND ANAEROBIC  BOTTLES CRITICAL RESULT CALLED TO, READ BACK BY AND VERIFIED WITH: [facility] HALLAJI AT 9562 03/14/20.PMF Performed at [facility], [address] [place], [place] [address]    Culture STAPHYLOCOCCUS AUREUS (A)  Final   Report Status 03/16/2020 FINAL  Final   Organism ID, Bacteria STAPHYLOCOCCUS AUREUS  Final      Susceptibility   Staphylococcus aureus - MIC*    CIPROFLOXACIN <=0.5 SENSITIVE Sensitive     ERYTHROMYCIN >=8 RESISTANT Resistant     GENTAMICIN <=0.5 SENSITIVE Sensitive     OXACILLIN 0.5 SENSITIVE Sensitive     TETRACYCLINE <=1 SENSITIVE Sensitive     VANCOMYCIN 1 SENSITIVE Sensitive     TRIMETH/SULFA <=10 SENSITIVE Sensitive     CLINDAMYCIN RESISTANT Resistant     RIFAMPIN <=0.5 SENSITIVE Sensitive     Inducible Clindamycin POSITIVE Resistant     * STAPHYLOCOCCUS AUREUS  Blood culture (routine x 2)     Status: Abnormal   Collection Time: 03/13/20  7:33 PM   Specimen: BLOOD  Result Value Ref Range Status   Specimen Description   Final    BLOOD LEFT AC Performed at [facility], [address], [place], [place] [address]    Special Requests   Final    BOTTLES DRAWN AEROBIC AND ANAEROBIC Blood Culture results may not be optimal due to an inadequate volume of blood received in culture bottles Performed at [facility], [address] [place], [place] [address]    Culture  Setup Time   Final    GRAM POSITIVE COCCI IN BOTH AEROBIC AND ANAEROBIC BOTTLES CRITICAL RESULT CALLED TO, READ BACK BY AND VERIFIED WITH: [facility] HALLAJI AT 2841 03/14/20.PMF Performed at [facility], [address] [place], [place] [address]    Culture (A)  Final    STAPHYLOCOCCUS AUREUS SUSCEPTIBILITIES PERFORMED ON PREVIOUS CULTURE WITHIN THE LAST 5 DAYS. Performed at [facility], [address] [place], [place] [address]    Report Status 03/16/2020 FINAL  Final  Blood Culture ID Panel (Reflexed)  Status: Abnormal   Collection Time: 03/13/20  7:33  PM  Result Value Ref Range Status   Enterococcus species NOT DETECTED NOT DETECTED Final   Listeria monocytogenes NOT DETECTED NOT DETECTED Final   Staphylococcus species DETECTED (A) NOT DETECTED Final    Comment: CRITICAL RESULT CALLED TO, READ BACK BY AND VERIFIED WITH: [facility] HALLAJI AT 4496 03/14/20.PMF    Staphylococcus aureus (BCID) DETECTED (A) NOT DETECTED Final    Comment: Methicillin (oxacillin) susceptible Staphylococcus aureus (MSSA). Preferred therapy is anti staphylococcal beta lactam antibiotic (Cefazolin or Nafcillin), unless clinically contraindicated. CRITICAL RESULT CALLED TO, READ BACK BY AND VERIFIED WITH: [facility] HALLAJI AT 7591 03/14/20.PMF    Methicillin resistance NOT DETECTED NOT DETECTED Final   Streptococcus species NOT DETECTED NOT DETECTED Final   Streptococcus agalactiae NOT DETECTED NOT DETECTED Final   Streptococcus pneumoniae NOT DETECTED NOT DETECTED Final   Streptococcus pyogenes NOT DETECTED NOT DETECTED Final   Acinetobacter baumannii NOT DETECTED NOT DETECTED Final   Enterobacteriaceae species NOT DETECTED NOT DETECTED Final   Enterobacter cloacae complex NOT DETECTED NOT DETECTED Final   Escherichia coli NOT DETECTED NOT DETECTED Final   Klebsiella oxytoca NOT DETECTED NOT DETECTED Final   Klebsiella pneumoniae NOT DETECTED NOT DETECTED Final   Proteus species NOT DETECTED NOT DETECTED Final   Serratia marcescens NOT DETECTED NOT DETECTED Final   Haemophilus influenzae NOT DETECTED NOT DETECTED Final   Neisseria meningitidis NOT DETECTED NOT DETECTED Final   Pseudomonas aeruginosa NOT DETECTED NOT DETECTED Final   Candida albicans NOT DETECTED NOT DETECTED Final   Candida glabrata NOT DETECTED NOT DETECTED Final   Candida krusei NOT DETECTED NOT DETECTED Final   Candida parapsilosis NOT DETECTED NOT DETECTED Final   Candida tropicalis NOT DETECTED NOT DETECTED Final    Comment: Performed at [facility], [address] [place],  [place] [address]  Culture, blood (routine x 2)     Status: None (Preliminary result)   Collection Time: 03/15/20  6:55 AM   Specimen: BLOOD LEFT HAND  Result Value Ref Range Status   Specimen Description BLOOD LEFT HAND  Final   Special Requests   Final    BOTTLES DRAWN AEROBIC AND ANAEROBIC Blood Culture adequate volume   Culture   Final    NO GROWTH 1 DAY Performed at [facility], [address] [place], [place] [address]    Report Status PENDING  Incomplete  Culture, blood (routine x 2)     Status: None (Preliminary result)   Collection Time: 03/15/20  6:55 AM   Specimen: BLOOD LEFT FOREARM  Result Value Ref Range Status   Specimen Description BLOOD LEFT FOREARM  Final   Special Requests   Final    BOTTLES DRAWN AEROBIC AND ANAEROBIC Blood Culture adequate volume   Culture   Final    NO GROWTH 1 DAY Performed at [facility], [address], [place], [place] [address]    Report Status PENDING  Incomplete         Radiology Studies: ECHOCARDIOGRAM COMPLETE  Result Date: 03/14/2020    ECHOCARDIOGRAM REPORT   Patient Name:   Spencer [name] Date of Exam: 03/14/2020 Medical Rec #:  [id number]    Height:       70.0 in Accession #:    [id number]   Weight:       132.9 lb Date of Birth:  1961/06/26     BSA:          1.755 m  Patient Age:    59 years     BP:           129/78 mmHg Patient Gender: M            HR:           105 bpm. Exam Location:  [facility] Procedure: 2D Echo, Cardiac Doppler and Color Doppler Indications:     Bacteremia 790.7 / R78.81  History:         Patient has prior history of Echocardiogram examinations. Risk                  Factors:Hypertension and Diabetes.  Sonographer:     Neysa Bonito Roar Referring Phys:  [id number] Tresa Moore Diagnosing Phys: Arnoldo Hooker MD IMPRESSIONS  1. Left ventricular ejection fraction, by estimation, is 60 to 65%. The left ventricle has normal function. The left ventricle has no regional wall motion abnormalities. Left ventricular  diastolic parameters were normal.  2. Right ventricular systolic function is normal. The right ventricular size is normal.  3. The mitral valve is normal in structure. Trivial mitral valve regurgitation.  4. The aortic valve is normal in structure. Aortic valve regurgitation is not visualized. FINDINGS  Left Ventricle: Left ventricular ejection fraction, by estimation, is 60 to 65%. The left ventricle has normal function. The left ventricle has no regional wall motion abnormalities. The left ventricular internal cavity size was normal in size. There is  no left ventricular hypertrophy. Left ventricular diastolic parameters were normal. Right Ventricle: The right ventricular size is normal. No increase in right ventricular wall thickness. Right ventricular systolic function is normal. Left Atrium: Left atrial size was normal in size. Right Atrium: Right atrial size was normal in size. Pericardium: There is no evidence of pericardial effusion. Mitral Valve: The mitral valve is normal in structure. Trivial mitral valve regurgitation. Tricuspid Valve: The tricuspid valve is normal in structure. Tricuspid valve regurgitation is trivial. Aortic Valve: The aortic valve is normal in structure. Aortic valve regurgitation is not visualized. Aortic valve mean gradient measures 7.0 mmHg. Aortic valve peak gradient measures 11.8 mmHg. Aortic valve area, by VTI measures 2.20 cm. Pulmonic Valve: The pulmonic valve was normal in structure. Pulmonic valve regurgitation is not visualized. Aorta: The aortic root and ascending aorta are structurally normal, with no evidence of dilitation. IAS/Shunts: No atrial level shunt detected by color flow Doppler.  LEFT VENTRICLE PLAX 2D LVIDd:         4.37 cm  Diastology LVIDs:         3.26 cm  LV e' lateral:   10.30 cm/s LV PW:         1.06 cm  LV E/e' lateral: 6.0 LV IVS:        1.21 cm  LV e' medial:    7.51 cm/s LVOT diam:     2.00 cm  LV E/e' medial:  8.3 LV SV:         57 LV SV Index:    32 LVOT Area:     3.14 cm  RIGHT VENTRICLE RV Mid diam:    3.42 cm RV S prime:     18.80 cm/s TAPSE (M-mode): 2.0 cm LEFT ATRIUM             Index       RIGHT ATRIUM           Index LA diam:        3.40 cm 1.94 cm/m  RA Area:  20.50 cm LA Vol (A2C):   72.6 ml 41.37 ml/m RA Volume:   58.60 ml  33.40 ml/m LA Vol (A4C):   34.5 ml 19.66 ml/m LA Biplane Vol: 53.0 ml 30.20 ml/m  AORTIC VALVE                    PULMONIC VALVE AV Area (Vmax):    2.17 cm     PV Vmax:        1.14 m/s AV Area (Vmean):   2.04 cm     PV Peak grad:   5.2 mmHg AV Area (VTI):     2.20 cm     RVOT Peak grad: 2 mmHg AV Vmax:           172.00 cm/s AV Vmean:          120.000 cm/s AV VTI:            0.259 m AV Peak Grad:      11.8 mmHg AV Mean Grad:      7.0 mmHg LVOT Vmax:         119.00 cm/s LVOT Vmean:        77.800 cm/s LVOT VTI:          0.181 m LVOT/AV VTI ratio: 0.70  AORTA Ao Root diam: 3.30 cm MITRAL VALVE MV Area (PHT): 5.42 cm    SHUNTS MV Decel Time: 140 msec    Systemic VTI:  0.18 m MV E velocity: 62.10 cm/s  Systemic Diam: 2.00 cm MV A velocity: 85.30 cm/s MV E/A ratio:  0.73 MV A Prime:    8.2 cm/s Arnoldo Hooker MD Electronically signed by Arnoldo Hooker MD Signature Date/Time: 03/14/2020/7:45:43 PM    Final         Scheduled Meds: . amLODipine  10 mg Oral Daily  . aspirin EC  81 mg Oral Daily  . Chlorhexidine Gluconate Cloth  6 each Topical Daily  . enoxaparin (LOVENOX) injection  40 mg Subcutaneous Q24H  . insulin aspart  0-15 Units Subcutaneous TID WC  . insulin aspart  0-5 Units Subcutaneous QHS  . insulin aspart  6 Units Subcutaneous TID WC  . insulin detemir  22 Units Subcutaneous Q24H  . losartan  50 mg Oral Daily  . methocarbamol  500 mg Oral TID  . multivitamin with minerals  1 tablet Oral Daily  . Ensure Max Protein  11 oz Oral BID BM   Continuous Infusions: . sodium chloride 250 mL (03/16/20 0622)  .  ceFAZolin (ANCEF) IV 2 g (03/16/20 0624)  . famotidine (PEPCID) IV Stopped (03/15/20 2203)      LOS: 3 days    Time spent: 35 minutes    Tresa Moore, MD [facility] Pager [phone]  If 7PM-7AM, please contact night-coverage 03/16/2020, 10:49 AM

## 2020-03-16 NOTE — Consult Note (Signed)
Pharmacy Antibiotic Note  Spencer [name] is a 59 y.o. male admitted on 03/13/2020 with confusion and weakness. Patient to found to have positive blood cultures. BCx 4 of 4 GPC and BCID identified MSSA.  Pharmacy has been consulted for Cefazolin dosing.  Note: Patient's Bcx positive 4/29 for MSSA. Patient left AMA 5/4.    Plan: -Cefazolin 2g IV every 8 hours  Height: 5\' 10"  (177.8 cm) Weight: 60.3 kg (132 lb 15 oz) IBW/kg (Calculated) : 73  Temp (24hrs), Avg:98.9 F (37.2 C), Min:98.6 F (37 C), Max:99.3 F (37.4 C)  Recent Labs  Lab 03/13/20 1659 03/13/20 2114 03/14/20 0148 03/14/20 0424 03/14/20 0855 03/15/20 0655 03/16/20 0443  WBC 32.4*  --   --   --   --   --   --   CREATININE 1.89*   < > 0.98 0.84 0.93 0.64 0.71   < > = values in this interval not displayed.    Estimated Creatinine Clearance: 82.7 mL/min (by C-G formula based on SCr of 0.71 mg/dL).    No Known Allergies  Antimicrobials this admission: 5/9 Cefazolin >>   Microbiology results: 5/8 BCx:  4 of 4 GPC  5/8 BCID: MSSA  Thank you for allowing pharmacy to be a part of this patient's care.  7/8, PharmD, BCPS Clinical Pharmacist 03/16/2020 11:28 AM

## 2020-03-16 NOTE — Plan of Care (Signed)
  Problem: Nutritional: Goal: Maintenance of adequate nutrition will improve Outcome: Progressing   Problem: Clinical Measurements: Goal: Ability to maintain clinical measurements within normal limits will improve Outcome: Progressing   Problem: Education: Goal: Ability to describe self-care measures that may prevent or decrease complications (Diabetes Survival Skills Education) will improve Outcome: Not Progressing   Problem: Health Behavior/Discharge Planning: Goal: Ability to manage health-related needs will improve Outcome: Not Progressing

## 2020-03-16 NOTE — Progress Notes (Signed)
Orthopedic Tech Progress Note Patient Details:  Spencer [name] August 21, 1961 [id number] Called in order to [employer] for a ASPEN CERVICAL COLLAR Patient ID: Spencer [name], male   DOB: Apr 13, 1961, 58 y.o.   MRN: [id number]   Spencer [name] 03/16/2020, 2:09 PM

## 2020-03-16 NOTE — Progress Notes (Signed)
Patient gave permission for his roommate Lorin Picket to pick up his keys and money. Charge Nurse, Victorino Dike consulted and said that if patient was alert enough to make the decision then it was fine.

## 2020-03-16 NOTE — Progress Notes (Signed)
Inpatient Diabetes Program Recommendations  AACE/ADA: New Consensus Statement on Inpatient Glycemic Control (2015)  Target Ranges:  Prepandial:   less than 140 mg/dL      Peak postprandial:   less than 180 mg/dL (1-2 hours)      Critically ill patients:  140 - 180 mg/dL   Results for Spencer [name], Spencer [name] (MRN [id number]) as of 03/16/2020 12:57  Ref. Range 03/15/2020 07:48 03/15/2020 12:06 03/15/2020 16:53 03/15/2020 21:09  Glucose-Capillary Latest Ref Range: 70 - 99 mg/dL 109 (H)  18 units NOVOLOG +  18 units LEVEMIR  424 (H)  25 units NOVOLOG  280 (H)  14 units NOVOLOG  140 (H)   Results for Spencer [name], Spencer [name] (MRN [id number]) as of 03/16/2020 12:57  Ref. Range 03/16/2020 07:35 03/16/2020 11:52  Glucose-Capillary Latest Ref Range: 70 - 99 mg/dL 025 (H)  21 units NOVOLOG +  22 units LEVEMIR  482 (H)  25 units NOVOLOG     Home DM Meds: Lantus 18 units Daily                             Novolog 12 units TID  Current Orders: Levemir 22 units Daily                            Novolog Moderate Correction Scale/ SSI (0-15 units) TID AC + HS                            Novolog 10 units TID    Patient is very well known to the Inpatient Diabetes Team--12 admissions in 2020 and this is pt's 5th admission this year since January.  Has been counseled on past admissions about the importance of glucose control.    MD- Note Levemir increased this AM (pt got 18 units yest AM 5/10--received 22 units this AM)  Also note that Novolog Meal Coverage increased to 10 units TID this AM--increased dose given at 12pm with lunch.  Please consider:  1. If AM CBG remains elevated tomorrow AM (5/12), please increase Levemir   2. If post-meal CBGs remain elevated, please further increase Novolog Meal Coverage dose    --Will follow patient during hospitalization--  Ambrose Finland RN, MSN, CDE Diabetes Coordinator Inpatient Glycemic Control Team Team Pager: [phone] (8a-5p)

## 2020-03-16 NOTE — Progress Notes (Signed)
PHARMACIST - PHYSICIAN COMMUNICATION   CONCERNING: IV to Oral Route Change Policy  RECOMMENDATION: This patient is receiving PEPCID by the intravenous route.  Based on criteria approved by the Pharmacy and Therapeutics Committee, the intravenous medication(s) is/are being converted to the equivalent oral dose form(s).   DESCRIPTION: These criteria include:  The patient is eating (either orally or via tube) and/or has been taking other orally administered medications for a least 24 hours  The patient has no evidence of active gastrointestinal bleeding or impaired GI absorption (gastrectomy, short bowel, patient on TNA or NPO).  If you have questions about this conversion, please contact the Pharmacy Department  []   [phone] )  ( [phone] [x]   [phone] )  [facility] []   [phone] )  [facility] []   [phone] )  [facility] []   [phone] )  [facility]   Irvan Tiedt A, [facility] 03/16/2020 11:21 AM

## 2020-03-17 ENCOUNTER — Inpatient Hospital Stay: Payer: Medicaid Other

## 2020-03-17 LAB — GLUCOSE, CAPILLARY
Glucose-Capillary: 417 mg/dL — ABNORMAL HIGH (ref 70–99)
Glucose-Capillary: 307 mg/dL — ABNORMAL HIGH (ref 70–99)
Glucose-Capillary: 337 mg/dL — ABNORMAL HIGH (ref 70–99)
Glucose-Capillary: 165 mg/dL — ABNORMAL HIGH (ref 70–99)
Glucose-Capillary: 162 mg/dL — ABNORMAL HIGH (ref 70–99)
Glucose-Capillary: 92 mg/dL (ref 70–99)

## 2020-03-17 LAB — BASIC METABOLIC PANEL
Anion gap: 11 (ref 5–15)
BUN: 25 mg/dL — ABNORMAL HIGH (ref 8–23)
CO2: 24 mmol/L (ref 22–32)
Calcium: 8.3 mg/dL — ABNORMAL LOW (ref 8.9–10.3)
Chloride: 98 mmol/L (ref 98–111)
Creatinine, Ser: 0.77 mg/dL (ref 0.61–1.24)
GFR calc Af Amer: >60 mL/min (ref >60)
GFR calc non Af Amer: >60 mL/min (ref >60)
Glucose, Bld: 364 mg/dL — ABNORMAL HIGH (ref 70–99)
Potassium: 3.6 mmol/L (ref 3.5–5.1)
Sodium: 133 mmol/L — ABNORMAL LOW (ref 135–145)

## 2020-03-17 LAB — PHOSPHORUS: Phosphorus: 2.5 mg/dL (ref 2.5–4.6)

## 2020-03-17 LAB — MAGNESIUM: Magnesium: 1.8 mg/dL (ref 1.7–2.4)

## 2020-03-17 IMAGING — CT CT HEAD W/O CM
4 series · 16 of 47 positions shown, 18 images · non-contrast
Comparison: 07/20/2019 CT

CLINICAL DATA: Altered LOC

EXAM:
CT HEAD WITHOUT CONTRAST
CT CERVICAL SPINE WITHOUT CONTRAST
TECHNIQUE: Multidetector CT imaging of the head and cervical spine was
performed following the standard protocol without intravenous
contrast. Multiplanar CT image reconstructions of the cervical spine
were also generated.

[Series 2: head wo · axial · 0.40mm/px · z∈[-127,-17]mm · 7 of 30 slices shown, 9 images]
[im 4/30  brain]
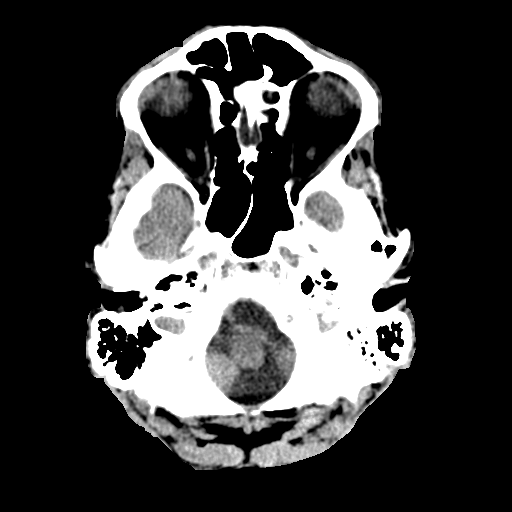
[im 4/30  bone]
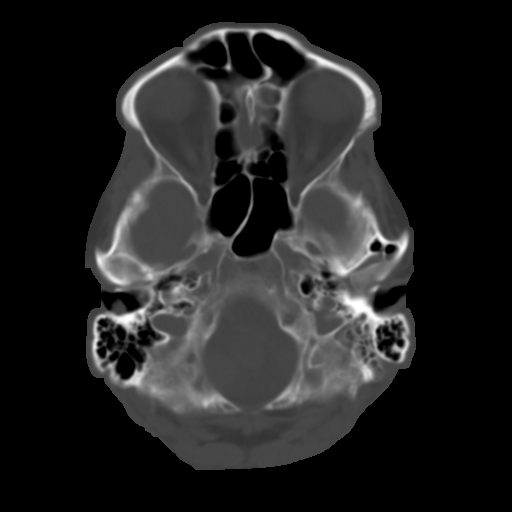
[im 8/30  brain]
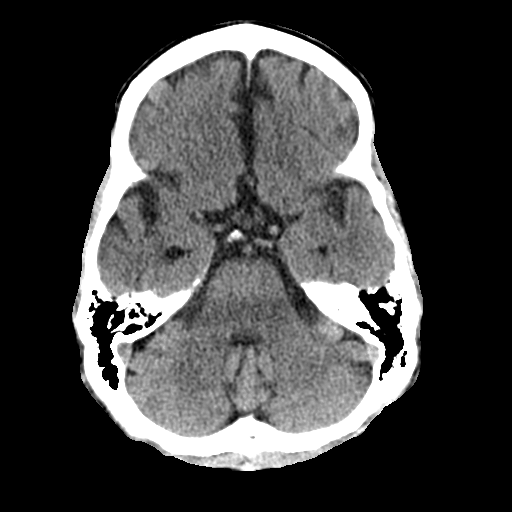
[im 11/30  brain]
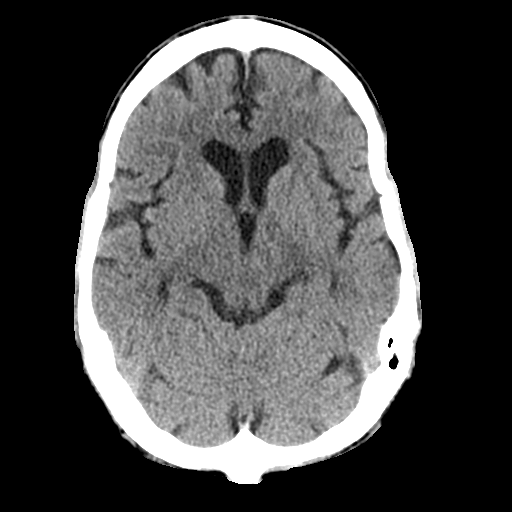
[im 15/30  brain]
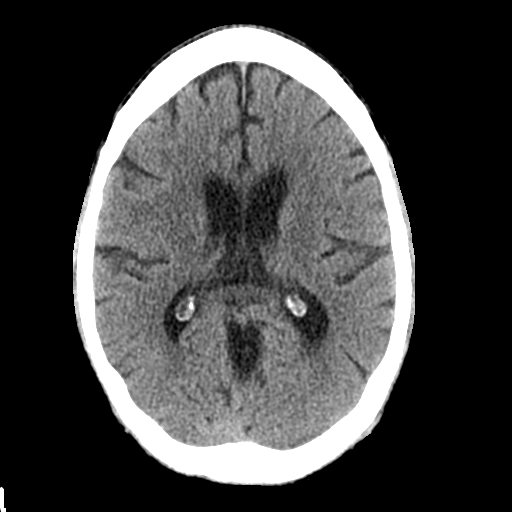
[im 19/30  brain]
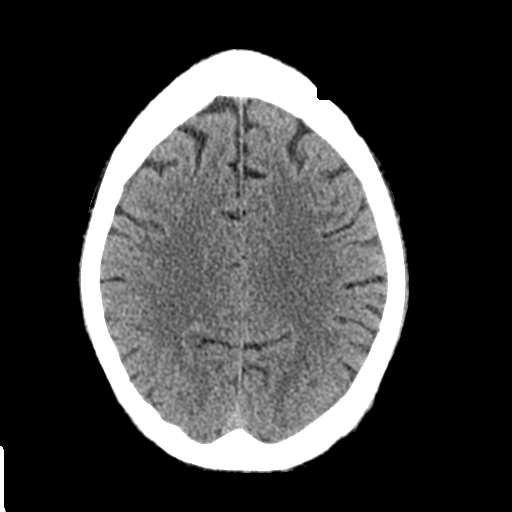
[im 19/30  bone]
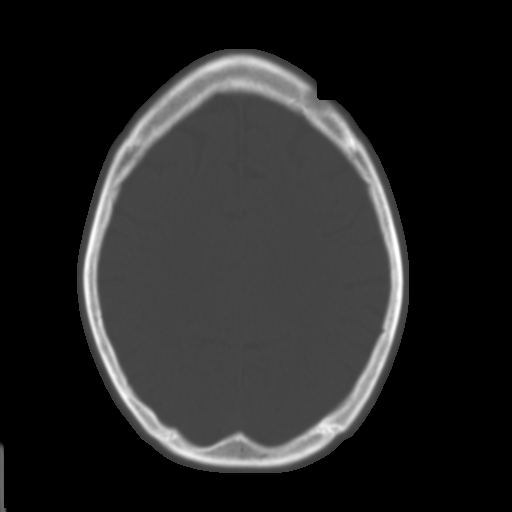
[im 22/30  brain]
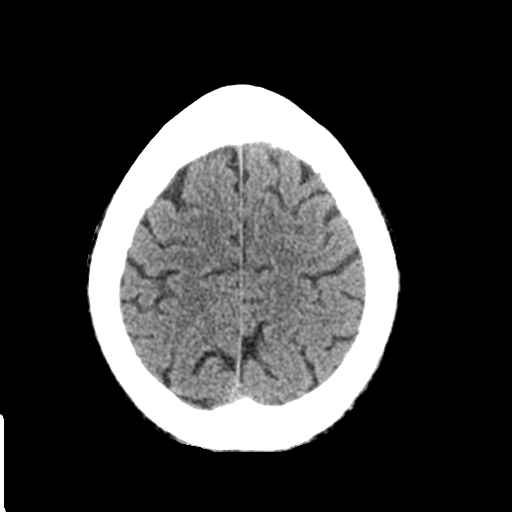
[im 26/30  brain]
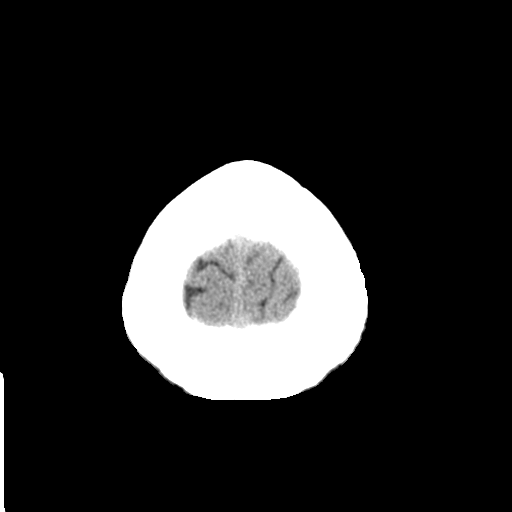

[Series 3: head bone · axial · 0.40mm/px · z∈[-128,-98]mm · 3 of 75 slices shown]
[im 8/75  bone]
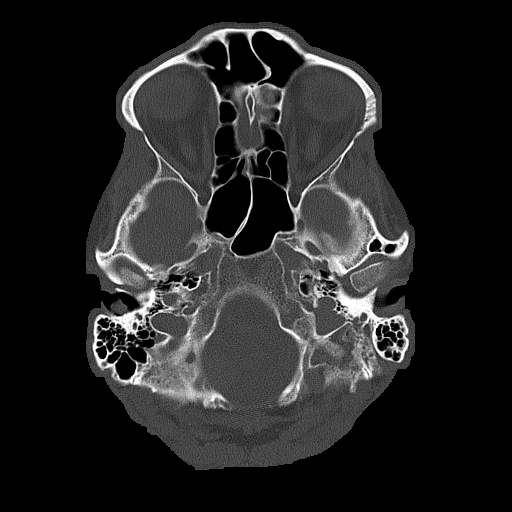
[im 15/75  bone]
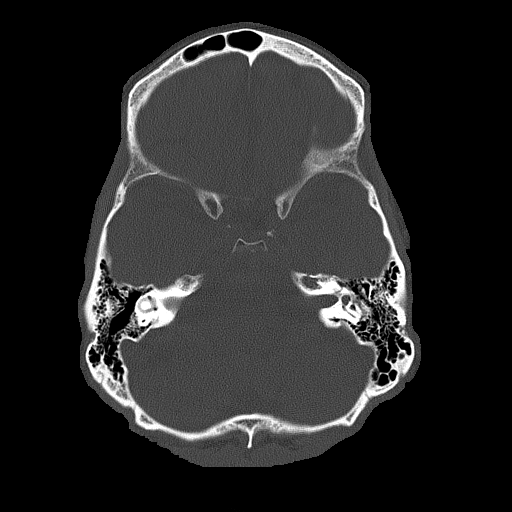
[im 23/75  bone]
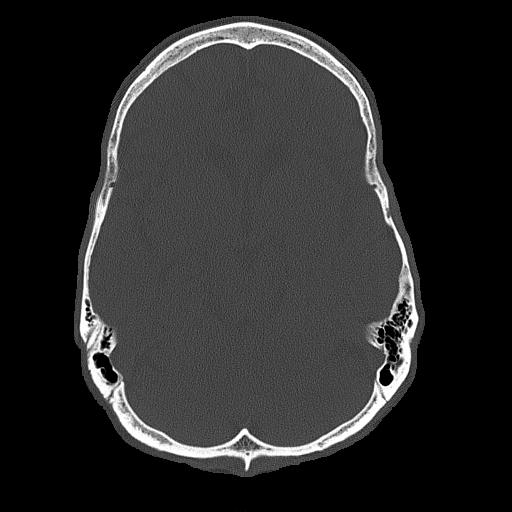

[Series 4: coronal soft tissue · coronal · 0.31mm/px · 3 of 65 slices shown]
[im 22/65  brain]
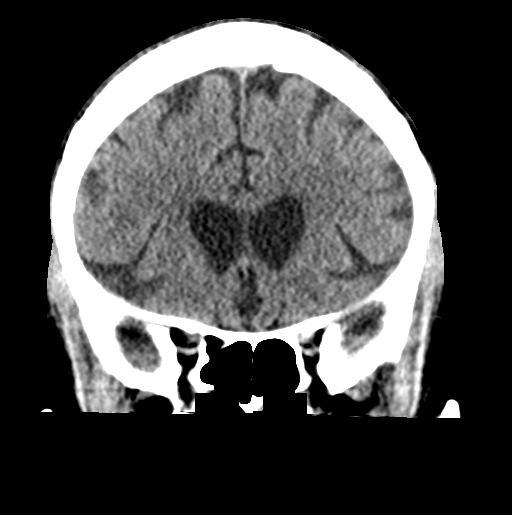
[im 29/65  brain]
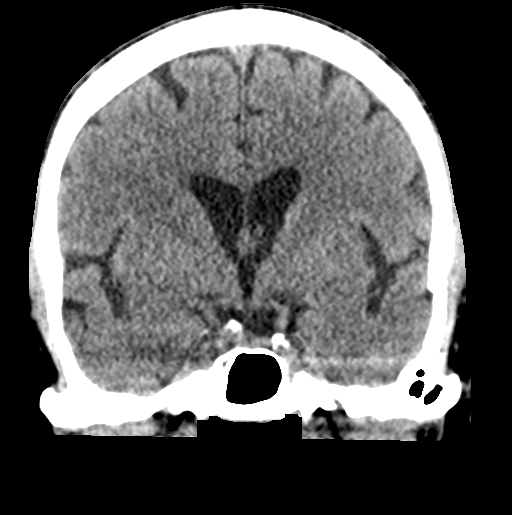
[im 36/65  brain]
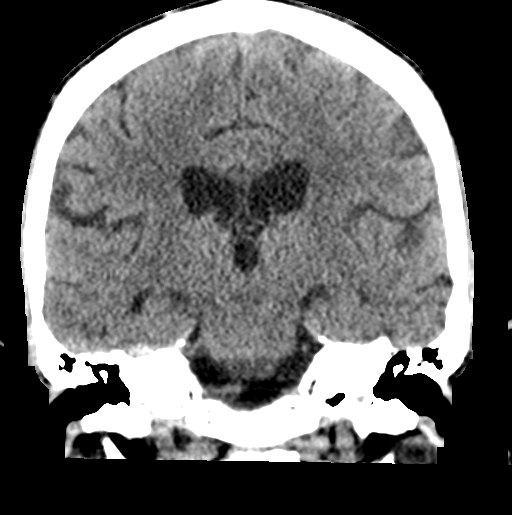

[Series 5: sagittal soft tissue · sagittal · 0.31mm/px · 3 of 48 slices shown]
[im 16/48  brain]
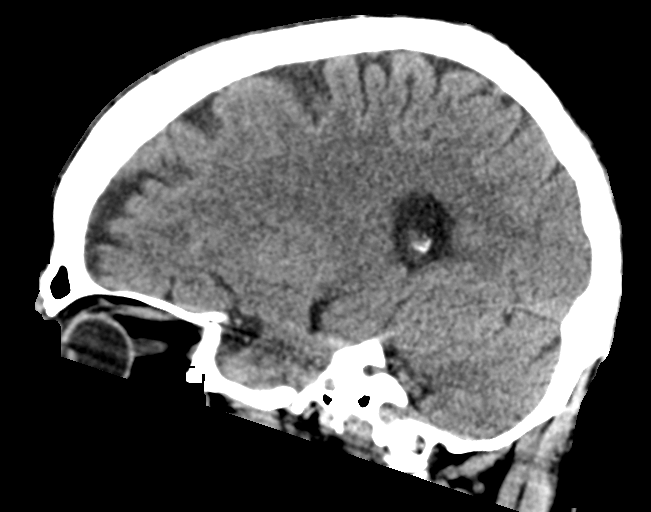
[im 24/48  brain]
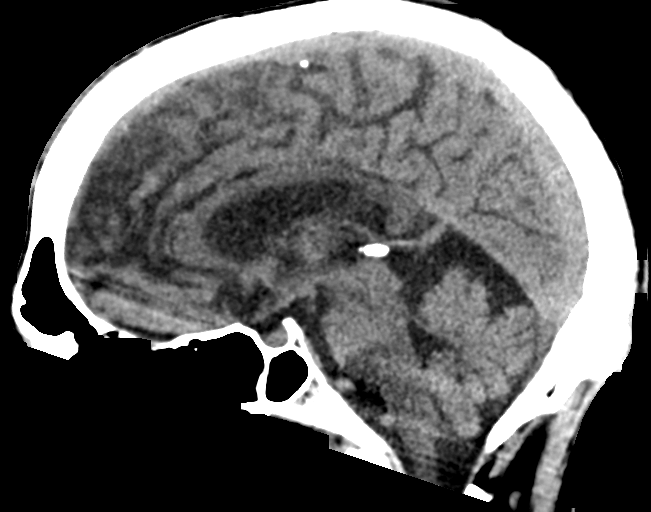
[im 32/48  brain]
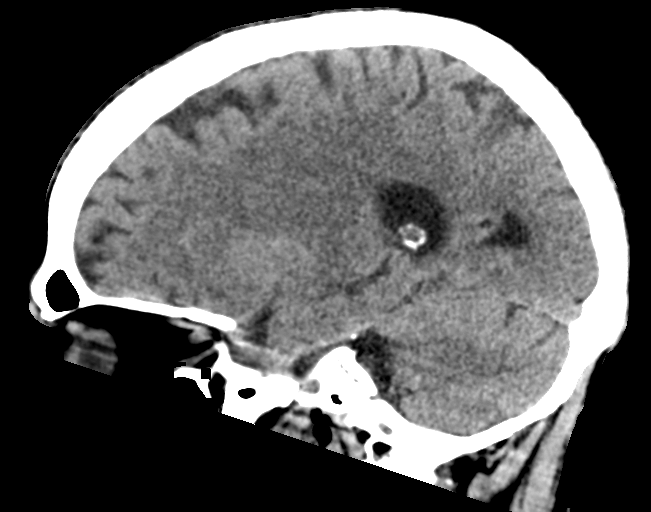

[16 of 47 positions shown; findings below may reference images not displayed]

FINDINGS: CT HEAD FINDINGS

Brain: No acute territorial infarction, hemorrhage, or intracranial
mass. Mild atrophy and small vessel ischemic changes of the white
matter. Stable ventricle size

Vascular: No hyperdense vessels.  Carotid vascular calcification

Skull: Normal. Negative for fracture or focal lesion.

Sinuses/Orbits: No acute finding.

Other: None

CT CERVICAL SPINE FINDINGS

Alignment: Straightening of the cervical spine. Facet alignment
within normal limits.

Skull base and vertebrae: No acute fracture. No primary bone lesion
or focal pathologic process.

Soft tissues and spinal canal: No prevertebral fluid or swelling. No
visible canal hematoma.

Disc levels: Post fusion changes C5 through C7. Mild degenerative
changes at C3-C4, C4-C5 and C7-T1.

Upper chest: Negative.

Other: Subcentimeter hypodensity in the left lobe of thyroid, no
change
IMPRESSION: 1. No CT evidence for acute intracranial abnormality. Mild atrophy
and small vessel ischemic changes of the white matter
2. Straightening of the cervical spine with post fusion changes C5
through C7. No acute osseous abnormality.

## 2020-03-17 MED ORDER — INSULIN ASPART 100 UNIT/ML ~~LOC~~ SOLN
15.0000 [IU] | Freq: Once | SUBCUTANEOUS | Status: AC
  Administered 2020-03-17: 15 [IU] via SUBCUTANEOUS
  Filled 2020-03-17: qty 1

## 2020-03-17 MED ORDER — GADOBUTROL 1 MMOL/ML IV SOLN
6.0000 mL | Freq: Once | INTRAVENOUS | Status: AC | PRN
  Administered 2020-03-17: 6 mL via INTRAVENOUS

## 2020-03-17 MED ORDER — POLYETHYLENE GLYCOL 3350 17 G PO PACK
17.0000 g | PACK | Freq: Every day | ORAL | Status: DC
  Administered 2020-03-17 – 2020-04-28 (×28): 17 g via ORAL
  Filled 2020-03-17 (×37): qty 1

## 2020-03-17 MED ORDER — INSULIN DETEMIR 100 UNIT/ML ~~LOC~~ SOLN
30.0000 [IU] | SUBCUTANEOUS | Status: DC
  Administered 2020-03-17 – 2020-03-20 (×4): 30 [IU] via SUBCUTANEOUS
  Filled 2020-03-17 (×5): qty 0.3

## 2020-03-17 MED ORDER — BISACODYL 5 MG PO TBEC: 5.0000 mg | DELAYED_RELEASE_TABLET | Freq: Every day | ORAL | Status: DC | PRN

## 2020-03-17 MED ORDER — SODIUM CHLORIDE 0.9 % IV SOLN
12.0000 g | INTRAVENOUS | Status: DC
  Administered 2020-03-17: 12 g via INTRAVENOUS
  Filled 2020-03-17 (×2): qty 12000

## 2020-03-17 MED ORDER — BISACODYL 10 MG RE SUPP: 10.0000 mg | Freq: Every day | RECTAL | Status: DC | PRN

## 2020-03-17 MED ORDER — BISACODYL 5 MG PO TBEC
10.0000 mg | DELAYED_RELEASE_TABLET | Freq: Once | ORAL | Status: AC
  Administered 2020-03-17: 10 mg via ORAL
  Filled 2020-03-17: qty 2

## 2020-03-17 MED ORDER — LORAZEPAM 2 MG/ML IJ SOLN
2.0000 mg | Freq: Once | INTRAMUSCULAR | Status: AC
  Administered 2020-03-17: 2 mg via INTRAVENOUS
  Filled 2020-03-17: qty 1

## 2020-03-17 NOTE — Progress Notes (Signed)
PROGRESS NOTE    MATTI MINNEY  NIO:[id number] DOB: 11/16/1960 DOA: 03/13/2020 PCP: Patient, No Pcp Per   Brief Narrative:  ELDRICK PENICK is a 59 y.o. male with medical history significant for type 2 diabetes mellitus, cocaine abuse,brain aneurysm,hypertension, GERD.  Patient was recently admitted on 03/03/20 for DKA, acute renal failure and patient signed out against medical advice on 03/10/2020.  Patient brought in by EMS.  Patient is not sure why he is here.  As per him his sister called EMS.  Patient states his blood glucose levels were very high.  When asked how compliant is he with his insulin he told me that he is taking his insulin but he does have a history of noncompliance.  He denies any chest pain, shortness of breath.  Denies any abdominal pain, nausea or any vomitings.  No fevers or chills.  Denies any diarrhea.  Does complain of constipation.  Does state that he has not been able to urinate much.    Assessment & Plan:   Principal Problem:   MSSA bacteremia Active Problems:   DKA (diabetic ketoacidoses) (HCC)   Hypertension   Diabetic neuropathy (HCC)   DM2 (diabetes mellitus, type 2) (HCC)   History of cocaine abuse (HCC)   Personal history of subdural hematoma   AKI (acute kidney injury) (HCC)   GERD (gastroesophageal reflux disease)   C4 cervical fracture (HCC)   Cigarette smoker   Pressure injury of skin    Acute diabetic ketoacidosis in a patient with type 2 diabetes mellitus, POA, resolved Acute metabolic acidosis secondary to DKA, POA, resolved -Anion gap of 30, serum bicarb level of 8 on admission -Patient was given 1 L of LR in the ED ER.   Was given another 2 L of normal saline bolus then placement 150 cc of normal saline per hour. -Was given insulin bolus, initiated on insulin drip in the ER as per the DKA protocol -Anion gap closed -Sugars improved Plan: Continue subcutaneous insulin regimen Carb modified diet CBG before meals and at bedtime Diabetes  coordinator consult Patient adherence to insulin regimen and treatment protocol is greatly in question  MSSA bacteremia Patient was noted to have MSSA bacteremia on previous admission.   At that time he left AGAINST MEDICAL ADVICE.   He was recommended for TEE for evaluation endocarditis.  However he left AMA before this could be performed.   Infectious disease has been consulted.   s/p Ancef, cardiology consult for TEE.   5/10 Repeat blood cultures NGTD.   Cards re-consulted, messaged Dr Gwen Pounds 5/9 Plan: Pending cardiology recommendations regarding TEE  5/12 started nafcillin 12 g IV daily Follow surveillance cultures, no growth to date ID following, recommended MRI C-spine and TEE after C-spine collar, psych consult as patient signed AMA to eval for capacity to make decisions Follow psych consult   Acute kidney injury secondary to dehydration, POA, improved Acute dehydration, POA -Avoid nephrotoxic agents -Monitor input and output -IV fluid hydration aggressively  Mild hyperkalemia, POA -Continue to monitor the potassium levels with IV fluid hydration  Hypertension, benign essential - Continue amlodipine - Restart losartan  Metabolic encephalopathy Likely multifactorial Lethargic but answering all questions appropriately Possibly secondary to substance abuse, DKA, MSSA bacteremia Continue to monitor mental status  C4 fracture Was evaluated by NSG previous admission Recommended Aspen collar at all times, which patient has not been compliant with Will request Aspen collar from materials Treat with Robaxin Avoid narcotics Per previous consult note by neurosurgery patient  has been cleared for TEE but must wear Aspen collar for the duration of the procedure.   DVT prophylaxis: Lovenox Code Status: Full Family Communication: None today  disposition Plan: Status is: Inpatient  Remains inpatient appropriate because:Inpatient level of care appropriate due to  severity of illness   Dispo: The patient is from: Home              Anticipated d/c is to: Home              Anticipated d/c date is: 2 days              Patient currently is not medically stable to d/c.  MSSA bacteremia on IV antibiotics.  Resolved DKA.  Pending cardiology evaluation for TEE Pending psych eval  Consultants:   ID  Cardiology  Procedures:   2D echo  Antimicrobials:   Cefazolin, 03/14/2020-   Subjective: Seen and examined, patient was sleepy, answering questions with some mumbling, hard to understand.  Still seems that patient is not very alert, stated that he is feeling tired and generalized body ache   Objective: Vitals:   03/16/20 1607 03/16/20 2257 03/17/20 0731 03/17/20 1143  BP: (!) 151/92 (!) 152/94 126/76 111/72  Pulse: 90 98 100 98  Resp: 14 18 16 16   Temp: 98.6 F (37 C) 97.8 F (36.6 C) 98.9 F (37.2 C) 98.2 F (36.8 C)  TempSrc:   Oral   SpO2: 97% 97% 94% 98%  Weight:      Height:        Intake/Output Summary (Last 24 hours) at 03/17/2020 1433 Last data filed at 03/17/2020 1300 Gross per 24 hour  Intake 600 ml  Output 2675 ml  Net -2075 ml   Filed Weights   03/13/20 1654 03/13/20 2055  Weight: 59 kg 60.3 kg    Examination:  General exam: Lethargic, appears disheveled, chronically ill Respiratory system: Poor respiratory effort, bibasilar crackles Cardiovascular system: Tachycardic, no murmurs, no pedal edema  gastrointestinal system: Nondistended, nontender, normal bowel sounds Central nervous system: Lethargic, unable to fully assess, no obvious deficits Extremities: Unable to assess Skin: No rashes, lesions or ulcers Psychiatry: Irritable    Data Reviewed: I have personally reviewed following labs and imaging studies  CBC: Recent Labs  Lab 03/13/20 1659  WBC 32.4*  NEUTROABS 30.6*  HGB 11.9*  HCT 38.7*  MCV 100.8*  PLT 938*   Basic Metabolic Panel: Recent Labs  Lab 03/13/20 1659 03/13/20 2114  03/14/20 0424 03/14/20 0855 03/15/20 0655 03/16/20 0443 03/17/20 0524 03/17/20 0624  NA 139   < > 147* 146* 137 132* 133*  --   K 5.4*   < > 4.1 3.9 3.4* 4.0 3.6  --   CL 101   < > 119* 118* 104 98 98  --   CO2 8*   < > 21* 20* 25 24 24   --   GLUCOSE 637*   < > 176* 187* 329* 445* 364*  --   BUN 54*   < > 41* 34* 17 22 25*  --   CREATININE 1.89*   < > 0.84 0.93 0.64 0.71 0.77  --   CALCIUM 9.6   < > 8.8* 8.8* 8.6* 8.4* 8.3*  --   MG 3.1*  --   --   --   --   --   --  1.8  PHOS 6.9*  --   --   --   --   --   --  2.5   < > = values in this interval not displayed.   GFR: Estimated Creatinine Clearance: 82.7 mL/min (by C-G formula based on SCr of 0.77 mg/dL). Liver Function Tests: Recent Labs  Lab 03/13/20 1659  AST 16  ALT 18  ALKPHOS 141*  BILITOT 3.0*  PROT 7.5  ALBUMIN 2.9*   Recent Labs  Lab 03/13/20 1659  LIPASE 13   No results for input(s): AMMONIA in the last 168 hours. Coagulation Profile: No results for input(s): INR, PROTIME in the last 168 hours. Cardiac Enzymes: No results for input(s): CKTOTAL, CKMB, CKMBINDEX, TROPONINI in the last 168 hours. BNP (last 3 results) No results for input(s): PROBNP in the last 8760 hours. HbA1C: No results for input(s): HGBA1C in the last 72 hours. CBG: Recent Labs  Lab 03/16/20 2126 03/17/20 0730 03/17/20 0914 03/17/20 0920 03/17/20 1130  GLUCAP 218* 417* 307* 337* 165*   Lipid Profile: No results for input(s): CHOL, HDL, LDLCALC, TRIG, CHOLHDL, LDLDIRECT in the last 72 hours. Thyroid Function Tests: No results for input(s): TSH, T4TOTAL, FREET4, T3FREE, THYROIDAB in the last 72 hours. Anemia Panel: No results for input(s): VITAMINB12, FOLATE, FERRITIN, TIBC, IRON, RETICCTPCT in the last 72 hours. Sepsis Labs: No results for input(s): PROCALCITON, LATICACIDVEN in the last 168 hours.  Recent Results (from the past 240 hour(s))  Respiratory Panel by RT PCR (Flu A&B, Covid) - Nasopharyngeal Swab     Status: None    Collection Time: 03/13/20  7:33 PM   Specimen: Nasopharyngeal Swab  Result Value Ref Range Status   SARS Coronavirus 2 by RT PCR NEGATIVE NEGATIVE Final    Comment: (NOTE) SARS-CoV-2 target nucleic acids are NOT DETECTED. The SARS-CoV-2 RNA is generally detectable in upper respiratoy specimens during the acute phase of infection. The lowest concentration of SARS-CoV-2 viral copies this assay can detect is 131 copies/mL. A negative result does not preclude SARS-Cov-2 infection and should not be used as the sole basis for treatment or other patient management decisions. A negative result may occur with  improper specimen collection/handling, submission of specimen other than nasopharyngeal swab, presence of viral mutation(s) within the areas targeted by this assay, and inadequate number of viral copies (<131 copies/mL). A negative result must be combined with clinical observations, patient history, and epidemiological information. The expected result is Negative. Fact Sheet for Patients:  https://www.moore.com/ Fact Sheet for Healthcare Providers:  https://www.young.biz/ This test is not yet ap proved or cleared by the [place] FDA and  has been authorized for detection and/or diagnosis of SARS-CoV-2 by FDA under an Emergency Use Authorization (EUA). This EUA will remain  in effect (meaning this test can be used) for the duration of the COVID-19 declaration under Section 564(b)(1) of the Act, 21 U.S.C. section 360bbb-3(b)(1), unless the authorization is terminated or revoked sooner.    Influenza A by PCR NEGATIVE NEGATIVE Final   Influenza B by PCR NEGATIVE NEGATIVE Final    Comment: (NOTE) The Xpert Xpress SARS-CoV-2/FLU/RSV assay is intended as an aid in  the diagnosis of influenza from Nasopharyngeal swab specimens and  should not be used as a sole basis for treatment. Nasal washings and  aspirates are unacceptable for Xpert Xpress  SARS-CoV-2/FLU/RSV  testing. Fact Sheet for Patients: https://www.moore.com/ Fact Sheet for Healthcare Providers: https://www.young.biz/ This test is not yet approved or cleared by the [place] FDA and  has been authorized for detection and/or diagnosis of SARS-CoV-2 by  FDA under an Emergency Use Authorization (EUA). This EUA will remain  in effect (meaning this test can be used) for the duration of the  Covid-19 declaration under Section 564(b)(1) of the Act, 21  U.S.C. section 360bbb-3(b)(1), unless the authorization is  terminated or revoked. Performed at [facility], [address] [place], [place] [address]   Blood culture (routine x 2)     Status: Abnormal   Collection Time: 03/13/20  7:33 PM   Specimen: BLOOD  Result Value Ref Range Status   Specimen Description   Final    BLOOD LEFT HAND Performed at [facility], [address] [place], [place] [address]    Special Requests   Final    BOTTLES DRAWN AEROBIC AND ANAEROBIC Blood Culture results may not be optimal due to an inadequate volume of blood received in culture bottles Performed at [facility], [address], [place], [place] [address]    Culture  Setup Time   Final    GRAM POSITIVE COCCI IN BOTH AEROBIC AND ANAEROBIC BOTTLES CRITICAL RESULT CALLED TO, READ BACK BY AND VERIFIED WITH: [facility] HALLAJI AT 0174 03/14/20.PMF Performed at [facility], [address], [place], [place] [address]    Culture STAPHYLOCOCCUS AUREUS (A)  Final   Report Status 03/16/2020 FINAL  Final   Organism ID, Bacteria STAPHYLOCOCCUS AUREUS  Final      Susceptibility   Staphylococcus aureus - MIC*    CIPROFLOXACIN <=0.5 SENSITIVE Sensitive     ERYTHROMYCIN >=8 RESISTANT Resistant     GENTAMICIN <=0.5 SENSITIVE Sensitive     OXACILLIN 0.5 SENSITIVE Sensitive     TETRACYCLINE <=1 SENSITIVE Sensitive     VANCOMYCIN 1 SENSITIVE Sensitive     TRIMETH/SULFA  <=10 SENSITIVE Sensitive     CLINDAMYCIN RESISTANT Resistant     RIFAMPIN <=0.5 SENSITIVE Sensitive     Inducible Clindamycin POSITIVE Resistant     * STAPHYLOCOCCUS AUREUS  Blood culture (routine x 2)     Status: Abnormal   Collection Time: 03/13/20  7:33 PM   Specimen: BLOOD  Result Value Ref Range Status   Specimen Description   Final    BLOOD LEFT AC Performed at [facility], [address] [place], [place] [address]    Special Requests   Final    BOTTLES DRAWN AEROBIC AND ANAEROBIC Blood Culture results may not be optimal due to an inadequate volume of blood received in culture bottles Performed at [facility], [address] [place], [place] [address]    Culture  Setup Time   Final    GRAM POSITIVE COCCI IN BOTH AEROBIC AND ANAEROBIC BOTTLES CRITICAL RESULT CALLED TO, READ BACK BY AND VERIFIED WITH: [facility] HALLAJI AT 5993 03/14/20.PMF Performed at [facility], [address] [place], [place] [address]    Culture (A)  Final    STAPHYLOCOCCUS AUREUS SUSCEPTIBILITIES PERFORMED ON PREVIOUS CULTURE WITHIN THE LAST 5 DAYS. Performed at [facility], [address] [place], [place] [address]    Report Status 03/16/2020 FINAL  Final  Blood Culture ID Panel (Reflexed)     Status: Abnormal   Collection Time: 03/13/20  7:33 PM  Result Value Ref Range Status   Enterococcus species NOT DETECTED NOT DETECTED Final   Listeria monocytogenes NOT DETECTED NOT DETECTED Final   Staphylococcus species DETECTED (A) NOT DETECTED Final    Comment: CRITICAL RESULT CALLED TO, READ BACK BY AND VERIFIED WITH: [facility] HALLAJI AT 3009 03/14/20.PMF    Staphylococcus aureus (BCID) DETECTED (A) NOT DETECTED Final  Comment: Methicillin (oxacillin) susceptible Staphylococcus aureus (MSSA). Preferred therapy is anti staphylococcal beta lactam antibiotic (Cefazolin or Nafcillin), unless clinically contraindicated. CRITICAL RESULT CALLED TO, READ BACK BY AND VERIFIED  WITH: [facility] HALLAJI AT 4098 03/14/20.PMF    Methicillin resistance NOT DETECTED NOT DETECTED Final   Streptococcus species NOT DETECTED NOT DETECTED Final   Streptococcus agalactiae NOT DETECTED NOT DETECTED Final   Streptococcus pneumoniae NOT DETECTED NOT DETECTED Final   Streptococcus pyogenes NOT DETECTED NOT DETECTED Final   Acinetobacter baumannii NOT DETECTED NOT DETECTED Final   Enterobacteriaceae species NOT DETECTED NOT DETECTED Final   Enterobacter cloacae complex NOT DETECTED NOT DETECTED Final   Escherichia coli NOT DETECTED NOT DETECTED Final   Klebsiella oxytoca NOT DETECTED NOT DETECTED Final   Klebsiella pneumoniae NOT DETECTED NOT DETECTED Final   Proteus species NOT DETECTED NOT DETECTED Final   Serratia marcescens NOT DETECTED NOT DETECTED Final   Haemophilus influenzae NOT DETECTED NOT DETECTED Final   Neisseria meningitidis NOT DETECTED NOT DETECTED Final   Pseudomonas aeruginosa NOT DETECTED NOT DETECTED Final   Candida albicans NOT DETECTED NOT DETECTED Final   Candida glabrata NOT DETECTED NOT DETECTED Final   Candida krusei NOT DETECTED NOT DETECTED Final   Candida parapsilosis NOT DETECTED NOT DETECTED Final   Candida tropicalis NOT DETECTED NOT DETECTED Final    Comment: Performed at [facility], [address] [place], [place] [address]  Culture, blood (routine x 2)     Status: None (Preliminary result)   Collection Time: 03/15/20  6:55 AM   Specimen: BLOOD LEFT HAND  Result Value Ref Range Status   Specimen Description BLOOD LEFT HAND  Final   Special Requests   Final    BOTTLES DRAWN AEROBIC AND ANAEROBIC Blood Culture adequate volume   Culture   Final    NO GROWTH 2 DAYS Performed at [facility], [address] [place], [place] [address]    Report Status PENDING  Incomplete  Culture, blood (routine x 2)     Status: None (Preliminary result)   Collection Time: 03/15/20  6:55 AM   Specimen: BLOOD LEFT FOREARM  Result Value  Ref Range Status   Specimen Description BLOOD LEFT FOREARM  Final   Special Requests   Final    BOTTLES DRAWN AEROBIC AND ANAEROBIC Blood Culture adequate volume   Culture   Final    NO GROWTH 2 DAYS Performed at [facility], [address] [place], [place] [address]    Report Status PENDING  Incomplete         Radiology Studies: No results found.      Scheduled Meds: . amLODipine  10 mg Oral Daily  . aspirin EC  81 mg Oral Daily  . bisacodyl  10 mg Oral Once  . Chlorhexidine Gluconate Cloth  6 each Topical Daily  . enoxaparin (LOVENOX) injection  40 mg Subcutaneous Q24H  . famotidine  20 mg Oral Daily  . insulin aspart  0-15 Units Subcutaneous TID WC  . insulin aspart  0-5 Units Subcutaneous QHS  . insulin aspart  10 Units Subcutaneous TID WC  . insulin detemir  30 Units Subcutaneous Q24H  . losartan  50 mg Oral Daily  . methocarbamol  500 mg Oral TID  . multivitamin with minerals  1 tablet Oral Daily  . polyethylene glycol  17 g Oral Daily  . Ensure Max Protein  11 oz Oral BID BM   Continuous Infusions: . sodium chloride 250 mL (  03/16/20 0622)  . nafcillin (NAFCIL) continuous infusion 12 g (03/17/20 1300)     LOS: 4 days    Time spent: 35 minutes    Gillis Santaileep Danni Leabo, MD [facility] Pager [phone]  If 7PM-7AM, please contact night-coverage 03/17/2020, 2:33 PM

## 2020-03-17 NOTE — Consult Note (Signed)
NAME: Spencer [name]  DOB: 09-13-1961  MRN: [id number]  Date/Time: 03/17/2020 9:34 AM   Late entry- pt seen on 03/16/20  REQUESTING PROVIDER: sreenath Subjective:  REASON FOR CONSULT: MSSA bacteremia Patient is a poor historian.  Chart reviewed  ? Spencer [name] is a 59 y.o. male with a history of diabetes mellitus with recurrent DKA, hypertension, cocaine use, MSSA bacteremia was recently in the hospital between 03/03/2020 until 03/10/2020 and left AMA.    He came back to the ED on 03/13/2020  brought by EMS .  As per EMS report they were called to a boardinghouse and found the patient on the floor.  The people around him told EMS that he did not live there.  When the sugar was checked it was very high.  Patient also had urinated upon himself. Patient has had multiple hospitalizations since April 2021.  He was in the hospital between 02/13/2020 until 02/23/2020 for DKA, AKI, cocaine intoxication metabolic encephalopathy and respiratory failure and required intubation for a short.. After discharge he was again in the hospital between 4/28 until 03/10/2020 when he left AMA.  The last hospitalization was for a fall and he was found to have fracture of the anterior margin of the C4 vertebral body with minimal displacement.  There was also pre vertebral fluid seen at the site of the new fracture.  He was seen by neurosurgery and was advised in Aspen collar.  During that hospitalization he also was found to have staph aureus bacteremia from blood cultures on 03/04/2020.  Repeat blood culture on 03/06/2020 was negative he was treated with IV cefazolin.  2D echo was done on 03/05/2020 and the valves looked okay.  TEE was being scheduled but he had to get a clearance from neurosurgery to undergo the procedure and they had cleared him to have it with Aspen collar.  But patient left AMA.  In the ED his vitals where temperature of 97.5 BP of 116/80.  His T-max has been 101.  Blood cultures were sent and it is positive for staph  aureus again.  And I am asked to see the patient.  During his presentation his blood glucose was 637 with an anion gap of 30 and he was treated for DKA again. Patient is not cooperative and does not want to answer questions.  He says his neck hurts    Past Medical History:  Diagnosis Date  . Brain aneurysm   . Broken bones    clavical, ankle, arms toes wriast   . Diabetes mellitus without complication (HCC)   . Dysrhythmia   . GERD (gastroesophageal reflux disease)   . Hypertension   . Substance abuse [facility])     Past Surgical History:  Procedure Laterality Date  . CORONARY/GRAFT ACUTE MI REVASCULARIZATION N/A 09/04/2019   Procedure: Coronary/Graft Acute MI Revascularization;  Surgeon: Alwyn Pea, MD;  Location: [facility] INVASIVE CV LAB;  Service: Cardiovascular;  Laterality: N/A;  . ESOPHAGOGASTRODUODENOSCOPY (EGD) WITH PROPOFOL N/A 06/04/2019   Procedure: ESOPHAGOGASTRODUODENOSCOPY (EGD) WITH PROPOFOL;  Surgeon: Toledo, Boykin Nearing, MD;  Location: [facility] ENDOSCOPY;  Service: Gastroenterology;  Laterality: N/A;  . HERNIA REPAIR    . IMPLANTATION / PLACEMENT OF STRIP ELECTRODES VIA BURR HOLES SUBDURAL     anyrusum  . LEFT HEART CATH AND CORONARY ANGIOGRAPHY N/A 09/04/2019   Procedure: LEFT HEART CATH AND CORONARY ANGIOGRAPHY;  Surgeon: Alwyn Pea, MD;  Location: [facility] INVASIVE CV LAB;  Service: Cardiovascular;  Laterality: N/A;  . NECK SURGERY    .  TEE WITHOUT CARDIOVERSION N/A 06/03/2019   Procedure: TRANSESOPHAGEAL ECHOCARDIOGRAM (TEE);  Surgeon: Dalia Heading, MD;  Location: [facility] ORS;  Service: Cardiovascular;  Laterality: N/A;    Social History   Socioeconomic History  . Marital status: Single    Spouse name: Not on file  . Number of children: Not on file  . Years of education: Not on file  . Highest education level: Not on file  Occupational History  . Not on file  Tobacco Use  . Smoking status: Current Some Day Smoker  . Smokeless tobacco: Never Used  Substance  and Sexual Activity  . Alcohol use: Yes    Alcohol/week: 1.0 standard drinks    Types: 1 Cans of beer per week  . Drug use: Not Currently    Types: Cocaine  . Sexual activity: Not on file  Other Topics Concern  . Not on file  Social History Narrative   ** Merged History Encounter **       Social Determinants of Health   Financial Resource Strain:   . Difficulty of Paying Living Expenses:   Food Insecurity:   . Worried About Programme researcher, broadcasting/film/video in the Last Year:   . Barista in the Last Year:   Transportation Needs:   . Freight forwarder (Medical):   Marland Kitchen Lack of Transportation (Non-Medical):   Physical Activity:   . Days of Exercise per Week:   . Minutes of Exercise per Session:   Stress:   . Feeling of Stress :   Social Connections:   . Frequency of Communication with Friends and Family:   . Frequency of Social Gatherings with Friends and Family:   . Attends Religious Services:   . Active Member of Clubs or Organizations:   . Attends Banker Meetings:   Marland Kitchen Marital Status:   Intimate Partner Violence:   . Fear of Current or Ex-Partner:   . Emotionally Abused:   Marland Kitchen Physically Abused:   . Sexually Abused:     Family History  Problem Relation Age of Onset  . CAD Sister   . Hypertension Sister   . Healthy Mother   . Healthy Father    No Known Allergies  ? Current Facility-Administered Medications  Medication Dose Route Frequency Provider Last Rate Last Admin  . 0.9 %  sodium chloride infusion   Intravenous PRN Lolita Patella B, MD 10 mL/hr at 03/16/20 0622 250 mL at 03/16/20 0622  . acetaminophen (TYLENOL) tablet 650 mg  650 mg Oral Q6H PRN Lolita Patella B, MD   650 mg at 03/16/20 0346  . amLODipine (NORVASC) tablet 10 mg  10 mg Oral Daily Rawla, Claudie Revering, MD   10 mg at 03/16/20 0755  . aspirin EC tablet 81 mg  81 mg Oral Daily Rawla, Prashanth, MD   81 mg at 03/16/20 0754  . ceFAZolin (ANCEF) IVPB 2g/100 mL premix  2 g Intravenous Q8H  Hallaji, Sheema M, RPH 200 mL/hr at 03/17/20 0544 2 g at 03/17/20 0544  . Chlorhexidine Gluconate Cloth 2 % PADS 6 each  6 each Topical Daily Lolita Patella B, MD   6 each at 03/14/20 1050  . dextrose 50 % solution 0-50 mL  0-50 mL Intravenous PRN Rawla, Prashanth, MD      . enoxaparin (LOVENOX) injection 40 mg  40 mg Subcutaneous Q24H Rawla, Prashanth, MD   40 mg at 03/16/20 2141  . famotidine (PEPCID) tablet 20 mg  20 mg Oral Daily Georgeann Oppenheim, [employer]  B, MD   20 mg at 03/16/20 2141  . insulin aspart (novoLOG) injection 0-15 Units  0-15 Units Subcutaneous TID WC Lolita Patella B, MD   15 Units at 03/16/20 1852  . insulin aspart (novoLOG) injection 0-5 Units  0-5 Units Subcutaneous QHS Lolita Patella B, MD   2 Units at 03/16/20 2142  . insulin aspart (novoLOG) injection 10 Units  10 Units Subcutaneous TID WC Lolita Patella B, MD   10 Units at 03/16/20 1853  . insulin detemir (LEVEMIR) injection 30 Units  30 Units Subcutaneous Q24H Gillis Santa, MD      . losartan (COZAAR) tablet 50 mg  50 mg Oral Daily Lolita Patella B, MD   50 mg at 03/16/20 0755  . methocarbamol (ROBAXIN) tablet 500 mg  500 mg Oral TID Lolita Patella B, MD   500 mg at 03/16/20 2141  . multivitamin with minerals tablet 1 tablet  1 tablet Oral Daily Lolita Patella B, MD   1 tablet at 03/16/20 0755  . protein supplement (ENSURE MAX) liquid  11 oz Oral BID BM Lolita Patella B, MD   11 oz at 03/16/20 1852     Abtx:  Anti-infectives (From admission, onward)   Start     Dose/Rate Route Frequency Ordered Stop   03/14/20 0830  ceFAZolin (ANCEF) IVPB 1 g/50 mL premix  Status:  Discontinued     1 g 100 mL/hr over 30 Minutes Intravenous Every 8 hours 03/14/20 0823 03/14/20 0823   03/14/20 0830  ceFAZolin (ANCEF) IVPB 2g/100 mL premix     2 g 200 mL/hr over 30 Minutes Intravenous Every 8 hours 03/14/20 0823     03/13/20 2115  ceFEPIme (MAXIPIME) 2 g in sodium chloride 0.9 % 100 mL IVPB     2 g 200 mL/hr over 30  Minutes Intravenous  Once 03/13/20 2114 03/14/20 1610      REVIEW OF SYSTEMS:  Patient is very irritable, rude but alert.  Lying with eyes closed he does not want to answer any questions. Objective:  VITALS:  BP 126/76 (BP Location: Right Arm)   Pulse 100   Temp 98.9 F (37.2 C) (Oral)   Resp 16   Ht  (1.778 m)   Wt 60.3 kg   SpO2 94%   BMI 19.07 kg/m  PHYSICAL EXAM: Limited because patient is not cooperative General: Awake and lying with his eyes closed.  Irritable and states that he has pain in his neck and they are asking the same questions Head: Normocephalic, without obvious abnormality, atraumatic. Eyes: Did not examine ENT he opened his mouth and poor dentition was noted Neck: Did not check because of the fracture.  He was not wearing an Aspen collar as well . Back: Did not examine Lungs: Bilateral air entry Heart: S1-S2 Abdomen: Soft, non-tender,not distended. Bowel sounds normal. No masses Extremities: atraumatic, no cyanosis. No edema. No clubbing Skin: No obvious rash or lesions Lymph: Cervical, supraclavicular normal. Neurologic: Grossly non-focal Pertinent Labs Lab Results CBC    Component Value Date/Time   WBC 32.4 (H) 03/13/2020 1659   RBC 3.84 (L) 03/13/2020 1659   HGB 11.9 (L) 03/13/2020 1659   HCT 38.7 (L) 03/13/2020 1659   PLT 765 (H) 03/13/2020 1659   MCV 100.8 (H) 03/13/2020 1659   MCH 31.0 03/13/2020 1659   MCHC 30.7 03/13/2020 1659   RDW 14.3 03/13/2020 1659   LYMPHSABS 0.7 03/13/2020 1659   MONOABS 0.6 03/13/2020 1659   EOSABS 0.0 03/13/2020 1659  BASOSABS 0.1 03/13/2020 1659    CMP Latest Ref Rng & Units 03/17/2020 03/16/2020 03/15/2020  Glucose 70 - 99 mg/dL 309(M) 076(K) 088(P)  BUN 8 - 23 mg/dL 10(R) 22 17  Creatinine 0.61 - 1.24 mg/dL 1.59 4.58 5.92  Sodium 135 - 145 mmol/L 133(L) 132(L) 137  Potassium 3.5 - 5.1 mmol/L 3.6 4.0 3.4(L)  Chloride 98 - 111 mmol/L 98 98 104  CO2 22 - 32 mmol/L 24 24 25   Calcium 8.9 - 10.3 mg/dL  8.3(L) 8.4(L) 8.6(L)  Total Protein 6.5 - 8.1 g/dL - - -  Total Bilirubin 0.3 - 1.2 mg/dL - - -  Alkaline Phos 38 - 126 U/L - - -  AST 15 - 41 U/L - - -  ALT 0 - 44 U/L - - -      Microbiology: Recent Results (from the past 240 hour(s))  Respiratory Panel by RT PCR (Flu A&B, Covid) - Nasopharyngeal Swab     Status: None   Collection Time: 03/13/20  7:33 PM   Specimen: Nasopharyngeal Swab  Result Value Ref Range Status   SARS Coronavirus 2 by RT PCR NEGATIVE NEGATIVE Final    Comment: (NOTE) SARS-CoV-2 target nucleic acids are NOT DETECTED. The SARS-CoV-2 RNA is generally detectable in upper respiratoy specimens during the acute phase of infection. The lowest concentration of SARS-CoV-2 viral copies this assay can detect is 131 copies/mL. A negative result does not preclude SARS-Cov-2 infection and should not be used as the sole basis for treatment or other patient management decisions. A negative result may occur with  improper specimen collection/handling, submission of specimen other than nasopharyngeal swab, presence of viral mutation(s) within the areas targeted by this assay, and inadequate number of viral copies (<131 copies/mL). A negative result must be combined with clinical observations, patient history, and epidemiological information. The expected result is Negative. Fact Sheet for Patients:  05/13/20 Fact Sheet for Healthcare Providers:  https://www.moore.com/ This test is not yet ap proved or cleared by the https://www.young.biz/ FDA and  has been authorized for detection and/or diagnosis of SARS-CoV-2 by FDA under an Emergency Use Authorization (EUA). This EUA will remain  in effect (meaning this test can be used) for the duration of the COVID-19 declaration under Section 564(b)(1) of the Act, 21 U.S.C. section 360bbb-3(b)(1), unless the authorization is terminated or revoked sooner.    Influenza A by PCR NEGATIVE  NEGATIVE Final   Influenza B by PCR NEGATIVE NEGATIVE Final    Comment: (NOTE) The Xpert Xpress SARS-CoV-2/FLU/RSV assay is intended as an aid in  the diagnosis of influenza from Nasopharyngeal swab specimens and  should not be used as a sole basis for treatment. Nasal washings and  aspirates are unacceptable for Xpert Xpress SARS-CoV-2/FLU/RSV  testing. Fact Sheet for Patients: [place] Fact Sheet for Healthcare Providers: https://www.moore.com/ This test is not yet approved or cleared by the https://www.young.biz/ FDA and  has been authorized for detection and/or diagnosis of SARS-CoV-2 by  FDA under an Emergency Use Authorization (EUA). This EUA will remain  in effect (meaning this test can be used) for the duration of the  Covid-19 declaration under Section 564(b)(1) of the Act, 21  U.S.C. section 360bbb-3(b)(1), unless the authorization is  terminated or revoked. Performed at [facility], [address] [place], [place]   Blood culture (routine x 2)     Status: Abnormal   Collection Time: 03/13/20  7:33 PM   Specimen: BLOOD  Result Value Ref Range Status  Specimen Description   Final    BLOOD LEFT HAND Performed at [facility], [address] [place], [place] [address]    Special Requests   Final    BOTTLES DRAWN AEROBIC AND ANAEROBIC Blood Culture results may not be optimal due to an inadequate volume of blood received in culture bottles Performed at [facility], [address] [place], [place] [address]    Culture  Setup Time   Final    GRAM POSITIVE COCCI IN BOTH AEROBIC AND ANAEROBIC BOTTLES CRITICAL RESULT CALLED TO, READ BACK BY AND VERIFIED WITH: [facility] AT [id number] 03/14/20.PMF Performed at [facility], [address] [place], [place] [address]    Culture STAPHYLOCOCCUS AUREUS (A)  Final   Report Status 03/16/2020 FINAL  Final   Organism ID, Bacteria STAPHYLOCOCCUS  AUREUS  Final      Susceptibility   Staphylococcus aureus - MIC*    CIPROFLOXACIN <=0.5 SENSITIVE Sensitive     ERYTHROMYCIN >=8 RESISTANT Resistant     GENTAMICIN <=0.5 SENSITIVE Sensitive     OXACILLIN 0.5 SENSITIVE Sensitive     TETRACYCLINE <=1 SENSITIVE Sensitive     VANCOMYCIN 1 SENSITIVE Sensitive     TRIMETH/SULFA <=10 SENSITIVE Sensitive     CLINDAMYCIN RESISTANT Resistant     RIFAMPIN <=0.5 SENSITIVE Sensitive     Inducible Clindamycin POSITIVE Resistant     * STAPHYLOCOCCUS AUREUS  Blood culture (routine x 2)     Status: Abnormal   Collection Time: 03/13/20  7:33 PM   Specimen: BLOOD  Result Value Ref Range Status   Specimen Description   Final    BLOOD LEFT AC Performed at [facility], [address] [place], [place] [address]    Special Requests   Final    BOTTLES DRAWN AEROBIC AND ANAEROBIC Blood Culture results may not be optimal due to an inadequate volume of blood received in culture bottles Performed at [facility], [address] [place], [place] [address]    Culture  Setup Time   Final    GRAM POSITIVE COCCI IN BOTH AEROBIC AND ANAEROBIC BOTTLES CRITICAL RESULT CALLED TO, READ BACK BY AND VERIFIED WITH: [facility] HALLAJ[facility] AT [id number] 03/14/20.PMF Performed at [facility], [address] [place], [place] [address]    Culture (A)  Final    STAPHYLOCOCCUS AUREUS SUSCEPTIBILITIES PERFORMED ON PREVIOUS CULTURE WITHIN THE LAST 5 DAYS. Performed at [facility], [address] [place], [place] [address]    Report Status 03/16/2020 FINAL  Final  Blood Culture ID Panel (Reflexed)     Status: Abnormal   Collection Time: 03/13/20  7:33 PM  Result Value Ref Range Status   Enterococcus species NOT DETECTED NOT DETECTED Final   Listeria monocytogenes NOT DETECTED NOT DETECTED Final   Staphylococcus species DETECTED (A) NOT DETECTED Final    Comment: CRITICAL RESULT CALLED TO, READ BACK BY AND VERIFIED WITH: [facility] AT [phone]  03/14/20.PMF    Staphylococcus aureus (BCID) DETECTED (A) NOT DETECTED Final    Comment: Methicillin (oxacillin) susceptible Staphylococcus aureus (MSSA). Preferred therapy is anti staphylococcal beta lactam antibiotic (Cefazolin or Nafcillin), unless clinically contraindicated. CRITICAL RESULT CALLED TO, READ BACK BY AND VERIFIED WITH: [facility] HALLAJ[facility] AT [id number] 03/14/20.PMF    Methicillin resistance NOT DETECTED NOT DETECTED Final   Streptococcus species NOT DETECTED NOT DETECTED Final   Streptococcus agalactiae NOT DETECTED NOT DETECTED Final   Streptococcus pneumoniae NOT DETECTED NOT DETECTED Final   Streptococcus  pyogenes NOT DETECTED NOT DETECTED Final   Acinetobacter baumannii NOT DETECTED NOT DETECTED Final   Enterobacteriaceae species NOT DETECTED NOT DETECTED Final   Enterobacter cloacae complex NOT DETECTED NOT DETECTED Final   Escherichia coli NOT DETECTED NOT DETECTED Final   Klebsiella oxytoca NOT DETECTED NOT DETECTED Final   Klebsiella pneumoniae NOT DETECTED NOT DETECTED Final   Proteus species NOT DETECTED NOT DETECTED Final   Serratia marcescens NOT DETECTED NOT DETECTED Final   Haemophilus influenzae NOT DETECTED NOT DETECTED Final   Neisseria meningitidis NOT DETECTED NOT DETECTED Final   Pseudomonas aeruginosa NOT DETECTED NOT DETECTED Final   Candida albicans NOT DETECTED NOT DETECTED Final   Candida glabrata NOT DETECTED NOT DETECTED Final   Candida krusei NOT DETECTED NOT DETECTED Final   Candida parapsilosis NOT DETECTED NOT DETECTED Final   Candida tropicalis NOT DETECTED NOT DETECTED Final    Comment: Performed at [facility], [address] [place], [place] [address]  Culture, blood (routine x 2)     Status: None (Preliminary result)   Collection Time: 03/15/20  6:55 AM   Specimen: BLOOD LEFT HAND  Result Value Ref Range Status   Specimen Description BLOOD LEFT HAND  Final   Special Requests   Final    BOTTLES DRAWN AEROBIC AND ANAEROBIC Blood  Culture adequate volume   Culture   Final    NO GROWTH 2 DAYS Performed at [facility], [address], [place], [place] [address]    Report Status PENDING  Incomplete  Culture, blood (routine x 2)     Status: None (Preliminary result)   Collection Time: 03/15/20  6:55 AM   Specimen: BLOOD LEFT FOREARM  Result Value Ref Range Status   Specimen Description BLOOD LEFT FOREARM  Final   Special Requests   Final    BOTTLES DRAWN AEROBIC AND ANAEROBIC Blood Culture adequate volume   Culture   Final    NO GROWTH 2 DAYS Performed at [facility], [address], [place], [place] [address]    Report Status PENDING  Incomplete    IMAGING RESULTS:  I have personally reviewed the films ? Impression/Recommendation ? ?MSSA bacteremia .  The first time it was positive was on 03/03/2020 but he did not complete treatment and left AMA on 03/10/2020 to return on 03/13/2020.  The culture from 03/13/2020 is again positive. Patient likely has a deep source.  Need to do an MRI of the cervical spine to look for any collection or discitis or osteomyelitis.  Also needs TEE to rule out endocarditis.  We will change cefazolin to  nafcillin continuous infusion  Recent C4 vertebral fracture: Minimally displaced ?was seen by neurosurgery and Aspen collar was recommended  DKA: Resolved -management as per primary team.  On insulin.  Hypertension on amlodipine and losartan. Cocaine use  Patient needs psychiatric consultation to assess whether he has capacity to make decisions as it seems like he has poor insight into his condition.   ___________________________________________________ Discussed the management with the care team Note:  This document was prepared using Dragon voice recognition software and may include unintentional dictation errors.

## 2020-03-17 NOTE — Progress Notes (Signed)
Approx. 12:30am pt noted to be attempting to get out of bed, agitated and resistant to commands with staff, pt noted with intermittent agitation throughout shift, agitation noted increasing and patient more resistant to following commands, patient now cursing and becoming loud with attempts in redirection and expressed concern with patient's safety, [name], NP notified of patient behaviors and requested medication for patient agitation, order received for Ativan 2mg  IV once, medication administered and noted effective, patient now resting in bed with bed in low position and call bell in reach, will continue to monitor this shift and make NP aware of any continuing or new concerns with patient.

## 2020-03-17 NOTE — Consult Note (Signed)
[facility] Face-to-Face Psychiatry Consult   Reason for Consult:  Assess Capacity Referring Physician:  Floor MD Patient Identification: Spencer [name] MRN:  [id number] Principal Diagnosis: DKA (diabetic ketoacidoses) (HCC) Diagnosis:  Principal Problem:   DKA (diabetic ketoacidoses) (HCC) Active Problems:   Hypertension   Diabetic neuropathy (HCC)   DM2 (diabetes mellitus, type 2) (HCC)   History of cocaine abuse (HCC)   Personal history of subdural hematoma   AKI (acute kidney injury) (HCC)   GERD (gastroesophageal reflux disease)   C4 cervical fracture (HCC)   MSSA bacteremia   Cigarette smoker   Pressure injury of skin  Total Time spent with patient: 15 minutes  Subjective:   Spencer [name] is a 59 y.o. male patient admitted with inability to care for himself  I talked to the patient and discussed the patient's care with his nurse.  The patient is able to carry on a fairly normal conversation regarding his frustration with being in the hospital.  He shows little insight into his medical problems or any behaviors prior to his admission.  He does appear to understand that he can no longer take care of himself.  He did not feel that he was depressed or had any psychiatric problems.  I see no evidence of delusions or hallucinations and the agitation described below does not to be caught appear to be caused by a psychiatric process other than his general disdain for others.  He speaks about his house that he is sharing with his sister and his frustration with his sister and her desire to alter the living arrangements such as to rent out one of the rooms of his house.  I do not have a way to verify the accuracy of that information.  HPI per Floor MD:  Spencer [name] a 59 y.o.malewith medical history significantfor type 2 diabetes mellitus, cocaine abuse,brain aneurysm,hypertension, GERD.Patient was recently admitted on 4/28/21for DKA, acute renal failure and patient signed out against  medical adviceon 03/10/2020. Patient brought in by EMS. Patient is not sure why he is here. As per him his sister called EMS. Patient states his blood glucose levels were very high. When asked how compliant is he with his insulin he told me that he is taking his insulin but he does have a history of noncompliance. He denies any chest pain, shortness of breath. Denies any abdominal pain, nausea or any vomitings. No fevers or chills. Denies any diarrhea. Does complain of constipation. Does state that he has not been able to urinate much.  Recent agitation per Nursing: Approx. 12:30am pt noted to be attempting to get out of bed, agitated and resistant to commands with staff, pt noted with intermittent agitation throughout shift, agitation noted increasing and patient more resistant to following commands, patient now cursing and becoming loud with attempts in redirection and expressed concern with patient's safety, Spencer Schwartz, NP notified of patient behaviors and requested medication for patient agitation, order received for Ativan  IV once, medication administered and noted effective, patient now resting in bed with bed in low position and call bell in reach, will continue to monitor this shift and make NP aware of any continuing or new concerns with patient.   Past Psychiatric History:  Risk to Self:   Risk to Others:   Prior Inpatient Therapy:   Prior Outpatient Therapy:    Past Medical History:  Past Medical History:  Diagnosis Date  . Brain aneurysm   . Broken bones    clavical, ankle,  arms toes wriast   . Diabetes mellitus without complication (HCC)   . Dysrhythmia   . GERD (gastroesophageal reflux disease)   . Hypertension   . Substance abuse [facility](HCC)     Past Surgical History:  Procedure Laterality Date  . CORONARY/GRAFT ACUTE MI REVASCULARIZATION N/A 09/04/2019   Procedure: Coronary/Graft Acute MI Revascularization;  Surgeon: Alwyn Peaallwood, Dwayne D, MD;  Location: [facility]  INVASIVE CV LAB;  Service: Cardiovascular;  Laterality: N/A;  . ESOPHAGOGASTRODUODENOSCOPY (EGD) WITH PROPOFOL N/A 06/04/2019   Procedure: ESOPHAGOGASTRODUODENOSCOPY (EGD) WITH PROPOFOL;  Surgeon: Toledo, Boykin Nearingeodoro K, MD;  Location: [facility] ENDOSCOPY;  Service: Gastroenterology;  Laterality: N/A;  . HERNIA REPAIR    . IMPLANTATION / PLACEMENT OF STRIP ELECTRODES VIA BURR HOLES SUBDURAL     anyrusum  . LEFT HEART CATH AND CORONARY ANGIOGRAPHY N/A 09/04/2019   Procedure: LEFT HEART CATH AND CORONARY ANGIOGRAPHY;  Surgeon: Alwyn Peaallwood, Dwayne D, MD;  Location: [facility] INVASIVE CV LAB;  Service: Cardiovascular;  Laterality: N/A;  . NECK SURGERY    . TEE WITHOUT CARDIOVERSION N/A 06/03/2019   Procedure: TRANSESOPHAGEAL ECHOCARDIOGRAM (TEE);  Surgeon: Dalia HeadingFath, Kenneth A, MD;  Location: [facility] ORS;  Service: Cardiovascular;  Laterality: N/A;   Family History:  Family History  Problem Relation Age of Onset  . CAD Sister   . Hypertension Sister   . Healthy Mother   . Healthy Father    Family Psychiatric  History: Social History:  Social History   Substance and Sexual Activity  Alcohol Use Yes  . Alcohol/week: 1.0 standard drinks  . Types: 1 Cans of beer per week     Social History   Substance and Sexual Activity  Drug Use Not Currently  . Types: Cocaine    Social History   Socioeconomic History  . Marital status: Single    Spouse name: Not on file  . Number of children: Not on file  . Years of education: Not on file  . Highest education level: Not on file  Occupational History  . Not on file  Tobacco Use  . Smoking status: Current Some Day Smoker  . Smokeless tobacco: Never Used  Substance and Sexual Activity  . Alcohol use: Yes    Alcohol/week: 1.0 standard drinks    Types: 1 Cans of beer per week  . Drug use: Not Currently    Types: Cocaine  . Sexual activity: Not on file  Other Topics Concern  . Not on file  Social History Narrative   ** Merged History Encounter **       Social  Determinants of Health   Financial Resource Strain:   . Difficulty of Paying Living Expenses:   Food Insecurity:   . Worried About Programme researcher, broadcasting/film/videounning Out of Food in the Last Year:   . Baristaan Out of Food in the Last Year:   Transportation Needs:   . Freight forwarderLack of Transportation (Medical):   Marland Kitchen. Lack of Transportation (Non-Medical):   Physical Activity:   . Days of Exercise per Week:   . Minutes of Exercise per Session:   Stress:   . Feeling of Stress :   Social Connections:   . Frequency of Communication with Friends and Family:   . Frequency of Social Gatherings with Friends and Family:   . Attends Religious Services:   . Active Member of Clubs or Organizations:   . Attends BankerClub or Organization Meetings:   Marland Kitchen. Marital Status:    Additional Social History:    Allergies:  No Known Allergies  Labs:  Results  for orders placed or performed during the hospital encounter of 03/13/20 (from the past 48 hour(s))  Glucose, capillary     Status: Abnormal   Collection Time: 03/15/20  9:09 PM  Result Value Ref Range   Glucose-Capillary 140 (H) 70 - 99 mg/dL    Comment: Glucose reference range applies only to samples taken after fasting for at least 8 hours.   Comment 1 Notify RN   Basic metabolic panel     Status: Abnormal   Collection Time: 03/16/20  4:43 AM  Result Value Ref Range   Sodium 132 (L) 135 - 145 mmol/L   Potassium 4.0 3.5 - 5.1 mmol/L   Chloride 98 98 - 111 mmol/L   CO2 24 22 - 32 mmol/L   Glucose, Bld 445 (H) 70 - 99 mg/dL    Comment: Glucose reference range applies only to samples taken after fasting for at least 8 hours.   BUN 22 8 - 23 mg/dL   Creatinine, Ser 9.03 0.61 - 1.24 mg/dL   Calcium 8.4 (L) 8.9 - 10.3 mg/dL   GFR calc non Af Amer >60 >60 mL/min   GFR calc Af Amer >60 >60 mL/min   Anion gap 10 5 - 15    Comment: Performed at [facility], [address] [place], [place] [address]  Glucose, capillary     Status: Abnormal   Collection Time: 03/16/20  7:35 AM  Result  Value Ref Range   Glucose-Capillary 439 (H) 70 - 99 mg/dL    Comment: Glucose reference range applies only to samples taken after fasting for at least 8 hours.   Comment 1 Notify RN    Comment 2 Document in Chart   Glucose, capillary     Status: Abnormal   Collection Time: 03/16/20 11:52 AM  Result Value Ref Range   Glucose-Capillary 482 (H) 70 - 99 mg/dL    Comment: Glucose reference range applies only to samples taken after fasting for at least 8 hours.   Comment 1 Notify RN    Comment 2 Document in Chart   Glucose, capillary     Status: Abnormal   Collection Time: 03/16/20  4:33 PM  Result Value Ref Range   Glucose-Capillary 387 (H) 70 - 99 mg/dL    Comment: Glucose reference range applies only to samples taken after fasting for at least 8 hours.   Comment 1 Notify RN    Comment 2 Document in Chart   Urine Drug Screen, Qualitative ([facility] only)     Status: None   Collection Time: 03/16/20  4:43 PM  Result Value Ref Range   Tricyclic, Ur Screen NONE DETECTED NONE DETECTED   Amphetamines, Ur Screen NONE DETECTED NONE DETECTED   MDMA (Ecstasy)Ur Screen NONE DETECTED NONE DETECTED   Cocaine Metabolite,Ur [place] NONE DETECTED NONE DETECTED   Opiate, Ur Screen NONE DETECTED NONE DETECTED   Phencyclidine (PCP) Ur S NONE DETECTED NONE DETECTED   Cannabinoid 50 Ng, Ur [place] NONE DETECTED NONE DETECTED   Barbiturates, Ur Screen NONE DETECTED NONE DETECTED   Benzodiazepine, Ur Scrn NONE DETECTED NONE DETECTED   Methadone Scn, Ur NONE DETECTED NONE DETECTED    Comment: (NOTE) Tricyclics + metabolites, urine    Cutoff 1000 ng/mL Amphetamines + metabolites, urine  Cutoff 1000 ng/mL MDMA (Ecstasy), urine              Cutoff 500 ng/mL Cocaine Metabolite, urine          Cutoff 300 ng/mL Opiate +  metabolites, urine        Cutoff 300 ng/mL Phencyclidine (PCP), urine         Cutoff 25 ng/mL Cannabinoid, urine                 Cutoff 50 ng/mL Barbiturates + metabolites, urine  Cutoff 200  ng/mL Benzodiazepine, urine              Cutoff 200 ng/mL Methadone, urine                   Cutoff 300 ng/mL The urine drug screen provides only a preliminary, unconfirmed analytical test result and should not be used for non-medical purposes. Clinical consideration and professional judgment should be applied to any positive drug screen result due to possible interfering substances. A more specific alternate chemical method must be used in order to obtain a confirmed analytical result. Gas chromatography / mass spectrometry (GC/MS) is the preferred confirmat ory method. Performed at [facility], [address] [place], [place] [address]   Glucose, capillary     Status: Abnormal   Collection Time: 03/16/20  9:26 PM  Result Value Ref Range   Glucose-Capillary 218 (H) 70 - 99 mg/dL    Comment: Glucose reference range applies only to samples taken after fasting for at least 8 hours.   Comment 1 Notify RN   Basic metabolic panel     Status: Abnormal   Collection Time: 03/17/20  5:24 AM  Result Value Ref Range   Sodium 133 (L) 135 - 145 mmol/L   Potassium 3.6 3.5 - 5.1 mmol/L   Chloride 98 98 - 111 mmol/L   CO2 24 22 - 32 mmol/L   Glucose, Bld 364 (H) 70 - 99 mg/dL    Comment: Glucose reference range applies only to samples taken after fasting for at least 8 hours.   BUN 25 (H) 8 - 23 mg/dL   Creatinine, Ser 6.76 0.61 - 1.24 mg/dL   Calcium 8.3 (L) 8.9 - 10.3 mg/dL   GFR calc non Af Amer >60 >60 mL/min   GFR calc Af Amer >60 >60 mL/min   Anion gap 11 5 - 15    Comment: Performed at [facility], [address] [place], [place] [address]  Phosphorus     Status: None   Collection Time: 03/17/20  6:24 AM  Result Value Ref Range   Phosphorus 2.5 2.5 - 4.6 mg/dL    Comment: Performed at [facility], [address] [place], [place] [address]  Magnesium     Status: None   Collection Time: 03/17/20  6:24 AM  Result Value Ref Range   Magnesium 1.8 1.7  - 2.4 mg/dL    Comment: Performed at [facility], [address] [place], [place] [address]  Glucose, capillary     Status: Abnormal   Collection Time: 03/17/20  7:30 AM  Result Value Ref Range   Glucose-Capillary 417 (H) 70 - 99 mg/dL    Comment: Glucose reference range applies only to samples taken after fasting for at least 8 hours.  Glucose, capillary     Status: Abnormal   Collection Time: 03/17/20  9:14 AM  Result Value Ref Range   Glucose-Capillary 307 (H) 70 - 99 mg/dL    Comment: Glucose reference range applies only to samples taken after fasting for at least 8 hours.  Glucose, capillary     Status: Abnormal   Collection Time: 03/17/20  9:20 AM  Result  Value Ref Range   Glucose-Capillary 337 (H) 70 - 99 mg/dL    Comment: Glucose reference range applies only to samples taken after fasting for at least 8 hours.  Glucose, capillary     Status: Abnormal   Collection Time: 03/17/20 11:30 AM  Result Value Ref Range   Glucose-Capillary 165 (H) 70 - 99 mg/dL    Comment: Glucose reference range applies only to samples taken after fasting for at least 8 hours.  Glucose, capillary     Status: Abnormal   Collection Time: 03/17/20  4:22 PM  Result Value Ref Range   Glucose-Capillary 162 (H) 70 - 99 mg/dL    Comment: Glucose reference range applies only to samples taken after fasting for at least 8 hours.    Current Facility-Administered Medications  Medication Dose Route Frequency Provider Last Rate Last Admin  . 0.9 %  sodium chloride infusion   Intravenous PRN Sidney Ace, MD   Stopped at 03/16/20 0800  . acetaminophen (TYLENOL) tablet 650 mg  650 mg Oral Q6H PRN Ralene Muskrat B, MD   650 mg at 03/17/20 1652  . amLODipine (NORVASC) tablet 10 mg  10 mg Oral Daily Rawla, Ethelene Hal, MD   10 mg at 03/17/20 0946  . aspirin EC tablet 81 mg  81 mg Oral Daily Rawla, Prashanth, MD   81 mg at 03/17/20 0946  . bisacodyl (DULCOLAX) EC tablet 5 mg  5 mg Oral Daily PRN  Val Riles, MD      . bisacodyl (DULCOLAX) suppository 10 mg  10 mg Rectal Daily PRN Val Riles, MD      . Chlorhexidine Gluconate Cloth 2 % PADS 6 each  6 each Topical Daily Ralene Muskrat B, MD   6 each at 03/14/20 1050  . dextrose 50 % solution 0-50 mL  0-50 mL Intravenous PRN Rawla, Prashanth, MD      . enoxaparin (LOVENOX) injection 40 mg  40 mg Subcutaneous Q24H Rawla, Prashanth, MD   40 mg at 03/16/20 2141  . famotidine (PEPCID) tablet 20 mg  20 mg Oral Daily Ralene Muskrat B, MD   20 mg at 03/16/20 2141  . insulin aspart (novoLOG) injection 0-15 Units  0-15 Units Subcutaneous TID WC Ralene Muskrat B, MD   3 Units at 03/17/20 1300  . insulin aspart (novoLOG) injection 0-5 Units  0-5 Units Subcutaneous QHS Ralene Muskrat B, MD   2 Units at 03/16/20 2142  . insulin aspart (novoLOG) injection 10 Units  10 Units Subcutaneous TID WC Ralene Muskrat B, MD   10 Units at 03/16/20 1853  . insulin detemir (LEVEMIR) injection 30 Units  30 Units Subcutaneous Q24H Val Riles, MD   30 Units at 03/17/20 [phone]  . losartan (COZAAR) tablet 50 mg  50 mg Oral Daily Ralene Muskrat B, MD   50 mg at 03/17/20 0946  . methocarbamol (ROBAXIN) tablet 500 mg  500 mg Oral TID Ralene Muskrat B, MD   500 mg at 03/17/20 1536  . multivitamin with minerals tablet 1 tablet  1 tablet Oral Daily Ralene Muskrat B, MD   1 tablet at 03/17/20 0946  . nafcillin 12 g in sodium chloride 0.9 % 500 mL continuous infusion  12 g Intravenous Q24H Ravishankar, Joellyn Quails, MD 20.8 mL/hr at 03/17/20 1500 Rate Verify at 03/17/20 1500  . polyethylene glycol (MIRALAX / GLYCOLAX) packet 17 g  17 g Oral Daily Val Riles, MD   17 g at 03/17/20 1537  . protein supplement (ENSURE MAX) liquid  11 oz Oral BID BM Lolita Patella B, MD   11 oz at 03/17/20 1540    Psychiatric Specialty Exam: Physical Exam  Review of Systems  Blood pressure 115/70, pulse 93, temperature 98.4 F (36.9 C), resp. rate 16, height 5\' 10"   (1.778 m), weight 60.3 kg, SpO2 94 %.Body mass index is 19.07 kg/m.  General Appearance: Disheveled  Eye Contact:  None  Speech:  Normal Rate and Minimal  Volume:  Normal  Mood:  Angry and Irritable  Affect:  Congruent and Full Range  Thought Process:  Goal Directed and Linear  Orientation:  Full (Time, Place, and Person)  Thought Content:  Logical  Suicidal Thoughts:  No  Homicidal Thoughts:  No  Memory:  Immediate;   Fair  Judgement:  Impaired  Insight:  Shallow  Psychomotor Activity:  Normal  Concentration:  Concentration: Fair  Recall:  of Knowledge:  Fair  Language:  Fair  Akathisia:  No    AIMS (if indicated):     Assets:  Others:  None noted  ADL's:  Impaired  Cognition:  Impaired,  Mild  Sleep:      I was asked to assess his capacity.  His prior signing out AMA and this admission clearly argue that he can no longer take care of himself.  He is apparently aware of that reality and he stated that he would be willing to go to a different home than his own.  He also said that he did not feel like he could take care of himself in his home.  These appear to be rational and accurate statements.  Treatment Plan Summary:  His agitation appears to be episodic and to have responded well from lorazepam.  In the future I would probably recommend low-dose haloperidol starting with 2 mg.  That may have a longer lasting effect and if successful could be continued on a more regular basis if agitation continues.  Disposition: According to my conversation with nursing the patient's medical status is far from stable at this point and a longer stay in the hospital is anticipated.  I would expect that the above agitation may continue and this will be the primary concern from a psychiatric standpoint.  In the longer term once placement is being investigated I feel that he currently recognizes the need for a higher level of care than can be obtained in the home and that is most  certainly what will be required to avoid a readmission for similar medical complications.   [employer], MD 03/17/2020 6:06 PM

## 2020-03-17 NOTE — Progress Notes (Signed)
Telemetry called and said patient had 5 beats of vtach and a pair of PVCs. Patient has been resting in bed, asymptomatic. NP notified.

## 2020-03-18 ENCOUNTER — Encounter
Admission: EM | Disposition: A | Discharge status: Skilled Nursing Facility | Payer: Self-pay | Source: Home / Self Care | Attending: Internal Medicine

## 2020-03-18 DIAGNOSIS — E1010 Type 1 diabetes mellitus with ketoacidosis without coma: Secondary | ICD-10-CM

## 2020-03-18 LAB — GLUCOSE, CAPILLARY
Glucose-Capillary: 268 mg/dL — ABNORMAL HIGH (ref 70–99)
Glucose-Capillary: 148 mg/dL — ABNORMAL HIGH (ref 70–99)
Glucose-Capillary: 154 mg/dL — ABNORMAL HIGH (ref 70–99)

## 2020-03-18 LAB — CBC
HCT: 28.6 % — ABNORMAL LOW (ref 39.0–52.0)
Hemoglobin: 9.7 g/dL — ABNORMAL LOW (ref 13.0–17.0)
MCH: 30.1 pg (ref 26.0–34.0)
MCHC: 33.9 g/dL (ref 30.0–36.0)
MCV: 88.8 fL (ref 80.0–100.0)
Platelets: 369 10*3/uL (ref 150–400)
RBC: 3.22 MIL/uL — ABNORMAL LOW (ref 4.22–5.81)
RDW: 13.8 % (ref 11.5–15.5)
WBC: 16.4 10*3/uL — ABNORMAL HIGH (ref 4.0–10.5)
nRBC: 0 % (ref 0.0–0.2)

## 2020-03-18 LAB — MAGNESIUM: Magnesium: 2.1 mg/dL (ref 1.7–2.4)

## 2020-03-18 LAB — BASIC METABOLIC PANEL
Anion gap: 7 (ref 5–15)
BUN: 25 mg/dL — ABNORMAL HIGH (ref 8–23)
CO2: 28 mmol/L (ref 22–32)
Calcium: 8.1 mg/dL — ABNORMAL LOW (ref 8.9–10.3)
Chloride: 100 mmol/L (ref 98–111)
Creatinine, Ser: 0.58 mg/dL — ABNORMAL LOW (ref 0.61–1.24)
GFR calc Af Amer: >60 mL/min (ref >60)
GFR calc non Af Amer: >60 mL/min (ref >60)
Glucose, Bld: 281 mg/dL — ABNORMAL HIGH (ref 70–99)
Potassium: 3.2 mmol/L — ABNORMAL LOW (ref 3.5–5.1)
Sodium: 135 mmol/L (ref 135–145)

## 2020-03-18 LAB — PHOSPHORUS: Phosphorus: 2.9 mg/dL (ref 2.5–4.6)

## 2020-03-18 SURGERY — ECHOCARDIOGRAM, TRANSESOPHAGEAL
Anesthesia: Moderate Sedation

## 2020-03-18 MED ORDER — SODIUM CHLORIDE 0.9 % IV SOLN
12.0000 g | INTRAVENOUS | Status: DC
  Administered 2020-03-19 – 2020-03-24 (×6): 12 g via INTRAVENOUS
  Filled 2020-03-18 (×7): qty 12000

## 2020-03-18 MED ORDER — HALOPERIDOL 2 MG PO TABS
2.0000 mg | ORAL_TABLET | Freq: Four times a day (QID) | ORAL | Status: DC | PRN
  Administered 2020-03-28 – 2020-04-02 (×5): 2 mg via ORAL
  Filled 2020-03-18 (×8): qty 1

## 2020-03-18 MED ORDER — POTASSIUM CHLORIDE CRYS ER 20 MEQ PO TBCR
40.0000 meq | EXTENDED_RELEASE_TABLET | Freq: Four times a day (QID) | ORAL | Status: AC
  Administered 2020-03-18 (×2): 40 meq via ORAL
  Filled 2020-03-18 (×2): qty 2
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
## 2020-03-18 NOTE — Progress Notes (Signed)
OT Cancellation Note  Patient Details Name: Spencer [name] MRN: [id number] DOB: 1961-06-07   Cancelled Treatment:    Reason Eval/Treat Not Completed: Patient declined, no reason specified. Thank you for the OT consult. Order received, and chart reviewed. Upon arrival to pt room, pt supine in bed. Pt states he would like to sleep this pm. Declines OT services at this time. OT attempts to educate pt on role of OT in acute setting and importance of mobility during hospitalization. Pt continues to decline therapy stating "I want to sleep today, I'll be here for a few days so you can come back later". Will re-attempt as available and pt medically appropriate for OT services.   Rockney Ghee, M.S., OTR/L Ascom: [phone] 03/18/20, 1:23 PM

## 2020-03-18 NOTE — Progress Notes (Signed)
Date of Admission:  03/13/2020      Subjective: Patient lying with eyes closed.  Responds to questions appropriately.  Says he is feeling little better.  Says he needs pain medication for his neck.  Is not wearing the Aspen collar  Medications:  . amLODipine  10 mg Oral Daily  . aspirin EC  81 mg Oral Daily  . Chlorhexidine Gluconate Cloth  6 each Topical Daily  . enoxaparin (LOVENOX) injection  40 mg Subcutaneous Q24H  . famotidine  20 mg Oral Daily  . insulin aspart  0-15 Units Subcutaneous TID WC  . insulin aspart  0-5 Units Subcutaneous QHS  . insulin aspart  10 Units Subcutaneous TID WC  . insulin detemir  30 Units Subcutaneous Q24H  . losartan  50 mg Oral Daily  . methocarbamol  500 mg Oral TID  . multivitamin with minerals  1 tablet Oral Daily  . polyethylene glycol  17 g Oral Daily  . potassium chloride  40 mEq Oral Q6H  . Ensure Max Protein  11 oz Oral BID BM    Objective: Vital signs in last 24 hours: Temp:  [97.5 F (36.4 C)-98.7 F (37.1 C)] 98.7 F (37.1 C) (05/13 0829) Pulse Rate:  [90-93] 90 (05/13 0829) Resp:  [18-20] 20 (05/13 0829) BP: (114-133)/(73) 114/73 (05/13 0829) SpO2:  [97 %-100 %] 100 % (05/13 0829)  PHYSICAL EXAM:  General: Lying with eyes closed but responds appropriately Heart: Regular rate and rhythm, no murmur, rub or gallop. Abdomen: Soft, non-tender,not distended. Bowel sounds normal. No masses Extremities: atraumatic, no cyanosis. No edema. No clubbing Skin: No rashes or lesions. Or bruising Lymph: Cervical, supraclavicular normal. Neurologic: Grossly non-focal  Lab Results Recent Labs    03/17/20 0524 03/18/20 0439  WBC  --  16.4*  HGB  --  9.7*  HCT  --  28.6*  NA 133* 135  K 3.6 3.2*  CL 98 100  CO2 24 28  BUN 25* 25*  CREATININE 0.77 0.58*    Microbiology: 03/04/2020 blood culture staph aureus  03/06/2020 blood culture negative Blood culture from 5/ 8 staph aureus 5/10 blood culture pending Studies/Results: MR  CERVICAL SPINE W WO CONTRAST  Result Date: 03/17/2020 CLINICAL DATA:  Cervical spine fracture. MSSA bacteremia EXAM: MRI CERVICAL SPINE WITHOUT AND WITH CONTRAST TECHNIQUE: Multiplanar and multiecho pulse sequences of the cervical spine, to include the craniocervical junction and cervicothoracic junction, were obtained without and with intravenous contrast. CONTRAST:  76mL GADAVIST GADOBUTROL 1 MMOL/ML IV SOLN COMPARISON:  CT cervical spine 03/05/2020 FINDINGS: The examination is severely degraded by motion. There is an intermediate sized prevertebral effusion that extends the length of the cervical spine and measures 10 mm in thickness. I do not see evidence of discitis-osteomyelitis on the single sagittal series that is not terribly motion degraded (series 12). IMPRESSION: 1. Severely motion degraded examination. 2. No evidence of discitis-osteomyelitis, though assessment is limited to a single sagittal STIR sequence. 3. Prevertebral effusion extending the length of the cervical spine, 10 mm in thickness. Electronically Signed   By: Deatra Robinson M.D.   On: 03/17/2020 23:37     Assessment/Plan:  Staph aureus bacteremia in a patient who is using cocaine.  He denies IV drug use Concern for endocarditis and also concern for vertebral infection. He has fracture of the C4 vertebrae and the MRI done yesterday showed prevertebral effusion.  There was no involvement of the bone or the disc but the film was limited by movement. We  will have to treat this like a deep-seated infection with 6 weeks of IV antibiotics. Currently on nafcillin and white count is coming down Patient had left AMA twice before.  Very likely will have to be placed in a nursing facility or rehab to get his IV antibiotics.  Cardiology has deferred TEE because of patient's overall situation.  C4 vertebral fracture.  Needs Aspen collar to be worn all the time.  Diabetes mellitus noncompliant with medication and hence gets admitted with  DKA.  Cocaine abuse  Discussed the management with the care team

## 2020-03-18 NOTE — Progress Notes (Signed)
Inpatient Diabetes Program Recommendations  AACE/ADA: New Consensus Statement on Inpatient Glycemic Control (2015)  Target Ranges:  Prepandial:   less than 140 mg/dL      Peak postprandial:   less than 180 mg/dL (1-2 hours)      Critically ill patients:  140 - 180 mg/dL   Lab Results  Component Value Date   GLUCAP 92 03/17/2020   HGBA1C 12.6 (H) 02/15/2020    Review of Glycemic Control Results for RULON, ABDALLA (MRN [id number]) as of 03/18/2020 08:53  Ref. Range 03/17/2020 07:30 03/17/2020 09:14 03/17/2020 09:20 03/17/2020 11:30 03/17/2020 16:22 03/17/2020 20:40  Glucose-Capillary Latest Ref Range: 70 - 99 mg/dL 354 (H)  Novolog 15 units 307 (H)   337 (H)  Novolog 11 units  Levemir 30 units 165 (H)  Novolog 3 units 162 (H)  Novolog 3 units 92  Results for JILES, GOYA (MRN [id number]) as of 03/18/2020 08:53  Ref. Range 03/18/2020 04:39  Glucose Latest Ref Range: 70 - 99 mg/dL 734 (H)   Diabetes history: DM 2 Home DM Meds:Lantus 18 units Daily Novolog 12 units TID  Current Orders:Levemir30units Daily Novolog Moderate Correction Scale/ SSI (0-15 units) TID AC + HS Novolog10units TID  Inpatient Diabetes Program Recommendations:    Note Pt did not receive Novolog 10 units meal coverage due to poor po intake yesterday. Fasting glucose this am 280's.  - Increase Lantus to 35 units  Thanks,  Christena Deem RN, MSN, BC-ADM Inpatient Diabetes Coordinator Team Pager [phone] (8a-5p)

## 2020-03-18 NOTE — Evaluation (Signed)
Physical Therapy Evaluation Patient Details Name: Spencer [name] MRN: [id number] DOB: [birth year] Today's Date: 03/18/2020   History of Present Illness  Pt admitted for MSSA bactermia and elevated BG. Pt with recent admission for similar issues, left AMA. HIstory includes DM, cocaine abuse, brain aneurysm, HTN, and GERD. Last admission with + C4 fx with Aspen collar in place.  Clinical Impression  Pt is a pleasant 59 year old male who was admitted for MSSA bactermia and elevated BG. Pt performs bed mobility with min assist, transfers with cga, and ambulation with cga and RW. Educated on proper donning of C-collar and to wear for all mobility. Pt demonstrates deficits with strength/balance/endurance/pain. Pt is somewhat self limiting and asking for pain meds. Physically the patient does well, however does need supervision for safety. Pt is very vague about where he will discharge and states "If [facility] won't let me stay, I'll just go to a hotel." Would benefit from skilled PT to address above deficits and promote optimal return to PLOF. Recommend transition to HHPT upon discharge from acute hospitalization.     Follow Up Recommendations Home health PT;Supervision for mobility/OOB    Equipment Recommendations  Rolling walker with 5" wheels    Recommendations for Other Services       Precautions / Restrictions Precautions Precautions: Fall Required Braces or Orthoses: Cervical Brace Cervical Brace: Hard collar Restrictions Weight Bearing Restrictions: No      Mobility  Bed Mobility Overal bed mobility: Needs Assistance Bed Mobility: Supine to Sit;Sit to Supine     Supine to sit: Min assist Sit to supine: Min guard   General bed mobility comments: assisted in donning neck brace prior to mobility attempts. Pt reaches out for therapist hand for assistance. Once seated, upright posture noted  Transfers Overall transfer level: Needs assistance Equipment used: Rolling walker (2  wheeled) Transfers: Sit to/from Stand Sit to Stand: Min guard         General transfer comment: cues for hand placement. Once standing, upright posture noted  Ambulation/Gait Ambulation/Gait assistance: Min guard Gait Distance (Feet): 50 Feet Assistive device: Rolling walker (2 wheeled) Gait Pattern/deviations: Narrow base of support;Step-through pattern     General Gait Details: ambulated room distances quickly and safely. Refuses further ambulation due to pain.  Stairs            Wheelchair Mobility    Modified Rankin (Stroke Patients Only)       Balance Overall balance assessment: History of Falls;Needs assistance Sitting-balance support: Single extremity supported;Feet supported Sitting balance-Leahy Scale: Good     Standing balance support: Bilateral upper extremity supported Standing balance-Leahy Scale: Good                               Pertinent Vitals/Pain Pain Assessment: 0-10 Pain Score: 8  Pain Location: post neck/head and R hip Pain Descriptors / Indicators: Aching;Discomfort;Dull Pain Intervention(s): Limited activity within patient's tolerance    Home Living Family/patient expects to be discharged to:: Private residence Living Arrangements: (pt very vague about support system) Available Help at Discharge: Friend(s) Type of Home: House           Additional Comments: reports he may stay with his friend Lorin Picket or he may go to a hotel. He is very vague about home living after discharge    Prior Function Level of Independence: Independent with assistive device(s)         Comments: reports since last admission,  he has been having difficulty with ambulation and using support to ambulate     Hand Dominance        Extremity/Trunk Assessment   Upper Extremity Assessment Upper Extremity Assessment: Overall WFL for tasks assessed    Lower Extremity Assessment Lower Extremity Assessment: Generalized weakness(B LE grossly  4/5)       Communication   Communication: No difficulties  Cognition Arousal/Alertness: Awake/alert Behavior During Therapy: WFL for tasks assessed/performed Overall Cognitive Status: Within Functional Limits for tasks assessed                                 General Comments: Pt very guarded and takes encouragement to participate      General Comments      Exercises Other Exercises Other Exercises: supine ther-ex performed on B LE including AP, SLR, hip abd/add, and hip add squeezes. All ther-ex performed x 10 reps with safe technique   Assessment/Plan    PT Assessment Patient needs continued PT services  PT Problem List Decreased balance;Decreased mobility;Decreased knowledge of use of DME;Decreased safety awareness;Pain       PT Treatment Interventions Gait training;DME instruction;Therapeutic exercise;Balance training    PT Goals (Current goals can be found in the Care Plan section)  Acute Rehab PT Goals Patient Stated Goal: To go home PT Goal Formulation: With patient Time For Goal Achievement: 04/01/20 Potential to Achieve Goals: Good    Frequency Min 2X/week   Barriers to discharge        Co-evaluation               AM-PAC PT "6 Clicks" Mobility  Outcome Measure Help needed turning from your back to your side while in a flat bed without using bedrails?: A Little Help needed moving from lying on your back to sitting on the side of a flat bed without using bedrails?: A Little Help needed moving to and from a bed to a chair (including a wheelchair)?: A Little Help needed standing up from a chair using your arms (e.g., wheelchair or bedside chair)?: A Little Help needed to walk in hospital room?: A Little Help needed climbing 3-5 steps with a railing? : A Little 6 Click Score: 18    End of Session Equipment Utilized During Treatment: Gait belt;Cervical collar Activity Tolerance: Patient tolerated treatment well Patient left: in bed;with  bed alarm set Nurse Communication: Mobility status PT Visit Diagnosis: History of falling (Z91.81);Difficulty in walking, not elsewhere classified (R26.2);Pain Pain - Right/Left: Right Pain - part of body: Hip    Time: 2542-7062 PT Time Calculation (min) (ACUTE ONLY): 29 min   Charges:   PT Evaluation $PT Eval Moderate Complexity: 1 Mod PT Treatments $Therapeutic Exercise: 8-22 mins        Greggory Stallion, PT, DPT [phone]   Spencer [name] 03/18/2020, 5:01 PM

## 2020-03-18 NOTE — TOC Progression Note (Signed)
Transition of Care [facility]) - Progression Note    Patient Details  Name: RADWAN COWLEY MRN: [id number] Date of Birth: 18-Jul-1961  Transition of Care [facility]) CM/SW [place], RN Phone Number: 03/18/2020, 11:02 AM  Clinical Narrative:     Met with the patient to discuss DC plan and needs He lives at home with his sister and dad He still drives He wants to get a rolling walker, I notified Zack at [place] He said his his sister and dad help him, he has a PCP Dr Genevie Ann Gets meds at [facility] He plans to go home and says his sister and dad help him,  He has a pT and OT eval ordered to determine any other recommendations, he is non compliant at home with meds and brace he is to wear on neck  Expected Discharge Plan: Home/Self Care Barriers to Discharge: Continued Medical Work up  Expected Discharge Plan and Services Expected Discharge Plan: Home/Self Care In-house Referral: Clinical Social Work Discharge Planning Services: CM Consult   Living arrangements for the past 2 months: Single Family Home                 DME Arranged: Walker rolling DME Agency: [employer] Date DME Agency Contacted: 03/18/20 Time DME Agency Contacted: 2 Representative spoke with at DME Agency: [place] ([place]) Interventions    Readmission Risk Interventions Readmission Risk Prevention Plan 03/05/2020 02/16/2020 12/17/2019  Transportation Screening Complete Complete Complete  PCP or Specialist Appt within 3-5 Days - - -  HRI or [place] or Home Care Consult comments - - -  Palliative Care Screening - - -  Medication Review (RN Care Manager) Complete Complete Complete  PCP or Specialist appointment within 3-5 days of discharge Patient refused Complete Patient refused  [place] or Home Care Consult Complete Complete Patient refused  SW Recovery Care/Counseling Consult - - -  SW Consult Not Complete Comments - - -  Palliative Care  Screening - - Not Applicable  Skilled Nursing Facility Complete Complete Not Applicable  Some recent data might be hidden

## 2020-03-18 NOTE — TOC Progression Note (Signed)
Transition of Care [facility]) - Progression Note    Patient Details  Name: Spencer [name] MRN: [id number] Date of Birth: 26-Apr-1961  Transition of Care [facility]) CM/SW Contact  Spencer Dunker, RN Phone Number: 03/18/2020, 1:50 PM  Clinical Narrative:    Spencer [name] the patients sister called and wanted me to call her, I asked the patient permission to give his sister a call.  He gives verbal permission.  I let him know his sister stated that he does not live with her or her dad and that they occassionally let him stay with them but he stays with Spencer [name] his friend, Who is a drug addict. He stated it was fine to call her.I called Spencer [name] and   She stated that he will not take his medications including insulin and he does drugs with Spencer [name].  She wants the patient to be put in some facility where he can't do drugs.  I explained to her that the patient is alert and oriented and he is deemed capable of making his own decisions and that legally we could not make him go anywhere.  I explained that he does not want to go to any facility he stated that he plans to go home.    She said that she knew that he would get out and come back to the hospital in a week because he is a "druggie and dope head". Explained that sometimes we have to allow patients to make decision that we do not agree with because they have the right to make their own choices by law. I explained I could not force the patient to go anywhere he did not want to go,  She stated understanding and hung up the phone   Expected Discharge Plan: Home/Self Care Barriers to Discharge: Continued Medical Work up  Expected Discharge Plan and Services Expected Discharge Plan: Home/Self Care In-house Referral: Clinical Social Work Discharge Planning Services: CM Consult   Living arrangements for the past 2 months: Single Family Home                 DME Arranged: Walker rolling DME Agency: [employer] Date DME Agency Contacted: 03/18/20 Time DME Agency  Contacted: 1100 Representative spoke with at DME Agency: Spencer [name]             Social Determinants of Health (SDOH) Interventions    Readmission Risk Interventions Readmission Risk Prevention Plan 03/05/2020 02/16/2020 12/17/2019  Transportation Screening Complete Complete Complete  PCP or Specialist Appt within 3-5 Days - - -  HRI or Home Care Consult - - -  HRI or Home Care Consult comments - - -  Palliative Care Screening - - -  Medication Review (RN Care Manager) Complete Complete Complete  PCP or Specialist appointment within 3-5 days of discharge Patient refused Complete Patient refused  HRI or Home Care Consult Complete Complete Patient refused  SW Recovery Care/Counseling Consult - - -  SW Consult Not Complete Comments - - -  Palliative Care Screening - - Not Applicable  Skilled Nursing Facility Complete Complete Not Applicable  Some recent data might be hidden

## 2020-03-18 NOTE — Progress Notes (Signed)
Nafcillin IV resheduled to daily at 0730 .  Received the following medication message from Jun Guiral, RN concerning the Nafcillin IV 12 gram continuous infusion : Pls reschedule the med. The med was not running the whole night when I received the pt. I restarted the med the I came this morning at 0715 with almost full bag.

## 2020-03-18 NOTE — Progress Notes (Signed)
PROGRESS NOTE    Spencer HakimRoger A Kham  ZOX:[id number] DOB: 01/19/1961 DOA: 03/13/2020 PCP: Patient, No Pcp Per   Brief Narrative:  Spencer [name] is a 59 y.o. male with medical history significant for type 2 diabetes mellitus, cocaine abuse,brain aneurysm,hypertension, GERD.  Patient was recently admitted on 03/03/20 for DKA, acute renal failure and patient signed out against medical advice on 03/10/2020.  Patient brought in by EMS.  Patient is not sure why he is here.  As per him his sister called EMS.  Patient states his blood glucose levels were very high.  When asked how compliant is he with his insulin he told me that he is taking his insulin but he does have a history of noncompliance.  He denies any chest pain, shortness of breath.  Denies any abdominal pain, nausea or any vomitings.  No fevers or chills.  Denies any diarrhea.  Does complain of constipation.  Does state that he has not been able to urinate much.    Assessment & Plan:   Principal Problem:   MSSA bacteremia Active Problems:   DKA (diabetic ketoacidoses) (HCC)   Hypertension   Diabetic neuropathy (HCC)   DM2 (diabetes mellitus, type 2) (HCC)   History of cocaine abuse (HCC)   Personal history of subdural hematoma   AKI (acute kidney injury) (HCC)   GERD (gastroesophageal reflux disease)   C4 cervical fracture (HCC)   Cigarette smoker   Pressure injury of skin    Acute diabetic ketoacidosis in a patient with type 2 diabetes mellitus, POA, resolved Acute metabolic acidosis secondary to DKA, POA, resolved -Anion gap of 30, serum bicarb level of 8 on admission -Patient was given 1 L of LR in the ED ER.   Was given another 2 L of normal saline bolus then placement 150 cc of normal saline per hour. -Was given insulin bolus, initiated on insulin drip in the ER as per the DKA protocol -Anion gap closed -Sugars improved Plan: Continue subcutaneous insulin regimen Carb modified diet CBG before meals and at bedtime Diabetes  coordinator consult Patient adherence to insulin regimen and treatment protocol is greatly in question  MSSA bacteremia Patient was noted to have MSSA bacteremia on previous admission.   At that time he left AGAINST MEDICAL ADVICE.   He was recommended for TEE for evaluation endocarditis.  However he left AMA before this could be performed.   Infectious disease has been consulted.   s/p Ancef, cardiology consult for TEE, but patient does not want to get it done at this time. 5/10 Repeat blood cultures NGTD.   MRI C-spine: IMPRESSION: 1. Severely motion degraded examination. 2. No evidence of discitis-osteomyelitis, though assessment is limited to a single sagittal STIR sequence. 3. Prevertebral effusion extending the length of the cervical spine,10 mm in thickness. Plan:  5/12 started nafcillin 12 g IV daily ID following, Rec. MRI C-spine and TEE after C-spine collar, psych consult as patient signed AMA to eval for capacity to make decisions Psych consult, recommended Haldol for agitation, patient seems to understand his condition and he relates that he is unable to take care of himself. Follow ID for duration of antibiotics and disposition plan WBC 16.4, continue to trend  Acute kidney injury secondary to dehydration, POA. Resolved s/p IVF -Avoid nephrotoxic agents -Monitor input and output   Mild hyperkalemia due to AKI, POA, Resolved Hypokalemia, potassium repleted.  Continue to monitor.  Hypertension, benign essential - Continue amlodipine - Restart losartan  Metabolic encephalopathy Likely multifactorial  Lethargic but answering all questions appropriately Possibly secondary to substance abuse, DKA, MSSA bacteremia Continue to monitor mental status  C4 fracture Was evaluated by NSG previous admission Recommended Aspen collar at all times, which patient has not been compliant with Will request Aspen collar from materials Treat with Robaxin Avoid narcotics Per  previous consult note by neurosurgery patient has been cleared for TEE but must wear Aspen collar for the duration of the procedure. Apply ice pack to decrease edema  PT OT eval for placement  DVT prophylaxis: Lovenox Code Status: Full Family Communication: None today  disposition Plan: Status is: Inpatient  Remains inpatient appropriate because:Inpatient level of care appropriate due to severity of illness   Dispo: The patient is from: Home              Anticipated d/c is to: Home              Anticipated d/c date is: 2 days              Patient currently is not medically stable to d/c.  MSSA bacteremia on IV antibiotics.  Resolved DKA.  Patient does not want TEE at this time as per cardiology. Pending ID recommendation for duration of antibiotics and PT/OT eval   Consultants:   ID  Cardiology  Procedures:   2D echo  Antimicrobials:   Cefazolin, 03/14/2020-  Nafcillin 03/17/20--   Subjective: No overnight issues, patient was seen and examined at bedside.  Complaining of pain in the neck and bilateral legs.  Pain in the neck was 9/10.  Patient denied any other active issues.    Objective: Vitals:   03/17/20 0731 03/17/20 1143 03/17/20 1518 03/17/20 2355  BP: 126/76 111/72 115/70 133/73  Pulse: 100 98 93 93  Resp: 16 16 16 18   Temp: 98.9 F (37.2 C) 98.2 F (36.8 C) 98.4 F (36.9 C) (!) 97.5 F (36.4 C)  TempSrc: Oral   Oral  SpO2: 94% 98% 94% 97%  Weight:      Height:        Intake/Output Summary (Last 24 hours) at 03/18/2020 0749 Last data filed at 03/18/2020 5361 Gross per 24 hour  Intake 792.66 ml  Output 2700 ml  Net -1907.34 ml   Filed Weights   03/13/20 1654 03/13/20 2055  Weight: 59 kg 60.3 kg    Examination:  General exam: Lethargic, appears disheveled, chronically ill Respiratory system: Poor respiratory effort, bibasilar crackles Cardiovascular system: Tachycardic, no murmurs, no pedal edema  gastrointestinal system: Nondistended,  nontender, normal bowel sounds Central nervous system: Lethargic, unable to fully assess, no obvious deficits Extremities: Unable to assess Skin: No rashes, lesions or ulcers Psychiatry: Irritable    Data Reviewed: I have personally reviewed following labs and imaging studies  CBC: Recent Labs  Lab 03/13/20 1659 03/18/20 0439  WBC 32.4* 16.4*  NEUTROABS 30.6*  --   HGB 11.9* 9.7*  HCT 38.7* 28.6*  MCV 100.8* 88.8  PLT 765* 443   Basic Metabolic Panel: Recent Labs  Lab 03/13/20 1659 03/13/20 2114 03/14/20 0855 03/15/20 0655 03/16/20 0443 03/17/20 0524 03/17/20 0624 03/18/20 0439  NA 139   < > 146* 137 132* 133*  --  135  K 5.4*   < > 3.9 3.4* 4.0 3.6  --  3.2*  CL 101   < > 118* 104 98 98  --  100  CO2 8*   < > 20* 25 24 24   --  28  GLUCOSE 637*   < >  187* 329* 445* 364*  --  281*  BUN 54*   < > 34* 17 22 25*  --  25*  CREATININE 1.89*   < > 0.93 0.64 0.71 0.77  --  0.58*  CALCIUM 9.6   < > 8.8* 8.6* 8.4* 8.3*  --  8.1*  MG 3.1*  --   --   --   --   --  1.8 2.1  PHOS 6.9*  --   --   --   --   --  2.5 2.9   < > = values in this interval not displayed.   GFR: Estimated Creatinine Clearance: 82.7 mL/min (A) (by C-G formula based on SCr of 0.58 mg/dL (L)). Liver Function Tests: Recent Labs  Lab 03/13/20 1659  AST 16  ALT 18  ALKPHOS 141*  BILITOT 3.0*  PROT 7.5  ALBUMIN 2.9*   Recent Labs  Lab 03/13/20 1659  LIPASE 13   No results for input(s): AMMONIA in the last 168 hours. Coagulation Profile: No results for input(s): INR, PROTIME in the last 168 hours. Cardiac Enzymes: No results for input(s): CKTOTAL, CKMB, CKMBINDEX, TROPONINI in the last 168 hours. BNP (last 3 results) No results for input(s): PROBNP in the last 8760 hours. HbA1C: No results for input(s): HGBA1C in the last 72 hours. CBG: Recent Labs  Lab 03/17/20 0914 03/17/20 0920 03/17/20 1130 03/17/20 1622 03/17/20 2040  GLUCAP 307* 337* 165* 162* 92   Lipid Profile: No results  for input(s): CHOL, HDL, LDLCALC, TRIG, CHOLHDL, LDLDIRECT in the last 72 hours. Thyroid Function Tests: No results for input(s): TSH, T4TOTAL, FREET4, T3FREE, THYROIDAB in the last 72 hours. Anemia Panel: No results for input(s): VITAMINB12, FOLATE, FERRITIN, TIBC, IRON, RETICCTPCT in the last 72 hours. Sepsis Labs: No results for input(s): PROCALCITON, LATICACIDVEN in the last 168 hours.  Recent Results (from the past 240 hour(s))  Respiratory Panel by RT PCR (Flu A&B, Covid) - Nasopharyngeal Swab     Status: None   Collection Time: 03/13/20  7:33 PM   Specimen: Nasopharyngeal Swab  Result Value Ref Range Status   SARS Coronavirus 2 by RT PCR NEGATIVE NEGATIVE Final    Comment: (NOTE) SARS-CoV-2 target nucleic acids are NOT DETECTED. The SARS-CoV-2 RNA is generally detectable in upper respiratoy specimens during the acute phase of infection. The lowest concentration of SARS-CoV-2 viral copies this assay can detect is 131 copies/mL. A negative result does not preclude SARS-Cov-2 infection and should not be used as the sole basis for treatment or other patient management decisions. A negative result may occur with  improper specimen collection/handling, submission of specimen other than nasopharyngeal swab, presence of viral mutation(s) within the areas targeted by this assay, and inadequate number of viral copies (<131 copies/mL). A negative result must be combined with clinical observations, patient history, and epidemiological information. The expected result is Negative. Fact Sheet for Patients:  https://www.moore.com/ Fact Sheet for Healthcare Providers:  https://www.young.biz/ This test is not yet ap proved or cleared by the [place] FDA and  has been authorized for detection and/or diagnosis of SARS-CoV-2 by FDA under an Emergency Use Authorization (EUA). This EUA will remain  in effect (meaning this test can be used) for the  duration of the COVID-19 declaration under Section 564(b)(1) of the Act, 21 U.S.C. section 360bbb-3(b)(1), unless the authorization is terminated or revoked sooner.    Influenza A by PCR NEGATIVE NEGATIVE Final   Influenza B by PCR NEGATIVE NEGATIVE Final    Comment: (  NOTE) The Xpert Xpress SARS-CoV-2/FLU/RSV assay is intended as an aid in  the diagnosis of influenza from Nasopharyngeal swab specimens and  should not be used as a sole basis for treatment. Nasal washings and  aspirates are unacceptable for Xpert Xpress SARS-CoV-2/FLU/RSV  testing. Fact Sheet for Patients: https://www.moore.com/ Fact Sheet for Healthcare Providers: https://www.young.biz/ This test is not yet approved or cleared by the [place] FDA and  has been authorized for detection and/or diagnosis of SARS-CoV-2 by  FDA under an Emergency Use Authorization (EUA). This EUA will remain  in effect (meaning this test can be used) for the duration of the  Covid-19 declaration under Section 564(b)(1) of the Act, 21  U.S.C. section 360bbb-3(b)(1), unless the authorization is  terminated or revoked. Performed at [facility], [address] [place], [place] [address]   Blood culture (routine x 2)     Status: Abnormal   Collection Time: 03/13/20  7:33 PM   Specimen: BLOOD  Result Value Ref Range Status   Specimen Description   Final    BLOOD LEFT HAND Performed at [facility], [address] [place], [place] [address]    Special Requests   Final    BOTTLES DRAWN AEROBIC AND ANAEROBIC Blood Culture results may not be optimal due to an inadequate volume of blood received in culture bottles Performed at [facility], [address], [place], [place] [address]    Culture  Setup Time   Final    GRAM POSITIVE COCCI IN BOTH AEROBIC AND ANAEROBIC BOTTLES CRITICAL RESULT CALLED TO, READ BACK BY AND VERIFIED WITH: [facility] HALLAJI AT 3086 03/14/20.PMF  Performed at [facility], [address], [place], [place] [address]    Culture STAPHYLOCOCCUS AUREUS (A)  Final   Report Status 03/16/2020 FINAL  Final   Organism ID, Bacteria STAPHYLOCOCCUS AUREUS  Final      Susceptibility   Staphylococcus aureus - MIC*    CIPROFLOXACIN <=0.5 SENSITIVE Sensitive     ERYTHROMYCIN >=8 RESISTANT Resistant     GENTAMICIN <=0.5 SENSITIVE Sensitive     OXACILLIN 0.5 SENSITIVE Sensitive     TETRACYCLINE <=1 SENSITIVE Sensitive     VANCOMYCIN 1 SENSITIVE Sensitive     TRIMETH/SULFA <=10 SENSITIVE Sensitive     CLINDAMYCIN RESISTANT Resistant     RIFAMPIN <=0.5 SENSITIVE Sensitive     Inducible Clindamycin POSITIVE Resistant     * STAPHYLOCOCCUS AUREUS  Blood culture (routine x 2)     Status: Abnormal   Collection Time: 03/13/20  7:33 PM   Specimen: BLOOD  Result Value Ref Range Status   Specimen Description   Final    BLOOD LEFT AC Performed at [facility], [address] [place], [place] [address]    Special Requests   Final    BOTTLES DRAWN AEROBIC AND ANAEROBIC Blood Culture results may not be optimal due to an inadequate volume of blood received in culture bottles Performed at [facility], [address] [place], [place] [address]    Culture  Setup Time   Final    GRAM POSITIVE COCCI IN BOTH AEROBIC AND ANAEROBIC BOTTLES CRITICAL RESULT CALLED TO, READ BACK BY AND VERIFIED WITH: [facility] HALLAJI AT 2440 03/14/20.PMF Performed at [facility], [address] [place], [place] [address]    Culture (A)  Final    STAPHYLOCOCCUS AUREUS SUSCEPTIBILITIES PERFORMED ON PREVIOUS CULTURE WITHIN THE LAST 5 DAYS. Performed at [facility], [address]  [place], [place] [address]    Report Status 03/16/2020 FINAL  Final  Blood Culture ID Panel (Reflexed)     Status: Abnormal   Collection Time: 03/13/20  7:33 PM  Result Value Ref Range Status   Enterococcus species NOT DETECTED NOT DETECTED Final   Listeria  monocytogenes NOT DETECTED NOT DETECTED Final   Staphylococcus species DETECTED (A) NOT DETECTED Final    Comment: CRITICAL RESULT CALLED TO, READ BACK BY AND VERIFIED WITH: [facility] HALLAJI AT 1224 03/14/20.PMF    Staphylococcus aureus (BCID) DETECTED (A) NOT DETECTED Final    Comment: Methicillin (oxacillin) susceptible Staphylococcus aureus (MSSA). Preferred therapy is anti staphylococcal beta lactam antibiotic (Cefazolin or Nafcillin), unless clinically contraindicated. CRITICAL RESULT CALLED TO, READ BACK BY AND VERIFIED WITH: [facility] HALLAJI AT 8250 03/14/20.PMF    Methicillin resistance NOT DETECTED NOT DETECTED Final   Streptococcus species NOT DETECTED NOT DETECTED Final   Streptococcus agalactiae NOT DETECTED NOT DETECTED Final   Streptococcus pneumoniae NOT DETECTED NOT DETECTED Final   Streptococcus pyogenes NOT DETECTED NOT DETECTED Final   Acinetobacter baumannii NOT DETECTED NOT DETECTED Final   Enterobacteriaceae species NOT DETECTED NOT DETECTED Final   Enterobacter cloacae complex NOT DETECTED NOT DETECTED Final   Escherichia coli NOT DETECTED NOT DETECTED Final   Klebsiella oxytoca NOT DETECTED NOT DETECTED Final   Klebsiella pneumoniae NOT DETECTED NOT DETECTED Final   Proteus species NOT DETECTED NOT DETECTED Final   Serratia marcescens NOT DETECTED NOT DETECTED Final   Haemophilus influenzae NOT DETECTED NOT DETECTED Final   Neisseria meningitidis NOT DETECTED NOT DETECTED Final   Pseudomonas aeruginosa NOT DETECTED NOT DETECTED Final   Candida albicans NOT DETECTED NOT DETECTED Final   Candida glabrata NOT DETECTED NOT DETECTED Final   Candida krusei NOT DETECTED NOT DETECTED Final   Candida parapsilosis NOT DETECTED NOT DETECTED Final   Candida tropicalis NOT DETECTED NOT DETECTED Final    Comment: Performed at [facility], [address] [place], [place] [address]  Culture, blood (routine x 2)     Status: None (Preliminary result)   Collection Time:  03/15/20  6:55 AM   Specimen: BLOOD LEFT HAND  Result Value Ref Range Status   Specimen Description BLOOD LEFT HAND  Final   Special Requests   Final    BOTTLES DRAWN AEROBIC AND ANAEROBIC Blood Culture adequate volume   Culture   Final    NO GROWTH 2 DAYS Performed at [facility], [address] [place], [place] [address]    Report Status PENDING  Incomplete  Culture, blood (routine x 2)     Status: None (Preliminary result)   Collection Time: 03/15/20  6:55 AM   Specimen: BLOOD LEFT FOREARM  Result Value Ref Range Status   Specimen Description BLOOD LEFT FOREARM  Final   Special Requests   Final    BOTTLES DRAWN AEROBIC AND ANAEROBIC Blood Culture adequate volume   Culture   Final    NO GROWTH 2 DAYS Performed at [facility], [address] [place], [place] [address]    Report Status PENDING  Incomplete         Radiology Studies: MR CERVICAL SPINE W WO CONTRAST  Result Date: 03/17/2020 CLINICAL DATA:  Cervical spine fracture. MSSA bacteremia EXAM: MRI CERVICAL SPINE WITHOUT AND WITH CONTRAST TECHNIQUE: Multiplanar and multiecho pulse sequences of the cervical spine, to include the craniocervical junction and cervicothoracic junction, were obtained without and with intravenous contrast. CONTRAST:  99mL GADAVIST GADOBUTROL 1  MMOL/ML IV SOLN COMPARISON:  CT cervical spine 03/05/2020 FINDINGS: The examination is severely degraded by motion. There is an intermediate sized prevertebral effusion that extends the length of the cervical spine and measures 10 mm in thickness. I do not see evidence of discitis-osteomyelitis on the single sagittal series that is not terribly motion degraded (series 12). IMPRESSION: 1. Severely motion degraded examination. 2. No evidence of discitis-osteomyelitis, though assessment is limited to a single sagittal STIR sequence. 3. Prevertebral effusion extending the length of the cervical spine, 10 mm in thickness. Electronically Signed    By: Deatra Robinson M.D.   On: 03/17/2020 23:37        Scheduled Meds: . amLODipine  10 mg Oral Daily  . aspirin EC  81 mg Oral Daily  . Chlorhexidine Gluconate Cloth  6 each Topical Daily  . enoxaparin (LOVENOX) injection  40 mg Subcutaneous Q24H  . famotidine  20 mg Oral Daily  . insulin aspart  0-15 Units Subcutaneous TID WC  . insulin aspart  0-5 Units Subcutaneous QHS  . insulin aspart  10 Units Subcutaneous TID WC  . insulin detemir  30 Units Subcutaneous Q24H  . losartan  50 mg Oral Daily  . methocarbamol  500 mg Oral TID  . multivitamin with minerals  1 tablet Oral Daily  . polyethylene glycol  17 g Oral Daily  . Ensure Max Protein  11 oz Oral BID BM   Continuous Infusions: . sodium chloride Stopped (03/16/20 0800)  . nafcillin (NAFCIL) continuous infusion 20.8 mL/hr at 03/18/20 0715     LOS: 5 days    Time spent: 35 minutes    Gillis Santa, MD [facility] Pager [phone]  If 7PM-7AM, please contact night-coverage 03/18/2020, 7:49 AM

## 2020-03-19 LAB — GLUCOSE, CAPILLARY
Glucose-Capillary: 350 mg/dL — ABNORMAL HIGH (ref 70–99)
Glucose-Capillary: 391 mg/dL — ABNORMAL HIGH (ref 70–99)
Glucose-Capillary: 107 mg/dL — ABNORMAL HIGH (ref 70–99)
Glucose-Capillary: 107 mg/dL — ABNORMAL HIGH (ref 70–99)

## 2020-03-19 LAB — BASIC METABOLIC PANEL
Anion gap: 7 (ref 5–15)
BUN: 24 mg/dL — ABNORMAL HIGH (ref 8–23)
CO2: 26 mmol/L (ref 22–32)
Calcium: 8.1 mg/dL — ABNORMAL LOW (ref 8.9–10.3)
Chloride: 102 mmol/L (ref 98–111)
Creatinine, Ser: 0.6 mg/dL — ABNORMAL LOW (ref 0.61–1.24)
GFR calc Af Amer: >60 mL/min (ref >60)
GFR calc non Af Amer: >60 mL/min (ref >60)
Glucose, Bld: 274 mg/dL — ABNORMAL HIGH (ref 70–99)
Potassium: 4.1 mmol/L (ref 3.5–5.1)
Sodium: 135 mmol/L (ref 135–145)

## 2020-03-19 LAB — CBC
HCT: 27.1 % — ABNORMAL LOW (ref 39.0–52.0)
Hemoglobin: 9.4 g/dL — ABNORMAL LOW (ref 13.0–17.0)
MCH: 30.8 pg (ref 26.0–34.0)
MCHC: 34.7 g/dL (ref 30.0–36.0)
MCV: 88.9 fL (ref 80.0–100.0)
Platelets: 464 10*3/uL — ABNORMAL HIGH (ref 150–400)
RBC: 3.05 MIL/uL — ABNORMAL LOW (ref 4.22–5.81)
RDW: 13.9 % (ref 11.5–15.5)
WBC: 16.4 10*3/uL — ABNORMAL HIGH (ref 4.0–10.5)
nRBC: 0 % (ref 0.0–0.2)

## 2020-03-19 LAB — SEDIMENTATION RATE: Sed Rate: 129 mm/hr — ABNORMAL HIGH (ref 0–20)

## 2020-03-19 LAB — PHOSPHORUS: Phosphorus: 2.1 mg/dL — ABNORMAL LOW (ref 2.5–4.6)

## 2020-03-19 LAB — MAGNESIUM: Magnesium: 2.1 mg/dL (ref 1.7–2.4)

## 2020-03-19 LAB — VITAMIN D 25 HYDROXY (VIT D DEFICIENCY, FRACTURES): Vit D, 25-Hydroxy: 19.56 ng/mL — ABNORMAL LOW (ref 30–100)

## 2020-03-19 LAB — C-REACTIVE PROTEIN: CRP: 14.7 mg/dL — ABNORMAL HIGH (ref <1.0)

## 2020-03-19 MED ORDER — SODIUM PHOSPHATES 45 MMOLE/15ML IV SOLN
30.0000 mmol | Freq: Once | INTRAVENOUS | Status: AC
  Administered 2020-03-19: 30 mmol via INTRAVENOUS
  Filled 2020-03-19: qty 10

## 2020-03-19 NOTE — Evaluation (Signed)
Occupational Therapy Evaluation Patient Details Name: Spencer [name] MRN: [id number] DOB: Sep 08, 1961 Today's Date: 03/19/2020    History of Present Illness Pt admitted for MSSA bactermia and elevated BG. Pt with recent admission for similar issues, left AMA. HIstory includes DM, cocaine abuse, brain aneurysm, HTN, and GERD. Last admission with + C4 fx with Aspen collar in place.   Clinical Impression   Mr Lazalde was seen for OT evaluation this date. Prior to hospital admission, pt was MOD I for ADLs and mobility using AD intermittently. Pt unable to state home setup and appears uncertain where he will d/c to. Upon entry to room, pt found on toilet c non-primary RN providing supervision (RN states pt disregarded bed alarm and ambulated to bathroom, uncertain whether pt wearing c-collar). Pt found on toilet not wearing Aspen collar and requiring assist to stand, pt refused to wait until OT located Aspen collar. MIN A + L rail + 1 HHA sit>stand at commode - pt refused RW use. SUPERVISION toileting at standard commode. CGA +1 HHA for clothing management in standing at commode. Pt presents to acute OT demonstrating impaired ADL performance and functional mobility 2/2 decreased safety awareness, functional balance/strength deficits, and decreased activity tolerance. OT provided education on importance of c-collar prior to mobility and of supervision for OOB mobility. Pt would benefit from skilled OT to address noted impairments and functional limitations (see below for any additional details) in order to maximize safety and independence while minimizing falls risk and caregiver burden. Upon hospital discharge, recommend HHOT to maximize pt safety and return to functional independence during meaningful occupations of daily life.      Follow Up Recommendations  Home health OT;Supervision/Assistance - 24 hour    Equipment Recommendations       Recommendations for Other Services       Precautions /  Restrictions Precautions Precautions: Fall Required Braces or Orthoses: Cervical Brace Cervical Brace: Hard collar Restrictions Weight Bearing Restrictions: No      Mobility Bed Mobility Overal bed mobility: Needs Assistance Bed Mobility: Sit to Supine       Sit to supine: Min guard      Transfers Overall transfer level: Needs assistance Equipment used: 1 person hand held assist Transfers: Sit to/from Stand Sit to Stand: Min assist         General transfer comment: MIN A + L rail + 1 HHA sit>stand at commode - pt refused RW use     Balance Overall balance assessment: History of Falls;Needs assistance Sitting-balance support: Single extremity supported;Feet supported Sitting balance-Leahy Scale: Good     Standing balance support: Single extremity supported Standing balance-Leahy Scale: Fair                             ADL either performed or assessed with clinical judgement   ADL Overall ADL's : Needs assistance/impaired                                       General ADL Comments: SUPERVISION toileting at commode. CGA +1 HHA clothing management in standing at commode     Vision         Perception     Praxis      Pertinent Vitals/Pain Pain Assessment: Faces Faces Pain Scale: Hurts little more Pain Location: buttocks and posterior neck Pain Descriptors / Indicators: Aching;Discomfort;Dull Pain  Intervention(s): Limited activity within patient's tolerance     Hand Dominance Right   Extremity/Trunk Assessment Upper Extremity Assessment Upper Extremity Assessment: Overall WFL for tasks assessed   Lower Extremity Assessment Lower Extremity Assessment: Generalized weakness       Communication Communication Communication: No difficulties   Cognition Arousal/Alertness: Awake/alert Behavior During Therapy: Agitated Overall Cognitive Status: Within Functional Limits for tasks assessed                                  General Comments: Pt easily agitated by OT questions and attempt to provide care   General Comments       Exercises Exercises: Other exercises Other Exercises Other Exercises: Pt educated re: falls prevention, c-spine pcns, importance of c-collar prior to mobility Other Exercises: Toileting, sit<>stand, sit>sup, bed mobility, LBD   Shoulder Instructions      Home Living Family/patient expects to be discharged to:: Private residence Living Arrangements: (Pt vague about support system ) Available Help at Discharge: Friend(s) Type of Home: House                           Additional Comments: Pt does not answer home setup or d/c assist questions      Prior Functioning/Environment Level of Independence: Independent with assistive device(s)        Comments: reports he was "running around" before hospital admission        OT Problem List: Decreased strength;Decreased range of motion;Decreased activity tolerance;Decreased safety awareness;Decreased knowledge of use of DME or AE;Decreased knowledge of precautions;Impaired balance (sitting and/or standing)      OT Treatment/Interventions: Self-care/ADL training;Therapeutic exercise;Neuromuscular education;Energy conservation;DME and/or AE instruction;Therapeutic activities;Patient/family education;Balance training    OT Goals(Current goals can be found in the care plan section) Acute Rehab OT Goals Patient Stated Goal: To go home OT Goal Formulation: With patient Time For Goal Achievement: 04/02/20 Potential to Achieve Goals: Fair ADL Goals Pt Will Perform Lower Body Dressing: sit to/from stand;with supervision(c LRAD PRN) Pt Will Transfer to Toilet: with supervision;ambulating;regular height toilet(c LRAD PRN and no cues for c-spine pcns) Pt Will Perform Toileting - Clothing Manipulation and hygiene: with supervision;sit to/from stand(c LRAD PRN) Additional ADL Goal #1: Pt will Independently verbalize x3  falls prevention strategeis  OT Frequency: Min 1X/week   Barriers to D/C: Inaccessible home environment;Decreased caregiver support          Co-evaluation              AM-PAC OT "6 Clicks" Daily Activity     Outcome Measure Help from another person eating meals?: A Little Help from another person taking care of personal grooming?: A Little Help from another person toileting, which includes using toliet, bedpan, or urinal?: A Little Help from another person bathing (including washing, rinsing, drying)?: A Lot Help from another person to put on and taking off regular upper body clothing?: A Little Help from another person to put on and taking off regular lower body clothing?: A Little 6 Click Score: 17   End of Session Equipment Utilized During Treatment: Cervical collar  Activity Tolerance: Patient limited by pain Patient left: in bed;with call bell/phone within reach;with bed alarm set  OT Visit Diagnosis: Unsteadiness on feet (R26.81);Other abnormalities of gait and mobility (R26.89)                Time: 1607-3710 OT Time Calculation (min): 9  min Charges:  OT General Charges $OT Visit: 1 Visit OT Evaluation $OT Eval Low Complexity: 1 Low  Kathie Dike, M.S. OTR/L  03/19/20, 9:44 AM

## 2020-03-19 NOTE — Progress Notes (Signed)
Pt removed both PIV about 10 minutes prior to the change of shift. Pt is agitated and wants to leave. Pt report he will wait on MD this time but he needs to go. Pass on information to the oncoming nurse.

## 2020-03-19 NOTE — Progress Notes (Signed)
PROGRESS NOTE    Spencer [name]  WIO:[id number] DOB: [birth year] DOA: 03/13/2020 PCP: Patient, No Pcp Per   Brief Narrative:  Spencer [name] is a 59 y.o. male with medical history significant for type 2 diabetes mellitus, cocaine abuse,brain aneurysm,hypertension, GERD.  Patient was recently admitted on 03/03/20 for DKA, acute renal failure and patient signed out against medical advice on 03/10/2020.  Patient brought in by EMS.  Patient is not sure why he is here.  As per him his sister called EMS.  Patient states his blood glucose levels were very high.  When asked how compliant is he with his insulin he told me that he is taking his insulin but he does have a history of noncompliance.  He denies any chest pain, shortness of breath.  Denies any abdominal pain, nausea or any vomitings.  No fevers or chills.  Denies any diarrhea.  Does complain of constipation.  Does state that he has not been able to urinate much.    Assessment & Plan:   Principal Problem:   MSSA bacteremia Active Problems:   DKA (diabetic ketoacidoses) ([place])   Hypertension   Diabetic neuropathy (HCC)   DM2 (diabetes mellitus, type 2) ([place])   History of cocaine abuse ([place])   Personal history of subdural hematoma   AKI (acute kidney injury) ([place])   GERD (gastroesophageal reflux disease)   C4 cervical fracture (HCC)   Cigarette smoker   Pressure injury of skin    Acute diabetic ketoacidosis in a patient with type 2 diabetes mellitus, POA, resolved Acute metabolic acidosis secondary to DKA, POA, resolved -Anion gap of 30, serum bicarb level of 8 on admission -Patient was given 1 L of LR in the ED ER.   Was given another 2 L of normal saline bolus then placement 150 cc of normal saline per hour. -Was given insulin bolus, initiated on insulin drip in the ER as per the DKA protocol -Anion gap closed -Sugars improved Plan: Continue subcutaneous insulin regimen Carb modified diet CBG before meals and at bedtime Diabetes  coordinator consult Patient adherence to insulin regimen and treatment protocol is greatly in question  MSSA bacteremia Patient was noted to have MSSA bacteremia on previous admission.   At that time he left AGAINST MEDICAL ADVICE.   He was recommended for TEE for evaluation endocarditis.  However he left AMA before this could be performed.   s/p Ancef, cardiology consult for TEE, but deferred secondary to patient's overall situation.   5/10 Repeat blood cultures NGTD.   MRI C-spine: IMPRESSION: 1. Severely motion degraded examination. 2. No evidence of discitis-osteomyelitis, though assessment is limited to a single sagittal STIR sequence. 3. Prevertebral effusion extending the length of the cervical spine,10 mm in thickness. Plan:  5/12 started nafcillin 12 g IV daily ID following, Rec.6 weeks of IV antibiotics, patient will possibly need placement Psych consulted, recommended Haldol for agitation, patient seems to understand his condition and he realized that he is unable to take care of himself. WBC 16.4, continue to trend ESR 129, CRP 14.7  Acute kidney injury secondary to dehydration, POA. Resolved s/p IVF -Avoid nephrotoxic agents -Monitor input and output   Mild hyperkalemia due to AKI, POA, Resolved Hypokalemia, potassium repleted.  Continue to monitor.  Hypertension, benign essential - Continue amlodipine - Restart losartan  Metabolic encephalopathy. Resolved  Patient is awake and alert now  Possibly secondary to substance abuse, DKA, MSSA bacteremia Continue to monitor mental status  C4 fracture Was evaluated by  NSG previous admission Recommended Aspen collar at all times, which patient has not been compliant with Aspen collar.  Aspen collar has been ordered from materials. Treat with Robaxin Avoid narcotics Per previous consult note by neurosurgery patient has been cleared for TEE but must wear Aspen collar for the duration of the procedure. Apply ice pack to  decrease edema   PT OT eval for placement  DVT prophylaxis: Lovenox Code Status: Full Family Communication: None today  disposition Plan: Status is: Inpatient  Remains inpatient appropriate because:Inpatient level of care appropriate due to severity of illness   Dispo: The patient is from: Home              Anticipated d/c is to: Home              Anticipated d/c date is: 2 days              Patient currently is not medically stable to d/c.  MSSA bacteremia on IV antibiotics.  Resolved DKA.  ID recommended IV antibiotics for 6 weeks, patient will probably need placement    Consultants:   ID  Cardiology  Procedures:   2D echo  Antimicrobials:   Cefazolin, 03/14/2020-  Nafcillin 03/17/20--   Subjective: No overnight issues, patient was seen and examined at bedside.  Complaining of pain in the neck and bilateral legs.  Pain in the neck 8- 9/10.  Patient denied any other active issues.    Objective: Vitals:   03/18/20 1652 03/18/20 2340 03/19/20 0748 03/19/20 1128  BP: 110/68 122/79 (!) 141/85 128/80  Pulse: 88 91 95 100  Resp: _0 Temp: 98.5 F (36.9 C) 98.2 F (36.8 C) 98.2 F (36.8 C) 98.5 F (36.9 C)  TempSrc: Oral  Oral Oral  SpO2: 98% 100% 100% 97%  Weight:      Height:        Intake/Output Summary (Last 24 hours) at 03/19/2020 1152 Last data filed at 03/19/2020 1022 Gross per 24 hour  Intake 480 ml  Output 950 ml  Net -470 ml   Filed Weights   03/13/20 1654 03/13/20 2055  Weight: 59 kg 60.3 kg    Examination:  General exam: Lethargic, appears disheveled, chronically ill Respiratory system: Poor respiratory effort, bibasilar crackles Cardiovascular system: Tachycardic, no murmurs, no pedal edema  gastrointestinal system: Nondistended, nontender, normal bowel sounds Central nervous system: Lethargic, unable to fully assess, no obvious deficits Extremities: Unable to assess Skin: No rashes, lesions or ulcers Psychiatry:  Irritable    Data Reviewed: I have personally reviewed following labs and imaging studies  CBC: Recent Labs  Lab 03/13/20 1659 03/18/20 0439 03/19/20 0518  WBC 32.4* 16.4* 16.4*  NEUTROABS 30.6*  --   --   HGB 11.9* 9.7* 9.4*  HCT 38.7* 28.6* 27.1*  MCV 100.8* 88.8 88.9  PLT 765* 369 952*   Basic Metabolic Panel: Recent Labs  Lab 03/13/20 1659 03/13/20 2114 03/15/20 0655 03/16/20 0443 03/17/20 0524 03/17/20 0624 03/18/20 0439 03/19/20 0518  NA 139   < > 137 132* 133*  --  135 135  K 5.4*   < > 3.4* 4.0 3.6  --  3.2* 4.1  CL 101   < > 104 98 98  --  100 102  CO2 8*   < > _1 --  28 26  GLUCOSE 637*   < > 329* 445* 364*  --  281* 274*  BUN 54*   < > 17  22 25*  --  25* 24*  CREATININE 1.89*   < > 0.64 0.71 0.77  --  0.58* 0.60*  CALCIUM 9.6   < > 8.6* 8.4* 8.3*  --  8.1* 8.1*  MG 3.1*  --   --   --   --  1.8 2.1 2.1  PHOS 6.9*  --   --   --   --  2.5 2.9 2.1*   < > = values in this interval not displayed.   GFR: Estimated Creatinine Clearance: 82.7 mL/min (A) (by C-G formula based on SCr of 0.6 mg/dL (L)). Liver Function Tests: Recent Labs  Lab 03/13/20 1659  AST 16  ALT 18  ALKPHOS 141*  BILITOT 3.0*  PROT 7.5  ALBUMIN 2.9*   Recent Labs  Lab 03/13/20 1659  LIPASE 13   No results for input(s): AMMONIA in the last 168 hours. Coagulation Profile: No results for input(s): INR, PROTIME in the last 168 hours. Cardiac Enzymes: No results for input(s): CKTOTAL, CKMB, CKMBINDEX, TROPONINI in the last 168 hours. BNP (last 3 results) No results for input(s): PROBNP in the last 8760 hours. HbA1C: No results for input(s): HGBA1C in the last 72 hours. CBG: Recent Labs  Lab 03/18/20 0758 03/18/20 1139 03/18/20 1653 03/18/20 2103 03/19/20 1130  GLUCAP 350* 268* 148* 154* 391*   Lipid Profile: No results for input(s): CHOL, HDL, LDLCALC, TRIG, CHOLHDL, LDLDIRECT in the last 72 hours. Thyroid Function Tests: No results for input(s): TSH, T4TOTAL,  FREET4, T3FREE, THYROIDAB in the last 72 hours. Anemia Panel: No results for input(s): VITAMINB12, FOLATE, FERRITIN, TIBC, IRON, RETICCTPCT in the last 72 hours. Sepsis Labs: No results for input(s): PROCALCITON, LATICACIDVEN in the last 168 hours.  Recent Results (from the past 240 hour(s))  Respiratory Panel by RT PCR (Flu A&B, Covid) - Nasopharyngeal Swab     Status: None   Collection Time: 03/13/20  7:33 PM   Specimen: Nasopharyngeal Swab  Result Value Ref Range Status   SARS Coronavirus 2 by RT PCR NEGATIVE NEGATIVE Final    Comment: (NOTE) SARS-CoV-2 target nucleic acids are NOT DETECTED. The SARS-CoV-2 RNA is generally detectable in upper respiratoy specimens during the acute phase of infection. The lowest concentration of SARS-CoV-2 viral copies this assay can detect is 131 copies/mL. A negative result does not preclude SARS-Cov-2 infection and should not be used as the sole basis for treatment or other patient management decisions. A negative result may occur with  improper specimen collection/handling, submission of specimen other than nasopharyngeal swab, presence of viral mutation(s) within the areas targeted by this assay, and inadequate number of viral copies (<131 copies/mL). A negative result must be combined with clinical observations, patient history, and epidemiological information. The expected result is Negative. Fact Sheet for Patients:  PinkCheek.be Fact Sheet for Healthcare Providers:  GravelBags.it This test is not yet ap proved or cleared by the [place] FDA and  has been authorized for detection and/or diagnosis of SARS-CoV-2 by FDA under an Emergency Use Authorization (EUA). This EUA will remain  in effect (meaning this test can be used) for the duration of the COVID-19 declaration under Section 564(b)(1) of the Act, 21 U.S.C. section 360bbb-3(b)(1), unless the authorization is terminated  or revoked sooner.    Influenza A by PCR NEGATIVE NEGATIVE Final   Influenza B by PCR NEGATIVE NEGATIVE Final    Comment: (NOTE) The Xpert Xpress SARS-CoV-2/FLU/RSV assay is intended as an aid in  the diagnosis of influenza from Nasopharyngeal swab  specimens and  should not be used as a sole basis for treatment. Nasal washings and  aspirates are unacceptable for Xpert Xpress SARS-CoV-2/FLU/RSV  testing. Fact Sheet for Patients: PinkCheek.be Fact Sheet for Healthcare Providers: GravelBags.it This test is not yet approved or cleared by the [place] FDA and  has been authorized for detection and/or diagnosis of SARS-CoV-2 by  FDA under an Emergency Use Authorization (EUA). This EUA will remain  in effect (meaning this test can be used) for the duration of the  Covid-19 declaration under Section 564(b)(1) of the Act, 21  U.S.C. section 360bbb-3(b)(1), unless the authorization is  terminated or revoked. Performed at [facility], [address] [place], [place] [address]   Blood culture (routine x 2)     Status: Abnormal   Collection Time: 03/13/20  7:33 PM   Specimen: BLOOD  Result Value Ref Range Status   Specimen Description   Final    BLOOD LEFT HAND Performed at [facility], [address] [place], [place] [address]    Special Requests   Final    BOTTLES DRAWN AEROBIC AND ANAEROBIC Blood Culture results may not be optimal due to an inadequate volume of blood received in culture bottles Performed at [facility], [address], [place], [place] [address]    Culture  Setup Time   Final    GRAM POSITIVE COCCI IN BOTH AEROBIC AND ANAEROBIC BOTTLES CRITICAL RESULT CALLED TO, READ BACK BY AND VERIFIED WITH: [facility] HALLAJI AT [id number] 03/14/20.PMF Performed at [facility], [place] [address], [place], [place] [address]    Culture STAPHYLOCOCCUS AUREUS (A)  Final   Report Status 03/16/2020  FINAL  Final   Organism ID, Bacteria STAPHYLOCOCCUS AUREUS  Final      Susceptibility   Staphylococcus aureus - MIC*    CIPROFLOXACIN <=0.5 SENSITIVE Sensitive     ERYTHROMYCIN >=8 RESISTANT Resistant     GENTAMICIN <=0.5 SENSITIVE Sensitive     OXACILLIN 0.5 SENSITIVE Sensitive     TETRACYCLINE <=1 SENSITIVE Sensitive     VANCOMYCIN 1 SENSITIVE Sensitive     TRIMETH/SULFA <=10 SENSITIVE Sensitive     CLINDAMYCIN RESISTANT Resistant     RIFAMPIN <=0.5 SENSITIVE Sensitive     Inducible Clindamycin POSITIVE Resistant     * STAPHYLOCOCCUS AUREUS  Blood culture (routine x 2)     Status: Abnormal   Collection Time: 03/13/20  7:33 PM   Specimen: BLOOD  Result Value Ref Range Status   Specimen Description   Final    BLOOD LEFT AC Performed at [facility], [address], [place], [place] [address]    Special Requests   Final    BOTTLES DRAWN AEROBIC AND ANAEROBIC Blood Culture results may not be optimal due to an inadequate volume of blood received in culture bottles Performed at [facility], [address] [place], [place] [address]    Culture  Setup Time   Final    GRAM POSITIVE COCCI IN BOTH AEROBIC AND ANAEROBIC BOTTLES CRITICAL RESULT CALLED TO, READ BACK BY AND VERIFIED WITH: [facility] HALLAJI AT 8850 03/14/20.PMF Performed at [facility], [address] [place], [place] [address]    Culture (A)  Final    STAPHYLOCOCCUS AUREUS SUSCEPTIBILITIES PERFORMED ON PREVIOUS CULTURE WITHIN THE LAST 5 DAYS. Performed at [facility], [place] [address] [place], [place] [address]    Report Status 03/16/2020 FINAL  Final  Blood Culture ID Panel (Reflexed)  Status: Abnormal   Collection Time: 03/13/20  7:33 PM  Result Value Ref Range Status   Enterococcus species NOT DETECTED NOT DETECTED Final   Listeria monocytogenes NOT DETECTED NOT DETECTED Final   Staphylococcus species DETECTED (A) NOT DETECTED Final    Comment: CRITICAL RESULT CALLED TO, READ  BACK BY AND VERIFIED WITH: [facility] HALLAJI AT 6759 03/14/20.PMF    Staphylococcus aureus (BCID) DETECTED (A) NOT DETECTED Final    Comment: Methicillin (oxacillin) susceptible Staphylococcus aureus (MSSA). Preferred therapy is anti staphylococcal beta lactam antibiotic (Cefazolin or Nafcillin), unless clinically contraindicated. CRITICAL RESULT CALLED TO, READ BACK BY AND VERIFIED WITH: [facility] HALLAJI AT 1638 03/14/20.PMF    Methicillin resistance NOT DETECTED NOT DETECTED Final   Streptococcus species NOT DETECTED NOT DETECTED Final   Streptococcus agalactiae NOT DETECTED NOT DETECTED Final   Streptococcus pneumoniae NOT DETECTED NOT DETECTED Final   Streptococcus pyogenes NOT DETECTED NOT DETECTED Final   Acinetobacter baumannii NOT DETECTED NOT DETECTED Final   Enterobacteriaceae species NOT DETECTED NOT DETECTED Final   Enterobacter cloacae complex NOT DETECTED NOT DETECTED Final   Escherichia coli NOT DETECTED NOT DETECTED Final   Klebsiella oxytoca NOT DETECTED NOT DETECTED Final   Klebsiella pneumoniae NOT DETECTED NOT DETECTED Final   Proteus species NOT DETECTED NOT DETECTED Final   Serratia marcescens NOT DETECTED NOT DETECTED Final   Haemophilus influenzae NOT DETECTED NOT DETECTED Final   Neisseria meningitidis NOT DETECTED NOT DETECTED Final   Pseudomonas aeruginosa NOT DETECTED NOT DETECTED Final   Candida albicans NOT DETECTED NOT DETECTED Final   Candida glabrata NOT DETECTED NOT DETECTED Final   Candida krusei NOT DETECTED NOT DETECTED Final   Candida parapsilosis NOT DETECTED NOT DETECTED Final   Candida tropicalis NOT DETECTED NOT DETECTED Final    Comment: Performed at [facility], [address] [place], [place] [address]  Culture, blood (routine x 2)     Status: None (Preliminary result)   Collection Time: 03/15/20  6:55 AM   Specimen: BLOOD LEFT HAND  Result Value Ref Range Status   Specimen Description BLOOD LEFT HAND  Final   Special Requests   Final     BOTTLES DRAWN AEROBIC AND ANAEROBIC Blood Culture adequate volume   Culture   Final    NO GROWTH 4 DAYS Performed at [facility], [address] [place], [place] [address]    Report Status PENDING  Incomplete  Culture, blood (routine x 2)     Status: None (Preliminary result)   Collection Time: 03/15/20  6:55 AM   Specimen: BLOOD LEFT FOREARM  Result Value Ref Range Status   Specimen Description BLOOD LEFT FOREARM  Final   Special Requests   Final    BOTTLES DRAWN AEROBIC AND ANAEROBIC Blood Culture adequate volume   Culture   Final    NO GROWTH 4 DAYS Performed at [facility], [address] [place],  [address]    Report Status PENDING  Incomplete         Radiology Studies: MR CERVICAL SPINE W WO CONTRAST  Result Date: 03/17/2020 CLINICAL DATA:  Cervical spine fracture. MSSA bacteremia EXAM: MRI CERVICAL SPINE WITHOUT AND WITH CONTRAST TECHNIQUE: Multiplanar and multiecho pulse sequences of the cervical spine, to include the craniocervical junction and cervicothoracic junction, were obtained without and with intravenous contrast. CONTRAST:  81m GADAVIST GADOBUTROL 1 MMOL/ML IV SOLN COMPARISON:  CT cervical spine 03/05/2020 FINDINGS: The examination is severely degraded by motion. There is an intermediate sized  prevertebral effusion that extends the length of the cervical spine and measures 10 mm in thickness. I do not see evidence of discitis-osteomyelitis on the single sagittal series that is not terribly motion degraded (series 12). IMPRESSION: 1. Severely motion degraded examination. 2. No evidence of discitis-osteomyelitis, though assessment is limited to a single sagittal STIR sequence. 3. Prevertebral effusion extending the length of the cervical spine, 10 mm in thickness. Electronically Signed   By: Ulyses Jarred M.D.   On: 03/17/2020 23:37        Scheduled Meds: . amLODipine  10 mg Oral Daily  . aspirin EC  81 mg Oral Daily  .  Chlorhexidine Gluconate Cloth  6 each Topical Daily  . enoxaparin (LOVENOX) injection  40 mg Subcutaneous Q24H  . famotidine  20 mg Oral Daily  . insulin aspart  0-15 Units Subcutaneous TID WC  . insulin aspart  0-5 Units Subcutaneous QHS  . insulin aspart  10 Units Subcutaneous TID WC  . insulin detemir  30 Units Subcutaneous Q24H  . losartan  50 mg Oral Daily  . methocarbamol  500 mg Oral TID  . multivitamin with minerals  1 tablet Oral Daily  . polyethylene glycol  17 g Oral Daily  . Ensure Max Protein  11 oz Oral BID BM   Continuous Infusions: . sodium chloride Stopped (03/16/20 0800)  . nafcillin (NAFCIL) continuous infusion 12 g (03/19/20 1047)  . sodium phosphate  Dextrose 5% IVPB 30 mmol (03/19/20 1054)     LOS: 6 days    Time spent: 35 minutes    Val Riles, MD [facility] Pager [phone]  If 7PM-7AM, please contact night-coverage 03/19/2020, 11:52 AM

## 2020-03-20 DIAGNOSIS — S12300D Unspecified displaced fracture of fourth cervical vertebra, subsequent encounter for fracture with routine healing: Secondary | ICD-10-CM

## 2020-03-20 DIAGNOSIS — E1149 Type 2 diabetes mellitus with other diabetic neurological complication: Secondary | ICD-10-CM

## 2020-03-20 DIAGNOSIS — R7881 Bacteremia: Secondary | ICD-10-CM

## 2020-03-20 DIAGNOSIS — B9561 Methicillin susceptible Staphylococcus aureus infection as the cause of diseases classified elsewhere: Secondary | ICD-10-CM

## 2020-03-20 LAB — BASIC METABOLIC PANEL
Anion gap: 5 (ref 5–15)
BUN: 25 mg/dL — ABNORMAL HIGH (ref 8–23)
CO2: 26 mmol/L (ref 22–32)
Calcium: 7.8 mg/dL — ABNORMAL LOW (ref 8.9–10.3)
Chloride: 103 mmol/L (ref 98–111)
Creatinine, Ser: 0.62 mg/dL (ref 0.61–1.24)
GFR calc Af Amer: >60 mL/min (ref >60)
GFR calc non Af Amer: >60 mL/min (ref >60)
Glucose, Bld: 229 mg/dL — ABNORMAL HIGH (ref 70–99)
Potassium: 3.8 mmol/L (ref 3.5–5.1)
Sodium: 134 mmol/L — ABNORMAL LOW (ref 135–145)

## 2020-03-20 LAB — CBC
HCT: 25.5 % — ABNORMAL LOW (ref 39.0–52.0)
Hemoglobin: 8.7 g/dL — ABNORMAL LOW (ref 13.0–17.0)
MCH: 30.3 pg (ref 26.0–34.0)
MCHC: 34.1 g/dL (ref 30.0–36.0)
MCV: 88.9 fL (ref 80.0–100.0)
Platelets: 477 10*3/uL — ABNORMAL HIGH (ref 150–400)
RBC: 2.87 MIL/uL — ABNORMAL LOW (ref 4.22–5.81)
RDW: 13.9 % (ref 11.5–15.5)
WBC: 11.9 10*3/uL — ABNORMAL HIGH (ref 4.0–10.5)
nRBC: 0 % (ref 0.0–0.2)

## 2020-03-20 LAB — GLUCOSE, CAPILLARY
Glucose-Capillary: 336 mg/dL — ABNORMAL HIGH (ref 70–99)
Glucose-Capillary: 357 mg/dL — ABNORMAL HIGH (ref 70–99)
Glucose-Capillary: 187 mg/dL — ABNORMAL HIGH (ref 70–99)
Glucose-Capillary: 60 mg/dL — ABNORMAL LOW (ref 70–99)
Glucose-Capillary: 173 mg/dL — ABNORMAL HIGH (ref 70–99)

## 2020-03-20 LAB — CULTURE, BLOOD (ROUTINE X 2)
Culture: NO GROWTH
Culture: NO GROWTH
Special Requests: ADEQUATE
Special Requests: ADEQUATE

## 2020-03-20 LAB — PHOSPHORUS: Phosphorus: 2.6 mg/dL (ref 2.5–4.6)

## 2020-03-20 LAB — MAGNESIUM: Magnesium: 2.2 mg/dL (ref 1.7–2.4)

## 2020-03-20 MED ORDER — INSULIN DETEMIR 100 UNIT/ML ~~LOC~~ SOLN
35.0000 [IU] | SUBCUTANEOUS | Status: DC
  Administered 2020-03-21: 35 [IU] via SUBCUTANEOUS
  Filled 2020-03-20 (×2): qty 0.35

## 2020-03-20 NOTE — Consult Note (Signed)
  Spencer [name], 59 y.o., male patient seen for face to face reassessment for medical decision making capacity by this provider; chart reviewed and consulted with Dr. Lucianne Muss on 03/20/20.  On evaluation Spencer [name] symptomatology and presentation remain consistent with previous psychiatric evaluations during the hospital stay.  He is unable to states why he is here and cannot state any of his medical concerns.  The proposed inpatient care is discussed with him but he demonstrates difficulty in restating this information.  He is unable to state the consequences of refusing hospital care or leaving the hospital AMA so this is reviewed in detail with him. After review with the patient, he states, "I will just stay here and get care".    During evaluation Spencer [name] is laying in a slouched position in the hospital bed; He is alert/oriented x 4; calm/cooperative; and mood congruent with affect.  Patient's speech is difficult to understand as he mumbles most of his responses.  He does not make good eye contact.  His thought process is disinterested and apathetic; There is no indication that he is currently responding to internal/external stimuli or experiencing delusional thought content.  Patient denies suicidal/self-harm/homicidal ideation, psychosis, and paranoia.  Patient demonstrates irritability but agreeable to assessment by mental health clinician.      Recommendations: Patient who was admitted with inability to care for himself, continues to demonstrate impaired medical decision making capacity.  He initially requesting discharge home, now agrees to continue inpatient care for medical concerns.  Patient does not meet decision making capacity: recommend contacting next of kin or ethics committee for continued treatment planning.
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
## 2020-03-20 NOTE — Progress Notes (Signed)
Physical Therapy Treatment Patient Details Name: Spencer [name] MRN: [id number] DOB: 30-Jan-1961 Today's Date: 03/20/2020    History of Present Illness Pt admitted for MSSA bactermia and elevated BG. Pt with recent admission for similar issues, left AMA. HIstory includes DM, cocaine abuse, brain aneurysm, HTN, and GERD. Last admission with + C4 fx with Aspen collar in place.    PT Comments    Patient appeared to be napping upon arrival. Gently woke patient and invited him to participate in PT by going for a walk. Patient states he will do whatever PT wants. Repeatedly agreed to move to edge of bed but made no attempt to initiate movement until multiple attempts to assist him and encourage him to move were made. Patient became increasingly agitated and was upset when PT requested he attempt to move himself to the edge of the bed when he reached out his UE. Eventually came to edge of bed with MinA from PT. c-collar donned at this point dependent on assistance. Patient continued to become more agitated and asked "what is wrong with you?" when it was explained that the next step was standing and walking. He stated "I need to take a nap" and moved himself back into the bed independently to a position too far down the bed for it to be appropriate for spine health to sit him up to eat the dinner that had just arrived and he stated he wanted. Patient unable to intiate movement to slide up the bed after repeated attempts to help position him and encourage him to move including making the bed completely flat. Obtained help from nursing for [facility] scoot up bed with draw sheet. Pt crossed arms without cuing and waited to be moved. Positioned patient in appropriate position for eating and placed dinner in front of him where he continued to swear as he opened the plastic wrapped flatware. Patient did not make much progress towards goals today and refused most attempts to assist him with mobility. Patient would benefit from  continued skilled physical therapy to address remaining impairments and functional limitations to work towards stated goals and return to PLOF or maximal functional independence.       Follow Up Recommendations  Home health PT;Supervision for mobility/OOB     Equipment Recommendations  Rolling walker with 5" wheels    Recommendations for Other Services       Precautions / Restrictions Precautions Precautions: Fall Required Braces or Orthoses: Cervical Brace Cervical Brace: Hard collar Restrictions Weight Bearing Restrictions: No    Mobility  Bed Mobility Overal bed mobility: Needs Assistance Bed Mobility: Sit to Supine;Supine to Sit     Supine to sit: Min assist Sit to supine: Supervision   General bed mobility comments: patient would not initiate movement after multiple attempts to ask him to get up. Kept stating he could/would do it and resisting attempts to assist, but then made no attempt to move. Eventually reached out to get to edge of bed and was upset when PT asked him to attempt on his own. Eventually came supine to sit with min A from PT. Moved sit to supine independentlen but then would not scoot up in bed despite saying he could. Did not initate any movement. Eventually obtained nursing assist to slide him up in bed with draw sheet. Pt almost reflexively crossed arms over chest and waited to be pulled up in bed.  Transfers Overall transfer level: Needs assistance  General transfer comment: Pt refused to attempt stand.  Ambulation/Gait         Gait velocity: refused to attempt ambulation       Stairs             Wheelchair Mobility    Modified Rankin (Stroke Patients Only)       Balance Overall balance assessment: History of Falls;Needs assistance Sitting-balance support: Single extremity supported;Feet supported Sitting balance-Leahy Scale: Good         Standing balance comment: refused to attempt standing today                             Cognition Arousal/Alertness: Awake/alert Behavior During Therapy: Agitated(many explatives. "what's wrong with you!" when attempting to assist him to get up) Overall Cognitive Status: Within Functional Limits for tasks assessed                                 General Comments: patient ageitated by PT's attempts to provide care. Originally said he would do whatever PT wants after invited to walk, then said he would not get up and needed to take a nap.      Exercises Other Exercises Other Exercises: Spent considerable time educating patient on improtance of movement and trying different encouragement techniques to help him participate in PT. Donned c-collar for him. Patient became more and more resistant to attempts to provide treatment. No evidence of learning.    General Comments        Pertinent Vitals/Pain Pain Assessment: Faces Faces Pain Scale: Hurts a little bit Pain Location: rubs R lateral thigh and groans but denies pain when asked Pain Descriptors / Indicators: Grimacing Pain Intervention(s): Limited activity within patient's tolerance;Monitored during session;Repositioned    Home Living                      Prior Function            PT Goals (current goals can now be found in the care plan section) Acute Rehab PT Goals Patient Stated Goal: To go home Progress towards PT goals: Not progressing toward goals - comment(patient resisting treatment this session)    Frequency    Min 2X/week      PT Plan Current plan remains appropriate    Co-evaluation              AM-PAC PT "6 Clicks" Mobility   Outcome Measure  Help needed turning from your back to your side while in a flat bed without using bedrails?: A Little Help needed moving from lying on your back to sitting on the side of a flat bed without using bedrails?: A Little Help needed moving to and from a bed to a chair (including a  wheelchair)?: A Little Help needed standing up from a chair using your arms (e.g., wheelchair or bedside chair)?: A Little Help needed to walk in hospital room?: A Little Help needed climbing 3-5 steps with a railing? : A Little 6 Click Score: 18    End of Session Equipment Utilized During Treatment: Cervical collar Activity Tolerance: Treatment limited secondary to agitation Patient left: in bed;with bed alarm set;with call bell/phone within reach Nurse Communication: Mobility status(requested help to slide pt up in bed) PT Visit Diagnosis: History of falling (Z91.81);Difficulty in walking, not elsewhere classified (R26.2);Pain Pain - Right/Left: Right Pain - part  of body: Hip     Time: 7322-0254 PT Time Calculation (min) (ACUTE ONLY): 17 min  Charges:  $Therapeutic Activity: 8-22 mins                     Luretha Murphy. Ilsa Iha, PT, DPT 03/20/20, 4:57 PM

## 2020-03-20 NOTE — Progress Notes (Signed)
Pt BG was 60 mg/dL during HS med pass. Insulin held. Orange juice and crackers given, pt tolerated. BG rechecked it was 173 mg/dL. No acute deviations noted at this time. Will continue to monitor BG levels as per orders.

## 2020-03-21 DIAGNOSIS — E1165 Type 2 diabetes mellitus with hyperglycemia: Secondary | ICD-10-CM

## 2020-03-21 DIAGNOSIS — Z8679 Personal history of other diseases of the circulatory system: Secondary | ICD-10-CM

## 2020-03-21 DIAGNOSIS — Z794 Long term (current) use of insulin: Secondary | ICD-10-CM

## 2020-03-21 DIAGNOSIS — F1411 Cocaine abuse, in remission: Secondary | ICD-10-CM

## 2020-03-21 DIAGNOSIS — I10 Essential (primary) hypertension: Secondary | ICD-10-CM

## 2020-03-21 LAB — CBC
HCT: 27.6 % — ABNORMAL LOW (ref 39.0–52.0)
Hemoglobin: 8.9 g/dL — ABNORMAL LOW (ref 13.0–17.0)
MCH: 29.7 pg (ref 26.0–34.0)
MCHC: 32.2 g/dL (ref 30.0–36.0)
MCV: 92 fL (ref 80.0–100.0)
Platelets: 581 10*3/uL — ABNORMAL HIGH (ref 150–400)
RBC: 3 MIL/uL — ABNORMAL LOW (ref 4.22–5.81)
RDW: 14 % (ref 11.5–15.5)
WBC: 12.7 10*3/uL — ABNORMAL HIGH (ref 4.0–10.5)
nRBC: 0 % (ref 0.0–0.2)

## 2020-03-21 LAB — GLUCOSE, CAPILLARY
Glucose-Capillary: 330 mg/dL — ABNORMAL HIGH (ref 70–99)
Glucose-Capillary: 342 mg/dL — ABNORMAL HIGH (ref 70–99)
Glucose-Capillary: 348 mg/dL — ABNORMAL HIGH (ref 70–99)
Glucose-Capillary: 119 mg/dL — ABNORMAL HIGH (ref 70–99)
Glucose-Capillary: 45 mg/dL — ABNORMAL LOW (ref 70–99)
Glucose-Capillary: 114 mg/dL — ABNORMAL HIGH (ref 70–99)

## 2020-03-21 LAB — BASIC METABOLIC PANEL
Anion gap: 8 (ref 5–15)
BUN: 17 mg/dL (ref 8–23)
CO2: 25 mmol/L (ref 22–32)
Calcium: 7.7 mg/dL — ABNORMAL LOW (ref 8.9–10.3)
Chloride: 100 mmol/L (ref 98–111)
Creatinine, Ser: 0.54 mg/dL — ABNORMAL LOW (ref 0.61–1.24)
GFR calc Af Amer: >60 mL/min (ref >60)
GFR calc non Af Amer: >60 mL/min (ref >60)
Glucose, Bld: 263 mg/dL — ABNORMAL HIGH (ref 70–99)
Potassium: 4.1 mmol/L (ref 3.5–5.1)
Sodium: 133 mmol/L — ABNORMAL LOW (ref 135–145)

## 2020-03-21 NOTE — Progress Notes (Addendum)
[facility]  PROGRESS NOTE  Spencer [name] EAV:[id number] DOB: Apr 14, 1961 DOA: 03/13/2020 PCP: Patient, No Pcp Per Admit date - 03/13/2020   Admitting Physician Arelia Sneddon, MD  Outpatient Primary MD for the patient is Patient, No Pcp Per  LOS - 8 Brief Narrative   Spencer [name] is a 59 y.o. year old male with medical history significant for type 2 diabetes mellitus, cocaine abuse,recent fall with C4 anterior cervical fracture with neck collar in place, brain aneurysm,hypertension, GERD.Patient was recently admitted on 4/28/21for DKA, acute renal failure and patient signed out AGAINST MEDICAL ADVICE on 03/10/2020 who presented on 03/13/2020 with hyperglycemia in setting of poor adherence to insulin therapy and was found to have DKA with anion gap of 30, serum bicarb of eight on admission requiring initiation of insulin drip as well as fluids per DKA protocol.  Hospital course complicated by previous diagnosis of MSSA bacteremia for which he left AGAINST MEDICAL ADVICE before recommended TEE for evaluation of endocarditis on last hospital stay.  Was found to have persistent bacteremia on admitting blood culture in setting of not being on appropriate antibiotics due to leaving AGAINST MEDICAL ADVICE during previous hospital stay.  Infectious disease recommended continuing on nafcillin with high concern for potential endocarditis though TTE is unremarkable, and possible osteomyelitis given limited assessment on MRI C-spine. ID recommending 6 weeks of total IV antibiotics.  Patient initially declined TEE as recommended by cardiology, was evaluated by psychiatry who deemed patient does not have medical decision-making capacity Subjective  Today he denies any chest pain, no shortness of breath, no fever  A & P   Lack of medical decision-making capacity. Evaluated by psychiatry on 5/15 patient has impaired medical decision-making capacity, currently agreeable to continuing inpatient care  none -patient does not meet decision-making capacity, will consult next of kin or ethics committee for continued treatment planning  Type 2 diabetes, poorly controlled, A1c 12.6. Presented in DKA in setting of poor insulin adherence. FBG is elevated to the 330s this a.m. -Increased Levemir from 30 to 35 units (first dose 5/16) -NovoLog 10 units 3 times daily with meals (as well as eating greater than 50% of meals) -Sliding scale as needed with meals and nightly -Continue to monitor CBGs  MSSA bacteremia, recurrent in the setting of leaving AMA on previous hospital stay, admitting blood cultures on 5/8+ for MSSA.  TTE unremarkable, patient initially declined TEE on 5/13 discussed with cardiology, also some concern of moderate sedation, also high risk given cervical fracture. Patient does not have decision-making capacity. Remains afebrile and glucose is slightly elevated at 12.7 ( down from peak of 32.4). Repeat blood culture on 5/10 no growth x5 days -Discussed with family, and reassess with cardiology -Continue nafcillin for six total weeks of therapy per ID recommendations   Intermittent episodes of agitation, currently stable -Psych recommends low-dose Haldol 2 mg in the future  Minimally displaced C4 fracture. Has been instructed to wear Aspen collar at all times however has been declining it. Has no symptoms of cervical myelopathy -Neurosurgery previously evaluated 5/3, patient okay to undergo TEE as long as the Aspen collar remains in place during the entirety of the procedure -Discussed with nursing as collar does not fit well and is uncomfortable -Follow-up with orthotic supplier to ensure he has well fitting Aspen collar -Outpatient follow-up with neurosurgery clinic (christine Zdeb, NP)  AKI, prerenal etiology related to dehydration related to DKA on admission.  Peak creatinine of 1.89 on admission, improved with IV fluids.  Back at baseline currently  Cocaine abuse.  UDS positive  for cocaine on admission  Hypertension, stable -Continue home amlodipine -Held losartan on admission due to AKI   Family Communication  : Updated sister on the phone  Code Status : Full  Disposition Plan  :  Patient is from home. Anticipated d/c date: 2 to 3 days. Barriers to d/c or necessity for inpatient status:  Needs safe disposition as patient will need continued IV antibiotics and does not have the capacity to make medical decisions, will need to determine the patient able to go home with close family support versus facility Consults  : ID, cardiology, psychiatry  Procedures  : None  DVT Prophylaxis  :  Lovenox   Lab Results  Component Value Date   PLT 581 (H) 03/21/2020    Diet :  Diet Order            Diet Carb Modified Fluid consistency: Thin; Room service appropriate? Yes with Assist  Diet effective now               Inpatient Medications Scheduled Meds: . amLODipine  10 mg Oral Daily  . aspirin EC  81 mg Oral Daily  . Chlorhexidine Gluconate Cloth  6 each Topical Daily  . enoxaparin (LOVENOX) injection  40 mg Subcutaneous Q24H  . famotidine  20 mg Oral Daily  . insulin aspart  0-15 Units Subcutaneous TID WC  . insulin aspart  0-5 Units Subcutaneous QHS  . insulin aspart  10 Units Subcutaneous TID WC  . insulin detemir  35 Units Subcutaneous Q24H  . losartan  50 mg Oral Daily  . methocarbamol  500 mg Oral TID  . multivitamin with minerals  1 tablet Oral Daily  . polyethylene glycol  17 g Oral Daily  . Ensure Max Protein  11 oz Oral BID BM   Continuous Infusions: . sodium chloride Stopped (03/16/20 0800)  . nafcillin (NAFCIL) continuous infusion 12 g (03/21/20 0935)   PRN Meds:.sodium chloride, acetaminophen, bisacodyl, bisacodyl, dextrose, haloperidol  Antibiotics  :   Anti-infectives (From admission, onward)   Start     Dose/Rate Route Frequency Ordered Stop   03/19/20 0730  nafcillin 12 g in sodium chloride 0.9 % 500 mL continuous infusion      12 g 20.8 mL/hr over 24 Hours Intravenous Every 24 hours 03/18/20 1205     03/17/20 1200  nafcillin 12 g in sodium chloride 0.9 % 500 mL continuous infusion  Status:  Discontinued     12 g 20.8 mL/hr over 24 Hours Intravenous Every 24 hours 03/17/20 1003 03/18/20 1205   03/14/20 0830  ceFAZolin (ANCEF) IVPB 1 g/50 mL premix  Status:  Discontinued     1 g 100 mL/hr over 30 Minutes Intravenous Every 8 hours 03/14/20 0823 03/14/20 0823   03/14/20 0830  ceFAZolin (ANCEF) IVPB 2g/100 mL premix  Status:  Discontinued     2 g 200 mL/hr over 30 Minutes Intravenous Every 8 hours 03/14/20 0823 03/17/20 1003   03/13/20 2115  ceFEPIme (MAXIPIME) 2 g in sodium chloride 0.9 % 100 mL IVPB     2 g 200 mL/hr over 30 Minutes Intravenous  Once 03/13/20 2114 03/14/20 0218       Objective   Vitals:   03/20/20 0811 03/21/20 0741 03/21/20 1139 03/21/20 1555  BP: 129/70 137/81 129/80 126/74  Pulse: 96 89 90 94  Resp:  Temp: 98.3 F (36.8 C) 98.4 F (36.9 C)  98 F (36.7 C) 99.4 F (37.4 C)  TempSrc: Oral Oral Oral   SpO2: 96% 94% 96% 98%  Weight:      Height:        SpO2: 98 %  Wt Readings from Last 3 Encounters:  03/13/20 60.3 kg  03/03/20 61.8 kg  02/16/20 61.8 kg     Intake/Output Summary (Last 24 hours) at 03/21/2020 1702 Last data filed at 03/21/2020 1500 Gross per 24 hour  Intake 727.82 ml  Output 1775 ml  Net -1047.18 ml    Physical Exam:     Awake Alert, Oriented X 3, disheveled, easily distracted No new F.N deficits,  [place].AT, Normal respiratory effort on room air, CTAB RRR,No Gallops,Rubs or new Murmurs,  +ve B.Sounds, Abd Soft, No tenderness, No rebound, guarding or rigidity. No Cyanosis, No new Rash or bruise     I have personally reviewed the following:   Data Reviewed:  CBC Recent Labs  Lab 03/18/20 0439 03/19/20 0518 03/20/20 0432 03/21/20 0656  WBC 16.4* 16.4* 11.9* 12.7*  HGB 9.7* 9.4* 8.7* 8.9*  HCT 28.6* 27.1* 25.5* 27.6*  PLT 369 464*  477* 581*  MCV 88.8 88.9 88.9 92.0  MCH 30.1 30.8 30.3 29.7  MCHC 33.9 34.7 34.1 32.2  RDW 13.8 13.9 13.9 14.0    Chemistries  Recent Labs  Lab 03/17/20 0524 03/17/20 0624 03/18/20 0439 03/19/20 0518 03/20/20 0432 03/21/20 0656  NA 133*  --  135 135 134* 133*  K 3.6  --  3.2* 4.1 3.8 4.1  CL 98  --  100 102 103 100  CO2 24  --  28 26 26 25   GLUCOSE 364*  --  281* 274* 229* 263*  BUN 25*  --  25* 24* 25* 17  CREATININE 0.77  --  0.58* 0.60* 0.62 0.54*  CALCIUM 8.3*  --  8.1* 8.1* 7.8* 7.7*  MG  --  1.8 2.1 2.1 2.2  --    ------------------------------------------------------------------------------------------------------------------ No results for input(s): CHOL, HDL, LDLCALC, TRIG, CHOLHDL, LDLDIRECT in the last 72 hours.  Lab Results  Component Value Date   HGBA1C 12.6 (H) 02/15/2020   ------------------------------------------------------------------------------------------------------------------ No results for input(s): TSH, T4TOTAL, T3FREE, THYROIDAB in the last 72 hours.  Invalid input(s): FREET3 ------------------------------------------------------------------------------------------------------------------ No results for input(s): VITAMINB12, FOLATE, FERRITIN, TIBC, IRON, RETICCTPCT in the last 72 hours.  Coagulation profile No results for input(s): INR, PROTIME in the last 168 hours.  No results for input(s): DDIMER in the last 72 hours.  Cardiac Enzymes No results for input(s): CKMB, TROPONINI, MYOGLOBIN in the last 168 hours.  Invalid input(s): CK ------------------------------------------------------------------------------------------------------------------    Component Value Date/Time   BNP 120.0 (H) 03/04/2020 1938    Micro Results Recent Results (from the past 240 hour(s))  Respiratory Panel by RT PCR (Flu A&B, Covid) - Nasopharyngeal Swab     Status: None   Collection Time: 03/13/20  7:33 PM   Specimen: Nasopharyngeal Swab  Result Value  Ref Range Status   SARS Coronavirus 2 by RT PCR NEGATIVE NEGATIVE Final    Comment: (NOTE) SARS-CoV-2 target nucleic acids are NOT DETECTED. The SARS-CoV-2 RNA is generally detectable in upper respiratoy specimens during the acute phase of infection. The lowest concentration of SARS-CoV-2 viral copies this assay can detect is 131 copies/mL. A negative result does not preclude SARS-Cov-2 infection and should not be used as the sole basis for treatment or other patient management decisions. A negative result may occur with  improper specimen collection/handling, submission of specimen  other than nasopharyngeal swab, presence of viral mutation(s) within the areas targeted by this assay, and inadequate number of viral copies (<131 copies/mL). A negative result must be combined with clinical observations, patient history, and epidemiological information. The expected result is Negative. Fact Sheet for Patients:  https://www.moore.com/ Fact Sheet for Healthcare Providers:  https://www.young.biz/ This test is not yet ap proved or cleared by the [place] FDA and  has been authorized for detection and/or diagnosis of SARS-CoV-2 by FDA under an Emergency Use Authorization (EUA). This EUA will remain  in effect (meaning this test can be used) for the duration of the COVID-19 declaration under Section 564(b)(1) of the Act, 21 U.S.C. section 360bbb-3(b)(1), unless the authorization is terminated or revoked sooner.    Influenza A by PCR NEGATIVE NEGATIVE Final   Influenza B by PCR NEGATIVE NEGATIVE Final    Comment: (NOTE) The Xpert Xpress SARS-CoV-2/FLU/RSV assay is intended as an aid in  the diagnosis of influenza from Nasopharyngeal swab specimens and  should not be used as a sole basis for treatment. Nasal washings and  aspirates are unacceptable for Xpert Xpress SARS-CoV-2/FLU/RSV  testing. Fact Sheet for  Patients: https://www.moore.com/ Fact Sheet for Healthcare Providers: https://www.young.biz/ This test is not yet approved or cleared by the [place] FDA and  has been authorized for detection and/or diagnosis of SARS-CoV-2 by  FDA under an Emergency Use Authorization (EUA). This EUA will remain  in effect (meaning this test can be used) for the duration of the  Covid-19 declaration under Section 564(b)(1) of the Act, 21  U.S.C. section 360bbb-3(b)(1), unless the authorization is  terminated or revoked. Performed at [facility], [address] [place], [place] [address]   Blood culture (routine x 2)     Status: Abnormal   Collection Time: 03/13/20  7:33 PM   Specimen: BLOOD  Result Value Ref Range Status   Specimen Description   Final    BLOOD LEFT HAND Performed at [facility], [address] [place], [place] [address]    Special Requests   Final    BOTTLES DRAWN AEROBIC AND ANAEROBIC Blood Culture results may not be optimal due to an inadequate volume of blood received in culture bottles Performed at [facility], [address], [place], [place] [address]    Culture  Setup Time   Final    GRAM POSITIVE COCCI IN BOTH AEROBIC AND ANAEROBIC BOTTLES CRITICAL RESULT CALLED TO, READ BACK BY AND VERIFIED WITH: [facility] HALLAJI AT 9147 03/14/20.PMF Performed at [facility], [address], [place], [place] [address]    Culture STAPHYLOCOCCUS AUREUS (A)  Final   Report Status 03/16/2020 FINAL  Final   Organism ID, Bacteria STAPHYLOCOCCUS AUREUS  Final      Susceptibility   Staphylococcus aureus - MIC*    CIPROFLOXACIN <=0.5 SENSITIVE Sensitive     ERYTHROMYCIN >=8 RESISTANT Resistant     GENTAMICIN <=0.5 SENSITIVE Sensitive     OXACILLIN 0.5 SENSITIVE Sensitive     TETRACYCLINE <=1 SENSITIVE Sensitive     VANCOMYCIN 1 SENSITIVE Sensitive     TRIMETH/SULFA <=10 SENSITIVE Sensitive     CLINDAMYCIN  RESISTANT Resistant     RIFAMPIN <=0.5 SENSITIVE Sensitive     Inducible Clindamycin POSITIVE Resistant     * STAPHYLOCOCCUS AUREUS  Blood culture (routine x 2)     Status: Abnormal   Collection Time: 03/13/20  7:33 PM   Specimen: BLOOD  Result Value Ref Range Status  Specimen Description   Final    BLOOD LEFT AC Performed at [facility], [address] [place], [place] [address]    Special Requests   Final    BOTTLES DRAWN AEROBIC AND ANAEROBIC Blood Culture results may not be optimal due to an inadequate volume of blood received in culture bottles Performed at [facility], [address] [place], [place] [address]    Culture  Setup Time   Final    GRAM POSITIVE COCCI IN BOTH AEROBIC AND ANAEROBIC BOTTLES CRITICAL RESULT CALLED TO, READ BACK BY AND VERIFIED WITH: [facility] HALLAJI AT 6269 03/14/20.PMF Performed at [facility], [address] [place], [place] [address]    Culture (A)  Final    STAPHYLOCOCCUS AUREUS SUSCEPTIBILITIES PERFORMED ON PREVIOUS CULTURE WITHIN THE LAST 5 DAYS. Performed at [facility], [address], [place], [place] [address]    Report Status 03/16/2020 FINAL  Final  Blood Culture ID Panel (Reflexed)     Status: Abnormal   Collection Time: 03/13/20  7:33 PM  Result Value Ref Range Status   Enterococcus species NOT DETECTED NOT DETECTED Final   Listeria monocytogenes NOT DETECTED NOT DETECTED Final   Staphylococcus species DETECTED (A) NOT DETECTED Final    Comment: CRITICAL RESULT CALLED TO, READ BACK BY AND VERIFIED WITH: [facility] HALLAJI AT 0093 03/14/20.PMF    Staphylococcus aureus (BCID) DETECTED (A) NOT DETECTED Final    Comment: Methicillin (oxacillin) susceptible Staphylococcus aureus (MSSA). Preferred therapy is anti staphylococcal beta lactam antibiotic (Cefazolin or Nafcillin), unless clinically contraindicated. CRITICAL RESULT CALLED TO, READ BACK BY AND VERIFIED WITH: [facility] HALLAJI AT 8182 03/14/20.PMF     Methicillin resistance NOT DETECTED NOT DETECTED Final   Streptococcus species NOT DETECTED NOT DETECTED Final   Streptococcus agalactiae NOT DETECTED NOT DETECTED Final   Streptococcus pneumoniae NOT DETECTED NOT DETECTED Final   Streptococcus pyogenes NOT DETECTED NOT DETECTED Final   Acinetobacter baumannii NOT DETECTED NOT DETECTED Final   Enterobacteriaceae species NOT DETECTED NOT DETECTED Final   Enterobacter cloacae complex NOT DETECTED NOT DETECTED Final   Escherichia coli NOT DETECTED NOT DETECTED Final   Klebsiella oxytoca NOT DETECTED NOT DETECTED Final   Klebsiella pneumoniae NOT DETECTED NOT DETECTED Final   Proteus species NOT DETECTED NOT DETECTED Final   Serratia marcescens NOT DETECTED NOT DETECTED Final   Haemophilus influenzae NOT DETECTED NOT DETECTED Final   Neisseria meningitidis NOT DETECTED NOT DETECTED Final   Pseudomonas aeruginosa NOT DETECTED NOT DETECTED Final   Candida albicans NOT DETECTED NOT DETECTED Final   Candida glabrata NOT DETECTED NOT DETECTED Final   Candida krusei NOT DETECTED NOT DETECTED Final   Candida parapsilosis NOT DETECTED NOT DETECTED Final   Candida tropicalis NOT DETECTED NOT DETECTED Final    Comment: Performed at [facility], [address] [place], [place] [address]  Culture, blood (routine x 2)     Status: None   Collection Time: 03/15/20  6:55 AM   Specimen: BLOOD LEFT HAND  Result Value Ref Range Status   Specimen Description BLOOD LEFT HAND  Final   Special Requests   Final    BOTTLES DRAWN AEROBIC AND ANAEROBIC Blood Culture adequate volume   Culture   Final    NO GROWTH 5 DAYS Performed at [facility], [address], [place], [place] [address]    Report Status 03/20/2020 FINAL  Final  Culture, blood (routine x 2)     Status: None  Collection Time: 03/15/20  6:55 AM   Specimen: BLOOD LEFT FOREARM  Result Value Ref Range Status   Specimen Description BLOOD LEFT FOREARM  Final   Special  Requests   Final    BOTTLES DRAWN AEROBIC AND ANAEROBIC Blood Culture adequate volume   Culture   Final    NO GROWTH 5 DAYS Performed at [facility], [address] [place], [place] [address]    Report Status 03/20/2020 FINAL  Final    Radiology Reports DG Chest 2 View  Result Date: 03/03/2020 CLINICAL DATA:  Pain following fall EXAM: CHEST - 2 VIEW COMPARISON:  Chest radiograph February 14, 2020 FINDINGS: Lungs are clear. Heart size and pulmonary vascularity are normal. No adenopathy. No pneumothorax. No appreciable bone lesions. Postoperative change noted in the lower cervical region. IMPRESSION: Lungs clear. Cardiac silhouette within normal limits. No pneumothorax. Electronically Signed   By: Bretta Bang III M.D.   On: 03/03/2020 13:34   CT Head Wo Contrast  Result Date: 03/03/2020 CLINICAL DATA:  Pain following fall EXAM: CT HEAD WITHOUT CONTRAST TECHNIQUE: Contiguous axial images were obtained from the base of the skull through the vertex without intravenous contrast. COMPARISON:  February 13, 2020 FINDINGS: Brain: Ventricles and sulci are normal in size and configuration. There is no intracranial mass, hemorrhage, extra-axial fluid collection, or midline shift. There is slight small vessel disease in the centra semiovale bilaterally. A small focus of decreased attenuation in the left lentiform nucleus likely represents an asymmetric sulcus. No acute infarct is demonstrable on this study. Vascular: No hyperdense vessel. There is calcification in each carotid siphon region. Skull: Burr hole defects in the left frontal and left parietal regions are stable. Bony calvarium otherwise appears intact and stable. Sinuses/Orbits: Visualized paranasal sinuses are clear. Visualized orbits appear symmetric bilaterally. Other: Visualized mastoid air cells are clear. There is debris in the right external auditory canal. IMPRESSION: Slight periventricular small vessel disease, stable. No mass or  hemorrhage. No acute infarct evident. Foci of arterial vascular calcification noted. Postoperative bony defects in the left frontal and parietal bones. No new bony defect. Probable cerumen in the right external auditory canal. Electronically Signed   By: Bretta Bang III M.D.   On: 03/03/2020 13:38   CT CERVICAL SPINE WO CONTRAST  Result Date: 03/05/2020 CLINICAL DATA:  Acute neck pain. Recent fall. EXAM: CT CERVICAL SPINE WITHOUT CONTRAST TECHNIQUE: Multidetector CT imaging of the cervical spine was performed without intravenous contrast. Multiplanar CT image reconstructions were also generated. COMPARISON:  Cervical spine CT dated 08/19/2019. FINDINGS: Alignment: Alignment is stable. No evidence of acute vertebral body subluxation. Skull base and vertebrae: New minimally displaced fracture at the anterior margin of the C4 vertebral body, LEFT of midline (coronal series 7, image 30; axial series 2, images 46 and 47). No other fracture line or displaced fracture fragment identified. Fixation hardware at the C5 through C7 levels appears intact and appropriately positioned. Facet joints are normally aligned. Soft tissues and spinal canal: There is prevertebral fluid which is new since previous CT of 08/19/2019. No canal hematoma. Disc levels: Expected osseous fusion at the C5 through C7 levels. Remaining disc spaces of the cervical spine appear well maintained. Moderate central canal stenoses at the surgical levels due to posterior disc-osteophytic ossification. Additional degenerative hypertrophy of the uncovertebral and facet joints causing moderate to severe LEFT neural foramen stenosis at C3-4, moderate bilateral neural foramen stenosis at C4-5, moderate to severe bilateral neural foramen stenoses at C5-6 and moderate bilateral  neural foramen stenoses at C6-7. There may be associated nerve root impingement at multiple levels. Upper chest: No acute findings. Other: Bilateral carotid atherosclerosis.  IMPRESSION: 1. New minimally displaced fracture at the anterior margin of the C4 vertebral body, LEFT of midline. There is no fracture extension into the posterior vertebral body suggesting a stable fracture. 2. Prevertebral fluid which is new since previous CT of 08/19/2019, almost certainly related to the new fracture at the anterior margin of the C4 vertebral body, and suspicious for associated injury/tear of the anterior longitudinal ligament. 3. Expected osseous fusion at the C5 through C7 levels. 4. Degenerative changes as detailed above. There may be associated nerve root impingement at multiple levels. 5. Bilateral carotid atherosclerosis. These results will be called to the ordering clinician or representative by the Radiologist Assistant, and communication documented in the PACS or [employer]. Electronically Signed   By: Bary Richard M.D.   On: 03/05/2020 14:35   MR CERVICAL SPINE W WO CONTRAST  Result Date: 03/17/2020 CLINICAL DATA:  Cervical spine fracture. MSSA bacteremia EXAM: MRI CERVICAL SPINE WITHOUT AND WITH CONTRAST TECHNIQUE: Multiplanar and multiecho pulse sequences of the cervical spine, to include the craniocervical junction and cervicothoracic junction, were obtained without and with intravenous contrast. CONTRAST:  6mL GADAVIST GADOBUTROL 1 MMOL/ML IV SOLN COMPARISON:  CT cervical spine 03/05/2020 FINDINGS: The examination is severely degraded by motion. There is an intermediate sized prevertebral effusion that extends the length of the cervical spine and measures 10 mm in thickness. I do not see evidence of discitis-osteomyelitis on the single sagittal series that is not terribly motion degraded (series 12). IMPRESSION: 1. Severely motion degraded examination. 2. No evidence of discitis-osteomyelitis, though assessment is limited to a single sagittal STIR sequence. 3. Prevertebral effusion extending the length of the cervical spine, 10 mm in thickness. Electronically Signed   By:  Deatra Robinson M.D.   On: 03/17/2020 23:37   CT HIP RIGHT WO CONTRAST  Result Date: 03/04/2020 CLINICAL DATA:  Right hip pain after fall EXAM: CT OF THE RIGHT HIP WITHOUT CONTRAST TECHNIQUE: Multidetector CT imaging of the right hip was performed according to the standard protocol. Multiplanar CT image reconstructions were also generated. COMPARISON:  X-ray 03/03/2020, 11/05/2016 FINDINGS: Bones/Joint/Cartilage No acute fracture. No dislocation. Stable area of sclerosis within the lateral aspect of the right femoral neck, unchanged from 11/05/2016, benign. No suspicious osseous lesion. No evidence of femoral head AVN by CT. Mild right hip joint space narrowing. Trace amount of joint fluid is evident. No joint effusion. Ligaments Suboptimally assessed by CT. Muscles and Tendons Tendinous structures are grossly intact within the limitations of CT. No muscle atrophy or fatty infiltration. Soft tissues There is layering fluid overlying the lateral aspect of the right hip and lower extremity without a well-defined collection or hematoma. There is also fluid underlying the iliotibial band, some of which may be within the peritrochanteric bursa. IMPRESSION: 1. No acute fracture or dislocation of the right hip. 2. Layering fluid overlying the lateral aspect of the right hip and lower extremity without a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

## 2020-03-22 LAB — BASIC METABOLIC PANEL
Anion gap: 8 (ref 5–15)
BUN: 23 mg/dL (ref 8–23)
CO2: 24 mmol/L (ref 22–32)
Calcium: 7.9 mg/dL — ABNORMAL LOW (ref 8.9–10.3)
Chloride: 102 mmol/L (ref 98–111)
Creatinine, Ser: 0.61 mg/dL (ref 0.61–1.24)
GFR calc Af Amer: >60 mL/min (ref >60)
GFR calc non Af Amer: >60 mL/min (ref >60)
Glucose, Bld: 331 mg/dL — ABNORMAL HIGH (ref 70–99)
Potassium: 4.3 mmol/L (ref 3.5–5.1)
Sodium: 134 mmol/L — ABNORMAL LOW (ref 135–145)

## 2020-03-22 LAB — GLUCOSE, CAPILLARY
Glucose-Capillary: >600 mg/dL (ref 70–99)
Glucose-Capillary: 298 mg/dL — ABNORMAL HIGH (ref 70–99)
Glucose-Capillary: 433 mg/dL — ABNORMAL HIGH (ref 70–99)
Glucose-Capillary: 449 mg/dL — ABNORMAL HIGH (ref 70–99)
Glucose-Capillary: 218 mg/dL — ABNORMAL HIGH (ref 70–99)
Glucose-Capillary: 191 mg/dL — ABNORMAL HIGH (ref 70–99)

## 2020-03-22 LAB — CBC
HCT: 27.2 % — ABNORMAL LOW (ref 39.0–52.0)
Hemoglobin: 9.2 g/dL — ABNORMAL LOW (ref 13.0–17.0)
MCH: 30.1 pg (ref 26.0–34.0)
MCHC: 33.8 g/dL (ref 30.0–36.0)
MCV: 88.9 fL (ref 80.0–100.0)
Platelets: 648 10*3/uL — ABNORMAL HIGH (ref 150–400)
RBC: 3.06 MIL/uL — ABNORMAL LOW (ref 4.22–5.81)
RDW: 13.9 % (ref 11.5–15.5)
WBC: 12.6 10*3/uL — ABNORMAL HIGH (ref 4.0–10.5)
nRBC: 0 % (ref 0.0–0.2)

## 2020-03-22 MED ORDER — INSULIN ASPART 100 UNIT/ML ~~LOC~~ SOLN
12.0000 [IU] | Freq: Three times a day (TID) | SUBCUTANEOUS | Status: DC
  Administered 2020-03-22 – 2020-03-31 (×26): 12 [IU] via SUBCUTANEOUS
  Filled 2020-03-22 (×27): qty 1

## 2020-03-22 MED ORDER — INSULIN GLARGINE 100 UNIT/ML ~~LOC~~ SOLN
30.0000 [IU] | Freq: Every day | SUBCUTANEOUS | Status: DC
  Administered 2020-03-22 – 2020-03-29 (×8): 30 [IU] via SUBCUTANEOUS
  Filled 2020-03-22 (×8): qty 0.3

## 2020-03-22 NOTE — Progress Notes (Signed)
Inpatient Diabetes Program Recommendations  AACE/ADA: New Consensus Statement on Inpatient Glycemic Control   Target Ranges:  Prepandial:   less than 140 mg/dL      Peak postprandial:   less than 180 mg/dL (1-2 hours)      Critically ill patients:  140 - 180 mg/dL   Results for Spencer [name], Spencer [name] (MRN [id number]) as of 03/22/2020 07:40  Ref. Range 03/21/2020 07:41 03/21/2020 08:04 03/21/2020 11:59 03/21/2020 16:42 03/21/2020 20:31 03/21/2020 22:10 03/22/2020 07:25  Glucose-Capillary Latest Ref Range: 70 - 99 mg/dL 909 (H) 030 (H) 149 (H) 119 (H) 45 (L) 114 (H) 433 (H)   Review of Glycemic Control  Outpatient Diabetes medications: Lantus 18 units daily, Novolog 12 units TID with meals Current orders for Inpatient glycemic control: Levemir 35 units daily, Novolog 10 units TID with meals, Novolog 0-15 units TID with meals, Novolog 0-5 units QHS  Inpatient Diabetes Program Recommendations:   Insulin - Basal: Question if Levemir is working for 24 hours.  Please consider changing Levemir to Lantus and decrease to 30 units daily to start this morning.  Insulin - Meal Coverage: Please consider increasing meal coverage to Novolog 12 units TID with meals if patient eats at least 50% of meals.  Thanks, Orlando Penner, RN, MSN, CDE Diabetes Coordinator Inpatient Diabetes Program [phone] (Team Pager from 8am to 5pm)

## 2020-03-22 NOTE — Progress Notes (Signed)
[facility]  PROGRESS NOTE  Spencer [name]:[id number] DOB: [birth year] DOA: 03/13/2020 PCP: Patient, No Pcp Per Admit date - 03/13/2020   Admitting Physician Arelia Sneddon, MD  Outpatient Primary MD for the patient is Patient, No Pcp Per  LOS - 9 Brief Narrative   Spencer [name] is a 59 y.o. year old male with medical history significant for type 2 diabetes mellitus, cocaine abuse,recent fall with C4 anterior cervical fracture with neck collar in place, brain aneurysm,hypertension, GERD.Patient was recently admitted on 4/28/21for DKA, acute renal failure and patient signed out AGAINST MEDICAL ADVICE on 03/10/2020 who presented on 03/13/2020 with hyperglycemia in setting of poor adherence to insulin therapy and was found to have DKA with anion gap of 30, serum bicarb of eight on admission requiring initiation of insulin drip as well as fluids per DKA protocol.  Hospital course complicated by previous diagnosis of MSSA bacteremia for which he left AGAINST MEDICAL ADVICE before recommended TEE for evaluation of endocarditis on last hospital stay.  Was found to have persistent bacteremia on admitting blood culture in setting of not being on appropriate antibiotics due to leaving AGAINST MEDICAL ADVICE during previous hospital stay.  Infectious disease recommended continuing on nafcillin with high concern for potential endocarditis though TTE is unremarkable, and possible osteomyelitis given limited assessment on MRI C-spine. ID recommending 6 weeks of total IV antibiotics.  Patient initially declined TEE as recommended by cardiology, was evaluated by psychiatry who deemed patient does not have medical decision-making capacity Subjective  Today he he states he is in pain all over which is typical.  Denies any shortness of breath, or chest pain  A & P   Lack of medical decision-making capacity. Evaluated by psychiatry on 5/15 patient has impaired medical decision-making capacity, currently  agreeable to continuing inpatient care none -patient does not meet decision-making capacity, will consult next of kin or ethics committee for continued treatment planning  Type 2 diabetes, poorly controlled, A1c 12.6. Presented in DKA in setting of poor insulin adherence. FBG still quite elevated this a.m. -Change from Levemir to Lantus 30 units -Continue mealtime NovoLog, increase to 12 units 3 times daily with meals (as well as eating greater than 50% of meals) -Sliding scale as needed with meals and nightly -Continue to monitor CBGs  MSSA bacteremia, recurrent in the setting of leaving AMA on previous hospital stay, admitting blood cultures on 5/8+ for MSSA.  TTE unremarkable, patient initially declined TEE on 5/13 discussed with cardiology, also some concern of moderate sedation, also high risk given cervical fracture. Patient does not have decision-making capacity. Remains afebrile and glucose is slightly elevated at 12.7 ( down from peak of 32.4). Repeat blood culture on 5/10 no growth x5 days -Discussed with family, and reassess with cardiology -Continue nafcillin for six total weeks of therapy per ID recommendations  Intermittent episodes of agitation, currently stable -Psych recommends low-dose Haldol 2 mg in the future  Minimally displaced C4 fracture. Has been instructed to wear Aspen collar at all times however has been declining it. Has no symptoms of cervical myelopathy -Neurosurgery previously evaluated 5/3, patient okay to undergo TEE as long as the Aspen collar remains in place during the entirety of the procedure -Discussed with nursing as collar does not fit well and is uncomfortable -Follow-up with orthotic supplier to ensure he has well fitting Aspen collar -Outpatient follow-up with neurosurgery clinic (christine Zdeb, NP)  AKI, prerenal etiology related to dehydration related to DKA on admission.  Peak creatinine  of 1.89 on admission, improved with IV fluids.  Back at  baseline currently  Cocaine abuse.  UDS positive for cocaine on admission.  --Cessation encouraged  Hypertension, stable -Continue home amlodipine -Held losartan on admission due to AKI   Family Communication  : Updated sister on the phone  Code Status : Full  Disposition Plan  :  Patient is from home. Anticipated d/c date: 2 to 3 days. Barriers to d/c or necessity for inpatient status:  Needs safe disposition as patient will need continued IV antibiotics and does not have the capacity to make medical decisions, will need to determine the patient able to go home with close family support versus facility Consults  : ID, cardiology, psychiatry  Procedures  : None  DVT Prophylaxis  :  Lovenox   Lab Results  Component Value Date   PLT 648 (H) 03/22/2020    Diet :  Diet Order            Diet Carb Modified Fluid consistency: Thin; Room service appropriate? Yes with Assist  Diet effective now               Inpatient Medications Scheduled Meds: . amLODipine  10 mg Oral Daily  . aspirin EC  81 mg Oral Daily  . enoxaparin (LOVENOX) injection  40 mg Subcutaneous Q24H  . famotidine  20 mg Oral Daily  . insulin aspart  0-15 Units Subcutaneous TID WC  . insulin aspart  0-5 Units Subcutaneous QHS  . insulin aspart  12 Units Subcutaneous TID WC  . insulin glargine  30 Units Subcutaneous Daily  . losartan  50 mg Oral Daily  . methocarbamol  500 mg Oral TID  . multivitamin with minerals  1 tablet Oral Daily  . polyethylene glycol  17 g Oral Daily  . Ensure Max Protein  11 oz Oral BID BM   Continuous Infusions: . sodium chloride Stopped (03/16/20 0800)  . nafcillin (NAFCIL) continuous infusion 12 g (03/22/20 0813)   PRN Meds:.sodium chloride, acetaminophen, bisacodyl, bisacodyl, dextrose, haloperidol  Antibiotics  :   Anti-infectives (From admission, onward)   Start     Dose/Rate Route Frequency Ordered Stop   03/19/20 0730  nafcillin 12 g in sodium chloride 0.9 % 500 mL  continuous infusion     12 g 20.8 mL/hr over 24 Hours Intravenous Every 24 hours 03/18/20 1205     03/17/20 1200  nafcillin 12 g in sodium chloride 0.9 % 500 mL continuous infusion  Status:  Discontinued     12 g 20.8 mL/hr over 24 Hours Intravenous Every 24 hours 03/17/20 1003 03/18/20 1205   03/14/20 0830  ceFAZolin (ANCEF) IVPB 1 g/50 mL premix  Status:  Discontinued     1 g 100 mL/hr over 30 Minutes Intravenous Every 8 hours 03/14/20 0823 03/14/20 0823   03/14/20 0830  ceFAZolin (ANCEF) IVPB 2g/100 mL premix  Status:  Discontinued     2 g 200 mL/hr over 30 Minutes Intravenous Every 8 hours 03/14/20 0823 03/17/20 1003   03/13/20 2115  ceFEPIme (MAXIPIME) 2 g in sodium chloride 0.9 % 100 mL IVPB     2 g 200 mL/hr over 30 Minutes Intravenous  Once 03/13/20 2114 03/14/20 0218       Objective   Vitals:   03/21/20 1555 03/21/20 2355 03/22/20 0723 03/22/20 1610  BP: 126/74 (!) 152/89 (!) 151/94 121/76  Pulse: 94 88 87 91  Resp: 18 16  16   Temp: 99.4 F (37.4 C)  99.3 F (37.4 C) 98.4 F (36.9 C) 98.4 F (36.9 C)  TempSrc:  Oral Oral Oral  SpO2: 98% 100% 95% 97%  Weight:      Height:        SpO2: 97 %  Wt Readings from Last 3 Encounters:  03/13/20 60.3 kg  03/03/20 61.8 kg  02/16/20 61.8 kg     Intake/Output Summary (Last 24 hours) at 03/22/2020 1834 Last data filed at 03/22/2020 1400 Gross per 24 hour  Intake 1200 ml  Output 1650 ml  Net -450 ml    Physical Exam:     Awake Alert, Oriented X 3, disheveled, easily distracted No new F.N deficits,  [place].AT, Normal respiratory effort on room air, CTAB RRR,No Gallops,Rubs or new Murmurs,  +ve B.Sounds, Abd Soft, No tenderness, No rebound, guarding or rigidity. No Cyanosis, No new Rash or bruise     I have personally reviewed the following:   Data Reviewed:  CBC Recent Labs  Lab 03/18/20 0439 03/19/20 0518 03/20/20 0432 03/21/20 0656 03/22/20 0439  WBC 16.4* 16.4* 11.9* 12.7* 12.6*  HGB 9.7* 9.4* 8.7*  8.9* 9.2*  HCT 28.6* 27.1* 25.5* 27.6* 27.2*  PLT 369 464* 477* 581* 648*  MCV 88.8 88.9 88.9 92.0 88.9  MCH 30.1 30.8 30.3 29.7 30.1  MCHC 33.9 34.7 34.1 32.2 33.8  RDW 13.8 13.9 13.9 14.0 13.9    Chemistries  Recent Labs  Lab 03/17/20 0524 03/17/20 0624 03/18/20 0439 03/19/20 0518 03/20/20 0432 03/21/20 0656 03/22/20 0439  NA   < >  --  135 135 134* 133* 134*  K   < >  --  3.2* 4.1 3.8 4.1 4.3  CL   < >  --  100 102 103 100 102  CO2   < >  --  28 26 26 25 24   GLUCOSE   < >  --  281* 274* 229* 263* 331*  BUN   < >  --  25* 24* 25* 17 23  CREATININE   < >  --  0.58* 0.60* 0.62 0.54* 0.61  CALCIUM   < >  --  8.1* 8.1* 7.8* 7.7* 7.9*  MG  --  1.8 2.1 2.1 2.2  --   --    < > = values in this interval not displayed.   ------------------------------------------------------------------------------------------------------------------ No results for input(s): CHOL, HDL, LDLCALC, TRIG, CHOLHDL, LDLDIRECT in the last 72 hours.  Lab Results  Component Value Date   HGBA1C 12.6 (H) 02/15/2020   ------------------------------------------------------------------------------------------------------------------ No results for input(s): TSH, T4TOTAL, T3FREE, THYROIDAB in the last 72 hours.  Invalid input(s): FREET3 ------------------------------------------------------------------------------------------------------------------ No results for input(s): VITAMINB12, FOLATE, FERRITIN, TIBC, IRON, RETICCTPCT in the last 72 hours.  Coagulation profile No results for input(s): INR, PROTIME in the last 168 hours.  No results for input(s): DDIMER in the last 72 hours.  Cardiac Enzymes No results for input(s): CKMB, TROPONINI, MYOGLOBIN in the last 168 hours.  Invalid input(s): CK ------------------------------------------------------------------------------------------------------------------    Component Value Date/Time   BNP 120.0 (H) 03/04/2020 1938    Micro Results Recent  Results (from the past 240 hour(s))  Respiratory Panel by RT PCR (Flu A&B, Covid) - Nasopharyngeal Swab     Status: None   Collection Time: 03/13/20  7:33 PM   Specimen: Nasopharyngeal Swab  Result Value Ref Range Status   SARS Coronavirus 2 by RT PCR NEGATIVE NEGATIVE Final    Comment: (NOTE) SARS-CoV-2 target nucleic acids are NOT DETECTED. The SARS-CoV-2 RNA is generally  detectable in upper respiratoy specimens during the acute phase of infection. The lowest concentration of SARS-CoV-2 viral copies this assay can detect is 131 copies/mL. A negative result does not preclude SARS-Cov-2 infection and should not be used as the sole basis for treatment or other patient management decisions. A negative result may occur with  improper specimen collection/handling, submission of specimen other than nasopharyngeal swab, presence of viral mutation(s) within the areas targeted by this assay, and inadequate number of viral copies (<131 copies/mL). A negative result must be combined with clinical observations, patient history, and epidemiological information. The expected result is Negative. Fact Sheet for Patients:  https://www.moore.com/ Fact Sheet for Healthcare Providers:  https://www.young.biz/ This test is not yet ap proved or cleared by the [place] FDA and  has been authorized for detection and/or diagnosis of SARS-CoV-2 by FDA under an Emergency Use Authorization (EUA). This EUA will remain  in effect (meaning this test can be used) for the duration of the COVID-19 declaration under Section 564(b)(1) of the Act, 21 U.S.C. section 360bbb-3(b)(1), unless the authorization is terminated or revoked sooner.    Influenza A by PCR NEGATIVE NEGATIVE Final   Influenza B by PCR NEGATIVE NEGATIVE Final    Comment: (NOTE) The Xpert Xpress SARS-CoV-2/FLU/RSV assay is intended as an aid in  the diagnosis of influenza from Nasopharyngeal swab specimens  and  should not be used as a sole basis for treatment. Nasal washings and  aspirates are unacceptable for Xpert Xpress SARS-CoV-2/FLU/RSV  testing. Fact Sheet for Patients: https://www.moore.com/ Fact Sheet for Healthcare Providers: https://www.young.biz/ This test is not yet approved or cleared by the [place] FDA and  has been authorized for detection and/or diagnosis of SARS-CoV-2 by  FDA under an Emergency Use Authorization (EUA). This EUA will remain  in effect (meaning this test can be used) for the duration of the  Covid-19 declaration under Section 564(b)(1) of the Act, 21  U.S.C. section 360bbb-3(b)(1), unless the authorization is  terminated or revoked. Performed at [facility], [address] [place], [place] [address]   Blood culture (routine x 2)     Status: Abnormal   Collection Time: 03/13/20  7:33 PM   Specimen: BLOOD  Result Value Ref Range Status   Specimen Description   Final    BLOOD LEFT HAND Performed at [facility], [address] [place], [place] [address]    Special Requests   Final    BOTTLES DRAWN AEROBIC AND ANAEROBIC Blood Culture results may not be optimal due to an inadequate volume of blood received in culture bottles Performed at [facility], [address], [place], [place] [address]    Culture  Setup Time   Final    GRAM POSITIVE COCCI IN BOTH AEROBIC AND ANAEROBIC BOTTLES CRITICAL RESULT CALLED TO, READ BACK BY AND VERIFIED WITH: [facility] HALLAJI AT 9147 03/14/20.PMF Performed at [facility], [address], [place], [place] [address]    Culture STAPHYLOCOCCUS AUREUS (A)  Final   Report Status 03/16/2020 FINAL  Final   Organism ID, Bacteria STAPHYLOCOCCUS AUREUS  Final      Susceptibility   Staphylococcus aureus - MIC*    CIPROFLOXACIN <=0.5 SENSITIVE Sensitive     ERYTHROMYCIN >=8 RESISTANT Resistant     GENTAMICIN <=0.5 SENSITIVE Sensitive     OXACILLIN  0.5 SENSITIVE Sensitive     TETRACYCLINE <=1 SENSITIVE Sensitive     VANCOMYCIN 1 SENSITIVE Sensitive     TRIMETH/SULFA <=10 SENSITIVE Sensitive  CLINDAMYCIN RESISTANT Resistant     RIFAMPIN <=0.5 SENSITIVE Sensitive     Inducible Clindamycin POSITIVE Resistant     * STAPHYLOCOCCUS AUREUS  Blood culture (routine x 2)     Status: Abnormal   Collection Time: 03/13/20  7:33 PM   Specimen: BLOOD  Result Value Ref Range Status   Specimen Description   Final    BLOOD LEFT AC Performed at [facility], [address] [place], [place] [address]    Special Requests   Final    BOTTLES DRAWN AEROBIC AND ANAEROBIC Blood Culture results may not be optimal due to an inadequate volume of blood received in culture bottles Performed at [facility], [address] [place], [place] [address]    Culture  Setup Time   Final    GRAM POSITIVE COCCI IN BOTH AEROBIC AND ANAEROBIC BOTTLES CRITICAL RESULT CALLED TO, READ BACK BY AND VERIFIED WITH: [facility] HALLAJI AT 1740 03/14/20.PMF Performed at [facility], [address] [place], [place] [address]    Culture (A)  Final    STAPHYLOCOCCUS AUREUS SUSCEPTIBILITIES PERFORMED ON PREVIOUS CULTURE WITHIN THE LAST 5 DAYS. Performed at [facility], [address] [place], [place] [address]    Report Status 03/16/2020 FINAL  Final  Blood Culture ID Panel (Reflexed)     Status: Abnormal   Collection Time: 03/13/20  7:33 PM  Result Value Ref Range Status   Enterococcus species NOT DETECTED NOT DETECTED Final   Listeria monocytogenes NOT DETECTED NOT DETECTED Final   Staphylococcus species DETECTED (A) NOT DETECTED Final    Comment: CRITICAL RESULT CALLED TO, READ BACK BY AND VERIFIED WITH: [facility] HALLAJI AT 1497 03/14/20.PMF    Staphylococcus aureus (BCID) DETECTED (A) NOT DETECTED Final    Comment: Methicillin (oxacillin) susceptible Staphylococcus aureus (MSSA). Preferred therapy is anti staphylococcal beta lactam  antibiotic (Cefazolin or Nafcillin), unless clinically contraindicated. CRITICAL RESULT CALLED TO, READ BACK BY AND VERIFIED WITH: [facility] HALLAJI AT 0263 03/14/20.PMF    Methicillin resistance NOT DETECTED NOT DETECTED Final   Streptococcus species NOT DETECTED NOT DETECTED Final   Streptococcus agalactiae NOT DETECTED NOT DETECTED Final   Streptococcus pneumoniae NOT DETECTED NOT DETECTED Final   Streptococcus pyogenes NOT DETECTED NOT DETECTED Final   Acinetobacter baumannii NOT DETECTED NOT DETECTED Final   Enterobacteriaceae species NOT DETECTED NOT DETECTED Final   Enterobacter cloacae complex NOT DETECTED NOT DETECTED Final   Escherichia coli NOT DETECTED NOT DETECTED Final   Klebsiella oxytoca NOT DETECTED NOT DETECTED Final   Klebsiella pneumoniae NOT DETECTED NOT DETECTED Final   Proteus species NOT DETECTED NOT DETECTED Final   Serratia marcescens NOT DETECTED NOT DETECTED Final   Haemophilus influenzae NOT DETECTED NOT DETECTED Final   Neisseria meningitidis NOT DETECTED NOT DETECTED Final   Pseudomonas aeruginosa NOT DETECTED NOT DETECTED Final   Candida albicans NOT DETECTED NOT DETECTED Final   Candida glabrata NOT DETECTED NOT DETECTED Final   Candida krusei NOT DETECTED NOT DETECTED Final   Candida parapsilosis NOT DETECTED NOT DETECTED Final   Candida tropicalis NOT DETECTED NOT DETECTED Final    Comment: Performed at [facility], [address] [place], [place] [address]  Culture, blood (routine x 2)     Status: None   Collection Time: 03/15/20  6:55 AM   Specimen: BLOOD LEFT HAND  Result Value Ref Range Status   Specimen Description BLOOD LEFT HAND  Final   Special Requests   Final  BOTTLES DRAWN AEROBIC AND ANAEROBIC Blood Culture adequate volume   Culture   Final    NO GROWTH 5 DAYS Performed at [facility], [address] [place], [place] [address]    Report Status 03/20/2020 FINAL  Final  Culture, blood (routine x 2)     Status:  None   Collection Time: 03/15/20  6:55 AM   Specimen: BLOOD LEFT FOREARM  Result Value Ref Range Status   Specimen Description BLOOD LEFT FOREARM  Final   Special Requests   Final    BOTTLES DRAWN AEROBIC AND ANAEROBIC Blood Culture adequate volume   Culture   Final    NO GROWTH 5 DAYS Performed at [facility], [address] [place], [place] [address]    Report Status 03/20/2020 FINAL  Final    Radiology Reports DG Chest 2 View  Result Date: 03/03/2020 CLINICAL DATA:  Pain following fall EXAM: CHEST - 2 VIEW COMPARISON:  Chest radiograph February 14, 2020 FINDINGS: Lungs are clear. Heart size and pulmonary vascularity are normal. No adenopathy. No pneumothorax. No appreciable bone lesions. Postoperative change noted in the lower cervical region. IMPRESSION: Lungs clear. Cardiac silhouette within normal limits. No pneumothorax. Electronically Signed   By: Bretta Bang III M.D.   On: 03/03/2020 13:34   CT Head Wo Contrast  Result Date: 03/03/2020 CLINICAL DATA:  Pain following fall EXAM: CT HEAD WITHOUT CONTRAST TECHNIQUE: Contiguous axial images were obtained from the base of the skull through the vertex without intravenous contrast. COMPARISON:  February 13, 2020 FINDINGS: Brain: Ventricles and sulci are normal in size and configuration. There is no intracranial mass, hemorrhage, extra-axial fluid collection, or midline shift. There is slight small vessel disease in the centra semiovale bilaterally. A small focus of decreased attenuation in the left lentiform nucleus likely represents an asymmetric sulcus. No acute infarct is demonstrable on this study. Vascular: No hyperdense vessel. There is calcification in each carotid siphon region. Skull: Burr hole defects in the left frontal and left parietal regions are stable. Bony calvarium otherwise appears intact and stable. Sinuses/Orbits: Visualized paranasal sinuses are clear. Visualized orbits appear symmetric bilaterally. Other:  Visualized mastoid air cells are clear. There is debris in the right external auditory canal. IMPRESSION: Slight periventricular small vessel disease, stable. No mass or hemorrhage. No acute infarct evident. Foci of arterial vascular calcification noted. Postoperative bony defects in the left frontal and parietal bones. No new bony defect. Probable cerumen in the right external auditory canal. Electronically Signed   By: Bretta Bang III M.D.   On: 03/03/2020 13:38   CT CERVICAL SPINE WO CONTRAST  Result Date: 03/05/2020 CLINICAL DATA:  Acute neck pain. Recent fall. EXAM: CT CERVICAL SPINE WITHOUT CONTRAST TECHNIQUE: Multidetector CT imaging of the cervical spine was performed without intravenous contrast. Multiplanar CT image reconstructions were also generated. COMPARISON:  Cervical spine CT dated 08/19/2019. FINDINGS: Alignment: Alignment is stable. No evidence of acute vertebral body subluxation. Skull base and vertebrae: New minimally displaced fracture at the anterior margin of the C4 vertebral body, LEFT of midline (coronal series 7, image 30; axial series 2, images 46 and 47). No other fracture line or displaced fracture fragment identified. Fixation hardware at the C5 through C7 levels appears intact and appropriately positioned. Facet joints are normally aligned. Soft tissues and spinal canal: There is prevertebral fluid which is new since previous CT of 08/19/2019. No canal hematoma. Disc levels: Expected osseous fusion at the C5 through C7 levels. Remaining disc spaces of  the cervical spine appear well maintained. Moderate central canal stenoses at the surgical levels due to posterior disc-osteophytic ossification. Additional degenerative hypertrophy of the uncovertebral and facet joints causing moderate to severe LEFT neural foramen stenosis at C3-4, moderate bilateral neural foramen stenosis at C4-5, moderate to severe bilateral neural foramen stenoses at C5-6 and moderate bilateral neural  foramen stenoses at C6-7. There may be associated nerve root impingement at multiple levels. Upper chest: No acute findings. Other: Bilateral carotid atherosclerosis. IMPRESSION: 1. New minimally displaced fracture at the anterior margin of the C4 vertebral body, LEFT of midline. There is no fracture extension into the posterior vertebral body suggesting a stable fracture. 2. Prevertebral fluid which is new since previous CT of 08/19/2019, almost certainly related to the new fracture at the anterior margin of the C4 vertebral body, and suspicious for associated injury/tear of the anterior longitudinal ligament. 3. Expected osseous fusion at the C5 through C7 levels. 4. Degenerative changes as detailed above. There may be associated nerve root impingement at multiple levels. 5. Bilateral carotid atherosclerosis. These results will be called to the ordering clinician or representative by the Radiologist Assistant, and communication documented in the PACS or [employer]. Electronically Signed   By: Bary Richard M.D.   On: 03/05/2020 14:35   MR CERVICAL SPINE W WO CONTRAST  Result Date: 03/17/2020 CLINICAL DATA:  Cervical spine fracture. MSSA bacteremia EXAM: MRI CERVICAL SPINE WITHOUT AND WITH CONTRAST TECHNIQUE: Multiplanar and multiecho pulse sequences of the cervical spine, to include the craniocervical junction and cervicothoracic junction, were obtained without and with intravenous contrast. CONTRAST:  6mL GADAVIST GADOBUTROL 1 MMOL/ML IV SOLN COMPARISON:  CT cervical spine 03/05/2020 FINDINGS: The examination is severely degraded by motion. There is an intermediate sized prevertebral effusion that extends the length of the cervical spine and measures 10 mm in thickness. I do not see evidence of discitis-osteomyelitis on the single sagittal series that is not terribly motion degraded (series 12). IMPRESSION: 1. Severely motion degraded examination. 2. No evidence of discitis-osteomyelitis, though  assessment is limited to a single sagittal STIR sequence. 3. Prevertebral effusion extending the length of the cervical spine, 10 mm in thickness. Electronically Signed   By: Deatra Robinson M.D.   On: 03/17/2020 23:37   CT HIP RIGHT WO CONTRAST  Result Date: 03/04/2020 CLINICAL DATA:  Right hip pain after fall EXAM: CT OF THE RIGHT HIP WITHOUT CONTRAST TECHNIQUE: Multidetector CT imaging of the right hip was performed according to the standard protocol. Multiplanar CT image reconstructions were also generated. COMPARISON:  X-ray 03/03/2020, 11/05/2016 FINDINGS: Bones/Joint/Cartilage No acute fracture. No dislocation. Stable area of sclerosis within the lateral aspect of the right femoral neck, unchanged from 11/05/2016, benign. No suspicious osseous lesion. No evidence of femoral head AVN by CT. Mild right hip joint space narrowing. Trace amount of joint fluid is evident. No joint effusion. Ligaments Suboptimally assessed by CT. Muscles and Tendons Tendinous structures are grossly intact within the limitations of CT. No muscle atrophy or fatty infiltration. Soft tissues There is layering fluid overlying the lateral aspect of the right hip and lower extremity without a well-defined collection or hematoma. There is also fluid underlying the iliotibial band, some of which may be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

## 2020-03-22 NOTE — Plan of Care (Signed)
  Problem: Education: Goal: Ability to describe self-care measures that may prevent or decrease complications (Diabetes Survival Skills Education) will improve Outcome: Progressing   Problem: Health Behavior/Discharge Planning: Goal: Ability to manage health-related needs will improve Outcome: Progressing   Problem: Metabolic: Goal: Ability to maintain appropriate glucose levels will improve Outcome: Progressing   

## 2020-03-22 NOTE — Progress Notes (Signed)
ID Lying in bed naked Says he is waiting   Patient Vitals for the past 24 hrs:  BP Temp Temp src Pulse Resp SpO2  03/22/20 1610 121/76 98.4 F (36.9 C) Oral 91 16 97 %  03/22/20 0723 (!) 151/94 98.4 F (36.9 C) Oral 87 -- 95 %  03/21/20 2355 (!) 152/89 99.3 F (37.4 C) Oral 88 16 100 %   No distress Chest b/l air entry Hss1s2 Abd soft   CBC Latest Ref Rng & Units 03/22/2020 03/21/2020 03/20/2020  WBC 4.0 - 10.5 K/uL 12.6(H) 12.7(H) 11.9(H)  Hemoglobin 13.0 - 17.0 g/dL 1.6(X) 8.9(L) 8.7(L)  Hematocrit 39.0 - 52.0 % 27.2(L) 27.6(L) 25.5(L)  Platelets 150 - 400 K/uL 648(H) 581(H) 477(H)     CMP Latest Ref Rng & Units 03/22/2020 03/21/2020 03/20/2020  Glucose 70 - 99 mg/dL 096(E) 454(U) 981(X)  BUN 8 - 23 mg/dL 23 17 91(Y)  Creatinine 0.61 - 1.24 mg/dL 7.82 9.56(O) 1.30  Sodium 135 - 145 mmol/L 134(L) 133(L) 134(L)  Potassium 3.5 - 5.1 mmol/L 4.3 4.1 3.8  Chloride 98 - 111 mmol/L 102 100 103  CO2 22 - 32 mmol/L 24 25 26   Calcium 8.9 - 10.3 mg/dL 7.9(L) 7.7(L) 7.8(L)  Total Protein 6.5 - 8.1 g/dL - - -  Total Bilirubin 0.3 - 1.2 mg/dL - - -  Alkaline Phos 38 - 126 U/L - - -  AST 15 - 41 U/L - - -  ALT 0 - 44 U/L - - -  WBC   Microbiology: 03/04/2020 blood culture staph aureus  03/06/2020 blood culture negative  Blood culture from 5/ 8 staph aureus  5/10 blood culture Negative     Staph aureus bacteremia in a patient who is using cocaine.  He denies IV drug use Concern for endocarditis and also concern for vertebral infection. He has fracture of the C4 vertebrae and the MRI done  showed prevertebral effusion.  There was no involvement of the bone or the disc but the film was limited by movement. We will have to treat this like a deep-seated infection with 6 weeks of IV antibiotics.04/26/20 Currently on nafcillin and white count is coming down from 32 to 12.6 Patient had left AMA twice before.  Very likely will have to be placed in a nursing facility or rehab to get his  IV antibiotics.  Cardiology has deferred TEE because of patient's overall situation.  C4 vertebral fracture.  Needs Aspen collar to be worn all the time.  Diabetes mellitus noncompliant with medication and hence gets admitted with DKA.  Cocaine abuse  Discussed the management with the care team

## 2020-03-22 NOTE — Progress Notes (Signed)
Nutrition Follow-up  DOCUMENTATION CODES:   Severe malnutrition in context of social or environmental circumstances  INTERVENTION:  Continue double protein portions at meals.  Continue Ensure Max Protein po BID, each supplement provides 150 kcal and 30 grams of protein.  Continue daily MVI.  NUTRITION DIAGNOSIS:   Severe Malnutrition related to social / environmental circumstances(hx substance abuse, inadequate oral intake) as evidenced by severe fat depletion, severe muscle depletion.  Ongoing.  GOAL:   Patient will meet greater than or equal to 90% of their needs  Progressing.  MONITOR:   PO intake, Supplement acceptance, Labs, Weight trends, I & O's, Skin  REASON FOR ASSESSMENT:   Consult Assessment of nutrition requirement/status  ASSESSMENT:   59 year old male with PMHx of HTN, substance abuse, DM, GERD, brain aneurysm admitted with DKA, MSSA bacteremia, AKI, metabolic encephalopathy.  Met with patient at bedside. He reports his appetite is improved and that he is eating well at meals. He had finished 100% of his breakfast t his morning at time of RD assessment. Per chart he is finishing 90-100% of his meals. He reports he enjoys the Ensure Max and would like to continue drinking these. Discussed patient's increased calorie and protein needs and encouraged ongoing adequate intake.  Medications reviewed and include: famotidine, Novolog 0-15 units TID, Novolog 0-5 units QHS, Novolog 12 units TID with meals, Lantus 30 units daily, MVI daily, Miralax, nafcillin.  Labs reviewed: CBG 45-449, Sodium 134.  Diet Order:   Diet Order            Diet Carb Modified Fluid consistency: Thin; Room service appropriate? 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  Estimated Nutritional Needs:   Kcal:  1800-2000  Protein:  90-100 grams  Fluid:  1.8-2 L/day  Jacklynn Barnacle, MS, RD, LDN Pager number available on Amion

## 2020-03-23 LAB — GLUCOSE, CAPILLARY
Glucose-Capillary: 199 mg/dL — ABNORMAL HIGH (ref 70–99)
Glucose-Capillary: 160 mg/dL — ABNORMAL HIGH (ref 70–99)
Glucose-Capillary: 86 mg/dL (ref 70–99)
Glucose-Capillary: 178 mg/dL — ABNORMAL HIGH (ref 70–99)
Glucose-Capillary: 127 mg/dL — ABNORMAL HIGH (ref 70–99)

## 2020-03-23 LAB — BASIC METABOLIC PANEL
Anion gap: 7 (ref 5–15)
BUN: 24 mg/dL — ABNORMAL HIGH (ref 8–23)
CO2: 25 mmol/L (ref 22–32)
Calcium: 7.9 mg/dL — ABNORMAL LOW (ref 8.9–10.3)
Chloride: 102 mmol/L (ref 98–111)
Creatinine, Ser: 0.61 mg/dL (ref 0.61–1.24)
GFR calc Af Amer: >60 mL/min (ref >60)
GFR calc non Af Amer: >60 mL/min (ref >60)
Glucose, Bld: 273 mg/dL — ABNORMAL HIGH (ref 70–99)
Potassium: 4.1 mmol/L (ref 3.5–5.1)
Sodium: 134 mmol/L — ABNORMAL LOW (ref 135–145)

## 2020-03-23 LAB — CBC
HCT: 28 % — ABNORMAL LOW (ref 39.0–52.0)
Hemoglobin: 9.1 g/dL — ABNORMAL LOW (ref 13.0–17.0)
MCH: 30 pg (ref 26.0–34.0)
MCHC: 32.5 g/dL (ref 30.0–36.0)
MCV: 92.4 fL (ref 80.0–100.0)
Platelets: 779 10*3/uL — ABNORMAL HIGH (ref 150–400)
RBC: 3.03 MIL/uL — ABNORMAL LOW (ref 4.22–5.81)
RDW: 14.2 % (ref 11.5–15.5)
WBC: 12.2 10*3/uL — ABNORMAL HIGH (ref 4.0–10.5)
nRBC: 0 % (ref 0.0–0.2)

## 2020-03-23 NOTE — Progress Notes (Signed)
Physical Therapy Treatment Patient Details Name: Spencer [name] MRN: [id number] DOB: 1960/11/24 Today's Date: 03/23/2020    History of Present Illness Pt admitted for MSSA bactermia and elevated BG. Pt with recent admission for similar issues, left AMA. HIstory includes DM, cocaine abuse, brain aneurysm, HTN, and GERD. Last admission with + C4 fx with Aspen collar in place.    PT Comments    Pt was long sitting in bed talking on phone upon arriving. He required max encouragement to participate in PT but was willing once requesting to use BR. He did not have cervical collar on upon arrival. He required max encouragement with education on importance of wearing for safety. Eventually agrees to wearing cervical collar. Therapist attempted to re-educate pt on spinal precautions but pt impulsively attempts to sit up and stand. Vcs for proper log roll technique but pt unable to adhere. RW + gait belt used for safety. He ambulated to BR and then to recliner. Unwilling to ambulate into hallway endless therapist agreed to take him outside. He was seated in recliner with cervical collar in place, call bell in reach, and chair alarm in place. PT will continue to follow pt per POC and progress as able per pt's willingness to participate. Recommend HHPT + 24 hour supervision at DC.       Follow Up Recommendations  Home health PT;Supervision for mobility/OOB     Equipment Recommendations  Rolling walker with 5" wheels    Recommendations for Other Services       Precautions / Restrictions Precautions Precautions: Fall Required Braces or Orthoses: Cervical Brace Cervical Brace: Hard collar Restrictions Weight Bearing Restrictions: No    Mobility  Bed Mobility Overal bed mobility: Needs Assistance Bed Mobility: Supine to Sit;Sidelying to Sit   Sidelying to sit: Min guard Supine to sit: Min assist     General bed mobility comments: Pt required max encouragement to wear cervical collar. Did agree  after several minutes. Pt cued for log roll R but 2/2 to pt's impulsivity and poor ability to follow desired task. sit up EOB without proper performance. He quickly attempts to stand stating " I need to use BR"  Transfers Overall transfer level: Needs assistance Equipment used: Rolling walker (2 wheeled) Transfers: Sit to/from Stand Sit to Stand: Min guard         General transfer comment: CGA for safety with vcs for improved technique. pt very impulsive and cussing alot throughout session. Sits in recliner after ambulating to BR without eccentric controll. Pt frustrated therapist wont take pt outside.  Ambulation/Gait Ambulation/Gait assistance: Min guard Gait Distance (Feet): 25 Feet Assistive device: Rolling walker (2 wheeled) Gait Pattern/deviations: Narrow base of support;Step-through pattern Gait velocity: impulsive with varied cadence. ecouraged decrease in cadence   General Gait Details: Pt ambulated to BR and urinated. ~ 25 ft. max encouragement to ambulate out into hallway after but pt very resistive. " I'm not going out in the hall if your not taking me outside"   Stairs             Wheelchair Mobility    Modified Rankin (Stroke Patients Only)       Balance                                            Cognition Arousal/Alertness: Awake/alert Behavior During Therapy: Agitated;Impulsive Overall Cognitive Status: Within Functional Limits  for tasks assessed                                 General Comments: Pt is alert but does need constant redirecting and cueing for safety. Needs alot of encouragement but did partcipate in limited compacity       Exercises      General Comments        Pertinent Vitals/Pain Pain Assessment: 0-10 Pain Score: 10-Worst pain ever Pain Location: R lateral thigh Pain Descriptors / Indicators: Discomfort Pain Intervention(s): Limited activity within patient's tolerance;Monitored during  session;Premedicated before session;Repositioned    Home Living                      Prior Function            PT Goals (current goals can now be found in the care plan section) Acute Rehab PT Goals Patient Stated Goal: To go home Progress towards PT goals: Progressing toward goals    Frequency    Min 2X/week      PT Plan Current plan remains appropriate    Co-evaluation              AM-PAC PT "6 Clicks" Mobility   Outcome Measure  Help needed turning from your back to your side while in a flat bed without using bedrails?: A Little Help needed moving from lying on your back to sitting on the side of a flat bed without using bedrails?: A Little Help needed moving to and from a bed to a chair (including a wheelchair)?: A Little Help needed standing up from a chair using your arms (e.g., wheelchair or bedside chair)?: A Little Help needed to walk in hospital room?: A Little Help needed climbing 3-5 steps with a railing? : A Little 6 Click Score: 18    End of Session Equipment Utilized During Treatment: Cervical collar;Gait belt Activity Tolerance: Patient tolerated treatment well Patient left: in chair;with call bell/phone within reach;with chair alarm set Nurse Communication: Mobility status PT Visit Diagnosis: History of falling (Z91.81);Difficulty in walking, not elsewhere classified (R26.2);Pain Pain - Right/Left: Right Pain - part of body: Hip     Time: 5643-3295 PT Time Calculation (min) (ACUTE ONLY): 16 min  Charges:  $Therapeutic Activity: 8-22 mins                     Julaine Fusi PTA 03/23/20, 12:14 PM

## 2020-03-23 NOTE — Progress Notes (Signed)
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 parameters are consistent with Grade I diastolic dysfunction (impaired relaxation).  2. Right ventricular systolic function is normal. The right ventricular size is normal.  3. The mitral valve is grossly normal. Trivial mitral valve regurgitation.  4. The aortic valve is grossly normal. Aortic valve regurgitation is not visualized.  5. Aortic dilatation noted. There is borderline dilatation of the aortic root measuring 37 mm. FINDINGS  Left Ventricle: Left ventricular ejection fraction, by estimation, is 60 to 65%. The left ventricle has normal function. The left ventricle has no regional wall motion abnormalities. The left ventricular internal cavity size was normal in size. There is  mild left ventricular hypertrophy. Left ventricular diastolic parameters are consistent with Grade I diastolic dysfunction (impaired relaxation). Right Ventricle: The right ventricular size is normal. No increase in right ventricular wall thickness. Right ventricular systolic function is normal. Left Atrium: Left atrial size was normal in size. Right Atrium: Right atrial size  was normal in size. Pericardium: There is no evidence of pericardial effusion. Mitral Valve: The mitral valve is grossly normal. Trivial mitral valve regurgitation. MV peak gradient, 4.4 mmHg. The mean mitral valve gradient is 2.0 mmHg. Tricuspid Valve: The tricuspid valve is grossly normal. Tricuspid valve regurgitation is trivial. Aortic Valve: The aortic valve is grossly normal. Aortic valve regurgitation is not visualized. Aortic valve mean gradient measures 4.0 mmHg. Aortic valve peak gradient measures 8.5 mmHg. Aortic valve area, by VTI measures 3.21 cm. Pulmonic Valve: The pulmonic valve was not well visualized. Pulmonic valve regurgitation is trivial. Aorta: Aortic dilatation noted. There is borderline dilatation of the aortic root measuring 37 mm. IAS/Shunts: The interatrial septum was not assessed.  LEFT VENTRICLE PLAX 2D LVIDd:         4.78 cm  Diastology LVIDs:         3.06 cm  LV e' lateral:   11.10 cm/s LV  PW:         1.08 cm  LV E/e' lateral: 6.2 LV IVS:        0.99 cm  LV e' medial:    7.72 cm/s LVOT diam:     2.30 cm  LV E/e' medial:  8.9 LV SV:         72 LV SV Index:   41 LVOT Area:     4.15 cm  RIGHT VENTRICLE RV Basal diam:  2.78 cm LEFT ATRIUM             Index       RIGHT ATRIUM           Index LA diam:        3.10 cm 1.75 cm/m  RA Area:     13.90 cm LA Vol (A2C):   32.9 ml 18.55 ml/m RA Volume:   31.30 ml  17.65 ml/m LA Vol (A4C):   27.7 ml 15.62 ml/m LA Biplane Vol: 32.5 ml 18.33 ml/m  AORTIC VALVE                   PULMONIC VALVE AV Area (Vmax):    3.24 cm    PV Vmax:       1.25 m/s AV Area (Vmean):   2.99 cm    PV Vmean:      84.900 cm/s AV Area (VTI):     3.21 cm    PV VTI:        0.211 m AV Vmax:           146.00 cm/s PV Peak grad:  6.2 mmHg AV Vmean:          93.400 cm/s PV Mean grad:  3.0 mmHg AV VTI:            0.225 m AV Peak Grad:      8.5 mmHg AV Mean Grad:      4.0 mmHg LVOT Vmax:         114.00 cm/s LVOT Vmean:        67.300 cm/s LVOT VTI:          0.174 m LVOT/AV VTI  ratio: 0.77  AORTA Ao Root diam: 3.70 cm MITRAL VALVE MV Area (PHT): 5.38 cm    SHUNTS MV Peak grad:  4.4 mmHg    Systemic VTI:  0.17 m MV Mean grad:  2.0 mmHg    Systemic Diam: 2.30 cm MV Vmax:       1.05 m/s MV Vmean:      58.9 cm/s MV Decel Time: 141 msec MV E velocity: 68.40 cm/s MV A velocity: 91.70 cm/s MV E/A ratio:  0.75 Harold Hedge MD Electronically signed by Harold Hedge MD Signature Date/Time: 03/06/2020/8:03:57 AM    Final    DG HIP UNILAT W OR W/O PELVIS 2-3 VIEWS RIGHT  Result Date: 03/03/2020 CLINICAL DATA:  Pain following fall EXAM: DG HIP (WITH OR WITHOUT PELVIS) 2-3V RIGHT COMPARISON:  November 05, 2016 pelvis radiograph FINDINGS: Frontal pelvis as well as frontal and lateral right hip images obtained. No fracture or dislocation. There is mild symmetric narrowing of each hip joint. No erosive change. There is a stable sclerotic focus in the lateral right femoral neck measuring 1.5 x 1.5 cm. IMPRESSION: No fracture or dislocation. Slight symmetric narrowing of each hip joint. Stable sclerotic focus in the right femoral neck which potentially may represent a bone island. Stability since 2017 is felt to be indicative of benign etiology. Electronically  Signed   By: Bretta Bang III M.D.   On: 03/03/2020 13:35     Time Spent in minutes  30     Laverna Peace M.D on 03/23/2020 at 10:22 PM  To page go to [email or web address] - password [facility]

## 2020-03-24 LAB — CBC
HCT: 26.7 % — ABNORMAL LOW (ref 39.0–52.0)
Hemoglobin: 8.5 g/dL — ABNORMAL LOW (ref 13.0–17.0)
MCH: 29.8 pg (ref 26.0–34.0)
MCHC: 31.8 g/dL (ref 30.0–36.0)
MCV: 93.7 fL (ref 80.0–100.0)
Platelets: 762 10*3/uL — ABNORMAL HIGH (ref 150–400)
RBC: 2.85 MIL/uL — ABNORMAL LOW (ref 4.22–5.81)
RDW: 14.5 % (ref 11.5–15.5)
WBC: 12.5 10*3/uL — ABNORMAL HIGH (ref 4.0–10.5)
nRBC: 0 % (ref 0.0–0.2)

## 2020-03-24 LAB — GLUCOSE, CAPILLARY
Glucose-Capillary: 542 mg/dL (ref 70–99)
Glucose-Capillary: 467 mg/dL — ABNORMAL HIGH (ref 70–99)
Glucose-Capillary: 274 mg/dL — ABNORMAL HIGH (ref 70–99)
Glucose-Capillary: 228 mg/dL — ABNORMAL HIGH (ref 70–99)
Glucose-Capillary: 268 mg/dL — ABNORMAL HIGH (ref 70–99)

## 2020-03-24 LAB — BASIC METABOLIC PANEL
Anion gap: 8 (ref 5–15)
BUN: 31 mg/dL — ABNORMAL HIGH (ref 8–23)
CO2: 20 mmol/L — ABNORMAL LOW (ref 22–32)
Calcium: 7.6 mg/dL — ABNORMAL LOW (ref 8.9–10.3)
Chloride: 103 mmol/L (ref 98–111)
Creatinine, Ser: 0.8 mg/dL (ref 0.61–1.24)
GFR calc Af Amer: >60 mL/min (ref >60)
GFR calc non Af Amer: >60 mL/min (ref >60)
Glucose, Bld: 599 mg/dL (ref 70–99)
Potassium: 4.2 mmol/L (ref 3.5–5.1)
Sodium: 131 mmol/L — ABNORMAL LOW (ref 135–145)

## 2020-03-24 MED ORDER — SODIUM CHLORIDE 0.9 % IV SOLN
12.0000 g | INTRAVENOUS | Status: DC
  Filled 2020-03-24: qty 12000

## 2020-03-24 MED ORDER — SODIUM CHLORIDE 0.9 % IV SOLN
12.0000 g | INTRAVENOUS | Status: DC
  Administered 2020-03-25: 12 g via INTRAVENOUS
  Filled 2020-03-24 (×3): qty 12000

## 2020-03-24 NOTE — Progress Notes (Signed)
PROGRESS NOTE    Spencer [name]  NFA:[id number] DOB: 04/11/1961 DOA: 03/13/2020 PCP: Patient, No Pcp Per      Assessment & Plan:   Principal Problem:   MSSA bacteremia Active Problems:   DKA (diabetic ketoacidoses) ([place])   Hypertension   Diabetic neuropathy ([place])   DM2 (diabetes mellitus, type 2) ([place])   History of cocaine abuse ([place])   Personal history of subdural hematoma   AKI (acute kidney injury) ([place])   GERD (gastroesophageal reflux disease)   C4 cervical fracture ([facility])   Cigarette smoker   Pressure injury of skin   Lack of medical decision-making capacity: evaluated by psychiatry on 5/15 patient has impaired medical decision-making capacity, currently agreeable to continuing inpatient care. No IVC in place, cannot hold pt against his will as per CM  DM2: poorly controlled, A1c 12.6. Presented in DKA in setting of poor insulin adherence. Continue on lantus, novolog, & SSI w/ accuchecks   MSSA bacteremia: recurrent in the setting of leaving AMA on previous hospital stay, admitting blood cultures on 5/8+ for MSSA.  TTE unremarkable, patient initially declined TEE on 5/13 discussed with cardiology, also some concern of moderate sedation, also high risk given cervical fracture. Patient does not have decision-making capacity. Repeat blood culture on 5/10 no growth x5 days Continue nafcillin for 6 total weeks of therapy per ID recommendations  Intermittent episodes of agitation: psych recommends low-dose Haldol 2 mg in the future  Minimally displaced C4 fracture: recommendation to wear Aspen collar at all times however has been declining it. No symptoms of cervical myelopathy. Neurosurgery previously evaluated, patient okay to undergo TEE as long as the Aspen collar remains in place during the entirety of the procedure. Outpatient follow-up with neurosurgery clinic Lonell Face, NP)  AKI:  prerenal etiology related to dehydration related to DKA on admission.  Resolved  Cocaine abuse: UDS positive for cocaine on admission. Illicit drug abuse cessation counseling   Hypertension: continue home amlodipine. Held losartan on admission due to AKI  Thrombocytosis: etiology unclear, possibly reactive. Will continue to monitor  Leukocytosis: likely secondary to MSSA bacteremia. Continue on IV abxs    DVT prophylaxis: lovenox  Code Status: full Family Communication:  Disposition Plan: will need 6 weeks of IV abxs as per ID. PT recs home health but pt cannot do IV abxs at home w/ hx of drug abuse so will have to stay until IV abxs are complete.    Status is: Inpatient  Remains inpatient appropriate because:IV treatments appropriate due to intensity of illness or inability to take PO , unsafe d/c plan   Dispo: The patient is from: Home              Anticipated d/c is to: SNF vs home health vs AMA as pt has left AMA              before              Anticipated d/c date is: > 3 days              Patient currently is not medically stable to d/c.    Consultants:  ID Psych Cardio  Procedures:    Antimicrobials: nafcillin    Subjective: Pt c/o buttocks pain  Objective: Vitals:   03/23/20 0742 03/23/20 1115 03/23/20 1625 03/23/20 2337  BP: (!) 154/101 135/83 137/78 (!) 146/88  Pulse: 90 89 89 90  Resp:   16 18  Temp: 98.2 F (36.8 C)  98.4 F (  36.9 C) 99.2 F (37.3 C)  TempSrc: Oral  Oral Oral  SpO2: 97%  97% 99%  Weight:      Height:        Intake/Output Summary (Last 24 hours) at 03/24/2020 0744 Last data filed at 03/24/2020 0630 Gross per 24 hour  Intake 840 ml  Output 2200 ml  Net -1360 ml   Filed Weights   03/13/20 1654 03/13/20 2055  Weight: 59 kg 60.3 kg    Examination:  General exam: Appears calm and comfortable  Respiratory system: decreased breath sounds b/l otherwise clear.  Cardiovascular system: S1 & S2 +. No rubs, gallops or clicks.  Gastrointestinal system: Abdomen is nondistended, soft and nontender.  Normal bowel sounds heard. Central nervous system: Alert and oriented. Moves all 4 extremities  Psychiatry: Judgement and insight appear abnormal. Agitated & frustrated    Data Reviewed: I have personally reviewed following labs and imaging studies  CBC: Recent Labs  Lab 03/19/20 0518 03/20/20 0432 03/21/20 0656 03/22/20 0439 03/23/20 0834  WBC 16.4* 11.9* 12.7* 12.6* 12.2*  HGB 9.4* 8.7* 8.9* 9.2* 9.1*  HCT 27.1* 25.5* 27.6* 27.2* 28.0*  MCV 88.9 88.9 92.0 88.9 92.4  PLT 464* 477* 581* 648* 779*   Basic Metabolic Panel: Recent Labs  Lab 03/18/20 0439 03/18/20 0439 03/19/20 0518 03/20/20 0432 03/21/20 0656 03/22/20 0439 03/23/20 0834  NA 135   < > 135 134* 133* 134* 134*  K 3.2*   < > 4.1 3.8 4.1 4.3 4.1  CL 100   < > 102 103 100 102 102  CO2 28   < > 26 26 25 24 25   GLUCOSE 281*   < > 274* 229* 263* 331* 273*  BUN 25*   < > 24* 25* 17 23 24*  CREATININE 0.58*   < > 0.60* 0.62 0.54* 0.61 0.61  CALCIUM 8.1*   < > 8.1* 7.8* 7.7* 7.9* 7.9*  MG 2.1  --  2.1 2.2  --   --   --   PHOS 2.9  --  2.1* 2.6  --   --   --    < > = values in this interval not displayed.   GFR: Estimated Creatinine Clearance: 82.7 mL/min (by C-G formula based on SCr of 0.61 mg/dL). Liver Function Tests: No results for input(s): AST, ALT, ALKPHOS, BILITOT, PROT, ALBUMIN in the last 168 hours. No results for input(s): LIPASE, AMYLASE in the last 168 hours. No results for input(s): AMMONIA in the last 168 hours. Coagulation Profile: No results for input(s): INR, PROTIME in the last 168 hours. Cardiac Enzymes: No results for input(s): CKTOTAL, CKMB, CKMBINDEX, TROPONINI in the last 168 hours. BNP (last 3 results) No results for input(s): PROBNP in the last 8760 hours. HbA1C: No results for input(s): HGBA1C in the last 72 hours. CBG: Recent Labs  Lab 03/23/20 0744 03/23/20 1142 03/23/20 1545 03/23/20 1722 03/23/20 2216  GLUCAP 199* 160* 86 178* 127*   Lipid Profile: No results for  input(s): CHOL, HDL, LDLCALC, TRIG, CHOLHDL, LDLDIRECT in the last 72 hours. Thyroid Function Tests: No results for input(s): TSH, T4TOTAL, FREET4, T3FREE, THYROIDAB in the last 72 hours. Anemia Panel: No results for input(s): VITAMINB12, FOLATE, FERRITIN, TIBC, IRON, RETICCTPCT in the last 72 hours. Sepsis Labs: No results for input(s): PROCALCITON, LATICACIDVEN in the last 168 hours.  Recent Results (from the past 240 hour(s))  Culture, blood (routine x 2)     Status: None   Collection Time: 03/15/20  6:55 AM  Specimen: BLOOD LEFT HAND  Result Value Ref Range Status   Specimen Description BLOOD LEFT HAND  Final   Special Requests   Final    BOTTLES DRAWN AEROBIC AND ANAEROBIC Blood Culture adequate volume   Culture   Final    NO GROWTH 5 DAYS Performed at [facility], [address], [place], [place] [address]    Report Status 03/20/2020 FINAL  Final  Culture, blood (routine x 2)     Status: None   Collection Time: 03/15/20  6:55 AM   Specimen: BLOOD LEFT FOREARM  Result Value Ref Range Status   Specimen Description BLOOD LEFT FOREARM  Final   Special Requests   Final    BOTTLES DRAWN AEROBIC AND ANAEROBIC Blood Culture adequate volume   Culture   Final    NO GROWTH 5 DAYS Performed at [facility], [address], [place], [place] [address]    Report Status 03/20/2020 FINAL  Final         Radiology Studies: No results found.      Scheduled Meds: . amLODipine  10 mg Oral Daily  . aspirin EC  81 mg Oral Daily  . enoxaparin (LOVENOX) injection  40 mg Subcutaneous Q24H  . famotidine  20 mg Oral Daily  . insulin aspart  0-15 Units Subcutaneous TID WC  . insulin aspart  0-5 Units Subcutaneous QHS  . insulin aspart  12 Units Subcutaneous TID WC  . insulin glargine  30 Units Subcutaneous Daily  . losartan  50 mg Oral Daily  . methocarbamol  500 mg Oral TID  . multivitamin with minerals  1 tablet Oral Daily  . polyethylene glycol  17 g Oral  Daily  . Ensure Max Protein  11 oz Oral BID BM   Continuous Infusions: . sodium chloride Stopped (03/16/20 0800)  . nafcillin (NAFCIL) continuous infusion 12 g (03/23/20 0640)     LOS: 11 days    Time spent: 34 mins     Charise Killian, MD [facility] Pager [phone]  If 7PM-7AM, please contact night-coverage [email or web address] 03/24/2020, 7:44 AM

## 2020-03-24 NOTE — Progress Notes (Signed)
OT Cancellation Note  Patient Details Name: [name] MRN: [id number] DOB: Aug 11, 1961   Cancelled Treatment:    Reason Eval/Treat Not Completed: Patient declined, no reason specified. Upon arrival pt reclined resting in bed, easily woken. OT offered MAX encouragement to engage in ADL activity - pt appears upset at request to don c-collar to engage in mobility. Pt remained reclined c eyes closed and snoring loudly until OT stated will follow up at a later date to engage in therapy. Pt said "goodbye" pleasantly mid-snore as OT left.   Kathie Dike, M.S. OTR/L  03/24/20, 2:25 PM

## 2020-03-24 NOTE — TOC Progression Note (Signed)
Transition of Care [facility]) - Progression Note    Patient Details  Name: Spencer [name] MRN: [id number] Date of Birth: Dec 10, 1960  Transition of [facility]) CM/SW Contact  Barrie Dunker, RN Phone Number: 03/24/2020, 2:04 PM  Clinical Narrative:    Spoke with the patient's sister Spencer [name], She has already been working with APS to try and get guardianship of the patient, they have came out to the home a few times already, she is going to the courthouse tomorrow to petition to get guardianship as the patient has been deemed to not have the capacity to make decisions, She asked that I request the bed side nurse to get the patient a shower, I notified the bedside nurse. She stated that the patient got a bedbath this morning from NT, The sister stated that she will bring any temporary guardianship paperwork once she gets it tomorrow.   Expected Discharge Plan: Home/Self Care Barriers to Discharge: Continued Medical Work up  Expected Discharge Plan and Services Expected Discharge Plan: Home/Self Care In-house Referral: Clinical Social Work Discharge Planning Services: CM Consult   Living arrangements for the past 2 months: Single Family Home                 DME Arranged: Walker rolling DME Agency: [employer] Date DME Agency Contacted: 03/18/20 Time DME Agency Contacted: 1100 Representative spoke with at DME Agency: Zack             Social Determinants of Health (SDOH) Interventions    Readmission Risk Interventions Readmission Risk Prevention Plan 03/05/2020 02/16/2020 12/17/2019  Transportation Screening Complete Complete Complete  PCP or Specialist Appt within 3-5 Days - - -  HRI or Home Care Consult - - -  HRI or Home Care Consult comments - - -  Palliative Care Screening - - -  Medication Review (RN Care Manager) Complete Complete Complete  PCP or Specialist appointment within 3-5 days of discharge Patient refused Complete Patient refused  HRI or Home Care Consult Complete  Complete Patient refused  SW Recovery Care/Counseling Consult - - -  SW Consult Not Complete Comments - - -  Palliative Care Screening - - Not Applicable  Skilled Nursing Facility Complete Complete Not Applicable  Some recent data might be hidden

## 2020-03-25 LAB — GLUCOSE, CAPILLARY
Glucose-Capillary: 171 mg/dL — ABNORMAL HIGH (ref 70–99)
Glucose-Capillary: 182 mg/dL — ABNORMAL HIGH (ref 70–99)
Glucose-Capillary: 114 mg/dL — ABNORMAL HIGH (ref 70–99)
Glucose-Capillary: 232 mg/dL — ABNORMAL HIGH (ref 70–99)

## 2020-03-25 LAB — CBC
HCT: 25.5 % — ABNORMAL LOW (ref 39.0–52.0)
Hemoglobin: 8.2 g/dL — ABNORMAL LOW (ref 13.0–17.0)
MCH: 29.8 pg (ref 26.0–34.0)
MCHC: 32.2 g/dL (ref 30.0–36.0)
MCV: 92.7 fL (ref 80.0–100.0)
Platelets: 737 10*3/uL — ABNORMAL HIGH (ref 150–400)
RBC: 2.75 MIL/uL — ABNORMAL LOW (ref 4.22–5.81)
RDW: 14.6 % (ref 11.5–15.5)
WBC: 11.6 10*3/uL — ABNORMAL HIGH (ref 4.0–10.5)
nRBC: 0 % (ref 0.0–0.2)

## 2020-03-25 LAB — BASIC METABOLIC PANEL
Anion gap: 7 (ref 5–15)
BUN: 24 mg/dL — ABNORMAL HIGH (ref 8–23)
CO2: 23 mmol/L (ref 22–32)
Calcium: 8.1 mg/dL — ABNORMAL LOW (ref 8.9–10.3)
Chloride: 107 mmol/L (ref 98–111)
Creatinine, Ser: 0.5 mg/dL — ABNORMAL LOW (ref 0.61–1.24)
GFR calc Af Amer: >60 mL/min (ref >60)
GFR calc non Af Amer: >60 mL/min (ref >60)
Glucose, Bld: 196 mg/dL — ABNORMAL HIGH (ref 70–99)
Potassium: 3.7 mmol/L (ref 3.5–5.1)
Sodium: 137 mmol/L (ref 135–145)

## 2020-03-25 MED ORDER — SODIUM CHLORIDE 0.9 % IV SOLN
12.0000 g | INTRAVENOUS | Status: DC
  Administered 2020-03-26 – 2020-04-27 (×33): 12 g via INTRAVENOUS
  Filled 2020-03-25 (×18): qty 12000
  Filled 2020-03-25: qty 6000
  Filled 2020-03-25 (×5): qty 12000
  Filled 2020-03-25: qty 10000
  Filled 2020-03-25 (×15): qty 12000

## 2020-03-25 NOTE — Progress Notes (Signed)
Physical Therapy Treatment Patient Details Name: Spencer [name] MRN: [id number] DOB: 02/19/1961 Today's Date: 03/25/2020    History of Present Illness Pt admitted for MSSA bactermia and elevated BG. Pt with recent admission for similar issues, left AMA. HIstory includes DM, cocaine abuse, brain aneurysm, HTN, and GERD. Last admission with + C4 fx with Aspen collar in place.    PT Comments    Pt was asleep in supine, but easily awakes. He agrees to OOB activity with encouragement. Allows therapist to apply cervical collar while in bed. Tried to donn independently but unable and becomes frustrated requesting therapist to just do it. He was able to exit R side of bed with with CGA + max vsc for performing log roll technique. Pt unable to recite spinal precautions and due to impulsivity needs a lot of cues to adhere. He was able to stand to IV pole and ambulate 100 ft with +1 UE support only. Slight unsteadiness throughout gait training however no therapist intervention to correct. Distances limited by pt needing to urinate. He has no symptoms of fatigue but after urinating was unwilling to ambulate > distances and requested to retunr to bed. Supervision required to return to supine safely. Bed alarm in place, call bell in reach, and pt instructed in use of call bell to notify RN staff if he wants to get OOB. Therapist educated pt on importance of increasing activity throughout the day. Acute PT will continue to follow per POC. Pt will need supervision/assistance at DC + HHPT to address balance deficits and improve overall safe functional mobility.    Follow Up Recommendations  Home health PT;Supervision for mobility/OOB     Equipment Recommendations  Other (comment)(pt has personal RW in room)    Recommendations for Other Services       Precautions / Restrictions Precautions Precautions: Fall Required Braces or Orthoses: Cervical Brace Cervical Brace: Hard collar(aspen) Restrictions Weight  Bearing Restrictions: No    Mobility  Bed Mobility Overal bed mobility: Needs Assistance Bed Mobility: Rolling;Supine to Sit;Sit to Supine Rolling: Min guard Sidelying to sit: Min guard Supine to sit: Min guard Sit to supine: Supervision Sit to sidelying: Supervision General bed mobility comments: Pt is impulsive and requires constant vcs for proper log roll technique. He was willing to wear cervical brace and was applied with pt in supine.   Transfers Overall transfer level: Needs assistance Equipment used: (pushed IV pole) Transfers: Sit to/from Stand Sit to Stand: Min guard         General transfer comment: CGA for safety 2/2 to pt's impulsivity. used gait belt but pt was able to perform without physical lifting assistance  Ambulation/Gait Ambulation/Gait assistance: Min guard Gait Distance (Feet): 100 Feet Assistive device: IV Pole Gait Pattern/deviations: Narrow base of support;Step-through pattern Gait velocity: varied cadence 2/2 to impulsivity   General Gait Details: pt ambulated 100 ft with +1 UE support on IV pole. limited distance 2/2 to pt requesting to use BR. After urinating, pt unwilling to ambulate back into hall.   Stairs             Wheelchair Mobility    Modified Rankin (Stroke Patients Only)       Balance Overall balance assessment: History of Falls;Needs assistance Sitting-balance support: Feet supported Sitting balance-Leahy Scale: Good Sitting balance - Comments: no LOB in sitting with reaching outside BOS   Standing balance support: Single extremity supported Standing balance-Leahy Scale: Fair Standing balance comment: has episodes of unsteadiness but does not require  therapist intervention                            Cognition Arousal/Alertness: Awake/alert Behavior During Therapy: WFL for tasks assessed/performed;Impulsive Overall Cognitive Status: History of cognitive impairments - at baseline(per sister)                                  General Comments: Pt is alert but does need constant redirecting and cueing for safety. Needs alot of encouragement but did partcipate in limited compacity       Exercises      General Comments        Pertinent Vitals/Pain Pain Assessment: 0-10 Pain Score: 3  Faces Pain Scale: Hurts a little bit Pain Location: anterior cervical/neck region Pain Descriptors / Indicators: Discomfort Pain Intervention(s): Limited activity within patient's tolerance;Monitored during session    Home Living                      Prior Function            PT Goals (current goals can now be found in the care plan section) Acute Rehab PT Goals Patient Stated Goal: To go home Progress towards PT goals: Progressing toward goals    Frequency    Min 2X/week      PT Plan Current plan remains appropriate    Co-evaluation              AM-PAC PT "6 Clicks" Mobility   Outcome Measure  Help needed turning from your back to your side while in a flat bed without using bedrails?: A Little Help needed moving from lying on your back to sitting on the side of a flat bed without using bedrails?: A Little Help needed moving to and from a bed to a chair (including a wheelchair)?: A Little Help needed standing up from a chair using your arms (e.g., wheelchair or bedside chair)?: A Little Help needed to walk in hospital room?: A Little Help needed climbing 3-5 steps with a railing? : A Little 6 Click Score: 18    End of Session Equipment Utilized During Treatment: Cervical collar;Gait belt Activity Tolerance: Patient tolerated treatment well Patient left: in bed;with call bell/phone within reach;with bed alarm set Nurse Communication: Mobility status PT Visit Diagnosis: History of falling (Z91.81);Difficulty in walking, not elsewhere classified (R26.2);Pain Pain - Right/Left: Right Pain - part of body: Hip     Time: 1125-1140 PT Time Calculation  (min) (ACUTE ONLY): 15 min  Charges:  $Gait Training: 8-22 mins                     Jetta Lout PTA 03/25/20, 12:42 PM

## 2020-03-25 NOTE — Progress Notes (Signed)
PROGRESS NOTE    Spencer [name]  VOJ:[id number] DOB: Dec 24, 1960 DOA: 03/13/2020 PCP: Patient, No Pcp Per      Assessment & Plan:   Principal Problem:   MSSA bacteremia Active Problems:   DKA (diabetic ketoacidoses) ([place])   Hypertension   Diabetic neuropathy ([place])   DM2 (diabetes mellitus, type 2) ([place])   History of cocaine abuse ([place])   Personal history of subdural hematoma   AKI (acute kidney injury) ([place])   GERD (gastroesophageal reflux disease)   C4 cervical fracture ([facility])   Cigarette smoker   Pressure injury of skin   Lack of medical decision-making capacity: evaluated by psychiatry on 5/15 patient has impaired medical decision-making capacity, currently agreeable to continuing inpatient care. No IVC in place, cannot hold pt against his will as per CM. Pt's sister is working on getting legal guardianship over this pt  DM2: poorly controlled, A1c 12.6. Presented in DKA in setting of poor insulin adherence. Continue on lantus, novolog, & SSI w/ accuchecks   MSSA bacteremia: recurrent in the setting of leaving AMA on previous hospital stay, admitting blood cultures on 5/8+ for MSSA.  TTE unremarkable, patient initially declined TEE on 5/13 discussed with cardiology, also some concern of moderate sedation, also high risk given cervical fracture. Patient does not have decision-making capacity. Repeat blood culture on 03/15/20 NGTD x5 days. Continue nafcillin for 6 total weeks until 04/26/20 as per ID recommendations  Intermittent episodes of agitation: psych recommends low-dose Haldol 2 mg in the future  Minimally displaced C4 fracture: recommendation to wear Aspen collar at all times however pt has been declining it. No symptoms of cervical myelopathy. Neurosurgery previously evaluated, patient okay to undergo TEE as long as the Aspen collar remains in place during the entirety of the procedure. Outpatient follow-up with neurosurgery clinic Lonell Face, NP)  AKI:  prerenal  etiology related to dehydration related to DKA on admission. Resolved  Cocaine abuse: UDS positive for cocaine on admission. Illicit drug abuse cessation counseling   Hypertension: continue home amlodipine. Will restart losartan tomorrow   Thrombocytosis: etiology unclear, possibly reactive. Will continue to monitor  Leukocytosis: likely secondary to MSSA bacteremia. Continue on IV abxs    DVT prophylaxis: lovenox  Code Status: full Family Communication:  Disposition Plan: will need 6 weeks of IV abxs as per ID. PT recs home health but pt cannot do IV abxs at home w/ hx of drug abuse so will have to stay until IV abxs are complete.    Status is: Inpatient  Remains inpatient appropriate because:IV treatments appropriate due to intensity of illness or inability to take PO , unsafe d/c plan   Dispo: The patient is from: Home              Anticipated d/c is to: SNF vs home health vs AMA as pt has left AMA                     before              Anticipated d/c date is: > 3 days              Patient currently is not medically stable to d/c.    Consultants:  ID Psych Cardio  Procedures:    Antimicrobials: nafcillin    Subjective: Pt c/o fatigue Objective: Vitals:   03/23/20 2337 03/24/20 0747 03/24/20 1624 03/24/20 2334  BP: (!) 146/88 (!) 161/94 127/79 (!) 152/85  Pulse: 90 (!) 101  82 86  Resp: 18 20 19 18   Temp: 99.2 F (37.3 C) 98.3 F (36.8 C) 98.3 F (36.8 C) 98.2 F (36.8 C)  TempSrc: Oral   Oral  SpO2: 99% 100% 98% 99%  Weight:      Height:        Intake/Output Summary (Last 24 hours) at 03/25/2020 0728 Last data filed at 03/25/2020 0644 Gross per 24 hour  Intake 240 ml  Output 2950 ml  Net -2710 ml   Filed Weights   03/13/20 1654 03/13/20 2055  Weight: 59 kg 60.3 kg    Examination:  General exam: Appears calm and comfortable  Respiratory system: diminished breath sounds b/l otherwise clear.  Cardiovascular system: S1 & S2 +. No rubs, gallops  or clicks.  Gastrointestinal system: Abdomen is nondistended, soft and nontender. Hypoactive bowel sounds heard. Central nervous system: Alert and oriented. Moves all 4 extremities  Psychiatry: Judgement and insight appear abnormal. Flat mood and affect     Data Reviewed: I have personally reviewed following labs and imaging studies  CBC: Recent Labs  Lab 03/21/20 0656 03/22/20 0439 03/23/20 0834 03/24/20 0838 03/25/20 0658  WBC 12.7* 12.6* 12.2* 12.5* 11.6*  HGB 8.9* 9.2* 9.1* 8.5* 8.2*  HCT 27.6* 27.2* 28.0* 26.7* 25.5*  MCV 92.0 88.9 92.4 93.7 92.7  PLT 581* 648* 779* 762* 737*   Basic Metabolic Panel: Recent Labs  Lab 03/19/20 0518 03/19/20 0518 03/20/20 0432 03/20/20 0432 03/21/20 0656 03/22/20 0439 03/23/20 0834 03/24/20 0838 03/25/20 0658  NA 135   < > 134*   < > 133* 134* 134* 131* 137  K 4.1   < > 3.8   < > 4.1 4.3 4.1 4.2 3.7  CL 102   < > 103   < > 100 102 102 103 107  CO2 26   < > 26   < > 25 24 25  20* 23  GLUCOSE 274*   < > 229*   < > 263* 331* 273* 599* 196*  BUN 24*   < > 25*   < > 17 23 24* 31* 24*  CREATININE 0.60*   < > 0.62   < > 0.54* 0.61 0.61 0.80 0.50*  CALCIUM 8.1*   < > 7.8*   < > 7.7* 7.9* 7.9* 7.6* 8.1*  MG 2.1  --  2.2  --   --   --   --   --   --   PHOS 2.1*  --  2.6  --   --   --   --   --   --    < > = values in this interval not displayed.   GFR: Estimated Creatinine Clearance: 82.7 mL/min (A) (by C-G formula based on SCr of 0.5 mg/dL (L)). Liver Function Tests: No results for input(s): AST, ALT, ALKPHOS, BILITOT, PROT, ALBUMIN in the last 168 hours. No results for input(s): LIPASE, AMYLASE in the last 168 hours. No results for input(s): AMMONIA in the last 168 hours. Coagulation Profile: No results for input(s): INR, PROTIME in the last 168 hours. Cardiac Enzymes: No results for input(s): CKTOTAL, CKMB, CKMBINDEX, TROPONINI in the last 168 hours. BNP (last 3 results) No results for input(s): PROBNP in the last 8760  hours. HbA1C: No results for input(s): HGBA1C in the last 72 hours. CBG: Recent Labs  Lab 03/24/20 0746 03/24/20 0921 03/24/20 1147 03/24/20 1625 03/24/20 2101  GLUCAP 542* 467* 274* 228* 268*   Lipid Profile: No results for input(s): CHOL, HDL,  LDLCALC, TRIG, CHOLHDL, LDLDIRECT in the last 72 hours. Thyroid Function Tests: No results for input(s): TSH, T4TOTAL, FREET4, T3FREE, THYROIDAB in the last 72 hours. Anemia Panel: No results for input(s): VITAMINB12, FOLATE, FERRITIN, TIBC, IRON, RETICCTPCT in the last 72 hours. Sepsis Labs: No results for input(s): PROCALCITON, LATICACIDVEN in the last 168 hours.  No results found for this or any previous visit (from the past 240 hour(s)).       Radiology Studies: No results found.      Scheduled Meds: . amLODipine  10 mg Oral Daily  . aspirin EC  81 mg Oral Daily  . enoxaparin (LOVENOX) injection  40 mg Subcutaneous Q24H  . famotidine  20 mg Oral Daily  . insulin aspart  0-15 Units Subcutaneous TID WC  . insulin aspart  0-5 Units Subcutaneous QHS  . insulin aspart  12 Units Subcutaneous TID WC  . insulin glargine  30 Units Subcutaneous Daily  . losartan  50 mg Oral Daily  . methocarbamol  500 mg Oral TID  . multivitamin with minerals  1 tablet Oral Daily  . polyethylene glycol  17 g Oral Daily  . Ensure Max Protein  11 oz Oral BID BM   Continuous Infusions: . sodium chloride 10 mL/hr at 03/24/20 1851  . small volume/piggyback builder       LOS: 12 days    Time spent: 30 mins     Charise Killian, MD [facility] Pager [phone]  If 7PM-7AM, please contact night-coverage [email or web address] 03/25/2020, 7:28 AM

## 2020-03-26 LAB — CBC
HCT: 26.9 % — ABNORMAL LOW (ref 39.0–52.0)
Hemoglobin: 8.9 g/dL — ABNORMAL LOW (ref 13.0–17.0)
MCH: 30.2 pg (ref 26.0–34.0)
MCHC: 33.1 g/dL (ref 30.0–36.0)
MCV: 91.2 fL (ref 80.0–100.0)
Platelets: 811 10*3/uL — ABNORMAL HIGH (ref 150–400)
RBC: 2.95 MIL/uL — ABNORMAL LOW (ref 4.22–5.81)
RDW: 14.6 % (ref 11.5–15.5)
WBC: 10.9 10*3/uL — ABNORMAL HIGH (ref 4.0–10.5)
nRBC: 0 % (ref 0.0–0.2)

## 2020-03-26 LAB — GLUCOSE, CAPILLARY
Glucose-Capillary: 231 mg/dL — ABNORMAL HIGH (ref 70–99)
Glucose-Capillary: 112 mg/dL — ABNORMAL HIGH (ref 70–99)
Glucose-Capillary: 285 mg/dL — ABNORMAL HIGH (ref 70–99)
Glucose-Capillary: 145 mg/dL — ABNORMAL HIGH (ref 70–99)

## 2020-03-26 LAB — BASIC METABOLIC PANEL
Anion gap: 8 (ref 5–15)
BUN: 27 mg/dL — ABNORMAL HIGH (ref 8–23)
CO2: 24 mmol/L (ref 22–32)
Calcium: 8.1 mg/dL — ABNORMAL LOW (ref 8.9–10.3)
Chloride: 105 mmol/L (ref 98–111)
Creatinine, Ser: 0.55 mg/dL — ABNORMAL LOW (ref 0.61–1.24)
GFR calc Af Amer: >60 mL/min (ref >60)
GFR calc non Af Amer: >60 mL/min (ref >60)
Glucose, Bld: 274 mg/dL — ABNORMAL HIGH (ref 70–99)
Potassium: 4.1 mmol/L (ref 3.5–5.1)
Sodium: 137 mmol/L (ref 135–145)

## 2020-03-26 MED ORDER — ALPRAZOLAM 0.5 MG PO TABS
0.5000 mg | ORAL_TABLET | Freq: Every evening | ORAL | Status: DC | PRN
  Administered 2020-03-28 – 2020-03-29 (×2): 0.5 mg via ORAL
  Filled 2020-03-26 (×2): qty 1

## 2020-03-26 MED ORDER — TRAMADOL HCL 50 MG PO TABS
50.0000 mg | ORAL_TABLET | Freq: Four times a day (QID) | ORAL | Status: DC | PRN
  Administered 2020-03-26: 50 mg via ORAL
  Filled 2020-03-26: qty 1

## 2020-03-26 MED ORDER — HYDROCODONE-ACETAMINOPHEN 5-325 MG PO TABS
1.0000 | ORAL_TABLET | Freq: Once | ORAL | Status: AC
  Administered 2020-03-26: 1 via ORAL
  Filled 2020-03-26: qty 1

## 2020-03-26 MED ORDER — TRAMADOL HCL 50 MG PO TABS
50.0000 mg | ORAL_TABLET | Freq: Four times a day (QID) | ORAL | Status: DC | PRN
  Administered 2020-03-26 – 2020-04-27 (×50): 50 mg via ORAL
  Filled 2020-03-26 (×50): qty 1

## 2020-03-26 NOTE — Progress Notes (Signed)
PROGRESS NOTE    Spencer [name]  [id number]:[id number] DOB: 06-11-1961 DOA: 03/13/2020 PCP: Patient, No Pcp Per      Assessment & Plan:   Principal Problem:   MSSA bacteremia Active Problems:   DKA (diabetic ketoacidoses) ([facility])   Hypertension   Diabetic neuropathy ([facility])   DM2 (diabetes mellitus, type 2) ([facility])   History of cocaine abuse ([facility])   Personal history of subdural hematoma   AKI (acute kidney injury) ([facility])   GERD (gastroesophageal reflux disease)   C4 cervical fracture ([facility])   Cigarette smoker   Pressure injury of skin   Lack of medical decision-making capacity: evaluated by psychiatry on 5/15 patient has impaired medical decision-making capacity, currently agreeable to continuing inpatient care. No IVC in place, cannot hold pt against his will as per CM. Pt's sister is working on getting legal guardianship over this pt  DM2: poorly controlled, A1c 12.6. Presented in DKA in setting of poor insulin adherence. Continue on lantus, novolog, & SSI w/ accuchecks   MSSA bacteremia: recurrent in the setting of leaving AMA on previous hospital stay, admitting blood cultures on 5/8+ for MSSA.  TTE unremarkable, patient initially declined TEE on 5/13 discussed with cardiology, also some concern of moderate sedation, also high risk given cervical fracture. Patient does not have decision-making capacity. Repeat blood culture on 03/15/20 NGTD x5 days. Continue nafcillin for 6 total weeks until 04/26/20 as per ID recommendations  Intermittent episodes of agitation: psych recommends low dose Haldol 2 mg in the future  Minimally displaced C4 fracture: recommendation to wear Aspen collar at all times however pt has been declining it. No symptoms of cervical myelopathy. Neurosurgery previously evaluated, patient okay to undergo TEE as long as the Aspen collar remains in place during the entirety of the procedure. Outpatient follow-up with neurosurgery clinic Patsey Berthold, NP)  AKI:  prerenal  etiology related to dehydration related to DKA on admission. Resolved  Cocaine abuse: UDS positive for cocaine on admission. Illicit drug abuse cessation counseling   Hypertension: continue home amlodipine, losartan   Thrombocytosis: etiology unclear, possibly reactive. Will continue to monitor  Leukocytosis: likely secondary to MSSA bacteremia. Continue on IV abxs    DVT prophylaxis: lovenox  Code Status: full Family Communication:  Disposition Plan: will need 6 weeks of IV abxs as per ID. PT recs home health but pt cannot do IV abxs at home w/ hx of drug abuse so will have to stay until IV abxs are complete.    Status is: Inpatient  Remains inpatient appropriate because:IV treatments appropriate due to intensity of illness or inability to take PO , unsafe d/c plan   Dispo: The patient is from: Home              Anticipated d/c is to: SNF vs home health vs AMA as pt has left AMA                     before              Anticipated d/c date is: > 3 days              Patient currently is not medically stable to d/c.    Consultants:  ID Psych Cardio  Procedures:    Antimicrobials: nafcillin    Subjective: Pt c/o pain everywhere.  Objective: Vitals:   03/24/20 2334 03/25/20 0753 03/25/20 1545 03/25/20 2313  BP: (!) 152/85 (!) 151/93 (!) 141/93 (!) 143/84  Pulse: 86 88  87 88  Resp: 18 16 18 18   Temp: 98.2 F (36.8 C) 98.1 F (36.7 C) 98.4 F (36.9 C) 99 F (37.2 C)  TempSrc: Oral Oral  Oral  SpO2: 99% 97% 97% 98%  Weight:      Height:        Intake/Output Summary (Last 24 hours) at 03/26/2020 0738 Last data filed at 03/26/2020 0400 Gross per 24 hour  Intake 360.22 ml  Output 1875 ml  Net -1514.78 ml   Filed Weights   03/13/20 1654 03/13/20 2055  Weight: 59 kg 60.3 kg    Examination:  General exam: Appears calm and comfortable  Respiratory system: decreased breath sounds b/l otherwise clear.  Cardiovascular system: S1 & S2 +. No rubs, gallops or  clicks.  Gastrointestinal system: Abdomen is nondistended, soft and nontender. Noraml bowel sounds heard. Central nervous system: Alert and oriented. Moves all 4 extremities  Psychiatry: Judgement and insight appear abnormal. Agitated and frustrated    Data Reviewed: I have personally reviewed following labs and imaging studies  CBC: Recent Labs  Lab 03/22/20 0439 03/23/20 0834 03/24/20 0838 03/25/20 0658 03/26/20 0424  WBC 12.6* 12.2* 12.5* 11.6* 10.9*  HGB 9.2* 9.1* 8.5* 8.2* 8.9*  HCT 27.2* 28.0* 26.7* 25.5* 26.9*  MCV 88.9 92.4 93.7 92.7 91.2  PLT 648* 779* 762* 737* 762*   Basic Metabolic Panel: Recent Labs  Lab 03/20/20 0432 03/21/20 0656 03/22/20 0439 03/23/20 0834 03/24/20 0838 03/25/20 0658 03/26/20 0424  NA 134*   < > 134* 134* 131* 137 137  K 3.8   < > 4.3 4.1 4.2 3.7 4.1  CL 103   < > 102 102 103 107 105  CO2 26   < > 24 25 20* 23 24  GLUCOSE 229*   < > 331* 273* 599* 196* 274*  BUN 25*   < > 23 24* 31* 24* 27*  CREATININE 0.62   < > 0.61 0.61 0.80 0.50* 0.55*  CALCIUM 7.8*   < > 7.9* 7.9* 7.6* 8.1* 8.1*  MG 2.2  --   --   --   --   --   --   PHOS 2.6  --   --   --   --   --   --    < > = values in this interval not displayed.   GFR: Estimated Creatinine Clearance: 82.7 mL/min (A) (by C-G formula based on SCr of 0.55 mg/dL (L)). Liver Function Tests: No results for input(s): AST, ALT, ALKPHOS, BILITOT, PROT, ALBUMIN in the last 168 hours. No results for input(s): LIPASE, AMYLASE in the last 168 hours. No results for input(s): AMMONIA in the last 168 hours. Coagulation Profile: No results for input(s): INR, PROTIME in the last 168 hours. Cardiac Enzymes: No results for input(s): CKTOTAL, CKMB, CKMBINDEX, TROPONINI in the last 168 hours. BNP (last 3 results) No results for input(s): PROBNP in the last 8760 hours. HbA1C: No results for input(s): HGBA1C in the last 72 hours. CBG: Recent Labs  Lab 03/24/20 2101 03/25/20 0754 03/25/20 1201  03/25/20 1652 03/25/20 2103  GLUCAP 268* 171* 182* 114* 232*   Lipid Profile: No results for input(s): CHOL, HDL, LDLCALC, TRIG, CHOLHDL, LDLDIRECT in the last 72 hours. Thyroid Function Tests: No results for input(s): TSH, T4TOTAL, FREET4, T3FREE, THYROIDAB in the last 72 hours. Anemia Panel: No results for input(s): VITAMINB12, FOLATE, FERRITIN, TIBC, IRON, RETICCTPCT in the last 72 hours. Sepsis Labs: No results for input(s): PROCALCITON, LATICACIDVEN in the last  168 hours.  No results found for this or any previous visit (from the past 240 hour(s)).       Radiology Studies: No results found.      Scheduled Meds: . amLODipine  10 mg Oral Daily  . aspirin EC  81 mg Oral Daily  . enoxaparin (LOVENOX) injection  40 mg Subcutaneous Q24H  . famotidine  20 mg Oral Daily  . insulin aspart  0-15 Units Subcutaneous TID WC  . insulin aspart  0-5 Units Subcutaneous QHS  . insulin aspart  12 Units Subcutaneous TID WC  . insulin glargine  30 Units Subcutaneous Daily  . losartan  50 mg Oral Daily  . methocarbamol  500 mg Oral TID  . multivitamin with minerals  1 tablet Oral Daily  . polyethylene glycol  17 g Oral Daily  . Ensure Max Protein  11 oz Oral BID BM   Continuous Infusions: . sodium chloride 10 mL/hr at 03/24/20 1851  . nafcillin (NAFCIL) continuous infusion       LOS: 13 days    Time spent: 32 mins     Charise Killian, MD [facility] Pager [phone]  If 7PM-7AM, please contact night-coverage [email or web address] 03/26/2020, 7:38 AM

## 2020-03-26 NOTE — Progress Notes (Signed)
PT Cancellation Note  Patient Details Name: Spencer [name] MRN: [id number] DOB: [birth year]   Cancelled Treatment:      PT attempt. Pt refused. " Can't you see I'm trying to sleep!" requested therapist return at later time/date. PT will continue to follow pt per POC.   Rushie Chestnut 03/26/2020, 11:13 AM

## 2020-03-26 NOTE — Progress Notes (Signed)
Patient has c/o 10/10 of bilateral shoulders too early to give more pain medication. I have contacted the DR at this point. Patient also requesting something for sleep.

## 2020-03-26 NOTE — Progress Notes (Signed)
Date of Admission:  03/13/2020      Subjective: Lying in bed with eyes closed Says he has pain left shoulder, neck  Medications:  . amLODipine  10 mg Oral Daily  . aspirin EC  81 mg Oral Daily  . enoxaparin (LOVENOX) injection  40 mg Subcutaneous Q24H  . famotidine  20 mg Oral Daily  . insulin aspart  0-15 Units Subcutaneous TID WC  . insulin aspart  0-5 Units Subcutaneous QHS  . insulin aspart  12 Units Subcutaneous TID WC  . insulin glargine  30 Units Subcutaneous Daily  . losartan  50 mg Oral Daily  . methocarbamol  500 mg Oral TID  . multivitamin with minerals  1 tablet Oral Daily  . polyethylene glycol  17 g Oral Daily  . Ensure Max Protein  11 oz Oral BID BM    Objective: Vital signs in last 24 hours: Temp:  [97.8 F (36.6 C)-99 F (37.2 C)] 97.8 F (36.6 C) (05/21 1654) Pulse Rate:  [88-90] 90 (05/21 1654) Resp:  [16-18] 16 (05/21 1654) BP: (143-170)/(84-101) 158/95 (05/21 1654) SpO2:  [97 %-99 %] 99 % (05/21 1654)  PHYSICAL EXAM:  General: lying in bed with eyes closed, more calm but gets irritable easily , cooperative, no distress, Lungs: Clear to auscultation bilaterally. No Wheezing or Rhonchi. No rales. Heart: Regular rate and rhythm, no murmur, rub or gallop. Abdomen: Soft, non-tender,not distended. Bowel sounds normal. No masses Extremities: left shoulder movt restricted- lift the left arm by holding it with the right hand Skin: No rashes or lesions. Or bruising Lymph: Cervical, supraclavicular normal. Neurologic: Grossly non-focal  Lab Results  CBC Latest Ref Rng & Units 03/26/2020 03/25/2020 03/24/2020  WBC 4.0 - 10.5 K/uL 10.9(H) 11.6(H) 12.5(H)  Hemoglobin 13.0 - 17.0 g/dL 8.9(L) 8.2(L) 8.5(L)  Hematocrit 39.0 - 52.0 % 26.9(L) 25.5(L) 26.7(L)  Platelets 150 - 400 K/uL 811(H) 737(H) 762(H)    CMP Latest Ref Rng & Units 03/26/2020 03/25/2020 03/24/2020  Glucose 70 - 99 mg/dL 274(H) 196(H) 599(HH)  BUN 8 - 23 mg/dL 27(H) 24(H) 31(H)  Creatinine 0.61  - 1.24 mg/dL 0.55(L) 0.50(L) 0.80  Sodium 135 - 145 mmol/L 137 137 131(L)  Potassium 3.5 - 5.1 mmol/L 4.1 3.7 4.2  Chloride 98 - 111 mmol/L 105 107 103  CO2 22 - 32 mmol/L 24 23 20(L)  Calcium 8.9 - 10.3 mg/dL 8.1(L) 8.1(L) 7.6(L)  Total Protein 6.5 - 8.1 g/dL - - -  Total Bilirubin 0.3 - 1.2 mg/dL - - -  Alkaline Phos 38 - 126 U/L - - -  AST 15 - 41 U/L - - -  ALT 0 - 44 U/L - - -   Microbiology: 03/04/2020 blood culture staph aureus  03/06/2020 blood culture negative  Blood culture from 5/8 staph aureus  5/10 blood culture Negative   Impression/Recommendation  Staph aureus bacteremiain a patient who is using cocaine. He denies IV drug use Concern for endocarditis and also concern for vertebral infection. He has fracture of the C4 vertebrae and the MRI done  showed prevertebral effusion. There was no involvement of the bone or the disc but the film was limited by movement. We will have to treat this like a deep-seated infection with 6 weeks of IV antibiotics.04/26/20 Currently on nafcillin  Thrombocytosis- likely due o infection- raises the concern for an abscess or collection Recommend repeating MRI cervical spine and left shoulder next week to look for collection/septic arthritis   Patient had left AMA twice  before. Very likely will have to be placed in a nursing facility or rehab to get his IV antibiotics. Cardiology has deferred TEE because of patient's overall situation.  C4 vertebral fracture. Needs Aspencollar to be worn all the time.  Diabetes mellitus noncompliant with medication and hence gets admitted with DKA.  Cocaine abuse  ID will follow him peripherally this weekend

## 2020-03-27 LAB — GLUCOSE, CAPILLARY
Glucose-Capillary: 236 mg/dL — ABNORMAL HIGH (ref 70–99)
Glucose-Capillary: 240 mg/dL — ABNORMAL HIGH (ref 70–99)
Glucose-Capillary: 123 mg/dL — ABNORMAL HIGH (ref 70–99)
Glucose-Capillary: 260 mg/dL — ABNORMAL HIGH (ref 70–99)

## 2020-03-27 LAB — BASIC METABOLIC PANEL
Anion gap: 7 (ref 5–15)
BUN: 26 mg/dL — ABNORMAL HIGH (ref 8–23)
CO2: 24 mmol/L (ref 22–32)
Calcium: 8.2 mg/dL — ABNORMAL LOW (ref 8.9–10.3)
Chloride: 106 mmol/L (ref 98–111)
Creatinine, Ser: 0.52 mg/dL — ABNORMAL LOW (ref 0.61–1.24)
GFR calc Af Amer: >60 mL/min (ref >60)
GFR calc non Af Amer: >60 mL/min (ref >60)
Glucose, Bld: 106 mg/dL — ABNORMAL HIGH (ref 70–99)
Potassium: 3.8 mmol/L (ref 3.5–5.1)
Sodium: 137 mmol/L (ref 135–145)

## 2020-03-27 LAB — CBC
HCT: 27.3 % — ABNORMAL LOW (ref 39.0–52.0)
Hemoglobin: 8.9 g/dL — ABNORMAL LOW (ref 13.0–17.0)
MCH: 30.1 pg (ref 26.0–34.0)
MCHC: 32.6 g/dL (ref 30.0–36.0)
MCV: 92.2 fL (ref 80.0–100.0)
Platelets: 804 10*3/uL — ABNORMAL HIGH (ref 150–400)
RBC: 2.96 MIL/uL — ABNORMAL LOW (ref 4.22–5.81)
RDW: 14.7 % (ref 11.5–15.5)
WBC: 12.1 10*3/uL — ABNORMAL HIGH (ref 4.0–10.5)
nRBC: 0 % (ref 0.0–0.2)

## 2020-03-27 LAB — SEDIMENTATION RATE: Sed Rate: 127 mm/hr — ABNORMAL HIGH (ref 0–16)

## 2020-03-27 MED ORDER — SODIUM CHLORIDE 0.9% FLUSH: 10.0000 mL | INTRAVENOUS | Status: DC | PRN

## 2020-03-27 NOTE — Progress Notes (Signed)
Occupational Therapy Treatment Patient Details Name: Spencer [name] MRN: [id number] DOB: 04/17/1961 Today's Date: 03/27/2020    History of present illness Pt admitted for MSSA bactermia and elevated BG. Pt with recent admission for similar issues, left AMA. HIstory includes DM, cocaine abuse, brain aneurysm, HTN, and GERD. Last admission with + C4 fx with Aspen collar in place.   OT comments  Pt seen for OT tx this date to f/u re: safety with self care ADLs/ADL mobility. Session somewhat limited d/t pt's disinterest in safe performance of mobilization. Requires increased encouragement/education re: importance of OOB mobility to motivate initially, pt becomes motivated to mobilize only d/t need to have a BM, and then extended time educating re: importance of cervical collar for spinal cord protection. In addition, pt is impulsive t/o CGA SPS with HHA to/from [facility] (OT attempts to encourage pt to perform fxl mobility to restroom to increase fxl activity tolerance, but to no avail as pt demands to just bring [facility] to him). Ultimately, pt with some self-limiting behavior demo'ed and decreased safety awareness increasing likelihood of falls with OOB Activity and impacting progress through therapy. Pt will require 24/7 supv on d/c regardless of setting to ensure safety, would benefit from OT f/u upon d/c if willing/participatory.   Follow Up Recommendations  Home health OT;Supervision/Assistance - 24 hour    Equipment Recommendations       Recommendations for Other Services      Precautions / Restrictions Precautions Precautions: Fall Required Braces or Orthoses: Cervical Brace Cervical Brace: Hard collar(aspen) Restrictions Weight Bearing Restrictions: No       Mobility Bed Mobility Overal bed mobility: Needs Assistance Bed Mobility: Sidelying to Sit;Sit to Supine   Sidelying to sit: Min guard   Sit to supine: Supervision   General bed mobility comments: verbal cues for safety/awareness of  precaustions/log roll throughout. Pt impulsive/poor safety awareness.  Transfers Overall transfer level: Needs assistance   Transfers: Stand Pivot Transfers   Stand pivot transfers: Min guard       General transfer comment: CGA to/from [facility] d/t impulsivity    Balance Overall balance assessment: History of Falls;Needs assistance Sitting-balance support: Feet supported Sitting balance-Leahy Scale: Good     Standing balance support: Single extremity supported Standing balance-Leahy Scale: Fair Standing balance comment: demos some instability in standing, but no gross LOB. REquires at least 1 UE support to sustain balance with SPS transfer.                           ADL either performed or assessed with clinical judgement   ADL Overall ADL's : Needs assistance/impaired     Grooming: Wash/dry hands;Set up;Sitting                   Toilet Transfer: Min guard;Stand-pivot;BSC   Toileting- Clothing Manipulation and Hygiene: Set up;Sitting/lateral lean               Vision Patient Visual Report: No change from baseline     Perception     Praxis      Cognition Arousal/Alertness: Awake/alert Behavior During Therapy: WFL for tasks assessed/performed;Impulsive Overall Cognitive Status: History of cognitive impairments - at baseline                                 General Comments: Pt with poor safety awareness, tries to decline wearing cervical collar during session. Needs  encouragement to participate with therapy on any level. Only motivated because he needs to use commode.        Exercises Other Exercises Other Exercises: OT attempts to facilitate education re: importance of OOB activity r/t getting better/getting home as pt repeatedly reports he's "ready to go home". Pt with poor reception of education d/t disinterest. Only motivated to mobilize d/t needing to use commode.   Shoulder Instructions       General Comments       Pertinent Vitals/ Pain       Pain Assessment: Faces Faces Pain Scale: Hurts a little bit Pain Location: anterior cervical/neck region Pain Descriptors / Indicators: Discomfort Pain Intervention(s): Limited activity within patient's tolerance;Monitored during session  Home Living                                          Prior Functioning/Environment              Frequency  Min 1X/week        Progress Toward Goals  OT Goals(current goals can now be found in the care plan section)  Progress towards OT goals: Not progressing toward goals - comment(pt with limited interest in performing mobilization in the safest way, presenting risk for falls d/t inherent unsteadiness at baseline and need for cervical collar.)  Acute Rehab OT Goals Patient Stated Goal: To go home OT Goal Formulation: With patient Time For Goal Achievement: 04/02/20 Potential to Achieve Goals: Fair  Plan Discharge plan remains appropriate    Co-evaluation                 AM-PAC OT "6 Clicks" Daily Activity     Outcome Measure   Help from another person eating meals?: A Little Help from another person taking care of personal grooming?: A Little Help from another person toileting, which includes using toliet, bedpan, or urinal?: A Little Help from another person bathing (including washing, rinsing, drying)?: A Lot Help from another person to put on and taking off regular upper body clothing?: A Little Help from another person to put on and taking off regular lower body clothing?: A Little 6 Click Score: 17    End of Session Equipment Utilized During Treatment: Cervical collar;Gait belt  OT Visit Diagnosis: Unsteadiness on feet (R26.81);Other abnormalities of gait and mobility (R26.89)   Activity Tolerance Other (comment)(pt self limiting)   Patient Left in bed;with call bell/phone within reach;with bed alarm set   Nurse Communication Mobility status;Other (comment)(need  bed change)        Time: 1202-1225 OT Time Calculation (min): 23 min  Charges: OT General Charges $OT Visit: 1 Visit OT Treatments $Self Care/Home Management : 8-22 mins $Therapeutic Activity: 8-22 mins  Rejeana Brock, MS, OTR/L ascom [phone] 03/27/20, 2:32 PM

## 2020-03-27 NOTE — Progress Notes (Signed)
PROGRESS NOTE    Spencer [name]  YJE:[id number] DOB: 05-19-1961 DOA: 03/13/2020 PCP: Patient, No Pcp Per      Assessment & Plan:   Principal Problem:   MSSA bacteremia Active Problems:   DKA (diabetic ketoacidoses) ([place])   Hypertension   Diabetic neuropathy ([place])   DM2 (diabetes mellitus, type 2) ([place])   History of cocaine abuse ([place])   Personal history of subdural hematoma   AKI (acute kidney injury) ([place])   GERD (gastroesophageal reflux disease)   C4 cervical fracture ([facility])   Cigarette smoker   Pressure injury of skin   Lack of medical decision-making capacity: evaluated by psychiatry on 5/15 patient has impaired medical decision-making capacity, currently agreeable to continuing inpatient care. No IVC in place, cannot hold pt against his will as per CM. Pt's sister is working on getting legal guardianship over this pt  DM2: poorly controlled, A1c 12.6. Presented in DKA in setting of poor insulin adherence. Continue on lantus, novolog, & SSI w/ accuchecks   MSSA bacteremia: recurrent in the setting of leaving AMA on previous hospital stay, admitting blood cultures on 5/8+ for MSSA.  TTE unremarkable, patient initially declined TEE on 5/13 discussed with cardiology, also some concern of moderate sedation, also high risk given cervical fracture. Patient does not have decision-making capacity. Repeat blood culture on 03/15/20 NGTD x5 days. Continue nafcillin for 6 total weeks until 04/26/20 as per ID recommendations. MRI C-spine & left shoulder next week to look for septic arthritis as per ID  Intermittent episodes of agitation: psych recommends low dose Haldol 2 mg in the future  Minimally displaced C4 fracture: recommendation to wear Aspen collar at all times however pt has been declining it. No symptoms of cervical myelopathy. Neurosurgery previously evaluated, patient okay to undergo TEE as long as the Aspen collar remains in place during the entirety of the procedure. Outpatient  follow-up with neurosurgery clinic Lonell Face, NP)  AKI: prerenal etiology related to dehydration related to DKA on admission. Resolved  Cocaine abuse: UDS positive for cocaine on admission. Illicit drug abuse cessation counseling   Hypertension: continue home amlodipine, losartan   Thrombocytosis: etiology unclear, possibly reactive. Will continue to monitor  Leukocytosis: likely secondary to MSSA bacteremia. Continue on IV abxs    DVT prophylaxis: lovenox  Code Status: full Family Communication:  Disposition Plan: will need 6 weeks of IV abxs as per ID. PT recs home health but pt cannot do IV abxs at home w/ hx of drug abuse so will have to stay until IV abxs are complete.    Status is: Inpatient  Remains inpatient appropriate because:IV treatments appropriate due to intensity of illness or inability to take PO , unsafe d/c plan   Dispo: The patient is from: Home              Anticipated d/c is to: SNF vs home health vs AMA as pt has left AMA                     before              Anticipated d/c date is: > 3 days              Patient currently is not medically stable to d/c.    Consultants:  ID Psych Cardio  Procedures:    Antimicrobials: nafcillin    Subjective: Pt c/o pain everywhere again today.  Objective: Vitals:   03/25/20 2313 03/26/20 0739 03/26/20 1654  03/26/20 2321  BP: (!) 143/84 (!) 170/101 (!) 158/95 (!) 156/89  Pulse: 88 88 90 86  Resp: 18 17 16 16   Temp: 99 F (37.2 C) 98.2 F (36.8 C) 97.8 F (36.6 C) 98.1 F (36.7 C)  TempSrc: Oral Oral Oral Oral  SpO2: 98% 97% 99% 96%  Weight:      Height:        Intake/Output Summary (Last 24 hours) at 03/27/2020 0738 Last data filed at 03/27/2020 0146 Gross per 24 hour  Intake 0 ml  Output 1075 ml  Net -1075 ml   Filed Weights   03/13/20 1654 03/13/20 2055  Weight: 59 kg 60.3 kg    Examination:  General exam: Appears calm and comfortable  Respiratory system: diminished breath  sounds b/l otherwise clear. No rales Cardiovascular system: S1 & S2 +. No rubs, gallops or clicks.  Gastrointestinal system: Abdomen is nondistended, soft and nontender. Hypoactive bowel sounds heard. Central nervous system: Alert and oriented. Moves all 4 extremities  Psychiatry: Judgement and insight appear abnormal. Flat mood and affect    Data Reviewed: I have personally reviewed following labs and imaging studies  CBC: Recent Labs  Lab 03/23/20 0834 03/24/20 0838 03/25/20 0658 03/26/20 0424 03/27/20 0441  WBC 12.2* 12.5* 11.6* 10.9* 12.1*  HGB 9.1* 8.5* 8.2* 8.9* 8.9*  HCT 28.0* 26.7* 25.5* 26.9* 27.3*  MCV 92.4 93.7 92.7 91.2 92.2  PLT 779* 762* 737* 811* 804*   Basic Metabolic Panel: Recent Labs  Lab 03/23/20 0834 03/24/20 0838 03/25/20 0658 03/26/20 0424 03/27/20 0441  NA 134* 131* 137 137 137  K 4.1 4.2 3.7 4.1 3.8  CL 102 103 107 105 106  CO2 25 20* 23 24 24   GLUCOSE 273* 599* 196* 274* 106*  BUN 24* 31* 24* 27* 26*  CREATININE 0.61 0.80 0.50* 0.55* 0.52*  CALCIUM 7.9* 7.6* 8.1* 8.1* 8.2*   GFR: Estimated Creatinine Clearance: 82.7 mL/min (A) (by C-G formula based on SCr of 0.52 mg/dL (L)). Liver Function Tests: No results for input(s): AST, ALT, ALKPHOS, BILITOT, PROT, ALBUMIN in the last 168 hours. No results for input(s): LIPASE, AMYLASE in the last 168 hours. No results for input(s): AMMONIA in the last 168 hours. Coagulation Profile: No results for input(s): INR, PROTIME in the last 168 hours. Cardiac Enzymes: No results for input(s): CKTOTAL, CKMB, CKMBINDEX, TROPONINI in the last 168 hours. BNP (last 3 results) No results for input(s): PROBNP in the last 8760 hours. HbA1C: No results for input(s): HGBA1C in the last 72 hours. CBG: Recent Labs  Lab 03/25/20 2103 03/26/20 0742 03/26/20 1218 03/26/20 1656 03/26/20 2106  GLUCAP 232* 231* 112* 285* 145*   Lipid Profile: No results for input(s): CHOL, HDL, LDLCALC, TRIG, CHOLHDL, LDLDIRECT  in the last 72 hours. Thyroid Function Tests: No results for input(s): TSH, T4TOTAL, FREET4, T3FREE, THYROIDAB in the last 72 hours. Anemia Panel: No results for input(s): VITAMINB12, FOLATE, FERRITIN, TIBC, IRON, RETICCTPCT in the last 72 hours. Sepsis Labs: No results for input(s): PROCALCITON, LATICACIDVEN in the last 168 hours.  No results found for this or any previous visit (from the past 240 hour(s)).       Radiology Studies: No results found.      Scheduled Meds: . amLODipine  10 mg Oral Daily  . aspirin EC  81 mg Oral Daily  . enoxaparin (LOVENOX) injection  40 mg Subcutaneous Q24H  . famotidine  20 mg Oral Daily  . insulin aspart  0-15 Units Subcutaneous TID WC  .  insulin aspart  0-5 Units Subcutaneous QHS  . insulin aspart  12 Units Subcutaneous TID WC  . insulin glargine  30 Units Subcutaneous Daily  . losartan  50 mg Oral Daily  . methocarbamol  500 mg Oral TID  . multivitamin with minerals  1 tablet Oral Daily  . polyethylene glycol  17 g Oral Daily  . Ensure Max Protein  11 oz Oral BID BM   Continuous Infusions: . sodium chloride 10 mL/hr at 03/24/20 1851  . nafcillin (NAFCIL) continuous infusion Stopped (03/26/20 0852)     LOS: 14 days    Time spent: 32 mins     Charise Killian, MD [facility] Pager [phone]  If 7PM-7AM, please contact night-coverage [email or web address] 03/27/2020, 7:38 AM

## 2020-03-28 LAB — BASIC METABOLIC PANEL
Anion gap: 4 — ABNORMAL LOW (ref 5–15)
BUN: 26 mg/dL — ABNORMAL HIGH (ref 8–23)
CO2: 24 mmol/L (ref 22–32)
Calcium: 8 mg/dL — ABNORMAL LOW (ref 8.9–10.3)
Chloride: 107 mmol/L (ref 98–111)
Creatinine, Ser: 0.62 mg/dL (ref 0.61–1.24)
GFR calc Af Amer: >60 mL/min (ref >60)
GFR calc non Af Amer: >60 mL/min (ref >60)
Glucose, Bld: 288 mg/dL — ABNORMAL HIGH (ref 70–99)
Potassium: 4.3 mmol/L (ref 3.5–5.1)
Sodium: 135 mmol/L (ref 135–145)

## 2020-03-28 LAB — CBC
HCT: 27.2 % — ABNORMAL LOW (ref 39.0–52.0)
Hemoglobin: 8.5 g/dL — ABNORMAL LOW (ref 13.0–17.0)
MCH: 29.1 pg (ref 26.0–34.0)
MCHC: 31.3 g/dL (ref 30.0–36.0)
MCV: 93.2 fL (ref 80.0–100.0)
Platelets: 687 10*3/uL — ABNORMAL HIGH (ref 150–400)
RBC: 2.92 MIL/uL — ABNORMAL LOW (ref 4.22–5.81)
RDW: 15.1 % (ref 11.5–15.5)
WBC: 12.1 10*3/uL — ABNORMAL HIGH (ref 4.0–10.5)
nRBC: 0 % (ref 0.0–0.2)

## 2020-03-28 LAB — GLUCOSE, CAPILLARY
Glucose-Capillary: 243 mg/dL — ABNORMAL HIGH (ref 70–99)
Glucose-Capillary: 132 mg/dL — ABNORMAL HIGH (ref 70–99)
Glucose-Capillary: 229 mg/dL — ABNORMAL HIGH (ref 70–99)

## 2020-03-28 LAB — TROPONIN I (HIGH SENSITIVITY)
Troponin I (High Sensitivity): 19 ng/L — ABNORMAL HIGH (ref <18)
Troponin I (High Sensitivity): 18 ng/L — ABNORMAL HIGH (ref <18)

## 2020-03-28 MED ORDER — LORAZEPAM 2 MG/ML IJ SOLN
1.0000 mg | INTRAMUSCULAR | Status: AC
  Administered 2020-03-28: 1 mg via INTRAVENOUS
  Filled 2020-03-28: qty 1

## 2020-03-28 MED ORDER — LORAZEPAM 2 MG/ML IJ SOLN
2.0000 mg | Freq: Once | INTRAMUSCULAR | Status: AC
  Administered 2020-03-28: 2 mg via INTRAVENOUS
  Filled 2020-03-28: qty 1

## 2020-03-28 NOTE — Plan of Care (Signed)
  Problem: Metabolic: Goal: Ability to maintain appropriate glucose levels will improve Outcome: Progressing   Problem: Nutritional: Goal: Maintenance of adequate nutrition will improve Outcome: Progressing   Problem: Skin Integrity: Goal: Risk for impaired skin integrity will decrease Outcome: Progressing   

## 2020-03-28 NOTE — Progress Notes (Signed)
Patient is very agitated and tore IV tubing in half. He will not let me replace the IV tubing and hook his antibiotics back up. States he wants to leave AMA. MD aware, patient educated on AMA, states he doesn't care and wants some clothes to put on and that he would be back later. Disposable scrubs requested from materials.

## 2020-03-28 NOTE — Progress Notes (Signed)
Patient complains of chest pain 10/10. Patient holding chest and states the pain is a deep dull pain. Spencer Schwartz NP at bedside, EKG ordered and done. VS taken and labs ordered. Will continue to monitor.

## 2020-03-28 NOTE — Progress Notes (Signed)
PROGRESS NOTE    [name]  KDX:[id number] DOB: [birth year] DOA: 03/13/2020 PCP: Spencer [name], No Pcp Per      Assessment & Plan:   Principal Problem:   MSSA bacteremia Active Problems:   DKA (diabetic ketoacidoses) ([facility])   Hypertension   Diabetic neuropathy ([facility])   DM2 (diabetes mellitus, type 2) ([facility])   History of cocaine abuse ([facility])   Personal history of subdural hematoma   AKI (acute kidney injury) ([facility])   GERD (gastroesophageal reflux disease)   C4 cervical fracture ([facility])   Cigarette smoker   Pressure injury of skin   Lack of medical decision-making capacity: evaluated by psychiatry on 5/15 Spencer [name] has impaired medical decision-making capacity, currently agreeable to continuing inpatient care. No IVC in place, cannot hold pt against his will as per CM. Pt's sister is working on getting legal guardianship over this pt  DM2: poorly controlled, A1c 12.6. Presented in DKA in setting of poor insulin adherence. Continue on lantus, novolog, & SSI w/ accuchecks   MSSA bacteremia: recurrent in the setting of leaving AMA on previous hospital stay, admitting blood cultures on 5/8+ for MSSA.  TTE unremarkable, Spencer [name] initially declined TEE on 5/13 discussed with cardiology, also some concern of moderate sedation, also high risk given cervical fracture. Spencer [name] does not have decision-making capacity. Repeat blood culture on 03/15/20 NGTD x5 days. Continue nafcillin for 6 total weeks until 04/26/20 as per ID recommendations. MRI C-spine & left shoulder next week to look for septic arthritis as per ID  Intermittent episodes of agitation: psych recommends low dose Haldol 2 mg in the future  Minimally displaced C4 fracture: recommendation to wear Aspen collar at all times however pt has been declining it. No symptoms of cervical myelopathy. Neurosurgery previously evaluated, Spencer [name] okay to undergo TEE as long as the Aspen collar remains in place during the entirety of the procedure. Outpatient  follow-up with neurosurgery clinic Patsey Berthold, NP)  AKI: prerenal etiology related to dehydration related to DKA on admission. Resolved  Cocaine abuse: UDS positive for cocaine on admission. Illicit drug abuse cessation counseling   Chest pain: resolved. W/ flat troponins & trending down. Likely secondary to demand ischemia. Will continue to monitor   Hypertension: continue home amlodipine, losartan   Thrombocytosis: etiology unclear, possibly reactive. Trending down today. Will continue to monitor  Leukocytosis: likely secondary to MSSA bacteremia. Continue on IV abxs    DVT prophylaxis: lovenox  Code Status: full Family Communication:  Disposition Plan: will need 6 weeks of IV abxs as per ID. PT recs home health but pt cannot do IV abxs at home w/ hx of drug abuse so will have to stay until IV abxs are complete.    Status is: Inpatient  Remains inpatient appropriate because:IV treatments appropriate due to intensity of illness or inability to take PO , unsafe d/c plan   Dispo: The Spencer [name] is from: Home              Anticipated d/c is to: SNF vs home health vs AMA as pt has left AMA                              before              Anticipated d/c date is: > 3 days              Spencer [name] currently is not medically stable to d/c.    Consultants:  ID Psych Cardio  Procedures:    Antimicrobials: nafcillin    Subjective: Pt c/o is refusing IV abxs today and wanted to [facility]. Pt is tired on being the hospital.  Objective: Vitals:   03/27/20 0810 03/27/20 1639 03/27/20 2330 03/28/20 0300  BP: (!) 166/98 (!) 179/97 (!) 162/97 (!) 169/97  Pulse: 88 94 91 90  Resp: 17  17 18   Temp: 98 F (36.7 C) (!) 97.5 F (36.4 C) 98.3 F (36.8 C) (!) 97.4 F (36.3 C)  TempSrc:  Oral Oral Oral  SpO2: 97% 97% 97% 98%  Weight:      Height:        Intake/Output Summary (Last 24 hours) at 03/28/2020 0724 Last data filed at 03/28/2020 0500 Gross per 24 hour  Intake 565.63 ml    Output 975 ml  Net -409.37 ml   Filed Weights   03/13/20 1654 03/13/20 2055  Weight: 59 kg 60.3 kg    Examination:  General exam: Appears calm and comfortable. Disheveled   Respiratory system: decreased breath sounds b/l otherwise clear. No rales, rhonchi. Cardiovascular system: S1 & S2 +. No rubs, gallops or clicks.  Gastrointestinal system: Abdomen is nondistended, soft and nontender. Normal bowel sounds heard. Central nervous system: Alert and oriented. Moves all 4 extremities  Psychiatry: Judgement and insight appear abnormal. Agitated    Data Reviewed: I have personally reviewed following labs and imaging studies  CBC: Recent Labs  Lab 03/24/20 0838 03/25/20 0658 03/26/20 0424 03/27/20 0441 03/28/20 0404  WBC 12.5* 11.6* 10.9* 12.1* 12.1*  HGB 8.5* 8.2* 8.9* 8.9* 8.5*  HCT 26.7* 25.5* 26.9* 27.3* 27.2*  MCV 93.7 92.7 91.2 92.2 93.2  PLT 762* 737* 811* 804* 687*   Basic Metabolic Panel: Recent Labs  Lab 03/24/20 0838 03/25/20 0658 03/26/20 0424 03/27/20 0441 03/28/20 0404  NA 131* 137 137 137 135  K 4.2 3.7 4.1 3.8 4.3  CL 103 107 105 106 107  CO2 20* 23 24 24 24   GLUCOSE 599* 196* 274* 106* 288*  BUN 31* 24* 27* 26* 26*  CREATININE 0.80 0.50* 0.55* 0.52* 0.62  CALCIUM 7.6* 8.1* 8.1* 8.2* 8.0*   GFR: Estimated Creatinine Clearance: 82.7 mL/min (by C-G formula based on SCr of 0.62 mg/dL). Liver Function Tests: No results for input(s): AST, ALT, ALKPHOS, BILITOT, PROT, ALBUMIN in the last 168 hours. No results for input(s): LIPASE, AMYLASE in the last 168 hours. No results for input(s): AMMONIA in the last 168 hours. Coagulation Profile: No results for input(s): INR, PROTIME in the last 168 hours. Cardiac Enzymes: No results for input(s): CKTOTAL, CKMB, CKMBINDEX, TROPONINI in the last 168 hours. BNP (last 3 results) No results for input(s): PROBNP in the last 8760 hours. HbA1C: No results for input(s): HGBA1C in the last 72 hours. CBG: Recent  Labs  Lab 03/26/20 2106 03/27/20 0811 03/27/20 1129 03/27/20 1633 03/27/20 2110  GLUCAP 145* 236* 240* 123* 260*   Lipid Profile: No results for input(s): CHOL, HDL, LDLCALC, TRIG, CHOLHDL, LDLDIRECT in the last 72 hours. Thyroid Function Tests: No results for input(s): TSH, T4TOTAL, FREET4, T3FREE, THYROIDAB in the last 72 hours. Anemia Panel: No results for input(s): VITAMINB12, FOLATE, FERRITIN, TIBC, IRON, RETICCTPCT in the last 72 hours. Sepsis Labs: No results for input(s): PROCALCITON, LATICACIDVEN in the last 168 hours.  No results found for this or any previous visit (from the past 240 hour(s)).       Radiology Studies: No results found.      Scheduled Meds: .  amLODipine  10 mg Oral Daily  . aspirin EC  81 mg Oral Daily  . enoxaparin (LOVENOX) injection  40 mg Subcutaneous Q24H  . famotidine  20 mg Oral Daily  . insulin aspart  0-15 Units Subcutaneous TID WC  . insulin aspart  0-5 Units Subcutaneous QHS  . insulin aspart  12 Units Subcutaneous TID WC  . insulin glargine  30 Units Subcutaneous Daily  . losartan  50 mg Oral Daily  . methocarbamol  500 mg Oral TID  . multivitamin with minerals  1 tablet Oral Daily  . polyethylene glycol  17 g Oral Daily  . Ensure Max Protein  11 oz Oral BID BM   Continuous Infusions: . sodium chloride 500 mL (03/27/20 1904)  . nafcillin (NAFCIL) continuous infusion 12 g (03/27/20 1907)     LOS: 15 days    Time spent: 30 mins     Wyvonnia Dusky, MD [facility] Pager [phone]  If 7PM-7AM, please contact night-coverage [email or web address] 03/28/2020, 7:24 AM

## 2020-03-28 NOTE — Progress Notes (Signed)
Patient resting in bed, more calm and cooperative than this am. He let me restart his antibiotics. Bed alarm on and call bell in reach.

## 2020-03-29 ENCOUNTER — Inpatient Hospital Stay: Payer: Medicaid Other

## 2020-03-29 LAB — BASIC METABOLIC PANEL
Anion gap: 7 (ref 5–15)
BUN: 28 mg/dL — ABNORMAL HIGH (ref 8–23)
CO2: 22 mmol/L (ref 22–32)
Calcium: 8.2 mg/dL — ABNORMAL LOW (ref 8.9–10.3)
Chloride: 109 mmol/L (ref 98–111)
Creatinine, Ser: 0.55 mg/dL — ABNORMAL LOW (ref 0.61–1.24)
GFR calc Af Amer: >60 mL/min (ref >60)
GFR calc non Af Amer: >60 mL/min (ref >60)
Glucose, Bld: 202 mg/dL — ABNORMAL HIGH (ref 70–99)
Potassium: 3.7 mmol/L (ref 3.5–5.1)
Sodium: 138 mmol/L (ref 135–145)

## 2020-03-29 LAB — GLUCOSE, CAPILLARY
Glucose-Capillary: 92 mg/dL (ref 70–99)
Glucose-Capillary: 225 mg/dL — ABNORMAL HIGH (ref 70–99)
Glucose-Capillary: 140 mg/dL — ABNORMAL HIGH (ref 70–99)
Glucose-Capillary: 132 mg/dL — ABNORMAL HIGH (ref 70–99)
Glucose-Capillary: 179 mg/dL — ABNORMAL HIGH (ref 70–99)

## 2020-03-29 LAB — CBC
HCT: 26.6 % — ABNORMAL LOW (ref 39.0–52.0)
Hemoglobin: 8.8 g/dL — ABNORMAL LOW (ref 13.0–17.0)
MCH: 30.6 pg (ref 26.0–34.0)
MCHC: 33.1 g/dL (ref 30.0–36.0)
MCV: 92.4 fL (ref 80.0–100.0)
Platelets: 641 10*3/uL — ABNORMAL HIGH (ref 150–400)
RBC: 2.88 MIL/uL — ABNORMAL LOW (ref 4.22–5.81)
RDW: 15.4 % (ref 11.5–15.5)
WBC: 15.5 10*3/uL — ABNORMAL HIGH (ref 4.0–10.5)
nRBC: 0 % (ref 0.0–0.2)

## 2020-03-29 MED ORDER — GADOBUTROL 1 MMOL/ML IV SOLN
6.0000 mL | Freq: Once | INTRAVENOUS | Status: AC | PRN
  Administered 2020-03-29: 6 mL via INTRAVENOUS

## 2020-03-29 MED ORDER — GLUCERNA SHAKE PO LIQD
237.0000 mL | Freq: Two times a day (BID) | ORAL | Status: DC
  Administered 2020-03-30 – 2020-04-28 (×49): 237 mL via ORAL

## 2020-03-29 MED ORDER — HALOPERIDOL LACTATE 5 MG/ML IJ SOLN
5.0000 mg | Freq: Once | INTRAMUSCULAR | Status: AC
  Administered 2020-03-29: 5 mg via INTRAVENOUS
  Filled 2020-03-29: qty 1

## 2020-03-29 MED ORDER — INSULIN GLARGINE 100 UNIT/ML ~~LOC~~ SOLN
34.0000 [IU] | Freq: Every day | SUBCUTANEOUS | Status: DC
  Administered 2020-03-30 – 2020-04-02 (×4): 34 [IU] via SUBCUTANEOUS
  Filled 2020-03-29 (×5): qty 0.34

## 2020-03-29 MED ORDER — HYDRALAZINE HCL 20 MG/ML IJ SOLN
10.0000 mg | Freq: Four times a day (QID) | INTRAMUSCULAR | Status: DC | PRN
  Administered 2020-03-30 – 2020-04-23 (×11): 10 mg via INTRAVENOUS
  Filled 2020-03-29: qty 1
  Filled 2020-03-29 (×2): qty 0.5
  Filled 2020-03-29: qty 1
  Filled 2020-03-29 (×2): qty 0.5
  Filled 2020-03-29 (×2): qty 1
  Filled 2020-03-29: qty 0.5
  Filled 2020-03-29 (×2): qty 1
  Filled 2020-03-29: qty 0.5

## 2020-03-29 MED ORDER — GADOBUTROL 1 MMOL/ML IV SOLN: 6.0000 mL | Freq: Once | INTRAVENOUS | Status: DC | PRN

## 2020-03-29 NOTE — Progress Notes (Signed)
PROGRESS NOTE    Spencer [name]  AQT:[id number] DOB: Mar 14, 1961 DOA: 03/13/2020 PCP: Patient, No Pcp Per      Assessment & Plan:   Principal Problem:   MSSA bacteremia Active Problems:   DKA (diabetic ketoacidoses) ([place])   Hypertension   Diabetic neuropathy ([place])   DM2 (diabetes mellitus, type 2) ([place])   History of cocaine abuse ([place])   Personal history of subdural hematoma   AKI (acute kidney injury) ([place])   GERD (gastroesophageal reflux disease)   C4 cervical fracture ([facility])   Cigarette smoker   Pressure injury of skin   Lack of medical decision-making capacity: evaluated by psychiatry on 5/15 patient has impaired medical decision-making capacity, currently agreeable to continuing inpatient care. No IVC in place, cannot hold pt against his will as per CM. Pt's sister is working on getting legal guardianship over this pt  DM2: poorly controlled, A1c 12.6. Presented in DKA in setting of poor insulin adherence. Continue on lantus, novolog, & SSI w/ accuchecks   MSSA bacteremia: recurrent in the setting of leaving AMA on previous hospital stay, admitting blood cultures on 5/8+ for MSSA.  TTE unremarkable, patient initially declined TEE on 5/13 discussed with cardiology, also some concern of moderate sedation, also high risk given cervical fracture. Patient does not have decision-making capacity. Repeat blood culture on 03/15/20 NGTD x5 days. Continue nafcillin for 6 total weeks until 04/26/20 as per ID recommendations. MRI C-spine & left shoulder next week to look for septic arthritis as per ID  Intermittent episodes of agitation: psych recommends low dose Haldol 2 mg in the future  Minimally displaced C4 fracture: recommendation to wear Aspen collar at all times however pt has been declining it. No symptoms of cervical myelopathy. Neurosurgery previously evaluated, patient okay to undergo TEE as long as the Aspen collar remains in place during the entirety of the procedure. Outpatient  follow-up with neurosurgery clinic Lonell Face, NP)  Fall: overnight on 03/29/20 and hit his head. CT brain shows no acute intracranial abnormality, frontal scalp swelling w/o evidence of an underlying fracture   AKI: prerenal etiology related to dehydration related to DKA on admission. Resolved  Cocaine abuse: UDS positive for cocaine on admission. Illicit drug abuse cessation counseling   Chest pain: resolved. W/ flat troponins & trending down. Likely secondary to demand ischemia. Will continue to monitor   Hypertension: continue home amlodipine, losartan   Thrombocytosis: etiology unclear, possibly reactive. Trending down today again. Will continue to monitor  Leukocytosis: likely secondary to MSSA bacteremia. Continue on IV abxs    DVT prophylaxis: lovenox  Code Status: full Family Communication:  Disposition Plan: will need 6 weeks of IV abxs as per ID. PT recs home health but pt cannot do IV abxs at home w/ hx of drug abuse so will have to stay until IV abxs are complete.    Status is: Inpatient  Remains inpatient appropriate because:IV treatments appropriate due to intensity of illness or inability to take PO , unsafe d/c plan   Dispo: The patient is from: Home              Anticipated d/c is to: SNF vs home health vs AMA as pt has left AMA                              before              Anticipated d/c date is: >  3 days              Patient currently is not medically stable to d/c.    Consultants:  ID Psych Cardio  Procedures:    Antimicrobials: nafcillin    Subjective: Pt c/o headache  Objective: Vitals:   03/29/20 0500 03/29/20 0530 03/29/20 0600 03/29/20 0630  BP: (!) 183/104 (!) 181/99 (!) 169/112 (!) 153/88  Pulse: (!) 122 (!) 118 (!) 114 97  Resp:   18   Temp:   98.7 F (37.1 C)   TempSrc:   Oral   SpO2:   98%   Weight:      Height:        Intake/Output Summary (Last 24 hours) at 03/29/2020 0729 Last data filed at 03/29/2020  0349 Gross per 24 hour  Intake 233.48 ml  Output 1101 ml  Net -867.52 ml   Filed Weights   03/13/20 1654 03/13/20 2055  Weight: 59 kg 60.3 kg    Examination:  General exam: Appears calm but uncomfortable. Disheveled   Respiratory system: diminished breath sounds b/l otherwise clear. No wheezes. Cardiovascular system: S1 & S2 +. No rubs, gallops or clicks.  Gastrointestinal system: Abdomen is nondistended, soft and nontender. Hypoactive bowel sounds heard. Central nervous system: Alert and oriented. Moves all 4 extremities  Psychiatry: Judgement and insight appear abnormal. Flat mood and affect    Data Reviewed: I have personally reviewed following labs and imaging studies  CBC: Recent Labs  Lab 03/25/20 0658 03/26/20 0424 03/27/20 0441 03/28/20 0404 03/29/20 0607  WBC 11.6* 10.9* 12.1* 12.1* 15.5*  HGB 8.2* 8.9* 8.9* 8.5* 8.8*  HCT 25.5* 26.9* 27.3* 27.2* 26.6*  MCV 92.7 91.2 92.2 93.2 92.4  PLT 737* 811* 804* 687* 641*   Basic Metabolic Panel: Recent Labs  Lab 03/25/20 0658 03/26/20 0424 03/27/20 0441 03/28/20 0404 03/29/20 0607  NA 137 137 137 135 138  K 3.7 4.1 3.8 4.3 3.7  CL 107 105 106 107 109  CO2 23 24 24 24 22   GLUCOSE 196* 274* 106* 288* 202*  BUN 24* 27* 26* 26* 28*  CREATININE 0.50* 0.55* 0.52* 0.62 0.55*  CALCIUM 8.1* 8.1* 8.2* 8.0* 8.2*   GFR: Estimated Creatinine Clearance: 82.7 mL/min (A) (by C-G formula based on SCr of 0.55 mg/dL (L)). Liver Function Tests: No results for input(s): AST, ALT, ALKPHOS, BILITOT, PROT, ALBUMIN in the last 168 hours. No results for input(s): LIPASE, AMYLASE in the last 168 hours. No results for input(s): AMMONIA in the last 168 hours. Coagulation Profile: No results for input(s): INR, PROTIME in the last 168 hours. Cardiac Enzymes: No results for input(s): CKTOTAL, CKMB, CKMBINDEX, TROPONINI in the last 168 hours. BNP (last 3 results) No results for input(s): PROBNP in the last 8760 hours. HbA1C: No  results for input(s): HGBA1C in the last 72 hours. CBG: Recent Labs  Lab 03/27/20 1633 03/27/20 2110 03/28/20 0734 03/28/20 1140 03/28/20 1649  GLUCAP 123* 260* 243* 132* 229*   Lipid Profile: No results for input(s): CHOL, HDL, LDLCALC, TRIG, CHOLHDL, LDLDIRECT in the last 72 hours. Thyroid Function Tests: No results for input(s): TSH, T4TOTAL, FREET4, T3FREE, THYROIDAB in the last 72 hours. Anemia Panel: No results for input(s): VITAMINB12, FOLATE, FERRITIN, TIBC, IRON, RETICCTPCT in the last 72 hours. Sepsis Labs: No results for input(s): PROCALCITON, LATICACIDVEN in the last 168 hours.  No results found for this or any previous visit (from the past 240 hour(s)).       Radiology Studies: CT HEAD  WO CONTRAST  Result Date: 03/29/2020 CLINICAL DATA:  Ataxia. Head trauma. Altered mental status. EXAM: CT HEAD WITHOUT CONTRAST TECHNIQUE: Contiguous axial images were obtained from the base of the skull through the vertex without intravenous contrast. COMPARISON:  CT head dated 03/03/2020 FINDINGS: Brain: No evidence of acute infarction, hemorrhage, hydrocephalus, extra-axial collection or mass lesion/mass effect. Atrophy and chronic microvascular ischemic changes are noted. Vascular: No hyperdense vessel or unexpected calcification. Skull: There is frontal scalp swelling without evidence for an underlying fracture. Old burr holes are noted. Sinuses/Orbits: There is mucosal thickening of the left sphenoid sinus. Otherwise, the remaining paranasal sinuses and mastoid air cells are essentially clear. Other: None. IMPRESSION: 1. No acute intracranial abnormality. 2. Frontal scalp swelling without evidence for an underlying fracture. Electronically Signed   By: Katherine Mantle M.D.   On: 03/29/2020 03:32        Scheduled Meds: . amLODipine  10 mg Oral Daily  . aspirin EC  81 mg Oral Daily  . enoxaparin (LOVENOX) injection  40 mg Subcutaneous Q24H  . famotidine  20 mg Oral Daily  .  insulin aspart  0-15 Units Subcutaneous TID WC  . insulin aspart  0-5 Units Subcutaneous QHS  . insulin aspart  12 Units Subcutaneous TID WC  . insulin glargine  30 Units Subcutaneous Daily  . losartan  50 mg Oral Daily  . methocarbamol  500 mg Oral TID  . multivitamin with minerals  1 tablet Oral Daily  . polyethylene glycol  17 g Oral Daily  . Ensure Max Protein  11 oz Oral BID BM   Continuous Infusions: . sodium chloride 500 mL (03/27/20 1904)  . nafcillin (NAFCIL) continuous infusion 12 g (03/29/20 0414)     LOS: 16 days    Time spent: 31 mins     Charise Killian, MD [facility] Pager [phone]  If 7PM-7AM, please contact night-coverage [email or web address] 03/29/2020, 7:29 AM

## 2020-03-29 NOTE — Progress Notes (Signed)
Was called to patients room. He had a witnessed fall. He was noted to hit head on wall as he was falling. Patient has an abrasion on his right wrist and left side of his forehead. [name] notified and CT ordered for patient. Neuro assessment was normal to patient's baseline. Will continue to monitor.

## 2020-03-29 NOTE — Progress Notes (Signed)
Nutrition Follow-up  DOCUMENTATION CODES:   Severe malnutrition in context of social or environmental circumstances  INTERVENTION:  Provide Glucerna Shake po BID, each supplement provides 220 kcal and 10 grams of protein.  Continue double protein portions at meals.  Continue daily MVI.  NUTRITION DIAGNOSIS:   Severe Malnutrition related to social / environmental circumstances(hx substance abuse, inadequate oral intake) as evidenced by severe fat depletion, severe muscle depletion.  Ongoing.  GOAL:   Patient will meet greater than or equal to 90% of their needs  Met  MONITOR:   PO intake, Supplement acceptance, Labs, Weight trends, I & O's, Skin  REASON FOR ASSESSMENT:   Consult Assessment of nutrition requirement/status  ASSESSMENT:   59 year old male with PMHx of HTN, substance abuse, DM, GERD, brain aneurysm admitted with DKA, MSSA bacteremia, AKI, metabolic encephalopathy.  Met with patient at bedside. He reports his appetite remains good. He is eating 100% of his meals. Patient is receiving double protein portions at meals. In the past 24 hours patient has had approximately 1700 kcal and 90 grams of protein from meals alone. Will switch oral nutrition supplement to Glucerna as patient is receiving adequate protein from meals but needs a higher calorie supplement.  Medications reviewed and include: famotidine, Novolog 0-15 units TID, Novolog 0-5 units QHS, Novolog 12 units TID with meals, Lantus 34 units daily, MVI daily, Miralax 17 grams daily, nafcillin.  Labs reviewed: CBG 92-225, BUN 28, Creatinine 0.55.  Diet Order:   Diet Order            Diet Carb Modified Fluid consistency: Thin; Room service appropriate? Yes with Assist  Diet effective now             EDUCATION NEEDS:   Not appropriate for education at this time  Skin:  Skin Assessment: Skin Integrity Issues:(Stage 2 right ischial tuberosity; unstageable sacrum (1cm x 1.8cm); unstageable right  elbow (2cm x 1.5cm x 0.1cm))  Last BM:  03/28/2020 - large type 3  Height:   Ht Readings from Last 1 Encounters:  03/13/20 _0  (1.778 m)   Weight:   Wt Readings from Last 1 Encounters:  03/13/20 60.3 kg   Ideal Body Weight:  75.5 kg  BMI:  Body mass index is 19.07 kg/m.  Estimated Nutritional Needs:   Kcal:  1800-2100  Protein:  90-100 grams  Fluid:  1.8-2 L/day  Jacklynn Barnacle, MS, RD, LDN Pager number available on Amion

## 2020-03-29 NOTE — Progress Notes (Signed)
ID Pt lying in bed with eyes closed as usual-  Responds to questions appropriately but irritable Left shoulder pain persist Sitter at bed side as he got up and fell  O/e in no distress BP (!) 158/99 (BP Location: Left Arm)   Pulse 94   Temp (!) 97.5 F (36.4 C) (Oral)   Resp 17   Ht 5\' 10"  (1.778 m)   Wt 60.3 kg   SpO2 96%   BMI 19.07 kg/m   Left shoulder more warm than rt restricted abduction Some swelling  Labs CBC Latest Ref Rng & Units 03/29/2020 03/28/2020 03/27/2020  WBC 4.0 - 10.5 K/uL 15.5(H) 12.1(H) 12.1(H)  Hemoglobin 13.0 - 17.0 g/dL 03/29/2020) 6.4(W) 8.9(L)  Hematocrit 39.0 - 52.0 % 26.6(L) 27.2(L) 27.3(L)  Platelets 150 - 400 K/uL 641(H) 687(H) 804(H)    CMP Latest Ref Rng & Units 03/29/2020 03/28/2020 03/27/2020  Glucose 70 - 99 mg/dL 03/29/2020) 212(Y) 482(N)  BUN 8 - 23 mg/dL 003(B) 04(U) 88(B)  Creatinine 0.61 - 1.24 mg/dL 16(X) 4.50(T 8.88)  Sodium 135 - 145 mmol/L 138 135 137  Potassium 3.5 - 5.1 mmol/L 3.7 4.3 3.8  Chloride 98 - 111 mmol/L 109 107 106  CO2 22 - 32 mmol/L 22 24 24   Calcium 8.9 - 10.3 mg/dL 8.2(L) 8.0(L) 8.2(L)  Total Protein 6.5 - 8.1 g/dL - - -  Total Bilirubin 0.3 - 1.2 mg/dL - - -  Alkaline Phos 38 - 126 U/L - - -  AST 15 - 41 U/L - - -  ALT 0 - 44 U/L - - -   Microbiology: 03/04/2020 blood culture staph aureus  03/06/2020 blood culture negative  Blood culture from 5/8 staph aureus  5/10 blood cultureNegative   Impression/Recommendation  Staph aureus bacteremiain a patient who is using cocaine.  Concern for endocarditis and also concern for vertebral infection. He has fracture of the C4 vertebrae and MRI done on 5/12 ( a poor film due to motion degradation)  showed prevertebral effusion extending the length of the cervical spine with 72mm thickness . There was no involvement of the bone or the disc but the film was limited by movement. We will have to treat this like a deep-seated infection with 6 weeks of IV  antibiotics.04/26/20 Currently on nafcillin  Thrombocytosis- likely due to infection- raises the concern for an abscess or collection  Left shoulder swelling and warmth and restricted mobility- r/o septic arthritis Recommend  MRI cervical spine and left shoulder   Patient had left AMA twice before. Very likely will have to be placed in a nursing facility or rehab to get his IV antibiotics. Cardiology has deferred TEE because of patient's overall situation.  C4 vertebral fracture. Needs Aspencollar to be worn all the time.but he refuses  Diabetes mellitus noncompliant with medication and hence gets admitted with DKA.  Cocaine abuse  Discussed the management with patient and hospitalist

## 2020-03-29 NOTE — Plan of Care (Signed)
  Problem: Health Behavior/Discharge Planning: Goal: Ability to manage health-related needs will improve Outcome: Progressing   Problem: Metabolic: Goal: Ability to maintain appropriate glucose levels will improve Outcome: Progressing   

## 2020-03-29 NOTE — Progress Notes (Signed)
Spencer [name] notified that patient is still restless, agitated and impulsive despite administering haldol and pain medication. She ordered IV ativan.

## 2020-03-29 NOTE — Progress Notes (Signed)
Inpatient Diabetes Program Recommendations  AACE/ADA: New Consensus Statement on Inpatient Glycemic Control (2015)  Target Ranges:  Prepandial:   less than 140 mg/dL      Peak postprandial:   less than 180 mg/dL (1-2 hours)      Critically ill patients:  140 - 180 mg/dL   Lab Results  Component Value Date   GLUCAP 225 (H) 03/29/2020   HGBA1C 12.6 (H) 02/15/2020    Review of Glycemic Control Results for PEARLY, APACHITO (MRN [id number]) as of 03/29/2020 10:14  Ref. Range 03/28/2020 07:34 03/28/2020 11:40 03/28/2020 16:49 03/28/2020 21:50 03/29/2020 08:10  Glucose-Capillary Latest Ref Range: 70 - 99 mg/dL 497 (H) 530 (H) 051 (H) 92 225 (H)   Diabetes history: DM 2 Outpatient Diabetes medications: Lantus 18 units daily, Novolog 12 units TID with meals Current orders for Inpatient glycemic control: Lantus 30 units daily, Novolog 12 units TID with meals, Novolog 0-15 units TID with meals, Novolog 0-5 units QHS Inpatient Diabetes Program Recommendations:   Consider increasing Lantus to 34 units daily.   Thanks,  Beryl Meager, RN, BC-ADM Inpatient Diabetes Coordinator Pager [phone] (8a-5p)

## 2020-03-29 NOTE — Progress Notes (Addendum)
   03/29/20 0801  Assess: MEWS Score  BP (!) 161/102  Pulse Rate (!) 111  SpO2 97 %  O2 Device Room Air  Assess: MEWS Score  MEWS Temp 0  MEWS Systolic 0  MEWS Pulse 2  MEWS RR 0  MEWS LOC 0  MEWS Score 2  MEWS Score Color Yellow  Assess: if the MEWS score is Yellow or Red  Were vital signs taken at a resting state? Yes  Focused Assessment Documented focused assessment  Early Detection of Sepsis Score *See Row Information* Low  MEWS guidelines implemented *See Row Information* No, previously yellow, continue vital signs every 4 hours  Continued yellow MEWS protocol from 0200.

## 2020-03-29 NOTE — Progress Notes (Signed)
   03/29/20 0210  Assess: MEWS Score  Temp 97.7 F (36.5 C)  BP (!) 189/123  Pulse Rate (!) 117  Resp 20  SpO2 99 %  Assess: MEWS Score  MEWS Temp 0  MEWS Systolic 0  MEWS Pulse 2  MEWS RR 0  MEWS LOC 0  MEWS Score 2  MEWS Score Color Yellow  Assess: if the MEWS score is Yellow or Red  Were vital signs taken at a resting state? Yes  Focused Assessment Documented focused assessment  Early Detection of Sepsis Score *See Row Information* Low  MEWS guidelines implemented *See Row Information* Yes  Treat  MEWS Interventions Escalated (See documentation below)  Take Vital Signs  Increase Vital Sign Frequency  Yellow: Q 2hr X 2 then Q 4hr X 2, if remains yellow, continue Q 4hrs  Escalate  MEWS: Escalate Yellow: discuss with charge nurse/RN and consider discussing with provider and RRT  Notify: Charge Nurse/RN  Name of Charge Nurse/RN Notified Rosey Bath   Date Charge Nurse/RN Notified 03/29/20  Time Charge Nurse/RN Notified [phone]  Notify: Provider  Provider Name/Title [name]  Date Provider Notified 03/29/20  Time Provider Notified [phone]  Notification Type Face-to-face  Notification Reason Change in status;Other (Comment) (patient fall)  Response See new orders  Date of Provider Response 03/29/20  Time of Provider Response [id number]

## 2020-03-30 LAB — CBC
HCT: 28.6 % — ABNORMAL LOW (ref 39.0–52.0)
Hemoglobin: 9.5 g/dL — ABNORMAL LOW (ref 13.0–17.0)
MCH: 29.9 pg (ref 26.0–34.0)
MCHC: 33.2 g/dL (ref 30.0–36.0)
MCV: 89.9 fL (ref 80.0–100.0)
Platelets: 590 10*3/uL — ABNORMAL HIGH (ref 150–400)
RBC: 3.18 MIL/uL — ABNORMAL LOW (ref 4.22–5.81)
RDW: 15.7 % — ABNORMAL HIGH (ref 11.5–15.5)
WBC: 9.3 10*3/uL (ref 4.0–10.5)
nRBC: 0 % (ref 0.0–0.2)

## 2020-03-30 LAB — BASIC METABOLIC PANEL
Anion gap: 7 (ref 5–15)
BUN: 34 mg/dL — ABNORMAL HIGH (ref 8–23)
CO2: 23 mmol/L (ref 22–32)
Calcium: 8.3 mg/dL — ABNORMAL LOW (ref 8.9–10.3)
Chloride: 109 mmol/L (ref 98–111)
Creatinine, Ser: 0.62 mg/dL (ref 0.61–1.24)
GFR calc Af Amer: >60 mL/min (ref >60)
GFR calc non Af Amer: >60 mL/min (ref >60)
Glucose, Bld: 193 mg/dL — ABNORMAL HIGH (ref 70–99)
Potassium: 4.2 mmol/L (ref 3.5–5.1)
Sodium: 139 mmol/L (ref 135–145)

## 2020-03-30 LAB — GLUCOSE, CAPILLARY
Glucose-Capillary: 137 mg/dL — ABNORMAL HIGH (ref 70–99)
Glucose-Capillary: 120 mg/dL — ABNORMAL HIGH (ref 70–99)
Glucose-Capillary: 124 mg/dL — ABNORMAL HIGH (ref 70–99)
Glucose-Capillary: 167 mg/dL — ABNORMAL HIGH (ref 70–99)

## 2020-03-30 MED ORDER — KETOROLAC TROMETHAMINE 15 MG/ML IJ SOLN
15.0000 mg | Freq: Four times a day (QID) | INTRAMUSCULAR | Status: AC | PRN
  Administered 2020-03-30 – 2020-04-02 (×3): 15 mg via INTRAVENOUS
  Filled 2020-03-30 (×3): qty 1

## 2020-03-30 MED ORDER — IPRATROPIUM-ALBUTEROL 0.5-2.5 (3) MG/3ML IN SOLN
3.0000 mL | RESPIRATORY_TRACT | Status: DC | PRN
  Administered 2020-03-30: 3 mL via RESPIRATORY_TRACT
  Filled 2020-03-30: qty 3

## 2020-03-30 NOTE — Progress Notes (Signed)
Physical Therapy Treatment Patient Details Name: Spencer [name] MRN: [id number] DOB: 1961/01/30 Today's Date: 03/30/2020    History of Present Illness Pt admitted for MSSA bactermia and elevated BG. Pt with recent admission for similar issues, left AMA. HIstory includes DM, cocaine abuse, brain aneurysm, HTN, and GERD. Last admission with + C4 fx with Aspen collar in place.    PT Comments    Pt was asleep in supine upon arriving with sitter present. He easily awakes and is agreeable to PT session with minimal encouragement. He was on 2 L o2 [place] upon arriving with sao2 98%. Was able to discontinue O2 and maintain > 94% throughout. HR elevated form 87 bpm at rest, elevated to 115 bpm during ambulation but with seated rest resolves quickly. Pt did have fall previous date. He is impulsive with poor safety awareness. Needs encouragement to wear cervical collar but overall tolerated session well. Was able to exit R sid eof bed via log roll technique with supervision only/. Stood with CGA for safety, ambulated into bathroom to urinate prior to ambulation 400 ft into hallway. Pt tends to let go of RW and has unsteadiness present. Cues throughout for safety reminders. He returned to room after ambulation and was repositioned in recliner, with cervical collar donned, BLEs elevated, call bell in reach, chair alarm in place, and sitter at bedside. Acute PT will continue to follow per current POC.     Follow Up Recommendations  Home health PT;Supervision for mobility/OOB     Equipment Recommendations  None recommended by PT    Recommendations for Other Services       Precautions / Restrictions Precautions Precautions: Fall Required Braces or Orthoses: Cervical Brace Cervical Brace: Hard collar Restrictions Weight Bearing Restrictions: No    Mobility  Bed Mobility Overal bed mobility: Modified Independent Bed Mobility: Sidelying to Sit;Sit to Supine Rolling: Supervision Sidelying to sit:  Supervision       General bed mobility comments: Pt was able to perform proper log roll technique with adhering to precautions. Cervical brace applied in supine prior to exiting bed. He still requires total assist to properly place cervical brace.  Transfers Overall transfer level: Needs assistance Equipment used: Rolling walker (2 wheeled) Transfers: Sit to/from Stand Sit to Stand: Min guard         General transfer comment: CGA for safety. pt cued for improved technique. He was able to stand from lowest bed height on low bed without lifting assitance  Ambulation/Gait Ambulation/Gait assistance: Min guard Gait Distance (Feet): 400 Feet Assistive device: Rolling walker (2 wheeled) Gait Pattern/deviations: Scissoring;Narrow base of support Gait velocity: varied cadence 2/2 to impulsivity   General Gait Details: pt ambulate 400 ft with RW. he tends to amblulate with narrow BOS and has episodes of unsteadiness however did not require assistance from therapist. CGA for safety throughout.   Stairs             Wheelchair Mobility    Modified Rankin (Stroke Patients Only)       Balance Overall balance assessment: History of Falls;Needs assistance Sitting-balance support: Feet supported Sitting balance-Leahy Scale: Good Sitting balance - Comments: no LOB in sitting with reaching outside BOS   Standing balance support: Bilateral upper extremity supported Standing balance-Leahy Scale: Fair Standing balance comment: pt demonstrates good balance with BUE support however only fair balance throughout session 2/2 to impulsivity and poor safety awareness. Pt lets go of RW frequently during session and has unsteadiness present  Cognition Arousal/Alertness: Awake/alert Behavior During Therapy: WFL for tasks assessed/performed;Impulsive Overall Cognitive Status: History of cognitive impairments - at baseline                                  General Comments: Pt with poor safety awareness, tries to decline wearing cervical collar during session. Needs encouragement to participate with therapy on any level. Only motivated because he needs to use commode.      Exercises      General Comments        Pertinent Vitals/Pain Pain Assessment: No/denies pain Pain Score: 0-No pain Pain Descriptors / Indicators: Discomfort(with wearing brace) Pain Intervention(s): Limited activity within patient's tolerance;Monitored during session    Home Living                      Prior Function            PT Goals (current goals can now be found in the care plan section) Acute Rehab PT Goals Patient Stated Goal: To go home Progress towards PT goals: Progressing toward goals    Frequency    Min 2X/week      PT Plan Current plan remains appropriate    Co-evaluation              AM-PAC PT "6 Clicks" Mobility   Outcome Measure  Help needed turning from your back to your side while in a flat bed without using bedrails?: A Little Help needed moving from lying on your back to sitting on the side of a flat bed without using bedrails?: A Little Help needed moving to and from a bed to a chair (including a wheelchair)?: A Little Help needed standing up from a chair using your arms (e.g., wheelchair or bedside chair)?: A Little Help needed to walk in hospital room?: A Little Help needed climbing 3-5 steps with a railing? : A Little 6 Click Score: 18    End of Session Equipment Utilized During Treatment: Cervical collar;Gait belt Activity Tolerance: Patient tolerated treatment well Patient left: in chair;with call bell/phone within reach;with chair alarm set;with nursing/sitter in room Nurse Communication: Mobility status PT Visit Diagnosis: History of falling (Z91.81);Difficulty in walking, not elsewhere classified (R26.2);Pain Pain - Right/Left: Right Pain - part of body: Hip     Time: 7026-3785 PT  Time Calculation (min) (ACUTE ONLY): 27 min  Charges:  $Gait Training: 8-22 mins $Therapeutic Activity: 8-22 mins                     Julaine Fusi PTA 03/30/20, 9:53 AM

## 2020-03-30 NOTE — Progress Notes (Signed)
PROGRESS NOTE    Spencer [name]  TKW:[id number] DOB: 09-Feb-1961 DOA: 03/13/2020 PCP: Patient, No Pcp Per   HPI was taken from Dr. Jens Som: Spencer [name] is a 59 y.o. male with medical history significant for type 2 diabetes mellitus, cocaine abuse,brain aneurysm,hypertension, GERD.  Patient was recently admitted on 03/03/20 for DKA, acute renal failure and patient signed out against medical advice on 03/10/2020.  Patient brought in by EMS.  Patient is not sure why he is here.  As per him his sister called EMS.  Patient states his blood glucose levels were very high.  When asked how compliant is he with his insulin he told me that he is taking his insulin but he does have a history of noncompliance.  He denies any chest pain, shortness of breath.  Denies any abdominal pain, nausea or any vomitings.  No fevers or chills.  Denies any diarrhea.  Does complain of constipation.  Does state that he has not been able to urinate much today.  ED Course: Evaluation in the ER showed that the patient was in DKA.  His blood glucose levels were high, anion gap was 30.  Patient was hydrated on insulin drip in the ER, given IV fluid bolus of 1 L.  WBC count was also significantly elevated.  Chest x-ray, urinalysis were pending.   Assessment & Plan:   Principal Problem:   MSSA bacteremia Active Problems:   DKA (diabetic ketoacidoses) ([place])   Hypertension   Diabetic neuropathy (HCC)   DM2 (diabetes mellitus, type 2) ([place])   History of cocaine abuse ([place])   Personal history of subdural hematoma   AKI (acute kidney injury) ([place])   GERD (gastroesophageal reflux disease)   C4 cervical fracture (HCC)   Cigarette smoker   Pressure injury of skin   Lack of medical decision-making capacity: evaluated by psychiatry on 5/15 patient has impaired medical decision-making capacity, currently agreeable to continuing inpatient care. No IVC in place, cannot hold pt against his will as per CM. Pt's sister is working on getting  legal guardianship over this pt  MSSA bacteremia: recurrent in the setting of leaving AMA on previous hospital stay, admitting blood cultures on 5/8+ for MSSA.  TTE unremarkable, patient initially declined TEE on 5/13 discussed with cardiology, also some concern of moderate sedation, also high risk given cervical fracture. Patient does not have decision-making capacity. Repeat blood culture on 03/15/20 NGTD x5 days. Continue nafcillin for 6 total weeks until 04/26/20 as per ID recommendations. MRI left shoulder diffuse increased signal seen throughout the rotator cuff musculature which could be due to myositis. MRI C-spine is pending   DM2: poorly controlled, A1c 12.6. Presented in DKA in setting of poor insulin adherence. Continue on lantus, novolog, & SSI w/ accuchecks   Intermittent episodes of agitation: psych recommends low dose Haldol 2 mg in the future  Minimally displaced C4 fracture: recommendation to wear Aspen collar at all times however pt has been declining it. No symptoms of cervical myelopathy. Neurosurgery previously evaluated, patient okay to undergo TEE as long as the Aspen collar remains in place during the entirety of the procedure. Outpatient follow-up with neurosurgery clinic Lonell Face, NP)  Fall: overnight on 03/29/20 and hit his head. CT brain shows no acute intracranial abnormality, frontal scalp swelling w/o evidence of an underlying fracture   AKI: prerenal etiology related to dehydration related to DKA on admission. Resolved  Cocaine abuse: UDS positive for cocaine on admission. Illicit drug abuse cessation counseling  Chest pain: resolved. W/ flat troponins & trending down. Likely secondary to demand ischemia. Will continue to monitor   Hypertension: continue home amlodipine, losartan   Thrombocytosis: etiology unclear, possibly reactive. Continues to trend down. Will continue to monitor  Leukocytosis: likely secondary to MSSA bacteremia. Continue on IV abxs     DVT prophylaxis: lovenox  Code Status: full Family Communication:  Disposition Plan: will need 6 weeks of IV abxs as per ID. PT recs home health but pt cannot do IV abxs at home w/ hx of drug abuse so will have to stay until IV abxs are complete.    Status is: Inpatient  Remains inpatient appropriate because:IV treatments appropriate due to intensity of illness or inability to take PO , unsafe d/c plan   Dispo: The patient is from: Home              Anticipated d/c is to: SNF vs home health vs AMA as pt has left AMA                              before              Anticipated d/c date is: > 3 days              Patient currently is not medically stable to d/c.    Consultants:  ID Psych Cardio  Procedures:    Antimicrobials: nafcillin    Subjective: Pt c/o weakness  Objective: Vitals:   03/30/20 0114 03/30/20 0305 03/30/20 0431 03/30/20 0443  BP: (!) 160/104 (!) 169/103 (!) 166/100   Pulse: 96 95 96 (!) 102  Resp: 16 17 18    Temp: (!) 97.4 F (36.3 C) 98.1 F (36.7 C)    TempSrc: Oral Oral    SpO2: 97% 98%  94%  Weight:      Height:        Intake/Output Summary (Last 24 hours) at 03/30/2020 0733 Last data filed at 03/30/2020 0435 Gross per 24 hour  Intake 810 ml  Output 1450 ml  Net -640 ml   Filed Weights   03/13/20 1654 03/13/20 2055  Weight: 59 kg 60.3 kg    Examination:  General exam: Appears calm but uncomfortable. Disheveled   Respiratory system: decreased breath sounds b/l otherwise clear. No wheezes Cardiovascular system: S1 & S2 +. No rubs, gallops or clicks.  Gastrointestinal system: Abdomen is nondistended, soft and nontender. Normal bowel sounds heard. Central nervous system: Alert and oriented. Moves all 4 extremities  Psychiatry: Judgement and insight appear abnormal. Flat mood and affect    Data Reviewed: I have personally reviewed following labs and imaging studies  CBC: Recent Labs  Lab 03/26/20 0424 03/27/20 0441  03/28/20 0404 03/29/20 0607 03/30/20 0344  WBC 10.9* 12.1* 12.1* 15.5* 9.3  HGB 8.9* 8.9* 8.5* 8.8* 9.5*  HCT 26.9* 27.3* 27.2* 26.6* 28.6*  MCV 91.2 92.2 93.2 92.4 89.9  PLT 811* 804* 687* 641* 590*   Basic Metabolic Panel: Recent Labs  Lab 03/26/20 0424 03/27/20 0441 03/28/20 0404 03/29/20 0607 03/30/20 0344  NA 137 137 135 138 139  K 4.1 3.8 4.3 3.7 4.2  CL 105 106 107 109 109  CO2 24 24 24 22 23   GLUCOSE 274* 106* 288* 202* 193*  BUN 27* 26* 26* 28* 34*  CREATININE 0.55* 0.52* 0.62 0.55* 0.62  CALCIUM 8.1* 8.2* 8.0* 8.2* 8.3*   GFR: Estimated Creatinine Clearance: 82.7 mL/min (by  C-G formula based on SCr of 0.62 mg/dL). Liver Function Tests: No results for input(s): AST, ALT, ALKPHOS, BILITOT, PROT, ALBUMIN in the last 168 hours. No results for input(s): LIPASE, AMYLASE in the last 168 hours. No results for input(s): AMMONIA in the last 168 hours. Coagulation Profile: No results for input(s): INR, PROTIME in the last 168 hours. Cardiac Enzymes: No results for input(s): CKTOTAL, CKMB, CKMBINDEX, TROPONINI in the last 168 hours. BNP (last 3 results) No results for input(s): PROBNP in the last 8760 hours. HbA1C: No results for input(s): HGBA1C in the last 72 hours. CBG: Recent Labs  Lab 03/28/20 2150 03/29/20 0810 03/29/20 1146 03/29/20 1727 03/29/20 2118  GLUCAP 92 225* 140* 132* 179*   Lipid Profile: No results for input(s): CHOL, HDL, LDLCALC, TRIG, CHOLHDL, LDLDIRECT in the last 72 hours. Thyroid Function Tests: No results for input(s): TSH, T4TOTAL, FREET4, T3FREE, THYROIDAB in the last 72 hours. Anemia Panel: No results for input(s): VITAMINB12, FOLATE, FERRITIN, TIBC, IRON, RETICCTPCT in the last 72 hours. Sepsis Labs: No results for input(s): PROCALCITON, LATICACIDVEN in the last 168 hours.  No results found for this or any previous visit (from the past 240 hour(s)).       Radiology Studies: CT HEAD WO CONTRAST  Result Date:  03/29/2020 CLINICAL DATA:  Ataxia. Head trauma. Altered mental status. EXAM: CT HEAD WITHOUT CONTRAST TECHNIQUE: Contiguous axial images were obtained from the base of the skull through the vertex without intravenous contrast. COMPARISON:  CT head dated 03/03/2020 FINDINGS: Brain: No evidence of acute infarction, hemorrhage, hydrocephalus, extra-axial collection or mass lesion/mass effect. Atrophy and chronic microvascular ischemic changes are noted. Vascular: No hyperdense vessel or unexpected calcification. Skull: There is frontal scalp swelling without evidence for an underlying fracture. Old burr holes are noted. Sinuses/Orbits: There is mucosal thickening of the left sphenoid sinus. Otherwise, the remaining paranasal sinuses and mastoid air cells are essentially clear. Other: None. IMPRESSION: 1. No acute intracranial abnormality. 2. Frontal scalp swelling without evidence for an underlying fracture. Electronically Signed   By: Katherine Mantle M.D.   On: 03/29/2020 03:32        Scheduled Meds: . amLODipine  10 mg Oral Daily  . aspirin EC  81 mg Oral Daily  . enoxaparin (LOVENOX) injection  40 mg Subcutaneous Q24H  . famotidine  20 mg Oral Daily  . feeding supplement (GLUCERNA SHAKE)  237 mL Oral BID BM  . insulin aspart  0-15 Units Subcutaneous TID WC  . insulin aspart  0-5 Units Subcutaneous QHS  . insulin aspart  12 Units Subcutaneous TID WC  . insulin glargine  34 Units Subcutaneous Daily  . losartan  50 mg Oral Daily  . methocarbamol  500 mg Oral TID  . multivitamin with minerals  1 tablet Oral Daily  . polyethylene glycol  17 g Oral Daily   Continuous Infusions: . sodium chloride 500 mL (03/27/20 1904)  . nafcillin (NAFCIL) continuous infusion 12 g (03/30/20 0629)     LOS: 17 days    Time spent: 30 mins     Charise Killian, MD [facility] Pager [phone]  If 7PM-7AM, please contact night-coverage [email or web address] 03/30/2020, 7:33 AM

## 2020-03-31 DIAGNOSIS — E0865 Diabetes mellitus due to underlying condition with hyperglycemia: Secondary | ICD-10-CM

## 2020-03-31 LAB — CBC
HCT: 28.3 % — ABNORMAL LOW (ref 39.0–52.0)
Hemoglobin: 9.1 g/dL — ABNORMAL LOW (ref 13.0–17.0)
MCH: 29.8 pg (ref 26.0–34.0)
MCHC: 32.2 g/dL (ref 30.0–36.0)
MCV: 92.8 fL (ref 80.0–100.0)
Platelets: 552 10*3/uL — ABNORMAL HIGH (ref 150–400)
RBC: 3.05 MIL/uL — ABNORMAL LOW (ref 4.22–5.81)
RDW: 15.9 % — ABNORMAL HIGH (ref 11.5–15.5)
WBC: 9.8 10*3/uL (ref 4.0–10.5)
nRBC: 0 % (ref 0.0–0.2)

## 2020-03-31 LAB — GLUCOSE, CAPILLARY
Glucose-Capillary: 76 mg/dL (ref 70–99)
Glucose-Capillary: 332 mg/dL — ABNORMAL HIGH (ref 70–99)
Glucose-Capillary: 75 mg/dL (ref 70–99)
Glucose-Capillary: 36 mg/dL — CL (ref 70–99)
Glucose-Capillary: 108 mg/dL — ABNORMAL HIGH (ref 70–99)
Glucose-Capillary: 69 mg/dL — ABNORMAL LOW (ref 70–99)

## 2020-03-31 LAB — BASIC METABOLIC PANEL
Anion gap: 8 (ref 5–15)
BUN: 26 mg/dL — ABNORMAL HIGH (ref 8–23)
CO2: 20 mmol/L — ABNORMAL LOW (ref 22–32)
Calcium: 8.3 mg/dL — ABNORMAL LOW (ref 8.9–10.3)
Chloride: 113 mmol/L — ABNORMAL HIGH (ref 98–111)
Creatinine, Ser: 0.63 mg/dL (ref 0.61–1.24)
GFR calc Af Amer: >60 mL/min (ref >60)
GFR calc non Af Amer: >60 mL/min (ref >60)
Glucose, Bld: 98 mg/dL (ref 70–99)
Potassium: 3.8 mmol/L (ref 3.5–5.1)
Sodium: 141 mmol/L (ref 135–145)

## 2020-03-31 NOTE — Progress Notes (Signed)
PROGRESS NOTE    Spencer [name]  WUJ:[id number] DOB: 1961/04/04 DOA: 03/13/2020 PCP: Patient, No Pcp Per  Chief Complaint  Patient presents with  . Hyperglycemia    Brief Narrative:  59 year old male with history of uncontrolled type 2 diabetes mellitus, cocaine use, brain aneurysm, hypertension, GERD recently hospitalized on 4/28 for DKA, MSSA bacteremia and AKI but signed out AMA on 5/5 was brought by EMS with hyperglycemia and encephalopathy.  In the ED was found to be in DKA with high anion gap of 30.  Given aggressive IV hydration and insulin drip with resolution of DKA.  Hospital course prolonged due to presence of recurrent MSSA bacteremia.    Assessment & Plan:   Principal problem MSSA bacteremia, persistent Left AMA during last hospital stay.  Blood culture on admission (5/8) positive for MSSA.  2D echo was unremarkable.  He refused TEE.  Patient was cleared to get TEE by neurosurgery regarding his C-spine fracture.  However after discussion with cardiology with concern for moderate sedation and high risk due to cervical fracture it was decided not to pursue TEE. Repeat blood culture from 5/10 - for further growth.  As per ID recommendation he needs to be treated as inpatient with IV nafcillin for total 6 weeks (until 04/26/2020).  Active problems Type 2 diabetes mellitus, poorly controlled with hyperglycemia A1c of 12.6.  Patient presented with recurrent DKA which resolved with aggressive IV fluids and IV insulin.  CBG currently stable on Lantus and Premeal aspart.  ?  Lack of medical decision making capacity. Evaluated by psychiatry on 5/15 and 5/12 with conflicting documentation regarding his capacity (was deemed competent on 5/12 and noncompetent on 5/15).  Will ask psychiatry to reevaluate and document so that we have a clarity to this situation. Sister involved in care and is  getting legal guardianship.  ?  Myositis of left shoulder Seen on MRI of the left shoulder.  No  tenderness or swelling noted.  MRI of the cervical spine was reordered.  Although did not show any signs of discitis or osteomyelitis on MRI from 5/12.  Ongoing cocaine abuse Urine drug screen positive this admission.  Essential hypertension Stable.  Continue amlodipine and losartan.  Thrombocytosis Possibly reactive.  Stable.  Fall on 5/24 Patient hit his head with mild frontal scalp swelling.  No acute intracranial injury per head CT.  Acute kidney injury Prerenal secondary to dehydration with DKA.  Resolved with fluids.  Intermittent agitation On as needed low-dose Haldol.  Minimally displaced C4 fracture Recommendation to wear Aspen collar at all times per patient refusing.  Per prior neurosurgery evaluation patient was okay to undergo TEE as long as he had the Aspen collar in place.  Outpatient follow-up with neurosurgery Patsey Berthold, NP)  DVT prophylaxis: Subcu Lovenox Code Status: Full code Family Communication: none. Sister involved in care Disposition:   Status is: Inpatient  Remains inpatient appropriate because:Unsafe d/c plan.  Needs IV antibiotics until 6/21   Dispo: The patient is from: Home              Anticipated d/c is to: Home              Anticipated d/c date is: > 3 days              Patient currently is not medically stable to d/c.        Consultants:   ID, psych   Procedures: 2D echo, head CT, MRI left shoulder   Antimicrobials: IV  nafcillin until 6/21   Subjective: Seen and examined.  Reports of having pain everywhere.  No overnight events  Objective: Vitals:   03/31/20 0149 03/31/20 0524 03/31/20 0725 03/31/20 0817  BP: (!) 161/107 (!) 159/98 (!) 162/102 (!) 147/88  Pulse: 92 98 (!) 103 (!) 101  Resp:  18 18   Temp:  97.6 F (36.4 C) 97.8 F (36.6 C)   TempSrc:      SpO2: 97% 96% 97%   Weight:      Height:        Intake/Output Summary (Last 24 hours) at 03/31/2020 1627 Last data filed at 03/31/2020 1417 Gross per 24  hour  Intake 840 ml  Output 300 ml  Net 540 ml   Filed Weights   03/13/20 1654 03/13/20 2055  Weight: 59 kg 60.3 kg    Examination:  General: Not in distress HEENT: Moist mucosa, supple neck Chest: Clear bilaterally CVs: Normal S1-S2 GI: Soft, nondistended, nontender Musculoskeletal: Warm, no edema    Data Reviewed: I have personally reviewed following labs and imaging studies  CBC: Recent Labs  Lab 03/27/20 0441 03/28/20 0404 03/29/20 0607 03/30/20 0344 03/31/20 0413  WBC 12.1* 12.1* 15.5* 9.3 9.8  HGB 8.9* 8.5* 8.8* 9.5* 9.1*  HCT 27.3* 27.2* 26.6* 28.6* 28.3*  MCV 92.2 93.2 92.4 89.9 92.8  PLT 804* 687* 641* 590* 552*    Basic Metabolic Panel: Recent Labs  Lab 03/27/20 0441 03/28/20 0404 03/29/20 0607 03/30/20 0344 03/31/20 0413  NA 137 135 138 139 141  K 3.8 4.3 3.7 4.2 3.8  CL 106 107 109 109 113*  CO2 24 24 22 23  20*  GLUCOSE 106* 288* 202* 193* 98  BUN 26* 26* 28* 34* 26*  CREATININE 0.52* 0.62 0.55* 0.62 0.63  CALCIUM 8.2* 8.0* 8.2* 8.3* 8.3*    GFR: Estimated Creatinine Clearance: 82.7 mL/min (by C-G formula based on SCr of 0.63 mg/dL).  Liver Function Tests: No results for input(s): AST, ALT, ALKPHOS, BILITOT, PROT, ALBUMIN in the last 168 hours.  CBG: Recent Labs  Lab 03/30/20 1129 03/30/20 1632 03/30/20 2122 03/31/20 0727 03/31/20 1159  GLUCAP 120* 124* 167* 76 332*     No results found for this or any previous visit (from the past 240 hour(s)).       Radiology Studies: MR SHOULDER LEFT W WO CONTRAST  Result Date: 03/30/2020 CLINICAL DATA:  Septic arthritis, history of drug abuse EXAM: MRI OF THE LEFT SHOULDER WITHOUT AND WITH CONTRAST TECHNIQUE: Multiplanar, multisequence MR imaging of the shoulder was performed before and after the administration of intravenous contrast. CONTRAST:  61mL GADAVIST GADOBUTROL 1 MMOL/ML IV SOLN COMPARISON:  None. FINDINGS: Limited due to patient motion. Rotator cuff: Increased globular signal  and thickening is seen throughout the supraspinatus subscapularis and infraspinatus tendons. No full-thickness rotator cuff tears are seen. The teres minor tendon is intact. Increased feathery signal seen within the subscapularis, supraspinatus, teres minor, and infraspinatus musculature. Muscles: The muscles other than the rotator cuff are normal without tear, edema, or atrophy. Biceps Long Head: Increased signal and thickening seen within the intra-articular portion of the long head of biceps tendon, however it is intact. Acromioclavicular Joint: Mild AC joint arthrosis is seen with capsular hypertrophy and surrounding marrow edema. Type II acromion. Glenohumeral Joint: The glenohumeral joint alignment is well maintained. Mild glenohumeral joint chondral thinning is seen throughout. Trace joint effusion. Labrum: Somewhat limited evaluation due to patient motion, however superior labral degeneration. No displaced labral tear. Bones: No fracture,  osteonecrosis, or pathologic marrow infiltration. No areas of abnormal enhancement or cortical destruction. Other: The subacromial-subdeltoid bursa is normal without evidence of bursitis. IMPRESSION: Somewhat limited due to patient motion, however no definite evidence of osteomyelitis or septic arthritis. Diffuse increased signal seen throughout the rotator cuff musculature which could be due to myositis. Trace glenohumeral joint effusion Electronically Signed   By: Jonna Clark M.D.   On: 03/30/2020 08:27        Scheduled Meds: . amLODipine  10 mg Oral Daily  . aspirin EC  81 mg Oral Daily  . enoxaparin (LOVENOX) injection  40 mg Subcutaneous Q24H  . famotidine  20 mg Oral Daily  . feeding supplement (GLUCERNA SHAKE)  237 mL Oral BID BM  . insulin aspart  0-15 Units Subcutaneous TID WC  . insulin aspart  0-5 Units Subcutaneous QHS  . insulin aspart  12 Units Subcutaneous TID WC  . insulin glargine  34 Units Subcutaneous Daily  . losartan  50 mg Oral Daily    . methocarbamol  500 mg Oral TID  . multivitamin with minerals  1 tablet Oral Daily  . polyethylene glycol  17 g Oral Daily   Continuous Infusions: . sodium chloride 500 mL (03/27/20 1904)  . nafcillin (NAFCIL) continuous infusion 12 g (03/31/20 0841)     LOS: 18 days    Time spent: 25 minutes    Anniah Glick, MD [facility]   To contact the attending provider between 7A-7P or the covering provider during after hours 7P-7A, please log into the web site [email or web address] and access using universal [place] password for that web site. If you do not have the password, please call the hospital operator.  03/31/2020, 4:27 PM

## 2020-04-01 ENCOUNTER — Inpatient Hospital Stay: Payer: Medicaid Other

## 2020-04-01 DIAGNOSIS — M4620 Osteomyelitis of vertebra, site unspecified: Secondary | ICD-10-CM

## 2020-04-01 DIAGNOSIS — M4622 Osteomyelitis of vertebra, cervical region: Secondary | ICD-10-CM

## 2020-04-01 LAB — GLUCOSE, CAPILLARY
Glucose-Capillary: 184 mg/dL — ABNORMAL HIGH (ref 70–99)
Glucose-Capillary: 147 mg/dL — ABNORMAL HIGH (ref 70–99)
Glucose-Capillary: 189 mg/dL — ABNORMAL HIGH (ref 70–99)
Glucose-Capillary: 83 mg/dL (ref 70–99)
Glucose-Capillary: 257 mg/dL — ABNORMAL HIGH (ref 70–99)
Glucose-Capillary: 378 mg/dL — ABNORMAL HIGH (ref 70–99)

## 2020-04-01 LAB — BLOOD GAS, ARTERIAL
Acid-base deficit: 4.5 mmol/L — ABNORMAL HIGH (ref 0.0–2.0)
Bicarbonate: 21.1 mmol/L (ref 20.0–28.0)
Delivery systems: POSITIVE
Expiratory PAP: 5
FIO2: 0.6
Inspiratory PAP: 10
Mechanical Rate: 12
O2 Saturation: 97.2 %
Patient temperature: 37
RATE: 12 resp/min
pCO2 arterial: 40 mmHg (ref 32.0–48.0)
pH, Arterial: 7.33 — ABNORMAL LOW (ref 7.350–7.450)
pO2, Arterial: 99 mmHg (ref 83.0–108.0)

## 2020-04-01 LAB — COMPREHENSIVE METABOLIC PANEL
ALT: 13 U/L (ref 0–44)
AST: 13 U/L — ABNORMAL LOW (ref 15–41)
Albumin: 2.2 g/dL — ABNORMAL LOW (ref 3.5–5.0)
Alkaline Phosphatase: 86 U/L (ref 38–126)
Anion gap: 6 (ref 5–15)
BUN: 29 mg/dL — ABNORMAL HIGH (ref 8–23)
CO2: 23 mmol/L (ref 22–32)
Calcium: 8.3 mg/dL — ABNORMAL LOW (ref 8.9–10.3)
Chloride: 109 mmol/L (ref 98–111)
Creatinine, Ser: 0.85 mg/dL (ref 0.61–1.24)
GFR calc Af Amer: >60 mL/min (ref >60)
GFR calc non Af Amer: >60 mL/min (ref >60)
Glucose, Bld: 341 mg/dL — ABNORMAL HIGH (ref 70–99)
Potassium: 4.2 mmol/L (ref 3.5–5.1)
Sodium: 138 mmol/L (ref 135–145)
Total Bilirubin: 0.7 mg/dL (ref 0.3–1.2)
Total Protein: 6.5 g/dL (ref 6.5–8.1)

## 2020-04-01 LAB — BRAIN NATRIURETIC PEPTIDE: B Natriuretic Peptide: 759.2 pg/mL — ABNORMAL HIGH (ref 0.0–100.0)

## 2020-04-01 LAB — CBC
HCT: 31.7 % — ABNORMAL LOW (ref 39.0–52.0)
Hemoglobin: 10.1 g/dL — ABNORMAL LOW (ref 13.0–17.0)
MCH: 29.6 pg (ref 26.0–34.0)
MCHC: 31.9 g/dL (ref 30.0–36.0)
MCV: 93 fL (ref 80.0–100.0)
Platelets: 506 10*3/uL — ABNORMAL HIGH (ref 150–400)
RBC: 3.41 MIL/uL — ABNORMAL LOW (ref 4.22–5.81)
RDW: 16.4 % — ABNORMAL HIGH (ref 11.5–15.5)
WBC: 10.4 10*3/uL (ref 4.0–10.5)
nRBC: 0 % (ref 0.0–0.2)

## 2020-04-01 MED ORDER — BUDESONIDE 0.25 MG/2ML IN SUSP
0.2500 mg | Freq: Two times a day (BID) | RESPIRATORY_TRACT | Status: DC
  Administered 2020-04-01 – 2020-04-06 (×10): 0.25 mg via RESPIRATORY_TRACT
  Filled 2020-04-01 (×11): qty 2

## 2020-04-01 MED ORDER — OLANZAPINE 5 MG PO TABS
5.0000 mg | ORAL_TABLET | Freq: Every day | ORAL | Status: DC
  Administered 2020-04-02 – 2020-04-27 (×25): 5 mg via ORAL
  Filled 2020-04-01 (×28): qty 1

## 2020-04-01 MED ORDER — VANCOMYCIN HCL 1500 MG/300ML IV SOLN
1500.0000 mg | Freq: Two times a day (BID) | INTRAVENOUS | Status: DC
  Filled 2020-04-01 (×2): qty 300

## 2020-04-01 MED ORDER — ORAL CARE MOUTH RINSE
15.0000 mL | Freq: Two times a day (BID) | OROMUCOSAL | Status: DC
  Administered 2020-04-02 – 2020-04-26 (×32): 15 mL via OROMUCOSAL

## 2020-04-01 MED ORDER — CLONAZEPAM 0.25 MG PO TBDP
0.2500 mg | ORAL_TABLET | Freq: Two times a day (BID) | ORAL | Status: DC
  Administered 2020-04-01 – 2020-04-04 (×5): 0.25 mg via ORAL
  Filled 2020-04-01 (×2): qty 1
  Filled 2020-04-01: qty 2
  Filled 2020-04-01: qty 1
  Filled 2020-04-01: qty 2
  Filled 2020-04-01 (×2): qty 1

## 2020-04-01 MED ORDER — FUROSEMIDE 10 MG/ML IJ SOLN: 40.0000 mg | Freq: Once | INTRAMUSCULAR | Status: AC

## 2020-04-01 MED ORDER — INSULIN ASPART 100 UNIT/ML ~~LOC~~ SOLN
4.0000 [IU] | Freq: Three times a day (TID) | SUBCUTANEOUS | Status: DC
Start: 2020-04-01 — End: 2020-04-01

## 2020-04-01 MED ORDER — FUROSEMIDE 10 MG/ML IJ SOLN
INTRAMUSCULAR | Status: AC
  Administered 2020-04-01: 40 mg via INTRAVENOUS
  Filled 2020-04-01: qty 4

## 2020-04-01 MED ORDER — HALOPERIDOL 0.5 MG PO TABS
0.2500 mg | ORAL_TABLET | Freq: Two times a day (BID) | ORAL | Status: DC
  Administered 2020-04-02 – 2020-04-28 (×52): 0.25 mg via ORAL
  Filled 2020-04-01 (×57): qty 0.5

## 2020-04-01 MED ORDER — CHLORHEXIDINE GLUCONATE 0.12 % MT SOLN
15.0000 mL | Freq: Two times a day (BID) | OROMUCOSAL | Status: DC
  Administered 2020-04-02 – 2020-04-28 (×42): 15 mL via OROMUCOSAL
  Filled 2020-04-01 (×46): qty 15

## 2020-04-01 MED ORDER — METHYLPREDNISOLONE SODIUM SUCC 125 MG IJ SOLR
60.0000 mg | Freq: Once | INTRAMUSCULAR | Status: AC
  Administered 2020-04-01: 60 mg via INTRAVENOUS
  Filled 2020-04-01: qty 2

## 2020-04-01 MED ORDER — DULOXETINE HCL 30 MG PO CPEP
30.0000 mg | ORAL_CAPSULE | Freq: Every day | ORAL | Status: DC
  Administered 2020-04-01 – 2020-04-28 (×28): 30 mg via ORAL
  Filled 2020-04-01 (×30): qty 1

## 2020-04-01 MED ORDER — INSULIN ASPART 100 UNIT/ML ~~LOC~~ SOLN
8.0000 [IU] | Freq: Three times a day (TID) | SUBCUTANEOUS | Status: DC
  Administered 2020-04-01 – 2020-04-02 (×5): 8 [IU] via SUBCUTANEOUS
  Filled 2020-04-01 (×5): qty 1

## 2020-04-01 MED ORDER — CHLORHEXIDINE GLUCONATE CLOTH 2 % EX PADS
6.0000 | MEDICATED_PAD | Freq: Every day | CUTANEOUS | Status: DC
  Administered 2020-04-02 – 2020-04-14 (×8): 6 via TOPICAL

## 2020-04-01 MED ORDER — IPRATROPIUM-ALBUTEROL 0.5-2.5 (3) MG/3ML IN SOLN
3.0000 mL | RESPIRATORY_TRACT | Status: DC
  Administered 2020-04-02 – 2020-04-03 (×7): 3 mL via RESPIRATORY_TRACT
  Filled 2020-04-01 (×8): qty 3

## 2020-04-01 MED ORDER — GADOBUTROL 1 MMOL/ML IV SOLN
6.0000 mL | Freq: Once | INTRAVENOUS | Status: AC | PRN
  Administered 2020-04-01: 6 mL via INTRAVENOUS

## 2020-04-01 NOTE — Progress Notes (Addendum)
     BRIEF OVERNIGHT PROGRESS REPORT   SUBJECTIVE: Rapid response initiated for acute respiratory distress. Per patient's RN, patient got up to use the bathroom and developed severe SOB associated with hypoxia sats int he 50's, diaphoresis and increased work of breathing.  OBJECTIVE:On arrival to the bedside, he was afebrile with blood pressure 249/138 mm Hg and pulse rate 122 beats/min. There were no focal neurological deficits; he was alert and nodding appropriately on non-rebreathe. Patient noted with increased work of breathing with accessory muscle use, paradoxical movement of the abdomen and supraclavicular and intercostal retraction. Lung sounds with crackles bilaterally  BRIEF PATIENT DESCRIPTION:59 year old male with PMH uncontrolled type 2 DM, cocaine use, brain aneurysm, hypertension, GERD recently hospitalized on 4/28 for DKA, MSSA bacteremia and AKI but signed out AMA on 5/5 readmitted with DKA. Now with acute respiratory failure with hypoxia requiring BiPAP and tranfer to stepdown.  ASSESSMENT: Acute respiratory failure with hypoxia  PLAN:  -Acute Hypoxic  Respiratory Failure secondary to Acute pulmonary edema/Pleural Effusion -Transfer to Stepdown -Supplemental O2 as needed to maintain O2 saturations 88 to 92% -BiPAP, wean as tolerated -High risk for intubation -Follow intermittent ABG and chest x-ray as needed -Repeat CXR on 5/27 shows moderate pulmonary edema with small pleural effusion new from prior xray -IV Lasix 40 mg x 1 administered -As needed bronchodilators -Steroids 60mg  x1 administered -Check labs CBC, BNP, CMP   -Acute Metabolic Encephalopathy - Multifactorial in the setting of multiple medical issues -Provide supportive care       , DNP, CCRN, FNP-C [facility] Nurse Practitioner

## 2020-04-01 NOTE — Progress Notes (Addendum)
Inpatient Diabetes Program Recommendations  AACE/ADA: New Consensus Statement on Inpatient Glycemic Control   Target Ranges:  Prepandial:   less than 140 mg/dL      Peak postprandial:   less than 180 mg/dL (1-2 hours)      Critically ill patients:  140 - 180 mg/dL   Results for CAREL, CARRIER (MRN [id number]) as of 04/01/2020 07:01  Ref. Range 03/31/2020 07:27 03/31/2020 11:59 03/31/2020 16:32 03/31/2020 21:16 03/31/2020 21:34 03/31/2020 21:49 04/01/2020 02:06  Glucose-Capillary Latest Ref Range: 70 - 99 mg/dL 76 409 (H) 75 36 (LL) 69 (L) 108 (H) 184 (H)   Review of Glycemic Control  Outpatient Diabetes medications: Lantus 18 units daily, Novolog 12 units TID with meals Current orders for Inpatient glycemic control: Lantus 34 units daily, Novolog 12 units TID with meals, Novolog 0-15 units TID with meals, Novolog 0-5 units QHS  Inpatient Diabetes Program Recommendations:   Insulin - Meal Coverage: Premeal glucose 75 mg/dl on 7/35/32 at 99:24 and Novolog 12 units for meal coverage was given with supper. Following CBG 36 mg/dl at 26:83. NURSING: Per administration parameters on meal coverage, it should not be given if premeal glucose is less than 80 mg/dl.    Thanks, Orlando Penner, RN, MSN, CDE Diabetes Coordinator Inpatient Diabetes Program [phone] (Team Pager from 8am to 5pm)

## 2020-04-01 NOTE — TOC Progression Note (Signed)
Transition of Care [facility]) - Progression Note    Patient Details  Name: BYRANT VALENT MRN: [id number] Date of Birth: [birth year]  Transition of [facility]) CM/SW Contact  Barrie Dunker, RN Phone Number: 04/01/2020, 9:28 AM  Clinical Narrative:    The patient remains inpatient due to ongoing IV ABX that will need to be done until the end of June.  His sister is attempting to get legal Guardianship, on 5/12 and 5/15 the patient was evaluated by Psych with conflicting documentation, they will be re-seen by Psych to clarify his ability to make decisions and competency.  He will remain in the hospital for IV ABX, due to being Homeless and unable to get the needed ABX outside of the hospital, TOC will continue to follow the patient for any needs PT con tinues to work with the patient to help with mobility   Expected Discharge Plan: Home/Self Care Barriers to Discharge: Continued Medical Work up  Expected Discharge Plan and Services Expected Discharge Plan: Home/Self Care In-house Referral: Clinical Social Work Discharge Planning Services: CM Consult   Living arrangements for the past 2 months: Single Family Home                 DME Arranged: Walker rolling DME Agency: [employer] Date DME Agency Contacted: 03/18/20 Time DME Agency Contacted: 1100 Representative spoke with at DME Agency: Zack             Social Determinants of Health (SDOH) Interventions    Readmission Risk Interventions Readmission Risk Prevention Plan 03/05/2020 02/16/2020 12/17/2019  Transportation Screening Complete Complete Complete  PCP or Specialist Appt within 3-5 Days - - -  HRI or Home Care Consult - - -  HRI or Home Care Consult comments - - -  Palliative Care Screening - - -  Medication Review (RN Care Manager) Complete Complete Complete  PCP or Specialist appointment within 3-5 days of discharge Patient refused Complete Patient refused  HRI or Home Care Consult Complete Complete Patient refused  SW  Recovery Care/Counseling Consult - - -  SW Consult Not Complete Comments - - -  Palliative Care Screening - - Not Applicable  Skilled Nursing Facility Complete Complete Not Applicable  Some recent data might be hidden

## 2020-04-01 NOTE — Progress Notes (Signed)
Pt went back to bed after using BSC and began having severe SOB. O2 sats decreased in to the 50s. Pt was using accessory muscles to breath and was very diaphoretic.NRB placed. RRT and Provider called to bedside. Pt then transferred to ICU. Bedside Report given to Thurston Hole, RN.

## 2020-04-01 NOTE — Consult Note (Signed)
[facility] Face-to-Face Psychiatry Consult   Reason for Consult:    Psychiatric issues personality problems, adjustment issues and capacity issues    Referring Physician: Hospital Team     Patient Identification: Spencer [name] MRN:  [id number] Principal Diagnosis: MSSA bacteremia Diagnosis:  Principal Problem:   MSSA bacteremia Active Problems:   DKA (diabetic ketoacidoses) (HCC)   Hypertension   Diabetic neuropathy (HCC)   DM2 (diabetes mellitus, type 2) (HCC)   History of cocaine abuse (HCC)   Personal history of subdural hematoma   AKI (acute kidney injury) (HCC)   GERD (gastroesophageal reflux disease)   C4 cervical fracture (HCC)   Cigarette smoker   Pressure injury of skin   Opioid dependence Depression, Anxiety due to medical illness Generalized Anxiety   Personality disorder NOS --antisocial, borderline, dependent narcissistic traits  Recent metabolic encephalopathy     Total Time spent with patient:  One hour plus  to one hour  With sister in conference    Subjective:     Spencer [name] is a 59 y.o. male patient admitted with above issues.  Is admitted with above medical problems.  Course has waxed and waned with multiple personality problems, adjustment issues mood and anxiety due to illness, addictive personality problems ---question of capacity vs. General behaviors and whether he can leave AMA and to help manage his mood and behaviors      HPI:    Caucasian male previously consulted for Psychiatry.  Complex issues of dual diagnosis with issues of depression, anxiety, opioid dependence and personality disorder.  Enmeshed family/sister who is trying to obtain guardianship but has not so far been successful in court.  Hospitalist and team concern about capacity for consent, medications, and general psych behavioral management .  Patient when frustrated, angry, edgy irritable and or craving substances will interrupt care and leave AMA.  Then has pattern of  returning which happened recently where he was found in severe DKA and encephalopathy ----relatively clearing he still needs long course of antibiotics for MSSA bacteremia.   He has depressed mood, crying spells, hopeless, helpless feelings, lack of energy, enthusiasm, motivation, lack of sleep, concentration and attention, lack of Self esteem ---and worth, guilty feelings, without active SI HI or plans.  He has mood swings, ups and downs, lability, highs and lows, irritability, edginess, frustration, shouting spells, IED symptoms when ill or in pain and or without.   He has severe generalized anxiety with anxious mood, nervousness, tension frustration, frozen and numb feelings, somatic and anxious and autonomic symptoms, fear,dread, doom and gloom and sudden panic,    His baseline issues are all made worse by chronic illness   Sister describes severe personality and addictive issues where patient uses all his monthly disability check on drugs (opiates, methamphetamines, cocaine alcohol ---suspected needle heroin use )---wanders streets and at times lives with her and Dad.  No previous recovery recently or psych meds.  Has poor judgement insight and reliability due to substance and personality problems.   He does not care for Self due to influence of drugs when not in hospital or is in process of withdrawal as well.   He has not had any regular AA NA or related programming in recent times.   Has not had any formal psych hospitalizations or regular medicines.   She has noticed fluctuant consciousness this admission where he thought he called a basketball team and where he thought the barbecue grill was in his room ----she feels he can be confused  and hallucinate -when he has poor glucose control..   She is pursuing power of attorney rather than full guardianship --latest update as she does not want to be liable for full guardianship ----  She will go to court to get the parameters for at least  power of attorney ---for finances  Patient with me is today relatively cooperative   He is somewhat sleepy, but says he has trouble staying asleep --appearance--looks somewhat haggard, forlorn   He is oriented to person place most of date and time.   He has some reports of residual fluctuant consciousness from previous DKA and metabolic encephalopathy issues   He has poor concentration and attention  He complains of chronic back pain   His mood is irritable edgy frustrated anxious and depressed   His affect is somewhat blunted and flat  Speech --low tone volume rate ///fluency okay   Thought process --coherent for now --logical at present --but sometimes can get disorganized ----no frank LOA FOI ---per se   Thought content --without frank paranoia, auditory hallucinations but has had some visual hallucinations and confused state   Fund of knowledge and intelligence --previously higher now going downhill over several years   SI and HI --no active SI or HI at this time contracts for safety   Memory --limited due to cooperation.  Remote intact, recent ---at present intact through general questions, immediate also intact --however will recheck when cooperative   Abstraction --he is able to abstract on various topics and proverbs  Rapport --fair to poor ---he answers --rapport attempting to be built from my side   Eye contact on and off as he is somewhat resting while answering questions   Judgement insight reliability   Reliability is poor  Judgement is fair to poor  Insight is fair to poor   However patient does understand the nature of his medical problems  He does understand that leaving AMA is harmful to his overall health and he knows that without treatment he would clinically deteriorate.  Patient does realize that diagnostic interventions are for his medical problems and understands what they are for.     His poor judgement insight and reliability are more from severe  personality problems, dual diagnosis and addictive personality behaviors that we cannot IVC him for at this time.     Secondly ---he is not imminently suicidal or homicidal to file IVC  "risk of clinical deterioration if discharged" is a controversial area --where patient must have more immediate and emergent symptoms that would warrant emergency decision from judge---which he does not have at this time.   Past Psychiatric History:   History from sister ----mainly ---where he has not had regular psychiatry medications or treatment, has had no recent rehab or detox, mainly is street bound.   Court and legal ---multiple jail time and charges for drugs, theft and all --court case pending now again for theft since [place] in jail jan ---2021     Risk to Self:  currently no active SI HI or plans contracts for safety  Risk to Others:   none  Prior Inpatient Therapy:  none recently  Prior Outpatient Therapy:   None recently  Past Medical History:  Past Medical History:  Diagnosis Date  . Brain aneurysm   . Broken bones    clavical, ankle, arms toes wriast   . Diabetes mellitus without complication ([place])   . Dysrhythmia   . GERD (gastroesophageal reflux disease)   . Hypertension   .  Substance abuse [facility](HCC)     Past Surgical History:  Procedure Laterality Date  . CORONARY/GRAFT ACUTE MI REVASCULARIZATION N/A 09/04/2019   Procedure: Coronary/Graft Acute MI Revascularization;  Surgeon: Alwyn Peaallwood, Dwayne D, MD;  Location: [facility] INVASIVE CV LAB;  Service: Cardiovascular;  Laterality: N/A;  . ESOPHAGOGASTRODUODENOSCOPY (EGD) WITH PROPOFOL N/A 06/04/2019   Procedure: ESOPHAGOGASTRODUODENOSCOPY (EGD) WITH PROPOFOL;  Surgeon: Toledo, Boykin Nearingeodoro K, MD;  Location: [facility] ENDOSCOPY;  Service: Gastroenterology;  Laterality: N/A;  . HERNIA REPAIR    . IMPLANTATION / PLACEMENT OF STRIP ELECTRODES VIA BURR HOLES SUBDURAL     anyrusum  . LEFT HEART CATH AND CORONARY ANGIOGRAPHY N/A 09/04/2019   Procedure:  LEFT HEART CATH AND CORONARY ANGIOGRAPHY;  Surgeon: Alwyn Peaallwood, Dwayne D, MD;  Location: [facility] INVASIVE CV LAB;  Service: Cardiovascular;  Laterality: N/A;  . NECK SURGERY    . TEE WITHOUT CARDIOVERSION N/A 06/03/2019   Procedure: TRANSESOPHAGEAL ECHOCARDIOGRAM (TEE);  Surgeon: Dalia HeadingFath, Kenneth A, MD;  Location: [facility] ORS;  Service: Cardiovascular;  Laterality: N/A;   Family History:  Family History  Problem Relation Age of Onset  . CAD Sister   . Hypertension Sister   . Healthy Mother   . Healthy Father    Family Psychiatric  History:   Heavy family history --Dad with severe alcoholism, Mom with bipolar disorder and anxiety sister and brother with drug dependence (sister now sober --main contact)----estranged from his three kids years ago ---  Divorced on disability 800 per month for chronic back pain and other related issues   Homeless technically has medicaid   No community mental health contact    Social History:  As above Social History   Substance and Sexual Activity  Alcohol Use Yes  . Alcohol/week: 1.0 standard drinks  . Types: 1 Cans of beer per week     Social History   Substance and Sexual Activity  Drug Use Not Currently  . Types: Cocaine    Social History   Socioeconomic History  . Marital status: Single    Spouse name: Not on file  . Number of children: Not on file  . Years of education: Not on file  . Highest education level: Not on file  Occupational History  . Not on file  Tobacco Use  . Smoking status: Current Some Day Smoker  . Smokeless tobacco: Never Used  Substance and Sexual Activity  . Alcohol use: Yes    Alcohol/week: 1.0 standard drinks    Types: 1 Cans of beer per week  . Drug use: Not Currently    Types: Cocaine  . Sexual activity: Not on file  Other Topics Concern  . Not on file  Social History Narrative   ** Merged History Encounter **       Social Determinants of Health   Financial Resource Strain:   . Difficulty of Paying  Living Expenses:   Food Insecurity:   . Worried About Programme researcher, broadcasting/film/videounning Out of Food in the Last Year:   . Baristaan Out of Food in the Last Year:   Transportation Needs:   . Freight forwarderLack of Transportation (Medical):   Marland Kitchen. Lack of Transportation (Non-Medical):   Physical Activity:   . Days of Exercise per Week:   . Minutes of Exercise per Session:   Stress:   . Feeling of Stress :   Social Connections:   . Frequency of Communication with Friends and Family:   . Frequency of Social Gatherings with Friends and Family:   . Attends Religious Services:   .  Active Member of Clubs or Organizations:   . Attends Banker Meetings:   Marland Kitchen Marital Status:    Additional Social History:    Allergies:  No Known Allergies  Labs:  Results for orders placed or performed during the hospital encounter of 03/13/20 (from the past 48 hour(s))  Glucose, capillary     Status: Abnormal   Collection Time: 03/30/20 11:29 AM  Result Value Ref Range   Glucose-Capillary 120 (H) 70 - 99 mg/dL    Comment: Glucose reference range applies only to samples taken after fasting for at least 8 hours.   Comment 1 Notify RN    Comment 2 Document in Chart   Glucose, capillary     Status: Abnormal   Collection Time: 03/30/20  4:32 PM  Result Value Ref Range   Glucose-Capillary 124 (H) 70 - 99 mg/dL    Comment: Glucose reference range applies only to samples taken after fasting for at least 8 hours.   Comment 1 Notify RN    Comment 2 Document in Chart   Glucose, capillary     Status: Abnormal   Collection Time: 03/30/20  9:22 PM  Result Value Ref Range   Glucose-Capillary 167 (H) 70 - 99 mg/dL    Comment: Glucose reference range applies only to samples taken after fasting for at least 8 hours.  CBC     Status: Abnormal   Collection Time: 03/31/20  4:13 AM  Result Value Ref Range   WBC 9.8 4.0 - 10.5 K/uL   RBC 3.05 (L) 4.22 - 5.81 MIL/uL   Hemoglobin 9.1 (L) 13.0 - 17.0 g/dL   HCT 16.1 (L) 09.6 - 04.5 %   MCV 92.8 80.0 -  100.0 fL   MCH 29.8 26.0 - 34.0 pg   MCHC 32.2 30.0 - 36.0 g/dL   RDW 40.9 (H) 81.1 - 91.4 %   Platelets 552 (H) 150 - 400 K/uL   nRBC 0.0 0.0 - 0.2 %    Comment: Performed at [facility], [address] [place], [place] [address]  Basic metabolic panel     Status: Abnormal   Collection Time: 03/31/20  4:13 AM  Result Value Ref Range   Sodium 141 135 - 145 mmol/L   Potassium 3.8 3.5 - 5.1 mmol/L   Chloride 113 (H) 98 - 111 mmol/L   CO2 20 (L) 22 - 32 mmol/L   Glucose, Bld 98 70 - 99 mg/dL    Comment: Glucose reference range applies only to samples taken after fasting for at least 8 hours.   BUN 26 (H) 8 - 23 mg/dL   Creatinine, Ser 6.21 0.61 - 1.24 mg/dL   Calcium 8.3 (L) 8.9 - 10.3 mg/dL   GFR calc non Af Amer >60 >60 mL/min   GFR calc Af Amer >60 >60 mL/min   Anion gap 8 5 - 15    Comment: Performed at [facility], [address] [place], [place] [address]  Glucose, capillary     Status: None   Collection Time: 03/31/20  7:27 AM  Result Value Ref Range   Glucose-Capillary 76 70 - 99 mg/dL    Comment: Glucose reference range applies only to samples taken after fasting for at least 8 hours.  Glucose, capillary     Status: Abnormal   Collection Time: 03/31/20 11:59 AM  Result Value Ref Range   Glucose-Capillary 332 (H) 70 - 99 mg/dL    Comment: Glucose reference range applies only to samples  taken after fasting for at least 8 hours.   Comment 1 Notify RN    Comment 2 Document in Chart   Glucose, capillary     Status: None   Collection Time: 03/31/20  4:32 PM  Result Value Ref Range   Glucose-Capillary 75 70 - 99 mg/dL    Comment: Glucose reference range applies only to samples taken after fasting for at least 8 hours.   Comment 1 Notify RN    Comment 2 Document in Chart   Glucose, capillary     Status: Abnormal   Collection Time: 03/31/20  9:16 PM  Result Value Ref Range   Glucose-Capillary 36 (LL) 70 - 99 mg/dL    Comment: Glucose reference range  applies only to samples taken after fasting for at least 8 hours.   Comment 1 Notify RN   Glucose, capillary     Status: Abnormal   Collection Time: 03/31/20  9:34 PM  Result Value Ref Range   Glucose-Capillary 69 (L) 70 - 99 mg/dL    Comment: Glucose reference range applies only to samples taken after fasting for at least 8 hours.   Comment 1 Notify RN   Glucose, capillary     Status: Abnormal   Collection Time: 03/31/20  9:49 PM  Result Value Ref Range   Glucose-Capillary 108 (H) 70 - 99 mg/dL    Comment: Glucose reference range applies only to samples taken after fasting for at least 8 hours.   Comment 1 Notify RN   Glucose, capillary     Status: Abnormal   Collection Time: 04/01/20  2:06 AM  Result Value Ref Range   Glucose-Capillary 184 (H) 70 - 99 mg/dL    Comment: Glucose reference range applies only to samples taken after fasting for at least 8 hours.   Comment 1 Notify RN   Glucose, capillary     Status: Abnormal   Collection Time: 04/01/20  8:07 AM  Result Value Ref Range   Glucose-Capillary 147 (H) 70 - 99 mg/dL    Comment: Glucose reference range applies only to samples taken after fasting for at least 8 hours.    Current Facility-Administered Medications  Medication Dose Route Frequency Provider Last Rate Last Admin  . 0.9 %  sodium chloride infusion   Intravenous PRN Lolita Patella B, MD 10 mL/hr at 03/27/20 1904 500 mL at 03/27/20 1904  . acetaminophen (TYLENOL) tablet 650 mg  650 mg Oral Q6H PRN Lolita Patella B, MD   650 mg at 03/26/20 0353  . amLODipine (NORVASC) tablet 10 mg  10 mg Oral Daily Rawla, Claudie Revering, MD   10 mg at 04/01/20 0903  . aspirin EC tablet 81 mg  81 mg Oral Daily Rawla, Prashanth, MD   81 mg at 04/01/20 0856  . bisacodyl (DULCOLAX) EC tablet 5 mg  5 mg Oral Daily PRN Gillis Santa, MD      . bisacodyl (DULCOLAX) suppository 10 mg  10 mg Rectal Daily PRN Gillis Santa, MD      . dextrose 50 % solution 0-50 mL  0-50 mL Intravenous PRN  Rawla, Prashanth, MD      . enoxaparin (LOVENOX) injection 40 mg  40 mg Subcutaneous Q24H Rawla, Prashanth, MD   40 mg at 03/31/20 2148  . famotidine (PEPCID) tablet 20 mg  20 mg Oral Daily Lolita Patella B, MD   20 mg at 03/31/20 2149  . feeding supplement (GLUCERNA SHAKE) (GLUCERNA SHAKE) liquid 237 mL  237 mL Oral BID BM  Charise Killian, MD   237 mL at 04/01/20 0858  . haloperidol (HALDOL) tablet 2 mg  2 mg Oral Q6H PRN Gillis Santa, MD   2 mg at 03/29/20 0201  . hydrALAZINE (APRESOLINE) injection 10 mg  10 mg Intravenous Q6H PRN Charise Killian, MD   10 mg at 04/01/20 0900  . insulin aspart (novoLOG) injection 0-15 Units  0-15 Units Subcutaneous TID WC Lolita Patella B, MD   2 Units at 04/01/20 0859  . insulin aspart (novoLOG) injection 0-5 Units  0-5 Units Subcutaneous QHS Lolita Patella B, MD   3 Units at 03/27/20 2249  . insulin aspart (novoLOG) injection 8 Units  8 Units Subcutaneous TID WC Dhungel, Nishant, MD   8 Units at 04/01/20 0859  . insulin glargine (LANTUS) injection 34 Units  34 Units Subcutaneous Daily Charise Killian, MD   34 Units at 04/01/20 0900  . ipratropium-albuterol (DUONEB) 0.5-2.5 (3) MG/3ML nebulizer solution 3 mL  3 mL Nebulization Q4H PRN [name], NP   3 mL at 03/30/20 0506  . ketorolac (TORADOL) 15 MG/ML injection 15 mg  15 mg Intravenous Q6H PRN [name], NP   15 mg at 03/30/20 0026  . losartan (COZAAR) tablet 50 mg  50 mg Oral Daily Lolita Patella B, MD   50 mg at 04/01/20 0857  . methocarbamol (ROBAXIN) tablet 500 mg  500 mg Oral TID Lolita Patella B, MD   500 mg at 04/01/20 0857  . multivitamin with minerals tablet 1 tablet  1 tablet Oral Daily Lolita Patella B, MD   1 tablet at 04/01/20 0857  . nafcillin 12 g in sodium chloride 0.9 % 500 mL continuous infusion  12 g Intravenous Q24H Charise Killian, MD 20.8 mL/hr at 03/31/20 0841 12 g at 03/31/20 0841  . polyethylene glycol (MIRALAX / GLYCOLAX) packet 17 g  17 g  Oral Daily Gillis Santa, MD   17 g at 04/01/20 0858  . sodium chloride flush (NS) 0.9 % injection 10-40 mL  10-40 mL Intracatheter PRN Charise Killian, MD      . traMADol Janean Sark) tablet 50 mg  50 mg Oral Q6H PRN Charise Killian, MD   50 mg at 04/01/20 3810    Musculoskeletal: Strength & Muscle Tone:  Without change    Gait & Station: unsteady, post fall a few days ago CT negative  Patient leans:  N/a   Psychiatric Specialty Exam: Physical Exam  Review of Systems  Blood pressure (!) 174/105, pulse 99, temperature 97.8 F (36.6 C), temperature source Oral, resp. rate 18, height 5\' 10"  (1.778 m), weight 60.3 kg, SpO2 97 %.Body mass index is 19.07 kg/m.                                                         Treatment Plan Summary:   Caucasian male with complex issues see above, willing to take psych meds for mood, anxiety and sleep at least as an inpatient   Bipolar mixed disorder  Cocaine, Methamphetamine, Opiate and Alcohol dependence  Depression and anxiety due to chronic illness   Personality disorder NOS -- see above   Post residual delirium --from metabolic encephalopathy     Disposition:   Patient remains in house for at least one month   At this time  we cannot IVC him as he does not meet criteria   He has capacity in that he understands the nature of his illness and problems and the implications if he refuses care and decides to leave   There is discussion in that capacity for consent and treatment is different from the ramifications of addictions and its related behaviors and problems.    Unless there is an emergent or life threatening process we cannot keep him from leaving   However if were the case to be emergent---judge has to give an emergency ruling for this   Sister was supported today and encouraged to practice recovery and [place] and to realize that patient has to keep in revolving door and that she cannot stop this  aspect   However he is willing to take psych meds and understand the meds and why he will take them as well as the risks and benefits\  He has has not had regular dosing over years for the above problems   Zyprexa 5 mg started qam  Along with Cymbalta ---30 qam daily for anxiety, pain, and depression   These meds along with small haldol dose will help clear his residual delirium symptoms as well.   Low dose Klonopin can be used despite substance issues while he is inpatient to keep him calm enough for treatment --benefit outweighs risk here   Later can be tapered before discharge if he does stay   Will follow along --complex case ---meeting with sister again pending         Roselind Messier, MD 04/01/2020 10:06 AM

## 2020-04-01 NOTE — Progress Notes (Signed)
Rapid Response Event Note  Overview: pt lying in  Bed NRB on patient using abd muscles to breathe      Initial Focused Assessment:pt has crackles noted throughout lungs, hypertensive , and diaphoretic. Pt is awake and alert    Interventions:Ouma NP at bedside . Pt transferred to stepdown for lasix and BiPAP.  Plan of Care (if not transferred): transferred to rm 17  Event Summary:   at  [id number]    at          [facility]

## 2020-04-01 NOTE — Progress Notes (Signed)
PROGRESS NOTE    DRELYN PISTILLI  ZOX:[id number] DOB: June 02, 1961 DOA: 03/13/2020 PCP: Patient, No Pcp Per  Chief Complaint  Patient presents with  . Hyperglycemia    Brief Narrative:  59 year old male with history of uncontrolled type 2 diabetes mellitus, cocaine use, brain aneurysm, hypertension, GERD recently hospitalized on 4/28 for DKA, MSSA bacteremia and AKI but signed out AMA on 5/5 was brought by EMS with hyperglycemia and encephalopathy.  In the ED was found to be in DKA with high anion gap of 30.  Given aggressive IV hydration and insulin drip with resolution of DKA.  Hospital course prolonged due to presence of recurrent MSSA bacteremia.    Assessment & Plan:   Principal problem MSSA bacteremia, persistent Left AMA during last hospital stay.  Blood culture on admission (5/8) positive for MSSA.  2D echo was unremarkable.  He refused TEE.  Patient was cleared to get TEE by neurosurgery regarding his C-spine fracture.  However after discussion with cardiology with concern for moderate sedation and high risk due to cervical fracture it was decided not to pursue TEE. Repeat blood culture from 5/10 - for further growth.  As per ID recommendation he needs to be treated as inpatient with IV nafcillin for total 6 weeks (until 04/26/2020).  Active problems Type 2 diabetes mellitus, poorly controlled with hyperglycemia and hypoglycemia A1c of 12.6.  Patient presented with recurrent DKA which resolved with aggressive IV fluids and IV insulin.  On Lantus and Premeal aspart (12 units 3 times daily).  He had episodes of hypoglycemia with CBG dropped to 30s yesterday.  Received Premeal aspart although CBG was in the 70s.  Have reduced Premeal aspart dose and instructed the nurse on parameters to administer insulin.    ?  Osteomyelitis of cervical and thoracic spine. MRI of the cervical spine done with concern for myositis seen on MRI of the left shoulder.  Showed progressive perivertebral soft  tissue swelling with concern for osteomyelitis of C1-C5, T2 and T3 vertebral bodies.  Also commenced on ventral epidural phlegmon extending from C2-C5 and dorsal epidural phlegmon extending from C3-C4 through C6-C7.  No discrete epidural abscess, no high-grade spinal stenosis or cord signal abnormality. There is also an acute to subacute mild T3 superior endplate compression fracture. I will consult ID for evaluation and further antibiotic recommendation.  We will add empiric IV vancomycin in the interim.   Ongoing cocaine abuse Urine drug screen positive this admission.  Essential hypertension Stable.  Continue amlodipine and losartan.  Thrombocytosis Possibly reactive.  Stable.  Fall on 5/24 Patient hit his head with mild frontal scalp swelling.  No acute intracranial injury per head CT.  Acute kidney injury Prerenal secondary to dehydration with DKA.  Resolved with fluids.  Intermittent agitation On as needed low-dose Haldol.  Minimally displaced C4 fracture Recommendation to wear Aspen collar at all times per patient refusing.  Per prior neurosurgery evaluation patient was okay to undergo TEE as long as he had the Aspen collar in place.  Outpatient follow-up with neurosurgery Patsey Berthold, NP)  Issues with decision-making capacity Patient has been evaluated by psychiatry on 3 occasions this admission 5/12, 5/15 and 5/27 with latest evaluation today recommending that patient has capacity to make complex medical decisions and understands the nature of his illness.  Patient cannot be involuntarily committed if he decides to leave AGAINST MEDICAL ADVICE.   DVT prophylaxis: Subcu Lovenox Code Status: Full code Family Communication: none. Sister involved in care Disposition:   Status  is: Inpatient  Remains inpatient appropriate because:Unsafe d/c plan.  Needs IV antibiotics until 6/21   Dispo: The patient is from: Home              Anticipated d/c is to: Home               Anticipated d/c date is: > 3 days              Patient currently is not medically stable to d/c.        Consultants:   ID, psych   Procedures: 2D echo, head CT, MRI left shoulder   Antimicrobials: IV nafcillin until 6/21   Subjective: Seen and examined.  Episodes of hypoglycemia yesterday.  Denies any pain this morning. Objective: Vitals:   04/01/20 0318 04/01/20 0812 04/01/20 1212 04/01/20 1642  BP: (!) 172/98 (!) 174/105 (!) 156/97 (!) 152/107  Pulse: 100 99 (!) 105 100  Resp: 16 18 18 18   Temp: 98.4 F (36.9 C) 97.8 F (36.6 C) 98.1 F (36.7 C) 97.8 F (36.6 C)  TempSrc:  Oral Oral   SpO2: 94% 97% 98% 98%  Weight:      Height:        Intake/Output Summary (Last 24 hours) at 04/01/2020 1702 Last data filed at 04/01/2020 1230 Gross per 24 hour  Intake 720 ml  Output 1550 ml  Net -830 ml   Filed Weights   03/13/20 1654 03/13/20 2055  Weight: 59 kg 60.3 kg   Physical exam Not in distress HEENT: Moist, supple neck Chest: Clear CVs: Normal S1-S2 GI: Soft, nontender, nondistended Musculoskeletal: Warm, no edema     Data Reviewed: I have personally reviewed following labs and imaging studies  CBC: Recent Labs  Lab 03/27/20 0441 03/28/20 0404 03/29/20 0607 03/30/20 0344 03/31/20 0413  WBC 12.1* 12.1* 15.5* 9.3 9.8  HGB 8.9* 8.5* 8.8* 9.5* 9.1*  HCT 27.3* 27.2* 26.6* 28.6* 28.3*  MCV 92.2 93.2 92.4 89.9 92.8  PLT 804* 687* 641* 590* 552*    Basic Metabolic Panel: Recent Labs  Lab 03/27/20 0441 03/28/20 0404 03/29/20 0607 03/30/20 0344 03/31/20 0413  NA 137 135 138 139 141  K 3.8 4.3 3.7 4.2 3.8  CL 106 107 109 109 113*  CO2 24 24 22 23  20*  GLUCOSE 106* 288* 202* 193* 98  BUN 26* 26* 28* 34* 26*  CREATININE 0.52* 0.62 0.55* 0.62 0.63  CALCIUM 8.2* 8.0* 8.2* 8.3* 8.3*    GFR: Estimated Creatinine Clearance: 82.7 mL/min (by C-G formula based on SCr of 0.63 mg/dL).  Liver Function Tests: No results for input(s): AST, ALT, ALKPHOS,  BILITOT, PROT, ALBUMIN in the last 168 hours.  CBG: Recent Labs  Lab 03/31/20 2149 04/01/20 0206 04/01/20 0807 04/01/20 1205 04/01/20 1639  GLUCAP 108* 184* 147* 189* 83     No results found for this or any previous visit (from the past 240 hour(s)).       Radiology Studies: MR CERVICAL SPINE W WO CONTRAST  Result Date: 04/01/2020 CLINICAL DATA:  Neck infection.  History of MSSA bacteremia. EXAM: MRI CERVICAL SPINE WITHOUT AND WITH CONTRAST TECHNIQUE: Multiplanar and multiecho pulse sequences of the cervical spine, to include the craniocervical junction and cervicothoracic junction, were obtained without and with intravenous contrast. CONTRAST:  52mL GADAVIST GADOBUTROL 1 MMOL/ML IV SOLN COMPARISON:  MRI cervical spine dated Mar 17, 2020. CT cervical spine dated March 05, 2020. FINDINGS: Alignment: Unchanged reversal of the normal cervical lordosis. Vertebrae: Prominently decreased T1 marrow  signal with increased STIR signal and enhancement involving the anterior arch of C1, dens, C4 vertebral body, C5 superior endplate, and T2 and T3 vertebral bodies. No disc space fluid or abnormal enhancement. Fracture of the T3 superior endplate with minimal compression. Small fracture of the left anterior margin of the C4 vertebral body better evaluated on prior CT. The degree of edema and decreased T1 marrow signal within the C4 and T3 vertebral bodies is greater than expected to be only related to the fractures. Prior C5-C7 ACDF.  No focal bone lesion. Cord: Normal signal. Ventral epidural thickening and enhancement extending from C2 to C4-C5. Dorsal epidural thickening and enhancement extending from C3-C4 through C6-C7. No discrete abscess. Posterior Fossa, vertebral arteries, paraspinal tissues: Progressive prevertebral soft tissue swelling extending from the base of the clivus to C6, currently measuring up to 1.9 cm, previously 1.0 cm. No discrete abscess. Disc levels: C2-C3: No significant disc bulge  or herniation. Moderate bilateral facet arthropathy. Moderate left neuroforaminal stenosis. No spinal canal or right neuroforaminal stenosis C3-C4: No significant disc bulge or herniation. Moderate left and mild right facet arthropathy. Mild spinal canal stenosis. Severe left and moderate right neuroforaminal stenosis. C4-C5: No significant disc bulge or herniation. Mild spinal canal stenosis. Severe right and moderate left facet arthropathy. Severe bilateral neuroforaminal stenosis. C5-C6: Prior ACDF. Residual moderate to severe bilateral neuroforaminal stenosis due to bony hypertrophy. No spinal canal stenosis. C6-C7: Prior ACDF. Residual severe left and moderate right neuroforaminal stenosis due to bony hypertrophy. No spinal canal stenosis. C7-T1: Negative disc. Moderate bilateral facet arthropathy. Moderate bilateral neuroforaminal stenosis. No spinal canal stenosis. IMPRESSION: 1. Progressive prevertebral soft tissue swelling extending from the base of the clivus to C6 concerning for infection. Abnormal marrow signal concerning for osteomyelitis of the C1, C2, C4, C5, T2, and T3 vertebral bodies. 2. Ventral epidural phlegmon extending from C2-C4 to C5. Dorsal epidural phlegmon extending from C3-C4 through C6-C7. No discrete epidural abscess. No high grade spinal canal stenosis or cord signal abnormality. 3. Acute to subacute mild T3 superior endplate compression fracture. Small C4 vertebral body fracture is better evaluated on prior CT. 4. Prior C5-C7 ACDF. Residual moderate to severe bilateral neuroforaminal stenosis at these levels. Electronically Signed   By: Titus Dubin M.D.   On: 04/01/2020 16:46        Scheduled Meds: . amLODipine  10 mg Oral Daily  . aspirin EC  81 mg Oral Daily  . clonazePAM  0.25 mg Oral BID  . DULoxetine  30 mg Oral Daily  . enoxaparin (LOVENOX) injection  40 mg Subcutaneous Q24H  . famotidine  20 mg Oral Daily  . feeding supplement (GLUCERNA SHAKE)  237 mL Oral BID  BM  . haloperidol  0.25 mg Oral BID  . insulin aspart  0-15 Units Subcutaneous TID WC  . insulin aspart  0-5 Units Subcutaneous QHS  . insulin aspart  8 Units Subcutaneous TID WC  . insulin glargine  34 Units Subcutaneous Daily  . losartan  50 mg Oral Daily  . methocarbamol  500 mg Oral TID  . multivitamin with minerals  1 tablet Oral Daily  . OLANZapine  5 mg Oral QHS  . polyethylene glycol  17 g Oral Daily   Continuous Infusions: . sodium chloride 500 mL (03/27/20 1904)  . nafcillin (NAFCIL) continuous infusion 20.8 mL/hr at 04/01/20 1200     LOS: 19 days    Time spent: 25 minutes    Dorien Mayotte, MD [facility]   To  contact the attending provider between 7A-7P or the covering provider during after hours 7P-7A, please log into the web site [email or web address] and access using universal [place] password for that web site. If you do not have the password, please call the hospital operator.  04/01/2020, 5:02 PM

## 2020-04-01 NOTE — Consult Note (Signed)
Pharmacy Antibiotic Note  Spencer [name] is a 59 y.o. male admitted on 03/13/2020 with Osteomyelitis.  Pharmacy has been consulted for vancomycin dosing. Hx of MSSA bacteremia on 5/8.   Plan: Will order vancomycin 1500 mg q12H for a predicted trough of 15. Plan to order vancomycin trough in 2-3 days.    Height: 5\' 10"  (177.8 cm) Weight: 60.3 kg (132 lb 15 oz) IBW/kg (Calculated) : 73  Temp (24hrs), Avg:98.2 F (36.8 C), Min:97.8 F (36.6 C), Max:98.8 F (37.1 C)  Recent Labs  Lab 03/27/20 0441 03/28/20 0404 03/29/20 0607 03/30/20 0344 03/31/20 0413  WBC 12.1* 12.1* 15.5* 9.3 9.8  CREATININE 0.52* 0.62 0.55* 0.62 0.63    Estimated Creatinine Clearance: 82.7 mL/min (by C-G formula based on SCr of 0.63 mg/dL).    No Known Allergies   Thank you for allowing pharmacy to be a part of this patient's care.  04/02/20, PharmD, BCPS 04/01/2020 5:19 PM

## 2020-04-01 NOTE — Progress Notes (Addendum)
ID Patient in bed. Does not say much   Patient Vitals for the past 24 hrs:  BP Temp Temp src Pulse Resp SpO2  04/01/20 1212 (!) 156/97 98.1 F (36.7 C) Oral (!) 105 18 98 %  04/01/20 0812 (!) 174/105 97.8 F (36.6 C) Oral 99 18 97 %  04/01/20 0318 (!) 172/98 98.4 F (36.9 C) -- 100 16 94 %  04/01/20 0123 (!) 177/112 98.1 F (36.7 C) Oral 98 16 94 %  03/31/20 2004 (!) 162/99 98.8 F (37.1 C) Axillary 91 16 95 %  03/31/20 1633 (!) 149/93 98.2 F (36.8 C) Oral 96 18 97 %  awake Says pain is better Left shoulder area swelling better Abduction slight improvement but still restricted    CBC Latest Ref Rng & Units 03/31/2020 03/30/2020 03/29/2020  WBC 4.0 - 10.5 K/uL 9.8 9.3 15.5(H)  Hemoglobin 13.0 - 17.0 g/dL 9.1(L) 9.5(L) 8.8(L)  Hematocrit 39.0 - 52.0 % 28.3(L) 28.6(L) 26.6(L)  Platelets 150 - 400 K/uL 552(H) 590(H) 641(H)     CMP Latest Ref Rng & Units 03/31/2020 03/30/2020 03/29/2020  Glucose 70 - 99 mg/dL 98 193(H) 202(H)  BUN 8 - 23 mg/dL 26(H) 34(H) 28(H)  Creatinine 0.61 - 1.24 mg/dL 0.63 0.62 0.55(L)  Sodium 135 - 145 mmol/L 141 139 138  Potassium 3.5 - 5.1 mmol/L 3.8 4.2 3.7  Chloride 98 - 111 mmol/L 113(H) 109 109  CO2 22 - 32 mmol/L 20(L) 23 22  Calcium 8.9 - 10.3 mg/dL 8.3(L) 8.3(L) 8.2(L)  Total Protein 6.5 - 8.1 g/dL - - -  Total Bilirubin 0.3 - 1.2 mg/dL - - -  Alkaline Phos 38 - 126 U/L - - -  AST 15 - 41 U/L - - -  ALT 0 - 44 U/L - - -    Imaging- cervical spine Progressive prevertebral soft tissue swelling extending from the base of the clivus to C6 concerning for infection. Abnormal marrow signal concerning for osteomyelitis of the C1, C2, C4, C5, T2, and T3 vertebral bodies.2. Ventral epidural phlegmon extending from C2-C4 to C5. Dorsal epidural phlegmon extending from C3-C4 through C6-C7. No discrete epidural abscess. No high grade spinal canal stenosis or cord signal abnormality.3. Acute to subacute mild T3 superior endplate compression  fracture. Small C4 vertebral body fracture is better evaluated on prior CT.4. Prior C5-C7 ACDF. Residual moderate to severe bilateral neuroforaminal stenosis at these levels.  Impression/recommendation Staph aureus bacteremiain a patient who is using cocaine.  MRI cervical spine shows Abnormal marrow signal concerning for osteomyelitis of the C1, C2, C4, C5, T2, and T3 vertebral bodies.2. Ventral epidural phlegmon extending from C2-C4 to C5. Dorsal epidural phlegmon extending from C3-C4 through C6-C7. No discrete epidural abscess.   Recommend neurosurgery evaluation Currently on nafcillin.  He has been started today on vancomycin which is not needed at his as it is MSSA.  We will discontinue He will need 6 weeks of IV nafcillin and then PO for a long time because of extent of infection--Follow ESR periodically Pt with h/o cocaine use- cannot be sent home with PICC   Thrombocytosis- likely due to infection- raises the concern for an abscess or collection  Left shoulder swelling and warmth and restricted mobility-improved- rotator cuff musculature myositis MRI   Patient had left AMA twice before.   C4 vertebral fracture. Needs Aspencollar to be worn all the time.but he refuses  Diabetes mellitus noncompliant with medication and hence gets admitted with DKA.  Bipolar disorder- seen by psychiatrist nd started  Rx.  Discussed the management with care team

## 2020-04-01 NOTE — Progress Notes (Signed)
OT Cancellation Note  Patient Details Name: Spencer [name] MRN: [id number] DOB: [birth year]   Cancelled Treatment:    Reason Eval/Treat Not Completed: Patient at procedure or test/ unavailable  Pt off floor at MRI at this time per RN. Will f/u for OT treatment at later date/time as able. Thank you.  Rejeana Brock, MS, OTR/L ascom [phone] 04/01/20, 11:28 AM

## 2020-04-02 DIAGNOSIS — J9601 Acute respiratory failure with hypoxia: Secondary | ICD-10-CM

## 2020-04-02 LAB — GLUCOSE, CAPILLARY
Glucose-Capillary: 133 mg/dL — ABNORMAL HIGH (ref 70–99)
Glucose-Capillary: 164 mg/dL — ABNORMAL HIGH (ref 70–99)
Glucose-Capillary: 174 mg/dL — ABNORMAL HIGH (ref 70–99)
Glucose-Capillary: 221 mg/dL — ABNORMAL HIGH (ref 70–99)
Glucose-Capillary: 250 mg/dL — ABNORMAL HIGH (ref 70–99)
Glucose-Capillary: 262 mg/dL — ABNORMAL HIGH (ref 70–99)
Glucose-Capillary: 306 mg/dL — ABNORMAL HIGH (ref 70–99)

## 2020-04-02 LAB — CREATININE, SERUM
Creatinine, Ser: 0.66 mg/dL (ref 0.61–1.24)
GFR calc Af Amer: >60 mL/min (ref >60)
GFR calc non Af Amer: >60 mL/min (ref >60)

## 2020-04-02 LAB — MRSA PCR SCREENING: MRSA by PCR: NEGATIVE

## 2020-04-02 NOTE — Progress Notes (Signed)
OT Cancellation Note  Patient Details Name: Spencer [name] MRN: [id number] DOB: [birth year]   Cancelled Treatment:    Reason Eval/Treat Not Completed: Medical issues which prohibited therapy  Upon chart review this AM, it was noted that pt with rapid response called last night d/t decreased O2 sats, SOB, hypoxia and diaphoresis. Pt transferred to stepdown unit d/t need for BiPap and Lasix. Occupational Therapy order not continued at time of patient transfer. Will require new orders to resume therapy if/when pt becomes appropriate. Thank you.  Rejeana Brock, MS, OTR/L ascom [phone] 04/02/20, 8:25 AM
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
## 2020-04-02 NOTE — Progress Notes (Signed)
PT Cancellation Note  Patient Details Name: FLORENTINO LAABS MRN: [id number] DOB: 12-Mar-1961   Cancelled Treatment:    Reason Eval/Treat Not Completed: Medical issues which prohibited therapy Pt with rapid response called last night, now in CCU.  Will complete PT orders at this time, will need new orders when medically appropriate.    Malachi Pro, DPT 04/02/2020, 1:13 PM

## 2020-04-02 NOTE — Progress Notes (Signed)
Patient transferred to the ICU due to increased SOB and work of breathing, as well as decreased SPO2. Patient was placed on BiPAP and 40 mg of lasix as well as 60 mg solu-medrol was given. Work of breathing has improved and he is resting comfortably on the BiPAP. He received one PRN dose of hydralazine (10 mg) due to high BP. Patient is currently resting comfortably and all vitals are WDL. Will continue to monitor.  Carmel [place], RN

## 2020-04-02 NOTE — Progress Notes (Signed)
Attempted to take patient off of bipap for break, placed on 3l [place], pt tolerating well at this time.

## 2020-04-02 NOTE — Progress Notes (Signed)
ID Patient got transferred to ICU last night because of shortness of breath.  Was diagnosed with pulmonary edema and was given IV Lasix and also received a dose of Solu-Medrol. He is doing much better now he is lying comfortably in bed and watching TV.  Patient Vitals for the past 24 hrs:  BP Temp Temp src Pulse Resp SpO2 Weight  04/02/20 1130 -- -- -- -- -- 96 % --  04/02/20 0900 (!) 153/92 98 F (36.7 C) -- 94 16 95 % --  04/02/20 0842 -- -- -- -- -- 97 % --  04/02/20 0832 -- -- -- 94 19 99 % --  04/02/20 0600 (!) 142/91 -- -- 97 16 98 % --  04/02/20 0500 (!) 133/96 -- -- (!) 103 16 96 % --  04/02/20 0400 (!) 163/90 -- -- 100 18 99 % --  04/02/20 0300 (!) 151/106 -- -- 88 14 99 % --  04/02/20 0200 (!) 151/93 97.8 F (36.6 C) Axillary 96 13 97 % --  04/02/20 0145 -- -- -- 91 15 97 % --  04/02/20 0000 (!) 139/100 -- -- 86 16 100 % --  04/01/20 2345 (!) 176/107 -- -- (!) 104 19 100 % --  04/01/20 2330 (!) 164/115 -- -- (!) 101 16 100 % --  04/01/20 2315 (!) 154/111 -- -- (!) 101 17 100 % --  04/01/20 2300 (!) 160/94 -- -- 100 19 100 % --  04/01/20 2245 -- -- -- (!) 101 18 100 % --  04/01/20 2238 (!) 162/104 -- -- (!) 107 (!) 23 -- --  04/01/20 2230 -- 97.7 F (36.5 C) Axillary (!) 103 (!) 21 100 % 70.3 kg  04/01/20 2215 (!) 188/119 -- -- (!) 114 (!) 25 100 % --  04/01/20 2138 (!) 249/138 -- -- (!) 122 -- 98 % --  04/01/20 1958 (!) 157/102 98.3 F (36.8 C) -- 97 18 99 % --  04/01/20 1642 (!) 152/107 97.8 F (36.6 C) -- 100 18 98 % --  On examination awake and alert no respiratory distress Chest bilateral air entry Heart sounds S1-S2 Abdomen soft Restricted abduction of the left shoulder    Labs CBC Latest Ref Rng & Units 04/01/2020 03/31/2020 03/30/2020  WBC 4.0 - 10.5 K/uL 10.4 9.8 9.3  Hemoglobin 13.0 - 17.0 g/dL 10.1(L) 9.1(L) 9.5(L)  Hematocrit 39.0 - 52.0 % 31.7(L) 28.3(L) 28.6(L)  Platelets 150 - 400 K/uL 506(H) 552(H) 590(H)    CMP Latest Ref Rng & Units 04/02/2020  04/01/2020 03/31/2020  Glucose 70 - 99 mg/dL - 341(H) 98  BUN 8 - 23 mg/dL - 29(H) 26(H)  Creatinine 0.61 - 1.24 mg/dL 0.66 0.85 0.63  Sodium 135 - 145 mmol/L - 138 141  Potassium 3.5 - 5.1 mmol/L - 4.2 3.8  Chloride 98 - 111 mmol/L - 109 113(H)  CO2 22 - 32 mmol/L - 23 20(L)  Calcium 8.9 - 10.3 mg/dL - 8.3(L) 8.3(L)  Total Protein 6.5 - 8.1 g/dL - 6.5 -  Total Bilirubin 0.3 - 1.2 mg/dL - 0.7 -  Alkaline Phos 38 - 126 U/L - 86 -  AST 15 - 41 U/L - 13(L) -  ALT 0 - 44 U/L - 13 -    Impression /recommendation Staph aureus bacteremia with extensive vertebral infection in a patient who was using cocaine. MRI cervical spine shows abnormal marrow signal concerning for osteomyelitis of the C1, C2, C4, C5, T2 and T3 vertebral bodies.  There is ventral epidural phlegmon  extending from C2 to C4-C5.  Dorsal epidural phlegmon extending from C3-C4 through C6-C7.  No discrete epidural abscess. Currently on nafcillin.  blood cultures from 03/15/2020 was negative for MSSA.  Another set has been sent yesterday. Would recommend reevaluation by neurosurgery in the light of progression of MRI findings on appropriate antibiotics.  Flash pulmonary edema likely due to hypertension.  Much improved  History of cocaine use History of leaving AMA twice before Bipolar disorder Seen by psychiatrist.  Discussed the management with the patient. ID will follow him peripherally this weekend.

## 2020-04-03 LAB — BASIC METABOLIC PANEL
Anion gap: 6 (ref 5–15)
BUN: 31 mg/dL — ABNORMAL HIGH (ref 8–23)
CO2: 25 mmol/L (ref 22–32)
Calcium: 8.2 mg/dL — ABNORMAL LOW (ref 8.9–10.3)
Chloride: 110 mmol/L (ref 98–111)
Creatinine, Ser: 0.62 mg/dL (ref 0.61–1.24)
GFR calc Af Amer: >60 mL/min (ref >60)
GFR calc non Af Amer: >60 mL/min (ref >60)
Glucose, Bld: 109 mg/dL — ABNORMAL HIGH (ref 70–99)
Potassium: 4 mmol/L (ref 3.5–5.1)
Sodium: 141 mmol/L (ref 135–145)

## 2020-04-03 LAB — GLUCOSE, CAPILLARY
Glucose-Capillary: 163 mg/dL — ABNORMAL HIGH (ref 70–99)
Glucose-Capillary: 214 mg/dL — ABNORMAL HIGH (ref 70–99)
Glucose-Capillary: 214 mg/dL — ABNORMAL HIGH (ref 70–99)
Glucose-Capillary: 267 mg/dL — ABNORMAL HIGH (ref 70–99)
Glucose-Capillary: 377 mg/dL — ABNORMAL HIGH (ref 70–99)
Glucose-Capillary: 390 mg/dL — ABNORMAL HIGH (ref 70–99)
Glucose-Capillary: 72 mg/dL (ref 70–99)

## 2020-04-03 MED ORDER — FUROSEMIDE 10 MG/ML IJ SOLN
40.0000 mg | Freq: Once | INTRAMUSCULAR | Status: AC
  Administered 2020-04-03: 40 mg via INTRAVENOUS
  Filled 2020-04-03: qty 4

## 2020-04-03 MED ORDER — INSULIN ASPART 100 UNIT/ML ~~LOC~~ SOLN: 12.0000 [IU] | Freq: Every day | SUBCUTANEOUS | Status: DC

## 2020-04-03 MED ORDER — INSULIN GLARGINE 100 UNIT/ML ~~LOC~~ SOLN
28.0000 [IU] | Freq: Every day | SUBCUTANEOUS | Status: DC
  Filled 2020-04-03: qty 0.28

## 2020-04-03 MED ORDER — IPRATROPIUM-ALBUTEROL 0.5-2.5 (3) MG/3ML IN SOLN
3.0000 mL | Freq: Four times a day (QID) | RESPIRATORY_TRACT | Status: DC
  Administered 2020-04-03 – 2020-04-07 (×11): 3 mL via RESPIRATORY_TRACT
  Filled 2020-04-03 (×13): qty 3

## 2020-04-03 MED ORDER — INSULIN ASPART 100 UNIT/ML ~~LOC~~ SOLN
4.0000 [IU] | Freq: Every day | SUBCUTANEOUS | Status: DC
  Administered 2020-04-03: 4 [IU] via SUBCUTANEOUS
  Filled 2020-04-03: qty 1

## 2020-04-03 MED ORDER — INSULIN ASPART 100 UNIT/ML ~~LOC~~ SOLN
12.0000 [IU] | Freq: Every day | SUBCUTANEOUS | Status: DC
  Administered 2020-04-03: 12 [IU] via SUBCUTANEOUS
  Filled 2020-04-03: qty 1

## 2020-04-03 MED ORDER — METHYLPREDNISOLONE SODIUM SUCC 40 MG IJ SOLR
40.0000 mg | Freq: Once | INTRAMUSCULAR | Status: AC
  Administered 2020-04-03: 40 mg via INTRAVENOUS
  Filled 2020-04-03: qty 1

## 2020-04-03 NOTE — Progress Notes (Signed)
RR called, on arrival to bedside patient alert, short of breath, diaphoretic and hypertensive. On auscultation patient has course crackles with diminished o2 saturation.  Patient had been placed on NRB with positive response.  Ouma NP contacted orders received for transfer to ICU stepdown with bipap, see flowsheet for further update.

## 2020-04-03 NOTE — Progress Notes (Signed)
This nurse was called to room by NT. On assessment pt is sweaty and report he cannot breath. Focused assessment revealed fluid build up in lungs. Oxygen was 60 on room air. Rapid Response called and pt was given 10mg  hydralazine for BP and is currently waiting on lasix order from hospitalist. Pt is placed on BiPAP and is currently more stable. Will transfer pt to ICU.

## 2020-04-03 NOTE — Progress Notes (Signed)
PROGRESS NOTE  Spencer [name]:[id number] DOB: [birth year] DOA: 03/13/2020 PCP: Patient, No Pcp Per  Brief History   59 year old male with history of uncontrolled type 2 diabetes mellitus, cocaine use, brain aneurysm, hypertension, GERD recently hospitalized on 4/28 for DKA, MSSA bacteremia and AKI but signed out AMA on 5/5 was brought by EMS with hyperglycemia and encephalopathy.  In the ED was found to be in DKA with high anion gap of 30.  Given aggressive IV hydration and insulin drip with resolution of DKA.  Hospital course prolonged due to presence of recurrent MSSA bacteremia.   DKA is resolved. The patient will need to complete 6 weeks of IV nafcillin prior to discharge per ID. Day one would have been 03/13/2020. Last day of therapy would be 04/26/2020.  Consultants  . Psychiatry . Infectious disease . Cardiology  Procedures  . None  Antibiotics   Anti-infectives (From admission, onward)   Start     Dose/Rate Route Frequency Ordered Stop   04/01/20 2000  vancomycin (VANCOREADY) IVPB 1500 mg/300 mL  Status:  Discontinued     1,500 mg 150 mL/hr over 120 Minutes Intravenous Every 12 hours 04/01/20 1818 04/01/20 1854   04/01/20 1800  vancomycin (VANCOREADY) IVPB 1500 mg/300 mL  Status:  Discontinued     1,500 mg 150 mL/hr over 120 Minutes Intravenous Every 12 hours 04/01/20 1725 04/01/20 1818   03/26/20 0900  nafcillin 12 g in sodium chloride 0.9 % 500 mL continuous infusion     12 g 20.8 mL/hr over 24 Hours Intravenous Every 24 hours 03/25/20 1551     03/25/20 0800  nafcillin 12 g in sodium chloride 0.9 % 500 mL continuous infusion  Status:  Discontinued     12 g 20.8 mL/hr over 24 Hours Intravenous Every 24 hours 03/24/20 1842 03/24/20 2116   03/25/20 0800  nafcillin 12 g in sodium chloride 0.9 % 500 mL injection  Status:  Discontinued     12 g 20.8 mL/hr over 24 Hours Intravenous Continuous 03/24/20 2118 03/24/20 2201   03/25/20 0800  nafcillin 12 g in sodium chloride 0.9 % 500  mL injection  Status:  Discontinued     12 g 20.8 mL/hr over 24 Hours Intravenous Every 24 hours 03/24/20 2201 03/25/20 1551   03/24/20 2215  nafcillin 12 g in sodium chloride 0.9 % 500 mL injection  Status:  Discontinued     12 g 20.8 mL/hr over 24 Hours Intravenous Every 24 hours 03/24/20 2201 03/24/20 2201   03/19/20 0730  nafcillin 12 g in sodium chloride 0.9 % 500 mL continuous infusion  Status:  Discontinued     12 g 20.8 mL/hr over 24 Hours Intravenous Every 24 hours 03/18/20 1205 03/24/20 1842   03/17/20 1200  nafcillin 12 g in sodium chloride 0.9 % 500 mL continuous infusion  Status:  Discontinued     12 g 20.8 mL/hr over 24 Hours Intravenous Every 24 hours 03/17/20 1003 03/18/20 1205   03/14/20 0830  ceFAZolin (ANCEF) IVPB 1 g/50 mL premix  Status:  Discontinued     1 g 100 mL/hr over 30 Minutes Intravenous Every 8 hours 03/14/20 0823 03/14/20 0823   03/14/20 0830  ceFAZolin (ANCEF) IVPB 2g/100 mL premix  Status:  Discontinued     2 g 200 mL/hr over 30 Minutes Intravenous Every 8 hours 03/14/20 0823 03/17/20 1003   03/13/20 2115  ceFEPIme (MAXIPIME) 2 g in sodium chloride 0.9 % 100 mL IVPB     2  g 200 mL/hr over 30 Minutes Intravenous  Once 03/13/20 2114 03/14/20 0218    .   Subjective  The patient is resting comfortably. No new complaints.  Objective   Vitals:  Vitals:   04/03/20 0813 04/03/20 1203  BP: (!) 157/90 (!) 161/97  Pulse: 97 96  Resp: 19 19  Temp: 98.7 F (37.1 C) 98.2 F (36.8 C)  SpO2: 98% 96%   Exam:  Constitutional:  . The patient is awake, alert, and oriented x 3. No acute distress. Respiratory:  . No increased work of breathing. . No wheezes, rales, or rhonchi . No tactile fremitus Cardiovascular:  . Regular rate and rhythm . No murmurs, ectopy, or gallups. . No lateral PMI. No thrills. Abdomen:  . Abdomen is soft, non-tender, non-distended . No hernias, masses, or organomegaly . Normoactive bowel sounds.  Musculoskeletal:  . No  cyanosis, clubbing, or edema Skin:  . No rashes, lesions, ulcers . palpation of skin: no induration or nodules Neurologic:  . CN 2-12 intact . Sensation all 4 extremities intact Psychiatric:  . Mental status o Mood, affect appropriate o Orientation to person, place, time  . judgment and insight appear intact  I have personally reviewed the following:   Today's Data  . Vitals, BMP  Micro Data  . Blood cultures x 2 04/01/2020: No growth. . Blood cultures x 2 03/13/2020: MSSA  Imaging  . None  Cardiology Data  . EKG  Scheduled Meds: . amLODipine  10 mg Oral Daily  . aspirin EC  81 mg Oral Daily  . budesonide (PULMICORT) nebulizer solution  0.25 mg Nebulization BID  . chlorhexidine  15 mL Mouth Rinse BID  . Chlorhexidine Gluconate Cloth  6 each Topical Daily  . clonazePAM  0.25 mg Oral BID  . DULoxetine  30 mg Oral Daily  . enoxaparin (LOVENOX) injection  40 mg Subcutaneous Q24H  . famotidine  20 mg Oral Daily  . feeding supplement (GLUCERNA SHAKE)  237 mL Oral BID BM  . haloperidol  0.25 mg Oral BID  . insulin aspart  0-15 Units Subcutaneous TID WC  . insulin aspart  0-5 Units Subcutaneous QHS  . [START ON 04/04/2020] insulin aspart  12 Units Subcutaneous Q breakfast  . insulin aspart  12 Units Subcutaneous Q lunch  . insulin aspart  4 Units Subcutaneous Q supper  . [START ON 04/04/2020] insulin glargine  28 Units Subcutaneous Daily  . ipratropium-albuterol  3 mL Nebulization Q6H  . losartan  50 mg Oral Daily  . mouth rinse  15 mL Mouth Rinse q12n4p  . methocarbamol  500 mg Oral TID  . multivitamin with minerals  1 tablet Oral Daily  . OLANZapine  5 mg Oral QHS  . polyethylene glycol  17 g Oral Daily   Continuous Infusions: . sodium chloride 500 mL (03/27/20 1904)  . nafcillin (NAFCIL) continuous infusion 12 g (04/03/20 1340)    Principal Problem:   MSSA bacteremia Active Problems:   DKA (diabetic ketoacidoses) ([place])   Hypertension   Diabetic neuropathy ([facility])    DM2 (diabetes mellitus, type 2) ([place])   History of cocaine abuse ([place])   Personal history of subdural hematoma   AKI (acute kidney injury) ([place])   GERD (gastroesophageal reflux disease)   C4 cervical fracture ([facility])   Cigarette smoker   Pressure injury of skin   LOS: 21 days   A & P  MSSA bacteremia, persistent: Left AMA during last hospital stay.  Blood culture on admission (5/8)  positive for MSSA.  2D echo was unremarkable.  He refused TEE.  Patient was cleared to get TEE by neurosurgery regarding his C-spine fracture.  However after discussion with cardiology with concern for moderate sedation and high risk due to cervical fracture it was decided not to pursue TEE. Repeat blood culture from 5/10 - for further growth.  As per ID recommendation he needs to be treated as inpatient with IV nafcillin for total 6 weeks (until 04/26/2020).  Osteomyelitis of cervical and thoracic spine: MRI of the cervical spine done on 5/27 with concern for myositis seen on MRI of the left shoulder. Showed progressive perivertebral soft tissue swelling with concern for osteomyelitis of C1-C5, T2 and T3 vertebral bodies.  Also commenced on ventral epidural phlegmon extending from C2-C5 and dorsal epidural phlegmon extending from C3-C4 through C6-C7. No discrete epidural abscess, no high-grade spinal stenosis or cord signal abnormality. There is also an acute to subacute mild T3 superior endplate compression fracture. Discussed with ID, no recommendation to escalate antibiotic. Consult neurosurgery on Monday for any surgical intervention needed.  Type 2 diabetes mellitus, poorly controlled with hyperglycemia and hypoglycemia: A1c of 12.6.  Patient presented with recurrent DKA which resolved with aggressive IV fluids and IV insulin.  Continue Lantus.  Reduce Premeal aspart dose given episodes of hypoglycemia on 5/26.  CBG currently stable.  Acute respiratory distress on 5/28: Patient became hypoxic, tachypneic,  tachycardic with accelerated hypertension. Required BiPAP briefly and transferred to stepdown.  Chest x-ray showed acute pulmonary edema. Given IV Lasix 40 mg x 1 and IV Solu-Medrol 60 mg with improvement in symptoms. Currently maintaining sats on room air in no respiratory distress. Stable for transfer back to medical floor.  Ongoing cocaine abuse: Urine drug screen positive this admission.  Essential hypertension: Stable. Continue amlodipine and losartan.  Thrombocytosis: Possibly reactive. Stable.  Fall on 5/24: Patient hit his head with mild frontal scalp swelling. No acute intracranial injury per head CT.  Acute kidney injury: Prerenal secondary to dehydration with DKA.  Resolved with fluids.  Intermittent agitation: On as needed low-dose Haldol.  Minimally displaced C4 fracture: Recommendation to wear Aspen collar at all times per patient refusing.  Per prior neurosurgery evaluation patient was okay to undergo TEE as long as he had the Aspen collar in place.  Outpatient follow-up with neurosurgery Patsey Berthold, NP).  Issues with decision-making capacity: Patient has been evaluated by psychiatry on 3 occasions this admission 5/12, 5/15 and 5/27 with latest evaluation today recommending that PATIENT HAS CAPACITY  to make complex medical decisions and understands the nature of his illness.  Patient cannot be involuntarily committed if he decides to leave AGAINST MEDICAL ADVICE.  I have seen and examined this patient myself. I have spent 34 minutes in his evaluation and care.  DVT prophylaxis: Subcu Lovenox Code Status: Full code Family Communication: None available Disposition:   Status is: Inpatient  Remains inpatient appropriate because:Unsafe d/c plan.  Needs IV antibiotics until 6/21   Dispo: The patient is from: Home  Anticipated d/c is to: Home  Anticipated d/c date is: > 3 days  Patient currently is not medically stable  to d/c. Barriers to discharge: Need to complete 6 weeks of IV antibiotics. Last day of therapy 04/26/2020.  Ava Swayze, DO [facility] Direct contact: see [email or web address]  7PM-7AM contact night coverage as above 04/03/2020, 2:27 PM  LOS: 21 days

## 2020-04-03 NOTE — Progress Notes (Signed)
   04/03/20 2100  Clinical Encounter Type  Visited With Patient  Visit Type Initial  Referral From Nurse  Consult/Referral To Chaplain  Arrived on unit Pt was having trouble breathing. I talked to nurse, she said they are moving him back to ICU. Nurse asked Pt if he wanted to talk to the Ch, Pt said no. Will follow-up with Pt.

## 2020-04-03 NOTE — Progress Notes (Signed)
   04/03/20 2054  Assess: MEWS Score  BP (!) 239/139  SpO2 (!) 60 %  O2 Device Room Air  Assess: MEWS Score  MEWS Temp 0  MEWS Systolic 2  MEWS Pulse 0  MEWS RR 0  MEWS LOC 0  MEWS Score 2  MEWS Score Color Yellow  Assess: if the MEWS score is Yellow or Red  Were vital signs taken at a resting state? Yes  Focused Assessment Documented focused assessment  Early Detection of Sepsis Score *See Row Information* Low  MEWS guidelines implemented *See Row Information* No, other (Comment) (called Rapid Response)  Treat  MEWS Interventions Administered scheduled meds/treatments  Escalate  MEWS: Escalate Yellow: discuss with charge nurse/RN and consider discussing with provider and RRT  Notify: Charge Nurse/RN  Name of Charge Nurse/RN Notified Corrie Dandy   Date Charge Nurse/RN Notified 04/03/20  Time Charge Nurse/RN Notified 2054  Notify: Provider  Provider Name/Title Webb Silversmith  Date Provider Notified 04/03/20  Time Provider Notified 2056  Notification Type Page  Notification Reason Change in status  Response Other (Comment) (transfer to ICU)  Date of Provider Response 04/03/20  Time of Provider Response 2100  Notify: Rapid Response  Name of Rapid Response RN Notified Erica  Date Rapid Response Notified 04/03/20  Time Rapid Response Notified 2054  Document  Patient Outcome Transferred/level of care increased;Other (Comment) (Placed on BIPAP until transfer)  Progress note created (see row info) Yes

## 2020-04-04 LAB — GLUCOSE, CAPILLARY
Glucose-Capillary: 265 mg/dL — ABNORMAL HIGH (ref 70–99)
Glucose-Capillary: 295 mg/dL — ABNORMAL HIGH (ref 70–99)
Glucose-Capillary: 314 mg/dL — ABNORMAL HIGH (ref 70–99)
Glucose-Capillary: 395 mg/dL — ABNORMAL HIGH (ref 70–99)
Glucose-Capillary: 230 mg/dL — ABNORMAL HIGH (ref 70–99)
Glucose-Capillary: 110 mg/dL — ABNORMAL HIGH (ref 70–99)
Glucose-Capillary: 201 mg/dL — ABNORMAL HIGH (ref 70–99)
Glucose-Capillary: 201 mg/dL — ABNORMAL HIGH (ref 70–99)
Glucose-Capillary: 211 mg/dL — ABNORMAL HIGH (ref 70–99)

## 2020-04-04 MED ORDER — PREDNISONE 20 MG PO TABS
60.0000 mg | ORAL_TABLET | Freq: Every day | ORAL | Status: DC
  Administered 2020-04-05 – 2020-04-12 (×8): 60 mg via ORAL
  Filled 2020-04-04 (×8): qty 3

## 2020-04-04 MED ORDER — INSULIN ASPART 100 UNIT/ML ~~LOC~~ SOLN
16.0000 [IU] | Freq: Every day | SUBCUTANEOUS | Status: DC
  Administered 2020-04-04: 16 [IU] via SUBCUTANEOUS
  Filled 2020-04-04 (×2): qty 1

## 2020-04-04 MED ORDER — FUROSEMIDE 20 MG PO TABS
20.0000 mg | ORAL_TABLET | Freq: Two times a day (BID) | ORAL | Status: DC
  Administered 2020-04-04 – 2020-04-05 (×2): 20 mg via ORAL
  Filled 2020-04-04 (×2): qty 1

## 2020-04-04 MED ORDER — INSULIN GLARGINE 100 UNIT/ML ~~LOC~~ SOLN
32.0000 [IU] | Freq: Every day | SUBCUTANEOUS | Status: DC
  Administered 2020-04-04 – 2020-04-23 (×20): 32 [IU] via SUBCUTANEOUS
  Filled 2020-04-04 (×20): qty 0.32

## 2020-04-04 MED ORDER — CLONAZEPAM 0.25 MG PO TBDP
0.5000 mg | ORAL_TABLET | Freq: Two times a day (BID) | ORAL | Status: DC
  Administered 2020-04-04 – 2020-04-17 (×26): 0.5 mg via ORAL
  Filled 2020-04-04: qty 1
  Filled 2020-04-04 (×4): qty 2
  Filled 2020-04-04: qty 1
  Filled 2020-04-04 (×6): qty 2
  Filled 2020-04-04: qty 1
  Filled 2020-04-04 (×2): qty 2
  Filled 2020-04-04: qty 1
  Filled 2020-04-04 (×5): qty 2
  Filled 2020-04-04: qty 1
  Filled 2020-04-04 (×4): qty 2

## 2020-04-04 MED ORDER — INSULIN ASPART 100 UNIT/ML ~~LOC~~ SOLN
8.0000 [IU] | Freq: Every day | SUBCUTANEOUS | Status: DC
  Administered 2020-04-05: 8 [IU] via SUBCUTANEOUS
  Filled 2020-04-04: qty 1

## 2020-04-04 MED ORDER — INSULIN ASPART 100 UNIT/ML ~~LOC~~ SOLN
16.0000 [IU] | Freq: Every day | SUBCUTANEOUS | Status: DC
  Administered 2020-04-04 – 2020-04-06 (×3): 16 [IU] via SUBCUTANEOUS
  Filled 2020-04-04 (×2): qty 1

## 2020-04-04 MED ORDER — INSULIN ASPART 100 UNIT/ML ~~LOC~~ SOLN: 16.0000 [IU] | Freq: Every day | SUBCUTANEOUS | Status: DC

## 2020-04-04 NOTE — Progress Notes (Signed)
     BRIEF OVERNIGHT PROGRESS REPORT   SUBJECTIVE: Rapid response initiated for acute respiratory distress. Per patient's RN, patient got up to use the bathroom and developed severe SOB associated with hypoxia, diaphoresis and increased work of breathing.  OBJECTIVE:On assessment he was afebrile with blood pressure 186/124mm Hg and pulse rate 131 beats/min. There were no focal neurological deficits; he was alert but very SOB. Per RR RN,Patient noted with increased work of breathing with accessory muscle use, paradoxical movement of the abdomen and supraclavicular and intercostal retraction. Lung sounds with crackles bilaterally  BRIEF PATIENT DESCRIPTION:59 year old male with PMH uncontrolled type 2 DM, cocaine use, brain aneurysm, hypertension, GERD recently hospitalized on 4/28 for DKA, MSSA bacteremia and AKI but signed out AMA on 5/5 readmitted with DKA. Now with acute respiratory failure with hypoxia requiring BiPAP and tranfer to stepdown.  ASSESSMENT: Acute respiratory failure with hypoxia  PLAN:  -Acute Hypoxic  Respiratory Failure secondary to Acute pulmonary edema/Pleural Effusion -Transfer to Stepdown -Supplemental O2 as needed to maintain O2 saturations 88 to 92% -BiPAP, wean as tolerated -High risk for intubation -Follow intermittent ABG and chest x-ray as needed -Repeat CXR on 5/27 showed moderate pulmonary edema with small pleural effusion new from prior xray -IV Lasix 40 mg x 1 administered -As needed bronchodilators -Steroids 40mg  x1 administered -Check labs CBC, BNP, CMP       , DNP, CCRN, FNP-C [facility]

## 2020-04-04 NOTE — Progress Notes (Signed)
Patient has removed his oxygen probe 5 times. After redirections and education patient continues to remove oxygen probe. When patient has oxygen probe on his saturations are 95%-97%.

## 2020-04-04 NOTE — Progress Notes (Signed)
PROGRESS NOTE  Spencer [name] FXJ:[id number] DOB: January 16, 1961 DOA: 03/13/2020 PCP: Patient, No Pcp Per  Brief History   59 year old male with history of uncontrolled type 2 diabetes mellitus, cocaine use, brain aneurysm, hypertension, GERD recently hospitalized on 4/28 for DKA, MSSA bacteremia and AKI but signed out AMA on 5/5 was brought by EMS with hyperglycemia and encephalopathy.  In the ED was found to be in DKA with high anion gap of 30.  Given aggressive IV hydration and insulin drip with resolution of DKA.  Hospital course prolonged due to presence of recurrent MSSA bacteremia.   DKA is resolved. The patient will need to complete 6 weeks of IV nafcillin prior to discharge per ID. Day one would have been 03/13/2020. Last day of therapy would be 04/26/2020.  On the evening of 04/03/2020 the patient developed acute flash pulmonary edema. He was transferred to the ICU and given diuresis and placed on BIPAP. This morning he is saturating at 98% on 2 liters.   The patient has been very agitated the last couple of days according to his sister. She states that the patient is aware that Tuesday is 04/06/2020, and that is the day that he gets his check, and it is usually the day that he spends it all on drugs. She stats that this is behind his agitation.  Consultants  . Psychiatry . Infectious disease . Cardiology  Procedures  . None  Antibiotics   Anti-infectives (From admission, onward)   Start     Dose/Rate Route Frequency Ordered Stop   04/01/20 2000  vancomycin (VANCOREADY) IVPB 1500 mg/300 mL  Status:  Discontinued     1,500 mg 150 mL/hr over 120 Minutes Intravenous Every 12 hours 04/01/20 1818 04/01/20 1854   04/01/20 1800  vancomycin (VANCOREADY) IVPB 1500 mg/300 mL  Status:  Discontinued     1,500 mg 150 mL/hr over 120 Minutes Intravenous Every 12 hours 04/01/20 1725 04/01/20 1818   03/26/20 0900  nafcillin 12 g in sodium chloride 0.9 % 500 mL continuous infusion     12 g 20.8 mL/hr  over 24 Hours Intravenous Every 24 hours 03/25/20 1551     03/25/20 0800  nafcillin 12 g in sodium chloride 0.9 % 500 mL continuous infusion  Status:  Discontinued     12 g 20.8 mL/hr over 24 Hours Intravenous Every 24 hours 03/24/20 1842 03/24/20 2116   03/25/20 0800  nafcillin 12 g in sodium chloride 0.9 % 500 mL injection  Status:  Discontinued     12 g 20.8 mL/hr over 24 Hours Intravenous Continuous 03/24/20 2118 03/24/20 2201   03/25/20 0800  nafcillin 12 g in sodium chloride 0.9 % 500 mL injection  Status:  Discontinued     12 g 20.8 mL/hr over 24 Hours Intravenous Every 24 hours 03/24/20 2201 03/25/20 1551   03/24/20 2215  nafcillin 12 g in sodium chloride 0.9 % 500 mL injection  Status:  Discontinued     12 g 20.8 mL/hr over 24 Hours Intravenous Every 24 hours 03/24/20 2201 03/24/20 2201   03/19/20 0730  nafcillin 12 g in sodium chloride 0.9 % 500 mL continuous infusion  Status:  Discontinued     12 g 20.8 mL/hr over 24 Hours Intravenous Every 24 hours 03/18/20 1205 03/24/20 1842   03/17/20 1200  nafcillin 12 g in sodium chloride 0.9 % 500 mL continuous infusion  Status:  Discontinued     12 g 20.8 mL/hr over 24 Hours Intravenous Every 24 hours  03/17/20 1003 03/18/20 1205   03/14/20 0830  ceFAZolin (ANCEF) IVPB 1 g/50 mL premix  Status:  Discontinued     1 g 100 mL/hr over 30 Minutes Intravenous Every 8 hours 03/14/20 0823 03/14/20 0823   03/14/20 0830  ceFAZolin (ANCEF) IVPB 2g/100 mL premix  Status:  Discontinued     2 g 200 mL/hr over 30 Minutes Intravenous Every 8 hours 03/14/20 0823 03/17/20 1003   03/13/20 2115  ceFEPIme (MAXIPIME) 2 g in sodium chloride 0.9 % 100 mL IVPB     2 g 200 mL/hr over 30 Minutes Intravenous  Once 03/13/20 2114 03/14/20 0218      Subjective  The patient is resting comfortably. No new complaints.  Objective   Vitals:  Vitals:   04/04/20 1200 04/04/20 1300  BP: (!) 143/97 (!) 162/101  Pulse: (!) 104 (!) 108  Resp: 18 (!) 21  Temp:     SpO2: 98% 95%   Exam:  Constitutional:  . The patient is awake, alert, and oriented x 3. No acute distress. Respiratory:  . No increased work of breathing. . No wheezes, rales, or rhonchi . No tactile fremitus Cardiovascular:  . Regular rate and rhythm . No murmurs, ectopy, or gallups. . No lateral PMI. No thrills. Abdomen:  . Abdomen is soft, non-tender, non-distended . No hernias, masses, or organomegaly . Normoactive bowel sounds.  Musculoskeletal:  . No cyanosis, clubbing, or edema Skin:  . No rashes, lesions, ulcers . palpation of skin: no induration or nodules Neurologic:  . CN 2-12 intact . Sensation all 4 extremities intact Psychiatric:  . Mental status o Mood, affect appropriate o Orientation to person, place, time  . judgment and insight appear intact  I have personally reviewed the following:   Today's Data  . Vitals, BMP  Micro Data  . Blood cultures x 2 04/01/2020: No growth. . Blood cultures x 2 03/13/2020: MSSA  Imaging  . None  Cardiology Data  . EKG  Scheduled Meds: . amLODipine  10 mg Oral Daily  . aspirin EC  81 mg Oral Daily  . budesonide (PULMICORT) nebulizer solution  0.25 mg Nebulization BID  . chlorhexidine  15 mL Mouth Rinse BID  . Chlorhexidine Gluconate Cloth  6 each Topical Daily  . clonazePAM  0.5 mg Oral BID  . DULoxetine  30 mg Oral Daily  . enoxaparin (LOVENOX) injection  40 mg Subcutaneous Q24H  . famotidine  20 mg Oral Daily  . feeding supplement (GLUCERNA SHAKE)  237 mL Oral BID BM  . furosemide  20 mg Oral BID  . haloperidol  0.25 mg Oral BID  . insulin aspart  0-15 Units Subcutaneous TID WC  . insulin aspart  0-5 Units Subcutaneous QHS  . insulin aspart  16 Units Subcutaneous Q lunch  . insulin aspart  16 Units Subcutaneous Q breakfast  . insulin aspart  8 Units Subcutaneous Q supper  . insulin glargine  32 Units Subcutaneous Daily  . ipratropium-albuterol  3 mL Nebulization Q6H  . losartan  50 mg Oral Daily  .  mouth rinse  15 mL Mouth Rinse q12n4p  . methocarbamol  500 mg Oral TID  . multivitamin with minerals  1 tablet Oral Daily  . OLANZapine  5 mg Oral QHS  . polyethylene glycol  17 g Oral Daily   Continuous Infusions: . sodium chloride 20.8 mL/hr at 04/01/20 0938  . nafcillin (NAFCIL) continuous infusion 12 g (04/04/20 1406)    Principal Problem:   MSSA  bacteremia Active Problems:   DKA (diabetic ketoacidoses) (HCC)   Hypertension   Diabetic neuropathy (HCC)   DM2 (diabetes mellitus, type 2) ([place])   History of cocaine abuse ([place])   Personal history of subdural hematoma   AKI (acute kidney injury) ([place])   GERD (gastroesophageal reflux disease)   C4 cervical fracture (HCC)   Cigarette smoker   Pressure injury of skin   LOS: 22 days   A & P  MSSA bacteremia, persistent: Left AMA during last hospital stay.  Blood culture on admission (5/8) positive for MSSA.  2D echo was unremarkable.  He refused TEE.  Patient was cleared to get TEE by neurosurgery regarding his C-spine fracture.  However after discussion with cardiology with concern for moderate sedation and high risk due to cervical fracture it was decided not to pursue TEE. Repeat blood culture from 5/10 - for further growth.  As per ID recommendation he needs to be treated as inpatient with IV nafcillin for total 6 weeks (until 04/26/2020).  Flash pulmonary edema: Cause unknown. TTE performed on 03/13/2020 demonstrated a normal EF and no valvular abnormalities. Pt declined TEE, so it is possible that there are vavlular derangements behind this. Will continue the patient on low dose lasix and monitor his volume status carefully. Blood pressures today are much better controlled. The patient had a similar episode on 04/02/2020. He responded to Iv lasix on that occasion as well. On both occasions he received steroids to address any bronchospasm that might be playing a role. This has been continued at a lower dose as well.   Osteomyelitis of  cervical and thoracic spine: MRI of the cervical spine done on 5/27 with concern for myositis seen on MRI of the left shoulder. Showed progressive perivertebral soft tissue swelling with concern for osteomyelitis of C1-C5, T2 and T3 vertebral bodies.  Also commenced on ventral epidural phlegmon extending from C2-C5 and dorsal epidural phlegmon extending from C3-C4 through C6-C7. No discrete epidural abscess, no high-grade spinal stenosis or cord signal abnormality. There is also an acute to subacute mild T3 superior endplate compression fracture. Discussed with ID, no recommendation to escalate antibiotic. Consult neurosurgery on Monday for any surgical intervention needed.  Type 2 diabetes mellitus, poorly controlled with hyperglycemia and hypoglycemia: A1c of 12.6.  Patient presented with recurrent DKA which resolved with aggressive IV fluids and IV insulin.  Continue Lantus.  Reduce Premeal aspart dose given episodes of hypoglycemia on 5/26.  CBG currently stable.  Ongoing cocaine abuse: Urine drug screen positive this admission. Pt is likely becoming agitation as June 1 approaches as this is when he would be receiving his check and buying drugs. This is according to his sister. Clonazepam and haldol are available to address this.  Essential hypertension: Stable. Continue amlodipine and losartan.  Thrombocytosis: Possibly reactive. Stable.  Fall on 5/24: Patient hit his head with mild frontal scalp swelling. No acute intracranial injury per head CT.  Acute kidney injury: Prerenal secondary to dehydration with DKA.  Resolved with fluids.  Minimally displaced C4 fracture: Recommendation to wear Aspen collar at all times per patient refusing.  Per prior neurosurgery evaluation patient was okay to undergo TEE as long as he had the Aspen collar in place.  Outpatient follow-up with neurosurgery Lonell Face, NP).  Issues with decision-making capacity: Patient has been evaluated by  psychiatry on 3 occasions this admission 5/12, 5/15 and 5/27 with latest evaluation today recommending that PATIENT HAS CAPACITY  to make complex medical decisions and understands  the nature of his illness.  Patient cannot be involuntarily committed if he decides to leave AGAINST MEDICAL ADVICE.  I have seen and examined this patient myself. I have spent 46 minutes in his evaluation and care. More than 50% of this was spent in discussing the patient with his sister.  DVT prophylaxis: Subcu Lovenox Code Status: Full code Family Communication: I have discussed the patient in detail with his sister. All questions answered to the best of my ability. Disposition:   Status is: Inpatient  Remains inpatient appropriate because:Unsafe d/c plan.  Needs IV antibiotics until 6/21   Dispo: The patient is from: Home  Anticipated d/c is to: Home  Anticipated d/c date is: > 3 days  Patient currently is not medically stable to d/c. Barriers to discharge: Need to complete 6 weeks of IV antibiotics. Last day of therapy 04/26/2020.  Spencer Swayze, DO [facility] Direct contact: see [email or web address]  7PM-7AM contact night coverage as above 04/04/2020, 4:15 PM  LOS: 21 days

## 2020-04-04 NOTE — Progress Notes (Signed)
Patient alert with intermittent confusion to situation. No complaints of pain or shortness of breath throughout shift. Tolerating diet and eating well. Good urine output through external catheter. Continue to assess.

## 2020-04-05 ENCOUNTER — Inpatient Hospital Stay: Payer: Medicaid Other

## 2020-04-05 LAB — CBC
HCT: 29 % — ABNORMAL LOW (ref 39.0–52.0)
Hemoglobin: 9.4 g/dL — ABNORMAL LOW (ref 13.0–17.0)
MCH: 29.7 pg (ref 26.0–34.0)
MCHC: 32.4 g/dL (ref 30.0–36.0)
MCV: 91.5 fL (ref 80.0–100.0)
Platelets: 364 10*3/uL (ref 150–400)
RBC: 3.17 MIL/uL — ABNORMAL LOW (ref 4.22–5.81)
RDW: 16.3 % — ABNORMAL HIGH (ref 11.5–15.5)
WBC: 8 10*3/uL (ref 4.0–10.5)
nRBC: 0 % (ref 0.0–0.2)

## 2020-04-05 LAB — COMPREHENSIVE METABOLIC PANEL
ALT: 11 U/L (ref 0–44)
AST: 13 U/L — ABNORMAL LOW (ref 15–41)
Albumin: 2 g/dL — ABNORMAL LOW (ref 3.5–5.0)
Alkaline Phosphatase: 77 U/L (ref 38–126)
Anion gap: 7 (ref 5–15)
BUN: 28 mg/dL — ABNORMAL HIGH (ref 8–23)
CO2: 24 mmol/L (ref 22–32)
Calcium: 8.1 mg/dL — ABNORMAL LOW (ref 8.9–10.3)
Chloride: 109 mmol/L (ref 98–111)
Creatinine, Ser: 0.55 mg/dL — ABNORMAL LOW (ref 0.61–1.24)
GFR calc Af Amer: >60 mL/min (ref >60)
GFR calc non Af Amer: >60 mL/min (ref >60)
Glucose, Bld: 169 mg/dL — ABNORMAL HIGH (ref 70–99)
Potassium: 3.9 mmol/L (ref 3.5–5.1)
Sodium: 140 mmol/L (ref 135–145)
Total Bilirubin: 0.7 mg/dL (ref 0.3–1.2)
Total Protein: 5.7 g/dL — ABNORMAL LOW (ref 6.5–8.1)

## 2020-04-05 LAB — GLUCOSE, CAPILLARY
Glucose-Capillary: 104 mg/dL — ABNORMAL HIGH (ref 70–99)
Glucose-Capillary: 152 mg/dL — ABNORMAL HIGH (ref 70–99)
Glucose-Capillary: 204 mg/dL — ABNORMAL HIGH (ref 70–99)
Glucose-Capillary: 217 mg/dL — ABNORMAL HIGH (ref 70–99)
Glucose-Capillary: 233 mg/dL — ABNORMAL HIGH (ref 70–99)
Glucose-Capillary: 260 mg/dL — ABNORMAL HIGH (ref 70–99)
Glucose-Capillary: 87 mg/dL (ref 70–99)

## 2020-04-05 MED ORDER — FUROSEMIDE 10 MG/ML IJ SOLN: 40.0000 mg | Freq: Once | INTRAMUSCULAR | Status: AC

## 2020-04-05 MED ORDER — FUROSEMIDE 40 MG PO TABS
40.0000 mg | ORAL_TABLET | Freq: Two times a day (BID) | ORAL | Status: DC
  Administered 2020-04-05 – 2020-04-26 (×42): 40 mg via ORAL
  Filled 2020-04-05 (×12): qty 1
  Filled 2020-04-05: qty 2
  Filled 2020-04-05 (×6): qty 1
  Filled 2020-04-05: qty 2
  Filled 2020-04-05 (×4): qty 1
  Filled 2020-04-05: qty 2
  Filled 2020-04-05 (×17): qty 1

## 2020-04-05 MED ORDER — FUROSEMIDE 10 MG/ML IJ SOLN
INTRAMUSCULAR | Status: AC
  Administered 2020-04-05: 40 mg via INTRAVENOUS
  Filled 2020-04-05: qty 4

## 2020-04-05 NOTE — Progress Notes (Signed)
Nutrition Follow-up  DOCUMENTATION CODES:   Severe malnutrition in context of social or environmental circumstances  INTERVENTION:  Continue Glucerna Shake po BID, each supplement provides 220 kcal and 10 grams of protein. Patient prefers chocolate.  Continue double protein portions at meals.  Continue daily MVI.  NUTRITION DIAGNOSIS:   Severe Malnutrition related to social / environmental circumstances(hx substance abuse, inadequate oral intake) as evidenced by severe fat depletion, severe muscle depletion.  Ongoing - addressing with nutrition interventions.  GOAL:   Patient will meet greater than or equal to 90% of their needs  Met.  MONITOR:   PO intake, Supplement acceptance, Labs, Weight trends, I & O's, Skin  REASON FOR ASSESSMENT:   Consult Assessment of nutrition requirement/status  ASSESSMENT:   59 year old male with PMHx of HTN, substance abuse, DM, GERD, brain aneurysm admitted with DKA, MSSA bacteremia, AKI, metabolic encephalopathy.   -On AM of 5/29 patient developed acute flash pulmonary edema and transferred to ICU.   Met with patient at bedside. He reports his appetite remains good and he is eating well at meals. Patient continues to eat 100% of his meals. He is drinking Glucerna BID and prefers chocolate flavor. In the past 24 hours patient has had approximately 2216 kcal (>100% estimated needs) and 120 grams of protein (>100% estimated needs).  Medications reviewed and include: Lasix 20 mg BID, Novolog 0-15 units TID, Novolog 0-5 units QHS, Novolog 16 units daily with breakfast and lunch, Novolog 8 units daily with supper, Lantus 32 units daily, MVI daily, Miralax 17 grams daily, prednisone 60 mg daily, nafcillin.  Labs reviewed: CBG 104-204, BUN 28, Creatinine 0.55.   Diet Order:   Diet Order            Diet Carb Modified Fluid consistency: Thin; Room service appropriate? Yes with Assist  Diet effective now             EDUCATION NEEDS:   Not  appropriate for education at this time  Skin:  Skin Assessment: Skin Integrity Issues:(Stage 2 right ischial tuberosity; unstageable sacrum (1cm x 1.8cm); unstageable right elbow (2cm x 1.5cm x 0.1cm))  Last BM:  04/03/2020 - large type 2  Height:   Ht Readings from Last 1 Encounters:  03/13/20 _0  (1.778 m)   Weight:   Wt Readings from Last 1 Encounters:  04/01/20 70.3 kg   Ideal Body Weight:  75.5 kg  BMI:  Body mass index is 22.24 kg/m.  Estimated Nutritional Needs:   Kcal:  1800-2100  Protein:  90-100 grams  Fluid:  1.8-2 L/day  Jacklynn Barnacle, MS, RD, LDN Pager number available on Amion

## 2020-04-05 NOTE — Progress Notes (Signed)
Spoke with the MD regarding his insulin doses and blood sugar, last CBG was 87.  MD asked to hold afternoon insulin dosage.  Will continue to monitor.

## 2020-04-05 NOTE — Progress Notes (Signed)
PROGRESS NOTE    Spencer [name]  BPZ:[id number] DOB: 1961/02/09 DOA: 03/13/2020 PCP: Patient, No Pcp Per    Chief Complaint  Patient presents with  . Hyperglycemia    Brief Narrative:  59 year old male with history of uncontrolled type 2 diabetes mellitus, cocaine use, brain aneurysm, hypertension, GERD recently hospitalized on 4/28 for DKA, MSSA bacteremia and AKI but signed out AMA on 5/5 was brought by EMS with hyperglycemia and encephalopathy. In the ED was found to be in DKA with high anion gap of 30. Given aggressive IV hydration and insulin drip with resolution of DKA. Hospital course prolonged due to presence of recurrent MSSA bacteremia.   DKA resolved.. The patient will need to complete 6 weeks of IV nafcillin prior to discharge per ID.  Last day of therapy would be 04/26/2020.  On the evening of 04/03/2020 the patient developed acute flash pulmonary edema. He was transferred to the ICU and given diuresis and placed on BIPAP.  He had similar presentation on 5/27 requiring transfer to ICU.   Assessment & Plan:   Principal Problem:   MSSA bacteremia,, persistent Patient left AMA during last hospital stay.  Blood culture on admission (5/8) positive for MSSA.  2D echo unremarkable.  Refused TEE, although was cleared by neurosurgery for rate regarding his C-spine fracture. Repeat blood culture on 5/10, negative. IV nafcillin for total 6 weeks (until 04/26/2020).  Needs to be treated for full course of antibiotic in the hospital.   Active Problems: Acute pulmonary edema Unclear etiology.  2D echo from 5/8 with normal EF and no valvular abnormality.  Patient refused TEE. Monitor strict I's/O. Blood pressure currently stable.  As needed IV Lasix.  Also receiving intermittent steroid regarding his bronchospasm.  Cervical and thoracic spine osteomyelitis Seen on MRI of the cervical spine from 5/27 (done with concern for myositis on MRI of the left shoulder). Showed progressive  perivertebral soft tissue swelling with concern for osteomyelitis of C1-C5, T2 and T3 vertebral bodies.  Also has ventral epidural phlegmon extending from C2-C5 and dorsal epidural phlegmon extending from C3-C4-6 6-C7, without discrete epidural abscess, high-grade stenosis or cord signal abnormality.  Also has an acute to subacute mild T3 superior endplate compression fracture. ID recommends no escalation of antibiotic.  We will consult neurosurgery if any surgical intervention is needed.  Diabetes mellitus type 2, poorly controlled with hyper and hypoglycemia A1c of 12.6.  Presented with recurrent DKA which has resolved.  Currently on Lantus with Premeal aspart and sliding scale coverage.  Needs close CBG monitoring.  Minimally displaced C4 fracture Recommendation to wear Aspen collar at all times per patient refusing.  Outpatient follow-up with neurosurgery Patsey Berthold, NP).  Essential hypertension Stable.  Continue amlodipine and losartan.  Ongoing cocaine use Urine drug screen positive this admission.  He reportedly was getting agitated past 2 days and per sister he was supposed to receive paycheck with which he could buy some drugs.  On as needed clonazepam and Haldol.  Acute kidney injury Prerenal secondary to DKA.  Resolved.  Issues with decision-making capacity Seen by psychiatry multiple times this admission and has capacity.  Cannot be involuntarily committed if he decides to leave AMA.     DVT prophylaxis: Subcu Lovenox Code Status: Full code Family Communication: We will update his sister Disposition:   Status is: Inpatient  Remains inpatient appropriate because:Unsafe d/c plan.  Needs in hospital IV antibiotics until 6/21   Dispo: The patient is from: Home  Anticipated d/c is to: Home              Anticipated d/c date is: > 3 days              Patient currently is not medically stable to d/c.        Consultants:   ID   Procedures: MRI  cervical spine, 2D echo   Antimicrobials: IV nafcillin   Subjective: Seen and examined.  Maintaining sats on room air.  CBGs in the 80s.  Objective: Vitals:   04/05/20 0811 04/05/20 0900 04/05/20 0915 04/05/20 1243  BP:   (!) 146/95 (!) 167/98  Pulse:  95 91   Resp:  16 17   Temp:      TempSrc:      SpO2: 100% 96% 99%   Weight:      Height:        Intake/Output Summary (Last 24 hours) at 04/05/2020 1247 Last data filed at 04/05/2020 1200 Gross per 24 hour  Intake --  Output 2800 ml  Net -2800 ml   Filed Weights   03/13/20 1654 03/13/20 2055 04/01/20 2230  Weight: 59 kg 60.3 kg 70.3 kg    Examination:  General: Not in distress HEENT: Moist with a, supple Chest: Clear CVs: Normal S1-S2 GI: Soft, nondistended, nontender Musculoskeletal: Warm, no edema    Data Reviewed: I have personally reviewed following labs and imaging studies  CBC: Recent Labs  Lab 03/30/20 0344 03/31/20 0413 04/01/20 2238 04/05/20 0348  WBC 9.3 9.8 10.4 8.0  HGB 9.5* 9.1* 10.1* 9.4*  HCT 28.6* 28.3* 31.7* 29.0*  MCV 89.9 92.8 93.0 91.5  PLT 590* 552* 506* 364    Basic Metabolic Panel: Recent Labs  Lab 03/30/20 0344 03/30/20 0344 03/31/20 0413 04/01/20 2238 04/02/20 0523 04/03/20 0506 04/05/20 0348  NA 139  --  141 138  --  141 140  K 4.2  --  3.8 4.2  --  4.0 3.9  CL 109  --  113* 109  --  110 109  CO2 23  --  20* 23  --  25 24  GLUCOSE 193*  --  98 341*  --  109* 169*  BUN 34*  --  26* 29*  --  31* 28*  CREATININE 0.62   < > 0.63 0.85 0.66 0.62 0.55*  CALCIUM 8.3*  --  8.3* 8.3*  --  8.2* 8.1*   < > = values in this interval not displayed.    GFR: Estimated Creatinine Clearance: 96.4 mL/min (A) (by C-G formula based on SCr of 0.55 mg/dL (L)).  Liver Function Tests: Recent Labs  Lab 04/01/20 2238 04/05/20 0348  AST 13* 13*  ALT 13 11  ALKPHOS 86 77  BILITOT 0.7 0.7  PROT 6.5 5.7*  ALBUMIN 2.2* 2.0*    CBG: Recent Labs  Lab 04/04/20 2054  04/05/20 0020 04/05/20 0351 04/05/20 0716 04/05/20 1105  GLUCAP 211* 204* 152* 104* 87     Recent Results (from the past 240 hour(s))  Culture, blood (Routine X 2) w Reflex to ID Panel     Status: None (Preliminary result)   Collection Time: 04/01/20 10:38 PM   Specimen: BLOOD LEFT HAND  Result Value Ref Range Status   Specimen Description BLOOD LEFT HAND  Final   Special Requests   Final    BOTTLES DRAWN AEROBIC AND ANAEROBIC Blood Culture adequate volume   Culture   Final    NO GROWTH 4 DAYS Performed at  [facility], [address] [place], [place] [address]    Report Status PENDING  Incomplete  Culture, blood (Routine X 2) w Reflex to ID Panel     Status: None (Preliminary result)   Collection Time: 04/01/20 10:38 PM   Specimen: Left Antecubital; Blood  Result Value Ref Range Status   Specimen Description LEFT ANTECUBITAL  Final   Special Requests   Final    BOTTLES DRAWN AEROBIC AND ANAEROBIC Blood Culture adequate volume   Culture   Final    NO GROWTH 4 DAYS Performed at [facility], [address] [place], [place] [address]    Report Status PENDING  Incomplete  MRSA PCR Screening     Status: None   Collection Time: 04/01/20 11:20 PM   Specimen: Nasopharyngeal  Result Value Ref Range Status   MRSA by PCR NEGATIVE NEGATIVE Final    Comment:        The GeneXpert MRSA Assay (FDA approved for NASAL specimens only), is one component of a comprehensive MRSA colonization surveillance program. It is not intended to diagnose MRSA infection nor to guide or monitor treatment for MRSA infections. Performed at [facility], [address], [place], [place] [address]          Radiology Studies: DG Chest [place] 1 View  Result Date: 04/05/2020 CLINICAL DATA:  Hypoxia EXAM: PORTABLE CHEST 1 VIEW COMPARISON:  04/01/2020 FINDINGS: Persistent bilateral opacities with overall improved aeration compared to the prior study. New small left  pleural effusion with associated atelectasis. Stable cardiomediastinal contours. No pneumothorax. IMPRESSION: Improved aeration compared to 04/01/2020 with persistent opacities probably reflecting pulmonary edema. New small left pleural effusion with associated atelectasis. Electronically Signed   By: Guadlupe Spanish M.D.   On: 04/05/2020 07:38        Scheduled Meds: . amLODipine  10 mg Oral Daily  . aspirin EC  81 mg Oral Daily  . budesonide (PULMICORT) nebulizer solution  0.25 mg Nebulization BID  . chlorhexidine  15 mL Mouth Rinse BID  . Chlorhexidine Gluconate Cloth  6 each Topical Daily  . clonazePAM  0.5 mg Oral BID  . DULoxetine  30 mg Oral Daily  . enoxaparin (LOVENOX) injection  40 mg Subcutaneous Q24H  . famotidine  20 mg Oral Daily  . feeding supplement (GLUCERNA SHAKE)  237 mL Oral BID BM  . furosemide  20 mg Oral BID  . haloperidol  0.25 mg Oral BID  . insulin aspart  0-15 Units Subcutaneous TID WC  . insulin aspart  0-5 Units Subcutaneous QHS  . insulin aspart  16 Units Subcutaneous Q lunch  . insulin aspart  16 Units Subcutaneous Q breakfast  . insulin aspart  8 Units Subcutaneous Q supper  . insulin glargine  32 Units Subcutaneous Daily  . ipratropium-albuterol  3 mL Nebulization Q6H  . losartan  50 mg Oral Daily  . mouth rinse  15 mL Mouth Rinse q12n4p  . methocarbamol  500 mg Oral TID  . multivitamin with minerals  1 tablet Oral Daily  . OLANZapine  5 mg Oral QHS  . polyethylene glycol  17 g Oral Daily  . predniSONE  60 mg Oral Q breakfast   Continuous Infusions: . sodium chloride 20.8 mL/hr at 04/01/20 0938  . nafcillin (NAFCIL) continuous infusion 12 g (04/05/20 1242)     LOS: 23 days    Time spent: 25 minutes    Helaina Stefano, MD [facility]   To contact the attending  provider between 7A-7P or the covering provider during after hours 7P-7A, please log into the web site [email or web address] and access using universal [place] password for  that web site. If you do not have the password, please call the hospital operator.  04/05/2020, 12:47 PM

## 2020-04-05 NOTE — Progress Notes (Signed)
   04/05/20 1145  Clinical Encounter Type  Visited With Patient  Visit Type Initial  Referral From Chaplain  Consult/Referral To Chaplain  When chaplain entered the room, patient told her that he needed a nurse because he needed to use the bathroom. Chaplain attempted to get someone to assist patient and told him that she would come back.

## 2020-04-05 NOTE — Progress Notes (Signed)
Pt noted to have course lung sounds with history of flash pulmonary edema.  MD notified, received order for one time IV lasix dose and modified daily scheduled doses.  Administered Lasix, will continue to monitor.

## 2020-04-06 ENCOUNTER — Inpatient Hospital Stay (HOSPITAL_COMMUNITY)
Admit: 2020-04-06 | Discharge: 2020-04-06 | Disposition: A | Discharge status: Home / Self Care | Payer: Medicaid Other | Attending: Internal Medicine | Admitting: Internal Medicine

## 2020-04-06 DIAGNOSIS — J810 Acute pulmonary edema: Secondary | ICD-10-CM

## 2020-04-06 LAB — CULTURE, BLOOD (ROUTINE X 2)
Culture: NO GROWTH
Culture: NO GROWTH
Special Requests: ADEQUATE
Special Requests: ADEQUATE

## 2020-04-06 LAB — ECHOCARDIOGRAM COMPLETE
Height: 70 in
Weight: 2479.73 oz

## 2020-04-06 LAB — GLUCOSE, CAPILLARY
Glucose-Capillary: 127 mg/dL — ABNORMAL HIGH (ref 70–99)
Glucose-Capillary: 223 mg/dL — ABNORMAL HIGH (ref 70–99)
Glucose-Capillary: 291 mg/dL — ABNORMAL HIGH (ref 70–99)
Glucose-Capillary: 297 mg/dL — ABNORMAL HIGH (ref 70–99)
Glucose-Capillary: 303 mg/dL — ABNORMAL HIGH (ref 70–99)
Glucose-Capillary: 355 mg/dL — ABNORMAL HIGH (ref 70–99)
Glucose-Capillary: 391 mg/dL — ABNORMAL HIGH (ref 70–99)

## 2020-04-06 MED ORDER — INSULIN ASPART 100 UNIT/ML ~~LOC~~ SOLN
12.0000 [IU] | Freq: Three times a day (TID) | SUBCUTANEOUS | Status: DC
  Administered 2020-04-06 – 2020-04-23 (×45): 12 [IU] via SUBCUTANEOUS
  Filled 2020-04-06 (×45): qty 1

## 2020-04-06 NOTE — Progress Notes (Signed)
ID Pt with recurrent flash pulmonary edema In ICU Doing better today Patient Vitals for the past 24 hrs:  BP Temp Temp src Pulse Resp SpO2  04/06/20 2041 -- -- -- -- -- 95 %  04/06/20 2000 (!) 144/83 98.4 F (36.9 C) Axillary (!) 102 20 91 %  04/06/20 1900 134/83 -- -- 87 17 97 %  04/06/20 1800 (!) 146/92 -- -- 92 14 94 %  04/06/20 1700 (!) 150/103 -- -- (!) 101 17 98 %  04/06/20 1600 (!) 136/94 -- -- 83 17 95 %  04/06/20 1500 (!) 133/94 -- -- 93 17 94 %  04/06/20 1400 (!) 153/87 98.9 F (37.2 C) Axillary 78 15 99 %  04/06/20 1300 (!) 146/105 -- -- 100 10 96 %  04/06/20 1200 (!) 150/82 -- -- 72 14 97 %  04/06/20 1100 (!) 153/105 -- -- 74 14 98 %  04/06/20 1000 (!) 172/94 -- -- 90 14 97 %  04/06/20 0900 (!) 162/96 -- -- 91 16 96 %  04/06/20 0800 (!) 162/108 98.6 F (37 C) Oral 91 16 98 %  04/06/20 0731 -- -- -- -- -- 97 %  04/06/20 0700 (!) 193/124 -- -- (!) 105 16 97 %  04/06/20 0600 (!) 161/104 -- -- 93 17 97 %  04/06/20 0500 (!) 166/91 -- -- 81 18 96 %  04/06/20 0400 (!) 172/105 -- -- 90 16 97 %  04/06/20 0300 (!) 167/103 -- -- 95 17 95 %  04/06/20 0200 (!) 148/97 98.5 F (36.9 C) -- 93 17 96 %  04/06/20 0100 (!) 154/97 -- -- 90 18 97 %  04/06/20 0000 (!) 148/91 -- -- 94 18 93 %  04/05/20 2300 (!) 151/82 -- -- 95 12 96 %  04/05/20 2200 (!) 145/89 -- -- 96 (!) 21 97 %   Awake, no distress Chest b/l air entry [id number] abd soft Ankle no edema  Labs CBC Latest Ref Rng & Units 04/05/2020 04/01/2020 03/31/2020  WBC 4.0 - 10.5 K/uL 8.0 10.4 9.8  Hemoglobin 13.0 - 17.0 g/dL 9.4(L) 10.1(L) 9.1(L)  Hematocrit 39.0 - 52.0 % 29.0(L) 31.7(L) 28.3(L)  Platelets 150 - 400 K/uL 364 506(H) 552(H)    CMP Latest Ref Rng & Units 04/05/2020 04/03/2020 04/02/2020  Glucose 70 - 99 mg/dL 169(H) 109(H) -  BUN 8 - 23 mg/dL 28(H) 31(H) -  Creatinine 0.61 - 1.24 mg/dL 0.55(L) 0.62 0.66  Sodium 135 - 145 mmol/L 140 141 -  Potassium 3.5 - 5.1 mmol/L 3.9 4.0 -  Chloride 98 - 111 mmol/L 109 110 -   CO2 22 - 32 mmol/L 24 25 -  Calcium 8.9 - 10.3 mg/dL 8.1(L) 8.2(L) -  Total Protein 6.5 - 8.1 g/dL 5.7(L) - -  Total Bilirubin 0.3 - 1.2 mg/dL 0.7 - -  Alkaline Phos 38 - 126 U/L 77 - -  AST 15 - 41 U/L 13(L) - -  ALT 0 - 44 U/L 11 - -    Impression?recommendation  CHF- pulmonary edema- because of recurrence need to r/o valvular dysfunction because of MRSA infection ECHO repeated  Staph aureus bacteremia with extensive vertebral infection in a patient who was using cocaine. MRI cervical spine shows abnormal marrow signal concerning for osteomyelitis of the C1, C2, C4, C5, T2 and T3 vertebral bodies.  There is ventral epidural phlegmon extending from C2 to C4-C5.  Dorsal epidural phlegmon extending from C3-C4 through C6-C7.  No discrete epidural abscess. Currently on nafcillin.  blood cultures from  03/15/2020 and 5/27  negative for MSSA.  Would recommend reevaluation by neurosurgery in the light of progression of MRI findings on appropriate antibiotics.  Anemia   History of cocaine use History of leaving AMA twice before Bipolar disorder Seen by psychiatrist.  Discussed the management with Hospitalist

## 2020-04-06 NOTE — Progress Notes (Signed)
Pt is resting in bed with no complaints and in no distress. Pt has been very pleasant and cooperative today.  VSS on room air. BP has been slightly elevated today. Sister has visited for a while.

## 2020-04-06 NOTE — Progress Notes (Signed)
*  PRELIMINARY RESULTS* Echocardiogram 2D Echocardiogram has been performed.  Cristela Blue 04/06/2020, 9:27 AM

## 2020-04-06 NOTE — Progress Notes (Signed)
PROGRESS NOTE    Spencer [name]  AOZ:[id number] DOB: [birth year] DOA: 03/13/2020 PCP: Patient, No Pcp Per    Chief Complaint  Patient presents with  . Hyperglycemia    Brief Narrative:  59 year old male with history of uncontrolled type 2 diabetes mellitus, cocaine use, brain aneurysm, hypertension, GERD recently hospitalized on 4/28 for DKA, MSSA bacteremia and AKI but signed out AMA on 5/5 was brought by EMS with hyperglycemia and encephalopathy. In the ED was found to be in DKA with high anion gap of 30. Given aggressive IV hydration and insulin drip with resolution of DKA. Hospital course prolonged due to presence of recurrent MSSA bacteremia.   DKA resolved.. The patient will need to complete 6 weeks of IV nafcillin prior to discharge per ID.  Last day of therapy would be 04/26/2020.  On the evening of 04/03/2020 the patient developed acute flash pulmonary edema. He was transferred to the ICU and given diuresis and placed on BIPAP.  He had similar presentation on 5/27 requiring transfer to ICU.   Assessment & Plan:   Principal Problem:   MSSA bacteremia,, persistent Patient left AMA during last hospital stay.  Blood culture on admission (5/8) positive for MSSA.  2D echo unremarkable.  Refused TEE, although was cleared by neurosurgery for rate regarding his C-spine fracture. Repeat blood culture on 5/10, negative. IV nafcillin for total 6 weeks (until 04/26/2020).  Needs to be treated for full course of antibiotic in the hospital.   Active Problems: Acute pulmonary edema Unclear etiology.  2D echo from 5/8 with normal EF and no valvular abnormality.  Patient refused TEE. Monitor strict I's/O. Blood pressure currently stable.  As needed IV Lasix.  Also receiving intermittent steroid regarding his bronchospasm. Ordered repeat 2D echo to rule out new valvular impairment given recurrent symptoms int eh apst week.   Cervical and thoracic spine osteomyelitis Seen on MRI of the  cervical spine from 5/27 (done with concern for myositis on MRI of the left shoulder). Showed progressive perivertebral soft tissue swelling with concern for osteomyelitis of C1-C5, T2 and T3 vertebral bodies.  Also has ventral epidural phlegmon extending from C2-C5 and dorsal epidural phlegmon extending from C3-C4-6 6-C7, without discrete epidural abscess, high-grade stenosis or cord signal abnormality.  Also has an acute to subacute mild T3 superior endplate compression fracture. ID recommends no escalation of antibiotic.  Have messaged Dr Adriana Simas (neurosurgery)  to evaluate for any surgical intervention needed..  Diabetes mellitus type 2, poorly controlled with hyper and hypoglycemia A1c of 12.6.  Presented with recurrent DKA which has resolved.  Currently on Lantus with Premeal aspart and sliding scale coverage.  Needs close CBG monitoring.  Minimally displaced C4 fracture Recommendation to wear Aspen collar at all times per patient refusing.  Outpatient follow-up with neurosurgery Patsey Berthold, NP).  Essential hypertension Stable.  Continue amlodipine and losartan.  Ongoing cocaine use Urine drug screen positive this admission.  He reportedly was getting agitated past 2 days and per sister he was supposed to receive paycheck with which he could buy some drugs.  On as needed clonazepam and Haldol.  Acute kidney injury Prerenal secondary to DKA.  Resolved.  Issues with decision-making capacity Seen by psychiatry multiple times this admission and has capacity.  Cannot be involuntarily committed if he decides to leave AMA.     DVT prophylaxis: Subcu Lovenox Code Status: Full code Family Communication:will update his sister Disposition:   Status is: Inpatient  Remains inpatient appropriate because:Unsafe d/c plan.  Needs in hospital IV antibiotics until 6/21   Dispo: The patient is from: Home              Anticipated d/c is to: Home              Anticipated d/c date is: > 3  days              Patient currently is not medically stable to d/c.        Consultants:   ID   Procedures: MRI cervical spine, 2D echo   Antimicrobials: IV nafcillin   Subjective: Seen and examined. No overnight events. sats stable  Objective: Vitals:   04/06/20 0800 04/06/20 0900 04/06/20 1000 04/06/20 1100  BP: (!) 162/108 (!) 162/96 (!) 172/94 (!) 153/105  Pulse: 91 91 90 74  Resp: 16 16 14 14   Temp: 98.6 F (37 C)     TempSrc: Oral     SpO2: 98% 96% 97% 98%  Weight:      Height:        Intake/Output Summary (Last 24 hours) at 04/06/2020 1206 Last data filed at 04/06/2020 1152 Gross per 24 hour  Intake 1276.88 ml  Output 2525 ml  Net -1248.12 ml   Filed Weights   03/13/20 1654 03/13/20 2055 04/01/20 2230  Weight: 59 kg 60.3 kg 70.3 kg   Physical exam Not in distress HEENT: Moist mucosa Chest: Clear CVs: Normal S1-S2, no murmurs GI: Soft, nontender, nondistended Musculoskeletal: Warm, no edema    Data Reviewed: I have personally reviewed following labs and imaging studies  CBC: Recent Labs  Lab 03/31/20 0413 04/01/20 2238 04/05/20 0348  WBC 9.8 10.4 8.0  HGB 9.1* 10.1* 9.4*  HCT 28.3* 31.7* 29.0*  MCV 92.8 93.0 91.5  PLT 552* 506* 245    Basic Metabolic Panel: Recent Labs  Lab 03/31/20 0413 04/01/20 2238 04/02/20 0523 04/03/20 0506 04/05/20 0348  NA 141 138  --  141 140  K 3.8 4.2  --  4.0 3.9  CL 113* 109  --  110 109  CO2 20* 23  --  25 24  GLUCOSE 98 341*  --  109* 169*  BUN 26* 29*  --  31* 28*  CREATININE 0.63 0.85 0.66 0.62 0.55*  CALCIUM 8.3* 8.3*  --  8.2* 8.1*    GFR: Estimated Creatinine Clearance: 96.4 mL/min (A) (by C-G formula based on SCr of 0.55 mg/dL (L)).  Liver Function Tests: Recent Labs  Lab 04/01/20 2238 04/05/20 0348  AST 13* 13*  ALT 13 11  ALKPHOS 86 77  BILITOT 0.7 0.7  PROT 6.5 5.7*  ALBUMIN 2.2* 2.0*    CBG: Recent Labs  Lab 04/05/20 2107 04/06/20 0058 04/06/20 0415 04/06/20 0714  04/06/20 1111  GLUCAP 233* 223* 297* 291* 127*     Recent Results (from the past 240 hour(s))  Culture, blood (Routine X 2) w Reflex to ID Panel     Status: None   Collection Time: 04/01/20 10:38 PM   Specimen: BLOOD LEFT HAND  Result Value Ref Range Status   Specimen Description BLOOD LEFT HAND  Final   Special Requests   Final    BOTTLES DRAWN AEROBIC AND ANAEROBIC Blood Culture adequate volume   Culture   Final    NO GROWTH 5 DAYS Performed at [facility], [address] [place], [place] [address]    Report Status 04/06/2020 FINAL  Final  Culture, blood (Routine X 2) w Reflex to ID Panel  Status: None   Collection Time: 04/01/20 10:38 PM   Specimen: Left Antecubital; Blood  Result Value Ref Range Status   Specimen Description LEFT ANTECUBITAL  Final   Special Requests   Final    BOTTLES DRAWN AEROBIC AND ANAEROBIC Blood Culture adequate volume   Culture   Final    NO GROWTH 5 DAYS Performed at [facility], [address] [place], [place] [address]    Report Status 04/06/2020 FINAL  Final  MRSA PCR Screening     Status: None   Collection Time: 04/01/20 11:20 PM   Specimen: Nasopharyngeal  Result Value Ref Range Status   MRSA by PCR NEGATIVE NEGATIVE Final    Comment:        The GeneXpert MRSA Assay (FDA approved for NASAL specimens only), is one component of a comprehensive MRSA colonization surveillance program. It is not intended to diagnose MRSA infection nor to guide or monitor treatment for MRSA infections. Performed at [facility], [address] [place], [place] [address]          Radiology Studies: DG Chest [place] 1 View  Result Date: 04/05/2020 CLINICAL DATA:  Hypoxia EXAM: PORTABLE CHEST 1 VIEW COMPARISON:  04/01/2020 FINDINGS: Persistent bilateral opacities with overall improved aeration compared to the prior study. New small left pleural effusion with associated atelectasis. Stable cardiomediastinal contours.  No pneumothorax. IMPRESSION: Improved aeration compared to 04/01/2020 with persistent opacities probably reflecting pulmonary edema. New small left pleural effusion with associated atelectasis. Electronically Signed   By: Guadlupe Spanish M.D.   On: 04/05/2020 07:38        Scheduled Meds: . amLODipine  10 mg Oral Daily  . aspirin EC  81 mg Oral Daily  . budesonide (PULMICORT) nebulizer solution  0.25 mg Nebulization BID  . chlorhexidine  15 mL Mouth Rinse BID  . Chlorhexidine Gluconate Cloth  6 each Topical Daily  . clonazePAM  0.5 mg Oral BID  . DULoxetine  30 mg Oral Daily  . enoxaparin (LOVENOX) injection  40 mg Subcutaneous Q24H  . famotidine  20 mg Oral Daily  . feeding supplement (GLUCERNA SHAKE)  237 mL Oral BID BM  . furosemide  40 mg Oral BID  . haloperidol  0.25 mg Oral BID  . insulin aspart  0-15 Units Subcutaneous TID WC  . insulin aspart  0-5 Units Subcutaneous QHS  . insulin aspart  12 Units Subcutaneous TID WC  . insulin glargine  32 Units Subcutaneous Daily  . ipratropium-albuterol  3 mL Nebulization Q6H  . losartan  50 mg Oral Daily  . mouth rinse  15 mL Mouth Rinse q12n4p  . methocarbamol  500 mg Oral TID  . multivitamin with minerals  1 tablet Oral Daily  . OLANZapine  5 mg Oral QHS  . polyethylene glycol  17 g Oral Daily  . predniSONE  60 mg Oral Q breakfast   Continuous Infusions: . sodium chloride 20.8 mL/hr at 04/01/20 0938  . nafcillin (NAFCIL) continuous infusion 20.8 mL/hr at 04/06/20 1152     LOS: 24 days    Time spent: 25 minutes    Lino Wickliff, MD [facility]   To contact the attending provider between 7A-7P or the covering provider during after hours 7P-7A, please log into the web site [email or web address] and access using universal [place] password for that web site. If you do not have the password, please call the hospital operator.  04/06/2020, 12:06 PM

## 2020-04-06 NOTE — Progress Notes (Signed)
Report called to [employer], Charity fundraiser. Pt transported via transport team. All belongings sent with patient.

## 2020-04-06 NOTE — Progress Notes (Signed)
Inpatient Diabetes Program Recommendations  AACE/ADA: New Consensus Statement on Inpatient Glycemic Control (2015)  Target Ranges:  Prepandial:   less than 140 mg/dL      Peak postprandial:   less than 180 mg/dL (1-2 hours)      Critically ill patients:  140 - 180 mg/dL   Lab Results  Component Value Date   GLUCAP 127 (H) 04/06/2020   HGBA1C 12.6 (H) 02/15/2020    Review of Glycemic Control  Inpatient Diabetes Program Recommendations:  Spoke with pharmacist Thereasa Distance concerning adjustment in insulin doses. Insulin - Meal Coverage: Please consider increasing meal coverage to Novolog 12 units TID with meals if patient eats at least 50% of meals.  Thank you, Billy Fischer. Mihira Tozzi, RN, MSN, CDE  Diabetes Coordinator Inpatient Glycemic Control Team Team Pager [phone] (8am-5pm) 04/06/2020 11:55 AM

## 2020-04-07 LAB — BASIC METABOLIC PANEL
Anion gap: 8 (ref 5–15)
BUN: 32 mg/dL — ABNORMAL HIGH (ref 8–23)
CO2: 25 mmol/L (ref 22–32)
Calcium: 8.3 mg/dL — ABNORMAL LOW (ref 8.9–10.3)
Chloride: 109 mmol/L (ref 98–111)
Creatinine, Ser: 0.87 mg/dL (ref 0.61–1.24)
GFR calc Af Amer: >60 mL/min (ref >60)
GFR calc non Af Amer: >60 mL/min (ref >60)
Glucose, Bld: 287 mg/dL — ABNORMAL HIGH (ref 70–99)
Potassium: 3.9 mmol/L (ref 3.5–5.1)
Sodium: 142 mmol/L (ref 135–145)

## 2020-04-07 LAB — GLUCOSE, CAPILLARY
Glucose-Capillary: 223 mg/dL — ABNORMAL HIGH (ref 70–99)
Glucose-Capillary: 125 mg/dL — ABNORMAL HIGH (ref 70–99)
Glucose-Capillary: 62 mg/dL — ABNORMAL LOW (ref 70–99)
Glucose-Capillary: 539 mg/dL (ref 70–99)
Glucose-Capillary: 495 mg/dL — ABNORMAL HIGH (ref 70–99)
Glucose-Capillary: 339 mg/dL — ABNORMAL HIGH (ref 70–99)
Glucose-Capillary: 123 mg/dL — ABNORMAL HIGH (ref 70–99)
Glucose-Capillary: 127 mg/dL — ABNORMAL HIGH (ref 70–99)

## 2020-04-07 LAB — SEDIMENTATION RATE: Sed Rate: 107 mm/hr — ABNORMAL HIGH (ref 0–20)

## 2020-04-07 MED ORDER — INSULIN ASPART 100 UNIT/ML ~~LOC~~ SOLN
10.0000 [IU] | Freq: Once | SUBCUTANEOUS | Status: AC
  Administered 2020-04-07: 10 [IU] via SUBCUTANEOUS
  Filled 2020-04-07: qty 1

## 2020-04-07 MED ORDER — IPRATROPIUM-ALBUTEROL 0.5-2.5 (3) MG/3ML IN SOLN: 3.0000 mL | Freq: Four times a day (QID) | RESPIRATORY_TRACT | Status: DC | PRN

## 2020-04-07 MED ORDER — SODIUM CHLORIDE 0.9 % IV SOLN: 12.0000 g | INTRAVENOUS | Status: DC

## 2020-04-07 NOTE — Progress Notes (Signed)
Rechecked blood sugar CBG 495, patient in room sleeping, has not ate his dinner tray, Dr. Denton Lank notified received verbal orders for 10 units of Novolog. Will continue to monitor.

## 2020-04-07 NOTE — Progress Notes (Signed)
CRITICAL VALUE ALERT  Critical Value:  CBG 539   Date & Time Notied:  04/07/20.1700  Provider Notified: Dr. Melene Muller  Orders Received/Actions taken: give 27 units of novolog as schedule.

## 2020-04-07 NOTE — Hospital Course (Signed)
59 year old male with history of uncontrolled type 2 diabetes mellitus, cocaine use, brain aneurysm, hypertension, GERD recently hospitalized on 4/28 for DKA, MSSA bacteremia and AKI but signed out AMA on 5/5 was brought by EMS with hyperglycemia and encephalopathy.  In the ED was found to be in DKA with high anion gap of 30.  Given aggressive IV hydration and insulin drip with resolution of DKA.  Hospital course prolonged due to presence of recurrent MSSA bacteremia.    DKA resolved.. The patient will need to complete 6 weeks of IV nafcillin prior to discharge per ID.  Last day of therapy would be 04/26/2020.   On the evening of 04/03/2020 the patient developed acute flash pulmonary edema. He was transferred to the ICU and given diuresis and placed on BIPAP.  He had similar presentation on 5/27 requiring transfer to ICU.

## 2020-04-07 NOTE — Progress Notes (Signed)
PROGRESS NOTE    Spencer [name]   CVE:[id number]  DOB: 10/02/1961  PCP: McLean-Scocuzza, Pasty Spillers, MD    DOA: 03/13/2020 LOS: 31   Brief Narrative   59 year old male with history of uncontrolled type 2 diabetes mellitus, cocaine use, brain aneurysm, hypertension, GERD recently hospitalized on 4/28 for DKA, MSSA bacteremia and AKI but signed out AMA on 5/5 was brought by EMS with hyperglycemia and encephalopathy.  In the ED was found to be in DKA with high anion gap of 30.  Given aggressive IV hydration and insulin drip with resolution of DKA.  Hospital course prolonged due to presence of recurrent MSSA bacteremia.    DKA resolved.. The patient will need to complete 6 weeks of IV nafcillin prior to discharge per ID.  Last day of therapy would be 04/26/2020.   On the evening of 04/03/2020 the patient developed acute flash pulmonary edema. He was transferred to the ICU and given diuresis and placed on BIPAP.  He had similar presentation on 5/27 requiring transfer to ICU.    Assessment & Plan   Principal Problem:   MSSA bacteremia Active Problems:   DKA (diabetic ketoacidoses) (HCC)   Hypertension   Diabetic neuropathy (HCC)   DM2 (diabetes mellitus, type 2) (HCC)   History of cocaine abuse (HCC)   Personal history of subdural hematoma   AKI (acute kidney injury) (HCC)   GERD (gastroesophageal reflux disease)   C4 cervical fracture (HCC)   Cigarette smoker   Pressure injury of skin   MSSA bacteremia, persistent Patient left AMA during last hospital stay.  Blood culture on admission (5/8) positive for MSSA. 2D echo unremarkable.  Refused TEE, although was cleared by neurosurgery for rate regarding his C-spine fracture.  Repeat blood culture on 5/10, negative. --IV nafcillin for total 6 weeks (until 04/26/2020).   --Needs to be treated for full course of antibiotic in the hospital.   Acute pulmonary edema - resolved Unclear etiology.  2D echo from 5/8 with normal EF and no valvular  abnormality.  Patient refused TEE.  Repeated 2D echo on 6/1 which did not show any new valvular issues, EF 55-60%.   --Monitor strict I's/O.   --As needed IV Lasix.    Cervical and thoracic spine osteomyelitis Seen on MRI of the cervical spine from 5/27 (done with concern for myositis on MRI of the left shoulder).  Showed progressive perivertebral soft tissue swelling with concern for osteomyelitis of C1-C5, T2 and T3 vertebral bodies.  Also has ventral epidural phlegmon extending from C2-C5 and dorsal epidural phlegmon extending from C3-C4-6 6-C7, without discrete epidural abscess, high-grade stenosis or cord signal abnormality.  Also has an acute to subacute mild T3 superior endplate compression fracture. --ID recommends no escalation of antibiotic.   --Neurosurgery consulted, following - will consider open biopsy if persistent despite antibiotics  Diabetes mellitus type 2, poorly controlled with hyper and hypoglycemia A1c of 12.6.  Presented with recurrent DKA which has resolved.  Currently on Lantus with Premeal aspart and sliding scale coverage.  Needs close CBG monitoring.  Minimally displaced C4 fracture Recommendation to wear Aspen collar at all times but patient refusing.   Outpatient follow-up with neurosurgery Patsey Berthold, NP).  Essential hypertension Stable.  Continue amlodipine and losartan.  Ongoing cocaine use Urine drug screen positive this admission.  He reportedly was getting agitated past 2 days and per sister he was supposed to receive paycheck with which he could buy some drugs.  On as needed clonazepam and Haldol.  Acute kidney injury Prerenal secondary to DKA.  Resolved.  Issues with decision-making capacity Seen by psychiatry multiple times this admission and has capacity.  Cannot be involuntarily committed if he decides to leave AMA.   Patient BMI: Body mass index is 22.24 kg/m.   DVT prophylaxis: Lovenox  Diet:  Diet Orders (From admission,  onward)    Start     Ordered   03/15/20 1440  Diet Carb Modified Fluid consistency: Thin; Room service appropriate? Yes with Assist  Diet effective now    Question Answer Comment  Diet-HS Snack? Nothing   Calorie Level Medium 1600-2000   Fluid consistency: Thin   Room service appropriate? Yes with Assist      03/15/20 1439            Code Status: Full Code    Subjective 04/07/20    Patient seen at bedside this AM.  Says his neck hurts.  No fevers or chills.  No other acute complaints.   Disposition Plan & Communication   Status is: Inpatient  Remains inpatient appropriate because:IV treatments appropriate due to intensity of illness or inability to take PO   Dispo: The patient is from: Home              Anticipated d/c is to: Home              Anticipated d/c date is: > 3 days              Patient currently is not medically stable to d/c.   Family Communication: none at bedside, will attempt to call    Consults, Procedures, Significant Events   Consultants:   Neurosurgery  Infectious Disease  Cardiology  Procedures:   MRI Cervical Spine  2D Echo  Antimicrobials:   IV Nafcillin  5/21 >>    Objective   Vitals:   04/06/20 2300 04/06/20 2332 04/07/20 0537 04/07/20 0853  BP: (!) 167/92 (!) 153/102 140/90 136/76  Pulse: 89 93 95 86  Resp: 19 19 19    Temp:  97.9 F (36.6 C) 97.7 F (36.5 C)   TempSrc:  Oral Axillary   SpO2: 90% 97% 98% 96%  Weight:      Height:        Intake/Output Summary (Last 24 hours) at 04/07/2020 1323 Last data filed at 04/07/2020 0900 Gross per 24 hour  Intake 169.21 ml  Output 1950 ml  Net -1780.79 ml   Filed Weights   03/13/20 1654 03/13/20 2055 04/01/20 2230  Weight: 59 kg 60.3 kg 70.3 kg    Physical Exam:  General exam: awake, alert, no acute distress HEENT: moist mucus membranes, hearing grossly normal  Respiratory system: CTAB, no wheezes, rales or rhonchi, normal respiratory effort. Cardiovascular system:  normal S1/S2, RRR, no pedal edema.   Central nervous system: A&O x4. no gross focal neurologic deficits, normal speech Extremities: moves all, no cyanosis, normal tone Psychiatry: normal mood, congruent affect  Labs   Data Reviewed: I have personally reviewed following labs and imaging studies  CBC: Recent Labs  Lab 04/01/20 2238 04/05/20 0348  WBC 10.4 8.0  HGB 10.1* 9.4*  HCT 31.7* 29.0*  MCV 93.0 91.5  PLT 506* 364   Basic Metabolic Panel: Recent Labs  Lab 04/01/20 2238 04/02/20 0523 04/03/20 0506 04/05/20 0348 04/07/20 0408  NA 138  --  141 140 142  K 4.2  --  4.0 3.9 3.9  CL 109  --  110 109 109  CO2 23  --  25 24 25   GLUCOSE 341*  --  109* 169* 287*  BUN 29*  --  31* 28* 32*  CREATININE 0.85 0.66 0.62 0.55* 0.87  CALCIUM 8.3*  --  8.2* 8.1* 8.3*   GFR: Estimated Creatinine Clearance: 88.7 mL/min (by C-G formula based on SCr of 0.87 mg/dL). Liver Function Tests: Recent Labs  Lab 04/01/20 2238 04/05/20 0348  AST 13* 13*  ALT 13 11  ALKPHOS 86 77  BILITOT 0.7 0.7  PROT 6.5 5.7*  ALBUMIN 2.2* 2.0*   No results for input(s): LIPASE, AMYLASE in the last 168 hours. No results for input(s): AMMONIA in the last 168 hours. Coagulation Profile: No results for input(s): INR, PROTIME in the last 168 hours. Cardiac Enzymes: No results for input(s): CKTOTAL, CKMB, CKMBINDEX, TROPONINI in the last 168 hours. BNP (last 3 results) No results for input(s): PROBNP in the last 8760 hours. HbA1C: No results for input(s): HGBA1C in the last 72 hours. CBG: Recent Labs  Lab 04/06/20 1916 04/06/20 2018 04/07/20 0847 04/07/20 1158 04/07/20 1228  GLUCAP 391* 355* 223* 62* 125*   Lipid Profile: No results for input(s): CHOL, HDL, LDLCALC, TRIG, CHOLHDL, LDLDIRECT in the last 72 hours. Thyroid Function Tests: No results for input(s): TSH, T4TOTAL, FREET4, T3FREE, THYROIDAB in the last 72 hours. Anemia Panel: No results for input(s): VITAMINB12, FOLATE, FERRITIN,  TIBC, IRON, RETICCTPCT in the last 72 hours. Sepsis Labs: No results for input(s): PROCALCITON, LATICACIDVEN in the last 168 hours.  Recent Results (from the past 240 hour(s))  Culture, blood (Routine X 2) w Reflex to ID Panel     Status: None   Collection Time: 04/01/20 10:38 PM   Specimen: BLOOD LEFT HAND  Result Value Ref Range Status   Specimen Description BLOOD LEFT HAND  Final   Special Requests   Final    BOTTLES DRAWN AEROBIC AND ANAEROBIC Blood Culture adequate volume   Culture   Final    NO GROWTH 5 DAYS Performed at [facility], [address] [place], [place]    Report Status 04/06/2020 FINAL  Final  Culture, blood (Routine X 2) w Reflex to ID Panel     Status: None   Collection Time: 04/01/20 10:38 PM   Specimen: Left Antecubital; Blood  Result Value Ref Range Status   Specimen Description LEFT ANTECUBITAL  Final   Special Requests   Final    BOTTLES DRAWN AEROBIC AND ANAEROBIC Blood Culture adequate volume   Culture   Final    NO GROWTH 5 DAYS Performed at [facility], [address] [place], [place]    Report Status 04/06/2020 FINAL  Final  MRSA PCR Screening     Status: None   Collection Time: 04/01/20 11:20 PM   Specimen: Nasopharyngeal  Result Value Ref Range Status   MRSA by PCR NEGATIVE NEGATIVE Final    Comment:        The GeneXpert MRSA Assay (FDA approved for NASAL specimens only), is one component of a comprehensive MRSA colonization surveillance program. It is not intended to diagnose MRSA infection nor to guide or monitor treatment for MRSA infections. Performed at [facility], [address], [place], [place]       Imaging Studies   ECHOCARDIOGRAM COMPLETE  Result Date: 04/06/2020    ECHOCARDIOGRAM REPORT   Patient Name:   06/06/2020 Date of Exam: 04/06/2020 Medical Rec #:  06/06/2020    Height:  70.0 in Accession #:    [id number]   Weight:       155.0 lb Date of Birth:   04/10/1961     BSA:          1.873 m Patient Age:    4 years     BP:           193/124 mmHg Patient Gender: M            HR:           105 bpm. Exam Location:  [facility] Procedure: 2D Echo, Cardiac Doppler and Color Doppler Indications:     Acute respiratory insufficiency 518.82  History:         Patient has prior history of Echocardiogram examinations, most                  recent 03/14/2020. Risk Factors:Hypertension and Diabetes.                  Substance abuse.  Sonographer:     Sherrie Sport RDCS (AE) Referring Phys:  [id number] Louellen Molder Diagnosing Phys: Nelva Bush MD IMPRESSIONS  1. Left ventricular ejection fraction, by estimation, is 50 to 55%. The left ventricle has low normal function. The left ventricle has no regional wall motion abnormalities. There is mild left ventricular hypertrophy. Left ventricular diastolic parameters are consistent with Grade I diastolic dysfunction (impaired relaxation).  2. Right ventricular systolic function is normal. The right ventricular size is normal. Tricuspid regurgitation signal is inadequate for assessing PA pressure.  3. The mitral valve is normal in structure. Trivial mitral valve regurgitation.  4. The aortic valve was not well visualized. Aortic valve regurgitation is not visualized. No aortic stenosis is present.  5. Pulmonic valve regurgitation not well assessed.  6. The inferior vena cava is normal in size with greater than 50% respiratory variability, suggesting right atrial pressure of 3 mmHg. FINDINGS  Left Ventricle: Left ventricular ejection fraction, by estimation, is 50 to 55%. The left ventricle has low normal function. The left ventricle has no regional wall motion abnormalities. The left ventricular internal cavity size was normal in size. There is mild left ventricular hypertrophy. Left ventricular diastolic parameters are consistent with Grade I diastolic dysfunction (impaired relaxation). Right Ventricle: The right ventricular size is normal. No  increase in right ventricular wall thickness. Right ventricular systolic function is normal. Tricuspid regurgitation signal is inadequate for assessing PA pressure. Left Atrium: Left atrial size was normal in size. Right Atrium: Right atrial size was normal in size. Pericardium: There is no evidence of pericardial effusion. Mitral Valve: The mitral valve is normal in structure. Trivial mitral valve regurgitation. Tricuspid Valve: The tricuspid valve is not well visualized. Tricuspid valve regurgitation is not demonstrated. Aortic Valve: The aortic valve was not well visualized. Aortic valve regurgitation is not visualized. No aortic stenosis is present. Aortic valve mean gradient measures 4.0 mmHg. Aortic valve peak gradient measures 7.2 mmHg. Aortic valve area, by VTI measures 3.30 cm. Pulmonic Valve: The pulmonic valve was not well visualized. Pulmonic valve regurgitation not well assessed. Aorta: The aortic root is normal in size and structure. Pulmonary Artery: The pulmonary artery is not well seen. Venous: The inferior vena cava is normal in size with greater than 50% respiratory variability, suggesting right atrial pressure of 3 mmHg. IAS/Shunts: The interatrial septum was not well visualized. Additional Comments: There is pleural effusion in the left lateral region.  LEFT VENTRICLE PLAX 2D LVIDd:  4.42 cm      Diastology LVIDs:         3.47 cm      LV e' lateral:   7.62 cm/s LV PW:         1.36 cm      LV E/e' lateral: 9.8 LV IVS:        0.94 cm      LV e' medial:    7.29 cm/s LVOT diam:     2.30 cm      LV E/e' medial:  10.3 LV SV:         77 LV SV Index:   41 LVOT Area:     4.15 cm  LV Volumes (MOD) LV vol d, MOD A2C: 85.9 ml LV vol d, MOD A4C: 112.0 ml LV vol s, MOD A2C: 33.8 ml LV vol s, MOD A4C: 48.4 ml LV SV MOD A2C:     52.1 ml LV SV MOD A4C:     112.0 ml LV SV MOD BP:      56.9 ml RIGHT VENTRICLE RV Basal diam:  3.30 cm RV S prime:     18.80 cm/s TAPSE (M-mode): 3.8 cm LEFT ATRIUM              Index       RIGHT ATRIUM           Index LA diam:        3.70 cm 1.98 cm/m  RA Area:     18.40 cm LA Vol (A2C):   52.9 ml 28.24 ml/m RA Volume:   53.40 ml  28.51 ml/m LA Vol (A4C):   45.8 ml 24.45 ml/m LA Biplane Vol: 50.6 ml 27.02 ml/m  AORTIC VALVE                   PULMONIC VALVE AV Area (Vmax):    3.29 cm    PV Vmax:        1.11 m/s AV Area (Vmean):   3.74 cm    PV Peak grad:   4.9 mmHg AV Area (VTI):     3.30 cm    RVOT Peak grad: 8 mmHg AV Vmax:           134.00 cm/s AV Vmean:          91.950 cm/s AV VTI:            0.234 m AV Peak Grad:      7.2 mmHg AV Mean Grad:      4.0 mmHg LVOT Vmax:         106.00 cm/s LVOT Vmean:        82.800 cm/s LVOT VTI:          0.186 m LVOT/AV VTI ratio: 0.79  AORTA Ao Root diam: 3.50 cm MITRAL VALVE MV Area (PHT): 4.46 cm    SHUNTS MV Decel Time: 170 msec    Systemic VTI:  0.19 m MV E velocity: 75.00 cm/s  Systemic Diam: 2.30 cm MV A velocity: 96.80 cm/s MV E/A ratio:  0.77 Cristal De[place] End MD Electronically signed by Yvonne Kendall MD Signature Date/Time: 04/06/2020/5:51:45 PM    Final      Medications   Scheduled Meds: . amLODipine  10 mg Oral Daily  . aspirin EC  81 mg Oral Daily  . chlorhexidine  15 mL Mouth Rinse BID  . Chlorhexidine Gluconate Cloth  6 each Topical Daily  . clonazePAM  0.5 mg Oral BID  . DULoxetine  30 mg Oral Daily  . enoxaparin (LOVENOX) injection  40 mg Subcutaneous Q24H  . famotidine  20 mg Oral Daily  . feeding supplement (GLUCERNA SHAKE)  237 mL Oral BID BM  . furosemide  40 mg Oral BID  . haloperidol  0.25 mg Oral BID  . insulin aspart  0-15 Units Subcutaneous TID WC  . insulin aspart  0-5 Units Subcutaneous QHS  . insulin aspart  12 Units Subcutaneous TID WC  . insulin glargine  32 Units Subcutaneous Daily  . losartan  50 mg Oral Daily  . mouth rinse  15 mL Mouth Rinse q12n4p  . methocarbamol  500 mg Oral TID  . multivitamin with minerals  1 tablet Oral Daily  . OLANZapine  5 mg Oral QHS  . polyethylene glycol  17  g Oral Daily  . predniSONE  60 mg Oral Q breakfast   Continuous Infusions: . sodium chloride 20.8 mL/hr at 04/01/20 0938  . nafcillin (NAFCIL) continuous infusion 20.8 mL/hr at 04/06/20 2000       LOS: 25 days    Time spent: 30 minutes    Pennie BanterKelly A Keval Nam, DO [facility]  04/07/2020, 1:23 PM    If 7PM-7AM, please contact night-coverage. How to contact the [facility] Attending or Consulting provider 7A - 7P or covering provider during after hours 7P -7A, for this patient?    1. Check the care team in [facility] and look for a) attending/consulting TR[facility] provider listed and b) the [facility] team listed 2. Log into [email or web address] and use [place]'s universal password to access. If you do not have the password, please contact the hospital operator. 3. Locate the [facility] provider you are looking for under [facility] and page to a number that you can be directly reached. 4. If you still have difficulty reaching the provider, please page the [facility] (Director on Call) for the Hospitalists listed on amion for assistance.

## 2020-04-07 NOTE — Progress Notes (Signed)
Inpatient Diabetes Program Recommendations  AACE/ADA: New Consensus Statement on Inpatient Glycemic Control (2015)  Target Ranges:  Prepandial:   less than 140 mg/dL      Peak postprandial:   less than 180 mg/dL (1-2 hours)      Critically ill patients:  140 - 180 mg/dL   Lab Results  Component Value Date   GLUCAP 125 (H) 04/07/2020   HGBA1C 12.6 (H) 02/15/2020    Review of Glycemic Control Results for Spencer [name], Spencer [name] (MRN [id number]) as of 04/07/2020 14:53  Ref. Range 04/06/2020 19:16 04/06/2020 20:18 04/07/2020 08:47 04/07/2020 11:58 04/07/2020 12:28  Glucose-Capillary Latest Ref Range: 70 - 99 mg/dL 460 (H) 029 (H) 847 (H) 62 (L) 125 (H)   Inpatient Diabetes Program Recommendations:  Noted hypoglycemia post meal coverage + moderate correction. -Decrease Novolog correction to sensitive Secure chat sent to Dr. Denton Lank.  Thank you, Billy Fischer. Tarae Wooden, RN, MSN, CDE  Diabetes Coordinator Inpatient Glycemic Control Team Team Pager [phone] (8am-5pm) 04/07/2020 2:54 PM

## 2020-04-07 NOTE — Progress Notes (Signed)
Neurosurgery-New Consultation Evaluation 04/07/2020 CY BRESEE [id number]  Identifying Statement: Spencer [name] is a 59 y.o. male from [place] [id number] with known infection  Physician Requesting Consultation: Lyn Henri, MD  History of Present Illness: Spencer [name] is admitted to the hospital with known MSSA infection found on blood cultures at beginning of May. He had concern for possible fracture above a prior fusion in cervical spine and was recommended to have collar which he reportedly declines to wear. He left AMA at that time. He was readmitted  for DKA and was restarted on antibiotics. He was at neurologic baseline but given prior injury concern, a repeat MRI of the cervical spine was obtained.   He states that he is having some posterior neck pain. He denies any pain in arms or new weakness/numbness He is swallowing fine and states he is eating regular food.   He was transferred to ICU for flash pulmonary edema.   Past Medical History:  Past Medical History:  Diagnosis Date  . Brain aneurysm   . Broken bones    clavical, ankle, arms toes wriast   . Diabetes mellitus without complication ([place])   . Dysrhythmia   . GERD (gastroesophageal reflux disease)   . Hypertension   . Substance abuse ([place])     Social History: Social History   Socioeconomic History  . Marital status: Single    Spouse name: Not on file  . Number of children: Not on file  . Years of education: Not on file  . Highest education level: Not on file  Occupational History  . Not on file  Tobacco Use  . Smoking status: Current Some Day Smoker  . Smokeless tobacco: Never Used  Substance and Sexual Activity  . Alcohol use: Yes    Alcohol/week: 1.0 standard drinks    Types: 1 Cans of beer per week  . Drug use: Not Currently    Types: Cocaine  . Sexual activity: Not on file  Other Topics Concern  . Not on file  Social History Narrative   ** Merged History Encounter **       Social Determinants  of Health   Financial Resource Strain:   . Difficulty of Paying Living Expenses:   Food Insecurity:   . Worried About Charity fundraiser in the Last Year:   . Arboriculturist in the Last Year:   Transportation Needs:   . Film/video editor (Medical):   Marland Kitchen Lack of Transportation (Non-Medical):   Physical Activity:   . Days of Exercise per Week:   . Minutes of Exercise per Session:   Stress:   . Feeling of Stress :   Social Connections:   . Frequency of Communication with Friends and Family:   . Frequency of Social Gatherings with Friends and Family:   . Attends Religious Services:   . Active Member of Clubs or Organizations:   . Attends Archivist Meetings:   Marland Kitchen Marital Status:   Intimate Partner Violence:   . Fear of Current or Ex-Partner:   . Emotionally Abused:   Marland Kitchen Physically Abused:   . Sexually Abused:    Family History: Family History  Problem Relation Age of Onset  . CAD Sister   . Hypertension Sister   . Healthy Mother   . Healthy Father     Review of Systems:  Review of Systems - General ROS: Negative Psychological ROS: Negative Ophthalmic ROS: Negative ENT ROS: Negative Hematological and Lymphatic ROS: Negative  Endocrine ROS: Negative Respiratory ROS: Negative Cardiovascular ROS: Negative Gastrointestinal ROS: Negative Genito-Urinary ROS: Negative Musculoskeletal ROS: Positive for neck pain Neurological ROS: Negative for numbness or weakness Dermatological ROS: Negative  Physical Exam: BP 136/76   Pulse 86   Temp 97.7 F (36.5 C) (Axillary)   Resp 19   Ht 5\' 10"  (1.778 m)   Wt 70.3 kg   SpO2 96%   BMI 22.24 kg/m  Body mass index is 22.24 kg/m. Body surface area is 1.86 meters squared. General appearance: Alert, cooperative, in no acute distress Head: Normocephalic, atraumatic Eyes: Normal, EOM intact Oropharynx: Moist without lesions Neck: Supple, ROM appears full Ext: No edema in LE bilaterally, good distal  pulses  Neurologic exam:  Mental status: alertness: alert, affect: normal Speech: fluent and clear Motor:strength symmetric 5/5 in bilateral upper and lower extremities Sensory: intact to light touch in all extremities Reflexes: 2+ and symmetric bilaterally for patella Gait: not tested   Imaging: MRI Cervical Spine: 1. Progressive prevertebral soft tissue swelling extending from the base of the clivus to C6 concerning for infection. Abnormal marrow signal concerning for osteomyelitis of the C1, C2, C4, C5, T2, and T3 vertebral bodies. 2. Ventral epidural phlegmon extending from C2-C4 to C5. Dorsal epidural phlegmon extending from C3-C4 through C6-C7. No discrete epidural abscess. No high grade spinal canal stenosis or cord signal abnormality. 3. Acute to subacute mild T3 superior endplate compression fracture. Small C4 vertebral body fracture is better evaluated on prior CT. 4. Prior C5-C7 ACDF. Residual moderate to severe bilateral neuroforaminal stenosis at these levels.    Impression/Plan:  Spencer [name] is seen with known infection and on antibiotics. His MRI does show infection pre-vertebrally but no neurologic symptoms. Could consider broadening antibiotics to help treat and we can consider open biopsy if needed in future to guide therapy but will carry risk of healing. NO other indication seen given hist normal neuro exam and no sign of instability   1.  Diagnosis: Prevertebral abscess  2.  Plan - Available if open biopsy requested and all antibiotic therapy exhausted

## 2020-04-07 NOTE — Progress Notes (Signed)
Hypoglycemic Event  CBG: 62  Treatment: 4 oz juice/soda  Symptoms: None  Follow-up CBG: Time:12225 CBG Result:125  Possible Reasons for Event: Unknown  Comments/MD notified:Dr. Griffit at bedside    Rigoberto Noel

## 2020-04-07 NOTE — Progress Notes (Signed)
CBG 339 after 10 units of novolog, Dr. Denton Lank  Notified, no verbal orders received. Will pass on to next shift.

## 2020-04-08 LAB — GLUCOSE, CAPILLARY
Glucose-Capillary: 517 mg/dL (ref 70–99)
Glucose-Capillary: 79 mg/dL (ref 70–99)
Glucose-Capillary: 65 mg/dL — ABNORMAL LOW (ref 70–99)
Glucose-Capillary: 174 mg/dL — ABNORMAL HIGH (ref 70–99)
Glucose-Capillary: 254 mg/dL — ABNORMAL HIGH (ref 70–99)
Glucose-Capillary: 259 mg/dL — ABNORMAL HIGH (ref 70–99)

## 2020-04-08 MED ORDER — INSULIN ASPART 100 UNIT/ML ~~LOC~~ SOLN
0.0000 [IU] | Freq: Three times a day (TID) | SUBCUTANEOUS | Status: DC
  Administered 2020-04-08: 2 [IU] via SUBCUTANEOUS
  Administered 2020-04-08: 5 [IU] via SUBCUTANEOUS
  Administered 2020-04-09: 7 [IU] via SUBCUTANEOUS
  Administered 2020-04-09 – 2020-04-10 (×3): 2 [IU] via SUBCUTANEOUS
  Administered 2020-04-11: 3 [IU] via SUBCUTANEOUS
  Administered 2020-04-11: 2 [IU] via SUBCUTANEOUS
  Administered 2020-04-12: 7 [IU] via SUBCUTANEOUS
  Administered 2020-04-12 – 2020-04-13 (×2): 2 [IU] via SUBCUTANEOUS
  Administered 2020-04-13: 7 [IU] via SUBCUTANEOUS
  Administered 2020-04-13: 8 [IU] via SUBCUTANEOUS
  Administered 2020-04-14: 3 [IU] via SUBCUTANEOUS
  Administered 2020-04-14: 1 [IU] via SUBCUTANEOUS
  Administered 2020-04-15 (×2): 3 [IU] via SUBCUTANEOUS
  Administered 2020-04-15: 5 [IU] via SUBCUTANEOUS
  Administered 2020-04-16: 2 [IU] via SUBCUTANEOUS
  Administered 2020-04-16: 3 [IU] via SUBCUTANEOUS
  Administered 2020-04-17 – 2020-04-18 (×3): 1 [IU] via SUBCUTANEOUS
  Administered 2020-04-19 (×2): 2 [IU] via SUBCUTANEOUS
  Administered 2020-04-19: 5 [IU] via SUBCUTANEOUS
  Administered 2020-04-20: 0 [IU] via SUBCUTANEOUS
  Administered 2020-04-20: 3 [IU] via SUBCUTANEOUS
  Administered 2020-04-20 – 2020-04-21 (×2): 2 [IU] via SUBCUTANEOUS
  Administered 2020-04-21 (×2): 5 [IU] via SUBCUTANEOUS
  Administered 2020-04-22: 2 [IU] via SUBCUTANEOUS
  Administered 2020-04-23: 1 [IU] via SUBCUTANEOUS
  Administered 2020-04-24: 9 [IU] via SUBCUTANEOUS
  Administered 2020-04-24: 1 [IU] via SUBCUTANEOUS
  Administered 2020-04-24: 7 [IU] via SUBCUTANEOUS
  Administered 2020-04-25: 1 [IU] via SUBCUTANEOUS
  Administered 2020-04-25: 9 [IU] via SUBCUTANEOUS
  Administered 2020-04-25 – 2020-04-26 (×2): 5 [IU] via SUBCUTANEOUS
  Administered 2020-04-26: 9 [IU] via SUBCUTANEOUS
  Administered 2020-04-26: 2 [IU] via SUBCUTANEOUS
  Administered 2020-04-27: 5 [IU] via SUBCUTANEOUS
  Administered 2020-04-27: 2 [IU] via SUBCUTANEOUS
  Filled 2020-04-08 (×44): qty 1

## 2020-04-08 NOTE — Progress Notes (Signed)
Inpatient Diabetes Program Recommendations  AACE/ADA: New Consensus Statement on Inpatient Glycemic Control (2015)  Target Ranges:  Prepandial:   less than 140 mg/dL      Peak postprandial:   less than 180 mg/dL (1-2 hours)      Critically ill patients:  140 - 180 mg/dL   Lab Results  Component Value Date   GLUCAP 65 (L) 04/08/2020   HGBA1C 12.6 (H) 02/15/2020    Review of Glycemic Control Results for Spencer [name], Spencer [name] (MRN [id number]) as of 04/08/2020 08:46  Ref. Range 04/07/2020 12:28 04/07/2020 16:50 04/07/2020 17:57 04/07/2020 19:07 04/07/2020 20:36 04/07/2020 23:03 04/08/2020 03:04 04/08/2020 08:12  Glucose-Capillary Latest Ref Range: 70 - 99 mg/dL 387 (H) 065 (HH) 826 (H) 339 (H) 123 (H) 127 (H) 79 65 (L)   Inpatient Diabetes Program Recommendations:  Noted CBG elevated to 539 @ 16:50 after lunch without meal coverage and hypo post correction. Noted Novolog correction decreased to sensitive this am.   Thank you, Billy Fischer. Yann Biehn, RN, MSN, CDE  Diabetes Coordinator Inpatient Glycemic Control Team Team Pager [phone] (8am-5pm) 04/08/2020 8:50 AM

## 2020-04-08 NOTE — Progress Notes (Signed)
Spencer [name]   KKX:[id number]  DOB: [birth year]  PCP: McLean-Scocuzza, Pasty Spillers, MD    DOA: 03/13/2020 LOS: 19   Brief Narrative   59 year old male with history of uncontrolled type 2 diabetes mellitus, cocaine use, brain aneurysm, hypertension, GERD recently hospitalized on 4/28 for DKA, MSSA bacteremia and AKI but signed out AMA on 5/5 was brought by EMS with hyperglycemia and encephalopathy.  In the ED was found to be in DKA with high anion gap of 30.  Given aggressive IV hydration and insulin drip with resolution of DKA.  Hospital course prolonged due to presence of recurrent MSSA bacteremia.    DKA resolved.. The patient will need to complete 6 weeks of IV nafcillin prior to discharge per ID.  Last day of therapy would be 04/26/2020.   On the evening of 04/03/2020 the patient developed acute flash pulmonary edema. He was transferred to the ICU and given diuresis and placed on BIPAP.  He had similar presentation on 5/27 requiring transfer to ICU.    Assessment & Plan   Principal Problem:   MSSA bacteremia Active Problems:   DKA (diabetic ketoacidoses) (HCC)   Hypertension   Diabetic neuropathy (HCC)   DM2 (diabetes mellitus, type 2) (HCC)   History of cocaine abuse (HCC)   Personal history of subdural hematoma   AKI (acute kidney injury) (HCC)   GERD (gastroesophageal reflux disease)   C4 cervical fracture (HCC)   Cigarette smoker   Pressure injury of skin   MSSA bacteremia, persistent Patient left AMA during last hospital stay.  Blood culture on admission (5/8) positive for MSSA. 2D echo unremarkable.  Refused TEE, although was cleared by neurosurgery for rate regarding his C-spine fracture.  Repeat blood culture on 5/10, negative. --IV nafcillin for total 6 weeks (until 04/26/2020).   --Needs to be treated for full course of antibiotic in the hospital.   Acute pulmonary edema - resolved Unclear etiology.  2D echo from 5/8 with normal EF and no valvular  abnormality.  Patient refused TEE.  Repeated 2D echo on 6/1 which did not show any new valvular issues, EF 55-60%.   --Monitor strict I's/O.   --As needed IV Lasix.    Cervical and thoracic spine osteomyelitis Seen on MRI of the cervical spine from 5/27 (done with concern for myositis on MRI of the left shoulder).  Showed progressive perivertebral soft tissue swelling with concern for osteomyelitis of C1-C5, T2 and T3 vertebral bodies.  Also has ventral epidural phlegmon extending from C2-C5 and dorsal epidural phlegmon extending from C3-C4-6 6-C7, without discrete epidural abscess, high-grade stenosis or cord signal abnormality.  Also has an acute to subacute mild T3 superior endplate compression fracture. --ID recommends no escalation of antibiotic.   --Neurosurgery consulted, following - will consider open biopsy if persistent despite antibiotics  Diabetes mellitus type 2, poorly controlled with hyper and hypoglycemia A1c of 12.6.  Presented with recurrent DKA which has resolved.  Currently on Lantus with Premeal aspart and sliding scale coverage.  Needs close CBG monitoring.  Minimally displaced C4 fracture Recommendation to wear Aspen collar at all times but patient refusing.   Outpatient follow-up with neurosurgery Patsey Berthold, NP).  Essential hypertension Stable.  Continue amlodipine and losartan.  Ongoing cocaine use Urine drug screen positive this admission.  He reportedly was getting agitated past 2 days and per sister he was supposed to receive paycheck with which he could buy some drugs.  On as needed clonazepam and Haldol.  Acute kidney injury Prerenal secondary to DKA.  Resolved.  Issues with decision-making capacity Seen by psychiatry multiple times this admission and has capacity.  Cannot be involuntarily committed if he decides to leave AMA.   Patient BMI: Body mass index is 22.24 kg/m.   DVT prophylaxis: Lovenox  Diet:  Diet Orders (From admission,  onward)    Start     Ordered   03/15/20 1440  Diet Carb Modified Fluid consistency: Thin; Room service appropriate? Yes with Assist  Diet effective now    Question Answer Comment  Diet-HS Snack? Nothing   Calorie Level Medium 1600-2000   Fluid consistency: Thin   Room service appropriate? Yes with Assist      03/15/20 1439            Code Status: Full Code    Subjective 04/08/20    Patient seen at bedside this AM.  No acute complaints aside from ongoing neck pain.  No fevers or chills or acute complaints.   Disposition Plan & Communication   Status is: Inpatient  Remains inpatient appropriate because:IV treatments appropriate due to intensity of illness or inability to take PO   Dispo: The patient is from: Home              Anticipated d/c is to: Home              Anticipated d/c date is: > 3 days              Patient currently is not medically stable to d/c.   Family Communication: none at bedside, will attempt to call    Consults, Procedures, Significant Events   Consultants:   Neurosurgery  Infectious Disease  Cardiology  Procedures:   MRI Cervical Spine  2D Echo  Antimicrobials:   IV Nafcillin  5/21 >>    Objective   Vitals:   04/07/20 0853 04/07/20 1328 04/07/20 2024 04/08/20 0438  BP: 136/76 (!) 141/87 (!) 143/89 132/83  Pulse: 86 91 96 89  Resp:  17 20 18   Temp:  98.2 F (36.8 C) 98.8 F (37.1 C) 98.9 F (37.2 C)  TempSrc:  Oral Oral Oral  SpO2: 96% 98% 98% 97%  Weight:      Height:        Intake/Output Summary (Last 24 hours) at 04/08/2020 0724 Last data filed at 04/08/2020 0538 Gross per 24 hour  Intake 166.54 ml  Output 2700 ml  Net -2533.46 ml   Filed Weights   03/13/20 1654 03/13/20 2055 04/01/20 2230  Weight: 59 kg 60.3 kg 70.3 kg    Physical Exam:  General exam: awake, alert, no acute distress, disheveled appearing Respiratory system: CTAB, no wheezes, rales or rhonchi, normal respiratory effort. Cardiovascular  system: normal S1/S2, RRR, no pedal edema.   Extremities: moves all, no cyanosis, normal tone   Labs   Data Reviewed: I have personally reviewed following labs and imaging studies  CBC: Recent Labs  Lab 04/01/20 2238 04/05/20 0348  WBC 10.4 8.0  HGB 10.1* 9.4*  HCT 31.7* 29.0*  MCV 93.0 91.5  PLT 506* 096   Basic Metabolic Panel: Recent Labs  Lab 04/01/20 2238 04/02/20 0523 04/03/20 0506 04/05/20 0348 04/07/20 0408  NA 138  --  141 140 142  K 4.2  --  4.0 3.9 3.9  CL 109  --  110 109 109  CO2 23  --  25 24 25   GLUCOSE 341*  --  109* 169* 287*  BUN 29*  --  31* 28* 32*  CREATININE 0.85 0.66 0.62 0.55* 0.87  CALCIUM 8.3*  --  8.2* 8.1* 8.3*   GFR: Estimated Creatinine Clearance: 88.7 mL/min (by C-G formula based on SCr of 0.87 mg/dL). Liver Function Tests: Recent Labs  Lab 04/01/20 2238 04/05/20 0348  AST 13* 13*  ALT 13 11  ALKPHOS 86 77  BILITOT 0.7 0.7  PROT 6.5 5.7*  ALBUMIN 2.2* 2.0*   No results for input(s): LIPASE, AMYLASE in the last 168 hours. No results for input(s): AMMONIA in the last 168 hours. Coagulation Profile: No results for input(s): INR, PROTIME in the last 168 hours. Cardiac Enzymes: No results for input(s): CKTOTAL, CKMB, CKMBINDEX, TROPONINI in the last 168 hours. BNP (last 3 results) No results for input(s): PROBNP in the last 8760 hours. HbA1C: No results for input(s): HGBA1C in the last 72 hours. CBG: Recent Labs  Lab 04/07/20 1757 04/07/20 1907 04/07/20 2036 04/07/20 2303 04/08/20 0304  GLUCAP 495* 339* 123* 127* 79   Lipid Profile: No results for input(s): CHOL, HDL, LDLCALC, TRIG, CHOLHDL, LDLDIRECT in the last 72 hours. Thyroid Function Tests: No results for input(s): TSH, T4TOTAL, FREET4, T3FREE, THYROIDAB in the last 72 hours. Anemia Panel: No results for input(s): VITAMINB12, FOLATE, FERRITIN, TIBC, IRON, RETICCTPCT in the last 72 hours. Sepsis Labs: No results for input(s): PROCALCITON, LATICACIDVEN in the  last 168 hours.  Recent Results (from the past 240 hour(s))  Culture, blood (Routine X 2) w Reflex to ID Panel     Status: None   Collection Time: 04/01/20 10:38 PM   Specimen: BLOOD LEFT HAND  Result Value Ref Range Status   Specimen Description BLOOD LEFT HAND  Final   Special Requests   Final    BOTTLES DRAWN AEROBIC AND ANAEROBIC Blood Culture adequate volume   Culture   Final    NO GROWTH 5 DAYS Performed at [facility], [address] [place], [place] [address]    Report Status 04/06/2020 FINAL  Final  Culture, blood (Routine X 2) w Reflex to ID Panel     Status: None   Collection Time: 04/01/20 10:38 PM   Specimen: Left Antecubital; Blood  Result Value Ref Range Status   Specimen Description LEFT ANTECUBITAL  Final   Special Requests   Final    BOTTLES DRAWN AEROBIC AND ANAEROBIC Blood Culture adequate volume   Culture   Final    NO GROWTH 5 DAYS Performed at [facility], [address] [place], [place] [address]    Report Status 04/06/2020 FINAL  Final  MRSA PCR Screening     Status: None   Collection Time: 04/01/20 11:20 PM   Specimen: Nasopharyngeal  Result Value Ref Range Status   MRSA by PCR NEGATIVE NEGATIVE Final    Comment:        The GeneXpert MRSA Assay (FDA approved for NASAL specimens only), is one component of a comprehensive MRSA colonization surveillance program. It is not intended to diagnose MRSA infection nor to guide or monitor treatment for MRSA infections. Performed at [facility], [address] [place], [place] [address]       Imaging Studies   ECHOCARDIOGRAM COMPLETE  Result Date: 04/06/2020    ECHOCARDIOGRAM REPORT   Patient Name:   OTTIE TILLERY Date of Exam: 04/06/2020 Medical Rec #:  [id number]    Height:       70.0 in Accession #:    [id number]   Weight:  155.0 lb Date of Birth:  Jan 03, 1961     BSA:          1.873 m Patient Age:    59 years     BP:           193/124 mmHg Patient Gender: M             HR:           105 bpm. Exam Location:  [facility] Procedure: 2D Echo, Cardiac Doppler and Color Doppler Indications:     Acute respiratory insufficiency 518.82  History:         Patient has prior history of Echocardiogram examinations, most                  recent 03/14/2020. Risk Factors:Hypertension and Diabetes.                  Substance abuse.  Sonographer:     Cristela Blue RDCS (AE) Referring Phys:  [id number] Diagnosing Phys: Yvonne Kendall MD IMPRESSIONS  1. Left ventricular ejection fraction, by estimation, is 50 to 55%. The left ventricle has low normal function. The left ventricle has no regional wall motion abnormalities. There is mild left ventricular hypertrophy. Left ventricular diastolic parameters are consistent with Grade I diastolic dysfunction (impaired relaxation).  2. Right ventricular systolic function is normal. The right ventricular size is normal. Tricuspid regurgitation signal is inadequate for assessing PA pressure.  3. The mitral valve is normal in structure. Trivial mitral valve regurgitation.  4. The aortic valve was not well visualized. Aortic valve regurgitation is not visualized. No aortic stenosis is present.  5. Pulmonic valve regurgitation not well assessed.  6. The inferior vena cava is normal in size with greater than 50% respiratory variability, suggesting right atrial pressure of 3 mmHg. FINDINGS  Left Ventricle: Left ventricular ejection fraction, by estimation, is 50 to 55%. The left ventricle has low normal function. The left ventricle has no regional wall motion abnormalities. The left ventricular internal cavity size was normal in size. There is mild left ventricular hypertrophy. Left ventricular diastolic parameters are consistent with Grade I diastolic dysfunction (impaired relaxation). Right Ventricle: The right ventricular size is normal. No increase in right ventricular wall thickness. Right ventricular systolic function is normal. Tricuspid regurgitation signal  is inadequate for assessing PA pressure. Left Atrium: Left atrial size was normal in size. Right Atrium: Right atrial size was normal in size. Pericardium: There is no evidence of pericardial effusion. Mitral Valve: The mitral valve is normal in structure. Trivial mitral valve regurgitation. Tricuspid Valve: The tricuspid valve is not well visualized. Tricuspid valve regurgitation is not demonstrated. Aortic Valve: The aortic valve was not well visualized. Aortic valve regurgitation is not visualized. No aortic stenosis is present. Aortic valve mean gradient measures 4.0 mmHg. Aortic valve peak gradient measures 7.2 mmHg. Aortic valve area, by VTI measures 3.30 cm. Pulmonic Valve: The pulmonic valve was not well visualized. Pulmonic valve regurgitation not well assessed. Aorta: The aortic root is normal in size and structure. Pulmonary Artery: The pulmonary artery is not well seen. Venous: The inferior vena cava is normal in size with greater than 50% respiratory variability, suggesting right atrial pressure of 3 mmHg. IAS/Shunts: The interatrial septum was not well visualized. Additional Comments: There is pleural effusion in the left lateral region.  LEFT VENTRICLE PLAX 2D LVIDd:         4.42 cm      Diastology LVIDs:  3.47 cm      LV e' lateral:   7.62 cm/s LV PW:         1.36 cm      LV E/e' lateral: 9.8 LV IVS:        0.94 cm      LV e' medial:    7.29 cm/s LVOT diam:     2.30 cm      LV E/e' medial:  10.3 LV SV:         77 LV SV Index:   41 LVOT Area:     4.15 cm  LV Volumes (MOD) LV vol d, MOD A2C: 85.9 ml LV vol d, MOD A4C: 112.0 ml LV vol s, MOD A2C: 33.8 ml LV vol s, MOD A4C: 48.4 ml LV SV MOD A2C:     52.1 ml LV SV MOD A4C:     112.0 ml LV SV MOD BP:      56.9 ml RIGHT VENTRICLE RV Basal diam:  3.30 cm RV S prime:     18.80 cm/s TAPSE (M-mode): 3.8 cm LEFT ATRIUM             Index       RIGHT ATRIUM           Index LA diam:        3.70 cm 1.98 cm/m  RA Area:     18.40 cm LA Vol (A2C):   52.9 ml  28.24 ml/m RA Volume:   53.40 ml  28.51 ml/m LA Vol (A4C):   45.8 ml 24.45 ml/m LA Biplane Vol: 50.6 ml 27.02 ml/m  AORTIC VALVE                   PULMONIC VALVE AV Area (Vmax):    3.29 cm    PV Vmax:        1.11 m/s AV Area (Vmean):   3.74 cm    PV Peak grad:   4.9 mmHg AV Area (VTI):     3.30 cm    RVOT Peak grad: 8 mmHg AV Vmax:           134.00 cm/s AV Vmean:          91.950 cm/s AV VTI:            0.234 m AV Peak Grad:      7.2 mmHg AV Mean Grad:      4.0 mmHg LVOT Vmax:         106.00 cm/s LVOT Vmean:        82.800 cm/s LVOT VTI:          0.186 m LVOT/AV VTI ratio: 0.79  AORTA Ao Root diam: 3.50 cm MITRAL VALVE MV Area (PHT): 4.46 cm    SHUNTS MV Decel Time: 170 msec    Systemic VTI:  0.19 m MV E velocity: 75.00 cm/s  Systemic Diam: 2.30 cm MV A velocity: 96.80 cm/s MV E/A ratio:  0.77 Cristal Deer End MD Electronically signed by Yvonne Kendall MD Signature Date/Time: 04/06/2020/5:51:45 PM    Final      Medications   Scheduled Meds: . amLODipine  10 mg Oral Daily  . aspirin EC  81 mg Oral Daily  . chlorhexidine  15 mL Mouth Rinse BID  . Chlorhexidine Gluconate Cloth  6 each Topical Daily  . clonazePAM  0.5 mg Oral BID  . DULoxetine  30 mg Oral Daily  . enoxaparin (LOVENOX) injection  40 mg Subcutaneous Q24H  . famotidine  20 mg Oral Daily  . feeding supplement (GLUCERNA SHAKE)  237 mL Oral BID BM  . furosemide  40 mg Oral BID  . haloperidol  0.25 mg Oral BID  . insulin aspart  0-5 Units Subcutaneous QHS  . insulin aspart  0-9 Units Subcutaneous TID WC  . insulin aspart  12 Units Subcutaneous TID WC  . insulin glargine  32 Units Subcutaneous Daily  . losartan  50 mg Oral Daily  . mouth rinse  15 mL Mouth Rinse q12n4p  . methocarbamol  500 mg Oral TID  . multivitamin with minerals  1 tablet Oral Daily  . OLANZapine  5 mg Oral QHS  . polyethylene glycol  17 g Oral Daily  . predniSONE  60 mg Oral Q breakfast   Continuous Infusions: . sodium chloride 20.8 mL/hr at 04/01/20 0938    . nafcillin (NAFCIL) continuous infusion 12 g (04/07/20 1415)       LOS: 26 days    Time spent: 15 minutes    Pennie BanterKelly A Nikkolas Coomes, DO [facility]  04/08/2020, 7:24 AM    If 7PM-7AM, please contact night-coverage. How to contact the [facility] Attending or Consulting provider 7A - 7P or covering provider during after hours 7P -7A, for this patient?    1. Check the care team in [facility] and look for a) attending/consulting [facility] provider listed and b) the [facility] team listed 2. Log into [email or web address] and use [place]'s universal password to access. If you do not have the password, please contact the hospital operator. 3. Locate the [facility] provider you are looking for under [facility] and page to a number that you can be directly reached. 4. If you still have difficulty reaching the provider, please page the [facility] (Director on Call) for the Hospitalists listed on amion for assistance.

## 2020-04-09 LAB — GLUCOSE, CAPILLARY
Glucose-Capillary: 182 mg/dL — ABNORMAL HIGH (ref 70–99)
Glucose-Capillary: 79 mg/dL (ref 70–99)
Glucose-Capillary: 313 mg/dL — ABNORMAL HIGH (ref 70–99)
Glucose-Capillary: 285 mg/dL — ABNORMAL HIGH (ref 70–99)

## 2020-04-09 MED ORDER — HYDROCODONE-ACETAMINOPHEN 5-325 MG PO TABS
1.0000 | ORAL_TABLET | Freq: Four times a day (QID) | ORAL | Status: DC | PRN
  Administered 2020-04-09 – 2020-04-26 (×26): 1 via ORAL
  Filled 2020-04-09 (×26): qty 1

## 2020-04-09 NOTE — Progress Notes (Signed)
PROGRESS NOTE    Spencer [name]   ELF:[id number]  DOB: 25-Oct-1961  PCP: McLean-Scocuzza, Nino Glow, MD    DOA: 03/13/2020 LOS: 66   Brief Narrative   59 year old male with history of uncontrolled type 2 diabetes mellitus, cocaine use, brain aneurysm, hypertension, GERD recently hospitalized on 4/28 for DKA, MSSA bacteremia and AKI but signed out AMA on 5/5 was brought by EMS with hyperglycemia and encephalopathy.  In the ED was found to be in DKA with high anion gap of 30.  Given aggressive IV hydration and insulin drip with resolution of DKA.  Hospital course prolonged due to presence of recurrent MSSA bacteremia.    DKA resolved.. The patient will need to complete 6 weeks of IV nafcillin prior to discharge per ID.  Last day of therapy would be 04/26/2020.   On the evening of 04/03/2020 the patient developed acute flash pulmonary edema. He was transferred to the ICU and given diuresis and placed on BIPAP.  He had similar presentation on 5/27 requiring transfer to ICU.    Assessment & Plan   Principal Problem:   MSSA bacteremia Active Problems:   DKA (diabetic ketoacidoses) ([place])   Hypertension   Diabetic neuropathy (HCC)   DM2 (diabetes mellitus, type 2) ([place])   History of cocaine abuse ([place])   Personal history of subdural hematoma   AKI (acute kidney injury) ([place])   GERD (gastroesophageal reflux disease)   C4 cervical fracture (HCC)   Cigarette smoker   Pressure injury of skin   MSSA bacteremia, persistent Patient left AMA during last hospital stay.  Blood culture on admission (5/8) positive for MSSA. 2D echo unremarkable.  Refused TEE, although was cleared by neurosurgery for rate regarding his C-spine fracture.  Repeat blood culture on 5/10, negative. --IV nafcillin for total 6 weeks (until 04/26/2020).   --Needs to be treated for full course of antibiotic in the hospital.   Acute pulmonary edema - resolved Unclear etiology.  2D echo from 5/8 with normal EF and no valvular  abnormality.  Patient refused TEE.  Repeated 2D echo on 6/1 which did not show any new valvular issues, EF 55-60%.   --Monitor strict I's/O.   --As needed IV Lasix.    Cervical and thoracic spine osteomyelitis Seen on MRI of the cervical spine from 5/27 (done with concern for myositis on MRI of the left shoulder).  Showed progressive perivertebral soft tissue swelling with concern for osteomyelitis of C1-C5, T2 and T3 vertebral bodies.  Also has ventral epidural phlegmon extending from C2-C5 and dorsal epidural phlegmon extending from C3-C4-6 6-C7, without discrete epidural abscess, high-grade stenosis or cord signal abnormality.  Also has an acute to subacute mild T3 superior endplate compression fracture. --ID recommends no escalation of antibiotic.   --Neurosurgery consulted, following - will consider open biopsy if persistent despite antibiotics  Diabetes mellitus type 2, poorly controlled with hyper and hypoglycemia A1c of 12.6.  Presented with recurrent DKA which has resolved.  Currently on Lantus with Premeal aspart and sliding scale coverage.  Needs close CBG monitoring.  Minimally displaced C4 fracture Recommendation to wear Aspen collar at all times but patient refusing.   Outpatient follow-up with neurosurgery Lonell Face, NP).  Essential hypertension Stable.  Continue amlodipine and losartan.  Ongoing cocaine use Urine drug screen positive this admission.  He reportedly was getting agitated past 2 days and per sister he was supposed to receive paycheck with which he could buy some drugs.  On as needed clonazepam and Haldol.  Acute kidney injury Prerenal secondary to DKA.  Resolved.  Issues with decision-making capacity Seen by psychiatry multiple times this admission and has capacity.  Cannot be involuntarily committed if he decides to leave AMA.   Patient BMI: Body mass index is 22.24 kg/m.   DVT prophylaxis: Lovenox  Diet:  Diet Orders (From admission,  onward)    Start     Ordered   03/15/20 1440  Diet Carb Modified Fluid consistency: Thin; Room service appropriate? Yes with Assist  Diet effective now    Question Answer Comment  Diet-HS Snack? Nothing   Calorie Level Medium 1600-2000   Fluid consistency: Thin   Room service appropriate? Yes with Assist      03/15/20 1439            Code Status: Full Code    Subjective 04/09/20    Patient seen at bedside this AM. This morning he was having "12/10" pain in his neck despite tramadol.  He states it is mildly improved with Norco.  No fevers or chills or other acute complaints.   Disposition Plan & Communication   Status is: Inpatient  Remains inpatient appropriate because:IV treatments appropriate due to intensity of illness or inability to take PO   Dispo: The patient is from: Home              Anticipated d/c is to: Home              Anticipated d/c date is: > 3 days              Patient currently is not medically stable to d/c.   Family Communication: none at bedside, will attempt to call    Consults, Procedures, Significant Events   Consultants:   Neurosurgery  Infectious Disease  Cardiology  Procedures:   MRI Cervical Spine  2D Echo  Antimicrobials:   IV Nafcillin  5/21 >>    Objective   Vitals:   04/08/20 0438 04/08/20 1349 04/08/20 2016 04/09/20 0457  BP: 132/83 (!) 149/89 (!) 152/91 (!) 148/93  Pulse: 89 95 97 89  Resp: 18 16 16 20   Temp: 98.9 F (37.2 C) 97.8 F (36.6 C) 98.5 F (36.9 C) (!) 97.5 F (36.4 C)  TempSrc: Oral Oral Oral Oral  SpO2: 97% 98% 97% 97%  Weight:      Height:        Intake/Output Summary (Last 24 hours) at 04/09/2020 0733 Last data filed at 04/09/2020 0506 Gross per 24 hour  Intake 745.35 ml  Output 900 ml  Net -154.65 ml   Filed Weights   03/13/20 1654 03/13/20 2055 04/01/20 2230  Weight: 59 kg 60.3 kg 70.3 kg    Physical Exam:  General exam: awake, alert, no acute distress, disheveled  appearing Respiratory system: CTAB, no wheezes, rales or rhonchi, normal respiratory effort. Cardiovascular system: normal S1/S2, RRR, no pedal edema.   Extremities: moves all, no cyanosis, normal tone   Labs   Data Reviewed: I have personally reviewed following labs and imaging studies  CBC: Recent Labs  Lab 04/05/20 0348  WBC 8.0  HGB 9.4*  HCT 29.0*  MCV 91.5  PLT 364   Basic Metabolic Panel: Recent Labs  Lab 04/03/20 0506 04/05/20 0348 04/07/20 0408  NA 141 140 142  K 4.0 3.9 3.9  CL 110 109 109  CO2 25 24 25   GLUCOSE 109* 169* 287*  BUN 31* 28* 32*  CREATININE 0.62 0.55* 0.87  CALCIUM 8.2* 8.1* 8.3*  GFR: Estimated Creatinine Clearance: 88.7 mL/min (by C-G formula based on SCr of 0.87 mg/dL). Liver Function Tests: Recent Labs  Lab 04/05/20 0348  AST 13*  ALT 11  ALKPHOS 77  BILITOT 0.7  PROT 5.7*  ALBUMIN 2.0*   No results for input(s): LIPASE, AMYLASE in the last 168 hours. No results for input(s): AMMONIA in the last 168 hours. Coagulation Profile: No results for input(s): INR, PROTIME in the last 168 hours. Cardiac Enzymes: No results for input(s): CKTOTAL, CKMB, CKMBINDEX, TROPONINI in the last 168 hours. BNP (last 3 results) No results for input(s): PROBNP in the last 8760 hours. HbA1C: No results for input(s): HGBA1C in the last 72 hours. CBG: Recent Labs  Lab 04/08/20 0304 04/08/20 0812 04/08/20 1226 04/08/20 1650 04/08/20 2053  GLUCAP 79 65* 174* 254* 259*   Lipid Profile: No results for input(s): CHOL, HDL, LDLCALC, TRIG, CHOLHDL, LDLDIRECT in the last 72 hours. Thyroid Function Tests: No results for input(s): TSH, T4TOTAL, FREET4, T3FREE, THYROIDAB in the last 72 hours. Anemia Panel: No results for input(s): VITAMINB12, FOLATE, FERRITIN, TIBC, IRON, RETICCTPCT in the last 72 hours. Sepsis Labs: No results for input(s): PROCALCITON, LATICACIDVEN in the last 168 hours.  Recent Results (from the past 240 hour(s))  Culture,  blood (Routine X 2) w Reflex to ID Panel     Status: None   Collection Time: 04/01/20 10:38 PM   Specimen: BLOOD LEFT HAND  Result Value Ref Range Status   Specimen Description BLOOD LEFT HAND  Final   Special Requests   Final    BOTTLES DRAWN AEROBIC AND ANAEROBIC Blood Culture adequate volume   Culture   Final    NO GROWTH 5 DAYS Performed at [facility], [address] [place], [place] [address]    Report Status 04/06/2020 FINAL  Final  Culture, blood (Routine X 2) w Reflex to ID Panel     Status: None   Collection Time: 04/01/20 10:38 PM   Specimen: Left Antecubital; Blood  Result Value Ref Range Status   Specimen Description LEFT ANTECUBITAL  Final   Special Requests   Final    BOTTLES DRAWN AEROBIC AND ANAEROBIC Blood Culture adequate volume   Culture   Final    NO GROWTH 5 DAYS Performed at [facility], [address] [place], [place] [address]    Report Status 04/06/2020 FINAL  Final  MRSA PCR Screening     Status: None   Collection Time: 04/01/20 11:20 PM   Specimen: Nasopharyngeal  Result Value Ref Range Status   MRSA by PCR NEGATIVE NEGATIVE Final    Comment:        The GeneXpert MRSA Assay (FDA approved for NASAL specimens only), is one component of a comprehensive MRSA colonization surveillance program. It is not intended to diagnose MRSA infection nor to guide or monitor treatment for MRSA infections. Performed at [facility], [address] [place], [place] [address]       Imaging Studies   No results found.   Medications   Scheduled Meds: . amLODipine  10 mg Oral Daily  . aspirin EC  81 mg Oral Daily  . chlorhexidine  15 mL Mouth Rinse BID  . Chlorhexidine Gluconate Cloth  6 each Topical Daily  . clonazePAM  0.5 mg Oral BID  . DULoxetine  30 mg Oral Daily  . enoxaparin (LOVENOX) injection  40 mg Subcutaneous Q24H  . famotidine  20 mg Oral Daily  . feeding supplement (GLUCERNA SHAKE)  237 mL Oral BID BM  .  furosemide  40 mg Oral BID  . haloperidol  0.25 mg Oral BID  . insulin aspart  0-5 Units Subcutaneous QHS  . insulin aspart  0-9 Units Subcutaneous TID WC  . insulin aspart  12 Units Subcutaneous TID WC  . insulin glargine  32 Units Subcutaneous Daily  . losartan  50 mg Oral Daily  . mouth rinse  15 mL Mouth Rinse q12n4p  . methocarbamol  500 mg Oral TID  . multivitamin with minerals  1 tablet Oral Daily  . OLANZapine  5 mg Oral QHS  . polyethylene glycol  17 g Oral Daily  . predniSONE  60 mg Oral Q breakfast   Continuous Infusions: . sodium chloride 20.8 mL/hr at 04/01/20 0938  . nafcillin (NAFCIL) continuous infusion 12 g (04/08/20 1454)       LOS: 27 days    Time spent: 15 minutes    Pennie Banter, DO [facility]  04/09/2020, 7:33 AM    If 7PM-7AM, please contact night-coverage. How to contact the [facility] Attending or Consulting provider 7A - 7P or covering provider during after hours 7P -7A, for this patient?    1. Check the care team in [facility] and look for a) attending/consulting [facility] provider listed and b) the [facility] team listed 2. Log into [email or web address] and use [facility] universal password to access. If you do not have the password, please contact the hospital operator. 3. Locate the [facility] provider you are looking for under [facility] and page to a number that you can be directly reached. 4. If you still have difficulty reaching the provider, please page the [facility] (Director on Call) for the Hospitalists listed on amion for assistance.

## 2020-04-09 NOTE — Progress Notes (Signed)
Nutrition Follow-up  DOCUMENTATION CODES:   Severe malnutrition in context of social or environmental circumstances  INTERVENTION:   Continue Glucerna Shake po BID, each supplement provides 220 kcal and 10 grams of protein. Patient prefers chocolate.  Continue double protein portions at meals.  Continue daily MVI.  NUTRITION DIAGNOSIS:   Severe Malnutrition related to social / environmental circumstances(hx substance abuse, inadequate oral intake) as evidenced by severe fat depletion, severe muscle depletion. Ongoing - addressing with nutrition interventions.  GOAL:   Patient will meet greater than or equal to 90% of their needs Met.  MONITOR:   PO intake, Supplement acceptance, Labs, Weight trends, I & O's, Skin  ASSESSMENT:   59 year old male with PMHx of HTN, substance abuse, DM, GERD, brain aneurysm admitted with DKA, MSSA bacteremia, AKI, metabolic encephalopathy.   Pt continues to have good appetite and oral intake; pt eating 100% of meals and drinking Glucerna supplements. No new weight since 5/27; will request weekly weights.    Medications reviewed and include: aspirin, lovenox, pepcid, lasix, insulin, MVI, miralax, prednisone, nafcillin   Labs reviewed: BUN 32(H) Hgb 9.4(L), Hct 29.0(L) cbgs- 182, 79 x 24 hrs  Diet Order:   Diet Order            Diet Carb Modified Fluid consistency: Thin; Room service appropriate? Yes with Assist  Diet effective now             EDUCATION NEEDS:   Not appropriate for education at this time  Skin:  Skin Assessment: Skin Integrity Issues:(Stage 2 right ischial tuberosity; unstageable sacrum (1cm x 1.8cm); unstageable right elbow (2cm x 1.5cm x 0.1cm))  Last BM:  6/3- type 2  Height:   Ht Readings from Last 1 Encounters:  03/13/20 5' 10"  (1.778 m)   Weight:   Wt Readings from Last 1 Encounters:  04/01/20 70.3 kg   Ideal Body Weight:  75.5 kg  BMI:  Body mass index is 22.24 kg/m.  Estimated Nutritional  Needs:   Kcal:  1800-2100  Protein:  90-100 grams  Fluid:  1.8-2 L/day  Koleen Distance MS, RD, LDN Please refer to [facility] for RD and/or RD on-call/weekend/after hours pager

## 2020-04-09 NOTE — Progress Notes (Signed)
ID  Doing well No pain No fever No sob  Patient Vitals for the past 24 hrs:  BP Temp Temp src Pulse Resp SpO2  04/09/20 1141 (!) 152/93 97.9 F (36.6 C) Oral 98 20 96 %  04/09/20 0457 (!) 148/93 (!) 97.5 F (36.4 C) Oral 89 20 97 %  04/08/20 2016 (!) 152/91 98.5 F (36.9 C) Oral 97 16 97 %     Awake and alert Chest b/l air entry HS s1s2 abd soft Cns non focal  Labs CBC Latest Ref Rng & Units 04/05/2020 04/01/2020 03/31/2020  WBC 4.0 - 10.5 K/uL 8.0 10.4 9.8  Hemoglobin 13.0 - 17.0 g/dL 9.4(L) 10.1(L) 9.1(L)  Hematocrit 39.0 - 52.0 % 29.0(L) 31.7(L) 28.3(L)  Platelets 150 - 400 K/uL 364 506(H) 552(H)     CMP Latest Ref Rng & Units 04/07/2020 04/05/2020 04/03/2020  Glucose 70 - 99 mg/dL 287(H) 169(H) 109(H)  BUN 8 - 23 mg/dL 32(H) 28(H) 31(H)  Creatinine 0.61 - 1.24 mg/dL 0.87 0.55(L) 0.62  Sodium 135 - 145 mmol/L 142 140 141  Potassium 3.5 - 5.1 mmol/L 3.9 3.9 4.0  Chloride 98 - 111 mmol/L 109 109 110  CO2 22 - 32 mmol/L 25 24 25   Calcium 8.9 - 10.3 mg/dL 8.3(L) 8.1(L) 8.2(L)  Total Protein 6.5 - 8.1 g/dL - 5.7(L) -  Total Bilirubin 0.3 - 1.2 mg/dL - 0.7 -  Alkaline Phos 38 - 126 U/L - 77 -  AST 15 - 41 U/L - 13(L) -  ALT 0 - 44 U/L - 11 -   Micro 03/04/2020 blood cultures staph aureus 03/13/2020 staph aureus and blood culture 03/15/2020 blood culture no growth 04/01/2020 blood culture no growth   Impression/recommendation Staph aureus bacteremia with extensive but cervical vertebral involvement with prevertebral phlegmon, ventral epidural phlegmon and with abnormal marrow signal concerning for osteomyelitis of C1-C2 C4-C5 T2 and T3. Patient is on nafcillin and is slowly improving.  His sed rate is decreasing.  Thrombocytosis is resolving. Appreciate neurosurgery input- no surgical intervention. Will follow CRP and ESR Continue nafcillin 04/26/20  Flash pulmonary edema- resolved  Anemia   History of cocaine use  History of leaving AMA twice before  Bipolar  disorder Seen by psychiatrist.  Discussed the management with the patient. ID will follow him peripherally

## 2020-04-10 LAB — GLUCOSE, CAPILLARY
Glucose-Capillary: 165 mg/dL — ABNORMAL HIGH (ref 70–99)
Glucose-Capillary: 111 mg/dL — ABNORMAL HIGH (ref 70–99)
Glucose-Capillary: 191 mg/dL — ABNORMAL HIGH (ref 70–99)
Glucose-Capillary: 96 mg/dL (ref 70–99)

## 2020-04-10 NOTE — Progress Notes (Signed)
PROGRESS NOTE    Spencer [name]   BJY:[id number]  DOB: August 17, 1961  PCP: McLean-Scocuzza, Pasty Spillers, MD    DOA: 03/13/2020 LOS: 3   Brief Narrative   59 year old male with history of uncontrolled type 2 diabetes mellitus, cocaine use, brain aneurysm, hypertension, GERD recently hospitalized on 4/28 for DKA, MSSA bacteremia and AKI but signed out AMA on 5/5 was brought by EMS with hyperglycemia and encephalopathy.  In the ED was found to be in DKA with high anion gap of 30.  Given aggressive IV hydration and insulin drip with resolution of DKA.  Hospital course prolonged due to presence of recurrent MSSA bacteremia.    DKA resolved.. The patient will need to complete 6 weeks of IV nafcillin prior to discharge per ID.  Last day of therapy would be 04/26/2020.   On the evening of 04/03/2020 the patient developed acute flash pulmonary edema. He was transferred to the ICU and given diuresis and placed on BIPAP.  He had similar presentation on 5/27 requiring transfer to ICU.    Assessment & Plan   Principal Problem:   MSSA bacteremia Active Problems:   DKA (diabetic ketoacidoses) (HCC)   Hypertension   Diabetic neuropathy (HCC)   DM2 (diabetes mellitus, type 2) (HCC)   History of cocaine abuse (HCC)   Personal history of subdural hematoma   AKI (acute kidney injury) (HCC)   GERD (gastroesophageal reflux disease)   C4 cervical fracture (HCC)   Cigarette smoker   Pressure injury of skin   MSSA bacteremia, persistent Patient left AMA during last hospital stay.  Blood culture on admission (5/8) positive for MSSA. 2D echo unremarkable.  Refused TEE, although was cleared by neurosurgery for rate regarding his C-spine fracture.  Repeat blood culture on 5/10, negative. --IV nafcillin for total 6 weeks (until 04/26/2020).   --Needs to be treated for full course of antibiotic in the hospital.   Acute pulmonary edema - resolved Unclear etiology.  2D echo from 5/8 with normal EF and no valvular  abnormality.  Patient refused TEE.  Repeated 2D echo on 6/1 which did not show any new valvular issues, EF 55-60%.   --Monitor strict I's/O.   --As needed IV Lasix.    Cervical and thoracic spine osteomyelitis Seen on MRI of the cervical spine from 5/27 (done with concern for myositis on MRI of the left shoulder).  Showed progressive perivertebral soft tissue swelling with concern for osteomyelitis of C1-C5, T2 and T3 vertebral bodies.  Also has ventral epidural phlegmon extending from C2-C5 and dorsal epidural phlegmon extending from C3-C4-6 6-C7, without discrete epidural abscess, high-grade stenosis or cord signal abnormality.  Also has an acute to subacute mild T3 superior endplate compression fracture. --ID recommends no escalation of antibiotic.   --Neurosurgery consulted, following - will consider open biopsy if persistent despite antibiotics  Diabetes mellitus type 2, poorly controlled with hyper and hypoglycemia A1c of 12.6.  Presented with recurrent DKA which has resolved.  Currently on Lantus with Premeal aspart and sliding scale coverage.  Needs close CBG monitoring.  Minimally displaced C4 fracture Recommendation to wear Aspen collar at all times but patient refusing.   Outpatient follow-up with neurosurgery Patsey Berthold, NP).  Essential hypertension Stable.  Continue amlodipine and losartan.  Ongoing cocaine use Urine drug screen positive this admission.  He reportedly was getting agitated past 2 days and per sister he was supposed to receive paycheck with which he could buy some drugs.  On as needed clonazepam and Haldol.  Acute kidney injury Prerenal secondary to DKA.  Resolved.  Issues with decision-making capacity Seen by psychiatry multiple times this admission and has capacity.  Cannot be involuntarily committed if he decides to leave AMA.   Patient BMI: Body mass index is 22.24 kg/m.   DVT prophylaxis: Lovenox  Diet:  Diet Orders (From admission,  onward)    Start     Ordered   03/15/20 1440  Diet Carb Modified Fluid consistency: Thin; Room service appropriate? Yes with Assist  Diet effective now    Question Answer Comment  Diet-HS Snack? Nothing   Calorie Level Medium 1600-2000   Fluid consistency: Thin   Room service appropriate? Yes with Assist      03/15/20 1439            Code Status: Full Code    Subjective 04/10/20    No acute events or complaints reported.  Neck pain little better.  No hypoglycemia (lowest 79) after reducing SSI yesterday.   Disposition Plan & Communication   Status is: Inpatient  Remains inpatient appropriate because:IV treatments appropriate due to intensity of illness or inability to take PO   Dispo: The patient is from: Home              Anticipated d/c is to: Home              Anticipated d/c date is: > 3 days              Patient currently is not medically stable to d/c.   Family Communication: none at bedside, will attempt to call    Consults, Procedures, Significant Events   Consultants:   Neurosurgery  Infectious Disease  Cardiology  Procedures:   MRI Cervical Spine  2D Echo  Antimicrobials:   IV Nafcillin  5/21 >>    Objective   Vitals:   04/09/20 0457 04/09/20 1141 04/09/20 1937 04/10/20 0404  BP: (!) 148/93 (!) 152/93 (!) 143/92 (!) 164/98  Pulse: 89 98 90 90  Resp: 20 20 18    Temp: (!) 97.5 F (36.4 C) 97.9 F (36.6 C) 98.8 F (37.1 C) 98.3 F (36.8 C)  TempSrc: Oral Oral Oral Oral  SpO2: 97% 96% 98% 99%  Weight:      Height:        Intake/Output Summary (Last 24 hours) at 04/10/2020 0815 Last data filed at 04/10/2020 06/10/2020 Gross per 24 hour  Intake 972.3 ml  Output 1000 ml  Net -27.7 ml   Filed Weights   03/13/20 1654 03/13/20 2055 04/01/20 2230  Weight: 59 kg 60.3 kg 70.3 kg    Physical Exam:  General exam: awake, alert, no acute distress, disheveled appearing Respiratory system: CTAB, no wheezes, rales or rhonchi, normal respiratory  effort. Cardiovascular system: normal S1/S2, RRR, no pedal edema.   Extremities: moves all, no cyanosis, normal tone   Labs   Data Reviewed: I have personally reviewed following labs and imaging studies  CBC: Recent Labs  Lab 04/05/20 0348  WBC 8.0  HGB 9.4*  HCT 29.0*  MCV 91.5  PLT 364   Basic Metabolic Panel: Recent Labs  Lab 04/05/20 0348 04/07/20 0408  NA 140 142  K 3.9 3.9  CL 109 109  CO2 24 25  GLUCOSE 169* 287*  BUN 28* 32*  CREATININE 0.55* 0.87  CALCIUM 8.1* 8.3*   GFR: Estimated Creatinine Clearance: 88.7 mL/min (by C-G formula based on SCr of 0.87 mg/dL). Liver Function Tests: Recent Labs  Lab 04/05/20 [phone]  AST 13*  ALT 11  ALKPHOS 77  BILITOT 0.7  PROT 5.7*  ALBUMIN 2.0*   No results for input(s): LIPASE, AMYLASE in the last 168 hours. No results for input(s): AMMONIA in the last 168 hours. Coagulation Profile: No results for input(s): INR, PROTIME in the last 168 hours. Cardiac Enzymes: No results for input(s): CKTOTAL, CKMB, CKMBINDEX, TROPONINI in the last 168 hours. BNP (last 3 results) No results for input(s): PROBNP in the last 8760 hours. HbA1C: No results for input(s): HGBA1C in the last 72 hours. CBG: Recent Labs  Lab 04/09/20 0816 04/09/20 1139 04/09/20 1725 04/09/20 2222 04/10/20 0728  GLUCAP 182* 79 313* 285* 165*   Lipid Profile: No results for input(s): CHOL, HDL, LDLCALC, TRIG, CHOLHDL, LDLDIRECT in the last 72 hours. Thyroid Function Tests: No results for input(s): TSH, T4TOTAL, FREET4, T3FREE, THYROIDAB in the last 72 hours. Anemia Panel: No results for input(s): VITAMINB12, FOLATE, FERRITIN, TIBC, IRON, RETICCTPCT in the last 72 hours. Sepsis Labs: No results for input(s): PROCALCITON, LATICACIDVEN in the last 168 hours.  Recent Results (from the past 240 hour(s))  Culture, blood (Routine X 2) w Reflex to ID Panel     Status: None   Collection Time: 04/01/20 10:38 PM   Specimen: BLOOD LEFT HAND  Result  Value Ref Range Status   Specimen Description BLOOD LEFT HAND  Final   Special Requests   Final    BOTTLES DRAWN AEROBIC AND ANAEROBIC Blood Culture adequate volume   Culture   Final    NO GROWTH 5 DAYS Performed at [facility], [address] [place], [place] [address]    Report Status 04/06/2020 FINAL  Final  Culture, blood (Routine X 2) w Reflex to ID Panel     Status: None   Collection Time: 04/01/20 10:38 PM   Specimen: Left Antecubital; Blood  Result Value Ref Range Status   Specimen Description LEFT ANTECUBITAL  Final   Special Requests   Final    BOTTLES DRAWN AEROBIC AND ANAEROBIC Blood Culture adequate volume   Culture   Final    NO GROWTH 5 DAYS Performed at [facility], [address] [place], [place] [address]    Report Status 04/06/2020 FINAL  Final  MRSA PCR Screening     Status: None   Collection Time: 04/01/20 11:20 PM   Specimen: Nasopharyngeal  Result Value Ref Range Status   MRSA by PCR NEGATIVE NEGATIVE Final    Comment:        The GeneXpert MRSA Assay (FDA approved for NASAL specimens only), is one component of a comprehensive MRSA colonization surveillance program. It is not intended to diagnose MRSA infection nor to guide or monitor treatment for MRSA infections. Performed at [facility], [address], [place], [place] [address]       Imaging Studies   No results found.   Medications   Scheduled Meds: . amLODipine  10 mg Oral Daily  . aspirin EC  81 mg Oral Daily  . chlorhexidine  15 mL Mouth Rinse BID  . Chlorhexidine Gluconate Cloth  6 each Topical Daily  . clonazePAM  0.5 mg Oral BID  . DULoxetine  30 mg Oral Daily  . enoxaparin (LOVENOX) injection  40 mg Subcutaneous Q24H  . famotidine  20 mg Oral Daily  . feeding supplement (GLUCERNA SHAKE)  237 mL Oral BID BM  . furosemide  40 mg Oral BID  . haloperidol  0.25 mg Oral BID  . insulin  aspart  0-5 Units Subcutaneous QHS  . insulin aspart  0-9  Units Subcutaneous TID WC  . insulin aspart  12 Units Subcutaneous TID WC  . insulin glargine  32 Units Subcutaneous Daily  . losartan  50 mg Oral Daily  . mouth rinse  15 mL Mouth Rinse q12n4p  . methocarbamol  500 mg Oral TID  . multivitamin with minerals  1 tablet Oral Daily  . OLANZapine  5 mg Oral QHS  . polyethylene glycol  17 g Oral Daily  . predniSONE  60 mg Oral Q breakfast   Continuous Infusions: . sodium chloride 20.8 mL/hr at 04/01/20 0938  . nafcillin (NAFCIL) continuous infusion 20.8 mL/hr at 04/10/20 0212       LOS: 28 days    Time spent: 15 minutes    Pennie Banter, DO [facility]  04/10/2020, 8:15 AM    If 7PM-7AM, please contact night-coverage. How to contact the [facility] Attending or Consulting provider 7A - 7P or covering provider during after hours 7P -7A, for this patient?    1. Check the care team in [facility] and look for a) attending/consulting TRH provider listed and b) the [facility] team listed 2. Log into [email or web address] and use [facility]'s universal password to access. If you do not have the password, please contact the hospital operator. 3. Locate the [facility] provider you are looking for under [facility] and page to a number that you can be directly reached. 4. If you still have difficulty reaching the provider, please page the [facility] (Director on Call) for the Hospitalists listed on amion for assistance.

## 2020-04-11 LAB — GLUCOSE, CAPILLARY
Glucose-Capillary: 159 mg/dL — ABNORMAL HIGH (ref 70–99)
Glucose-Capillary: 99 mg/dL (ref 70–99)
Glucose-Capillary: 205 mg/dL — ABNORMAL HIGH (ref 70–99)
Glucose-Capillary: 219 mg/dL — ABNORMAL HIGH (ref 70–99)

## 2020-04-11 MED ORDER — GABAPENTIN 600 MG PO TABS
300.0000 mg | ORAL_TABLET | Freq: Two times a day (BID) | ORAL | Status: DC
  Administered 2020-04-11 – 2020-04-28 (×35): 300 mg via ORAL
  Filled 2020-04-11 (×35): qty 1

## 2020-04-11 MED ORDER — LOSARTAN POTASSIUM 50 MG PO TABS
100.0000 mg | ORAL_TABLET | Freq: Every day | ORAL | Status: DC
  Administered 2020-04-11 – 2020-04-28 (×18): 100 mg via ORAL
  Filled 2020-04-11 (×18): qty 2

## 2020-04-11 NOTE — Progress Notes (Signed)
PROGRESS NOTE    Spencer [name]   ZOX:[id number]  DOB: 08/06/1961  PCP: McLean-Scocuzza, Pasty Spillers, MD    DOA: 03/13/2020 LOS: 63   Brief Narrative   59 year old male with history of uncontrolled type 2 diabetes mellitus, cocaine use, brain aneurysm, hypertension, GERD recently hospitalized on 4/28 for DKA, MSSA bacteremia and AKI but signed out AMA on 5/5 was brought by EMS with hyperglycemia and encephalopathy.  In the ED was found to be in DKA with high anion gap of 30.  Given aggressive IV hydration and insulin drip with resolution of DKA.  Hospital course prolonged due to presence of recurrent MSSA bacteremia.    DKA resolved.. The patient will need to complete 6 weeks of IV nafcillin prior to discharge per ID.  Last day of therapy would be 04/26/2020.   On the evening of 04/03/2020 the patient developed acute flash pulmonary edema. He was transferred to the ICU and given diuresis and placed on BIPAP.  He had similar presentation on 5/27 requiring transfer to ICU.    Assessment & Plan   Principal Problem:   MSSA bacteremia Active Problems:   DKA (diabetic ketoacidoses) (HCC)   Hypertension   Diabetic neuropathy (HCC)   DM2 (diabetes mellitus, type 2) (HCC)   History of cocaine abuse (HCC)   Personal history of subdural hematoma   AKI (acute kidney injury) (HCC)   GERD (gastroesophageal reflux disease)   C4 cervical fracture (HCC)   Cigarette smoker   Pressure injury of skin   MSSA bacteremia, persistent Patient left AMA during last hospital stay.  Blood culture on admission (5/8) positive for MSSA. 2D echo unremarkable.  Refused TEE, although was cleared by neurosurgery for rate regarding his C-spine fracture.  Repeat blood culture on 5/10, negative. --IV nafcillin for total 6 weeks (until 04/26/2020).   --Needs to be treated for full course of antibiotic in the hospital.   Acute pulmonary edema - resolved Unclear etiology.  2D echo from 5/8 with normal EF and no valvular  abnormality.  Patient refused TEE.  Repeated 2D echo on 6/1 which did not show any new valvular issues, EF 55-60%.   --Monitor strict I's/O.   --As needed IV Lasix.    Left upper extremity pain and weakness - 6/6 he describes what sounds like neuropathic pain, and progressive weakness of the left arm, today unable to raise against gravity.   Say he had same thing in past, related to his neck problems.  Suspect possible cervical myelopathy/radiculopathy.    --Trial of low dose gabapentin ordered.   --Will ask neurosurgery to see again tomorrow before repeating cervical MRI.    Cervical and thoracic spine osteomyelitis Seen on MRI of the cervical spine from 5/27 (done with concern for myositis on MRI of the left shoulder).  Showed progressive perivertebral soft tissue swelling with concern for osteomyelitis of C1-C5, T2 and T3 vertebral bodies.  Also has ventral epidural phlegmon extending from C2-C5 and dorsal epidural phlegmon extending from C3-C4-6 6-C7, without discrete epidural abscess, high-grade stenosis or cord signal abnormality.  Also has an acute to subacute mild T3 superior endplate compression fracture. --ID recommends no escalation of antibiotic.   --Neurosurgery consulted, following - will consider open biopsy if persistent despite antibiotics  Diabetes mellitus type 2, poorly controlled with hyper and hypoglycemia A1c of 12.6.  Presented with recurrent DKA which has resolved.  Currently on Lantus with Premeal aspart and sliding scale coverage.  Needs close CBG monitoring.  Minimally displaced C4  fracture Recommendation to wear Aspen collar at all times but patient refusing.   Outpatient follow-up with neurosurgery Patsey Berthold, NP).  Essential hypertension Stable.  Continue amlodipine and losartan.  Increased losartan to 100 mg as BP uncontrolled.  PRN hydralazine.  Monitor.  Ongoing cocaine use Urine drug screen positive this admission.  He reportedly was getting  agitated past 2 days and per sister he was supposed to receive paycheck with which he could buy some drugs.  On as needed clonazepam and Haldol.  Acute kidney injury Prerenal secondary to DKA.  Resolved.  Issues with decision-making capacity Seen by psychiatry multiple times this admission and has capacity.  Cannot be involuntarily committed if he decides to leave AMA.  Depression/Anxiety Continue Klonopin BID, Cymbalta  GERD Continue Pepcid     Patient BMI: Body mass index is 22.24 kg/m.   DVT prophylaxis: Lovenox  Diet:  Diet Orders (From admission, onward)    Start     Ordered   03/15/20 1440  Diet Carb Modified Fluid consistency: Thin; Room service appropriate? Yes with Assist  Diet effective now    Question Answer Comment  Diet-HS Snack? Nothing   Calorie Level Medium 1600-2000   Fluid consistency: Thin   Room service appropriate? Yes with Assist      03/15/20 1439            Code Status: Full Code    Subjective 04/11/20    Patient seen at bedside this AM.  He is having left arm pain today, and weakness.  Says he can't use the left arm at all.  Says it was like this before, but resolved over time after neck surgery(?).  Says it's become progressively weak over past few days, but has not mentioned until today.  He cannot characterize the pain, says it just hurts.  No spasms.  He otherwise denies acute complaints other than tramadol and norco are not working well enough.  Says he takes gabapentin at home and agreeable to try for the arm pain.    Disposition Plan & Communication   Status is: Inpatient  Remains inpatient appropriate because:IV treatments appropriate due to intensity of illness or inability to take PO   Dispo: The patient is from: Home              Anticipated d/c is to: Home              Anticipated d/c date is: > 3 days (planned final day abx 6/21)              Patient currently is not medically stable to d/c.   Family Communication: none  at bedside, will attempt to call    Consults, Procedures, Significant Events   Consultants:   Neurosurgery  Infectious Disease  Cardiology  Procedures:   MRI Cervical Spine  2D Echo  Antimicrobials:   IV Nafcillin  5/21 >>    Objective   Vitals:   04/10/20 0404 04/10/20 1512 04/10/20 1917 04/11/20 0404  BP: (!) 164/98 (!) 139/92 (!) 156/99 (!) 157/102  Pulse: 90 92 81 96  Resp:  16 20 20   Temp: 98.3 F (36.8 C) 98.1 F (36.7 C) 98.2 F (36.8 C) (!) 97.3 F (36.3 C)  TempSrc: Oral Oral Oral Oral  SpO2: 99% 100% 97% 99%  Weight:      Height:        Intake/Output Summary (Last 24 hours) at 04/11/2020 0747 Last data filed at 04/11/2020 0600 Gross per 24 hour  Intake 699.19 ml  Output 1100 ml  Net -400.81 ml   Filed Weights   03/13/20 1654 03/13/20 2055 04/01/20 2230  Weight: 59 kg 60.3 kg 70.3 kg    Physical Exam:  General exam: awake, alert, no acute distress, disheveled appearing Respiratory system: CTAB, no wheezes, rales or rhonchi, normal respiratory effort. Cardiovascular system: normal S1/S2, RRR, no pedal edema.   Neurologic: left upper extremity weak, unable to keep raised against gravity, intact grip strength.  Other extremities with motor intact.  No spasticity noted. Extremities: pain with passive ROM in left shoulder flexion and abduction, no peripheral edema   Labs   Data Reviewed: I have personally reviewed following labs and imaging studies  CBC: Recent Labs  Lab 04/05/20 0348  WBC 8.0  HGB 9.4*  HCT 29.0*  MCV 91.5  PLT 578   Basic Metabolic Panel: Recent Labs  Lab 04/05/20 0348 04/07/20 0408  NA 140 142  K 3.9 3.9  CL 109 109  CO2 24 25  GLUCOSE 169* 287*  BUN 28* 32*  CREATININE 0.55* 0.87  CALCIUM 8.1* 8.3*   GFR: Estimated Creatinine Clearance: 88.7 mL/min (by C-G formula based on SCr of 0.87 mg/dL). Liver Function Tests: Recent Labs  Lab 04/05/20 0348  AST 13*  ALT 11  ALKPHOS 77  BILITOT 0.7  PROT 5.7*    ALBUMIN 2.0*   No results for input(s): LIPASE, AMYLASE in the last 168 hours. No results for input(s): AMMONIA in the last 168 hours. Coagulation Profile: No results for input(s): INR, PROTIME in the last 168 hours. Cardiac Enzymes: No results for input(s): CKTOTAL, CKMB, CKMBINDEX, TROPONINI in the last 168 hours. BNP (last 3 results) No results for input(s): PROBNP in the last 8760 hours. HbA1C: No results for input(s): HGBA1C in the last 72 hours. CBG: Recent Labs  Lab 04/09/20 2222 04/10/20 0728 04/10/20 1137 04/10/20 1644 04/10/20 2111  GLUCAP 285* 165* 111* 191* 96   Lipid Profile: No results for input(s): CHOL, HDL, LDLCALC, TRIG, CHOLHDL, LDLDIRECT in the last 72 hours. Thyroid Function Tests: No results for input(s): TSH, T4TOTAL, FREET4, T3FREE, THYROIDAB in the last 72 hours. Anemia Panel: No results for input(s): VITAMINB12, FOLATE, FERRITIN, TIBC, IRON, RETICCTPCT in the last 72 hours. Sepsis Labs: No results for input(s): PROCALCITON, LATICACIDVEN in the last 168 hours.  Recent Results (from the past 240 hour(s))  Culture, blood (Routine X 2) w Reflex to ID Panel     Status: None   Collection Time: 04/01/20 10:38 PM   Specimen: BLOOD LEFT HAND  Result Value Ref Range Status   Specimen Description BLOOD LEFT HAND  Final   Special Requests   Final    BOTTLES DRAWN AEROBIC AND ANAEROBIC Blood Culture adequate volume   Culture   Final    NO GROWTH 5 DAYS Performed at [facility], [address], [place], [place] [address]    Report Status 04/06/2020 FINAL  Final  Culture, blood (Routine X 2) w Reflex to ID Panel     Status: None   Collection Time: 04/01/20 10:38 PM   Specimen: Left Antecubital; Blood  Result Value Ref Range Status   Specimen Description LEFT ANTECUBITAL  Final   Special Requests   Final    BOTTLES DRAWN AEROBIC AND ANAEROBIC Blood Culture adequate volume   Culture   Final    NO GROWTH 5 DAYS Performed at [facility], [address] [place], [place] [address]    Report Status 04/06/2020  FINAL  Final  MRSA PCR Screening     Status: None   Collection Time: 04/01/20 11:20 PM   Specimen: Nasopharyngeal  Result Value Ref Range Status   MRSA by PCR NEGATIVE NEGATIVE Final    Comment:        The GeneXpert MRSA Assay (FDA approved for NASAL specimens only), is one component of a comprehensive MRSA colonization surveillance program. It is not intended to diagnose MRSA infection nor to guide or monitor treatment for MRSA infections. Performed at [facility], [address], [place], [place] [address]       Imaging Studies   No results found.   Medications   Scheduled Meds:  amLODipine  10 mg Oral Daily   aspirin EC  81 mg Oral Daily   chlorhexidine  15 mL Mouth Rinse BID   Chlorhexidine Gluconate Cloth  6 each Topical Daily   clonazePAM  0.5 mg Oral BID   DULoxetine  30 mg Oral Daily   enoxaparin (LOVENOX) injection  40 mg Subcutaneous Q24H   famotidine  20 mg Oral Daily   feeding supplement (GLUCERNA SHAKE)  237 mL Oral BID BM   furosemide  40 mg Oral BID   haloperidol  0.25 mg Oral BID   insulin aspart  0-5 Units Subcutaneous QHS   insulin aspart  0-9 Units Subcutaneous TID WC   insulin aspart  12 Units Subcutaneous TID WC   insulin glargine  32 Units Subcutaneous Daily   losartan  100 mg Oral Daily   mouth rinse  15 mL Mouth Rinse q12n4p   methocarbamol  500 mg Oral TID   multivitamin with minerals  1 tablet Oral Daily   OLANZapine  5 mg Oral QHS   polyethylene glycol  17 g Oral Daily   predniSONE  60 mg Oral Q breakfast   Continuous Infusions:  sodium chloride 20.8 mL/hr at 04/01/20 0938   nafcillin (NAFCIL) continuous infusion 20.8 mL/hr at 04/11/20 0600       LOS: 29 days    Time spent: 15 minutes    Pennie Banter, DO [facility]  04/11/2020, 7:47 AM    If 7PM-7AM, please contact night-coverage. How to contact  the [facility] Attending or Consulting provider 7A - 7P or covering provider during after hours 7P -7A, for this patient?    1. Check the care team in [facility] and look for a) attending/consulting TRH provider listed and b) the [facility] team listed 2. Log into [email or web address] and use [facility]'s universal password to access. If you do not have the password, please contact the hospital operator. 3. Locate the [facility] provider you are looking for under [facility] and page to a number that you can be directly reached. 4. If you still have difficulty reaching the provider, please page the [facility] (Director on Call) for the Hospitalists listed on amion for assistance.

## 2020-04-12 DIAGNOSIS — Z7189 Other specified counseling: Secondary | ICD-10-CM

## 2020-04-12 DIAGNOSIS — N179 Acute kidney failure, unspecified: Secondary | ICD-10-CM

## 2020-04-12 DIAGNOSIS — Z515 Encounter for palliative care: Secondary | ICD-10-CM

## 2020-04-12 LAB — GLUCOSE, CAPILLARY
Glucose-Capillary: 185 mg/dL — ABNORMAL HIGH (ref 70–99)
Glucose-Capillary: 99 mg/dL (ref 70–99)
Glucose-Capillary: 335 mg/dL — ABNORMAL HIGH (ref 70–99)
Glucose-Capillary: 410 mg/dL — ABNORMAL HIGH (ref 70–99)

## 2020-04-12 MED ORDER — PREDNISONE 20 MG PO TABS
40.0000 mg | ORAL_TABLET | Freq: Every day | ORAL | Status: AC
  Administered 2020-04-14: 40 mg via ORAL
  Filled 2020-04-12: qty 2

## 2020-04-12 MED ORDER — PREDNISONE 20 MG PO TABS
30.0000 mg | ORAL_TABLET | Freq: Every day | ORAL | Status: AC
  Administered 2020-04-15: 30 mg via ORAL
  Filled 2020-04-12: qty 2

## 2020-04-12 MED ORDER — INSULIN ASPART 100 UNIT/ML ~~LOC~~ SOLN
10.0000 [IU] | Freq: Once | SUBCUTANEOUS | Status: AC
  Administered 2020-04-12: 10 [IU] via SUBCUTANEOUS
  Filled 2020-04-12: qty 1

## 2020-04-12 MED ORDER — PREDNISONE 20 MG PO TABS
20.0000 mg | ORAL_TABLET | Freq: Every day | ORAL | Status: AC
  Administered 2020-04-16: 20 mg via ORAL
  Filled 2020-04-12: qty 1

## 2020-04-12 MED ORDER — PREDNISONE 20 MG PO TABS
50.0000 mg | ORAL_TABLET | Freq: Every day | ORAL | Status: AC
  Administered 2020-04-13: 50 mg via ORAL
  Filled 2020-04-12: qty 3

## 2020-04-12 MED ORDER — PREDNISONE 20 MG PO TABS
10.0000 mg | ORAL_TABLET | Freq: Every day | ORAL | Status: AC
  Administered 2020-04-17: 10 mg via ORAL
  Filled 2020-04-12: qty 1

## 2020-04-12 NOTE — Progress Notes (Signed)
PROGRESS NOTE    Spencer [name]   QBH:[id number]  DOB: 21-Nov-1960  PCP: McLean-Scocuzza, Pasty Spillers, MD    DOA: 03/13/2020 LOS: 80   Brief Narrative   59 year old male with history of uncontrolled type 2 diabetes mellitus, cocaine use, brain aneurysm, hypertension, GERD recently hospitalized on 4/28 for DKA, MSSA bacteremia and AKI but signed out AMA on 5/5 was brought by EMS with hyperglycemia and encephalopathy.  In the ED was found to be in DKA with high anion gap of 30.  Given aggressive IV hydration and insulin drip with resolution of DKA.  Hospital course prolonged due to presence of recurrent MSSA bacteremia.    DKA resolved.. The patient will need to complete 6 weeks of IV nafcillin prior to discharge per ID.  Last day of therapy would be 04/26/2020.   On the evening of 04/03/2020 the patient developed acute flash pulmonary edema. He was transferred to the ICU and given diuresis and placed on BIPAP.  He had similar presentation on 5/27 requiring transfer to ICU.    Assessment & Plan   Principal Problem:   MSSA bacteremia Active Problems:   DKA (diabetic ketoacidoses) (HCC)   Hypertension   Diabetic neuropathy (HCC)   DM2 (diabetes mellitus, type 2) (HCC)   History of cocaine abuse (HCC)   Personal history of subdural hematoma   AKI (acute kidney injury) (HCC)   GERD (gastroesophageal reflux disease)   C4 cervical fracture (HCC)   Cigarette smoker   Pressure injury of skin   MSSA bacteremia, persistent Patient left AMA during last hospital stay.  Blood culture on admission (5/8) positive for MSSA. 2D echo unremarkable.  Refused TEE initially but now agreeable, although was cleared by neurosurgery for rate regarding his C-spine fracture.  Repeat blood culture on 5/10, negative. --IV nafcillin for total 6 weeks (until 04/26/2020).   --Needs to be treated for full course of antibiotic in the hospital. - check CBC, BMP in am - start tapering steroids 10 mg daily until  done  Acute pulmonary edema - resolved Unclear etiology.  2D echo from 5/8 with normal EF and no valvular abnormality.  Patient refused TEE.  Repeated 2D echo on 6/1 which did not show any new valvular issues, EF 55-60%.   --Monitor strict I's/O.   --As needed IV Lasix.    Left upper extremity pain and weakness - 6/6 he describes what sounds like neuropathic pain, and progressive weakness of the left arm, today unable to raise against gravity.   Say he had same thing in past, related to his neck problems.  Suspect possible cervical myelopathy/radiculopathy.    --Trial of low dose gabapentin ordered.   --Will ask neurosurgery to see again tomorrow before repeating cervical MRI.    Cervical and thoracic spine osteomyelitis Seen on MRI of the cervical spine from 5/27 (done with concern for myositis on MRI of the left shoulder).  Showed progressive perivertebral soft tissue swelling with concern for osteomyelitis of C1-C5, T2 and T3 vertebral bodies.  Also has ventral epidural phlegmon extending from C2-C5 and dorsal epidural phlegmon extending from C3-C4-6 6-C7, without discrete epidural abscess, high-grade stenosis or cord signal abnormality.  Also has an acute to subacute mild T3 superior endplate compression fracture. --ID recommends no escalation of antibiotic.   --Neurosurgery consulted, following - will consider open biopsy if persistent despite antibiotics  Diabetes mellitus type 2, poorly controlled with hyper and hypoglycemia A1c of 12.6.  Presented with recurrent DKA which has resolved.  Currently  on Lantus with Premeal aspart and sliding scale coverage.  Needs close CBG monitoring.  Minimally displaced C4 fracture Recommendation to wear Aspen collar at all times but patient refusing.   Outpatient follow-up with neurosurgery Patsey Berthold, NP).  Essential hypertension Stable.  Continue amlodipine and losartan.  Increased losartan to 100 mg as BP uncontrolled.  PRN hydralazine.   Monitor.  Ongoing cocaine use Urine drug screen positive this admission.  He reportedly was getting agitated past 2 days and per sister he was supposed to receive paycheck with which he could buy some drugs.  On as needed clonazepam and Haldol.  Acute kidney injury Prerenal secondary to DKA.  Resolved.  Issues with decision-making capacity Seen by psychiatry multiple times this admission and has capacity.  Cannot be involuntarily committed if he decides to leave AMA.  Depression/Anxiety Continue Klonopin BID, Cymbalta  GERD Continue Pepcid     Patient BMI: Body mass index is 22.24 kg/m.   DVT prophylaxis: Lovenox  Diet:  Diet Orders (From admission, onward)    Start     Ordered   03/15/20 1440  Diet Carb Modified Fluid consistency: Thin; Room service appropriate? Yes with Assist  Diet effective now    Question Answer Comment  Diet-HS Snack? Nothing   Calorie Level Medium 1600-2000   Fluid consistency: Thin   Room service appropriate? Yes with Assist      03/15/20 1439            Code Status: Full Code    Subjective 04/12/20    No new complaints, asking when he might be going home  Disposition Plan & Communication   Status is: Inpatient  Remains inpatient appropriate because:IV treatments appropriate due to intensity of illness or inability to take PO   Dispo: The patient is from: Home              Anticipated d/c is to: Home              Anticipated d/c date is: > 3 days (planned final day abx 6/21)              Patient currently is not medically stable to d/c.needs to finish IV Abx while inpt    Family Communication: none at bedside, will attempt to call    Consults, Procedures, Significant Events   Consultants:   Neurosurgery  Infectious Disease  Cardiology  Procedures:   MRI Cervical Spine  2D Echo  Antimicrobials:   IV Nafcillin  5/21 >>    Objective   Vitals:   04/11/20 1924 04/12/20 0332 04/12/20 0735 04/12/20 1652  BP:  (!) 146/94 (!) 153/97 (!) 148/90 (!) 156/90  Pulse: 85 78 84 86  Resp: 18 18 18    Temp: 98.5 F (36.9 C) 97.6 F (36.4 C) 98.2 F (36.8 C) 98.1 F (36.7 C)  TempSrc: Oral Oral Oral Oral  SpO2: 98% 97% 98% 99%  Weight:      Height:        Intake/Output Summary (Last 24 hours) at 04/12/2020 1858 Last data filed at 04/12/2020 1855 Gross per 24 hour  Intake 1567.1 ml  Output 1700 ml  Net -132.9 ml   Filed Weights   03/13/20 1654 03/13/20 2055 04/01/20 2230  Weight: 59 kg 60.3 kg 70.3 kg    Physical Exam:  General exam: awake, alert, no acute distress, disheveled appearing Respiratory system: CTAB, no wheezes, rales or rhonchi, normal respiratory effort. Cardiovascular system: normal S1/S2, RRR, no pedal edema.  Neurologic: left upper extremity weak, unable to keep raised against gravity, intact grip strength.  Other extremities with motor intact.  No spasticity noted. Extremities: pain with passive ROM in left shoulder flexion and abduction, no peripheral edema   Labs   Data Reviewed: I have personally reviewed following labs and imaging studies  CBC: No results for input(s): WBC, NEUTROABS, HGB, HCT, MCV, PLT in the last 168 hours. Basic Metabolic Panel: Recent Labs  Lab 04/07/20 0408  NA 142  K 3.9  CL 109  CO2 25  GLUCOSE 287*  BUN 32*  CREATININE 0.87  CALCIUM 8.3*   GFR: Estimated Creatinine Clearance: 88.7 mL/min (by C-G formula based on SCr of 0.87 mg/dL). Liver Function Tests: No results for input(s): AST, ALT, ALKPHOS, BILITOT, PROT, ALBUMIN in the last 168 hours. No results for input(s): LIPASE, AMYLASE in the last 168 hours. No results for input(s): AMMONIA in the last 168 hours. Coagulation Profile: No results for input(s): INR, PROTIME in the last 168 hours. Cardiac Enzymes: No results for input(s): CKTOTAL, CKMB, CKMBINDEX, TROPONINI in the last 168 hours. BNP (last 3 results) No results for input(s): PROBNP in the last 8760 hours. HbA1C: No  results for input(s): HGBA1C in the last 72 hours. CBG: Recent Labs  Lab 04/11/20 1647 04/11/20 2107 04/12/20 0813 04/12/20 1224 04/12/20 1650  GLUCAP 205* 219* 185* 99 335*   Lipid Profile: No results for input(s): CHOL, HDL, LDLCALC, TRIG, CHOLHDL, LDLDIRECT in the last 72 hours. Thyroid Function Tests: No results for input(s): TSH, T4TOTAL, FREET4, T3FREE, THYROIDAB in the last 72 hours. Anemia Panel: No results for input(s): VITAMINB12, FOLATE, FERRITIN, TIBC, IRON, RETICCTPCT in the last 72 hours. Sepsis Labs: No results for input(s): PROCALCITON, LATICACIDVEN in the last 168 hours.  No results found for this or any previous visit (from the past 240 hour(s)).    Imaging Studies   No results found.   Medications   Scheduled Meds: . amLODipine  10 mg Oral Daily  . aspirin EC  81 mg Oral Daily  . chlorhexidine  15 mL Mouth Rinse BID  . Chlorhexidine Gluconate Cloth  6 each Topical Daily  . clonazePAM  0.5 mg Oral BID  . DULoxetine  30 mg Oral Daily  . enoxaparin (LOVENOX) injection  40 mg Subcutaneous Q24H  . famotidine  20 mg Oral Daily  . feeding supplement (GLUCERNA SHAKE)  237 mL Oral BID BM  . furosemide  40 mg Oral BID  . gabapentin  300 mg Oral BID  . haloperidol  0.25 mg Oral BID  . insulin aspart  0-5 Units Subcutaneous QHS  . insulin aspart  0-9 Units Subcutaneous TID WC  . insulin aspart  12 Units Subcutaneous TID WC  . insulin glargine  32 Units Subcutaneous Daily  . losartan  100 mg Oral Daily  . mouth rinse  15 mL Mouth Rinse q12n4p  . methocarbamol  500 mg Oral TID  . multivitamin with minerals  1 tablet Oral Daily  . OLANZapine  5 mg Oral QHS  . polyethylene glycol  17 g Oral Daily  . [START ON 04/13/2020] predniSONE  50 mg Oral Q breakfast   Followed by  . [START ON 04/14/2020] predniSONE  40 mg Oral Q breakfast   Followed by  . [START ON 04/15/2020] predniSONE  30 mg Oral Q breakfast   Followed by  . [START ON 04/16/2020] predniSONE  20 mg Oral  Q breakfast   Followed by  . [START ON 04/17/2020]  predniSONE  10 mg Oral Q breakfast   Continuous Infusions: . sodium chloride 20.8 mL/hr at 04/01/20 0938  . nafcillin (NAFCIL) continuous infusion 12 g (04/12/20 1222)       LOS: 30 days    Time spent: 15 minutes    Daemyn Gariepy Sherryll Burger, DO [facility]  04/12/2020, 6:58 PM    If 7PM-7AM, please contact night-coverage. How to contact the [facility] Attending or Consulting provider 7A - 7P or covering provider during after hours 7P -7A, for this patient?    1. Check the care team in [facility] and look for a) attending/consulting TRH provider listed and b) the [facility] team listed 2. Log into [email or web address] and use [place]'s universal password to access. If you do not have the password, please contact the hospital operator. 3. Locate the [facility] provider you are looking for under [facility] and page to a number that you can be directly reached. 4. If you still have difficulty reaching the provider, please page the [facility] (Director on Call) for the Hospitalists listed on amion for assistance.

## 2020-04-12 NOTE — Plan of Care (Addendum)
PMT note:  Full note to follow. Patient would like full code/full scope care. He states he will wear an Aspen collar if he has one that fits as he has pain and weakness in his arms. He states the current one is too tall. He states he will have the TEE if it is still indicated. His sister is present and requests to be notified when TEE will be done and she will be here at that time. He states he needs a note for superior court 6/19.

## 2020-04-13 ENCOUNTER — Inpatient Hospital Stay: Payer: Medicaid Other

## 2020-04-13 DIAGNOSIS — F17210 Nicotine dependence, cigarettes, uncomplicated: Secondary | ICD-10-CM

## 2020-04-13 LAB — GLUCOSE, CAPILLARY
Glucose-Capillary: 187 mg/dL — ABNORMAL HIGH (ref 70–99)
Glucose-Capillary: 301 mg/dL — ABNORMAL HIGH (ref 70–99)
Glucose-Capillary: 151 mg/dL — ABNORMAL HIGH (ref 70–99)
Glucose-Capillary: 422 mg/dL — ABNORMAL HIGH (ref 70–99)
Glucose-Capillary: 355 mg/dL — ABNORMAL HIGH (ref 70–99)

## 2020-04-13 LAB — CBC
HCT: 32.6 % — ABNORMAL LOW (ref 39.0–52.0)
Hemoglobin: 10.3 g/dL — ABNORMAL LOW (ref 13.0–17.0)
MCH: 30 pg (ref 26.0–34.0)
MCHC: 31.6 g/dL (ref 30.0–36.0)
MCV: 95 fL (ref 80.0–100.0)
Platelets: 349 10*3/uL (ref 150–400)
RBC: 3.43 MIL/uL — ABNORMAL LOW (ref 4.22–5.81)
RDW: 16.7 % — ABNORMAL HIGH (ref 11.5–15.5)
WBC: 8.4 10*3/uL (ref 4.0–10.5)
nRBC: 0 % (ref 0.0–0.2)

## 2020-04-13 LAB — BASIC METABOLIC PANEL
Anion gap: 6 (ref 5–15)
BUN: 33 mg/dL — ABNORMAL HIGH (ref 8–23)
CO2: 26 mmol/L (ref 22–32)
Calcium: 8.3 mg/dL — ABNORMAL LOW (ref 8.9–10.3)
Chloride: 110 mmol/L (ref 98–111)
Creatinine, Ser: 0.79 mg/dL (ref 0.61–1.24)
GFR calc Af Amer: >60 mL/min (ref >60)
GFR calc non Af Amer: >60 mL/min (ref >60)
Glucose, Bld: 213 mg/dL — ABNORMAL HIGH (ref 70–99)
Potassium: 3.7 mmol/L (ref 3.5–5.1)
Sodium: 142 mmol/L (ref 135–145)

## 2020-04-13 NOTE — Progress Notes (Signed)
PROGRESS NOTE    Spencer [name]   OZD:[id number]  DOB: 10-20-1961  PCP: No primary care provider on file.    DOA: 03/13/2020 LOS: 19   Brief Narrative   59 year old male with history of uncontrolled type 2 diabetes mellitus, cocaine use, brain aneurysm, hypertension, GERD recently hospitalized on 4/28 for DKA, MSSA bacteremia and AKI but signed out AMA on 5/5 was brought by EMS with hyperglycemia and encephalopathy.  In the ED was found to be in DKA with high anion gap of 30.  Given aggressive IV hydration and insulin drip with resolution of DKA.  Hospital course prolonged due to presence of recurrent MSSA bacteremia.    DKA resolved.. The patient will need to complete 6 weeks of IV nafcillin prior to discharge per ID.  Last day of therapy would be 04/26/2020.   On the evening of 04/03/2020 the patient developed acute flash pulmonary edema. He was transferred to the ICU and given diuresis and placed on BIPAP.  He had similar presentation on 5/27 requiring transfer to ICU.    Assessment & Plan   Principal Problem:   MSSA bacteremia Active Problems:   DKA (diabetic ketoacidoses) (HCC)   Hypertension   Diabetic neuropathy (HCC)   DM2 (diabetes mellitus, type 2) (HCC)   History of cocaine abuse (HCC)   Personal history of subdural hematoma   AKI (acute kidney injury) (HCC)   GERD (gastroesophageal reflux disease)   C4 cervical fracture (HCC)   Cigarette smoker   Pressure injury of skin   MSSA bacteremia, persistent Patient left AMA during last hospital stay.  Blood culture on admission (5/8) positive for MSSA. 2D echo unremarkable.  Refused TEE initially but now agreeable, although was cleared by neurosurgery for rate regarding his C-spine fracture.  Repeat blood culture on 5/10, negative. --IV nafcillin for total 6 weeks (until 04/26/2020).   --Needs to be treated for full course of antibiotic in the hospital. -Received 50 mg prednisone today.  Taper 10 mg daily until  finished  Acute pulmonary edema - resolved Unclear etiology.  2D echo from 5/8 with normal EF and no valvular abnormality.  Patient refused TEE.  Repeated 2D echo on 6/1 which did not show any new valvular issues, EF 55-60%.   --Monitor strict I's/O.   --As needed IV Lasix.    Left upper extremity pain and weakness - 6/6 he describes what sounds like neuropathic pain, and progressive weakness of the left arm, today unable to raise against gravity.   Say he had same thing in past, related to his neck problems.  Suspect possible cervical myelopathy/radiculopathy.    --Trial of low dose gabapentin ordered.   --Will ask neurosurgery to see again tomorrow before repeating cervical MRI.    Cervical and thoracic spine osteomyelitis Seen on MRI of the cervical spine from 5/27 (done with concern for myositis on MRI of the left shoulder).  Showed progressive perivertebral soft tissue swelling with concern for osteomyelitis of C1-C5, T2 and T3 vertebral bodies.  Also has ventral epidural phlegmon extending from C2-C5 and dorsal epidural phlegmon extending from C3-C4-6 6-C7, without discrete epidural abscess, high-grade stenosis or cord signal abnormality.  Also has an acute to subacute mild T3 superior endplate compression fracture. --ID recommends no escalation of antibiotic.   --Neurosurgery consulted, following - will consider open biopsy if persistent despite antibiotics  Diabetes mellitus type 2, poorly controlled with hyper and hypoglycemia A1c of 12.6.  Presented with recurrent DKA which has resolved.  Currently on  Lantus with Premeal aspart and sliding scale coverage.  Needs close CBG monitoring.  Minimally displaced C4 fracture Recommendation to wear Aspen collar at all times but patient refusing.   Outpatient follow-up with neurosurgery Patsey Berthold, NP).  I have messaged Dr. Adriana Simas today on 6/8  Essential hypertension Stable.  Continue amlodipine and losartan.  Increased losartan to 100  mg as BP uncontrolled.  PRN hydralazine.  Monitor.  Ongoing cocaine use Urine drug screen positive this admission.  He reportedly was getting agitated past 2 days and per sister he was supposed to receive paycheck with which he could buy some drugs.  On as needed clonazepam and Haldol.  Acute kidney injury Prerenal secondary to DKA.  Resolved.  Issues with decision-making capacity Seen by psychiatry multiple times this admission and has capacity.  Cannot be involuntarily committed if he decides to leave AMA.  Depression/Anxiety Continue Klonopin BID, Cymbalta  GERD Continue Pepcid     Patient BMI: Body mass index is 22.24 kg/m.   DVT prophylaxis: Lovenox  Diet:  Diet Orders (From admission, onward)    Start     Ordered   03/15/20 1440  Diet Carb Modified Fluid consistency: Thin; Room service appropriate? Yes with Assist  Diet effective now    Question Answer Comment  Diet-HS Snack? Nothing   Calorie Level Medium 1600-2000   Fluid consistency: Thin   Room service appropriate? Yes with Assist      03/15/20 1439            Code Status: Full Code    Subjective 04/13/20    No new complaints, sugar spiked high (410) this afternoon.  Disposition Plan & Communication   Status is: Inpatient  Remains inpatient appropriate because:IV treatments appropriate due to intensity of illness or inability to take PO   Dispo: The patient is from: Home              Anticipated d/c is to: Home              Anticipated d/c date is: > 3 days (planned final day abx 6/21)              Patient currently is not medically stable to d/c.needs to finish IV Abx while inpt    Family Communication: none at bedside, will attempt to call    Consults, Procedures, Significant Events   Consultants:   Neurosurgery  Infectious Disease  Cardiology  Procedures:   MRI Cervical Spine  2D Echo  Antimicrobials:   IV Nafcillin  5/21 >>    Objective   Vitals:   04/12/20 1949  04/13/20 0509 04/13/20 0912 04/13/20 1255  BP: (!) 138/95 (!) 155/91 (!) 177/99 135/87  Pulse: 87 75 82 89  Resp: 18 20  18   Temp: 98.6 F (37 C) 97.8 F (36.6 C) 98.1 F (36.7 C) 98 F (36.7 C)  TempSrc: Oral Oral Oral Oral  SpO2: 99% 99% 98% 100%  Weight:      Height:        Intake/Output Summary (Last 24 hours) at 04/13/2020 1602 Last data filed at 04/13/2020 1300 Gross per 24 hour  Intake 1288.78 ml  Output 1600 ml  Net -311.22 ml   Filed Weights   03/13/20 1654 03/13/20 2055 04/01/20 2230  Weight: 59 kg 60.3 kg 70.3 kg    Physical Exam:  General exam: awake, alert, no acute distress, disheveled appearing Respiratory system: CTAB, no wheezes, rales or rhonchi, normal respiratory effort. Cardiovascular system: normal S1/S2,  RRR, no pedal edema.   Neurologic: left upper extremity weak, unable to keep raised against gravity, intact grip strength.  Other extremities with motor intact.  No spasticity noted. Extremities: pain with passive ROM in left shoulder flexion and abduction, no peripheral edema   Labs   Data Reviewed: I have personally reviewed following labs and imaging studies  CBC: Recent Labs  Lab 04/13/20 0400  WBC 8.4  HGB 10.3*  HCT 32.6*  MCV 95.0  PLT 409   Basic Metabolic Panel: Recent Labs  Lab 04/07/20 0408 04/13/20 0400  NA 142 142  K 3.9 3.7  CL 109 110  CO2 25 26  GLUCOSE 287* 213*  BUN 32* 33*  CREATININE 0.87 0.79  CALCIUM 8.3* 8.3*   GFR: Estimated Creatinine Clearance: 96.4 mL/min (by C-G formula based on SCr of 0.79 mg/dL). Liver Function Tests: No results for input(s): AST, ALT, ALKPHOS, BILITOT, PROT, ALBUMIN in the last 168 hours. No results for input(s): LIPASE, AMYLASE in the last 168 hours. No results for input(s): AMMONIA in the last 168 hours. Coagulation Profile: No results for input(s): INR, PROTIME in the last 168 hours. Cardiac Enzymes: No results for input(s): CKTOTAL, CKMB, CKMBINDEX, TROPONINI in the last 168  hours. BNP (last 3 results) No results for input(s): PROBNP in the last 8760 hours. HbA1C: No results for input(s): HGBA1C in the last 72 hours. CBG: Recent Labs  Lab 04/12/20 1650 04/12/20 2041 04/13/20 0127 04/13/20 0753 04/13/20 1147  GLUCAP 335* 410* 187* 301* 151*   Lipid Profile: No results for input(s): CHOL, HDL, LDLCALC, TRIG, CHOLHDL, LDLDIRECT in the last 72 hours. Thyroid Function Tests: No results for input(s): TSH, T4TOTAL, FREET4, T3FREE, THYROIDAB in the last 72 hours. Anemia Panel: No results for input(s): VITAMINB12, FOLATE, FERRITIN, TIBC, IRON, RETICCTPCT in the last 72 hours. Sepsis Labs: No results for input(s): PROCALCITON, LATICACIDVEN in the last 168 hours.  No results found for this or any previous visit (from the past 240 hour(s)).    Imaging Studies   No results found.   Medications   Scheduled Meds:  amLODipine  10 mg Oral Daily   aspirin EC  81 mg Oral Daily   chlorhexidine  15 mL Mouth Rinse BID   Chlorhexidine Gluconate Cloth  6 each Topical Daily   clonazePAM  0.5 mg Oral BID   DULoxetine  30 mg Oral Daily   enoxaparin (LOVENOX) injection  40 mg Subcutaneous Q24H   famotidine  20 mg Oral Daily   feeding supplement (GLUCERNA SHAKE)  237 mL Oral BID BM   furosemide  40 mg Oral BID   gabapentin  300 mg Oral BID   haloperidol  0.25 mg Oral BID   insulin aspart  0-5 Units Subcutaneous QHS   insulin aspart  0-9 Units Subcutaneous TID WC   insulin aspart  12 Units Subcutaneous TID WC   insulin glargine  32 Units Subcutaneous Daily   losartan  100 mg Oral Daily   mouth rinse  15 mL Mouth Rinse q12n4p   methocarbamol  500 mg Oral TID   multivitamin with minerals  1 tablet Oral Daily   OLANZapine  5 mg Oral QHS   polyethylene glycol  17 g Oral Daily   [START ON 04/14/2020] predniSONE  40 mg Oral Q breakfast   Followed by   Derrill Memo ON 04/15/2020] predniSONE  30 mg Oral Q breakfast   Followed by   Derrill Memo ON  04/16/2020] predniSONE  20 mg Oral Q breakfast  Followed by   [START ON 04/17/2020] predniSONE  10 mg Oral Q breakfast   Continuous Infusions:  sodium chloride 20.8 mL/hr at 04/01/20 0938   nafcillin (NAFCIL) continuous infusion 12 g (04/13/20 1207)       LOS: 31 days    Time spent: 15 minutes    Takyra Cantrall Sherryll Burger, DO [facility]  04/13/2020, 4:02 PM    If 7PM-7AM, please contact night-coverage. How to contact the [facility] Attending or Consulting provider 7A - 7P or covering provider during after hours 7P -7A, for this patient?    1. Check the care team in [facility] and look for a) attending/consulting TR[facility] provider listed and b) the [facility] team listed 2. Log into [email or web address] and use [place]'s universal password to access. If you do not have the password, please contact the hospital operator. 3. Locate the [facility] provider you are looking for under [facility] and page to a number that you can be directly reached. 4. If you still have difficulty reaching the provider, please page the [facility] (Director on Call) for the Hospitalists listed on amion for assistance.

## 2020-04-13 NOTE — Progress Notes (Signed)
Pt's CBG 410 at 2045. Notified NP. 10 units of insulin ordered. Rechecked at 0130, CBG 187.

## 2020-04-13 NOTE — Progress Notes (Signed)
Inpatient Diabetes Program Recommendations  AACE/ADA: New Consensus Statement on Inpatient Glycemic Control   Target Ranges:  Prepandial:   less than 140 mg/dL      Peak postprandial:   less than 180 mg/dL (1-2 hours)      Critically ill patients:  140 - 180 mg/dL   Results for Spencer [name], Spencer [name] (MRN [id number]) as of 04/13/2020 08:59  Ref. Range 04/12/2020 08:13 04/12/2020 12:24 04/12/2020 16:50 04/12/2020 20:41 04/13/2020 01:27 04/13/2020 07:53  Glucose-Capillary Latest Ref Range: 70 - 99 mg/dL 751 (H)  Novolog 14 units  Lantus 32 units 99 335 (H)  Novolog 19 units 410 (H)  Novolog 10 units 187 (H) 301 (H)   Review of Glycemic Control  Outpatient Diabetes medications: Lantus 18 units daily, Novolog 12 units TID with meals Current orders for Inpatient glycemic control:Lantus 32 units daily, Novolog 12 units TID with meals, Novolog 0-9 units TID with meals, Novolog 0-5 units QHS; Prednisone 50 mg daily (tapering daily)   Inpatient Diabetes Program Recommendations:   Insulin - Meal Coverage: Noted meal coverage was not given with lunch on 04/12/20. Per [facility] patient stated he was not going to eat so meal coverage insulin was not given. However, per charting patient ate 100 of lunch and glucose was 99 mg/dl before lunch and up to 335 mg/dl before supper.  NOTE: Also noted glucose 187 at 1:27 am today and up to 301 mg/dl at 0:25 today. Question if patient ate or drank carbohydrates between those times causing hyperglycemia this morning.   Thanks, Orlando Penner, RN, MSN, CDE Diabetes Coordinator Inpatient Diabetes Program [phone] (Team Pager from 8am to 5pm)

## 2020-04-13 NOTE — Consult Note (Addendum)
Consultation Note Date: 04/13/2020   Patient Name: Spencer [name]  DOB: May 19, 1961  MRN: [id number]  Age / Sex: 59 y.o., male  PCP: No primary care provider on file. Referring Physician: Delfino Lovett, MD  Reason for Consultation: Establishing goals of care  HPI/Patient Profile: Spencer [name] is a 59 y.o. male with medical history significant for type 2 diabetes mellitus, cocaine abuse,brain aneurysm,hypertension, GERD.  Patient was recently admitted on 03/03/20 for DKA, acute renal failure and patient signed out against medical advice on 03/10/2020.  Patient brought in by EMS.  Patient is not sure why he is here.  As per him his sister called EMS.  Patient states his blood glucose levels were very high.  When asked how compliant is he with his insulin he told me that he is taking his insulin but he does have a history of noncompliance.   Clinical Assessment and Goals of Care: Patient is resting in bed with sister at bedside. Patient said little during the conversation. His sister was very vocal about her feelings on his psych evaluations and feels that he is confused and not able to make decisions. He agrees he is confused at times. They discuss their concerns about his aspen c collar. He states it is too tall. He states he is having continued difficulty raising his arms and has pain in his arms.    We discussed his diagnoses, prognosis, GOC, EOL wishes disposition and options.  A detailed discussion was had today regarding advanced directives.  Concepts specific to code status, artifical feeding and hydration, IV antibiotics and rehospitalization were discussed.  The difference between an aggressive medical intervention path and a comfort care path was discussed.  Values and goals of care important to patient and family were attempted to be elicited.  Patient and his sister understand his diagnoses. He would like all  care at this time. He states if things are explained better, and if he is reminded of what is planned, he would be willing to do any procedure.  He states he is willing to have the TEE if needed. He states he would not want to live in a vegitative state, and if he needed ventilator support he would want the vent for "a couple of days" to see if there is something that can be corrected, and if not would want to withdraw care. He would not want a tracheostomy. He would want CPR.      SUMMARY OF RECOMMENDATIONS   Full code/full scope at this time with limit of a few days on a ventilator and no tracheostomy.  He states he needs a letter for a court date 6/19.   Prognosis:   Unable to determine       Primary Diagnoses: Present on Admission:  DKA (diabetic ketoacidoses) (HCC)  MSSA bacteremia  Hypertension  History of cocaine abuse (HCC)  GERD (gastroesophageal reflux disease)  AKI (acute kidney injury) (HCC)  Cigarette smoker  C4 cervical fracture (HCC)  Diabetic neuropathy (HCC)   I have reviewed  the medical record, interviewed the patient and family, and examined the patient. The following aspects are pertinent.  Past Medical History:  Diagnosis Date   Brain aneurysm    Broken bones    clavical, ankle, arms toes wriast    Diabetes mellitus without complication (HCC)    Dysrhythmia    GERD (gastroesophageal reflux disease)    Hypertension    Substance abuse ([place])    Social History   Socioeconomic History   Marital status: Single    Spouse name: Not on file   Number of children: Not on file   Years of education: Not on file   Highest education level: Not on file  Occupational History   Not on file  Tobacco Use   Smoking status: Current Some Day Smoker   Smokeless tobacco: Never Used  Substance and Sexual Activity   Alcohol use: Yes    Alcohol/week: 1.0 standard drinks    Types: 1 Cans of beer per week   Drug use: Not Currently    Types:  Cocaine   Sexual activity: Not on file  Other Topics Concern   Not on file  Social History Narrative   ** Merged History Encounter **       Social Determinants of Health   Financial Resource Strain:    Difficulty of Paying Living Expenses:   Food Insecurity:    Worried About Charity fundraiser in the Last Year:    Arboriculturist in the Last Year:   Transportation Needs:    Film/video editor (Medical):    Lack of Transportation (Non-Medical):   Physical Activity:    Days of Exercise per Week:    Minutes of Exercise per Session:   Stress:    Feeling of Stress :   Social Connections:    Frequency of Communication with Friends and Family:    Frequency of Social Gatherings with Friends and Family:    Attends Religious Services:    Active Member of Clubs or Organizations:    Attends Music therapist:    Marital Status:    Family History  Problem Relation Age of Onset   CAD Sister    Hypertension Sister    Healthy Mother    Healthy Father    Scheduled Meds:  amLODipine  10 mg Oral Daily   aspirin EC  81 mg Oral Daily   chlorhexidine  15 mL Mouth Rinse BID   Chlorhexidine Gluconate Cloth  6 each Topical Daily   clonazePAM  0.5 mg Oral BID   DULoxetine  30 mg Oral Daily   enoxaparin (LOVENOX) injection  40 mg Subcutaneous Q24H   famotidine  20 mg Oral Daily   feeding supplement (GLUCERNA SHAKE)  237 mL Oral BID BM   furosemide  40 mg Oral BID   gabapentin  300 mg Oral BID   haloperidol  0.25 mg Oral BID   insulin aspart  0-5 Units Subcutaneous QHS   insulin aspart  0-9 Units Subcutaneous TID WC   insulin aspart  12 Units Subcutaneous TID WC   insulin glargine  32 Units Subcutaneous Daily   losartan  100 mg Oral Daily   mouth rinse  15 mL Mouth Rinse q12n4p   methocarbamol  500 mg Oral TID   multivitamin with minerals  1 tablet Oral Daily   OLANZapine  5 mg Oral QHS   polyethylene glycol  17 g Oral Daily     [START ON 04/14/2020] predniSONE  40  mg Oral Q breakfast   Followed by   Melene Muller ON 04/15/2020] predniSONE  30 mg Oral Q breakfast   Followed by   Melene Muller ON 04/16/2020] predniSONE  20 mg Oral Q breakfast   Followed by   Melene Muller ON 04/17/2020] predniSONE  10 mg Oral Q breakfast   Continuous Infusions:  sodium chloride 20.8 mL/hr at 04/01/20 0938   nafcillin (NAFCIL) continuous infusion 20.8 mL/hr at 04/13/20 0319   PRN Meds:.sodium chloride, acetaminophen, bisacodyl, bisacodyl, dextrose, haloperidol, hydrALAZINE, HYDROcodone-acetaminophen, ipratropium-albuterol, sodium chloride flush, traMADol Medications Prior to Admission:  Prior to Admission medications   Medication Sig Start Date End Date Taking? Authorizing Provider  amLODipine (NORVASC) 10 MG tablet Take 1 tablet (10 mg total) by mouth daily. 02/18/20  Yes Pennie Banter, DO  aspirin EC 81 MG tablet Take 1 tablet (81 mg total) by mouth daily. 10/26/19 05/13/20 Yes Sreenath, Sudheer B, MD  atorvastatin (LIPITOR) 40 MG tablet Take 1 tablet (40 mg total) by mouth daily at 6 PM. 02/17/20  Yes Esaw Grandchild A, DO  insulin aspart (NOVOLOG) 100 UNIT/ML FlexPen Inject 12 Units into the skin 3 (three) times daily with meals. 02/17/20  Yes Esaw Grandchild A, DO  insulin glargine (LANTUS) 100 unit/mL SOPN Inject 0.18 mLs (18 Units total) into the skin daily. 02/17/20  Yes Esaw Grandchild A, DO  losartan (COZAAR) 50 MG tablet Take 1 tablet (50 mg total) by mouth daily. 02/18/20  Yes Esaw Grandchild A, DO  pantoprazole (PROTONIX) 40 MG tablet Take 1 tablet (40 mg total) by mouth daily. 02/18/20  Yes Esaw Grandchild A, DO   No Known Allergies Review of Systems  Musculoskeletal:       Complains of ongoing weakness and pain in arms.    Physical Exam Pulmonary:     Effort: Pulmonary effort is normal.  Neurological:     Mental Status: He is alert.     Vital Signs: BP (!) 177/99 (BP Location: Left Arm)    Pulse 82    Temp 98.1 F (36.7 C)  (Oral)    Resp 20    Ht 5\' 10"  (1.778 m)    Wt 70.3 kg    SpO2 98%    BMI 22.24 kg/m  Pain Scale: 0-10 POSS *See Group Information*: S-Acceptable,Sleep, easy to arouse Pain Score: Asleep   SpO2: SpO2: 98 % O2 Device:SpO2: 98 % O2 Flow Rate: .O2 Flow Rate (L/min): 2 L/min  IO: Intake/output summary:   Intake/Output Summary (Last 24 hours) at 04/13/2020 06/13/2020 Last data filed at 04/13/2020 06/13/2020 Gross per 24 hour  Intake 1768.78 ml  Output 1600 ml  Net 168.78 ml    LBM: Last BM Date: 04/12/20 Baseline Weight: Weight: 59 kg Most recent weight: Weight: 70.3 kg     Palliative Assessment/Data:     Time In: 3:40 Time Out: 4:30 Time Total: 50 min Greater than 50%  of this time was spent counseling and coordinating care related to the above assessment and plan.  Signed by: 06/12/20, NP   Please contact Palliative Medicine Team phone at [phone] for questions and concerns.  For individual provider: See [phone]

## 2020-04-14 ENCOUNTER — Inpatient Hospital Stay: Payer: Medicaid Other

## 2020-04-14 DIAGNOSIS — S12001A Unspecified nondisplaced fracture of first cervical vertebra, initial encounter for closed fracture: Secondary | ICD-10-CM

## 2020-04-14 LAB — GLUCOSE, CAPILLARY
Glucose-Capillary: 73 mg/dL (ref 70–99)
Glucose-Capillary: 201 mg/dL — ABNORMAL HIGH (ref 70–99)
Glucose-Capillary: 136 mg/dL — ABNORMAL HIGH (ref 70–99)
Glucose-Capillary: 389 mg/dL — ABNORMAL HIGH (ref 70–99)

## 2020-04-14 LAB — SEDIMENTATION RATE: Sed Rate: 64 mm/hr — ABNORMAL HIGH (ref 0–20)

## 2020-04-14 MED ORDER — GADOBUTROL 1 MMOL/ML IV SOLN
7.0000 mL | Freq: Once | INTRAVENOUS | Status: AC | PRN
  Administered 2020-04-14: 7 mL via INTRAVENOUS

## 2020-04-14 NOTE — Progress Notes (Signed)
ID Patient today has an Aspen collar No fever He says that he was seen by neurosurgery and surgery was discussed He wants the neurosurgeon to talk to his sister and dad.  Patient Vitals for the past 24 hrs:  BP Temp Temp src Pulse Resp SpO2  04/14/20 1122 (!) 159/97 97.6 F (36.4 C) Oral 92 16 99 %  04/14/20 1021 138/90 98.6 F (37 C) Oral 80 17 99 %  04/14/20 1018 138/90 -- -- -- -- --  04/14/20 0418 (!) 151/98 97.8 F (36.6 C) Oral 80 16 99 %  04/13/20 2020 139/81 97.9 F (36.6 C) Oral 85 16 98 %  Awake and alert Chest clear to auscultate Heart sounds S1-S2 Abdomen soft Left arm unable to abduct actively can do it passively with another arm weakness present Moves his legs    CBC Latest Ref Rng & Units 04/13/2020 04/05/2020 04/01/2020  WBC 4.0 - 10.5 K/uL 8.4 8.0 10.4  Hemoglobin 13.0 - 17.0 g/dL 10.3(L) 9.4(L) 10.1(L)  Hematocrit 39.0 - 52.0 % 32.6(L) 29.0(L) 31.7(L)  Platelets 150 - 400 K/uL 349 364 506(H)     CMP Latest Ref Rng & Units 04/13/2020 04/07/2020 04/05/2020  Glucose 70 - 99 mg/dL 213(H) 287(H) 169(H)  BUN 8 - 23 mg/dL 33(H) 32(H) 28(H)  Creatinine 0.61 - 1.24 mg/dL 0.79 0.87 0.55(L)  Sodium 135 - 145 mmol/L 142 142 140  Potassium 3.5 - 5.1 mmol/L 3.7 3.9 3.9  Chloride 98 - 111 mmol/L 110 109 109  CO2 22 - 32 mmol/L _0 Calcium 8.9 - 10.3 mg/dL 8.3(L) 8.3(L) 8.1(L)  Total Protein 6.5 - 8.1 g/dL - - 5.7(L)  Total Bilirubin 0.3 - 1.2 mg/dL - - 0.7  Alkaline Phos 38 - 126 U/L - - 77  AST 15 - 41 U/L - - 13(L)  ALT 0 - 44 U/L - - 11   As seen on previous MRI, there is multifocal marrow edema and enhancement involving the bilateral occipital condyles, C1 and C2. On today's CT, there are displaced fractures of the anterior and posterior arches of C1 as well as the tip of the odontoid process. Given the other findings, these fractures may be pathologic.  There are progressive changes of diskitis and osteomyelitis at C4-5 and T2-3 compared with previous  MRI. There is diffuse marrow edema, endplate irregularity and enhancement at these levels. Endplate changes in the superior endplate of T1 are stable without definite abnormality of the intervening C7-T1 disc space to confirm osteomyelitis at this level. Stable findings post C5-7 ACDF.     Impression/recommendation Staph aureus bacteremia: Positive on 03/04/2020 and 03/13/2020. With IV antibiotic the repeat culture on 5/10 and 04/01/2020 have been negative. Extensive  cervical vertebral involvement with known c4 vertebral fracture which was due to trauma .  now has fracture of C1 as well and osteomyelitis at C4-C5 and T2-T3 compared with previous MRI. He has progressed in spite of appropriate antibiotic for the staph aureus. So if this was a bacteria other than staph aureus  in the cervical spine area we need to get either  aspiration or washou for microbiological diagnosis t. If this is progression of his infection in the cervical vertebral area then he needs  surgical intervention as IV antibiotics seem to be not cutting it His thrombocytosis is resolved We will also get an ESR and a CRP to see progression of that Continue IV nafcillin. Will not empirically broaden the coverage as I do not see an added  value Discussed the management with Dr. Izora Ribas and Dr. Manuella Ghazi

## 2020-04-14 NOTE — Progress Notes (Signed)
PROGRESS NOTE    Spencer [name]   VPX:[id number]  DOB: 06/15/1961  PCP: No primary care provider on file.    DOA: 03/13/2020 LOS: 2332   Brief Narrative   59 year old male with history of uncontrolled type 2 diabetes mellitus, cocaine use, brain aneurysm, hypertension, GERD recently hospitalized on 4/28 for DKA, MSSA bacteremia and AKI but signed out AMA on 5/5 was brought by EMS with hyperglycemia and encephalopathy.  In the ED was found to be in DKA with high anion gap of 30.  Given aggressive IV hydration and insulin drip with resolution of DKA.  Hospital course prolonged due to presence of recurrent MSSA bacteremia.    DKA resolved.. The patient will need to complete 6 weeks of IV nafcillin prior to discharge per ID.  Last day of therapy would be 04/26/2020.   On the evening of 04/03/2020 the patient developed acute flash pulmonary edema. He was transferred to the ICU and given diuresis and placed on BIPAP.  He had similar presentation on 5/27 requiring transfer to ICU.    Assessment & Plan   Principal Problem:   MSSA bacteremia Active Problems:   DKA (diabetic ketoacidoses) (HCC)   Hypertension   Diabetic neuropathy (HCC)   DM2 (diabetes mellitus, type 2) (HCC)   History of cocaine abuse (HCC)   Personal history of subdural hematoma   AKI (acute kidney injury) (HCC)   GERD (gastroesophageal reflux disease)   C4 cervical fracture (HCC)   Cigarette smoker   Pressure injury of skin   MSSA bacteremia, persistent Patient left AMA during last hospital stay.  Blood culture on admission (5/8) positive for MSSA. 2D echo unremarkable.  Refused TEE initially but now agreeable, although was cleared by neurosurgery for rate regarding his C-spine fracture.  Repeat blood culture on 5/10, negative. --IV nafcillin for total 6 weeks (until 04/26/2020).   --Needs to be treated for full course of antibiotic in the hospital. -Received 50 mg prednisone today.  Taper 10 mg daily until  finished  Acute pulmonary edema - resolved Unclear etiology.  2D echo from 5/8 with normal EF and no valvular abnormality.  Patient refused TEE.  Repeated 2D echo on 6/1 which did not show any new valvular issues, EF 55-60%.   --Monitor strict I's/O.   --As needed IV Lasix.    Minimally displaced C4 fracture and now new displaced fractures at C1-2 Recommendation to wear Aspen collar at all times but patient refusing.   D/w Patsey Berthold(Christine Zdeb, NP). Dr Marcell BarlowYarborough to evaluate the patient today (6/9) Left upper extremity pain and weakness - sounds like neuropathic pain, and progressive weakness of the left arm, unable to raise against gravity.   -CT of the cervical spine along with MRI was performed this morning showing new displaced fractures at C1-2, with lateral displacement of the lateral masses of C1 relative to C2 [name] and a fracture of the tip of the odontoid process. Progressive changes of diskitis and osteomyelitis at C4-5 and T2-3.  --Trial of low dose gabapentin   Cervical and thoracic spine osteomyelitis Seen on MRI of the cervical spine from 5/27 (done with concern for myositis on MRI of the left shoulder).  Showed progressive perivertebral soft tissue swelling with concern for osteomyelitis of C1-C5, T2 and T3 vertebral bodies.  Also has ventral epidural phlegmon extending from C2-C5 and dorsal epidural phlegmon extending from C3-C4-6 6-C7, without discrete epidural abscess, high-grade stenosis or cord signal abnormality.  Also has an acute to subacute mild T3  superior endplate compression fracture. --ID recommends no escalation of antibiotic. will request reeval (6/9) as CT & MRI shows progression of OM   Diabetes mellitus type 2, poorly controlled with hyper and hypoglycemia A1c of 12.6.  Presented with recurrent DKA which has resolved.  Currently on Lantus with Premeal aspart and sliding scale coverage.  Needs close CBG monitoring.  Essential hypertension Stable.   Continue amlodipine and losartan.  Increased losartan to 100 mg as BP uncontrolled.  PRN hydralazine.  Monitor.  Ongoing cocaine use Urine drug screen positive this admission.  He reportedly was getting agitated past 2 days and per sister he was supposed to receive paycheck with which he could buy some drugs.  On as needed clonazepam and Haldol.  Acute kidney injury Prerenal secondary to DKA.  Resolved.  Issues with decision-making capacity Seen by psychiatry multiple times this admission and has capacity.  Cannot be involuntarily committed if he decides to leave AMA.  Depression/Anxiety Continue Klonopin BID, Cymbalta  GERD Continue Pepcid     Patient BMI: Body mass index is 22.24 kg/m.   DVT prophylaxis: Lovenox  Diet:  Diet Orders (From admission, onward)    Start     Ordered   03/15/20 1440  Diet Carb Modified Fluid consistency: Thin; Room service appropriate? Yes with Assist  Diet effective now    Question Answer Comment  Diet-HS Snack? Nothing   Calorie Level Medium 1600-2000   Fluid consistency: Thin   Room service appropriate? Yes with Assist      03/15/20 1439            Code Status: Full Code    Subjective 04/14/20    No new complaints, sugar spiked high (410) this afternoon.  Disposition Plan & Communication   Status is: Inpatient  Remains inpatient appropriate because:IV treatments appropriate due to intensity of illness or inability to take PO   Dispo: The patient is from: Home              Anticipated d/c is to: Home              Anticipated d/c date is: > 3 days (planned final day abx 6/21)              Patient currently is not medically stable to d/c.needs to finish IV Abx while inpt, needs neurosurgery reevaluation   Family Communication: none at bedside, will attempt to call    Consults, Procedures, Significant Events   Consultants:   Neurosurgery  Infectious Disease  Cardiology  Procedures:   MRI Cervical Spine  2D  Echo  Antimicrobials:   IV Nafcillin  5/21 >>    Objective   Vitals:   04/14/20 0418 04/14/20 1018 04/14/20 1021 04/14/20 1122  BP: (!) 151/98 138/90 138/90 (!) 159/97  Pulse: 80  80 92  Resp: 16  17 16   Temp: 97.8 F (36.6 C)  98.6 F (37 C) 97.6 F (36.4 C)  TempSrc: Oral  Oral Oral  SpO2: 99%  99% 99%  Weight:      Height:        Intake/Output Summary (Last 24 hours) at 04/14/2020 1427 Last data filed at 04/14/2020 1256 Gross per 24 hour  Intake 851.49 ml  Output 2000 ml  Net -1148.51 ml   Filed Weights   03/13/20 1654 03/13/20 2055 04/01/20 2230  Weight: 59 kg 60.3 kg 70.3 kg    Physical Exam:  General exam: awake, alert, no acute distress, disheveled appearing Respiratory system: CTAB,  no wheezes, rales or rhonchi, normal respiratory effort. Cardiovascular system: normal S1/S2, RRR, no pedal edema.   Neurologic: left upper extremity weak, unable to keep raised against gravity, intact grip strength.  Other extremities with motor intact.  No spasticity noted. Extremities: pain with passive ROM in left shoulder flexion and abduction, no peripheral edema   Labs   Data Reviewed: I have personally reviewed following labs and imaging studies  CBC: Recent Labs  Lab 04/13/20 0400  WBC 8.4  HGB 10.3*  HCT 32.6*  MCV 95.0  PLT 349   Basic Metabolic Panel: Recent Labs  Lab 04/13/20 0400  NA 142  K 3.7  CL 110  CO2 26  GLUCOSE 213*  BUN 33*  CREATININE 0.79  CALCIUM 8.3*   GFR: Estimated Creatinine Clearance: 96.4 mL/min (by C-G formula based on SCr of 0.79 mg/dL). Liver Function Tests: No results for input(s): AST, ALT, ALKPHOS, BILITOT, PROT, ALBUMIN in the last 168 hours. No results for input(s): LIPASE, AMYLASE in the last 168 hours. No results for input(s): AMMONIA in the last 168 hours. Coagulation Profile: No results for input(s): INR, PROTIME in the last 168 hours. Cardiac Enzymes: No results for input(s): CKTOTAL, CKMB, CKMBINDEX, TROPONINI  in the last 168 hours. BNP (last 3 results) No results for input(s): PROBNP in the last 8760 hours. HbA1C: No results for input(s): HGBA1C in the last 72 hours. CBG: Recent Labs  Lab 04/13/20 1147 04/13/20 1756 04/13/20 2026 04/14/20 0808 04/14/20 1120  GLUCAP 151* 422* 355* 73 201*   Lipid Profile: No results for input(s): CHOL, HDL, LDLCALC, TRIG, CHOLHDL, LDLDIRECT in the last 72 hours. Thyroid Function Tests: No results for input(s): TSH, T4TOTAL, FREET4, T3FREE, THYROIDAB in the last 72 hours. Anemia Panel: No results for input(s): VITAMINB12, FOLATE, FERRITIN, TIBC, IRON, RETICCTPCT in the last 72 hours. Sepsis Labs: No results for input(s): PROCALCITON, LATICACIDVEN in the last 168 hours.  No results found for this or any previous visit (from the past 240 hour(s)).    Imaging Studies   DG Cervical Spine 2 or 3 views  Result Date: 04/13/2020 CLINICAL DATA:  Fracture of C4 vertebra, closed. EXAM: CERVICAL SPINE - 2-3 VIEW COMPARISON:  Cervical MRI 04/01/2020 FINDINGS: Reversal of normal lordosis. Anterior C5 through C7 fusion. Slight decreased density of the anterior vertebral bodies at this level. C4 fracture on prior imaging is not well-defined by radiograph. The dens is obscured. There is symmetric offset of the lateral masses of C1 on C2. diffuse prevertebral soft tissue thickening. Bones are diffusely under mineralized. IMPRESSION: 1. Anterior C5 through C7 fusion with slight decreased density of anterior vertebral bodies at this level. Multilevel osteomyelitis on prior MRI. 2. Known C4 fracture is not well demonstrated by radiograph. 3. Symmetric offset of the lateral masses of C1 on C2, of unknown significance. 4. Prevertebral soft tissue thickening. Electronically Signed   By: Narda Rutherford M.D.   On: 04/13/2020 22:23   CT CERVICAL SPINE WO CONTRAST  Result Date: 04/14/2020 CLINICAL DATA:  Left arm weakness for 1 week. Known C4 fracture and cervical osteomyelitis.  Remote C5-7 ACDF. Recurrent MSSA bacteremia. EXAM: CT CERVICAL SPINE WITHOUT CONTRAST TECHNIQUE: Multidetector CT imaging of the cervical spine was performed without intravenous contrast. Multiplanar CT image reconstructions were also generated. COMPARISON:  Multiple previous studies, including cervical spine CT 03/05/2020 and cervical MRI performed today, 04/01/2020 and 03/17/2020. FINDINGS: Alignment: Mildly progressive kyphosis at C4-5. Skull base and vertebrae: There are new fractures involving the upper cervical spine  with comminuted components involving the right anterior arch of C1. The posterior arch of C1 is fractured on the left. There is a fracture of the tip of the odontoid process. The lateral masses of C1 are laterally displaced relative to C2. The occipital condyles are normally aligned with C1. There is progressive widening and endplate irregularity of the C4-5 disc space worrisome for discitis and osteomyelitis. The right C4-5 facet joint is mildly widened. There is also disc space widening and endplate irregularity at T2-3 which appears new, also worrisome for osteomyelitis. Patient is status post C5-7 ACDF. The hardware is intact at these levels. Soft tissues and spinal canal: Mildly progressive prevertebral soft tissue swelling with ill-defined fluid in the upper cervical spine. Disc levels:  New displaced fractures at C1-2 as described above. C2-3: No significant findings. C3-4: Asymmetric facet hypertrophy on the left contributing to moderate left foraminal narrowing. C4-5: Changes of diskitis and osteomyelitis as described above. Bilateral facet arthropathy, worse on the right with mild widening of the right facet joint. Mild foraminal narrowing bilaterally. The fused C5-6 and C6-7 levels appear stable with posterior osteophytes contributing to chronic foraminal narrowing bilaterally. C7-T1: Grossly stable disc bulging and superior endplate irregularity at T1. Bilateral facet hypertrophy.  Stable mild spinal stenosis and foraminal narrowing bilaterally. T1-2: Bilateral facet hypertrophy with stable mild foraminal narrowing bilaterally. T2-3: New changes of diskitis and osteomyelitis. No apparent spinal stenosis. Upper chest: New small left pleural effusion. Emphysematous changes are present in both lung apices. Other: Bilateral carotid atherosclerosis. IMPRESSION: 1. New displaced fractures at C1-2, with lateral displacement of the lateral masses of C1 relative to C2 [facility] fracture) and a fracture of the tip of the odontoid process. 2. Progressive changes of diskitis and osteomyelitis at C4-5 and T2-3. See separate cervical MR examination report. Consider further evaluation with chest CT. 3. Mildly progressive prevertebral soft tissue swelling with ill-defined fluid in the upper cervical spine, likely related to progressive discitis and osteomyelitis. 4. Stable findings of C5-7 ACDF. Stable chronic foraminal narrowing bilaterally at C5-6 and C6-7. 5. New small left pleural effusion. 6. Aortic Atherosclerosis (ICD10-I70.0) and Emphysema (ICD10-J43.9). 7. Results will be discussed with the provider after completion of the cervical MRI dictation. Electronically Signed   By: Carey Bullocks M.D.   On: 04/14/2020 11:06   MR CERVICAL SPINE W WO CONTRAST  Result Date: 04/14/2020 CLINICAL DATA:  Left arm weakness for 1 week. Known C4 fracture and cervical osteomyelitis. Remote C5-7 ACDF. Recurrent MSSA bacteremia. EXAM: MRI CERVICAL SPINE WITHOUT AND WITH CONTRAST TECHNIQUE: Multiplanar and multiecho pulse sequences of the cervical spine, to include the craniocervical junction and cervicothoracic junction, were obtained without and with intravenous contrast. CONTRAST:  8mL GADAVIST GADOBUTROL 1 MMOL/ML IV SOLN COMPARISON:  Cervical MRI 04/01/2020 and 03/17/2020. CT today and 03/05/2020. FINDINGS: Alignment: The cervical alignment is stable compared with the most recent MRI with reversal of the usual  cervical lordosis. No significant focal angulation or listhesis. Vertebrae: As seen on previous MRI, there is multifocal marrow edema and enhancement involving the bilateral occipital condyles, C1 and C2. On today's CT, there are displaced fractures of the anterior and posterior arches of C1 as well as the tip of the odontoid process. Given the other findings, these fractures may be pathologic. There are progressive changes of diskitis and osteomyelitis at C4-5 and T2-3 compared with previous MRI. There is diffuse marrow edema, endplate irregularity and enhancement at these levels. Endplate changes in the superior endplate of T1 are stable without  definite abnormality of the intervening C7-T1 disc space to confirm osteomyelitis at this level. Stable findings post C5-7 ACDF. Cord: Normal in signal and caliber. No abnormal intradural enhancement. Epidural enhancement anteriorly and posteriorly from C3-4 through C6-7 has not significantly progressed. No focal epidural fluid collection identified. Posterior Fossa, vertebral arteries, paraspinal tissues: The visualized posterior fossa appears unremarkable. Extensive paraspinal inflammatory changes extending from the base of the skull into the upper chest are again noted without focal fluid collection. Disc levels: C1-2: As above, probable pathologic fractures of the anterior and posterior arches of C1 and tip of the odontoid process, better seen on today's CT. Extensive marrow edema and enhancement throughout the C1 and C2 vertebral bodies as well as the clivus and both occipital condyles, suspicious for osteomyelitis. C2-3: Bilateral facet hypertrophy. No significant spinal stenosis or nerve root encroachment. C3-4: Bilateral facet hypertrophy, worse on the left. Mild disc bulging and foraminal narrowing bilaterally. C4-5: As above, progressive changes of diskitis and osteomyelitis with potential involvement of both facet joints. The CSF surrounding the cord is  partially effaced. There is severe foraminal narrowing bilaterally. Stable chronic osseous foraminal narrowing bilaterally at the fused C5-6 and C6-7 levels. C7-T1: Stable endplate degenerative changes anteriorly and bilateral facet hypertrophy contributing to mild foraminal narrowing bilaterally. Changes of diskitis and osteomyelitis at T2-3 are partially imaged. IMPRESSION: 1. Progressive changes of diskitis and osteomyelitis at C4-5 and T2-3 compared with previous MRI. 2. Diffuse marrow edema and enhancement of the occipital condyles, C1 and C2 suspicious for osteomyelitis. There are displaced fractures of the anterior and posterior arches of C1 and tip of the odontoid process, better seen on today's CT and new from previous CT of 03/05/2020. Given the other findings, these fractures may be pathologic. 3. Grossly stable extensive anterior paraspinal inflammatory changes and dural enhancement without focal fluid collection. 4. No cord deformity or abnormal cord signal. The spondylosis and inflammation at C4-5 contribute to spinal stenosis and significant foraminal narrowing bilaterally, potentially contributing to the patient's radicular symptoms. 5. Dr. Zachery Dakins is currently unavailable, but I will attempt to discuss the findings with her later today. Electronically Signed   By: Carey Bullocks M.D.   On: 04/14/2020 11:29     Medications   Scheduled Meds: . amLODipine  10 mg Oral Daily  . aspirin EC  81 mg Oral Daily  . chlorhexidine  15 mL Mouth Rinse BID  . Chlorhexidine Gluconate Cloth  6 each Topical Daily  . clonazePAM  0.5 mg Oral BID  . DULoxetine  30 mg Oral Daily  . enoxaparin (LOVENOX) injection  40 mg Subcutaneous Q24H  . famotidine  20 mg Oral Daily  . feeding supplement (GLUCERNA SHAKE)  237 mL Oral BID BM  . furosemide  40 mg Oral BID  . gabapentin  300 mg Oral BID  . haloperidol  0.25 mg Oral BID  . insulin aspart  0-5 Units Subcutaneous QHS  . insulin aspart  0-9 Units Subcutaneous  TID WC  . insulin aspart  12 Units Subcutaneous TID WC  . insulin glargine  32 Units Subcutaneous Daily  . losartan  100 mg Oral Daily  . mouth rinse  15 mL Mouth Rinse q12n4p  . methocarbamol  500 mg Oral TID  . multivitamin with minerals  1 tablet Oral Daily  . OLANZapine  5 mg Oral QHS  . polyethylene glycol  17 g Oral Daily  . [START ON 04/15/2020] predniSONE  30 mg Oral Q breakfast   Followed by  . [  START ON 04/16/2020] predniSONE  20 mg Oral Q breakfast   Followed by  . [START ON 04/17/2020] predniSONE  10 mg Oral Q breakfast   Continuous Infusions: . sodium chloride 20.8 mL/hr at 04/01/20 0938  . nafcillin (NAFCIL) continuous infusion 20.8 mL/hr at 04/14/20 1256       LOS: 32 days    Time spent: 15 minutes    Sanaia Jasso Sherryll Burger, DO [facility]  04/14/2020, 2:27 PM    If 7PM-7AM, please contact night-coverage. How to contact the [facility] Attending or Consulting provider 7A - 7P or covering provider during after hours 7P -7A, for this patient?    1. Check the care team in [facility] and look for a) attending/consulting [facility] provider listed and b) the [facility] team listed 2. Log into [email or web address] and use [place]'s universal password to access. If you do not have the password, please contact the hospital operator. 3. Locate the [facility] provider you are looking for under [facility] and page to a number that you can be directly reached. 4. If you still have difficulty reaching the provider, please page the [facility] (Director on Call) for the Hospitalists listed on amion for assistance.

## 2020-04-14 NOTE — H&P (View-Only) (Signed)
Referring Physician:  No referring provider defined for this encounter.  Primary Physician:  No primary care provider on file.  Chief Complaint:  L arm weakness x 1 week in setting of known minimally displaced C4 fracture at anterior margin of C4 vertebral body, left of midline, and cervical osteomyelitis, prior C5-7 ACDF (unknown date)  History of Present Illness: Spencer [name] is a 59 y.o. male is well known to our service.   From hospitalist's note dated 04/13/2020 by MD Manuella Ghazi:  "59 year old male with history of uncontrolled type 2 diabetes mellitus, cocaine use, brain aneurysm, hypertension, GERD recently hospitalized on 4/28 for DKA, MSSA bacteremia and AKI but signed out AMA on 5/5 was brought by EMS with hyperglycemia and encephalopathy. In the ED was found to be in DKA with high anion gap of 30. Given aggressive IV hydration and insulin drip with resolution of DKA. Hospital course prolonged due to presence of recurrent MSSA bacteremia.  DKA resolved.. The patient will need to complete 6 weeks of IV nafcillin prior to discharge per ID. Last day of therapy would be 04/26/2020."  The patient is known to neurosurgery as we were consulted on 03/08/2020 due to concerns for C4 vertebral body fracture, it was recommended that he wear an ASPEN collar at all times however per notes he has not been compliant and on exam this morning he is not wearing collar.   He reports that he noticed left arm weakness about one week ago as well as left sided neck pain which radiates down the left arm.    Review of Systems:  A 10 point review of systems is negative, except for the pertinent positives and negatives detailed in the HPI.  Past Medical History: Past Medical History:  Diagnosis Date  . Brain aneurysm   . Broken bones    clavical, ankle, arms toes wriast   . Diabetes mellitus without complication ([place])   . Dysrhythmia   . GERD (gastroesophageal reflux disease)   . Hypertension   . Substance  abuse [facility])     Past Surgical History: Past Surgical History:  Procedure Laterality Date  . CORONARY/GRAFT ACUTE MI REVASCULARIZATION N/A 09/04/2019   Procedure: Coronary/Graft Acute MI Revascularization;  Surgeon: Yolonda Kida, MD;  Location: [facility];  Service: Cardiovascular;  Laterality: N/A;  . ESOPHAGOGASTRODUODENOSCOPY (EGD) WITH PROPOFOL N/A 06/04/2019   Procedure: ESOPHAGOGASTRODUODENOSCOPY (EGD) WITH PROPOFOL;  Surgeon: Toledo, Benay Pike, MD;  Location: [facility];  Service: Gastroenterology;  Laterality: N/A;  . HERNIA REPAIR    . IMPLANTATION / PLACEMENT OF STRIP ELECTRODES VIA BURR HOLES SUBDURAL     anyrusum  . LEFT HEART CATH AND CORONARY ANGIOGRAPHY N/A 09/04/2019   Procedure: LEFT HEART CATH AND CORONARY ANGIOGRAPHY;  Surgeon: Yolonda Kida, MD;  Location: [facility];  Service: Cardiovascular;  Laterality: N/A;  . NECK SURGERY    . TEE WITHOUT CARDIOVERSION N/A 06/03/2019   Procedure: TRANSESOPHAGEAL ECHOCARDIOGRAM (TEE);  Surgeon: Teodoro Spray, MD;  Location: [facility];  Service: Cardiovascular;  Laterality: N/A;    Allergies: Allergies as of 03/13/2020  . (No Known Allergies)    Medications:  Current Facility-Administered Medications:  .  0.9 %  sodium chloride infusion, , Intravenous, PRN, Ralene Muskrat B, MD, Last Rate: 20.8 mL/hr at 04/01/20 0938, Rate Change at 04/01/20 0938 .  acetaminophen (TYLENOL) tablet 650 mg, 650 mg, Oral, Q6H PRN, Ralene Muskrat B, MD, 650 mg at 04/07/20 1229 .  amLODipine (NORVASC) tablet 10 mg, 10  mg, Oral, Daily, Rawla, Prashanth, MD, 10 mg at 04/13/20 0915 .  aspirin EC tablet 81 mg, 81 mg, Oral, Daily, Rawla, Prashanth, MD, 81 mg at 04/13/20 0912 .  bisacodyl (DULCOLAX) EC tablet 5 mg, 5 mg, Oral, Daily PRN, Gillis Santa, MD .  bisacodyl (DULCOLAX) suppository 10 mg, 10 mg, Rectal, Daily PRN, Gillis Santa, MD .  chlorhexidine (PERIDEX) 0.12 % solution 15 mL, 15 mL, Mouth Rinse, BID,  Ouma, Hubbard Hartshorn, NP, 15 mL at 04/13/20 2221 .  Chlorhexidine Gluconate Cloth 2 % PADS 6 each, 6 each, Topical, Daily, Jimmye Norman, NP, 6 each at 04/13/20 [phone] .  clonazePAM (KLONOPIN) disintegrating tablet 0.5 mg, 0.5 mg, Oral, BID, Swayze, Ava, DO, 0.5 mg at 04/13/20 1945 .  dextrose 50 % solution 0-50 mL, 0-50 mL, Intravenous, PRN, Rawla, Prashanth, MD .  DULoxetine (CYMBALTA) DR capsule 30 mg, 30 mg, Oral, Daily, Roselind Messier, MD, 30 mg at 04/13/20 0914 .  enoxaparin (LOVENOX) injection 40 mg, 40 mg, Subcutaneous, Q24H, Rawla, Prashanth, MD, 40 mg at 04/13/20 2221 .  famotidine (PEPCID) tablet 20 mg, 20 mg, Oral, Daily, Sreenath, Sudheer B, MD, 20 mg at 04/13/20 2222 .  feeding supplement (GLUCERNA SHAKE) (GLUCERNA SHAKE) liquid 237 mL, 237 mL, Oral, BID BM, Charise Killian, MD, 237 mL at 04/13/20 1216 .  furosemide (LASIX) tablet 40 mg, 40 mg, Oral, BID, Dhungel, Nishant, MD, 40 mg at 04/13/20 1806 .  gabapentin (NEURONTIN) tablet 300 mg, 300 mg, Oral, BID, Esaw Grandchild A, DO, 300 mg at 04/13/20 2221 .  haloperidol (HALDOL) tablet 0.25 mg, 0.25 mg, Oral, BID, Roselind Messier, MD, 0.25 mg at 04/13/20 2222 .  haloperidol (HALDOL) tablet 2 mg, 2 mg, Oral, Q6H PRN, Gillis Santa, MD, 2 mg at 04/02/20 1646 .  hydrALAZINE (APRESOLINE) injection 10 mg, 10 mg, Intravenous, Q6H PRN, Charise Killian, MD, 10 mg at 04/06/20 0412 .  HYDROcodone-acetaminophen (NORCO/VICODIN) 5-325 MG per tablet 1 tablet, 1 tablet, Oral, Q6H PRN, Pennie Banter, DO, 1 tablet at 04/14/20 0412 .  insulin aspart (novoLOG) injection 0-5 Units, 0-5 Units, Subcutaneous, QHS, Sreenath, Sudheer B, MD, 5 Units at 04/13/20 2221 .  insulin aspart (novoLOG) injection 0-9 Units, 0-9 Units, Subcutaneous, TID WC, Pennie Banter, DO, 8 Units at 04/13/20 1806 .  insulin aspart (novoLOG) injection 12 Units, 12 Units, Subcutaneous, TID WC, Lowella Bandy, RPH, 12 Units at 04/13/20 1808 .  insulin  glargine (LANTUS) injection 32 Units, 32 Units, Subcutaneous, Daily, Swayze, Ava, DO, 32 Units at 04/13/20 0850 .  ipratropium-albuterol (DUONEB) 0.5-2.5 (3) MG/3ML nebulizer solution 3 mL, 3 mL, Nebulization, Q6H PRN, Esaw Grandchild A, DO .  losartan (COZAAR) tablet 100 mg, 100 mg, Oral, Daily, Esaw Grandchild A, DO, 100 mg at 04/13/20 0912 .  MEDLINE mouth rinse, 15 mL, Mouth Rinse, q12n4p, Ouma, Hubbard Hartshorn, NP, 15 mL at 04/13/20 1500 .  methocarbamol (ROBAXIN) tablet 500 mg, 500 mg, Oral, TID, Georgeann Oppenheim, Sudheer B, MD, 500 mg at 04/13/20 2222 .  multivitamin with minerals tablet 1 tablet, 1 tablet, Oral, Daily, Georgeann Oppenheim, Sudheer B, MD, 1 tablet at 04/13/20 0912 .  nafcillin 12 g in sodium chloride 0.9 % 500 mL continuous infusion, 12 g, Intravenous, Q24H, Charise Killian, MD, Last Rate: 20.8 mL/hr at 04/14/20 0004, Rate Verify at 04/14/20 0004 .  OLANZapine (ZYPREXA) tablet 5 mg, 5 mg, Oral, QHS, Roselind Messier, MD, 5 mg at 04/13/20 2222 .  polyethylene glycol (MIRALAX / GLYCOLAX) packet 17 g, 17  g, Oral, Daily, Gillis Santa, MD, 17 g at 04/13/20 0915 .  [COMPLETED] predniSONE (DELTASONE) tablet 50 mg, 50 mg, Oral, Q breakfast, 50 mg at 04/13/20 0913 **FOLLOWED BY** predniSONE (DELTASONE) tablet 40 mg, 40 mg, Oral, Q breakfast **FOLLOWED BY** [START ON 04/15/2020] predniSONE (DELTASONE) tablet 30 mg, 30 mg, Oral, Q breakfast **FOLLOWED BY** [START ON 04/16/2020] predniSONE (DELTASONE) tablet 20 mg, 20 mg, Oral, Q breakfast **FOLLOWED BY** [START ON 04/17/2020] predniSONE (DELTASONE) tablet 10 mg, 10 mg, Oral, Q breakfast, Sherryll Burger, Vipul, MD .  sodium chloride flush (NS) 0.9 % injection 10-40 mL, 10-40 mL, Intracatheter, PRN, Charise Killian, MD .  traMADol Janean Sark) tablet 50 mg, 50 mg, Oral, Q6H PRN, Charise Killian, MD, 50 mg at 04/13/20 1949   Social History: Social History   Tobacco Use  . Smoking status: Current Some Day Smoker  . Smokeless tobacco: Never Used  Substance  Use Topics  . Alcohol use: Yes    Alcohol/week: 1.0 standard drinks    Types: 1 Cans of beer per week  . Drug use: Not Currently    Types: Cocaine    Family Medical History: Family History  Problem Relation Age of Onset  . CAD Sister   . Hypertension Sister   . Healthy Mother   . Healthy Father     Physical Examination: Vitals:   04/13/20 2020 04/14/20 0418  BP: 139/81 (!) 151/98  Pulse: 85 80  Resp: 16 16  Temp: 97.9 F (36.6 C) 97.8 F (36.6 C)  SpO2: 98% 99%     General: Patient is well developed, well nourished, calm, collected, and in no apparent distress.  Psychiatric: Patient is non-anxious.  Head:  Pupils equal, round, and reactive to light.  ENT:  Oral mucosa appears well hydrated.  Neck:   Deferred due to known fracture  Respiratory: Patient is breathing without any difficulty.  Extremities: No edema.  Vascular: Palpable pulses in dorsal pedal vessels.  Skin:   On exposed skin, there are no abnormal skin lesions.  NEUROLOGICAL:  General: In no acute distress.   Awake, alert, oriented to person, place, and time.  Pupils equal round and reactive to light.  Facial tone is symmetric.  Tongue protrusion is midline.  There is no pronator drift.  ROM of spine: deferred due to known fracture.  Palpation of spine: ttp with light touch over lower cervical spine and left shoulder  Strength: Side Biceps Triceps Deltoid Interossei Grip Wrist Ext. Wrist Flex.  R 5 5 5 5 5 5 5   L 4- 5 3 5 5 5 5    Side Iliopsoas Quads Hamstring PF DF EHL  R 5 5 5 5 5 5   L 5 5 5 5 5 5    Reflexes are 1+ and symmetric at the biceps, brachioradialis, patella and achilles.   Bilateral upper and lower extremity sensation is intact to light touch. Clonus is not present.  Hoffman's is absent.  Patient denies saddle anesthesia or bowel and bladder dysfunction, voiding freely.     Imaging: MRI from 04/01/2020: IMPRESSION: 1. Progressive prevertebral soft tissue swelling  extending from the base of the clivus to C6 concerning for infection. Abnormal marrow signal concerning for osteomyelitis of the C1, C2, C4, C5, T2, and T3 vertebral bodies. 2. Ventral epidural phlegmon extending from C2-C4 to C5. Dorsal epidural phlegmon extending from C3-C4 through C6-C7. No discrete epidural abscess. No high grade spinal canal stenosis or cord signal abnormality. 3. Acute to subacute mild T3 superior endplate compression  fracture. Small C4 vertebral body fracture is better evaluated on prior CT. 4. Prior C5-C7 ACDF. Residual moderate to severe bilateral neuroforaminal stenosis at these levels.  Assessment and Plan: Mr. Nesheiwat is a pleasant 59 y.o. male with left sided deltoid and biceps weakness in setting of prior known C4 vertebral body fracture. Prior exams by neurosurgery showed pt to have full strength.  Given new weakness to L deltoid and biceps, will obtain CT of cervical spine and MRI of cervical spine w/wout contrast.   Please ensure patient compliant with C Collar.  Further recommendations will be given once imaging reviewed.      Patsey Berthold, NP Dept. of Neurosurgery

## 2020-04-14 NOTE — Consult Note (Signed)
Referring Physician:  No referring provider defined for this encounter.  Primary Physician:  No primary care provider on file.  Chief Complaint:  L arm weakness x 1 week in setting of known minimally displaced C4 fracture at anterior margin of C4 vertebral body, left of midline, and cervical osteomyelitis, prior C5-7 ACDF (unknown date)  History of Present Illness: Spencer [name] is a 59 y.o. male is well known to our service.   From hospitalist's note dated 04/13/2020 by MD Manuella Ghazi:  "59 year old male with history of uncontrolled type 2 diabetes mellitus, cocaine use, brain aneurysm, hypertension, GERD recently hospitalized on 4/28 for DKA, MSSA bacteremia and AKI but signed out AMA on 5/5 was brought by EMS with hyperglycemia and encephalopathy. In the ED was found to be in DKA with high anion gap of 30. Given aggressive IV hydration and insulin drip with resolution of DKA. Hospital course prolonged due to presence of recurrent MSSA bacteremia.  DKA resolved.. The patient will need to complete 6 weeks of IV nafcillin prior to discharge per ID. Last day of therapy would be 04/26/2020."  The patient is known to neurosurgery as we were consulted on 03/08/2020 due to concerns for C4 vertebral body fracture, it was recommended that he wear an ASPEN collar at all times however per notes he has not been compliant and on exam this morning he is not wearing collar.   He reports that he noticed left arm weakness about one week ago as well as left sided neck pain which radiates down the left arm.    Review of Systems:  A 10 point review of systems is negative, except for the pertinent positives and negatives detailed in the HPI.  Past Medical History: Past Medical History:  Diagnosis Date  . Brain aneurysm   . Broken bones    clavical, ankle, arms toes wriast   . Diabetes mellitus without complication ([place])   . Dysrhythmia   . GERD (gastroesophageal reflux disease)   . Hypertension   . Substance  abuse [facility])     Past Surgical History: Past Surgical History:  Procedure Laterality Date  . CORONARY/GRAFT ACUTE MI REVASCULARIZATION N/A 09/04/2019   Procedure: Coronary/Graft Acute MI Revascularization;  Surgeon: Yolonda Kida, MD;  Location: [facility];  Service: Cardiovascular;  Laterality: N/A;  . ESOPHAGOGASTRODUODENOSCOPY (EGD) WITH PROPOFOL N/A 06/04/2019   Procedure: ESOPHAGOGASTRODUODENOSCOPY (EGD) WITH PROPOFOL;  Surgeon: Toledo, Benay Pike, MD;  Location: [facility];  Service: Gastroenterology;  Laterality: N/A;  . HERNIA REPAIR    . IMPLANTATION / PLACEMENT OF STRIP ELECTRODES VIA BURR HOLES SUBDURAL     anyrusum  . LEFT HEART CATH AND CORONARY ANGIOGRAPHY N/A 09/04/2019   Procedure: LEFT HEART CATH AND CORONARY ANGIOGRAPHY;  Surgeon: Yolonda Kida, MD;  Location: [facility];  Service: Cardiovascular;  Laterality: N/A;  . NECK SURGERY    . TEE WITHOUT CARDIOVERSION N/A 06/03/2019   Procedure: TRANSESOPHAGEAL ECHOCARDIOGRAM (TEE);  Surgeon: Teodoro Spray, MD;  Location: [facility];  Service: Cardiovascular;  Laterality: N/A;    Allergies: Allergies as of 03/13/2020  . (No Known Allergies)    Medications:  Current Facility-Administered Medications:  .  0.9 %  sodium chloride infusion, , Intravenous, PRN, Ralene Muskrat B, MD, Last Rate: 20.8 mL/hr at 04/01/20 0938, Rate Change at 04/01/20 0938 .  acetaminophen (TYLENOL) tablet 650 mg, 650 mg, Oral, Q6H PRN, Ralene Muskrat B, MD, 650 mg at 04/07/20 1229 .  amLODipine (NORVASC) tablet 10 mg, 10  mg, Oral, Daily, Rawla, Prashanth, MD, 10 mg at 04/13/20 0915 .  aspirin EC tablet 81 mg, 81 mg, Oral, Daily, Rawla, Prashanth, MD, 81 mg at 04/13/20 0912 .  bisacodyl (DULCOLAX) EC tablet 5 mg, 5 mg, Oral, Daily PRN, Kumar, Dileep, MD .  bisacodyl (DULCOLAX) suppository 10 mg, 10 mg, Rectal, Daily PRN, Kumar, Dileep, MD .  chlorhexidine (PERIDEX) 0.12 % solution 15 mL, 15 mL, Mouth Rinse, BID,  Ouma, Elizabeth Achieng, NP, 15 mL at 04/13/20 2221 .  Chlorhexidine Gluconate Cloth 2 % PADS 6 each, 6 each, Topical, Daily, Ouma, Elizabeth Achieng, NP, 6 each at 04/13/20 0925 .  clonazePAM (KLONOPIN) disintegrating tablet 0.5 mg, 0.5 mg, Oral, BID, Swayze, Ava, DO, 0.5 mg at 04/13/20 1945 .  dextrose 50 % solution 0-50 mL, 0-50 mL, Intravenous, PRN, Rawla, Prashanth, MD .  DULoxetine (CYMBALTA) DR capsule 30 mg, 30 mg, Oral, Daily, Rao, Ramakrishna, MD, 30 mg at 04/13/20 0914 .  enoxaparin (LOVENOX) injection 40 mg, 40 mg, Subcutaneous, Q24H, Rawla, Prashanth, MD, 40 mg at 04/13/20 2221 .  famotidine (PEPCID) tablet 20 mg, 20 mg, Oral, Daily, Sreenath, Sudheer B, MD, 20 mg at 04/13/20 2222 .  feeding supplement (GLUCERNA SHAKE) (GLUCERNA SHAKE) liquid 237 mL, 237 mL, Oral, BID BM, Williams, Jamiese M, MD, 237 mL at 04/13/20 1216 .  furosemide (LASIX) tablet 40 mg, 40 mg, Oral, BID, Dhungel, Nishant, MD, 40 mg at 04/13/20 1806 .  gabapentin (NEURONTIN) tablet 300 mg, 300 mg, Oral, BID, Griffith, Kelly A, DO, 300 mg at 04/13/20 2221 .  haloperidol (HALDOL) tablet 0.25 mg, 0.25 mg, Oral, BID, Rao, Ramakrishna, MD, 0.25 mg at 04/13/20 2222 .  haloperidol (HALDOL) tablet 2 mg, 2 mg, Oral, Q6H PRN, Kumar, Dileep, MD, 2 mg at 04/02/20 1646 .  hydrALAZINE (APRESOLINE) injection 10 mg, 10 mg, Intravenous, Q6H PRN, Williams, Jamiese M, MD, 10 mg at 04/06/20 0412 .  HYDROcodone-acetaminophen (NORCO/VICODIN) 5-325 MG per tablet 1 tablet, 1 tablet, Oral, Q6H PRN, Griffith, Kelly A, DO, 1 tablet at 04/14/20 0412 .  insulin aspart (novoLOG) injection 0-5 Units, 0-5 Units, Subcutaneous, QHS, Sreenath, Sudheer B, MD, 5 Units at 04/13/20 2221 .  insulin aspart (novoLOG) injection 0-9 Units, 0-9 Units, Subcutaneous, TID WC, Griffith, Kelly A, DO, 8 Units at 04/13/20 1806 .  insulin aspart (novoLOG) injection 12 Units, 12 Units, Subcutaneous, TID WC, Grubb, Rodney D, RPH, 12 Units at 04/13/20 1808 .  insulin  glargine (LANTUS) injection 32 Units, 32 Units, Subcutaneous, Daily, Swayze, Ava, DO, 32 Units at 04/13/20 0850 .  ipratropium-albuterol (DUONEB) 0.5-2.5 (3) MG/3ML nebulizer solution 3 mL, 3 mL, Nebulization, Q6H PRN, Griffith, Kelly A, DO .  losartan (COZAAR) tablet 100 mg, 100 mg, Oral, Daily, Griffith, Kelly A, DO, 100 mg at 04/13/20 0912 .  MEDLINE mouth rinse, 15 mL, Mouth Rinse, q12n4p, Ouma, Elizabeth Achieng, NP, 15 mL at 04/13/20 1500 .  methocarbamol (ROBAXIN) tablet 500 mg, 500 mg, Oral, TID, Sreenath, Sudheer B, MD, 500 mg at 04/13/20 2222 .  multivitamin with minerals tablet 1 tablet, 1 tablet, Oral, Daily, Sreenath, Sudheer B, MD, 1 tablet at 04/13/20 0912 .  nafcillin 12 g in sodium chloride 0.9 % 500 mL continuous infusion, 12 g, Intravenous, Q24H, Williams, Jamiese M, MD, Last Rate: 20.8 mL/hr at 04/14/20 0004, Rate Verify at 04/14/20 0004 .  OLANZapine (ZYPREXA) tablet 5 mg, 5 mg, Oral, QHS, Rao, Ramakrishna, MD, 5 mg at 04/13/20 2222 .  polyethylene glycol (MIRALAX / GLYCOLAX) packet 17 g, 17   g, Oral, Daily, Gillis Santa, MD, 17 g at 04/13/20 0915 .  [COMPLETED] predniSONE (DELTASONE) tablet 50 mg, 50 mg, Oral, Q breakfast, 50 mg at 04/13/20 0913 **FOLLOWED BY** predniSONE (DELTASONE) tablet 40 mg, 40 mg, Oral, Q breakfast **FOLLOWED BY** [START ON 04/15/2020] predniSONE (DELTASONE) tablet 30 mg, 30 mg, Oral, Q breakfast **FOLLOWED BY** [START ON 04/16/2020] predniSONE (DELTASONE) tablet 20 mg, 20 mg, Oral, Q breakfast **FOLLOWED BY** [START ON 04/17/2020] predniSONE (DELTASONE) tablet 10 mg, 10 mg, Oral, Q breakfast, Sherryll Burger, Vipul, MD .  sodium chloride flush (NS) 0.9 % injection 10-40 mL, 10-40 mL, Intracatheter, PRN, Charise Killian, MD .  traMADol Janean Sark) tablet 50 mg, 50 mg, Oral, Q6H PRN, Charise Killian, MD, 50 mg at 04/13/20 1949   Social History: Social History   Tobacco Use  . Smoking status: Current Some Day Smoker  . Smokeless tobacco: Never Used  Substance  Use Topics  . Alcohol use: Yes    Alcohol/week: 1.0 standard drinks    Types: 1 Cans of beer per week  . Drug use: Not Currently    Types: Cocaine    Family Medical History: Family History  Problem Relation Age of Onset  . CAD Sister   . Hypertension Sister   . Healthy Mother   . Healthy Father     Physical Examination: Vitals:   04/13/20 2020 04/14/20 0418  BP: 139/81 (!) 151/98  Pulse: 85 80  Resp: 16 16  Temp: 97.9 F (36.6 C) 97.8 F (36.6 C)  SpO2: 98% 99%     General: Patient is well developed, well nourished, calm, collected, and in no apparent distress.  Psychiatric: Patient is non-anxious.  Head:  Pupils equal, round, and reactive to light.  ENT:  Oral mucosa appears well hydrated.  Neck:   Deferred due to known fracture  Respiratory: Patient is breathing without any difficulty.  Extremities: No edema.  Vascular: Palpable pulses in dorsal pedal vessels.  Skin:   On exposed skin, there are no abnormal skin lesions.  NEUROLOGICAL:  General: In no acute distress.   Awake, alert, oriented to person, place, and time.  Pupils equal round and reactive to light.  Facial tone is symmetric.  Tongue protrusion is midline.  There is no pronator drift.  ROM of spine: deferred due to known fracture.  Palpation of spine: ttp with light touch over lower cervical spine and left shoulder  Strength: Side Biceps Triceps Deltoid Interossei Grip Wrist Ext. Wrist Flex.  R 5 5 5 5 5 5 5   L 4- 5 3 5 5 5 5    Side Iliopsoas Quads Hamstring PF DF EHL  R 5 5 5 5 5 5   L 5 5 5 5 5 5    Reflexes are 1+ and symmetric at the biceps, brachioradialis, patella and achilles.   Bilateral upper and lower extremity sensation is intact to light touch. Clonus is not present.  Hoffman's is absent.  Patient denies saddle anesthesia or bowel and bladder dysfunction, voiding freely.     Imaging: MRI from 04/01/2020: IMPRESSION: 1. Progressive prevertebral soft tissue swelling  extending from the base of the clivus to C6 concerning for infection. Abnormal marrow signal concerning for osteomyelitis of the C1, C2, C4, C5, T2, and T3 vertebral bodies. 2. Ventral epidural phlegmon extending from C2-C4 to C5. Dorsal epidural phlegmon extending from C3-C4 through C6-C7. No discrete epidural abscess. No high grade spinal canal stenosis or cord signal abnormality. 3. Acute to subacute mild T3 superior endplate compression  fracture. Small C4 vertebral body fracture is better evaluated on prior CT. 4. Prior C5-C7 ACDF. Residual moderate to severe bilateral neuroforaminal stenosis at these levels.  Assessment and Plan: Spencer [name] is a pleasant 59 y.o. male with left sided deltoid and biceps weakness in setting of prior known C4 vertebral body fracture. Prior exams by neurosurgery showed pt to have full strength.  Given new weakness to L deltoid and biceps, will obtain CT of cervical spine and MRI of cervical spine w/wout contrast.   Please ensure patient compliant with C Collar.  Further recommendations will be given once imaging reviewed.      Patsey Berthold, NP Dept. of Neurosurgery

## 2020-04-15 LAB — BLOOD CULTURE ID PANEL (REFLEXED)

## 2020-04-15 LAB — C-REACTIVE PROTEIN: CRP: 1.3 mg/dL — ABNORMAL HIGH (ref <1.0)

## 2020-04-15 LAB — GLUCOSE, CAPILLARY
Glucose-Capillary: 268 mg/dL — ABNORMAL HIGH (ref 70–99)
Glucose-Capillary: 217 mg/dL — ABNORMAL HIGH (ref 70–99)
Glucose-Capillary: 229 mg/dL — ABNORMAL HIGH (ref 70–99)
Glucose-Capillary: 177 mg/dL — ABNORMAL HIGH (ref 70–99)

## 2020-04-15 LAB — TYPE AND SCREEN
ABO/RH(D): O POS
Antibody Screen: NEGATIVE

## 2020-04-15 MED ORDER — CHLORHEXIDINE GLUCONATE CLOTH 2 % EX PADS
6.0000 | MEDICATED_PAD | Freq: Every day | CUTANEOUS | Status: DC
  Administered 2020-04-16 – 2020-04-27 (×7): 6 via TOPICAL

## 2020-04-15 MED ORDER — RIFAMPIN 300 MG PO CAPS
300.0000 mg | ORAL_CAPSULE | Freq: Two times a day (BID) | ORAL | Status: DC
  Filled 2020-04-15: qty 1

## 2020-04-15 MED ORDER — RIFAMPIN 300 MG PO CAPS
300.0000 mg | ORAL_CAPSULE | Freq: Two times a day (BID) | ORAL | Status: AC
  Administered 2020-04-15 – 2020-04-22 (×15): 300 mg via ORAL
  Filled 2020-04-15 (×15): qty 1

## 2020-04-15 NOTE — Progress Notes (Signed)
After discussion with Dr. Rivka Safer, we have recommended washout and removal of plate.  I have reviewed the plan with Spencer [name].  I discussed the planned procedure at length with the patient, including the risks, benefits, alternatives, and indications. The risks discussed include but are not limited to bleeding, infection, need for reoperation, spinal fluid leak, stroke, vision loss, anesthetic complication, coma, paralysis, and even death. I also described in detail that improvement was not guaranteed.  The patient expressed understanding of these risks, and asked that we proceed with surgery. I described the surgery in layman's terms, and gave ample opportunity for questions, which were answered to the best of my ability.

## 2020-04-15 NOTE — Progress Notes (Signed)
PHARMACY - PHYSICIAN COMMUNICATION CRITICAL VALUE ALERT - BLOOD CULTURE IDENTIFICATION (BCID)  Spencer [name] is an 59 y.o. 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  Most likely this is a contaminant with COAN.  (include suspected source if known)  Name of physician (or Provider) ContactedCliffton Asters , NP   Current antibiotics: Nafcillin 12 gm IV Q24H   Changes to prescribed antibiotics recommended:  Patient is on recommended antibiotics - No changes needed  Results for orders placed or performed during the hospital encounter of 03/13/20  Blood Culture ID Panel (Reflexed) (Collected: 04/14/2020  9:23 PM)  Result Value Ref Range   Enterococcus species NOT DETECTED NOT DETECTED   Listeria monocytogenes NOT DETECTED NOT DETECTED   Staphylococcus species DETECTED (A) NOT DETECTED   Staphylococcus aureus (BCID) NOT DETECTED NOT DETECTED   Methicillin resistance DETECTED (A) NOT DETECTED   Streptococcus species NOT DETECTED NOT DETECTED   Streptococcus agalactiae NOT DETECTED NOT DETECTED   Streptococcus pneumoniae NOT DETECTED NOT DETECTED   Streptococcus pyogenes NOT DETECTED NOT DETECTED   Acinetobacter baumannii NOT DETECTED NOT DETECTED   Enterobacteriaceae species NOT DETECTED NOT DETECTED   Enterobacter cloacae complex NOT DETECTED NOT DETECTED   Escherichia coli NOT DETECTED NOT DETECTED   Klebsiella oxytoca NOT DETECTED NOT DETECTED   Klebsiella pneumoniae NOT DETECTED NOT DETECTED   Proteus species NOT DETECTED NOT DETECTED   Serratia marcescens NOT DETECTED NOT DETECTED   Haemophilus influenzae NOT DETECTED NOT DETECTED   Neisseria meningitidis NOT DETECTED NOT DETECTED   Pseudomonas aeruginosa NOT DETECTED NOT DETECTED   Candida albicans NOT DETECTED NOT DETECTED   Candida glabrata NOT DETECTED NOT DETECTED   Candida krusei NOT DETECTED NOT  DETECTED   Candida parapsilosis NOT DETECTED NOT DETECTED   Candida tropicalis NOT DETECTED NOT DETECTED    Alveda Vanhorne D 04/15/2020  8:19 PM

## 2020-04-15 NOTE — Progress Notes (Addendum)
Daily Progress Note   Patient Name: Spencer [name]       Date: 04/15/2020 DOB: May 26, 1961  Age: 59 y.o. MRN#: [id number] Attending Physician: Max Sane, MD Primary Care Physician: No primary care provider on file. Admit Date: 03/13/2020  Reason for Consultation/Follow-up: Establishing goals of care  Subjective: Patient is resting in bed, no family at bedside. He is wearing c collar today. He understands the purpose of the collar and states there are discussions of surgery. He states a meeting has occurred with his family members and the physician team. He tells me he wants whatever care is needed. He tells me he has considered the new medical information since our last meeting, and feels the same about his care as he did during the initial conversation; if he required ventilator support either due to decline or post op, he would want the ventilator " for a couple of days".   Length of Stay: 33  Current Medications: Scheduled Meds:  . amLODipine  10 mg Oral Daily  . aspirin EC  81 mg Oral Daily  . chlorhexidine  15 mL Mouth Rinse BID  . clonazePAM  0.5 mg Oral BID  . DULoxetine  30 mg Oral Daily  . enoxaparin (LOVENOX) injection  40 mg Subcutaneous Q24H  . famotidine  20 mg Oral Daily  . feeding supplement (GLUCERNA SHAKE)  237 mL Oral BID BM  . furosemide  40 mg Oral BID  . gabapentin  300 mg Oral BID  . haloperidol  0.25 mg Oral BID  . insulin aspart  0-5 Units Subcutaneous QHS  . insulin aspart  0-9 Units Subcutaneous TID WC  . insulin aspart  12 Units Subcutaneous TID WC  . insulin glargine  32 Units Subcutaneous Daily  . losartan  100 mg Oral Daily  . mouth rinse  15 mL Mouth Rinse q12n4p  . methocarbamol  500 mg Oral TID  . multivitamin with minerals  1 tablet Oral Daily  .  OLANZapine  5 mg Oral QHS  . polyethylene glycol  17 g Oral Daily  . [START ON 04/16/2020] predniSONE  20 mg Oral Q breakfast   Followed by  . [START ON 04/17/2020] predniSONE  10 mg Oral Q breakfast    Continuous Infusions: . sodium chloride 20.8 mL/hr at 04/01/20 0938  . nafcillin (  NAFCIL) continuous infusion 20.8 mL/hr at 04/14/20 1256    PRN Meds: sodium chloride, acetaminophen, bisacodyl, bisacodyl, dextrose, haloperidol, hydrALAZINE, HYDROcodone-acetaminophen, ipratropium-albuterol, sodium chloride flush, traMADol  Physical Exam Pulmonary:     Effort: Pulmonary effort is normal.  Neurological:     Mental Status: He is alert.             Vital Signs: BP 140/85 (BP Location: Left Arm)   Pulse 88   Temp 97.6 F (36.4 C) (Oral)   Resp 18   Ht 5\' 10"  (1.778 m)   Wt 70.3 kg   SpO2 99%   BMI 22.24 kg/m  SpO2: SpO2: 99 % O2 Device: O2 Device: Room Air O2 Flow Rate: O2 Flow Rate (L/min): 2 L/min  Intake/output summary:   Intake/Output Summary (Last 24 hours) at 04/15/2020 1142 Last data filed at 04/15/2020 1012 Gross per 24 hour  Intake 223.93 ml  Output 3100 ml  Net -2876.07 ml   LBM: Last BM Date: 04/14/20 Baseline Weight: Weight: 59 kg Most recent weight: Weight: 70.3 kg       Palliative Assessment/Data: 30%      Patient Active Problem List   Diagnosis Date Noted  . MSSA bacteremia 03/14/2020  . Cigarette smoker 03/14/2020  . Pressure injury of skin 03/14/2020  . C4 cervical fracture (HCC) 03/05/2020  . Atrial fibrillation with RVR (HCC) 08/07/2019  . GERD (gastroesophageal reflux disease) 08/07/2019  . AKI (acute kidney injury) (HCC) 02/19/2019  . Diabetic neuropathy (HCC) 01/16/2017  . DM2 (diabetes mellitus, type 2) (HCC) 01/16/2017  . History of cocaine abuse (HCC) 01/16/2017  . Personal history of subdural hematoma 01/16/2017  . Protein-calorie malnutrition, severe 11/08/2016  . DKA (diabetic ketoacidoses) (HCC) 04/03/2015  . Hypertension  04/03/2015  . Depression 04/03/2015  . Opiate dependence (HCC) 04/03/2015  . Type II or unspecified type diabetes mellitus without mention of complication, not stated as uncontrolled 07/19/2014  . Hernia of flank 09/20/2012    Palliative Care Assessment & Plan     Recommendations/Plan:  Full code/full scope.     Code Status:    Code Status Orders  (From admission, onward)         Start     Ordered   03/13/20 1847  Full code  Continuous        03/13/20 1853        Code Status History    Date Active Date Inactive Code Status Order ID Comments User Context   03/03/2020 1619 03/10/2020 0710 Full Code 05/10/2020  [id number], MD ED   02/13/2020 2218 02/17/2020 2157 DNR 2158  [id number], NP Inpatient   02/13/2020 1736 02/13/2020 2218 Full Code 04/14/2020  [id number], MD ED   12/15/2019 1214 12/17/2019 1852 Full Code 02/14/2020  [id number], MD ED   11/26/2019 0048 11/28/2019 1901 Full Code 11/30/2019  Mansy, [id number], MD ED   10/24/2019 1530 10/26/2019 1841 Full Code 10/28/2019  [id number], MD ED   09/12/2019 2047 09/15/2019 1648 Full Code 13/07/2019  Mansy, [id number], MD ED   09/04/2019 1801 09/10/2019 1648 Full Code 13/02/2019  [id number], MD Inpatient   09/04/2019 1748 09/04/2019 1801 Full Code 09/06/2019  [id number], MD Inpatient   08/19/2019 0551 08/21/2019 1813 Full Code 08/23/2019  Mansy, [id number], MD ED   08/07/2019 0221 08/08/2019 1802 Full Code 10/08/2019  [id number], MD ED   07/27/2019 1922 07/29/2019 2245 Full Code 07/31/2019  [id number], MD ED   06/09/2019  2125 06/12/2019 2007 Full Code [id number]  Pearletha Alfred, NP ED   05/28/2019 1845 06/04/2019 2148 Full Code [id number]  Katha Hamming, MD ED   02/19/2019 0122 02/19/2019 1901 Full Code [id number]  Oralia Manis, MD Inpatient   02/08/2019 0130 02/13/2019 1546 Full Code [id number]  Arnaldo Natal, MD ED   01/17/2019 0412 01/18/2019 1903 Full Code [id number]  Mansy, Vernetta Honey, MD ED   01/06/2019 1729 01/08/2019 1427 Full  Code [id number]  Alford Highland, MD ED   10/25/2018 1356 10/27/2018 1345 Full Code [id number]  Auburn Bilberry, MD Inpatient   10/23/2018 1828 10/24/2018 1759 Full Code [id number]  Auburn Bilberry, MD Inpatient   01/15/2017 1417 01/18/2017 1716 Full Code [id number]  Enid Baas, MD Inpatient   11/06/2016 0319 11/09/2016 1508 Full Code [id number]  Marton Redwood Mordecai Rasmussen, MD Inpatient   11/06/2016 0229 11/06/2016 0319 Full Code [id number]  de Casandra Doffing, MD Inpatient   04/04/2015 0008 04/06/2015 1418 Full Code [id number]  Haydee Monica, MD Inpatient   Advance Care Planning Activity       Prognosis:  Poor overall  Thank you for allowing the Palliative Medicine Team to assist in the care of this patient.   Total Time 25 min Prolonged Time Billed no       Greater than 50%  of this time was spent counseling and coordinating care related to the above assessment and plan.  Morton Stall, NP  Please contact Palliative Medicine Team phone at [phone] for questions and concerns.

## 2020-04-15 NOTE — Progress Notes (Addendum)
The patient is to be NPO after midnight for procedure schedule tomorrow. The patient is aware and consent has been signed and placed on chart. Neck Collar has been placed on the patient yet he removes it when he eats. Iv antibiotics running. Platelets order incase needed during OR.

## 2020-04-15 NOTE — Progress Notes (Signed)
ID Pt with no specific complaints  Patient Vitals for the past 24 hrs:  BP Temp Temp src Pulse Resp SpO2  04/15/20 1156 (!) 166/97 97.8 F (36.6 C) Oral 77 20 98 %  04/15/20 0930 140/85 97.6 F (36.4 C) Oral 88 18 99 %  04/15/20 0641 (!) 172/105 97.8 F (36.6 C) Oral 77 19 98 %  04/15/20 0507 (!) 192/124 97.9 F (36.6 C) Oral 96 19 99 %  04/14/20 1956 139/87 98.6 F (37 C) Oral 85 20 99 %    Awake /alert cehst b/l air entry [id number] Abd soft Left arm weakness and inability to raise the arm    Labs CBC Latest Ref Rng & Units 04/13/2020 04/05/2020 04/01/2020  WBC 4.0 - 10.5 K/uL 8.4 8.0 10.4  Hemoglobin 13.0 - 17.0 g/dL 10.3(L) 9.4(L) 10.1(L)  Hematocrit 39 - 52 % 32.6(L) 29.0(L) 31.7(L)  Platelets 150 - 400 K/uL 349 364 506(H)    CMP Latest Ref Rng & Units 04/13/2020 04/07/2020 04/05/2020  Glucose 70 - 99 mg/dL 213(H) 287(H) 169(H)  BUN 8 - 23 mg/dL 33(H) 32(H) 28(H)  Creatinine 0.61 - 1.24 mg/dL 0.79 0.87 0.55(L)  Sodium 135 - 145 mmol/L 142 142 140  Potassium 3.5 - 5.1 mmol/L 3.7 3.9 3.9  Chloride 98 - 111 mmol/L 110 109 109  CO2 22 - 32 mmol/L 26 25 24   Calcium 8.9 - 10.3 mg/dL 8.3(L) 8.3(L) 8.1(L)  Total Protein 6.5 - 8.1 g/dL - - 5.7(L)  Total Bilirubin 0.3 - 1.2 mg/dL - - 0.7  Alkaline Phos 38 - 126 U/L - - 77  AST 15 - 41 U/L - - 13(L)  ALT 0 - 44 U/L - - 11       Micro 4/29 BC 4/4 MSSA 5/8 BC 4/4 MSSA 5/10 BC NG 5/27 BC NG 6/9 BC NG   .  4/29  CT cervical spine minimally displaced fracture at the anterior margin of C4 with prevertebral fluid related to the fracture  5/12 MRI- No evidence of discitis - prevertebral effusion Extending the length of the cervical spine   04/01/20- C1, dens, C4, C5 superior endplate, T2 and T3 vertebral bodies C5-C7 ACDF - no focal bone lesion Progressive prevetebral soft tissue swelling extending from the base of the clivus to c6 .  04/14/20 - displaced fractures of the anterior and posterior arches of C1 as well as the  tip of odontoid process. progressive diskitis and OM at c4-5- T2-T3  stable findings post C5-C7   Impression/recommendation  MSSA bacteremia with cervical vertebrae multiple level infection eventhough inflammatory markers like ESR, CRP, and thrombocytosis have all improved (or resolved), and blood cultures have remained negative for more than a month and pt is afebrile he is progressing locally at the cervical spine level  Because of the fall he had  fracture at C4 , now he also has fracture of C1. There is osteo of C4-C5 and t2-T3 He has ACDF at C5-C7 done in 2006 and this was not the site of infection in the beginning and was stable. But he has persistent prevertebral phlegmon  extending  now upto C6 concern for hardware  being involved in the infection.Marland Kitchen He is on nafcillin added rifampin. Discussed with Dr.Yarbrough and reviewed MRI films He is getting  washout of the collection and removal of hardware tomorrow.. Post surgery he will be needing IV antibiotics for atleast 4 weeks

## 2020-04-15 NOTE — Progress Notes (Signed)
PROGRESS NOTE    Spencer [name]   PFX:[id number]  DOB: 01-12-1961  PCP: No primary care provider on file.    DOA: 03/13/2020 LOS: 54   Brief Narrative   59 year old male with history of uncontrolled type 2 diabetes mellitus, cocaine use, brain aneurysm, hypertension, GERD recently hospitalized on 4/28 for DKA, MSSA bacteremia and AKI but signed out AMA on 5/5 was brought by EMS with hyperglycemia and encephalopathy.  In the ED was found to be in DKA with high anion gap of 30.  Given aggressive IV hydration and insulin drip with resolution of DKA.  Hospital course prolonged due to presence of recurrent MSSA bacteremia.    DKA resolved.. The patient will need to complete 6 weeks of IV nafcillin prior to discharge per ID.  Last day of therapy would be 04/26/2020.   On the evening of 04/03/2020 the patient developed acute flash pulmonary edema. He was transferred to the ICU and given diuresis and placed on BIPAP.  He had similar presentation on 5/27 requiring transfer to ICU.    Assessment & Plan   Principal Problem:   MSSA bacteremia Active Problems:   DKA (diabetic ketoacidoses) (HCC)   Hypertension   Diabetic neuropathy (HCC)   DM2 (diabetes mellitus, type 2) (HCC)   History of cocaine abuse (HCC)   Personal history of subdural hematoma   AKI (acute kidney injury) (HCC)   GERD (gastroesophageal reflux disease)   C4 cervical fracture (HCC)   Cigarette smoker   Pressure injury of skin   Staph aureus bacteremia, persistent Patient left AMA during last hospital stay.  Blood culture positive on 4/29 and (5/8) positive for MSSA.  2D echo unremarkable.  Repeat blood culture on 5/10 and 5/27, negative. --IV nafcillin for total 6 weeks (until 04/26/2020) per ID.   --Needs to be treated for full course of antibiotic in the hospital. -Getting prednisone taper.  Considering worsening left upper extremity symptoms he underwent MRI of neck which showed fracture of C1 as well and osteomyelitis at  C4-C5 and T2-T3 compared with previous MRI. He has progressed in spite of appropriate antibiotic for the staph aureus, after discussion with ID and neurosurgery yesterday on 6/9, Neurosurgery planning washout and removal of implant tomorrow  Acute pulmonary edema - resolved Unclear etiology.  2D echo from 5/8 with normal EF and no valvular abnormality.  Patient refused TEE.  Repeated 2D echo on 6/1 which did not show any new valvular issues, EF 55-60%.   --Monitor strict I's/O.   --As needed IV Lasix.    Minimally displaced C4 fracture and now new displaced fractures at C1-2 Recommendation to wear Aspen collar at all times but patient refusing.   D/w Patsey Berthold, NP). Dr Marcell Barlow to evaluate the patient today (6/9) Left upper extremity pain and weakness - progressive weakness of the left arm, unable to raise against gravity.   -CT of the cervical spine along with MRI was performed 6/9 showing new displaced fractures at C1-2, with lateral displacement of the lateral masses of C1 relative to C2 [facility] fracture) and a fracture of the tip of the odontoid process. Progressive changes of diskitis and osteomyelitis at C4-5 and T2-3.  --Trial of low dose gabapentin   Cervical and thoracic spine osteomyelitis Seen on MRI of the cervical spine from 5/27 (done with concern for myositis on MRI of the left shoulder).  Showed progressive perivertebral soft tissue swelling with concern for osteomyelitis of C1-C5, T2 and T3 vertebral bodies.  Also has  ventral epidural phlegmon extending from C2-C5 and dorsal epidural phlegmon extending from C3-C4-6 6-C7, without discrete epidural abscess, high-grade stenosis or cord signal abnormality.  Also has an acute to subacute mild T3 superior endplate compression fracture. --ID recommends no escalation of antibiotic.   Neurosurgery planning washout and removal of implant tomorrow on 6/11  Diabetes mellitus type 2, poorly controlled with hyper and  hypoglycemia A1c of 12.6.  Presented with recurrent DKA which has resolved.  Currently on Lantus with Premeal aspart and sliding scale coverage.  Needs close CBG monitoring.  Essential hypertension Stable.  Continue amlodipine and losartan.  Increased losartan to 100 mg as BP uncontrolled.  PRN hydralazine.  Monitor.  Ongoing cocaine use Urine drug screen positive this admission.  He reportedly was getting agitated past 2 days and per sister he was supposed to receive paycheck with which he could buy some drugs.  On as needed clonazepam and Haldol.  Acute kidney injury Prerenal secondary to DKA.  Resolved.  Issues with decision-making capacity Seen by psychiatry multiple times this admission and has capacity.  Cannot be involuntarily committed if he decides to leave AMA.  Depression/Anxiety Continue Klonopin BID, Cymbalta  GERD Continue Pepcid     Patient BMI: Body mass index is 22.24 kg/m.   DVT prophylaxis: Lovenox  Diet:  Diet Orders (From admission, onward)    Start     Ordered   03/15/20 1440  Diet Carb Modified Fluid consistency: Thin; Room service appropriate? Yes with Assist  Diet effective now       Question Answer Comment  Diet-HS Snack? Nothing   Calorie Level Medium 1600-2000   Fluid consistency: Thin   Room service appropriate? Yes with Assist      03/15/20 1439            Code Status: Full Code    Subjective 04/15/20    No new complaints, sister and father at bedside and answered questions  Disposition Plan & Communication   Status is: Inpatient  Remains inpatient appropriate because:IV treatments appropriate due to intensity of illness or inability to take PO   Dispo: The patient is from: Home              Anticipated d/c is to: Home              Anticipated d/c date is: > 3 days (planned final day abx 6/21)              Patient currently is not medically stable to d/c.needs to finish IV Abx while inpt, neurosurgery planning to do washout  and removal of implant tomorrow  Family Communication: Updated sister and father at bedside   Consults, Procedures, Significant Events   Consultants:   Neurosurgery  Infectious Disease  Cardiology  Procedures:   MRI Cervical Spine  2D Echo  Antimicrobials:   IV Nafcillin  5/21 >>    Objective   Vitals:   04/15/20 0507 04/15/20 0641 04/15/20 0930 04/15/20 1156  BP: (!) 192/124 (!) 172/105 140/85 (!) 166/97  Pulse: 96 77 88 77  Resp: 19 19 18 20   Temp: 97.9 F (36.6 C) 97.8 F (36.6 C) 97.6 F (36.4 C)   TempSrc: Oral Oral Oral   SpO2: 99% 98% 99% 98%  Weight:      Height:        Intake/Output Summary (Last 24 hours) at 04/15/2020 1425 Last data filed at 04/15/2020 1417 Gross per 24 hour  Intake 240 ml  Output 2200 ml  Net -1960 ml   Filed Weights   03/13/20 1654 03/13/20 2055 04/01/20 2230  Weight: 59 kg 60.3 kg 70.3 kg    Physical Exam:  General exam: awake, alert, no acute distress, disheveled appearing Respiratory system: CTAB, no wheezes, rales or rhonchi, normal respiratory effort. Cardiovascular system: normal S1/S2, RRR, no pedal edema.   Neurologic: left upper extremity weak, unable to keep raised against gravity, intact grip strength.  Other extremities with motor intact.  No spasticity noted. Extremities: pain with passive ROM in left shoulder flexion and abduction, no peripheral edema   Labs   Data Reviewed: I have personally reviewed following labs and imaging studies  CBC: Recent Labs  Lab 04/13/20 0400  WBC 8.4  HGB 10.3*  HCT 32.6*  MCV 95.0  PLT 349   Basic Metabolic Panel: Recent Labs  Lab 04/13/20 0400  NA 142  K 3.7  CL 110  CO2 26  GLUCOSE 213*  BUN 33*  CREATININE 0.79  CALCIUM 8.3*   GFR: Estimated Creatinine Clearance: 96.4 mL/min (by C-G formula based on SCr of 0.79 mg/dL). Liver Function Tests: No results for input(s): AST, ALT, ALKPHOS, BILITOT, PROT, ALBUMIN in the last 168 hours. No results for  input(s): LIPASE, AMYLASE in the last 168 hours. No results for input(s): AMMONIA in the last 168 hours. Coagulation Profile: No results for input(s): INR, PROTIME in the last 168 hours. Cardiac Enzymes: No results for input(s): CKTOTAL, CKMB, CKMBINDEX, TROPONINI in the last 168 hours. BNP (last 3 results) No results for input(s): PROBNP in the last 8760 hours. HbA1C: No results for input(s): HGBA1C in the last 72 hours. CBG: Recent Labs  Lab 04/14/20 1120 04/14/20 1552 04/14/20 2121 04/15/20 0750 04/15/20 1206  GLUCAP 201* 136* 389* 268* 217*   Lipid Profile: No results for input(s): CHOL, HDL, LDLCALC, TRIG, CHOLHDL, LDLDIRECT in the last 72 hours. Thyroid Function Tests: No results for input(s): TSH, T4TOTAL, FREET4, T3FREE, THYROIDAB in the last 72 hours. Anemia Panel: No results for input(s): VITAMINB12, FOLATE, FERRITIN, TIBC, IRON, RETICCTPCT in the last 72 hours. Sepsis Labs: No results for input(s): PROCALCITON, LATICACIDVEN in the last 168 hours.  Recent Results (from the past 240 hour(s))  CULTURE, BLOOD (ROUTINE X 2) w Reflex to ID Panel     Status: None (Preliminary result)   Collection Time: 04/14/20  9:12 PM   Specimen: Left Antecubital; Blood  Result Value Ref Range Status   Specimen Description LEFT ANTECUBITAL  Final   Special Requests   Final    BOTTLES DRAWN AEROBIC AND ANAEROBIC Blood Culture adequate volume   Culture   Final    NO GROWTH < 12 HOURS Performed at [facility], [address] [place], [place] [address]    Report Status PENDING  Incomplete  CULTURE, BLOOD (ROUTINE X 2) w Reflex to ID Panel     Status: None (Preliminary result)   Collection Time: 04/14/20  9:23 PM   Specimen: BLOOD LEFT HAND  Result Value Ref Range Status   Specimen Description BLOOD LEFT HAND  Final   Special Requests   Final    BOTTLES DRAWN AEROBIC AND ANAEROBIC Blood Culture adequate volume   Culture   Final    NO GROWTH < 12 HOURS Performed at  [facility], [address] [place], [place] [address]    Report Status PENDING  Incomplete      Imaging Studies   DG Cervical Spine 2 or 3 views  Result Date: 04/13/2020 CLINICAL  DATA:  Fracture of C4 vertebra, closed. EXAM: CERVICAL SPINE - 2-3 VIEW COMPARISON:  Cervical MRI 04/01/2020 FINDINGS: Reversal of normal lordosis. Anterior C5 through C7 fusion. Slight decreased density of the anterior vertebral bodies at this level. C4 fracture on prior imaging is not well-defined by radiograph. The dens is obscured. There is symmetric offset of the lateral masses of C1 on C2. diffuse prevertebral soft tissue thickening. Bones are diffusely under mineralized. IMPRESSION: 1. Anterior C5 through C7 fusion with slight decreased density of anterior vertebral bodies at this level. Multilevel osteomyelitis on prior MRI. 2. Known C4 fracture is not well demonstrated by radiograph. 3. Symmetric offset of the lateral masses of C1 on C2, of unknown significance. 4. Prevertebral soft tissue thickening. Electronically Signed   By: Narda Rutherford M.D.   On: 04/13/2020 22:23   CT CERVICAL SPINE WO CONTRAST  Result Date: 04/14/2020 CLINICAL DATA:  Left arm weakness for 1 week. Known C4 fracture and cervical osteomyelitis. Remote C5-7 ACDF. Recurrent MSSA bacteremia. EXAM: CT CERVICAL SPINE WITHOUT CONTRAST TECHNIQUE: Multidetector CT imaging of the cervical spine was performed without intravenous contrast. Multiplanar CT image reconstructions were also generated. COMPARISON:  Multiple previous studies, including cervical spine CT 03/05/2020 and cervical MRI performed today, 04/01/2020 and 03/17/2020. FINDINGS: Alignment: Mildly progressive kyphosis at C4-5. Skull base and vertebrae: There are new fractures involving the upper cervical spine with comminuted components involving the right anterior arch of C1. The posterior arch of C1 is fractured on the left. There is a fracture of the tip of the odontoid  process. The lateral masses of C1 are laterally displaced relative to C2. The occipital condyles are normally aligned with C1. There is progressive widening and endplate irregularity of the C4-5 disc space worrisome for discitis and osteomyelitis. The right C4-5 facet joint is mildly widened. There is also disc space widening and endplate irregularity at T2-3 which appears new, also worrisome for osteomyelitis. Patient is status post C5-7 ACDF. The hardware is intact at these levels. Soft tissues and spinal canal: Mildly progressive prevertebral soft tissue swelling with ill-defined fluid in the upper cervical spine. Disc levels:  New displaced fractures at C1-2 as described above. C2-3: No significant findings. C3-4: Asymmetric facet hypertrophy on the left contributing to moderate left foraminal narrowing. C4-5: Changes of diskitis and osteomyelitis as described above. Bilateral facet arthropathy, worse on the right with mild widening of the right facet joint. Mild foraminal narrowing bilaterally. The fused C5-6 and C6-7 levels appear stable with posterior osteophytes contributing to chronic foraminal narrowing bilaterally. C7-T1: Grossly stable disc bulging and superior endplate irregularity at T1. Bilateral facet hypertrophy. Stable mild spinal stenosis and foraminal narrowing bilaterally. T1-2: Bilateral facet hypertrophy with stable mild foraminal narrowing bilaterally. T2-3: New changes of diskitis and osteomyelitis. No apparent spinal stenosis. Upper chest: New small left pleural effusion. Emphysematous changes are present in both lung apices. Other: Bilateral carotid atherosclerosis. IMPRESSION: 1. New displaced fractures at C1-2, with lateral displacement of the lateral masses of C1 relative to C2 [facility] fracture) and a fracture of the tip of the odontoid process. 2. Progressive changes of diskitis and osteomyelitis at C4-5 and T2-3. See separate cervical MR examination report. Consider further  evaluation with chest CT. 3. Mildly progressive prevertebral soft tissue swelling with ill-defined fluid in the upper cervical spine, likely related to progressive discitis and osteomyelitis. 4. Stable findings of C5-7 ACDF. Stable chronic foraminal narrowing bilaterally at C5-6 and C6-7. 5. New small left pleural effusion. 6. Aortic Atherosclerosis (ICD10-I70.0)  and Emphysema (ICD10-J43.9). 7. Results will be discussed with the provider after completion of the cervical MRI dictation. Electronically Signed   By: Carey Bullocks M.D.   On: 04/14/2020 11:06   MR CERVICAL SPINE W WO CONTRAST  Result Date: 04/14/2020 CLINICAL DATA:  Left arm weakness for 1 week. Known C4 fracture and cervical osteomyelitis. Remote C5-7 ACDF. Recurrent MSSA bacteremia. EXAM: MRI CERVICAL SPINE WITHOUT AND WITH CONTRAST TECHNIQUE: Multiplanar and multiecho pulse sequences of the cervical spine, to include the craniocervical junction and cervicothoracic junction, were obtained without and with intravenous contrast. CONTRAST:  21mL GADAVIST GADOBUTROL 1 MMOL/ML IV SOLN COMPARISON:  Cervical MRI 04/01/2020 and 03/17/2020. CT today and 03/05/2020. FINDINGS: Alignment: The cervical alignment is stable compared with the most recent MRI with reversal of the usual cervical lordosis. No significant focal angulation or listhesis. Vertebrae: As seen on previous MRI, there is multifocal marrow edema and enhancement involving the bilateral occipital condyles, C1 and C2. On today's CT, there are displaced fractures of the anterior and posterior arches of C1 as well as the tip of the odontoid process. Given the other findings, these fractures may be pathologic. There are progressive changes of diskitis and osteomyelitis at C4-5 and T2-3 compared with previous MRI. There is diffuse marrow edema, endplate irregularity and enhancement at these levels. Endplate changes in the superior endplate of T1 are stable without definite abnormality of the  intervening C7-T1 disc space to confirm osteomyelitis at this level. Stable findings post C5-7 ACDF. Cord: Normal in signal and caliber. No abnormal intradural enhancement. Epidural enhancement anteriorly and posteriorly from C3-4 through C6-7 has not significantly progressed. No focal epidural fluid collection identified. Posterior Fossa, vertebral arteries, paraspinal tissues: The visualized posterior fossa appears unremarkable. Extensive paraspinal inflammatory changes extending from the base of the skull into the upper chest are again noted without focal fluid collection. Disc levels: C1-2: As above, probable pathologic fractures of the anterior and posterior arches of C1 and tip of the odontoid process, better seen on today's CT. Extensive marrow edema and enhancement throughout the C1 and C2 vertebral bodies as well as the clivus and both occipital condyles, suspicious for osteomyelitis. C2-3: Bilateral facet hypertrophy. No significant spinal stenosis or nerve root encroachment. C3-4: Bilateral facet hypertrophy, worse on the left. Mild disc bulging and foraminal narrowing bilaterally. C4-5: As above, progressive changes of diskitis and osteomyelitis with potential involvement of both facet joints. The CSF surrounding the cord is partially effaced. There is severe foraminal narrowing bilaterally. Stable chronic osseous foraminal narrowing bilaterally at the fused C5-6 and C6-7 levels. C7-T1: Stable endplate degenerative changes anteriorly and bilateral facet hypertrophy contributing to mild foraminal narrowing bilaterally. Changes of diskitis and osteomyelitis at T2-3 are partially imaged. IMPRESSION: 1. Progressive changes of diskitis and osteomyelitis at C4-5 and T2-3 compared with previous MRI. 2. Diffuse marrow edema and enhancement of the occipital condyles, C1 and C2 suspicious for osteomyelitis. There are displaced fractures of the anterior and posterior arches of C1 and tip of the odontoid process,  better seen on today's CT and new from previous CT of 03/05/2020. Given the other findings, these fractures may be pathologic. 3. Grossly stable extensive anterior paraspinal inflammatory changes and dural enhancement without focal fluid collection. 4. No cord deformity or abnormal cord signal. The spondylosis and inflammation at C4-5 contribute to spinal stenosis and significant foraminal narrowing bilaterally, potentially contributing to the patient's radicular symptoms. 5. Dr. Zachery Dakins is currently unavailable, but I will attempt to discuss the findings with her  later today. Electronically Signed   By: Carey Bullocks M.D.   On: 04/14/2020 11:29     Medications   Scheduled Meds: . amLODipine  10 mg Oral Daily  . aspirin EC  81 mg Oral Daily  . chlorhexidine  15 mL Mouth Rinse BID  . clonazePAM  0.5 mg Oral BID  . DULoxetine  30 mg Oral Daily  . enoxaparin (LOVENOX) injection  40 mg Subcutaneous Q24H  . famotidine  20 mg Oral Daily  . feeding supplement (GLUCERNA SHAKE)  237 mL Oral BID BM  . furosemide  40 mg Oral BID  . gabapentin  300 mg Oral BID  . haloperidol  0.25 mg Oral BID  . insulin aspart  0-5 Units Subcutaneous QHS  . insulin aspart  0-9 Units Subcutaneous TID WC  . insulin aspart  12 Units Subcutaneous TID WC  . insulin glargine  32 Units Subcutaneous Daily  . losartan  100 mg Oral Daily  . mouth rinse  15 mL Mouth Rinse q12n4p  . methocarbamol  500 mg Oral TID  . multivitamin with minerals  1 tablet Oral Daily  . OLANZapine  5 mg Oral QHS  . polyethylene glycol  17 g Oral Daily  . [START ON 04/16/2020] predniSONE  20 mg Oral Q breakfast   Followed by  . [START ON 04/17/2020] predniSONE  10 mg Oral Q breakfast   Continuous Infusions: . sodium chloride 20.8 mL/hr at 04/01/20 0938  . nafcillin (NAFCIL) continuous infusion 12 g (04/15/20 1338)       LOS: 33 days    Time spent: 15 minutes    Josha Weekley Sherryll Burger, DO [facility]  04/15/2020, 2:25 PM    If 7PM-7AM,  please contact night-coverage. How to contact the [facility] Attending or Consulting provider 7A - 7P or covering provider during after hours 7P -7A, for this patient?    1. Check the care team in [facility] and look for a) attending/consulting [facility] provider listed and b) the [facility] team listed 2. Log into [email or web address] and use [facility] universal password to access. If you do not have the password, please contact the hospital operator. 3. Locate the [facility] provider you are looking for under [facility] and page to a number that you can be directly reached. 4. If you still have difficulty reaching the provider, please page the [facility] (Director on Call) for the Hospitalists listed on amion for assistance.

## 2020-04-15 NOTE — Progress Notes (Signed)
Nutrition Follow-up  DOCUMENTATION CODES:   Severe malnutrition in context of social or environmental circumstances  INTERVENTION:   Continue Glucerna Shake po BID, each supplement provides 220 kcal and 10 grams of protein. Patient prefers chocolate.  Continue double protein portions at meals.  Continue daily MVI.  NUTRITION DIAGNOSIS:   Severe Malnutrition related to social / environmental circumstances (hx substance abuse, inadequate oral intake) as evidenced by severe fat depletion, severe muscle depletion. Ongoing - addressing with nutrition interventions.  GOAL:   Patient will meet greater than or equal to 90% of their needs Met.  MONITOR:   PO intake, Supplement acceptance, Labs, Weight trends, I & O's, Skin  ASSESSMENT:   59 year old male with PMHx of HTN, substance abuse, DM, GERD, brain aneurysm admitted with DKA, MSSA bacteremia, AKI, metabolic encephalopathy.   Pt continues to have good appetite and oral intake; pt eating 100% of meals and drinking Glucerna supplements. No new weight since 5/27; pt is ordered for weekly weights.     Medications reviewed and include: aspirin, lovenox, pepcid, lasix, insulin, MVI, miralax, prednisone, nafcillin   Labs reviewed: BUN 33(H)- 6/8 Hgb 10.3(L), Hct 32.6(L) cbgs- 73, 201, 136, 389, 268 x 24 hrs  Diet Order:   Diet Order            Diet Carb Modified Fluid consistency: Thin; Room service appropriate? Yes with Assist  Diet effective now                EDUCATION NEEDS:   Not appropriate for education at this time  Skin:  Skin Assessment: Skin Integrity Issues: (Stage 2 right ischial tuberosity; unstageable sacrum (1cm x 1.8cm); unstageable right elbow (2cm x 1.5cm x 0.1cm))  Last BM:  6/9  Height:   Ht Readings from Last 1 Encounters:  03/13/20 5' 10"  (1.778 m)   Weight:   Wt Readings from Last 1 Encounters:  04/01/20 70.3 kg   Ideal Body Weight:  75.5 kg  BMI:  Body mass index is 22.24  kg/m.  Estimated Nutritional Needs:   Kcal:  1800-2100  Protein:  90-100 grams  Fluid:  1.8-2 L/day  Koleen Distance MS, RD, LDN Please refer to [facility] for RD and/or RD on-call/weekend/after hours pager

## 2020-04-16 ENCOUNTER — Other Ambulatory Visit: Payer: Self-pay

## 2020-04-16 ENCOUNTER — Inpatient Hospital Stay: Payer: Medicaid Other | Admitting: Certified Registered"

## 2020-04-16 ENCOUNTER — Encounter
Admission: EM | Disposition: A | Discharge status: Skilled Nursing Facility | Payer: Self-pay | Source: Home / Self Care | Attending: Internal Medicine

## 2020-04-16 ENCOUNTER — Inpatient Hospital Stay: Payer: Medicaid Other

## 2020-04-16 ENCOUNTER — Encounter: Payer: Self-pay | Admitting: Internal Medicine

## 2020-04-16 HISTORY — PX: CERVICAL WOUND DEBRIDEMENT: SHX6694

## 2020-04-16 LAB — GLUCOSE, CAPILLARY
Glucose-Capillary: 178 mg/dL — ABNORMAL HIGH (ref 70–99)
Glucose-Capillary: 246 mg/dL — ABNORMAL HIGH (ref 70–99)
Glucose-Capillary: 171 mg/dL — ABNORMAL HIGH (ref 70–99)
Glucose-Capillary: 249 mg/dL — ABNORMAL HIGH (ref 70–99)
Glucose-Capillary: 332 mg/dL — ABNORMAL HIGH (ref 70–99)

## 2020-04-16 SURGERY — CERVICAL WOUND DEBRIDEMENT
Anesthesia: General | Site: Neck

## 2020-04-16 MED ORDER — INSULIN ASPART 100 UNIT/ML ~~LOC~~ SOLN: 6.0000 [IU] | Freq: Once | SUBCUTANEOUS | Status: AC

## 2020-04-16 MED ORDER — FENTANYL CITRATE (PF) 100 MCG/2ML IJ SOLN
INTRAMUSCULAR | Status: AC
  Filled 2020-04-16: qty 2

## 2020-04-16 MED ORDER — BACITRACIN 50000 UNITS IM SOLR
INTRAMUSCULAR | Status: AC
  Filled 2020-04-16: qty 1

## 2020-04-16 MED ORDER — LIDOCAINE HCL (PF) 2 % IJ SOLN
INTRAMUSCULAR | Status: AC
  Filled 2020-04-16: qty 5

## 2020-04-16 MED ORDER — ACETAMINOPHEN 10 MG/ML IV SOLN
INTRAVENOUS | Status: AC
  Filled 2020-04-16: qty 100

## 2020-04-16 MED ORDER — BUPIVACAINE-EPINEPHRINE (PF) 0.5% -1:200000 IJ SOLN
INTRAMUSCULAR | Status: AC
  Filled 2020-04-16: qty 30

## 2020-04-16 MED ORDER — SODIUM CHLORIDE FLUSH 0.9 % IV SOLN
INTRAVENOUS | Status: AC
  Filled 2020-04-16: qty 10

## 2020-04-16 MED ORDER — SODIUM CHLORIDE (PF) 0.9 % IJ SOLN
INTRAMUSCULAR | Status: AC
  Filled 2020-04-16: qty 10

## 2020-04-16 MED ORDER — OXYCODONE HCL 5 MG/5ML PO SOLN: 5.0000 mg | Freq: Once | ORAL | Status: DC | PRN

## 2020-04-16 MED ORDER — LIDOCAINE HCL (CARDIAC) PF 100 MG/5ML IV SOSY
PREFILLED_SYRINGE | INTRAVENOUS | Status: DC | PRN
  Administered 2020-04-16: 100 mg via INTRAVENOUS

## 2020-04-16 MED ORDER — VANCOMYCIN HCL 1000 MG IV SOLR
INTRAVENOUS | Status: DC | PRN
  Administered 2020-04-16: 1000 mg

## 2020-04-16 MED ORDER — MIDAZOLAM HCL 2 MG/2ML IJ SOLN
INTRAMUSCULAR | Status: AC
  Filled 2020-04-16: qty 2

## 2020-04-16 MED ORDER — SODIUM CHLORIDE 0.9 % IV SOLN
INTRAVENOUS | Status: DC | PRN
  Administered 2020-04-16: 50 ug/min via INTRAVENOUS

## 2020-04-16 MED ORDER — INSULIN ASPART 100 UNIT/ML ~~LOC~~ SOLN
SUBCUTANEOUS | Status: AC
  Administered 2020-04-16: 6 [IU] via SUBCUTANEOUS
  Filled 2020-04-16: qty 1

## 2020-04-16 MED ORDER — DEXAMETHASONE SODIUM PHOSPHATE 10 MG/ML IJ SOLN
INTRAMUSCULAR | Status: AC
  Filled 2020-04-16: qty 1

## 2020-04-16 MED ORDER — OXYCODONE HCL 5 MG PO TABS: 5.0000 mg | ORAL_TABLET | Freq: Once | ORAL | Status: DC | PRN

## 2020-04-16 MED ORDER — ONDANSETRON HCL 4 MG/2ML IJ SOLN
INTRAMUSCULAR | Status: DC | PRN
  Administered 2020-04-16: 4 mg via INTRAVENOUS

## 2020-04-16 MED ORDER — MIDAZOLAM HCL 2 MG/2ML IJ SOLN
INTRAMUSCULAR | Status: DC | PRN
  Administered 2020-04-16 (×2): 1 mg via INTRAVENOUS

## 2020-04-16 MED ORDER — THROMBIN 5000 UNITS EX SOLR
CUTANEOUS | Status: DC | PRN
  Administered 2020-04-16: 5000 [IU] via TOPICAL

## 2020-04-16 MED ORDER — DEXMEDETOMIDINE HCL IN NACL 80 MCG/20ML IV SOLN
INTRAVENOUS | Status: AC
  Filled 2020-04-16: qty 20

## 2020-04-16 MED ORDER — FENTANYL CITRATE (PF) 100 MCG/2ML IJ SOLN
25.0000 ug | INTRAMUSCULAR | Status: DC | PRN
  Administered 2020-04-16 (×3): 25 ug via INTRAVENOUS

## 2020-04-16 MED ORDER — FENTANYL CITRATE (PF) 100 MCG/2ML IJ SOLN
INTRAMUSCULAR | Status: AC
  Administered 2020-04-16: 25 ug via INTRAVENOUS
  Filled 2020-04-16: qty 2

## 2020-04-16 MED ORDER — ACETAMINOPHEN 10 MG/ML IV SOLN
INTRAVENOUS | Status: DC | PRN
  Administered 2020-04-16: 1000 mg via INTRAVENOUS

## 2020-04-16 MED ORDER — ROCURONIUM BROMIDE 10 MG/ML (PF) SYRINGE
PREFILLED_SYRINGE | INTRAVENOUS | Status: AC
  Filled 2020-04-16: qty 10

## 2020-04-16 MED ORDER — DEXAMETHASONE SODIUM PHOSPHATE 10 MG/ML IJ SOLN
INTRAMUSCULAR | Status: DC | PRN
  Administered 2020-04-16: 10 mg via INTRAVENOUS

## 2020-04-16 MED ORDER — PROPOFOL 10 MG/ML IV BOLUS
INTRAVENOUS | Status: AC
  Filled 2020-04-16: qty 20

## 2020-04-16 MED ORDER — SODIUM CHLORIDE 0.9 % IV SOLN: INTRAVENOUS | Status: DC

## 2020-04-16 MED ORDER — THROMBIN 5000 UNITS EX SOLR
CUTANEOUS | Status: AC
  Filled 2020-04-16: qty 5000

## 2020-04-16 MED ORDER — SODIUM CHLORIDE 0.9 % IR SOLN
Status: DC | PRN
  Administered 2020-04-16: 1000 mL

## 2020-04-16 MED ORDER — SUGAMMADEX SODIUM 200 MG/2ML IV SOLN
INTRAVENOUS | Status: DC | PRN
  Administered 2020-04-16 (×2): 100 mg via INTRAVENOUS

## 2020-04-16 MED ORDER — VASOPRESSIN 20 UNIT/ML IV SOLN
INTRAVENOUS | Status: DC | PRN
Start: 2020-04-16 — End: 2020-04-16
  Administered 2020-04-16 (×3): 2 [IU] via INTRAVENOUS
  Administered 2020-04-16: 4 [IU] via INTRAVENOUS
  Administered 2020-04-16 (×3): 2 [IU] via INTRAVENOUS

## 2020-04-16 MED ORDER — SUCCINYLCHOLINE CHLORIDE 20 MG/ML IJ SOLN
INTRAMUSCULAR | Status: DC | PRN
  Administered 2020-04-16: 120 mg via INTRAVENOUS

## 2020-04-16 MED ORDER — PROPOFOL 10 MG/ML IV BOLUS
INTRAVENOUS | Status: DC | PRN
  Administered 2020-04-16: 150 mg via INTRAVENOUS

## 2020-04-16 MED ORDER — PHENYLEPHRINE HCL (PRESSORS) 10 MG/ML IV SOLN
INTRAVENOUS | Status: DC | PRN
  Administered 2020-04-16 (×2): 100 ug via INTRAVENOUS
  Administered 2020-04-16: 200 ug via INTRAVENOUS
  Administered 2020-04-16: 100 ug via INTRAVENOUS

## 2020-04-16 MED ORDER — FENTANYL CITRATE (PF) 100 MCG/2ML IJ SOLN
INTRAMUSCULAR | Status: DC | PRN
  Administered 2020-04-16 (×2): 50 ug via INTRAVENOUS

## 2020-04-16 MED ORDER — BUPIVACAINE-EPINEPHRINE (PF) 0.5% -1:200000 IJ SOLN
INTRAMUSCULAR | Status: DC | PRN
  Administered 2020-04-16: 4 mL

## 2020-04-16 MED ORDER — PHENYLEPHRINE HCL (PRESSORS) 10 MG/ML IV SOLN
INTRAVENOUS | Status: AC
  Filled 2020-04-16: qty 1

## 2020-04-16 MED ORDER — VANCOMYCIN HCL 1000 MG IV SOLR
INTRAVENOUS | Status: AC
  Filled 2020-04-16: qty 1000

## 2020-04-16 MED ORDER — VASOPRESSIN 20 UNIT/ML IV SOLN
INTRAVENOUS | Status: AC
  Filled 2020-04-16: qty 1

## 2020-04-16 MED ORDER — ONDANSETRON HCL 4 MG/2ML IJ SOLN
INTRAMUSCULAR | Status: AC
  Filled 2020-04-16: qty 2

## 2020-04-16 MED ORDER — ROCURONIUM BROMIDE 100 MG/10ML IV SOLN
INTRAVENOUS | Status: DC | PRN
  Administered 2020-04-16: 10 mg via INTRAVENOUS
  Administered 2020-04-16: 50 mg via INTRAVENOUS

## 2020-04-16 SURGICAL SUPPLY — 60 items
BASKET BONE COLLECTION (BASKET) IMPLANT
BLADE BOVIE TIP EXT 4 (BLADE) ×2 IMPLANT
BULB RESERV EVAC DRAIN JP 100C (MISCELLANEOUS) IMPLANT
BUR NEURO DRILL SOFT 3.0X3.8M (BURR) ×2 IMPLANT
CANISTER SUCT 1200ML W/VALVE (MISCELLANEOUS) ×4 IMPLANT
CHLORAPREP W/TINT 26 (MISCELLANEOUS) ×4 IMPLANT
COUNTER NEEDLE 20/40 LG (NEEDLE) ×2 IMPLANT
COVER LIGHT HANDLE STERIS (MISCELLANEOUS) ×4 IMPLANT
COVER WAND RF STERILE (DRAPES) ×2 IMPLANT
CRADLE LAMINECT ARM (MISCELLANEOUS) ×2 IMPLANT
CUP MEDICINE 2OZ PLAST GRAD ST (MISCELLANEOUS) ×2 IMPLANT
DERMABOND ADVANCED (GAUZE/BANDAGES/DRESSINGS) ×1
DERMABOND ADVANCED .7 DNX12 (GAUZE/BANDAGES/DRESSINGS) ×1 IMPLANT
DRAIN CHANNEL JP 10F RND 20C F (MISCELLANEOUS) IMPLANT
DRAPE C-ARM 42X72 X-RAY (DRAPES) ×4 IMPLANT
DRAPE LAPAROTOMY 77X122 PED (DRAPES) ×2 IMPLANT
DRAPE MICROSCOPE SPINE 48X150 (DRAPES) ×2 IMPLANT
DRAPE POUCH INSTRU U-SHP 10X18 (DRAPES) ×2 IMPLANT
DRAPE SURG 17X11 SM STRL (DRAPES) ×8 IMPLANT
ELECT CAUTERY BLADE TIP 2.5 (TIP) ×2
ELECT REM PT RETURN 9FT ADLT (ELECTROSURGICAL) ×2
ELECTRODE CAUTERY BLDE TIP 2.5 (TIP) ×1 IMPLANT
ELECTRODE REM PT RTRN 9FT ADLT (ELECTROSURGICAL) ×1 IMPLANT
FEE INTRAOP MONITOR IMPULS NCS (MISCELLANEOUS) IMPLANT
FRAME EYE SHIELD (PROTECTIVE WEAR) ×4 IMPLANT
GLOVE BIOGEL PI IND STRL 7.0 (GLOVE) ×1 IMPLANT
GLOVE BIOGEL PI INDICATOR 7.0 (GLOVE) ×1
GLOVE SURG SYN 7.0 (GLOVE) ×4 IMPLANT
GLOVE SURG SYN 7.0 PF PI (GLOVE) ×2 IMPLANT
GLOVE SURG SYN 8.5  E (GLOVE) ×3
GLOVE SURG SYN 8.5 E (GLOVE) ×3 IMPLANT
GLOVE SURG SYN 8.5 PF PI (GLOVE) ×3 IMPLANT
GOWN SRG XL LVL 3 NONREINFORCE (GOWNS) ×1 IMPLANT
GOWN STRL NON-REIN TWL XL LVL3 (GOWNS) ×1
GOWN STRL REUS W/ TWL XL LVL3 (GOWN DISPOSABLE) ×1 IMPLANT
GOWN STRL REUS W/TWL MED LVL3 (GOWN DISPOSABLE) ×2 IMPLANT
GOWN STRL REUS W/TWL XL LVL3 (GOWN DISPOSABLE) ×1
GRADUATE 1200CC STRL 31836 (MISCELLANEOUS) ×2 IMPLANT
INTRAOP MONITOR FEE IMPULS NCS (MISCELLANEOUS)
INTRAOP MONITOR FEE IMPULSE (MISCELLANEOUS)
KIT TURNOVER KIT A (KITS) ×2 IMPLANT
MARKER SKIN DUAL TIP RULER LAB (MISCELLANEOUS) ×4 IMPLANT
NDL SAFETY ECLIPSE 18X1.5 (NEEDLE) ×1 IMPLANT
NEEDLE HYPO 18GX1.5 SHARP (NEEDLE) ×1
NEEDLE HYPO 22GX1.5 SAFETY (NEEDLE) ×2 IMPLANT
NS IRRIG 1000ML POUR BTL (IV SOLUTION) ×2 IMPLANT
PACK LAMINECTOMY NEURO (CUSTOM PROCEDURE TRAY) ×2 IMPLANT
PIN CASPAR 14 (PIN) ×1 IMPLANT
PIN CASPAR 14MM (PIN) ×2 IMPLANT
SPOGE SURGIFLO 8M (HEMOSTASIS) ×1
SPONGE KITTNER 5P (MISCELLANEOUS) ×2 IMPLANT
SPONGE SURGIFLO 8M (HEMOSTASIS) ×1 IMPLANT
STAPLER SKIN PROX 35W (STAPLE) IMPLANT
SUT V-LOC 90 ABS DVC 3-0 CL (SUTURE) ×2 IMPLANT
SUT VIC AB 3-0 SH 8-18 (SUTURE) ×2 IMPLANT
SYR 30ML LL (SYRINGE) ×2 IMPLANT
TAPE CLOTH 3X10 WHT NS LF (GAUZE/BANDAGES/DRESSINGS) ×2 IMPLANT
TOWEL OR 17X26 4PK STRL BLUE (TOWEL DISPOSABLE) ×6 IMPLANT
TRAY FOLEY MTR SLVR 16FR STAT (SET/KITS/TRAYS/PACK) IMPLANT
TUBING CONNECTING 10 (TUBING) ×2 IMPLANT

## 2020-04-16 NOTE — OR Nursing (Signed)
Removed 1 plate and 6 screws from anterior neck.

## 2020-04-16 NOTE — Interval H&P Note (Signed)
History and Physical Interval Note:  04/16/2020 7:02 AM  Spencer [name]  has presented today for surgery, with the diagnosis of discitis.  The various methods of treatment have been discussed with the patient and family. After consideration of risks, benefits and other options for treatment, the patient has consented to  Procedure(s): washout of prevertebral abscess, disc biopsy, removal of anterior instrumentation (N/A) as a surgical intervention.  The patient's history has been reviewed, patient examined, no change in status, stable for surgery.  I have reviewed the patient's chart and labs.  Questions were answered to the patient's satisfaction.    Heart sounds normal no MRG. Chest Clear to Auscultation Bilaterally.   [name]

## 2020-04-16 NOTE — Progress Notes (Signed)
PROGRESS NOTE    Spencer [name]   NOB:[id number]  DOB: 02-01-1961  PCP: No primary care provider on file.    DOA: 03/13/2020 LOS: 30   Brief Narrative   59 year old male with history of uncontrolled type 2 diabetes mellitus, cocaine use, brain aneurysm, hypertension, GERD recently hospitalized on 4/28 for DKA, MSSA bacteremia and AKI but signed out AMA on 5/5 was brought by EMS with hyperglycemia and encephalopathy.  In the ED was found to be in DKA with high anion gap of 30.  Given aggressive IV hydration and insulin drip with resolution of DKA.  Hospital course prolonged due to presence of recurrent MSSA bacteremia.    DKA resolved.. The patient will need to complete 6 weeks of IV nafcillin prior to discharge per ID.  Last day of therapy would be 04/26/2020.   On the evening of 04/03/2020 the patient developed acute flash pulmonary edema. He was transferred to the ICU and given diuresis and placed on BIPAP.  He had similar presentation on 5/27 requiring transfer to ICU.    Assessment & Plan   Principal Problem:   MSSA bacteremia Active Problems:   DKA (diabetic ketoacidoses) (HCC)   Hypertension   Diabetic neuropathy (HCC)   DM2 (diabetes mellitus, type 2) (HCC)   History of cocaine abuse (HCC)   Personal history of subdural hematoma   AKI (acute kidney injury) (HCC)   GERD (gastroesophageal reflux disease)   C4 cervical fracture (HCC)   Cigarette smoker   Pressure injury of skin   Staph aureus bacteremia, persistent Patient left AMA during last hospital stay.  Blood culture positive on 4/29 and (5/8) positive for MSSA.  2D echo unremarkable.  Repeat blood culture on 5/10 and 5/27, negative. --IV nafcillin for total 6 weeks (until 04/26/2020) per ID.   --Needs to be treated for full course of antibiotic in the hospital. -Getting prednisone taper.  Considering worsening left upper extremity symptoms he underwent MRI of neck which showed fracture of C1 as well and osteomyelitis at  C4-C5 and T2-T3 compared with previous MRI. He has progressed in spite of appropriate antibiotic for the staph aureus, after discussion with ID and neurosurgery yesterday on 6/9, s/p washout and removal of implant on 6/11 - pain meds prn (oxycodoen 10 mg Q 4 hrs prn)  Acute pulmonary edema - resolved Unclear etiology.  2D echo from 5/8 with normal EF and no valvular abnormality.  Patient refused TEE.  Repeated 2D echo on 6/1 which did not show any new valvular issues, EF 55-60%.   --Monitor strict I's/O.   --As needed IV Lasix.    Minimally displaced C4 fracture and now new displaced fractures at C1-2 Recommendation to wear Aspen collar at all times but patient refusing.   D/w Patsey Berthold, NP). Dr Marcell Barlow to evaluate the patient today (6/9) Left upper extremity pain and weakness - progressive weakness of the left arm, unable to raise against gravity.   -CT of the cervical spine along with MRI was performed 6/9 showing new displaced fractures at C1-2, with lateral displacement of the lateral masses of C1 relative to C2 [facility] fracture) and a fracture of the tip of the odontoid process. Progressive changes of diskitis and osteomyelitis at C4-5 and T2-3.  --continue low dose gabapentin   Cervical and thoracic spine osteomyelitis Seen on MRI of the cervical spine from 5/27 (done with concern for myositis on MRI of the left shoulder).  Showed progressive perivertebral soft tissue swelling with concern for osteomyelitis  of C1-C5, T2 and T3 vertebral bodies.  Also has ventral epidural phlegmon extending from C2-C5 and dorsal epidural phlegmon extending from C3-C4-6 6-C7, without discrete epidural abscess, high-grade stenosis or cord signal abnormality.  Also has an acute to subacute mild T3 superior endplate compression fracture. --ID recommends no escalation of antibiotic.  - s/p Anterior cervical washout and debridement with removal of Anterior cervical instrumentation at C5-7 & C4-5 disc  biopsy by Neurosurgery on 6/11  Diabetes mellitus type 2, poorly controlled with hyper and hypoglycemia A1c of 12.6.  Presented with recurrent DKA which has resolved.  Currently on Lantus with Premeal aspart and sliding scale coverage.  Needs close CBG monitoring.  Essential hypertension Stable.  Continue amlodipine and losartan.  Increased losartan to 100 mg as BP uncontrolled.  PRN hydralazine.  Monitor.  Ongoing cocaine use Urine drug screen positive this admission.  He reportedly was getting agitated past 2 days and per sister he was supposed to receive paycheck with which he could buy some drugs.  On as needed clonazepam and Haldol.  Acute kidney injury Prerenal secondary to DKA.  Resolved.  Issues with decision-making capacity Seen by psychiatry multiple times this admission and has capacity.  Cannot be involuntarily committed if he decides to leave AMA.  Depression/Anxiety Continue Klonopin BID, Cymbalta  GERD Continue Pepcid     Patient BMI: Body mass index is 22.24 kg/m.   DVT prophylaxis: Lovenox  Diet:  Diet Orders (From admission, onward)    Start     Ordered   04/16/20 1108  Diet heart healthy/carb modified Room service appropriate? Yes; Fluid consistency: Thin  Diet effective now       Question Answer Comment  Diet-HS Snack? Nothing   Room service appropriate? Yes   Fluid consistency: Thin      04/16/20 1107            Code Status: Full Code    Subjective 04/16/20    In some pain at operative site, drain in place, wants to eat  Disposition Plan & Communication   Status is: Inpatient  Remains inpatient appropriate because:IV treatments appropriate due to intensity of illness or inability to take PO   Dispo: The patient is from: Home              Anticipated d/c is to: Home              Anticipated d/c date is: > 3 days (planned final day abx 6/21)              Patient currently is not medically stable to d/c.needs to finish IV Abx while  inpt  Family Communication: Updated sister and father at bedside on 6/10   Consults, Procedures, Significant Events   Consultants:   Neurosurgery  Infectious Disease  Cardiology  Procedures:   MRI Cervical Spine  2D Echo  Antimicrobials:   IV Nafcillin  5/21 >>    Objective   Vitals:   04/16/20 1009 04/16/20 1020 04/16/20 1024 04/16/20 1104  BP: 106/70  106/62   Pulse: 94 90 89   Resp: 11 10 10    Temp:   (!) 97.1 F (36.2 C)   TempSrc:      SpO2: (!) 88% 92% 91% 97%  Weight:      Height:        Intake/Output Summary (Last 24 hours) at 04/16/2020 1138 Last data filed at 04/16/2020 1031 Gross per 24 hour  Intake 2087.6 ml  Output 1875 ml  Net 212.6 ml   Filed Weights   03/13/20 1654 03/13/20 2055 04/01/20 2230  Weight: 59 kg 60.3 kg 70.3 kg    Physical Exam:  General exam: awake, alert, no acute distress, disheveled appearing Respiratory system: CTAB, no wheezes, rales or rhonchi, normal respiratory effort. Cardiovascular system: normal S1/S2, RRR, no pedal edema.   Neurologic: left upper extremity weak, unable to keep raised against gravity, intact grip strength.  Other extremities with motor intact.  No spasticity noted. Extremities: pain with passive ROM in left shoulder flexion and abduction, no peripheral edema   Labs   Data Reviewed: I have personally reviewed following labs and imaging studies  CBC: Recent Labs  Lab 04/13/20 0400  WBC 8.4  HGB 10.3*  HCT 32.6*  MCV 95.0  PLT 353   Basic Metabolic Panel: Recent Labs  Lab 04/13/20 0400  NA 142  K 3.7  CL 110  CO2 26  GLUCOSE 213*  BUN 33*  CREATININE 0.79  CALCIUM 8.3*   GFR: Estimated Creatinine Clearance: 96.4 mL/min (by C-G formula based on SCr of 0.79 mg/dL). Liver Function Tests: No results for input(s): AST, ALT, ALKPHOS, BILITOT, PROT, ALBUMIN in the last 168 hours. No results for input(s): LIPASE, AMYLASE in the last 168 hours. No results for input(s): AMMONIA in  the last 168 hours. Coagulation Profile: No results for input(s): INR, PROTIME in the last 168 hours. Cardiac Enzymes: No results for input(s): CKTOTAL, CKMB, CKMBINDEX, TROPONINI in the last 168 hours. BNP (last 3 results) No results for input(s): PROBNP in the last 8760 hours. HbA1C: No results for input(s): HGBA1C in the last 72 hours. CBG: Recent Labs  Lab 04/15/20 1700 04/15/20 2130 04/16/20 0703 04/16/20 0927 04/16/20 1132  GLUCAP 229* 177* 178* 246* 171*   Lipid Profile: No results for input(s): CHOL, HDL, LDLCALC, TRIG, CHOLHDL, LDLDIRECT in the last 72 hours. Thyroid Function Tests: No results for input(s): TSH, T4TOTAL, FREET4, T3FREE, THYROIDAB in the last 72 hours. Anemia Panel: No results for input(s): VITAMINB12, FOLATE, FERRITIN, TIBC, IRON, RETICCTPCT in the last 72 hours. Sepsis Labs: No results for input(s): PROCALCITON, LATICACIDVEN in the last 168 hours.  Recent Results (from the past 240 hour(s))  CULTURE, BLOOD (ROUTINE X 2) w Reflex to ID Panel     Status: None (Preliminary result)   Collection Time: 04/14/20  9:12 PM   Specimen: Left Antecubital; Blood  Result Value Ref Range Status   Specimen Description LEFT ANTECUBITAL  Final   Special Requests   Final    BOTTLES DRAWN AEROBIC AND ANAEROBIC Blood Culture adequate volume   Culture   Final    NO GROWTH 2 DAYS Performed at [facility], [address] [place], [place] [address]    Report Status PENDING  Incomplete  CULTURE, BLOOD (ROUTINE X 2) w Reflex to ID Panel     Status: None (Preliminary result)   Collection Time: 04/14/20  9:22 PM   Specimen: BLOOD  Result Value Ref Range Status   Specimen Description   Final    BLOOD BLOOD LEFT HAND Performed at [facility], [address] [place], [place] [address]    Special Requests   Final    BOTTLES DRAWN AEROBIC AND ANAEROBIC Blood Culture adequate volume Performed at [facility], [address]  [place], [place] [address]    Culture  Setup Time   Final    GRAM POSITIVE COCCI AEROBIC BOTTLE ONLY CRITICAL RESULT CALLED TO, READ BACK BY AND VERIFIED  WITH: JASON ROBBINS AT 1808 04/15/20 BY [email or web address]    Culture   Final    GRAM POSITIVE COCCI IDENTIFICATION TO FOLLOW Performed at [facility], [address] [place], [place] [address]    Report Status PENDING  Incomplete  Blood Culture ID Panel (Reflexed)     Status: Abnormal   Collection Time: 04/14/20  9:23 PM  Result Value Ref Range Status   Enterococcus species NOT DETECTED NOT DETECTED Final   Listeria monocytogenes NOT DETECTED NOT DETECTED Final   Staphylococcus species DETECTED (A) NOT DETECTED Final    Comment: Methicillin (oxacillin) resistant coagulase negative staphylococcus. Possible blood culture contaminant (unless isolated from more than one blood culture draw or clinical case suggests pathogenicity). No antibiotic treatment is indicated for blood  culture contaminants. CRITICAL RESULT CALLED TO, READ BACK BY AND VERIFIED WITH: JASON ROBBINS 04/15/20 @1808  BY ACR    Staphylococcus aureus (BCID) NOT DETECTED NOT DETECTED Final   Methicillin resistance DETECTED (A) NOT DETECTED Final    Comment: CRITICAL RESULT CALLED TO, READ BACK BY AND VERIFIED WITH: JASON ROBBINS 04/15/20 @1808  BY ACR    Streptococcus species NOT DETECTED NOT DETECTED Final   Streptococcus agalactiae NOT DETECTED NOT DETECTED Final   Streptococcus pneumoniae NOT DETECTED NOT DETECTED Final   Streptococcus pyogenes NOT DETECTED NOT DETECTED Final   Acinetobacter baumannii NOT DETECTED NOT DETECTED Final   Enterobacteriaceae species NOT DETECTED NOT DETECTED Final   Enterobacter cloacae complex NOT DETECTED NOT DETECTED Final   Escherichia coli NOT DETECTED NOT DETECTED Final   Klebsiella oxytoca NOT DETECTED NOT DETECTED Final   Klebsiella pneumoniae NOT DETECTED NOT DETECTED Final   Proteus species NOT DETECTED NOT DETECTED Final   Serratia  marcescens NOT DETECTED NOT DETECTED Final   Haemophilus influenzae NOT DETECTED NOT DETECTED Final   Neisseria meningitidis NOT DETECTED NOT DETECTED Final   Pseudomonas aeruginosa NOT DETECTED NOT DETECTED Final   Candida albicans NOT DETECTED NOT DETECTED Final   Candida glabrata NOT DETECTED NOT DETECTED Final   Candida krusei NOT DETECTED NOT DETECTED Final   Candida parapsilosis NOT DETECTED NOT DETECTED Final   Candida tropicalis NOT DETECTED NOT DETECTED Final    Comment: Performed at [facility] [address], [place], [address] [place]      Imaging Studies   No results found.   Medications   Scheduled Meds: . amLODipine  10 mg Oral Daily  . aspirin EC  81 mg Oral Daily  . chlorhexidine  15 mL Mouth Rinse BID  . Chlorhexidine Gluconate Cloth  6 each Topical Q0600  . clonazePAM  0.5 mg Oral BID  . DULoxetine  30 mg Oral Daily  . famotidine  20 mg Oral Daily  . feeding supplement (GLUCERNA SHAKE)  237 mL Oral BID BM  . furosemide  40 mg Oral BID  . gabapentin  300 mg Oral BID  . haloperidol  0.25 mg Oral BID  . insulin aspart  0-5 Units Subcutaneous QHS  . insulin aspart  0-9 Units Subcutaneous TID WC  . insulin aspart  12 Units Subcutaneous TID WC  . insulin glargine  32 Units Subcutaneous Daily  . losartan  100 mg Oral Daily  . mouth rinse  15 mL Mouth Rinse q12n4p  . methocarbamol  500 mg Oral TID  . multivitamin with minerals  1 tablet Oral Daily  . OLANZapine  5 mg Oral QHS  . polyethylene glycol  17 g Oral Daily  . predniSONE  20 mg Oral Q breakfast   Followed by  . [START ON 04/17/2020] predniSONE  10 mg Oral Q breakfast  . rifampin  300 mg Oral Q12H   Continuous Infusions: . sodium chloride 20.8 mL/hr at 04/01/20 0938  . sodium chloride 75 mL/hr at 04/16/20 1110  . nafcillin (NAFCIL) continuous infusion Stopped (04/16/20 0614)       LOS: 34 days    Time spent: 15 minutes    Spencer Gipe Sherryll BurgerShah, DO [facility]  04/16/2020, 11:38  AM    If 7PM-7AM, please contact night-coverage. How to contact the [facility] Attending or Consulting provider 7A - 7P or covering provider during after hours 7P -7A, for this patient?    1. Check the care team in [facility] and look for a) attending/consulting [facility] provider listed and b) the [facility] team listed 2. Log into [email or web address] and use [place]'s universal password to access. If you do not have the password, please contact the hospital operator. 3. Locate the [facility] provider you are looking for under [facility] and page to a number that you can be directly reached. 4. If you still have difficulty reaching the provider, please page the [facility] (Director on Call) for the Hospitalists listed on amion for assistance.

## 2020-04-16 NOTE — Op Note (Addendum)
Indications: Mr. Spencer [name] is a 59 yo male who presented with cervical discitis at C4-5 and had worsening imaging findings.  After review with ID, debridement and removal of his ACDF plate were recommended.  Findings: erosion at C4-5  Preoperative Diagnosis: Cervical discitis Postoperative Diagnosis: same   EBL: 25 ml IVF: 700 ml Drains: 1 placed Disposition: Extubated and Stable to PACU Complications: none  No foley catheter was placed.   Preoperative Note:   Risks of surgery discussed include: infection, bleeding, stroke, coma, death, paralysis, CSF leak, nerve/spinal cord injury, numbness, tingling, weakness, complex regional pain syndrome, recurrent stenosis and/or disc herniation, vascular injury, development of instability, neck/back pain, need for further surgery, persistent symptoms, development of deformity, and the risks of anesthesia. The patient understood these risks and agreed to proceed.  Procedure:  1) Anterior cervical washout and debridement 2) Removal of Anterior cervical instrumentation at C5-7 3) C4-5 disc biopsy   Procedure: After obtaining informed consent, the patient taken to the operating room, placed in supine position, general anesthesia induced.  The patient had a small shoulder roll placed behind their shoulders.  The patient received preop antibiotics.  The patient had the prior incision identified. The incision was opened, dissection taken down medial to the carotid artery and jugular vein, lateral to the trachea and esophagus.  The prevertebral fascia identified and the plate palpated.  The plate was fully exposed and removed.  We extended the prevertebral dissection superiorly, where we encountered substantial firm soft tissue.  This was carefully freed up until the anterior portion of C3 and C4 were palpable.  No obvious pus was noted.  The prevertebral space and C4-5 disc were cultured.  The disc was biopsied.  The disc was debrided.    We performed  excisional debridement and biopsy using curettes and kerrison rongeurs.   The wound was irrigated copiously with bacitracin-containing solution and meticulous hemostasis obtained.  Vancomycin powder was placed.  A drain was placed. Wound was closed in 2 layers using interrupted inverted 3-0 Vicryl sutures.  The wound was dressed with dermabond, the head of bed at 30 degrees, taken to recovery room in stable condition.  No new postop neurological deficits were identified.  Sponge and pattie counts were correct at the end of the procedure.    I performed the entire procedure with Patsey Berthold NP as an Designer, television/film set.  Venetia Night MD

## 2020-04-16 NOTE — Anesthesia Postprocedure Evaluation (Signed)
Anesthesia Post Note  Patient: Spencer [name]  Procedure(s) Performed: washout of prevertebral abscess, disc biopsy, removal of anterior instrumentation (N/A Neck)  Patient location during evaluation: PACU Anesthesia Type: General Level of consciousness: awake and alert Pain management: pain level controlled Vital Signs Assessment: post-procedure vital signs reviewed and stable Respiratory status: spontaneous breathing, nonlabored ventilation, respiratory function stable and patient connected to nasal cannula oxygen Cardiovascular status: blood pressure returned to baseline and stable Postop Assessment: no apparent nausea or vomiting Anesthetic complications: no   No complications documented.   Last Vitals:  Vitals:   04/16/20 1020 04/16/20 1024  BP:  106/62  Pulse: 90 89  Resp: 10 10  Temp:  (!) 36.2 C  SpO2: 92% 91%    Last Pain:  Vitals:   04/16/20 1024  TempSrc:   PainSc: 4                  Cleda Mccreedy Edeline Greening

## 2020-04-16 NOTE — OR Nursing (Signed)
Patient arrived via hospital bed without neck brace in place.  Dr Myer Haff reapplied brace.

## 2020-04-16 NOTE — Progress Notes (Addendum)
ID Pt underwent washout and removal of hardware from the cervical vertebra Says he is Feeling okay Says pain controlled  O/e Patient Vitals for the past 24 hrs:  BP Temp Temp src Pulse Resp SpO2  04/16/20 1422 131/88 98.6 F (37 C) Oral 95 16 100 %  04/16/20 1254 124/90 98.7 F (37.1 C) Oral (!) 101 16 99 %  04/16/20 1217 118/81 -- -- 80 18 --  04/16/20 1104 -- -- -- -- -- 97 %  04/16/20 1024 106/62 (!) 97.1 F (36.2 C) -- 89 10 91 %  04/16/20 1020 -- -- -- 90 10 92 %  04/16/20 1009 106/70 -- -- 94 11 (!) 88 %  04/16/20 0954 115/82 -- -- 100 13 100 %  04/16/20 0939 113/74 -- -- 95 14 99 %  04/16/20 0929 110/84 -- -- -- 16 95 %  04/16/20 0924 110/68 (!) 97 F (36.1 C) -- 79 12 94 %  04/16/20 0703 (!) 159/94 98.7 F (37.1 C) Temporal 81 18 100 %  04/16/20 0411 (!) 161/96 98.1 F (36.7 C) Oral 81 20 99 %  04/15/20 1931 118/84 98.4 F (36.9 C) Oral 87 20 98 %    Awake and alert  Cervical collar present Drain Present Chest b/l air entry [id number]   Labs CBC Latest Ref Rng & Units 04/13/2020 04/05/2020 04/01/2020  WBC 4.0 - 10.5 K/uL 8.4 8.0 10.4  Hemoglobin 13.0 - 17.0 g/dL 10.3(L) 9.4(L) 10.1(L)  Hematocrit 39 - 52 % 32.6(L) 29.0(L) 31.7(L)  Platelets 150 - 400 K/uL 349 364 506(H)   CMP Latest Ref Rng & Units 04/13/2020 04/07/2020 04/05/2020  Glucose 70 - 99 mg/dL 213(H) 287(H) 169(H)  BUN 8 - 23 mg/dL 33(H) 32(H) 28(H)  Creatinine 0.61 - 1.24 mg/dL 0.79 0.87 0.55(L)  Sodium 135 - 145 mmol/L 142 142 140  Potassium 3.5 - 5.1 mmol/L 3.7 3.9 3.9  Chloride 98 - 111 mmol/L 110 109 109  CO2 22 - 32 mmol/L 26 25 24   Calcium 8.9 - 10.3 mg/dL 8.3(L) 8.3(L) 8.1(L)  Total Protein 6.5 - 8.1 g/dL - - 5.7(L)  Total Bilirubin 0.3 - 1.2 mg/dL - - 0.7  Alkaline Phos 38 - 126 U/L - - 77  AST 15 - 41 U/L - - 13(L)  ALT 0 - 44 U/L - - 11        Micro 4/29 BC 4/4 MSSA 5/8 BC 4/4 MSSA 5/10 BC NG 5/27 BC NG 6/9 BC NG   .  4/29  CT cervical spine minimally displaced fracture  at the anterior margin of C4 with prevertebral fluid related to the fracture  5/12 MRI- No evidence of discitis - prevertebral effusion Extending the length of the cervical spine   04/01/20- C1, dens, C4, C5 superior endplate, T2 and T3 vertebral bodies C5-C7 ACDF - no focal bone lesion Progressive prevetebral soft tissue swelling extending from the base of the clivus to c6 .  04/14/20 - displaced fractures of the anterior and posterior arches of C1 as well as the tip of odontoid process. progressive diskitis and OM at c4-5- T2-T3  stable findings post C5-C7   Impression/recommendation  MSSA bacteremia with cervical vertebrae multiple level infection eventhough inflammatory markers like ESR, CRP, and thrombocytosis have all improved (or resolved), and blood cultures have remained negative for more than a month and pt is afebrile he is progressing locally at the cervical spine level  Because of the fall he had  fracture at C4 , now he also  has fracture of C1. There is osteo of C4-C5 and t2-T3 He has ACDF at C5-C7 done in 2006 and this was not the site of infection in the beginning and was stable.     But he has persistent Prevertebral phlegmon  extending  now upto C6 concern for hardware  being involved in the infection.Marland Kitchen  He is on nafcillin /rifampin. Discussed with Dr.Yarbrough and reviewed MRI films He had a washout and removal of the hardware today. Cultures sent. Post surgery he will be needing IV antibiotics for atleast 4 weeks depending on the culture result  Discussed the management with care team

## 2020-04-16 NOTE — Anesthesia Preprocedure Evaluation (Signed)
Anesthesia Evaluation  Patient identified by MRN, date of birth, ID band Patient awake    Reviewed: Allergy & Precautions, H&P , NPO status , Patient's Chart, lab work & pertinent test results  History of Anesthesia Complications Negative for: history of anesthetic complications  Airway Mallampati: III  TM Distance: >3 FB Neck ROM: full    Dental  (+) Chipped   Pulmonary neg shortness of breath, COPD, Current Smoker and Patient abstained from smoking.,    Pulmonary exam normal        Cardiovascular Exercise Tolerance: Good hypertension, Normal cardiovascular exam+ dysrhythmias      Neuro/Psych negative neurological ROS  negative psych ROS   GI/Hepatic negative GI ROS, Neg liver ROS, GERD  ,  Endo/Other  negative endocrine ROSdiabetes  Renal/GU Renal disease     Musculoskeletal   Abdominal   Peds  Hematology negative hematology ROS (+)   Anesthesia Other Findings Past Medical History: No date: Brain aneurysm No date: Broken bones     Comment:  clavical, ankle, arms toes wriast  No date: Diabetes mellitus without complication (HCC) No date: Dysrhythmia No date: GERD (gastroesophageal reflux disease) No date: Hypertension No date: Substance abuse (HCC)  Past Surgical History: 09/04/2019: CORONARY/GRAFT ACUTE MI REVASCULARIZATION; N/A     Comment:  Procedure: Coronary/Graft Acute MI Revascularization;                Surgeon: Alwyn Pea, MD;  Location: [facility] INVASIVE              CV LAB;  Service: Cardiovascular;  Laterality: N/A; 06/04/2019: ESOPHAGOGASTRODUODENOSCOPY (EGD) WITH PROPOFOL; N/A     Comment:  Procedure: ESOPHAGOGASTRODUODENOSCOPY (EGD) WITH               PROPOFOL;  Surgeon: Toledo, Boykin Nearing, MD;  Location:               [facility] ENDOSCOPY;  Service: Gastroenterology;  Laterality:               N/A; No date: HERNIA REPAIR No date: IMPLANTATION / PLACEMENT OF STRIP ELECTRODES VIA BURR HOLES   SUBDURAL     Comment:  anyrusum 09/04/2019: LEFT HEART CATH AND CORONARY ANGIOGRAPHY; N/A     Comment:  Procedure: LEFT HEART CATH AND CORONARY ANGIOGRAPHY;                Surgeon: Alwyn Pea, MD;  Location: [facility] INVASIVE              CV LAB;  Service: Cardiovascular;  Laterality: N/A; No date: NECK SURGERY 06/03/2019: TEE WITHOUT CARDIOVERSION; N/A     Comment:  Procedure: TRANSESOPHAGEAL ECHOCARDIOGRAM (TEE);                Surgeon: Dalia Heading, MD;  Location: [facility] ORS;                Service: Cardiovascular;  Laterality: N/A;  BMI    Body Mass Index: 22.24 kg/m      Reproductive/Obstetrics negative OB ROS                             Anesthesia Physical Anesthesia Plan  ASA: III  Anesthesia Plan: General ETT   Post-op Pain Management:    Induction: Intravenous  PONV Risk Score and Plan: Ondansetron, Dexamethasone, Midazolam and Treatment may vary due to age or medical condition  Airway Management Planned: Oral ETT and Video Laryngoscope Planned  Additional  Equipment:   Intra-op Plan:   Post-operative Plan: Extubation in OR  Informed Consent: I have reviewed the patients History and Physical, chart, labs and discussed the procedure including the risks, benefits and alternatives for the proposed anesthesia with the patient or authorized representative who has indicated his/her understanding and acceptance.     Dental Advisory Given  Plan Discussed with: Anesthesiologist, CRNA and Surgeon  Anesthesia Plan Comments: (Patient consented for risks of anesthesia including but not limited to:  - adverse reactions to medications - damage to eyes, teeth, lips or other oral mucosa - nerve damage due to positioning  - sore throat or hoarseness - Damage to heart, brain, nerves, lungs, other parts of body or loss of life  Patient voiced understanding.)        Anesthesia Quick Evaluation

## 2020-04-16 NOTE — Transfer of Care (Signed)
Immediate Anesthesia Transfer of Care Note  Patient: Spencer [name]  Procedure(s) Performed: washout of prevertebral abscess, disc biopsy, removal of anterior instrumentation (N/A Neck)  Patient Location: PACU  Anesthesia Type:General  Level of Consciousness: drowsy and patient cooperative  Airway & Oxygen Therapy: Patient Spontanous Breathing and Patient connected to face mask oxygen  Post-op Assessment: Report given to RN and Post -op Vital signs reviewed and stable  Post vital signs: Reviewed and stable  Last Vitals:  Vitals Value Taken Time  BP 110/84 04/16/20 0929  Temp 36.1 C 04/16/20 0924  Pulse 88 04/16/20 0932  Resp 14 04/16/20 0932  SpO2 94 % 04/16/20 0932  Vitals shown include unvalidated device data.  Last Pain:  Vitals:   04/16/20 0924  TempSrc:   PainSc: Asleep      Patients Stated Pain Goal: 0 (04/10/20 0842)  Complications: No complications documented.

## 2020-04-16 NOTE — Progress Notes (Addendum)
Procedure: Debridement and removal of ACDF plate Procedure date: 04/16/2020 Diagnosis: Cervical discitis  History: Spencer [name] is s/p debridement and removal of ACDF plate POD0: Pt evaluated on inpatient unit, awake, alert, oriented. Reports that pain is well controlled.   Physical Exam: Vitals:   04/16/20 1254 04/16/20 1422  BP: 124/90 131/88  Pulse: (!) 101 95  Resp: 16 16  Temp: 98.7 F (37.1 C) 98.6 F (37 C)  SpO2: 99% 100%    General: Alert and oriented, sitting in bed, aspen collar in place.  Strength: Unchanged from prior examination. No new deficits noted.  Side Biceps Triceps Deltoid Interossei Grip Wrist Ext. Wrist Flex.  R 5 5 5 5 5 5 5   L 4- 5 3 5 5 5 5    Side Iliopsoas Quads Hamstring PF DF EHL  R 5 5 5 5 5 5   L 5 5 5 5 5 5    Sensation: intact and symmetric throughout extremities Skin: Incision to anterior neck is clean, dry, intact, no evidence of hematoma noted.   Data:  Recent Labs  Lab 04/13/20 0400  NA 142  K 3.7  CL 110  CO2 26  BUN 33*  CREATININE 0.79  GLUCOSE 213*  CALCIUM 8.3*   No results for input(s): AST, ALT, ALKPHOS in the last 168 hours.  Invalid input(s): TBILI   Recent Labs  Lab 04/13/20 0400  WBC 8.4  HGB 10.3*  HCT 32.6*  PLT 349   No results for input(s): APTT, INR in the last 168 hours.      Assessment/Plan:  is POD0 s/p Debridement and removal of ACDF plate. Will continue to monitor.  - monitor drain output, will maintain drain in place until further notice - mobilize prn, no activity restrictions from a neurosurgical standpoint - pain control per primary team - Can progress diet however may need to maintain soft foods if having difficulty swallowing - PLEASE HOLD Lovenox until POD1 - Maintain ASPEN collar at all times  , NP Department of Neurosurgery

## 2020-04-16 NOTE — Anesthesia Procedure Notes (Addendum)
Procedure Name: Intubation Date/Time: 04/16/2020 7:44 AM Performed by: Omer Jack, CRNA Pre-anesthesia Checklist: Patient identified, Patient being monitored, Timeout performed, Emergency Drugs available and Suction available Patient Re-evaluated:Patient Re-evaluated prior to induction Oxygen Delivery Method: Circle system utilized Preoxygenation: Pre-oxygenation with 100% oxygen Induction Type: IV induction Ventilation: Mask ventilation without difficulty Laryngoscope Size: McGraph and 4 Grade View: Grade I Tube type: Oral Tube size: 7.5 mm Number of attempts: 1 Airway Equipment and Method: Stylet Placement Confirmation: ETT inserted through vocal cords under direct vision,  positive ETCO2 and breath sounds checked- equal and bilateral Secured at: 23 cm Tube secured with: Tape Dental Injury: Teeth and Oropharynx as per pre-operative assessment  Comments: Pt intubated with c-collar in place

## 2020-04-17 ENCOUNTER — Encounter: Payer: Self-pay | Admitting: Neurosurgery

## 2020-04-17 LAB — CULTURE, BLOOD (ROUTINE X 2): Special Requests: ADEQUATE

## 2020-04-17 LAB — BASIC METABOLIC PANEL
Anion gap: 8 (ref 5–15)
BUN: 27 mg/dL — ABNORMAL HIGH (ref 8–23)
CO2: 26 mmol/L (ref 22–32)
Calcium: 8.4 mg/dL — ABNORMAL LOW (ref 8.9–10.3)
Chloride: 108 mmol/L (ref 98–111)
Creatinine, Ser: 0.85 mg/dL (ref 0.61–1.24)
GFR calc Af Amer: >60 mL/min (ref >60)
GFR calc non Af Amer: >60 mL/min (ref >60)
Glucose, Bld: 139 mg/dL — ABNORMAL HIGH (ref 70–99)
Potassium: 3.5 mmol/L (ref 3.5–5.1)
Sodium: 142 mmol/L (ref 135–145)

## 2020-04-17 LAB — CBC
HCT: 33.4 % — ABNORMAL LOW (ref 39.0–52.0)
Hemoglobin: 10.6 g/dL — ABNORMAL LOW (ref 13.0–17.0)
MCH: 30.2 pg (ref 26.0–34.0)
MCHC: 31.7 g/dL (ref 30.0–36.0)
MCV: 95.2 fL (ref 80.0–100.0)
Platelets: 323 10*3/uL (ref 150–400)
RBC: 3.51 MIL/uL — ABNORMAL LOW (ref 4.22–5.81)
RDW: 16.5 % — ABNORMAL HIGH (ref 11.5–15.5)
WBC: 11.8 10*3/uL — ABNORMAL HIGH (ref 4.0–10.5)
nRBC: 0 % (ref 0.0–0.2)

## 2020-04-17 LAB — GLUCOSE, CAPILLARY
Glucose-Capillary: 102 mg/dL — ABNORMAL HIGH (ref 70–99)
Glucose-Capillary: 150 mg/dL — ABNORMAL HIGH (ref 70–99)
Glucose-Capillary: 139 mg/dL — ABNORMAL HIGH (ref 70–99)
Glucose-Capillary: 176 mg/dL — ABNORMAL HIGH (ref 70–99)

## 2020-04-17 NOTE — Progress Notes (Signed)
Procedure: Debridement and removal of ACDF plate Procedure date: 04/16/2020 Diagnosis: Cervical discitis  History: Spencer [name] is s/p debridement and removal of ACDF plate  POD1: No events overnight.  He is comfortable.  Not wearing brace when I entered.  I replaced brace.  No new deficits.  POD0: Pt evaluated on inpatient unit, awake, alert, oriented. Reports that pain is well controlled.   Physical Exam: Vitals:   04/17/20 0005 04/17/20 0429  BP: (!) 160/96 (!) 173/98  Pulse: 90 86  Resp: 18 18  Temp: 97.9 F (36.6 C) 98.3 F (36.8 C)  SpO2: 98% 97%    General: Alert and oriented, sitting in bed, aspen collar in place.  Strength: Unchanged from prior examination. No new deficits noted.  Side Biceps Triceps Deltoid Interossei Grip Wrist Ext. Wrist Flex.  R 5 5 5 5 5 5 5   L 4 5 4- 5 5 5 5    Side Iliopsoas Quads Hamstring PF DF EHL  R 5 5 5 5 5 5   L 5 5 5 5 5 5    Sensation: intact and symmetric throughout extremities Skin: Incision to anterior neck is clean, dry, intact, no evidence of hematoma noted.   Drain - approximately 1/3 full. No output charted.  Data:  Recent Labs  Lab 04/13/20 0400 04/13/20 0400 04/17/20 0415  NA 142   < > 142  K 3.7   < > 3.5  CL 110   < > 108  CO2 26   < > 26  BUN 33*   < > 27*  CREATININE 0.79   < > 0.85  GLUCOSE 213*   < > 139*  CALCIUM 8.3*  --  8.4*   < > = values in this interval not displayed.   No results for input(s): AST, ALT, ALKPHOS in the last 168 hours.  Invalid input(s): TBILI   Recent Labs  Lab 04/13/20 0400 04/13/20 0400 04/17/20 0415  WBC 8.4   < > 11.8*  HGB 10.3*   < > 10.6*  HCT 32.6*   < > 33.4*  PLT 349  --  323   < > = values in this interval not displayed.   No results for input(s): APTT, INR in the last 168 hours.      Cx from 04/16/20 - NGTDx2  Assessment/Plan:  06/13/20 is POD1 s/p Debridement and removal of ACDF plate. Will continue to monitor.  - monitor drain output, will  maintain drain in place until further notice - mobilize prn with brace - pain control per primary team - Can progress diet however may need to maintain soft foods if having difficulty swallowing - OK for lovenox - Maintain ASPEN collar at all times. I have reinforced this with the patient.  He has had difficulty maintaining appropriate brace placement.  I am not confident that he understands the importance of the brace.  06/13/20 MD Department of Neurosurgery

## 2020-04-17 NOTE — Progress Notes (Signed)
PROGRESS NOTE    Spencer [name]   WHQ:[id number]  DOB: 22-Feb-1961  PCP: No primary care provider on file.    DOA: 03/13/2020 LOS: 66   Brief Narrative   59 year old male with history of uncontrolled type 2 diabetes mellitus, cocaine use, brain aneurysm, hypertension, GERD recently hospitalized on 4/28 for DKA, MSSA bacteremia and AKI but signed out AMA on 5/5 was brought by EMS with hyperglycemia and encephalopathy.  In the ED was found to be in DKA with high anion gap of 30.  Given aggressive IV hydration and insulin drip with resolution of DKA.  Hospital course prolonged due to presence of recurrent MSSA bacteremia.    DKA resolved.. The patient will need to complete 6 weeks of IV nafcillin prior to discharge per ID.  Last day of therapy would be 04/26/2020.   On the evening of 04/03/2020 the patient developed acute flash pulmonary edema. He was transferred to the ICU and given diuresis and placed on BIPAP.  He had similar presentation on 5/27 requiring transfer to ICU.    Assessment & Plan   Principal Problem:   MSSA bacteremia Active Problems:   DKA (diabetic ketoacidoses) (HCC)   Hypertension   Diabetic neuropathy (HCC)   DM2 (diabetes mellitus, type 2) (HCC)   History of cocaine abuse (HCC)   Personal history of subdural hematoma   AKI (acute kidney injury) (HCC)   GERD (gastroesophageal reflux disease)   C4 cervical fracture (HCC)   Cigarette smoker   Pressure injury of skin   MSSA bacteremia, persistent Patient left AMA during last hospital stay.  Blood culture positive on 4/29 and (5/8) positive for MSSA.  2D echo unremarkable.  Repeat blood culture on 5/10 and 5/27, negative. --Patient is on nafcillin and rifampin.  Per ID he will need at least 4 weeks of IV antibiotics from 6/11 depending on the culture result  --Needs to be treated for full course of antibiotic in the hospital. -Prednisone taper will be completed today on 6/12. considering worsening left upper  extremity symptoms he underwent MRI of neck which showed fracture of C1 as well and osteomyelitis at C4-C5 and T2-T3 compared with previous MRI. He has progressed in spite of appropriate antibiotic for the staph aureus, after discussion with ID and neurosurgery on 6/9, s/p washout and removal of implant on 6/11 - pain meds prn (oxycodoen 10 mg Q 4 hrs prn)  Acute pulmonary edema - resolved Unclear etiology.  2D echo from 5/8 with normal EF and no valvular abnormality.  Patient refused TEE.  Repeated 2D echo on 6/1 which did not show any new valvular issues, EF 55-60%.   --Monitor strict I's/O.   --As needed IV Lasix.    Minimally displaced C4 fracture and now new displaced fractures at C1-2 Recommendation to wear Aspen collar at all times but patient refusing.   D/w Patsey Berthold, NP). Dr Marcell Barlow to evaluate the patient today (6/9) Left upper extremity pain and weakness - progressive weakness of the left arm, unable to raise against gravity.   -CT of the cervical spine along with MRI was performed 6/9 showing new displaced fractures at C1-2, with lateral displacement of the lateral masses of C1 relative to C2 [facility] fracture) and a fracture of the tip of the odontoid process. Progressive changes of diskitis and osteomyelitis at C4-5 and T2-3.  --continue low dose gabapentin   Cervical and thoracic spine osteomyelitis Seen on MRI of the cervical spine from 5/27 (done with concern for myositis  on MRI of the left shoulder).  Showed progressive perivertebral soft tissue swelling with concern for osteomyelitis of C1-C5, T2 and T3 vertebral bodies.  Also has ventral epidural phlegmon extending from C2-C5 and dorsal epidural phlegmon extending from C3-C4-6 6-C7, without discrete epidural abscess, high-grade stenosis or cord signal abnormality.  Also has an acute to subacute mild T3 superior endplate compression fracture. --ID recommends total 4 weeks off IV antibiotic from 6/11 depending on the  culture result   - s/p Anterior cervical washout and debridement with removal of Anterior cervical instrumentation at C5-7 & C4-5 disc biopsy by Neurosurgery on 6/11  Diabetes mellitus type 2, poorly controlled with hyper and hypoglycemia A1c of 12.6.  Presented with recurrent DKA which has resolved.  Currently on Lantus with Premeal aspart and sliding scale coverage.  Needs close CBG monitoring.  Essential hypertension Stable.  Continue amlodipine and losartan.  Increased losartan to 100 mg as BP uncontrolled.  PRN hydralazine.  Monitor.  Ongoing cocaine use Urine drug screen positive this admission.  He reportedly was getting agitated past 2 days and per sister he was supposed to receive paycheck with which he could buy some drugs.  On as needed clonazepam and Haldol.  Acute kidney injury Prerenal secondary to DKA.  Resolved.  Issues with decision-making capacity Seen by psychiatry multiple times this admission and has capacity.  Cannot be involuntarily committed if he decides to leave AMA.  Depression/Anxiety Continue Klonopin BID, Cymbalta  GERD Continue Pepcid     Patient BMI: Body mass index is 22.24 kg/m.   DVT prophylaxis: Lovenox  Diet:  Diet Orders (From admission, onward)    Start     Ordered   04/16/20 2042  DIET SOFT Room service appropriate? Yes; Fluid consistency: Thin  Diet effective now       Question Answer Comment  Room service appropriate? Yes   Fluid consistency: Thin      04/16/20 2041            Code Status: Full Code    Subjective 04/17/20   Eating his breakfast.  No cervical collar on when I saw him.  No new complaints Disposition Plan & Communication   Status is: Inpatient  Remains inpatient appropriate because:IV treatments appropriate due to intensity of illness or inability to take PO   Dispo: The patient is from: Home              Anticipated d/c is to: Home              Anticipated d/c date is: > 3 days (planned final day  abx 6/21)              Patient currently is not medically stable to d/c.needs to finish IV Abx while inpt. ID recommends total 4 weeks off IV antibiotic from 6/11 depending on the culture result    Family Communication: Updated sister and father at bedside on 6/10.  Discussed with patient today   Consults, Procedures, Significant Events   Consultants:   Neurosurgery  Infectious Disease  Cardiology  Procedures:   MRI Cervical Spine  2D Echo  Antimicrobials:   IV Nafcillin  5/21 >>    Objective   Vitals:   04/16/20 1422 04/16/20 1951 04/17/20 0005 04/17/20 0429  BP: 131/88 (!) 155/94 (!) 160/96 (!) 173/98  Pulse: 95 93 90 86  Resp: 16 18 18 18   Temp: 98.6 F (37 C) 98 F (36.7 C) 97.9 F (36.6 C) 98.3 F (36.8 C)  TempSrc: Oral Oral Oral Oral  SpO2: 100% 98% 98% 97%  Weight:      Height:        Intake/Output Summary (Last 24 hours) at 04/17/2020 1054 Last data filed at 04/17/2020 1015 Gross per 24 hour  Intake 2442.22 ml  Output 3950 ml  Net -1507.78 ml   Filed Weights   03/13/20 1654 03/13/20 2055 04/01/20 2230  Weight: 59 kg 60.3 kg 70.3 kg    Physical Exam:  General exam: awake, alert, no acute distress, disheveled appearing Respiratory system: CTAB, no wheezes, rales or rhonchi, normal respiratory effort. Cardiovascular system: normal S1/S2, RRR, no pedal edema.   Neurologic: left upper extremity weak, unable to keep raised against gravity, intact grip strength.  Other extremities with motor intact.  No spasticity noted. Extremities: pain with passive ROM in left shoulder flexion and abduction, no peripheral edema   Labs   Data Reviewed: I have personally reviewed following labs and imaging studies  CBC: Recent Labs  Lab 04/13/20 0400 04/17/20 0415  WBC 8.4 11.8*  HGB 10.3* 10.6*  HCT 32.6* 33.4*  MCV 95.0 95.2  PLT 349 323   Basic Metabolic Panel: Recent Labs  Lab 04/13/20 0400 04/17/20 0415  NA 142 142  K 3.7 3.5  CL 110 108    CO2 26 26  GLUCOSE 213* 139*  BUN 33* 27*  CREATININE 0.79 0.85  CALCIUM 8.3* 8.4*   GFR: Estimated Creatinine Clearance: 90.7 mL/min (by C-G formula based on SCr of 0.85 mg/dL). Liver Function Tests: No results for input(s): AST, ALT, ALKPHOS, BILITOT, PROT, ALBUMIN in the last 168 hours. No results for input(s): LIPASE, AMYLASE in the last 168 hours. No results for input(s): AMMONIA in the last 168 hours. Coagulation Profile: No results for input(s): INR, PROTIME in the last 168 hours. Cardiac Enzymes: No results for input(s): CKTOTAL, CKMB, CKMBINDEX, TROPONINI in the last 168 hours. BNP (last 3 results) No results for input(s): PROBNP in the last 8760 hours. HbA1C: No results for input(s): HGBA1C in the last 72 hours. CBG: Recent Labs  Lab 04/16/20 0927 04/16/20 1132 04/16/20 1637 04/16/20 2213 04/17/20 0745  GLUCAP 246* 171* 249* 332* 102*   Lipid Profile: No results for input(s): CHOL, HDL, LDLCALC, TRIG, CHOLHDL, LDLDIRECT in the last 72 hours. Thyroid Function Tests: No results for input(s): TSH, T4TOTAL, FREET4, T3FREE, THYROIDAB in the last 72 hours. Anemia Panel: No results for input(s): VITAMINB12, FOLATE, FERRITIN, TIBC, IRON, RETICCTPCT in the last 72 hours. Sepsis Labs: No results for input(s): PROCALCITON, LATICACIDVEN in the last 168 hours.  Recent Results (from the past 240 hour(s))  CULTURE, BLOOD (ROUTINE X 2) w Reflex to ID Panel     Status: None (Preliminary result)   Collection Time: 04/14/20  9:12 PM   Specimen: Left Antecubital; Blood  Result Value Ref Range Status   Specimen Description LEFT ANTECUBITAL  Final   Special Requests   Final    BOTTLES DRAWN AEROBIC AND ANAEROBIC Blood Culture adequate volume   Culture   Final    NO GROWTH 3 DAYS Performed at [facility], [address] [place], [place] [address]    Report Status PENDING  Incomplete  CULTURE, BLOOD (ROUTINE X 2) w Reflex to ID Panel     Status: Abnormal    Collection Time: 04/14/20  9:22 PM   Specimen: BLOOD  Result Value Ref Range Status   Specimen Description   Final    BLOOD BLOOD LEFT HAND Performed at [employer]  [facility], [address] [place], [place] [address]    Special Requests   Final    BOTTLES DRAWN AEROBIC AND ANAEROBIC Blood Culture adequate volume Performed at [facility], [address] [place], [place] [address]    Culture  Setup Time   Final    GRAM POSITIVE COCCI AEROBIC BOTTLE ONLY CRITICAL RESULT CALLED TO, READ BACK BY AND VERIFIED WITH: JASON ROBBINS AT 1808 04/15/20 BY ACR.PMF    Culture (A)  Final    STAPHYLOCOCCUS SPECIES (COAGULASE NEGATIVE) THE SIGNIFICANCE OF ISOLATING THIS ORGANISM FROM A SINGLE SET OF BLOOD CULTURES WHEN MULTIPLE SETS ARE DRAWN IS UNCERTAIN. PLEASE NOTIFY THE MICROBIOLOGY DEPARTMENT WITHIN ONE WEEK IF SPECIATION AND SENSITIVITIES ARE REQUIRED. Performed at [facility], [address], [place], [place] [address]    Report Status 04/17/2020 FINAL  Final  Blood Culture ID Panel (Reflexed)     Status: Abnormal   Collection Time: 04/14/20  9:23 PM  Result Value Ref Range Status   Enterococcus species NOT DETECTED NOT DETECTED Final   Listeria monocytogenes NOT DETECTED NOT DETECTED Final   Staphylococcus species DETECTED (A) NOT DETECTED Final    Comment: Methicillin (oxacillin) resistant coagulase negative staphylococcus. Possible blood culture contaminant (unless isolated from more than one blood culture draw or clinical case suggests pathogenicity). No antibiotic treatment is indicated for blood  culture contaminants. CRITICAL RESULT CALLED TO, READ BACK BY AND VERIFIED WITH: JASON ROBBINS 04/15/20 @1808  BY ACR    Staphylococcus aureus (BCID) NOT DETECTED NOT DETECTED Final   Methicillin resistance DETECTED (A) NOT DETECTED Final    Comment: CRITICAL RESULT CALLED TO, READ BACK BY AND VERIFIED WITH: JASON ROBBINS 04/15/20 @1808  BY ACR    Streptococcus species NOT  DETECTED NOT DETECTED Final   Streptococcus agalactiae NOT DETECTED NOT DETECTED Final   Streptococcus pneumoniae NOT DETECTED NOT DETECTED Final   Streptococcus pyogenes NOT DETECTED NOT DETECTED Final   Acinetobacter baumannii NOT DETECTED NOT DETECTED Final   Enterobacteriaceae species NOT DETECTED NOT DETECTED Final   Enterobacter cloacae complex NOT DETECTED NOT DETECTED Final   Escherichia coli NOT DETECTED NOT DETECTED Final   Klebsiella oxytoca NOT DETECTED NOT DETECTED Final   Klebsiella pneumoniae NOT DETECTED NOT DETECTED Final   Proteus species NOT DETECTED NOT DETECTED Final   Serratia marcescens NOT DETECTED NOT DETECTED Final   Haemophilus influenzae NOT DETECTED NOT DETECTED Final   Neisseria meningitidis NOT DETECTED NOT DETECTED Final   Pseudomonas aeruginosa NOT DETECTED NOT DETECTED Final   Candida albicans NOT DETECTED NOT DETECTED Final   Candida glabrata NOT DETECTED NOT DETECTED Final   Candida krusei NOT DETECTED NOT DETECTED Final   Candida parapsilosis NOT DETECTED NOT DETECTED Final   Candida tropicalis NOT DETECTED NOT DETECTED Final    Comment: Performed at [facility], [address] [place], [address] [place]  Aerobic/Anaerobic Culture (surgical/deep wound)     Status: None (Preliminary result)   Collection Time: 04/16/20  8:22 AM   Specimen: PATH Cytology Misc. fluid; Body Fluid  Result Value Ref Range Status   Specimen Description   Final    WOUND Performed at [facility], [address] [place], [address] [place]    Special Requests   Final    NONE Performed at [facility], [address] [place], [address] [place]    Gram Stain NO WBC SEEN NO ORGANISMS SEEN   Final   Culture   Final    NO  GROWTH < 24 HOURS Performed at [facility], [address] [place], [place] [address]    Report Status PENDING  Incomplete  Aerobic/Anaerobic Culture (surgical/deep wound)     Status: None (Preliminary result)    Collection Time: 04/16/20  8:22 AM   Specimen: PATH Cytology Misc. fluid; Body Fluid  Result Value Ref Range Status   Specimen Description   Final    WOUND Performed at [facility], [address] [place], [place] [address]    Special Requests   Final    NONE Performed at [facility], [address] [place], [place] [address]    Gram Stain NO WBC SEEN NO ORGANISMS SEEN   Final   Culture   Final    NO GROWTH < 24 HOURS Performed at [facility], [address] [place], [place] [address]    Report Status PENDING  Incomplete  Aerobic/Anaerobic Culture (surgical/deep wound)     Status: None (Preliminary result)   Collection Time: 04/16/20  8:22 AM   Specimen: PATH Bone biopsy; Tissue  Result Value Ref Range Status   Specimen Description   Final    BONE Performed at [facility], [address] [place], [place] [address]    Special Requests   Final    NONE Performed at [facility], [address] [place], [place] [address]    Gram Stain NO WBC SEEN NO ORGANISMS SEEN   Final   Culture   Final    NO GROWTH < 24 HOURS Performed at [facility], [address] [place], [place] [address]    Report Status PENDING  Incomplete      Imaging Studies   No results found.   Medications   Scheduled Meds: . amLODipine  10 mg Oral Daily  . aspirin EC  81 mg Oral Daily  . chlorhexidine  15 mL Mouth Rinse BID  . Chlorhexidine Gluconate Cloth  6 each Topical Q0600  . clonazePAM  0.5 mg Oral BID  . DULoxetine  30 mg Oral Daily  . famotidine  20 mg Oral Daily  . feeding supplement (GLUCERNA SHAKE)  237 mL Oral BID BM  . furosemide  40 mg Oral BID  . gabapentin  300 mg Oral BID  . haloperidol  0.25 mg Oral BID  . insulin aspart  0-5 Units Subcutaneous QHS  . insulin aspart  0-9 Units Subcutaneous TID WC  . insulin aspart  12 Units Subcutaneous TID WC  . insulin glargine  32 Units Subcutaneous Daily  . losartan  100 mg  Oral Daily  . mouth rinse  15 mL Mouth Rinse q12n4p  . methocarbamol  500 mg Oral TID  . multivitamin with minerals  1 tablet Oral Daily  . OLANZapine  5 mg Oral QHS  . polyethylene glycol  17 g Oral Daily  . rifampin  300 mg Oral Q12H   Continuous Infusions: . sodium chloride 20.8 mL/hr at 04/01/20 0938  . sodium chloride 75 mL/hr at 04/17/20 0406  . nafcillin (NAFCIL) continuous infusion 20.8 mL/hr at 04/17/20 0406       LOS: 35 days    Time spent: 15 minutes    Malissa Slay Sherryll BurgerShah, DO [facility]  04/17/2020, 10:54 AM    If 7PM-7AM, please contact night-coverage. How to contact the [facility] Attending or Consulting provider 7A - 7P or covering provider during after hours 7P -7A, for this patient?    1. Check the care  team in [facility] and look for a) attending/consulting [place] provider listed and b) the [facility] team listed 2. Log into [email or web address] and use [place]'s universal password to access. If you do not have the password, please contact the hospital operator. 3. Locate the [facility] provider you are looking for under [facility] and page to a number that you can be directly reached. 4. If you still have difficulty reaching the provider, please page the [facility] (Director on Call) for the Hospitalists listed on amion for assistance.

## 2020-04-18 LAB — BASIC METABOLIC PANEL
Anion gap: 7 (ref 5–15)
BUN: 23 mg/dL (ref 8–23)
CO2: 28 mmol/L (ref 22–32)
Calcium: 8.1 mg/dL — ABNORMAL LOW (ref 8.9–10.3)
Chloride: 105 mmol/L (ref 98–111)
Creatinine, Ser: 0.7 mg/dL (ref 0.61–1.24)
GFR calc Af Amer: >60 mL/min (ref >60)
GFR calc non Af Amer: >60 mL/min (ref >60)
Glucose, Bld: 116 mg/dL — ABNORMAL HIGH (ref 70–99)
Potassium: 3.6 mmol/L (ref 3.5–5.1)
Sodium: 140 mmol/L (ref 135–145)

## 2020-04-18 LAB — BPAM PLATELET PHERESIS
Blood Product Expiration Date: 202106132359
Unit Type and Rh: 5100

## 2020-04-18 LAB — CBC
HCT: 31.2 % — ABNORMAL LOW (ref 39.0–52.0)
Hemoglobin: 10.4 g/dL — ABNORMAL LOW (ref 13.0–17.0)
MCH: 31 pg (ref 26.0–34.0)
MCHC: 33.3 g/dL (ref 30.0–36.0)
MCV: 93.1 fL (ref 80.0–100.0)
Platelets: 283 10*3/uL (ref 150–400)
RBC: 3.35 MIL/uL — ABNORMAL LOW (ref 4.22–5.81)
RDW: 16.8 % — ABNORMAL HIGH (ref 11.5–15.5)
WBC: 9.3 10*3/uL (ref 4.0–10.5)
nRBC: 0 % (ref 0.0–0.2)

## 2020-04-18 LAB — GLUCOSE, CAPILLARY
Glucose-Capillary: 82 mg/dL (ref 70–99)
Glucose-Capillary: 139 mg/dL — ABNORMAL HIGH (ref 70–99)
Glucose-Capillary: 118 mg/dL — ABNORMAL HIGH (ref 70–99)
Glucose-Capillary: 231 mg/dL — ABNORMAL HIGH (ref 70–99)

## 2020-04-18 LAB — PREPARE PLATELET PHERESIS: Unit division: 0

## 2020-04-18 MED ORDER — CLONAZEPAM 1 MG PO TABS
0.5000 mg | ORAL_TABLET | Freq: Two times a day (BID) | ORAL | Status: DC
  Administered 2020-04-18 – 2020-04-28 (×21): 0.5 mg via ORAL
  Filled 2020-04-18 (×21): qty 1

## 2020-04-18 NOTE — TOC Progression Note (Addendum)
Transition of Care [facility]) - Progression Note    Patient Details  Name: Spencer [name] MRN: [id number] Date of Birth: 02-Jun-1961  [facility] CM/SW Contact  Eilleen Kempf, LCSW Phone Number: 04/18/2020, 12:37 PM  Clinical Narrative:    Dr. Sherryll Burger requested finding SNF placement for this patient today. Due to patients insurance and requiring several more weeks of IV antibiotics, the only place I could find to even consider this patient is [facility]. I completed Fl2 and sent information to [facility]. I also shared this information with Dr. Sherryll Burger. Tresa Endo did state the patient will need to be long term, I spoke with patient and his sister. Patient is fine with SNF being long term. Now awaiting a decision from [facility].  4:25 PM PASRR#  [id number]    Expected Discharge Plan: Home/Self Care Barriers to Discharge: Continued Medical Work up  Expected Discharge Plan and Services Expected Discharge Plan: Home/Self Care In-house Referral: Clinical Social Work Discharge Planning Services: CM Consult   Living arrangements for the past 2 months: Single Family Home                 DME Arranged: Walker rolling DME Agency: [employer] Date DME Agency Contacted: 03/18/20 Time DME Agency Contacted: 1100 Representative spoke with at DME Agency: Zack             Social Determinants of Health (SDOH) Interventions    Readmission Risk Interventions Readmission Risk Prevention Plan 04/07/2020 03/05/2020 02/16/2020  Transportation Screening Complete Complete Complete  PCP or Specialist Appt within 3-5 Days - - -  HRI or Home Care Consult - - -  HRI or Home Care Consult comments - - -  Palliative Care Screening - - -  Medication Review (RN Care Manager) Complete Complete Complete  PCP or Specialist appointment within 3-5 days of discharge - Patient refused Complete  HRI or Home Care Consult - Complete Complete  SW Recovery Care/Counseling Consult - - -  SW Consult Not Complete Comments - - -   Palliative Care Screening Not Applicable - -  Skilled Nursing Facility Not Applicable Complete Complete  Some recent data might be hidden

## 2020-04-18 NOTE — [place] FL2 (Signed)
[place] MEDICAID FL2 LEVEL OF CARE SCREENING TOOL     IDENTIFICATION  Patient Name: Spencer [name] Birthdate: 1961-03-16 Sex: male Admission Date (Current Location): 03/13/2020  [place] and [place] Number:  Chiropodist and Address:  [facility], [address], [place], [place] [address]      Provider Number:    Attending Physician Name and Address:  Delfino Lovett, MD  Relative Name and Phone Number:  Barbaraann Rondo (Sister) [phone]    Current Level of Care: Hospital Recommended Level of Care: Skilled Nursing Facility Prior Approval Number:    Date Approved/Denied:   PASRR Number:    Discharge Plan: SNF    Current Diagnoses: Patient Active Problem List   Diagnosis Date Noted  . MSSA bacteremia 03/14/2020  . Cigarette smoker 03/14/2020  . Pressure injury of skin 03/14/2020  . C4 cervical fracture (HCC) 03/05/2020  . Atrial fibrillation with RVR (HCC) 08/07/2019  . GERD (gastroesophageal reflux disease) 08/07/2019  . AKI (acute kidney injury) (HCC) 02/19/2019  . Diabetic neuropathy (HCC) 01/16/2017  . DM2 (diabetes mellitus, type 2) (HCC) 01/16/2017  . History of cocaine abuse (HCC) 01/16/2017  . Personal history of subdural hematoma 01/16/2017  . Protein-calorie malnutrition, severe 11/08/2016  . DKA (diabetic ketoacidoses) (HCC) 04/03/2015  . Hypertension 04/03/2015  . Depression 04/03/2015  . Opiate dependence (HCC) 04/03/2015  . Type II or unspecified type diabetes mellitus without mention of complication, not stated as uncontrolled 07/19/2014  . Hernia of flank 09/20/2012    Orientation RESPIRATION BLADDER Height & Weight     Self, Situation    Continent Weight: 154 lb 15.7 oz (70.3 kg) Height:  5\' 10"  (177.8 cm)  BEHAVIORAL SYMPTOMS/MOOD NEUROLOGICAL BOWEL NUTRITION STATUS      Continent    AMBULATORY STATUS COMMUNICATION OF NEEDS Skin   Extensive Assist Verbally                         Personal Care  Assistance Level of Assistance  Bathing, Feeding, Dressing Bathing Assistance: Limited assistance Feeding assistance: Limited assistance Dressing Assistance: Maximum assistance     Functional Limitations Info             SPECIAL CARE FACTORS FREQUENCY  PT (By licensed PT), OT (By licensed OT), Diabetic urine testing     PT Frequency: 5X weekly OT Frequency: 5X weekly            Contractures Contractures Info: Not present    Additional Factors Info  Code Status Code Status Info: Full             Current Medications (04/18/2020):  This is the current hospital active medication list Current Facility-Administered Medications  Medication Dose Route Frequency Provider Last Rate Last Admin  . 0.9 %  sodium chloride infusion   Intravenous PRN 04/20/2020 B, MD 20.8 mL/hr at 04/01/20 0938 Rate Change at 04/01/20 0938  . 0.9 %  sodium chloride infusion   Intravenous Continuous Zdeb, Christine, NP 75 mL/hr at 04/18/20 0111 New Bag at 04/18/20 0111  . acetaminophen (TYLENOL) tablet 650 mg  650 mg Oral Q6H PRN 04/20/20 B, MD   650 mg at 04/07/20 1229  . amLODipine (NORVASC) tablet 10 mg  10 mg Oral Daily Rawla, Prashanth, MD   10 mg at 04/18/20 1042  . aspirin EC tablet 81 mg  81 mg Oral Daily Rawla, Prashanth, MD   81 mg at 04/18/20 1042  . bisacodyl (  DULCOLAX) EC tablet 5 mg  5 mg Oral Daily PRN Gillis Santa, MD      . bisacodyl (DULCOLAX) suppository 10 mg  10 mg Rectal Daily PRN Gillis Santa, MD      . chlorhexidine (PERIDEX) 0.12 % solution 15 mL  15 mL Mouth Rinse BID Jimmye Norman, NP   15 mL at 04/18/20 1043  . Chlorhexidine Gluconate Cloth 2 % PADS 6 each  6 each Topical Q0600 Delfino Lovett, MD   6 each at 04/17/20 0532  . clonazePAM (KLONOPIN) tablet 0.5 mg  0.5 mg Oral BID Swayze, Ava, DO   0.5 mg at 04/18/20 1042  . dextrose 50 % solution 0-50 mL  0-50 mL Intravenous PRN Rawla, Prashanth, MD      . DULoxetine (CYMBALTA) DR capsule 30 mg  30 mg  Oral Daily Roselind Messier, MD   30 mg at 04/18/20 1042  . famotidine (PEPCID) tablet 20 mg  20 mg Oral Daily Lolita Patella B, MD   20 mg at 04/17/20 2117  . feeding supplement (GLUCERNA SHAKE) (GLUCERNA SHAKE) liquid 237 mL  237 mL Oral BID BM Charise Killian, MD   237 mL at 04/18/20 1047  . furosemide (LASIX) tablet 40 mg  40 mg Oral BID Dhungel, Nishant, MD   40 mg at 04/18/20 0833  . gabapentin (NEURONTIN) tablet 300 mg  300 mg Oral BID Esaw Grandchild A, DO   300 mg at 04/18/20 1042  . haloperidol (HALDOL) tablet 0.25 mg  0.25 mg Oral BID Roselind Messier, MD   0.25 mg at 04/18/20 1043  . haloperidol (HALDOL) tablet 2 mg  2 mg Oral Q6H PRN Gillis Santa, MD   2 mg at 04/02/20 1646  . hydrALAZINE (APRESOLINE) injection 10 mg  10 mg Intravenous Q6H PRN Charise Killian, MD   10 mg at 04/15/20 0514  . HYDROcodone-acetaminophen (NORCO/VICODIN) 5-325 MG per tablet 1 tablet  1 tablet Oral Q6H PRN Esaw Grandchild A, DO   1 tablet at 04/18/20 0833  . insulin aspart (novoLOG) injection 0-5 Units  0-5 Units Subcutaneous QHS Lolita Patella B, MD   3 Units at 04/16/20 2219  . insulin aspart (novoLOG) injection 0-9 Units  0-9 Units Subcutaneous TID WC Esaw Grandchild A, DO   1 Units at 04/17/20 1710  . insulin aspart (novoLOG) injection 12 Units  12 Units Subcutaneous TID WC Lowella Bandy, RPH   12 Units at 04/18/20 5726  . insulin glargine (LANTUS) injection 32 Units  32 Units Subcutaneous Daily Swayze, Ava, DO   32 Units at 04/18/20 1043  . ipratropium-albuterol (DUONEB) 0.5-2.5 (3) MG/3ML nebulizer solution 3 mL  3 mL Nebulization Q6H PRN Esaw Grandchild A, DO      . losartan (COZAAR) tablet 100 mg  100 mg Oral Daily Esaw Grandchild A, DO   100 mg at 04/18/20 1042  . MEDLINE mouth rinse  15 mL Mouth Rinse q12n4p Jimmye Norman, NP   15 mL at 04/17/20 1711  . methocarbamol (ROBAXIN) tablet 500 mg  500 mg Oral TID Lolita Patella B, MD   500 mg at 04/18/20 1042  . multivitamin  with minerals tablet 1 tablet  1 tablet Oral Daily Sreenath, Sudheer B, MD   1 tablet at 04/18/20 1042  . nafcillin 12 g in sodium chloride 0.9 % 500 mL continuous infusion  12 g Intravenous Q24H Charise Killian, MD 20.8 mL/hr at 04/18/20 1048 12 g at 04/18/20 1048  . OLANZapine (ZYPREXA) tablet  5 mg  5 mg Oral QHS Eulas Post, MD   5 mg at 04/17/20 2117  . polyethylene glycol (MIRALAX / GLYCOLAX) packet 17 g  17 g Oral Daily Val Riles, MD   17 g at 04/18/20 1043  . rifampin (RIFADIN) capsule 300 mg  300 mg Oral Q12H Tsosie Billing, MD   300 mg at 04/18/20 1043  . sodium chloride flush (NS) 0.9 % injection 10-40 mL  10-40 mL Intracatheter PRN Wyvonnia Dusky, MD      . traMADol Veatrice Bourbon) tablet 50 mg  50 mg Oral Q6H PRN Wyvonnia Dusky, MD   50 mg at 04/17/20 2117     Discharge Medications: Please see discharge summary for a list of discharge medications.  Relevant Imaging Results:  Relevant Lab Results:   Additional Information SS# [id number]  Meriel Flavors, LCSW

## 2020-04-18 NOTE — Progress Notes (Signed)
Procedure: Debridement and removal of ACDF plate Procedure date: 04/16/2020 Diagnosis: Cervical discitis  History: Spencer [name] is s/p debridement and removal of ACDF plate  POD2: Seen in bed this morning, not wearing brace, although he reports he took it off to eat. Nursing reports that he is noncompliant with the brace still.  He reports that he feels that his left hand strength is improving. No new deficits to upper/lower extremities. Neck incision remains clean, dry, intact. No evidence of hematoma.  He denies dysphagia.   Drain output per nursing is 48ml over the last 24 hours, possibly since placement in OR (no documentation of drain output since surgery but per nursing they believe this is all it has put out since surgery).  POD1: No events overnight.  He is comfortable.  Not wearing brace when I entered.  I replaced brace.  No new deficits.  POD0: Pt evaluated on inpatient unit, awake, alert, oriented. Reports that pain is well controlled.   Physical Exam: Vitals:   04/17/20 1202 04/18/20 0515  BP: (!) 160/96 (!) 147/93  Pulse: (!) 102 89  Resp: 15 16  Temp: 98.7 F (37.1 C) 98.8 F (37.1 C)  SpO2: 97% 100%    General: Alert and oriented, sitting in bed, eating breakfast  Strength: No new deficits noted.  Side Biceps Triceps Deltoid Interossei Grip Wrist Ext. Wrist Flex.  R 5 5 5 5 5 5 5   L 4 5 4- 5 5 5 5    Side Iliopsoas Quads Hamstring PF DF EHL  R 5 5 5 5 5 5   L 5 5 5 5 5 5    Sensation: intact and symmetric throughout extremities Skin: Incision to anterior neck is clean, dry, intact, no evidence of hematoma or swelling noted.   Drain - per nursing, 56ml out output over last 24 hours. Also per nursing, this may be the only output since surgery.    Data:  Recent Labs  Lab 04/13/20 0400 04/13/20 0400 04/17/20 0415 04/17/20 0415 04/18/20 0544  NA 142   < > 142   < > 140  K 3.7   < > 3.5   < > 3.6  CL 110   < > 108   < > 105  CO2 26   < > 26   < > 28   BUN 33*   < > 27*   < > 23  CREATININE 0.79   < > 0.85   < > 0.70  GLUCOSE 213*   < > 139*   < > 116*  CALCIUM 8.3*  --  8.4*  --  8.1*   < > = values in this interval not displayed.   No results for input(s): AST, ALT, ALKPHOS in the last 168 hours.  Invalid input(s): TBILI   Recent Labs  Lab 04/13/20 0400 04/13/20 0400 04/17/20 0415 04/17/20 0415 04/18/20 0544  WBC 8.4   < > 11.8*   < > 9.3  HGB 10.3*   < > 10.6*   < > 10.4*  HCT 32.6*   < > 33.4*   < > 31.2*  PLT 349  --  323  --  283   < > = values in this interval not displayed.   No results for input(s): APTT, INR in the last 168 hours.      Intraoperative cultures from 04/16/2020: Gram Stain NO WBC SEEN  NO ORGANISMS SEEN   Culture NO GROWTH < 24 HOURS    Assessment/Plan:  06/17/20  Spencer [name] is POD2 s/p Debridement and removal of ACDF plate. Will continue to monitor.  - Drain removed this morning, tolerated this well. No complications.  - mobilize prn with brace - pain control per primary team - Tolerating soft food diet without any difficulty swallowing.  - OK for lovenox - Maintain ASPEN collar at all times. I again reviewed the need to wear the ASPEN collar at all times, and the importance of wearing. Nursing asked to continue to reiterate importance of wearing brace.   Lonell Face, NP Department of Neurosurgery

## 2020-04-18 NOTE — Progress Notes (Signed)
PROGRESS NOTE    Spencer [name]   JAS:[id number]  DOB: [birth year]  PCP: No primary care provider on file.    DOA: 03/13/2020 LOS: 17   Brief Narrative   59 year old male with history of uncontrolled type 2 diabetes mellitus, cocaine use, brain aneurysm, hypertension, GERD recently hospitalized on 4/28 for DKA, MSSA bacteremia and AKI but signed out AMA on 5/5 was brought by EMS with hyperglycemia and encephalopathy.  In the ED was found to be in DKA with high anion gap of 30.  Given aggressive IV hydration and insulin drip with resolution of DKA.  Hospital course prolonged due to presence of recurrent MSSA bacteremia.    DKA resolved.. The patient will need to complete 6 weeks of IV nafcillin prior to discharge per ID.  Last day of therapy would be 04/26/2020.   On the evening of 04/03/2020 the patient developed acute flash pulmonary edema. He was transferred to the ICU and given diuresis and placed on BIPAP.  He had similar presentation on 5/27 requiring transfer to ICU.    Assessment & Plan   Principal Problem:   MSSA bacteremia Active Problems:   DKA (diabetic ketoacidoses) (HCC)   Hypertension   Diabetic neuropathy (HCC)   DM2 (diabetes mellitus, type 2) (HCC)   History of cocaine abuse (HCC)   Personal history of subdural hematoma   AKI (acute kidney injury) (HCC)   GERD (gastroesophageal reflux disease)   C4 cervical fracture (HCC)   Cigarette smoker   Pressure injury of skin   MSSA bacteremia, persistent Patient left AMA during last hospital stay.  Blood culture positive on 4/29 and (5/8) positive for MSSA.  2D echo unremarkable.  Repeat blood culture on 5/10 and 5/27, negative. --Patient is on nafcillin and rifampin.  Per ID he will need at least 4 weeks of IV antibiotics from 6/11 depending on the culture result  --Needs to be treated for full course of antibiotic in the hospital. -Prednisone taper will be completed today on 6/12. considering worsening left upper  extremity symptoms he underwent MRI of neck which showed fracture of C1 as well and osteomyelitis at C4-C5 and T2-T3 compared with previous MRI. He has progressed in spite of appropriate antibiotic for the staph aureus, after discussion with ID and neurosurgery on 6/9, s/p washout and removal of implant on 6/11 - pain meds prn (oxycodone 10 mg Q 4 hrs prn)  Acute pulmonary edema - resolved Unclear etiology.  2D echo from 5/8 with normal EF and no valvular abnormality.  Patient refused TEE.  Repeated 2D echo on 6/1 which did not show any new valvular issues, EF 55-60%.   --Monitor strict I's/O.   --As needed IV Lasix.    Minimally displaced C4 fracture and now new displaced fractures at C1-2 Recommendation to wear Aspen collar at all times but patient does not always wear it  Left upper extremity pain and weakness -weakness has improved. -CT of the cervical spine along with MRI was performed 6/9 showing new displaced fractures at C1-2, with lateral displacement of the lateral masses of C1 relative to C2 [facility] fracture) and a fracture of the tip of the odontoid process. Progressive changes of diskitis and osteomyelitis at C4-5 and T2-3.  --continue low dose gabapentin   Cervical and thoracic spine osteomyelitis Seen on MRI of the cervical spine from 5/27 (done with concern for myositis on MRI of the left shoulder).  Showed progressive perivertebral soft tissue swelling with concern for osteomyelitis of C1-C5, T2  and T3 vertebral bodies.  Also has ventral epidural phlegmon extending from C2-C5 and dorsal epidural phlegmon extending from C3-C4-6 6-C7, without discrete epidural abscess, high-grade stenosis or cord signal abnormality.  Also has an acute to subacute mild T3 superior endplate compression fracture. --ID recommends total 4 weeks off IV antibiotic from 6/11 depending on the culture result   - s/p Anterior cervical washout and debridement with removal of Anterior cervical instrumentation  at C5-7 & C4-5 disc biopsy by Neurosurgery on 6/11 - Drain removed this morning on 6/13 - mobilize prn with brace - Maintain ASPEN collar at all times.  Diabetes mellitus type 2, poorly controlled with hyper and hypoglycemia A1c of 12.6.  Presented with recurrent DKA which has resolved.  Currently on Lantus with Premeal aspart and sliding scale coverage.  Needs close CBG monitoring.  Essential hypertension Stable.  Continue amlodipine and losartan.  Increased losartan to 100 mg as BP uncontrolled.  PRN hydralazine.  Monitor.  Ongoing cocaine use Urine drug screen positive this admission.  He reportedly was getting agitated past 2 days and per sister he was supposed to receive paycheck with which he could buy some drugs.  On as needed clonazepam and Haldol.  Acute kidney injury Prerenal secondary to DKA.  Resolved.  Issues with decision-making capacity Seen by psychiatry multiple times this admission and has capacity.  Cannot be involuntarily committed if he decides to leave AMA.  Depression/Anxiety Continue Klonopin BID, Cymbalta  GERD Continue Pepcid   Patient BMI: Body mass index is 22.24 kg/m.   DVT prophylaxis: Lovenox  Diet:  Diet Orders (From admission, onward)    Start     Ordered   04/16/20 2042  DIET SOFT Room service appropriate? Yes; Fluid consistency: Thin  Diet effective now       Question Answer Comment  Room service appropriate? Yes   Fluid consistency: Thin      04/16/20 2041            Code Status: Full Code    Subjective 04/18/20   No new complaints, waiting to get his drain removed Disposition Plan & Communication   Status is: Inpatient  Remains inpatient appropriate because:IV treatments appropriate due to intensity of illness or inability to take PO   Dispo: The patient is from: Home              Anticipated d/c is to: Home              Anticipated d/c date is: > 3 days (planned final day abx 6/21)              Patient currently is  not medically stable to d/c.needs to finish IV Abx while inpt. ID recommends total 4 weeks off IV antibiotic from 6/11 depending on the culture result.  I have asked TOC (on 6/13) to look into alternative placement like LTAC or local nursing facility who is willing to take her for IV antibiotics   Family Communication: Updated sister and father at bedside on 6/10.  Discussed with patient today   Consults, Procedures, Significant Events   Consultants:   Neurosurgery  Infectious Disease  Cardiology  Procedures:   MRI Cervical Spine  2D Echo  Antimicrobials:   IV Nafcillin  5/21 >>    Objective   Vitals:   04/17/20 0005 04/17/20 0429 04/17/20 1202 04/18/20 0515  BP: (!) 160/96 (!) 173/98 (!) 160/96 (!) 147/93  Pulse: 90 86 (!) 102 89  Resp: 18 18 15  16  Temp: 97.9 F (36.6 C) 98.3 F (36.8 C) 98.7 F (37.1 C) 98.8 F (37.1 C)  TempSrc: Oral Oral Oral Oral  SpO2: 98% 97% 97% 100%  Weight:      Height:        Intake/Output Summary (Last 24 hours) at 04/18/2020 1140 Last data filed at 04/18/2020 1005 Gross per 24 hour  Intake 480 ml  Output 1040 ml  Net -560 ml   Filed Weights   03/13/20 1654 03/13/20 2055 04/01/20 2230  Weight: 59 kg 60.3 kg 70.3 kg    Physical Exam:  General exam: awake, alert, no acute distress, disheveled appearing Respiratory system: CTAB, no wheezes, rales or rhonchi, normal respiratory effort. Cardiovascular system: normal S1/S2, RRR, no pedal edema.   Neurologic: left upper extremity weak, unable to keep raised against gravity, intact grip strength.  Other extremities with motor intact.  No spasticity noted. Extremities: pain with passive ROM in left shoulder flexion and abduction, no peripheral edema   Labs   Data Reviewed: I have personally reviewed following labs and imaging studies  CBC: Recent Labs  Lab 04/13/20 0400 04/17/20 0415 04/18/20 0544  WBC 8.4 11.8* 9.3  HGB 10.3* 10.6* 10.4*  HCT 32.6* 33.4* 31.2*  MCV 95.0  95.2 93.1  PLT 349 323 283   Basic Metabolic Panel: Recent Labs  Lab 04/13/20 0400 04/17/20 0415 04/18/20 0544  NA 142 142 140  K 3.7 3.5 3.6  CL 110 108 105  CO2 26 26 28   GLUCOSE 213* 139* 116*  BUN 33* 27* 23  CREATININE 0.79 0.85 0.70  CALCIUM 8.3* 8.4* 8.1*   GFR: Estimated Creatinine Clearance: 96.4 mL/min (by C-G formula based on SCr of 0.7 mg/dL). Liver Function Tests: No results for input(s): AST, ALT, ALKPHOS, BILITOT, PROT, ALBUMIN in the last 168 hours. No results for input(s): LIPASE, AMYLASE in the last 168 hours. No results for input(s): AMMONIA in the last 168 hours. Coagulation Profile: No results for input(s): INR, PROTIME in the last 168 hours. Cardiac Enzymes: No results for input(s): CKTOTAL, CKMB, CKMBINDEX, TROPONINI in the last 168 hours. BNP (last 3 results) No results for input(s): PROBNP in the last 8760 hours. HbA1C: No results for input(s): HGBA1C in the last 72 hours. CBG: Recent Labs  Lab 04/17/20 0745 04/17/20 1200 04/17/20 1648 04/17/20 2223 04/18/20 0746  GLUCAP 102* 150* 139* 176* 82   Lipid Profile: No results for input(s): CHOL, HDL, LDLCALC, TRIG, CHOLHDL, LDLDIRECT in the last 72 hours. Thyroid Function Tests: No results for input(s): TSH, T4TOTAL, FREET4, T3FREE, THYROIDAB in the last 72 hours. Anemia Panel: No results for input(s): VITAMINB12, FOLATE, FERRITIN, TIBC, IRON, RETICCTPCT in the last 72 hours. Sepsis Labs: No results for input(s): PROCALCITON, LATICACIDVEN in the last 168 hours.  Recent Results (from the past 240 hour(s))  CULTURE, BLOOD (ROUTINE X 2) w Reflex to ID Panel     Status: None (Preliminary result)   Collection Time: 04/14/20  9:12 PM   Specimen: Left Antecubital; Blood  Result Value Ref Range Status   Specimen Description LEFT ANTECUBITAL  Final   Special Requests   Final    BOTTLES DRAWN AEROBIC AND ANAEROBIC Blood Culture adequate volume   Culture   Final    NO GROWTH 4 DAYS Performed at  [facility], [address] [place], [place]    Report Status PENDING  Incomplete  CULTURE, BLOOD (ROUTINE X 2) w Reflex to ID Panel     Status: Abnormal  Collection Time: 04/14/20  9:22 PM   Specimen: BLOOD  Result Value Ref Range Status   Specimen Description   Final    BLOOD BLOOD LEFT HAND Performed at [facility], [address] [place], [place] [address]    Special Requests   Final    BOTTLES DRAWN AEROBIC AND ANAEROBIC Blood Culture adequate volume Performed at [facility], [address] [place], [place] [address]    Culture  Setup Time   Final    GRAM POSITIVE COCCI AEROBIC BOTTLE ONLY CRITICAL RESULT CALLED TO, READ BACK BY AND VERIFIED WITH: JASON ROBBINS AT 1808 04/15/20 BY ACR.PMF    Culture (A)  Final    STAPHYLOCOCCUS SPECIES (COAGULASE NEGATIVE) THE SIGNIFICANCE OF ISOLATING THIS ORGANISM FROM A SINGLE SET OF BLOOD CULTURES WHEN MULTIPLE SETS ARE DRAWN IS UNCERTAIN. PLEASE NOTIFY THE MICROBIOLOGY DEPARTMENT WITHIN ONE WEEK IF SPECIATION AND SENSITIVITIES ARE REQUIRED. Performed at [facility], [address] [place], [place] [address]    Report Status 04/17/2020 FINAL  Final  Blood Culture ID Panel (Reflexed)     Status: Abnormal   Collection Time: 04/14/20  9:23 PM  Result Value Ref Range Status   Enterococcus species NOT DETECTED NOT DETECTED Final   Listeria monocytogenes NOT DETECTED NOT DETECTED Final   Staphylococcus species DETECTED (A) NOT DETECTED Final    Comment: Methicillin (oxacillin) resistant coagulase negative staphylococcus. Possible blood culture contaminant (unless isolated from more than one blood culture draw or clinical case suggests pathogenicity). No antibiotic treatment is indicated for blood  culture contaminants. CRITICAL RESULT CALLED TO, READ BACK BY AND VERIFIED WITH: JASON ROBBINS 04/15/20 @1808  BY ACR    Staphylococcus aureus (BCID) NOT DETECTED NOT DETECTED Final   Methicillin  resistance DETECTED (A) NOT DETECTED Final    Comment: CRITICAL RESULT CALLED TO, READ BACK BY AND VERIFIED WITH: JASON ROBBINS 04/15/20 @1808  BY ACR    Streptococcus species NOT DETECTED NOT DETECTED Final   Streptococcus agalactiae NOT DETECTED NOT DETECTED Final   Streptococcus pneumoniae NOT DETECTED NOT DETECTED Final   Streptococcus pyogenes NOT DETECTED NOT DETECTED Final   Acinetobacter baumannii NOT DETECTED NOT DETECTED Final   Enterobacteriaceae species NOT DETECTED NOT DETECTED Final   Enterobacter cloacae complex NOT DETECTED NOT DETECTED Final   Escherichia coli NOT DETECTED NOT DETECTED Final   Klebsiella oxytoca NOT DETECTED NOT DETECTED Final   Klebsiella pneumoniae NOT DETECTED NOT DETECTED Final   Proteus species NOT DETECTED NOT DETECTED Final   Serratia marcescens NOT DETECTED NOT DETECTED Final   Haemophilus influenzae NOT DETECTED NOT DETECTED Final   Neisseria meningitidis NOT DETECTED NOT DETECTED Final   Pseudomonas aeruginosa NOT DETECTED NOT DETECTED Final   Candida albicans NOT DETECTED NOT DETECTED Final   Candida glabrata NOT DETECTED NOT DETECTED Final   Candida krusei NOT DETECTED NOT DETECTED Final   Candida parapsilosis NOT DETECTED NOT DETECTED Final   Candida tropicalis NOT DETECTED NOT DETECTED Final    Comment: Performed at [facility], [address] [place], [place] [address]  Aerobic/Anaerobic Culture (surgical/deep wound)     Status: None (Preliminary result)   Collection Time: 04/16/20  8:22 AM   Specimen: PATH Cytology Misc. fluid; Body Fluid  Result Value Ref Range Status   Specimen Description   Final    WOUND Performed at [facility], [address] [place], [place] [address]    Special Requests   Final    NONE Performed at [facility], [address] [place], [place] [address]    Gram Stain NO WBC SEEN NO ORGANISMS SEEN   Final   Culture   Final    NO GROWTH < 24 HOURS Performed at [facility], [address] [place], [place] [address]    Report Status PENDING  Incomplete  Aerobic/Anaerobic Culture (surgical/deep wound)     Status: None (Preliminary result)   Collection Time: 04/16/20  8:22 AM   Specimen: PATH Cytology Misc. fluid; Body Fluid  Result Value Ref Range Status   Specimen Description   Final    WOUND Performed at [facility], [address] [place], [place] [address]    Special Requests   Final    NONE Performed at [facility], [address] [place], [place] [address]    Gram Stain NO WBC SEEN NO ORGANISMS SEEN   Final   Culture   Final    NO GROWTH < 24 HOURS Performed at [facility], [address], [place], [place] [address]    Report Status PENDING  Incomplete  Aerobic/Anaerobic Culture (surgical/deep wound)     Status: None (Preliminary result)   Collection Time: 04/16/20  8:22 AM   Specimen: PATH Bone biopsy; Tissue  Result Value Ref Range Status   Specimen Description   Final    BONE Performed at [facility], [address] [place], [place] [address]    Special Requests   Final    NONE Performed at [facility], [address] [place], [place] [address]    Gram Stain NO WBC SEEN NO ORGANISMS SEEN   Final   Culture   Final    NO GROWTH < 24 HOURS Performed at [facility], [address], [place], [place] [address]    Report Status PENDING  Incomplete      Imaging Studies   No results found.   Medications   Scheduled Meds: . amLODipine  10 mg Oral Daily  . aspirin EC  81 mg Oral Daily  . chlorhexidine  15 mL Mouth Rinse BID  . Chlorhexidine Gluconate Cloth  6 each Topical Q0600  . clonazePAM  0.5 mg Oral BID  . DULoxetine  30 mg Oral Daily  . famotidine  20 mg Oral Daily  . feeding supplement (GLUCERNA SHAKE)  237 mL Oral BID BM  . furosemide  40 mg Oral BID  . gabapentin  300 mg Oral BID  . haloperidol  0.25 mg Oral BID  . insulin aspart  0-5 Units  Subcutaneous QHS  . insulin aspart  0-9 Units Subcutaneous TID WC  . insulin aspart  12 Units Subcutaneous TID WC  . insulin glargine  32 Units Subcutaneous Daily  . losartan  100 mg Oral Daily  . mouth rinse  15 mL Mouth Rinse q12n4p  . methocarbamol  500 mg Oral TID  . multivitamin with minerals  1 tablet Oral Daily  . OLANZapine  5 mg Oral QHS  . polyethylene glycol  17 g Oral Daily  . rifampin  300 mg Oral Q12H   Continuous Infusions: . sodium chloride 20.8 mL/hr at 04/01/20 0938  . sodium chloride 75 mL/hr at 04/18/20 0111  . nafcillin (NAFCIL) continuous infusion 12 g (04/18/20 1048)       LOS: 36 days    Time spent: 15 minutes    Retha Bither Sherryll Burger, DO [facility]  04/18/2020, 11:40 AM    If 7PM-7AM, please  contact night-coverage. How to contact the [facility] Attending or Consulting provider [place] or covering provider during after hours [place], for this patient?    1. Check the care team in [facility] and look for a) attending/consulting [facility] provider listed and b) the [facility] team listed 2. Log into [email or web address] and use [facility] universal password to access. If you do not have the password, please contact the hospital operator. 3. Locate the [facility] provider you are looking for under [facility] and page to a number that you can be directly reached. 4. If you still have difficulty reaching the provider, please page the [facility] (Director on Call) for the Hospitalists listed on amion for assistance.

## 2020-04-19 LAB — CULTURE, BLOOD (ROUTINE X 2)
Culture: NO GROWTH
Special Requests: ADEQUATE

## 2020-04-19 LAB — GLUCOSE, CAPILLARY
Glucose-Capillary: 169 mg/dL — ABNORMAL HIGH (ref 70–99)
Glucose-Capillary: 274 mg/dL — ABNORMAL HIGH (ref 70–99)
Glucose-Capillary: 166 mg/dL — ABNORMAL HIGH (ref 70–99)
Glucose-Capillary: 223 mg/dL — ABNORMAL HIGH (ref 70–99)

## 2020-04-19 NOTE — Progress Notes (Signed)
PROGRESS NOTE    Spencer [name]   ZOX:[id number]  DOB: [birth year]  PCP: No primary care provider on file.    DOA: 03/13/2020 LOS: 21   Brief Narrative   59 year old male with history of uncontrolled type 2 diabetes mellitus, cocaine use, brain aneurysm, hypertension, GERD recently hospitalized on 4/28 for DKA, MSSA bacteremia and AKI but signed out AMA on 5/5 was brought by EMS with hyperglycemia and encephalopathy.  In the ED was found to be in DKA with high anion gap of 30.  Given aggressive IV hydration and insulin drip with resolution of DKA.  Hospital course prolonged due to presence of recurrent MSSA bacteremia.    DKA resolved.. The patient will need to complete 6 weeks of IV nafcillin prior to discharge per ID.  Last day of therapy would be 04/26/2020.   On the evening of 04/03/2020 the patient developed acute flash pulmonary edema. He was transferred to the ICU and given diuresis and placed on BIPAP.  He had similar presentation on 5/27 requiring transfer to ICU.    Assessment & Plan   Principal Problem:   MSSA bacteremia Active Problems:   DKA (diabetic ketoacidoses) (HCC)   Hypertension   Diabetic neuropathy (HCC)   DM2 (diabetes mellitus, type 2) (HCC)   History of cocaine abuse (HCC)   Personal history of subdural hematoma   AKI (acute kidney injury) (HCC)   GERD (gastroesophageal reflux disease)   C4 cervical fracture (HCC)   Cigarette smoker   Pressure injury of skin   MSSA bacteremia, persistent Patient left AMA during last hospital stay.  Blood culture positive on 4/29 and (5/8) positive for MSSA.  2D echo unremarkable.  Repeat blood culture on 5/10 and 5/27, negative. --Patient is on nafcillin and rifampin.  Per ID he will need at least 4 weeks of IV antibiotics from 6/11 depending on the culture result  --Needs to be treated for full course of antibiotic in the hospital.  Alternative would be if any area local SNF is able to manage him for IV antibiotic (barrier  could be his payer source/Medicaid) -Completed course of prednisone taper on 6/12.  - considering worsening left upper extremity symptoms on 6/6 he underwent MRI of neck which showed fracture of C1 as well and osteomyelitis at C4-C5 and T2-T3 compared with previous MRI. He has progressed in spite of appropriate antibiotic for the staph aureus, after discussion with ID and neurosurgery on 6/9, s/p washout and removal of implant on 6/11 - pain meds prn (oxycodone 10 mg Q 4 hrs prn)  Acute pulmonary edema - resolved Unclear etiology.  2D echo from 5/8 with normal EF and no valvular abnormality. Repeated 2D echo on 6/1 which did not show any new valvular issues, EF 55-60%.   --Monitor strict I's/O.   --As needed IV Lasix.    Minimally displaced C4 fracture and now new displaced fractures at C1-2 Recommendation to wear Aspen collar at all times but patient does not wear it every time I have walked in the room Left upper extremity pain and weakness - has improved. -CT of the cervical spine along with MRI was performed 6/9 showing new displaced fractures at C1-2, with lateral displacement of the lateral masses of C1 relative to C2 [facility] fracture) and a fracture of the tip of the odontoid process. Progressive changes of diskitis and osteomyelitis at C4-5 and T2-3.  --continue low dose gabapentin   Cervical and thoracic spine osteomyelitis Seen on MRI of the cervical spine  from 5/27 (done with concern for myositis on MRI of the left shoulder).  Showed progressive perivertebral soft tissue swelling with concern for osteomyelitis of C1-C5, T2 and T3 vertebral bodies.  Also has ventral epidural phlegmon extending from C2-C5 and dorsal epidural phlegmon extending from C3-C4-6 6-C7, without discrete epidural abscess, high-grade stenosis or cord signal abnormality.  Also has an acute to subacute mild T3 superior endplate compression fracture. --ID recommends total 4 weeks off IV antibiotic from 6/11  depending on the culture result   - s/p Anterior cervical washout and debridement with removal of Anterior cervical instrumentation at C5-7 & C4-5 disc biopsy by Neurosurgery on 6/11 - Drain removed on 6/13 - mobilize prn with brace - Maintain ASPEN collar at all times.  Diabetes mellitus type 2, poorly controlled with hyper and hypoglycemia A1c of 12.6.  Presented with recurrent DKA which has resolved.  Currently on Lantus with Premeal aspart and sliding scale coverage.  Needs close CBG monitoring.  Essential hypertension Stable.  Continue amlodipine and losartan. PRN hydralazine.  Monitor.  Ongoing cocaine use Urine drug screen positive this admission.  He reportedly was getting agitated past 2 days and per sister he was supposed to receive paycheck with which he could buy some drugs.  On as needed clonazepam and Haldol.  Acute kidney injury Prerenal secondary to DKA.  Resolved.  Issues with decision-making capacity Seen by psychiatry multiple times this admission and has capacity.  Cannot be involuntarily committed if he decides to leave AMA.  Depression/Anxiety Continue Klonopin, Cymbalta, Zyprexa  GERD Continue Pepcid   Patient BMI: Body mass index is 22.24 kg/m.   DVT prophylaxis: SCDs Start: 03/13/20 1848    Diet:  Diet Orders (From admission, onward)    Start     Ordered   04/16/20 2042  DIET SOFT Room service appropriate? Yes; Fluid consistency: Thin  Diet effective now       Question Answer Comment  Room service appropriate? Yes   Fluid consistency: Thin      04/16/20 2041            Code Status: Full Code    Subjective 04/19/20   No new complaints, he is not using his collar, neck collar sitting next to him.  He is agreeable to go to SNF if given option Disposition Plan & Communication   Status is: Inpatient  Remains inpatient appropriate because:IV treatments appropriate due to intensity of illness or inability to take PO   Dispo: The patient  is from: Home              Anticipated d/c is to: Home              Anticipated d/c date is: > 3 days (planned final day abx 6/21)              Patient currently is not medically stable to d/c.needs to finish IV Abx while inpt. ID recommends total 4 weeks off IV antibiotic from 6/11 depending on the culture result.  I have asked TOC (on 6/13) to look into alternative placement like LTAC or local nursing facility who is willing to take him for IV antibiotics   Family Communication: Updated sister and father at bedside on 6/10.  Discussed with patient today   Consults, Procedures, Significant Events   Consultants:   Neurosurgery  Infectious Disease  Cardiology  Procedures:   MRI Cervical Spine  2D Echo  Antimicrobials:   IV Nafcillin  5/21 >>  Objective   Vitals:   04/18/20 1201 04/18/20 1948 04/19/20 0404 04/19/20 1153  BP: (!) 151/106 (!) 157/90 (!) 165/97 (!) 142/81  Pulse: 87 92 83 88  Resp: 18 18 19 20   Temp: (!) 97.5 F (36.4 C) 98.8 F (37.1 C) 98.7 F (37.1 C) 97.8 F (36.6 C)  TempSrc: Oral Oral Oral Oral  SpO2: 98% 98% 98% 98%  Weight:      Height:        Intake/Output Summary (Last 24 hours) at 04/19/2020 1206 Last data filed at 04/19/2020 0900 Gross per 24 hour  Intake 4242.76 ml  Output 1550 ml  Net 2692.76 ml   Filed Weights   03/13/20 1654 03/13/20 2055 04/01/20 2230  Weight: 59 kg 60.3 kg 70.3 kg    Physical Exam:  General exam: awake, alert, no acute distress, disheveled appearing Respiratory system: CTAB, no wheezes, rales or rhonchi, normal respiratory effort. Cardiovascular system: normal S1/S2, RRR, no pedal edema.   Neurologic: left upper extremity weak, unable to keep raised against gravity, intact grip strength.  Other extremities with motor intact.  No spasticity noted. Extremities: pain with passive ROM in left shoulder flexion and abduction, no peripheral edema   Labs   Data Reviewed: I have personally reviewed following  labs and imaging studies  CBC: Recent Labs  Lab 04/13/20 0400 04/17/20 0415 04/18/20 0544  WBC 8.4 11.8* 9.3  HGB 10.3* 10.6* 10.4*  HCT 32.6* 33.4* 31.2*  MCV 95.0 95.2 93.1  PLT 349 323 283   Basic Metabolic Panel: Recent Labs  Lab 04/13/20 0400 04/17/20 0415 04/18/20 0544  NA 142 142 140  K 3.7 3.5 3.6  CL 110 108 105  CO2 26 26 28   GLUCOSE 213* 139* 116*  BUN 33* 27* 23  CREATININE 0.79 0.85 0.70  CALCIUM 8.3* 8.4* 8.1*   GFR: Estimated Creatinine Clearance: 96.4 mL/min (by C-G formula based on SCr of 0.7 mg/dL). Liver Function Tests: No results for input(s): AST, ALT, ALKPHOS, BILITOT, PROT, ALBUMIN in the last 168 hours. No results for input(s): LIPASE, AMYLASE in the last 168 hours. No results for input(s): AMMONIA in the last 168 hours. Coagulation Profile: No results for input(s): INR, PROTIME in the last 168 hours. Cardiac Enzymes: No results for input(s): CKTOTAL, CKMB, CKMBINDEX, TROPONINI in the last 168 hours. BNP (last 3 results) No results for input(s): PROBNP in the last 8760 hours. HbA1C: No results for input(s): HGBA1C in the last 72 hours. CBG: Recent Labs  Lab 04/18/20 1159 04/18/20 1705 04/18/20 2031 04/19/20 0745 04/19/20 1150  GLUCAP 139* 118* 231* 169* 274*   Lipid Profile: No results for input(s): CHOL, HDL, LDLCALC, TRIG, CHOLHDL, LDLDIRECT in the last 72 hours. Thyroid Function Tests: No results for input(s): TSH, T4TOTAL, FREET4, T3FREE, THYROIDAB in the last 72 hours. Anemia Panel: No results for input(s): VITAMINB12, FOLATE, FERRITIN, TIBC, IRON, RETICCTPCT in the last 72 hours. Sepsis Labs: No results for input(s): PROCALCITON, LATICACIDVEN in the last 168 hours.  Recent Results (from the past 240 hour(s))  CULTURE, BLOOD (ROUTINE X 2) w Reflex to ID Panel     Status: None   Collection Time: 04/14/20  9:12 PM   Specimen: Left Antecubital; Blood  Result Value Ref Range Status   Specimen Description LEFT ANTECUBITAL   Final   Special Requests   Final    BOTTLES DRAWN AEROBIC AND ANAEROBIC Blood Culture adequate volume   Culture   Final    NO GROWTH 5 DAYS Performed at  [facility], [address] [place], [place] [address]    Report Status 04/19/2020 FINAL  Final  CULTURE, BLOOD (ROUTINE X 2) w Reflex to ID Panel     Status: Abnormal   Collection Time: 04/14/20  9:22 PM   Specimen: BLOOD  Result Value Ref Range Status   Specimen Description   Final    BLOOD BLOOD LEFT HAND Performed at [facility], [address] [place], [place] [address]    Special Requests   Final    BOTTLES DRAWN AEROBIC AND ANAEROBIC Blood Culture adequate volume Performed at [facility], [address] [place], [place] [address]    Culture  Setup Time   Final    GRAM POSITIVE COCCI AEROBIC BOTTLE ONLY CRITICAL RESULT CALLED TO, READ BACK BY AND VERIFIED WITH: JASON ROBBINS AT 1808 04/15/20 BY ACR.PMF    Culture (A)  Final    STAPHYLOCOCCUS SPECIES (COAGULASE NEGATIVE) THE SIGNIFICANCE OF ISOLATING THIS ORGANISM FROM A SINGLE SET OF BLOOD CULTURES WHEN MULTIPLE SETS ARE DRAWN IS UNCERTAIN. PLEASE NOTIFY THE MICROBIOLOGY DEPARTMENT WITHIN ONE WEEK IF SPECIATION AND SENSITIVITIES ARE REQUIRED. Performed at [facility], [address] [place], [place] [address]    Report Status 04/17/2020 FINAL  Final  Blood Culture ID Panel (Reflexed)     Status: Abnormal   Collection Time: 04/14/20  9:23 PM  Result Value Ref Range Status   Enterococcus species NOT DETECTED NOT DETECTED Final   Listeria monocytogenes NOT DETECTED NOT DETECTED Final   Staphylococcus species DETECTED (A) NOT DETECTED Final    Comment: Methicillin (oxacillin) resistant coagulase negative staphylococcus. Possible blood culture contaminant (unless isolated from more than one blood culture draw or clinical case suggests pathogenicity). No antibiotic treatment is indicated for blood  culture contaminants. CRITICAL RESULT  CALLED TO, READ BACK BY AND VERIFIED WITH: JASON ROBBINS 04/15/20 @1808  BY ACR    Staphylococcus aureus (BCID) NOT DETECTED NOT DETECTED Final   Methicillin resistance DETECTED (A) NOT DETECTED Final    Comment: CRITICAL RESULT CALLED TO, READ BACK BY AND VERIFIED WITH: JASON ROBBINS 04/15/20 @1808  BY ACR    Streptococcus species NOT DETECTED NOT DETECTED Final   Streptococcus agalactiae NOT DETECTED NOT DETECTED Final   Streptococcus pneumoniae NOT DETECTED NOT DETECTED Final   Streptococcus pyogenes NOT DETECTED NOT DETECTED Final   Acinetobacter baumannii NOT DETECTED NOT DETECTED Final   Enterobacteriaceae species NOT DETECTED NOT DETECTED Final   Enterobacter cloacae complex NOT DETECTED NOT DETECTED Final   Escherichia coli NOT DETECTED NOT DETECTED Final   Klebsiella oxytoca NOT DETECTED NOT DETECTED Final   Klebsiella pneumoniae NOT DETECTED NOT DETECTED Final   Proteus species NOT DETECTED NOT DETECTED Final   Serratia marcescens NOT DETECTED NOT DETECTED Final   Haemophilus influenzae NOT DETECTED NOT DETECTED Final   Neisseria meningitidis NOT DETECTED NOT DETECTED Final   Pseudomonas aeruginosa NOT DETECTED NOT DETECTED Final   Candida albicans NOT DETECTED NOT DETECTED Final   Candida glabrata NOT DETECTED NOT DETECTED Final   Candida krusei NOT DETECTED NOT DETECTED Final   Candida parapsilosis NOT DETECTED NOT DETECTED Final   Candida tropicalis NOT DETECTED NOT DETECTED Final    Comment: Performed at [facility], [address] [place], [place] [address]  Aerobic/Anaerobic Culture (surgical/deep wound)     Status: None (Preliminary result)   Collection Time: 04/16/20  8:22 AM   Specimen: PATH Cytology Misc. fluid; Body Fluid  Result Value Ref Range Status  Specimen Description   Final    WOUND Performed at [facility], [address], [place], [place] [address]    Special Requests   Final    NONE Performed at [facility], [address] [place], [place] [address]    Gram Stain NO WBC SEEN NO ORGANISMS SEEN   Final   Culture   Final    NO GROWTH 2 DAYS NO ANAEROBES ISOLATED; CULTURE IN PROGRESS FOR 5 DAYS Performed at [facility], [address] [place], [place] [address]    Report Status PENDING  Incomplete  Aerobic/Anaerobic Culture (surgical/deep wound)     Status: None (Preliminary result)   Collection Time: 04/16/20  8:22 AM   Specimen: PATH Cytology Misc. fluid; Body Fluid  Result Value Ref Range Status   Specimen Description   Final    WOUND Performed at [facility], [address], [place], [place] [address]    Special Requests   Final    NONE Performed at [facility], [address] [place], [place] [address]    Gram Stain NO WBC SEEN NO ORGANISMS SEEN   Final   Culture   Final    NO GROWTH 2 DAYS NO ANAEROBES ISOLATED; CULTURE IN PROGRESS FOR 5 DAYS Performed at [facility], [address] [place], [place] [address]    Report Status PENDING  Incomplete  Aerobic/Anaerobic Culture (surgical/deep wound)     Status: None (Preliminary result)   Collection Time: 04/16/20  8:22 AM   Specimen: PATH Bone biopsy; Tissue  Result Value Ref Range Status   Specimen Description   Final    BONE Performed at [facility], [address] [place], [place] [address]    Special Requests   Final    NONE Performed at [facility], [address] [place], [place] [address]    Gram Stain NO WBC SEEN NO ORGANISMS SEEN   Final   Culture   Final    NO GROWTH 2 DAYS NO ANAEROBES ISOLATED; CULTURE IN PROGRESS FOR 5 DAYS Performed at [facility], [address], [place], [place] [address]    Report Status PENDING  Incomplete      Imaging Studies   No results found.   Medications   Scheduled Meds: . amLODipine  10 mg Oral Daily  . aspirin EC  81 mg Oral Daily  . chlorhexidine  15 mL Mouth Rinse BID  . Chlorhexidine Gluconate  Cloth  6 each Topical Q0600  . clonazePAM  0.5 mg Oral BID  . DULoxetine  30 mg Oral Daily  . famotidine  20 mg Oral Daily  . feeding supplement (GLUCERNA SHAKE)  237 mL Oral BID BM  . furosemide  40 mg Oral BID  . gabapentin  300 mg Oral BID  . haloperidol  0.25 mg Oral BID  . insulin aspart  0-5 Units Subcutaneous QHS  . insulin aspart  0-9 Units Subcutaneous TID WC  . insulin aspart  12 Units Subcutaneous TID WC  . insulin glargine  32 Units Subcutaneous Daily  . losartan  100 mg Oral Daily  . mouth rinse  15 mL Mouth Rinse q12n4p  . methocarbamol  500 mg Oral TID  . multivitamin with minerals  1 tablet Oral Daily  . OLANZapine  5 mg Oral QHS  . polyethylene glycol  17 g Oral Daily  . rifampin  300 mg Oral Q12H   Continuous Infusions: .  sodium chloride 20.8 mL/hr at 04/01/20 0938  . sodium chloride 75 mL/hr at 04/19/20 0607  . nafcillin (NAFCIL) continuous infusion 12 g (04/19/20 1206)       LOS: 37 days    Time spent: 15 minutes    Blayze Haen Manuella Ghazi, DO [facility]  04/19/2020, 12:06 PM    If 7PM-7AM, please contact night-coverage. How to contact the [facility] Attending or Consulting provider [place] or covering provider during after hours [place], for this patient?    1. Check the care team in [facility] and look for a) attending/consulting [facility] provider listed and b) the [facility] team listed 2. Log into [email or web address] and use [place]'s universal password to access. If you do not have the password, please contact the hospital operator. 3. Locate the [facility] provider you are looking for under [facility] and page to a number that you can be directly reached. 4. If you still have difficulty reaching the provider, please page the [facility] (Director on Call) for the Hospitalists listed on amion for assistance.

## 2020-04-19 NOTE — Progress Notes (Signed)
Procedure: Debridement and removal of ACDF plate Procedure date: 04/16/2020 Diagnosis: Cervical discitis  History: Spencer [name] is s/p debridement and removal of ACDF plate  POD3: Sitting in bed. Not wearing collar as he says he's about to eat. Swallowing without dysphagia. Pain well controlled. No new neurologic deficits.   POD2: Seen in bed this morning, not wearing brace, although he reports he took it off to eat. Nursing reports that he is noncompliant with the brace still.  He reports that he feels that his left hand strength is improving. No new deficits to upper/lower extremities. Neck incision remains clean, dry, intact. No evidence of hematoma.  He denies dysphagia.   Drain output per nursing is 53ml over the last 24 hours, possibly since placement in OR (no documentation of drain output since surgery but per nursing they believe this is all it has put out since surgery).  POD1: No events overnight.  He is comfortable.  Not wearing brace when I entered.  I replaced brace.  No new deficits.  POD0: Pt evaluated on inpatient unit, awake, alert, oriented. Reports that pain is well controlled.   Physical Exam: Vitals:   04/19/20 0404 04/19/20 1153  BP: (!) 165/97 (!) 142/81  Pulse: 83 88  Resp: 19 20  Temp: 98.7 F (37.1 C) 97.8 F (36.6 C)  SpO2: 98% 98%    General: Alert and oriented, sitting in bed, eating breakfast  Strength: No new deficits noted.  Side Biceps Triceps Deltoid Interossei Grip Wrist Ext. Wrist Flex.  R 5 5 5 5 5 5 5   L 4 5 4- 5 5 5 5    Side Iliopsoas Quads Hamstring PF DF EHL  R 5 5 5 5 5 5   L 5 5 5 5 5 5    Sensation: intact and symmetric throughout extremities Skin: Incision to anterior neck is clean, dry, intact, no evidence of hematoma or swelling noted.     Data:  Recent Labs  Lab 04/13/20 0400 04/13/20 0400 04/17/20 0415 04/17/20 0415 04/18/20 0544  NA 142   < > 142   < > 140  K 3.7   < > 3.5   < > 3.6  CL 110   < > 108   < > 105   CO2 26   < > 26   < > 28  BUN 33*   < > 27*   < > 23  CREATININE 0.79   < > 0.85   < > 0.70  GLUCOSE 213*   < > 139*   < > 116*  CALCIUM 8.3*  --  8.4*  --  8.1*   < > = values in this interval not displayed.   No results for input(s): AST, ALT, ALKPHOS in the last 168 hours.  Invalid input(s): TBILI   Recent Labs  Lab 04/13/20 0400 04/13/20 0400 04/17/20 0415 04/17/20 0415 04/18/20 0544  WBC 8.4   < > 11.8*   < > 9.3  HGB 10.3*   < > 10.6*   < > 10.4*  HCT 32.6*   < > 33.4*   < > 31.2*  PLT 349  --  323  --  283   < > = values in this interval not displayed.   No results for input(s): APTT, INR in the last 168 hours.      Intraoperative cultures from 04/16/2020: Gram Stain NO WBC SEEN  NO ORGANISMS SEEN   Culture NO GROWTH < 24 HOURS    Assessment/Plan:  Spencer [name] is POD3 s/p Debridement and removal of ACDF plate.   - mobilize prn with brace - pain control per primary team - Tolerating soft food diet without any difficulty swallowing.  - Maintain ASPEN collar at all times. I again reviewed the need to wear the ASPEN collar at all times, and the importance of wearing. Nursing asked to continue to reiterate importance of wearing brace. - Safe for discharge from a neurosurgery perspective, postoperative follow up has been made with our clinic.   Neurosurgery will sign off at this time, however, feel free to call us if you have any further questions.    Patsey Berthold, NP Department of Neurosurgery

## 2020-04-19 NOTE — Progress Notes (Signed)
Upon entering patient's room, patient was resting quietly in bed without the cervical collar on; when asked why he refused the collar, he stated, ".Marland KitchenMarland KitchenI'm not refusing to wear it, I just can't eat or drink with it on..."; I instructed him to please put it on, that we were concerned with his well-being and safety of his spine/neck; patient applied cervical collar while I stood at bedside; Strongly encouraged patient to keep it on. Voiced understanding. Call bell in reach, bed alarm set, possessions within reach. Windy Carina, RN

## 2020-04-19 NOTE — Progress Notes (Signed)
ID Patient awake and alert Is not wearing the neck collar Cervical drain has been removed Pain is under good control No fever   Patient Vitals for the past 24 hrs:  BP Temp Temp src Pulse Resp SpO2  04/19/20 1153 (!) 142/81 97.8 F (36.6 C) Oral 88 20 98 %  04/19/20 0404 (!) 165/97 98.7 F (37.1 C) Oral 83 19 98 %  04/18/20 1948 (!) 157/90 98.8 F (37.1 C) Oral 92 18 98 %   On examination surgical site clean No neck collar Chest bilateral air entry HS: S1-S2  CBC Latest Ref Rng & Units 04/18/2020 04/17/2020 04/13/2020  WBC 4.0 - 10.5 K/uL 9.3 11.8(H) 8.4  Hemoglobin 13.0 - 17.0 g/dL 10.4(L) 10.6(L) 10.3(L)  Hematocrit 39 - 52 % 31.2(L) 33.4(L) 32.6(L)  Platelets 150 - 400 K/uL 283 323 349     CMP Latest Ref Rng & Units 04/18/2020 04/17/2020 04/13/2020  Glucose 70 - 99 mg/dL 116(H) 139(H) 213(H)  BUN 8 - 23 mg/dL 23 27(H) 33(H)  Creatinine 0.61 - 1.24 mg/dL 0.70 0.85 0.79  Sodium 135 - 145 mmol/L 140 142 142  Potassium 3.5 - 5.1 mmol/L 3.6 3.5 3.7  Chloride 98 - 111 mmol/L 105 108 110  CO2 22 - 32 mmol/L _0 Calcium 8.9 - 10.3 mg/dL 8.1(L) 8.4(L) 8.3(L)  Total Protein 6.5 - 8.1 g/dL - - -  Total Bilirubin 0.3 - 1.2 mg/dL - - -  Alkaline Phos 38 - 126 U/L - - -  AST 15 - 41 U/L - - -  ALT 0 - 44 U/L - - -    Micro 4/29 BC 4/4 MSSA 5/8 BC 4/4 MSSA 5/10 BC NG 5/27 BC NG 6/9 BC 1 of 4 coag neg staph -contaminant   .  4/29CT cervical spine minimally displaced fracture at the anterior margin of C4 with prevertebral fluid related to the fracture  5/12MRI- No evidence of discitis - prevertebral effusion Extending the length of the cervical spine   04/01/20- C1, dens, C4, C5 superior endplate, T2 and T3 vertebral bodies C5-C7 ACDF - no focal bone lesion Progressive prevetebral soft tissue swelling extending from the base of the clivus to c6 .  04/14/20 - displaced fractures of the anterior and posterior arches of C1 as well as the tip of odontoid  process. progressive diskitis and OM at c4-5- T2-T3 stable findings post C5-C7   Impression/recommendation  MSSA bacteremia with cervical vertebrae multiple level infection eventhough inflammatory markers like ESR, CRP, and thrombocytosis have all improved(or resolved), and blood cultures have remained negative for more than a month and pt is afebrile he is progressing locally at the cervical spine level  Because of thefall he hadfracture at C4 , now he also has fracture of C1. There is osteo of C4-C5 and t2-T3 He has ACDF at C5-C7 done in 2006 and this was not the site of infection in the beginning andwasstable.    But he has persistent Prevertebral phlegmonextending now upto C6 concern for hardware being involved in the infection.Marland Kitchen He is on nafcillin /rifampin. Discussed with Dr.Yarbrough and reviewed MRI films Hehad a washout and removal of the hardware on 04/16/20. Cultures no growth Post surgery he will be needing IV antibiotics for atleast 4 weeks depending on the culture result Asked patient to wear neck collar.  Which he did Will follow ESR CRP. Discussed the management with care team

## 2020-04-19 NOTE — Progress Notes (Addendum)
Daily Progress Note   Patient Name: Spencer [name]       Date: 04/19/2020 DOB: June 24, 1961  Age: 59 y.o. MRN#: [id number] Attending Physician: Max Sane, MD Primary Care Physician: No primary care provider on file. Admit Date: 03/13/2020  Reason for Consultation/Follow-up: Establishing goals of care  Subjective: Patient is resting in bed, no family at bedside. He is sleeping and awakens upon my entry. His c-collar is laying on the bed at his left leg.  He states immediately he will wear his collar and states he takes it off to sleep. He states that the risks of not wearing the collar have been explained and "I could be stuck in bed for the rest of my life, and I would not want to live that way." He states he feels better today than he did yesterday, and tells me he wants all care at this time and would like SNF placement.     Length of Stay: 37  Current Medications: Scheduled Meds:  . amLODipine  10 mg Oral Daily  . aspirin EC  81 mg Oral Daily  . chlorhexidine  15 mL Mouth Rinse BID  . Chlorhexidine Gluconate Cloth  6 each Topical Q0600  . clonazePAM  0.5 mg Oral BID  . DULoxetine  30 mg Oral Daily  . famotidine  20 mg Oral Daily  . feeding supplement (GLUCERNA SHAKE)  237 mL Oral BID BM  . furosemide  40 mg Oral BID  . gabapentin  300 mg Oral BID  . haloperidol  0.25 mg Oral BID  . insulin aspart  0-5 Units Subcutaneous QHS  . insulin aspart  0-9 Units Subcutaneous TID WC  . insulin aspart  12 Units Subcutaneous TID WC  . insulin glargine  32 Units Subcutaneous Daily  . losartan  100 mg Oral Daily  . mouth rinse  15 mL Mouth Rinse q12n4p  . methocarbamol  500 mg Oral TID  . multivitamin with minerals  1 tablet Oral Daily  . OLANZapine  5 mg Oral QHS  . polyethylene glycol  17 g  Oral Daily  . rifampin  300 mg Oral Q12H    Continuous Infusions: . sodium chloride 20.8 mL/hr at 04/01/20 0938  . sodium chloride 75 mL/hr at 04/19/20 0607  . nafcillin (NAFCIL) continuous infusion 20.8 mL/hr at 04/19/20  0500    PRN Meds: sodium chloride, acetaminophen, bisacodyl, bisacodyl, dextrose, haloperidol, hydrALAZINE, HYDROcodone-acetaminophen, ipratropium-albuterol, sodium chloride flush, traMADol  Physical Exam Pulmonary:     Effort: Pulmonary effort is normal.  Neurological:     Mental Status: He is alert.             Vital Signs: BP (!) 165/97 (BP Location: Right Arm)   Pulse 83   Temp 98.7 F (37.1 C) (Oral)   Resp 19   Ht 5\' 10"  (1.778 m)   Wt 70.3 kg   SpO2 98%   BMI 22.24 kg/m  SpO2: SpO2: 98 % O2 Device: O2 Device: Room Air O2 Flow Rate: O2 Flow Rate (L/min): 3 L/min  Intake/output summary:   Intake/Output Summary (Last 24 hours) at 04/19/2020 1018 Last data filed at 04/19/2020 0900 Gross per 24 hour  Intake 4242.76 ml  Output 1550 ml  Net 2692.76 ml   LBM: Last BM Date: 04/16/20 Baseline Weight: Weight: 59 kg Most recent weight: Weight: 70.3 kg       Palliative Assessment/Data: 30%      Patient Active Problem List   Diagnosis Date Noted  . MSSA bacteremia 03/14/2020  . Cigarette smoker 03/14/2020  . Pressure injury of skin 03/14/2020  . C4 cervical fracture (HCC) 03/05/2020  . Atrial fibrillation with RVR (HCC) 08/07/2019  . GERD (gastroesophageal reflux disease) 08/07/2019  . AKI (acute kidney injury) (HCC) 02/19/2019  . Diabetic neuropathy (HCC) 01/16/2017  . DM2 (diabetes mellitus, type 2) (HCC) 01/16/2017  . History of cocaine abuse (HCC) 01/16/2017  . Personal history of subdural hematoma 01/16/2017  . Protein-calorie malnutrition, severe 11/08/2016  . DKA (diabetic ketoacidoses) (HCC) 04/03/2015  . Hypertension 04/03/2015  . Depression 04/03/2015  . Opiate dependence (HCC) 04/03/2015  . Type II or unspecified type  diabetes mellitus without mention of complication, not stated as uncontrolled 07/19/2014  . Hernia of flank 09/20/2012    Palliative Care Assessment & Plan     Recommendations/Plan:  Full code/full scope.     Code Status:    Code Status Orders  (From admission, onward)         Start     Ordered   03/13/20 1847  Full code  Continuous        03/13/20 1853        Code Status History    Date Active Date Inactive Code Status Order ID Comments User Context   03/03/2020 1619 03/10/2020 0710 Full Code 05/10/2020  [id number], MD ED   02/13/2020 2218 02/17/2020 2157 DNR 2158  [id number], NP Inpatient   02/13/2020 1736 02/13/2020 2218 Full Code 04/14/2020  [id number], MD ED   12/15/2019 1214 12/17/2019 1852 Full Code 02/14/2020  [id number], MD ED   11/26/2019 0048 11/28/2019 1901 Full Code 11/30/2019  Mansy, [id number], MD ED   10/24/2019 1530 10/26/2019 1841 Full Code 10/28/2019  [id number], MD ED   09/12/2019 2047 09/15/2019 1648 Full Code 13/07/2019  Mansy, [id number], MD ED   09/04/2019 1801 09/10/2019 1648 Full Code 13/02/2019  [id number], MD Inpatient   09/04/2019 1748 09/04/2019 1801 Full Code 09/06/2019  [id number], MD Inpatient   08/19/2019 0551 08/21/2019 1813 Full Code 08/23/2019  Mansy, [id number], MD ED   08/07/2019 0221 08/08/2019 1802 Full Code 10/08/2019  [id number], MD ED   07/27/2019 1922 07/29/2019 2245 Full Code 07/31/2019  [id number], MD ED   06/09/2019 2125 06/12/2019 2007 Full Code 08/12/2019  Pearletha Alfred, NP ED   05/28/2019 1845 06/04/2019 2148 Full Code [id number]  Katha Hamming, MD ED   02/19/2019 0122 02/19/2019 1901 Full Code [id number]  Oralia Manis, MD Inpatient   02/08/2019 0130 02/13/2019 1546 Full Code [id number]  Arnaldo Natal, MD ED   01/17/2019 0412 01/18/2019 1903 Full Code [id number]  Mansy, Vernetta Honey, MD ED   01/06/2019 1729 01/08/2019 1427 Full Code [id number]  Alford Highland, MD ED   10/25/2018 1356 10/27/2018 1345 Full Code [id number]  Auburn Bilberry, MD Inpatient   10/23/2018 1828 10/24/2018 1759 Full Code [id number]  Auburn Bilberry, MD Inpatient   01/15/2017 1417 01/18/2017 1716 Full Code [id number]  Enid Baas, MD Inpatient   11/06/2016 0319 11/09/2016 1508 Full Code [id number]  Marton Redwood Mordecai Rasmussen, MD Inpatient   11/06/2016 0229 11/06/2016 0319 Full Code [id number]  de Casandra Doffing, MD Inpatient   04/04/2015 0008 04/06/2015 1418 Full Code [id number]  Haydee Monica, MD Inpatient   Advance Care Planning Activity       Prognosis:  Poor overall  Thank you for allowing the Palliative Medicine Team to assist in the care of this patient. Greater than 50%  of this time was spent counseling and coordinating care related to the above assessment and plan.  Total Time 15 min Prolonged Time Billed no         Morton Stall, NP  Please contact Palliative Medicine Team phone at [phone] for questions and concerns.

## 2020-04-19 NOTE — TOC Progression Note (Signed)
Transition of Care [facility]) - Progression Note    Patient Details  Name: Spencer [name] MRN: [id number] Date of Birth: [birth year]  Transition of Care [facility] CM/SW Contact  Chapman Fitch, RN Phone Number: 04/19/2020, 3:25 PM  Clinical Narrative:    Discharge disposition discussed with patient at bedside, sister was on speaker phone.  Patient is in agreement to go to SNF for IV antibiotics then transition to long term care bed under Medicaid.   Bed search extended  Patient, family and MD notified that there is potential we will not receive any bed offers.    Expected Discharge Plan: Home/Self Care Barriers to Discharge: Continued Medical Work up  Expected Discharge Plan and Services Expected Discharge Plan: Home/Self Care In-house Referral: Clinical Social Work Discharge Planning Services: CM Consult   Living arrangements for the past 2 months: Single Family Home                 DME Arranged: Walker rolling DME Agency: [employer] Date DME Agency Contacted: 03/18/20 Time DME Agency Contacted: 1100 Representative spoke with at DME Agency: Zack             Social Determinants of Health (SDOH) Interventions    Readmission Risk Interventions Readmission Risk Prevention Plan 04/07/2020 03/05/2020 02/16/2020  Transportation Screening Complete Complete Complete  PCP or Specialist Appt within 3-5 Days - - -  HRI or Home Care Consult - - -  HRI or Home Care Consult comments - - -  Palliative Care Screening - - -  Medication Review (RN Care Manager) Complete Complete Complete  PCP or Specialist appointment within 3-5 days of discharge - Patient refused Complete  HRI or Home Care Consult - Complete Complete  SW Recovery Care/Counseling Consult - - -  SW Consult Not Complete Comments - - -  Palliative Care Screening Not Applicable - -  Skilled Nursing Facility Not Applicable Complete Complete  Some recent data might be hidden

## 2020-04-20 LAB — GLUCOSE, CAPILLARY
Glucose-Capillary: 97 mg/dL (ref 70–99)
Glucose-Capillary: 204 mg/dL — ABNORMAL HIGH (ref 70–99)
Glucose-Capillary: 189 mg/dL — ABNORMAL HIGH (ref 70–99)
Glucose-Capillary: 139 mg/dL — ABNORMAL HIGH (ref 70–99)

## 2020-04-20 MED ORDER — ENOXAPARIN SODIUM 40 MG/0.4ML ~~LOC~~ SOLN
40.0000 mg | SUBCUTANEOUS | Status: DC
  Administered 2020-04-20 – 2020-04-27 (×8): 40 mg via SUBCUTANEOUS
  Filled 2020-04-20 (×8): qty 0.4

## 2020-04-20 NOTE — Progress Notes (Signed)
Patient noted to not have cervical collar on. Windy Carina, RN 1:53 AM 04/20/2020

## 2020-04-20 NOTE — Progress Notes (Signed)
PROGRESS NOTE    Spencer [name]   ZOX:[id number]  DOB: 31-Oct-1961  PCP: No primary care provider on file.    DOA: 03/13/2020 LOS: 71   Brief Narrative   59 year old male with history of uncontrolled type 2 diabetes mellitus, cocaine use, brain aneurysm, hypertension, GERD recently hospitalized on 4/28 for DKA, MSSA bacteremia and AKI but signed out AMA on 5/5 was brought by EMS with hyperglycemia and encephalopathy.  In the ED was found to be in DKA with high anion gap of 30.  Given aggressive IV hydration and insulin drip with resolution of DKA.  Hospital course prolonged due to presence of recurrent MSSA bacteremia.    DKA resolved.. The patient will need to complete 6 weeks of IV nafcillin prior to discharge per ID.  Last day of therapy would be 04/26/2020.   On the evening of 04/03/2020 the patient developed acute flash pulmonary edema. He was transferred to the ICU and given diuresis and placed on BIPAP.  He had similar presentation on 5/27 requiring transfer to ICU.    Assessment & Plan   Principal Problem:   MSSA bacteremia Active Problems:   DKA (diabetic ketoacidoses) (HCC)   Hypertension   Diabetic neuropathy (HCC)   DM2 (diabetes mellitus, type 2) (HCC)   History of cocaine abuse (HCC)   Personal history of subdural hematoma   AKI (acute kidney injury) (HCC)   GERD (gastroesophageal reflux disease)   C4 cervical fracture (HCC)   Cigarette smoker   Pressure injury of skin   MSSA bacteremia, persistent Patient left AMA during last hospital stay.  Blood culture positive on 4/29 and (5/8) positive for MSSA.  2D echo unremarkable.  Repeat blood culture on 5/10 and 5/27, negative. --he will need at least 4 weeks of IV nafcillin and rifampin from 6/11 (from last washout surgery) --Needs to be treated for full course of antibiotic in the hospital.  Alternative would be if any area local SNF is able to manage him for IV antibiotic (barrier could be his payer source/Medicaid)  -TOC team working on it -Completed course of prednisone taper on 6/12.  - considering worsening left upper extremity symptoms on 6/6 he underwent MRI of neck which showed fracture of C1 as well and osteomyelitis at C4-C5 and T2-T3 compared with previous MRI. He has progressed in spite of appropriate antibiotic for the staph aureus, after discussion with ID and neurosurgery on 6/9, s/p washout and removal of implant on 6/11 - pain meds prn (oxycodone 10 mg Q 4 hrs prn)  Acute pulmonary edema - resolved Unclear etiology.  2D echo from 5/8 with normal EF and no valvular abnormality. Repeated 2D echo on 6/1 which did not show any new valvular issues, EF 55-60%.   --Monitor strict I's/O.  -9.8 L --As needed IV Lasix.    Minimally displaced C4 fracture and now new displaced fractures at C1-2 Recommendation to wear Aspen collar at all times but patient does not wear it every time I have walked in the room Left upper extremity pain and weakness - has improved. -CT of the cervical spine along with MRI was performed 6/9 showing new displaced fractures at C1-2, with lateral displacement of the lateral masses of C1 relative to C2 [facility] fracture) and a fracture of the tip of the odontoid process. Progressive changes of diskitis and osteomyelitis at C4-5 and T2-3.  --continue low dose gabapentin   Cervical and thoracic spine osteomyelitis Seen on MRI of the cervical spine from 5/27 (done  with concern for myositis on MRI of the left shoulder).  Showed progressive perivertebral soft tissue swelling with concern for osteomyelitis of C1-C5, T2 and T3 vertebral bodies.  Also has ventral epidural phlegmon extending from C2-C5 and dorsal epidural phlegmon extending from C3-C4-6 6-C7, without discrete epidural abscess, high-grade stenosis or cord signal abnormality.  Also has an acute to subacute mild T3 superior endplate compression fracture. --ID recommends total 4 weeks off IV antibiotic from 6/11 depending on  the culture result   - s/p Anterior cervical washout and debridement with removal of Anterior cervical instrumentation at C5-7 & C4-5 disc biopsy by Neurosurgery on 6/11 - Drain removed on 6/13 - mobilize prn with brace - Maintain ASPEN collar at all times.  Diabetes mellitus type 2, poorly controlled with hyper and hypoglycemia A1c of 12.6.  Presented with recurrent DKA which has resolved.  Currently on Lantus with Premeal aspart and sliding scale coverage.  Needs close CBG monitoring.  Essential hypertension Stable.  Continue amlodipine and losartan. PRN hydralazine.  Monitor.  Ongoing cocaine use Urine drug screen positive this admission.  He reportedly was getting agitated past 2 days and per sister he was supposed to receive paycheck with which he could buy some drugs.  On as needed clonazepam and Haldol.  Acute kidney injury Prerenal secondary to DKA.  Resolved.  Issues with decision-making capacity Seen by psychiatry multiple times this admission and has capacity.  Cannot be involuntarily committed if he decides to leave AMA.  Depression/Anxiety Continue Klonopin, Cymbalta, Zyprexa  GERD Continue Pepcid   Patient BMI: Body mass index is 22.24 kg/m.   DVT prophylaxis: SCDs Start: 03/13/20 1848    Diet:  Diet Orders (From admission, onward)    Start     Ordered   04/16/20 2042  DIET SOFT Room service appropriate? Yes; Fluid consistency: Thin  Diet effective now       Question Answer Comment  Room service appropriate? Yes   Fluid consistency: Thin      04/16/20 2041            Code Status: Full Code    Subjective 04/20/20   He is not wearing his cervical collar, no complaints Disposition Plan & Communication   Status is: Inpatient  Remains inpatient appropriate because:IV treatments appropriate due to intensity of illness or inability to take PO   Dispo: The patient is from: Home              Anticipated d/c is to: Home              Anticipated d/c  date is: > 3 days (planned final day abx 6/21)              Patient currently is not medically stable to d/c.needs to finish IV Abx while inpt. ID recommends total 4 weeks off IV antibiotic from 6/11 depending on the culture result.  I have asked TOC (on 6/13) to look into alternative placement like LTAC or local nursing facility who is willing to take him for IV antibiotics   Family Communication: Updated sister and father at bedside on 6/10.  Discussed with patient today   Consults, Procedures, Significant Events   Consultants:   Neurosurgery  Infectious Disease  Cardiology  Procedures:   MRI Cervical Spine  2D Echo  Antimicrobials:   IV Nafcillin  5/21 >>    Objective   Vitals:   04/19/20 1932 04/20/20 0420 04/20/20 1051 04/20/20 1157  BP: (!) 147/95 (!) 148/98 Marland Kitchen)  161/102 (!) 176/103  Pulse: 91 92  89  Resp: 20 18    Temp: 98.7 F (37.1 C) 98.3 F (36.8 C)  97.6 F (36.4 C)  TempSrc: Oral Oral  Oral  SpO2: 99% 100%  99%  Weight:      Height:        Intake/Output Summary (Last 24 hours) at 04/20/2020 1406 Last data filed at 04/20/2020 0900 Gross per 24 hour  Intake 3104.77 ml  Output 1875 ml  Net 1229.77 ml   Filed Weights   03/13/20 1654 03/13/20 2055 04/01/20 2230  Weight: 59 kg 60.3 kg 70.3 kg    Physical Exam:  General exam: awake, alert, no acute distress Respiratory system: CTAB, no wheezes, rales or rhonchi, normal respiratory effort. Cardiovascular system: normal S1/S2, RRR, no pedal edema.   Neurologic: left upper extremity weak, unable to keep raised against gravity, intact grip strength.  Other extremities with motor intact.  No spasticity noted. Extremities: pain with passive ROM in left shoulder flexion and abduction, no peripheral edema   Labs   Data Reviewed: I have personally reviewed following labs and imaging studies  CBC: Recent Labs  Lab 04/17/20 0415 04/18/20 0544  WBC 11.8* 9.3  HGB 10.6* 10.4*  HCT 33.4* 31.2*  MCV  95.2 93.1  PLT 323 161   Basic Metabolic Panel: Recent Labs  Lab 04/17/20 0415 04/18/20 0544  NA 142 140  K 3.5 3.6  CL 108 105  CO2 26 28  GLUCOSE 139* 116*  BUN 27* 23  CREATININE 0.85 0.70  CALCIUM 8.4* 8.1*   GFR: Estimated Creatinine Clearance: 96.4 mL/min (by C-G formula based on SCr of 0.7 mg/dL). Liver Function Tests: No results for input(s): AST, ALT, ALKPHOS, BILITOT, PROT, ALBUMIN in the last 168 hours. No results for input(s): LIPASE, AMYLASE in the last 168 hours. No results for input(s): AMMONIA in the last 168 hours. Coagulation Profile: No results for input(s): INR, PROTIME in the last 168 hours. Cardiac Enzymes: No results for input(s): CKTOTAL, CKMB, CKMBINDEX, TROPONINI in the last 168 hours. BNP (last 3 results) No results for input(s): PROBNP in the last 8760 hours. HbA1C: No results for input(s): HGBA1C in the last 72 hours. CBG: Recent Labs  Lab 04/19/20 1150 04/19/20 1647 04/19/20 2101 04/20/20 0745 04/20/20 1151  GLUCAP 274* 166* 223* 97 204*   Lipid Profile: No results for input(s): CHOL, HDL, LDLCALC, TRIG, CHOLHDL, LDLDIRECT in the last 72 hours. Thyroid Function Tests: No results for input(s): TSH, T4TOTAL, FREET4, T3FREE, THYROIDAB in the last 72 hours. Anemia Panel: No results for input(s): VITAMINB12, FOLATE, FERRITIN, TIBC, IRON, RETICCTPCT in the last 72 hours. Sepsis Labs: No results for input(s): PROCALCITON, LATICACIDVEN in the last 168 hours.  Recent Results (from the past 240 hour(s))  CULTURE, BLOOD (ROUTINE X 2) w Reflex to ID Panel     Status: None   Collection Time: 04/14/20  9:12 PM   Specimen: Left Antecubital; Blood  Result Value Ref Range Status   Specimen Description LEFT ANTECUBITAL  Final   Special Requests   Final    BOTTLES DRAWN AEROBIC AND ANAEROBIC Blood Culture adequate volume   Culture   Final    NO GROWTH 5 DAYS Performed at [facility], [address] [place], [place] [address]     Report Status 04/19/2020 FINAL  Final  CULTURE, BLOOD (ROUTINE X 2) w Reflex to ID Panel     Status: Abnormal   Collection Time: 04/14/20  9:22 PM  Specimen: BLOOD  Result Value Ref Range Status   Specimen Description   Final    BLOOD BLOOD LEFT HAND Performed at [facility], [address] [place], [place] [address]    Special Requests   Final    BOTTLES DRAWN AEROBIC AND ANAEROBIC Blood Culture adequate volume Performed at [facility], [address] [place], [place] [address]    Culture  Setup Time   Final    GRAM POSITIVE COCCI AEROBIC BOTTLE ONLY CRITICAL RESULT CALLED TO, READ BACK BY AND VERIFIED WITH: JASON ROBBINS AT 1808 04/15/20 BY ACR.PMF    Culture (A)  Final    STAPHYLOCOCCUS SPECIES (COAGULASE NEGATIVE) THE SIGNIFICANCE OF ISOLATING THIS ORGANISM FROM A SINGLE SET OF BLOOD CULTURES WHEN MULTIPLE SETS ARE DRAWN IS UNCERTAIN. PLEASE NOTIFY THE MICROBIOLOGY DEPARTMENT WITHIN ONE WEEK IF SPECIATION AND SENSITIVITIES ARE REQUIRED. Performed at [facility], [address], [place], [place] [address]    Report Status 04/17/2020 FINAL  Final  Blood Culture ID Panel (Reflexed)     Status: Abnormal   Collection Time: 04/14/20  9:23 PM  Result Value Ref Range Status   Enterococcus species NOT DETECTED NOT DETECTED Final   Listeria monocytogenes NOT DETECTED NOT DETECTED Final   Staphylococcus species DETECTED (A) NOT DETECTED Final    Comment: Methicillin (oxacillin) resistant coagulase negative staphylococcus. Possible blood culture contaminant (unless isolated from more than one blood culture draw or clinical case suggests pathogenicity). No antibiotic treatment is indicated for blood  culture contaminants. CRITICAL RESULT CALLED TO, READ BACK BY AND VERIFIED WITH: JASON ROBBINS 04/15/20 @1808  BY ACR    Staphylococcus aureus (BCID) NOT DETECTED NOT DETECTED Final   Methicillin resistance DETECTED (A) NOT DETECTED Final    Comment: CRITICAL  RESULT CALLED TO, READ BACK BY AND VERIFIED WITH: JASON ROBBINS 04/15/20 @1808  BY ACR    Streptococcus species NOT DETECTED NOT DETECTED Final   Streptococcus agalactiae NOT DETECTED NOT DETECTED Final   Streptococcus pneumoniae NOT DETECTED NOT DETECTED Final   Streptococcus pyogenes NOT DETECTED NOT DETECTED Final   Acinetobacter baumannii NOT DETECTED NOT DETECTED Final   Enterobacteriaceae species NOT DETECTED NOT DETECTED Final   Enterobacter cloacae complex NOT DETECTED NOT DETECTED Final   Escherichia coli NOT DETECTED NOT DETECTED Final   Klebsiella oxytoca NOT DETECTED NOT DETECTED Final   Klebsiella pneumoniae NOT DETECTED NOT DETECTED Final   Proteus species NOT DETECTED NOT DETECTED Final   Serratia marcescens NOT DETECTED NOT DETECTED Final   Haemophilus influenzae NOT DETECTED NOT DETECTED Final   Neisseria meningitidis NOT DETECTED NOT DETECTED Final   Pseudomonas aeruginosa NOT DETECTED NOT DETECTED Final   Candida albicans NOT DETECTED NOT DETECTED Final   Candida glabrata NOT DETECTED NOT DETECTED Final   Candida krusei NOT DETECTED NOT DETECTED Final   Candida parapsilosis NOT DETECTED NOT DETECTED Final   Candida tropicalis NOT DETECTED NOT DETECTED Final    Comment: Performed at [facility], [address] [place], [address] [place]  Aerobic/Anaerobic Culture (surgical/deep wound)     Status: None (Preliminary result)   Collection Time: 04/16/20  8:22 AM   Specimen: PATH Cytology Misc. fluid; Body Fluid  Result Value Ref Range Status   Specimen Description   Final    WOUND Performed at [facility], [address], [place], [address] [place]    Special Requests   Final    NONE Performed at [facility], [address] [place], [address]  [address]    Gram Stain NO WBC SEEN NO ORGANISMS SEEN   Final   Culture   Final    NO GROWTH 4 DAYS NO ANAEROBES ISOLATED; CULTURE IN PROGRESS FOR 5 DAYS Performed at [facility],  [address] [place], [place] [address]    Report Status PENDING  Incomplete  Aerobic/Anaerobic Culture (surgical/deep wound)     Status: None (Preliminary result)   Collection Time: 04/16/20  8:22 AM   Specimen: PATH Cytology Misc. fluid; Body Fluid  Result Value Ref Range Status   Specimen Description   Final    WOUND Performed at [facility], [address], [place], [place] [address]    Special Requests   Final    NONE Performed at [facility], [address] [place], [place] [address]    Gram Stain NO WBC SEEN NO ORGANISMS SEEN   Final   Culture   Final    NO GROWTH 4 DAYS NO ANAEROBES ISOLATED; CULTURE IN PROGRESS FOR 5 DAYS Performed at [facility], [address], [place], [place] [address]    Report Status PENDING  Incomplete  Aerobic/Anaerobic Culture (surgical/deep wound)     Status: None (Preliminary result)   Collection Time: 04/16/20  8:22 AM   Specimen: PATH Bone biopsy; Tissue  Result Value Ref Range Status   Specimen Description   Final    BONE Performed at [facility], [address] [place], [place] [address]    Special Requests   Final    NONE Performed at [facility], [address] [place], [place] [address]    Gram Stain NO WBC SEEN NO ORGANISMS SEEN   Final   Culture   Final    NO GROWTH 4 DAYS NO ANAEROBES ISOLATED; CULTURE IN PROGRESS FOR 5 DAYS Performed at [facility], [address], [place], [place] [address]    Report Status PENDING  Incomplete      Imaging Studies   No results found.   Medications   Scheduled Meds: . amLODipine  10 mg Oral Daily  . aspirin EC  81 mg Oral Daily  . chlorhexidine  15 mL Mouth Rinse BID  . Chlorhexidine Gluconate Cloth  6 each Topical Q0600  . clonazePAM  0.5 mg Oral BID  . DULoxetine  30 mg Oral Daily  . famotidine  20 mg Oral Daily  . feeding supplement (GLUCERNA SHAKE)  237 mL Oral BID BM  . furosemide  40 mg Oral BID  . gabapentin   300 mg Oral BID  . haloperidol  0.25 mg Oral BID  . insulin aspart  0-5 Units Subcutaneous QHS  . insulin aspart  0-9 Units Subcutaneous TID WC  . insulin aspart  12 Units Subcutaneous TID WC  . insulin glargine  32 Units Subcutaneous Daily  . losartan  100 mg Oral Daily  . mouth rinse  15 mL Mouth Rinse q12n4p  . methocarbamol  500 mg Oral TID  . multivitamin with minerals  1 tablet Oral Daily  . OLANZapine  5 mg Oral QHS  . polyethylene glycol  17 g Oral Daily  . rifampin  300 mg Oral Q12H   Continuous Infusions: . sodium chloride 20.8 mL/hr at 04/01/20 0938  . sodium chloride 75 mL/hr at 04/20/20 1022  . nafcillin (NAFCIL) continuous infusion 20.8 mL/hr at 04/20/20 0700       LOS: 38 days    Time spent: 15 minutes  Vonte Rossin Sherryll BurgerShah, DO [facility]  04/20/2020, 2:06 PM    If 7PM-7AM, please contact night-coverage. How to contact the [facility] Attending or Consulting provider 7A - 7P or covering provider during after hours 7P -7A, for this patient?    1. Check the care team in [facility] and look for a) attending/consulting [facility] provider listed and b) the [facility] team listed 2. Log into [email or web address] and use [place]'s universal password to access. If you do not have the password, please contact the hospital operator. 3. Locate the [facility] provider you are looking for under [facility] and page to a number that you can be directly reached. 4. If you still have difficulty reaching the provider, please page the [facility] (Director on Call) for the Hospitalists listed on amion for assistance.

## 2020-04-20 NOTE — Progress Notes (Addendum)
Cervical collar not on at present.Windy Carina, RN 7:03 AM 04/20/2020  Denies numbness and/or tingling in arms/legs. Windy Carina, RN

## 2020-04-20 NOTE — Progress Notes (Signed)
Cervical collar reapplied. Windy Carina, RN 2:11 AM 04/20/2020

## 2020-04-21 LAB — HEPATIC FUNCTION PANEL
ALT: 19 U/L (ref 0–44)
AST: 17 U/L (ref 15–41)
Albumin: 2 g/dL — ABNORMAL LOW (ref 3.5–5.0)
Alkaline Phosphatase: 88 U/L (ref 38–126)
Bilirubin, Direct: <0.1 mg/dL (ref 0.0–0.2)
Total Bilirubin: 1 mg/dL (ref 0.3–1.2)
Total Protein: 5.4 g/dL — ABNORMAL LOW (ref 6.5–8.1)

## 2020-04-21 LAB — BASIC METABOLIC PANEL
Anion gap: 6 (ref 5–15)
BUN: 21 mg/dL (ref 8–23)
CO2: 26 mmol/L (ref 22–32)
Calcium: 8 mg/dL — ABNORMAL LOW (ref 8.9–10.3)
Chloride: 107 mmol/L (ref 98–111)
Creatinine, Ser: 0.77 mg/dL (ref 0.61–1.24)
GFR calc Af Amer: >60 mL/min (ref >60)
GFR calc non Af Amer: >60 mL/min (ref >60)
Glucose, Bld: 304 mg/dL — ABNORMAL HIGH (ref 70–99)
Potassium: 3.8 mmol/L (ref 3.5–5.1)
Sodium: 139 mmol/L (ref 135–145)

## 2020-04-21 LAB — CBC
HCT: 33.3 % — ABNORMAL LOW (ref 39.0–52.0)
Hemoglobin: 10.9 g/dL — ABNORMAL LOW (ref 13.0–17.0)
MCH: 30.5 pg (ref 26.0–34.0)
MCHC: 32.7 g/dL (ref 30.0–36.0)
MCV: 93.3 fL (ref 80.0–100.0)
Platelets: 321 10*3/uL (ref 150–400)
RBC: 3.57 MIL/uL — ABNORMAL LOW (ref 4.22–5.81)
RDW: 16.1 % — ABNORMAL HIGH (ref 11.5–15.5)
WBC: 7.1 10*3/uL (ref 4.0–10.5)
nRBC: 0 % (ref 0.0–0.2)

## 2020-04-21 LAB — AEROBIC/ANAEROBIC CULTURE W GRAM STAIN (SURGICAL/DEEP WOUND)
Culture: NO GROWTH
Culture: NO GROWTH
Culture: NO GROWTH
Gram Stain: NONE SEEN
Gram Stain: NONE SEEN
Gram Stain: NONE SEEN

## 2020-04-21 LAB — GLUCOSE, CAPILLARY
Glucose-Capillary: 285 mg/dL — ABNORMAL HIGH (ref 70–99)
Glucose-Capillary: 260 mg/dL — ABNORMAL HIGH (ref 70–99)
Glucose-Capillary: 170 mg/dL — ABNORMAL HIGH (ref 70–99)
Glucose-Capillary: 86 mg/dL (ref 70–99)

## 2020-04-21 LAB — SEDIMENTATION RATE: Sed Rate: >140 mm/hr — ABNORMAL HIGH (ref 0–20)

## 2020-04-21 NOTE — Progress Notes (Signed)
Inpatient Diabetes Program Recommendations  AACE/ADA: New Consensus Statement on Inpatient Glycemic Control   Target Ranges:  Prepandial:   less than 140 mg/dL      Peak postprandial:   less than 180 mg/dL (1-2 hours)      Critically ill patients:  140 - 180 mg/dL   Results for Spencer [name], Spencer [name] (MRN [id number]) as of 04/21/2020 08:55  Ref. Range 04/20/2020 07:45 04/20/2020 11:51 04/20/2020 17:02 04/20/2020 21:13 04/21/2020 07:35  Glucose-Capillary Latest Ref Range: 70 - 99 mg/dL 97  Novolog 12 units  Lantus 32 units @ 10:53 204 (H)  Novolog 15 units 189 (H)  Novolog 14 units 139 (H) 285 (H)   Review of Glycemic Control  Current orders for Inpatient glycemic control: Lantus 32 units daily, Novolog 12 units TID with meals, Novolog 0-9 units TID with meals, Novolog 0-5 units QHS  Inpatient Diabetes Program Recommendations:   Insulin-Meal Coverage: Please consider increasing meal coverage to Novolog 13 units TID with meals if patient eats at least 50% of meals.  Thanks, Orlando Penner, RN, MSN, CDE Diabetes Coordinator Inpatient Diabetes Program [phone] (Team Pager from 8am to 5pm)

## 2020-04-21 NOTE — Progress Notes (Addendum)
PROGRESS NOTE    Spencer HakimRoger A Fulcher   ZOX:[id number]  DOB: 11/21/1960  PCP: No primary care provider on file.    DOA: 03/13/2020 LOS: 39    Patient denies any complaints. He has some neck pain. He reports he wears collar when he is out of bed.  Assessment & Plan    59 year old male with history of uncontrolled type 2 diabetes mellitus, cocaine use, brain aneurysm, hypertension, GERD recently hospitalized on 4/28 for DKA, MSSA bacteremia and AKI but signed out AMA on 5/5 was brought by EMS with hyperglycemia and encephalopathy.      MSSA bacteremia, persistent Patient left AMA during last hospital stay.  Blood culture positive on 4/29 and (5/8) positive for MSSA.  2D echo unremarkable.  Repeat blood culture on 5/10 and 5/27, negative. --he will need at least 4 weeks of IV nafcillin and rifampin from 6/11 (from last washout surgery) -deep surgical Cultures from 6/11--so far negative --Needs to be treated for full course of antibiotic in the hospital.  Alternative would be if any area local SNF is able to manage him for IV antibiotic  (barrier could be his payer source/Medicaid) -TOC team working on it -Completed course of prednisone taper on 6/12.  - considering worsening left upper extremity symptoms on 6/6 he underwent MRI of neck which showed fracture of C1 as well and osteomyelitis at C4-C5 and T2-T3 compared with previous MRI. He has progressed in spite of appropriate antibiotic for the staph aureus, after discussion with ID and neurosurgery on 6/9, s/p washout and removal of implant /hardware on 6/11 - pain meds prn (oxycodone 10 mg Q 4 hrs prn) -cont to wear neck collar  Acute pulmonary edema - resolved Unclear etiology.  2D echo from 5/8 with normal EF and no valvular abnormality. Repeated 2D echo on 6/1 which did not show any new valvular issues, EF 55-60%.   --Monitor strict I's/O.  -9.8 L --As needed IV Lasix.    Minimally displaced C4 fracture and now new displaced fractures at  C1-2 Recommendation to wear Aspen collar at all times but patient does not wear it every time I have walked in the room Left upper extremity pain and weakness - has improved. -CT of the cervical spine along with MRI was performed 6/9 showing new displaced fractures at C1-2, with lateral displacement of the lateral masses of C1 relative to C2 [facility]([place] fracture) and a fracture of the tip of the odontoid process. Progressive changes of diskitis and osteomyelitis at C4-5 and T2-3.  --continue low dose gabapentin   Cervical and thoracic spine osteomyelitis -Seen on MRI of the cervical spine from 5/27 (done with concern for myositis on MRI of the left shoulder).  Showed progressive perivertebral soft tissue swelling with concern for osteomyelitis of C1-C5, T2 and T3 vertebral bodies.  Also has ventral epidural phlegmon extending from C2-C5 and dorsal epidural phlegmon extending from C3-C4-6 6-C7, without discrete epidural abscess, high-grade stenosis or cord signal abnormality.  Also has an acute to subacute mild T3 superior endplate compression fracture. --ID recommends total 4 weeks pf  IV antibiotic from 6/11 depending on the culture result  --which are so far negative - s/p Anterior cervical washout and debridement with removal of Anterior cervical instrumentation at C5-7 & C4-5 disc biopsy by Neurosurgery on 6/11 - Drain removed on 6/13 - mobilize prn with brace - Maintain ASPEN collar at all times.  Diabetes mellitus type 2, poorly controlled with hyper and hypoglycemia -A1c of 12.6.  Presented  with recurrent DKA which has resolved.  - Currently on Lantus with Premeal aspart and sliding scale coverage.  Needs close CBG monitoring.  Essential hypertension - Continue amlodipine and losartan. PRN hydralazine.    Ongoing cocaine use Urine drug screen positive this admission.  He reportedly was getting agitated past 2 days and per sister he was supposed to receive paycheck with which he could  buy some drugs. -  On as needed clonazepam and Haldol.  Acute kidney injury Prerenal secondary to DKA.  Resolved.  Issues with decision-making capacity Seen by psychiatry multiple times this admission and has capacity.   Cannot be involuntarily committed if he decides to leave AMA.  Depression/Anxiety Continue Klonopin, Cymbalta, Zyprexa  GERD Continue Pepcid   Patient BMI: Body mass index is 22.24 kg/m.   DVT prophylaxis: enoxaparin (LOVENOX) injection 40 mg Start: 04/20/20 2200 SCDs Start: 03/13/20 1848     Code Status: Full Code     Status is: Inpatient  Remains inpatient appropriate because:IV treatments appropriate due to intensity of illness or inability to take PO   Dispo: The patient is from: Home              Anticipated d/c is to: Home              Anticipated d/c date is: > 3 days (planned final day abx 6/21)              Patient currently is not medically stable to d/c.needs to finish IV Abx while inpt. ID recommends total 4 weeks off IV antibiotic from 6/11 depending on the culture result.  I have asked TOC (on 6/13) to look into alternative placement like LTAC or local nursing facility who is willing to take him for IV antibiotics   Family Communication: Discussed with patient today   Consults, Procedures, Significant Events   Consultants:   Neurosurgery  Infectious Disease  Cardiology  Procedures:   MRI Cervical Spine  2D Echo  Antimicrobials:   IV Nafcillin  5/21 >>    Objective   Vitals:   04/20/20 1157 04/20/20 2115 04/21/20 0547 04/21/20 1156  BP: (!) 176/103 (!) 161/101 (!) 163/101 (!) 162/103  Pulse: 89 98 92 98  Resp:  20 20 20   Temp: 97.6 F (36.4 C) 98.6 F (37 C) 98.4 F (36.9 C) 98 F (36.7 C)  TempSrc: Oral Oral Oral Oral  SpO2: 99% 97% 96% 98%  Weight:      Height:        Intake/Output Summary (Last 24 hours) at 04/21/2020 1603 Last data filed at 04/21/2020 1427 Gross per 24 hour  Intake 1918.17 ml  Output  2350 ml  Net -431.83 ml   Filed Weights   03/13/20 1654 03/13/20 2055 04/01/20 2230  Weight: 59 kg 60.3 kg 70.3 kg    Physical Exam:  General exam: awake, alert, no acute distress Respiratory system: CTAB, no wheezes, rales or rhonchi, normal respiratory effort. Cardiovascular system: normal S1/S2, RRR, no pedal edema.   Neurologic: left upper extremity weak, unable to keep raised against gravity, intact grip strength.  Other extremities with motor intact.  No spasticity noted. Extremities: pain with passive ROM in left shoulder flexion and abduction, no peripheral edema   Labs   Data Reviewed: I have personally reviewed following labs and imaging studies  CBC: Recent Labs  Lab 04/17/20 0415 04/18/20 0544 04/21/20 0410  WBC 11.8* 9.3 7.1  HGB 10.6* 10.4* 10.9*  HCT 33.4* 31.2* 33.3*  MCV  95.2 93.1 93.3  PLT 323 283 321   Basic Metabolic Panel: Recent Labs  Lab 04/17/20 0415 04/18/20 0544 04/21/20 0410  NA 142 140 139  K 3.5 3.6 3.8  CL 108 105 107  CO2 26 28 26   GLUCOSE 139* 116* 304*  BUN 27* 23 21  CREATININE 0.85 0.70 0.77  CALCIUM 8.4* 8.1* 8.0*   GFR: Estimated Creatinine Clearance: 96.4 mL/min (by C-G formula based on SCr of 0.77 mg/dL). Liver Function Tests: No results for input(s): AST, ALT, ALKPHOS, BILITOT, PROT, ALBUMIN in the last 168 hours. No results for input(s): LIPASE, AMYLASE in the last 168 hours. No results for input(s): AMMONIA in the last 168 hours. Coagulation Profile: No results for input(s): INR, PROTIME in the last 168 hours. Cardiac Enzymes: No results for input(s): CKTOTAL, CKMB, CKMBINDEX, TROPONINI in the last 168 hours. BNP (last 3 results) No results for input(s): PROBNP in the last 8760 hours. HbA1C: No results for input(s): HGBA1C in the last 72 hours. CBG: Recent Labs  Lab 04/20/20 1151 04/20/20 1702 04/20/20 2113 04/21/20 0735 04/21/20 1155  GLUCAP 204* 189* 139* 285* 260*   Lipid Profile: No results for  input(s): CHOL, HDL, LDLCALC, TRIG, CHOLHDL, LDLDIRECT in the last 72 hours. Thyroid Function Tests: No results for input(s): TSH, T4TOTAL, FREET4, T3FREE, THYROIDAB in the last 72 hours. Anemia Panel: No results for input(s): VITAMINB12, FOLATE, FERRITIN, TIBC, IRON, RETICCTPCT in the last 72 hours. Sepsis Labs: No results for input(s): PROCALCITON, LATICACIDVEN in the last 168 hours.  Recent Results (from the past 240 hour(s))  CULTURE, BLOOD (ROUTINE X 2) w Reflex to ID Panel     Status: None   Collection Time: 04/14/20  9:12 PM   Specimen: Left Antecubital; Blood  Result Value Ref Range Status   Specimen Description LEFT ANTECUBITAL  Final   Special Requests   Final    BOTTLES DRAWN AEROBIC AND ANAEROBIC Blood Culture adequate volume   Culture   Final    NO GROWTH 5 DAYS Performed at [facility], [address] [place], [place]    Report Status 04/19/2020 FINAL  Final  CULTURE, BLOOD (ROUTINE X 2) w Reflex to ID Panel     Status: Abnormal   Collection Time: 04/14/20  9:22 PM   Specimen: BLOOD  Result Value Ref Range Status   Specimen Description   Final    BLOOD BLOOD LEFT HAND Performed at [facility], [address] [place], [place]    Special Requests   Final    BOTTLES DRAWN AEROBIC AND ANAEROBIC Blood Culture adequate volume Performed at [facility], [address] [place], [place]    Culture  Setup Time   Final    GRAM POSITIVE COCCI AEROBIC BOTTLE ONLY CRITICAL RESULT CALLED TO, READ BACK BY AND VERIFIED WITH: JASON ROBBINS AT 1808 04/15/20 BY ACR.PMF    Culture (A)  Final    STAPHYLOCOCCUS SPECIES (COAGULASE NEGATIVE) THE SIGNIFICANCE OF ISOLATING THIS ORGANISM FROM A SINGLE SET OF BLOOD CULTURES WHEN MULTIPLE SETS ARE DRAWN IS UNCERTAIN. PLEASE NOTIFY THE MICROBIOLOGY DEPARTMENT WITHIN ONE WEEK IF SPECIATION AND SENSITIVITIES ARE REQUIRED. Performed at [facility], [address]  [place], [place]    Report Status 04/17/2020 FINAL  Final  Blood Culture ID Panel (Reflexed)     Status: Abnormal   Collection Time: 04/14/20  9:23 PM  Result Value Ref Range Status   Enterococcus species NOT DETECTED NOT  DETECTED Final   Listeria monocytogenes NOT DETECTED NOT DETECTED Final   Staphylococcus species DETECTED (A) NOT DETECTED Final    Comment: Methicillin (oxacillin) resistant coagulase negative staphylococcus. Possible blood culture contaminant (unless isolated from more than one blood culture draw or clinical case suggests pathogenicity). No antibiotic treatment is indicated for blood  culture contaminants. CRITICAL RESULT CALLED TO, READ BACK BY AND VERIFIED WITH: JASON ROBBINS 04/15/20 @1808  BY ACR    Staphylococcus aureus (BCID) NOT DETECTED NOT DETECTED Final   Methicillin resistance DETECTED (A) NOT DETECTED Final    Comment: CRITICAL RESULT CALLED TO, READ BACK BY AND VERIFIED WITH: JASON ROBBINS 04/15/20 @1808  BY ACR    Streptococcus species NOT DETECTED NOT DETECTED Final   Streptococcus agalactiae NOT DETECTED NOT DETECTED Final   Streptococcus pneumoniae NOT DETECTED NOT DETECTED Final   Streptococcus pyogenes NOT DETECTED NOT DETECTED Final   Acinetobacter baumannii NOT DETECTED NOT DETECTED Final   Enterobacteriaceae species NOT DETECTED NOT DETECTED Final   Enterobacter cloacae complex NOT DETECTED NOT DETECTED Final   Escherichia coli NOT DETECTED NOT DETECTED Final   Klebsiella oxytoca NOT DETECTED NOT DETECTED Final   Klebsiella pneumoniae NOT DETECTED NOT DETECTED Final   Proteus species NOT DETECTED NOT DETECTED Final   Serratia marcescens NOT DETECTED NOT DETECTED Final   Haemophilus influenzae NOT DETECTED NOT DETECTED Final   Neisseria meningitidis NOT DETECTED NOT DETECTED Final   Pseudomonas aeruginosa NOT DETECTED NOT DETECTED Final   Candida albicans NOT DETECTED NOT DETECTED Final   Candida glabrata NOT DETECTED NOT DETECTED Final    Candida krusei NOT DETECTED NOT DETECTED Final   Candida parapsilosis NOT DETECTED NOT DETECTED Final   Candida tropicalis NOT DETECTED NOT DETECTED Final    Comment: Performed at [facility], [address] [place], [address] [place]  Aerobic/Anaerobic Culture (surgical/deep wound)     Status: None   Collection Time: 04/16/20  8:22 AM   Specimen: PATH Cytology Misc. fluid; Body Fluid  Result Value Ref Range Status   Specimen Description   Final    WOUND Performed at [facility], [address], [place], [address] [place]    Special Requests   Final    NONE Performed at [facility], [address] [place], [address] [place]    Gram Stain NO WBC SEEN NO ORGANISMS SEEN   Final   Culture   Final    No growth aerobically or anaerobically. Performed at [facility], [address] [place], [address] [place]    Report Status 04/21/2020 FINAL  Final  Aerobic/Anaerobic Culture (surgical/deep wound)     Status: None   Collection Time: 04/16/20  8:22 AM   Specimen: PATH Cytology Misc. fluid; Body Fluid  Result Value Ref Range Status   Specimen Description   Final    WOUND Performed at [facility], [address] [place], [address] [place]    Special Requests   Final    NONE Performed at [facility], [address] [place], [address] [place]    Gram Stain NO WBC SEEN NO ORGANISMS SEEN   Final   Culture   Final    No growth aerobically or anaerobically. Performed at [facility], [address] [place], [address] [place]    Report Status 04/21/2020 FINAL  Final  Aerobic/Anaerobic Culture (surgical/deep wound)     Status: None   Collection Time: 04/16/20  8:22 AM   Specimen: PATH Bone biopsy;  Tissue  Result Value Ref Range Status   Specimen Description   Final    BONE Performed at [facility], [address], [place], [place] [address]    Special Requests   Final    NONE Performed at [facility], [place], [place] [address]    Gram Stain NO WBC SEEN NO ORGANISMS SEEN   Final   Culture   Final    No growth aerobically or anaerobically. Performed at [facility], [place] [address], [place], [place] [address]    Report Status 04/21/2020 FINAL  Final      Imaging Studies   No results found.   Medications   Scheduled Meds: . amLODipine  10 mg Oral Daily  . aspirin EC  81 mg Oral Daily  . chlorhexidine  15 mL Mouth Rinse BID  . Chlorhexidine Gluconate Cloth  6 each Topical Q0600  . clonazePAM  0.5 mg Oral BID  . DULoxetine  30 mg Oral Daily  . enoxaparin (LOVENOX) injection  40 mg Subcutaneous Q24H  . famotidine  20 mg Oral Daily  . feeding supplement (GLUCERNA SHAKE)  237 mL Oral BID BM  . furosemide  40 mg Oral BID  . gabapentin  300 mg Oral BID  . haloperidol  0.25 mg Oral BID  . insulin aspart  0-5 Units Subcutaneous QHS  . insulin aspart  0-9 Units Subcutaneous TID WC  . insulin aspart  12 Units Subcutaneous TID WC  . insulin glargine  32 Units Subcutaneous Daily  . losartan  100 mg Oral Daily  . mouth rinse  15 mL Mouth Rinse q12n4p  . methocarbamol  500 mg Oral TID  . multivitamin with minerals  1 tablet Oral Daily  . OLANZapine  5 mg Oral QHS  . polyethylene glycol  17 g Oral Daily  . rifampin  300 mg Oral Q12H   Continuous Infusions: . sodium chloride 20.8 mL/hr at 04/01/20 0938  . nafcillin (NAFCIL) continuous infusion 12 g (04/21/20 1019)       LOS: 39 days    Time spent: 20 minutes    Josselyne Onofrio,MD [facility]  04/21/2020, 4:03 PM

## 2020-04-21 NOTE — Progress Notes (Signed)
Nutrition Follow-up  DOCUMENTATION CODES:   Severe malnutrition in context of social or environmental circumstances  INTERVENTION:   Continue Glucerna Shake po BID, each supplement provides 220 kcal and 10 grams of protein. Patient prefers chocolate.  Continue double protein portions at meals.  Continue daily MVI.  NUTRITION DIAGNOSIS:   Severe Malnutrition related to social / environmental circumstances (hx substance abuse, inadequate oral intake) as evidenced by severe fat depletion, severe muscle depletion. Ongoing - addressing with nutrition interventions.  GOAL:   Patient will meet greater than or equal to 90% of their needs Met.  MONITOR:   PO intake, Supplement acceptance, Labs, Weight trends, I & O's, Skin  ASSESSMENT:   59 year old male with PMHx of HTN, substance abuse, DM, GERD, brain aneurysm admitted with DKA, MSSA bacteremia, AKI, metabolic encephalopathy.  Pt continues to have good appetite and oral intake; pt eating 100% of meals and drinking Glucerna supplements. No new weight since 5/27; pt is ordered for weekly weights.     Medications reviewed and include: aspirin, lovenox, pepcid, lasix, insulin, MVI, miralax,  nafcillin   Labs reviewed:  Hgb 10.9(L), Hct 33.3(L) cbgs- 285, 260 x 24 hrs  Diet Order:   Diet Order            DIET SOFT Room service appropriate? Yes; Fluid consistency: Thin  Diet effective now                EDUCATION NEEDS:   Not appropriate for education at this time  Skin:  Skin Assessment: Skin Integrity Issues: (Stage 2 right ischial tuberosity; unstageable sacrum (1cm x 1.8cm); unstageable right elbow (2cm x 1.5cm x 0.1cm))  Last BM:  6/15  Height:   Ht Readings from Last 1 Encounters:  03/13/20 5' 10" (1.778 m)   Weight:   Wt Readings from Last 1 Encounters:  04/01/20 70.3 kg   Ideal Body Weight:  75.5 kg  BMI:  Body mass index is 22.24 kg/m.  Estimated Nutritional Needs:   Kcal:  1800-2100  Protein:   90-100 grams  Fluid:  1.8-2 L/day    MS, RD, LDN Please refer to AMION for RD and/or RD on-call/weekend/after hours pager 

## 2020-04-22 LAB — GLUCOSE, CAPILLARY
Glucose-Capillary: 70 mg/dL (ref 70–99)
Glucose-Capillary: 193 mg/dL — ABNORMAL HIGH (ref 70–99)
Glucose-Capillary: 61 mg/dL — ABNORMAL LOW (ref 70–99)
Glucose-Capillary: 69 mg/dL — ABNORMAL LOW (ref 70–99)
Glucose-Capillary: 99 mg/dL (ref 70–99)
Glucose-Capillary: 403 mg/dL — ABNORMAL HIGH (ref 70–99)

## 2020-04-22 LAB — SEDIMENTATION RATE: Sed Rate: 71 mm/hr — ABNORMAL HIGH (ref 0–20)

## 2020-04-22 LAB — C-REACTIVE PROTEIN
CRP: 5 mg/dL — ABNORMAL HIGH (ref <1.0)
CRP: 4.2 mg/dL — ABNORMAL HIGH (ref <1.0)

## 2020-04-22 NOTE — Plan of Care (Signed)
  Problem: Education: Goal: Ability to describe self-care measures that may prevent or decrease complications (Diabetes Survival Skills Education) will improve Outcome: Progressing   Problem: Clinical Measurements: Goal: Ability to maintain clinical measurements within normal limits will improve Outcome: Progressing   Problem: Pain Managment: Goal: General experience of comfort will improve Outcome: Progressing   Problem: Safety: Goal: Ability to remain free from injury will improve Outcome: Progressing

## 2020-04-22 NOTE — Progress Notes (Signed)
Hypoglycemic Event  CBG: 99  Treatment: 4 oz juice/soda  Symptoms: None  Follow-up CBG: Time:1725 CBG Result:99  Possible Reasons for Event: Unknown  Comments/MD notified:yes    Louretta Parma [facility]

## 2020-04-22 NOTE — Progress Notes (Signed)
PROGRESS NOTE    Spencer [name]   DPO:[id number]  DOB: 1961-10-25  PCP: No primary care provider on file.    DOA: 03/13/2020 LOS: 40    Patient denies any complaints. He has some neck pain. He reports he wears collar when he is out of bed.  Assessment & Plan    59 year old male with history of uncontrolled type 2 diabetes mellitus, cocaine use, brain aneurysm, hypertension, GERD recently hospitalized on 4/28 for DKA, MSSA bacteremia and AKI but signed out AMA on 5/5 was brought by EMS with hyperglycemia and encephalopathy.      MSSA bacteremia, persistent Patient left AMA during last hospital stay.  Blood culture positive on 4/29 and (5/8) positive for MSSA.  2D echo unremarkable.  Repeat blood culture on 5/10 and 5/27, negative. --he will need at least 4 weeks of IV nafcillin and rifampin from 6/11 (from last washout surgery) till July 9th -deep surgical Cultures from 6/11--so far negative --Needs to be treated for full course of antibiotic in the hospital.  Alternative would be if any area local SNF is able to manage him for IV antibiotic  (barrier could be his payer source/Medicaid) -TOC team working on it -Completed course of prednisone taper on 6/12.  - considering worsening left upper extremity symptoms on 6/6 he underwent MRI of neck which showed fracture of C1 as well and osteomyelitis at C4-C5 and T2-T3 compared with previous MRI. He has progressed in spite of appropriate antibiotic for the staph aureus, after discussion with ID and neurosurgery on 6/9, s/p washout and removal of implant /hardware on 6/11 - pain meds prn (oxycodone 10 mg Q 4 hrs prn)  Acute pulmonary edema - resolved Unclear etiology.  2D echo from 5/8 with normal EF and no valvular abnormality. Repeated 2D echo on 6/1 which did not show any new valvular issues, EF 55-60%.   --Monitor strict I's/O.  -9.8 L --As needed IV Lasix.    Minimally displaced C4 fracture and now new displaced fractures at  C1-2 -Recommendation to wear Aspen collar at all times but patient does not wear it when he is in bed -CT of the cervical spine along with MRI was performed 6/9 showing new displaced fractures at C1-2, with lateral displacement of the lateral masses of C1 relative to C2 [facility] fracture) and a fracture of the tip of the odontoid process. Progressive changes of diskitis and osteomyelitis at C4-5 and T2-3.  --continue low dose gabapentin   Cervical and thoracic spine osteomyelitis -Seen on MRI of the cervical spine from 5/27 (done with concern for myositis on MRI of the left shoulder).  Showed progressive perivertebral soft tissue swelling with concern for osteomyelitis of C1-C5, T2 and T3 vertebral bodies.  Also has ventral epidural phlegmon extending from C2-C5 and dorsal epidural phlegmon extending from C3-C4-6 6-C7, without discrete epidural abscess, high-grade stenosis or cord signal abnormality.  Also has an acute to subacute mild T3 superior endplate compression fracture. --ID recommends total 4 weeks pf  IV antibiotic from 6/11 depending on the culture result  --which are so far negative till July 9th - s/p Anterior cervical washout and debridement with removal of Anterior cervical instrumentation at C5-7 & C4-5 disc biopsy by Neurosurgery on 6/11 - Drain removed on 6/13 - mobilize prn with brace - Maintain ASPEN collar at all times.  Diabetes mellitus type 2, poorly controlled with hyper and hypoglycemia -A1c of 12.6.  Presented with recurrent DKA which has resolved.  - Currently on Lantus  with Premeal aspart and sliding scale coverage.  Needs close CBG monitoring.  Essential hypertension - Continue amlodipine and losartan. PRN hydralazine.    H/o Ongoing cocaine use Urine drug screen positive this admission.  He reportedly was getting agitated past 2 days and per sister he was supposed to receive paycheck with which he could buy some drugs. -  On as needed clonazepam and  Haldol.  Acute kidney injury Prerenal secondary to DKA.  Resolved.  Issues with decision-making capacity Seen by psychiatry multiple times this admission and has capacity.   Cannot be involuntarily committed if he decides to leave AMA.  Depression/Anxiety Continue Klonopin, Cymbalta, Zyprexa  GERD Continue Pepcid   Patient BMI: Body mass index is 22.24 kg/m.   DVT prophylaxis: enoxaparin (LOVENOX) injection 40 mg Start: 04/20/20 2200 SCDs Start: 03/13/20 1848     Code Status: Full Code     Status is: Inpatient  Remains inpatient appropriate because: pt needs to completed IV abxs--d/c barrier drug abuse, medicaid--no bed offers so far   Dispo: The patient is from: Home              Anticipated d/c is to: Home              Anticipated d/c date is: > 3 days (planned final day abx 6/21)              Patient currently- is stbale for d/c but needs to finish IV Abx while inpt. ID recommends total 4 weeks off IV antibiotic from 6/11 depending on the culture result.   Per TOC no offers yet     Consults, Procedures, Significant Events   Consultants:   Neurosurgery  Infectious Disease  Cardiology  Procedures:   MRI Cervical Spine  2D Echo  Antimicrobials:   IV Nafcillin  5/21 >>    Objective   Vitals:   04/21/20 1156 04/21/20 2022 04/22/20 0418 04/22/20 1207  BP: (!) 162/103 140/89 (!) 160/102 138/85  Pulse: 98 89 88 92  Resp: 20 20 20 14   Temp: 98 F (36.7 C) 98.4 F (36.9 C) 97.9 F (36.6 C) 98.1 F (36.7 C)  TempSrc: Oral Oral Oral   SpO2: 98% 100% 98% 98%  Weight:      Height:        Intake/Output Summary (Last 24 hours) at 04/22/2020 1442 Last data filed at 04/22/2020 1300 Gross per 24 hour  Intake 1080 ml  Output 3305 ml  Net -2225 ml   Filed Weights   03/13/20 1654 03/13/20 2055 04/01/20 2230  Weight: 59 kg 60.3 kg 70.3 kg    Physical Exam:  General exam: awake, alert, no acute distress Respiratory system: CTAB, no wheezes, rales  or rhonchi, normal respiratory effort. Cardiovascular system: normal S1/S2, RRR, no pedal edema.   Neurologic: left upper extremity weak, unable to keep raised against gravity, intact grip strength.  Other extremities with motor intact.  No spasticity noted. Extremities: pain with passive ROM in left shoulder flexion and abduction, no peripheral edema   Labs   Data Reviewed: I have personally reviewed following labs and imaging studies  CBC: Recent Labs  Lab 04/17/20 0415 04/18/20 0544 04/21/20 0410  WBC 11.8* 9.3 7.1  HGB 10.6* 10.4* 10.9*  HCT 33.4* 31.2* 33.3*  MCV 95.2 93.1 93.3  PLT 323 283 621   Basic Metabolic Panel: Recent Labs  Lab 04/17/20 0415 04/18/20 0544 04/21/20 0410  NA 142 140 139  K 3.5 3.6 3.8  CL 108  105 107  CO2 26 28 26   GLUCOSE 139* 116* 304*  BUN 27* 23 21  CREATININE 0.85 0.70 0.77  CALCIUM 8.4* 8.1* 8.0*   GFR: Estimated Creatinine Clearance: 96.4 mL/min (by C-G formula based on SCr of 0.77 mg/dL). Liver Function Tests: Recent Labs  Lab 04/21/20 1641  AST 17  ALT 19  ALKPHOS 88  BILITOT 1.0  PROT 5.4*  ALBUMIN 2.0*   No results for input(s): LIPASE, AMYLASE in the last 168 hours. No results for input(s): AMMONIA in the last 168 hours. Coagulation Profile: No results for input(s): INR, PROTIME in the last 168 hours. Cardiac Enzymes: No results for input(s): CKTOTAL, CKMB, CKMBINDEX, TROPONINI in the last 168 hours. BNP (last 3 results) No results for input(s): PROBNP in the last 8760 hours. HbA1C: No results for input(s): HGBA1C in the last 72 hours. CBG: Recent Labs  Lab 04/21/20 1155 04/21/20 1645 04/21/20 2130 04/22/20 0758 04/22/20 1205  GLUCAP 260* 170* 86 70 193*   Lipid Profile: No results for input(s): CHOL, HDL, LDLCALC, TRIG, CHOLHDL, LDLDIRECT in the last 72 hours. Thyroid Function Tests: No results for input(s): TSH, T4TOTAL, FREET4, T3FREE, THYROIDAB in the last 72 hours. Anemia Panel: No results for  input(s): VITAMINB12, FOLATE, FERRITIN, TIBC, IRON, RETICCTPCT in the last 72 hours. Sepsis Labs: No results for input(s): PROCALCITON, LATICACIDVEN in the last 168 hours.  Recent Results (from the past 240 hour(s))  CULTURE, BLOOD (ROUTINE X 2) w Reflex to ID Panel     Status: None   Collection Time: 04/14/20  9:12 PM   Specimen: Left Antecubital; Blood  Result Value Ref Range Status   Specimen Description LEFT ANTECUBITAL  Final   Special Requests   Final    BOTTLES DRAWN AEROBIC AND ANAEROBIC Blood Culture adequate volume   Culture   Final    NO GROWTH 5 DAYS Performed at [facility], [address] [place], [place]    Report Status 04/19/2020 FINAL  Final  CULTURE, BLOOD (ROUTINE X 2) w Reflex to ID Panel     Status: Abnormal   Collection Time: 04/14/20  9:22 PM   Specimen: BLOOD  Result Value Ref Range Status   Specimen Description   Final    BLOOD BLOOD LEFT HAND Performed at [facility], [address], [place], [place]    Special Requests   Final    BOTTLES DRAWN AEROBIC AND ANAEROBIC Blood Culture adequate volume Performed at [facility], [address] [place], [place]    Culture  Setup Time   Final    GRAM POSITIVE COCCI AEROBIC BOTTLE ONLY CRITICAL RESULT CALLED TO, READ BACK BY AND VERIFIED WITH: JASON ROBBINS AT 1808 04/15/20 BY ACR.PMF    Culture (A)  Final    STAPHYLOCOCCUS SPECIES (COAGULASE NEGATIVE) THE SIGNIFICANCE OF ISOLATING THIS ORGANISM FROM A SINGLE SET OF BLOOD CULTURES WHEN MULTIPLE SETS ARE DRAWN IS UNCERTAIN. PLEASE NOTIFY THE MICROBIOLOGY DEPARTMENT WITHIN ONE WEEK IF SPECIATION AND SENSITIVITIES ARE REQUIRED. Performed at [facility], [address], [place], [place]    Report Status 04/17/2020 FINAL  Final  Blood Culture ID Panel (Reflexed)     Status: Abnormal   Collection Time: 04/14/20  9:23 PM  Result Value Ref Range Status   Enterococcus species NOT  DETECTED NOT DETECTED Final   Listeria monocytogenes NOT DETECTED NOT DETECTED Final   Staphylococcus species DETECTED (A) NOT DETECTED Final    Comment: Methicillin (  oxacillin) resistant coagulase negative staphylococcus. Possible blood culture contaminant (unless isolated from more than one blood culture draw or clinical case suggests pathogenicity). No antibiotic treatment is indicated for blood  culture contaminants. CRITICAL RESULT CALLED TO, READ BACK BY AND VERIFIED WITH: JASON ROBBINS 04/15/20 @1808  BY ACR    Staphylococcus aureus (BCID) NOT DETECTED NOT DETECTED Final   Methicillin resistance DETECTED (A) NOT DETECTED Final    Comment: CRITICAL RESULT CALLED TO, READ BACK BY AND VERIFIED WITH: JASON ROBBINS 04/15/20 @1808  BY ACR    Streptococcus species NOT DETECTED NOT DETECTED Final   Streptococcus agalactiae NOT DETECTED NOT DETECTED Final   Streptococcus pneumoniae NOT DETECTED NOT DETECTED Final   Streptococcus pyogenes NOT DETECTED NOT DETECTED Final   Acinetobacter baumannii NOT DETECTED NOT DETECTED Final   Enterobacteriaceae species NOT DETECTED NOT DETECTED Final   Enterobacter cloacae complex NOT DETECTED NOT DETECTED Final   Escherichia coli NOT DETECTED NOT DETECTED Final   Klebsiella oxytoca NOT DETECTED NOT DETECTED Final   Klebsiella pneumoniae NOT DETECTED NOT DETECTED Final   Proteus species NOT DETECTED NOT DETECTED Final   Serratia marcescens NOT DETECTED NOT DETECTED Final   Haemophilus influenzae NOT DETECTED NOT DETECTED Final   Neisseria meningitidis NOT DETECTED NOT DETECTED Final   Pseudomonas aeruginosa NOT DETECTED NOT DETECTED Final   Candida albicans NOT DETECTED NOT DETECTED Final   Candida glabrata NOT DETECTED NOT DETECTED Final   Candida krusei NOT DETECTED NOT DETECTED Final   Candida parapsilosis NOT DETECTED NOT DETECTED Final   Candida tropicalis NOT DETECTED NOT DETECTED Final    Comment: Performed at [facility], [address] [place], [place] [address]  Aerobic/Anaerobic Culture (surgical/deep wound)     Status: None   Collection Time: 04/16/20  8:22 AM   Specimen: PATH Cytology Misc. fluid; Body Fluid  Result Value Ref Range Status   Specimen Description   Final    WOUND Performed at [facility], [address] [place], [place] [address]    Special Requests   Final    NONE Performed at [facility], [address] [place], [place] [address]    Gram Stain NO WBC SEEN NO ORGANISMS SEEN   Final   Culture   Final    No growth aerobically or anaerobically. Performed at [facility], [address] [place], [place] [address]    Report Status 04/21/2020 FINAL  Final  Aerobic/Anaerobic Culture (surgical/deep wound)     Status: None   Collection Time: 04/16/20  8:22 AM   Specimen: PATH Cytology Misc. fluid; Body Fluid  Result Value Ref Range Status   Specimen Description   Final    WOUND Performed at [facility], [address] [place], [place] [address]    Special Requests   Final    NONE Performed at [facility], [address] [place], [place] [address]    Gram Stain NO WBC SEEN NO ORGANISMS SEEN   Final   Culture   Final    No growth aerobically or anaerobically. Performed at [facility], [address] [place], [place] [address]    Report Status 04/21/2020 FINAL  Final  Aerobic/Anaerobic Culture (surgical/deep wound)     Status: None   Collection Time: 04/16/20  8:22 AM   Specimen: PATH Bone biopsy; Tissue  Result Value Ref Range Status   Specimen Description   Final    BONE Performed at [facility], [address], [place], [place] [address]    Special Requests   Final    NONE Performed at [facility], [address] [place], [place] [address]    Gram Stain NO WBC SEEN NO ORGANISMS SEEN   Final   Culture   Final    No growth aerobically or anaerobically. Performed at [facility],  [address] [place], [place] [address]    Report Status 04/21/2020 FINAL  Final      Imaging Studies   No results found.   Medications   Scheduled Meds: . amLODipine  10 mg Oral Daily  . aspirin EC  81 mg Oral Daily  . chlorhexidine  15 mL Mouth Rinse BID  . Chlorhexidine Gluconate Cloth  6 each Topical Q0600  . clonazePAM  0.5 mg Oral BID  . DULoxetine  30 mg Oral Daily  . enoxaparin (LOVENOX) injection  40 mg Subcutaneous Q24H  . famotidine  20 mg Oral Daily  . feeding supplement (GLUCERNA SHAKE)  237 mL Oral BID BM  . furosemide  40 mg Oral BID  . gabapentin  300 mg Oral BID  . haloperidol  0.25 mg Oral BID  . insulin aspart  0-5 Units Subcutaneous QHS  . insulin aspart  0-9 Units Subcutaneous TID WC  . insulin aspart  12 Units Subcutaneous TID WC  . insulin glargine  32 Units Subcutaneous Daily  . losartan  100 mg Oral Daily  . mouth rinse  15 mL Mouth Rinse q12n4p  . methocarbamol  500 mg Oral TID  . multivitamin with minerals  1 tablet Oral Daily  . OLANZapine  5 mg Oral QHS  . polyethylene glycol  17 g Oral Daily  . rifampin  300 mg Oral Q12H   Continuous Infusions: . sodium chloride 20.8 mL/hr at 04/01/20 0938  . nafcillin (NAFCIL) continuous infusion 12 g (04/22/20 1244)       LOS: 40 days    Time spent: 20 minutes    Dilana Mcphie,MD [facility]  04/22/2020, 2:42 PM

## 2020-04-22 NOTE — Progress Notes (Signed)
Cross Cover Brief Note Due to hypoglycemic event, long cting insulin hel. Treating with current SSI,  Asked to have recheck of cbg at 0200

## 2020-04-22 NOTE — Progress Notes (Signed)
Hypoglycemic Event  CBG: 61  Treatment: 4 oz juice/soda  Symptoms: None  Follow-up CBG: Time:1706 CBG Result:69  Possible Reasons for Event: Unknown  Comments/MD notified:yes    Louretta Parma [facility]

## 2020-04-23 LAB — GLUCOSE, CAPILLARY
Glucose-Capillary: 230 mg/dL — ABNORMAL HIGH (ref 70–99)
Glucose-Capillary: 90 mg/dL (ref 70–99)
Glucose-Capillary: 140 mg/dL — ABNORMAL HIGH (ref 70–99)
Glucose-Capillary: 97 mg/dL (ref 70–99)
Glucose-Capillary: 409 mg/dL — ABNORMAL HIGH (ref 70–99)
Glucose-Capillary: 437 mg/dL — ABNORMAL HIGH (ref 70–99)
Glucose-Capillary: 257 mg/dL — ABNORMAL HIGH (ref 70–99)

## 2020-04-23 MED ORDER — INSULIN ASPART 100 UNIT/ML ~~LOC~~ SOLN
10.0000 [IU] | Freq: Three times a day (TID) | SUBCUTANEOUS | Status: DC
  Administered 2020-04-23 – 2020-04-24 (×4): 10 [IU] via SUBCUTANEOUS
  Filled 2020-04-23 (×4): qty 1

## 2020-04-23 MED ORDER — INSULIN ASPART 100 UNIT/ML ~~LOC~~ SOLN
10.0000 [IU] | Freq: Once | SUBCUTANEOUS | Status: AC
  Administered 2020-04-23: 10 [IU] via SUBCUTANEOUS
  Filled 2020-04-23: qty 1

## 2020-04-23 MED ORDER — INSULIN GLARGINE 100 UNIT/ML ~~LOC~~ SOLN
30.0000 [IU] | Freq: Every day | SUBCUTANEOUS | Status: DC
  Administered 2020-04-24: 30 [IU] via SUBCUTANEOUS
  Filled 2020-04-23 (×2): qty 0.3

## 2020-04-23 NOTE — TOC Progression Note (Signed)
Transition of Care [facility]) - Progression Note    Patient Details  Name: NAVDEEP HALT MRN: [id number] Date of Birth: 1960-12-09  [facility]) CM/SW Contact  Chapman Fitch, RN Phone Number: 04/23/2020, 4:52 PM  Clinical Narrative:    Still no SNF bed available    Expected Discharge Plan: Home/Self Care Barriers to Discharge: Continued Medical Work up  Expected Discharge Plan and Services Expected Discharge Plan: Home/Self Care In-house Referral: Clinical Social Work Discharge Planning Services: CM Consult   Living arrangements for the past 2 months: Single Family Home                 DME Arranged: Walker rolling DME Agency: [employer] Date DME Agency Contacted: 03/18/20 Time DME Agency Contacted: 1100 Representative spoke with at DME Agency: Zack             Social Determinants of Health (SDOH) Interventions    Readmission Risk Interventions Readmission Risk Prevention Plan 04/07/2020 03/05/2020 02/16/2020  Transportation Screening Complete Complete Complete  PCP or Specialist Appt within 3-5 Days - - -  HRI or Home Care Consult - - -  HRI or Home Care Consult comments - - -  Palliative Care Screening - - -  Medication Review (RN Care Manager) Complete Complete Complete  PCP or Specialist appointment within 3-5 days of discharge - Patient refused Complete  HRI or Home Care Consult - Complete Complete  SW Recovery Care/Counseling Consult - - -  SW Consult Not Complete Comments - - -  Palliative Care Screening Not Applicable - -  Skilled Nursing Facility Not Applicable Complete Complete  Some recent data might be hidden

## 2020-04-23 NOTE — Progress Notes (Signed)
Inpatient Diabetes Program Recommendations  AACE/ADA: New Consensus Statement on Inpatient Glycemic Control (2015)  Target Ranges:  Prepandial:   less than 140 mg/dL      Peak postprandial:   less than 180 mg/dL (1-2 hours)      Critically ill patients:  140 - 180 mg/dL   Lab Results  Component Value Date   GLUCAP 90 04/23/2020   HGBA1C 12.6 (H) 02/15/2020    Review of Glycemic Control Results for Spencer [name], Spencer [name] (MRN [id number]) as of 04/23/2020 10:51  Ref. Range 04/22/2020 07:58 04/22/2020 12:05 04/22/2020 16:47 04/22/2020 17:06 04/22/2020 17:25 04/22/2020 21:59 04/23/2020 02:29 04/23/2020 08:03  Glucose-Capillary Latest Ref Range: 70 - 99 mg/dL 70 037 (H) 61 (L) 69 (L) 99 403 (H) 230 (H) 90   Diabetes history: DM  Current orders for Inpatient glycemic control: Lantus 32 units daily, Novolog 12 units TID with meals, Novolog 0-9 units TID with meals, Novolog 0-5 units QHS  Inpatient Diabetes Program Recommendations:  May consider reducing Lantus to 30 units daily.  Also consider reducing Novolog meal coverage to 10 units tid with meals.   Thanks,  Beryl Meager, RN, BC-ADM Inpatient Diabetes Coordinator Pager [phone] (8a-5p)

## 2020-04-23 NOTE — Progress Notes (Signed)
PROGRESS NOTE    BONIFACE GOFFE   PYP:[id number]  DOB: 1961-01-31  PCP: No primary care provider on file.    DOA: 03/13/2020 LOS: 41    Patient denies any complaints. He has some neck pain. He reports he wears collar when he is out of bed. Some low sugars yday Assessment & Plan    59 year old male with history of uncontrolled type 2 diabetes mellitus, cocaine use, brain aneurysm, hypertension, GERD recently hospitalized on 4/28 for DKA, MSSA bacteremia and AKI but signed out AMA on 5/5 was brought by EMS with hyperglycemia and encephalopathy.      MSSA bacteremia, persistent Patient left AMA during last hospital stay.  Blood culture positive on 4/29 and (5/8) positive for MSSA.  2D echo unremarkable.  Repeat blood culture on 5/10 and 5/27, negative. --he will need at least 4 weeks of IV nafcillin and rifampin from 6/11 (from last washout surgery) till July 9th -deep surgical Cultures from 6/11--so far negative -Completed course of prednisone taper on 6/12.  - pain meds prn (oxycodone 10 mg Q 4 hrs prn)  Acute pulmonary edema - resolved Unclear etiology.  2D echo from 5/8 with normal EF and no valvular abnormality. Repeated 2D echo on 6/1 which did not show any new valvular issues, EF 55-60%.   --Monitor strict I's/O.  -9.8 L --As needed IV Lasix.    Minimally displaced C4 fracture and now new displaced fractures at C1-2 -Recommendation to wear Aspen collar at all times but patient does not wear it when he is in bed --continue low dose gabapentin   Cervical and thoracic spine osteomyelitis -Seen on MRI of the cervical spine from 5/27 (done with concern for myositis on MRI of the left shoulder).  Showed progressive perivertebral soft tissue swelling with concern for osteomyelitis of C1-C5, T2 and T3 vertebral bodies.  Also has ventral epidural phlegmon extending from C2-C5 and dorsal epidural phlegmon extending from C3-C4-6 6-C7, without discrete epidural abscess, high-grade stenosis  or cord signal abnormality.  Also has an acute to subacute mild T3 superior endplate compression fracture. --ID recommends total 4 weeks pf  IV antibiotic from 6/11 depending on the culture result  --which are so far negative till July 9th - s/p Anterior cervical washout and debridement with removal of Anterior cervical instrumentation at C5-7 & C4-5 disc biopsy by Neurosurgery on 6/11 - Drain removed on 6/13 - Maintain ASPEN collar at all times.  Diabetes mellitus type 2, poorly controlled with hyper and hypoglycemia -A1c of 12.6.  Presented with recurrent DKA which has resolved.  - Currently on Lantus with Premeal aspart and sliding scale coverage.  - Needs close CBG monitoring.  Essential hypertension - Continue amlodipine and losartan. PRN hydralazine.    H/o Ongoing cocaine use -Urine drug screen positive this admission.   - On as needed clonazepam   Acute kidney injury Prerenal secondary to DKA.  Resolved.  Issues with decision-making capacity Seen by psychiatry multiple times this admission and has capacity.   Cannot be involuntarily committed if he decides to leave AMA.  Depression/Anxiety Continue Klonopin, Cymbalta, Zyprexa  GERD Continue Pepcid   Patient BMI: Body mass index is 22.24 kg/m.   DVT prophylaxis: enoxaparin (LOVENOX) injection 40 mg Start: 04/20/20 2200 SCDs Start: 03/13/20 1848     Code Status: Full Code     Status is: Inpatient  Remains inpatient appropriate because: pt needs to completed IV abxs--d/c barrier drug abuse, medicaid--no bed offers so far   Dispo: The  patient is from: Home              Anticipated d/c is to: Home              Anticipated d/c date is: > 3 days (planned final day abx 7/9)              Patient currently- is stable for d/c but needs to finish IV Abx while inpt. ID recommends total 4 weeks off IV antibiotic from 6/11  Per TOC no offers yet barrier to discharge Medicaid and drug abuse     Consults, Procedures,  Significant Events   Consultants:   Neurosurgery  Infectious Disease  Cardiology  Procedures:   MRI Cervical Spine  2D Echo  Antimicrobials:   IV Nafcillin  5/21 >>    Objective   Vitals:   04/22/20 2156 04/23/20 0508 04/23/20 1200 04/23/20 1202  BP: (!) 164/98 (!) 153/100 (!) 153/107 (!) 171/99  Pulse: 87 91 93 92  Resp: 17 20 14    Temp: 97.7 F (36.5 C) 98.1 F (36.7 C) 98.5 F (36.9 C)   TempSrc: Oral  Oral   SpO2: 100% 99% 100%   Weight:      Height:        Intake/Output Summary (Last 24 hours) at 04/23/2020 1343 Last data filed at 04/23/2020 1012 Gross per 24 hour  Intake 720 ml  Output 2850 ml  Net -2130 ml   Filed Weights   03/13/20 1654 03/13/20 2055 04/01/20 2230  Weight: 59 kg 60.3 kg 70.3 kg    Physical Exam:  General exam: awake, alert, no acute distress Respiratory system: CTAB, no wheezes, rales or rhonchi, normal respiratory effort. Cardiovascular system: normal S1/S2, RRR, no pedal edema.   Neurologic: left upper extremity weak, unable to keep raised against gravity, intact grip strength.  Other extremities with motor intact.  No spasticity noted. Extremities: pain with passive ROM in left shoulder flexion and abduction, no peripheral edema   Labs   Data Reviewed: I have personally reviewed following labs and imaging studies  CBC: Recent Labs  Lab 04/17/20 0415 04/18/20 0544 04/21/20 0410  WBC 11.8* 9.3 7.1  HGB 10.6* 10.4* 10.9*  HCT 33.4* 31.2* 33.3*  MCV 95.2 93.1 93.3  PLT 323 283 321   Basic Metabolic Panel: Recent Labs  Lab 04/17/20 0415 04/18/20 0544 04/21/20 0410  NA 142 140 139  K 3.5 3.6 3.8  CL 108 105 107  CO2 26 28 26   GLUCOSE 139* 116* 304*  BUN 27* 23 21  CREATININE 0.85 0.70 0.77  CALCIUM 8.4* 8.1* 8.0*   GFR: Estimated Creatinine Clearance: 96.4 mL/min (by C-G formula based on SCr of 0.77 mg/dL). Liver Function Tests: Recent Labs  Lab 04/21/20 1641  AST 17  ALT 19  ALKPHOS 88  BILITOT 1.0    PROT 5.4*  ALBUMIN 2.0*   No results for input(s): LIPASE, AMYLASE in the last 168 hours. No results for input(s): AMMONIA in the last 168 hours. Coagulation Profile: No results for input(s): INR, PROTIME in the last 168 hours. Cardiac Enzymes: No results for input(s): CKTOTAL, CKMB, CKMBINDEX, TROPONINI in the last 168 hours. BNP (last 3 results) No results for input(s): PROBNP in the last 8760 hours. HbA1C: No results for input(s): HGBA1C in the last 72 hours. CBG: Recent Labs  Lab 04/22/20 1725 04/22/20 2159 04/23/20 0229 04/23/20 0803 04/23/20 1158  GLUCAP 99 403* 230* 90 140*   Lipid Profile: No results for input(s): CHOL,  HDL, LDLCALC, TRIG, CHOLHDL, LDLDIRECT in the last 72 hours. Thyroid Function Tests: No results for input(s): TSH, T4TOTAL, FREET4, T3FREE, THYROIDAB in the last 72 hours. Anemia Panel: No results for input(s): VITAMINB12, FOLATE, FERRITIN, TIBC, IRON, RETICCTPCT in the last 72 hours. Sepsis Labs: No results for input(s): PROCALCITON, LATICACIDVEN in the last 168 hours.  Recent Results (from the past 240 hour(s))  CULTURE, BLOOD (ROUTINE X 2) w Reflex to ID Panel     Status: None   Collection Time: 04/14/20  9:12 PM   Specimen: Left Antecubital; Blood  Result Value Ref Range Status   Specimen Description LEFT ANTECUBITAL  Final   Special Requests   Final    BOTTLES DRAWN AEROBIC AND ANAEROBIC Blood Culture adequate volume   Culture   Final    NO GROWTH 5 DAYS Performed at [facility], [address] [place], [place] [address]    Report Status 04/19/2020 FINAL  Final  CULTURE, BLOOD (ROUTINE X 2) w Reflex to ID Panel     Status: Abnormal   Collection Time: 04/14/20  9:22 PM   Specimen: BLOOD  Result Value Ref Range Status   Specimen Description   Final    BLOOD BLOOD LEFT HAND Performed at [facility], [address] [place], [place] [address]    Special Requests   Final    BOTTLES DRAWN AEROBIC AND ANAEROBIC Blood  Culture adequate volume Performed at [facility], [address] [place], [place] [address]    Culture  Setup Time   Final    GRAM POSITIVE COCCI AEROBIC BOTTLE ONLY CRITICAL RESULT CALLED TO, READ BACK BY AND VERIFIED WITH: JASON ROBBINS AT 1808 04/15/20 BY ACR.PMF    Culture (A)  Final    STAPHYLOCOCCUS SPECIES (COAGULASE NEGATIVE) THE SIGNIFICANCE OF ISOLATING THIS ORGANISM FROM A SINGLE SET OF BLOOD CULTURES WHEN MULTIPLE SETS ARE DRAWN IS UNCERTAIN. PLEASE NOTIFY THE MICROBIOLOGY DEPARTMENT WITHIN ONE WEEK IF SPECIATION AND SENSITIVITIES ARE REQUIRED. Performed at [facility], [address] [place], [place] [address]    Report Status 04/17/2020 FINAL  Final  Blood Culture ID Panel (Reflexed)     Status: Abnormal   Collection Time: 04/14/20  9:23 PM  Result Value Ref Range Status   Enterococcus species NOT DETECTED NOT DETECTED Final   Listeria monocytogenes NOT DETECTED NOT DETECTED Final   Staphylococcus species DETECTED (A) NOT DETECTED Final    Comment: Methicillin (oxacillin) resistant coagulase negative staphylococcus. Possible blood culture contaminant (unless isolated from more than one blood culture draw or clinical case suggests pathogenicity). No antibiotic treatment is indicated for blood  culture contaminants. CRITICAL RESULT CALLED TO, READ BACK BY AND VERIFIED WITH: JASON ROBBINS 04/15/20 @1808  BY ACR    Staphylococcus aureus (BCID) NOT DETECTED NOT DETECTED Final   Methicillin resistance DETECTED (A) NOT DETECTED Final    Comment: CRITICAL RESULT CALLED TO, READ BACK BY AND VERIFIED WITH: JASON ROBBINS 04/15/20 @1808  BY ACR    Streptococcus species NOT DETECTED NOT DETECTED Final   Streptococcus agalactiae NOT DETECTED NOT DETECTED Final   Streptococcus pneumoniae NOT DETECTED NOT DETECTED Final   Streptococcus pyogenes NOT DETECTED NOT DETECTED Final   Acinetobacter baumannii NOT DETECTED NOT DETECTED Final   Enterobacteriaceae species NOT  DETECTED NOT DETECTED Final   Enterobacter cloacae complex NOT DETECTED NOT DETECTED Final   Escherichia coli NOT DETECTED NOT DETECTED Final   Klebsiella oxytoca NOT DETECTED NOT DETECTED Final   Klebsiella pneumoniae NOT DETECTED  NOT DETECTED Final   Proteus species NOT DETECTED NOT DETECTED Final   Serratia marcescens NOT DETECTED NOT DETECTED Final   Haemophilus influenzae NOT DETECTED NOT DETECTED Final   Neisseria meningitidis NOT DETECTED NOT DETECTED Final   Pseudomonas aeruginosa NOT DETECTED NOT DETECTED Final   Candida albicans NOT DETECTED NOT DETECTED Final   Candida glabrata NOT DETECTED NOT DETECTED Final   Candida krusei NOT DETECTED NOT DETECTED Final   Candida parapsilosis NOT DETECTED NOT DETECTED Final   Candida tropicalis NOT DETECTED NOT DETECTED Final    Comment: Performed at [facility], [address] [place], [place] [address]  Aerobic/Anaerobic Culture (surgical/deep wound)     Status: None   Collection Time: 04/16/20  8:22 AM   Specimen: PATH Cytology Misc. fluid; Body Fluid  Result Value Ref Range Status   Specimen Description   Final    WOUND Performed at [facility], [address], [place], [place] [address]    Special Requests   Final    NONE Performed at [facility], [address] [place], [place] [address]    Gram Stain NO WBC SEEN NO ORGANISMS SEEN   Final   Culture   Final    No growth aerobically or anaerobically. Performed at [facility], [address] [place], [place] [address]    Report Status 04/21/2020 FINAL  Final  Aerobic/Anaerobic Culture (surgical/deep wound)     Status: None   Collection Time: 04/16/20  8:22 AM   Specimen: PATH Cytology Misc. fluid; Body Fluid  Result Value Ref Range Status   Specimen Description   Final    WOUND Performed at [facility], [address], [place], [place] [address]    Special Requests   Final    NONE Performed at [facility],  [address] [place], [place] [address]    Gram Stain NO WBC SEEN NO ORGANISMS SEEN   Final   Culture   Final    No growth aerobically or anaerobically. Performed at [facility], [address], [place], [place] [address]    Report Status 04/21/2020 FINAL  Final  Aerobic/Anaerobic Culture (surgical/deep wound)     Status: None   Collection Time: 04/16/20  8:22 AM   Specimen: PATH Bone biopsy; Tissue  Result Value Ref Range Status   Specimen Description   Final    BONE Performed at [facility], [address], [place], [place] [address]    Special Requests   Final    NONE Performed at [facility], [address] [place], [place] [address]    Gram Stain NO WBC SEEN NO ORGANISMS SEEN   Final   Culture   Final    No growth aerobically or anaerobically. Performed at [facility], [address] [place], [place] [address]    Report Status 04/21/2020 FINAL  Final      Imaging Studies   No results found.   Medications   Scheduled Meds: . amLODipine  10 mg Oral Daily  . aspirin EC  81 mg Oral Daily  . chlorhexidine  15 mL Mouth Rinse BID  . Chlorhexidine Gluconate Cloth  6 each Topical Q0600  . clonazePAM  0.5 mg Oral BID  . DULoxetine  30 mg Oral Daily  . enoxaparin (LOVENOX) injection  40 mg Subcutaneous Q24H  . famotidine  20 mg Oral Daily  . feeding supplement (GLUCERNA SHAKE)  237 mL Oral BID  BM  . furosemide  40 mg Oral BID  . gabapentin  300 mg Oral BID  . haloperidol  0.25 mg Oral BID  . insulin aspart  0-5 Units Subcutaneous QHS  . insulin aspart  0-9 Units Subcutaneous TID WC  . insulin aspart  10 Units Subcutaneous TID WC  . [START ON 04/24/2020] insulin glargine  30 Units Subcutaneous Daily  . losartan  100 mg Oral Daily  . mouth rinse  15 mL Mouth Rinse q12n4p  . methocarbamol  500 mg Oral TID  . multivitamin with minerals  1 tablet Oral Daily  . OLANZapine  5 mg Oral QHS  . polyethylene glycol  17 g Oral Daily     Continuous Infusions: . sodium chloride 20.8 mL/hr at 04/01/20 0938  . nafcillin (NAFCIL) continuous infusion 12 g (04/23/20 1033)       LOS: 41 days    Time spent: 20 minutes    Asusena Sigley,MD [facility]  04/23/2020, 1:43 PM

## 2020-04-23 NOTE — Progress Notes (Signed)
Patient refused bed alarm.    

## 2020-04-23 NOTE — Progress Notes (Signed)
ID Pt says left arm pain and weakness improving Still not consistent with collar wear- found most of the time in bed without collar   Patient Vitals for the past 24 hrs:  BP Temp Temp src Pulse Resp SpO2  04/23/20 1202 (!) 171/99 -- -- 92 -- --  04/23/20 1200 (!) 153/107 98.5 F (36.9 C) Oral 93 14 100 %  04/23/20 0508 (!) 153/100 98.1 F (36.7 C) -- 91 20 99 %  04/22/20 2156 (!) 164/98 97.7 F (36.5 C) Oral 87 17 100 %   0/e awake and alert In bed No distress Chest b/l air entry [id number] Abd soft Left arm weakness Labs CBC    Component Value Date/Time   WBC 7.1 04/21/2020 0410   RBC 3.57 (L) 04/21/2020 0410   HGB 10.9 (L) 04/21/2020 0410   HCT 33.3 (L) 04/21/2020 0410   PLT 321 04/21/2020 0410   MCV 93.3 04/21/2020 0410   MCH 30.5 04/21/2020 0410   MCHC 32.7 04/21/2020 0410   RDW 16.1 (H) 04/21/2020 0410   LYMPHSABS 0.7 03/13/2020 1659   MONOABS 0.6 03/13/2020 1659   EOSABS 0.0 03/13/2020 1659   BASOSABS 0.1 03/13/2020 1659     CMP Latest Ref Rng & Units 04/21/2020 04/18/2020 04/17/2020  Glucose 70 - 99 mg/dL 304(H) 116(H) 139(H)  BUN 8 - 23 mg/dL 21 23 27(H)  Creatinine 0.61 - 1.24 mg/dL 0.77 0.70 0.85  Sodium 135 - 145 mmol/L 139 140 142  Potassium 3.5 - 5.1 mmol/L 3.8 3.6 3.5  Chloride 98 - 111 mmol/L 107 105 108  CO2 22 - 32 mmol/L _0 Calcium 8.9 - 10.3 mg/dL 8.0(L) 8.1(L) 8.4(L)  Total Protein 6.5 - 8.1 g/dL 5.4(L) - -  Total Bilirubin 0.3 - 1.2 mg/dL 1.0 - -  Alkaline Phos 38 - 126 U/L 88 - -  AST 15 - 41 U/L 17 - -  ALT 0 - 44 U/L 19 - -           Impression/recommendation  MSSA bacteremia with cervical vertebrae multiple level infection-S/p washout /removal of previous hardware C5-C7 on 04/17/11 after   5 weeks of IV antibiotics because of progression.  Cultures no growth. Rifampin stopped  he will be need IV antibiotics for atleast 4 weeksuntil 05/13/20 Asked patient to wear neck collar. Will follow ESR CRP.

## 2020-04-24 LAB — GLUCOSE, CAPILLARY
Glucose-Capillary: 142 mg/dL — ABNORMAL HIGH (ref 70–99)
Glucose-Capillary: 305 mg/dL — ABNORMAL HIGH (ref 70–99)
Glucose-Capillary: 353 mg/dL — ABNORMAL HIGH (ref 70–99)
Glucose-Capillary: 324 mg/dL — ABNORMAL HIGH (ref 70–99)

## 2020-04-24 NOTE — Progress Notes (Signed)
PROGRESS NOTE    Spencer [name]   QJF:[id number]  DOB: Jun 15, 1961  PCP: No primary care provider on file.    DOA: 03/13/2020 LOS: 42    Patient denies any complaints. He has some neck pain. He reports he wears collar however collar is majority of the time laying on his bed. Sugars labile. Assessment & Plan    59 year old male with history of uncontrolled type 2 diabetes mellitus, cocaine use, brain aneurysm, hypertension, GERD recently hospitalized on 4/28 for DKA, MSSA bacteremia and AKI but signed out AMA on 5/5 was brought by EMS with hyperglycemia and encephalopathy.      MSSA bacteremia, persistent - Blood culture positive on 4/29 and (5/8) positive for MSSA.  -2D echo unremarkable. -  Repeat blood culture on 5/10 and 5/27, negative. --he will need at least 4 weeks of IV nafcillin and rifampin from 6/11 (from last washout surgery) till July 8th -deep surgical Cultures from 6/11--so far negative -Completed course of prednisone taper on 6/12.  - pain meds prn (oxycodone 10 mg Q 4 hrs prn)  Acute pulmonary edema - resolved Unclear etiology.  2D echo from 5/8 with normal EF and no valvular abnormality. Repeated 2D echo on 6/1 which did not show any new valvular issues, EF 55-60%.   --Monitor strict I's/O.  -9.8 L --As needed IV Lasix.    Minimally displaced C4 fracture and now new displaced fractures at C1-2 -Recommendation to wear Aspen collar at all times but patient does not wear it when he is in bed --continue low dose gabapentin   Cervical and thoracic spine osteomyelitis -Seen on MRI of the cervical spine from 5/27: Showed progressive perivertebral soft tissue swelling with concern for osteomyelitis of C1-C5, T2 and T3 vertebral bodies.  Also has ventral epidural phlegmon extending from C2-C5 and dorsal epidural phlegmon extending from C3-C4-6 6-C7, without discrete epidural abscess, high-grade stenosis or cord signal abnormality and acute to subacute mild T3 superior  endplate compression fracture. --ID recommends total 4 weeks pf  IV antibiotic from 6/11 depending on the culture result  --which are so far negative till July 8th - s/p Anterior cervical washout and debridement with removal of Anterior cervical instrumentation at C5-7 & C4-5 disc biopsy by Neurosurgery on 6/11 - Drain removed on 6/13 - Maintain ASPEN collar at all times.  Diabetes mellitus type 2, poorly controlled with hyper and hypoglycemia -A1c of 12.6.  Presented with recurrent DKA which has resolved.  - Currently on Lantus with Premeal aspart and sliding scale coverage.  - Needs close CBG monitoring.  Essential hypertension - Continue amlodipine and losartan. PRN hydralazine.    H/o Ongoing cocaine use -Urine drug screen positive this admission.   - On as needed clonazepam   Acute kidney injury Prerenal secondary to DKA.  Resolved.  Issues with decision-making capacity Seen by psychiatry multiple times this admission and has capacity.   Cannot be involuntarily committed if he decides to leave AMA.  Depression/Anxiety Continue Klonopin, Cymbalta, Zyprexa  GERD Continue Pepcid   Patient BMI: Body mass index is 22.24 kg/m.   DVT prophylaxis: enoxaparin (LOVENOX) injection 40 mg Start: 04/20/20 2200 SCDs Start: 03/13/20 1848     Code Status: Full Code     Status is: Inpatient  Remains inpatient appropriate because: pt needs to completed IV abxs--d/c barrier drug abuse, medicaid--no bed offers so far   Dispo: The patient is from: Home              Anticipated  d/c is to: Home              Anticipated d/c date is: > 3 days (planned final day abx 7/9)              Patient currently- is stable for d/c but needs to finish IV Abx while inpt. ID recommends total 4 weeks off IV antibiotic from 6/11  Per TOC no offers yet barrier to discharge Medicaid and drug abuse     Consults, Procedures, Significant Events   Consultants:   Neurosurgery  Infectious  Disease  Cardiology  Procedures:   MRI Cervical Spine  2D Echo  Antimicrobials:   IV Nafcillin  5/21 >>    Objective   Vitals:   04/23/20 1202 04/23/20 2113 04/24/20 0429 04/24/20 1206  BP: (!) 171/99 (!) 145/94 (!) 160/100 140/86  Pulse: 92 91 90 94  Resp:  20 20 14   Temp:  98.6 F (37 C) 98.1 F (36.7 C) (!) 97.5 F (36.4 C)  TempSrc:      SpO2:  98% 97% 97%  Weight:      Height:        Intake/Output Summary (Last 24 hours) at 04/24/2020 1303 Last data filed at 04/24/2020 1207 Gross per 24 hour  Intake 1931.33 ml  Output 1750 ml  Net 181.33 ml   Filed Weights   03/13/20 1654 03/13/20 2055 04/01/20 2230  Weight: 59 kg 60.3 kg 70.3 kg    Physical Exam:  General exam: awake, alert, no acute distress Respiratory system: CTAB, no wheezes, rales or rhonchi, normal respiratory effort. Cardiovascular system: normal S1/S2, RRR, no pedal edema.   Neurologic: left upper extremity weak, unable to keep raised against gravity, intact grip strength.  Other extremities with motor intact.  No spasticity noted. Extremities: pain with passive ROM in left shoulder flexion and abduction, no peripheral edema   Labs   Data Reviewed: I have personally reviewed following labs and imaging studies  CBC: Recent Labs  Lab 04/18/20 0544 04/21/20 0410  WBC 9.3 7.1  HGB 10.4* 10.9*  HCT 31.2* 33.3*  MCV 93.1 93.3  PLT 283 321   Basic Metabolic Panel: Recent Labs  Lab 04/18/20 0544 04/21/20 0410  NA 140 139  K 3.6 3.8  CL 105 107  CO2 28 26  GLUCOSE 116* 304*  BUN 23 21  CREATININE 0.70 0.77  CALCIUM 8.1* 8.0*   GFR: Estimated Creatinine Clearance: 96.4 mL/min (by C-G formula based on SCr of 0.77 mg/dL). Liver Function Tests: Recent Labs  Lab 04/21/20 1641  AST 17  ALT 19  ALKPHOS 88  BILITOT 1.0  PROT 5.4*  ALBUMIN 2.0*   No results for input(s): LIPASE, AMYLASE in the last 168 hours. No results for input(s): AMMONIA in the last 168 hours. Coagulation  Profile: No results for input(s): INR, PROTIME in the last 168 hours. Cardiac Enzymes: No results for input(s): CKTOTAL, CKMB, CKMBINDEX, TROPONINI in the last 168 hours. BNP (last 3 results) No results for input(s): PROBNP in the last 8760 hours. HbA1C: No results for input(s): HGBA1C in the last 72 hours. CBG: Recent Labs  Lab 04/23/20 2115 04/23/20 2117 04/23/20 2308 04/24/20 0807 04/24/20 1216  GLUCAP 437* 409* 257* 142* 305*   Lipid Profile: No results for input(s): CHOL, HDL, LDLCALC, TRIG, CHOLHDL, LDLDIRECT in the last 72 hours. Thyroid Function Tests: No results for input(s): TSH, T4TOTAL, FREET4, T3FREE, THYROIDAB in the last 72 hours. Anemia Panel: No results for input(s): VITAMINB12, FOLATE, FERRITIN,  TIBC, IRON, RETICCTPCT in the last 72 hours. Sepsis Labs: No results for input(s): PROCALCITON, LATICACIDVEN in the last 168 hours.  Recent Results (from the past 240 hour(s))  CULTURE, BLOOD (ROUTINE X 2) w Reflex to ID Panel     Status: None   Collection Time: 04/14/20  9:12 PM   Specimen: Left Antecubital; Blood  Result Value Ref Range Status   Specimen Description LEFT ANTECUBITAL  Final   Special Requests   Final    BOTTLES DRAWN AEROBIC AND ANAEROBIC Blood Culture adequate volume   Culture   Final    NO GROWTH 5 DAYS Performed at [facility], [address] [place], [place] [address]    Report Status 04/19/2020 FINAL  Final  CULTURE, BLOOD (ROUTINE X 2) w Reflex to ID Panel     Status: Abnormal   Collection Time: 04/14/20  9:22 PM   Specimen: BLOOD  Result Value Ref Range Status   Specimen Description   Final    BLOOD BLOOD LEFT HAND Performed at [facility], [address], [place], [place] [address]    Special Requests   Final    BOTTLES DRAWN AEROBIC AND ANAEROBIC Blood Culture adequate volume Performed at [facility], [address] [place], [place] [address]    Culture  Setup Time   Final    GRAM POSITIVE  COCCI AEROBIC BOTTLE ONLY CRITICAL RESULT CALLED TO, READ BACK BY AND VERIFIED WITH: JASON ROBBINS AT 1808 04/15/20 BY [email or web address]    Culture (A)  Final    STAPHYLOCOCCUS SPECIES (COAGULASE NEGATIVE) THE SIGNIFICANCE OF ISOLATING THIS ORGANISM FROM A SINGLE SET OF BLOOD CULTURES WHEN MULTIPLE SETS ARE DRAWN IS UNCERTAIN. PLEASE NOTIFY THE MICROBIOLOGY DEPARTMENT WITHIN ONE WEEK IF SPECIATION AND SENSITIVITIES ARE REQUIRED. Performed at [facility], [address] [place], [place] [address]    Report Status 04/17/2020 FINAL  Final  Blood Culture ID Panel (Reflexed)     Status: Abnormal   Collection Time: 04/14/20  9:23 PM  Result Value Ref Range Status   Enterococcus species NOT DETECTED NOT DETECTED Final   Listeria monocytogenes NOT DETECTED NOT DETECTED Final   Staphylococcus species DETECTED (A) NOT DETECTED Final    Comment: Methicillin (oxacillin) resistant coagulase negative staphylococcus. Possible blood culture contaminant (unless isolated from more than one blood culture draw or clinical case suggests pathogenicity). No antibiotic treatment is indicated for blood  culture contaminants. CRITICAL RESULT CALLED TO, READ BACK BY AND VERIFIED WITH: JASON ROBBINS 04/15/20 @1808  BY ACR    Staphylococcus aureus (BCID) NOT DETECTED NOT DETECTED Final   Methicillin resistance DETECTED (A) NOT DETECTED Final    Comment: CRITICAL RESULT CALLED TO, READ BACK BY AND VERIFIED WITH: JASON ROBBINS 04/15/20 @1808  BY ACR    Streptococcus species NOT DETECTED NOT DETECTED Final   Streptococcus agalactiae NOT DETECTED NOT DETECTED Final   Streptococcus pneumoniae NOT DETECTED NOT DETECTED Final   Streptococcus pyogenes NOT DETECTED NOT DETECTED Final   Acinetobacter baumannii NOT DETECTED NOT DETECTED Final   Enterobacteriaceae species NOT DETECTED NOT DETECTED Final   Enterobacter cloacae complex NOT DETECTED NOT DETECTED Final   Escherichia coli NOT DETECTED NOT DETECTED Final   Klebsiella  oxytoca NOT DETECTED NOT DETECTED Final   Klebsiella pneumoniae NOT DETECTED NOT DETECTED Final   Proteus species NOT DETECTED NOT DETECTED Final   Serratia marcescens NOT DETECTED NOT DETECTED Final   Haemophilus influenzae NOT DETECTED NOT DETECTED Final   Neisseria meningitidis NOT DETECTED  NOT DETECTED Final   Pseudomonas aeruginosa NOT DETECTED NOT DETECTED Final   Candida albicans NOT DETECTED NOT DETECTED Final   Candida glabrata NOT DETECTED NOT DETECTED Final   Candida krusei NOT DETECTED NOT DETECTED Final   Candida parapsilosis NOT DETECTED NOT DETECTED Final   Candida tropicalis NOT DETECTED NOT DETECTED Final    Comment: Performed at [facility], [address] [place], [place] [address]  Aerobic/Anaerobic Culture (surgical/deep wound)     Status: None   Collection Time: 04/16/20  8:22 AM   Specimen: PATH Cytology Misc. fluid; Body Fluid  Result Value Ref Range Status   Specimen Description   Final    WOUND Performed at [facility], [address] [place], [place] [address]    Special Requests   Final    NONE Performed at [facility], [address] [place], [place] [address]    Gram Stain NO WBC SEEN NO ORGANISMS SEEN   Final   Culture   Final    No growth aerobically or anaerobically. Performed at [facility], [address] [place], [place] [address]    Report Status 04/21/2020 FINAL  Final  Aerobic/Anaerobic Culture (surgical/deep wound)     Status: None   Collection Time: 04/16/20  8:22 AM   Specimen: PATH Cytology Misc. fluid; Body Fluid  Result Value Ref Range Status   Specimen Description   Final    WOUND Performed at [facility], [address] [place], [place] [address]    Special Requests   Final    NONE Performed at [facility], [address] [place], [place] [address]    Gram Stain NO WBC SEEN NO ORGANISMS SEEN   Final   Culture   Final    No growth aerobically or  anaerobically. Performed at [facility], [address] [place], [place] [address]    Report Status 04/21/2020 FINAL  Final  Aerobic/Anaerobic Culture (surgical/deep wound)     Status: None   Collection Time: 04/16/20  8:22 AM   Specimen: PATH Bone biopsy; Tissue  Result Value Ref Range Status   Specimen Description   Final    BONE Performed at [facility], [address] [place], [place] [address]    Special Requests   Final    NONE Performed at [facility], [address] [place], [place] [address]    Gram Stain NO WBC SEEN NO ORGANISMS SEEN   Final   Culture   Final    No growth aerobically or anaerobically. Performed at [facility], [address] [place], [place] [address]    Report Status 04/21/2020 FINAL  Final      Imaging Studies   No results found.   Medications   Scheduled Meds: . amLODipine  10 mg Oral Daily  . aspirin EC  81 mg Oral Daily  . chlorhexidine  15 mL Mouth Rinse BID  . Chlorhexidine Gluconate Cloth  6 each Topical Q0600  . clonazePAM  0.5 mg Oral BID  . DULoxetine  30 mg Oral Daily  . enoxaparin (LOVENOX) injection  40 mg Subcutaneous Q24H  . famotidine  20 mg Oral Daily  . feeding supplement (GLUCERNA SHAKE)  237 mL Oral BID BM  . furosemide  40 mg Oral BID  . gabapentin  300 mg Oral BID  . haloperidol  0.25 mg Oral BID  . insulin aspart  0-5 Units Subcutaneous QHS  .  insulin aspart  0-9 Units Subcutaneous TID WC  . insulin aspart  10 Units Subcutaneous TID WC  . insulin glargine  30 Units Subcutaneous Daily  . losartan  100 mg Oral Daily  . mouth rinse  15 mL Mouth Rinse q12n4p  . methocarbamol  500 mg Oral TID  . multivitamin with minerals  1 tablet Oral Daily  . OLANZapine  5 mg Oral QHS  . polyethylene glycol  17 g Oral Daily   Continuous Infusions: . sodium chloride 20.8 mL/hr at 04/01/20 0938  . nafcillin (NAFCIL) continuous infusion 12 g (04/24/20 1008)       LOS: 42 days     Time spent: 20 minutes    Nusrat Encarnacion,MD [facility]  04/24/2020, 1:03 PM

## 2020-04-25 LAB — GLUCOSE, CAPILLARY
Glucose-Capillary: 283 mg/dL — ABNORMAL HIGH (ref 70–99)
Glucose-Capillary: 389 mg/dL — ABNORMAL HIGH (ref 70–99)
Glucose-Capillary: 137 mg/dL — ABNORMAL HIGH (ref 70–99)
Glucose-Capillary: 246 mg/dL — ABNORMAL HIGH (ref 70–99)

## 2020-04-25 MED ORDER — INSULIN ASPART 100 UNIT/ML ~~LOC~~ SOLN
12.0000 [IU] | Freq: Three times a day (TID) | SUBCUTANEOUS | Status: DC
  Administered 2020-04-25 – 2020-04-26 (×4): 12 [IU] via SUBCUTANEOUS
  Filled 2020-04-25 (×4): qty 1

## 2020-04-25 MED ORDER — INSULIN GLARGINE 100 UNIT/ML ~~LOC~~ SOLN
35.0000 [IU] | Freq: Every day | SUBCUTANEOUS | Status: DC
  Administered 2020-04-25 – 2020-04-26 (×2): 35 [IU] via SUBCUTANEOUS
  Filled 2020-04-25 (×2): qty 0.35

## 2020-04-25 NOTE — Progress Notes (Signed)
PROGRESS NOTE    Spencer [name]   GMW:[id number]  DOB: 08-18-1961  PCP: No primary care provider on file.    DOA: 03/13/2020 LOS: 43    Patient denies any complaints. He reports he wears collar however collar is majority of the time laying on his bed. Sugars labile. Assessment & Plan    59 year old male with history of uncontrolled type 2 diabetes mellitus, cocaine use, brain aneurysm, hypertension, GERD recently hospitalized on 4/28 for DKA, MSSA bacteremia and AKI but signed out AMA on 5/5 was brought by EMS with hyperglycemia and encephalopathy.      MSSA bacteremia, persistent - Blood culture positive on 4/29 and (5/8) positive for MSSA.  -2D echo unremarkable. -  Repeat blood culture on 5/10 and 5/27, negative. --he will need at least 4 weeks of IV nafcillin and rifampin from 6/11 (from last washout surgery) till July 8th -deep surgical Cultures from 6/11--so far negative -Completed course of prednisone taper on 6/12.  - pain meds prn (oxycodone 10 mg Q 4 hrs prn)  Acute pulmonary edema - resolved Unclear etiology.  2D echo from 5/8 with normal EF and no valvular abnormality. Repeated 2D echo on 6/1 which did not show any new valvular issues, EF 55-60%.   --Monitor strict I's/O.  -9.8 L --As needed IV Lasix.    Minimally displaced C4 fracture and now new displaced fractures at C1-2 -Recommendation to wear Aspen collar at all times but patient does not wear it when he is in bed --continue low dose gabapentin   Cervical and thoracic spine osteomyelitis -Seen on MRI of the cervical spine from 5/27: Showed progressive perivertebral soft tissue swelling with concern for osteomyelitis of C1-C5, T2 and T3 vertebral bodies.  Also has ventral epidural phlegmon extending from C2-C5 and dorsal epidural phlegmon extending from C3-C4-6 6-C7, without discrete epidural abscess, high-grade stenosis or cord signal abnormality and acute to subacute mild T3 superior endplate compression  fracture. --ID recommends total 4 weeks pf  IV antibiotic from 6/11 depending on the culture result  --which are so far negative till July 8th - s/p Anterior cervical washout and debridement with removal of Anterior cervical instrumentation at C5-7 & C4-5 disc biopsy by Neurosurgery on 6/11 - Drain removed on 6/13 - Maintain ASPEN collar at all times.  Diabetes mellitus type 2, poorly controlled with hyper and hypoglycemia -A1c of 12.6.  Presented with recurrent DKA which has resolved.  - Currently on Lantus with Premeal aspart and sliding scale coverage--dose adjusted - Needs close CBG monitoring.  Essential hypertension - Continue amlodipine and losartan. PRN hydralazine.    H/o Ongoing cocaine use -Urine drug screen positive this admission.   - On as needed clonazepam   Acute kidney injury Prerenal secondary to DKA.  Resolved.  Issues with decision-making capacity Seen by psychiatry multiple times this admission and has capacity.   Cannot be involuntarily committed if he decides to leave AMA.  Depression/Anxiety Continue Klonopin, Cymbalta, Zyprexa  GERD Continue Pepcid   Patient BMI: Body mass index is 22.24 kg/m.   DVT prophylaxis: enoxaparin (LOVENOX) injection 40 mg Start: 04/20/20 2200 SCDs Start: 03/13/20 1848     Code Status: Full Code     Status is: Inpatient  Remains inpatient appropriate because: pt needs to completed IV abxs--d/c barrier drug abuse, medicaid--no bed offers so far   Dispo: The patient is from: Home              Anticipated d/c is to: Home  Anticipated d/c date is: > 3 days (planned final day abx 7/9)              Patient currently- is stable for d/c but needs to finish IV Abx while inpt. ID recommends total 4 weeks off IV antibiotic from 6/11  Per TOC no offers yet barrier to discharge Medicaid and drug abuse     Consults, Procedures, Significant Events   Consultants:   Neurosurgery  Infectious  Disease  Cardiology  Procedures:   MRI Cervical Spine  2D Echo  Antimicrobials:   IV Nafcillin  5/21 >>    Objective   Vitals:   04/24/20 0429 04/24/20 1206 04/24/20 1946 04/25/20 0412  BP: (!) 160/100 140/86 138/79 132/76  Pulse: 90 94 92 90  Resp: 20 14 20 18   Temp: 98.1 F (36.7 C) (!) 97.5 F (36.4 C) 98.1 F (36.7 C) 98.3 F (36.8 C)  TempSrc:   Oral Oral  SpO2: 97% 97% 98% 98%  Weight:      Height:        Intake/Output Summary (Last 24 hours) at 04/25/2020 0815 Last data filed at 04/25/2020 0350 Gross per 24 hour  Intake 713.66 ml  Output 2150 ml  Net -1436.34 ml   Filed Weights   03/13/20 1654 03/13/20 2055 04/01/20 2230  Weight: 59 kg 60.3 kg 70.3 kg    Physical Exam:  General exam: awake, alert, no acute distress Respiratory system: CTAB, no wheezes, rales or rhonchi, normal respiratory effort. Cardiovascular system: normal S1/S2, RRR, no pedal edema.   Neurologic: left upper extremity weak, unable to keep raised against gravity, intact grip strength.  Other extremities with motor intact.  No spasticity noted. Extremities: pain with passive ROM in left shoulder flexion and abduction, no peripheral edema   Labs   Data Reviewed: I have personally reviewed following labs and imaging studies  CBC: Recent Labs  Lab 04/21/20 0410  WBC 7.1  HGB 10.9*  HCT 33.3*  MCV 93.3  PLT 321   Basic Metabolic Panel: Recent Labs  Lab 04/21/20 0410  NA 139  K 3.8  CL 107  CO2 26  GLUCOSE 304*  BUN 21  CREATININE 0.77  CALCIUM 8.0*   GFR: Estimated Creatinine Clearance: 96.4 mL/min (by C-G formula based on SCr of 0.77 mg/dL). Liver Function Tests: Recent Labs  Lab 04/21/20 1641  AST 17  ALT 19  ALKPHOS 88  BILITOT 1.0  PROT 5.4*  ALBUMIN 2.0*   No results for input(s): LIPASE, AMYLASE in the last 168 hours. No results for input(s): AMMONIA in the last 168 hours. Coagulation Profile: No results for input(s): INR, PROTIME in the last 168  hours. Cardiac Enzymes: No results for input(s): CKTOTAL, CKMB, CKMBINDEX, TROPONINI in the last 168 hours. BNP (last 3 results) No results for input(s): PROBNP in the last 8760 hours. HbA1C: No results for input(s): HGBA1C in the last 72 hours. CBG: Recent Labs  Lab 04/23/20 2308 04/24/20 0807 04/24/20 1216 04/24/20 1611 04/24/20 2127  GLUCAP 257* 142* 305* 353* 324*   Lipid Profile: No results for input(s): CHOL, HDL, LDLCALC, TRIG, CHOLHDL, LDLDIRECT in the last 72 hours. Thyroid Function Tests: No results for input(s): TSH, T4TOTAL, FREET4, T3FREE, THYROIDAB in the last 72 hours. Anemia Panel: No results for input(s): VITAMINB12, FOLATE, FERRITIN, TIBC, IRON, RETICCTPCT in the last 72 hours. Sepsis Labs: No results for input(s): PROCALCITON, LATICACIDVEN in the last 168 hours.  Recent Results (from the past 240 hour(s))  Aerobic/Anaerobic Culture (surgical/deep  wound)     Status: None   Collection Time: 04/16/20  8:22 AM   Specimen: PATH Cytology Misc. fluid; Body Fluid  Result Value Ref Range Status   Specimen Description   Final    WOUND Performed at [facility], [address] [place], [place] [address]    Special Requests   Final    NONE Performed at [facility], [address] [place], [place] [address]    Gram Stain NO WBC SEEN NO ORGANISMS SEEN   Final   Culture   Final    No growth aerobically or anaerobically. Performed at [facility], [address] [place], [place] [address]    Report Status 04/21/2020 FINAL  Final  Aerobic/Anaerobic Culture (surgical/deep wound)     Status: None   Collection Time: 04/16/20  8:22 AM   Specimen: PATH Cytology Misc. fluid; Body Fluid  Result Value Ref Range Status   Specimen Description   Final    WOUND Performed at [facility], [address] [place], [place] [address]    Special Requests   Final    NONE Performed at [facility], [address]  [place], [place] [address]    Gram Stain NO WBC SEEN NO ORGANISMS SEEN   Final   Culture   Final    No growth aerobically or anaerobically. Performed at [facility], [address] [place], [place] [address]    Report Status 04/21/2020 FINAL  Final  Aerobic/Anaerobic Culture (surgical/deep wound)     Status: None   Collection Time: 04/16/20  8:22 AM   Specimen: PATH Bone biopsy; Tissue  Result Value Ref Range Status   Specimen Description   Final    BONE Performed at [facility], [address], [place], [place] [address]    Special Requests   Final    NONE Performed at [facility], [address] [place], [place] [address]    Gram Stain NO WBC SEEN NO ORGANISMS SEEN   Final   Culture   Final    No growth aerobically or anaerobically. Performed at [facility], [address] [place], [place] [address]    Report Status 04/21/2020 FINAL  Final      Imaging Studies   No results found.   Medications   Scheduled Meds: . amLODipine  10 mg Oral Daily  . aspirin EC  81 mg Oral Daily  . chlorhexidine  15 mL Mouth Rinse BID  . Chlorhexidine Gluconate Cloth  6 each Topical Q0600  . clonazePAM  0.5 mg Oral BID  . DULoxetine  30 mg Oral Daily  . enoxaparin (LOVENOX) injection  40 mg Subcutaneous Q24H  . famotidine  20 mg Oral Daily  . feeding supplement (GLUCERNA SHAKE)  237 mL Oral BID BM  . furosemide  40 mg Oral BID  . gabapentin  300 mg Oral BID  . haloperidol  0.25 mg Oral BID  . insulin aspart  0-5 Units Subcutaneous QHS  . insulin aspart  0-9 Units Subcutaneous TID WC  . insulin aspart  12 Units Subcutaneous TID WC  . insulin glargine  35 Units Subcutaneous Daily  . losartan  100 mg Oral Daily  . mouth rinse  15 mL Mouth Rinse q12n4p  . methocarbamol  500 mg Oral TID  . multivitamin with minerals  1 tablet Oral Daily  . OLANZapine  5 mg Oral QHS  . polyethylene glycol  17  g Oral Daily   Continuous Infusions: . sodium chloride  20.8 mL/hr at 04/01/20 0938  . nafcillin (NAFCIL) continuous infusion 20.8 mL/hr at 04/25/20 0350       LOS: 43 days    Time spent: 20 minutes    Jeziel Hoffmann,MD [facility]  04/25/2020, 8:15 AM

## 2020-04-26 LAB — GLUCOSE, CAPILLARY
Glucose-Capillary: 287 mg/dL — ABNORMAL HIGH (ref 70–99)
Glucose-Capillary: 362 mg/dL — ABNORMAL HIGH (ref 70–99)
Glucose-Capillary: 197 mg/dL — ABNORMAL HIGH (ref 70–99)
Glucose-Capillary: 156 mg/dL — ABNORMAL HIGH (ref 70–99)

## 2020-04-26 MED ORDER — INSULIN ASPART 100 UNIT/ML ~~LOC~~ SOLN
14.0000 [IU] | Freq: Three times a day (TID) | SUBCUTANEOUS | Status: DC
  Administered 2020-04-26 – 2020-04-27 (×2): 14 [IU] via SUBCUTANEOUS
  Filled 2020-04-26 (×2): qty 1

## 2020-04-26 MED ORDER — INSULIN GLARGINE 100 UNIT/ML ~~LOC~~ SOLN
38.0000 [IU] | Freq: Every day | SUBCUTANEOUS | Status: DC
  Administered 2020-04-27: 38 [IU] via SUBCUTANEOUS
  Filled 2020-04-26: qty 0.38

## 2020-04-26 NOTE — Progress Notes (Signed)
PROGRESS NOTE    Spencer [name]   LSL:[id number]  DOB: Apr 14, 1961  PCP: No primary care provider on file.    DOA: 03/13/2020 LOS: 44    Patient denies any complaints.  Sugars still up Assessment & Plan    59 year old male with history of uncontrolled type 2 diabetes mellitus, cocaine use, brain aneurysm, hypertension, GERD recently hospitalized on 4/28 for DKA, MSSA bacteremia and AKI but signed out AMA on 5/5 was brought by EMS with hyperglycemia and encephalopathy.      MSSA bacteremia, persistent - Blood culture positive on 4/29 and (5/8) positive for MSSA.  -2D echo unremarkable. - Repeat blood culture on 5/10 and 5/27  negative. --pt will be on  IV nafcillin and rifampin  till July 8th -deep surgical Cultures from 6/11--so far negative -Completed course of prednisone taper on 6/12.  - pain meds prn (oxycodone 10 mg Q 4 hrs prn)  Acute pulmonary edema - resolved Unclear etiology.  2D echo from 5/8 with normal EF and no valvular abnormality. Repeated 2D echo on 6/1 which did not show any new valvular issues, EF 55-60%.    Minimally displaced C4 fracture and now new displaced fractures at C1-2 -Recommendation to wear Aspen collar at all times but patient does not wear it when he is in bed --continue low dose gabapentin   Cervical and thoracic spine osteomyelitis -Seen on MRI of the cervical spine from 5/27: Showed progressive perivertebral soft tissue swelling with concern for osteomyelitis of C1-C5, T2 and T3 vertebral bodies.  Also has ventral epidural phlegmon extending from C2-C5 and dorsal epidural phlegmon extending from C3-C4-6 6-C7, without discrete epidural abscess, high-grade stenosis or cord signal abnormality and acute to subacute mild T3 superior endplate compression fracture. --ID recommends total 4 weeks pf  IV antibiotic as above - s/p Anterior cervical washout and debridement with removal of Anterior cervical instrumentation at C5-7 & C4-5 disc biopsy by  Neurosurgery on 6/11 - Drain removed on 6/13 - Maintain ASPEN collar at all times.  Diabetes mellitus type 2, poorly controlled with hyper and hypoglycemia -A1c of 12.6.  Presented with recurrent DKA which has resolved.  - Currently on Lantus with Premeal aspart and sliding scale coverage--dose adjusted - Needs close CBG monitoring. -changed to po carb controlled diet  Essential hypertension - on amlodipine and losartan. PRN hydralazine.    H/o Ongoing cocaine use -Urine drug screen positive this admission.   - On as needed clonazepam   Acute kidney injury Prerenal secondary to DKA.  Resolved.  Issues with decision-making capacity Seen by psychiatry multiple times this admission and has capacity.   Cannot be involuntarily committed if he decides to leave AMA.  Depression/Anxiety Continue Klonopin, Cymbalta, Zyprexa  GERD Continue Pepcid   Patient BMI: Body mass index is 22.24 kg/m.   DVT prophylaxis: enoxaparin (LOVENOX) injection 40 mg Start: 04/20/20 2200 SCDs Start: 03/13/20 1848     Code Status: Full Code  Status is: Inpatient  Remains inpatient appropriate because: pt needs to complete IV abx  Dispo: The patient is from: Home              Anticipated d/c is to: Home              Anticipated d/c date is: > 3 days (planned final day abx 7/8)              Patient currently- is stable for d/c but needs to finish IV Abx while inpt. ID recommends total  4 weeks of IV antibiotics till 7/8 Per TOC no offers yet barrier to discharge Medicaid and drug abuse     Consults, Procedures, Significant Events   Consultants:   Neurosurgery  Infectious Disease  Cardiology  Procedures:   MRI Cervical Spine  2D Echo  Antimicrobials:   IV Nafcillin  5/21 >>    Objective   Vitals:   04/25/20 1138 04/25/20 1921 04/26/20 0424 04/26/20 1117  BP: 121/86 139/82 (!) 151/92 (!) 148/99  Pulse: 78 93 82 97  Resp: 16 18 20 16   Temp: 97.9 F (36.6 C) 97.7 F (36.5  C) 98.1 F (36.7 C) 98.4 F (36.9 C)  TempSrc:  Oral Oral Oral  SpO2: 97% 98% 98% 100%  Weight:      Height:        Intake/Output Summary (Last 24 hours) at 04/26/2020 1244 Last data filed at 04/26/2020 1134 Gross per 24 hour  Intake 1026 ml  Output 2400 ml  Net -1374 ml   Filed Weights   03/13/20 1654 03/13/20 2055 04/01/20 2230  Weight: 59 kg 60.3 kg 70.3 kg    Physical Exam:  General exam: awake, alert, no acute distress Respiratory system: CTAB, no wheezes, rales or rhonchi, normal respiratory effort. Cardiovascular system: normal S1/S2, RRR, no pedal edema.   Neurologic: left upper extremity weak, unable to keep raised against gravity, intact grip strength.  Other extremities with motor intact.  No spasticity noted. Extremities: pain with passive ROM in left shoulder flexion and abduction, no peripheral edema   Labs   Data Reviewed: I have personally reviewed following labs and imaging studies  CBC: Recent Labs  Lab 04/21/20 0410  WBC 7.1  HGB 10.9*  HCT 33.3*  MCV 93.3  PLT 321   Basic Metabolic Panel: Recent Labs  Lab 04/21/20 0410  NA 139  K 3.8  CL 107  CO2 26  GLUCOSE 304*  BUN 21  CREATININE 0.77  CALCIUM 8.0*   GFR: Estimated Creatinine Clearance: 96.4 mL/min (by C-G formula based on SCr of 0.77 mg/dL). Liver Function Tests: Recent Labs  Lab 04/21/20 1641  AST 17  ALT 19  ALKPHOS 88  BILITOT 1.0  PROT 5.4*  ALBUMIN 2.0*   No results for input(s): LIPASE, AMYLASE in the last 168 hours. No results for input(s): AMMONIA in the last 168 hours. Coagulation Profile: No results for input(s): INR, PROTIME in the last 168 hours. Cardiac Enzymes: No results for input(s): CKTOTAL, CKMB, CKMBINDEX, TROPONINI in the last 168 hours. BNP (last 3 results) No results for input(s): PROBNP in the last 8760 hours. HbA1C: No results for input(s): HGBA1C in the last 72 hours. CBG: Recent Labs  Lab 04/25/20 1134 04/25/20 1629 04/25/20 2106  04/26/20 0752 04/26/20 1118  GLUCAP 389* 137* 246* 287* 362*   Lipid Profile: No results for input(s): CHOL, HDL, LDLCALC, TRIG, CHOLHDL, LDLDIRECT in the last 72 hours. Thyroid Function Tests: No results for input(s): TSH, T4TOTAL, FREET4, T3FREE, THYROIDAB in the last 72 hours. Anemia Panel: No results for input(s): VITAMINB12, FOLATE, FERRITIN, TIBC, IRON, RETICCTPCT in the last 72 hours. Sepsis Labs: No results for input(s): PROCALCITON, LATICACIDVEN in the last 168 hours.  No results found for this or any previous visit (from the past 240 hour(s)).    Imaging Studies   No results found.   Medications   Scheduled Meds: . amLODipine  10 mg Oral Daily  . aspirin EC  81 mg Oral Daily  . chlorhexidine  15 mL Mouth  Rinse BID  . Chlorhexidine Gluconate Cloth  6 each Topical Q0600  . clonazePAM  0.5 mg Oral BID  . DULoxetine  30 mg Oral Daily  . enoxaparin (LOVENOX) injection  40 mg Subcutaneous Q24H  . famotidine  20 mg Oral Daily  . feeding supplement (GLUCERNA SHAKE)  237 mL Oral BID BM  . furosemide  40 mg Oral BID  . gabapentin  300 mg Oral BID  . haloperidol  0.25 mg Oral BID  . insulin aspart  0-5 Units Subcutaneous QHS  . insulin aspart  0-9 Units Subcutaneous TID WC  . insulin aspart  12 Units Subcutaneous TID WC  . insulin glargine  35 Units Subcutaneous Daily  . losartan  100 mg Oral Daily  . mouth rinse  15 mL Mouth Rinse q12n4p  . methocarbamol  500 mg Oral TID  . multivitamin with minerals  1 tablet Oral Daily  . OLANZapine  5 mg Oral QHS  . polyethylene glycol  17 g Oral Daily   Continuous Infusions: . sodium chloride 20.8 mL/hr at 04/01/20 0938  . nafcillin (NAFCIL) continuous infusion 12 g (04/26/20 1205)       LOS: 44 days    Time spent: 20 minutes    Wanisha Shiroma,MD [facility]  04/26/2020, 12:44 PM

## 2020-04-26 NOTE — Progress Notes (Signed)
Inpatient Diabetes Program Recommendations  AACE/ADA: New Consensus Statement on Inpatient Glycemic Control (2015)  Target Ranges:  Prepandial:   less than 140 mg/dL      Peak postprandial:   less than 180 mg/dL (1-2 hours)      Critically ill patients:  140 - 180 mg/dL    Results for Spencer [name], Spencer [name] (MRN [id number]) as of 04/26/2020 12:16  Ref. Range 04/25/2020 07:54 04/25/2020 11:34 04/25/2020 16:29 04/25/2020 21:06  Glucose-Capillary Latest Ref Range: 70 - 99 mg/dL 119 (H)  5 units NOVOLOG  389 (H)  21 units NOVOLOG +  35 units LANTUS  137 (H)  13 units NOVOLOG  246 (H)  2 units NOVOLOG     Results for Spencer [name], Spencer [name] (MRN [id number]) as of 04/26/2020 12:16  Ref. Range 04/26/2020 07:52 04/26/2020 11:18  Glucose-Capillary Latest Ref Range: 70 - 99 mg/dL 130 (H)  17 units NOVOLOG +  35 units LANTUS  362 (H)  21 units NOVOLOG     Home DM Meds: Lantus 18 units Daily                             Novolog 12 units TID   Current Orders: Lantus 35 units Daily       Novolog Sensitive Correction Scale/ SSI (0-9 units) TID AC + HS      Novolog 12 units TID with meals     MD- Note Lantus and Novolog Meal Coverage increased back yesterday AM.  CBGs remain elevated despite increases.  May consider the following:  1. Increase Lantus to 38 units Daily  2. Increase Novolog Meal Coverage to: Novolog 14 units TID with meals  Only recommending smaller adjustments since pt has also had issues with Hypoglycemia at times as well after getting large doses of Novolog    --Will follow patient during hospitalization--  Ambrose Finland RN, MSN, CDE Diabetes Coordinator Inpatient Glycemic Control Team Team Pager: [phone] (8a-5p)

## 2020-04-27 LAB — GLUCOSE, CAPILLARY
Glucose-Capillary: 283 mg/dL — ABNORMAL HIGH (ref 70–99)
Glucose-Capillary: 172 mg/dL — ABNORMAL HIGH (ref 70–99)
Glucose-Capillary: 89 mg/dL (ref 70–99)
Glucose-Capillary: 317 mg/dL — ABNORMAL HIGH (ref 70–99)

## 2020-04-27 LAB — BASIC METABOLIC PANEL
Anion gap: 6 (ref 5–15)
BUN: 26 mg/dL — ABNORMAL HIGH (ref 8–23)
CO2: 25 mmol/L (ref 22–32)
Calcium: 8.3 mg/dL — ABNORMAL LOW (ref 8.9–10.3)
Chloride: 108 mmol/L (ref 98–111)
Creatinine, Ser: 0.77 mg/dL (ref 0.61–1.24)
GFR calc Af Amer: >60 mL/min (ref >60)
GFR calc non Af Amer: >60 mL/min (ref >60)
Glucose, Bld: 335 mg/dL — ABNORMAL HIGH (ref 70–99)
Potassium: 3.8 mmol/L (ref 3.5–5.1)
Sodium: 139 mmol/L (ref 135–145)

## 2020-04-27 LAB — MAGNESIUM: Magnesium: 1.9 mg/dL (ref 1.7–2.4)

## 2020-04-27 MED ORDER — INSULIN ASPART 100 UNIT/ML ~~LOC~~ SOLN
16.0000 [IU] | Freq: Three times a day (TID) | SUBCUTANEOUS | Status: DC
  Administered 2020-04-27 – 2020-04-28 (×2): 16 [IU] via SUBCUTANEOUS
  Filled 2020-04-27 (×2): qty 1

## 2020-04-27 MED ORDER — INSULIN GLARGINE 100 UNIT/ML ~~LOC~~ SOLN
42.0000 [IU] | Freq: Every day | SUBCUTANEOUS | Status: DC
  Administered 2020-04-28: 42 [IU] via SUBCUTANEOUS
  Filled 2020-04-27 (×2): qty 0.42

## 2020-04-27 NOTE — Progress Notes (Signed)
PROGRESS NOTE    Spencer [name]   FFM:[id number]  DOB: 1961-08-27  PCP: No primary care provider on file.    DOA: 03/13/2020 LOS: 45    Patient denies any complaints.  Sugars a bit better Assessment & Plan    59 year old male with history of uncontrolled type 2 diabetes mellitus, cocaine use, brain aneurysm, hypertension, GERD recently hospitalized on 4/28 for DKA, MSSA bacteremia and AKI but signed out AMA on 5/5 was brought by EMS with hyperglycemia and encephalopathy.      MSSA bacteremia, persistent - Blood culture positive on 4/29 and (5/8) positive for MSSA.  -2D echo unremarkable. - Repeat blood culture on 5/10 and 5/27  negative. --pt will be on  IV nafcillin and rifampin  till July 8th -deep surgical Cultures from 6/11--so far negative -Completed course of prednisone taper on 6/12.  - pain meds prn (oxycodone 10 mg Q 4 hrs prn)  Acute pulmonary edema - resolved Unclear etiology.  2D echo from 5/8 with normal EF and no valvular abnormality. Repeated 2D echo on 6/1 which did not show any new valvular issues, EF 55-60%.    Minimally displaced C4 fracture and now new displaced fractures at C1-2 -Recommendation to wear Aspen collar at all times but patient does not wear it when he is in bed --continue low dose gabapentin   Cervical and thoracic spine osteomyelitis -Seen on MRI of the cervical spine from 5/27: Showed progressive perivertebral soft tissue swelling with concern for osteomyelitis of C1-C5, T2 and T3 vertebral bodies.  Also has ventral epidural phlegmon extending from C2-C5 and dorsal epidural phlegmon extending from C3-C4-6 6-C7, without discrete epidural abscess, high-grade stenosis or cord signal abnormality and acute to subacute mild T3 superior endplate compression fracture. --ID recommends total 4 weeks pf  IV antibiotic as above - s/p Anterior cervical washout and debridement with removal of Anterior cervical instrumentation at C5-7 & C4-5 disc biopsy by  Neurosurgery on 6/11 - Drain removed on 6/13 - Maintain ASPEN collar at all times.  Diabetes mellitus type 2, poorly controlled with hyper and hypoglycemia -A1c of 12.6.  -Presented with recurrent DKA which has resolved.  --Currently on Lantus with Premeal aspart and sliding scale coverage --dose adjusted -changed to po carb controlled diet  Essential hypertension - on amlodipine and losartan. PRN hydralazine.    H/o Ongoing cocaine use -Urine drug screen positive this admission.   - On as needed clonazepam   Acute kidney injury Prerenal secondary to DKA.  Resolved.  Issues with decision-making capacity Seen by psychiatry multiple times this admission and has capacity.   Cannot be involuntarily committed if he decides to leave AMA.  Depression/Anxiety Continue Klonopin, Cymbalta, Zyprexa  GERD Continue Pepcid   Patient BMI: Body mass index is 22.24 kg/m.   DVT prophylaxis: enoxaparin (LOVENOX) injection 40 mg Start: 04/20/20 2200 SCDs Start: 03/13/20 1848     Code Status: Full Code  Status is: Inpatient  Remains inpatient appropriate because: pt needs to complete IV abx  Dispo: The patient is from: Home              Anticipated d/c is to: Home              Anticipated d/c date is: > 3 days (planned final day abx 7/8)              Patient currently- is stable for d/c but needs to finish IV Abx while inpt. ID recommends total 4 weeks of IV  antibiotics till 7/8 Per TOC no offers yet barrier to discharge Medicaid and drug abuse     Consults, Procedures, Significant Events   Consultants:   Neurosurgery  Infectious Disease  Cardiology  Procedures:   MRI Cervical Spine  2D Echo  Antimicrobials:   IV Nafcillin  5/21 >>    Objective   Vitals:   04/26/20 1117 04/26/20 2054 04/27/20 0456 04/27/20 1142  BP: (!) 148/99 131/89 (!) 141/99 (!) 139/95  Pulse: 97 80 81 91  Resp: 16 20 20 18   Temp: 98.4 F (36.9 C) 98.2 F (36.8 C) (!) 97.3 F (36.3  C) 97.7 F (36.5 C)  TempSrc: Oral  Oral   SpO2: 100% 99% 97% 96%  Weight:      Height:        Intake/Output Summary (Last 24 hours) at 04/27/2020 1451 Last data filed at 04/27/2020 0900 Gross per 24 hour  Intake 480 ml  Output 1500 ml  Net -1020 ml   Filed Weights   03/13/20 1654 03/13/20 2055 04/01/20 2230  Weight: 59 kg 60.3 kg 70.3 kg    Physical Exam:  General exam: awake, alert, no acute distress Respiratory system: CTAB, no wheezes, rales or rhonchi, normal respiratory effort. Cardiovascular system: normal S1/S2, RRR, no pedal edema.   Neurologic: left upper extremity weak, unable to keep raised against gravity, intact grip strength.  Other extremities with motor intact.  No spasticity noted. Extremities: pain with passive ROM in left shoulder flexion and abduction, no peripheral edema   Labs   Data Reviewed: I have personally reviewed following labs and imaging studies  CBC: Recent Labs  Lab 04/21/20 0410  WBC 7.1  HGB 10.9*  HCT 33.3*  MCV 93.3  PLT 321   Basic Metabolic Panel: Recent Labs  Lab 04/21/20 0410 04/27/20 0447  NA 139 139  K 3.8 3.8  CL 107 108  CO2 26 25  GLUCOSE 304* 335*  BUN 21 26*  CREATININE 0.77 0.77  CALCIUM 8.0* 8.3*  MG  --  1.9   GFR: Estimated Creatinine Clearance: 96.4 mL/min (by C-G formula based on SCr of 0.77 mg/dL). Liver Function Tests: Recent Labs  Lab 04/21/20 1641  AST 17  ALT 19  ALKPHOS 88  BILITOT 1.0  PROT 5.4*  ALBUMIN 2.0*   No results for input(s): LIPASE, AMYLASE in the last 168 hours. No results for input(s): AMMONIA in the last 168 hours. Coagulation Profile: No results for input(s): INR, PROTIME in the last 168 hours. Cardiac Enzymes: No results for input(s): CKTOTAL, CKMB, CKMBINDEX, TROPONINI in the last 168 hours. BNP (last 3 results) No results for input(s): PROBNP in the last 8760 hours. HbA1C: No results for input(s): HGBA1C in the last 72 hours. CBG: Recent Labs  Lab  04/26/20 1118 04/26/20 1642 04/26/20 2103 04/27/20 0739 04/27/20 1138  GLUCAP 362* 197* 156* 283* 172*   Lipid Profile: No results for input(s): CHOL, HDL, LDLCALC, TRIG, CHOLHDL, LDLDIRECT in the last 72 hours. Thyroid Function Tests: No results for input(s): TSH, T4TOTAL, FREET4, T3FREE, THYROIDAB in the last 72 hours. Anemia Panel: No results for input(s): VITAMINB12, FOLATE, FERRITIN, TIBC, IRON, RETICCTPCT in the last 72 hours. Sepsis Labs: No results for input(s): PROCALCITON, LATICACIDVEN in the last 168 hours.  No results found for this or any previous visit (from the past 240 hour(s)).    Imaging Studies   No results found.   Medications   Scheduled Meds: . amLODipine  10 mg Oral Daily  .  aspirin EC  81 mg Oral Daily  . chlorhexidine  15 mL Mouth Rinse BID  . Chlorhexidine Gluconate Cloth  6 each Topical Q0600  . clonazePAM  0.5 mg Oral BID  . DULoxetine  30 mg Oral Daily  . enoxaparin (LOVENOX) injection  40 mg Subcutaneous Q24H  . famotidine  20 mg Oral Daily  . feeding supplement (GLUCERNA SHAKE)  237 mL Oral BID BM  . gabapentin  300 mg Oral BID  . haloperidol  0.25 mg Oral BID  . insulin aspart  0-5 Units Subcutaneous QHS  . insulin aspart  0-9 Units Subcutaneous TID WC  . insulin aspart  16 Units Subcutaneous TID WC  . [START ON 04/28/2020] insulin glargine  42 Units Subcutaneous Daily  . losartan  100 mg Oral Daily  . mouth rinse  15 mL Mouth Rinse q12n4p  . methocarbamol  500 mg Oral TID  . multivitamin with minerals  1 tablet Oral Daily  . OLANZapine  5 mg Oral QHS  . polyethylene glycol  17 g Oral Daily   Continuous Infusions: . sodium chloride 20.8 mL/hr at 04/01/20 0938  . nafcillin (NAFCIL) continuous infusion 12 g (04/27/20 1011)       LOS: 45 days    Time spent: 20 minutes    Ronnell Clinger,MD [facility]  04/27/2020, 2:51 PM

## 2020-04-27 NOTE — Plan of Care (Signed)
Patient refused wearing neck brace while ambulating. Neck brace at bedside. Glucose 89 and stated it makes him feel funny.  Crackers given and insulin held. Patient states not sure if he will eat meal for dinner.  Will continue to monitor.

## 2020-04-27 NOTE — TOC Progression Note (Signed)
Transition of Care [facility]) - Progression Note    Patient Details  Name: DELNO BLAISDELL MRN: [id number] Date of Birth: Apr 06, 1961  [facility]) CM/SW Contact  Chapman Fitch, RN Phone Number: 04/27/2020, 4:12 PM  Clinical Narrative:     Velna Hatchet at [facility] discussing case with DON   Expected Discharge Plan: Home/Self Care Barriers to Discharge: Continued Medical Work up  Expected Discharge Plan and Services Expected Discharge Plan: Home/Self Care In-house Referral: Clinical Social Work Discharge Planning Services: CM Consult   Living arrangements for the past 2 months: Single Family Home                 DME Arranged: Walker rolling DME Agency: [employer] Date DME Agency Contacted: 03/18/20 Time DME Agency Contacted: 1100 Representative spoke with at DME Agency: Zack             Social Determinants of Health (SDOH) Interventions    Readmission Risk Interventions Readmission Risk Prevention Plan 04/07/2020 03/05/2020 02/16/2020  Transportation Screening Complete Complete Complete  PCP or Specialist Appt within 3-5 Days - - -  HRI or Home Care Consult - - -  HRI or Home Care Consult comments - - -  Palliative Care Screening - - -  Medication Review (RN Care Manager) Complete Complete Complete  PCP or Specialist appointment within 3-5 days of discharge - Patient refused Complete  HRI or Home Care Consult - Complete Complete  SW Recovery Care/Counseling Consult - - -  SW Consult Not Complete Comments - - -  Palliative Care Screening Not Applicable - -  Skilled Nursing Facility Not Applicable Complete Complete  Some recent data might be hidden

## 2020-04-28 LAB — GLUCOSE, CAPILLARY
Glucose-Capillary: 118 mg/dL — ABNORMAL HIGH (ref 70–99)
Glucose-Capillary: 114 mg/dL — ABNORMAL HIGH (ref 70–99)
Glucose-Capillary: 279 mg/dL — ABNORMAL HIGH (ref 70–99)

## 2020-04-28 LAB — SARS CORONAVIRUS 2 BY RT PCR (HOSPITAL ORDER, PERFORMED IN ~~LOC~~ HOSPITAL LAB): SARS Coronavirus 2: NEGATIVE

## 2020-04-28 MED ORDER — HYDROCODONE-ACETAMINOPHEN 5-325 MG PO TABS: 1.0000 | ORAL_TABLET | Freq: Three times a day (TID) | ORAL | 0 refills | Status: AC | PRN

## 2020-04-28 MED ORDER — LOSARTAN POTASSIUM 100 MG PO TABS: 100.0000 mg | ORAL_TABLET | Freq: Every day | ORAL | 0 refills | Status: AC

## 2020-04-28 MED ORDER — INSULIN GLARGINE 100 UNIT/ML ~~LOC~~ SOLN: 42.0000 [IU] | Freq: Every day | SUBCUTANEOUS | 11 refills | Status: AC

## 2020-04-28 MED ORDER — FAMOTIDINE 20 MG PO TABS: 20.0000 mg | ORAL_TABLET | Freq: Every day | ORAL | 0 refills | Status: AC

## 2020-04-28 MED ORDER — GLUCERNA SHAKE PO LIQD: 237.0000 mL | Freq: Two times a day (BID) | ORAL | 0 refills | Status: AC

## 2020-04-28 MED ORDER — CLONAZEPAM 0.5 MG PO TABS: 0.5000 mg | ORAL_TABLET | Freq: Two times a day (BID) | ORAL | 0 refills | Status: AC

## 2020-04-28 MED ORDER — POLYETHYLENE GLYCOL 3350 17 G PO PACK: 17.0000 g | PACK | Freq: Every day | ORAL | 0 refills | Status: AC

## 2020-04-28 MED ORDER — DULOXETINE HCL 30 MG PO CPEP: 30.0000 mg | ORAL_CAPSULE | Freq: Every day | ORAL | 0 refills | Status: AC

## 2020-04-28 MED ORDER — NAFCILLIN IV (FOR PTA / DISCHARGE USE ONLY): 12.0000 g | INTRAVENOUS | 0 refills | Status: AC

## 2020-04-28 MED ORDER — HALOPERIDOL 0.5 MG PO TABS: 0.2500 mg | ORAL_TABLET | Freq: Two times a day (BID) | ORAL | 0 refills | Status: AC

## 2020-04-28 MED ORDER — GABAPENTIN 600 MG PO TABS: 300.0000 mg | ORAL_TABLET | Freq: Two times a day (BID) | ORAL | 0 refills | Status: AC

## 2020-04-28 MED ORDER — ADULT MULTIVITAMIN W/MINERALS CH: 1.0000 | ORAL_TABLET | Freq: Every day | ORAL | 0 refills | Status: AC

## 2020-04-28 MED ORDER — OLANZAPINE 5 MG PO TABS: 5.0000 mg | ORAL_TABLET | Freq: Every day | ORAL | 0 refills | Status: AC

## 2020-04-28 MED ORDER — INSULIN ASPART 100 UNIT/ML ~~LOC~~ SOLN: 16.0000 [IU] | Freq: Three times a day (TID) | SUBCUTANEOUS | 11 refills | Status: AC

## 2020-04-28 MED ORDER — CEPHALEXIN 500 MG PO CAPS: 500.0000 mg | ORAL_CAPSULE | Freq: Four times a day (QID) | ORAL | 0 refills | Status: AC

## 2020-04-28 NOTE — Progress Notes (Signed)
PHARMACY CONSULT NOTE FOR:  OUTPATIENT  PARENTERAL ANTIBIOTIC THERAPY (OPAT)  Indication: MSSA bacteremia and cervical discitis Regimen: Nafcillin 12gm infused as continuous infusion over 24 hr End date: 05/13/2020 (then start cephalexin 500mg  PO q6h after completion of nafcillin. Duration to cephalexin to be determined by infectious diseases  IV antibiotic discharge orders are pended. To discharging provider:  please sign these orders via discharge navigator,  Select New Orders & click on the button choice - Manage This Unsigned Work.     Thank you for allowing pharmacy to be a part of this patient's care.  , PharmD, BCPS.   Work Cell: [phone] 04/28/2020 10:41 AM

## 2020-04-28 NOTE — TOC Transition Note (Signed)
Transition of Care [facility]) - CM/SW Discharge Note   Patient Details  Name: Spencer [name] MRN: [id number] Date of Birth: [birth year]  Transition of Care [facility]) CM/SW Contact:  Chapman Fitch, RN Phone Number: 04/28/2020, 3:23 PM   Clinical Narrative:    RNCM confirmed bed offer from [facility] Presented offer to patient.  He has accepted.  Patient to discharge today  EMS called Bedside RN has called before DC info sent in HUB Repeat covid negative  Sister [name] notified   Final next level of care: Skilled Nursing Facility Barriers to Discharge: No Barriers Identified   Patient Goals and CMS Choice Patient states their goals for this hospitalization and ongoing recovery are:: Go home and feel better   Choice offered to / list presented to : Patient  Discharge Placement              Patient chooses bed at: [facility] Patient to be transferred to facility by: EMS Name of family member notified: [name] Patient and family notified of of transfer: 04/28/20  Discharge Plan and Services In-house Referral: Clinical Social Work Discharge Planning Services: CM Consult            DME Arranged: Dan Humphreys rolling DME Agency: [employer] Date DME Agency Contacted: 03/18/20 Time DME Agency Contacted: 1100 Representative spoke with at DME Agency: Zack            Social Determinants of Health (SDOH) Interventions     Readmission Risk Interventions Readmission Risk Prevention Plan 04/07/2020 03/05/2020 02/16/2020  Transportation Screening Complete Complete Complete  PCP or Specialist Appt within 3-5 Days - - -  HRI or Home Care Consult - - -  HRI or Home Care Consult comments - - -  Palliative Care Screening - - -  Medication Review (RN Care Manager) Complete Complete Complete  PCP or Specialist appointment within 3-5 days of discharge - Patient refused Complete  HRI or Home Care Consult - Complete Complete  SW Recovery Care/Counseling Consult - - -  SW Consult Not  Complete Comments - - -  Palliative Care Screening Not Applicable - -  Skilled Nursing Facility Not Applicable Complete Complete  Some recent data might be hidden

## 2020-04-28 NOTE — Treatment Plan (Signed)
Diagnosis: Staph aureus bacteremia with cervical osteomyeltis and discitis Baseline Creatinine <1   No Known Allergies  OPAT Orders Discharge antibiotics: Nafcillin 12 grams every  24 hours continuous infusion Until 05/13/20 After that he needs to be on oral cephalexin (Keflex ) 540m Q 6    PIC Care Per Protocol:  Labs weekly on Monday while on IV antibiotics: _X_ CBC with differential  _X_ CMP _X_ CRP _X_ ESR   _X_ Please pull PIC at completion of IV antibiotics  Fax weekly labs to Dr.Hesper [phone] Clinic Follow Up Appt:2 weeks    Call [phone] make appt

## 2020-04-28 NOTE — Progress Notes (Signed)
Spencer [name]  A and O x 4 VSS. Pt tolerating diet well. No complaints of pain or nausea. IV removed intact, prescriptions given. Pt voices understanding of discharge instructions with no further questions. Pt discharged via EMS.   Allergies as of 04/28/2020   No Known Allergies     Medication List    STOP taking these medications   atorvastatin 40 MG tablet Commonly known as: LIPITOR   insulin aspart 100 UNIT/ML FlexPen Commonly known as: NOVOLOG Replaced by: insulin aspart 100 UNIT/ML injection   insulin glargine 100 unit/mL Sopn Commonly known as: LANTUS Replaced by: insulin glargine 100 UNIT/ML injection   pantoprazole 40 MG tablet Commonly known as: PROTONIX     TAKE these medications   amLODipine 10 MG tablet Commonly known as: NORVASC Take 1 tablet (10 mg total) by mouth daily.   aspirin EC 81 MG tablet Take 1 tablet (81 mg total) by mouth daily.   cephALEXin 500 MG capsule Commonly known as: KEFLEX Take 1 capsule (500 mg total) by mouth 4 (four) times daily. Start cephalexin after completion of Nafcillin.  Dr Delaine Lame with infectious diseases to determine length of therapy for cephalexin Start taking on: May 14, 2020   clonazePAM 0.5 MG tablet Commonly known as: KLONOPIN Take 1 tablet (0.5 mg total) by mouth 2 (two) times daily.   DULoxetine 30 MG capsule Commonly known as: CYMBALTA Take 1 capsule (30 mg total) by mouth daily. Start taking on: April 29, 2020   famotidine 20 MG tablet Commonly known as: PEPCID Take 1 tablet (20 mg total) by mouth daily.   feeding supplement (GLUCERNA SHAKE) Liqd Take 237 mLs by mouth 2 (two) times daily between meals.   gabapentin 600 MG tablet Commonly known as: NEURONTIN Take 0.5 tablets (300 mg total) by mouth 2 (two) times daily.   haloperidol 0.5 MG tablet Commonly known as: HALDOL Take 0.5 tablets (0.25 mg total) by mouth 2 (two) times daily.   HYDROcodone-acetaminophen 5-325 MG tablet Commonly known as:  NORCO/VICODIN Take 1 tablet by mouth every 8 (eight) hours as needed for severe pain.   insulin aspart 100 UNIT/ML injection Commonly known as: novoLOG Inject 16 Units into the skin 3 (three) times daily with meals. Replaces: insulin aspart 100 UNIT/ML FlexPen   insulin glargine 100 UNIT/ML injection Commonly known as: LANTUS Inject 0.42 mLs (42 Units total) into the skin daily. Start taking on: April 29, 2020 Replaces: insulin glargine 100 unit/mL Sopn   losartan 100 MG tablet Commonly known as: COZAAR Take 1 tablet (100 mg total) by mouth daily. Start taking on: April 29, 2020 What changed:   medication strength  how much to take   multivitamin with minerals Tabs tablet Take 1 tablet by mouth daily. Start taking on: April 29, 2020   nafcillin  IVPB Inject 12 g into the vein continuous for 15 days. Infuse Nafcillin 12gm over 24hr as continuous infusion Indication: MSSA bacteremia and discitis First Dose: Yes Last Day of Therapy:  05/13/2020 Labs - Once weekly:  CBC/D, CMP,  ESR and CRP   OLANZapine 5 MG tablet Commonly known as: ZYPREXA Take 1 tablet (5 mg total) by mouth at bedtime.   polyethylene glycol 17 g packet Commonly known as: MIRALAX / GLYCOLAX Take 17 g by mouth daily. Start taking on: April 29, 2020            Home Infusion Instuctions  (From admission, onward)         Start  Ordered   04/28/20 0000  Home infusion instructions       Question:  Instructions  Answer:  Flushing of vascular access device: 0.9% NaCl pre/post medication administration and prn patency; Heparin 100 u/ml, 63m for implanted ports and Heparin 10u/ml, 576mfor all other central venous catheters.   04/28/20 11[place](From admission, onward)         Start     Ordered   03/18/20 1336  For home use only DME Walker rolling  Once       Question Answer Comment  Walker: With 5 Inch Wheels   Patient needs a walker to treat with the following  condition Weakness      03/18/20 1336           Discharge Care Instructions  (From admission, onward)         Start     Ordered   04/28/20 0000  Discharge wound care:       Comments: Keep incision dry   04/28/20 1131          Vitals:   04/28/20 1201 04/28/20 1402  BP: (!) 148/86 (!) 137/102  Pulse: 83 (!) 108  Resp: 16 16  Temp:  97.8 F (36.6 C)  SpO2:  100%    Lason Eveland LePayton Mccallum

## 2020-04-28 NOTE — Discharge Instructions (Signed)
Wear Aspen Collar all the times except showering  remove PICC line once IV antibiotic course is finished on May 13, 2020

## 2020-04-28 NOTE — Discharge Summary (Signed)
[facility] [place] NAME: Spencer [name]    MR#:  [id number]  DATE OF BIRTH:  06-Oct-1961  DATE OF ADMISSION:  03/13/2020 ADMITTING PHYSICIAN: Jenkins Rouge, MD  DATE OF DISCHARGE: 04/28/2020  PRIMARY CARE PHYSICIAN: No primary care provider on file.    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 Brain aneurysm   . Broken bones    clavical, ankle, arms toes wriast   . Diabetes mellitus without complication ([place])   . Dysrhythmia   . GERD (gastroesophageal reflux disease)   . Hypertension   . Substance abuse [facility])     HOSPITAL COURSE:   59 year old male with history of uncontrolled type 2 diabetes mellitus, cocaine use, brain aneurysm, hypertension, GERD recently hospitalized on 4/28 for DKA, MSSA bacteremia and AKI but signed out AMA on 5/5 was brought by EMS with hyperglycemia and encephalopathy.   MSSA bacteremia -Blood culture positive on 4/29 and (5/8) positive for MSSA.  -2D echo unremarkable. - Repeat blood culture on 5/10 and 5/27  negative. --pt will be on  IV nafcillin and rifampin  till July 8th-- and po keflex from 05/14/20 as ordered -deep surgical Cultures from 6/11--so far negative -Completed course of prednisone taper on 6/12.  - pain meds prn  Minimally displaced C4 fracture and now new displaced fractures at C1-2 -Recommendation to wear Aspen collar at all times but patient does not wear itwhen he is in bed --continue low dose gabapentin    Cervical and thoracic spine osteomyelitis -Seen on MRI of the cervical spine from 5/27: Showed progressive perivertebral soft tissue swelling with concern for osteomyelitis of C1-C5, T2 and T3 vertebral bodies. Also has ventral epidural phlegmon extending from C2-C5 and dorsal epidural phlegmon extending from C3-C4-6 6-C7, without discrete epidural abscess, high-grade stenosis or cord signal abnormality and acute to subacute mild T3 superior endplate compression fracture. --ID recommends total 4 weeks pf  IV antibiotic as above - s/p Anteriorcervical washout and debridement with removal ofAnterior cervical instrumentation at C5-7 & C4-5 disc biopsy by Neurosurgery on 6/11 - Drain removed on 6/13 - Maintain ASPEN collar at all times.  OUTPATIENT  PARENTERAL ANTIBIOTIC THERAPY (OPAT) per ID  Indication: MSSA bacteremia and cervical discitis Regimen: Nafcillin 12gm infused as continuous infusion over 24 hr End date: 05/13/2020 (then start cephalexin 534m PO q6h after completion of nafcillin from 05/14/2020-- Duration of cephalexin to be determined by infectious diseases   Diabetes mellitus type 2, poorly controlled with hyper and hypoglycemia -A1c of 12.6.  -Presented with recurrent DKA which has resolved.  --Currently on Lantus with Premeal aspart and sliding scale coverage --dose adjusted -cont carb controlled diet  Essential hypertension -on amlodipine and losartan. PRN hydralazine.    Acute pulmonary edema - resolved Unclear etiology. 2D echo from 5/8 with normal EF and no valvular abnormality. Repeated 2D echo on 6/1 which did not show any new valvular issues, EF 55-60%.    H/o Ongoing cocaine use -Urine drug screen positive this admission.  - On as needed clonazepam   Acute kidney injury Prerenal secondary to DKA. Resolved.  Issues  with decision-making capacity Seen by psychiatry multiple times this admission and has capacity.  Cannot be involuntarily committed  if he decides to leave AMA.  Depression/Anxiety Continue Klonopin, Cymbalta, Zyprexa  GERD Continue Pepcid   Patient BMI: Body mass index is 22.24 kg/m.   DVT prophylaxis: enoxaparin (LOVENOX) injection 40 mg Start: 04/20/20 2200 SCDs Start: 03/13/20 1848     Code Status: Full Code  Status is: Inpatient  Remains inpatient appropriate because: pt needs to complete IV abx  Dispo: The patient is from: Home  Anticipated d/c is to: Home  Anticipated d/c date is: today Per TOCpatient has been offered better long-term facility in [place]. Patient is agreeable to discharge today. Repeat COVID sent.  CONSULTS OBTAINED:  Treatment Team:  Corey Skains, MD  DRUG ALLERGIES:  No Known Allergies  DISCHARGE MEDICATIONS:   Allergies as of 04/28/2020   No Known Allergies     Medication List    STOP taking these medications   atorvastatin 40 MG tablet Commonly known as: LIPITOR   insulin aspart 100 UNIT/ML FlexPen Commonly known as: NOVOLOG Replaced by: insulin aspart 100 UNIT/ML injection   insulin glargine 100 unit/mL Sopn Commonly known as: LANTUS Replaced by: insulin glargine 100 UNIT/ML injection   pantoprazole 40 MG tablet Commonly known as: PROTONIX     TAKE these medications   amLODipine 10 MG tablet Commonly known as: NORVASC Take 1 tablet (10 mg total) by mouth daily.   aspirin EC 81 MG tablet Take 1 tablet (81 mg total) by mouth daily.   cephALEXin 500 MG capsule Commonly known as: KEFLEX Take 1 capsule (500 mg total) by mouth 4 (four) times daily. Start cephalexin after completion of Nafcillin.  Dr Delaine Lame with infectious diseases to determine length of therapy for cephalexin Start taking on: May 14, 2020   clonazePAM 0.5 MG tablet Commonly known as: KLONOPIN Take 1 tablet (0.5 mg total) by mouth 2 (two) times daily.   DULoxetine 30 MG capsule Commonly known as: CYMBALTA Take 1 capsule (30 mg  total) by mouth daily. Start taking on: April 29, 2020   famotidine 20 MG tablet Commonly known as: PEPCID Take 1 tablet (20 mg total) by mouth daily.   feeding supplement (GLUCERNA SHAKE) Liqd Take 237 mLs by mouth 2 (two) times daily between meals.   gabapentin 600 MG tablet Commonly known as: NEURONTIN Take 0.5 tablets (300 mg total) by mouth 2 (two) times daily.   haloperidol 0.5 MG tablet Commonly known as: HALDOL Take 0.5 tablets (0.25 mg total) by mouth 2 (two) times daily.   HYDROcodone-acetaminophen 5-325 MG tablet Commonly known as: NORCO/VICODIN Take 1 tablet by mouth every 8 (eight) hours as needed for severe pain.   insulin aspart 100 UNIT/ML injection Commonly known as: novoLOG Inject 16 Units into the skin 3 (three) times daily with meals. Replaces: insulin aspart 100 UNIT/ML FlexPen   insulin glargine 100 UNIT/ML injection Commonly known as: LANTUS Inject 0.42 mLs (42 Units total) into the skin daily. Start taking on: April 29, 2020 Replaces: insulin glargine 100 unit/mL Sopn   losartan 100 MG tablet Commonly known as: COZAAR Take 1 tablet (100 mg total) by mouth daily. Start taking on: April 29, 2020 What changed:   medication strength  how much to take   multivitamin with minerals Tabs tablet Take 1 tablet by mouth daily. Start taking on: April 29, 2020   nafcillin  IVPB Inject 12 g into the vein continuous for 15 days. Infuse Nafcillin 12gm  over 24hr as continuous infusion Indication: MSSA bacteremia and discitis First Dose: Yes Last Day of Therapy:  05/13/2020 Labs - Once weekly:  CBC/D, CMP,  ESR and CRP   OLANZapine 5 MG tablet Commonly known as: ZYPREXA Take 1 tablet (5 mg total) by mouth at bedtime.   polyethylene glycol 17 g packet Commonly known as: MIRALAX / GLYCOLAX Take 17 g by mouth daily. Start taking on: April 29, 2020            Home Infusion Instuctions  (From admission, onward)         Start     Ordered   04/28/20  0000  Home infusion instructions       Question:  Instructions  Answer:  Flushing of vascular access device: 0.9% NaCl pre/post medication administration and prn patency; Heparin 100 u/ml, 56m for implanted ports and Heparin 10u/ml, 564mfor all other central venous catheters.   04/28/20 11[place](From admission, onward)         Start     Ordered   03/18/20 1336  For home use only DME Walker rolling  Once       Question Answer Comment  Walker: With 5 Inch Wheels   Patient needs a walker to treat with the following condition Weakness      03/18/20 1336           Discharge Care Instructions  (From admission, onward)         Start     Ordered   04/28/20 0000  Discharge wound care:       Comments: Keep incision dry   04/28/20 1131          If you experience worsening of your admission symptoms, develop shortness of breath, life threatening emergency, suicidal or homicidal thoughts you must seek medical attention immediately by calling 911 or calling your MD immediately  if symptoms less severe.  You Must read complete instructions/literature along with all the possible adverse reactions/side effects for all the Medicines you take and that have been prescribed to you. Take any new Medicines after you have completely understood and accept all the possible adverse reactions/side effects.   Please note  You were cared for by a hospitalist during your hospital stay. If you have any questions about your discharge medications or the care you received while you were in the hospital after you are discharged, you can call the unit and asked to speak with the hospitalist on call if the hospitalist that took care of you is not available. Once you are discharged, your primary care physician will handle any further medical issues. Please note that NO REFILLS for any discharge medications will be authorized once you are discharged, as it is imperative that  you return to your primary care physician (or establish a relationship with a primary care physician if you do not have one) for your aftercare needs so that they can reassess your need for medications and monitor your lab values. Today   SUBJECTIVE   No new complaints  VITAL SIGNS:  Blood pressure (!) 148/97, pulse 80, temperature 98.5 F (36.9 C), temperature source Oral, resp. rate 16, height 5' 10"  (1.778 m), weight 70.3 kg, SpO2 99 %.  I/O:    Intake/Output Summary (Last 24 hours) at 04/28/2020 1134 Last data filed at 04/27/2020 2350 Gross per 24 hour  Intake 677.33 ml  Output 1150 ml  Net -472.67 ml    PHYSICAL EXAMINATION:  GENERAL:  59 y.o.-year-old patient lying in the bed with no acute distress.  EYES: Pupils equal, round, reactive to light and accommodation. No scleral icterus.  HEENT: Head atraumatic, normocephalic. Oropharynx and nasopharynx clear.  NECK:  Supple, no jugular venous distention. No thyroid enlargement, no tenderness. Neck collar + LUNGS: Normal breath sounds bilaterally, no wheezing, rales,rhonchi or crepitation. No use of accessory muscles of respiration.  CARDIOVASCULAR: S1, S2 normal. No murmurs, rubs, or gallops.  ABDOMEN: Soft, non-tender, non-distended. Bowel sounds present. No organomegaly or mass.  EXTREMITIES: No pedal edema, cyanosis, or clubbing.  NEUROLOGIC: Cranial nerves II through XII are intact. Muscle strength 5/5 in all extremities. Sensation intact. Gait not checked.  PSYCHIATRIC: The patient is alert and oriented x 3.  SKIN: No obvious rash, lesion, or ulcer.   DATA REVIEW:   CBC  No results for input(s): WBC, HGB, HCT, PLT in the last 168 hours.  Chemistries  Recent Labs  Lab 04/21/20 1641 04/27/20 0447  NA  --  139  K  --  3.8  CL  --  108  CO2  --  25  GLUCOSE  --  335*  BUN  --  26*  CREATININE  --  0.77  CALCIUM  --  8.3*  MG  --  1.9  AST 17  --   ALT 19  --   ALKPHOS 88  --   BILITOT 1.0  --      Microbiology Results   No results found for this or any previous visit (from the past 240 hour(s)).  RADIOLOGY:  No results found.   CODE STATUS:     Code Status Orders  (From admission, onward)         Start     Ordered   03/13/20 1847  Full code  Continuous        03/13/20 1853        Code Status History    Date Active Date Inactive Code Status Order ID Comments User Context   03/03/2020 1619 03/10/2020 0710 Full Code [id number]  Jennye Boroughs, MD ED   02/13/2020 2218 02/17/2020 2157 DNR [id number]  Bradly Bienenstock, NP Inpatient   02/13/2020 1736 02/13/2020 2218 Full Code [id number]  Flora Lipps, MD ED   12/15/2019 1214 12/17/2019 1852 Full Code [id number]  Para Skeans, MD ED   11/26/2019 0048 11/28/2019 1901 Full Code [id number]  Mansy, Arvella Merles, MD ED   10/24/2019 1530 10/26/2019 1841 Full Code [id number]  Sidney Ace, MD ED   09/12/2019 2047 09/15/2019 1648 Full Code [id number]  Mansy, Arvella Merles, MD ED   09/04/2019 1801 09/10/2019 1648 Full Code [id number]  Yolonda Kida, MD Inpatient   09/04/2019 1748 09/04/2019 1801 Full Code [id number]  Loletha Grayer, MD Inpatient   08/19/2019 0551 08/21/2019 1813 Full Code [id number]  Mansy, Arvella Merles, MD ED   08/07/2019 0221 08/08/2019 1802 Full Code [id number]  Lance Coon, MD ED   07/27/2019 1922 07/29/2019 2245 Full Code [id number]  Otila Back, MD ED   06/09/2019 2125 06/12/2019 2007 Full Code [id number]  Mayer Camel, NP ED   05/28/2019 1845 06/04/2019 2148 Full Code [id number]  Epifanio Lesches, MD ED   02/19/2019 0122 02/19/2019 1901 Full Code [id number]  Lance Coon, MD Inpatient   02/08/2019 0130 02/13/2019 1546 Full Code [id number]  Harrie Foreman, MD ED   01/17/2019 0412 01/18/2019 1903 Full Code [id number]  Mansy, Arvella Merles, MD  ED   01/06/2019 1729 01/08/2019 1427 Full Code [id number]  Loletha Grayer, MD ED   10/25/2018 1356 10/27/2018 1345 Full Code [id number]  Dustin Flock, MD Inpatient   10/23/2018 1828 10/24/2018 1759 Full Code [id number]   Dustin Flock, MD Inpatient   01/15/2017 1417 01/18/2017 1716 Full Code [id number]  Gladstone Lighter, MD Inpatient   11/06/2016 0319 11/09/2016 1508 Full Code [id number]  Roswell Nickel, MD Inpatient   11/06/2016 0229 11/06/2016 0319 Full Code [id number]  de Flo Shanks, MD Inpatient   04/04/2015 0008 04/06/2015 1418 Full Code [id number]  Phillips Grout, MD Inpatient   Advance Care Planning Activity       TOTAL TIME TAKING CARE OF THIS PATIENT: *40 minutes.    Fritzi Mandes M.D  [facility]    CC: Primary care physician; No primary care provider on file.   [facility] - [place] [facility]

## 2020-05-06 ENCOUNTER — Other Ambulatory Visit: Payer: Self-pay

## 2020-05-06 ENCOUNTER — Emergency Department (HOSPITAL_COMMUNITY)
Admission: EM | Admit: 2020-05-06 | Discharge: 2020-05-07 | Disposition: A | Discharge status: Home / Self Care | Payer: Medicaid Other | Attending: Emergency Medicine | Admitting: Emergency Medicine

## 2020-05-06 ENCOUNTER — Encounter (HOSPITAL_COMMUNITY): Payer: Self-pay | Admitting: Emergency Medicine

## 2020-05-06 DIAGNOSIS — Z7982 Long term (current) use of aspirin: Secondary | ICD-10-CM | POA: Insufficient documentation

## 2020-05-06 DIAGNOSIS — Z951 Presence of aortocoronary bypass graft: Secondary | ICD-10-CM | POA: Diagnosis not present

## 2020-05-06 DIAGNOSIS — I10 Essential (primary) hypertension: Secondary | ICD-10-CM | POA: Diagnosis not present

## 2020-05-06 DIAGNOSIS — Z794 Long term (current) use of insulin: Secondary | ICD-10-CM | POA: Insufficient documentation

## 2020-05-06 DIAGNOSIS — F17200 Nicotine dependence, unspecified, uncomplicated: Secondary | ICD-10-CM | POA: Diagnosis not present

## 2020-05-06 DIAGNOSIS — E1140 Type 2 diabetes mellitus with diabetic neuropathy, unspecified: Secondary | ICD-10-CM | POA: Diagnosis not present

## 2020-05-06 DIAGNOSIS — Z79899 Other long term (current) drug therapy: Secondary | ICD-10-CM | POA: Insufficient documentation

## 2020-05-06 DIAGNOSIS — M4622 Osteomyelitis of vertebra, cervical region: Secondary | ICD-10-CM | POA: Diagnosis present

## 2020-05-06 DIAGNOSIS — M869 Osteomyelitis, unspecified: Secondary | ICD-10-CM

## 2020-05-06 NOTE — ED Triage Notes (Signed)
Pateint arrives via EMS from a MD office, originally from [facility]. Sent here for PICC line insertion for osteomyelitis.

## 2020-05-06 NOTE — ED Notes (Signed)
Report called to [facility] nursing facility. Spoke with Autumn.

## 2020-05-06 NOTE — ED Notes (Signed)
[facility] called for transport back to patients facility 

## 2020-05-06 NOTE — ED Provider Notes (Signed)
[facility] DEPT Provider Note   CSN: [id number] Arrival date & time: 05/06/20  1850     History No chief complaint on file.   Spencer [name] is a 59 y.o. male.  Pt presents to the ED today with "needing a PICC line."  Pt has a hx of cervical osteomyelitis and disciitis.  He had an anterior cervical washout and debridement by NS on 6/11 and is recommended to be on IV abx until 05/13/20 and then transition to oral keflex on 7/9.  Pt said his SNF told him that he had to come in this evening. He feels fine.         Past Medical History:  Diagnosis Date  . Brain aneurysm   . Broken bones    clavical, ankle, arms toes wriast   . Diabetes mellitus without complication ([place])   . Dysrhythmia   . GERD (gastroesophageal reflux disease)   . Hypertension   . Substance abuse [facility])     Patient Active Problem List   Diagnosis Date Noted  . MSSA bacteremia 03/14/2020  . Cigarette smoker 03/14/2020  . Pressure injury of skin 03/14/2020  . C4 cervical fracture ([place]) 03/05/2020  . Atrial fibrillation with RVR ([place]) 08/07/2019  . GERD (gastroesophageal reflux disease) 08/07/2019  . AKI (acute kidney injury) ([place]) 02/19/2019  . Diabetic neuropathy ([place]) 01/16/2017  . DM2 (diabetes mellitus, type 2) ([place]) 01/16/2017  . History of cocaine abuse ([place]) 01/16/2017  . Personal history of subdural hematoma 01/16/2017  . Protein-calorie malnutrition, severe 11/08/2016  . DKA (diabetic ketoacidoses) ([place]) 04/03/2015  . Hypertension 04/03/2015  . Depression 04/03/2015  . Opiate dependence ([place]) 04/03/2015  . Type II or unspecified type diabetes mellitus without mention of complication, not stated as uncontrolled 07/19/2014  . Hernia of flank 09/20/2012    Past Surgical History:  Procedure Laterality Date  . CERVICAL WOUND DEBRIDEMENT N/A 04/16/2020   Procedure: washout of prevertebral abscess, disc biopsy, removal of anterior instrumentation;  Surgeon: Meade Maw, MD;  Location: [facility] ORS;  Service: Neurosurgery;  Laterality: N/A;  . CORONARY/GRAFT ACUTE MI REVASCULARIZATION N/A 09/04/2019   Procedure: Coronary/Graft Acute MI Revascularization;  Surgeon: Yolonda Kida, MD;  Location: [place] CV LAB;  Service: Cardiovascular;  Laterality: N/A;  . ESOPHAGOGASTRODUODENOSCOPY (EGD) WITH PROPOFOL N/A 06/04/2019   Procedure: ESOPHAGOGASTRODUODENOSCOPY (EGD) WITH PROPOFOL;  Surgeon: Toledo, Benay Pike, MD;  Location: [facility] ENDOSCOPY;  Service: Gastroenterology;  Laterality: N/A;  . HERNIA REPAIR    . IMPLANTATION / PLACEMENT OF STRIP ELECTRODES VIA BURR HOLES SUBDURAL     anyrusum  . LEFT HEART CATH AND CORONARY ANGIOGRAPHY N/A 09/04/2019   Procedure: LEFT HEART CATH AND CORONARY ANGIOGRAPHY;  Surgeon: Yolonda Kida, MD;  Location: [place] CV LAB;  Service: Cardiovascular;  Laterality: N/A;  . NECK SURGERY    . TEE WITHOUT CARDIOVERSION N/A 06/03/2019   Procedure: TRANSESOPHAGEAL ECHOCARDIOGRAM (TEE);  Surgeon: Teodoro Spray, MD;  Location: [facility] ORS;  Service: Cardiovascular;  Laterality: N/A;       Family History  Problem Relation Age of Onset  . CAD Sister   . Hypertension Sister   . Healthy Mother   . Healthy Father     Social History   Tobacco Use  . Smoking status: Current Some Day Smoker  . Smokeless tobacco: Never Used  Vaping Use  . Vaping Use: Never used  Substance Use Topics  . Alcohol use: Yes    Alcohol/week: 1.0 standard drink  Types: 1 Cans of beer per week  . Drug use: Not Currently    Types: Cocaine    Home Medications Prior to Admission medications   Medication Sig Start Date End Date Taking? Authorizing Provider  amLODipine (NORVASC) 10 MG tablet Take 1 tablet (10 mg total) by mouth daily. 02/18/20   Ezekiel Slocumb, DO  aspirin EC 81 MG tablet Take 1 tablet (81 mg total) by mouth daily. 10/26/19 05/13/20  Sidney Ace, MD  cephALEXin (KEFLEX) 500 MG capsule Take 1 capsule (500 mg  total) by mouth 4 (four) times daily. Start cephalexin after completion of Nafcillin.  Dr Delaine Lame with infectious diseases to determine length of therapy for cephalexin 05/14/20 06/13/20  Fritzi Mandes, MD  clonazePAM (KLONOPIN) 0.5 MG tablet Take 1 tablet (0.5 mg total) by mouth 2 (two) times daily. 04/28/20   Fritzi Mandes, MD  DULoxetine (CYMBALTA) 30 MG capsule Take 1 capsule (30 mg total) by mouth daily. 04/29/20   Fritzi Mandes, MD  famotidine (PEPCID) 20 MG tablet Take 1 tablet (20 mg total) by mouth daily. 04/28/20   Fritzi Mandes, MD  feeding supplement, GLUCERNA SHAKE, (GLUCERNA SHAKE) LIQD Take 237 mLs by mouth 2 (two) times daily between meals. 04/28/20   Fritzi Mandes, MD  gabapentin (NEURONTIN) 600 MG tablet Take 0.5 tablets (300 mg total) by mouth 2 (two) times daily. 04/28/20   Fritzi Mandes, MD  haloperidol (HALDOL) 0.5 MG tablet Take 0.5 tablets (0.25 mg total) by mouth 2 (two) times daily. 04/28/20   Fritzi Mandes, MD  HYDROcodone-acetaminophen (NORCO/VICODIN) 5-325 MG tablet Take 1 tablet by mouth every 8 (eight) hours as needed for severe pain. 04/28/20   Fritzi Mandes, MD  insulin aspart (NOVOLOG) 100 UNIT/ML injection Inject 16 Units into the skin 3 (three) times daily with meals. 04/28/20   Fritzi Mandes, MD  insulin glargine (LANTUS) 100 UNIT/ML injection Inject 0.42 mLs (42 Units total) into the skin daily. 04/29/20   Fritzi Mandes, MD  losartan (COZAAR) 100 MG tablet Take 1 tablet (100 mg total) by mouth daily. 04/29/20   Fritzi Mandes, MD  Multiple Vitamin (MULTIVITAMIN WITH MINERALS) TABS tablet Take 1 tablet by mouth daily. 04/29/20   Fritzi Mandes, MD  nafcillin IVPB Inject 12 g into the vein continuous for 15 days. Infuse Nafcillin 12gm over 24hr as continuous infusion Indication: MSSA bacteremia and discitis First Dose: Yes Last Day of Therapy:  05/13/2020 Labs - Once weekly:  CBC/D, CMP,  ESR and CRP 04/28/20 05/13/20  Fritzi Mandes, MD  OLANZapine (ZYPREXA) 5 MG tablet Take 1 tablet (5 mg total) by mouth  at bedtime. 04/28/20   Fritzi Mandes, MD  polyethylene glycol (MIRALAX / GLYCOLAX) 17 g packet Take 17 g by mouth daily. 04/29/20   Fritzi Mandes, MD    Allergies    Patient has no known allergies.  Review of Systems   Review of Systems  All other systems reviewed and are negative.   Physical Exam Updated Vital Signs BP (!) 121/92 (BP Location: Left Arm)   Pulse 91   Temp 98.8 F (37.1 C) (Oral)   Resp 17   Ht _0  (1.778 m)   Wt 70.3 kg   SpO2 100%   BMI 22.24 kg/m   Physical Exam Vitals and nursing note reviewed.  Constitutional:      Appearance: Normal appearance.  HENT:     Head: Normocephalic and atraumatic.     Right Ear: External ear normal.     Left Ear:  External ear normal.     Nose: Nose normal.     Mouth/Throat:     Mouth: Mucous membranes are moist.     Pharynx: Oropharynx is clear.  Eyes:     Extraocular Movements: Extraocular movements intact.     Conjunctiva/sclera: Conjunctivae normal.     Pupils: Pupils are equal, round, and reactive to light.  Cardiovascular:     Rate and Rhythm: Normal rate and regular rhythm.     Pulses: Normal pulses.     Heart sounds: Normal heart sounds.  Pulmonary:     Effort: Pulmonary effort is normal.     Breath sounds: Normal breath sounds.  Abdominal:     General: Abdomen is flat. Bowel sounds are normal.     Palpations: Abdomen is soft.  Musculoskeletal:        General: Normal range of motion.     Cervical back: Normal range of motion and neck supple.  Skin:    General: Skin is warm.     Capillary Refill: Capillary refill takes less than 2 seconds.  Neurological:     General: No focal deficit present.     Mental Status: He is alert and oriented to person, place, and time.  Psychiatric:        Mood and Affect: Mood normal.        Behavior: Behavior normal.     ED Results / Procedures / Treatments   Labs (all labs ordered are listed, but only abnormal results are displayed) Labs Reviewed - No data to  display  EKG None  Radiology No results found.  Procedures Procedures (including critical care time)  Medications Ordered in ED Medications - No data to display  ED Course  I have reviewed the triage vital signs and the nursing notes.  Pertinent labs & imaging results that were available during my care of the patient were reviewed by me and considered in my medical decision making (see chart for details).    MDM Rules/Calculators/A&P                          Peripheral IV in left forearm.  It flushes and drains well.  No indication for emergent PICC line tonight.  I did talk to Dr. Anselm Pancoast (IR).  He said to order the PICC line and his department will call pt's SNF to make an appt for the PICC line tomorrow.   Final Clinical Impression(s) / ED Diagnoses Final diagnoses:  Osteomyelitis of other site, unspecified type [facility])    Rx / DC Orders ED Discharge Orders         Ordered    IR PICC PLACEMENT RIGHT >5 YRS INC IMG GUIDE     Discontinue     05/06/20 1932           Isla Pence, MD 05/06/20 1939

## 2020-05-06 NOTE — Discharge Instructions (Signed)
Interventional Radiology will call the SNF tomorrow with a time for an APPOINTMENT for a PICC line placement.

## 2020-05-07 ENCOUNTER — Emergency Department (HOSPITAL_COMMUNITY)
Admission: RE | Admit: 2020-05-07 | Discharge: 2020-05-07 | Disposition: A | Discharge status: Home / Self Care | Payer: Medicaid Other | Source: Ambulatory Visit | Attending: Emergency Medicine | Admitting: Emergency Medicine

## 2020-05-07 ENCOUNTER — Emergency Department (HOSPITAL_COMMUNITY): Admission: RE | Admit: 2020-05-07 | Payer: Medicaid Other | Source: Ambulatory Visit

## 2020-05-07 ENCOUNTER — Emergency Department (HOSPITAL_COMMUNITY)
Admission: EM | Admit: 2020-05-07 | Discharge: 2020-05-07 | Disposition: A | Discharge status: Home / Self Care | Payer: Medicaid Other | Source: Home / Self Care | Attending: Emergency Medicine | Admitting: Emergency Medicine

## 2020-05-07 ENCOUNTER — Encounter (HOSPITAL_COMMUNITY): Payer: Self-pay

## 2020-05-07 DIAGNOSIS — Z452 Encounter for adjustment and management of vascular access device: Secondary | ICD-10-CM

## 2020-05-07 LAB — CBC
HCT: 41.2 % (ref 39.0–52.0)
Hemoglobin: 12.7 g/dL — ABNORMAL LOW (ref 13.0–17.0)
MCH: 30.8 pg (ref 26.0–34.0)
MCHC: 30.8 g/dL (ref 30.0–36.0)
MCV: 99.8 fL (ref 80.0–100.0)
Platelets: 325 10*3/uL (ref 150–400)
RBC: 4.13 MIL/uL — ABNORMAL LOW (ref 4.22–5.81)
RDW: 15.3 % (ref 11.5–15.5)
WBC: 5 10*3/uL (ref 4.0–10.5)
nRBC: 0 % (ref 0.0–0.2)

## 2020-05-07 LAB — BASIC METABOLIC PANEL
Anion gap: 11 (ref 5–15)
BUN: 20 mg/dL (ref 8–23)
CO2: 19 mmol/L — ABNORMAL LOW (ref 22–32)
Calcium: 8.6 mg/dL — ABNORMAL LOW (ref 8.9–10.3)
Chloride: 108 mmol/L (ref 98–111)
Creatinine, Ser: 1 mg/dL (ref 0.61–1.24)
GFR calc Af Amer: >60 mL/min (ref >60)
GFR calc non Af Amer: >60 mL/min (ref >60)
Glucose, Bld: 457 mg/dL — ABNORMAL HIGH (ref 70–99)
Potassium: 4.4 mmol/L (ref 3.5–5.1)
Sodium: 138 mmol/L (ref 135–145)

## 2020-05-07 LAB — CBG MONITORING, ED
Glucose-Capillary: 401 mg/dL — ABNORMAL HIGH (ref 70–99)
Glucose-Capillary: 387 mg/dL — ABNORMAL HIGH (ref 70–99)

## 2020-05-07 MED ORDER — INSULIN ASPART 100 UNIT/ML ~~LOC~~ SOLN
2.0000 [IU] | Freq: Once | SUBCUTANEOUS | Status: AC
  Administered 2020-05-07: 2 [IU] via SUBCUTANEOUS

## 2020-05-07 MED ORDER — LIDOCAINE HCL 1 % IJ SOLN
INTRAMUSCULAR | Status: DC | PRN
  Administered 2020-05-07: 2 mL

## 2020-05-07 MED ORDER — LIDOCAINE HCL 1 % IJ SOLN
INTRAMUSCULAR | Status: AC
  Filled 2020-05-07: qty 20

## 2020-05-07 MED ORDER — HEPARIN SOD (PORK) LOCK FLUSH 100 UNIT/ML IV SOLN
INTRAVENOUS | Status: AC
  Filled 2020-05-07: qty 5

## 2020-05-07 NOTE — ED Provider Notes (Signed)
[facility]EM[facility]ENC[facility]PT [facility] Emergency Department Provider Note MRN:  [id number]  Arrival date & time: 05/07/20     Chief Complaint   Vascular Access Problem   History of Present Illness   Spencer [name] is a 59 y.o. year-old male with a history of diabetes, hypertension, osteomyelitis presenting to the ED with chief complaint of vascular access problem.  Patient underwent PICC line placement today at [facility].  He is in the emergency department because he waited for hours but did not have a ride home.  He was brought to the [facility] via ambulance.  He lives at a assisted living facility.  He has no complaints at this time.  Denies chest pain or shortness of breath, no abdominal pain, no fever, no headache, no vision change, no numbness or weakness to the arms or legs.  Review of Systems  A complete 10 system review of systems was obtained and all systems are negative except as noted in the HPI and PMH.   Patient's Health History    Past Medical History:  Diagnosis Date  . Brain aneurysm   . Broken bones    clavical, ankle, arms toes wriast   . Diabetes mellitus without complication (HCC)   . Dysrhythmia   . GERD (gastroesophageal reflux disease)   . Hypertension   . Substance abuse [facility])     Past Surgical History:  Procedure Laterality Date  . CERVICAL WOUND DEBRIDEMENT N/A 04/16/2020   Procedure: washout of prevertebral abscess, disc biopsy, removal of anterior instrumentation;  Surgeon: Venetia Night, MD;  Location: [facility] ORS;  Service: Neurosurgery;  Laterality: N/A;  . CORONARY/GRAFT ACUTE MI REVASCULARIZATION N/A 09/04/2019   Procedure: Coronary/Graft Acute MI Revascularization;  Surgeon: Alwyn Pea, MD;  Location: [facility] INVASIVE CV LAB;  Service: Cardiovascular;  Laterality: N/A;  . ESOPHAGOGASTRODUODENOSCOPY (EGD) WITH PROPOFOL N/A 06/04/2019   Procedure: ESOPHAGOGASTRODUODENOSCOPY (EGD) WITH PROPOFOL;  Surgeon: Toledo, Boykin Nearing, MD;   Location: [facility] ENDOSCOPY;  Service: Gastroenterology;  Laterality: N/A;  . HERNIA REPAIR    . IMPLANTATION / PLACEMENT OF STRIP ELECTRODES VIA BURR HOLES SUBDURAL     anyrusum  . LEFT HEART CATH AND CORONARY ANGIOGRAPHY N/A 09/04/2019   Procedure: LEFT HEART CATH AND CORONARY ANGIOGRAPHY;  Surgeon: Alwyn Pea, MD;  Location: [facility] INVASIVE CV LAB;  Service: Cardiovascular;  Laterality: N/A;  . NECK SURGERY    . TEE WITHOUT CARDIOVERSION N/A 06/03/2019   Procedure: TRANSESOPHAGEAL ECHOCARDIOGRAM (TEE);  Surgeon: Dalia Heading, MD;  Location: [facility] ORS;  Service: Cardiovascular;  Laterality: N/A;    Family History  Problem Relation Age of Onset  . CAD Sister   . Hypertension Sister   . Healthy Mother   . Healthy Father     Social History   Socioeconomic History  . Marital status: Single    Spouse name: Not on file  . Number of children: Not on file  . Years of education: Not on file  . Highest education level: Not on file  Occupational History  . Not on file  Tobacco Use  . Smoking status: Current Some Day Smoker  . Smokeless tobacco: Never Used  Vaping Use  . Vaping Use: Never used  Substance and Sexual Activity  . Alcohol use: Yes    Alcohol/week: 1.0 standard drink    Types: 1 Cans of beer per week  . Drug use: Not Currently    Types: Cocaine  . Sexual activity: Not on file  Other Topics Concern  .  Not on file  Social History Narrative   ** Merged History Encounter **       Social Determinants of Health   Financial Resource Strain:   . Difficulty of Paying Living Expenses:   Food Insecurity:   . Worried About Programme researcher, broadcasting/film/video in the Last Year:   . Barista in the Last Year:   Transportation Needs:   . Freight forwarder (Medical):   Marland Kitchen Lack of Transportation (Non-Medical):   Physical Activity:   . Days of Exercise per Week:   . Minutes of Exercise per Session:   Stress:   . Feeling of Stress :   Social Connections:   . Frequency of  Communication with Friends and Family:   . Frequency of Social Gatherings with Friends and Family:   . Attends Religious Services:   . Active Member of Clubs or Organizations:   . Attends Banker Meetings:   Marland Kitchen Marital Status:   Intimate Partner Violence:   . Fear of Current or Ex-Partner:   . Emotionally Abused:   Marland Kitchen Physically Abused:   . Sexually Abused:      Physical Exam   Vitals:   05/07/20 1438 05/07/20 1742  BP: (!) 128/95 113/81  Pulse: 95 85  Resp: 18 15  Temp: 98.4 F (36.9 C)   SpO2: 100% 100%    CONSTITUTIONAL: Chronically ill-appearing, NAD NEURO:  Alert and oriented x 3, no focal deficits EYES:  eyes equal and reactive ENT/NECK:  no LAD, no JVD CARDIO: Regular rate, well-perfused, normal S1 and S2 PULM:  CTAB no wheezing or rhonchi GI/GU:  normal bowel sounds, non-distended, non-tender MSK/SPINE:  No gross deformities, no edema SKIN:  no rash, atraumatic; PICC line in place right upper arm PSYCH:  Appropriate speech and behavior  *Additional and/or pertinent findings included in MDM below  Diagnostic and Interventional Summary    EKG Interpretation  Date/Time:    Ventricular Rate:    PR Interval:    QRS Duration:   QT Interval:    QTC Calculation:   R Axis:     Text Interpretation:        Labs Reviewed  CBC - Abnormal; Notable for the following components:      Result Value   RBC 4.13 (*)    Hemoglobin 12.7 (*)    All other components within normal limits  BASIC METABOLIC PANEL - Abnormal; Notable for the following components:   CO2 19 (*)    Glucose, Bld 457 (*)    Calcium 8.6 (*)    All other components within normal limits  CBG MONITORING, ED - Abnormal; Notable for the following components:   Glucose-Capillary 401 (*)    All other components within normal limits  CBG MONITORING, ED - Abnormal; Notable for the following components:   Glucose-Capillary 387 (*)    All other components within normal limits    No orders to  display    Medications  insulin aspart (novoLOG) injection 2 Units (2 Units Subcutaneous Given 05/07/20 1851)     Procedures  /  Critical Care Procedures  ED Course and Medical Decision Making  I have reviewed the triage vital signs, the nursing notes, and pertinent available records from the EMR.  Listed above are laboratory and imaging tests that I personally ordered, reviewed, and interpreted and then considered in my medical decision making (see below for details).      It seems that there is a transportation breakdown  and so patient came to the emergency department for assistance.  Now that he is a patient of the emergency department, we will call [facility] for transport.  He has no acute complaints, his vitals are normal, his PICC line is well-placed and nontender.  Mild hyperglycemia which is improving with small dose of subcutaneous insulin.  Appropriate for discharge.    Elmer Sow. Pilar Plate, MD [facility] Emergency Medicine [facility] [email or web address]  Final Clinical Impressions(s) / ED Diagnoses     ICD-10-CM   1. Needs peripherally inserted central catheter (PICC)  Z45.2     ED Discharge Orders    None       Discharge Instructions Discussed with and Provided to Patient:     Discharge Instructions     You were evaluated in the Emergency Department and after careful evaluation, we did not find any emergent condition requiring admission or further testing in the hospital.  Your exam/testing today is overall reassuring.  Your PICC line was successfully placed.  Please return to the Emergency Department if you experience any worsening of your condition.  We encourage you to follow up with a primary care provider.  Thank you for allowing [place] to be a part of your care.      Sabas Sous, MD 05/07/20 Nicholos Johns

## 2020-05-07 NOTE — Procedures (Signed)
PROCEDURE SUMMARY:  Successful placement of image-guided single lumen PICC line to the right basilic vein. Length 37 cm. Tip at lower SVC/RA. No complications. EBL < 5 mL. Ready for use.  Please see imaging section of Epic for full dictation.   Gordy Councilman Savon Cobbs PA-C 05/07/2020 4:09 PM

## 2020-05-07 NOTE — ED Triage Notes (Addendum)
Pt arrives to ED from [facility] stating he needs PICC line placed for cervical osteomyelitis. Pt arrives w/ IV to L forearm which he states has been there for 3 months. C-collar stabilization in place on arrival to ED. Pt's CBG w/ EMS 527. EMS VSS. Pt seen at [facility] yesterday for the same, however, no PICC line placed.

## 2020-05-07 NOTE — Discharge Instructions (Addendum)
You were evaluated in the Emergency Department and after careful evaluation, we did not find any emergent condition requiring admission or further testing in the hospital.  Your exam/testing today is overall reassuring.  Your PICC line was successfully placed.  Please return to the Emergency Department if you experience any worsening of your condition.  We encourage you to follow up with a primary care provider.  Thank you for allowing [place] to be a part of your care.

## 2020-05-07 NOTE — ED Notes (Signed)
Found pts paperwork in a wheelchair. Called pt twice. No answer.

## 2020-05-07 NOTE — ED Notes (Signed)
Pt has returned tio ED

## 2020-05-17 ENCOUNTER — Telehealth: Payer: Self-pay

## 2020-05-17 NOTE — Telephone Encounter (Signed)
Scheduled televisit at [facility] in [place] [phone] / 7/15 at 1pm per Dr. Rivka Safer. Requested labs spoke to [facility]

## 2020-05-20 ENCOUNTER — Other Ambulatory Visit: Payer: Self-pay

## 2020-05-20 ENCOUNTER — Ambulatory Visit: Payer: Medicaid Other | Attending: Infectious Diseases | Admitting: Infectious Diseases

## 2020-05-20 DIAGNOSIS — M4620 Osteomyelitis of vertebra, site unspecified: Secondary | ICD-10-CM | POA: Diagnosis not present

## 2020-05-20 NOTE — Progress Notes (Addendum)
The purpose of this virtual visit is to provide medical care while limiting exposure to the novel coronavirus (COVID19) for both patient and office staff.   Consent was obtained for phone visit:  Yes.   Answered questions that patient had about telehealth interaction:  Yes.   I discussed the limitations, risks, security and privacy concerns of performing an evaluation and management service by telephone. I also discussed with the patient that there may be a patient responsible charge related to this service. The patient expressed understanding and agreed to proceed.   Patient Location: [place].  Call the patient cell phone number which is [phone] Provider Location:office  [name] [name] with staph aureus bacteremia and cervical osteomyelitis and discitis at multiple levels with removal of the prior ACDF hardware between c5-c7 on 04/16/20 was discharged on SNF on 04/28/20 to complete IV nafciillin infusion He completed IV nafcillin on 05/13/20.  He is doing better medical Hisotry  Spencer [name] is a 59 y.o. male with a history of diabetes mellitus with recurrent DKA, hypertension, cocaine use, MSSA bacteremia was recently in the hospital between 03/03/2020 until 03/10/2020 and left AMA.    Patient has had multiple hospitalizations since April 2021.  He was in the hospital between 02/13/2020 until 02/23/2020 for DKA, AKI, cocaine intoxication metabolic encephalopathy and respiratory failure and required intubation for a short.. After discharge he was again in the hospital between 4/28 until 03/10/2020 when he left AMA.  The last hospitalization was for a fall and he was found to have fracture of the anterior margin of the C4 vertebral body with minimal displacement.  There was also pre vertebral fluid seen at the site of the new fracture.  He was seen by neurosurgery and was advised in Aspen collar.  During that hospitalization he also was found to have staph aureus bacteremia from blood cultures on  03/04/2020. He cam back for the third time on 03/13/20 and was at [facility] until 04/28/20 for the cervical vertebrae infection TEE could not be done because of cervical spine issue He was on IV nafcillin- but because of persistent pain and weakness left arm repeat CT showed progressing infection inspite of improving ESR/CRP and neg blood cultures- after 5 weeks of IV nafcillin he had debridement of the cervical vertebral area and removal of the hardware. Cultures were negative from this surgery He received 4 more weeks of IV nafcillin which completed on 05/13/2020 in the nursing home. He supposed to be on Keflex.   Spoke to his nurse who said she is taking Keflex 500 mg every 6. Asked her to do CBC, CMP, ESR and CRP and fax results to me.  Depending on the test results I will decide on how long he will have to continue Keflex. Discussed the management with the patient and his nurse Total time on the call was 20 minutes  Addendum  ESR 33 CRP 8 Will continue for until sept 2021- repeat labs in 4 weeks

## 2020-07-23 ENCOUNTER — Encounter: Payer: Medicaid Other | Attending: Internal Medicine | Admitting: Dietician

## 2020-07-23 ENCOUNTER — Other Ambulatory Visit: Payer: Self-pay

## 2020-07-23 ENCOUNTER — Encounter: Payer: Self-pay | Admitting: Dietician

## 2020-07-23 VITALS — Ht 73.0 in | Wt 164.0 lb

## 2020-07-23 DIAGNOSIS — Z794 Long term (current) use of insulin: Secondary | ICD-10-CM | POA: Insufficient documentation

## 2020-07-23 DIAGNOSIS — E1165 Type 2 diabetes mellitus with hyperglycemia: Secondary | ICD-10-CM | POA: Insufficient documentation

## 2020-07-23 NOTE — Progress Notes (Signed)
Medical Nutrition Therapy: Visit start time: 1045  end time: 1215 Assessment:  Diagnosis: uncontrolled type 2 diabetes  Past medical history: none Psychosocial issues/ stress concerns: pt rates stress level as "high" and feels "poorly" about stress management skills   Preferred learning method:  . Auditory . Visual . Hands-on  Current weight: 164.0 lbs  Height: 6'1" Medications, supplements: reconciled in medical record  Tobacco use: 1/2 ppd  Progress and evaluation:   Pt reports checking BGs 2-3 times a day when he had strips, fasting 200s,300s; before meals 300s, 400s    A1c- 8.0 on 06/24/2020, 12.10 February 2020 per MD progress note   Sister reports there have been times that she had to call the ambulance for her brother 3 times in one month due to blood sugars dangerously high or low   Pt reports he never feels full, always hungry, always thirsty   Pt reports diarrhea/loose stools common   Sister of pt states she has not been able to get refill of supplies for insulin or testing materials for Accu-check  Prescription order present in EPIC  RN provided pt with samples and advised pt to reach out to pharmacy again   Physical activity: ADLs  Dietary Intake:  Usual eating pattern includes 3 meals and 2-3 snacks per day. Dining out frequency: 3 meals per week.  Breakfast: bowl of grits with eggs and sausage; bowl of cereal Snack: chips; crackers Lunch: vienna sausages with crackers and cheese;  Snack: chips; cupcakes Supper: steak/chicken/shrimp with mashed potatoes with gravy and salads(carrots, tomatoes, spinach)/mushrooms/cabbage/broccoli/asparagus  Snack: 2-3 cups of ice cream  Beverages: coke zero, 4-5 bottles of water, coffee with sweet and low, gatorade zero     Carbohydrate intake is high, lacks portion control, and snacks do not have protein source with carbs.  Fiber intake may not meet recommendation.  Saturated fat and sodium intake may exceed recommendations.    Nutrition Care Education: Diabetes:  goals for BGs, appropriate meal and snack schedule, appropriate carb intake and balance, healthy carb choices, role of fiber, protein, fat; role of exercise Heart health: identifying high sodium foods, healthy and unhealthy fats, role of fiber, role of exercise Other lifestyle changes:  benefits of making changes, readiness for change  Nutritional Diagnosis:  NB-1.1 Food and nutrition-related knowledge deficit As related to type 2 diabetes .  As evidenced by pt diet recall, pt report of understanding of food groups, pt reluctance to adhere to nutrition recommendations, pt report of BGs in 200s, 300s, and 400s as 'normal'. [place]-2.2 Altered nutrition-related laboratory As related to type 2 diabets.  As evidenced by A1c of 8.0 on 06/24/2020, pt report of BGs in 200s, 300s, and 400s as 'normal' .  Intervention:  Discussion and instruction as noted above.  Reviewed importance of taking medications as prescribed, timing of insulin, and consistent carb intake.  Sister of pt receptive to nutrition information and importance of making changes; pt reluctant to make changes.  Increase fruit and vegetable intake   Incorporate vegetables at lunch and dinner   Incorporate more F/V at snacks  Try frozen veggies that come in microwaveable pouches (may be tender than fresh and low prep time)  Try canned fruit NOT in heavy syrup (mandarin oranges, peaches, pears, applesauce) for snacks + protein (cheese, PB)   Decrease sugar-sweetened beverage consumption   Switch from sweet to unsweet tea with sugar substitute   Switch from regular to diet soda  Drink at least 64oz water daily (add crystal light, lemon,  or mio as needed)    Increase physical activity   Incrementally introduce exercise   150 minutes per week is recommendation   Try using stationary bike for 10-15 min   Increase fiber intake   Eat at least 3 servings of whole grains a day  Increase fruit and  vegetable intake  Decrease sodium intake   Reduce sodium intake to recommended 1500mg /day   Read nutrition labels on packaged foods to monitor sodium intake   400-600 mg/meal  <200 mg/snack   Decrease fast food frequency   Avoid processed meats    Decrease saturated fat intake   Try more plant-based sources of protein  Limit processed meats (vienna sausages, bacon, pork sausage)  Switch to low fat dairy products   Diabetes self management  Test blood sugars 3-4 times a day  Take insulin as prescribed   Do not skip meals   Start consistent, portion control with carbs  3-4 servings at meals  1-2 servings at snacks   Education Materials given:  . General diet guidelines for Diabetes . Food record . Plate Planner with food lists  . Food lists/ Planning A Balanced Meal . Goals/ instructions  Learner/ who was taught:  . Patient  . Family member: sister )  Level of understanding: . Sister- Verbalizes/ demonstrates competency . Patient- needs review  Demonstrated degree of understanding via:   Teach back Learning barriers: [name] Motivation lacking  Willingness to learn/ readiness for change: . Non-acceptance, not ready for change- pt expressed healthy food is too expensive and a reluctance to make changes  . However, sister of pt seems ready to help pt make positive changes, but he does not live with sister all the time   Monitoring and Evaluation:  Dietary intake, exercise, BGs, A1c, and body weight      follow up: prn

## 2020-09-06 DEATH — deceased
